# Patient Record
Sex: Male | Born: 1950
Health system: Southern US, Community
[De-identification: ages and names within clinical notes are randomized; demographics above are authoritative.]

## PROBLEM LIST (undated history)

## (undated) DIAGNOSIS — S88919A Complete traumatic amputation of unspecified lower leg, level unspecified, initial encounter: Secondary | ICD-10-CM

## (undated) DIAGNOSIS — E119 Type 2 diabetes mellitus without complications: Secondary | ICD-10-CM

## (undated) DIAGNOSIS — I629 Nontraumatic intracranial hemorrhage, unspecified: Secondary | ICD-10-CM

## (undated) DIAGNOSIS — Z89512 Acquired absence of left leg below knee: Secondary | ICD-10-CM

## (undated) DIAGNOSIS — I255 Ischemic cardiomyopathy: Secondary | ICD-10-CM

## (undated) DIAGNOSIS — N529 Male erectile dysfunction, unspecified: Secondary | ICD-10-CM

## (undated) DIAGNOSIS — I739 Peripheral vascular disease, unspecified: Secondary | ICD-10-CM

## (undated) DIAGNOSIS — I70211 Atherosclerosis of native arteries of extremities with intermittent claudication, right leg: Secondary | ICD-10-CM

## (undated) DIAGNOSIS — G546 Phantom limb syndrome with pain: Secondary | ICD-10-CM

## (undated) DIAGNOSIS — N183 Chronic kidney disease, stage 3 unspecified: Secondary | ICD-10-CM

## (undated) DIAGNOSIS — E785 Hyperlipidemia, unspecified: Secondary | ICD-10-CM

## (undated) DIAGNOSIS — K219 Gastro-esophageal reflux disease without esophagitis: Secondary | ICD-10-CM

## (undated) DIAGNOSIS — I10 Essential (primary) hypertension: Secondary | ICD-10-CM

## (undated) DIAGNOSIS — J449 Chronic obstructive pulmonary disease, unspecified: Secondary | ICD-10-CM

## (undated) DIAGNOSIS — I5042 Chronic combined systolic (congestive) and diastolic (congestive) heart failure: Secondary | ICD-10-CM

## (undated) DIAGNOSIS — E1122 Type 2 diabetes mellitus with diabetic chronic kidney disease: Secondary | ICD-10-CM

## (undated) DIAGNOSIS — F32A Depression, unspecified: Secondary | ICD-10-CM

## (undated) DIAGNOSIS — G709 Myoneural disorder, unspecified: Secondary | ICD-10-CM

## (undated) DIAGNOSIS — E1142 Type 2 diabetes mellitus with diabetic polyneuropathy: Secondary | ICD-10-CM

## (undated) DIAGNOSIS — I2510 Atherosclerotic heart disease of native coronary artery without angina pectoris: Secondary | ICD-10-CM

## (undated) HISTORY — DX: Chronic combined systolic (congestive) and diastolic (congestive) heart failure: I50.42

## (undated) HISTORY — DX: Ischemic cardiomyopathy: I25.5

## (undated) HISTORY — DX: Male erectile dysfunction, unspecified: N52.9

## (undated) HISTORY — DX: Essential (primary) hypertension: I10

## (undated) HISTORY — DX: Type 2 diabetes mellitus without complications: E11.9

## (undated) HISTORY — DX: Hyperlipidemia, unspecified: E78.5

## (undated) HISTORY — PX: BELOW KNEE LEG AMPUTATION: SUR23

## (undated) HISTORY — DX: Chronic kidney disease, stage 3 unspecified: N18.30

## (undated) HISTORY — DX: Chronic kidney disease, stage 3 (moderate): N18.3

## (undated) HISTORY — DX: Atherosclerotic heart disease of native coronary artery without angina pectoris: I25.10

## (undated) HISTORY — PX: OTHER SURGICAL HISTORY: SHX169

---
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
## 2003-12-21 ENCOUNTER — Other Ambulatory Visit: Payer: Self-pay

## 2007-09-29 ENCOUNTER — Emergency Department: Payer: Self-pay | Admitting: Emergency Medicine

## 2007-10-11 ENCOUNTER — Emergency Department: Payer: Self-pay | Admitting: Emergency Medicine

## 2009-12-23 ENCOUNTER — Emergency Department: Payer: Self-pay

## 2009-12-31 ENCOUNTER — Emergency Department: Payer: Self-pay | Admitting: Emergency Medicine

## 2010-01-11 ENCOUNTER — Ambulatory Visit: Payer: Self-pay | Admitting: Cardiovascular Disease

## 2010-01-11 ENCOUNTER — Inpatient Hospital Stay: Payer: Self-pay | Admitting: Student

## 2010-01-29 ENCOUNTER — Ambulatory Visit: Payer: Self-pay | Admitting: Cardiovascular Disease

## 2010-01-29 ENCOUNTER — Observation Stay: Payer: Self-pay | Admitting: Internal Medicine

## 2010-01-29 LAB — CONVERTED CEMR LAB
ALT: 21 units/L
AST: 11 units/L
Albumin: 3.8 g/dL
Alkaline Phosphatase: 77 units/L
BUN: 19 mg/dL
CO2: 26 meq/L
Calcium: 9 mg/dL
Chloride: 103 meq/L
Creatinine, Ser: 0.88 mg/dL
Glucose, Bld: 152 mg/dL
HCT: 45 %
Hemoglobin: 15 g/dL
MCV: 94 fL
Platelets: 264 10*3/uL
Potassium: 4.2 meq/L
RBC: 4.78 M/uL
RDW: 12.9 %
Sodium: 139 meq/L
Total Bilirubin: 0.3 mg/dL
Total Protein: 7.5 g/dL
WBC: 10.4 10*3/uL

## 2010-01-30 ENCOUNTER — Encounter: Payer: Self-pay | Admitting: Cardiovascular Disease

## 2010-02-08 ENCOUNTER — Encounter: Payer: Self-pay | Admitting: Cardiovascular Disease

## 2010-02-10 ENCOUNTER — Ambulatory Visit: Payer: Self-pay | Admitting: Cardiovascular Disease

## 2010-02-10 DIAGNOSIS — E785 Hyperlipidemia, unspecified: Secondary | ICD-10-CM | POA: Insufficient documentation

## 2010-02-10 DIAGNOSIS — I10 Essential (primary) hypertension: Secondary | ICD-10-CM | POA: Insufficient documentation

## 2010-02-10 DIAGNOSIS — I2510 Atherosclerotic heart disease of native coronary artery without angina pectoris: Secondary | ICD-10-CM | POA: Insufficient documentation

## 2010-02-15 ENCOUNTER — Telehealth: Payer: Self-pay | Admitting: Cardiovascular Disease

## 2010-02-27 ENCOUNTER — Encounter: Payer: Self-pay | Admitting: Cardiovascular Disease

## 2010-03-23 ENCOUNTER — Telehealth: Payer: Self-pay | Admitting: Cardiovascular Disease

## 2010-04-14 ENCOUNTER — Telehealth: Payer: Self-pay | Admitting: Cardiovascular Disease

## 2010-04-21 ENCOUNTER — Telehealth: Payer: Self-pay | Admitting: Cardiovascular Disease

## 2010-04-24 ENCOUNTER — Encounter: Payer: Self-pay | Admitting: Cardiovascular Disease

## 2010-06-05 ENCOUNTER — Ambulatory Visit: Payer: Self-pay | Admitting: Cardiovascular Disease

## 2010-06-05 DIAGNOSIS — E1165 Type 2 diabetes mellitus with hyperglycemia: Secondary | ICD-10-CM | POA: Insufficient documentation

## 2010-06-05 DIAGNOSIS — E1169 Type 2 diabetes mellitus with other specified complication: Secondary | ICD-10-CM | POA: Insufficient documentation

## 2010-06-05 DIAGNOSIS — E118 Type 2 diabetes mellitus with unspecified complications: Secondary | ICD-10-CM

## 2010-06-07 ENCOUNTER — Telehealth (INDEPENDENT_AMBULATORY_CARE_PROVIDER_SITE_OTHER): Payer: Self-pay | Admitting: *Deleted

## 2010-08-09 ENCOUNTER — Telehealth: Payer: Self-pay | Admitting: Cardiovascular Disease

## 2010-09-05 NOTE — Medication Information (Signed)
Summary: Tax adviser   Imported By: West Carbo 02/27/2010 09:30:59  _____________________________________________________________________  External Attachment:    Type:   Image     Comment:   External Document

## 2010-09-05 NOTE — Progress Notes (Signed)
Summary: EFFIENT  Phone Note Other Incoming Call back at [phone]   Caller: ISAAC [facility] Summary of Call: REF # [id number] IN REFERENCE TO THE PT REQUESTING EFFIENT Initial call taken by: Harlon Flor,  March 23, 2010 1:57 PM  Follow-up for Phone Call        [facility] needs proof of income and #of people in house hold to finish paperwork. Follow-up by: Benedict Needy, RN,  March 24, 2010 11:26 AM     Appended Document: EFFIENT spoke to rebecca pt's daughter she will fax the rest of the form. will also leave sample of effient at desk for pt to pick up   Clinical Lists Changes  Medications: Changed medication from PLAVIX 75 MG TABS (CLOPIDOGREL BISULFATE) 1 tab once daily to EFFIENT 10 MG TABS (PRASUGREL HCL) Take one tablet by mouth daily - Signed Rx of EFFIENT 10 MG TABS (PRASUGREL HCL) Take one tablet by mouth daily;  #7 x 0;  Signed;  Entered by: Benedict Needy, RN;  Authorized by: Dossie Arbour MD;  Method used: Samples Given    Prescriptions: EFFIENT 10 MG TABS (PRASUGREL HCL) Take one tablet by mouth daily  #7 x 0   Entered by:   Benedict Needy, RN   Authorized by:   Dossie Arbour MD   Signed by:   Benedict Needy, RN on 03/24/2010   Method used:   Samples Given   RxID:   [id number]

## 2010-09-05 NOTE — Progress Notes (Signed)
  Phone Note Other Incoming   Request: Send information Summary of Call: Request for records received from [facility]. Request forwarded to [facility].     

## 2010-09-05 NOTE — Assessment & Plan Note (Signed)
Summary: ROV/AMD   Visit Type:  Follow-up Primary Provider:  Dr. Lacie Scotts  CC:  c/o shortness of breath..  History of Present Illness: 60 year old gentleman with a history of coronary artery disease, cardiac catheterization by myself showing severe two-vessel disease, severe stenosis of the proximal to mid LAD, severe ostial O1 and O2 disease, moderate to severe mid left circumflex disease that appeared aneurysmal, moderate OM 2 disease ejection fraction 40% who had a PCI of the LAD on January 11, 2010, who used to smoke 4 packs a day who stopped earlier in 2011, history of diabetes, hyperlipidemia who presents for routine followup.  Overall he reports that he is doing well. He denies any chest pain. He has quit smoking earlier in the year and although it has been difficult, he has abstained. He does have some shortness of breath and Dr. Lacie Scotts has started him on inhalers which he believes has helped. He is now taking a cholesterol pill or a beta blocker though he is uncertain why these are not on his list.  he does report that after stopping smoking, his weight has increased 30 pounds.  EKG shows normal sinus rhythm with ST  and T wave abnormalities noted in V4 to V6, and inferior leads  Current Medications (verified): 1)  Lisinopril-Hydrochlorothiazide 20-25 Mg Tabs (Lisinopril-Hydrochlorothiazide) .[name].. 1 Tab Once Daily 2)  Imdur 30 Mg Xr24h-Tab (Isosorbide Mononitrate) .[name].. 1 Tab Once Daily 3)  Glipizide 5 Mg Xr24h-Tab (Glipizide) .[name].. 1 Tab Two Times A Day 4)  Effient 10 Mg Tabs (Prasugrel Hcl) .... Take One Tablet By Mouth Daily 5)  Metformin Hcl 1000 Mg Tabs (Metformin Hcl) .[name].. 1 Tab Two Times A Day 6)  Nitrostat 0.4 Mg Subl (Nitroglycerin) .[name].. 1 Tablet Under Tongue At Onset of Chest Pain; You May Repeat Every 5 Minutes For Up To 3 Doses. 7)  Spiriva Handihaler 18 Mcg Caps (Tiotropium Bromide Monohydrate) .... Inhale One Dose Everyday 8)  Ventolin Hfa 108 (90 Base) Mcg/act Aers  (Albuterol Sulfate) .... Inhale Two Puffs Every 4 To 6 Hours As Needed 9)  Qvar 80 Mcg/act Aers (Beclomethasone Dipropionate) .... Inhale Two Puffs Two Times A Day  Allergies (verified): No Known Drug Allergies  Review of Systems       The patient complains of dyspnea on exertion.  The patient denies fever, weight loss, weight gain, vision loss, decreased hearing, hoarseness, chest pain, syncope, peripheral edema, prolonged cough, abdominal pain, incontinence, muscle weakness, depression, and enlarged lymph nodes.    Vital Signs:  Patient profile:   60 year old male Height:      72 inches Weight:      234 pounds BMI:     31.85 Pulse rate:   88 / minute BP sitting:   168 / 91  (left arm) Cuff size:   regular  Vitals Entered By: Bishop Dublin, CMA (June 05, 2010 11:12 AM)  Physical Exam  General:  Well developed, well nourished, in no acute distress. Head:  normocephalic and atraumatic Neck:  Neck supple, no JVD. No masses, thyromegaly or abnormal cervical nodes. Lungs:  Clear bilaterally to auscultation and percussion. Heart:  Non-displaced PMI, chest non-tender; regular rate and rhythm, S1, S2 without murmurs, rubs or gallops. Carotid upstroke normal, no bruit Pedals pulses diminished. No edema, no varicosities. Abdomen:  Bowel sounds positive; abdomen soft and non-tender without masses. Obese. Msk:  Back normal, normal gait. Muscle strength and tone normal. Pulses:  decreased lower extremity pulses Extremities:  No clubbing or cyanosis. Neurologic:  Alert and oriented x 3. Skin:  Intact without lesions or rashes. Psych:  Normal affect.   Impression & Recommendations:  Problem # 1:  CAD (ICD-414.00) history of severe CAD with stent placement in his LAD, currently with no anginal symptoms We'll continue aggressive lipid management. We'll restart his simvastatin 40 mg daily. Also given him a prescription for Coreg 12.5 mg b.i.d.  The following medications were removed  from the medication list:    Coreg 25 Mg Tabs (Carvedilol) .[name]... 1 two times a day    Aspirin 325 Mg Tabs (Aspirin) .[name]... 1 tab once daily His updated medication list for this problem includes:    Lisinopril-hydrochlorothiazide 20-25 Mg Tabs (Lisinopril-hydrochlorothiazide) .[name]... 1 tab once daily    Imdur 30 Mg Xr24h-tab (Isosorbide mononitrate) .[name]... 1 tab once daily    Effient 10 Mg Tabs (Prasugrel hcl) .[name]... Take one tablet by mouth daily    Nitrostat 0.4 Mg Subl (Nitroglycerin) .[name]... 1 tablet under tongue at onset of chest pain; you may repeat every 5 minutes for up to 3 doses.    Carvedilol 12.5 Mg Tabs (Carvedilol) .[name]... Take one tablet by mouth twice a day  Problem # 2:  HYPERTENSION, BENIGN (ICD-401.1) Blood pressure on recheck continues to be elevated with systolics in the 150s. We will readd Coreg two times a day.  The following medications were removed from the medication list:    Coreg 25 Mg Tabs (Carvedilol) .[name]... 1 two times a day    Aspirin 325 Mg Tabs (Aspirin) .[name]... 1 tab once daily His updated medication list for this problem includes:    Lisinopril-hydrochlorothiazide 20-25 Mg Tabs (Lisinopril-hydrochlorothiazide) .[name]... 1 tab once daily    Carvedilol 12.5 Mg Tabs (Carvedilol) .[name]... Take one tablet by mouth twice a day  Problem # 3:  HYPERLIPIDEMIA-MIXED (ICD-272.4) restart simvastatin 40 mg daily. If his cholesterol in several months time is not at goal less than 70, we could change him to Lipitor when it goes generic.  The following medications were removed from the medication list:    Simvastatin 40 Mg Tabs (Simvastatin) .[name]... 1 tab once daily His updated medication list for this problem includes:    Simvastatin 40 Mg Tabs (Simvastatin) .[name]... Take one tablet by mouth daily at bedtime  Problem # 4:  DIAB W/O COMP TYPE II/UNS NOT STATED UNCNTRL (ICD-250.00) His sugars are poorly controlled which is dietary non-discretion. We have encouraged him to monitor his diet  more closely and avoid high carbohydrate foods.  The following medications were removed from the medication list:    Aspirin 325 Mg Tabs (Aspirin) .[name]... 1 tab once daily His updated medication list for this problem includes:    Lisinopril-hydrochlorothiazide 20-25 Mg Tabs (Lisinopril-hydrochlorothiazide) .[name]... 1 tab once daily    Glipizide 5 Mg Xr24h-tab (Glipizide) .[name]... 1 tab two times a day    Metformin Hcl 1000 Mg Tabs (Metformin hcl) .[name]... 1 tab two times a day  Patient Instructions: 1)  Your physician has recommended you make the following change in your medication: START coreg two times a day and simvastatin daily 2)  Your physician wants you to follow-up in:   6 months You will receive a reminder letter in the mail two months in advance. If you don't receive a letter, please call our office to schedule the follow-up appointment. Prescriptions: CARVEDILOL 12.5 MG TABS (CARVEDILOL) Take one tablet by mouth twice a day  #60 x 6   Entered by:   Benedict Needy, RN   Authorized  by:   Dossie Arbour MD   Signed by:   Benedict Needy, RN on 06/05/2010   Method used:   Print then Give to Patient   RxID:   [id number] SIMVASTATIN 40 MG TABS (SIMVASTATIN) Take one tablet by mouth daily at bedtime  #30 x 6   Entered by:   Benedict Needy, RN   Authorized by:   Dossie Arbour MD   Signed by:   Benedict Needy, RN on 06/05/2010   Method used:   Print then Give to Patient   RxID:   [id number]

## 2010-09-05 NOTE — Letter (Signed)
Summary: [facility] - No-Show Fax  [facility] - No-Show Fax   Imported By: Marylou Mccoy 05/10/2010 11:29:44  _____________________________________________________________________  External Attachment:    Type:   Image     Comment:   External Document

## 2010-09-05 NOTE — Progress Notes (Signed)
Summary: FYI  Phone Note Call from Patient Call back at [facility] Phone [phone]   Caller: Daughter Call For: [facility] Summary of Call: PT ASSISTANCE IS GOING TO SHIP A 4 MONTH SUPPLY OF EFFIENT TO OUR OFFICE FOR THE PT Initial call taken by: Harlon Flor,  April 14, 2010 8:52 AM

## 2010-09-05 NOTE — Letter (Signed)
Summary: Discharge Summary  Discharge Summary   Imported By: Park Breed 02/01/2010 08:57:01  _____________________________________________________________________  External Attachment:    Type:   Image     Comment:   External Document

## 2010-09-05 NOTE — Progress Notes (Signed)
Summary: Effient  Phone Note Outgoing Call   Call placed by: Benedict Needy, RN,  April 21, 2010 12:51 PM Call placed to: Patient Summary of Call: Called spoke to pt's daughter patient assistance effient arrived Initial call taken by: Benedict Needy, RN,  April 21, 2010 12:52 PM    Prescriptions: EFFIENT 10 MG TABS (PRASUGREL HCL) Take one tablet by mouth daily  #120 x 0   Entered and Authorized by:   Benedict Needy, RN   Signed by:   Benedict Needy, RN on 04/21/2010   Method used:   Historical   RxID:   [id number]

## 2010-09-05 NOTE — Assessment & Plan Note (Signed)
Summary: F/U [facility]   Visit Type:  Initial Consult Primary Provider:  [facility]  CC:  "Doing well"..  History of Present Illness: 60 year old gentleman with a history of coronary artery disease, cardiac catheterization by myself sewing severe two-vessel disease, severe stenosis of the proximal to mid LAD, severe ostial V1 and V2 disease, moderate to severe mid left circumflex disease that appeared aneurysmal, moderate OM 2 disease ejection fraction 40% who had a PCI of the LAD on January 11, 2010, who used to smoke 4 packs a day who stopped one month ago, history of diabetes, hyperlipidemia who presents for routine followup.  He reports no episodes of chest pain. He has not had to take any nitroglycerin. He is an active. He's been eating more to make up for his smoking cessation. He does not even carry his nitroglycerin with him as he has not had any chest discomfort. He has not been checking his sugars as he does not have a monitor with a right test strips. He denies any shortness of breath, lower extremity edema, lightheadedness or dizziness.  EKG shows normal sinus rhythm with ST abnormalities noted in V5 and V6, T wave abnormality in the anterolateral leads.  Current Medications (verified): 1)  Coreg 25 Mg Tabs (Carvedilol) .[name].. 1 Two Times A Day 2)  Lisinopril-Hydrochlorothiazide 20-25 Mg Tabs (Lisinopril-Hydrochlorothiazide) .[name].. 1 Tab Once Daily 3)  Imdur 30 Mg Xr24h-Tab (Isosorbide Mononitrate) .[name].. 1 Tab Once Daily 4)  Glipizide 5 Mg Xr24h-Tab (Glipizide) .[name].. 1 Tab Two Times A Day 5)  Aspirin 325 Mg Tabs (Aspirin) .[name].. 1 Tab Once Daily 6)  Plavix 75 Mg Tabs (Clopidogrel Bisulfate) .[name].. 1 Tab Once Daily 7)  Metformin Hcl 1000 Mg Tabs (Metformin Hcl) .[name].. 1 Tab Two Times A Day 8)  Simvastatin 40 Mg Tabs (Simvastatin) .[name].. 1 Tab Once Daily  Allergies (verified): No Known Drug Allergies  Past History:  Past Medical History: Diabetes Type 2 Hyperlipidemia Hypertension CAD mild non-Q-wave  MI  Past Surgical History: CAD: Stent to the LAD  Social History: Tobacco Use - Former. quit in June 2011 Alcohol Use - no Drug Use - no Single   Review of Systems  The patient denies fatigue, malaise, fever, weight gain/loss, vision loss, decreased hearing, hoarseness, chest pain, palpitations, shortness of breath, prolonged cough, wheezing, sleep apnea, coughing up blood, abdominal pain, blood in stool, nausea, vomiting, diarrhea, heartburn, incontinence, blood in urine, muscle weakness, joint pain, leg swelling, rash, skin lesions, headache, fainting, dizziness, depression, anxiety, enlarged lymph nodes, easy bruising or bleeding, environmental allergies, weight loss, weight gain, syncope, and dyspnea on exertion.    Vital Signs:  Patient profile:   60 year old male Height:      72 inches Weight:      211 pounds BMI:     28.72 Pulse rate:   93 / minute BP sitting:   151 / 96  (left arm) Cuff size:   regular  Vitals Entered By: Bishop Dublin, CMA (February 10, 2010 11:33 AM)  Physical Exam  General:  Well developed, well nourished, in no acute distress. Head:  normocephalic and atraumatic Neck:  Neck supple, no JVD. No masses, thyromegaly or abnormal cervical nodes. Lungs:  Clear bilaterally to auscultation and percussion. Heart:  Non-displaced PMI, chest non-tender; regular rate and rhythm, S1, S2 without murmurs, rubs or gallops. Carotid upstroke normal, no bruit Pedals pulses diminished. No edema, no varicosities. Abdomen:  Bowel sounds positive; abdomen soft and non-tender without masses Msk:  Back normal,  normal gait. Muscle strength and tone normal. Pulses:  decreased lower extremity pulses Extremities:  No clubbing or cyanosis. Neurologic:  Alert and oriented x 3. Skin:  Intact without lesions or rashes. Psych:  Normal affect.   Impression & Recommendations:  Problem # 1:  CAD (ICD-414.00) severe coronary artery disease of his LAD, left circumflex and diagonal as  well as marginal branches. Stent placed to his LAD one month ago. We have given him some samples of Plavix as he was due to run out. We are very happy that he has been able to stop smoking and have encouraged him to refrain from restarting. We have also encouraged him to take his nitroglycerin with him. If he has any additional episodes of chest pain requiring nitroglycerin, I have suggested that he call me.  His updated medication list for this problem includes:    Coreg 25 Mg Tabs (Carvedilol) .[name]... 1 two times a day    Lisinopril-hydrochlorothiazide 20-25 Mg Tabs (Lisinopril-hydrochlorothiazide) .[name]... 1 tab once daily    Imdur 30 Mg Xr24h-tab (Isosorbide mononitrate) .[name]... 1 tab once daily    Aspirin 325 Mg Tabs (Aspirin) .[name]... 1 tab once daily    Plavix 75 Mg Tabs (Clopidogrel bisulfate) .[name]... 1 tab once daily  Orders: EKG w/ Interpretation (93000)  Problem # 2:  HYPERLIPIDEMIA-MIXED (ICD-272.4) He is due to follow up at [facility] primary care. We'll try to obtain a lipid panel from their clinic when it is Checked.  His updated medication list for this problem includes:    Simvastatin 40 Mg Tabs (Simvastatin) .[name]... 1 tab once daily  Problem # 3:  HYPERTENSION, BENIGN (ICD-401.1) Blood pressure on recheck was 140 systolic over 75. We have not made any medication changes at this time.  His updated medication list for this problem includes:    Coreg 25 Mg Tabs (Carvedilol) .[name]... 1 two times a day    Lisinopril-hydrochlorothiazide 20-25 Mg Tabs (Lisinopril-hydrochlorothiazide) .[name]... 1 tab once daily    Aspirin 325 Mg Tabs (Aspirin) .[name]... 1 tab once daily

## 2010-09-05 NOTE — Progress Notes (Signed)
Summary: RX faxed  Phone Note Call from Patient   Caller: Daughter Reason for Call: Refill Medication Summary of Call: PT NEEDS PRES FOR NITRO FAXED TO [facility] [address]  HER PHONE NUMBER [phone] Initial call taken by: Park Breed,  February 15, 2010 11:08 AM  Follow-up for Phone Call        The pt's daughter is aware the RX has been called in. Follow-up by: Sherri Rad, RN, BSN,  February 15, 2010 5:56 PM    New/Updated Medications: NITROSTAT 0.4 MG SUBL (NITROGLYCERIN) 1 tablet under tongue at onset of chest pain; you may repeat every 5 minutes for up to 3 doses. Prescriptions: NITROSTAT 0.4 MG SUBL (NITROGLYCERIN) 1 tablet under tongue at onset of chest pain; you may repeat every 5 minutes for up to 3 doses.  #25 x 3   Entered by:   Sherri Rad, RN, BSN   Authorized by:   Dossie Arbour MD   Signed by:   Sherri Rad, RN, BSN on 02/15/2010   Method used:   Electronically to        [facility] [address] (retail)       [address], [address]       [place], [place]  [address]       Ph: [phone]       Fax: [phone]   RxID:   [id number]

## 2010-09-07 NOTE — Progress Notes (Signed)
  Phone Note Call from Patient   Caller: Daughter Details for Reason: Refills Summary of Call: Need a refill called in for lisinopril and effient to [facility] [address]. Initial call taken by: Bishop Dublin, CMA,  August 09, 2010 3:50 PM    Prescriptions: EFFIENT 10 MG TABS (PRASUGREL HCL) Take one tablet by mouth daily  #30 x 4   Entered by:   Bishop Dublin, CMA   Authorized by:   Dossie Arbour MD   Signed by:   Bishop Dublin, CMA on 08/09/2010   Method used:   Electronically to        [facility] [address] (retail)       [address], [address]       [place], [place]  [address]       Ph: [phone]       Fax: [phone]   RxID:   [id number] LISINOPRIL-HYDROCHLOROTHIAZIDE 20-25 MG TABS (LISINOPRIL-HYDROCHLOROTHIAZIDE) 1 tab once daily  #30 x 4   Entered by:   Bishop Dublin, CMA   Authorized by:   Dossie Arbour MD   Signed by:   Bishop Dublin, CMA on 08/09/2010   Method used:   Electronically to        [facility] [address] (retail)       [address], [address]       [place], [place]  [address]       Ph: [phone]       Fax: [phone]   RxID:   [id number]

## 2011-01-04 ENCOUNTER — Other Ambulatory Visit: Payer: Self-pay | Admitting: Cardiovascular Disease

## 2011-02-20 ENCOUNTER — Encounter: Payer: Self-pay | Admitting: Cardiovascular Disease

## 2011-03-19 ENCOUNTER — Encounter: Payer: Self-pay | Admitting: Cardiovascular Disease

## 2011-03-19 ENCOUNTER — Ambulatory Visit (INDEPENDENT_AMBULATORY_CARE_PROVIDER_SITE_OTHER): Payer: Medicaid Other | Admitting: Cardiovascular Disease

## 2011-03-19 VITALS — BP 153/96 | HR 86 | Ht 72.0 in | Wt 242.0 lb

## 2011-03-19 DIAGNOSIS — E119 Type 2 diabetes mellitus without complications: Secondary | ICD-10-CM

## 2011-03-19 DIAGNOSIS — E785 Hyperlipidemia, unspecified: Secondary | ICD-10-CM

## 2011-03-19 DIAGNOSIS — I10 Essential (primary) hypertension: Secondary | ICD-10-CM

## 2011-03-19 DIAGNOSIS — I2510 Atherosclerotic heart disease of native coronary artery without angina pectoris: Secondary | ICD-10-CM

## 2011-03-19 NOTE — Assessment & Plan Note (Signed)
Cholesterol is at goal on the current lipid regimen. No changes to the medications were made.  

## 2011-03-19 NOTE — Assessment & Plan Note (Signed)
We have encouraged continued exercise, careful diet management in an effort to lose weight. 

## 2011-03-19 NOTE — Assessment & Plan Note (Signed)
Repeat blood pressure was improved. We have asked him to monitor his blood pressure at home. He admits to high salt intake recently.

## 2011-03-19 NOTE — Progress Notes (Signed)
Patient ID: Danny Scrape Sr., male    DOB: [birth year], 60 y.o.   MRN: [id number]  HPI Comments: 60 year old gentleman with a history of coronary artery disease, cardiac catheterization showing severe two-vessel disease, severe stenosis of the proximal to mid LAD, severe ostial O1 and O2 disease, moderate to severe mid left circumflex disease that appeared aneurysmal, moderate OM 2 disease ejection fraction 40% who had a PCI of the LAD on January 11, 2010, who used to smoke 4 packs a day who stopped earlier in 2011, history of diabetes, hyperlipidemia who presents for routine followup.   Overall he reports that he is doing well. He denies any chest pain. He has quit smoking earlier in the year. Shortness of breath is better on inhaler.   He reports having an echocardiogram done with Dr. Carron Brazen office. Results are not available to [place].   EKG shows normal sinus rhythm with Rate of 91 beats per minute, consider old interseptal infarct, T-wave abnormality in lead I and aVL    Outpatient Encounter Prescriptions as of 03/19/2011  Medication Sig Dispense Refill  . albuterol (VENTOLIN HFA) 108 (90 BASE) MCG/ACT inhaler Inhale 2 puffs into the lungs. Every 4 to 6 hours as needed       . aspirin 81 MG tablet Take 81 mg by mouth daily.        . beclomethasone (QVAR) 80 MCG/ACT inhaler Inhale 2 puffs into the lungs 2 (two) times daily.        . carvedilol (COREG) 25 MG tablet Take 25 mg by mouth 2 (two) times daily with a meal.        . clonazePAM (KLONOPIN) 0.5 MG tablet Take 0.5 mg by mouth 2 (two) times daily as needed.        . colesevelam (WELCHOL) 625 MG tablet Take 1,875 mg by mouth 2 (two) times daily with a meal.        . glyBURIDE-metformin (GLUCOVANCE) 2.5-500 MG per tablet Take 1 tablet by mouth 2 (two) times daily with a meal.        . isosorbide mononitrate (IMDUR) 30 MG 24 hr tablet Take 30 mg by mouth daily.        [name] lisinopril-hydrochlorothiazide (PRINZIDE,ZESTORETIC) 20-25 MG per tablet  Take 1 tablet by mouth daily.        . nitroGLYCERIN (NITROSTAT) 0.4 MG SL tablet Place 0.4 mg under the tongue every 5 (five) minutes as needed.        . prasugrel (EFFIENT) 10 MG TABS Take by mouth daily.        . ranitidine (ZANTAC) 150 MG tablet Take 150 mg by mouth 2 (two) times daily.        . simvastatin (ZOCOR) 40 MG tablet Take 40 mg by mouth at bedtime.        [name] tiotropium (SPIRIVA HANDIHALER) 18 MCG inhalation capsule Place 18 mcg into inhaler and inhale daily.        . traMADol-acetaminophen (ULTRACET) 37.5-325 MG per tablet Take 1 tablet by mouth every 4 (four) hours as needed.           Review of Systems  Constitutional: Negative.   HENT: Negative.   Eyes: Negative.   Respiratory: Negative.   Cardiovascular: Negative.   Gastrointestinal: Negative.   Musculoskeletal: Negative.   Skin: Negative.   Neurological: Negative.   Hematological: Negative.   Psychiatric/Behavioral: Negative.   All other systems reviewed and are negative.    BP 153/96  Pulse 86  Ht 6' (1.829 m)  Wt 242 lb (109.77 kg)  BMI 32.82 kg/m2 Repeat blood pressure 140/85  Physical Exam  Nursing note and vitals reviewed. Constitutional: He is oriented to person, place, and time. He appears well-developed and well-nourished.       obese  HENT:  Head: Normocephalic.  Nose: Nose normal.  Mouth/Throat: Oropharynx is clear and moist.  Eyes: Conjunctivae are normal. Pupils are equal, round, and reactive to light.  Neck: Normal range of motion. Neck supple. No JVD present.  Cardiovascular: Normal rate, regular rhythm, S1 normal, S2 normal, normal heart sounds and intact distal pulses.  Exam reveals no gallop and no friction rub.   No murmur heard. Pulmonary/Chest: Effort normal and breath sounds normal. No respiratory distress. He has no wheezes. He has no rales. He exhibits no tenderness.  Abdominal: Soft. Bowel sounds are normal. He exhibits no distension. There is no tenderness.  Musculoskeletal:  Normal range of motion. He exhibits no edema and no tenderness.  Lymphadenopathy:    He has no cervical adenopathy.  Neurological: He is alert and oriented to person, place, and time. Coordination normal.  Skin: Skin is warm and dry. No rash noted. No erythema.  Psychiatric: He has a normal mood and affect. His behavior is normal. Judgment and thought content normal.           Assessment and Plan

## 2011-03-19 NOTE — Assessment & Plan Note (Signed)
Currently with no symptoms of angina. No further workup at this time. Continue current medication regimen. 

## 2011-03-19 NOTE — Patient Instructions (Signed)
You are doing well. No medication changes were made. Watch out!   Not too much bacon or salt! Please call us if you have new issues that need to be addressed before your next appt.  We will call you for a follow up Appt. In 6 months

## 2011-04-07 ENCOUNTER — Other Ambulatory Visit: Payer: Self-pay | Admitting: Cardiovascular Disease

## 2011-04-11 NOTE — Telephone Encounter (Signed)
Patient needs a cholesterol check ASAP!

## 2011-05-31 ENCOUNTER — Emergency Department: Payer: Self-pay | Admitting: Emergency Medicine

## 2011-06-07 ENCOUNTER — Other Ambulatory Visit: Payer: Self-pay | Admitting: Cardiovascular Disease

## 2011-06-08 ENCOUNTER — Telehealth: Payer: Self-pay

## 2011-06-08 MED ORDER — SIMVASTATIN 40 MG PO TABS: 40.0000 mg | ORAL_TABLET | Freq: Every day | ORAL | Status: DC

## 2011-06-08 NOTE — Telephone Encounter (Signed)
Refill sent for simvastatin 40 mg take one tablet daily at bedtime.

## 2011-10-17 ENCOUNTER — Ambulatory Visit (INDEPENDENT_AMBULATORY_CARE_PROVIDER_SITE_OTHER): Payer: Medicaid Other | Admitting: Cardiovascular Disease

## 2011-10-17 ENCOUNTER — Encounter: Payer: Self-pay | Admitting: Cardiovascular Disease

## 2011-10-17 VITALS — BP 152/87 | HR 91 | Ht 72.0 in | Wt 250.0 lb

## 2011-10-17 DIAGNOSIS — I2510 Atherosclerotic heart disease of native coronary artery without angina pectoris: Secondary | ICD-10-CM

## 2011-10-17 DIAGNOSIS — E785 Hyperlipidemia, unspecified: Secondary | ICD-10-CM

## 2011-10-17 DIAGNOSIS — R079 Chest pain, unspecified: Secondary | ICD-10-CM

## 2011-10-17 DIAGNOSIS — I10 Essential (primary) hypertension: Secondary | ICD-10-CM

## 2011-10-17 MED ORDER — CLOPIDOGREL BISULFATE 75 MG PO TABS: 75.0000 mg | ORAL_TABLET | Freq: Every day | ORAL | Status: AC

## 2011-10-17 MED ORDER — ISOSORBIDE MONONITRATE ER 30 MG PO TB24: 30.0000 mg | ORAL_TABLET | Freq: Two times a day (BID) | ORAL | Status: DC

## 2011-10-17 MED ORDER — FUROSEMIDE 20 MG PO TABS: ORAL_TABLET | ORAL | Status: DC

## 2011-10-17 NOTE — Assessment & Plan Note (Signed)
We will change him from effient to Plavix given the higher bleeding risk on the former. Continue aggressive cholesterol management. No further testing.

## 2011-10-17 NOTE — Assessment & Plan Note (Signed)
Continue aggressive cholesterol management. Goal LDL less than 70 

## 2011-10-17 NOTE — Assessment & Plan Note (Addendum)
Blood pressure is elevated today. He does report good blood pressure at home. We have asked him to monitor this for [place].

## 2011-10-17 NOTE — Assessment & Plan Note (Signed)
Recent episode of chest pain requiring nitroglycerin. Etiology is uncertain. He does have significant stress and he believes symptoms started after a fight with his nephew. We have asked him to call our office if he has additional episodes of chest pain. We would order a Myoview.

## 2011-10-17 NOTE — Progress Notes (Signed)
Patient ID: Danny Scrape Sr., male    DOB: 02-Jun-1951, 61 y.o.   MRN: [id number]  HPI Comments: 61 year old gentleman with a history of coronary artery disease, cardiac catheterization showing severe two-vessel disease, severe stenosis of the proximal to mid LAD, severe ostial O1 and O2 disease, moderate to severe mid left circumflex disease that appeared aneurysmal, moderate OM 2 disease ejection fraction 40% who had a PCI of the LAD on January 11, 2010, who used to smoke 4 packs a day who stopped earlier in 2011, history of diabetes, hyperlipidemia who presents for routine followup.  He reports having chest pain this past weekend. He was arguing with his nephew and got "heated ". He took nitroglycerin and his symptoms seem to go away. Prior to that, he has not required nitroglycerin for symptoms. He has felt well Monday through Wednesday with no recurrent chest pain symptoms. He has had significant stress, reports that his mother has had recent thyroid surgery. He is otherwise active. He has quit smoking earlier in the year. Shortness of breath is better on inhaler.   He reports having an echocardiogram done with Dr. Carron Brazen office in the past. Results are not available to [place].   EKG shows normal sinus rhythm with Rate of 93 beats per minute,  T-wave abnormality in lead I and aVL as well as leads V5, V6 EKG done by EMTs that came to the patient's house last weekend showed no significant change   Outpatient Encounter Prescriptions as of 10/17/2011  Medication Sig Dispense Refill  . nitroGLYCERIN (NITROSTAT) 0.4 MG SL tablet Place 0.4 mg under the tongue every 5 (five) minutes as needed.        [name] albuterol (VENTOLIN HFA) 108 (90 BASE) MCG/ACT inhaler Inhale 2 puffs into the lungs. Every 4 to 6 hours as needed       . aspirin 81 MG tablet Take 81 mg by mouth daily.        . beclomethasone (QVAR) 80 MCG/ACT inhaler Inhale 2 puffs into the lungs 2 (two) times daily.        . carvedilol (COREG) 25 MG  tablet Take 25 mg by mouth 2 (two) times daily with a meal.        . clonazePAM (KLONOPIN) 0.5 MG tablet Take 0.5 mg by mouth 2 (two) times daily as needed.        . colesevelam (WELCHOL) 625 MG tablet Take 1,875 mg by mouth 2 (two) times daily with a meal.        . furosemide (LASIX) 20 MG tablet Take one tablet daily as needed for shortness of breath possibly on Monday, Wednesday and Friday.  30 tablet  1  . glyBURIDE-metformin (GLUCOVANCE) 2.5-500 MG per tablet Take 1 tablet by mouth 2 (two) times daily with a meal.        . lisinopril-hydrochlorothiazide (PRINZIDE,ZESTORETIC) 20-25 MG per tablet Take 1 tablet by mouth daily.        . ranitidine (ZANTAC) 150 MG tablet Take 150 mg by mouth 2 (two) times daily.        . simvastatin (ZOCOR) 40 MG tablet Take 1 tablet (40 mg total) by mouth at bedtime.  30 tablet  6  . tiotropium (SPIRIVA HANDIHALER) 18 MCG inhalation capsule Place 18 mcg into inhaler and inhale daily.        . traMADol-acetaminophen (ULTRACET) 37.5-325 MG per tablet Take 1 tablet by mouth every 4 (four) hours as needed.        [name]  isosorbide mononitrate (IMDUR) 30 MG 24 hr tablet Take 30 mg by mouth daily.        .  prasugrel (EFFIENT) 10 MG TABS Take by mouth daily.           Review of Systems  Constitutional: Negative.   HENT: Negative.   Eyes: Negative.   Respiratory: Negative.   Cardiovascular: Positive for chest pain.  Gastrointestinal: Negative.   Musculoskeletal: Negative.   Skin: Negative.   Neurological: Negative.   Hematological: Negative.   Psychiatric/Behavioral: Negative.   All other systems reviewed and are negative.    BP 152/87  Pulse 91  Ht 6' (1.829 m)  Wt 250 lb (113.399 kg)  BMI 33.91 kg/m2  Physical Exam  Nursing note and vitals reviewed. Constitutional: He is oriented to person, place, and time. He appears well-developed and well-nourished.       obese  HENT:  Head: Normocephalic.  Nose: Nose normal.  Mouth/Throat: Oropharynx is clear  and moist.  Eyes: Conjunctivae are normal. Pupils are equal, round, and reactive to light.  Neck: Normal range of motion. Neck supple. No JVD present.  Cardiovascular: Normal rate, regular rhythm, S1 normal, S2 normal, normal heart sounds and intact distal pulses.  Exam reveals no gallop and no friction rub.   No murmur heard. Pulmonary/Chest: Effort normal and breath sounds normal. No respiratory distress. He has no wheezes. He has no rales. He exhibits no tenderness.  Abdominal: Soft. Bowel sounds are normal. He exhibits no distension. There is no tenderness.  Musculoskeletal: Normal range of motion. He exhibits no edema and no tenderness.  Lymphadenopathy:    He has no cervical adenopathy.  Neurological: He is alert and oriented to person, place, and time. Coordination normal.  Skin: Skin is warm and dry. No rash noted. No erythema.  Psychiatric: He has a normal mood and affect. His behavior is normal. Judgment and thought content normal.           Assessment and Plan

## 2011-10-17 NOTE — Patient Instructions (Addendum)
You are doing well. Please stop effient Start plavix one a day Increase the isosorbide to 30 mg twice a day Take lasix 20 mg one tablet take as needed for shortness of breath (possibly on mon/wed/friday)  Please call us if you have new issues that need to be addressed before your next appt.  Your physician wants you to follow-up in: 3 months.  You will receive a reminder letter in the mail two months in advance. If you don't receive a letter, please call our office to schedule the follow-up appointment.

## 2012-01-13 ENCOUNTER — Other Ambulatory Visit: Payer: Self-pay | Admitting: Cardiovascular Disease

## 2012-01-17 ENCOUNTER — Ambulatory Visit (INDEPENDENT_AMBULATORY_CARE_PROVIDER_SITE_OTHER): Payer: Medicaid Other | Admitting: Cardiovascular Disease

## 2012-01-17 ENCOUNTER — Encounter: Payer: Self-pay | Admitting: Cardiovascular Disease

## 2012-01-17 VITALS — BP 130/80 | HR 102 | Ht 72.0 in | Wt 243.5 lb

## 2012-01-17 DIAGNOSIS — I10 Essential (primary) hypertension: Secondary | ICD-10-CM

## 2012-01-17 DIAGNOSIS — E785 Hyperlipidemia, unspecified: Secondary | ICD-10-CM

## 2012-01-17 DIAGNOSIS — R0602 Shortness of breath: Secondary | ICD-10-CM

## 2012-01-17 DIAGNOSIS — E119 Type 2 diabetes mellitus without complications: Secondary | ICD-10-CM

## 2012-01-17 DIAGNOSIS — I2510 Atherosclerotic heart disease of native coronary artery without angina pectoris: Secondary | ICD-10-CM

## 2012-01-17 NOTE — Progress Notes (Signed)
Patient ID: Danny Scrape Sr., male    DOB: October 15, 1950, 61 y.o.   MRN: [id number]  HPI Comments: 61 year old gentleman with a history of coronary artery disease, cardiac catheterization showing severe two-vessel disease, severe stenosis of the proximal to mid LAD, severe ostial O1 and O2 disease, moderate to severe mid left circumflex disease that appeared aneurysmal, moderate OM 2 disease ejection fraction 40% who had a PCI of the LAD on January 11, 2010, who used to smoke 4 packs a day who stopped earlier in 2011, history of diabetes, hyperlipidemia who presents for routine followup.  No significant chest pain since his last clinic visit. He reports that previous medication changes we made made him feel better. He believes his weight has been slowly improving. He continues to be what he wants. He has been tolerating his medications without any problems.  He reports having an echocardiogram done with Dr. Carron Brazen office in the past.  Recent lab work shows total cholesterol 111, LDL 37, HDL 53   EKG shows sinus tachycardia with rate 100 beats per minute with rare APCs, nonspecific T wave abnormality   Outpatient Encounter Prescriptions as of 01/17/2012  Medication Sig Dispense Refill  . aspirin 81 MG tablet Take 81 mg by mouth daily.        . carvedilol (COREG) 25 MG tablet Take 25 mg by mouth 2 (two) times daily with a meal.        . clonazePAM (KLONOPIN) 0.5 MG tablet Take 0.5 mg by mouth 2 (two) times daily as needed.        . clopidogrel (PLAVIX) 75 MG tablet Take 1 tablet (75 mg total) by mouth daily.  30 tablet  6  . colesevelam (WELCHOL) 625 MG tablet Take 1,875 mg by mouth 2 (two) times daily with a meal.       . glyBURIDE-metformin (GLUCOVANCE) 2.5-500 MG per tablet Take 1 tablet by mouth 2 (two) times daily with a meal.        . isosorbide mononitrate (IMDUR) 30 MG 24 hr tablet Take 1 tablet (30 mg total) by mouth 2 (two) times daily.  60 tablet  6  . lisinopril-hydrochlorothiazide  (PRINZIDE,ZESTORETIC) 20-25 MG per tablet Take 1 tablet by mouth daily.        . nitroGLYCERIN (NITROSTAT) 0.4 MG SL tablet Place 0.4 mg under the tongue every 5 (five) minutes as needed.        . ranitidine (ZANTAC) 150 MG tablet Take 150 mg by mouth daily.       . simvastatin (ZOCOR) 40 MG tablet TAKE ONE TABLET BY MOUTH AT BEDTIME  90 tablet  3   Review of Systems  Constitutional: Negative.   HENT: Negative.   Eyes: Negative.   Respiratory: Negative.   Gastrointestinal: Negative.   Musculoskeletal: Positive for back pain.  Skin: Negative.   Neurological: Negative.   Hematological: Negative.   Psychiatric/Behavioral: Negative.   All other systems reviewed and are negative.    BP 130/80  Pulse 102  Ht 6' (1.829 m)  Wt 243 lb 8 oz (110.451 kg)  BMI 33.02 kg/m2  Physical Exam  Nursing note and vitals reviewed. Constitutional: He is oriented to person, place, and time. He appears well-developed and well-nourished.       obese  HENT:  Head: Normocephalic.  Nose: Nose normal.  Mouth/Throat: Oropharynx is clear and moist.  Eyes: Conjunctivae are normal. Pupils are equal, round, and reactive to light.  Neck: Normal range of motion. Neck  supple. No JVD present.  Cardiovascular: Normal rate, regular rhythm, S1 normal, S2 normal, normal heart sounds and intact distal pulses.  Exam reveals no gallop and no friction rub.   No murmur heard. Pulmonary/Chest: Effort normal and breath sounds normal. No respiratory distress. He has no wheezes. He has no rales. He exhibits no tenderness.  Abdominal: Soft. Bowel sounds are normal. He exhibits no distension. There is no tenderness.  Musculoskeletal: Normal range of motion. He exhibits no edema and no tenderness.  Lymphadenopathy:    He has no cervical adenopathy.  Neurological: He is alert and oriented to person, place, and time. Coordination normal.  Skin: Skin is warm and dry. No rash noted. No erythema.  Psychiatric: He has a normal mood  and affect. His behavior is normal. Judgment and thought content normal.           Assessment and Plan

## 2012-01-17 NOTE — Assessment & Plan Note (Signed)
Blood pressure is well controlled on today's visit. No changes made to the medications. 

## 2012-01-17 NOTE — Patient Instructions (Addendum)
You are doing well. No medication changes were made.  Please call us if you have new issues that need to be addressed before your next appt.  Your physician wants you to follow-up in: 6 months.  You will receive a reminder letter in the mail two months in advance. If you don't receive a letter, please call our office to schedule the follow-up appointment.   

## 2012-01-17 NOTE — Assessment & Plan Note (Signed)
Currently with no symptoms of angina. No further workup at this time. Continue current medication regimen. 

## 2012-01-17 NOTE — Assessment & Plan Note (Signed)
Cholesterol is at goal on the current lipid regimen. No changes to the medications were made.  

## 2012-01-17 NOTE — Assessment & Plan Note (Signed)
We have encouraged continued exercise, careful diet management in an effort to lose weight. 

## 2012-03-02 ENCOUNTER — Emergency Department: Payer: Self-pay | Admitting: *Deleted

## 2012-05-03 ENCOUNTER — Emergency Department: Payer: Self-pay | Admitting: *Deleted

## 2012-07-17 ENCOUNTER — Emergency Department: Payer: Self-pay | Admitting: Internal Medicine

## 2013-02-09 ENCOUNTER — Other Ambulatory Visit: Payer: Self-pay

## 2013-02-11 ENCOUNTER — Other Ambulatory Visit: Payer: Self-pay

## 2013-02-11 MED ORDER — SIMVASTATIN 40 MG PO TABS: ORAL_TABLET | ORAL | Status: DC

## 2013-02-11 NOTE — Telephone Encounter (Signed)
Refill sent for simvastatin.  

## 2013-02-23 ENCOUNTER — Ambulatory Visit (INDEPENDENT_AMBULATORY_CARE_PROVIDER_SITE_OTHER): Payer: Medicaid Other | Admitting: Cardiovascular Disease

## 2013-02-23 ENCOUNTER — Encounter: Payer: Self-pay | Admitting: Cardiovascular Disease

## 2013-02-23 VITALS — BP 126/82 | HR 85 | Ht 72.0 in | Wt 244.6 lb

## 2013-02-23 DIAGNOSIS — I2510 Atherosclerotic heart disease of native coronary artery without angina pectoris: Secondary | ICD-10-CM

## 2013-02-23 DIAGNOSIS — E119 Type 2 diabetes mellitus without complications: Secondary | ICD-10-CM

## 2013-02-23 DIAGNOSIS — E785 Hyperlipidemia, unspecified: Secondary | ICD-10-CM

## 2013-02-23 DIAGNOSIS — R0602 Shortness of breath: Secondary | ICD-10-CM

## 2013-02-23 DIAGNOSIS — I10 Essential (primary) hypertension: Secondary | ICD-10-CM

## 2013-02-23 DIAGNOSIS — N289 Disorder of kidney and ureter, unspecified: Secondary | ICD-10-CM

## 2013-02-23 NOTE — Assessment & Plan Note (Signed)
Likely has diabetic nephropathy. Stressed to him the importance of good diabetes control.

## 2013-02-23 NOTE — Progress Notes (Signed)
Patient ID: Danny Scrape Sr., male    DOB: 1951-04-24, 62 y.o.   MRN: [id number]  HPI Comments: 62 year old gentleman with a long smoking history , history of coronary artery disease, cardiac catheterization showing severe two-vessel disease, severe stenosis of the proximal to mid LAD, severe ostial O1 and O2 disease, moderate to severe mid left circumflex disease that appeared aneurysmal, moderate OM 2 disease ejection fraction 40% who had a PCI of the LAD on January 11, 2010, who used to smoke 4 packs a day who stopped earlier in 2011, history of diabetes, hyperlipidemia who presents for routine followup.  No significant chest pain since his last clinic visit.  He believes his weight has been slowly improving. Diabetes numbers are still poor control, hemoglobin A1c of 8.0 in June 2014 He was started on onglyza and he reports that this gave him vision side effects, nightmares. He has been off this for the past several days and symptoms have started to resolve.   He continues to be what he wants. He has been tolerating his medications without any problems.  He reports having an echocardiogram done with Dr. Carron Brazen office in the past.  Lab work from June 2014 shows LDL 48, total cholesterol 086, creatinine 1.86, hemoglobin A1c 8.0, normal LFTs   EKG shows sinus tachycardia with rate 85 beats per minute with rare APCs, nonspecific T wave abnormality   Outpatient Encounter Prescriptions as of 02/23/2013  Medication Sig Dispense Refill  . aspirin 81 MG tablet Take 81 mg by mouth daily.        . carvedilol (COREG) 25 MG tablet Take 25 mg by mouth 2 (two) times daily with a meal.        . clopidogrel (PLAVIX) 75 MG tablet Take 75 mg by mouth daily.      [name] glimepiride (AMARYL) 4 MG tablet Take 4 mg by mouth daily before breakfast.      . glyBURIDE-metformin (GLUCOVANCE) 2.5-500 MG per tablet Take 2 tablets by mouth 2 (two) times daily with a meal.       . isosorbide mononitrate (IMDUR) 30 MG 24 hr  tablet Take 1 tablet (30 mg total) by mouth 2 (two) times daily.  60 tablet  6  . lisinopril-hydrochlorothiazide (PRINZIDE,ZESTORETIC) 20-25 MG per tablet Take 1 tablet by mouth daily.        . nitroGLYCERIN (NITROSTAT) 0.4 MG SL tablet Place 0.4 mg under the tongue every 5 (five) minutes as needed.        . pantoprazole (PROTONIX) 40 MG tablet Take 40 mg by mouth daily.      . sertraline (ZOLOFT) 50 MG tablet Take 50 mg by mouth daily.      . simvastatin (ZOCOR) 40 MG tablet TAKE ONE TABLET BY MOUTH AT BEDTIME  90 tablet  0  . [DISCONTINUED] clonazePAM (KLONOPIN) 0.5 MG tablet Take 0.5 mg by mouth 2 (two) times daily as needed.        . [DISCONTINUED] colesevelam (WELCHOL) 625 MG tablet Take 1,875 mg by mouth 2 (two) times daily with a meal.       . [DISCONTINUED] ranitidine (ZANTAC) 150 MG tablet Take 150 mg by mouth daily.        No facility-administered encounter medications on file as of 02/23/2013.   A Review of Systems  Constitutional: Negative.   HENT: Negative.   Eyes: Negative.   Respiratory: Negative.   Gastrointestinal: Negative.   Musculoskeletal: Positive for back pain.  Skin: Negative.  Neurological: Negative.   Psychiatric/Behavioral: Negative.   All other systems reviewed and are negative.    BP 126/82  Pulse 85  Ht 6' (1.829 m)  Wt 244 lb 9.6 oz (110.95 kg)  BMI 33.17 kg/m2  Physical Exam  Nursing note and vitals reviewed. Constitutional: He is oriented to person, place, and time. He appears well-developed and well-nourished.  obese  HENT:  Head: Normocephalic.  Nose: Nose normal.  Mouth/Throat: Oropharynx is clear and moist.  Eyes: Conjunctivae are normal. Pupils are equal, round, and reactive to light.  Neck: Normal range of motion. Neck supple. No JVD present.  Cardiovascular: Normal rate, regular rhythm, S1 normal, S2 normal, normal heart sounds and intact distal pulses.  Exam reveals no gallop and no friction rub.   No murmur heard. Pulmonary/Chest:  Effort normal and breath sounds normal. No respiratory distress. He has no wheezes. He has no rales. He exhibits no tenderness.  Abdominal: Soft. Bowel sounds are normal. He exhibits no distension. There is no tenderness.  Musculoskeletal: Normal range of motion. He exhibits no edema and no tenderness.  Lymphadenopathy:    He has no cervical adenopathy.  Neurological: He is alert and oriented to person, place, and time. Coordination normal.  Skin: Skin is warm and dry. No rash noted. No erythema.  Psychiatric: He has a normal mood and affect. His behavior is normal. Judgment and thought content normal.      Assessment and Plan

## 2013-02-23 NOTE — Assessment & Plan Note (Signed)
Currently with no symptoms of angina. No further workup at this time. Continue current medication regimen. 

## 2013-02-23 NOTE — Assessment & Plan Note (Signed)
Cholesterol is at goal on the current lipid regimen. No changes to the medications were made.  

## 2013-02-23 NOTE — Assessment & Plan Note (Signed)
Blood pressure is well controlled on today's visit. No changes made to the medications. 

## 2013-02-23 NOTE — Assessment & Plan Note (Signed)
We have encouraged continued exercise, careful diet management in an effort to lose weight. Onglyza possibly causing vision symptoms. He will discuss with primary care.

## 2013-02-23 NOTE — Patient Instructions (Addendum)
You are doing well. No medication changes were made.  Please call us if you have new issues that need to be addressed before your next appt.  Your physician wants you to follow-up in: 6 months.  You will receive a reminder letter in the mail two months in advance. If you don't receive a letter, please call our office to schedule the follow-up appointment.   

## 2013-02-24 ENCOUNTER — Encounter: Payer: Self-pay | Admitting: *Deleted

## 2014-02-08 ENCOUNTER — Ambulatory Visit (INDEPENDENT_AMBULATORY_CARE_PROVIDER_SITE_OTHER): Payer: Medicaid Other | Admitting: Cardiovascular Disease

## 2014-02-08 ENCOUNTER — Encounter: Payer: Self-pay | Admitting: Cardiovascular Disease

## 2014-02-08 VITALS — BP 138/90 | HR 85 | Ht 72.0 in | Wt 260.0 lb

## 2014-02-08 DIAGNOSIS — E119 Type 2 diabetes mellitus without complications: Secondary | ICD-10-CM

## 2014-02-08 DIAGNOSIS — R0602 Shortness of breath: Secondary | ICD-10-CM

## 2014-02-08 DIAGNOSIS — E785 Hyperlipidemia, unspecified: Secondary | ICD-10-CM

## 2014-02-08 DIAGNOSIS — I2510 Atherosclerotic heart disease of native coronary artery without angina pectoris: Secondary | ICD-10-CM

## 2014-02-08 DIAGNOSIS — I209 Angina pectoris, unspecified: Secondary | ICD-10-CM

## 2014-02-08 DIAGNOSIS — I10 Essential (primary) hypertension: Secondary | ICD-10-CM

## 2014-02-08 NOTE — Assessment & Plan Note (Signed)
Blood pressure is well controlled on today's visit. No changes made to the medications. 

## 2014-02-08 NOTE — Assessment & Plan Note (Signed)
Currently with no symptoms of angina. No further workup at this time. Continue current medication regimen. 

## 2014-02-08 NOTE — Progress Notes (Signed)
Patient ID: Danny Scrapeobert M Chancy Sr., Danny [name]    DOB: 1951/03/22, 63 y.o.   MRN: [id number]  HPI Comments: 63 year old gentleman with a long smoking history , history of coronary artery disease, cardiac catheterization showing severe two-vessel disease, severe stenosis of the proximal to mid LAD, severe ostial O1 and O2 disease, moderate to severe mid left circumflex disease that appeared aneurysmal, moderate OM 2 disease ejection fraction 40% who had a PCI of the LAD on January 11, 2010, who used to smoke 4 packs a day who stopped earlier in 2011, history of diabetes, hyperlipidemia who presents for routine followup.  No significant chest pain since his last clinic visit.  He took nitroglycerin x1 one month ago. Symptoms presented after eating Lab work early in 2015 showing hemoglobin A1c 12.5. Reports he was started on insulin now taking 50 units twice a day. No followup hemoglobin A1c available Most recent cholesterol in the 130 range She denies having any sleep apnea issues. Weight is up from 244 pounds up to 260 pounds in the past year. He eats what he wants, lots of bread and biscuits. He has been tolerating his medications without any problems.  He reports having an echocardiogram done with Dr. Carron BrazenNiemeyer's office in the past.    EKG shows normal sinus rhythm with rate 85 beats per minute beats per minute, nonspecific T wave abnormality   Outpatient Encounter Prescriptions as of 02/08/2014  Medication Sig  . aspirin 81 MG tablet Take 81 mg by mouth daily.    [name]. buPROPion (WELLBUTRIN XL) 150 MG 24 hr tablet Take 150 mg by mouth daily.  . carvedilol (COREG) 25 MG tablet Take 25 mg by mouth 2 (two) times daily with a meal.    . cetirizine (ZYRTEC) 10 MG tablet Take 10 mg by mouth daily.  . isosorbide mononitrate (IMDUR) 30 MG 24 hr tablet Take 1 tablet (30 mg total) by mouth 2 (two) times daily.  [name]. linagliptin (TRADJENTA) 5 MG TABS tablet Take 5 mg by mouth daily.  [name]. lisinopril-hydrochlorothiazide  (PRINZIDE,ZESTORETIC) 20-25 MG per tablet Take 1 tablet by mouth daily.    . nitroGLYCERIN (NITROSTAT) 0.4 MG SL tablet Place 0.4 mg under the tongue every 5 (five) minutes as needed.    . pantoprazole (PROTONIX) 40 MG tablet Take 40 mg by mouth daily.  . sertraline (ZOLOFT) 50 MG tablet Take 50 mg by mouth daily.  . simvastatin (ZOCOR) 40 MG tablet TAKE ONE TABLET BY MOUTH AT BEDTIME    Review of Systems  Constitutional: Negative.   HENT: Negative.   Eyes: Negative.   Respiratory: Negative.   Cardiovascular: Negative.   Gastrointestinal: Negative.   Endocrine: Negative.   Musculoskeletal: Positive for back pain.  Skin: Negative.   Allergic/Immunologic: Negative.   Neurological: Negative.   Hematological: Negative.   Psychiatric/Behavioral: Negative.   All other systems reviewed and are negative.   BP 138/90  Pulse 85  Ht 6' (1.829 m)  Wt 260 lb (117.935 kg)  BMI 35.25 kg/m2  Physical Exam  Nursing note and vitals reviewed. Constitutional: He is oriented to person, place, and time. He appears well-developed and well-nourished.  obese  HENT:  Head: Normocephalic.  Nose: Nose normal.  Mouth/Throat: Oropharynx is clear and moist.  Eyes: Conjunctivae are normal. Pupils are equal, round, and reactive to light.  Neck: Normal range of motion. Neck supple. No JVD present.  Cardiovascular: Normal rate, regular rhythm, S1 normal, S2 normal, normal heart sounds and intact distal pulses.  Exam  reveals no gallop and no friction rub.   No murmur heard. Pulmonary/Chest: Effort normal and breath sounds normal. No respiratory distress. He has no wheezes. He has no rales. He exhibits no tenderness.  Abdominal: Soft. Bowel sounds are normal. He exhibits no distension. There is no tenderness.  Musculoskeletal: Normal range of motion. He exhibits no edema and no tenderness.  Lymphadenopathy:    He has no cervical adenopathy.  Neurological: He is alert and oriented to person, place, and  time. Coordination normal.  Skin: Skin is warm and dry. No rash noted. No erythema.  Psychiatric: He has a normal mood and affect. His behavior is normal. Judgment and thought content normal.      Assessment and Plan

## 2014-02-08 NOTE — Assessment & Plan Note (Signed)
Cholesterol is at goal on the current lipid regimen. No changes to the medications were made.  

## 2014-02-08 NOTE — Patient Instructions (Signed)
You are doing well. No medication changes were made.  Please call us if you have new issues that need to be addressed before your next appt.  Your physician wants you to follow-up in: 6 months.  You will receive a reminder letter in the mail two months in advance. If you don't receive a letter, please call our office to schedule the follow-up appointment.   

## 2014-02-08 NOTE — Assessment & Plan Note (Signed)
We have encouraged continued exercise, careful diet management in an effort to lose weight. Now on insulin

## 2014-02-08 NOTE — Assessment & Plan Note (Signed)
Rare use of nitroglycerin. Chronic shortness of breath likely secondary to deconditioning and obesity. Suggested he call he office for worsening chest pain

## 2014-02-15 ENCOUNTER — Encounter: Payer: Self-pay | Admitting: Cardiovascular Disease

## 2014-07-30 ENCOUNTER — Emergency Department: Payer: Self-pay | Admitting: Emergency Medicine

## 2014-09-08 ENCOUNTER — Telehealth: Payer: Self-pay | Admitting: Cardiovascular Disease

## 2014-09-08 NOTE — Telephone Encounter (Signed)
Spoke w/ pt at front desk.   He reports burning sensation in his chest after drinking cold water last night and coffee this am that radiates to his left arm. Reports pain woke him from sleep last night, sx resolved after 2 nitro. Reports that he occasionally takes 1 nitro and sx will resolve.  Denies n/v, SOB or sweating. Advised pt to call 911 if sx become emergent.  Pt at front desk to sched f/u w/ Dr. Mariah MillingGollan.

## 2014-09-08 NOTE — Telephone Encounter (Signed)
Pt c/o of Chest Pain: STAT if CP now or developed within 24 hours  1. Are you having CP right now? Yes burning now   2. Are you experiencing any other symptoms (ex. SOB, nausea, vomiting, sweating)? Sob   3. How long have you been experiencing CP? Since last week   4. Is your CP continuous or coming and going? Continuous until nitro taken   5. Have you taken Nitroglycerin? Yes  ? Angelica ChessmanMandy spoke with patient directly

## 2014-09-22 ENCOUNTER — Encounter: Payer: Self-pay | Admitting: Cardiovascular Disease

## 2014-09-22 ENCOUNTER — Ambulatory Visit (INDEPENDENT_AMBULATORY_CARE_PROVIDER_SITE_OTHER): Payer: Medicaid Other | Admitting: Cardiovascular Disease

## 2014-09-22 VITALS — BP 120/82 | HR 94 | Ht 72.0 in | Wt 256.0 lb

## 2014-09-22 DIAGNOSIS — R0602 Shortness of breath: Secondary | ICD-10-CM

## 2014-09-22 DIAGNOSIS — E785 Hyperlipidemia, unspecified: Secondary | ICD-10-CM

## 2014-09-22 DIAGNOSIS — N289 Disorder of kidney and ureter, unspecified: Secondary | ICD-10-CM

## 2014-09-22 DIAGNOSIS — E1165 Type 2 diabetes mellitus with hyperglycemia: Secondary | ICD-10-CM

## 2014-09-22 DIAGNOSIS — IMO0002 Reserved for concepts with insufficient information to code with codable children: Secondary | ICD-10-CM

## 2014-09-22 DIAGNOSIS — I200 Unstable angina: Secondary | ICD-10-CM

## 2014-09-22 DIAGNOSIS — R079 Chest pain, unspecified: Secondary | ICD-10-CM

## 2014-09-22 DIAGNOSIS — I10 Essential (primary) hypertension: Secondary | ICD-10-CM

## 2014-09-22 DIAGNOSIS — E118 Type 2 diabetes mellitus with unspecified complications: Secondary | ICD-10-CM

## 2014-09-22 MED ORDER — CLOPIDOGREL BISULFATE 75 MG PO TABS: 75.0000 mg | ORAL_TABLET | Freq: Every day | ORAL | Status: DC

## 2014-09-22 NOTE — Assessment & Plan Note (Signed)
Repeat lab work next week without Lasix for 4 days

## 2014-09-22 NOTE — Assessment & Plan Note (Signed)
Cholesterol is at goal on the current lipid regimen. No changes to the medications were made.  

## 2014-09-22 NOTE — Progress Notes (Signed)
Patient ID: Danny Scrape Sr., male    DOB: [birth year], 64 y.o.   MRN: [id number]  HPI Comments: 64 year old gentleman with a long smoking history , history of coronary artery disease, cardiac catheterization showing severe two-vessel disease, severe stenosis of the proximal to mid LAD, severe ostial O1 and O2 disease, moderate to severe mid left circumflex disease that appeared aneurysmal, moderate OM 2 disease ejection fraction 40% who had a PCI of the LAD on January 11, 2010, who used to smoke 4 packs a day who stopped earlier in 2011, history of diabetes, hyperlipidemia who presents for routine followup of his coronary artery disease.  In follow-up today, he's been taking more nitroglycerin for chest pain He took nitroglycerin 3 days ago with slow improvement of his chest pain symptoms. Last week took nitroglycerin for chest pain that woke him up at 2 in the morning. Had to repeat nitroglycerin 30 minutes later. Reports he has taken at least for nitroglycerin in the past several weeks. Prior to that did not need nitroglycerin at all for any chest pain symptoms. Also reports having some reproducible chest pain with exertion, sometimes requiring nitroglycerin. Typically he stops and pain will eventually resolve He reports symptoms are similar to his previous anginal symptoms prior to stent placement  He reports his diet is poor, eats what he wants  EKG on today's visit shows normal sinus rhythm with rate 96 bpm, nonspecific ST abnormality  Recent lab work from January 2016 shows creatinine 1.67, BUN 33, glucose 520, hemoglobin A1c 14, total cholesterol 168, LDL 71   No Known Allergies  Outpatient Encounter Prescriptions as of 09/22/2014  Medication Sig  . aspirin 81 MG tablet Take 81 mg by mouth daily.    [name] buPROPion (WELLBUTRIN XL) 150 MG 24 hr tablet Take 150 mg by mouth daily.  . carvedilol (COREG) 25 MG tablet Take 25 mg by mouth 2 (two) times daily with a meal.    . cetirizine (ZYRTEC)  10 MG tablet Take 10 mg by mouth daily.  . furosemide (LASIX) 20 MG tablet Take 20 mg by mouth daily.  . insulin glargine (LANTUS) 100 UNIT/ML injection Inject 50 Units into the skin 2 (two) times daily.  . isosorbide mononitrate (IMDUR) 30 MG 24 hr tablet Take 1 tablet (30 mg total) by mouth 2 (two) times daily.  [name] linagliptin (TRADJENTA) 5 MG TABS tablet Take 5 mg by mouth daily.  [name] lisinopril-hydrochlorothiazide (PRINZIDE,ZESTORETIC) 20-25 MG per tablet Take 1 tablet by mouth daily.    . nitroGLYCERIN (NITROSTAT) 0.4 MG SL tablet Place 0.4 mg under the tongue every 5 (five) minutes as needed.    . pantoprazole (PROTONIX) 40 MG tablet Take 40 mg by mouth daily.  . sertraline (ZOLOFT) 50 MG tablet Take 50 mg by mouth daily.  . simvastatin (ZOCOR) 40 MG tablet TAKE ONE TABLET BY MOUTH AT BEDTIME  . clopidogrel (PLAVIX) 75 MG tablet Take 1 tablet (75 mg total) by mouth daily.    Past Medical History  Diagnosis Date  . DM2 (diabetes mellitus, type 2)   . HLD (hyperlipidemia)   . HTN (hypertension)   . Non-Q wave myocardial infarction     mild  . CAD (coronary artery disease)     Past Surgical History  Procedure Laterality Date  . Cad: stent to the lad    . Cardiac catheterization    . Coronary angioplasty      Social History  reports that he quit smoking about 5 years  ago. His smoking use included Cigarettes. He smoked 3.00 packs per day for 0 years. He does not have any smokeless tobacco history on file. He reports that he drinks alcohol. He reports that he does not use illicit drugs.  Family History family history includes Heart attack in his father.  Review of Systems  Constitutional: Negative.   Respiratory: Negative.   Cardiovascular: Negative.   Gastrointestinal: Negative.   Musculoskeletal: Positive for back pain.  Skin: Negative.   Neurological: Negative.   Hematological: Negative.   Psychiatric/Behavioral: Negative.   All other systems reviewed and are  negative.   BP 120/82 mmHg  Pulse 94  Ht 6' (1.829 m)  Wt 256 lb (116.121 kg)  BMI 34.71 kg/m2  Physical Exam  Constitutional: He is oriented to person, place, and time. He appears well-developed and well-nourished.  obese  HENT:  Head: Normocephalic.  Nose: Nose normal.  Mouth/Throat: Oropharynx is clear and moist.  Eyes: Conjunctivae are normal. Pupils are equal, round, and reactive to light.  Neck: Normal range of motion. Neck supple. No JVD present.  Cardiovascular: Normal rate, regular rhythm, S1 normal, S2 normal, normal heart sounds and intact distal pulses.  Exam reveals no gallop and no friction rub.   No murmur heard. Pulmonary/Chest: Effort normal and breath sounds normal. No respiratory distress. He has no wheezes. He has no rales. He exhibits no tenderness.  Abdominal: Soft. Bowel sounds are normal. He exhibits no distension. There is no tenderness.  Musculoskeletal: Normal range of motion. He exhibits no edema or tenderness.  Lymphadenopathy:    He has no cervical adenopathy.  Neurological: He is alert and oriented to person, place, and time. Coordination normal.  Skin: Skin is warm and dry. No rash noted. No erythema.  Psychiatric: He has a normal mood and affect. His behavior is normal. Judgment and thought content normal.      Assessment and Plan   Nursing note and vitals reviewed.

## 2014-09-22 NOTE — Patient Instructions (Addendum)
You are doing well.  We will schedule a cardiac cath for unstable angina We will check blood work  You are dehydrated DO NOT TAKE LASIX FOR 4 DAYS DRINK PLENTY OF FLUIDS HAVE YOUR LABS DRAWN ON MON OR TUES & RESUME LASIX AFTER LABS  Please start plavix with aspirin one a day  Please call us if you have new issues that need to be addressed before your next appt.  Your physician wants you to follow-up in: 1 month.   [facility] Cardiac Cath Instructions   You are scheduled for a Cardiac Cath on:__Friday, Feb 26__________  Please arrive at __8:30 am__ on the day of your procedure  You will need to pre-register prior to the day of your procedure.  [facility].  Registration is the first desk on your right.  Please take the procedure order we have given you in order to be registered appropriately  Do not eat/drink anything after midnight  Someone will need to drive you home  It is recommended someone be with you for the first 24 hours after your procedure  Wear clothes that are easy to get on/off and wear slip on shoes if possible   Medications bring a current list of all medications with you   _x_ Do not take these medications before your procedure:_______LISINOPRIL-HCTZ & LASIX, TAKE 1/2 YOUR USUAL INSULIN DOSE___________________   Day of your procedure: Arrive at the Medical Mall entrance.  Free valet service is available.  After entering the Medical Mall please check-in at the registration desk (1st desk on your right) to receive your armband. After receiving your armband someone will escort you to the cardiac cath/special procedures waiting area.  The usual length of stay after your procedure is about 2 to 3 hours.  This can vary.  If you have any questions, please call our office at [phone], or you may call the cardiac cath lab at [facility] directly at [phone]

## 2014-09-22 NOTE — Assessment & Plan Note (Signed)
Blood pressure is well controlled on today's visit. No changes made to the medications. 

## 2014-09-22 NOTE — Assessment & Plan Note (Addendum)
Recent symptoms of unstable angina, requiring more nitroglycerin. Will  Schedule for cardiac catheterization given his unstable symptoms. We will start Plavix in addition to his aspirin. Suggested he hold his diuretic for several days with repeat lab work. Prior lab work in general 2016 appears to be mild dehydration

## 2014-09-22 NOTE — Assessment & Plan Note (Signed)
Stressed and the importance of better diabetes control. Needs dietary education

## 2014-09-28 ENCOUNTER — Other Ambulatory Visit: Payer: Self-pay | Admitting: Cardiovascular Disease

## 2014-09-28 ENCOUNTER — Other Ambulatory Visit: Payer: Self-pay

## 2014-09-28 DIAGNOSIS — R079 Chest pain, unspecified: Secondary | ICD-10-CM

## 2014-09-28 DIAGNOSIS — I200 Unstable angina: Secondary | ICD-10-CM

## 2014-09-28 DIAGNOSIS — R0602 Shortness of breath: Secondary | ICD-10-CM

## 2014-10-01 ENCOUNTER — Ambulatory Visit: Payer: Self-pay | Admitting: Cardiovascular Disease

## 2014-10-01 ENCOUNTER — Encounter: Payer: Self-pay | Admitting: Cardiovascular Disease

## 2014-10-01 DIAGNOSIS — I25118 Atherosclerotic heart disease of native coronary artery with other forms of angina pectoris: Secondary | ICD-10-CM

## 2014-10-01 HISTORY — PX: CORONARY ANGIOPLASTY WITH STENT PLACEMENT: SHX49

## 2014-10-01 HISTORY — PX: CARDIAC CATHETERIZATION: SHX172

## 2014-10-04 ENCOUNTER — Other Ambulatory Visit: Payer: Self-pay

## 2014-10-04 ENCOUNTER — Telehealth: Payer: Self-pay

## 2014-10-04 DIAGNOSIS — R079 Chest pain, unspecified: Secondary | ICD-10-CM

## 2014-10-04 DIAGNOSIS — R0602 Shortness of breath: Secondary | ICD-10-CM

## 2014-10-04 DIAGNOSIS — I200 Unstable angina: Secondary | ICD-10-CM

## 2014-10-04 MED ORDER — CANAGLIFLOZIN 100 MG PO TABS: 100.0000 mg | ORAL_TABLET | Freq: Every day | ORAL | Status: DC

## 2014-10-04 MED ORDER — POTASSIUM CHLORIDE ER 10 MEQ PO TBCR: 10.0000 meq | EXTENDED_RELEASE_TABLET | Freq: Every day | ORAL | Status: DC

## 2014-10-04 NOTE — Telephone Encounter (Signed)
Daughter in law has some questions regarding meds that were given to pt at discharge/ please call.

## 2014-10-04 NOTE — Telephone Encounter (Signed)
Spoke w/ Lurena Joinerebecca.   Updated pt's med record to reflect KCL 10 meq QD and Invokana 100 mg QD.  Discussed pt's meds w/ her. She is appreciative and will call back w/ any questions or concerns.

## 2014-10-13 ENCOUNTER — Ambulatory Visit (INDEPENDENT_AMBULATORY_CARE_PROVIDER_SITE_OTHER): Payer: Medicaid Other | Admitting: Cardiovascular Disease

## 2014-10-13 ENCOUNTER — Encounter: Payer: Self-pay | Admitting: Cardiovascular Disease

## 2014-10-13 VITALS — BP 91/64 | HR 94 | Ht 72.0 in | Wt 251.0 lb

## 2014-10-13 DIAGNOSIS — E1165 Type 2 diabetes mellitus with hyperglycemia: Secondary | ICD-10-CM

## 2014-10-13 DIAGNOSIS — R0602 Shortness of breath: Secondary | ICD-10-CM

## 2014-10-13 DIAGNOSIS — N289 Disorder of kidney and ureter, unspecified: Secondary | ICD-10-CM

## 2014-10-13 DIAGNOSIS — I2510 Atherosclerotic heart disease of native coronary artery without angina pectoris: Secondary | ICD-10-CM

## 2014-10-13 DIAGNOSIS — I10 Essential (primary) hypertension: Secondary | ICD-10-CM

## 2014-10-13 DIAGNOSIS — E118 Type 2 diabetes mellitus with unspecified complications: Secondary | ICD-10-CM

## 2014-10-13 DIAGNOSIS — E785 Hyperlipidemia, unspecified: Secondary | ICD-10-CM

## 2014-10-13 DIAGNOSIS — IMO0002 Reserved for concepts with insufficient information to code with codable children: Secondary | ICD-10-CM

## 2014-10-13 DIAGNOSIS — R5383 Other fatigue: Secondary | ICD-10-CM

## 2014-10-13 NOTE — Assessment & Plan Note (Signed)
Hemoglobin A1c very elevated. Basically he eats what he wants. May benefit from diabetes education, endocrinology

## 2014-10-13 NOTE — Progress Notes (Signed)
Patient ID: Danny Scrapeobert M Koenig Sr., male    DOB: 1951/02/25, 64 y.o.   MRN: [id number]  HPI Comments: 64 year old gentleman with a long smoking history , poorly controlled diabetes, history of coronary artery disease, cardiac catheterization showing severe two-vessel disease, severe stenosis of the proximal to mid LAD, severe ostial O1 and O2 disease, moderate to severe mid left circumflex disease that appeared aneurysmal, moderate OM 2 disease ejection fraction 40% who had a PCI of the LAD on January 11, 2010, who used to smoke 4 packs a day who stopped earlier in 2011, history of diabetes, hyperlipidemia who presents for routine followup after recent cardiac catheterization  He was scheduled for cardiac catheterization 10/01/2014. This showed diffuse 90% stenosis at the mid LAD prior to the previous stent before the second diagonal, some in-stent restenosis of a previously placed vision stent no . Also with moderate mid left circumflex disease He had stent placed to the mid LAD, ejection fraction greater than 55%  In follow-up today he reports that he feels tired. He does not have a blood pressure cuff at home Previously had poor diet, severely elevated hemoglobin A1c Reports he is trying the better now Denies any further chest pain or shortness of breath with exertion  EKG on today's visit shows normal sinus rhythm with rate 94 bpm, no significant ST or T-wave changes   lab work from January 2016 shows creatinine 1.67, BUN 33, glucose 520, hemoglobin A1c 14, total cholesterol 168, LDL 71   No Known Allergies  Outpatient Encounter Prescriptions as of 10/13/2014  Medication Sig  . aspirin 81 MG tablet Take 81 mg by mouth daily.    [name]. buPROPion (WELLBUTRIN XL) 150 MG 24 hr tablet Take 150 mg by mouth daily.  . canagliflozin (INVOKANA) 100 MG TABS tablet Take 1 tablet (100 mg total) by mouth daily.  . carvedilol (COREG) 25 MG tablet Take 25 mg by mouth 2 (two) times daily with a meal.    . cetirizine  (ZYRTEC) 10 MG tablet Take 10 mg by mouth daily.  . clopidogrel (PLAVIX) 75 MG tablet Take 1 tablet (75 mg total) by mouth daily.  . insulin glargine (LANTUS) 100 UNIT/ML injection Inject 50 Units into the skin 2 (two) times daily.  . isosorbide mononitrate (IMDUR) 30 MG 24 hr tablet Take 1 tablet (30 mg total) by mouth 2 (two) times daily.  [name]. linagliptin (TRADJENTA) 5 MG TABS tablet Take 5 mg by mouth daily.  [name]. lisinopril-hydrochlorothiazide (PRINZIDE,ZESTORETIC) 20-25 MG per tablet Take 1 tablet by mouth daily.    . nitroGLYCERIN (NITROSTAT) 0.4 MG SL tablet Place 0.4 mg under the tongue every 5 (five) minutes as needed.    . pantoprazole (PROTONIX) 40 MG tablet Take 40 mg by mouth daily.  . potassium chloride (K-DUR) 10 MEQ tablet Take 1 tablet (10 mEq total) by mouth daily.  . sertraline (ZOLOFT) 50 MG tablet Take 50 mg by mouth daily.  . simvastatin (ZOCOR) 40 MG tablet TAKE ONE TABLET BY MOUTH AT BEDTIME  . furosemide (LASIX) 20 MG tablet Take 20 mg by mouth daily.    Past Medical History  Diagnosis Date  . DM2 (diabetes mellitus, type 2)   . HLD (hyperlipidemia)   . HTN (hypertension)   . Non-Q wave myocardial infarction     mild  . CAD (coronary artery disease)     Past Surgical History  Procedure Laterality Date  . Cad: stent to the lad    . Cardiac catheterization  10/01/2014  . Coronary angioplasty with stent placement  10/01/2014    Social History  reports that he quit smoking about 5 years ago. His smoking use included Cigarettes. He smoked 3.00 packs per day for 0 years. He does not have any smokeless tobacco history on file. He reports that he drinks alcohol. He reports that he does not use illicit drugs.  Family History family history includes Heart attack in his father.  Review of Systems  Constitutional: Negative.   Respiratory: Negative.   Cardiovascular: Negative.   Gastrointestinal: Negative.   Musculoskeletal: Positive for back pain.  Skin: Negative.    Neurological: Negative.   Hematological: Negative.   Psychiatric/Behavioral: Negative.   All other systems reviewed and are negative.   BP 91/64 mmHg  Pulse 94  Ht 6' (1.829 m)  Wt 251 lb (113.853 kg)  BMI 34.03 kg/m2  Physical Exam  Constitutional: He is oriented to person, place, and time. He appears well-developed and well-nourished.  obese  HENT:  Head: Normocephalic.  Nose: Nose normal.  Mouth/Throat: Oropharynx is clear and moist.  Eyes: Conjunctivae are normal. Pupils are equal, round, and reactive to light.  Neck: Normal range of motion. Neck supple. No JVD present.  Cardiovascular: Normal rate, regular rhythm, S1 normal, S2 normal, normal heart sounds and intact distal pulses.  Exam reveals no gallop and no friction rub.   No murmur heard. Pulmonary/Chest: Effort normal and breath sounds normal. No respiratory distress. He has no wheezes. He has no rales. He exhibits no tenderness.  Abdominal: Soft. Bowel sounds are normal. He exhibits no distension. There is no tenderness.  Musculoskeletal: Normal range of motion. He exhibits no edema or tenderness.  Lymphadenopathy:    He has no cervical adenopathy.  Neurological: He is alert and oriented to person, place, and time. Coordination normal.  Skin: Skin is warm and dry. No rash noted. No erythema.  Psychiatric: He has a normal mood and affect. His behavior is normal. Judgment and thought content normal.      Assessment and Plan   Nursing note and vitals reviewed.

## 2014-10-13 NOTE — Assessment & Plan Note (Signed)
Most recent lipid panel not available. Encouraged him to stay on his simvastatin. Goal LDL less than 70

## 2014-10-13 NOTE — Assessment & Plan Note (Signed)
Low blood pressure on today's visit. We have recommended he decrease Lasix down to every other day. Currently with no signs of heart failure.  We'll also decrease lisinopril HCTZ down to one half pill per day

## 2014-10-13 NOTE — Patient Instructions (Signed)
You are doing well.  You have low blood pressure Please cut the lisinopril/HCTZ in 1/2 daily Take lasix every other day  Please call us if you have new issues that need to be addressed before your next appt.  Your physician wants you to follow-up in: 2 months.

## 2014-10-13 NOTE — Assessment & Plan Note (Signed)
Recent creatinine in the hospital 1.4 Stable

## 2014-10-13 NOTE — Assessment & Plan Note (Signed)
Recent drug-eluting stent to his mid LAD at site of prior stent. Currently with no symptoms of angina. Continue aspirin and Plavix

## 2014-10-21 ENCOUNTER — Ambulatory Visit: Payer: Medicaid Other | Admitting: Cardiovascular Disease

## 2014-11-23 ENCOUNTER — Emergency Department
Admit: 2014-11-23 | Disposition: A | Discharge status: Home / Self Care | Payer: Self-pay | Admitting: Emergency Medicine

## 2014-11-23 LAB — COMPREHENSIVE METABOLIC PANEL
Albumin: 4 g/dL
Alkaline Phosphatase: 96 U/L
Anion Gap: 11 (ref 7–16)
BUN: 40 mg/dL — ABNORMAL HIGH
Bilirubin,Total: 0.6 mg/dL
Calcium, Total: 8.6 mg/dL — ABNORMAL LOW
Chloride: 100 mmol/L — ABNORMAL LOW
Co2: 24 mmol/L
Creatinine: 2.16 mg/dL — ABNORMAL HIGH
EGFR (African American): 36 — ABNORMAL LOW
EGFR (Non-African Amer.): 31 — ABNORMAL LOW
Glucose: 370 mg/dL — ABNORMAL HIGH
Potassium: 4 mmol/L
SGOT(AST): 18 U/L
SGPT (ALT): 14 U/L — ABNORMAL LOW
Sodium: 135 mmol/L
Total Protein: 7.3 g/dL

## 2014-11-23 LAB — URINALYSIS, COMPLETE
Bacteria: NONE SEEN
Bilirubin,UR: NEGATIVE
Blood: NEGATIVE
Glucose,UR: >=500 mg/dL (ref 0–75)
Ketone: NEGATIVE
Leukocyte Esterase: NEGATIVE
Nitrite: NEGATIVE
Ph: 5 (ref 4.5–8.0)
Protein: NEGATIVE
Specific Gravity: 1.025 (ref 1.003–1.030)
Squamous Epithelial: NONE SEEN

## 2014-11-23 LAB — CBC
HCT: 46.1 % (ref 40.0–52.0)
HGB: 15.1 g/dL (ref 13.0–18.0)
MCH: 30.3 pg (ref 26.0–34.0)
MCHC: 32.8 g/dL (ref 32.0–36.0)
MCV: 93 fL (ref 80–100)
Platelet: 229 10*3/uL (ref 150–440)
RBC: 4.98 10*6/uL (ref 4.40–5.90)
RDW: 12.7 % (ref 11.5–14.5)
WBC: 9.4 10*3/uL (ref 3.8–10.6)

## 2014-11-23 LAB — TROPONIN I: Troponin-I: <0.03 ng/mL

## 2014-12-13 ENCOUNTER — Ambulatory Visit (INDEPENDENT_AMBULATORY_CARE_PROVIDER_SITE_OTHER): Payer: Medicaid Other | Admitting: Cardiovascular Disease

## 2014-12-13 ENCOUNTER — Encounter: Payer: Self-pay | Admitting: Cardiovascular Disease

## 2014-12-13 VITALS — BP 118/68 | HR 99 | Ht 72.0 in | Wt 259.0 lb

## 2014-12-13 DIAGNOSIS — E785 Hyperlipidemia, unspecified: Secondary | ICD-10-CM

## 2014-12-13 DIAGNOSIS — R55 Syncope and collapse: Secondary | ICD-10-CM

## 2014-12-13 DIAGNOSIS — R079 Chest pain, unspecified: Secondary | ICD-10-CM | POA: Diagnosis not present

## 2014-12-13 DIAGNOSIS — I2510 Atherosclerotic heart disease of native coronary artery without angina pectoris: Secondary | ICD-10-CM | POA: Diagnosis not present

## 2014-12-13 DIAGNOSIS — E118 Type 2 diabetes mellitus with unspecified complications: Secondary | ICD-10-CM | POA: Diagnosis not present

## 2014-12-13 DIAGNOSIS — IMO0002 Reserved for concepts with insufficient information to code with codable children: Secondary | ICD-10-CM

## 2014-12-13 DIAGNOSIS — E1165 Type 2 diabetes mellitus with hyperglycemia: Secondary | ICD-10-CM

## 2014-12-13 DIAGNOSIS — I10 Essential (primary) hypertension: Secondary | ICD-10-CM | POA: Diagnosis not present

## 2014-12-13 DIAGNOSIS — I209 Angina pectoris, unspecified: Secondary | ICD-10-CM

## 2014-12-13 DIAGNOSIS — N289 Disorder of kidney and ureter, unspecified: Secondary | ICD-10-CM

## 2014-12-13 NOTE — Assessment & Plan Note (Signed)
He is taking nitroglycerin sparingly. Recent stent placement. Suspect small vessel disease

## 2014-12-13 NOTE — Progress Notes (Signed)
Patient ID: Danny Scrapeobert M Bruinsma Sr., male    DOB: [birth year], 64 y.o.   MRN: [id number]  HPI Comments: 64 year old gentleman with a long smoking history , history of coronary artery disease, cardiac catheterization showing severe two-vessel disease, severe stenosis of the proximal to mid LAD, severe ostial O1 and O2 disease, moderate to severe mid left circumflex disease that appeared aneurysmal, moderate OM 2 disease ejection fraction 40% who had a PCI of the LAD on January 11, 2010, who used to smoke 4 packs a day who stopped earlier in 2011, history of diabetes, hyperlipidemia who presents for routine followup of his coronary artery disease.  In follow-up today, he reports that he is having difficulty managing his diabetes. Seen by Dr. Lacie ScottsNiemeyer and provided would lots of samples of Invokana. He has not started these samples yet. He is afraid of starting a new medication On his last clinic visit, we requested that he decrease his Lasix down to every other day. He does have some medication confusion. He was seen in the hospital 11/23/2014 with elevated glucose of more than 300, also with creatinine of 2, BUN 40. He was given saline IV and discharged home.  Also reports he is taking Levaquin provided by primary care. This caused severe leg cramping. Presumably this was for bronchitis. He is taken several of the pills but has stopped and did not finish the course. Cough is slowly getting better. He reports having recent episode of syncope several days ago. Was sitting at the time, friend witnessed this. Woke up and felt back to his baseline relatively quickly. Sometimes has chest pain, takes nitroglycerin He reports his diet is poor, eats what he wants  EKG on today's visit shows normal sinus rhythm with rate 99 bpm, nonspecific ST abnormality, PVCs in a bigeminal pattern  lab work from January 2016 shows creatinine 1.67, BUN 33, glucose 520, hemoglobin A1c 14, total cholesterol 168, LDL 71   No Known  Allergies  Outpatient Encounter Prescriptions as of 12/13/2014  Medication Sig  . albuterol (PROVENTIL) (2.5 MG/3ML) 0.083% nebulizer solution Take 2.5 mg by nebulization every 6 (six) hours as needed for wheezing or shortness of breath.  [name]. aspirin 81 MG tablet Take 81 mg by mouth daily.    . carvedilol (COREG) 25 MG tablet Take 25 mg by mouth 2 (two) times daily with a meal.    . cetirizine (ZYRTEC) 10 MG tablet Take 10 mg by mouth daily.  . clopidogrel (PLAVIX) 75 MG tablet Take 1 tablet (75 mg total) by mouth daily.  . empagliflozin (JARDIANCE) 25 MG TABS tablet Take 25 mg by mouth daily.  . furosemide (LASIX) 20 MG tablet Take 20 mg by mouth daily.  . insulin glargine (LANTUS) 100 UNIT/ML injection Inject 80 Units into the skin 2 (two) times daily.   . isosorbide mononitrate (IMDUR) 30 MG 24 hr tablet Take 1 tablet (30 mg total) by mouth 2 (two) times daily.  [name]. linagliptin (TRADJENTA) 5 MG TABS tablet Take 5 mg by mouth daily.  . nitroGLYCERIN (NITROSTAT) 0.4 MG SL tablet Place 0.4 mg under the tongue every 5 (five) minutes as needed.    . pantoprazole (PROTONIX) 40 MG tablet Take 40 mg by mouth daily.  . simvastatin (ZOCOR) 40 MG tablet TAKE ONE TABLET BY MOUTH AT BEDTIME  . [DISCONTINUED] buPROPion (WELLBUTRIN XL) 150 MG 24 hr tablet Take 150 mg by mouth daily.  . [DISCONTINUED] canagliflozin (INVOKANA) 100 MG TABS tablet Take 1 tablet (100 mg total)  by mouth daily. (Patient not taking: Reported on 12/13/2014)  . [DISCONTINUED] lisinopril-hydrochlorothiazide (PRINZIDE,ZESTORETIC) 20-25 MG per tablet Take 1 tablet by mouth daily.    . [DISCONTINUED] potassium chloride (K-DUR) 10 MEQ tablet Take 1 tablet (10 mEq total) by mouth daily. (Patient not taking: Reported on 12/13/2014)  . [DISCONTINUED] sertraline (ZOLOFT) 50 MG tablet Take 50 mg by mouth daily.   No facility-administered encounter medications on file as of 12/13/2014.    Past Medical History  Diagnosis Date  . DM2 (diabetes  mellitus, type 2)   . HLD (hyperlipidemia)   . HTN (hypertension)   . Non-Q wave myocardial infarction     mild  . CAD (coronary artery disease)     Past Surgical History  Procedure Laterality Date  . Cad: stent to the lad    . Cardiac catheterization  10/01/2014  . Coronary angioplasty with stent placement  10/01/2014    Social History  reports that he quit smoking about 5 years ago. His smoking use included Cigarettes. He smoked 3.00 packs per day for 0 years. He does not have any smokeless tobacco history on file. He reports that he drinks alcohol. He reports that he does not use illicit drugs.  Family History family history includes Heart attack in his father.  Review of Systems  Constitutional: Negative.   Respiratory: Negative.   Cardiovascular: Negative.   Gastrointestinal: Negative.   Musculoskeletal: Positive for back pain.  Skin: Negative.   Neurological: Negative.   Hematological: Negative.   Psychiatric/Behavioral: Negative.   All other systems reviewed and are negative.   BP 118/68 mmHg  Pulse 99  Ht 6' (1.829 m)  Wt 259 lb (117.482 kg)  BMI 35.12 kg/m2  Physical Exam  Constitutional: He is oriented to person, place, and time. He appears well-developed and well-nourished.  obese  HENT:  Head: Normocephalic.  Nose: Nose normal.  Mouth/Throat: Oropharynx is clear and moist.  Eyes: Conjunctivae are normal. Pupils are equal, round, and reactive to light.  Neck: Normal range of motion. Neck supple. No JVD present.  Cardiovascular: Normal rate, regular rhythm, S1 normal, S2 normal, normal heart sounds and intact distal pulses.  Exam reveals no gallop and no friction rub.   No murmur heard. Pulmonary/Chest: Effort normal and breath sounds normal. No respiratory distress. He has no wheezes. He has no rales. He exhibits no tenderness.  Abdominal: Soft. Bowel sounds are normal. He exhibits no distension. There is no tenderness.  Musculoskeletal: Normal range of  motion. He exhibits no edema or tenderness.  Lymphadenopathy:    He has no cervical adenopathy.  Neurological: He is alert and oriented to person, place, and time. Coordination normal.  Skin: Skin is warm and dry. No rash noted. No erythema.  Psychiatric: He has a normal mood and affect. His behavior is normal. Judgment and thought content normal.      Assessment and Plan   Nursing note and vitals reviewed.

## 2014-12-13 NOTE — Assessment & Plan Note (Signed)
Recent drug-eluting stent to his mid LAD at site of prior stent. Continue aspirin and Plavix

## 2014-12-13 NOTE — Assessment & Plan Note (Signed)
Etiology of his syncope is unclear. Blood pressure stable. Unable to exclude arrhythmia. Suggested if he has additional episodes of syncope that a call our office. 30 day monitor will be ordered

## 2014-12-13 NOTE — Assessment & Plan Note (Signed)
He was provided with samples of Invokana by primary care. He has not started these yet. Recent evaluation in the emergency room several weeks ago for elevated glucose level, greater than 300. Suspect dietary noncompliance

## 2014-12-13 NOTE — Assessment & Plan Note (Signed)
Blood pressure is well controlled on today's visit. No changes made to the medications. 

## 2014-12-13 NOTE — Patient Instructions (Signed)
You are doing well. No medication changes were made.  Only take lasix when you have shortness of breath, leg swelling, ABD bloating  Please call if you have more pass out spells  Please call us if you have new issues that need to be addressed before your next appt.  Your physician wants you to follow-up in: 3 months.  You will receive a reminder letter in the mail two months in advance. If you don't receive a letter, please call our office to schedule the follow-up appointment.

## 2014-12-13 NOTE — Assessment & Plan Note (Signed)
Encouraged him to stay on his simvastatin 40 mg daily. Goal LDL less than 70

## 2014-12-13 NOTE — Assessment & Plan Note (Signed)
Prior lab work 3 weeks ago suggesting dehydration. Recommended he take Lasix only as needed for ankle swelling, shortness of breath, abdominal bloating

## 2014-12-20 ENCOUNTER — Encounter: Payer: Self-pay | Admitting: Cardiovascular Disease

## 2014-12-20 ENCOUNTER — Ambulatory Visit (INDEPENDENT_AMBULATORY_CARE_PROVIDER_SITE_OTHER): Payer: Medicaid Other | Admitting: Cardiovascular Disease

## 2014-12-20 VITALS — BP 142/90 | HR 73 | Ht 72.0 in | Wt 252.5 lb

## 2014-12-20 DIAGNOSIS — R079 Chest pain, unspecified: Secondary | ICD-10-CM | POA: Diagnosis not present

## 2014-12-20 DIAGNOSIS — E1165 Type 2 diabetes mellitus with hyperglycemia: Secondary | ICD-10-CM

## 2014-12-20 DIAGNOSIS — I200 Unstable angina: Secondary | ICD-10-CM

## 2014-12-20 DIAGNOSIS — I10 Essential (primary) hypertension: Secondary | ICD-10-CM | POA: Diagnosis not present

## 2014-12-20 DIAGNOSIS — I2510 Atherosclerotic heart disease of native coronary artery without angina pectoris: Secondary | ICD-10-CM

## 2014-12-20 DIAGNOSIS — K219 Gastro-esophageal reflux disease without esophagitis: Secondary | ICD-10-CM | POA: Diagnosis not present

## 2014-12-20 DIAGNOSIS — IMO0002 Reserved for concepts with insufficient information to code with codable children: Secondary | ICD-10-CM

## 2014-12-20 DIAGNOSIS — E118 Type 2 diabetes mellitus with unspecified complications: Secondary | ICD-10-CM

## 2014-12-20 DIAGNOSIS — E785 Hyperlipidemia, unspecified: Secondary | ICD-10-CM

## 2014-12-20 MED ORDER — PANTOPRAZOLE SODIUM 40 MG PO TBEC: 40.0000 mg | DELAYED_RELEASE_TABLET | Freq: Every day | ORAL | Status: DC

## 2014-12-20 NOTE — Assessment & Plan Note (Signed)
Blood pressure borderline elevated but he has not taken his morning blood pressure medication

## 2014-12-20 NOTE — Patient Instructions (Signed)
For stomach and chest pain Please restart the pantoprazole one a day  If pain comes back, OK to take with famotidine (take 2 ) and TUMS as needed Try soda  Ok to take Nitro. Call if pain comes back  Please call us if you have new issues that need to be addressed before your next appt.  Your physician wants you to follow-up in: 6 months.  You will receive a reminder letter in the mail two months in advance. If you don't receive a letter, please call our office to schedule the follow-up appointment.

## 2014-12-20 NOTE — Assessment & Plan Note (Signed)
Encouraged him to stay on his simvastatin. 

## 2014-12-20 NOTE — Assessment & Plan Note (Signed)
Currently with no symptoms of angina. No further workup at this time. Continue current medication regimen. 

## 2014-12-20 NOTE — Assessment & Plan Note (Signed)
We have encouraged continued exercise, careful diet management in an effort to lose weight. 

## 2014-12-20 NOTE — Progress Notes (Signed)
Patient ID: Danny Scrapeobert M Raisanen Sr., male    DOB: [birth year], 64 y.o.   MRN: [id number]  HPI Comments: 64 year old gentleman with a long smoking history , history of coronary artery disease, cardiac catheterization showing severe two-vessel disease, severe stenosis of the proximal to mid LAD, severe ostial O1 and O2 disease, moderate to severe mid left circumflex disease that appeared aneurysmal, moderate OM 2 disease ejection fraction 40% who had a PCI of the LAD on January 11, 2010, who used to smoke 4 packs a day who stopped earlier in 2011, history of diabetes, hyperlipidemia who presents for routine followup of his coronary artery disease.   He presents today for walk-in add-on visit for recent chest pain over the weekend. He reports that on Friday night he had 2 bags of potato chips. He woke up later overnight with significant chest discomfort/upper epigastric pain. He took nitroglycerin which helped his symptoms for 30 minutes but pain returned. He took several nitroglycerin through the night. Symptoms better the next day, Sunday and feels well today. He has had GI issues before with symptoms of GERD. He reports that PPI was stopped by primary care some time ago. He does not know why.  EKG on today's visit shows normal sinus rhythm with rate 73 bpm, no significant ST or T-wave changes  Other past medical history He was seen in the hospital 11/23/2014 with elevated glucose of more than 300, also with creatinine of 2, BUN 40. He was given saline IV and discharged home.  Also reports he is taking Levaquin provided by primary care. This caused severe leg cramping. Presumably this was for bronchitis. He is taken several of the pills but has stopped and did not finish the course. Cough is slowly getting better. He reports having recent episode of syncope several days ago. Was sitting at the time, friend witnessed this. Woke up and felt back to his baseline relatively quickly. Sometimes has chest pain, takes  nitroglycerin He reports his diet is poor, eats what he wants  EKG on today's visit shows normal sinus rhythm with rate 99 bpm, nonspecific ST abnormality, PVCs in a bigeminal pattern  lab work from January 2016 shows creatinine 1.67, BUN 33, glucose 520, hemoglobin A1c 14, total cholesterol 168, LDL 71   No Known Allergies  Outpatient Encounter Prescriptions as of 12/20/2014  Medication Sig  . albuterol (PROVENTIL) (2.5 MG/3ML) 0.083% nebulizer solution Take 2.5 mg by nebulization every 6 (six) hours as needed for wheezing or shortness of breath.  [name]. aspirin 81 MG tablet Take 81 mg by mouth daily.    . carvedilol (COREG) 25 MG tablet Take 25 mg by mouth 2 (two) times daily with a meal.    . cetirizine (ZYRTEC) 10 MG tablet Take 10 mg by mouth daily.  . clopidogrel (PLAVIX) 75 MG tablet Take 1 tablet (75 mg total) by mouth daily.  . empagliflozin (JARDIANCE) 25 MG TABS tablet Take 25 mg by mouth daily.  . furosemide (LASIX) 20 MG tablet Take 20 mg by mouth daily.  . insulin glargine (LANTUS) 100 UNIT/ML injection Inject 80 Units into the skin 2 (two) times daily.   . isosorbide mononitrate (IMDUR) 30 MG 24 hr tablet Take 1 tablet (30 mg total) by mouth 2 (two) times daily.  [name]. linagliptin (TRADJENTA) 5 MG TABS tablet Take 5 mg by mouth daily.  . nitroGLYCERIN (NITROSTAT) 0.4 MG SL tablet Place 0.4 mg under the tongue every 5 (five) minutes as needed.    .[name]  pantoprazole (PROTONIX) 40 MG tablet Take 1 tablet (40 mg total) by mouth daily.  . simvastatin (ZOCOR) 40 MG tablet TAKE ONE TABLET BY MOUTH AT BEDTIME  . [DISCONTINUED] pantoprazole (PROTONIX) 40 MG tablet Take 40 mg by mouth daily.   No facility-administered encounter medications on file as of 12/20/2014.    Past Medical History  Diagnosis Date  . DM2 (diabetes mellitus, type 2)   . HLD (hyperlipidemia)   . HTN (hypertension)   . Non-Q wave myocardial infarction     mild  . CAD (coronary artery disease)     Past Surgical  History  Procedure Laterality Date  . Cad: stent to the lad    . Cardiac catheterization  10/01/2014  . Coronary angioplasty with stent placement  10/01/2014    Social History  reports that he quit smoking about 5 years ago. His smoking use included Cigarettes. He smoked 3.00 packs per day for 0 years. He does not have any smokeless tobacco history on file. He reports that he drinks alcohol. He reports that he does not use illicit drugs.  Family History family history includes Heart attack in his father.  Review of Systems  Constitutional: Negative.   Respiratory: Negative.   Cardiovascular: Positive for chest pain.  Gastrointestinal: Negative.   Musculoskeletal: Negative.   Neurological: Negative.   Hematological: Negative.   Psychiatric/Behavioral: Negative.   All other systems reviewed and are negative.   BP 142/90 mmHg  Pulse 73  Ht 6' (1.829 m)  Wt 252 lb 8 oz (114.533 kg)  BMI 34.24 kg/m2  Physical Exam  Constitutional: He is oriented to person, place, and time. He appears well-developed and well-nourished.  obese  HENT:  Head: Normocephalic.  Nose: Nose normal.  Mouth/Throat: Oropharynx is clear and moist.  Eyes: Conjunctivae are normal. Pupils are equal, round, and reactive to light.  Neck: Normal range of motion. Neck supple. No JVD present.  Cardiovascular: Normal rate, regular rhythm, S1 normal, S2 normal, normal heart sounds and intact distal pulses.  Exam reveals no gallop and no friction rub.   No murmur heard. Pulmonary/Chest: Effort normal and breath sounds normal. No respiratory distress. He has no wheezes. He has no rales. He exhibits no tenderness.  Abdominal: Soft. Bowel sounds are normal. He exhibits no distension. There is no tenderness.  Musculoskeletal: Normal range of motion. He exhibits no edema or tenderness.  Lymphadenopathy:    He has no cervical adenopathy.  Neurological: He is alert and oriented to person, place, and time. Coordination  normal.  Skin: Skin is warm and dry. No rash noted. No erythema.  Psychiatric: He has a normal mood and affect. His behavior is normal. Judgment and thought content normal.      Assessment and Plan   Nursing note and vitals reviewed.

## 2014-12-20 NOTE — Assessment & Plan Note (Signed)
We have suggested he restart his pantoprazole Also take Pepcid or Zantac as needed with Tums for breakthrough symptoms Recommended he watch his diet

## 2014-12-20 NOTE — Assessment & Plan Note (Signed)
Most recent symptoms concerning for GERD. Unable to exclude angina. We have recommended if symptoms recur that he call our office

## 2015-03-15 ENCOUNTER — Ambulatory Visit: Payer: Medicaid Other | Admitting: Cardiovascular Disease

## 2015-05-02 ENCOUNTER — Other Ambulatory Visit: Payer: Self-pay | Admitting: Cardiovascular Disease

## 2015-05-15 ENCOUNTER — Encounter: Payer: Self-pay | Admitting: Emergency Medicine

## 2015-05-15 ENCOUNTER — Emergency Department: Payer: Medicaid Other

## 2015-05-15 ENCOUNTER — Emergency Department
Admission: EM | Admit: 2015-05-15 | Discharge: 2015-05-15 | Disposition: A | Discharge status: Home / Self Care | Payer: Medicaid Other | Attending: Emergency Medicine | Admitting: Emergency Medicine

## 2015-05-15 DIAGNOSIS — J441 Chronic obstructive pulmonary disease with (acute) exacerbation: Secondary | ICD-10-CM | POA: Insufficient documentation

## 2015-05-15 DIAGNOSIS — I10 Essential (primary) hypertension: Secondary | ICD-10-CM | POA: Diagnosis not present

## 2015-05-15 DIAGNOSIS — Z7982 Long term (current) use of aspirin: Secondary | ICD-10-CM | POA: Insufficient documentation

## 2015-05-15 DIAGNOSIS — Z87891 Personal history of nicotine dependence: Secondary | ICD-10-CM | POA: Diagnosis not present

## 2015-05-15 DIAGNOSIS — Z79899 Other long term (current) drug therapy: Secondary | ICD-10-CM | POA: Insufficient documentation

## 2015-05-15 DIAGNOSIS — Z794 Long term (current) use of insulin: Secondary | ICD-10-CM | POA: Insufficient documentation

## 2015-05-15 DIAGNOSIS — E118 Type 2 diabetes mellitus with unspecified complications: Secondary | ICD-10-CM | POA: Insufficient documentation

## 2015-05-15 DIAGNOSIS — M546 Pain in thoracic spine: Secondary | ICD-10-CM

## 2015-05-15 DIAGNOSIS — R0602 Shortness of breath: Secondary | ICD-10-CM | POA: Diagnosis present

## 2015-05-15 MED ORDER — OXYCODONE HCL 5 MG PO TABS: 5.0000 mg | ORAL_TABLET | Freq: Three times a day (TID) | ORAL | Status: DC | PRN

## 2015-05-15 MED ORDER — PREDNISONE 20 MG PO TABS: 40.0000 mg | ORAL_TABLET | Freq: Every day | ORAL | Status: DC

## 2015-05-15 NOTE — ED Provider Notes (Signed)
[facility] Emergency Department Provider Note  Time seen: 4:58 PM  I have reviewed the triage vital signs and the nursing notes.   HISTORY  Chief Complaint Shortness of Breath    HPI Danny KUMPF Sr. is a 64 y.o. male with a past medical history of diabetes, hyperlipidemia, hypertension, myocardial infarction, COPD on 2 L 24/7, presents the emergency department with right back pain. According to the patient he has a very long history (many years per patient) of right posterior chest pain. He believes it is due to his COPD, and coughing. He normally takes Ultram for this pain prescribed by his primary care provider, however he states over the past 2 weeks it has been much more severe and over the past several days the Ultram is not working. Patient does state some mild shortness of breath, but he states no worse than typical. Does state occasional cough, and the pain in the back is worse when he coughs. Denies any sputum production. Denies any fever. Denies any worsening of the pain with exertion. Denies any leg pain.Describes the pain as mild constant dull pain with occasional severe sharp pain when he coughs or moves a certain way.    Past Medical History  Diagnosis Date  . DM2 (diabetes mellitus, type 2) (HCC)   . HLD (hyperlipidemia)   . HTN (hypertension)   . Non-Q wave myocardial infarction (HCC)     mild  . CAD (coronary artery disease)     Patient Active Problem List   Diagnosis Date Noted  . GERD (gastroesophageal reflux disease) 12/20/2014  . Syncope 12/13/2014  . Unstable angina (HCC) 09/22/2014  . Renal insufficiency 02/23/2013  . Angina pectoris (HCC) 10/17/2011  . Diabetes mellitus type 2, uncontrolled, with complications (HCC) 06/05/2010  . Hyperlipidemia 02/10/2010  . HYPERTENSION, BENIGN 02/10/2010  . CAD (coronary artery disease) 02/10/2010    Past Surgical History  Procedure Laterality Date  . Cad: stent to the lad    . Cardiac  catheterization  10/01/2014  . Coronary angioplasty with stent placement  10/01/2014    Current Outpatient Rx  Name  Route  Sig  Dispense  Refill  . albuterol (PROVENTIL) (2.5 MG/3ML) 0.083% nebulizer solution   Nebulization   Take 2.5 mg by nebulization every 6 (six) hours as needed for wheezing or shortness of breath.         [name] aspirin 81 MG tablet   Oral   Take 81 mg by mouth daily.           . carvedilol (COREG) 25 MG tablet   Oral   Take 25 mg by mouth 2 (two) times daily with a meal.           . cetirizine (ZYRTEC) 10 MG tablet   Oral   Take 10 mg by mouth daily.         . clopidogrel (PLAVIX) 75 MG tablet      TAKE ONE TABLET BY MOUTH EVERY DAY   30 tablet   3   . empagliflozin (JARDIANCE) 25 MG TABS tablet   Oral   Take 25 mg by mouth daily.         . furosemide (LASIX) 20 MG tablet   Oral   Take 20 mg by mouth daily.         . insulin glargine (LANTUS) 100 UNIT/ML injection   Subcutaneous   Inject 80 Units into the skin 2 (two) times daily.          [name]  isosorbide mononitrate (IMDUR) 30 MG 24 hr tablet   Oral   Take 1 tablet (30 mg total) by mouth 2 (two) times daily.   60 tablet   6   . linagliptin (TRADJENTA) 5 MG TABS tablet   Oral   Take 5 mg by mouth daily.         . nitroGLYCERIN (NITROSTAT) 0.4 MG SL tablet   Sublingual   Place 0.4 mg under the tongue every 5 (five) minutes as needed.           . pantoprazole (PROTONIX) 40 MG tablet   Oral   Take 1 tablet (40 mg total) by mouth daily.   90 tablet   3   . simvastatin (ZOCOR) 40 MG tablet      TAKE ONE TABLET BY MOUTH AT BEDTIME   90 tablet   0     Allergies Review of patient's allergies indicates no known allergies.  Family History  Problem Relation Age of Onset  . Heart attack Father     complications    Social History Social History  Substance Use Topics  . Smoking status: Former Smoker -- 3.00 packs/day for 0 years    Types: Cigarettes    Quit date:  09/07/2009  . Smokeless tobacco: None     Comment: quit june 2011  . Alcohol Use: Yes    Review of Systems Constitutional: Negative for fever. Cardiovascular: Positive right posterior chest pain. Respiratory: Mild shortness breath, which she states is baseline. Gastrointestinal: Negative for abdominal pain Musculoskeletal: Positive right back pain. 10-point ROS otherwise negative.  ____________________________________________   PHYSICAL EXAM:  VITAL SIGNS: ED Triage Vitals  Enc Vitals Group     BP 05/15/15 1613 170/113 mmHg     Pulse Rate 05/15/15 1613 79     Resp 05/15/15 1613 25     Temp 05/15/15 1613 97.8 F (36.6 C)     Temp Source 05/15/15 1613 Oral     SpO2 05/15/15 1613 93 %     Weight 05/15/15 1613 252 lb (114.306 kg)     Height 05/15/15 1613 6' (1.829 m)     Head Cir --      Peak Flow --      Pain Score 05/15/15 1614 10     Pain Loc --      Pain Edu? --      Excl. in GC? --     Constitutional: Alert and oriented. Well appearing and in no distress. Wearing nasal cannula oxygen. Eyes: Normal exam ENT   Head: Normocephalic and atraumatic.   Mouth/Throat: Mucous membranes are moist. Cardiovascular: Normal rate, regular rhythm.  Respiratory: Normal respiratory effort without tachypnea nor retractions. Diminished breath sounds bilaterally, no wheeze, rales or rhonchi currently. Gastrointestinal: Soft, nontender to palpation, no distention. Musculoskeletal: Moderate lower thoracic right paraspinal tenderness to palpation. No midline tenderness. Pain is reproducible. Neurologic:  Normal speech and language. No gross focal neurologic deficits  Skin:  Skin is warm, dry. No rash noted. Psychiatric: Mood and affect are normal. Speech and behavior are normal.  ____________________________________________    EKG  EKG reviewed and interpreted by myself shows sinus rhythm at 80 bpm, occasional PAC, narrow QRS, normal axis, largely normal intervals with  nonspecific ST changes present. No ST elevations noted.  ____________________________________________    RADIOLOGY  Chest x-ray no acute change  ____________________________________________    INITIAL IMPRESSION / ASSESSMENT AND PLAN / ED COURSE  Pertinent labs & imaging results that were available during  my care of the patient were reviewed by me and considered in my medical decision making (see chart for details).  Patient presents for right-sided back pain which she states is fairly chronic. He states the severity comes and goes. The Ultram he was prescribed by his primary care provider is not working per patient. His x-ray does not show any abnormalities. His exam shows reproducible pain. Patient is refusing blood work at this time, he states this is something he has been dealing with for a long time and he would rather not do blood work. He states he has a follow-up appointment scheduled with his primary care physician already as well as with his cardiologist. I discussed with the patient that we cannot rule out cardiac causes of his pain, patient understands but does not wish for blood workup at this time. EKG does not show any acute/concerning changes. We will discharge the patient home on a short course of pain medication as well as a short burst course of steroids. Patient agreeable to plan and will follow up with his primary care physician's coming week. Discussed strict return precautions including any chest pain, trouble breathing, or worsening discomfort. Patient agreeable.  ____________________________________________   FINAL CLINICAL IMPRESSION(S) / ED DIAGNOSES  Right posterior chest pain   [name], MD 05/15/15 1706

## 2015-05-15 NOTE — Discharge Instructions (Signed)
As we discussed please return to the emergency department immediately. Any worsening pain, chest pain, trouble breathing, nausea or become sweaty. Otherwise please follow up with her primary care physician this week. Please take your pain medication as needed, as prescribed, please also taken her steroids as written.   Back Pain, Adult Back pain is very common. The pain often gets better over time. The cause of back pain is usually not dangerous. Most people can learn to manage their back pain on their own.  HOME CARE  Watch your back pain for any changes. The following actions may help to lessen any pain you are feeling:  Stay active. Start with short walks on flat ground if you can. Try to walk farther each day.  Exercise regularly as told by your doctor. Exercise helps your back heal faster. It also helps avoid future injury by keeping your muscles strong and flexible.  Do not sit, drive, or stand in one place for more than 30 minutes.  Do not stay in bed. Resting more than 1-2 days can slow down your recovery.  Be careful when you bend or lift an object. Use good form when lifting:  Bend at your knees.  Keep the object close to your body.  Do not twist.  Sleep on a firm mattress. Lie on your side, and bend your knees. If you lie on your back, put a pillow under your knees.  Take medicines only as told by your doctor.  Put ice on the injured area.  Put ice in a plastic bag.  Place a towel between your skin and the bag.  Leave the ice on for 20 minutes, 2-3 times a day for the first 2-3 days. After that, you can switch between ice and heat packs.  Avoid feeling anxious or stressed. Find good ways to deal with stress, such as exercise.  Maintain a healthy weight. Extra weight puts stress on your back. GET HELP IF:   You have pain that does not go away with rest or medicine.  You have worsening pain that goes down into your legs or buttocks.  You have pain that does not  get better in one week.  You have pain at night.  You lose weight.  You have a fever or chills. GET HELP RIGHT AWAY IF:   You cannot control when you poop (bowel movement) or pee (urinate).  Your arms or legs feel weak.  Your arms or legs lose feeling (numbness).  You feel sick to your stomach (nauseous) or throw up (vomit).  You have belly (abdominal) pain.  You feel like you may pass out (faint).   This information is not intended to replace advice given to you by your health care provider. Make sure you discuss any questions you have with your health care provider.   Document Released: 01/09/2008 Document Revised: 08/13/2014 Document Reviewed: 11/24/2013 Elsevier Interactive Patient Education [employer]

## 2015-05-15 NOTE — ED Notes (Signed)
Pt reports shortness of breath "for a while", reports lower back pain. Has been taking pain medication. Pt with distended, hard abdomen.

## 2015-06-20 ENCOUNTER — Encounter: Payer: Self-pay | Admitting: Cardiovascular Disease

## 2015-06-20 ENCOUNTER — Ambulatory Visit (INDEPENDENT_AMBULATORY_CARE_PROVIDER_SITE_OTHER): Payer: Medicaid Other | Admitting: Cardiovascular Disease

## 2015-06-20 VITALS — BP 160/100 | HR 84 | Ht 72.0 in | Wt 259.0 lb

## 2015-06-20 DIAGNOSIS — E785 Hyperlipidemia, unspecified: Secondary | ICD-10-CM

## 2015-06-20 DIAGNOSIS — R0602 Shortness of breath: Secondary | ICD-10-CM | POA: Diagnosis not present

## 2015-06-20 DIAGNOSIS — I10 Essential (primary) hypertension: Secondary | ICD-10-CM

## 2015-06-20 DIAGNOSIS — E118 Type 2 diabetes mellitus with unspecified complications: Secondary | ICD-10-CM

## 2015-06-20 DIAGNOSIS — I2510 Atherosclerotic heart disease of native coronary artery without angina pectoris: Secondary | ICD-10-CM

## 2015-06-20 DIAGNOSIS — J432 Centrilobular emphysema: Secondary | ICD-10-CM

## 2015-06-20 DIAGNOSIS — Z794 Long term (current) use of insulin: Secondary | ICD-10-CM

## 2015-06-20 DIAGNOSIS — E1165 Type 2 diabetes mellitus with hyperglycemia: Secondary | ICD-10-CM

## 2015-06-20 DIAGNOSIS — J449 Chronic obstructive pulmonary disease, unspecified: Secondary | ICD-10-CM | POA: Insufficient documentation

## 2015-06-20 DIAGNOSIS — J4489 Other specified chronic obstructive pulmonary disease: Secondary | ICD-10-CM | POA: Insufficient documentation

## 2015-06-20 DIAGNOSIS — N289 Disorder of kidney and ureter, unspecified: Secondary | ICD-10-CM

## 2015-06-20 DIAGNOSIS — I200 Unstable angina: Secondary | ICD-10-CM

## 2015-06-20 DIAGNOSIS — IMO0002 Reserved for concepts with insufficient information to code with codable children: Secondary | ICD-10-CM

## 2015-06-20 DIAGNOSIS — R55 Syncope and collapse: Secondary | ICD-10-CM

## 2015-06-20 MED ORDER — SIMVASTATIN 40 MG PO TABS: ORAL_TABLET | ORAL | Status: DC

## 2015-06-20 MED ORDER — CLOPIDOGREL BISULFATE 75 MG PO TABS: 75.0000 mg | ORAL_TABLET | Freq: Every day | ORAL | Status: DC

## 2015-06-20 MED ORDER — ISOSORBIDE MONONITRATE ER 30 MG PO TB24: 30.0000 mg | ORAL_TABLET | Freq: Two times a day (BID) | ORAL | Status: DC

## 2015-06-20 NOTE — Assessment & Plan Note (Signed)
High risk of progressive coronary disease. Currently with no symptoms of angina Shortness of breath and Wheezing secondary to bronchospasm and COPD

## 2015-06-20 NOTE — Assessment & Plan Note (Signed)
Suspect medication noncompliance Recommended he stay on his simvastatin daily

## 2015-06-20 NOTE — Assessment & Plan Note (Signed)
History of renal failure, likely secondary to long-standing poorly controlled diabetes We have requested lab work from primary care

## 2015-06-20 NOTE — Assessment & Plan Note (Signed)
Currently with no symptoms concerning for angina Medication noncompliant, blood pressure elevated, poorly controlled diabetes. He has stopped several of his medications We have recommended he restart his isosorbide twice a day, statin, make sure he is taking his Plavix and aspirin We did not push restarting beta blocker given his wheezing and mild bronchospasm. Recommended he restart this once his breathing has improved

## 2015-06-20 NOTE — Assessment & Plan Note (Signed)
Poor diet, medication noncompliance Needs referral to endocrine. Diabetes managed by Dr. Lacie ScottsNiemeyer

## 2015-06-20 NOTE — Assessment & Plan Note (Signed)
Recommended that he restart his isosorbide mononitrate 30 mg twice a day We'll hold off on beta blocker until after bronchospasm has improved

## 2015-06-20 NOTE — Patient Instructions (Addendum)
You are doing well.  Use your nebulizer machine as needed for shortness of breath  Please restart isosorbide twice a day for blood pressure Restart simvastatin for cholesterol  Please call us if you have new issues that need to be addressed before your next appt.  Your physician wants you to follow-up in: 6 months.  You will receive a reminder letter in the mail two months in advance. If you don't receive a letter, please call our office to schedule the follow-up appointment.

## 2015-06-20 NOTE — Progress Notes (Signed)
Patient ID: Danny Scrapeobert M Creamer Sr., male    DOB: 1951-07-19, 64 y.o.   MRN: [id number]  HPI Comments: 64 year old gentleman with a long smoking history , history of coronary artery disease, cardiac catheterization showing severe two-vessel disease, severe stenosis of the proximal to mid LAD, severe ostial O1 and O2 disease, moderate to severe mid left circumflex disease that appeared aneurysmal, moderate OM 2 disease ejection fraction 40% who had a PCI of the LAD on January 11, 2010, who used to smoke 4 packs a day who stopped earlier in 2011, history of diabetes, hyperlipidemia who presents for routine followup of his coronary artery disease. Chronic renal insufficiency, poorly controlled diabetes on insulin, most recent lab work not available  In follow-up today, he denies any chest pain He stopped several of his medications on his own though he is unsure which ones Suspect he is not taking carvedilol, isosorbide. Unclear if he is Taking simvastatin Weight is up, eating poorly, no exercise, poor diet in general. Too much biscuits with gravy Increasing shortness of breath over the past month He is not using his nebulizer Uses inhalers  EKG on today's visit shows normal sinus rhythm with rate 86 bpm, nonspecific ST and T wave abnormality anterolateral leads  Other past medical history Prior to his last clinic visit, he had 2 bags of potato chips. He woke up later overnight with significant chest discomfort/upper epigastric pain. He took nitroglycerin which helped his symptoms for 30 minutes but pain returned. He took several nitroglycerin through the night. Symptoms better the next day, Sunday and feels well today. He has had GI issues before with symptoms of GERD. He reports that PPI was stopped by primary care some time ago. Prior episodes of syncope, was sitting at the time, witnessed by a friend. Resolved quickly with no postictal changes. No symptoms since that time several years ago  He was seen  in the hospital 11/23/2014 with elevated glucose of more than 300, also with creatinine of 2, BUN 40. He was given saline IV and discharged home.  lab work from January 2016 shows creatinine 1.67, BUN 33, glucose 520, hemoglobin A1c 14, total cholesterol 168, LDL 71   No Known Allergies  Outpatient Encounter Prescriptions as of 06/20/2015  Medication Sig  . albuterol (PROVENTIL) (2.5 MG/3ML) 0.083% nebulizer solution Take 2.5 mg by nebulization every 6 (six) hours as needed for wheezing or shortness of breath.  [name]. aspirin 81 MG tablet Take 81 mg by mouth daily.    . carvedilol (COREG) 25 MG tablet Take 25 mg by mouth 2 (two) times daily with a meal.    . cetirizine (ZYRTEC) 10 MG tablet Take 10 mg by mouth daily.  . clopidogrel (PLAVIX) 75 MG tablet Take 1 tablet (75 mg total) by mouth daily.  . insulin glargine (LANTUS) 100 UNIT/ML injection Inject 80 Units into the skin 2 (two) times daily.   . isosorbide mononitrate (IMDUR) 30 MG 24 hr tablet Take 1 tablet (30 mg total) by mouth 2 (two) times daily.  [name]. linagliptin (TRADJENTA) 5 MG TABS tablet Take 5 mg by mouth daily.  . nitroGLYCERIN (NITROSTAT) 0.4 MG SL tablet Place 0.4 mg under the tongue every 5 (five) minutes as needed.    [name]. oxyCODONE (ROXICODONE) 5 MG immediate release tablet Take 1 tablet (5 mg total) by mouth every 8 (eight) hours as needed.  . pantoprazole (PROTONIX) 40 MG tablet Take 1 tablet (40 mg total) by mouth daily.  . simvastatin (ZOCOR) 40  MG tablet TAKE ONE TABLET BY MOUTH AT BEDTIME  . [DISCONTINUED] clopidogrel (PLAVIX) 75 MG tablet TAKE ONE TABLET BY MOUTH EVERY DAY  . [DISCONTINUED] isosorbide mononitrate (IMDUR) 30 MG 24 hr tablet Take 1 tablet (30 mg total) by mouth 2 (two) times daily.  . [DISCONTINUED] predniSONE (DELTASONE) 20 MG tablet Take 2 tablets (40 mg total) by mouth daily.  . [DISCONTINUED] simvastatin (ZOCOR) 40 MG tablet TAKE ONE TABLET BY MOUTH AT BEDTIME  . empagliflozin (JARDIANCE) 25 MG TABS  tablet Take 25 mg by mouth daily.  . [DISCONTINUED] furosemide (LASIX) 20 MG tablet Take 20 mg by mouth daily.   No facility-administered encounter medications on file as of 06/20/2015.    Past Medical History  Diagnosis Date  . DM2 (diabetes mellitus, type 2) (HCC)   . HLD (hyperlipidemia)   . HTN (hypertension)   . Non-Q wave myocardial infarction (HCC)     mild  . CAD (coronary artery disease)     Past Surgical History  Procedure Laterality Date  . Cad: stent to the lad    . Cardiac catheterization  10/01/2014  . Coronary angioplasty with stent placement  10/01/2014    Social History  reports that he quit smoking about 5 years ago. His smoking use included Cigarettes. He smoked 3.00 packs per day for 0 years. He does not have any smokeless tobacco history on file. He reports that he drinks alcohol. He reports that he does not use illicit drugs.  Family History family history includes Heart attack in his father.  Review of Systems  Constitutional: Positive for unexpected weight change.  Respiratory: Positive for shortness of breath and wheezing.   Cardiovascular: Negative.   Gastrointestinal: Negative.   Musculoskeletal: Negative.   Neurological: Negative.   Hematological: Negative.   Psychiatric/Behavioral: Negative.   All other systems reviewed and are negative.   BP 160/100 mmHg  Pulse 84  Ht 6' (1.829 m)  Wt 259 lb (117.482 kg)  BMI 35.12 kg/m2  SpO2 96%  Physical Exam  Constitutional: He is oriented to person, place, and time. He appears well-developed and well-nourished.  obese  HENT:  Head: Normocephalic.  Nose: Nose normal.  Mouth/Throat: Oropharynx is clear and moist.  Eyes: Conjunctivae are normal. Pupils are equal, round, and reactive to light.  Neck: Normal range of motion. Neck supple. No JVD present.  Cardiovascular: Normal rate, regular rhythm, S1 normal, S2 normal, normal heart sounds and intact distal pulses.  Exam reveals no gallop and no  friction rub.   No murmur heard. Pulmonary/Chest: Effort normal and breath sounds normal. No respiratory distress. He has no wheezes. He has no rales. He exhibits no tenderness.  Abdominal: Soft. Bowel sounds are normal. He exhibits no distension. There is no tenderness.  Musculoskeletal: Normal range of motion. He exhibits no edema or tenderness.  Lymphadenopathy:    He has no cervical adenopathy.  Neurological: He is alert and oriented to person, place, and time. Coordination normal.  Skin: Skin is warm and dry. No rash noted. No erythema.  Psychiatric: He has a normal mood and affect. His behavior is normal. Judgment and thought content normal.      Assessment and Plan   Nursing note and vitals reviewed.

## 2015-06-20 NOTE — Assessment & Plan Note (Signed)
No recent episodes of near syncope or syncope. Etiology of prior events unclear, not clearly cardiac given the numerous medical issues that he has

## 2015-06-20 NOTE — Assessment & Plan Note (Signed)
Suspect he has mild bronchospasm. Wheezing on exam Recommended he use his nebulizer machine Possibly exacerbated by allergies

## 2015-09-27 ENCOUNTER — Encounter: Payer: Self-pay | Admitting: Emergency Medicine

## 2015-09-27 ENCOUNTER — Emergency Department
Admission: EM | Admit: 2015-09-27 | Discharge: 2015-09-27 | Disposition: A | Discharge status: Home / Self Care | Payer: Medicaid Other | Attending: Student | Admitting: Student

## 2015-09-27 DIAGNOSIS — M545 Low back pain, unspecified: Secondary | ICD-10-CM

## 2015-09-27 DIAGNOSIS — Z794 Long term (current) use of insulin: Secondary | ICD-10-CM | POA: Diagnosis not present

## 2015-09-27 DIAGNOSIS — Z87891 Personal history of nicotine dependence: Secondary | ICD-10-CM | POA: Diagnosis not present

## 2015-09-27 DIAGNOSIS — G8929 Other chronic pain: Secondary | ICD-10-CM | POA: Insufficient documentation

## 2015-09-27 DIAGNOSIS — E119 Type 2 diabetes mellitus without complications: Secondary | ICD-10-CM | POA: Diagnosis not present

## 2015-09-27 DIAGNOSIS — Z79899 Other long term (current) drug therapy: Secondary | ICD-10-CM | POA: Insufficient documentation

## 2015-09-27 DIAGNOSIS — I10 Essential (primary) hypertension: Secondary | ICD-10-CM | POA: Insufficient documentation

## 2015-09-27 DIAGNOSIS — Z7982 Long term (current) use of aspirin: Secondary | ICD-10-CM | POA: Insufficient documentation

## 2015-09-27 DIAGNOSIS — M546 Pain in thoracic spine: Secondary | ICD-10-CM | POA: Insufficient documentation

## 2015-09-27 DIAGNOSIS — Z7902 Long term (current) use of antithrombotics/antiplatelets: Secondary | ICD-10-CM | POA: Insufficient documentation

## 2015-09-27 MED ORDER — TRAMADOL HCL 50 MG PO TABS: 50.0000 mg | ORAL_TABLET | Freq: Two times a day (BID) | ORAL | Status: DC

## 2015-09-27 NOTE — ED Notes (Signed)
States he developed some mid back pain about 2 weeks ago  States pain radiates across back   Was given muscle relaxers by his PMD  But states pain is worse   Denies any injury  No fever  /trauma or cough

## 2015-09-27 NOTE — ED Provider Notes (Signed)
[facility] Emergency Department Provider Note ____________________________________________  Time seen: 1520  I have reviewed the triage vital signs and the nursing notes.  HISTORY  Chief Complaint  Back Pain  HPI Danny PETRIK Sr. is a 65 y.o. male presents to the ED with complaints of continued midback pain following treatment by his PCP.  He reports bilateral upper back pain without injury, trauma, or accident. He describes duration of several years, with recent presentation with his PCP about 2 weeks prior for treatment. He was given muscle relaxants, but denies significant improvement. He denies chest pain, cough, wheeze, or SOB. He reports his pain at 8/10 in triage.   Past Medical History  Diagnosis Date  . DM2 (diabetes mellitus, type 2) (HCC)   . HLD (hyperlipidemia)   . HTN (hypertension)   . Non-Q wave myocardial infarction (HCC)     mild  . CAD (coronary artery disease)     Patient Active Problem List   Diagnosis Date Noted  . COPD (chronic obstructive pulmonary disease) (HCC) 06/20/2015  . GERD (gastroesophageal reflux disease) 12/20/2014  . Syncope 12/13/2014  . Unstable angina (HCC) 09/22/2014  . Renal insufficiency 02/23/2013  . Angina pectoris (HCC) 10/17/2011  . Diabetes mellitus type 2, uncontrolled, with complications (HCC) 06/05/2010  . Hyperlipidemia 02/10/2010  . HYPERTENSION, BENIGN 02/10/2010  . CAD (coronary artery disease) 02/10/2010    Past Surgical History  Procedure Laterality Date  . Cad: stent to the lad    . Cardiac catheterization  10/01/2014  . Coronary angioplasty with stent placement  10/01/2014    Current Outpatient Rx  Name  Route  Sig  Dispense  Refill  . albuterol (PROVENTIL) (2.5 MG/3ML) 0.083% nebulizer solution   Nebulization   Take 2.5 mg by nebulization every 6 (six) hours as needed for wheezing or shortness of breath.         [name] aspirin 81 MG tablet   Oral   Take 81 mg by mouth daily.            . carvedilol (COREG) 25 MG tablet   Oral   Take 25 mg by mouth 2 (two) times daily with a meal.           . cetirizine (ZYRTEC) 10 MG tablet   Oral   Take 10 mg by mouth daily.         . clopidogrel (PLAVIX) 75 MG tablet   Oral   Take 1 tablet (75 mg total) by mouth daily.   90 tablet   4   . empagliflozin (JARDIANCE) 25 MG TABS tablet   Oral   Take 25 mg by mouth daily.         . insulin glargine (LANTUS) 100 UNIT/ML injection   Subcutaneous   Inject 80 Units into the skin 2 (two) times daily.          . isosorbide mononitrate (IMDUR) 30 MG 24 hr tablet   Oral   Take 1 tablet (30 mg total) by mouth 2 (two) times daily.   60 tablet   6   . linagliptin (TRADJENTA) 5 MG TABS tablet   Oral   Take 5 mg by mouth daily.         . nitroGLYCERIN (NITROSTAT) 0.4 MG SL tablet   Sublingual   Place 0.4 mg under the tongue every 5 (five) minutes as needed.           [name] oxyCODONE (ROXICODONE) 5 MG immediate release tablet  Oral   Take 1 tablet (5 mg total) by mouth every 8 (eight) hours as needed.   20 tablet   0   . pantoprazole (PROTONIX) 40 MG tablet   Oral   Take 1 tablet (40 mg total) by mouth daily.   90 tablet   3   . simvastatin (ZOCOR) 40 MG tablet      TAKE ONE TABLET BY MOUTH AT BEDTIME   90 tablet   3   . traMADol (ULTRAM) 50 MG tablet   Oral   Take 1 tablet (50 mg total) by mouth 2 (two) times daily.   10 tablet   0     Allergies Review of patient's allergies indicates no known allergies.  Family History  Problem Relation Age of Onset  . Heart attack Father     complications    Social History Social History  Substance Use Topics  . Smoking status: Former Smoker -- 3.00 packs/day for 0 years    Types: Cigarettes    Quit date: 09/07/2009  . Smokeless tobacco: None     Comment: quit june 2011  . Alcohol Use: Yes    Review of Systems  Constitutional: Negative for fever. Eyes: Negative for visual changes. ENT: Negative  for sore throat. Cardiovascular: Negative for chest pain. Respiratory: Negative for shortness of breath. Gastrointestinal: Negative for abdominal pain, vomiting and diarrhea. Genitourinary: Negative for dysuria. Musculoskeletal: Positive for back pain. Skin: Negative for rash. Neurological: Negative for headaches, focal weakness or numbness. ____________________________________________  PHYSICAL EXAM:  VITAL SIGNS: ED Triage Vitals  Enc Vitals Group     BP 09/27/15 1424 155/103 mmHg     Pulse Rate 09/27/15 1424 77     Resp 09/27/15 1424 20     Temp 09/27/15 1424 97.4 F (36.3 C)     Temp Source 09/27/15 1424 Oral     SpO2 09/27/15 1424 96 %     Weight 09/27/15 1424 252 lb (114.306 kg)     Height 09/27/15 1424 6' (1.829 m)     Head Cir --      Peak Flow --      Pain Score 09/27/15 1428 8     Pain Loc --      Pain Edu? --      Excl. in GC? --    Constitutional: Alert and oriented. Well appearing and in no distress. Head: Normocephalic and atraumatic. Eyes: Conjunctivae are normal. PERRL. Normal extraocular movements Hematological/Lymphatic/Immunological: No cervical lymphadenopathy. Cardiovascular: Normal rate, regular rhythm.  Respiratory: Normal respiratory effort. No wheezes/rales/rhonchi. Gastrointestinal: Soft and nontender. No distention. No CVA tenderness Musculoskeletal: Patient with tenderness to palp across the thoracolumbar region at the inferior/posterior rib border. Normal rotator cuff testing. Nontender with normal range of motion in all extremities.  Neurologic:  Normal gait without ataxia. Normal speech and language. No gross focal neurologic deficits are appreciated. Skin:  Skin is warm, dry and intact. No rash noted. Psychiatric: Mood and affect are normal. Patient exhibits appropriate insight and judgment. ____________________________________________  INITIAL IMPRESSION / ASSESSMENT AND PLAN / ED COURSE  Patient with generalized thoracolumbar pain,  described as his baseline, chronic pain. He is otherwise found to have a normal exam. He is discharged with a prescription for ultram #10 to dose as directed. He is advised to follow-up with /Dr. Lacie Scotts for ongoing symptom management. Continue home meds as previously directed.  ____________________________________________  FINAL CLINICAL IMPRESSION(S) / ED DIAGNOSES  Final diagnoses:  Thoracolumbar back pain  Chronic lower back  pain     Lissa Hoard, PA-C 09/27/15 1539  Gayla Doss, MD 09/28/15 [phone]

## 2015-09-27 NOTE — ED Notes (Addendum)
Pt to ed with c/o upper back pain x several years, denies recent known injury.  Pt denies difficulty with urination.  States he was seen by pmd for same last week and given muscle relaxers.  Pt states "I need something for the pain, I don't need my muscles relaxed."  Pt reports pain worse with movement and laying flat.

## 2015-09-27 NOTE — Discharge Instructions (Signed)
Chronic Back Pain  When back pain lasts longer than 3 months, it is called chronic back pain.People with chronic back pain often go through certain periods that are more intense (flare-ups).  CAUSES Chronic back pain can be caused by wear and tear (degeneration) on different structures in your back. These structures include:  The bones of your spine (vertebrae) and the joints surrounding your spinal cord and nerve roots (facets).  The strong, fibrous tissues that connect your vertebrae (ligaments). Degeneration of these structures may result in pressure on your nerves. This can lead to constant pain. HOME CARE INSTRUCTIONS  Avoid bending, heavy lifting, prolonged sitting, and activities which make the problem worse.  Take brief periods of rest throughout the day to reduce your pain. Lying down or standing usually is better than sitting while you are resting.  Take over-the-counter or prescription medicines only as directed by your caregiver. SEEK IMMEDIATE MEDICAL CARE IF:   You have weakness or numbness in one of your legs or feet.  You have trouble controlling your bladder or bowels.  You have nausea, vomiting, abdominal pain, shortness of breath, or fainting.   This information is not intended to replace advice given to you by your health care provider. Make sure you discuss any questions you have with your health care provider.   Document Released: 08/30/2004 Document Revised: 10/15/2011 Document Reviewed: 01/10/2015 Elsevier Interactive Patient Education 2016 [employer]  Back Pain, Adult Back pain is very common. The pain often gets better over time. The cause of back pain is usually not dangerous. Most people can learn to manage their back pain on their own.  HOME CARE  Watch your back pain for any changes. The following actions may help to lessen any pain you are feeling:  Stay active. Start with short walks on flat ground if you can. Try to walk farther each  day.  Exercise regularly as told by your doctor. Exercise helps your back heal faster. It also helps avoid future injury by keeping your muscles strong and flexible.  Do not sit, drive, or stand in one place for more than 30 minutes.  Do not stay in bed. Resting more than 1-2 days can slow down your recovery.  Be careful when you bend or lift an object. Use good form when lifting:  Bend at your knees.  Keep the object close to your body.  Do not twist.  Sleep on a firm mattress. Lie on your side, and bend your knees. If you lie on your back, put a pillow under your knees.  Take medicines only as told by your doctor.  Put ice on the injured area.  Put ice in a plastic bag.  Place a towel between your skin and the bag.  Leave the ice on for 20 minutes, 2-3 times a day for the first 2-3 days. After that, you can switch between ice and heat packs.  Avoid feeling anxious or stressed. Find good ways to deal with stress, such as exercise.  Maintain a healthy weight. Extra weight puts stress on your back. GET HELP IF:   You have pain that does not go away with rest or medicine.  You have worsening pain that goes down into your legs or buttocks.  You have pain that does not get better in one week.  You have pain at night.  You lose weight.  You have a fever or chills. GET HELP RIGHT AWAY IF:   You cannot control when you poop (bowel  movement) or pee (urinate).  Your arms or legs feel weak.  Your arms or legs lose feeling (numbness).  You feel sick to your stomach (nauseous) or throw up (vomit).  You have belly (abdominal) pain.  You feel like you may pass out (faint).   This information is not intended to replace advice given to you by your health care provider. Make sure you discuss any questions you have with your health care provider.   Document Released: 01/09/2008 Document Revised: 08/13/2014 Document Reviewed: 11/24/2013 Elsevier Interactive Patient Education  2016 [employer].   Take the prescription pain medicine as needed. Follow-up with Dr. Lacie Scotts as planned.

## 2015-10-06 ENCOUNTER — Emergency Department (HOSPITAL_COMMUNITY)
Admission: EM | Admit: 2015-10-06 | Discharge: 2015-10-06 | Disposition: A | Discharge status: Home / Self Care | Payer: Medicaid Other | Attending: Emergency Medicine | Admitting: Emergency Medicine

## 2015-10-06 DIAGNOSIS — E119 Type 2 diabetes mellitus without complications: Secondary | ICD-10-CM | POA: Insufficient documentation

## 2015-10-06 DIAGNOSIS — Z87891 Personal history of nicotine dependence: Secondary | ICD-10-CM | POA: Diagnosis not present

## 2015-10-06 DIAGNOSIS — E785 Hyperlipidemia, unspecified: Secondary | ICD-10-CM | POA: Diagnosis not present

## 2015-10-06 DIAGNOSIS — Z794 Long term (current) use of insulin: Secondary | ICD-10-CM | POA: Insufficient documentation

## 2015-10-06 DIAGNOSIS — Z9889 Other specified postprocedural states: Secondary | ICD-10-CM | POA: Diagnosis not present

## 2015-10-06 DIAGNOSIS — Z7902 Long term (current) use of antithrombotics/antiplatelets: Secondary | ICD-10-CM | POA: Diagnosis not present

## 2015-10-06 DIAGNOSIS — Z7982 Long term (current) use of aspirin: Secondary | ICD-10-CM | POA: Diagnosis not present

## 2015-10-06 DIAGNOSIS — I252 Old myocardial infarction: Secondary | ICD-10-CM | POA: Diagnosis not present

## 2015-10-06 DIAGNOSIS — Z79899 Other long term (current) drug therapy: Secondary | ICD-10-CM | POA: Insufficient documentation

## 2015-10-06 DIAGNOSIS — M546 Pain in thoracic spine: Secondary | ICD-10-CM | POA: Insufficient documentation

## 2015-10-06 DIAGNOSIS — Z9861 Coronary angioplasty status: Secondary | ICD-10-CM | POA: Diagnosis not present

## 2015-10-06 DIAGNOSIS — I2510 Atherosclerotic heart disease of native coronary artery without angina pectoris: Secondary | ICD-10-CM | POA: Insufficient documentation

## 2015-10-06 DIAGNOSIS — G8929 Other chronic pain: Secondary | ICD-10-CM | POA: Insufficient documentation

## 2015-10-06 DIAGNOSIS — I10 Essential (primary) hypertension: Secondary | ICD-10-CM | POA: Insufficient documentation

## 2015-10-06 DIAGNOSIS — Z7984 Long term (current) use of oral hypoglycemic drugs: Secondary | ICD-10-CM | POA: Diagnosis not present

## 2015-10-06 DIAGNOSIS — M549 Dorsalgia, unspecified: Secondary | ICD-10-CM

## 2015-10-06 MED ORDER — NAPROXEN 500 MG PO TABS
500.0000 mg | ORAL_TABLET | Freq: Two times a day (BID) | ORAL | Status: DC
Start: 2015-10-06 — End: 2016-07-21

## 2015-10-06 MED ORDER — METHOCARBAMOL 500 MG PO TABS: 500.0000 mg | ORAL_TABLET | Freq: Two times a day (BID) | ORAL | Status: DC

## 2015-10-06 NOTE — ED Provider Notes (Signed)
CSN: [id number]     Arrival date & time 10/06/15  1128 History  By signing my name below, I, Danny [name], attest that this documentation has been prepared under the direction and in the presence of Danny Sites, PA-C. Electronically Signed: Phillis [name], ED Scribe. 10/06/2015. 1:51 PM.  Chief Complaint  Patient presents with  . Back Pain   The history is provided by the patient. No language interpreter was used.  HPI Comments: Danny HARIS Sr. is a 65 y.o. Male with a hx of Type II DM, HTN, MI, and CAD who presents to the Emergency Department complaining of worsening of his chronic bilateral middle and upper back pain onset one week ago. He reports that this is a flare up of back pain caused by an injury 15+ years ago. He reports associated pain that radiates to his shoulders, more so on the right.  Patient is right hand dominant. Pt was seen by his PCP who gave him 15 vicodin to mild relief. He states that he also took one of his son's percocet pills which provided the most relief. Pt states that he has since run out and the pain has returned. He reports worsening pain with movement and deep breathing. He denies heavy lifting, injury to the area, chest pain, or SOB.  No numbness or weakness of his extremities.  No bowel or bladder incontinence.  No difficulty walking.  PCP: Danny Croon, MD   Past Medical History  Diagnosis Date  . DM2 (diabetes mellitus, type 2) (HCC)   . HLD (hyperlipidemia)   . HTN (hypertension)   . Non-Q wave myocardial infarction (HCC)     mild  . CAD (coronary artery disease)    Past Surgical History  Procedure Laterality Date  . Cad: stent to the lad    . Cardiac catheterization  10/01/2014  . Coronary angioplasty with stent placement  10/01/2014   Family History  Problem Relation Age of Onset  . Heart attack Father     complications   Social History  Substance Use Topics  . Smoking status: Former Smoker -- 3.00 packs/day for 0 years    Types:  Cigarettes    Quit date: 09/07/2009  . Smokeless tobacco: Not on file     Comment: quit june 2011  . Alcohol Use: Yes    Review of Systems  Respiratory: Negative for shortness of breath.   Cardiovascular: Negative for chest pain.  Musculoskeletal: Positive for back pain and arthralgias.  All other systems reviewed and are negative.  Allergies  Review of patient's allergies indicates no known allergies.  Home Medications   Prior to Admission medications   Medication Sig Start Date End Date Taking? Authorizing Provider  albuterol (PROVENTIL) (2.5 MG/3ML) 0.083% nebulizer solution Take 2.5 mg by nebulization every 6 (six) hours as needed for wheezing or shortness of breath.    Historical Provider, MD  aspirin 81 MG tablet Take 81 mg by mouth daily.      Historical Provider, MD  carvedilol (COREG) 25 MG tablet Take 25 mg by mouth 2 (two) times daily with a meal.      Historical Provider, MD  cetirizine (ZYRTEC) 10 MG tablet Take 10 mg by mouth daily.    Historical Provider, MD  clopidogrel (PLAVIX) 75 MG tablet Take 1 tablet (75 mg total) by mouth daily. 06/20/15   Danny Iba, MD  empagliflozin (JARDIANCE) 25 MG TABS tablet Take 25 mg by mouth daily.    Historical Provider, MD  insulin glargine (LANTUS) 100 UNIT/ML injection Inject 80 Units into the skin 2 (two) times daily.     Historical Provider, MD  isosorbide mononitrate (IMDUR) 30 MG 24 hr tablet Take 1 tablet (30 mg total) by mouth 2 (two) times daily. 06/20/15   Danny Iba, MD  linagliptin (TRADJENTA) 5 MG TABS tablet Take 5 mg by mouth daily.    Historical Provider, MD  nitroGLYCERIN (NITROSTAT) 0.4 MG SL tablet Place 0.4 mg under the tongue every 5 (five) minutes as needed.      Historical Provider, MD  oxyCODONE (ROXICODONE) 5 MG immediate release tablet Take 1 tablet (5 mg total) by mouth every 8 (eight) hours as needed. 05/15/15 05/14/16  Danny Antis, MD  pantoprazole (PROTONIX) 40 MG tablet Take 1 tablet (40  mg total) by mouth daily. 12/20/14   Danny Iba, MD  simvastatin (ZOCOR) 40 MG tablet TAKE ONE TABLET BY MOUTH AT BEDTIME 06/20/15   Danny Iba, MD  traMADol (ULTRAM) 50 MG tablet Take 1 tablet (50 mg total) by mouth 2 (two) times daily. 09/27/15   Danny V Bacon Menshew, PA-C   BP 176/99 mmHg  Pulse 87  Temp(Src) 97.9 F (36.6 C) (Oral)  Resp 18  Ht 6' (1.829 m)  Wt 252 lb (114.306 kg)  BMI 34.17 kg/m2  SpO2 93%   Physical Exam  Constitutional: He is oriented to person, place, and time. He appears well-developed and well-nourished. No distress.  HENT:  Head: Normocephalic and atraumatic.  Mouth/Throat: Oropharynx is clear and moist.  Eyes: Conjunctivae and EOM are normal. Pupils are equal, round, and reactive to light.  Neck: Normal range of motion. Neck supple.  Cardiovascular: Normal rate, regular rhythm and normal heart sounds.   Pulmonary/Chest: Effort normal and breath sounds normal. No respiratory distress. He has no wheezes.  Musculoskeletal: Normal range of motion.       Arms: TTP of thoracic paraspinal regions bilaterally; no mid-line tenderness or deformities; full ROM maintained; normal strength and sensation of BUE; normal ROM of both shoulders; normal grip strength; normal gait  Neurological: He is alert and oriented to person, place, and time.  Skin: Skin is warm and dry. He is not diaphoretic.  Psychiatric: He has a normal mood and affect.  Nursing note and vitals reviewed.   ED Course  Procedures (including critical care time) DIAGNOSTIC STUDIES: Oxygen Saturation is 93% on RA, low by my interpretation.    COORDINATION OF CARE: 1:49 PM-Discussed treatment plan which includes follow up with PCP. with pt at bedside and pt agreed to plan.    Labs Review Labs Reviewed - No data to display  Imaging Review No results found. I have personally reviewed and evaluated these images and lab results as part of my medical decision-making.   EKG  Interpretation None      MDM   Final diagnoses:  Chronic back pain   65 year old male here with acute on chronic back pain. He has had this pain for the last 15 years. He denies any recent injury, trauma, falls, or heavy lifting. He has been seen 3 times in the past 2 weeks for similar symptoms. He reports only from the pain got better was when he took his son's Percocet. Patient is afebrile, nontoxic. He has tenderness of his thoracic paraspinal regions bilaterally but there is no midline tenderness or deformity. He maintains full range of motion of his back as well as bilateral shoulders. His extremities are neurovascularly intact with normal gait.  No deficits noted to suggest central cord syndrome, cauda equina, SCI, epidural abscess, or other emergent pathology.  No chest pain or SOB.  At this time given chronic nature of his symptoms, do not feel he needs emergent imaging.  Patient will be discharged home with supportive care including Naprosyn and Robaxin. He was encouraged to follow-up with his primary care physician.  Discussed plan with patient, he/she acknowledged understanding and agreed with plan of care.  Return precautions given for new or worsening symptoms.  I personally performed the services described in this documentation, which was scribed in my presence. The recorded information has been reviewed and is accurate.  Garlon Hatchet, PA-C 10/06/15 1442  Garlon Hatchet, PA-C 10/06/15 1445  Azalia Bilis, MD 10/06/15 [phone]

## 2015-10-06 NOTE — ED Notes (Addendum)
He c/o 1 week history of constant mid/upper back pain. His PCP gave him vicodin with some relief of pain but he ran otu and now the pain is back. Pain increased with movement. He doesn't remember heavy lifting or exertion prior to onset of pain. He denies any CP or sob. Ambulatory, mae

## 2015-10-06 NOTE — Discharge Instructions (Signed)
Take the prescribed medication as directed. °Follow-up with your primary care doctor. °Return to the ED for new or worsening symptoms. °

## 2015-10-24 ENCOUNTER — Other Ambulatory Visit: Payer: Self-pay | Admitting: Anesthesiology

## 2015-10-24 DIAGNOSIS — M545 Low back pain: Secondary | ICD-10-CM

## 2015-10-24 DIAGNOSIS — M5415 Radiculopathy, thoracolumbar region: Secondary | ICD-10-CM

## 2015-11-15 ENCOUNTER — Ambulatory Visit: Payer: Medicaid Other

## 2015-12-21 ENCOUNTER — Encounter: Payer: Self-pay | Admitting: Cardiovascular Disease

## 2015-12-21 ENCOUNTER — Ambulatory Visit (INDEPENDENT_AMBULATORY_CARE_PROVIDER_SITE_OTHER): Payer: Medicaid Other | Admitting: Cardiovascular Disease

## 2015-12-21 VITALS — BP 132/92 | HR 86 | Ht 72.0 in | Wt 249.8 lb

## 2015-12-21 DIAGNOSIS — I10 Essential (primary) hypertension: Secondary | ICD-10-CM | POA: Diagnosis not present

## 2015-12-21 DIAGNOSIS — Z794 Long term (current) use of insulin: Secondary | ICD-10-CM

## 2015-12-21 DIAGNOSIS — I200 Unstable angina: Secondary | ICD-10-CM

## 2015-12-21 DIAGNOSIS — E118 Type 2 diabetes mellitus with unspecified complications: Secondary | ICD-10-CM

## 2015-12-21 DIAGNOSIS — E1165 Type 2 diabetes mellitus with hyperglycemia: Secondary | ICD-10-CM

## 2015-12-21 DIAGNOSIS — J432 Centrilobular emphysema: Secondary | ICD-10-CM

## 2015-12-21 DIAGNOSIS — E785 Hyperlipidemia, unspecified: Secondary | ICD-10-CM

## 2015-12-21 DIAGNOSIS — IMO0002 Reserved for concepts with insufficient information to code with codable children: Secondary | ICD-10-CM

## 2015-12-21 NOTE — Assessment & Plan Note (Signed)
He denies any recent chest pain symptoms No further workup at this time Stressed importance of aggressive diabetes, cholesterol control Currently a nonsmoker

## 2015-12-21 NOTE — Patient Instructions (Signed)
You are doing well. No medication changes were made.  Please call us if you have new issues that need to be addressed before your next appt.  Your physician wants you to follow-up in: 6 months.  You will receive a reminder letter in the mail two months in advance. If you don't receive a letter, please call our office to schedule the follow-up appointment.   

## 2015-12-21 NOTE — Assessment & Plan Note (Signed)
We have stressed the importance of aggressive cholesterol control, no lab work available This is done through primary care Goal LDL less than 70

## 2015-12-21 NOTE — Assessment & Plan Note (Signed)
We have encouraged continued exercise, careful diet management in an effort to lose weight. 

## 2015-12-21 NOTE — Assessment & Plan Note (Signed)
Blood pressure mildly elevated today but he reports he did not take his medications, ate a large biscuit this morning

## 2015-12-21 NOTE — Progress Notes (Signed)
Patient ID: Danny Scrape Sr., male    DOB: September 08, 1950, 65 y.o.   MRN: [id number]  HPI Comments: 65 year old gentleman with a long smoking history, history of coronary artery disease, cardiac catheterization showing severe two-vessel disease, severe stenosis of the proximal to mid LAD, severe ostial O1 and O2 disease, moderate to severe mid left circumflex disease that appeared aneurysmal, moderate OM 2 disease ejection fraction 40% who had a PCI of the LAD on January 11, 2010, who used to smoke 4 packs a day who stopped earlier in 2011, history of diabetes, hyperlipidemia who presents for routine followup of his coronary artery disease. Chronic renal insufficiency, poorly controlled diabetes on insulin, most recent lab work not available  In follow-up today, he reports that he feels well with no complaints Chronic mild shortness of breath which is stable, denies any chest pain He sees primary care, Dr. Lacie Scotts every 3 months, lab work done at that time He was told by primary care that lab work looked good, details not available Reports that he ate a large biscuit this morning, did not take his medications despite morning Denies any significant lower extremity edema, no PND or orthopnea Somewhat unsure about his medications, thinks he is taking them all Weight is up, eating poorly, no exercise, poor diet in general.   EKG on today's visit shows normal sinus rhythm with rate 86 bpm, nonspecific ST and T wave abnormality anterolateral leads, APCs noted  Other past medical history Prior to his last clinic visit, he had 2 bags of potato chips. He woke up later overnight with significant chest discomfort/upper epigastric pain. He took nitroglycerin which helped his symptoms for 30 minutes but pain returned. He took several nitroglycerin through the night. Symptoms better the next day, Sunday and feels well today. He has had GI issues before with symptoms of GERD. He reports that PPI was stopped by  primary care some time ago. Prior episodes of syncope, was sitting at the time, witnessed by a friend. Resolved quickly with no postictal changes. No symptoms since that time several years ago  He was seen in the hospital 11/23/2014 with elevated glucose of more than 300, also with creatinine of 2, BUN 40. He was given saline IV and discharged home.  lab work from January 2016 shows creatinine 1.67, BUN 33, glucose 520, hemoglobin A1c 14, total cholesterol 168, LDL 71   No Known Allergies  Outpatient Encounter Prescriptions as of 12/21/2015  Medication Sig  . albuterol (PROVENTIL) (2.5 MG/3ML) 0.083% nebulizer solution Take 2.5 mg by nebulization every 6 (six) hours as needed for wheezing or shortness of breath.  [name] aspirin 81 MG tablet Take 81 mg by mouth daily.    . carvedilol (COREG) 25 MG tablet Take 25 mg by mouth 2 (two) times daily with a meal.    . cetirizine (ZYRTEC) 10 MG tablet Take 10 mg by mouth daily.  . clopidogrel (PLAVIX) 75 MG tablet Take 1 tablet (75 mg total) by mouth daily.  . empagliflozin (JARDIANCE) 25 MG TABS tablet Take 25 mg by mouth daily.  . insulin glargine (LANTUS) 100 UNIT/ML injection Inject 80 Units into the skin 2 (two) times daily.   . isosorbide mononitrate (IMDUR) 30 MG 24 hr tablet Take 1 tablet (30 mg total) by mouth 2 (two) times daily.  [name] linagliptin (TRADJENTA) 5 MG TABS tablet Take 5 mg by mouth daily.  . methocarbamol (ROBAXIN) 500 MG tablet Take 1 tablet (500 mg total) by mouth 2 (two)  times daily.  . naproxen (NAPROSYN) 500 MG tablet Take 1 tablet (500 mg total) by mouth 2 (two) times daily with a meal.  . nitroGLYCERIN (NITROSTAT) 0.4 MG SL tablet Place 0.4 mg under the tongue every 5 (five) minutes as needed.    . pantoprazole (PROTONIX) 40 MG tablet Take 1 tablet (40 mg total) by mouth daily.  . simvastatin (ZOCOR) 40 MG tablet TAKE ONE TABLET BY MOUTH AT BEDTIME  . traMADol (ULTRAM) 50 MG tablet Take 1 tablet (50 mg total) by mouth 2 (two)  times daily.  . [DISCONTINUED] oxyCODONE (ROXICODONE) 5 MG immediate release tablet Take 1 tablet (5 mg total) by mouth every 8 (eight) hours as needed.   No facility-administered encounter medications on file as of 12/21/2015.    Past Medical History  Diagnosis Date  . DM2 (diabetes mellitus, type 2) (HCC)   . HLD (hyperlipidemia)   . HTN (hypertension)   . Non-Q wave myocardial infarction (HCC)     mild  . CAD (coronary artery disease)     Past Surgical History  Procedure Laterality Date  . Cad: stent to the lad    . Cardiac catheterization  10/01/2014  . Coronary angioplasty with stent placement  10/01/2014    Social History  reports that he quit smoking about 6 years ago. His smoking use included Cigarettes. He smoked 3.00 packs per day for 0 years. He does not have any smokeless tobacco history on file. He reports that he drinks alcohol. He reports that he does not use illicit drugs.  Family History family history includes Heart attack in his father.  Review of Systems  Constitutional: Negative.   Respiratory: Positive for shortness of breath.   Cardiovascular: Negative.   Gastrointestinal: Negative.   Musculoskeletal: Negative.   Neurological: Negative.   Hematological: Negative.   Psychiatric/Behavioral: Negative.   All other systems reviewed and are negative.   BP 132/92 mmHg  Pulse 86  Ht 6' (1.829 m)  Wt 249 lb 12 oz (113.286 kg)  BMI 33.86 kg/m2  Physical Exam  Constitutional: He is oriented to person, place, and time. He appears well-developed and well-nourished.  obese  HENT:  Head: Normocephalic.  Nose: Nose normal.  Mouth/Throat: Oropharynx is clear and moist.  Eyes: Conjunctivae are normal. Pupils are equal, round, and reactive to light.  Neck: Normal range of motion. Neck supple. No JVD present.  Cardiovascular: Normal rate, regular rhythm, S1 normal, S2 normal, normal heart sounds and intact distal pulses.  Exam reveals no gallop and no friction  rub.   No murmur heard. Pulmonary/Chest: Effort normal. No respiratory distress. He has decreased breath sounds. He has no wheezes. He has no rales. He exhibits no tenderness.  Abdominal: Soft. Bowel sounds are normal. He exhibits no distension. There is no tenderness.  Musculoskeletal: Normal range of motion. He exhibits no edema or tenderness.  Lymphadenopathy:    He has no cervical adenopathy.  Neurological: He is alert and oriented to person, place, and time. Coordination normal.  Skin: Skin is warm and dry. No rash noted. No erythema.  Psychiatric: He has a normal mood and affect. His behavior is normal. Judgment and thought content normal.      Assessment and Plan   Nursing note and vitals reviewed.

## 2015-12-21 NOTE — Assessment & Plan Note (Signed)
Currently not on inhalers, chronic mild shortness of breath which she reports is stable Nonsmoker

## 2016-03-27 ENCOUNTER — Encounter: Payer: Self-pay | Admitting: Medical Oncology

## 2016-03-27 ENCOUNTER — Emergency Department
Admission: EM | Admit: 2016-03-27 | Discharge: 2016-03-27 | Disposition: A | Discharge status: Home / Self Care | Payer: Medicare Other | Attending: Student | Admitting: Student

## 2016-03-27 DIAGNOSIS — Z794 Long term (current) use of insulin: Secondary | ICD-10-CM | POA: Insufficient documentation

## 2016-03-27 DIAGNOSIS — J449 Chronic obstructive pulmonary disease, unspecified: Secondary | ICD-10-CM | POA: Insufficient documentation

## 2016-03-27 DIAGNOSIS — Z87891 Personal history of nicotine dependence: Secondary | ICD-10-CM | POA: Diagnosis not present

## 2016-03-27 DIAGNOSIS — Z7982 Long term (current) use of aspirin: Secondary | ICD-10-CM | POA: Insufficient documentation

## 2016-03-27 DIAGNOSIS — I10 Essential (primary) hypertension: Secondary | ICD-10-CM | POA: Insufficient documentation

## 2016-03-27 DIAGNOSIS — G8929 Other chronic pain: Secondary | ICD-10-CM | POA: Insufficient documentation

## 2016-03-27 DIAGNOSIS — M62830 Muscle spasm of back: Secondary | ICD-10-CM | POA: Diagnosis not present

## 2016-03-27 DIAGNOSIS — M545 Low back pain: Secondary | ICD-10-CM | POA: Diagnosis present

## 2016-03-27 DIAGNOSIS — I2510 Atherosclerotic heart disease of native coronary artery without angina pectoris: Secondary | ICD-10-CM | POA: Insufficient documentation

## 2016-03-27 DIAGNOSIS — Z7984 Long term (current) use of oral hypoglycemic drugs: Secondary | ICD-10-CM | POA: Insufficient documentation

## 2016-03-27 DIAGNOSIS — E119 Type 2 diabetes mellitus without complications: Secondary | ICD-10-CM | POA: Diagnosis not present

## 2016-03-27 DIAGNOSIS — M549 Dorsalgia, unspecified: Secondary | ICD-10-CM

## 2016-03-27 LAB — URINALYSIS COMPLETE WITH MICROSCOPIC (ARMC ONLY)
Bacteria, UA: NONE SEEN
Bilirubin Urine: NEGATIVE
Glucose, UA: >500 mg/dL — AB
Hgb urine dipstick: NEGATIVE
Ketones, ur: NEGATIVE mg/dL
Leukocytes, UA: NEGATIVE
Nitrite: NEGATIVE
Protein, ur: NEGATIVE mg/dL
RBC / HPF: NONE SEEN RBC/hpf (ref 0–5)
Specific Gravity, Urine: 1.023 (ref 1.005–1.030)
pH: 5 (ref 5.0–8.0)

## 2016-03-27 MED ORDER — CYCLOBENZAPRINE HCL 5 MG PO TABS: 5.0000 mg | ORAL_TABLET | Freq: Three times a day (TID) | ORAL | 0 refills | Status: DC | PRN

## 2016-03-27 MED ORDER — TRAMADOL HCL 50 MG PO TABS: 50.0000 mg | ORAL_TABLET | Freq: Four times a day (QID) | ORAL | 0 refills | Status: DC | PRN

## 2016-03-27 NOTE — ED Triage Notes (Signed)
Pt reports hx of lower back pain off and on for 10 years, pain worsened this am. Pt ambulatory.

## 2016-03-27 NOTE — ED Notes (Signed)
Discharge instructions reviewed with patient. Patient verbalized understanding. Patient ambulated to lobby without difficulty.   

## 2016-03-27 NOTE — Discharge Instructions (Signed)
Take the prescription meds as directed. Follow-up with your provider for continued symptoms and management of your chronic pain.

## 2016-03-27 NOTE — ED Provider Notes (Signed)
[facility] Emergency Department Provider Note ____________________________________________  Time seen: [id number]  I have reviewed the triage vital signs and the nursing notes.  HISTORY  Chief Complaint  Back Pain  HPI Danny ScrapeRobert M Schoonmaker Sr. is a 65 y.o. male who presents to the ED, dropped off by his adult child for evaluation management of flare of his chronic back pain. Patient gives a history of a 10 year or better course of intermittent, chronic back pain. He is currently being managed and followed byhis primary care provider. He describes pain to the right side of lower back without recent injury, accident, or,. He describes onset occurred while sitting at the pool yesterday. He denies any hematuria, dysuria, or distal paresthesias.  Past Medical History:  Diagnosis Date  . CAD (coronary artery disease)   . DM2 (diabetes mellitus, type 2) (HCC)   . HLD (hyperlipidemia)   . HTN (hypertension)   . Non-Q wave myocardial infarction [name]HCC)    mild    Patient Active Problem List   Diagnosis Date Noted  . COPD (chronic obstructive pulmonary disease) (HCC) 06/20/2015  . GERD (gastroesophageal reflux disease) 12/20/2014  . Syncope 12/13/2014  . Unstable angina (HCC) 09/22/2014  . Renal insufficiency 02/23/2013  . Angina pectoris (HCC) 10/17/2011  . Diabetes mellitus type 2, uncontrolled, with complications (HCC) 06/05/2010  . Hyperlipidemia 02/10/2010  . HYPERTENSION, BENIGN 02/10/2010  . CAD (coronary artery disease) 02/10/2010    Past Surgical History:  Procedure Laterality Date  . CAD: stent to the LAD    . CARDIAC CATHETERIZATION  10/01/2014  . CORONARY ANGIOPLASTY WITH STENT PLACEMENT  10/01/2014    Prior to Admission medications   Medication Sig Start Date End Date Taking? Authorizing Provider  albuterol (PROVENTIL) (2.5 MG/3ML) 0.083% nebulizer solution Take 2.5 mg by nebulization every 6 (six) hours as needed for wheezing or shortness of breath.     Historical Provider, MD  aspirin 81 MG tablet Take 81 mg by mouth daily.      Historical Provider, MD  carvedilol (COREG) 25 MG tablet Take 25 mg by mouth 2 (two) times daily with a meal.      Historical Provider, MD  cetirizine (ZYRTEC) 10 MG tablet Take 10 mg by mouth daily.    Historical Provider, MD  clopidogrel (PLAVIX) 75 MG tablet Take 1 tablet (75 mg total) by mouth daily. 06/20/15   Antonieta Ibaimothy J Gollan, MD  cyclobenzaprine (FLEXERIL) 5 MG tablet Take 1 tablet (5 mg total) by mouth 3 (three) times daily as needed for muscle spasms. 03/27/16   Luann Aspinwall V Bacon Rami Budhu, PA-C  empagliflozin (JARDIANCE) 25 MG TABS tablet Take 25 mg by mouth daily.    Historical Provider, MD  insulin glargine (LANTUS) 100 UNIT/ML injection Inject 80 Units into the skin 2 (two) times daily.     Historical Provider, MD  isosorbide mononitrate (IMDUR) 30 MG 24 hr tablet Take 1 tablet (30 mg total) by mouth 2 (two) times daily. 06/20/15   Antonieta Ibaimothy J Gollan, MD  linagliptin (TRADJENTA) 5 MG TABS tablet Take 5 mg by mouth daily.    Historical Provider, MD  methocarbamol (ROBAXIN) 500 MG tablet Take 1 tablet (500 mg total) by mouth 2 (two) times daily. 10/06/15   Garlon HatchetLisa M Sanders, PA-C  naproxen (NAPROSYN) 500 MG tablet Take 1 tablet (500 mg total) by mouth 2 (two) times daily with a meal. 10/06/15   Garlon HatchetLisa M Sanders, PA-C  nitroGLYCERIN (NITROSTAT) 0.4 MG SL tablet Place 0.4 mg under the  tongue every 5 (five) minutes as needed.      Historical Provider, MD  pantoprazole (PROTONIX) 40 MG tablet Take 1 tablet (40 mg total) by mouth daily. 12/20/14   Antonieta Ibaimothy J Gollan, MD  simvastatin (ZOCOR) 40 MG tablet TAKE ONE TABLET BY MOUTH AT BEDTIME 06/20/15   Antonieta Ibaimothy J Gollan, MD  traMADol (ULTRAM) 50 MG tablet Take 1 tablet (50 mg total) by mouth every 6 (six) hours as needed. 03/27/16   Jleigh Striplin V Bacon Aluel Schwarz, PA-C   Allergies Review of patient's allergies indicates no known allergies.  Family History  Problem Relation Age of Onset  . Heart  attack Father     complications    Social History Social History  Substance Use Topics  . Smoking status: Former Smoker    Packs/day: 3.00    Years: 0.00    Types: Cigarettes    Quit date: 09/07/2009  . Smokeless tobacco: Not on file     Comment: quit june 2011  . Alcohol use Yes   Review of Systems  Constitutional: Negative for fever. Cardiovascular: Negative for chest pain. Respiratory: Negative for shortness of breath. Gastrointestinal: Negative for abdominal pain, vomiting and diarrhea. Genitourinary: Negative for dysuria, hematuria Musculoskeletal: Positive for back pain. Neurological: Negative for headaches, focal weakness or numbness. ____________________________________________  PHYSICAL EXAM:  VITAL SIGNS: ED Triage Vitals  Enc Vitals Group     BP 03/27/16 1848 (!) 160/92     Pulse Rate 03/27/16 1848 85     Resp 03/27/16 1848 18     Temp 03/27/16 1848 97.9 F (36.6 C)     Temp Source 03/27/16 1848 Oral     SpO2 03/27/16 1848 96 %     Weight 03/27/16 1849 243 lb (110.2 kg)     Height 03/27/16 1849 6' (1.829 m)     Head Circumference --      Peak Flow --      Pain Score 03/27/16 1849 10     Pain Loc --      Pain Edu? --      Excl. in GC? --    Constitutional: Alert and oriented. Well appearing and in no distress. Head: Normocephalic and atraumatic. Cardiovascular: Normal rate, regular rhythm.  Respiratory: Normal respiratory effort. No wheezes/rales/rhonchi. Gastrointestinal: Soft and nontender. No distention. Musculoskeletal: Normal spinal alignment without midline tenderness, spasm, deformity, or step-off. Negative seated history leg raise bilaterally. Minimally tender to palpation over sided lumbar sacral musculature. Normal transition from sit to stand without difficulty. Nontender with normal range of motion in all extremities.  Neurologic:  Normal gait without ataxia. Normal speech and language. No gross focal neurologic deficits are  appreciated. Skin:  Skin is warm, dry and intact. No rash noted. Psychiatric: Mood and affect are normal. Patient exhibits appropriate insight and judgment. ____________________________________________   LABS (pertinent positives/negatives) Labs Reviewed  URINALYSIS COMPLETEWITH MICROSCOPIC ([facility] ONLY) - Abnormal; Notable for the following:       Result Value   Color, Urine YELLOW (*)    APPearance CLEAR (*)    Glucose, UA >500 (*)    Squamous Epithelial / LPF 0-5 (*)    All other components within normal limits  ____________________________________________   RADIOLOGY  Not indicated ____________________________________________  PROCEDURES  Tramadol 50 mg PO Flexeril 5 mg PO ____________________________________________  INITIAL IMPRESSION / ASSESSMENT AND PLAN / ED COURSE  Patient with acute flare of his chronic back pain and lumbar sacral muscle strain. He will be discharged with a prescription for cyclobenzaprine  and tramadol doses directed. He will follow up with his primary care provider for ongoing symptom management.  Clinical Course   ____________________________________________  FINAL CLINICAL IMPRESSION(S) / ED DIAGNOSES  Final diagnoses:  Chronic back pain  Muscle spasm of back     Lissa Hoard, PA-C 03/31/16 0830    Gayla Doss, MD 04/02/16 [phone]

## 2016-05-01 ENCOUNTER — Emergency Department
Admission: EM | Admit: 2016-05-01 | Discharge: 2016-05-01 | Disposition: A | Discharge status: Home / Self Care | Payer: Medicare Other | Attending: Emergency Medicine | Admitting: Emergency Medicine

## 2016-05-01 DIAGNOSIS — R358 Other polyuria: Secondary | ICD-10-CM | POA: Diagnosis not present

## 2016-05-01 DIAGNOSIS — Z87891 Personal history of nicotine dependence: Secondary | ICD-10-CM | POA: Insufficient documentation

## 2016-05-01 DIAGNOSIS — Z7982 Long term (current) use of aspirin: Secondary | ICD-10-CM | POA: Insufficient documentation

## 2016-05-01 DIAGNOSIS — E119 Type 2 diabetes mellitus without complications: Secondary | ICD-10-CM | POA: Diagnosis not present

## 2016-05-01 DIAGNOSIS — R0789 Other chest pain: Secondary | ICD-10-CM | POA: Insufficient documentation

## 2016-05-01 DIAGNOSIS — Z9861 Coronary angioplasty status: Secondary | ICD-10-CM | POA: Diagnosis not present

## 2016-05-01 DIAGNOSIS — J449 Chronic obstructive pulmonary disease, unspecified: Secondary | ICD-10-CM | POA: Insufficient documentation

## 2016-05-01 DIAGNOSIS — Z794 Long term (current) use of insulin: Secondary | ICD-10-CM | POA: Diagnosis not present

## 2016-05-01 DIAGNOSIS — I2510 Atherosclerotic heart disease of native coronary artery without angina pectoris: Secondary | ICD-10-CM | POA: Insufficient documentation

## 2016-05-01 DIAGNOSIS — Z79899 Other long term (current) drug therapy: Secondary | ICD-10-CM | POA: Insufficient documentation

## 2016-05-01 DIAGNOSIS — Z7984 Long term (current) use of oral hypoglycemic drugs: Secondary | ICD-10-CM | POA: Diagnosis not present

## 2016-05-01 DIAGNOSIS — Z791 Long term (current) use of non-steroidal anti-inflammatories (NSAID): Secondary | ICD-10-CM | POA: Insufficient documentation

## 2016-05-01 DIAGNOSIS — I10 Essential (primary) hypertension: Secondary | ICD-10-CM | POA: Diagnosis not present

## 2016-05-01 HISTORY — DX: Chronic obstructive pulmonary disease, unspecified: J44.9

## 2016-05-01 LAB — COMPREHENSIVE METABOLIC PANEL
ALT: 26 U/L (ref 17–63)
AST: 21 U/L (ref 15–41)
Albumin: 4 g/dL (ref 3.5–5.0)
Alkaline Phosphatase: 117 U/L (ref 38–126)
Anion gap: 8 (ref 5–15)
BUN: 35 mg/dL — ABNORMAL HIGH (ref 6–20)
CO2: 23 mmol/L (ref 22–32)
Calcium: 9.1 mg/dL (ref 8.9–10.3)
Chloride: 102 mmol/L (ref 101–111)
Creatinine, Ser: 1.48 mg/dL — ABNORMAL HIGH (ref 0.61–1.24)
GFR calc Af Amer: 56 mL/min — ABNORMAL LOW (ref >60)
GFR calc non Af Amer: 48 mL/min — ABNORMAL LOW (ref >60)
Glucose, Bld: 443 mg/dL — ABNORMAL HIGH (ref 65–99)
Potassium: 4.4 mmol/L (ref 3.5–5.1)
Sodium: 133 mmol/L — ABNORMAL LOW (ref 135–145)
Total Bilirubin: 0.6 mg/dL (ref 0.3–1.2)
Total Protein: 7.2 g/dL (ref 6.5–8.1)

## 2016-05-01 LAB — TROPONIN I
Troponin I: <0.03 ng/mL (ref <0.03)
Troponin I: <0.03 ng/mL (ref <0.03)

## 2016-05-01 MED ORDER — SODIUM CHLORIDE 0.9 % IV BOLUS (SEPSIS)
1000.0000 mL | Freq: Once | INTRAVENOUS | Status: AC
  Administered 2016-05-01: 1000 mL via INTRAVENOUS
  Filled 2016-05-01: qty 1000

## 2016-05-01 MED ORDER — GI COCKTAIL ~~LOC~~
30.0000 mL | ORAL | Status: AC
  Administered 2016-05-01: 30 mL via ORAL
  Filled 2016-05-01: qty 30

## 2016-05-01 NOTE — ED Provider Notes (Signed)
This patient was signed out to me by Dr. Alfonse FlavorsPhilip Stafford. This is a 65 year old woman who came in with atypical chest pain. He has had a repeat troponin which is negative and is a symptomatically at this time. I didn't see the patient, and he reports that he has not taken any of his medications this morning. I see that he is on carvedilol, and have encouraged him to take this as soon as he gets home to improve his mild hypertension and heart rate between 101 and 103 that is in sinus on the monitor. In addition, the patient is "supposed to" where oxygen at home but does not have any. Here, without oxygen, his oxygen saturations remained above 92%. I've encouraged him to reestablish home oxygen use through his primary care physician in the next 1-2 days. There is no evidence of an acute emergency requiring admission to the hospital at this time, and the patient is stable for discharge.   Rockne MenghiniAnne-Caroline Kierra Jezewski, MD 05/01/16 1656

## 2016-05-01 NOTE — ED Provider Notes (Signed)
[facility] Emergency Department Provider Note  ____________________________________________  Time seen: Approximately 2:21 PM  I have reviewed the triage vital signs and the nursing notes.   HISTORY  Chief Complaint Chest Pain    HPI Danny WILLETTE Sr. is a 65 y.o. male with diabetes was not taken his insulin or oral diabetes medications today who was sitting outside on a bench drinking a Coke when he had an onset of mid chest pain. It's described as soreness, no aggravating or alleviating factors. Not exertional, not pleuritic. Nonradiating. He took nitroglycerin 2 as well as aspirin 324 given by EMS, and pain is still present, but it has diminished. It was moderate intensity when it started at 12:53 PM, and is now about 2 out of 10.     Past Medical History:  Diagnosis Date  . CAD (coronary artery disease)   . COPD (chronic obstructive pulmonary disease) (HCC)   . DM2 (diabetes mellitus, type 2) (HCC)   . HLD (hyperlipidemia)   . HTN (hypertension)   . Non-Q wave myocardial infarction [facility])    mild     Patient Active Problem List   Diagnosis Date Noted  . COPD (chronic obstructive pulmonary disease) (HCC) 06/20/2015  . GERD (gastroesophageal reflux disease) 12/20/2014  . Syncope 12/13/2014  . Unstable angina (HCC) 09/22/2014  . Renal insufficiency 02/23/2013  . Angina pectoris (HCC) 10/17/2011  . Diabetes mellitus type 2, uncontrolled, with complications (HCC) 06/05/2010  . Hyperlipidemia 02/10/2010  . HYPERTENSION, BENIGN 02/10/2010  . CAD (coronary artery disease) 02/10/2010     Past Surgical History:  Procedure Laterality Date  . CAD: stent to the LAD    . CARDIAC CATHETERIZATION  10/01/2014  . CORONARY ANGIOPLASTY WITH STENT PLACEMENT  10/01/2014     Prior to Admission medications   Medication Sig Start Date End Date Taking? Authorizing Provider  albuterol (PROVENTIL) (2.5 MG/3ML) 0.083% nebulizer solution Take 2.5 mg by  nebulization every 6 (six) hours as needed for wheezing or shortness of breath.    Historical Provider, Danny [name]  aspirin 81 MG tablet Take 81 mg by mouth daily.      Historical Provider, Danny [name]  carvedilol (COREG) 25 MG tablet Take 25 mg by mouth 2 (two) times daily with a meal.      Historical Provider, Danny [name]  cetirizine (ZYRTEC) 10 MG tablet Take 10 mg by mouth daily.    Historical Provider, Danny [name]  clopidogrel (PLAVIX) 75 MG tablet Take 1 tablet (75 mg total) by mouth daily. 06/20/15   [name], Danny [name]  cyclobenzaprine (FLEXERIL) 5 MG tablet Take 1 tablet (5 mg total) by mouth 3 (three) times daily as needed for muscle spasms. 03/27/16   [name], PA-C  empagliflozin (JARDIANCE) 25 MG TABS tablet Take 25 mg by mouth daily.    Historical Provider, Danny [name]  insulin glargine (LANTUS) 100 UNIT/ML injection Inject 80 Units into the skin 2 (two) times daily.     Historical Provider, Danny [name]  isosorbide mononitrate (IMDUR) 30 MG 24 hr tablet Take 1 tablet (30 mg total) by mouth 2 (two) times daily. 06/20/15   [name], Danny [name]  linagliptin (TRADJENTA) 5 MG TABS tablet Take 5 mg by mouth daily.    Historical Provider, Danny [name]  methocarbamol (ROBAXIN) 500 MG tablet Take 1 tablet (500 mg total) by mouth 2 (two) times daily. 10/06/15   Garlon Hatchet, PA-C  naproxen (NAPROSYN) 500 MG tablet Take 1 tablet (500 mg total) by mouth 2 (two) times  daily with a meal. 10/06/15   Garlon Hatchet, PA-C  nitroGLYCERIN (NITROSTAT) 0.4 MG SL tablet Place 0.4 mg under the tongue every 5 (five) minutes as needed.      Historical Provider, Danny [name]  pantoprazole (PROTONIX) 40 MG tablet Take 1 tablet (40 mg total) by mouth daily. 12/20/14   [name], Danny [name]  simvastatin (ZOCOR) 40 MG tablet TAKE ONE TABLET BY MOUTH AT BEDTIME 06/20/15   [name], Danny [name]  traMADol (ULTRAM) 50 MG tablet Take 1 tablet (50 mg total) by mouth every 6 (six) hours as needed. 03/27/16   [name], PA-C     Allergies Review of patient's allergies  indicates no known allergies.   Family History  Problem Relation Age of Onset  . Heart attack Father     complications    Social History Social History  Substance Use Topics  . Smoking status: Former Smoker    Packs/day: 3.00    Years: 0.00    Types: Cigarettes    Quit date: 09/07/2009  . Smokeless tobacco: Never Used     Comment: quit june 2011  . Alcohol use Yes    Review of Systems  Constitutional:   No fever or chills.  ENT:   No sore throat. No rhinorrhea. Cardiovascular:   Positive as above chest pain. Respiratory:   No dyspnea or cough. Gastrointestinal:   Negative for abdominal pain, vomiting and diarrhea.  Genitourinary:   Positive polyuria 10-point ROS otherwise negative.  ____________________________________________   PHYSICAL EXAM:  VITAL SIGNS: ED Triage Vitals  Enc Vitals Group     BP 05/01/16 1341 (!) 137/96     Pulse Rate 05/01/16 1341 (!) 120     Resp 05/01/16 1341 (!) 27     Temp --      Temp src --      SpO2 05/01/16 1338 95 %     Weight 05/01/16 1342 257 lb (116.6 kg)     Height 05/01/16 1342 6' (1.829 m)     Head Circumference --      Peak Flow --      Pain Score 05/01/16 1342 2     Pain Loc --      Pain Edu? --      Excl. in GC? --     Vital signs reviewed, nursing assessments reviewed.   Constitutional:   Alert and oriented. Well appearing and in no distress. Eyes:   No scleral icterus. No conjunctival pallor. PERRL. EOMI.  No nystagmus. ENT   Head:   Normocephalic and atraumatic.   Nose:   No congestion/rhinnorhea. No septal hematoma   Mouth/Throat:   MMM, no pharyngeal erythema. No peritonsillar mass.    Neck:   No stridor. No SubQ emphysema. No meningismus. Hematological/Lymphatic/Immunilogical:   No cervical lymphadenopathy. Cardiovascular:   RRR. Symmetric bilateral radial and DP pulses.  No murmurs.  Respiratory:   Normal respiratory effort without tachypnea nor retractions. Breath sounds are clear and equal  bilaterally. No wheezes/rales/rhonchi.Chest wall tender left of the sternum, reproduces the soreness. Gastrointestinal:   Soft and nontender. Non distended. There is no CVA tenderness.  No rebound, rigidity, or guarding. Genitourinary:   deferred Musculoskeletal:   Nontender with normal range of motion in all extremities. No joint effusions.  No lower extremity tenderness.  No edema. Neurologic:   Normal speech and language.  CN 2-10 normal. Motor grossly intact. No gross focal neurologic deficits are appreciated.  Skin:    Skin is  warm, dry and intact. No rash noted.  No petechiae, purpura, or bullae.  ____________________________________________    LABS (pertinent positives/negatives) (all labs ordered are listed, but only abnormal results are displayed) Labs Reviewed  COMPREHENSIVE METABOLIC PANEL - Abnormal; Notable for the following:       Result Value   Sodium 133 (*)    Glucose, Bld 443 (*)    BUN 35 (*)    Creatinine, Ser 1.48 (*)    GFR calc non Af Amer 48 (*)    GFR calc Af Amer 56 (*)    All other components within normal limits  TROPONIN I  TROPONIN I  TROPONIN I  URINALYSIS COMPLETEWITH MICROSCOPIC ([facility] ONLY)   ____________________________________________   EKG  Interpreted by me Sinus tachycardia rate 119, normal axis intervals QRS ST segments and T waves.  ____________________________________________    RADIOLOGY    ____________________________________________   PROCEDURES Procedures  ____________________________________________   INITIAL IMPRESSION / ASSESSMENT AND PLAN / ED COURSE  Pertinent labs & imaging results that were available during my care of the patient were reviewed by me and considered in my medical decision making (see chart for details).  Failure well-appearing no acute distress. Presents with atypical central chest pain and tachycardia. We'll give GI cocktail, check troponin 2, IV fluids. Continue to monitor and  reassess.    ----------------------------------------- 3:22 PM on 05/01/2016 -----------------------------------------  Chest pain resolved about an hour ago and patient has remained symptom-free. Troponin negative. Waiting for repeat troponin to be drawn at 4 PM. Case of be signed out to Dr. Sharma CovertNorman to follow-up. Patient is suitable to follow-up with his cardiologist Dr. Mariah MillingGollan if repeat labs are unremarkable.   Clinical Course   ____________________________________________   FINAL CLINICAL IMPRESSION(S) / ED DIAGNOSES  Final diagnoses:  Atypical chest pain       Portions of this note were generated with dragon dictation software. Dictation errors may occur despite best attempts at proofreading.    Danny CheekPhillip Sharica Roedel, Danny [name] 05/01/16 [phone]

## 2016-05-01 NOTE — Discharge Instructions (Signed)
Please make a follow-up appointment with your heart doctor. Please return to the emergency department if you develop chest pain, shortness of breath, lightheadedness or fainting, or any other symptoms concerning to you.

## 2016-05-01 NOTE — ED Triage Notes (Signed)
Pt [place] ED [facility]. Pt was sitting on a bench drinking a soda when CP began. States it feels like burning. Hx of MI and diabetes. CBG 422. Pt is noncompliant with checking CBG, states he takes insulin and pills but has not taken them today. Pt gave himself 2 nitro with relief, EMS gave 324 Asa. Pt HR with EMS 120's and 95% on RA.

## 2016-06-18 ENCOUNTER — Encounter: Payer: Self-pay | Admitting: Cardiovascular Disease

## 2016-06-18 ENCOUNTER — Ambulatory Visit (INDEPENDENT_AMBULATORY_CARE_PROVIDER_SITE_OTHER): Payer: Medicare Other | Admitting: Cardiovascular Disease

## 2016-06-18 VITALS — BP 180/116 | HR 90 | Ht 72.0 in | Wt 259.5 lb

## 2016-06-18 DIAGNOSIS — I10 Essential (primary) hypertension: Secondary | ICD-10-CM

## 2016-06-18 DIAGNOSIS — E66811 Obesity, class 1: Secondary | ICD-10-CM | POA: Insufficient documentation

## 2016-06-18 DIAGNOSIS — Z9114 Patient's other noncompliance with medication regimen: Secondary | ICD-10-CM

## 2016-06-18 DIAGNOSIS — Z794 Long term (current) use of insulin: Secondary | ICD-10-CM

## 2016-06-18 DIAGNOSIS — I2510 Atherosclerotic heart disease of native coronary artery without angina pectoris: Secondary | ICD-10-CM | POA: Diagnosis not present

## 2016-06-18 DIAGNOSIS — E118 Type 2 diabetes mellitus with unspecified complications: Secondary | ICD-10-CM | POA: Diagnosis not present

## 2016-06-18 DIAGNOSIS — E7800 Pure hypercholesterolemia, unspecified: Secondary | ICD-10-CM | POA: Diagnosis not present

## 2016-06-18 DIAGNOSIS — I200 Unstable angina: Secondary | ICD-10-CM

## 2016-06-18 DIAGNOSIS — J432 Centrilobular emphysema: Secondary | ICD-10-CM

## 2016-06-18 DIAGNOSIS — IMO0002 Reserved for concepts with insufficient information to code with codable children: Secondary | ICD-10-CM

## 2016-06-18 DIAGNOSIS — E1165 Type 2 diabetes mellitus with hyperglycemia: Secondary | ICD-10-CM

## 2016-06-18 MED ORDER — CARVEDILOL 25 MG PO TABS: 25.0000 mg | ORAL_TABLET | Freq: Two times a day (BID) | ORAL | 11 refills | Status: DC

## 2016-06-18 MED ORDER — SIMVASTATIN 40 MG PO TABS: ORAL_TABLET | ORAL | 11 refills | Status: DC

## 2016-06-18 MED ORDER — CLOPIDOGREL BISULFATE 75 MG PO TABS: 75.0000 mg | ORAL_TABLET | Freq: Every day | ORAL | 11 refills | Status: DC

## 2016-06-18 MED ORDER — ISOSORBIDE MONONITRATE ER 30 MG PO TB24: 30.0000 mg | ORAL_TABLET | Freq: Two times a day (BID) | ORAL | 11 refills | Status: DC

## 2016-06-18 NOTE — Patient Instructions (Signed)
Medication Instructions:   For blood pressure Take coreg/carvedilol twice a day Take isosorbide twice a day  Take simvastatin once a day for cholesterol  Labwork:  No new labs needed  Testing/Procedures:  No further testing at this time   I recommend watching educational videos on topics of interest to you at:       www.goemmi.com  Enter code: HEARTCARE    Follow-Up: It was a pleasure seeing you in the office today. Please call us if you have new issues that need to be addressed before your next appt.  [phone]  Your physician wants you to follow-up in: 6 months.  You will receive a reminder letter in the mail two months in advance. If you don't receive a letter, please call our office to schedule the follow-up appointment.  If you need a refill on your cardiac medications before your next appointment, please call your pharmacy.

## 2016-06-18 NOTE — Progress Notes (Signed)
Cardiology Office Note  Date:  06/18/2016   ID:  Danny Friendsobert M Castrejon Sr., DOB 08/25/1950, MRN [id number]  PCP:  Evelene CroonNIEMEYER, MEINDERT, MD   Chief Complaint  Patient presents with  . other    6 mo f/u. SOBOE.      HPI:  65 year old gentleman with a long smoking history, history of coronary artery disease, cardiac catheterization showing severe two-vessel disease, severe stenosis of the proximal to mid LAD, severe ostial O1 and O2 disease, moderate to severe mid left circumflex disease that appeared aneurysmal, moderate OM 2 disease ejection fraction 40% who had a PCI of the LAD on January 11, 2010, who used to smoke 4 packs a day who stopped earlier in 2011, history of diabetes, hyperlipidemia who presents for routine followup of his coronary artery disease. Chronic renal insufficiency, poorly controlled diabetes on insulin, most recent lab work not available  In follow-up today he was seen in the emergency room 05/01/2016 for chest pain. Developed after he was eating a biscuit Hospital records reviewed He was not taking his insulin, drinking Coca-Cola when he had symptoms Took several nitroglycerin, aspirin by EMS. Still had chest discomfort in the ER Cardiac enzymes negative. Symptoms possibly GI related Reports he was not wearing oxygen  He did not take pills today, BP elevated, Reports he took his blood pressure pills yesterday Only taking coreg once a day and isosorbide once a day Had a buscuit this morning "supposed to wear oxygen" Reports having some back discomfort, seems to come and go  Chronic mild shortness of breath which is stable, denies  chest pain Denies any significant lower extremity edema, no PND or orthopnea He sees Dr. Carron BrazenNiemeyer's office every 3 months  Somewhat unsure about his medications, thinks he is taking them all Weight is up, eating poorly, no exercise, poor diet in general.   EKG on today's visit shows normal sinus rhythm with rate 90 bpm, nonspecific ST and T  wave abnormality anterolateral leads, APCs noted  Other past medical history Prior to his last clinic visit, he had 2 bags of potato chips. He woke up later overnight with significant chest discomfort/upper epigastric pain. He took nitroglycerin which helped his symptoms for 30 minutes but pain returned. He took several nitroglycerin through the night. Symptoms better the next day, Sunday and feels well today. He has had GI issues before with symptoms of GERD. He reports that PPI was stopped by primary care some time ago. Prior episodes of syncope, was sitting at the time, witnessed by a friend. Resolved quickly with no postictal changes. No symptoms since that time several years ago  He was seen in the hospital 11/23/2014 with elevated glucose of more than 300, also with creatinine of 2, BUN 40. He was given saline IV and discharged home.  lab work from January 2016 shows creatinine 1.67, BUN 33, glucose 520, hemoglobin A1c 14, total cholesterol 168, LDL 71  PMH:   has a past medical history of CAD (coronary artery disease); COPD (chronic obstructive pulmonary disease) (HCC); DM2 (diabetes mellitus, type 2) (HCC); HLD (hyperlipidemia); HTN (hypertension); and Non-Q wave myocardial infarction (HCC).  PSH:    Past Surgical History:  Procedure Laterality Date  . CAD: stent to the LAD    . CARDIAC CATHETERIZATION  10/01/2014  . CORONARY ANGIOPLASTY WITH STENT PLACEMENT  10/01/2014    Current Outpatient Prescriptions  Medication Sig Dispense Refill  . albuterol (PROVENTIL) (2.5 MG/3ML) 0.083% nebulizer solution Take 2.5 mg by nebulization every 6 (six) hours  as needed for wheezing or shortness of breath.    [name] aspirin 81 MG tablet Take 81 mg by mouth daily.      . carvedilol (COREG) 25 MG tablet Take 1 tablet (25 mg total) by mouth 2 (two) times daily with a meal. 60 tablet 11  . cetirizine (ZYRTEC) 10 MG tablet Take 10 mg by mouth daily.    . clopidogrel (PLAVIX) 75 MG tablet Take 1 tablet  (75 mg total) by mouth daily. 30 tablet 11  . cyclobenzaprine (FLEXERIL) 5 MG tablet Take 1 tablet (5 mg total) by mouth 3 (three) times daily as needed for muscle spasms. 15 tablet 0  . empagliflozin (JARDIANCE) 25 MG TABS tablet Take 25 mg by mouth daily.    . insulin glargine (LANTUS) 100 UNIT/ML injection Inject 80 Units into the skin 2 (two) times daily.     . isosorbide mononitrate (IMDUR) 30 MG 24 hr tablet Take 1 tablet (30 mg total) by mouth 2 (two) times daily. 60 tablet 11  . linagliptin (TRADJENTA) 5 MG TABS tablet Take 5 mg by mouth daily.    . methocarbamol (ROBAXIN) 500 MG tablet Take 1 tablet (500 mg total) by mouth 2 (two) times daily. 20 tablet 0  . naproxen (NAPROSYN) 500 MG tablet Take 1 tablet (500 mg total) by mouth 2 (two) times daily with a meal. 30 tablet 0  . nitroGLYCERIN (NITROSTAT) 0.4 MG SL tablet Place 0.4 mg under the tongue every 5 (five) minutes as needed.      . pantoprazole (PROTONIX) 40 MG tablet Take 1 tablet (40 mg total) by mouth daily. 90 tablet 3  . simvastatin (ZOCOR) 40 MG tablet TAKE ONE TABLET BY MOUTH AT BEDTIME 30 tablet 11  . traMADol (ULTRAM) 50 MG tablet Take 1 tablet (50 mg total) by mouth every 6 (six) hours as needed. 20 tablet 0   No current facility-administered medications for this visit.      Allergies:   Patient has no known allergies.   Social History:  The patient  reports that he quit smoking about 6 years ago. His smoking use included Cigarettes. He smoked 3.00 packs per day for 0.00 years. He has never used smokeless tobacco. He reports that he drinks alcohol. He reports that he does not use drugs.   Family History:   family history includes Heart attack in his father.    Review of Systems: Review of Systems  Constitutional: Negative.   Respiratory: Negative.   Cardiovascular: Negative.   Gastrointestinal: Negative.   Musculoskeletal: Negative.   Neurological: Negative.   Psychiatric/Behavioral: Negative.   All other  systems reviewed and are negative.    PHYSICAL EXAM: VS:  BP (!) 180/116 (BP Location: Left Arm, Patient Position: Sitting, Cuff Size: Normal)   Pulse 90   Ht 6' (1.829 m)   Wt 259 lb 8 oz (117.7 kg)   BMI 35.19 kg/m  , BMI Body mass index is 35.19 kg/m. GEN: Well nourished, well developed, in no acute distress , obese HEENT: normal  Neck: no JVD, carotid bruits, or masses Cardiac: RRR; no murmurs, rubs, or gallops,no edema  Respiratory:  clear to auscultation bilaterally, normal work of breathing GI: soft, nontender, nondistended, + BS MS: no deformity or atrophy  Skin: warm and dry, no rash Neuro:  Strength and sensation are intact Psych: euthymic mood, full affect    Recent Labs: 05/01/2016: ALT 26; BUN 35; Creatinine, Ser 1.48; Potassium 4.4; Sodium 133    Lipid Panel  No results found for: CHOL, HDL, LDLCALC, TRIG    Wt Readings from Last 3 Encounters:  06/18/16 259 lb 8 oz (117.7 kg)  05/01/16 257 lb (116.6 kg)  03/27/16 243 lb (110.2 kg)       ASSESSMENT AND PLAN:  Coronary artery disease involving native coronary artery of native heart without angina pectoris Currently with no symptoms of angina. No further workup at this time. Continue current medication regimen. Stressed the importance of medication compliance  HYPERTENSION, BENIGN Markedly elevated blood pressure Did not take medications today, Has only been taking one carvedilol, 1 isosorbide. His post to take these twice a day. Recommended he take these twice a day and take them immediately when he gets home today Also recommended he try to find a blood pressure cuff for close monitoring Suggested he call our office with blood pressure numbers  Pure hypercholesterolemia Stressed importance of staying on his simvastatin. Goal LDL less than 70 Managed by primary care  Uncontrolled type 2 diabetes mellitus with complication, with long-term current use of insulin (HCC) Point controlled diabetes by  history. Managed by primary care Noncompliant with his diet and medications/insulin Sugar today checked, was 300  Centrilobular emphysema (HCC) Stop smoking 2011 Reported that he tried one cigarette last month Was told that he needed oxygen but does not have this yet Chronic mild shortness of breath, stable  Morbid obesity (HCC) We have encouraged continued exercise, careful diet management in an effort to lose weight. Poor diet  Medication noncompliance Stressed importance of taking his medications He was only taking 1 carvedilol, 1 isosorbide daily, not taking his insulin We spent a long time with him today discussing his medication regiment and the need for compliance  Disposition:   F/U  6 months   Total encounter time more than 25 minutes  Greater than 50% was spent in counseling and coordination of care with the patient     Signed, Dossie Arbourim Shelene Krage, M.D., Ph.D. 06/18/2016  [facility], [place] [phone]

## 2016-06-26 NOTE — Addendum Note (Signed)
Addended by: LOPEZ, MARINA C on: 06/26/2016 07:37 AM   Modules accepted: Orders  

## 2016-07-20 ENCOUNTER — Inpatient Hospital Stay
Admission: EM | Admit: 2016-07-20 | Discharge: 2016-07-23 | DRG: 281 | Disposition: A | Discharge status: Home / Self Care | Payer: Medicare Other | Attending: Internal Medicine | Admitting: Internal Medicine

## 2016-07-20 ENCOUNTER — Emergency Department: Payer: Medicare Other

## 2016-07-20 DIAGNOSIS — I129 Hypertensive chronic kidney disease with stage 1 through stage 4 chronic kidney disease, or unspecified chronic kidney disease: Secondary | ICD-10-CM | POA: Diagnosis present

## 2016-07-20 DIAGNOSIS — N183 Chronic kidney disease, stage 3 (moderate): Secondary | ICD-10-CM | POA: Diagnosis present

## 2016-07-20 DIAGNOSIS — J449 Chronic obstructive pulmonary disease, unspecified: Secondary | ICD-10-CM | POA: Diagnosis present

## 2016-07-20 DIAGNOSIS — Z794 Long term (current) use of insulin: Secondary | ICD-10-CM

## 2016-07-20 DIAGNOSIS — R079 Chest pain, unspecified: Secondary | ICD-10-CM

## 2016-07-20 DIAGNOSIS — E785 Hyperlipidemia, unspecified: Secondary | ICD-10-CM | POA: Diagnosis present

## 2016-07-20 DIAGNOSIS — I2510 Atherosclerotic heart disease of native coronary artery without angina pectoris: Secondary | ICD-10-CM | POA: Diagnosis present

## 2016-07-20 DIAGNOSIS — R739 Hyperglycemia, unspecified: Secondary | ICD-10-CM

## 2016-07-20 DIAGNOSIS — I214 Non-ST elevation (NSTEMI) myocardial infarction: Secondary | ICD-10-CM | POA: Diagnosis not present

## 2016-07-20 DIAGNOSIS — E1122 Type 2 diabetes mellitus with diabetic chronic kidney disease: Secondary | ICD-10-CM | POA: Diagnosis present

## 2016-07-20 DIAGNOSIS — J4489 Other specified chronic obstructive pulmonary disease: Secondary | ICD-10-CM | POA: Diagnosis present

## 2016-07-20 DIAGNOSIS — I10 Essential (primary) hypertension: Secondary | ICD-10-CM | POA: Diagnosis present

## 2016-07-20 DIAGNOSIS — Z8249 Family history of ischemic heart disease and other diseases of the circulatory system: Secondary | ICD-10-CM

## 2016-07-20 DIAGNOSIS — Z955 Presence of coronary angioplasty implant and graft: Secondary | ICD-10-CM

## 2016-07-20 DIAGNOSIS — I255 Ischemic cardiomyopathy: Secondary | ICD-10-CM | POA: Diagnosis present

## 2016-07-20 DIAGNOSIS — Z87891 Personal history of nicotine dependence: Secondary | ICD-10-CM

## 2016-07-20 DIAGNOSIS — E1165 Type 2 diabetes mellitus with hyperglycemia: Secondary | ICD-10-CM | POA: Diagnosis present

## 2016-07-20 DIAGNOSIS — N179 Acute kidney failure, unspecified: Secondary | ICD-10-CM | POA: Diagnosis present

## 2016-07-20 DIAGNOSIS — G4700 Insomnia, unspecified: Secondary | ICD-10-CM | POA: Diagnosis present

## 2016-07-20 DIAGNOSIS — E1169 Type 2 diabetes mellitus with other specified complication: Secondary | ICD-10-CM | POA: Diagnosis present

## 2016-07-20 DIAGNOSIS — IMO0002 Reserved for concepts with insufficient information to code with codable children: Secondary | ICD-10-CM

## 2016-07-20 DIAGNOSIS — K219 Gastro-esophageal reflux disease without esophagitis: Secondary | ICD-10-CM | POA: Diagnosis present

## 2016-07-20 DIAGNOSIS — E118 Type 2 diabetes mellitus with unspecified complications: Secondary | ICD-10-CM

## 2016-07-20 DIAGNOSIS — Z79899 Other long term (current) drug therapy: Secondary | ICD-10-CM

## 2016-07-20 DIAGNOSIS — Z7984 Long term (current) use of oral hypoglycemic drugs: Secondary | ICD-10-CM

## 2016-07-20 DIAGNOSIS — I252 Old myocardial infarction: Secondary | ICD-10-CM

## 2016-07-20 DIAGNOSIS — Z7902 Long term (current) use of antithrombotics/antiplatelets: Secondary | ICD-10-CM

## 2016-07-20 DIAGNOSIS — Z7982 Long term (current) use of aspirin: Secondary | ICD-10-CM

## 2016-07-20 DIAGNOSIS — I25110 Atherosclerotic heart disease of native coronary artery with unstable angina pectoris: Secondary | ICD-10-CM | POA: Diagnosis present

## 2016-07-20 LAB — CBC
HCT: 45.9 % (ref 40.0–52.0)
Hemoglobin: 15.1 g/dL (ref 13.0–18.0)
MCH: 30.4 pg (ref 26.0–34.0)
MCHC: 33 g/dL (ref 32.0–36.0)
MCV: 92.3 fL (ref 80.0–100.0)
Platelets: 230 10*3/uL (ref 150–440)
RBC: 4.98 MIL/uL (ref 4.40–5.90)
RDW: 13.1 % (ref 11.5–14.5)
WBC: 6.2 10*3/uL (ref 3.8–10.6)

## 2016-07-20 LAB — COMPREHENSIVE METABOLIC PANEL
ALT: 21 U/L (ref 17–63)
AST: 23 U/L (ref 15–41)
Albumin: 3.9 g/dL (ref 3.5–5.0)
Alkaline Phosphatase: 95 U/L (ref 38–126)
Anion gap: 8 (ref 5–15)
BUN: 40 mg/dL — ABNORMAL HIGH (ref 6–20)
CO2: 22 mmol/L (ref 22–32)
Calcium: 8.9 mg/dL (ref 8.9–10.3)
Chloride: 103 mmol/L (ref 101–111)
Creatinine, Ser: 1.54 mg/dL — ABNORMAL HIGH (ref 0.61–1.24)
GFR calc Af Amer: 53 mL/min — ABNORMAL LOW (ref >60)
GFR calc non Af Amer: 46 mL/min — ABNORMAL LOW (ref >60)
Glucose, Bld: 595 mg/dL (ref 65–99)
Potassium: 5.1 mmol/L (ref 3.5–5.1)
Sodium: 133 mmol/L — ABNORMAL LOW (ref 135–145)
Total Bilirubin: 0.4 mg/dL (ref 0.3–1.2)
Total Protein: 7 g/dL (ref 6.5–8.1)

## 2016-07-20 LAB — TROPONIN I: Troponin I: 0.03 ng/mL (ref <0.03)

## 2016-07-20 LAB — LIPASE, BLOOD: Lipase: 52 U/L — ABNORMAL HIGH (ref 11–51)

## 2016-07-20 MED ORDER — KETOROLAC TROMETHAMINE 30 MG/ML IJ SOLN
30.0000 mg | Freq: Once | INTRAMUSCULAR | Status: AC
  Administered 2016-07-20: 30 mg via INTRAVENOUS
  Filled 2016-07-20: qty 1

## 2016-07-20 MED ORDER — IPRATROPIUM-ALBUTEROL 0.5-2.5 (3) MG/3ML IN SOLN
RESPIRATORY_TRACT | Status: AC
  Administered 2016-07-20: 3 mL via RESPIRATORY_TRACT
  Filled 2016-07-20: qty 3

## 2016-07-20 MED ORDER — IPRATROPIUM-ALBUTEROL 0.5-2.5 (3) MG/3ML IN SOLN
3.0000 mL | Freq: Once | RESPIRATORY_TRACT | Status: AC
  Administered 2016-07-20: 3 mL via RESPIRATORY_TRACT

## 2016-07-20 MED ORDER — SODIUM CHLORIDE 0.9 % IV BOLUS (SEPSIS)
1000.0000 mL | Freq: Once | INTRAVENOUS | Status: AC
  Administered 2016-07-20: 1000 mL via INTRAVENOUS

## 2016-07-20 MED ORDER — SODIUM CHLORIDE 0.9 % IV BOLUS (SEPSIS)
1000.0000 mL | Freq: Once | INTRAVENOUS | Status: AC
Start: 2016-07-20 — End: 2016-07-20
  Administered 2016-07-20: 1000 mL via INTRAVENOUS

## 2016-07-20 NOTE — ED Notes (Signed)
Patient transported to X-ray 

## 2016-07-20 NOTE — ED Triage Notes (Signed)
Pt to triage via WC, reports CP x 2 hours, radiating down left arm, took 4 nitro with limited relief, report hx MI x 2 in past, states feels similar.

## 2016-07-20 NOTE — ED Provider Notes (Signed)
[facility] Emergency Department Provider Note   First MD Initiated Contact with Patient 07/20/16 2307     (approximate)  I have reviewed the triage vital signs and the nursing notes.   HISTORY  Chief Complaint Chest Pain    HPI Danny ScrapeRobert M Lorenzo Sr. is a 65 y.o. male with bolus of chronic medical conditions including myocardial infarctions 27 years ago. On my arrival to the room the patient states "can I  get a pain pill because him passing a kidney stone". Patient states however that he presents to the emergency department secondary to central chest pain that lasted approximately 2 hours which is completely resolved at this time. Patient states that he took an total 4 sublingual nitroglycerin at home. Patient admits to dyspnea however no lower extremity pain or swelling. Patient denies any cough no fever. Patient denies any nausea or vomiting. She denies any palpitations   Past Medical History:  Diagnosis Date  . CAD (coronary artery disease)   . COPD (chronic obstructive pulmonary disease) (HCC)   . DM2 (diabetes mellitus, type 2) (HCC)   . HLD (hyperlipidemia)   . HTN (hypertension)   . Non-Q wave myocardial infarction [facility](HCC)    mild    Patient Active Problem List   Diagnosis Date Noted  . Morbid obesity (HCC) 06/18/2016  . H/O medication noncompliance 06/18/2016  . COPD (chronic obstructive pulmonary disease) (HCC) 06/20/2015  . GERD (gastroesophageal reflux disease) 12/20/2014  . Syncope 12/13/2014  . Unstable angina (HCC) 09/22/2014  . Renal insufficiency 02/23/2013  . Angina pectoris (HCC) 10/17/2011  . Diabetes mellitus type 2, uncontrolled, with complications (HCC) 06/05/2010  . Hyperlipidemia 02/10/2010  . HYPERTENSION, BENIGN 02/10/2010  . CAD (coronary artery disease) 02/10/2010    Past Surgical History:  Procedure Laterality Date  . CAD: stent to the LAD    . CARDIAC CATHETERIZATION  10/01/2014  . CORONARY ANGIOPLASTY WITH STENT  PLACEMENT  10/01/2014    Prior to Admission medications   Medication Sig Start Date End Date Taking? Authorizing Provider  albuterol (PROVENTIL) (2.5 MG/3ML) 0.083% nebulizer solution Take 2.5 mg by nebulization every 6 (six) hours as needed for wheezing or shortness of breath.    Historical Provider, MD  aspirin 81 MG tablet Take 81 mg by mouth daily.      Historical Provider, MD  carvedilol (COREG) 25 MG tablet Take 1 tablet (25 mg total) by mouth 2 (two) times daily with a meal. 06/18/16   Antonieta Ibaimothy J Gollan, MD  cetirizine (ZYRTEC) 10 MG tablet Take 10 mg by mouth daily.    Historical Provider, MD  clopidogrel (PLAVIX) 75 MG tablet Take 1 tablet (75 mg total) by mouth daily. 06/18/16   Antonieta Ibaimothy J Gollan, MD  cyclobenzaprine (FLEXERIL) 5 MG tablet Take 1 tablet (5 mg total) by mouth 3 (three) times daily as needed for muscle spasms. 03/27/16   [name], PA-C  empagliflozin (JARDIANCE) 25 MG TABS tablet Take 25 mg by mouth daily.    Historical Provider, MD  insulin glargine (LANTUS) 100 UNIT/ML injection Inject 80 Units into the skin 2 (two) times daily.     Historical Provider, MD  isosorbide mononitrate (IMDUR) 30 MG 24 hr tablet Take 1 tablet (30 mg total) by mouth 2 (two) times daily. 06/18/16   Antonieta Ibaimothy J Gollan, MD  linagliptin (TRADJENTA) 5 MG TABS tablet Take 5 mg by mouth daily.    Historical Provider, MD  methocarbamol (ROBAXIN) 500 MG tablet Take 1 tablet (  500 mg total) by mouth 2 (two) times daily. 10/06/15   Garlon Hatchet, PA-C  naproxen (NAPROSYN) 500 MG tablet Take 1 tablet (500 mg total) by mouth 2 (two) times daily with a meal. 10/06/15   Garlon Hatchet, PA-C  nitroGLYCERIN (NITROSTAT) 0.4 MG SL tablet Place 0.4 mg under the tongue every 5 (five) minutes as needed.      Historical Provider, MD  pantoprazole (PROTONIX) 40 MG tablet Take 1 tablet (40 mg total) by mouth daily. 12/20/14   [name], MD  simvastatin (ZOCOR) 40 MG tablet TAKE ONE TABLET BY MOUTH AT BEDTIME  06/18/16   [name], MD  traMADol (ULTRAM) 50 MG tablet Take 1 tablet (50 mg total) by mouth every 6 (six) hours as needed. 03/27/16   Charlesetta Ivory Menshew, PA-C    Allergies Patient has no known allergies.  Family History  Problem Relation Age of Onset  . Heart attack Father     complications    Social History Social History  Substance Use Topics  . Smoking status: Former Smoker    Packs/day: 3.00    Years: 0.00    Types: Cigarettes    Quit date: 09/07/2009  . Smokeless tobacco: Never Used     Comment: quit june 2011  . Alcohol use Yes    Review of Systems Constitutional: No fever/chills Eyes: No visual changes. ENT: No sore throat. Cardiovascular: Positive for chest pain Respiratory: Denies shortness of breath. Gastrointestinal: No abdominal pain.  No nausea, no vomiting.  No diarrhea.  No constipation. Genitourinary: Negative for dysuria. Musculoskeletal: Negative for back pain. Skin: Negative for rash. Neurological: Negative for headaches, focal weakness or numbness.  10-point ROS otherwise negative.  ____________________________________________   PHYSICAL EXAM:  VITAL SIGNS: ED Triage Vitals  Enc Vitals Group     BP 07/20/16 2215 (!) 173/104     Pulse Rate 07/20/16 2215 95     Resp 07/20/16 2215 (!) 22     Temp 07/20/16 2215 97.8 F (36.6 C)     Temp src --      SpO2 07/20/16 2215 95 %     Weight --      Height --      Head Circumference --      Peak Flow --      Pain Score 07/20/16 2211 8     Pain Loc --      Pain Edu? --      Excl. in GC? --     Constitutional: Alert and oriented. Well appearing and in no acute distress. Eyes: Conjunctivae are normal. PERRL. EOMI. Head: Atraumatic. Ears:  Healthy appearing ear canals and TMs bilaterally Nose: No congestion/rhinnorhea. Mouth/Throat: Mucous membranes are moist.  Oropharynx non-erythematous. Neck: No stridor.  No meningeal signs. No cervical spine tenderness to  palpation. Cardiovascular: Normal rate, regular rhythm. Good peripheral circulation. Grossly normal heart sounds. Respiratory: Normal respiratory effort.  No retractions. Lungs CTAB. Gastrointestinal: Soft and nontender. No distention.  Musculoskeletal: No lower extremity tenderness nor edema. No gross deformities of extremities. Neurologic:  Normal speech and language. No gross focal neurologic deficits are appreciated.  Skin:  Skin is warm, dry and intact. No rash noted. Psychiatric: Mood and affect are normal. Speech and behavior are normal.  ____________________________________________   LABS (all labs ordered are listed, but only abnormal results are displayed)  Labs Reviewed  CBC  COMPREHENSIVE METABOLIC PANEL  LIPASE, BLOOD  TROPONIN I   ____________________________________________  EKG  ED  ECG REPORT I, [place] N BROWN, the attending physician, personally viewed and interpreted this ECG.   Date: 07/20/2016  EKG Time: 10:14 PM  Rate: 95  Rhythm: Normal sinus rhythm  Axis: Normal  Intervals: Normal  ST&T Change: None  ____________________________________________  RADIOLOGY I, [place] N BROWN, personally viewed and evaluated these images (plain radiographs) as part of my medical decision making, as well as reviewing the written report by the radiologist.  Dg Chest 2 View  Result Date: 07/20/2016 CLINICAL DATA:  Initial evaluation for acute chest pain. EXAM: CHEST  2 VIEW COMPARISON:  Prior radiograph from 05/15/2015. FINDINGS: Cardiac and mediastinal silhouettes are stable in size and contour, and remain within normal limits. Lungs are mildly hyperinflated with changes compatible with emphysema. Right perihilar scarring. No focal infiltrates. No pulmonary edema or pleural effusion. No pneumothorax. No acute osseous abnormality. IMPRESSION: 1. No active cardiopulmonary disease. 2. Emphysema with right perihilar scarring, similar to previous. Electronically Signed    By: Rise MuBenjamin  McClintock M.D.   On: 07/20/2016 23:01      Procedures   INITIAL IMPRESSION / ASSESSMENT AND PLAN / ED COURSE  Pertinent labs & imaging results that were available during my care of the patient were reviewed by me and considered in my medical decision making (see chart for details).  Given history physical exam concern for possible cardiac etiology for the patient's chest pain initial troponin 0.03. In addition patient noted to be markedly hyperglycemic as such 2 L IV normal saline given Patient discussed with Dr. Anne HahnWillis for hospital admission for further evaluation and management.   Clinical Course     ____________________________________________  FINAL CLINICAL IMPRESSION(S) / ED DIAGNOSES  Final diagnoses:  Hyperglycemia  Chest pain, unspecified type     MEDICATIONS GIVEN DURING THIS VISIT:  Medications - No data to display   NEW OUTPATIENT MEDICATIONS STARTED DURING THIS VISIT:  New Prescriptions   No medications on file    Modified Medications   No medications on file    Discontinued Medications   No medications on file     Note:  This document was prepared using Dragon voice recognition software and may include unintentional dictation errors.    Darci Currentandolph N Brown, MD 07/21/16 [phone]

## 2016-07-21 ENCOUNTER — Observation Stay (HOSPITAL_COMMUNITY)
Admit: 2016-07-21 | Discharge: 2016-07-21 | Disposition: A | Discharge status: Home / Self Care | Payer: Medicare Other | Attending: Internal Medicine | Admitting: Internal Medicine

## 2016-07-21 DIAGNOSIS — N289 Disorder of kidney and ureter, unspecified: Secondary | ICD-10-CM

## 2016-07-21 DIAGNOSIS — I252 Old myocardial infarction: Secondary | ICD-10-CM | POA: Diagnosis not present

## 2016-07-21 DIAGNOSIS — Z91148 Patient's other noncompliance with medication regimen for other reason: Secondary | ICD-10-CM

## 2016-07-21 DIAGNOSIS — I10 Essential (primary) hypertension: Secondary | ICD-10-CM

## 2016-07-21 DIAGNOSIS — Z7984 Long term (current) use of oral hypoglycemic drugs: Secondary | ICD-10-CM | POA: Diagnosis not present

## 2016-07-21 DIAGNOSIS — Z8249 Family history of ischemic heart disease and other diseases of the circulatory system: Secondary | ICD-10-CM | POA: Diagnosis not present

## 2016-07-21 DIAGNOSIS — E1122 Type 2 diabetes mellitus with diabetic chronic kidney disease: Secondary | ICD-10-CM | POA: Diagnosis present

## 2016-07-21 DIAGNOSIS — Z9114 Patient's other noncompliance with medication regimen: Secondary | ICD-10-CM

## 2016-07-21 DIAGNOSIS — Z7902 Long term (current) use of antithrombotics/antiplatelets: Secondary | ICD-10-CM | POA: Diagnosis not present

## 2016-07-21 DIAGNOSIS — G4700 Insomnia, unspecified: Secondary | ICD-10-CM | POA: Diagnosis present

## 2016-07-21 DIAGNOSIS — I255 Ischemic cardiomyopathy: Secondary | ICD-10-CM | POA: Diagnosis present

## 2016-07-21 DIAGNOSIS — I200 Unstable angina: Secondary | ICD-10-CM

## 2016-07-21 DIAGNOSIS — Z794 Long term (current) use of insulin: Secondary | ICD-10-CM | POA: Diagnosis not present

## 2016-07-21 DIAGNOSIS — R079 Chest pain, unspecified: Secondary | ICD-10-CM | POA: Diagnosis not present

## 2016-07-21 DIAGNOSIS — J449 Chronic obstructive pulmonary disease, unspecified: Secondary | ICD-10-CM | POA: Diagnosis present

## 2016-07-21 DIAGNOSIS — J432 Centrilobular emphysema: Secondary | ICD-10-CM

## 2016-07-21 DIAGNOSIS — I129 Hypertensive chronic kidney disease with stage 1 through stage 4 chronic kidney disease, or unspecified chronic kidney disease: Secondary | ICD-10-CM | POA: Diagnosis present

## 2016-07-21 DIAGNOSIS — N179 Acute kidney failure, unspecified: Secondary | ICD-10-CM

## 2016-07-21 DIAGNOSIS — E1165 Type 2 diabetes mellitus with hyperglycemia: Secondary | ICD-10-CM | POA: Diagnosis present

## 2016-07-21 DIAGNOSIS — E785 Hyperlipidemia, unspecified: Secondary | ICD-10-CM | POA: Diagnosis present

## 2016-07-21 DIAGNOSIS — K219 Gastro-esophageal reflux disease without esophagitis: Secondary | ICD-10-CM

## 2016-07-21 DIAGNOSIS — I25110 Atherosclerotic heart disease of native coronary artery with unstable angina pectoris: Secondary | ICD-10-CM | POA: Diagnosis present

## 2016-07-21 DIAGNOSIS — Z7982 Long term (current) use of aspirin: Secondary | ICD-10-CM | POA: Diagnosis not present

## 2016-07-21 DIAGNOSIS — Z87891 Personal history of nicotine dependence: Secondary | ICD-10-CM | POA: Diagnosis not present

## 2016-07-21 DIAGNOSIS — I209 Angina pectoris, unspecified: Secondary | ICD-10-CM

## 2016-07-21 DIAGNOSIS — N183 Chronic kidney disease, stage 3 (moderate): Secondary | ICD-10-CM | POA: Diagnosis present

## 2016-07-21 DIAGNOSIS — Z955 Presence of coronary angioplasty implant and graft: Secondary | ICD-10-CM | POA: Diagnosis not present

## 2016-07-21 DIAGNOSIS — I214 Non-ST elevation (NSTEMI) myocardial infarction: Secondary | ICD-10-CM | POA: Diagnosis present

## 2016-07-21 DIAGNOSIS — R55 Syncope and collapse: Secondary | ICD-10-CM

## 2016-07-21 DIAGNOSIS — I2510 Atherosclerotic heart disease of native coronary artery without angina pectoris: Secondary | ICD-10-CM

## 2016-07-21 DIAGNOSIS — E7800 Pure hypercholesterolemia, unspecified: Secondary | ICD-10-CM

## 2016-07-21 DIAGNOSIS — Z79899 Other long term (current) drug therapy: Secondary | ICD-10-CM | POA: Diagnosis not present

## 2016-07-21 LAB — BASIC METABOLIC PANEL
Anion gap: 3 — ABNORMAL LOW (ref 5–15)
BUN: 34 mg/dL — ABNORMAL HIGH (ref 6–20)
CO2: 25 mmol/L (ref 22–32)
Calcium: 8.3 mg/dL — ABNORMAL LOW (ref 8.9–10.3)
Chloride: 111 mmol/L (ref 101–111)
Creatinine, Ser: 1.28 mg/dL — ABNORMAL HIGH (ref 0.61–1.24)
GFR calc Af Amer: >60 mL/min (ref >60)
GFR calc non Af Amer: 57 mL/min — ABNORMAL LOW (ref >60)
Glucose, Bld: 122 mg/dL — ABNORMAL HIGH (ref 65–99)
Potassium: 4.5 mmol/L (ref 3.5–5.1)
Sodium: 139 mmol/L (ref 135–145)

## 2016-07-21 LAB — GLUCOSE, CAPILLARY
Glucose-Capillary: 287 mg/dL — ABNORMAL HIGH (ref 65–99)
Glucose-Capillary: 182 mg/dL — ABNORMAL HIGH (ref 65–99)
Glucose-Capillary: 80 mg/dL (ref 65–99)
Glucose-Capillary: 102 mg/dL — ABNORMAL HIGH (ref 65–99)

## 2016-07-21 LAB — APTT: aPTT: 133 seconds — ABNORMAL HIGH (ref 24–36)

## 2016-07-21 LAB — PROTIME-INR
INR: 1.14
Prothrombin Time: 14.7 seconds (ref 11.4–15.2)

## 2016-07-21 LAB — CBC
HCT: 41.5 % (ref 40.0–52.0)
Hemoglobin: 13.8 g/dL (ref 13.0–18.0)
MCH: 30.7 pg (ref 26.0–34.0)
MCHC: 33.1 g/dL (ref 32.0–36.0)
MCV: 92.7 fL (ref 80.0–100.0)
Platelets: 220 10*3/uL (ref 150–440)
RBC: 4.48 MIL/uL (ref 4.40–5.90)
RDW: 13.1 % (ref 11.5–14.5)
WBC: 7 10*3/uL (ref 3.8–10.6)

## 2016-07-21 LAB — TROPONIN I
Troponin I: 0.07 ng/mL (ref <0.03)
Troponin I: 0.12 ng/mL (ref <0.03)
Troponin I: 0.12 ng/mL (ref <0.03)
Troponin I: 0.09 ng/mL (ref <0.03)

## 2016-07-21 LAB — HEPARIN LEVEL (UNFRACTIONATED): Heparin Unfractionated: 0.27 IU/mL — ABNORMAL LOW (ref 0.30–0.70)

## 2016-07-21 MED ORDER — ISOSORBIDE MONONITRATE ER 30 MG PO TB24
30.0000 mg | ORAL_TABLET | Freq: Two times a day (BID) | ORAL | Status: DC
  Administered 2016-07-21 – 2016-07-23 (×6): 30 mg via ORAL
  Filled 2016-07-21 (×7): qty 1

## 2016-07-21 MED ORDER — TRAMADOL HCL 50 MG PO TABS
50.0000 mg | ORAL_TABLET | Freq: Four times a day (QID) | ORAL | Status: DC | PRN
  Administered 2016-07-21: 50 mg via ORAL
  Filled 2016-07-21: qty 1

## 2016-07-21 MED ORDER — ASPIRIN EC 81 MG PO TBEC
81.0000 mg | DELAYED_RELEASE_TABLET | Freq: Every day | ORAL | Status: DC
  Administered 2016-07-21 – 2016-07-22 (×2): 81 mg via ORAL
  Filled 2016-07-21 (×3): qty 1

## 2016-07-21 MED ORDER — CARVEDILOL 25 MG PO TABS
25.0000 mg | ORAL_TABLET | Freq: Two times a day (BID) | ORAL | Status: DC
  Administered 2016-07-21 – 2016-07-23 (×6): 25 mg via ORAL
  Filled 2016-07-21 (×6): qty 1

## 2016-07-21 MED ORDER — INSULIN ASPART 100 UNIT/ML ~~LOC~~ SOLN
0.0000 [IU] | Freq: Four times a day (QID) | SUBCUTANEOUS | Status: DC
  Administered 2016-07-21: 2 [IU] via SUBCUTANEOUS
  Administered 2016-07-21: 1 [IU] via SUBCUTANEOUS
  Administered 2016-07-22 (×2): 5 [IU] via SUBCUTANEOUS
  Administered 2016-07-22: 3 [IU] via SUBCUTANEOUS
  Filled 2016-07-21 (×2): qty 5
  Filled 2016-07-21: qty 3
  Filled 2016-07-21: qty 1
  Filled 2016-07-21: qty 2

## 2016-07-21 MED ORDER — CLOPIDOGREL BISULFATE 75 MG PO TABS
75.0000 mg | ORAL_TABLET | Freq: Every day | ORAL | Status: DC
  Administered 2016-07-21 – 2016-07-23 (×3): 75 mg via ORAL
  Filled 2016-07-21 (×3): qty 1

## 2016-07-21 MED ORDER — OXYCODONE HCL 5 MG PO TABS
5.0000 mg | ORAL_TABLET | ORAL | Status: DC | PRN
  Administered 2016-07-21 – 2016-07-23 (×2): 5 mg via ORAL
  Filled 2016-07-21 (×2): qty 1

## 2016-07-21 MED ORDER — CYCLOBENZAPRINE HCL 10 MG PO TABS
5.0000 mg | ORAL_TABLET | Freq: Three times a day (TID) | ORAL | Status: DC | PRN
  Administered 2016-07-22: 5 mg via ORAL
  Filled 2016-07-21: qty 1

## 2016-07-21 MED ORDER — PERFLUTREN LIPID MICROSPHERE
1.0000 mL | INTRAVENOUS | Status: AC | PRN
  Administered 2016-07-21: 3 mL via INTRAVENOUS
  Filled 2016-07-21: qty 10

## 2016-07-21 MED ORDER — ALUM & MAG HYDROXIDE-SIMETH 200-200-20 MG/5ML PO SUSP
30.0000 mL | Freq: Four times a day (QID) | ORAL | Status: DC | PRN
  Administered 2016-07-21: 30 mL via ORAL
  Filled 2016-07-21: qty 30

## 2016-07-21 MED ORDER — ACETAMINOPHEN 325 MG PO TABS
650.0000 mg | ORAL_TABLET | Freq: Four times a day (QID) | ORAL | Status: DC | PRN
  Administered 2016-07-22 (×3): 650 mg via ORAL
  Filled 2016-07-21 (×3): qty 2

## 2016-07-21 MED ORDER — HEPARIN BOLUS VIA INFUSION
4000.0000 [IU] | Freq: Once | INTRAVENOUS | Status: AC
  Administered 2016-07-21: 4000 [IU] via INTRAVENOUS
  Filled 2016-07-21: qty 4000

## 2016-07-21 MED ORDER — PANTOPRAZOLE SODIUM 40 MG PO TBEC
40.0000 mg | DELAYED_RELEASE_TABLET | Freq: Every day | ORAL | Status: DC
  Administered 2016-07-21 – 2016-07-23 (×3): 40 mg via ORAL
  Filled 2016-07-21 (×3): qty 1

## 2016-07-21 MED ORDER — NITROGLYCERIN 0.4 MG SL SUBL: 0.4000 mg | SUBLINGUAL_TABLET | SUBLINGUAL | Status: DC | PRN

## 2016-07-21 MED ORDER — ENOXAPARIN SODIUM 40 MG/0.4ML ~~LOC~~ SOLN: 40.0000 mg | SUBCUTANEOUS | Status: DC

## 2016-07-21 MED ORDER — ONDANSETRON HCL 4 MG PO TABS: 4.0000 mg | ORAL_TABLET | Freq: Four times a day (QID) | ORAL | Status: DC | PRN

## 2016-07-21 MED ORDER — SODIUM CHLORIDE 0.9 % IV SOLN
INTRAVENOUS | Status: AC
  Administered 2016-07-21: 03:00:00 via INTRAVENOUS

## 2016-07-21 MED ORDER — ACETAMINOPHEN 650 MG RE SUPP: 650.0000 mg | Freq: Four times a day (QID) | RECTAL | Status: DC | PRN

## 2016-07-21 MED ORDER — HEPARIN (PORCINE) IN NACL 100-0.45 UNIT/ML-% IJ SOLN
1700.0000 [IU]/h | INTRAMUSCULAR | Status: DC
  Administered 2016-07-21: 1300 [IU]/h via INTRAVENOUS
  Administered 2016-07-22 (×2): 1500 [IU]/h via INTRAVENOUS
  Filled 2016-07-21 (×3): qty 250

## 2016-07-21 MED ORDER — SIMVASTATIN 40 MG PO TABS
40.0000 mg | ORAL_TABLET | Freq: Every day | ORAL | Status: DC
  Administered 2016-07-21 – 2016-07-22 (×2): 40 mg via ORAL
  Filled 2016-07-21 (×2): qty 1

## 2016-07-21 MED ORDER — ALBUTEROL SULFATE (2.5 MG/3ML) 0.083% IN NEBU: 2.5000 mg | INHALATION_SOLUTION | Freq: Four times a day (QID) | RESPIRATORY_TRACT | Status: DC | PRN

## 2016-07-21 MED ORDER — HEPARIN BOLUS VIA INFUSION
1500.0000 [IU] | Freq: Once | INTRAVENOUS | Status: AC
  Administered 2016-07-21: 1500 [IU] via INTRAVENOUS
  Filled 2016-07-21: qty 1500

## 2016-07-21 MED ORDER — ONDANSETRON HCL 4 MG/2ML IJ SOLN: 4.0000 mg | Freq: Four times a day (QID) | INTRAMUSCULAR | Status: DC | PRN

## 2016-07-21 NOTE — Progress Notes (Signed)
CBG checked 287

## 2016-07-21 NOTE — Progress Notes (Signed)
Patient ID: Danny Scrapeobert M Pfister Sr., male   DOB: 04-11-1951, 65 y.o.   MRN: [id number]    CARDIOLOGY CONSULT NOTE  Patient ID: Danny Friendsobert M Malcomb Sr. MRN: [id number] DOB/AGE: 04-11-1951 65 y.o.  Admit date: 07/20/2016 Primary Cardiologist: Mariah MillingGollan Reason for Consultation: NSTEMI  HPI: 65 yo with history of CAD, COPD, CKD, and diabetes presented with chest pain.  Patient had PCI to LAD in 6/11.  Apparently had EF down to 40% at one point but no prior echo in system.  Has had chest pain on and off for a number of months.  No definite trigger.  Says that NTG usually makes it resolve quickly.  However, last night, he had central chest tightness at rest.  It got a little better with NTG then worsened again.  He took NTG x 4 with some but not complete improvement but then worsening each time. Given persistence of pain for around an hour, he came to ER.  In ER, CP resolved without intervention and he has been CP-free since that time.  Troponin noted to be elevated to 0.12.  ECG showed NSR with PACs and nonspecific lateral T wave changes.  SBP in 130s.  Initial creatinine 1.54, improved today to 1.28.  No complaints currently.   Review of systems complete and found to be negative unless listed above in HPI  Past Medical History: 1. CAD: s/p PCI to LAD in 6/11.  2. Ischemic cardiomyopathy: EF 40% in past per report, do not see prior echo in system.  3. COPD: Prior smoker.  4. Type II diabetes 5. CKD: Diabetic nephropathy.    Family History  Problem Relation Age of Onset  . Heart attack Father     complications    Social History   Social History  . Marital status: Single    Spouse name: N/A  . Number of children: N/A  . Years of education: N/A   Occupational History  . Not on file.   Social History Main Topics  . Smoking status: Former Smoker    Packs/day: 3.00    Years: 0.00    Types: Cigarettes    Quit date: 09/07/2009  . Smokeless tobacco: Never Used     Comment: quit june 2011  . Alcohol  use Yes  . Drug use: No  . Sexual activity: Not on file   Other Topics Concern  . Not on file   Social History Narrative  . No narrative on file     Prescriptions Prior to Admission  Medication Sig Dispense Refill Last Dose  . albuterol (PROVENTIL) (2.5 MG/3ML) 0.083% nebulizer solution Take 2.5 mg by nebulization every 6 (six) hours as needed for wheezing or shortness of breath.   prn at prn  . aspirin 81 MG tablet Take 81 mg by mouth daily.     07/20/2016 at Unknown time  . carvedilol (COREG) 25 MG tablet Take 1 tablet (25 mg total) by mouth 2 (two) times daily with a meal. 60 tablet 11 07/20/2016 at Unknown time  . clopidogrel (PLAVIX) 75 MG tablet Take 1 tablet (75 mg total) by mouth daily. 30 tablet 11 07/20/2016 at Unknown time  . empagliflozin (JARDIANCE) 25 MG TABS tablet Take 25 mg by mouth daily.   07/20/2016 at Unknown time  . insulin glargine (LANTUS) 100 UNIT/ML injection Inject 120 Units into the skin 2 (two) times daily.    07/20/2016 at Unknown time  . isosorbide mononitrate (IMDUR) 30 MG 24 hr tablet Take 1 tablet (30 mg  total) by mouth 2 (two) times daily. 60 tablet 11 07/20/2016 at Unknown time  . linagliptin (TRADJENTA) 5 MG TABS tablet Take 5 mg by mouth daily.   07/20/2016 at Unknown time  . nitroGLYCERIN (NITROSTAT) 0.4 MG SL tablet Place 0.4 mg under the tongue every 5 (five) minutes as needed for chest pain.    prn at prn  . simvastatin (ZOCOR) 40 MG tablet TAKE ONE TABLET BY MOUTH AT BEDTIME 30 tablet 11 Past Week at Unknown time    Physical exam Blood pressure 131/90, pulse 78, temperature 97.6 F (36.4 C), temperature source Oral, resp. rate 20, height 6' (1.829 m), weight 258 lb 6.4 oz (117.2 kg), SpO2 99 %. General: NAD Neck: Thick, JVP 8 cm, no thyromegaly or thyroid nodule.  Lungs: Distant BS, occasional rhonchi.  CV: Nondisplaced PMI.  Heart regular S1/S2, no S3/S4, no murmur.  1+ ankle edema.  No carotid bruit.  Normal pedal pulses.  Abdomen: Soft,  nontender, no hepatosplenomegaly, no distention.  Skin: Intact without lesions or rashes.  Neurologic: Alert and oriented x 3.  Psych: Normal affect. Extremities: No clubbing or cyanosis.  HEENT: Normal.   Labs:   Lab Results  Component Value Date   WBC 7.0 07/21/2016   HGB 13.8 07/21/2016   HCT 41.5 07/21/2016   MCV 92.7 07/21/2016   PLT 220 07/21/2016    Recent Labs Lab 07/20/16 2234 07/21/16 0730  NA 133* 139  K 5.1 4.5  CL 103 111  CO2 22 25  BUN 40* 34*  CREATININE 1.54* 1.28*  CALCIUM 8.9 8.3*  PROT 7.0  --   BILITOT 0.4  --   ALKPHOS 95  --   ALT 21  --   AST 23  --   GLUCOSE 595* 122*   Lab Results  Component Value Date   TROPONINI 0.12 (HH) 07/21/2016   No results found for: CHOL No results found for: HDL No results found for: LDLCALC No results found for: TRIG No results found for: CHOLHDL No results found for: LDLDIRECT    Radiology: - CXR: emphysema  EKG: NSR, PACs, lateral nonspecific T wave changes  ASSESSMENT AND PLAN: 65 yo with history of CAD, COPD, CKD, and diabetes presented with chest pain. 1. CAD: NSTEMI with troponin to 0.12 and nonspecific T wave changes.  He has history of CAD with prior LAD intervention in 6/11.  Currently chest pain-free.  - Would start heparin gtt with elevated troponin.  - Continue ASA 81, Plavix.  - Continue Coreg and Imdur at current doses.   - Check lipids, will likely need to intensify statin.  - Would plan for cardiac cath on Monday morning.  Risks/benefits of procedure discussed with patient and he agrees to procedure.  2. CKD: Stage III, suspect diabetic nephropathy.  Creatinine down with hydration overnight.  Would be careful with hydration given probably mild volume overload.  Would hold further IV fluid for now, give more Monday am prior to cath.  3. Ischemic cardiomyopathy: Possible mild volume overload.  No recent echo, but EF 40% reported from some time in the past.   - Stop IV fluid until just  pre-cath.  - Echocardiogram.  - Continue Coreg.  - Hold off on ACEI for now with CKD and need for cath.  4. COPD: No longer smoking.   Marca AnconaDalton Morganna Styles 07/21/2016 12:50 PM    Signed: @ME1 @ 07/21/2016, 12:38 PM Co-Sign MD

## 2016-07-21 NOTE — ED Notes (Signed)
Transporting patient to room 255-2A 

## 2016-07-21 NOTE — Progress Notes (Signed)
ANTICOAGULATION CONSULT NOTE - Initial Consult  Pharmacy Consult for heparin bolus and drip Indication: chest pain/ACS  No Known Allergies  Patient Measurements: Height: 6' (182.9 cm) Weight: 258 lb 6.4 oz (117.2 kg) IBW/kg (Calculated) : 77.6 Heparin Dosing Weight: 103 kg  Vital Signs: Temp: 97.6 F (36.4 C) (12/16 1147) Temp Source: Oral (12/16 1147) BP: 131/90 (12/16 1147) Pulse Rate: 78 (12/16 1147)  Labs:  Recent Labs  07/20/16 2234 07/21/16 0134 07/21/16 0730  HGB 15.1  --  13.8  HCT 45.9  --  41.5  PLT 230  --  220  CREATININE 1.54*  --  1.28*  TROPONINI 0.03* 0.07* 0.12*    Estimated Creatinine Clearance: 76 mL/min (by C-G formula based on SCr of 1.28 mg/dL (H)).   Medical History: Past Medical History:  Diagnosis Date  . CAD (coronary artery disease)   . COPD (chronic obstructive pulmonary disease) (HCC)   . DM2 (diabetes mellitus, type 2) (HCC)   . HLD (hyperlipidemia)   . HTN (hypertension)   . Non-Q wave myocardial infarction (HCC)    mild    Medications:  Scheduled:  . aspirin EC  81 mg Oral Daily  . carvedilol  25 mg Oral BID WC  . clopidogrel  75 mg Oral Daily  . insulin aspart  0-9 Units Subcutaneous Q6H  . isosorbide mononitrate  30 mg Oral BID  . pantoprazole  40 mg Oral Daily  . simvastatin  40 mg Oral q1800   Infusions:  . heparin 1,300 Units/hr (07/21/16 1306)    Assessment: Pharmacy has been consulted to dose heparin bolus and drip in this 65 yoM with increasing troponin. Per PTA medication list and Dr. Amado CoeGouru, patient has not been taking oral anticoagulant. Baseline labs have been ordered.  Goal of Therapy:  Heparin level 0.3-0.7 units/ml Monitor platelets by anticoagulation protocol: Yes   Plan:  Give 4000 units bolus x 1 Start heparin infusion at 1300 units/hr Check anti-Xa level in 6 hours and daily while on heparin Continue to monitor H&H and platelets  Horris LatinoHolly Robertta Halfhill, PharmD Pharmacy Resident 07/21/2016 1:37 PM

## 2016-07-21 NOTE — Care Management Obs Status (Signed)
MEDICARE OBSERVATION STATUS NOTIFICATION   Patient Details  Name: Bennett ScrapeRobert M Wronski Sr. MRN: [id number] Date of Birth: Jan 13, 1951   Medicare Observation Status Notification Given:  No (discharge order in less than 24 hours)    Caren MacadamMichelle Lashannon Bresnan, RN 07/21/2016, 11:22 AM

## 2016-07-21 NOTE — H&P (Signed)
[facility] Physicians - [place] at [facility]   PATIENT NAME: Danny [name]    MR#:  [id number]  DATE OF BIRTH:  1950/12/18  DATE OF ADMISSION:  07/20/2016  PRIMARY CARE PHYSICIAN: [name], MD   REQUESTING/REFERRING PHYSICIAN: Manson Passey, MD  CHIEF COMPLAINT:   Chief Complaint  Patient presents with  . Chest Pain    HISTORY OF PRESENT ILLNESS:  Danny [name]  is a 65 y.o. male who presents with Central chest pain. Patient states he was at home today when he began to have substernal chest pressure which radiated to his left arm. He took nitroglycerin with minimal relief, but then his pain recurred again within about 10 minutes. This cycle repeated about 4 times until he decided to come to the ED for evaluation. He does have a history significant for CAD with prior stent placement. Here in the ED his chest pain has currently resolved. Initial troponin was barely positive at 0.03. Hospitals were called for admission and further evaluation. Of note, his blood glucose was also noted to be almost 600, with normal anion gap.  PAST MEDICAL HISTORY:   Past Medical History:  Diagnosis Date  . CAD (coronary artery disease)   . COPD (chronic obstructive pulmonary disease) (HCC)   . DM2 (diabetes mellitus, type 2) (HCC)   . HLD (hyperlipidemia)   . HTN (hypertension)   . Non-Q wave myocardial infarction (HCC)    mild    PAST SURGICAL HISTORY:   Past Surgical History:  Procedure Laterality Date  . CAD: stent to the LAD    . CARDIAC CATHETERIZATION  10/01/2014  . CORONARY ANGIOPLASTY WITH STENT PLACEMENT  10/01/2014    SOCIAL HISTORY:   Social History  Substance Use Topics  . Smoking status: Former Smoker    Packs/day: 3.00    Years: 0.00    Types: Cigarettes    Quit date: 09/07/2009  . Smokeless tobacco: Never Used     Comment: quit june 2011  . Alcohol use Yes    FAMILY HISTORY:   Family History  Problem Relation Age of Onset  . Heart attack Father      complications    DRUG ALLERGIES:  No Known Allergies  MEDICATIONS AT HOME:   Prior to Admission medications   Medication Sig Start Date End Date Taking? Authorizing Provider  albuterol (PROVENTIL) (2.5 MG/3ML) 0.083% nebulizer solution Take 2.5 mg by nebulization every 6 (six) hours as needed for wheezing or shortness of breath.    Historical Provider, MD  aspirin 81 MG tablet Take 81 mg by mouth daily.      Historical Provider, MD  carvedilol (COREG) 25 MG tablet Take 1 tablet (25 mg total) by mouth 2 (two) times daily with a meal. 06/18/16   [name], MD  cetirizine (ZYRTEC) 10 MG tablet Take 10 mg by mouth daily.    Historical Provider, MD  clopidogrel (PLAVIX) 75 MG tablet Take 1 tablet (75 mg total) by mouth daily. 06/18/16   [name], MD  cyclobenzaprine (FLEXERIL) 5 MG tablet Take 1 tablet (5 mg total) by mouth 3 (three) times daily as needed for muscle spasms. 03/27/16   [name], PA-C  empagliflozin (JARDIANCE) 25 MG TABS tablet Take 25 mg by mouth daily.    Historical Provider, MD  insulin glargine (LANTUS) 100 UNIT/ML injection Inject 80 Units into the skin 2 (two) times daily.     Historical Provider, MD  isosorbide mononitrate (IMDUR) 30 MG 24  hr tablet Take 1 tablet (30 mg total) by mouth 2 (two) times daily. 06/18/16   Antonieta Ibaimothy J Gollan, MD  linagliptin (TRADJENTA) 5 MG TABS tablet Take 5 mg by mouth daily.    Historical Provider, MD  methocarbamol (ROBAXIN) 500 MG tablet Take 1 tablet (500 mg total) by mouth 2 (two) times daily. 10/06/15   Garlon HatchetLisa M Sanders, PA-C  naproxen (NAPROSYN) 500 MG tablet Take 1 tablet (500 mg total) by mouth 2 (two) times daily with a meal. 10/06/15   Garlon HatchetLisa M Sanders, PA-C  nitroGLYCERIN (NITROSTAT) 0.4 MG SL tablet Place 0.4 mg under the tongue every 5 (five) minutes as needed.      Historical Provider, MD  pantoprazole (PROTONIX) 40 MG tablet Take 1 tablet (40 mg total) by mouth daily. 12/20/14   Antonieta Ibaimothy J Gollan, MD   simvastatin (ZOCOR) 40 MG tablet TAKE ONE TABLET BY MOUTH AT BEDTIME 06/18/16   Antonieta Ibaimothy J Gollan, MD  traMADol (ULTRAM) 50 MG tablet Take 1 tablet (50 mg total) by mouth every 6 (six) hours as needed. 03/27/16   Charlesetta IvoryJenise V Bacon Menshew, PA-C    REVIEW OF SYSTEMS:  Review of Systems  Constitutional: Negative for chills, fever, malaise/fatigue and weight loss.  HENT: Negative for ear pain, hearing loss and tinnitus.   Eyes: Negative for blurred vision, double vision, pain and redness.  Respiratory: Positive for shortness of breath. Negative for cough and hemoptysis.   Cardiovascular: Positive for chest pain. Negative for palpitations, orthopnea and leg swelling.  Gastrointestinal: Negative for abdominal pain, constipation, diarrhea, nausea and vomiting.  Genitourinary: Negative for dysuria, frequency and hematuria.  Musculoskeletal: Negative for back pain, joint pain and neck pain.  Skin:       No acne, rash, or lesions  Neurological: Negative for dizziness, tremors, focal weakness and weakness.  Endo/Heme/Allergies: Negative for polydipsia. Does not bruise/bleed easily.  Psychiatric/Behavioral: Negative for depression. The patient is not nervous/anxious and does not have insomnia.      VITAL SIGNS:   Vitals:   07/20/16 2215 07/20/16 2230 07/20/16 2300 07/20/16 2330  BP: (!) 173/104 (!) 163/86 (!) 143/102 (!) 144/95  Pulse: 95 95 94 91  Resp: (!) 22 (!) 22 (!) 21 19  Temp: 97.8 F (36.6 C)     SpO2: 95% 96% 92% 95%   Wt Readings from Last 3 Encounters:  06/18/16 117.7 kg (259 lb 8 oz)  05/01/16 116.6 kg (257 lb)  03/27/16 110.2 kg (243 lb)    PHYSICAL EXAMINATION:  Physical Exam  Vitals reviewed. Constitutional: He is oriented to person, place, and time. He appears well-developed and well-nourished. No distress.  HENT:  Head: Normocephalic and atraumatic.  Mouth/Throat: Oropharynx is clear and moist.  Eyes: Conjunctivae and EOM are normal. Pupils are equal, round, and  reactive to light. No scleral icterus.  Neck: Normal range of motion. Neck supple. No JVD present. No thyromegaly present.  Cardiovascular: Normal rate, regular rhythm and intact distal pulses.  Exam reveals no gallop and no friction rub.   No murmur heard. Respiratory: Effort normal and breath sounds normal. No respiratory distress. He has no wheezes. He has no rales.  GI: Soft. Bowel sounds are normal. He exhibits no distension. There is no tenderness.  Musculoskeletal: Normal range of motion. He exhibits no edema.  No arthritis, no gout  Lymphadenopathy:    He has no cervical adenopathy.  Neurological: He is alert and oriented to person, place, and time. No cranial nerve deficit.  No dysarthria, no  aphasia  Skin: Skin is warm and dry. No rash noted. No erythema.  Psychiatric: He has a normal mood and affect. His behavior is normal. Judgment and thought content normal.    LABORATORY PANEL:   CBC  Recent Labs Lab 07/20/16 2234  WBC 6.2  HGB 15.1  HCT 45.9  PLT 230   ------------------------------------------------------------------------------------------------------------------  Chemistries   Recent Labs Lab 07/20/16 2234  NA 133*  K 5.1  CL 103  CO2 22  GLUCOSE 595*  BUN 40*  CREATININE 1.54*  CALCIUM 8.9  AST 23  ALT 21  ALKPHOS 95  BILITOT 0.4   ------------------------------------------------------------------------------------------------------------------  Cardiac Enzymes  Recent Labs Lab 07/20/16 2234  TROPONINI 0.03*   ------------------------------------------------------------------------------------------------------------------  RADIOLOGY:  Dg Chest 2 View  Result Date: 07/20/2016 CLINICAL DATA:  Initial evaluation for acute chest pain. EXAM: CHEST  2 VIEW COMPARISON:  Prior radiograph from 05/15/2015. FINDINGS: Cardiac and mediastinal silhouettes are stable in size and contour, and remain within normal limits. Lungs are mildly  hyperinflated with changes compatible with emphysema. Right perihilar scarring. No focal infiltrates. No pulmonary edema or pleural effusion. No pneumothorax. No acute osseous abnormality. IMPRESSION: 1. No active cardiopulmonary disease. 2. Emphysema with right perihilar scarring, similar to previous. Electronically Signed   By: Rise MuBenjamin  McClintock M.D.   On: 07/20/2016 23:01    EKG:   Orders placed or performed during the hospital encounter of 07/20/16  . ED EKG within 10 minutes  . ED EKG within 10 minutes  . EKG 12-Lead  . EKG 12-Lead    IMPRESSION AND PLAN:  Principal Problem:   Unstable angina (HCC) - patient has known CAD with prior stent placement and presents tonight with an episode of prolonged central chest pressure and mildly positive troponin. Chest pain currently resolved. We will trend his cardiac enzymes tonight, and get an echocardiogram and cardiology consult in the morning. Active Problems:   Diabetes mellitus type 2, uncontrolled, with complications (HCC) - glucose significantly elevated, patient will get IV fluids and then insulin to bring his glucose down to a more acceptable level.   HYPERTENSION, BENIGN - continue home meds   CAD (coronary artery disease) - workup as above, continue home meds   COPD (chronic obstructive pulmonary disease) (HCC) - continue home inhalers   Hyperlipidemia - home dose statin   GERD (gastroesophageal reflux disease) - home dose PPI  All the records are reviewed and case discussed with ED provider. Management plans discussed with the patient and/or family.  DVT PROPHYLAXIS: SubQ lovenox  GI PROPHYLAXIS: PPI  ADMISSION STATUS: Observation  CODE STATUS: Full Code Status History    This patient does not have a recorded code status. Please follow your organizational policy for patients in this situation.      TOTAL TIME TAKING CARE OF THIS PATIENT: 40 minutes.    Lenisha Lacap FIELDING 07/21/2016, 12:59 AM  [facility]  Office  [phone]  CC: Primary care physician; Evelene CroonNIEMEYER, MEINDERT, MD

## 2016-07-21 NOTE — Progress Notes (Signed)
ANTICOAGULATION CONSULT NOTE - Initial Consult  Pharmacy Consult for heparin bolus and drip Indication: chest pain/ACS  No Known Allergies  Patient Measurements: Height: 6' (182.9 cm) Weight: 258 lb 6.4 oz (117.2 kg) IBW/kg (Calculated) : 77.6 Heparin Dosing Weight: 103 kg  Vital Signs: Temp: 98 F (36.7 C) (12/16 1917) Temp Source: Oral (12/16 1917) BP: 154/105 (12/16 1917) Pulse Rate: 90 (12/16 1917)  Labs:  Recent Labs  07/20/16 2234  07/21/16 0730 07/21/16 1341 07/21/16 1923  HGB 15.1  --  13.8  --   --   HCT 45.9  --  41.5  --   --   PLT 230  --  220  --   --   APTT  --   --   --  133*  --   LABPROT  --   --   --  14.7  --   INR  --   --   --  1.14  --   HEPARINUNFRC  --   --   --   --  0.27*  CREATININE 1.54*  --  1.28*  --   --   TROPONINI 0.03*  < > 0.12* 0.12* 0.09*  < > = values in this interval not displayed.  Estimated Creatinine Clearance: 76 mL/min (by C-G formula based on SCr of 1.28 mg/dL (H)).   Medical History: Past Medical History:  Diagnosis Date  . CAD (coronary artery disease)   . COPD (chronic obstructive pulmonary disease) (HCC)   . DM2 (diabetes mellitus, type 2) (HCC)   . HLD (hyperlipidemia)   . HTN (hypertension)   . Non-Q wave myocardial infarction (HCC)    mild    Medications:  Scheduled:  . aspirin EC  81 mg Oral Daily  . carvedilol  25 mg Oral BID WC  . clopidogrel  75 mg Oral Daily  . insulin aspart  0-9 Units Subcutaneous Q6H  . isosorbide mononitrate  30 mg Oral BID  . pantoprazole  40 mg Oral Daily  . simvastatin  40 mg Oral q1800   Infusions:  . heparin 1,300 Units/hr (07/21/16 1306)    Assessment: Pharmacy has been consulted to dose heparin bolus and drip in this 65 yoM with increasing troponin. Per PTA medication list and Dr. Amado CoeGouru, patient has not been taking oral anticoagulant. Baseline labs have been ordered.  Goal of Therapy:  Heparin level 0.3-0.7 units/ml Monitor platelets by anticoagulation  protocol: Yes   Plan:  12/16 1930: HL: 0.27   Heparin level is subtherapeutic. Will order heparin 1500 unit bolus and increase infusion to Heparin 1500units/hr. Will recheck heparin level in 6 hours.   Gardner CandleSheema M Everlena Mackley, PharmD, BCPS Clinical Pharmacist 07/21/2016 8:30 PM

## 2016-07-21 NOTE — Progress Notes (Addendum)
[facility] -  at [facility]   PATIENT NAME: Danny EdinRobert [name]    MR#:  [id number]  DATE OF BIRTH:  [birth year]  SUBJECTIVE:  CHIEF COMPLAINT:  Patient is chest pain-free during my examination. Denies any chest tightness or shortness of breath either. Denies any dizziness  REVIEW OF SYSTEMS:  CONSTITUTIONAL: No fever, fatigue or weakness.  EYES: No blurred or double vision.  EARS, NOSE, AND THROAT: No tinnitus or ear pain.  RESPIRATORY: No cough, shortness of breath, wheezing or hemoptysis.  CARDIOVASCULAR: No chest pain, orthopnea, edema.  GASTROINTESTINAL: No nausea, vomiting, diarrhea or abdominal pain.  GENITOURINARY: No dysuria, hematuria.  ENDOCRINE: No polyuria, nocturia,  HEMATOLOGY: No anemia, easy bruising or bleeding SKIN: No rash or lesion. MUSCULOSKELETAL: No joint pain or arthritis.   NEUROLOGIC: No tingling, numbness, weakness.  PSYCHIATRY: No anxiety or depression.   DRUG ALLERGIES:  No Known Allergies  VITALS:  Blood pressure 131/90, pulse 78, temperature 97.6 F (36.4 C), temperature source Oral, resp. rate 20, height 6' (1.829 m), weight 117.2 kg (258 lb 6.4 oz), SpO2 99 %.  PHYSICAL EXAMINATION:  GENERAL:  65 y.o.-year-old patient lying in the bed with no acute distress.  EYES: Pupils equal, round, reactive to light and accommodation. No scleral icterus. Extraocular muscles intact.  HEENT: Head atraumatic, normocephalic. Oropharynx and nasopharynx clear.  NECK:  Supple, no jugular venous distention. No thyroid enlargement, no tenderness.  LUNGS: Normal breath sounds bilaterally, no wheezing, rales,rhonchi or crepitation. No use of accessory muscles of respiration.  CARDIOVASCULAR: S1, S2 normal. No murmurs, rubs, or gallops.  ABDOMEN: Soft, nontender, nondistended. Bowel sounds present. No organomegaly or mass.  EXTREMITIES: No pedal edema, cyanosis, or clubbing.  NEUROLOGIC: Cranial nerves II through XII are intact. Muscle  strength 5/5 in all extremities. Sensation intact. Gait not checked.  PSYCHIATRIC: The patient is alert and oriented x 3.  SKIN: No obvious rash, lesion, or ulcer.    LABORATORY PANEL:   CBC  Recent Labs Lab 07/21/16 0730  WBC 7.0  HGB 13.8  HCT 41.5  PLT 220   ------------------------------------------------------------------------------------------------------------------  Chemistries   Recent Labs Lab 07/20/16 2234 07/21/16 0730  NA 133* 139  K 5.1 4.5  CL 103 111  CO2 22 25  GLUCOSE 595* 122*  BUN 40* 34*  CREATININE 1.54* 1.28*  CALCIUM 8.9 8.3*  AST 23  --   ALT 21  --   ALKPHOS 95  --   BILITOT 0.4  --    ------------------------------------------------------------------------------------------------------------------  Cardiac Enzymes  Recent Labs Lab 07/21/16 0730  TROPONINI 0.12*   ------------------------------------------------------------------------------------------------------------------  RADIOLOGY:  Dg Chest 2 View  Result Date: 07/20/2016 CLINICAL DATA:  Initial evaluation for acute chest pain. EXAM: CHEST  2 VIEW COMPARISON:  Prior radiograph from 05/15/2015. FINDINGS: Cardiac and mediastinal silhouettes are stable in size and contour, and remain within normal limits. Lungs are mildly hyperinflated with changes compatible with emphysema. Right perihilar scarring. No focal infiltrates. No pulmonary edema or pleural effusion. No pneumothorax. No acute osseous abnormality. IMPRESSION: 1. No active cardiopulmonary disease. 2. Emphysema with right perihilar scarring, similar to previous. Electronically Signed   By: Rise MuBenjamin  McClintock M.D.   On: 07/20/2016 23:01    EKG:   Orders placed or performed during the hospital encounter of 07/20/16  . EKG 12-Lead  . EKG 12-Lead    ASSESSMENT AND PLAN:   #Unstable angina (HCC) - troponins 0.03-0.07-0.12 patient has known CAD with prior stent placement and presents  with an episode  of prolonged  central chest pressure and mildly positive troponin.  Chest pain currently resolved.  Echocardiogram is pending Started patient on heparin drip Discussed with cardiology considering cardiac catheterization on Monday Continue aspirin 81 mg, Plavix, Coreg and statin #.  #  Diabetes mellitus type 2, uncontrolled, with complications (HCC) - glucose significantly elevated, patient will get IV fluids and then insulin to bring his glucose down to a more acceptable level.   # HYPERTENSION, essential- continue Coreg   # CAD (coronary artery disease) -  continue aspirin, Plavix, Coreg and statin     #COPD (chronic obstructive pulmonary disease) (HCC) - continue home inhalers    # Hyperlipidemia - home dose statin     #GERD (gastroesophageal reflux disease) -  PPI     All the records are reviewed and case discussed with Care Management/Social Workerr. Management plans discussed with the patient, family and they are in agreement.  CODE STATUS: Full code  TOTAL TIME TAKING CARE OF THIS PATIENT: 36 minutes.   POSSIBLE D/C IN 2 DAYS, DEPENDING ON CLINICAL CONDITION.  Note: This dictation was prepared with Dragon dictation along with smaller phrase technology. Any transcriptional errors that result from this process are unintentional.   Ramonita LabGouru, [place] M.D on 07/21/2016 at 12:31 PM  Between 7am to 6pm - Pager - [phone] After 6pm go to [email or web address] - password [email or web address] [facility]  [facility]  Office  [phone]  CC: Primary care physician; Evelene CroonNIEMEYER, MEINDERT, MD

## 2016-07-21 NOTE — Progress Notes (Signed)
*  PRELIMINARY RESULTS* Echocardiogram 2D Echocardiogram has been performed. Echo completed with [employer] health practitioner(Image Enhancing Agent).  Danny [name] 07/21/2016, 8:38 AM

## 2016-07-22 LAB — BASIC METABOLIC PANEL
Anion gap: 5 (ref 5–15)
Anion gap: 5 (ref 5–15)
BUN: 23 mg/dL — ABNORMAL HIGH (ref 6–20)
BUN: 22 mg/dL — ABNORMAL HIGH (ref 6–20)
CO2: 24 mmol/L (ref 22–32)
CO2: 24 mmol/L (ref 22–32)
Calcium: 8.7 mg/dL — ABNORMAL LOW (ref 8.9–10.3)
Calcium: 8.4 mg/dL — ABNORMAL LOW (ref 8.9–10.3)
Chloride: 110 mmol/L (ref 101–111)
Chloride: 106 mmol/L (ref 101–111)
Creatinine, Ser: 1.21 mg/dL (ref 0.61–1.24)
Creatinine, Ser: 1.27 mg/dL — ABNORMAL HIGH (ref 0.61–1.24)
GFR calc Af Amer: >60 mL/min (ref >60)
GFR calc Af Amer: >60 mL/min (ref >60)
GFR calc non Af Amer: >60 mL/min (ref >60)
GFR calc non Af Amer: 58 mL/min — ABNORMAL LOW (ref >60)
Glucose, Bld: 87 mg/dL (ref 65–99)
Glucose, Bld: 234 mg/dL — ABNORMAL HIGH (ref 65–99)
Potassium: 4.2 mmol/L (ref 3.5–5.1)
Potassium: 4.4 mmol/L (ref 3.5–5.1)
Sodium: 139 mmol/L (ref 135–145)
Sodium: 135 mmol/L (ref 135–145)

## 2016-07-22 LAB — LIPID PANEL
Cholesterol: 109 mg/dL (ref 0–200)
HDL: 42 mg/dL (ref >40)
LDL Cholesterol: 44 mg/dL (ref 0–99)
Total CHOL/HDL Ratio: 2.6 RATIO
Triglycerides: 116 mg/dL (ref <150)
VLDL: 23 mg/dL (ref 0–40)

## 2016-07-22 LAB — GLUCOSE, CAPILLARY
Glucose-Capillary: 148 mg/dL — ABNORMAL HIGH (ref 65–99)
Glucose-Capillary: 67 mg/dL (ref 65–99)
Glucose-Capillary: 78 mg/dL (ref 65–99)
Glucose-Capillary: 120 mg/dL — ABNORMAL HIGH (ref 65–99)
Glucose-Capillary: 211 mg/dL — ABNORMAL HIGH (ref 65–99)
Glucose-Capillary: 253 mg/dL — ABNORMAL HIGH (ref 65–99)
Glucose-Capillary: 256 mg/dL — ABNORMAL HIGH (ref 65–99)

## 2016-07-22 LAB — CBC
HCT: 41.3 % (ref 40.0–52.0)
Hemoglobin: 14 g/dL (ref 13.0–18.0)
MCH: 30.8 pg (ref 26.0–34.0)
MCHC: 33.9 g/dL (ref 32.0–36.0)
MCV: 91 fL (ref 80.0–100.0)
Platelets: 232 10*3/uL (ref 150–440)
RBC: 4.54 MIL/uL (ref 4.40–5.90)
RDW: 13 % (ref 11.5–14.5)
WBC: 8.8 10*3/uL (ref 3.8–10.6)

## 2016-07-22 LAB — ECHOCARDIOGRAM COMPLETE
Height: 72 in
Weight: 4134.4 oz

## 2016-07-22 LAB — HEPARIN LEVEL (UNFRACTIONATED)
Heparin Unfractionated: 0.38 IU/mL (ref 0.30–0.70)
Heparin Unfractionated: 0.37 IU/mL (ref 0.30–0.70)

## 2016-07-22 MED ORDER — ZOLPIDEM TARTRATE 5 MG PO TABS
5.0000 mg | ORAL_TABLET | Freq: Every evening | ORAL | Status: DC | PRN
  Administered 2016-07-22: 5 mg via ORAL
  Filled 2016-07-22: qty 1

## 2016-07-22 MED ORDER — LISINOPRIL 5 MG PO TABS
5.0000 mg | ORAL_TABLET | Freq: Every day | ORAL | Status: DC
  Administered 2016-07-22 – 2016-07-23 (×2): 5 mg via ORAL
  Filled 2016-07-22 (×2): qty 1

## 2016-07-22 NOTE — Progress Notes (Signed)
Patient ID: Danny M Obara Sr., male   DOB: 01/14/1951, 65 y.o.   MRN: [id number]   SUBJECTIVE: No further chest pain.  No complaints except for insomnia. BP now running high.   Scheduled Meds: . aspirin EC  81 mg Oral Daily  . carvedilol  25 mg Oral BID WC  . clopidogrel  75 mg Oral Daily  . insulin aspart  0-9 Units Subcutaneous Q6H  . isosorbide mononitrate  30 mg Oral BID  . lisinopril  5 mg Oral Daily  . pantoprazole  40 mg Oral Daily  . simvastatin  40 mg Oral q1800   Continuous Infusions: . heparin 1,500 Units/hr (07/22/16 0354)   PRN Meds:.acetaminophen **OR** acetaminophen, albuterol, alum & mag hydroxide-simeth, cyclobenzaprine, nitroGLYCERIN, ondansetron **OR** ondansetron (ZOFRAN) IV, oxyCODONE, traMADol     Vitals:   07/21/16 1917 07/21/16 2150 07/22/16 0347 07/22/16 0833  BP: (!) 154/105 135/68 (!) 154/95 (!) 175/102  Pulse: 90 84 90 87  Resp: 18  18 18  Temp: 98 F (36.7 C)  98.2 F (36.8 C) 97.7 F (36.5 C)  TempSrc: Oral  Oral Oral  SpO2: 98%  93% 93%  Weight:      Height:        Intake/Output Summary (Last 24 hours) at 07/22/16 1141 Last data filed at 07/22/16 0947  Gross per 24 hour  Intake              872 ml  Output              350 ml  Net              522 ml    LABS: Basic Metabolic Panel:  Recent Labs  07/21/16 0730 07/22/16 0418  NA 139 139  K 4.5 4.2  CL 111 110  CO2 25 24  GLUCOSE 122* 87  BUN 34* 23*  CREATININE 1.28* 1.21  CALCIUM 8.3* 8.7*   Liver Function Tests:  Recent Labs  07/20/16 2234  AST 23  ALT 21  ALKPHOS 95  BILITOT 0.4  PROT 7.0  ALBUMIN 3.9    Recent Labs  07/20/16 2234  LIPASE 52*   CBC:  Recent Labs  07/21/16 0730 07/22/16 0418  WBC 7.0 8.8  HGB 13.8 14.0  HCT 41.5 41.3  MCV 92.7 91.0  PLT 220 232   Cardiac Enzymes:  Recent Labs  07/21/16 0730 07/21/16 1341 07/21/16 1923  TROPONINI 0.12* 0.12* 0.09*   BNP: Invalid input(s): POCBNP D-Dimer: No results for input(s): DDIMER  in the last 72 hours. Hemoglobin A1C: No results for input(s): HGBA1C in the last 72 hours. Fasting Lipid Panel:  Recent Labs  07/22/16 0418  CHOL 109  HDL 42  LDLCALC 44  TRIG 116  CHOLHDL 2.6   Thyroid Function Tests: No results for input(s): TSH, T4TOTAL, T3FREE, THYROIDAB in the last 72 hours.  Invalid input(s): FREET3 Anemia Panel: No results for input(s): VITAMINB12, FOLATE, FERRITIN, TIBC, IRON, RETICCTPCT in the last 72 hours.  RADIOLOGY: Dg Chest 2 View  Result Date: 07/20/2016 CLINICAL DATA:  Initial evaluation for acute chest pain. EXAM: CHEST  2 VIEW COMPARISON:  Prior radiograph from 05/15/2015. FINDINGS: Cardiac and mediastinal silhouettes are stable in size and contour, and remain within normal limits. Lungs are mildly hyperinflated with changes compatible with emphysema. Right perihilar scarring. No focal infiltrates. No pulmonary edema or pleural effusion. No pneumothorax. No acute osseous abnormality. IMPRESSION: 1. No active cardiopulmonary disease. 2. Emphysema with right perihilar scarring, similar to   previous. Electronically Signed   By: Rise MuBenjamin  McClintock M.D.   On: 07/20/2016 23:01    PHYSICAL EXAM General: NAD Neck: No JVD, no thyromegaly or thyroid nodule.  Lungs: Clear to auscultation bilaterally with normal respiratory effort. CV: Nondisplaced PMI.  Heart regular S1/S2, no S3/S4, no murmur.  Trac ankle edema.  No carotid bruit.  Normal pedal pulses.  Abdomen: Soft, nontender, no hepatosplenomegaly, no distention.  Neurologic: Alert and oriented x 3.  Psych: Normal affect. Extremities: No clubbing or cyanosis.   TELEMETRY: Reviewed telemetry pt in NSR  ASSESSMENT AND PLAN: 65 yo with history of CAD, COPD, CKD, and diabetes presented with chest pain. 1. CAD: NSTEMI with troponin peak 0.12 and nonspecific T wave changes.  He has history of CAD with prior LAD intervention in 6/11.  Currently chest pain-free.  - Continue heparin gtt - Continue ASA  81, Plavix.  - Continue Coreg and Imdur at current doses.   - Good LDL on current statin.   - Plan for cath tomorrow, orders put in and procedure discussed with patient.  He understands risks/benefits and agrees to proceed. 2. CKD: Stage III, suspect diabetic nephropathy. Creatinine improved. Will get pre-cath hydration.  3. Ischemic cardiomyopathy: No recent echo, but EF 40% reported from some time in the past.   - Echocardiogram.  - Continue Coreg.  - With high BP and better creatinine, will add lisinopril 5 mg daily.  4. COPD: No longer smoking.   Danny AnconaDalton Baltasar [name] 07/22/2016 11:44 AM

## 2016-07-22 NOTE — Progress Notes (Signed)
[facility] [place] [facility]   PATIENT NAME: Danny EdinRobert [name]    MR#:  [id number]  DATE OF BIRTH:  [birth year]  SUBJECTIVE:  CHIEF COMPLAINT:  Patient is chest pain-free during my examination. Denies any chest tightness or shortness of breath either. Denies any dizziness  REVIEW OF SYSTEMS:  CONSTITUTIONAL: No fever, fatigue or weakness.  EYES: No blurred or double vision.  EARS, NOSE, AND THROAT: No tinnitus or ear pain.  RESPIRATORY: No cough, shortness of breath, wheezing or hemoptysis.  CARDIOVASCULAR: No chest pain, orthopnea, edema.  GASTROINTESTINAL: No nausea, vomiting, diarrhea or abdominal pain.  GENITOURINARY: No dysuria, hematuria.  ENDOCRINE: No polyuria, nocturia,  HEMATOLOGY: No anemia, easy bruising or bleeding SKIN: No rash or lesion. MUSCULOSKELETAL: No joint pain or arthritis.   NEUROLOGIC: No tingling, numbness, weakness.  PSYCHIATRY: No anxiety or depression.   DRUG ALLERGIES:  No Known Allergies  VITALS:  Blood pressure (!) 166/106, pulse 89, temperature 97.7 F (36.5 C), temperature source Oral, resp. rate 18, height 6' (1.829 m), weight 117.2 kg (258 lb 6.4 oz), SpO2 96 %.  PHYSICAL EXAMINATION:  GENERAL:  65 y.o.-year-old patient lying in the bed with no acute distress.  EYES: Pupils equal, round, reactive to light and accommodation. No scleral icterus. Extraocular muscles intact.  HEENT: Head atraumatic, normocephalic. Oropharynx and nasopharynx clear.  NECK:  Supple, no jugular venous distention. No thyroid enlargement, no tenderness.  LUNGS: Normal breath sounds bilaterally, no wheezing, rales,rhonchi or crepitation. No use of accessory muscles of respiration.  CARDIOVASCULAR: S1, S2 normal. No murmurs, rubs, or gallops.  ABDOMEN: Soft, nontender, nondistended. Bowel sounds present. No organomegaly or mass.  EXTREMITIES: No pedal edema, cyanosis, or clubbing.  NEUROLOGIC: Cranial nerves II through XII are intact. Muscle  strength 5/5 in all extremities. Sensation intact. Gait not checked.  PSYCHIATRIC: The patient is alert and oriented x 3.  SKIN: No obvious rash, lesion, or ulcer.    LABORATORY PANEL:   CBC  Recent Labs Lab 07/22/16 0418  WBC 8.8  HGB 14.0  HCT 41.3  PLT 232   ------------------------------------------------------------------------------------------------------------------  Chemistries   Recent Labs Lab 07/20/16 2234  07/22/16 1235  NA 133*  < > 135  K 5.1  < > 4.4  CL 103  < > 106  CO2 22  < > 24  GLUCOSE 595*  < > 234*  BUN 40*  < > 22*  CREATININE 1.54*  < > 1.27*  CALCIUM 8.9  < > 8.4*  AST 23  --   --   ALT 21  --   --   ALKPHOS 95  --   --   BILITOT 0.4  --   --   < > = values in this interval not displayed. ------------------------------------------------------------------------------------------------------------------  Cardiac Enzymes  Recent Labs Lab 07/21/16 1923  TROPONINI 0.09*   ------------------------------------------------------------------------------------------------------------------  RADIOLOGY:  Dg Chest 2 View  Result Date: 07/20/2016 CLINICAL DATA:  Initial evaluation for acute chest pain. EXAM: CHEST  2 VIEW COMPARISON:  Prior radiograph from 05/15/2015. FINDINGS: Cardiac and mediastinal silhouettes are stable in size and contour, and remain within normal limits. Lungs are mildly hyperinflated with changes compatible with emphysema. Right perihilar scarring. No focal infiltrates. No pulmonary edema or pleural effusion. No pneumothorax. No acute osseous abnormality. IMPRESSION: 1. No active cardiopulmonary disease. 2. Emphysema with right perihilar scarring, similar to previous. Electronically Signed   By: Rise MuBenjamin  McClintock M.D.   On: 07/20/2016 23:01    EKG:  Orders placed or performed during the hospital encounter of 07/20/16  . EKG 12-Lead  . EKG 12-Lead    ASSESSMENT AND PLAN:   #Unstable angina (HCC) - troponins  0.03-0.07-0.12 patient has known CAD with prior stent placement and presents  with an episode of prolonged central chest pressure and mildly positive troponin.  Chest pain currently resolved.  Echocardiogram is pending on heparin drip Discussed with cardiology- for  cardiac catheterization on Monday Continue aspirin 81 mg, Plavix, Coreg and statin   #  Diabetes mellitus type 2, uncontrolled, with complications (HCC) - glucose significantly elevated, patient will get IV fluids and then insulin to bring his glucose down to a more acceptable level.   # HYPERTENSION, essential- continue Coreg   # CAD (coronary artery disease) -  continue aspirin, Plavix, Coreg and statin     #COPD (chronic obstructive pulmonary disease) (HCC) - continue home inhalers    # Hyperlipidemia - home dose statin     #GERD (gastroesophageal reflux disease) -  PPI     All the records are reviewed and case discussed with Care Management/Social Workerr. Management plans discussed with the patient, family and they are in agreement.  CODE STATUS: Full code  TOTAL TIME TAKING CARE OF THIS PATIENT: 33 minutes.   POSSIBLE D/C IN 2 DAYS, DEPENDING ON CLINICAL CONDITION.  Note: This dictation was prepared with Dragon dictation along with smaller phrase technology. Any transcriptional errors that result from this process are unintentional.   Ramonita LabGouru, Malyssa Maris M.D on 07/22/2016 at 3:34 PM  Between 7am to 6pm - Pager - [phone] After 6pm go to [email or web address] - password [email or web address] [facility]  Office  [phone]  CC: Primary care physician; Evelene CroonNIEMEYER, MEINDERT, MD

## 2016-07-22 NOTE — Progress Notes (Signed)
ANTICOAGULATION CONSULT NOTE - Initial Consult  Pharmacy Consult for heparin bolus and drip Indication: chest pain/ACS  No Known Allergies  Patient Measurements: Height: 6' (182.9 cm) Weight: 258 lb 6.4 oz (117.2 kg) IBW/kg (Calculated) : 77.6 Heparin Dosing Weight: 103 kg  Vital Signs: Temp: 97.7 F (36.5 C) (12/17 0833) Temp Source: Oral (12/17 0833) BP: 166/106 (12/17 1205) Pulse Rate: 89 (12/17 1205)  Labs:  Recent Labs  07/20/16 2234  07/21/16 0730 07/21/16 1341 07/21/16 1923 07/22/16 0418 07/22/16 1008  HGB 15.1  --  13.8  --   --  14.0  --   HCT 45.9  --  41.5  --   --  41.3  --   PLT 230  --  220  --   --  232  --   APTT  --   --   --  133*  --   --   --   LABPROT  --   --   --  14.7  --   --   --   INR  --   --   --  1.14  --   --   --   HEPARINUNFRC  --   --   --   --  0.27* 0.38 0.37  CREATININE 1.54*  --  1.28*  --   --  1.21  --   TROPONINI 0.03*  < > 0.12* 0.12* 0.09*  --   --   < > = values in this interval not displayed.  Estimated Creatinine Clearance: 80.4 mL/min (by C-G formula based on SCr of 1.21 mg/dL).   Medical History: Past Medical History:  Diagnosis Date  . CAD (coronary artery disease)   . COPD (chronic obstructive pulmonary disease) (HCC)   . DM2 (diabetes mellitus, type 2) (HCC)   . HLD (hyperlipidemia)   . HTN (hypertension)   . Non-Q wave myocardial infarction (HCC)    mild    Medications:  Scheduled:  . aspirin EC  81 mg Oral Daily  . carvedilol  25 mg Oral BID WC  . clopidogrel  75 mg Oral Daily  . insulin aspart  0-9 Units Subcutaneous Q6H  . isosorbide mononitrate  30 mg Oral BID  . lisinopril  5 mg Oral Daily  . pantoprazole  40 mg Oral Daily  . simvastatin  40 mg Oral q1800   Infusions:  . heparin 1,500 Units/hr (07/22/16 0354)    Assessment: Pharmacy has been consulted to dose heparin bolus and drip in this 65 yoM with increasing troponin. Per PTA medication list and Dr. Amado CoeGouru, patient has not been taking  oral anticoagulant. Baseline labs have been ordered.  Goal of Therapy:  Heparin level 0.3-0.7 units/ml Monitor platelets by anticoagulation protocol: Yes   Plan:  12/16 1930: HL: 0.27   Heparin level is subtherapeutic. Will order heparin 1500 unit bolus and increase infusion to Heparin 1500units/hr. Will recheck heparin level in 6 hours.   12/17: HL @ 0400 = 0.38 Will continue this pt on current rate of 1500 units/hr and recheck HL in 6 hrs on 12/17 @ 10:00.   12/17 Confirmation heparin level resulted @ 0.37. Will recheck heparin level and CBC in am.    Demetrius CharityJames,Rick Warnick D, PharmD Clinical Pharmacist 07/22/2016 12:09 PM

## 2016-07-22 NOTE — Progress Notes (Signed)
Pt's, CBG is 67, pt. Is asymptomatic, A&Ox4, given orange juice, graham cracker and peanut butter. Hypoglycemic protocol started.

## 2016-07-22 NOTE — Progress Notes (Signed)
CBG rechecked came up to 78, pt. Continues to be asystematic. Will continue to monitor pt.

## 2016-07-22 NOTE — Progress Notes (Signed)
Pt. Walked around nurses station multiple times throughout the night. Heparin gtt. Continues. C/o pain x1, medication given, with effective. Pt. Catnapped throughout the night, stated this is the way he sleeps at home.

## 2016-07-22 NOTE — Progress Notes (Signed)
ANTICOAGULATION CONSULT NOTE - Initial Consult  Pharmacy Consult for heparin bolus and drip Indication: chest pain/ACS  No Known Allergies  Patient Measurements: Height: 6' (182.9 cm) Weight: 258 lb 6.4 oz (117.2 kg) IBW/kg (Calculated) : 77.6 Heparin Dosing Weight: 103 kg  Vital Signs: Temp: 98.2 F (36.8 C) (12/17 0347) Temp Source: Oral (12/17 0347) BP: 154/95 (12/17 0347) Pulse Rate: 90 (12/17 0347)  Labs:  Recent Labs  07/20/16 2234  07/21/16 0730 07/21/16 1341 07/21/16 1923 07/22/16 0418  HGB 15.1  --  13.8  --   --  14.0  HCT 45.9  --  41.5  --   --  41.3  PLT 230  --  220  --   --  232  APTT  --   --   --  133*  --   --   LABPROT  --   --   --  14.7  --   --   INR  --   --   --  1.14  --   --   HEPARINUNFRC  --   --   --   --  0.27* 0.38  CREATININE 1.54*  --  1.28*  --   --   --   TROPONINI 0.03*  < > 0.12* 0.12* 0.09*  --   < > = values in this interval not displayed.  Estimated Creatinine Clearance: 76 mL/min (by C-G formula based on SCr of 1.28 mg/dL (H)).   Medical History: Past Medical History:  Diagnosis Date  . CAD (coronary artery disease)   . COPD (chronic obstructive pulmonary disease) (HCC)   . DM2 (diabetes mellitus, type 2) (HCC)   . HLD (hyperlipidemia)   . HTN (hypertension)   . Non-Q wave myocardial infarction (HCC)    mild    Medications:  Scheduled:  . aspirin EC  81 mg Oral Daily  . carvedilol  25 mg Oral BID WC  . clopidogrel  75 mg Oral Daily  . insulin aspart  0-9 Units Subcutaneous Q6H  . isosorbide mononitrate  30 mg Oral BID  . pantoprazole  40 mg Oral Daily  . simvastatin  40 mg Oral q1800   Infusions:  . heparin 1,500 Units/hr (07/22/16 0354)    Assessment: Pharmacy has been consulted to dose heparin bolus and drip in this 65 yoM with increasing troponin. Per PTA medication list and Dr. Amado CoeGouru, patient has not been taking oral anticoagulant. Baseline labs have been ordered.  Goal of Therapy:  Heparin level  0.3-0.7 units/ml Monitor platelets by anticoagulation protocol: Yes   Plan:  12/16 1930: HL: 0.27   Heparin level is subtherapeutic. Will order heparin 1500 unit bolus and increase infusion to Heparin 1500units/hr. Will recheck heparin level in 6 hours.   12/17: HL @ 0400 = 0.38 Will continue this pt on current rate of 1500 units/hr and recheck HL in 6 hrs on 12/17 @ 10:00.   Scherrie Gerlachobbins,Darrian Grzelak D, PharmD Clinical Pharmacist 07/22/2016 5:29 AM

## 2016-07-22 NOTE — Progress Notes (Signed)
Pt. Continues to refuse bed alarm. 

## 2016-07-23 ENCOUNTER — Encounter: Payer: Self-pay | Admitting: Cardiovascular Disease

## 2016-07-23 ENCOUNTER — Encounter
Admission: EM | Disposition: A | Discharge status: Home / Self Care | Payer: Self-pay | Source: Home / Self Care | Attending: Internal Medicine

## 2016-07-23 DIAGNOSIS — I25110 Atherosclerotic heart disease of native coronary artery with unstable angina pectoris: Secondary | ICD-10-CM

## 2016-07-23 HISTORY — PX: CARDIAC CATHETERIZATION: SHX172

## 2016-07-23 LAB — CBC
HCT: 41.5 % (ref 40.0–52.0)
HCT: 45 % (ref 40.0–52.0)
Hemoglobin: 13.8 g/dL (ref 13.0–18.0)
Hemoglobin: 15.2 g/dL (ref 13.0–18.0)
MCH: 30.2 pg (ref 26.0–34.0)
MCH: 30.8 pg (ref 26.0–34.0)
MCHC: 33.2 g/dL (ref 32.0–36.0)
MCHC: 33.7 g/dL (ref 32.0–36.0)
MCV: 90.8 fL (ref 80.0–100.0)
MCV: 91.4 fL (ref 80.0–100.0)
Platelets: 223 10*3/uL (ref 150–440)
Platelets: 242 10*3/uL (ref 150–440)
RBC: 4.57 MIL/uL (ref 4.40–5.90)
RBC: 4.92 MIL/uL (ref 4.40–5.90)
RDW: 12.6 % (ref 11.5–14.5)
RDW: 13.1 % (ref 11.5–14.5)
WBC: 6.5 10*3/uL (ref 3.8–10.6)
WBC: 7.7 10*3/uL (ref 3.8–10.6)

## 2016-07-23 LAB — GLUCOSE, CAPILLARY
Glucose-Capillary: 138 mg/dL — ABNORMAL HIGH (ref 65–99)
Glucose-Capillary: 123 mg/dL — ABNORMAL HIGH (ref 65–99)
Glucose-Capillary: 136 mg/dL — ABNORMAL HIGH (ref 65–99)
Glucose-Capillary: 140 mg/dL — ABNORMAL HIGH (ref 65–99)

## 2016-07-23 LAB — BASIC METABOLIC PANEL
Anion gap: 4 — ABNORMAL LOW (ref 5–15)
BUN: 19 mg/dL (ref 6–20)
CO2: 24 mmol/L (ref 22–32)
Calcium: 8.6 mg/dL — ABNORMAL LOW (ref 8.9–10.3)
Chloride: 110 mmol/L (ref 101–111)
Creatinine, Ser: 1.19 mg/dL (ref 0.61–1.24)
GFR calc Af Amer: >60 mL/min (ref >60)
GFR calc non Af Amer: >60 mL/min (ref >60)
Glucose, Bld: 148 mg/dL — ABNORMAL HIGH (ref 65–99)
Potassium: 4.1 mmol/L (ref 3.5–5.1)
Sodium: 138 mmol/L (ref 135–145)

## 2016-07-23 LAB — HEMOGLOBIN A1C
Hgb A1c MFr Bld: 12.5 % — ABNORMAL HIGH (ref 4.8–5.6)
Mean Plasma Glucose: 312 mg/dL

## 2016-07-23 LAB — CREATININE, SERUM
Creatinine, Ser: 1.27 mg/dL — ABNORMAL HIGH (ref 0.61–1.24)
GFR calc Af Amer: >60 mL/min (ref >60)
GFR calc non Af Amer: 58 mL/min — ABNORMAL LOW (ref >60)

## 2016-07-23 LAB — HEPARIN LEVEL (UNFRACTIONATED): Heparin Unfractionated: 0.26 IU/mL — ABNORMAL LOW (ref 0.30–0.70)

## 2016-07-23 SURGERY — LEFT HEART CATH AND CORONARY ANGIOGRAPHY
Anesthesia: Moderate Sedation

## 2016-07-23 MED ORDER — ASPIRIN 81 MG PO CHEW
CHEWABLE_TABLET | ORAL | Status: AC
  Filled 2016-07-23: qty 1

## 2016-07-23 MED ORDER — SODIUM CHLORIDE 0.9% FLUSH: 3.0000 mL | INTRAVENOUS | Status: DC | PRN

## 2016-07-23 MED ORDER — SODIUM CHLORIDE 0.9 % WEIGHT BASED INFUSION
3.0000 mL/kg/h | INTRAVENOUS | Status: DC
  Administered 2016-07-23: 3 mL/kg/h via INTRAVENOUS

## 2016-07-23 MED ORDER — SODIUM CHLORIDE 0.9% FLUSH
3.0000 mL | Freq: Two times a day (BID) | INTRAVENOUS | Status: DC
  Administered 2016-07-23 (×2): 3 mL via INTRAVENOUS

## 2016-07-23 MED ORDER — ACETAMINOPHEN 325 MG PO TABS: 650.0000 mg | ORAL_TABLET | Freq: Four times a day (QID) | ORAL | Status: DC | PRN

## 2016-07-23 MED ORDER — ASPIRIN 81 MG PO CHEW
81.0000 mg | CHEWABLE_TABLET | ORAL | Status: AC
  Administered 2016-07-23: 81 mg via ORAL

## 2016-07-23 MED ORDER — FUROSEMIDE 40 MG PO TABS: 40.0000 mg | ORAL_TABLET | Freq: Every day | ORAL | 0 refills | Status: DC

## 2016-07-23 MED ORDER — MIDAZOLAM HCL 2 MG/2ML IJ SOLN
INTRAMUSCULAR | Status: AC
  Filled 2016-07-23: qty 2

## 2016-07-23 MED ORDER — SODIUM CHLORIDE 0.9 % WEIGHT BASED INFUSION
1.0000 mL/kg/h | INTRAVENOUS | Status: DC
  Administered 2016-07-23: 1 mL/kg/h via INTRAVENOUS

## 2016-07-23 MED ORDER — SODIUM CHLORIDE 0.9% FLUSH: 3.0000 mL | Freq: Two times a day (BID) | INTRAVENOUS | Status: DC

## 2016-07-23 MED ORDER — FUROSEMIDE 40 MG PO TABS: 40.0000 mg | ORAL_TABLET | Freq: Two times a day (BID) | ORAL | Status: DC

## 2016-07-23 MED ORDER — MIDAZOLAM HCL 2 MG/2ML IJ SOLN
INTRAMUSCULAR | Status: DC | PRN
  Administered 2016-07-23: 1 mg via INTRAVENOUS

## 2016-07-23 MED ORDER — LISINOPRIL 10 MG PO TABS: 10.0000 mg | ORAL_TABLET | Freq: Every day | ORAL | Status: DC

## 2016-07-23 MED ORDER — SODIUM CHLORIDE 0.9 % IV SOLN: 250.0000 mL | INTRAVENOUS | Status: DC | PRN

## 2016-07-23 MED ORDER — VERAPAMIL HCL 2.5 MG/ML IV SOLN
INTRAVENOUS | Status: AC
  Filled 2016-07-23: qty 2

## 2016-07-23 MED ORDER — HEPARIN (PORCINE) IN NACL 2-0.9 UNIT/ML-% IJ SOLN
INTRAMUSCULAR | Status: AC
  Filled 2016-07-23: qty 500

## 2016-07-23 MED ORDER — HEPARIN BOLUS VIA INFUSION
1500.0000 [IU] | Freq: Once | INTRAVENOUS | Status: AC
  Administered 2016-07-23: 1500 [IU] via INTRAVENOUS
  Filled 2016-07-23: qty 1500

## 2016-07-23 MED ORDER — ASPIRIN 81 MG PO CHEW: 81.0000 mg | CHEWABLE_TABLET | Freq: Once | ORAL | Status: DC

## 2016-07-23 MED ORDER — FENTANYL CITRATE (PF) 100 MCG/2ML IJ SOLN
INTRAMUSCULAR | Status: AC
  Filled 2016-07-23: qty 2

## 2016-07-23 MED ORDER — LISINOPRIL 10 MG PO TABS: 10.0000 mg | ORAL_TABLET | Freq: Every day | ORAL | 0 refills | Status: DC

## 2016-07-23 MED ORDER — HEPARIN SODIUM (PORCINE) 1000 UNIT/ML IJ SOLN
INTRAMUSCULAR | Status: AC
  Filled 2016-07-23: qty 1

## 2016-07-23 MED ORDER — INSULIN GLARGINE 100 UNIT/ML ~~LOC~~ SOLN: 50.0000 [IU] | Freq: Two times a day (BID) | SUBCUTANEOUS | 11 refills | Status: DC

## 2016-07-23 MED ORDER — METOPROLOL TARTRATE 5 MG/5ML IV SOLN
5.0000 mg | Freq: Four times a day (QID) | INTRAVENOUS | Status: DC | PRN
  Administered 2016-07-23: 5 mg via INTRAVENOUS
  Filled 2016-07-23: qty 5

## 2016-07-23 MED ORDER — FENTANYL CITRATE (PF) 100 MCG/2ML IJ SOLN
INTRAMUSCULAR | Status: DC | PRN
  Administered 2016-07-23: 50 ug via INTRAVENOUS

## 2016-07-23 MED ORDER — IOPAMIDOL (ISOVUE-300) INJECTION 61%
INTRAVENOUS | Status: DC | PRN
  Administered 2016-07-23: 100 mL via INTRA_ARTERIAL

## 2016-07-23 MED ORDER — ENOXAPARIN SODIUM 40 MG/0.4ML ~~LOC~~ SOLN: 40.0000 mg | SUBCUTANEOUS | Status: DC

## 2016-07-23 SURGICAL SUPPLY — 11 items
CATH 5FR PIGTAIL DIAGNOSTIC (CATHETERS) ×2 IMPLANT
CATH INFINITI 5FR JL4 (CATHETERS) ×2 IMPLANT
CATH INFINITI JR4 5F (CATHETERS) ×2 IMPLANT
DEVICE CLOSURE MYNXGRIP 5F (Vascular Products) ×2 IMPLANT
GLIDESHEATH SLEND SS 6F .021 (SHEATH) IMPLANT
KIT MANI 3VAL PERCEP (MISCELLANEOUS) ×2 IMPLANT
NEEDLE PERC 18GX7CM (NEEDLE) ×2 IMPLANT
PACK CARDIAC CATH (CUSTOM PROCEDURE TRAY) ×2 IMPLANT
SHEATH AVANTI 5FR X 11CM (SHEATH) ×2 IMPLANT
WIRE EMERALD 3MM-J .035X150CM (WIRE) ×2 IMPLANT
WIRE ROSEN-J .035X260CM (WIRE) IMPLANT

## 2016-07-23 NOTE — Interval H&P Note (Signed)
History and Physical Interval Note:  07/23/2016 Cath Lab Visit (complete for each Cath Lab visit)  Clinical Evaluation Leading to the Procedure:   ACS: Yes.    Non-ACS:    Anginal Classification: CCS IV  Anti-ischemic medical therapy: Minimal Therapy (1 class of medications)  Non-Invasive Test Results: No non-invasive testing performed  Prior CABG: No previous CABG       10:31 AM  Danny Friendsobert M Manfredo Sr.  has presented today for surgery, with the diagnosis of NSTEMI  The various methods of treatment have been discussed with the patient and family. After consideration of risks, benefits and other options for treatment, the patient has consented to  Procedure(s): Left Heart Cath and Coronary Angiography (N/A) as a surgical intervention .  The patient's history has been reviewed, patient examined, no change in status, stable for surgery.  I have reviewed the patient's chart and labs.  Questions were answered to the patient's satisfaction.     Lorine BearsMuhammad Arida

## 2016-07-23 NOTE — Progress Notes (Signed)
Report to abigail telemetry.  Check right groin for bleeding or hematoma.  Patient will be on bedrest for 1 hours post sheath pull---out of bed at 12:00.  Bilateral pulses are doppler  PT's..[name]

## 2016-07-23 NOTE — Progress Notes (Signed)
Patient is discharge home in a stable condition , cath site good , no hematoma  Noted , summary and f/y care given , verbalized understanding , left with son

## 2016-07-23 NOTE — Discharge Instructions (Signed)
Follow-up with Dr. Kirke CorinArida in 1-2 weeks Outpatient follow-up with [email or web address]-dr.[email or web address] - sleep study Follow-up with primary care physician in a week

## 2016-07-23 NOTE — Progress Notes (Signed)
ANTICOAGULATION CONSULT NOTE - Initial Consult  Pharmacy Consult for heparin bolus and drip Indication: chest pain/ACS  No Known Allergies  Patient Measurements: Height: 6' (182.9 cm) Weight: 253 lb 11.2 oz (115.1 kg) IBW/kg (Calculated) : 77.6 Heparin Dosing Weight: 103 kg  Vital Signs: Temp: 98.2 F (36.8 C) (12/18 0337) Temp Source: Oral (12/18 0337) BP: 139/74 (12/18 0337) Pulse Rate: 84 (12/18 0337)  Labs:  Recent Labs  07/21/16 0730 07/21/16 1341  07/21/16 1923 07/22/16 0418 07/22/16 1008 07/22/16 1235 07/23/16 0455  HGB 13.8  --   --   --  14.0  --   --  13.8  HCT 41.5  --   --   --  41.3  --   --  41.5  PLT 220  --   --   --  232  --   --  223  APTT  --  133*  --   --   --   --   --   --   LABPROT  --  14.7  --   --   --   --   --   --   INR  --  1.14  --   --   --   --   --   --   HEPARINUNFRC  --   --   < > 0.27* 0.38 0.37  --  0.26*  CREATININE 1.28*  --   --   --  1.21  --  1.27* 1.19  TROPONINI 0.12* 0.12*  --  0.09*  --   --   --   --   < > = values in this interval not displayed.  Estimated Creatinine Clearance: 81.1 mL/min (by C-G formula based on SCr of 1.19 mg/dL).   Medical History: Past Medical History:  Diagnosis Date  . CAD (coronary artery disease)   . COPD (chronic obstructive pulmonary disease) (HCC)   . DM2 (diabetes mellitus, type 2) (HCC)   . HLD (hyperlipidemia)   . HTN (hypertension)   . Non-Q wave myocardial infarction (HCC)    mild    Medications:  Scheduled:  . aspirin      . aspirin EC  81 mg Oral Daily  . carvedilol  25 mg Oral BID WC  . clopidogrel  75 mg Oral Daily  . heparin  1,500 Units Intravenous Once  . insulin aspart  0-9 Units Subcutaneous Q6H  . isosorbide mononitrate  30 mg Oral BID  . lisinopril  5 mg Oral Daily  . pantoprazole  40 mg Oral Daily  . simvastatin  40 mg Oral q1800  . sodium chloride flush  3 mL Intravenous Q12H   Infusions:  . [START ON 07/24/2016] sodium chloride 3 mL/kg/hr (07/23/16  0538)   Followed by  . [START ON 07/24/2016] sodium chloride    . heparin 1,500 Units/hr (07/22/16 2139)    Assessment: Pharmacy has been consulted to dose heparin bolus and drip in this 65 yoM with increasing troponin. Per PTA medication list and Dr. Amado CoeGouru, patient has not been taking oral anticoagulant. Baseline labs have been ordered.  Goal of Therapy:  Heparin level 0.3-0.7 units/ml Monitor platelets by anticoagulation protocol: Yes   Plan:  12/16 1930: HL: 0.27   Heparin level is subtherapeutic. Will order heparin 1500 unit bolus and increase infusion to Heparin 1500units/hr. Will recheck heparin level in 6 hours.   12/17: HL @ 0400 = 0.38 Will continue this pt on current rate of 1500 units/hr and recheck  HL in 6 hrs on 12/17 @ 10:00.   12/17 Confirmation heparin level resulted @ 0.37. Will recheck heparin level and CBC in am.   12/18:  HL @ 0500 = 0.26. Will order Heparin 1500 units IV X 1 bolus and increase drip rate to 1700 units/hr.  Will recheck HL 6 hrs after rate change.    Scherrie Gerlachobbins,Liliya Fullenwider D, PharmD Clinical Pharmacist 07/23/2016 6:06 AM

## 2016-07-23 NOTE — Progress Notes (Signed)
Dr Amado CoeGouru was informed  About pt high bp , no new order at this time , will continue to monitor

## 2016-07-23 NOTE — H&P (View-Only) (Signed)
Patient ID: Danny Scrapeobert M Ezzell Sr., male   DOB: Sep 28, 1950, 65 y.o.   MRN: [id number]   SUBJECTIVE: No further chest pain.  No complaints except for insomnia. BP now running high.   Scheduled Meds: . aspirin EC  81 mg Oral Daily  . carvedilol  25 mg Oral BID WC  . clopidogrel  75 mg Oral Daily  . insulin aspart  0-9 Units Subcutaneous Q6H  . isosorbide mononitrate  30 mg Oral BID  . lisinopril  5 mg Oral Daily  . pantoprazole  40 mg Oral Daily  . simvastatin  40 mg Oral q1800   Continuous Infusions: . heparin 1,500 Units/hr (07/22/16 0354)   PRN Meds:.acetaminophen **OR** acetaminophen, albuterol, alum & mag hydroxide-simeth, cyclobenzaprine, nitroGLYCERIN, ondansetron **OR** ondansetron (ZOFRAN) IV, oxyCODONE, traMADol     Vitals:   07/21/16 1917 07/21/16 2150 07/22/16 0347 07/22/16 0833  BP: (!) 154/105 135/68 (!) 154/95 (!) 175/102  Pulse: 90 84 90 87  Resp: 18  18 18   Temp: 98 F (36.7 C)  98.2 F (36.8 C) 97.7 F (36.5 C)  TempSrc: Oral  Oral Oral  SpO2: 98%  93% 93%  Weight:      Height:        Intake/Output Summary (Last 24 hours) at 07/22/16 1141 Last data filed at 07/22/16 0947  Gross per 24 hour  Intake              872 ml  Output              350 ml  Net              522 ml    LABS: Basic Metabolic Panel:  Recent Labs  78/29/5612/16/17 0730 07/22/16 0418  NA 139 139  K 4.5 4.2  CL 111 110  CO2 25 24  GLUCOSE 122* 87  BUN 34* 23*  CREATININE 1.28* 1.21  CALCIUM 8.3* 8.7*   Liver Function Tests:  Recent Labs  07/20/16 2234  AST 23  ALT 21  ALKPHOS 95  BILITOT 0.4  PROT 7.0  ALBUMIN 3.9    Recent Labs  07/20/16 2234  LIPASE 52*   CBC:  Recent Labs  07/21/16 0730 07/22/16 0418  WBC 7.0 8.8  HGB 13.8 14.0  HCT 41.5 41.3  MCV 92.7 91.0  PLT 220 232   Cardiac Enzymes:  Recent Labs  07/21/16 0730 07/21/16 1341 07/21/16 1923  TROPONINI 0.12* 0.12* 0.09*   BNP: Invalid input(s): POCBNP D-Dimer: No results for input(s): DDIMER  in the last 72 hours. Hemoglobin A1C: No results for input(s): HGBA1C in the last 72 hours. Fasting Lipid Panel:  Recent Labs  07/22/16 0418  CHOL 109  HDL 42  LDLCALC 44  TRIG 116  CHOLHDL 2.6   Thyroid Function Tests: No results for input(s): TSH, T4TOTAL, T3FREE, THYROIDAB in the last 72 hours.  Invalid input(s): FREET3 Anemia Panel: No results for input(s): VITAMINB12, FOLATE, FERRITIN, TIBC, IRON, RETICCTPCT in the last 72 hours.  RADIOLOGY: Dg Chest 2 View  Result Date: 07/20/2016 CLINICAL DATA:  Initial evaluation for acute chest pain. EXAM: CHEST  2 VIEW COMPARISON:  Prior radiograph from 05/15/2015. FINDINGS: Cardiac and mediastinal silhouettes are stable in size and contour, and remain within normal limits. Lungs are mildly hyperinflated with changes compatible with emphysema. Right perihilar scarring. No focal infiltrates. No pulmonary edema or pleural effusion. No pneumothorax. No acute osseous abnormality. IMPRESSION: 1. No active cardiopulmonary disease. 2. Emphysema with right perihilar scarring, similar to  previous. Electronically Signed   By: Rise MuBenjamin  McClintock M.D.   On: 07/20/2016 23:01    PHYSICAL EXAM General: NAD Neck: No JVD, no thyromegaly or thyroid nodule.  Lungs: Clear to auscultation bilaterally with normal respiratory effort. CV: Nondisplaced PMI.  Heart regular S1/S2, no S3/S4, no murmur.  Trac ankle edema.  No carotid bruit.  Normal pedal pulses.  Abdomen: Soft, nontender, no hepatosplenomegaly, no distention.  Neurologic: Alert and oriented x 3.  Psych: Normal affect. Extremities: No clubbing or cyanosis.   TELEMETRY: Reviewed telemetry pt in NSR  ASSESSMENT AND PLAN: 65 yo with history of CAD, COPD, CKD, and diabetes presented with chest pain. 1. CAD: NSTEMI with troponin peak 0.12 and nonspecific T wave changes.  He has history of CAD with prior LAD intervention in 6/11.  Currently chest pain-free.  - Continue heparin gtt - Continue ASA  81, Plavix.  - Continue Coreg and Imdur at current doses.   - Good LDL on current statin.   - Plan for cath tomorrow, orders put in and procedure discussed with patient.  He understands risks/benefits and agrees to proceed. 2. CKD: Stage III, suspect diabetic nephropathy. Creatinine improved. Will get pre-cath hydration.  3. Ischemic cardiomyopathy: No recent echo, but EF 40% reported from some time in the past.   - Echocardiogram.  - Continue Coreg.  - With high BP and better creatinine, will add lisinopril 5 mg daily.  4. COPD: No longer smoking.   Marca AnconaDalton Pankaj Haack 07/22/2016 11:44 AM

## 2016-07-23 NOTE — Progress Notes (Addendum)
Inpatient Diabetes Program Recommendations  AACE/ADA: New Consensus Statement on Inpatient Glycemic Control (2015)  Target Ranges:  Prepandial:   less than 140 mg/dL      Peak postprandial:   less than 180 mg/dL (1-2 hours)      Critically ill patients:  140 - 180 mg/dL   Lab Results  Component Value Date   GLUCAP 140 (H) 07/23/2016   HGBA1C 12.5 (H) 07/21/2016    Review of Glycemic Control:  Results for Danny [name], Danny FriendsROBERT M [name]. (MRN [id number]) as of 07/23/2016 14:24  Ref. Range 07/22/2016 21:46 07/23/2016 05:55 07/23/2016 07:36 07/23/2016 10:20 07/23/2016 12:02  Glucose-Capillary Latest Ref Range: 65 - 99 mg/dL 409256 (H) 811138 (H) 914123 (H) 136 (H) 140 (H)   Diabetes history: Type 2 diabetes Outpatient Diabetes medications: Lantus 120 units bid, Tradjenta 5 mg daily, Jardiance 25 mg daily Current orders for Inpatient glycemic control:  Novolog sensitive q 6 hours  Inpatient Diabetes Program Recommendations:    Spoke with patient regarding home basal insulin dose.  He states that he gives himself Lantus 60 units x 2- twice daily which totals 240 units daily. Patient states that he doesn't think he needs that much insulin?  He is interested in seeing a diabetes specialist.  Will place order per protocol for outpatient diabetes education as he says he is interested in learning more and doing better with his lifestyle.  Blood sugars not bad with no basal ordered in the hospital.  Consider reducing Lantus at d/c to 50 units bid at discharge and have patient follow-up with PCP.   Explained to patient that A1C indicates poor control of diabetes prior to admit.   Thanks, Beryl MeagerJenny Edan Juday, RN, BC-ADM Inpatient Diabetes Coordinator Pager [phone]

## 2016-07-23 NOTE — Progress Notes (Signed)
Pt. Continues to refuse bed alarm. 

## 2016-07-23 NOTE — Discharge Summary (Signed)
[facility] at [facility]   PATIENT NAME: Danny [name]    MR#:  [id number]  DATE OF BIRTH:  October 17, 1950  DATE OF ADMISSION:  07/20/2016 ADMITTING PHYSICIAN: Oralia Manisavid Willis, MD  DATE OF DISCHARGE: 07/23/16  PRIMARY CARE PHYSICIAN: Evelene CroonNIEMEYER, MEINDERT, MD    ADMISSION DIAGNOSIS:  Hyperglycemia [R73.9] Chest pain, unspecified type [R07.9]  DISCHARGE DIAGNOSIS:  Principal Problem:   Unstable angina (HCC) Active Problems:   Diabetes mellitus type 2, uncontrolled, with complications (HCC)   Hyperlipidemia   HYPERTENSION, BENIGN   CAD (coronary artery disease)   GERD (gastroesophageal reflux disease)   COPD (chronic obstructive pulmonary disease) (HCC)   NSTEMI (non-ST elevated myocardial infarction) (HCC)   AKI (acute kidney injury) (HCC)   SECONDARY DIAGNOSIS:   Past Medical History:  Diagnosis Date  . CAD (coronary artery disease)   . COPD (chronic obstructive pulmonary disease) (HCC)   . DM2 (diabetes mellitus, type 2) (HCC)   . HLD (hyperlipidemia)   . HTN (hypertension)   . Non-Q wave myocardial infarction [facility](HCC)    mild    HOSPITAL COURSE:   Danny [name]  is a 65 y.o. male who presents with Central chest pain. Patient states he was at home today when he began to have substernal chest pressure which radiated to his left arm. He took nitroglycerin with minimal relief, but then his pain recurred again within about 10 minutes. This cycle repeated about 4 times until he decided to come to the ED for evaluation. He does have a history significant for CAD with prior stent placement. Here in the ED his chest pain has currently resolved. Initial troponin was barely positive at 0.03. Hospitals were called for admission and further evaluation. Of note, his blood glucose was also noted to be almost 600, with normal anion gap.Please review H&P for details  Hospital course  #Unstable angina [facility](HCC) - troponins 0.03-0.07-0.12 patient has known CAD with  prior stent placement and presents  with an episode of prolonged central chest pressure and mildly positive troponin.  Chest pain currently resolved.  Echocardiogram 30% ejection fraction on heparin drip during the hospital course Discussed with cardiology-  cardiac catheterization done today which has revealed ejection fraction 25-30% Continue aspirin 81 mg, Plavix, Coreg and statin  Patient is started on Lasix 40 mg by mouth once daily and lisinopril dose increased to 10 mg by mouth once daily Outpatient sleep study is recommended Outpatient follow-up with the cardiology Dr. Kirke CorinArida in 1-2 weeks  #Diabetes mellitus type 2, uncontrolled, with complications (HCC) - glucose significantly elevated, patient givrn  IV fluids and  Insulin Diabetic coordinator has recommended to discharge patient with Lantus 50 units twice a day and at the time of discharge and outpatient follow-up with primary care physician  #HYPERTENSION, essential- continue Coreg and lisinopril  #CAD (coronary artery disease) -  continue aspirin, Plavix, Coreg and statin    #COPD (chronic obstructive pulmonary disease) (HCC) - continue home inhalers Outpatient sleep study follow-up with pulmonology   #Hyperlipidemia - home dose statin   #GERD (gastroesophageal reflux disease) -  PPI  DISCHARGE CONDITIONS:   fair  CONSULTS OBTAINED:  Treatment Team:  Laurey Moralealton S McLean, MD   PROCEDURES cardiac cath 07/23/16  DRUG ALLERGIES:  No Known Allergies  DISCHARGE MEDICATIONS:   Current Discharge Medication List    START taking these medications   Details  acetaminophen (TYLENOL) 325 MG tablet Take 2 tablets (650 mg total) by mouth every 6 (six) hours as needed  for mild pain (or Fever >/= 101).    furosemide (LASIX) 40 MG tablet Take 1 tablet (40 mg total) by mouth daily. Qty: 30 tablet, Refills: 0    lisinopril (PRINIVIL,ZESTRIL) 10 MG tablet Take 1 tablet (10 mg total) by mouth daily. Qty: 30 tablet,  Refills: 0      CONTINUE these medications which have CHANGED   Details  insulin glargine (LANTUS) 100 UNIT/ML injection Inject 0.5 mLs (50 Units total) into the skin 2 (two) times daily. Qty: 10 mL, Refills: 11      CONTINUE these medications which have NOT CHANGED   Details  albuterol (PROVENTIL) (2.5 MG/3ML) 0.083% nebulizer solution Take 2.5 mg by nebulization every 6 (six) hours as needed for wheezing or shortness of breath.    aspirin 81 MG tablet Take 81 mg by mouth daily.      carvedilol (COREG) 25 MG tablet Take 1 tablet (25 mg total) by mouth 2 (two) times daily with a meal. Qty: 60 tablet, Refills: 11    clopidogrel (PLAVIX) 75 MG tablet Take 1 tablet (75 mg total) by mouth daily. Qty: 30 tablet, Refills: 11    empagliflozin (JARDIANCE) 25 MG TABS tablet Take 25 mg by mouth daily.    isosorbide mononitrate (IMDUR) 30 MG 24 hr tablet Take 1 tablet (30 mg total) by mouth 2 (two) times daily. Qty: 60 tablet, Refills: 11    linagliptin (TRADJENTA) 5 MG TABS tablet Take 5 mg by mouth daily.    nitroGLYCERIN (NITROSTAT) 0.4 MG SL tablet Place 0.4 mg under the tongue every 5 (five) minutes as needed for chest pain.     simvastatin (ZOCOR) 40 MG tablet TAKE ONE TABLET BY MOUTH AT BEDTIME Qty: 30 tablet, Refills: 11      STOP taking these medications     cyclobenzaprine (FLEXERIL) 5 MG tablet      pantoprazole (PROTONIX) 40 MG tablet      traMADol (ULTRAM) 50 MG tablet          DISCHARGE INSTRUCTIONS:   Follow-up with Dr. Kirke Corin in 1-2 weeks Outpatient follow-up with pulmonology-dr.[email or web address] - sleep study Follow-up with primary care physician in a week   DIET:  Cardiac diet,Diabetic  DISCHARGE CONDITION:  Fair  ACTIVITY:  Activity as tolerated  OXYGEN:  Home Oxygen: No.   Oxygen Delivery: room air  DISCHARGE LOCATION:  home   If you experience worsening of your admission symptoms, develop shortness of breath, life threatening emergency, suicidal or  homicidal thoughts you must seek medical attention immediately by calling 911 or calling your MD immediately  if symptoms less severe.  You Must read complete instructions/literature along with all the possible adverse reactions/side effects for all the Medicines you take and that have been prescribed to you. Take any new Medicines after you have completely understood and accpet all the possible adverse reactions/side effects.   Please note  You were cared for by a hospitalist during your hospital stay. If you have any questions about your discharge medications or the care you received while you were in the hospital after you are discharged, you can call the unit and asked to speak with the hospitalist on call if the hospitalist that took care of you is not available. Once you are discharged, your primary care physician will handle any further medical issues. Please note that NO REFILLS for any discharge medications will be authorized once you are discharged, as it is imperative that you return to your primary care physician (or establish  a relationship with a primary care physician if you do not have one) for your aftercare needs so that they can reassess your need for medications and monitor your lab values.     Today  Chief Complaint  Patient presents with  . Chest Pain   Patient had cardiac catheterization today. Ejection fraction was noticed to be at 25-30%. Cardiology Dr. Kirke CorinArida has started him on Lasix 40 mg once daily and his lisinopril dose increased to 10 mg once daily  ROS:  CONSTITUTIONAL: Denies fevers, chills. Denies any fatigue, weakness.  EYES: Denies blurry vision, double vision, eye pain. EARS, NOSE, THROAT: Denies tinnitus, ear pain, hearing loss. RESPIRATORY: Denies cough, wheeze, shortness of breath.  CARDIOVASCULAR: Denies chest pain, palpitations, edema.  GASTROINTESTINAL: Denies nausea, vomiting, diarrhea, abdominal pain. Denies bright red blood per  rectum. GENITOURINARY: Denies dysuria, hematuria. ENDOCRINE: Denies nocturia or thyroid problems. HEMATOLOGIC AND LYMPHATIC: Denies easy bruising or bleeding. SKIN: Denies rash or lesion.  groin area with the cardiac catheter site-clear dressing MUSCULOSKELETAL: Denies pain in neck, back, shoulder, knees, hips or arthritic symptoms.  NEUROLOGIC: Denies paralysis, paresthesias.  PSYCHIATRIC: Denies anxiety or depressive symptoms.   VITAL SIGNS:  Blood pressure 123/68, pulse 85, temperature 97.7 F (36.5 C), temperature source Oral, resp. rate 16, height 6' (1.829 m), weight 115.1 kg (253 lb 11.2 oz), SpO2 96 %.  I/O:    Intake/Output Summary (Last 24 hours) at 07/23/16 1558 Last data filed at 07/23/16 1223  Gross per 24 hour  Intake          3531.26 ml  Output             2075 ml  Net          1456.26 ml    PHYSICAL EXAMINATION:  GENERAL:  65 y.o.-year-old patient lying in the bed with no acute distress.  EYES: Pupils equal, round, reactive to light and accommodation. No scleral icterus. Extraocular muscles intact.  HEENT: Head atraumatic, normocephalic. Oropharynx and nasopharynx clear.  NECK:  Supple, no jugular venous distention. No thyroid enlargement, no tenderness.  LUNGS: Normal breath sounds bilaterally, no wheezing, rales,rhonchi or crepitation. No use of accessory muscles of respiration.  CARDIOVASCULAR: S1, S2 normal. No murmurs, rubs, or gallops.  ABDOMEN: Soft, non-tender, non-distended. Bowel sounds present. No organomegaly or mass.  EXTREMITIES: No pedal edema, cyanosis, or clubbing.  NEUROLOGIC: Cranial nerves II through XII are intact. Muscle strength 5/5 in all extremities. Sensation intact. Gait not checked.  PSYCHIATRIC: The patient is alert and oriented x 3.  SKIN: No obvious rash, lesion, or ulcer.   DATA REVIEW:   CBC  Recent Labs Lab 07/23/16 1236  WBC 7.7  HGB 15.2  HCT 45.0  PLT 242    Chemistries   Recent Labs Lab 07/20/16 2234   07/23/16 0455 07/23/16 1236  NA 133*  < > 138  --   K 5.1  < > 4.1  --   CL 103  < > 110  --   CO2 22  < > 24  --   GLUCOSE 595*  < > 148*  --   BUN 40*  < > 19  --   CREATININE 1.54*  < > 1.19 1.27*  CALCIUM 8.9  < > 8.6*  --   AST 23  --   --   --   ALT 21  --   --   --   ALKPHOS 95  --   --   --   BILITOT  0.4  --   --   --   < > = values in this interval not displayed.  Cardiac Enzymes  Recent Labs Lab 07/21/16 1923  TROPONINI 0.09*    Microbiology Results  No results found for this or any previous visit.  RADIOLOGY:  Dg Chest 2 View  Result Date: 07/20/2016 CLINICAL DATA:  Initial evaluation for acute chest pain. EXAM: CHEST  2 VIEW COMPARISON:  Prior radiograph from 05/15/2015. FINDINGS: Cardiac and mediastinal silhouettes are stable in size and contour, and remain within normal limits. Lungs are mildly hyperinflated with changes compatible with emphysema. Right perihilar scarring. No focal infiltrates. No pulmonary edema or pleural effusion. No pneumothorax. No acute osseous abnormality. IMPRESSION: 1. No active cardiopulmonary disease. 2. Emphysema with right perihilar scarring, similar to previous. Electronically Signed   By: Rise Mu M.D.   On: 07/20/2016 23:01    EKG:   Orders placed or performed during the hospital encounter of 07/20/16  . EKG 12-Lead  . EKG 12-Lead      Management plans discussed with the patient, family and they are in agreement.  CODE STATUS:     Code Status Orders        Start     Ordered   07/21/16 0234  Full code  Continuous     07/21/16 0233    Code Status History    Date Active Date Inactive Code Status Order ID Comments User Context   This patient has a current code status but no historical code status.      TOTAL TIME TAKING CARE OF THIS PATIENT:  45  minutes.   Note: This dictation was prepared with Dragon dictation along with smaller phrase technology. Any transcriptional errors that result from this  process are unintentional.   @MEC @  on 07/23/2016 at 3:58 PM  Between 7am to 6pm - Pager - [phone]  After 6pm go to [email or web address] - password [email or web address] [facility]  [facility]  Office  [phone]  CC: Primary care physician; [name], MD

## 2016-08-08 DIAGNOSIS — E119 Type 2 diabetes mellitus without complications: Secondary | ICD-10-CM | POA: Diagnosis not present

## 2016-08-08 DIAGNOSIS — I10 Essential (primary) hypertension: Secondary | ICD-10-CM | POA: Diagnosis not present

## 2016-08-15 ENCOUNTER — Encounter: Payer: Self-pay | Admitting: Physician Assistant

## 2016-08-16 ENCOUNTER — Encounter: Payer: Self-pay | Admitting: Physician Assistant

## 2016-08-16 ENCOUNTER — Ambulatory Visit (INDEPENDENT_AMBULATORY_CARE_PROVIDER_SITE_OTHER): Payer: Medicare Other | Admitting: Physician Assistant

## 2016-08-16 VITALS — BP 126/88 | HR 95 | Ht 72.0 in | Wt 256.5 lb

## 2016-08-16 DIAGNOSIS — N183 Chronic kidney disease, stage 3 unspecified: Secondary | ICD-10-CM

## 2016-08-16 DIAGNOSIS — I255 Ischemic cardiomyopathy: Secondary | ICD-10-CM

## 2016-08-16 DIAGNOSIS — I10 Essential (primary) hypertension: Secondary | ICD-10-CM

## 2016-08-16 DIAGNOSIS — I5022 Chronic systolic (congestive) heart failure: Secondary | ICD-10-CM | POA: Diagnosis not present

## 2016-08-16 DIAGNOSIS — I214 Non-ST elevation (NSTEMI) myocardial infarction: Secondary | ICD-10-CM | POA: Diagnosis not present

## 2016-08-16 DIAGNOSIS — I2510 Atherosclerotic heart disease of native coronary artery without angina pectoris: Secondary | ICD-10-CM

## 2016-08-16 MED ORDER — ISOSORBIDE MONONITRATE ER 30 MG PO TB24: 30.0000 mg | ORAL_TABLET | Freq: Three times a day (TID) | ORAL | 3 refills | Status: DC

## 2016-08-16 MED ORDER — RANOLAZINE ER 500 MG PO TB12: 500.0000 mg | ORAL_TABLET | Freq: Two times a day (BID) | ORAL | 3 refills | Status: DC

## 2016-08-16 NOTE — Patient Instructions (Addendum)
Medication Instructions:  Your physician has recommended you make the following change in your medication:  1- INCREASE Imdur to 30 mg by mouth three times a day. 2- START Ranexa 500 mg (1 tablet) by mouth twice a day.   Labwork: none  Testing/Procedures: none  Follow-Up: Your physician recommends that you schedule a follow-up appointment in: 1 MONTH WITH DR Mariah MillingGOLLAN OR RYAN DUNN,PA.  If you need a refill on your cardiac medications before your next appointment, please call your pharmacy.  Samples of Ranexa were given to the patient, quantity 4 packets of 14 pills (48 tablets total) , Lot Number [id number], exp 04/2019.

## 2016-08-16 NOTE — Progress Notes (Signed)
Cardiology Office Note Date:  08/16/2016  Patient ID:  Danny ScrapeRobert M Morrisette Sr., DOB 20-Jul-1951, MRN [id number] PCP:  Evelene CroonNIEMEYER, MEINDERT, MD  Cardiologist:  Dr. Mariah MillingGollan, MD    Chief Complaint: Hospital follow up  History of Present Illness: Danny ScrapeRobert M Delmar Sr. is a 66 y.o. male with history of CAD s/p PCI to the LAD in 01/2010, ICM/chronic systolic CHF with patient reported prior EF of 40%, COPD 2/2 prior tobacco abuse, CKD stage III secondary to diabetic nephropathy, and DM who presents for hospital follow up after [facility] 12/18 for NSTEMI.   Prior to the above admission the patient had most recently undergone diagnostic LHC on 10/01/2014 for unstable angina that showed diffuse 90% ISR of the mid LAD stent. He underwent successful PCI/DES inside the previously placed stent leading to a jailed D2 by the stent. D2 was of medium size at 2 mm with significant ostial stenosis but normal flow. There was also tubular 80% stenosis of D1 and 80% stenosis of D2. Mid LCx with discrete 50% stenosis. Recent ED visit for chest pain in September 2017. Ruled out and was advised outpatient follow up.   He [facility] on 07/20/16 with reports of chest pain on and off for the prior several months that worsened the night prior to admission and was occuring at rest. There was no relief with SL NTG. Troponin peaked at 0.12. EKG showed NSR with PACs and nonspecific lateral T waves changes. LDL 44. A1C 12.5%. SCr 1.54 upon admission-->1.27 at discharge. Echo on 07/22/16 showed EF of 30% with a technically difficult study 2/2 poor acoustic windows and poor sound wave transmission. Given this Definity was administered, though still with poor images. There was septal and anterior severe hypokinesis. However, overall poor visualization of the myocardium even with Definity. GR1DD. Aortic valve was poorly visualized with incomplete interrogation. Mitral valve was poorly visualized. RV was poorly  visualized with normal systolic function. Unable to estimate PASP. He underwent LHC on 07/23/16 that showed no evidence of obstructive CAD affecting the main arteries. Patent mid LAD stent with mild ISR and moderate distal LAD disease. There was diffuse small vessel branch disease. Cardiac cath details: mid LAD 20%, distal LAD 40%, ostial D1 90%, ostial D2 90%, D1 2nd lesion 80%, OM1 90%, RPLB 90%. Moderately to severely reduced LV systolic function with an EF of 25-30% with global hypokinesis. Moderately elevated filling pressures. Compared to his most recent cardiac cath in 2016, the EF had decreased significantly. He was felt to be mixed ICM/NICM. Continued medical therapy was advised along with an outpatient sleep study.    He is doing well today. States he sometimes needs to take SL NTG 2-3 times weekly 2/2 chest pain associated with exertion. Usually 1 SL NTG will resolve his chest pain and then he is able to return to whatever he was doing when he developed the chest pain. He last took SL NTG the night before. Currently without chest pain. SOB stable. Weight stable. BP stable. Does not want to take Lasix any longer 2/2 frequent voiding. Wants Viagra.    Past Medical History:  Diagnosis Date  . CAD (coronary artery disease)   . COPD (chronic obstructive pulmonary disease) (HCC)   . DM2 (diabetes mellitus, type 2) (HCC)   . HLD (hyperlipidemia)   . HTN (hypertension)   . Ischemic cardiomyopathy   . NICM (nonischemic cardiomyopathy) [facility](HCC)     Past Surgical History:  Procedure Laterality Date  .  CAD: stent to the LAD    . CARDIAC CATHETERIZATION  10/01/2014  . CARDIAC CATHETERIZATION N/A 07/23/2016   Procedure: Left Heart Cath and Coronary Angiography;  Surgeon: Iran Ouch, MD;  Location: [facility] INVASIVE CV LAB;  Service: Cardiovascular;  Laterality: N/A;  . CORONARY ANGIOPLASTY WITH STENT PLACEMENT  10/01/2014    Current Outpatient Prescriptions  Medication Sig Dispense Refill  .  acetaminophen (TYLENOL) 325 MG tablet Take 2 tablets (650 mg total) by mouth every 6 (six) hours as needed for mild pain (or Fever >/= 101).    [name] albuterol (PROVENTIL) (2.5 MG/3ML) 0.083% nebulizer solution Take 2.5 mg by nebulization every 6 (six) hours as needed for wheezing or shortness of breath.    [name] aspirin 81 MG tablet Take 81 mg by mouth daily.      . carvedilol (COREG) 25 MG tablet Take 1 tablet (25 mg total) by mouth 2 (two) times daily with a meal. 60 tablet 11  . clopidogrel (PLAVIX) 75 MG tablet Take 1 tablet (75 mg total) by mouth daily. 30 tablet 11  . empagliflozin (JARDIANCE) 25 MG TABS tablet Take 25 mg by mouth daily.    . furosemide (LASIX) 40 MG tablet Take 1 tablet (40 mg total) by mouth daily. 30 tablet 0  . insulin glargine (LANTUS) 100 UNIT/ML injection Inject 0.5 mLs (50 Units total) into the skin 2 (two) times daily. 10 mL 11  . isosorbide mononitrate (IMDUR) 30 MG 24 hr tablet Take 1 tablet (30 mg total) by mouth 2 (two) times daily. 60 tablet 11  . linagliptin (TRADJENTA) 5 MG TABS tablet Take 5 mg by mouth daily.    [name] lisinopril (PRINIVIL,ZESTRIL) 10 MG tablet Take 1 tablet (10 mg total) by mouth daily. 30 tablet 0  . nitroGLYCERIN (NITROSTAT) 0.4 MG SL tablet Place 0.4 mg under the tongue every 5 (five) minutes as needed for chest pain.     . simvastatin (ZOCOR) 40 MG tablet TAKE ONE TABLET BY MOUTH AT BEDTIME 30 tablet 11   No current facility-administered medications for this visit.     Allergies:   Patient has no known allergies.   Social History:  The patient  reports that he quit smoking about 6 years ago. His smoking use included Cigarettes. He smoked 3.00 packs per day for 0.00 years. He has never used smokeless tobacco. He reports that he drinks alcohol. He reports that he does not use drugs.   Family History:  The patient's family history includes Heart attack in his father.  ROS:   Review of Systems  Constitutional: Positive for malaise/fatigue.  Negative for chills, diaphoresis, fever and weight loss.  HENT: Negative for congestion.   Eyes: Negative for discharge and redness.  Respiratory: Positive for shortness of breath. Negative for cough, hemoptysis, sputum production and wheezing.   Cardiovascular: Positive for chest pain. Negative for palpitations, orthopnea, claudication, leg swelling and PND.  Gastrointestinal: Negative for abdominal pain, blood in stool, heartburn, melena, nausea and vomiting.  Genitourinary: Negative for hematuria.  Musculoskeletal: Negative for falls and myalgias.  Skin: Negative for rash.  Neurological: Negative for dizziness, tingling, tremors, sensory change, speech change, focal weakness, loss of consciousness and weakness.  Endo/Heme/Allergies: Does not bruise/bleed easily.  Psychiatric/Behavioral: Negative for substance abuse. The patient is not nervous/anxious.   All other systems reviewed and are negative.    PHYSICAL EXAM:  VS:  BP 126/88 (BP Location: Left Arm, Patient Position: Sitting, Cuff Size: Normal)   Pulse 95  Ht 6' (1.829 m)   Wt 256 lb 8 oz (116.3 kg)   BMI 34.79 kg/m  BMI: Body mass index is 34.79 kg/m.  Physical Exam  Constitutional: He is oriented to person, place, and time. He appears well-developed and well-nourished.  HENT:  Head: Normocephalic and atraumatic.  Eyes: Right eye exhibits no discharge. Left eye exhibits no discharge.  Neck: Normal range of motion. No JVD present.  Cardiovascular: Normal rate, regular rhythm, S1 normal, S2 normal and normal heart sounds.  Exam reveals no distant heart sounds, no friction rub, no midsystolic click and no opening snap.   No murmur heard. Pulmonary/Chest: Effort normal and breath sounds normal. No respiratory distress. He has no decreased breath sounds. He has no wheezes. He has no rales. He exhibits no tenderness.  Abdominal: Soft. He exhibits no distension. There is no tenderness.  Obese.  Musculoskeletal: He exhibits no  edema.  Neurological: He is alert and oriented to person, place, and time.  Skin: Skin is warm and dry. No cyanosis. Nails show no clubbing.  Psychiatric: He has a normal mood and affect. His speech is normal and behavior is normal. Judgment and thought content normal.     EKG:  Was ordered and interpreted by me today. Shows NSR with PACs, 94 bpm, TWI leads I, aVL, V5, V6 (old)  Recent Labs: 07/20/2016: ALT 21 07/23/2016: BUN 19; Creatinine, Ser 1.27; Hemoglobin 15.2; Platelets 242; Potassium 4.1; Sodium 138  07/22/2016: Cholesterol 109; HDL 42; LDL Cholesterol 44; Total CHOL/HDL Ratio 2.6; Triglycerides 116; VLDL 23   CrCl cannot be calculated (Patient's most recent lab result is older than the maximum 21 days allowed.).   Wt Readings from Last 3 Encounters:  08/16/16 256 lb 8 oz (116.3 kg)  07/23/16 253 lb 11.2 oz (115.1 kg)  06/18/16 259 lb 8 oz (117.7 kg)     Other studies reviewed: Additional studies/records reviewed today include: summarized above  ASSESSMENT AND PLAN:  1. CAD as above: Still with intermittent chest pain that resolves with SL NTG x 1. Cardiac cath 07/2016 as above without large vessel disease, though with small vessel disease. Increase Imdur to 90 mg daily. Add Ranexa 500 mg bid and titrate as needed. Continue Coreg and simvastatin (LDL at goal).   2. ICM/chronic systolic CHF: He does not appear volume overloaded at this time. He reports not wanting to take Lasix any longer 2/2 voiding (last took this in the AM of 08/16/16). I advised him given his cardiomyopathy he should in fact continue this medication, he now agrees to do so. Continue Coreg and lisinopril. Consider adding spironolactone, though must be cautious given his renal disease. Will try to optimize medications prior to repeating echocardiogram to reassess EF to evaluate if patient will need an ICD in the future given his cardiomyopathy.   3. CKD stage III: Stable. Followed by PCP.  4. COPD: Per PCP.     Disposition: F/u with myself or Dr. Mariah Milling in 1 month.  Current medicines are reviewed at length with the patient today.  The patient did not have any concerns regarding medicines.  Elinor Dodge PA-C 08/16/2016 2:38 PM     [facility] [place] [address] [place], [place] [address] [phone]

## 2016-08-17 ENCOUNTER — Emergency Department
Admission: EM | Admit: 2016-08-17 | Discharge: 2016-08-17 | Disposition: A | Discharge status: Home / Self Care | Payer: Medicare Other | Attending: Emergency Medicine | Admitting: Emergency Medicine

## 2016-08-17 ENCOUNTER — Emergency Department: Payer: Medicare Other

## 2016-08-17 DIAGNOSIS — Z955 Presence of coronary angioplasty implant and graft: Secondary | ICD-10-CM | POA: Insufficient documentation

## 2016-08-17 DIAGNOSIS — R109 Unspecified abdominal pain: Secondary | ICD-10-CM | POA: Diagnosis not present

## 2016-08-17 DIAGNOSIS — M7918 Myalgia, other site: Secondary | ICD-10-CM

## 2016-08-17 DIAGNOSIS — Z79899 Other long term (current) drug therapy: Secondary | ICD-10-CM | POA: Diagnosis not present

## 2016-08-17 DIAGNOSIS — I2510 Atherosclerotic heart disease of native coronary artery without angina pectoris: Secondary | ICD-10-CM | POA: Diagnosis not present

## 2016-08-17 DIAGNOSIS — M791 Myalgia: Secondary | ICD-10-CM | POA: Insufficient documentation

## 2016-08-17 DIAGNOSIS — M546 Pain in thoracic spine: Secondary | ICD-10-CM | POA: Diagnosis not present

## 2016-08-17 DIAGNOSIS — E860 Dehydration: Secondary | ICD-10-CM | POA: Diagnosis not present

## 2016-08-17 DIAGNOSIS — M25511 Pain in right shoulder: Secondary | ICD-10-CM | POA: Diagnosis not present

## 2016-08-17 DIAGNOSIS — E1165 Type 2 diabetes mellitus with hyperglycemia: Secondary | ICD-10-CM | POA: Diagnosis not present

## 2016-08-17 DIAGNOSIS — J449 Chronic obstructive pulmonary disease, unspecified: Secondary | ICD-10-CM | POA: Insufficient documentation

## 2016-08-17 DIAGNOSIS — Z87891 Personal history of nicotine dependence: Secondary | ICD-10-CM | POA: Insufficient documentation

## 2016-08-17 DIAGNOSIS — I10 Essential (primary) hypertension: Secondary | ICD-10-CM | POA: Diagnosis not present

## 2016-08-17 DIAGNOSIS — Z7982 Long term (current) use of aspirin: Secondary | ICD-10-CM | POA: Insufficient documentation

## 2016-08-17 LAB — URINALYSIS, COMPLETE (UACMP) WITH MICROSCOPIC
Bacteria, UA: NONE SEEN
Bilirubin Urine: NEGATIVE
Glucose, UA: >=500 mg/dL — AB
Hgb urine dipstick: NEGATIVE
Ketones, ur: NEGATIVE mg/dL
Leukocytes, UA: NEGATIVE
Nitrite: NEGATIVE
Protein, ur: NEGATIVE mg/dL
RBC / HPF: NONE SEEN RBC/hpf (ref 0–5)
Specific Gravity, Urine: 1.025 (ref 1.005–1.030)
Squamous Epithelial / LPF: NONE SEEN
pH: 5 (ref 5.0–8.0)

## 2016-08-17 LAB — CBC
HCT: 44.8 % (ref 40.0–52.0)
Hemoglobin: 15.1 g/dL (ref 13.0–18.0)
MCH: 30.7 pg (ref 26.0–34.0)
MCHC: 33.7 g/dL (ref 32.0–36.0)
MCV: 91.1 fL (ref 80.0–100.0)
Platelets: 231 10*3/uL (ref 150–440)
RBC: 4.92 MIL/uL (ref 4.40–5.90)
RDW: 13.1 % (ref 11.5–14.5)
WBC: 8.4 10*3/uL (ref 3.8–10.6)

## 2016-08-17 LAB — COMPREHENSIVE METABOLIC PANEL
ALT: 21 U/L (ref 17–63)
AST: 17 U/L (ref 15–41)
Albumin: 4.1 g/dL (ref 3.5–5.0)
Alkaline Phosphatase: 86 U/L (ref 38–126)
Anion gap: 9 (ref 5–15)
BUN: 40 mg/dL — ABNORMAL HIGH (ref 6–20)
CO2: 26 mmol/L (ref 22–32)
Calcium: 9.7 mg/dL (ref 8.9–10.3)
Chloride: 102 mmol/L (ref 101–111)
Creatinine, Ser: 1.57 mg/dL — ABNORMAL HIGH (ref 0.61–1.24)
GFR calc Af Amer: 52 mL/min — ABNORMAL LOW (ref >60)
GFR calc non Af Amer: 45 mL/min — ABNORMAL LOW (ref >60)
Glucose, Bld: 412 mg/dL — ABNORMAL HIGH (ref 65–99)
Potassium: 4.3 mmol/L (ref 3.5–5.1)
Sodium: 137 mmol/L (ref 135–145)
Total Bilirubin: 0.5 mg/dL (ref 0.3–1.2)
Total Protein: 7.4 g/dL (ref 6.5–8.1)

## 2016-08-17 LAB — GLUCOSE, CAPILLARY: Glucose-Capillary: 300 mg/dL — ABNORMAL HIGH (ref 65–99)

## 2016-08-17 LAB — LIPASE, BLOOD: Lipase: 38 U/L (ref 11–51)

## 2016-08-17 MED ORDER — KETOROLAC TROMETHAMINE 30 MG/ML IJ SOLN
INTRAMUSCULAR | Status: AC
  Filled 2016-08-17: qty 1

## 2016-08-17 MED ORDER — KETOROLAC TROMETHAMINE 30 MG/ML IJ SOLN
30.0000 mg | Freq: Once | INTRAMUSCULAR | Status: AC
  Administered 2016-08-17: 30 mg via INTRAVENOUS

## 2016-08-17 MED ORDER — SODIUM CHLORIDE 0.9 % IV SOLN
1000.0000 mL | Freq: Once | INTRAVENOUS | Status: AC
  Administered 2016-08-17: 1000 mL via INTRAVENOUS

## 2016-08-17 MED ORDER — NAPROXEN 500 MG PO TABS: 500.0000 mg | ORAL_TABLET | Freq: Two times a day (BID) | ORAL | 2 refills | Status: DC

## 2016-08-17 NOTE — ED Notes (Signed)
Blood glucose POCT = 300.

## 2016-08-17 NOTE — ED Provider Notes (Signed)
[facility] Emergency Department Provider Note   ____________________________________________    I have reviewed the triage vital signs and the nursing notes.   HISTORY  Chief Complaint Back pain    HPI Danny ScrapeRobert M Clemons Sr. is a 66 y.o. male who presents with back pain. Patient reports right thoracic paraspinal back pain for several weeks. He reports it seems to become more painful recently, it is worse with flexion or twisting of the torso, it is worse with palpation. No fevers or chills. No dysuria. No hematuria. Distant history of kidney stone but denies flank pain. No abdominal pain. No chest pain. No neuro deficits   Past Medical History:  Diagnosis Date  . CAD (coronary artery disease)   . COPD (chronic obstructive pulmonary disease) (HCC)   . DM2 (diabetes mellitus, type 2) (HCC)   . HLD (hyperlipidemia)   . HTN (hypertension)   . Ischemic cardiomyopathy   . NICM (nonischemic cardiomyopathy) [facility](HCC)     Patient Active Problem List   Diagnosis Date Noted  . NSTEMI (non-ST elevated myocardial infarction) (HCC)   . AKI (acute kidney injury) (HCC)   . Morbid obesity (HCC) 06/18/2016  . H/O medication noncompliance 06/18/2016  . COPD (chronic obstructive pulmonary disease) (HCC) 06/20/2015  . GERD (gastroesophageal reflux disease) 12/20/2014  . Syncope 12/13/2014  . Unstable angina (HCC) 09/22/2014  . Renal insufficiency 02/23/2013  . Angina pectoris (HCC) 10/17/2011  . Diabetes mellitus type 2, uncontrolled, with complications (HCC) 06/05/2010  . Hyperlipidemia 02/10/2010  . HYPERTENSION, BENIGN 02/10/2010  . CAD (coronary artery disease) 02/10/2010    Past Surgical History:  Procedure Laterality Date  . CAD: stent to the LAD    . CARDIAC CATHETERIZATION  10/01/2014  . CARDIAC CATHETERIZATION N/A 07/23/2016   Procedure: Left Heart Cath and Coronary Angiography;  Surgeon: Iran OuchMuhammad A Arida, MD;  Location: [facility] INVASIVE CV LAB;  Service:  Cardiovascular;  Laterality: N/A;  . CORONARY ANGIOPLASTY WITH STENT PLACEMENT  10/01/2014    Prior to Admission medications   Medication Sig Start Date End Date Taking? Authorizing Provider  acetaminophen (TYLENOL) 325 MG tablet Take 2 tablets (650 mg total) by mouth every 6 (six) hours as needed for mild pain (or Fever >/= 101). 07/23/16   Ramonita LabAruna Gouru, MD  albuterol (PROVENTIL) (2.5 MG/3ML) 0.083% nebulizer solution Take 2.5 mg by nebulization every 6 (six) hours as needed for wheezing or shortness of breath.    Historical Provider, MD  aspirin 81 MG tablet Take 81 mg by mouth daily.      Historical Provider, MD  carvedilol (COREG) 25 MG tablet Take 1 tablet (25 mg total) by mouth 2 (two) times daily with a meal. 06/18/16   Antonieta Ibaimothy J Gollan, MD  clopidogrel (PLAVIX) 75 MG tablet Take 1 tablet (75 mg total) by mouth daily. 06/18/16   Antonieta Ibaimothy J Gollan, MD  empagliflozin (JARDIANCE) 25 MG TABS tablet Take 25 mg by mouth daily.    Historical Provider, MD  furosemide (LASIX) 40 MG tablet Take 1 tablet (40 mg total) by mouth daily. 07/23/16   Ramonita LabAruna Gouru, MD  insulin glargine (LANTUS) 100 UNIT/ML injection Inject 0.5 mLs (50 Units total) into the skin 2 (two) times daily. 07/23/16   Ramonita LabAruna Gouru, MD  isosorbide mononitrate (IMDUR) 30 MG 24 hr tablet Take 1 tablet (30 mg total) by mouth 3 (three) times daily. 08/16/16 11/14/16  Ryan M Dunn, PA-C  linagliptin (TRADJENTA) 5 MG TABS tablet Take 5 mg by mouth daily.  Historical Provider, MD  lisinopril (PRINIVIL,ZESTRIL) 10 MG tablet Take 1 tablet (10 mg total) by mouth daily. 07/24/16   Ramonita Lab, MD  naproxen (NAPROSYN) 500 MG tablet Take 1 tablet (500 mg total) by mouth 2 (two) times daily with a meal. 08/17/16   Jene Every, MD  nitroGLYCERIN (NITROSTAT) 0.4 MG SL tablet Place 0.4 mg under the tongue every 5 (five) minutes as needed for chest pain.     Historical Provider, MD  ranolazine (RANEXA) 500 MG 12 hr tablet Take 1 tablet (500 mg total) by  mouth 2 (two) times daily. 08/16/16   Sondra Barges, PA-C  simvastatin (ZOCOR) 40 MG tablet TAKE ONE TABLET BY MOUTH AT BEDTIME 06/18/16   [name], MD     Allergies Patient has no known allergies.  Family History  Problem Relation Age of Onset  . Heart attack Father     complications    Social History Social History  Substance Use Topics  . Smoking status: Former Smoker    Packs/day: 3.00    Years: 0.00    Types: Cigarettes    Quit date: 09/07/2009  . Smokeless tobacco: Never Used     Comment: quit june 2011  . Alcohol use Yes    Review of Systems  Constitutional: No fever/chills   Cardiovascular: Denies chest pain. Respiratory: Denies shortness of breath. Gastrointestinal: No abdominal pain.  No nausea, no vomiting.   Genitourinary: Negative for dysuria. No hematuria Musculoskeletal as above Skin: Negative for rash. Neurological: Negative for weakness  10-point ROS otherwise negative.  ____________________________________________   PHYSICAL EXAM:  VITAL SIGNS: ED Triage Vitals [08/17/16 1259]  Enc Vitals Group     BP 115/87     Pulse Rate 92     Resp 16     Temp 97.9 F (36.6 C)     Temp Source Oral     SpO2 94 %     Weight 256 lb (116.1 kg)     Height 6' (1.829 m)     Head Circumference      Peak Flow      Pain Score      Pain Loc      Pain Edu?      Excl. in GC?     Constitutional: Alert and oriented. No acute distress. Pleasant and interactive Eyes: Conjunctivae are normal.   Nose: No congestion/rhinnorhea. Mouth/Throat: Mucous membranes are moist.    Cardiovascular: Normal rate, regular rhythm. Grossly normal heart sounds.  Good peripheral circulation. Respiratory: Normal respiratory effort.  No retractions. Lungs CTAB. Gastrointestinal: Soft and nontender. No distention.  No CVA tenderness. Genitourinary: deferred Musculoskeletal: Back: Tenderness to palpation just inferior to the right scapula. Worse with flexion, patient is able  to ambulate but it is painful. No lower extremity tenderness nor edema.  Warm and well perfused Neurologic:  Normal speech and language. No gross focal neurologic deficits are appreciated.  Skin:  Skin is warm, dry and intact. No rash noted. Psychiatric: Mood and affect are normal. Speech and behavior are normal.  ____________________________________________   LABS (all labs ordered are listed, but only abnormal results are displayed)  Labs Reviewed  COMPREHENSIVE METABOLIC PANEL - Abnormal; Notable for the following:       Result Value   Glucose, Bld 412 (*)    BUN 40 (*)    Creatinine, Ser 1.57 (*)    GFR calc non Af Amer 45 (*)    GFR calc Af Amer 52 (*)  All other components within normal limits  URINALYSIS, COMPLETE (UACMP) WITH MICROSCOPIC - Abnormal; Notable for the following:    Color, Urine YELLOW (*)    APPearance CLEAR (*)    Glucose, UA >=500 (*)    All other components within normal limits  GLUCOSE, CAPILLARY - Abnormal; Notable for the following:    Glucose-Capillary 300 (*)    All other components within normal limits  LIPASE, BLOOD  CBC  CBG MONITORING, ED   ____________________________________________  EKG  ED ECG REPORT I, Jene Every, the attending physician, personally viewed and interpreted this ECG.  Date: 08/17/2016  Rhythm: normal sinus rhythm QRS Axis: normal Intervals: normal ST/T Wave abnormalities: Nonspecific Conduction Disturbances: none Narrative Interpretation: unremarkable  ____________________________________________  RADIOLOGY  CT scan unremarkable ____________________________________________   PROCEDURES  Procedure(s) performed: No    Critical Care performed:No ____________________________________________   INITIAL IMPRESSION / ASSESSMENT AND PLAN / ED COURSE  Pertinent labs & imaging results that were available during my care of the patient were reviewed by me and considered in my medical decision making (see  chart for details).  Patient presents with what appears to be right-sided muscular skeletal back pain. He is also hyperglycemic and mildly dehydrated, we will treat with IV fluids and pain medication and obtain imaging and reevaluate.  Clinical Course   Patient's pain improved greatly with Toradol. CT scan is reassuring. Exam consistent with muscular skeletal injury. Recommend supportive care and outpatient follow-up with PCP. Return precautions discussed ____________________________________________   FINAL CLINICAL IMPRESSION(S) / ED DIAGNOSES  Final diagnoses:  Right flank pain  Musculoskeletal pain      NEW MEDICATIONS STARTED DURING THIS VISIT:  Discharge Medication List as of 08/17/2016  6:20 PM    START taking these medications   Details  naproxen (NAPROSYN) 500 MG tablet Take 1 tablet (500 mg total) by mouth 2 (two) times daily with a meal., Starting Fri 08/17/2016, Print         Note:  This document was prepared using Dragon voice recognition software and may include unintentional dictation errors.    Jene Every, MD 08/17/16 1925

## 2016-08-17 NOTE — ED Triage Notes (Signed)
Pt reports right sided flank pain going on for over 1 week. Reports history of kidney stone. Denies urinary symptoms. VS stable.

## 2016-08-21 DIAGNOSIS — F17200 Nicotine dependence, unspecified, uncomplicated: Secondary | ICD-10-CM | POA: Diagnosis not present

## 2016-08-21 DIAGNOSIS — F321 Major depressive disorder, single episode, moderate: Secondary | ICD-10-CM | POA: Diagnosis not present

## 2016-08-21 DIAGNOSIS — I2510 Atherosclerotic heart disease of native coronary artery without angina pectoris: Secondary | ICD-10-CM | POA: Diagnosis not present

## 2016-08-21 DIAGNOSIS — I110 Hypertensive heart disease with heart failure: Secondary | ICD-10-CM | POA: Diagnosis not present

## 2016-08-21 DIAGNOSIS — Z7951 Long term (current) use of inhaled steroids: Secondary | ICD-10-CM | POA: Diagnosis not present

## 2016-08-21 DIAGNOSIS — I509 Heart failure, unspecified: Secondary | ICD-10-CM | POA: Diagnosis not present

## 2016-08-21 DIAGNOSIS — E1165 Type 2 diabetes mellitus with hyperglycemia: Secondary | ICD-10-CM | POA: Diagnosis not present

## 2016-08-21 DIAGNOSIS — E785 Hyperlipidemia, unspecified: Secondary | ICD-10-CM | POA: Diagnosis not present

## 2016-08-21 DIAGNOSIS — E669 Obesity, unspecified: Secondary | ICD-10-CM | POA: Diagnosis not present

## 2016-08-21 DIAGNOSIS — M129 Arthropathy, unspecified: Secondary | ICD-10-CM | POA: Diagnosis not present

## 2016-08-21 DIAGNOSIS — E1140 Type 2 diabetes mellitus with diabetic neuropathy, unspecified: Secondary | ICD-10-CM | POA: Diagnosis not present

## 2016-08-21 DIAGNOSIS — J449 Chronic obstructive pulmonary disease, unspecified: Secondary | ICD-10-CM | POA: Diagnosis not present

## 2016-08-21 DIAGNOSIS — M810 Age-related osteoporosis without current pathological fracture: Secondary | ICD-10-CM | POA: Diagnosis not present

## 2016-08-21 DIAGNOSIS — Z794 Long term (current) use of insulin: Secondary | ICD-10-CM | POA: Diagnosis not present

## 2016-08-21 DIAGNOSIS — F419 Anxiety disorder, unspecified: Secondary | ICD-10-CM | POA: Diagnosis not present

## 2016-08-23 DIAGNOSIS — I2510 Atherosclerotic heart disease of native coronary artery without angina pectoris: Secondary | ICD-10-CM | POA: Diagnosis not present

## 2016-08-23 DIAGNOSIS — E1165 Type 2 diabetes mellitus with hyperglycemia: Secondary | ICD-10-CM | POA: Diagnosis not present

## 2016-08-23 DIAGNOSIS — I509 Heart failure, unspecified: Secondary | ICD-10-CM | POA: Diagnosis not present

## 2016-08-23 DIAGNOSIS — E1140 Type 2 diabetes mellitus with diabetic neuropathy, unspecified: Secondary | ICD-10-CM | POA: Diagnosis not present

## 2016-08-23 DIAGNOSIS — I110 Hypertensive heart disease with heart failure: Secondary | ICD-10-CM | POA: Diagnosis not present

## 2016-08-23 DIAGNOSIS — J449 Chronic obstructive pulmonary disease, unspecified: Secondary | ICD-10-CM | POA: Diagnosis not present

## 2016-08-24 DIAGNOSIS — I252 Old myocardial infarction: Secondary | ICD-10-CM | POA: Diagnosis not present

## 2016-08-24 DIAGNOSIS — M545 Low back pain: Secondary | ICD-10-CM | POA: Diagnosis not present

## 2016-08-24 DIAGNOSIS — E119 Type 2 diabetes mellitus without complications: Secondary | ICD-10-CM | POA: Diagnosis not present

## 2016-08-24 DIAGNOSIS — I10 Essential (primary) hypertension: Secondary | ICD-10-CM | POA: Diagnosis not present

## 2016-08-24 DIAGNOSIS — J449 Chronic obstructive pulmonary disease, unspecified: Secondary | ICD-10-CM | POA: Diagnosis not present

## 2016-08-27 ENCOUNTER — Telehealth: Payer: Self-pay | Admitting: Cardiovascular Disease

## 2016-08-27 MED ORDER — RANOLAZINE ER 1000 MG PO TB12: 1000.0000 mg | ORAL_TABLET | Freq: Two times a day (BID) | ORAL | 3 refills | Status: DC

## 2016-08-27 MED ORDER — ISOSORBIDE MONONITRATE ER 30 MG PO TB24: 30.0000 mg | ORAL_TABLET | Freq: Four times a day (QID) | ORAL | 3 refills | Status: DC

## 2016-08-27 NOTE — Telephone Encounter (Signed)
Spoke with home health nurse who reports that patients PCP reported some compliance issues and they started home health to assist and evaluate patient. Patient is illiterate and has issues with comprehension. She also reports that patient has very uncontrolled diabetes with blood sugars running from 250-550 range. She states that he has blurred vision and just overall is unable to see well. She reports that they have occupational therapy and social work involved to assist with his care. She reports that he complains of chronic chest pain daily. She reports that he takes nitroglycerin and she wanted to confirm his daily medications. Reviewed all medications and confirmed upcoming appointment with Dr. Mariah MillingGollan. Patient has had recent catheterization and started on new medications for his chest pain. She verbalized understanding of our conversation and had no further questions at this time. She wanted us to just be aware of his problems so that when he comes in we will know about these issues. Instructed her to call back if symptoms worsen.

## 2016-08-27 NOTE — Telephone Encounter (Signed)
Nurse with [facility] needs to verify cardiac medications.States pt is having daily CP, and taking Nitroglycerin daily.  Please call.

## 2016-08-27 NOTE — Telephone Encounter (Signed)
Can increase Ranexa to 1000 mg bid. BP looks like there will be enough room to increase Imdur to 120 mg daily. Already on Coreg 25 mg bid.

## 2016-08-27 NOTE — Telephone Encounter (Signed)
Reviewed recommendations with Carollee HerterShannon RN home health nurse and sent in new prescriptions with updated instructions. She verbalized understanding with no further questions at this time.

## 2016-08-27 NOTE — Telephone Encounter (Signed)
Left voicemail message to call back to review recommendations and medication changes.

## 2016-08-28 DIAGNOSIS — I110 Hypertensive heart disease with heart failure: Secondary | ICD-10-CM | POA: Diagnosis not present

## 2016-08-28 DIAGNOSIS — I2510 Atherosclerotic heart disease of native coronary artery without angina pectoris: Secondary | ICD-10-CM | POA: Diagnosis not present

## 2016-08-28 DIAGNOSIS — E1165 Type 2 diabetes mellitus with hyperglycemia: Secondary | ICD-10-CM | POA: Diagnosis not present

## 2016-08-28 DIAGNOSIS — J449 Chronic obstructive pulmonary disease, unspecified: Secondary | ICD-10-CM | POA: Diagnosis not present

## 2016-08-28 DIAGNOSIS — E1140 Type 2 diabetes mellitus with diabetic neuropathy, unspecified: Secondary | ICD-10-CM | POA: Diagnosis not present

## 2016-08-28 DIAGNOSIS — I509 Heart failure, unspecified: Secondary | ICD-10-CM | POA: Diagnosis not present

## 2016-08-31 ENCOUNTER — Emergency Department
Admission: EM | Admit: 2016-08-31 | Discharge: 2016-08-31 | Disposition: A | Discharge status: Home / Self Care | Payer: Medicare Other | Attending: Emergency Medicine | Admitting: Emergency Medicine

## 2016-08-31 ENCOUNTER — Emergency Department: Payer: Medicare Other

## 2016-08-31 ENCOUNTER — Encounter: Payer: Self-pay | Admitting: Pulmonary Disease

## 2016-08-31 ENCOUNTER — Encounter: Payer: Self-pay | Admitting: Intensive Care

## 2016-08-31 DIAGNOSIS — I509 Heart failure, unspecified: Secondary | ICD-10-CM | POA: Insufficient documentation

## 2016-08-31 DIAGNOSIS — I110 Hypertensive heart disease with heart failure: Secondary | ICD-10-CM | POA: Diagnosis not present

## 2016-08-31 DIAGNOSIS — E119 Type 2 diabetes mellitus without complications: Secondary | ICD-10-CM | POA: Insufficient documentation

## 2016-08-31 DIAGNOSIS — J449 Chronic obstructive pulmonary disease, unspecified: Secondary | ICD-10-CM | POA: Diagnosis not present

## 2016-08-31 DIAGNOSIS — T7840XA Allergy, unspecified, initial encounter: Secondary | ICD-10-CM | POA: Diagnosis not present

## 2016-08-31 DIAGNOSIS — T426X5A Adverse effect of other antiepileptic and sedative-hypnotic drugs, initial encounter: Secondary | ICD-10-CM | POA: Diagnosis not present

## 2016-08-31 DIAGNOSIS — Z87891 Personal history of nicotine dependence: Secondary | ICD-10-CM | POA: Diagnosis not present

## 2016-08-31 DIAGNOSIS — Y829 Unspecified medical devices associated with adverse incidents: Secondary | ICD-10-CM | POA: Diagnosis not present

## 2016-08-31 DIAGNOSIS — R42 Dizziness and giddiness: Secondary | ICD-10-CM | POA: Diagnosis not present

## 2016-08-31 DIAGNOSIS — Z794 Long term (current) use of insulin: Secondary | ICD-10-CM | POA: Insufficient documentation

## 2016-08-31 DIAGNOSIS — Z7982 Long term (current) use of aspirin: Secondary | ICD-10-CM | POA: Diagnosis not present

## 2016-08-31 DIAGNOSIS — T887XXA Unspecified adverse effect of drug or medicament, initial encounter: Secondary | ICD-10-CM | POA: Diagnosis not present

## 2016-08-31 DIAGNOSIS — E1140 Type 2 diabetes mellitus with diabetic neuropathy, unspecified: Secondary | ICD-10-CM | POA: Diagnosis not present

## 2016-08-31 DIAGNOSIS — I2510 Atherosclerotic heart disease of native coronary artery without angina pectoris: Secondary | ICD-10-CM | POA: Insufficient documentation

## 2016-08-31 DIAGNOSIS — T438X5A Adverse effect of other psychotropic drugs, initial encounter: Secondary | ICD-10-CM | POA: Insufficient documentation

## 2016-08-31 DIAGNOSIS — R51 Headache: Secondary | ICD-10-CM | POA: Diagnosis not present

## 2016-08-31 DIAGNOSIS — E1165 Type 2 diabetes mellitus with hyperglycemia: Secondary | ICD-10-CM | POA: Diagnosis not present

## 2016-08-31 DIAGNOSIS — T50905A Adverse effect of unspecified drugs, medicaments and biological substances, initial encounter: Secondary | ICD-10-CM

## 2016-08-31 LAB — CBC
HCT: 47.2 % (ref 40.0–52.0)
Hemoglobin: 15.8 g/dL (ref 13.0–18.0)
MCH: 30.6 pg (ref 26.0–34.0)
MCHC: 33.4 g/dL (ref 32.0–36.0)
MCV: 91.6 fL (ref 80.0–100.0)
Platelets: 230 10*3/uL (ref 150–440)
RBC: 5.15 MIL/uL (ref 4.40–5.90)
RDW: 13.3 % (ref 11.5–14.5)
WBC: 10.5 10*3/uL (ref 3.8–10.6)

## 2016-08-31 LAB — COMPREHENSIVE METABOLIC PANEL
ALT: 18 U/L (ref 17–63)
AST: 20 U/L (ref 15–41)
Albumin: 4.1 g/dL (ref 3.5–5.0)
Alkaline Phosphatase: 72 U/L (ref 38–126)
Anion gap: 8 (ref 5–15)
BUN: 38 mg/dL — ABNORMAL HIGH (ref 6–20)
CO2: 26 mmol/L (ref 22–32)
Calcium: 9.1 mg/dL (ref 8.9–10.3)
Chloride: 103 mmol/L (ref 101–111)
Creatinine, Ser: 1.79 mg/dL — ABNORMAL HIGH (ref 0.61–1.24)
GFR calc Af Amer: 44 mL/min — ABNORMAL LOW (ref >60)
GFR calc non Af Amer: 38 mL/min — ABNORMAL LOW (ref >60)
Glucose, Bld: 303 mg/dL — ABNORMAL HIGH (ref 65–99)
Potassium: 4.8 mmol/L (ref 3.5–5.1)
Sodium: 137 mmol/L (ref 135–145)
Total Bilirubin: 0.4 mg/dL (ref 0.3–1.2)
Total Protein: 7.3 g/dL (ref 6.5–8.1)

## 2016-08-31 LAB — URINALYSIS, COMPLETE (UACMP) WITH MICROSCOPIC
Bacteria, UA: NONE SEEN
Bilirubin Urine: NEGATIVE
Glucose, UA: >=500 mg/dL — AB
Hgb urine dipstick: NEGATIVE
Ketones, ur: NEGATIVE mg/dL
Leukocytes, UA: NEGATIVE
Nitrite: NEGATIVE
Protein, ur: NEGATIVE mg/dL
RBC / HPF: NONE SEEN RBC/hpf (ref 0–5)
Specific Gravity, Urine: 1.021 (ref 1.005–1.030)
pH: 5 (ref 5.0–8.0)

## 2016-08-31 LAB — TROPONIN I
Troponin I: 0.03 ng/mL (ref <0.03)
Troponin I: <0.03 ng/mL (ref <0.03)

## 2016-08-31 MED ORDER — SODIUM CHLORIDE 0.9 % IV BOLUS (SEPSIS)
250.0000 mL | Freq: Once | INTRAVENOUS | Status: AC
  Administered 2016-08-31: 250 mL via INTRAVENOUS

## 2016-08-31 MED ORDER — GABAPENTIN 100 MG PO CAPS: 100.0000 mg | ORAL_CAPSULE | Freq: Three times a day (TID) | ORAL | 0 refills | Status: DC

## 2016-08-31 NOTE — ED Triage Notes (Signed)
Pt arrived by EMS from residence. Patient reports starting on Gabapentin last Tuesday and yesterday started experiencing blurry vision and jitters around 12 after taking medicine. Denies chest pain. HX CHF, MI, diabetes. EMS v/s WNL and blood sugar 324 with EMS. Pt ambulatory in room with NAD noted. A&O x4

## 2016-08-31 NOTE — ED Provider Notes (Signed)
[facility] Emergency Department Provider Note  ____________________________________________  Time seen: Approximately 11:38 AM  I have reviewed the triage vital signs and the nursing notes.   HISTORY  Chief Complaint Allergic Reaction   HPI Danny SEIDER Sr. is a 66 y.o. male history of CAD, CHF, COPD, diabetes, hypertension, hyperlipidemia who presents for evaluation of an allergic reaction. Patient was started on gabapentin 3 days ago for neuropathy. He was given 400 mg 3 times a day. Patient reports that every time he takes the medication 30-40 minutes later he becomes swimmy headed, has blurry vision, feels like he is drunk, and dizzy. He had it on Tuesday. On Wednesday when he woke up he felt better and started having the symptoms after the second dose on Wednesday and the same yesterday. Today patient took his first dose and started feeling the same way. He then lay down for a little bit but it wasn't getting better so he called the ambulance to be evaluated. Patient denies tongue swelling, stridor, difficulty swallowing or breathing, hives, vomiting or diarrhea, CP, SOB. He has never been on gabapentin before. He does not take any other opiates. He also has had a headache since this morning that he describes as frontal, sharp, 5 out of 10.  Past Medical History:  Diagnosis Date  . CAD (coronary artery disease)   . CHF (congestive heart failure) (HCC)   . COPD (chronic obstructive pulmonary disease) (HCC)   . DM2 (diabetes mellitus, type 2) (HCC)   . HLD (hyperlipidemia)   . HTN (hypertension)   . Ischemic cardiomyopathy   . NICM (nonischemic cardiomyopathy) [facility])     Patient Active Problem List   Diagnosis Date Noted  . NSTEMI (non-ST elevated myocardial infarction) (HCC)   . AKI (acute kidney injury) (HCC)   . Morbid obesity (HCC) 06/18/2016  . H/O medication noncompliance 06/18/2016  . COPD (chronic obstructive pulmonary disease) (HCC)  06/20/2015  . GERD (gastroesophageal reflux disease) 12/20/2014  . Syncope 12/13/2014  . Unstable angina (HCC) 09/22/2014  . Renal insufficiency 02/23/2013  . Angina pectoris (HCC) 10/17/2011  . Diabetes mellitus type 2, uncontrolled, with complications (HCC) 06/05/2010  . Hyperlipidemia 02/10/2010  . HYPERTENSION, BENIGN 02/10/2010  . CAD (coronary artery disease) 02/10/2010    Past Surgical History:  Procedure Laterality Date  . CAD: stent to the LAD    . CARDIAC CATHETERIZATION  10/01/2014  . CARDIAC CATHETERIZATION N/A 07/23/2016   Procedure: Left Heart Cath and Coronary Angiography;  Surgeon: Iran Ouch, MD;  Location: [facility] INVASIVE CV LAB;  Service: Cardiovascular;  Laterality: N/A;  . CORONARY ANGIOPLASTY WITH STENT PLACEMENT  10/01/2014    Prior to Admission medications   Medication Sig Start Date End Date Taking? Authorizing Provider  acetaminophen (TYLENOL) 325 MG tablet Take 2 tablets (650 mg total) by mouth every 6 (six) hours as needed for mild pain (or Fever >/= 101). 07/23/16  Yes Ramonita Lab, MD  albuterol (PROVENTIL) (2.5 MG/3ML) 0.083% nebulizer solution Take 2.5 mg by nebulization every 6 (six) hours as needed for wheezing or shortness of breath.   Yes Historical Provider, MD  aspirin 81 MG tablet Take 81 mg by mouth daily.     Yes Historical Provider, MD  carvedilol (COREG) 25 MG tablet Take 1 tablet (25 mg total) by mouth 2 (two) times daily with a meal. 06/18/16  Yes [name], MD  clopidogrel (PLAVIX) 75 MG tablet Take 1 tablet (75 mg total) by mouth daily. 06/18/16  Yes Antonieta Ibaimothy J Gollan, MD  empagliflozin (JARDIANCE) 25 MG TABS tablet Take 25 mg by mouth daily.   Yes Historical Provider, MD  furosemide (LASIX) 40 MG tablet Take 1 tablet (40 mg total) by mouth daily. 07/23/16  Yes Ramonita LabAruna Gouru, MD  glipiZIDE (GLUCOTROL) 10 MG tablet Take 10 mg by mouth 2 (two) times daily before a meal.   Yes Historical Provider, MD  insulin glargine (LANTUS) 100  UNIT/ML injection Inject 0.5 mLs (50 Units total) into the skin 2 (two) times daily. Patient taking differently: Inject 20 Units into the skin daily.  07/23/16  Yes Ramonita LabAruna Gouru, MD  isosorbide mononitrate (IMDUR) 30 MG 24 hr tablet Take 1 tablet (30 mg total) by mouth 4 (four) times daily. 08/27/16 11/25/16 Yes Ryan M Dunn, PA-C  lisinopril (PRINIVIL,ZESTRIL) 10 MG tablet Take 1 tablet (10 mg total) by mouth daily. 07/24/16  Yes Ramonita LabAruna Gouru, MD  nitroGLYCERIN (NITROSTAT) 0.4 MG SL tablet Place 0.4 mg under the tongue every 5 (five) minutes as needed for chest pain.    Yes Historical Provider, MD  pantoprazole (PROTONIX) 40 MG tablet Take 40 mg by mouth daily.   Yes Historical Provider, MD  potassium chloride (K-DUR) 10 MEQ tablet Take 10 mEq by mouth daily.   Yes Historical Provider, MD  ranolazine (RANEXA) 1000 MG SR tablet Take 1 tablet (1,000 mg total) by mouth 2 (two) times daily. 08/27/16  Yes Ryan M Dunn, PA-C  simvastatin (ZOCOR) 40 MG tablet TAKE ONE TABLET BY MOUTH AT BEDTIME 06/18/16  Yes Antonieta Ibaimothy J Gollan, MD  tamsulosin (FLOMAX) 0.4 MG CAPS capsule Take 0.4 mg by mouth daily.   Yes Historical Provider, MD  traZODone (DESYREL) 150 MG tablet Take by mouth at bedtime.   Yes Historical Provider, MD  gabapentin (NEURONTIN) 100 MG capsule Take 1 capsule (100 mg total) by mouth 3 (three) times daily. 08/31/16   Nita Sicklearolina Kimmora Risenhoover, MD  linagliptin (TRADJENTA) 5 MG TABS tablet Take 5 mg by mouth daily.    Historical Provider, MD  naproxen (NAPROSYN) 500 MG tablet Take 1 tablet (500 mg total) by mouth 2 (two) times daily with a meal. 08/17/16   Jene Everyobert Kinner, MD    Allergies Patient has no known allergies.  Family History  Problem Relation Age of Onset  . Heart attack Father     complications    Social History Social History  Substance Use Topics  . Smoking status: Former Smoker    Packs/day: 3.00    Years: 0.00    Types: Cigarettes    Quit date: 09/07/2009  . Smokeless tobacco: Never Used       Comment: quit june 2011  . Alcohol use Yes     Comment: every other weekend    Review of Systems  Constitutional: Negative for fever. + dizziness Eyes: + blurry vision ENT: Negative for sore throat. Neck: No neck pain  Cardiovascular: Negative for chest pain. Respiratory: Negative for shortness of breath. Gastrointestinal: Negative for abdominal pain, vomiting or diarrhea. Genitourinary: Negative for dysuria. Musculoskeletal: Negative for back pain. Skin: Negative for rash. Neurological: Negative for headaches, weakness or numbness. Psych: No SI or HI  ____________________________________________   PHYSICAL EXAM:  VITAL SIGNS: ED Triage Vitals  Enc Vitals Group     BP 08/31/16 1055 (!) 120/91     Pulse Rate 08/31/16 1055 96     Resp 08/31/16 1055 18     Temp 08/31/16 1055 97.7 F (36.5 C)     Temp Source 08/31/16  1055 Oral     SpO2 08/31/16 1055 96 %     Weight 08/31/16 1057 256 lb (116.1 kg)     Height 08/31/16 1057 6' (1.829 m)     Head Circumference --      Peak Flow --      Pain Score 08/31/16 1057 3     Pain Loc --      Pain Edu? --      Excl. in GC? --     Constitutional: Alert and oriented. Well appearing and in no apparent distress. HEENT:      Head: Normocephalic and atraumatic.         Eyes: Conjunctivae are normal. Sclera is non-icteric. EOMI. PERRL      Mouth/Throat: Mucous membranes are moist.       Neck: Supple with no signs of meningismus. Cardiovascular: Regular rate and rhythm. No murmurs, gallops, or rubs. 2+ symmetrical distal pulses are present in all extremities. No JVD. Respiratory: Normal respiratory effort. Lungs are clear to auscultation bilaterally. No wheezes, crackles, or rhonchi.  Gastrointestinal: Soft, non tender, and non distended with positive bowel sounds. No rebound or guarding. Genitourinary: No CVA tenderness. Musculoskeletal: Nontender with normal range of motion in all extremities. No edema, cyanosis, or erythema of  extremities. Neurologic: Normal speech and language. A & O x3, PERRL, no nystagmus, CN II-XII intact, motor testing reveals good tone and bulk throughout. There is no evidence of pronator drift or dysmetria. Muscle strength is 5/5 throughout. Deep tendon reflexes are 2+ throughout with downgoing toes. Sensory examination is intact. Gait deferred Skin: Skin is warm, dry and intact. No rash noted. Psychiatric: Mood and affect are normal. Speech and behavior are normal.  ____________________________________________   LABS (all labs ordered are listed, but only abnormal results are displayed)  Labs Reviewed  COMPREHENSIVE METABOLIC PANEL - Abnormal; Notable for the following:       Result Value   Glucose, Bld 303 (*)    BUN 38 (*)    Creatinine, Ser 1.79 (*)    GFR calc non Af Amer 38 (*)    GFR calc Af Amer 44 (*)    All other components within normal limits  TROPONIN I - Abnormal; Notable for the following:    Troponin I 0.03 (*)    All other components within normal limits  URINALYSIS, COMPLETE (UACMP) WITH MICROSCOPIC - Abnormal; Notable for the following:    Color, Urine YELLOW (*)    APPearance CLEAR (*)    Glucose, UA >=500 (*)    Squamous Epithelial / LPF 0-5 (*)    All other components within normal limits  CBC  TROPONIN I   ____________________________________________  EKG  ED ECG REPORT I, Nita Sickle, the attending physician, personally viewed and interpreted this ECG.  Normal sinus rhythm, rate of 98, normal intervals, normal axis, no ST elevations or depressions, nonspecific T-wave abnormalities in inferior lateral leads which are unchanged from prior.  ____________________________________________  RADIOLOGY  Head CT: Mild cerebellar atrophy. No acute intracranial abnormality seen. ____________________________________________   PROCEDURES  Procedure(s) performed: None Procedures Critical Care performed:   None ____________________________________________   INITIAL IMPRESSION / ASSESSMENT AND PLAN / ED COURSE   66 y.o. male history of CAD, CHF, COPD, diabetes, hypertension, hyperlipidemia who presents for evaluation of dizziness, blurry vision, and feeling drunk after being started on high doses of gabapentin. Patient has never been on this medicine before. The symptoms started 40 minutes after taking the medication. Patient is neurologically  intact, hemodynamically stable, physical exam with no acute findings. Since patient is on Plavix will get a head CT to make sure patient doesn't have a head bleed. We'll check basic blood work for a KI or electrolyte abnormalities. We'll give gentle hydration.  Clinical Course as of Aug 31 1516  Fri Aug 31, 2016  1512 Patient feels improved, ambulating without difficulty, blood work with no acute findings, remains neurologically intact. Patient will be discharged home, recommended that he discontinued his gabapentin and restart taking this medication with a much lower dose of 100 mg 3 times a day once he feels back to his normal self. Recommend that he returns to the emergency room if new symptoms develop such as strokelike symptoms or chest pain. Recommend he follow up with his primary care doctor on Monday for reevaluation.  [CV]    Clinical Course User Index [CV] Nita Sickle, MD    Pertinent labs & imaging results that were available during my care of the patient were reviewed by me and considered in my medical decision making (see chart for details).    ____________________________________________   FINAL CLINICAL IMPRESSION(S) / ED DIAGNOSES  Final diagnoses:  Dizziness  Adverse effect of drug, initial encounter      NEW MEDICATIONS STARTED DURING THIS VISIT:  New Prescriptions   GABAPENTIN (NEURONTIN) 100 MG CAPSULE    Take 1 capsule (100 mg total) by mouth 3 (three) times daily.     Note:  This document was prepared using Dragon  voice recognition software and may include unintentional dictation errors.    Nita Sickle, MD 08/31/16 [phone]

## 2016-08-31 NOTE — Discharge Instructions (Signed)
Stop taking gabapentin. Drink plenty of fluids to help clear this medicine off of your system. Once you feel back to normal restart on the new dose of 100 mg 3 times a day. Follow up with your primary care doctor Monday for further evaluation. Return to the emergency room if you have chest pain, facial droop, she elected to pass out, weakness or numbness of the extremities, or any new symptoms that were not present today.

## 2016-09-01 DIAGNOSIS — E1165 Type 2 diabetes mellitus with hyperglycemia: Secondary | ICD-10-CM | POA: Diagnosis not present

## 2016-09-01 DIAGNOSIS — I110 Hypertensive heart disease with heart failure: Secondary | ICD-10-CM | POA: Diagnosis not present

## 2016-09-01 DIAGNOSIS — E1140 Type 2 diabetes mellitus with diabetic neuropathy, unspecified: Secondary | ICD-10-CM | POA: Diagnosis not present

## 2016-09-01 DIAGNOSIS — I2510 Atherosclerotic heart disease of native coronary artery without angina pectoris: Secondary | ICD-10-CM | POA: Diagnosis not present

## 2016-09-01 DIAGNOSIS — I509 Heart failure, unspecified: Secondary | ICD-10-CM | POA: Diagnosis not present

## 2016-09-01 DIAGNOSIS — J449 Chronic obstructive pulmonary disease, unspecified: Secondary | ICD-10-CM | POA: Diagnosis not present

## 2016-09-04 DIAGNOSIS — I509 Heart failure, unspecified: Secondary | ICD-10-CM | POA: Diagnosis not present

## 2016-09-04 DIAGNOSIS — E1140 Type 2 diabetes mellitus with diabetic neuropathy, unspecified: Secondary | ICD-10-CM | POA: Diagnosis not present

## 2016-09-04 DIAGNOSIS — E1165 Type 2 diabetes mellitus with hyperglycemia: Secondary | ICD-10-CM | POA: Diagnosis not present

## 2016-09-04 DIAGNOSIS — I2510 Atherosclerotic heart disease of native coronary artery without angina pectoris: Secondary | ICD-10-CM | POA: Diagnosis not present

## 2016-09-04 DIAGNOSIS — J449 Chronic obstructive pulmonary disease, unspecified: Secondary | ICD-10-CM | POA: Diagnosis not present

## 2016-09-04 DIAGNOSIS — I110 Hypertensive heart disease with heart failure: Secondary | ICD-10-CM | POA: Diagnosis not present

## 2016-09-08 DIAGNOSIS — I2510 Atherosclerotic heart disease of native coronary artery without angina pectoris: Secondary | ICD-10-CM | POA: Diagnosis not present

## 2016-09-08 DIAGNOSIS — E1165 Type 2 diabetes mellitus with hyperglycemia: Secondary | ICD-10-CM | POA: Diagnosis not present

## 2016-09-08 DIAGNOSIS — E1140 Type 2 diabetes mellitus with diabetic neuropathy, unspecified: Secondary | ICD-10-CM | POA: Diagnosis not present

## 2016-09-08 DIAGNOSIS — I110 Hypertensive heart disease with heart failure: Secondary | ICD-10-CM | POA: Diagnosis not present

## 2016-09-08 DIAGNOSIS — I509 Heart failure, unspecified: Secondary | ICD-10-CM | POA: Diagnosis not present

## 2016-09-08 DIAGNOSIS — J449 Chronic obstructive pulmonary disease, unspecified: Secondary | ICD-10-CM | POA: Diagnosis not present

## 2016-09-10 DIAGNOSIS — E119 Type 2 diabetes mellitus without complications: Secondary | ICD-10-CM | POA: Diagnosis not present

## 2016-09-11 DIAGNOSIS — I110 Hypertensive heart disease with heart failure: Secondary | ICD-10-CM | POA: Diagnosis not present

## 2016-09-11 DIAGNOSIS — E1165 Type 2 diabetes mellitus with hyperglycemia: Secondary | ICD-10-CM | POA: Diagnosis not present

## 2016-09-11 DIAGNOSIS — E1140 Type 2 diabetes mellitus with diabetic neuropathy, unspecified: Secondary | ICD-10-CM | POA: Diagnosis not present

## 2016-09-11 DIAGNOSIS — I509 Heart failure, unspecified: Secondary | ICD-10-CM | POA: Diagnosis not present

## 2016-09-11 DIAGNOSIS — I2510 Atherosclerotic heart disease of native coronary artery without angina pectoris: Secondary | ICD-10-CM | POA: Diagnosis not present

## 2016-09-11 DIAGNOSIS — J449 Chronic obstructive pulmonary disease, unspecified: Secondary | ICD-10-CM | POA: Diagnosis not present

## 2016-09-13 DIAGNOSIS — I2510 Atherosclerotic heart disease of native coronary artery without angina pectoris: Secondary | ICD-10-CM | POA: Diagnosis not present

## 2016-09-13 DIAGNOSIS — J449 Chronic obstructive pulmonary disease, unspecified: Secondary | ICD-10-CM | POA: Diagnosis not present

## 2016-09-13 DIAGNOSIS — I110 Hypertensive heart disease with heart failure: Secondary | ICD-10-CM | POA: Diagnosis not present

## 2016-09-13 DIAGNOSIS — E1140 Type 2 diabetes mellitus with diabetic neuropathy, unspecified: Secondary | ICD-10-CM | POA: Diagnosis not present

## 2016-09-13 DIAGNOSIS — E1165 Type 2 diabetes mellitus with hyperglycemia: Secondary | ICD-10-CM | POA: Diagnosis not present

## 2016-09-13 DIAGNOSIS — I509 Heart failure, unspecified: Secondary | ICD-10-CM | POA: Diagnosis not present

## 2016-09-14 ENCOUNTER — Encounter: Payer: Self-pay | Admitting: *Deleted

## 2016-09-14 ENCOUNTER — Ambulatory Visit: Payer: Medicare Other | Admitting: Cardiovascular Disease

## 2016-09-17 ENCOUNTER — Institutional Professional Consult (permissible substitution): Payer: Medicare Other | Admitting: Pulmonary Disease

## 2016-09-18 DIAGNOSIS — E1140 Type 2 diabetes mellitus with diabetic neuropathy, unspecified: Secondary | ICD-10-CM | POA: Diagnosis not present

## 2016-09-18 DIAGNOSIS — I110 Hypertensive heart disease with heart failure: Secondary | ICD-10-CM | POA: Diagnosis not present

## 2016-09-18 DIAGNOSIS — I2510 Atherosclerotic heart disease of native coronary artery without angina pectoris: Secondary | ICD-10-CM | POA: Diagnosis not present

## 2016-09-18 DIAGNOSIS — I509 Heart failure, unspecified: Secondary | ICD-10-CM | POA: Diagnosis not present

## 2016-09-18 DIAGNOSIS — J449 Chronic obstructive pulmonary disease, unspecified: Secondary | ICD-10-CM | POA: Diagnosis not present

## 2016-09-18 DIAGNOSIS — E1165 Type 2 diabetes mellitus with hyperglycemia: Secondary | ICD-10-CM | POA: Diagnosis not present

## 2016-09-19 DIAGNOSIS — I509 Heart failure, unspecified: Secondary | ICD-10-CM | POA: Diagnosis not present

## 2016-09-19 DIAGNOSIS — I2510 Atherosclerotic heart disease of native coronary artery without angina pectoris: Secondary | ICD-10-CM | POA: Diagnosis not present

## 2016-09-19 DIAGNOSIS — I110 Hypertensive heart disease with heart failure: Secondary | ICD-10-CM | POA: Diagnosis not present

## 2016-09-19 DIAGNOSIS — E1140 Type 2 diabetes mellitus with diabetic neuropathy, unspecified: Secondary | ICD-10-CM | POA: Diagnosis not present

## 2016-09-19 DIAGNOSIS — J449 Chronic obstructive pulmonary disease, unspecified: Secondary | ICD-10-CM | POA: Diagnosis not present

## 2016-09-19 DIAGNOSIS — E1165 Type 2 diabetes mellitus with hyperglycemia: Secondary | ICD-10-CM | POA: Diagnosis not present

## 2016-09-20 DIAGNOSIS — I509 Heart failure, unspecified: Secondary | ICD-10-CM | POA: Diagnosis not present

## 2016-09-20 DIAGNOSIS — I2510 Atherosclerotic heart disease of native coronary artery without angina pectoris: Secondary | ICD-10-CM | POA: Diagnosis not present

## 2016-09-20 DIAGNOSIS — E1165 Type 2 diabetes mellitus with hyperglycemia: Secondary | ICD-10-CM | POA: Diagnosis not present

## 2016-09-20 DIAGNOSIS — I110 Hypertensive heart disease with heart failure: Secondary | ICD-10-CM | POA: Diagnosis not present

## 2016-09-20 DIAGNOSIS — J449 Chronic obstructive pulmonary disease, unspecified: Secondary | ICD-10-CM | POA: Diagnosis not present

## 2016-09-20 DIAGNOSIS — E1140 Type 2 diabetes mellitus with diabetic neuropathy, unspecified: Secondary | ICD-10-CM | POA: Diagnosis not present

## 2016-09-25 DIAGNOSIS — E1140 Type 2 diabetes mellitus with diabetic neuropathy, unspecified: Secondary | ICD-10-CM | POA: Diagnosis not present

## 2016-09-25 DIAGNOSIS — J449 Chronic obstructive pulmonary disease, unspecified: Secondary | ICD-10-CM | POA: Diagnosis not present

## 2016-09-25 DIAGNOSIS — I110 Hypertensive heart disease with heart failure: Secondary | ICD-10-CM | POA: Diagnosis not present

## 2016-09-25 DIAGNOSIS — I2510 Atherosclerotic heart disease of native coronary artery without angina pectoris: Secondary | ICD-10-CM | POA: Diagnosis not present

## 2016-09-25 DIAGNOSIS — E1165 Type 2 diabetes mellitus with hyperglycemia: Secondary | ICD-10-CM | POA: Diagnosis not present

## 2016-09-25 DIAGNOSIS — I509 Heart failure, unspecified: Secondary | ICD-10-CM | POA: Diagnosis not present

## 2016-09-26 DIAGNOSIS — I2510 Atherosclerotic heart disease of native coronary artery without angina pectoris: Secondary | ICD-10-CM | POA: Diagnosis not present

## 2016-09-26 DIAGNOSIS — E1165 Type 2 diabetes mellitus with hyperglycemia: Secondary | ICD-10-CM | POA: Diagnosis not present

## 2016-09-26 DIAGNOSIS — J449 Chronic obstructive pulmonary disease, unspecified: Secondary | ICD-10-CM | POA: Diagnosis not present

## 2016-09-26 DIAGNOSIS — E1140 Type 2 diabetes mellitus with diabetic neuropathy, unspecified: Secondary | ICD-10-CM | POA: Diagnosis not present

## 2016-09-26 DIAGNOSIS — I509 Heart failure, unspecified: Secondary | ICD-10-CM | POA: Diagnosis not present

## 2016-09-26 DIAGNOSIS — I110 Hypertensive heart disease with heart failure: Secondary | ICD-10-CM | POA: Diagnosis not present

## 2016-10-31 DIAGNOSIS — J449 Chronic obstructive pulmonary disease, unspecified: Secondary | ICD-10-CM | POA: Diagnosis not present

## 2016-10-31 DIAGNOSIS — E7800 Pure hypercholesterolemia, unspecified: Secondary | ICD-10-CM | POA: Diagnosis not present

## 2016-10-31 DIAGNOSIS — E559 Vitamin D deficiency, unspecified: Secondary | ICD-10-CM | POA: Diagnosis not present

## 2016-10-31 DIAGNOSIS — E784 Other hyperlipidemia: Secondary | ICD-10-CM | POA: Diagnosis not present

## 2016-10-31 DIAGNOSIS — E1143 Type 2 diabetes mellitus with diabetic autonomic (poly)neuropathy: Secondary | ICD-10-CM | POA: Diagnosis not present

## 2016-10-31 DIAGNOSIS — R5381 Other malaise: Secondary | ICD-10-CM | POA: Diagnosis not present

## 2016-10-31 DIAGNOSIS — Z79899 Other long term (current) drug therapy: Secondary | ICD-10-CM | POA: Diagnosis not present

## 2016-10-31 DIAGNOSIS — E119 Type 2 diabetes mellitus without complications: Secondary | ICD-10-CM | POA: Diagnosis not present

## 2016-10-31 DIAGNOSIS — I10 Essential (primary) hypertension: Secondary | ICD-10-CM | POA: Diagnosis not present

## 2016-11-17 ENCOUNTER — Emergency Department
Admission: EM | Admit: 2016-11-17 | Discharge: 2016-11-17 | Disposition: A | Discharge status: Home / Self Care | Payer: Medicare Other | Attending: Emergency Medicine | Admitting: Emergency Medicine

## 2016-11-17 DIAGNOSIS — Y929 Unspecified place or not applicable: Secondary | ICD-10-CM | POA: Diagnosis not present

## 2016-11-17 DIAGNOSIS — Z5321 Procedure and treatment not carried out due to patient leaving prior to being seen by health care provider: Secondary | ICD-10-CM | POA: Diagnosis not present

## 2016-11-17 DIAGNOSIS — Y999 Unspecified external cause status: Secondary | ICD-10-CM | POA: Diagnosis not present

## 2016-11-17 DIAGNOSIS — Z794 Long term (current) use of insulin: Secondary | ICD-10-CM | POA: Diagnosis not present

## 2016-11-17 DIAGNOSIS — E119 Type 2 diabetes mellitus without complications: Secondary | ICD-10-CM | POA: Insufficient documentation

## 2016-11-17 DIAGNOSIS — X58XXXA Exposure to other specified factors, initial encounter: Secondary | ICD-10-CM | POA: Diagnosis not present

## 2016-11-17 DIAGNOSIS — Z87891 Personal history of nicotine dependence: Secondary | ICD-10-CM | POA: Diagnosis not present

## 2016-11-17 DIAGNOSIS — I110 Hypertensive heart disease with heart failure: Secondary | ICD-10-CM | POA: Diagnosis not present

## 2016-11-17 DIAGNOSIS — I509 Heart failure, unspecified: Secondary | ICD-10-CM | POA: Insufficient documentation

## 2016-11-17 DIAGNOSIS — J449 Chronic obstructive pulmonary disease, unspecified: Secondary | ICD-10-CM | POA: Diagnosis not present

## 2016-11-17 DIAGNOSIS — Y939 Activity, unspecified: Secondary | ICD-10-CM | POA: Insufficient documentation

## 2016-11-17 DIAGNOSIS — Z7982 Long term (current) use of aspirin: Secondary | ICD-10-CM | POA: Diagnosis not present

## 2016-11-17 DIAGNOSIS — S0991XA Unspecified injury of ear, initial encounter: Secondary | ICD-10-CM | POA: Insufficient documentation

## 2016-11-17 DIAGNOSIS — I2510 Atherosclerotic heart disease of native coronary artery without angina pectoris: Secondary | ICD-10-CM | POA: Insufficient documentation

## 2016-11-17 DIAGNOSIS — Z79899 Other long term (current) drug therapy: Secondary | ICD-10-CM | POA: Diagnosis not present

## 2016-11-17 NOTE — ED Triage Notes (Addendum)
Reports grandson accidentally scratched his left ear and he hasn't been able to get it to stop.  Reports that he takes blood thinner (Plavix).

## 2016-12-05 DIAGNOSIS — Z79899 Other long term (current) drug therapy: Secondary | ICD-10-CM | POA: Diagnosis not present

## 2016-12-05 DIAGNOSIS — I10 Essential (primary) hypertension: Secondary | ICD-10-CM | POA: Diagnosis not present

## 2016-12-05 DIAGNOSIS — M129 Arthropathy, unspecified: Secondary | ICD-10-CM | POA: Diagnosis not present

## 2016-12-05 DIAGNOSIS — Z79891 Long term (current) use of opiate analgesic: Secondary | ICD-10-CM | POA: Diagnosis not present

## 2016-12-05 DIAGNOSIS — E1143 Type 2 diabetes mellitus with diabetic autonomic (poly)neuropathy: Secondary | ICD-10-CM | POA: Diagnosis not present

## 2016-12-05 DIAGNOSIS — M1990 Unspecified osteoarthritis, unspecified site: Secondary | ICD-10-CM | POA: Diagnosis not present

## 2016-12-05 DIAGNOSIS — E119 Type 2 diabetes mellitus without complications: Secondary | ICD-10-CM | POA: Diagnosis not present

## 2016-12-05 DIAGNOSIS — J449 Chronic obstructive pulmonary disease, unspecified: Secondary | ICD-10-CM | POA: Diagnosis not present

## 2016-12-24 DIAGNOSIS — Z87891 Personal history of nicotine dependence: Secondary | ICD-10-CM | POA: Diagnosis not present

## 2016-12-24 DIAGNOSIS — M129 Arthropathy, unspecified: Secondary | ICD-10-CM | POA: Diagnosis not present

## 2016-12-24 DIAGNOSIS — I110 Hypertensive heart disease with heart failure: Secondary | ICD-10-CM | POA: Diagnosis not present

## 2016-12-24 DIAGNOSIS — I2510 Atherosclerotic heart disease of native coronary artery without angina pectoris: Secondary | ICD-10-CM | POA: Diagnosis not present

## 2016-12-24 DIAGNOSIS — E1143 Type 2 diabetes mellitus with diabetic autonomic (poly)neuropathy: Secondary | ICD-10-CM | POA: Diagnosis not present

## 2016-12-24 DIAGNOSIS — E049 Nontoxic goiter, unspecified: Secondary | ICD-10-CM | POA: Diagnosis not present

## 2016-12-24 DIAGNOSIS — F329 Major depressive disorder, single episode, unspecified: Secondary | ICD-10-CM | POA: Diagnosis not present

## 2016-12-24 DIAGNOSIS — F419 Anxiety disorder, unspecified: Secondary | ICD-10-CM | POA: Diagnosis not present

## 2016-12-24 DIAGNOSIS — E784 Other hyperlipidemia: Secondary | ICD-10-CM | POA: Diagnosis not present

## 2016-12-24 DIAGNOSIS — M810 Age-related osteoporosis without current pathological fracture: Secondary | ICD-10-CM | POA: Diagnosis not present

## 2016-12-24 DIAGNOSIS — I509 Heart failure, unspecified: Secondary | ICD-10-CM | POA: Diagnosis not present

## 2016-12-24 DIAGNOSIS — E669 Obesity, unspecified: Secondary | ICD-10-CM | POA: Diagnosis not present

## 2016-12-24 DIAGNOSIS — Z794 Long term (current) use of insulin: Secondary | ICD-10-CM | POA: Diagnosis not present

## 2016-12-24 DIAGNOSIS — K219 Gastro-esophageal reflux disease without esophagitis: Secondary | ICD-10-CM | POA: Diagnosis not present

## 2016-12-24 DIAGNOSIS — J449 Chronic obstructive pulmonary disease, unspecified: Secondary | ICD-10-CM | POA: Diagnosis not present

## 2017-01-02 DIAGNOSIS — J449 Chronic obstructive pulmonary disease, unspecified: Secondary | ICD-10-CM | POA: Diagnosis not present

## 2017-01-02 DIAGNOSIS — I110 Hypertensive heart disease with heart failure: Secondary | ICD-10-CM | POA: Diagnosis not present

## 2017-01-02 DIAGNOSIS — M129 Arthropathy, unspecified: Secondary | ICD-10-CM | POA: Diagnosis not present

## 2017-01-02 DIAGNOSIS — E1143 Type 2 diabetes mellitus with diabetic autonomic (poly)neuropathy: Secondary | ICD-10-CM | POA: Diagnosis not present

## 2017-01-02 DIAGNOSIS — I509 Heart failure, unspecified: Secondary | ICD-10-CM | POA: Diagnosis not present

## 2017-01-02 DIAGNOSIS — I2510 Atherosclerotic heart disease of native coronary artery without angina pectoris: Secondary | ICD-10-CM | POA: Diagnosis not present

## 2017-01-03 DIAGNOSIS — J449 Chronic obstructive pulmonary disease, unspecified: Secondary | ICD-10-CM | POA: Diagnosis not present

## 2017-01-03 DIAGNOSIS — I509 Heart failure, unspecified: Secondary | ICD-10-CM | POA: Diagnosis not present

## 2017-01-03 DIAGNOSIS — I2510 Atherosclerotic heart disease of native coronary artery without angina pectoris: Secondary | ICD-10-CM | POA: Diagnosis not present

## 2017-01-03 DIAGNOSIS — I110 Hypertensive heart disease with heart failure: Secondary | ICD-10-CM | POA: Diagnosis not present

## 2017-01-03 DIAGNOSIS — E1143 Type 2 diabetes mellitus with diabetic autonomic (poly)neuropathy: Secondary | ICD-10-CM | POA: Diagnosis not present

## 2017-01-03 DIAGNOSIS — M129 Arthropathy, unspecified: Secondary | ICD-10-CM | POA: Diagnosis not present

## 2017-01-08 DIAGNOSIS — E1143 Type 2 diabetes mellitus with diabetic autonomic (poly)neuropathy: Secondary | ICD-10-CM | POA: Diagnosis not present

## 2017-01-08 DIAGNOSIS — I2510 Atherosclerotic heart disease of native coronary artery without angina pectoris: Secondary | ICD-10-CM | POA: Diagnosis not present

## 2017-01-08 DIAGNOSIS — I110 Hypertensive heart disease with heart failure: Secondary | ICD-10-CM | POA: Diagnosis not present

## 2017-01-08 DIAGNOSIS — M129 Arthropathy, unspecified: Secondary | ICD-10-CM | POA: Diagnosis not present

## 2017-01-08 DIAGNOSIS — I509 Heart failure, unspecified: Secondary | ICD-10-CM | POA: Diagnosis not present

## 2017-01-08 DIAGNOSIS — J449 Chronic obstructive pulmonary disease, unspecified: Secondary | ICD-10-CM | POA: Diagnosis not present

## 2017-01-09 DIAGNOSIS — E7800 Pure hypercholesterolemia, unspecified: Secondary | ICD-10-CM | POA: Diagnosis not present

## 2017-01-09 DIAGNOSIS — I10 Essential (primary) hypertension: Secondary | ICD-10-CM | POA: Diagnosis not present

## 2017-01-09 DIAGNOSIS — E1143 Type 2 diabetes mellitus with diabetic autonomic (poly)neuropathy: Secondary | ICD-10-CM | POA: Diagnosis not present

## 2017-01-09 DIAGNOSIS — E119 Type 2 diabetes mellitus without complications: Secondary | ICD-10-CM | POA: Diagnosis not present

## 2017-01-10 DIAGNOSIS — I509 Heart failure, unspecified: Secondary | ICD-10-CM | POA: Diagnosis not present

## 2017-01-10 DIAGNOSIS — E1143 Type 2 diabetes mellitus with diabetic autonomic (poly)neuropathy: Secondary | ICD-10-CM | POA: Diagnosis not present

## 2017-01-10 DIAGNOSIS — J449 Chronic obstructive pulmonary disease, unspecified: Secondary | ICD-10-CM | POA: Diagnosis not present

## 2017-01-10 DIAGNOSIS — M129 Arthropathy, unspecified: Secondary | ICD-10-CM | POA: Diagnosis not present

## 2017-01-10 DIAGNOSIS — I110 Hypertensive heart disease with heart failure: Secondary | ICD-10-CM | POA: Diagnosis not present

## 2017-01-10 DIAGNOSIS — I2510 Atherosclerotic heart disease of native coronary artery without angina pectoris: Secondary | ICD-10-CM | POA: Diagnosis not present

## 2017-01-16 DIAGNOSIS — I110 Hypertensive heart disease with heart failure: Secondary | ICD-10-CM | POA: Diagnosis not present

## 2017-01-16 DIAGNOSIS — I509 Heart failure, unspecified: Secondary | ICD-10-CM | POA: Diagnosis not present

## 2017-01-16 DIAGNOSIS — I2510 Atherosclerotic heart disease of native coronary artery without angina pectoris: Secondary | ICD-10-CM | POA: Diagnosis not present

## 2017-01-16 DIAGNOSIS — E1143 Type 2 diabetes mellitus with diabetic autonomic (poly)neuropathy: Secondary | ICD-10-CM | POA: Diagnosis not present

## 2017-01-16 DIAGNOSIS — M129 Arthropathy, unspecified: Secondary | ICD-10-CM | POA: Diagnosis not present

## 2017-01-16 DIAGNOSIS — J449 Chronic obstructive pulmonary disease, unspecified: Secondary | ICD-10-CM | POA: Diagnosis not present

## 2017-01-18 DIAGNOSIS — E1143 Type 2 diabetes mellitus with diabetic autonomic (poly)neuropathy: Secondary | ICD-10-CM | POA: Diagnosis not present

## 2017-01-18 DIAGNOSIS — M129 Arthropathy, unspecified: Secondary | ICD-10-CM | POA: Diagnosis not present

## 2017-01-18 DIAGNOSIS — J449 Chronic obstructive pulmonary disease, unspecified: Secondary | ICD-10-CM | POA: Diagnosis not present

## 2017-01-18 DIAGNOSIS — I509 Heart failure, unspecified: Secondary | ICD-10-CM | POA: Diagnosis not present

## 2017-01-18 DIAGNOSIS — I110 Hypertensive heart disease with heart failure: Secondary | ICD-10-CM | POA: Diagnosis not present

## 2017-01-18 DIAGNOSIS — I2510 Atherosclerotic heart disease of native coronary artery without angina pectoris: Secondary | ICD-10-CM | POA: Diagnosis not present

## 2017-01-23 DIAGNOSIS — I110 Hypertensive heart disease with heart failure: Secondary | ICD-10-CM | POA: Diagnosis not present

## 2017-01-23 DIAGNOSIS — E1143 Type 2 diabetes mellitus with diabetic autonomic (poly)neuropathy: Secondary | ICD-10-CM | POA: Diagnosis not present

## 2017-01-23 DIAGNOSIS — J449 Chronic obstructive pulmonary disease, unspecified: Secondary | ICD-10-CM | POA: Diagnosis not present

## 2017-01-23 DIAGNOSIS — I2510 Atherosclerotic heart disease of native coronary artery without angina pectoris: Secondary | ICD-10-CM | POA: Diagnosis not present

## 2017-01-23 DIAGNOSIS — I509 Heart failure, unspecified: Secondary | ICD-10-CM | POA: Diagnosis not present

## 2017-01-23 DIAGNOSIS — M129 Arthropathy, unspecified: Secondary | ICD-10-CM | POA: Diagnosis not present

## 2017-01-28 DIAGNOSIS — E1151 Type 2 diabetes mellitus with diabetic peripheral angiopathy without gangrene: Secondary | ICD-10-CM | POA: Diagnosis not present

## 2017-01-28 DIAGNOSIS — Z794 Long term (current) use of insulin: Secondary | ICD-10-CM | POA: Diagnosis not present

## 2017-01-28 DIAGNOSIS — Z8719 Personal history of other diseases of the digestive system: Secondary | ICD-10-CM | POA: Diagnosis not present

## 2017-01-28 DIAGNOSIS — G8929 Other chronic pain: Secondary | ICD-10-CM | POA: Diagnosis not present

## 2017-01-28 DIAGNOSIS — M545 Low back pain: Secondary | ICD-10-CM | POA: Diagnosis not present

## 2017-01-28 DIAGNOSIS — K219 Gastro-esophageal reflux disease without esophagitis: Secondary | ICD-10-CM | POA: Diagnosis not present

## 2017-01-28 DIAGNOSIS — E119 Type 2 diabetes mellitus without complications: Secondary | ICD-10-CM | POA: Diagnosis not present

## 2017-01-28 DIAGNOSIS — I213 ST elevation (STEMI) myocardial infarction of unspecified site: Secondary | ICD-10-CM | POA: Diagnosis not present

## 2017-01-28 DIAGNOSIS — N184 Chronic kidney disease, stage 4 (severe): Secondary | ICD-10-CM | POA: Diagnosis not present

## 2017-01-30 DIAGNOSIS — J449 Chronic obstructive pulmonary disease, unspecified: Secondary | ICD-10-CM | POA: Diagnosis not present

## 2017-01-30 DIAGNOSIS — E1143 Type 2 diabetes mellitus with diabetic autonomic (poly)neuropathy: Secondary | ICD-10-CM | POA: Diagnosis not present

## 2017-01-30 DIAGNOSIS — I2510 Atherosclerotic heart disease of native coronary artery without angina pectoris: Secondary | ICD-10-CM | POA: Diagnosis not present

## 2017-01-30 DIAGNOSIS — I110 Hypertensive heart disease with heart failure: Secondary | ICD-10-CM | POA: Diagnosis not present

## 2017-01-30 DIAGNOSIS — M129 Arthropathy, unspecified: Secondary | ICD-10-CM | POA: Diagnosis not present

## 2017-01-30 DIAGNOSIS — I509 Heart failure, unspecified: Secondary | ICD-10-CM | POA: Diagnosis not present

## 2017-02-04 ENCOUNTER — Telehealth: Payer: Self-pay | Admitting: Cardiovascular Disease

## 2017-02-05 DIAGNOSIS — M129 Arthropathy, unspecified: Secondary | ICD-10-CM | POA: Diagnosis not present

## 2017-02-05 DIAGNOSIS — I2510 Atherosclerotic heart disease of native coronary artery without angina pectoris: Secondary | ICD-10-CM | POA: Diagnosis not present

## 2017-02-05 DIAGNOSIS — E1143 Type 2 diabetes mellitus with diabetic autonomic (poly)neuropathy: Secondary | ICD-10-CM | POA: Diagnosis not present

## 2017-02-05 DIAGNOSIS — J449 Chronic obstructive pulmonary disease, unspecified: Secondary | ICD-10-CM | POA: Diagnosis not present

## 2017-02-05 DIAGNOSIS — I509 Heart failure, unspecified: Secondary | ICD-10-CM | POA: Diagnosis not present

## 2017-02-05 DIAGNOSIS — I110 Hypertensive heart disease with heart failure: Secondary | ICD-10-CM | POA: Diagnosis not present

## 2017-02-05 NOTE — Telephone Encounter (Signed)
Patient requested we refer him to primary care office as he does not like the current np he is seeing.  Called [facility] and scheduled appt with office. Patient appreciative.

## 2017-02-13 DIAGNOSIS — I509 Heart failure, unspecified: Secondary | ICD-10-CM | POA: Diagnosis not present

## 2017-02-13 DIAGNOSIS — M129 Arthropathy, unspecified: Secondary | ICD-10-CM | POA: Diagnosis not present

## 2017-02-13 DIAGNOSIS — I110 Hypertensive heart disease with heart failure: Secondary | ICD-10-CM | POA: Diagnosis not present

## 2017-02-13 DIAGNOSIS — E1143 Type 2 diabetes mellitus with diabetic autonomic (poly)neuropathy: Secondary | ICD-10-CM | POA: Diagnosis not present

## 2017-02-13 DIAGNOSIS — I2510 Atherosclerotic heart disease of native coronary artery without angina pectoris: Secondary | ICD-10-CM | POA: Diagnosis not present

## 2017-02-13 DIAGNOSIS — J449 Chronic obstructive pulmonary disease, unspecified: Secondary | ICD-10-CM | POA: Diagnosis not present

## 2017-02-19 DIAGNOSIS — I110 Hypertensive heart disease with heart failure: Secondary | ICD-10-CM | POA: Diagnosis not present

## 2017-02-19 DIAGNOSIS — M129 Arthropathy, unspecified: Secondary | ICD-10-CM | POA: Diagnosis not present

## 2017-02-19 DIAGNOSIS — J449 Chronic obstructive pulmonary disease, unspecified: Secondary | ICD-10-CM | POA: Diagnosis not present

## 2017-02-19 DIAGNOSIS — I2510 Atherosclerotic heart disease of native coronary artery without angina pectoris: Secondary | ICD-10-CM | POA: Diagnosis not present

## 2017-02-19 DIAGNOSIS — E1143 Type 2 diabetes mellitus with diabetic autonomic (poly)neuropathy: Secondary | ICD-10-CM | POA: Diagnosis not present

## 2017-02-19 DIAGNOSIS — I509 Heart failure, unspecified: Secondary | ICD-10-CM | POA: Diagnosis not present

## 2017-03-05 ENCOUNTER — Encounter: Payer: Self-pay | Admitting: Family Medicine

## 2017-03-05 ENCOUNTER — Ambulatory Visit (INDEPENDENT_AMBULATORY_CARE_PROVIDER_SITE_OTHER): Payer: Medicare Other | Admitting: Family Medicine

## 2017-03-05 ENCOUNTER — Other Ambulatory Visit: Payer: Self-pay

## 2017-03-05 VITALS — BP 118/84 | HR 69 | Temp 98.5°F | Ht 71.0 in | Wt 244.0 lb

## 2017-03-05 DIAGNOSIS — I2510 Atherosclerotic heart disease of native coronary artery without angina pectoris: Secondary | ICD-10-CM | POA: Diagnosis not present

## 2017-03-05 DIAGNOSIS — E1142 Type 2 diabetes mellitus with diabetic polyneuropathy: Secondary | ICD-10-CM

## 2017-03-05 DIAGNOSIS — Z794 Long term (current) use of insulin: Secondary | ICD-10-CM | POA: Diagnosis not present

## 2017-03-05 DIAGNOSIS — I10 Essential (primary) hypertension: Secondary | ICD-10-CM | POA: Diagnosis not present

## 2017-03-05 DIAGNOSIS — E1165 Type 2 diabetes mellitus with hyperglycemia: Secondary | ICD-10-CM

## 2017-03-05 DIAGNOSIS — N184 Chronic kidney disease, stage 4 (severe): Secondary | ICD-10-CM | POA: Insufficient documentation

## 2017-03-05 DIAGNOSIS — N183 Chronic kidney disease, stage 3 unspecified: Secondary | ICD-10-CM

## 2017-03-05 DIAGNOSIS — I255 Ischemic cardiomyopathy: Secondary | ICD-10-CM | POA: Diagnosis not present

## 2017-03-05 DIAGNOSIS — IMO0002 Reserved for concepts with insufficient information to code with codable children: Secondary | ICD-10-CM

## 2017-03-05 DIAGNOSIS — I5042 Chronic combined systolic (congestive) and diastolic (congestive) heart failure: Secondary | ICD-10-CM

## 2017-03-05 DIAGNOSIS — E118 Type 2 diabetes mellitus with unspecified complications: Secondary | ICD-10-CM | POA: Diagnosis not present

## 2017-03-05 DIAGNOSIS — I5032 Chronic diastolic (congestive) heart failure: Secondary | ICD-10-CM | POA: Insufficient documentation

## 2017-03-05 DIAGNOSIS — E7800 Pure hypercholesterolemia, unspecified: Secondary | ICD-10-CM | POA: Diagnosis not present

## 2017-03-05 DIAGNOSIS — I509 Heart failure, unspecified: Secondary | ICD-10-CM | POA: Insufficient documentation

## 2017-03-05 LAB — COMPREHENSIVE METABOLIC PANEL
ALT: 33 U/L (ref 0–53)
AST: 22 U/L (ref 0–37)
Albumin: 4.1 g/dL (ref 3.5–5.2)
Alkaline Phosphatase: 89 U/L (ref 39–117)
BUN: 32 mg/dL — ABNORMAL HIGH (ref 6–23)
CO2: 26 mEq/L (ref 19–32)
Calcium: 9.2 mg/dL (ref 8.4–10.5)
Chloride: 107 mEq/L (ref 96–112)
Creatinine, Ser: 1.44 mg/dL (ref 0.40–1.50)
GFR: 52.15 mL/min — ABNORMAL LOW (ref >60.00)
Glucose, Bld: 81 mg/dL (ref 70–99)
Potassium: 4.1 mEq/L (ref 3.5–5.1)
Sodium: 140 mEq/L (ref 135–145)
Total Bilirubin: 0.4 mg/dL (ref 0.2–1.2)
Total Protein: 7 g/dL (ref 6.0–8.3)

## 2017-03-05 LAB — CBC
HCT: 47.6 % (ref 39.0–52.0)
Hemoglobin: 15.2 g/dL (ref 13.0–17.0)
MCHC: 32 g/dL (ref 30.0–36.0)
MCV: 96.9 fl (ref 78.0–100.0)
Platelets: 258 10*3/uL (ref 150.0–400.0)
RBC: 4.91 Mil/uL (ref 4.22–5.81)
RDW: 13.6 % (ref 11.5–15.5)
WBC: 7.8 10*3/uL (ref 4.0–10.5)

## 2017-03-05 LAB — LIPID PANEL
Cholesterol: 92 mg/dL (ref 0–200)
HDL: 42.1 mg/dL (ref >39.00)
LDL Cholesterol: 35 mg/dL (ref 0–99)
NonHDL: 50.11
Total CHOL/HDL Ratio: 2
Triglycerides: 78 mg/dL (ref 0.0–149.0)
VLDL: 15.6 mg/dL (ref 0.0–40.0)

## 2017-03-05 LAB — HEMOGLOBIN A1C: Hgb A1c MFr Bld: 9.6 % — ABNORMAL HIGH (ref 4.6–6.5)

## 2017-03-05 MED ORDER — ATORVASTATIN CALCIUM 80 MG PO TABS: 80.0000 mg | ORAL_TABLET | Freq: Every day | ORAL | 1 refills | Status: DC

## 2017-03-05 MED ORDER — CLOPIDOGREL BISULFATE 75 MG PO TABS: 75.0000 mg | ORAL_TABLET | Freq: Every day | ORAL | 1 refills | Status: DC

## 2017-03-05 MED ORDER — LISINOPRIL 10 MG PO TABS: 10.0000 mg | ORAL_TABLET | Freq: Every day | ORAL | 1 refills | Status: DC

## 2017-03-05 MED ORDER — CARVEDILOL 25 MG PO TABS: 25.0000 mg | ORAL_TABLET | Freq: Two times a day (BID) | ORAL | 11 refills | Status: DC

## 2017-03-05 MED ORDER — GABAPENTIN 300 MG PO CAPS: 300.0000 mg | ORAL_CAPSULE | Freq: Every day | ORAL | 1 refills | Status: DC

## 2017-03-05 MED ORDER — FUROSEMIDE 40 MG PO TABS: 40.0000 mg | ORAL_TABLET | Freq: Every day | ORAL | 1 refills | Status: DC

## 2017-03-05 MED ORDER — CLOPIDOGREL BISULFATE 75 MG PO TABS
75.0000 mg | ORAL_TABLET | Freq: Every day | ORAL | 1 refills | Status: DC
Start: 2017-03-05 — End: 2017-03-05

## 2017-03-05 NOTE — Telephone Encounter (Signed)
Patient calls scripts sent to wrong pharmacy. Will re-send

## 2017-03-05 NOTE — Patient Instructions (Signed)
Continue the meds in your box.  I have added Gabapentin.  Follow up in 1 month.  Take care  Dr. Adriana Simasook

## 2017-03-06 ENCOUNTER — Encounter: Payer: Self-pay | Admitting: Family Medicine

## 2017-03-06 DIAGNOSIS — E1140 Type 2 diabetes mellitus with diabetic neuropathy, unspecified: Secondary | ICD-10-CM | POA: Insufficient documentation

## 2017-03-06 DIAGNOSIS — E1122 Type 2 diabetes mellitus with diabetic chronic kidney disease: Secondary | ICD-10-CM | POA: Insufficient documentation

## 2017-03-06 DIAGNOSIS — N183 Chronic kidney disease, stage 3 unspecified: Secondary | ICD-10-CM | POA: Insufficient documentation

## 2017-03-06 NOTE — Assessment & Plan Note (Signed)
LDL at goal.  Continue Lipitor. 

## 2017-03-06 NOTE — Assessment & Plan Note (Signed)
New problem. This is a source of the patient's numbness and tingling. Treating with gabapentin.

## 2017-03-06 NOTE — Assessment & Plan Note (Signed)
Stable currently. Needs to follow up closely with cardiology. Continue Aspirin, Plavix, Coreg, Lasix, Lisinopril, Imdur.

## 2017-03-06 NOTE — Progress Notes (Signed)
Subjective:  Patient ID: Danny Scrapeobert M Mccrackin Sr., male    DOB: 1951/05/03  Age: 66 y.o. MRN: [id number]  CC: Establish care, Leg pain  HPI Danny FriendsRobert M Krzywicki Sr. is a 66 y.o. male with an extensive PMH (see below) presents to establish care. Issues/concerns are below.  CAD and CHF  Followed by cardiology.  Most recent catheterization was at the end of 2017. There is no obstructive disease of the main arteries at that time. EF 25-35%.  He is currently on aspirin, Plavix, Coreg, Lasix, Lisinopril, Imdur.  Has baseline shortness of breath.  No chest pain.  DM 2  Uncontrolled.  He is currently on Tresiba 40 units, Jardiance, Glipizide.  Fastings uncontrolled.  Hypertension  Stable on Carvedilol, Lasix, lisinopril.  Hyperlipidemia  Needs lipid panel.  Is currently on Lipitor 80 mg daily.  Leg numbness and tingling  Chronic.  Patient states that this is severe.  Keeps him up at night.  He states that he was on narcotics previously.  He is requesting something for this today.  PMH, Surgical Hx, Family Hx, Social History reviewed and updated as below.  Past Medical History:  Diagnosis Date  . CAD (coronary artery disease)   . CHF (congestive heart failure) (HCC)   . COPD (chronic obstructive pulmonary disease) (HCC)   . DM2 (diabetes mellitus, type 2) (HCC)   . HLD (hyperlipidemia)   . HTN (hypertension)   . Ischemic cardiomyopathy    Past Surgical History:  Procedure Laterality Date  . CAD: stent to the LAD    . CARDIAC CATHETERIZATION  10/01/2014  . CARDIAC CATHETERIZATION N/A 07/23/2016   Procedure: Left Heart Cath and Coronary Angiography;  Surgeon: Iran OuchMuhammad A Arida, MD;  Location: [facility] INVASIVE CV LAB;  Service: Cardiovascular;  Laterality: N/A;  . CORONARY ANGIOPLASTY WITH STENT PLACEMENT  10/01/2014   Family History  Problem Relation Age of Onset  . Heart attack Father        complications  . Cancer Brother    Social History  Substance Use Topics  .  Smoking status: Former Smoker    Packs/day: 3.00    Years: 0.00    Types: Cigarettes    Quit date: 09/07/2009  . Smokeless tobacco: Never Used     Comment: quit june 2011  . Alcohol use Yes     Comment: every other weekend   Review of Systems  Respiratory: Positive for cough.   Cardiovascular: Negative for chest pain.  Neurological:       Leg numbness/tingling.  All other systems reviewed and are negative.  Objective:   Today's Vitals: BP 118/84 (BP Location: Left Arm, Patient Position: Sitting, Cuff Size: Normal)   Pulse 69   Temp 98.5 F (36.9 C) (Oral)   Ht 5\' 11"  (1.803 m)   Wt 244 lb (110.7 kg)   BMI 34.03 kg/m   Physical Exam  Constitutional: He is oriented to person, place, and time. He appears well-developed. No distress.  HENT:  Head: Normocephalic and atraumatic.  Mouth/Throat: Oropharynx is clear and moist.  Edentulous.  Eyes: Conjunctivae are normal. No scleral icterus.  Neck: Neck supple.  Cardiovascular: Normal rate and regular rhythm.   No murmur heard. Pulmonary/Chest: Effort normal. He has no wheezes. He has no rales.  Abdominal: Soft. He exhibits no distension. There is no tenderness.  Umbilical hernia noted.  Lymphadenopathy:    He has no cervical adenopathy.  Neurological: He is alert and oriented to person, place, and time.  Skin: Skin  is warm. No rash noted.  Psychiatric: He has a normal mood and affect.  Vitals reviewed.  Assessment & Plan:   Problem List Items Addressed This Visit      Cardiovascular and Mediastinum   CAD (coronary artery disease) - Primary    Stable currently. Needs to follow up closely with cardiology. Continue Aspirin, Plavix, Coreg, Lasix, Lisinopril, Imdur.      Relevant Medications   isosorbide mononitrate (IMDUR) 30 MG 24 hr tablet   Chronic combined systolic and diastolic CHF (congestive heart failure) (HCC)    Stable. Euvolemic.       Relevant Medications   isosorbide mononitrate (IMDUR) 30 MG 24 hr  tablet   Essential hypertension    At goal on Coreg, Lasix, Lisinopril.       Relevant Medications   isosorbide mononitrate (IMDUR) 30 MG 24 hr tablet     Endocrine   Diabetes mellitus type 2, uncontrolled, with complications (HCC)    Uncontrolled. A1C returned at 9.6.  Stopped glipizide. Continue Jardiance and Guinea-Bissauresiba. Needs to see my pharmacist. Will likely add GLP1 and increase insulin.      Relevant Medications   TRESIBA FLEXTOUCH 200 UNIT/ML SOPN   Other Relevant Orders   Hemoglobin A1c (Completed)   Diabetic neuropathy (HCC)    New problem. This is a source of the patient's numbness and tingling. Treating with gabapentin.      Relevant Medications   TRESIBA FLEXTOUCH 200 UNIT/ML SOPN     Genitourinary   CKD (chronic kidney disease) stage 3, GFR 30-59 ml/min   Relevant Orders   CBC (Completed)   Comprehensive metabolic panel (Completed)     Other   Hyperlipidemia    LDL at goal. Continue Lipitor.       Relevant Medications   isosorbide mononitrate (IMDUR) 30 MG 24 hr tablet   Other Relevant Orders   Lipid panel (Completed)      Meds ordered this encounter  Medications  . TRESIBA FLEXTOUCH 200 UNIT/ML SOPN  . isosorbide mononitrate (IMDUR) 30 MG 24 hr tablet  . DISCONTD: atorvastatin (LIPITOR) 80 MG tablet  . DISCONTD: clopidogrel (PLAVIX) 75 MG tablet    Sig: Take 1 tablet (75 mg total) by mouth daily.    Dispense:  90 tablet    Refill:  1  . DISCONTD: furosemide (LASIX) 40 MG tablet    Sig: Take 1 tablet (40 mg total) by mouth daily.    Dispense:  90 tablet    Refill:  1  . DISCONTD: lisinopril (PRINIVIL,ZESTRIL) 10 MG tablet    Sig: Take 1 tablet (10 mg total) by mouth daily.    Dispense:  90 tablet    Refill:  1  . DISCONTD: atorvastatin (LIPITOR) 80 MG tablet    Sig: Take 1 tablet (80 mg total) by mouth daily.    Dispense:  90 tablet    Refill:  1  . DISCONTD: gabapentin (NEURONTIN) 300 MG capsule    Sig: Take 1 capsule (300 mg total) by  mouth at bedtime.    Dispense:  90 capsule    Refill:  1  . DISCONTD: furosemide (LASIX) 40 MG tablet    Sig: Take 1 tablet (40 mg total) by mouth daily.    Dispense:  90 tablet    Refill:  1  . DISCONTD: lisinopril (PRINIVIL,ZESTRIL) 10 MG tablet    Sig: Take 1 tablet (10 mg total) by mouth daily.    Dispense:  90 tablet    Refill:  1   Follow-up: Return in about 1 month (around 04/05/2017).  Everlene Other DO [facility]

## 2017-03-06 NOTE — Assessment & Plan Note (Signed)
At goal on Coreg, Lasix, Lisinopril.

## 2017-03-06 NOTE — Assessment & Plan Note (Signed)
Uncontrolled. A1C returned at 9.6.  Stopped glipizide. Continue Jardiance and Guinea-Bissauresiba. Needs to see my pharmacist. Will likely add GLP1 and increase insulin.

## 2017-03-06 NOTE — Assessment & Plan Note (Signed)
Stable. Euvolemic. ?

## 2017-03-18 ENCOUNTER — Ambulatory Visit (INDEPENDENT_AMBULATORY_CARE_PROVIDER_SITE_OTHER): Payer: Medicare Other | Admitting: Pharmacist

## 2017-03-18 ENCOUNTER — Encounter: Payer: Self-pay | Admitting: Pharmacist

## 2017-03-18 VITALS — BP 119/77 | HR 76 | Wt 243.2 lb

## 2017-03-18 DIAGNOSIS — Z794 Long term (current) use of insulin: Secondary | ICD-10-CM

## 2017-03-18 DIAGNOSIS — I255 Ischemic cardiomyopathy: Secondary | ICD-10-CM | POA: Diagnosis not present

## 2017-03-18 DIAGNOSIS — IMO0002 Reserved for concepts with insufficient information to code with codable children: Secondary | ICD-10-CM

## 2017-03-18 DIAGNOSIS — E118 Type 2 diabetes mellitus with unspecified complications: Secondary | ICD-10-CM

## 2017-03-18 DIAGNOSIS — E1165 Type 2 diabetes mellitus with hyperglycemia: Secondary | ICD-10-CM | POA: Diagnosis not present

## 2017-03-18 MED ORDER — SEMAGLUTIDE(0.25 OR 0.5MG/DOS) 2 MG/1.5ML ~~LOC~~ SOPN: 0.2500 mg | PEN_INJECTOR | SUBCUTANEOUS | 0 refills | Status: DC

## 2017-03-18 MED ORDER — DULAGLUTIDE 0.75 MG/0.5ML ~~LOC~~ SOAJ: SUBCUTANEOUS | 11 refills | Status: DC

## 2017-03-18 NOTE — Assessment & Plan Note (Signed)
Diabetes longstanding currently uncontrolled with last A1C 9.6% (03/05/2017). Patient denies hypoglycemic events and is able to verbalize appropriate hypoglycemia management plan. Patient reports adherence with medication. Denies checking his BG at home. Control is suboptimal due to dietary indiscretion, sedentary lifestyle, and insulin resistance.  Following discussion and approval by Dr. Adriana Simasook, the following medication changes were made:  -Continue Jardiance and Tresiba at current doses; counseled on sick day rules with Jardiance.  -Provided with sample for Ozempic 0.25mg  weekly for 4 weeks, then 0.5mg  weekly until he runs out -Sent prescription for Trulicity 0.75mg  weekly to pharmacy with instructions to start Trulicity when he runs out of Ozempic sample -Counseled to check FBG 2-3x a week, and bring log to next visit -Patient would be an excellent candidate for CGM, however does not meet criteria for Medicare to cover at present. Consider Freestyle Libre Professional at future visits if he is unwilling to test FBG.  -Next A1C anticipated October 2018.

## 2017-03-18 NOTE — Progress Notes (Signed)
S:     Chief Complaint  Patient presents with  . Medication Management    diabetes   Patient arrives in good spirits and ambulating without assistance  Presents for diabetes evaluation, education, and management at the request of Dr. Adriana Simas. Patient was referred on 03/06/2017.  Patient was last seen by Primary Care Provider on 03/06/2017.  Patient reports he has been doing well but his legs are hurting him. Taking gabapentin 300 mg QHS without relief. Cannot feel feet, wearing shoes in house. States that he does not test his CBGs because seeing controlled or lower numbers "makes me go crazy" as he will eat more sweets in reaction.   Reports that he has had trouble sleeping x 2 nights, consuming 3 caffeinated drinks daily.   Has medicare A/B and medicaid. Copays are $0 at present.  Patient reports adherence with medications.  Current diabetes medications include: jardiance 25 mg daily, tresiba 40 units daily  Current hypertension medications include: lisinopril 10 mg daily, carvedilol 25 mg BID, furosemide 40 mg daily, imdur 30 mg TID Last took Nitrostat 4 months ago.  Patient denies hypoglycemic events, though reports dry mouth when hypoglycemic.  Patient reported dietary habits: Eats 1-2 meals/day.  Breakfast: 2 eggs Lunch: doesn't eat Dinner: chicken and rice, watermelon Snacks: cheese crackers, canteloupe Drinks: diet Coke ~3x a day  Patient reported exercise habits: limited by leg pain   Patient reports nocturia. 4-5x a night Patient denies pain/burning on urination Patient reports neuropathy. Feet are numb Patient denies visual changes. Patient denies self foot exams.   O:  Physical Exam  Constitutional: He appears well-developed and well-nourished.   Review of Systems  All other systems reviewed and are negative.  Lab Results  Component Value Date   HGBA1C 9.6 (H) 03/05/2017   Vitals:   03/18/17 1008  BP: 119/77  Pulse: 76   Home fasting CBG: Does not check  BG at home, though reports lowest as 120 mg/dL.  10 year ASCVD risk: 21.3% (assuming TC=130)  A/P: Diabetes longstanding currently uncontrolled with last A1C 9.6% (03/05/2017). Patient denies hypoglycemic events and is able to verbalize appropriate hypoglycemia management plan. Patient reports adherence with medication. Denies checking his BG at home. Control is suboptimal due to dietary indiscretion, sedentary lifestyle, and insulin resistance.  Following discussion and approval by Dr. Adriana Simas, the following medication changes were made:  -Continue Jardiance and Tresiba at current doses; counseled on sick day rules with Jardiance.  -Provided with sample for Ozempic 0.25mg  weekly for 4 weeks, then 0.5mg  weekly until he runs out -Sent prescription for Trulicity 0.75mg  weekly to pharmacy with instructions to start Trulicity when he runs out of Ozempic sample -Counseled to check FBG 2-3x a week, and bring log to next visit -Patient would be an excellent candidate for CGM, however does not meet criteria for Medicare to cover at present. Consider Freestyle Libre Professional at future visits if he is unwilling to test FBG.  -Next A1C anticipated October 2018.   ASCVD risk greater than 7.5%. Continued Aspirin 81 mg and Continued atorvastatin 80 mg.   Hypertension longstanding currently controlled with BP 119/77 today.  Patient reports adherence with medication.  -Continue with all HTN medications  Patient was seen with Dr. Adriana Simas today in clinic and medication changes were discussed and approved prior to initiation  Written patient instructions provided.  Total time in face to face counseling 45 minutes.   Follow up in Pharmacist Clinic Visit 4 weeks.   Patient seen  with Katherine MantleHeysel Lam, PharmD Candidate  Danny [name], Pharm.D. PGY2 Ambulatory Care Pharmacy Resident Phone: [phone]

## 2017-03-18 NOTE — Assessment & Plan Note (Signed)
ASCVD risk greater than 7.5%. Continued Aspirin 81 mg and Continued atorvastatin 80 mg.   Hypertension longstanding currently controlled with BP 119/77 today.  Patient reports adherence with medication.  -Continue with all HTN medications

## 2017-03-18 NOTE — Progress Notes (Signed)
Care was provided under my supervision. I agree with the management as indicated in the note.  Puanani Gene DO  

## 2017-03-18 NOTE — Patient Instructions (Signed)
Thank you for coming to see us today.   We NEED you to check your blood sugar in the mornings before you eat anything 2-3 times a week  START ozempic 0.25 mg once a week for the next 4 weeks, then increase to 0.5 mg once a week.   CONTINUE Tresiba 40 units once a day and jardiance 25 mg once a day  We will check on the continuous blood sugar monitor options for you.   Come back to see us in 1 month. Bring your  Blood sugar log with you.   If you have any trouble, please call me at [phone]

## 2017-03-27 ENCOUNTER — Ambulatory Visit (INDEPENDENT_AMBULATORY_CARE_PROVIDER_SITE_OTHER): Payer: Medicare Other | Admitting: Nurse Practitioner

## 2017-03-27 ENCOUNTER — Ambulatory Visit: Payer: Medicare Other | Admitting: Nurse Practitioner

## 2017-03-27 ENCOUNTER — Encounter: Payer: Self-pay | Admitting: Nurse Practitioner

## 2017-03-27 VITALS — BP 118/68 | HR 97 | Ht 71.0 in | Wt 238.2 lb

## 2017-03-27 DIAGNOSIS — N529 Male erectile dysfunction, unspecified: Secondary | ICD-10-CM

## 2017-03-27 DIAGNOSIS — N183 Chronic kidney disease, stage 3 unspecified: Secondary | ICD-10-CM

## 2017-03-27 DIAGNOSIS — I5042 Chronic combined systolic (congestive) and diastolic (congestive) heart failure: Secondary | ICD-10-CM | POA: Diagnosis not present

## 2017-03-27 DIAGNOSIS — I255 Ischemic cardiomyopathy: Secondary | ICD-10-CM | POA: Diagnosis not present

## 2017-03-27 DIAGNOSIS — I2510 Atherosclerotic heart disease of native coronary artery without angina pectoris: Secondary | ICD-10-CM | POA: Diagnosis not present

## 2017-03-27 DIAGNOSIS — E785 Hyperlipidemia, unspecified: Secondary | ICD-10-CM

## 2017-03-27 DIAGNOSIS — I10 Essential (primary) hypertension: Secondary | ICD-10-CM | POA: Diagnosis not present

## 2017-03-27 NOTE — Progress Notes (Signed)
Office Visit    Patient Name: Danny CORNETTE Sr. Date of Encounter: 03/27/2017  Primary Care Provider:  Tommie Sams, DO Primary Cardiologist:  Concha Se, MD   Chief Complaint    66 year old male with prior history of CAD status post LAD stenting, ischemic cardiomyopathy, chronic combined CHF, stage III chronic kidney disease, COPD and remote tobacco abuse, diabetes, hypertension, hyperlipidemia, erectile dysfunction, and obesity, who presents for follow-up.  Past Medical History    Past Medical History:  Diagnosis Date  . CAD (coronary artery disease)    a. 01/2010 PCI of LAD; b. 09/2014 PCI/DES of mLAD due to ISR. D1 80, D2 80(jailed), LCX 46m; c. 07/2016 NSTEMI/Cath: LM nl, LAD 36m ISR, 40d, D1 90ost, 80p, D2 90ost, RI min irregs, LCX min irregs, OM1 90 small, OM2/3 min irregs, RCA min irregs, RPLB1 90, EF 25-35%.  . Chronic combined systolic and diastolic CHF (congestive heart failure) (HCC)    a. 07/2016 Echo: EF 30%, severe septal/anterior HK, Gr1 DD.  [name] CKD (chronic kidney disease), stage III   . COPD (chronic obstructive pulmonary disease) (HCC)   . DM2 (diabetes mellitus, type 2) (HCC)   . Erectile dysfunction   . HLD (hyperlipidemia)   . HTN (hypertension)   . Ischemic cardiomyopathy    a. 07/2016 Echo: EF 30% w/ sev septal/ant HK. Gr1 DD.   Past Surgical History:  Procedure Laterality Date  . CAD: stent to the LAD    . CARDIAC CATHETERIZATION  10/01/2014  . CARDIAC CATHETERIZATION N/A 07/23/2016   Procedure: Left Heart Cath and Coronary Angiography;  Surgeon: Iran Ouch, MD;  Location: [facility] INVASIVE CV LAB;  Service: Cardiovascular;  Laterality: N/A;  . CORONARY ANGIOPLASTY WITH STENT PLACEMENT  10/01/2014    Allergies  No Known Allergies  History of Present Illness    66 year old male with the above complex past medical history including CAD status post LAD stenting in 2011 with subsequent repeat stenting in February 2016 in the setting of in-stent  restenosis. He suffered a non-ST segment elevation myocardial infarction in December 2017 with catheterization revealing moderate to severe small vessel disease with patent LAD stent. EF was found to be newly depressed at 25-35% by ventriculography in 30% by echo. He was medically managed. Other history includes hypertension, hyperlipidemia, ischemic cardiomyopathy, stage III chronic kidney disease, diabetes, and COPD. He was last seen in clinic in January and at that time was placed on Ranexa. This was subsequently titrated via a phone call but he has since come off of it. He is not sure when it was discontinued. He thinks maybe his primary care doctor stopped it. Regardless, he has been doing well without chest pain. He has chronic dyspnea on exertion which is unchanged over several years. He denies PND, orthopnea, dizziness, syncope, edema, or early satiety. He has been expressing erectile dysfunction and has asked if he could have Viagra but understands that because he is on long-acting nitrate therapy, he may not.  Home Medications    Prior to Admission medications   Medication Sig Start Date End Date Taking? Authorizing Provider  aspirin 81 MG tablet Take 81 mg by mouth daily.     Yes [provider]  atorvastatin (LIPITOR) 80 MG tablet Take 1 tablet (80 mg total) by mouth daily. 03/05/17  Yes Cook, Jayce G, DO  carvedilol (COREG) 25 MG tablet Take 1 tablet (25 mg total) by mouth 2 (two) times daily with a meal. 03/05/17  Yes Adriana Simas,  Jayce G, DO  clopidogrel (PLAVIX) 75 MG tablet Take 1 tablet (75 mg total) by mouth daily. 03/05/17  Yes Cook, Jayce G, DO  Dulaglutide (TRULICITY) 0.75 MG/0.5ML SOPN Inject 0.75 mg into the belly once weekly. START AFTER YOU FINISH SAMPLE OF OZEMPIC. 03/18/17  Yes Cook, Jayce G, DO  empagliflozin (JARDIANCE) 25 MG TABS tablet Take 25 mg by mouth daily.   Yes [provider]  furosemide (LASIX) 40 MG tablet Take 1 tablet (40 mg total) by mouth daily.  03/05/17  Yes Cook, Jayce G, DO  gabapentin (NEURONTIN) 300 MG capsule Take 1 capsule (300 mg total) by mouth at bedtime. 03/05/17  Yes Cook, Jayce G, DO  isosorbide mononitrate (IMDUR) 30 MG 24 hr tablet Take 30 mg by mouth 3 (three) times daily.  12/05/16  Yes [provider]  lisinopril (PRINIVIL,ZESTRIL) 10 MG tablet Take 1 tablet (10 mg total) by mouth daily. 03/05/17  Yes Cook, Jayce G, DO  nitroGLYCERIN (NITROSTAT) 0.4 MG SL tablet Place 0.4 mg under the tongue every 5 (five) minutes as needed for chest pain.    Yes [provider]  potassium chloride (MICRO-K) 10 MEQ CR capsule Take 10 mEq by mouth daily.   Yes [provider]  Semaglutide (OZEMPIC) 0.25 or 0.5 MG/DOSE SOPN Inject 0.25 mg into the skin once a week. 03/18/17  Yes Cook, Jayce G, DO  tamsulosin (FLOMAX) 0.4 MG CAPS capsule Take 0.4 mg by mouth daily.   Yes [provider]  TRESIBA FLEXTOUCH 200 UNIT/ML SOPN Inject 40 Units into the skin daily.  02/27/17  Yes [provider]    Review of Systems    He has chronic, stable dyspnea on exertion. He denies chest pain, palpitations, PND, orthopnea, dizziness, syncope, edema, or early satiety.  All other systems reviewed and are otherwise negative except as noted above.  Physical Exam    VS:  BP 118/68 (BP Location: Left Arm, Patient Position: Sitting, Cuff Size: Normal)   Pulse 97   Ht 5\' 11"  (1.803 m)   Wt 238 lb 4 oz (108.1 kg)   BMI 33.23 kg/m  , BMI Body mass index is 33.23 kg/m. GEN: Obese, in no acute distress.  HEENT: normal.  Neck: Supple, obese, difficult to gauge JVP, no carotid bruits, or masses. Cardiac: RRR, distant, no murmurs, rubs, or gallops. No clubbing, cyanosis, edema.  Radials/DP/PT 1+ and equal bilaterally.  Respiratory:  Respirations regular and unlabored, diminished breath sounds bilaterally GI: Soft, nontender, nondistended, BS + x 4. MS: no deformity or atrophy. Skin: warm and dry, no rash. Neuro:  Strength  and sensation are intact. Psych: Normal affect.  Accessory Clinical Findings    ECG - Regular sinus rhythm, 95, nonspecific T changes no acute changes.  Lab Results  Component Value Date   CREATININE 1.44 03/05/2017   BUN 32 (H) 03/05/2017   NA 140 03/05/2017   K 4.1 03/05/2017   CL 107 03/05/2017   CO2 26 03/05/2017    Lab Results  Component Value Date   CHOL 92 03/05/2017   HDL 42.10 03/05/2017   LDLCALC 35 03/05/2017   TRIG 78.0 03/05/2017   CHOLHDL 2 03/05/2017    Lab Results  Component Value Date   HGBA1C 9.6 (H) 03/05/2017   Assessment & Plan    1.  Coronary artery disease: Status post prior LAD stenting with non-STEMI in December 2017. Catheterization at that time showed moderate to severe diffuse small vessel and branch disease with patent LAD  stent. He has been medically managed since. Initially, he was expressing chest pain and he was placed on Ranexa in January but this has since fallen off of his medication list. He has been doing well without chest pain and remains on aspirin, statin, beta blocker, Plavix, ACE inhibitor, and long-acting nitrate therapy.  2. Ischemic cardiomyopathy/chronic combined systolic and diastolic congestive heart failure: He has chronic dyspnea on exertion which is likely multifactorial in the setting of LV dysfunction, ischemic heart disease, and COPD. Dyspnea is unchanged. Since his last visit, his weight has come down from 256-238. He has been weighing himself daily and is careful with salt. He is euvolemic on exam. He remains on beta blocker, ACE inhibitor. He was supposed to have a follow-up echo in 3 months after non-STEMI this was never performed. I will arrange for follow-up echo to reevaluate LV function and determine candidacy for ICD. If LV function remains depressed, I would like to add spironolactone therapy and if pressure were stable, also look to transition to [facility].  3. Essential hypertension: Stable.  4. Hyperlipidemia: He  remains on statin therapy. LDL was 35 in July.  5. Stage III chronic kidney disease: Creatinine was stable at 1.44 in July.  6. Type 2 diabetes mellitus: Patient is followed closely by primary care. His hemoglobin A1c was 9.6 in July. He admits to eating simple carbs and starches on a regular basis. He often will eat a honey bun. We discussed improved dietary compliance and he will continue to follow-up with primary care.  7. Morbid obesity: Patient is sedentary. He doesn't do any exercise at all. We discussed the importance of increasing activity and limiting calories.  8. Erectile dysfunction: Patient complained of erectile dysfunction and requested a prescription for Viagra. Patient is currently taking isosorbide mononitrate and does not wish to come off of it. As a result, I have not provided him with a prescription for an erectile aid.  9. Disposition: Follow-up echo.. Follow-up in 3 months or sooner if necessary.  Nicolasa Ducking, NP 03/27/2017, 8:58 AM

## 2017-03-27 NOTE — Patient Instructions (Addendum)
Testing/Procedures: Your physician has requested that you have an echocardiogram. Echocardiography is a painless test that uses sound waves to create images of your heart. It provides your doctor with information about the size and shape of your heart and how well your heart's chambers and valves are working. This procedure takes approximately one hour. There are no restrictions for this procedure.    Follow-Up: Your physician recommends that you schedule a follow-up appointment in: 3 months with Dr. Mariah Milling.  It was a pleasure seeing you today here in the office. Please do not hesitate to give [place] a call back if you have any further questions. [phone]  Blue Diamond Cellar RN, BSN    Echocardiogram An echocardiogram, or echocardiography, uses sound waves (ultrasound) to produce an image of your heart. The echocardiogram is simple, painless, obtained within a short period of time, and offers valuable information to your health care provider. The images from an echocardiogram can provide information such as:  Evidence of coronary artery disease (CAD).  Heart size.  Heart muscle function.  Heart valve function.  Aneurysm detection.  Evidence of a past heart attack.  Fluid buildup around the heart.  Heart muscle thickening.  Assess heart valve function.  Tell a health care provider about:  Any allergies you have.  All medicines you are taking, including vitamins, herbs, eye drops, creams, and over-the-counter medicines.  Any problems you or family members have had with anesthetic medicines.  Any blood disorders you have.  Any surgeries you have had.  Any medical conditions you have.  Whether you are pregnant or may be pregnant. What happens before the procedure? No special preparation is needed. Eat and drink normally. What happens during the procedure?  In order to produce an image of your heart, gel will be applied to your chest and a wand-like tool (transducer) will be  moved over your chest. The gel will help transmit the sound waves from the transducer. The sound waves will harmlessly bounce off your heart to allow the heart images to be captured in real-time motion. These images will then be recorded.  You may need an IV to receive a medicine that improves the quality of the pictures. What happens after the procedure? You may return to your normal schedule including diet, activities, and medicines, unless your health care provider tells you otherwise. This information is not intended to replace advice given to you by your health care provider. Make sure you discuss any questions you have with your health care provider. Document Released: 07/20/2000 Document Revised: 03/10/2016 Document Reviewed: 03/30/2013 Elsevier Interactive Patient Education  2017 [employer].

## 2017-03-30 ENCOUNTER — Other Ambulatory Visit: Payer: Self-pay | Admitting: Family Medicine

## 2017-04-05 ENCOUNTER — Other Ambulatory Visit: Payer: Self-pay

## 2017-04-05 ENCOUNTER — Ambulatory Visit (INDEPENDENT_AMBULATORY_CARE_PROVIDER_SITE_OTHER): Payer: Medicare Other

## 2017-04-05 ENCOUNTER — Telehealth: Payer: Self-pay

## 2017-04-05 DIAGNOSIS — I5042 Chronic combined systolic (congestive) and diastolic (congestive) heart failure: Secondary | ICD-10-CM | POA: Diagnosis not present

## 2017-04-05 DIAGNOSIS — R0781 Pleurodynia: Secondary | ICD-10-CM | POA: Diagnosis not present

## 2017-04-05 NOTE — Telephone Encounter (Signed)
Patient came in this morning with left side pain. Patient was at heart care this morning and they told him to come to his PCP. He says this pain started yesterday when he reached through the window of his truck to get something he felt a pinch and has pain since then.   BP- 134/72 T- 98.3 P- 101 R- 18 O2- 91%

## 2017-04-09 ENCOUNTER — Telehealth: Payer: Self-pay | Admitting: Family Medicine

## 2017-04-09 ENCOUNTER — Ambulatory Visit: Payer: Medicare Other | Admitting: Family Medicine

## 2017-04-09 NOTE — Telephone Encounter (Signed)
FYI - Pt called and cancelled appt. Pt had a death in the family.

## 2017-04-10 ENCOUNTER — Encounter: Payer: Self-pay | Admitting: *Deleted

## 2017-04-10 ENCOUNTER — Telehealth: Payer: Self-pay | Admitting: *Deleted

## 2017-04-10 NOTE — Telephone Encounter (Signed)
Letter placed in outgoing mail for patient to call back with updated telephone information and to review echocardiogram results.

## 2017-04-10 NOTE — Telephone Encounter (Signed)
-----   Message from Ok Anishristopher R Berge, NP sent at 04/07/2017  8:03 AM EDT ----- Heart pumping function has normalized.  Great news.  Cont current medications.

## 2017-04-12 ENCOUNTER — Other Ambulatory Visit: Payer: Self-pay | Admitting: Family Medicine

## 2017-04-15 NOTE — Telephone Encounter (Signed)
Pt received letter regarding echo results, and was returning the call.

## 2017-04-15 NOTE — Telephone Encounter (Signed)
Results of echo called to pt. Pt verbalized understanding.

## 2017-04-22 ENCOUNTER — Encounter: Payer: Self-pay | Admitting: Pharmacist

## 2017-04-22 ENCOUNTER — Ambulatory Visit (INDEPENDENT_AMBULATORY_CARE_PROVIDER_SITE_OTHER): Payer: Medicare Other | Admitting: Pharmacist

## 2017-04-22 DIAGNOSIS — E1165 Type 2 diabetes mellitus with hyperglycemia: Secondary | ICD-10-CM

## 2017-04-22 DIAGNOSIS — I255 Ischemic cardiomyopathy: Secondary | ICD-10-CM

## 2017-04-22 DIAGNOSIS — I10 Essential (primary) hypertension: Secondary | ICD-10-CM | POA: Diagnosis not present

## 2017-04-22 DIAGNOSIS — E118 Type 2 diabetes mellitus with unspecified complications: Secondary | ICD-10-CM | POA: Diagnosis not present

## 2017-04-22 DIAGNOSIS — IMO0002 Reserved for concepts with insufficient information to code with codable children: Secondary | ICD-10-CM

## 2017-04-22 DIAGNOSIS — Z794 Long term (current) use of insulin: Secondary | ICD-10-CM

## 2017-04-22 MED ORDER — EXENATIDE ER 2 MG ~~LOC~~ PEN: 2.0000 mg | PEN_INJECTOR | SUBCUTANEOUS | 3 refills | Status: DC

## 2017-04-22 NOTE — Patient Instructions (Addendum)
Thanks for coming to see [place] today! We cannot make any changes without knowing how your sugars are doing.   1. Check your blood sugar once a day before breakfast  2. Continue taking all of your medications like you have been.   Come back to see me in 2-3 weeks. Bring your blood sugar log with you.

## 2017-04-22 NOTE — Assessment & Plan Note (Addendum)
Diabetes longstanding currently uncontrolled per last A1C and sx report. Patient denies hypoglycemic events and is able to verbalize appropriate hypoglycemia management plan. Patient reports adherence with medication. Control is suboptimal due to sedentary lifestyle, dietary indiscretion, non adherence to CBG checks. Following discussion and approval by Dr. Adriana Simas, the following medication changes were made:  -Had long, detailed discussion about micro- and macrovascular complications of uncontrolled DM.  -Patient is to check CBGs once daily before breakfast. Patient agreeable.  -Continued current meds, unable to make changes given lack of CBG data. Will change Trulicity to bydureon as patient has Medicaid and bydureon is preferred. Will call patient to inform him of change.  -Counseled on sick day rules for Jardiance. Patient verbalized understanding.   -Next A1C anticipated October 2018.    ASCVD risk greater than 7.5%. Continued Aspirin 81 mg and Continued atorvastatin 80 mg.

## 2017-04-22 NOTE — Progress Notes (Signed)
Care was provided under my supervision. I agree with the management as indicated in the note.  Yaslin Kirtley DO  

## 2017-04-22 NOTE — Progress Notes (Signed)
S:     Chief Complaint  Patient presents with  . Medication Management    Diabetes     Patient arrives in fair spirits, ambulating without assistance.  Presents for diabetes evaluation, education, and management at the request of Dr. Adriana Simas. Patient was referred on 03/06/2017.  Patient was last seen by Primary Care Provider on 03/06/2017. Last pharmacy clinic visit was on 03/18/2017 where ozempic was started.   Since last visit, patient reports that he has "not been doing anything". States that he has not slept or taken his meds yet as he was "out partying". States that he has had increased stressed 2/2 death in the family. Has not been sleeping well. Complains of numbness in feet and pain in ribs. Endorses medication adherence however states he just found his meter last week. Has not been checking CBGs. Checked several weeks ago and it was in the 500s.   Patient has medicare and medicaid, meds are affordable at this time. States that the Evaristo Bury was not sent to his pharmacy.   Family/Social History: recent death in family, lives with mother, daughter set up meter with new needle for him  Patient reports adherence with medications.  Current diabetes medications include: Tresiba 40 units, ozempic 0.25 weekly (to change to [place] once he runs out of samples), glipizide 10 mg BID, Jardiance 25 mg daily Current hypertension medications include:    Patient denies hypoglycemic events.  Patient reported dietary habits: Eats 1-2 meals/day Breakfast: sausage and eggs Lunch: doesn't eat Dinner: macaroni, steak, potatoes Snacks: nabs Drinks: coffee, water, coke zero  Patient reported exercise habits: none at present, limited by pain in feet   Patient reports nocturia. Varied - anywhere from 2-5 times Patient reports neuropathy. Patient denies visual changes. Patient denies self foot exams.   .[email or web address]   O:  Physical Exam  Constitutional: He appears well-developed and well-nourished.    Vitals reviewed.    Review of Systems  All other systems reviewed and are negative.   Lab Results  Component Value Date   HGBA1C 9.6 (H) 03/05/2017   Vitals:   04/22/17 0826 04/22/17 0835  BP: (!) 164/101 (!) 153/114  Pulse: 96     Home fasting CBG: Not checking 2 hour post-prandial/random CBG: not checking  A/P: Diabetes longstanding currently uncontrolled per last A1C and sx report. Patient denies hypoglycemic events and is able to verbalize appropriate hypoglycemia management plan. Patient reports adherence with medication. Control is suboptimal due to sedentary lifestyle, dietary indiscretion, non adherence to CBG checks. Following discussion and approval by Dr. Adriana Simas, the following medication changes were made:  -Had long, detailed discussion about micro- and macrovascular complications of uncontrolled DM.  -Patient is to check CBGs once daily before breakfast. Patient agreeable.  -Continued current meds, unable to make changes given lack of CBG data. Will change Trulicity to bydureon as patient has Medicaid and bydureon is preferred. Will call patient to inform him of change.  -Counseled on sick day rules for Jardiance. Patient verbalized understanding.   -Next A1C anticipated October 2018.    ASCVD risk greater than 7.5%. Continued Aspirin 81 mg and Continued atorvastatin 80 mg.   Hypertension longstanding currently uncontrolled however h/o controlled CBGs.  Patient reports adherence with medication. Control is suboptimal due to not taking meds yet today, dietary indiscretion, recent EtOH use. -Continue current meds -Reassess at next visit  Patient was seen with Dr. Adriana Simas today in clinic and medication changes were discussed and approved prior to initiation  Written patient instructions provided.  Total time in face to face counseling 30 minutes.   Follow up in Pharmacist Clinic Visit 2-3 weeks.   Patient seen with Dr. Tessie Eke, Pharm.D. PGY2  [facility] Resident Phone: [phone]

## 2017-04-22 NOTE — Assessment & Plan Note (Signed)
Hypertension longstanding currently uncontrolled however h/o controlled CBGs.  Patient reports adherence with medication. Control is suboptimal due to not taking meds yet today, dietary indiscretion, recent EtOH use. -Continue current meds -Reassess at next visit

## 2017-04-29 ENCOUNTER — Telehealth: Payer: Self-pay | Admitting: *Deleted

## 2017-04-29 NOTE — Telephone Encounter (Signed)
Spoke with [facility] and they will counsel patient on how to use.

## 2017-04-29 NOTE — Telephone Encounter (Signed)
Pt stated that the pharmacy did not receive the script for [facility]

## 2017-04-30 ENCOUNTER — Emergency Department
Admission: EM | Admit: 2017-04-30 | Discharge: 2017-04-30 | Disposition: A | Discharge status: Home / Self Care | Payer: Medicare Other | Attending: Emergency Medicine | Admitting: Emergency Medicine

## 2017-04-30 ENCOUNTER — Emergency Department: Payer: Medicare Other

## 2017-04-30 DIAGNOSIS — I2510 Atherosclerotic heart disease of native coronary artery without angina pectoris: Secondary | ICD-10-CM | POA: Diagnosis not present

## 2017-04-30 DIAGNOSIS — I130 Hypertensive heart and chronic kidney disease with heart failure and stage 1 through stage 4 chronic kidney disease, or unspecified chronic kidney disease: Secondary | ICD-10-CM | POA: Diagnosis not present

## 2017-04-30 DIAGNOSIS — Z7902 Long term (current) use of antithrombotics/antiplatelets: Secondary | ICD-10-CM | POA: Diagnosis not present

## 2017-04-30 DIAGNOSIS — J449 Chronic obstructive pulmonary disease, unspecified: Secondary | ICD-10-CM | POA: Insufficient documentation

## 2017-04-30 DIAGNOSIS — N183 Chronic kidney disease, stage 3 (moderate): Secondary | ICD-10-CM | POA: Diagnosis not present

## 2017-04-30 DIAGNOSIS — I5042 Chronic combined systolic (congestive) and diastolic (congestive) heart failure: Secondary | ICD-10-CM | POA: Insufficient documentation

## 2017-04-30 DIAGNOSIS — E1122 Type 2 diabetes mellitus with diabetic chronic kidney disease: Secondary | ICD-10-CM | POA: Diagnosis not present

## 2017-04-30 DIAGNOSIS — Z7982 Long term (current) use of aspirin: Secondary | ICD-10-CM | POA: Insufficient documentation

## 2017-04-30 DIAGNOSIS — Z794 Long term (current) use of insulin: Secondary | ICD-10-CM | POA: Insufficient documentation

## 2017-04-30 DIAGNOSIS — E860 Dehydration: Secondary | ICD-10-CM | POA: Insufficient documentation

## 2017-04-30 DIAGNOSIS — Z87891 Personal history of nicotine dependence: Secondary | ICD-10-CM | POA: Diagnosis not present

## 2017-04-30 DIAGNOSIS — Z79899 Other long term (current) drug therapy: Secondary | ICD-10-CM | POA: Diagnosis not present

## 2017-04-30 DIAGNOSIS — R55 Syncope and collapse: Secondary | ICD-10-CM

## 2017-04-30 DIAGNOSIS — S299XXA Unspecified injury of thorax, initial encounter: Secondary | ICD-10-CM | POA: Diagnosis not present

## 2017-04-30 LAB — CBC WITH DIFFERENTIAL/PLATELET
Basophils Absolute: 0.1 10*3/uL (ref 0–0.1)
Basophils Relative: 1 %
Eosinophils Absolute: 0.3 10*3/uL (ref 0–0.7)
Eosinophils Relative: 3 %
HCT: 48.7 % (ref 40.0–52.0)
Hemoglobin: 16.2 g/dL (ref 13.0–18.0)
Lymphocytes Relative: 24 %
Lymphs Abs: 2.5 10*3/uL (ref 1.0–3.6)
MCH: 30.9 pg (ref 26.0–34.0)
MCHC: 33.3 g/dL (ref 32.0–36.0)
MCV: 92.6 fL (ref 80.0–100.0)
Monocytes Absolute: 1.2 10*3/uL — ABNORMAL HIGH (ref 0.2–1.0)
Monocytes Relative: 11 %
Neutro Abs: 6.5 10*3/uL (ref 1.4–6.5)
Neutrophils Relative %: 61 %
Platelets: 250 10*3/uL (ref 150–440)
RBC: 5.26 MIL/uL (ref 4.40–5.90)
RDW: 13.7 % (ref 11.5–14.5)
WBC: 10.5 10*3/uL (ref 3.8–10.6)

## 2017-04-30 LAB — COMPREHENSIVE METABOLIC PANEL
ALT: 12 U/L — ABNORMAL LOW (ref 17–63)
AST: 15 U/L (ref 15–41)
Albumin: 3.2 g/dL — ABNORMAL LOW (ref 3.5–5.0)
Alkaline Phosphatase: 85 U/L (ref 38–126)
Anion gap: 8 (ref 5–15)
BUN: 49 mg/dL — ABNORMAL HIGH (ref 6–20)
CO2: 19 mmol/L — ABNORMAL LOW (ref 22–32)
Calcium: 7.5 mg/dL — ABNORMAL LOW (ref 8.9–10.3)
Chloride: 109 mmol/L (ref 101–111)
Creatinine, Ser: 1.6 mg/dL — ABNORMAL HIGH (ref 0.61–1.24)
GFR calc Af Amer: 50 mL/min — ABNORMAL LOW (ref >60)
GFR calc non Af Amer: 43 mL/min — ABNORMAL LOW (ref >60)
Glucose, Bld: 181 mg/dL — ABNORMAL HIGH (ref 65–99)
Potassium: 4 mmol/L (ref 3.5–5.1)
Sodium: 136 mmol/L (ref 135–145)
Total Bilirubin: 1.3 mg/dL — ABNORMAL HIGH (ref 0.3–1.2)
Total Protein: 6 g/dL — ABNORMAL LOW (ref 6.5–8.1)

## 2017-04-30 LAB — URINALYSIS, COMPLETE (UACMP) WITH MICROSCOPIC
Bacteria, UA: NONE SEEN
Bilirubin Urine: NEGATIVE
Glucose, UA: >=500 mg/dL — AB
Hgb urine dipstick: NEGATIVE
Ketones, ur: NEGATIVE mg/dL
Leukocytes, UA: NEGATIVE
Nitrite: NEGATIVE
Protein, ur: NEGATIVE mg/dL
Specific Gravity, Urine: 1.018 (ref 1.005–1.030)
pH: 5 (ref 5.0–8.0)

## 2017-04-30 LAB — LACTIC ACID, PLASMA: Lactic Acid, Venous: 1 mmol/L (ref 0.5–1.9)

## 2017-04-30 LAB — TROPONIN I: Troponin I: <0.03 ng/mL (ref <0.03)

## 2017-04-30 MED ORDER — SODIUM CHLORIDE 0.9 % IV BOLUS (SEPSIS)
1000.0000 mL | Freq: Once | INTRAVENOUS | Status: AC
  Administered 2017-04-30: 1000 mL via INTRAVENOUS

## 2017-04-30 MED ORDER — SODIUM CHLORIDE 0.9 % IV BOLUS (SEPSIS): 1000.0000 mL | Freq: Once | INTRAVENOUS | Status: DC

## 2017-04-30 NOTE — Discharge Instructions (Signed)
Return to the ER immediately for new or worsening lightheadedness, weakness, difficulty breathing, chest pain, fever or chills, urinary symptoms, or any other new or worsening symptoms that concern you. Make sure to drink plenty of water, eat regularly, and take your regular medications as prescribed. Follow up with your regular doctor on Monday as planned.

## 2017-04-30 NOTE — ED Provider Notes (Signed)
[facility] Emergency Department Provider Note ____________________________________________   First MD Initiated Contact with Patient 04/30/17 1954     (approximate)  I have reviewed the triage vital signs and the nursing notes.   HISTORY  Chief Complaint Dizziness and Fall    HPI Danny [name] Sr. is a 66 y.o. male with a history of CAD, CK-MB, COPD, diabetes, and hypertension, who presents with dizziness acute onset since this morning, described as lightheadedness, associated with an episode of syncope today. Patient states that he fell and thinks he hit the door on the way down and ended up on the ground. Patient states that he was feeling at his baseline until this morning. He states that he was started on a new type of insulin (does not know the name) in the last few days and took yesterday as prescribed. Patient denies any fever or chills, cough or shortness of breath, urinary symptoms, vomiting or diarrhea. Denies headache or neck pain.    Past Medical History:  Diagnosis Date  . CAD (coronary artery disease)    a. 01/2010 PCI of LAD; b. 09/2014 PCI/DES of mLAD due to ISR. D1 80, D2 80(jailed), LCX 41m; c. 07/2016 NSTEMI/Cath: LM nl, LAD 89m ISR, 40d, D1 90ost, 80p, D2 90ost, RI min irregs, LCX min irregs, OM1 90 small, OM2/3 min irregs, RCA min irregs, RPLB1 90, EF 25-35%.  . Chronic combined systolic and diastolic CHF (congestive heart failure) (HCC)    a. 07/2016 Echo: EF 30%, severe septal/anterior HK, Gr1 DD.  [name] CKD (chronic kidney disease), stage III   . COPD (chronic obstructive pulmonary disease) (HCC)   . DM2 (diabetes mellitus, type 2) (HCC)   . Erectile dysfunction   . HLD (hyperlipidemia)   . HTN (hypertension)   . Ischemic cardiomyopathy    a. 07/2016 Echo: EF 30% w/ sev septal/ant HK. Gr1 DD.    Patient Active Problem List   Diagnosis Date Noted  . CKD (chronic kidney disease) stage 3, GFR 30-59 ml/min 03/06/2017  . Diabetic  neuropathy (HCC) 03/06/2017  . Chronic combined systolic and diastolic CHF (congestive heart failure) (HCC) 03/05/2017  . Morbid obesity (HCC) 06/18/2016  . H/O medication noncompliance 06/18/2016  . COPD (chronic obstructive pulmonary disease) (HCC) 06/20/2015  . GERD (gastroesophageal reflux disease) 12/20/2014  . Diabetes mellitus type 2, uncontrolled, with complications (HCC) 06/05/2010  . Hyperlipidemia 02/10/2010  . Essential hypertension 02/10/2010  . CAD (coronary artery disease) 02/10/2010    Past Surgical History:  Procedure Laterality Date  . CAD: stent to the LAD    . CARDIAC CATHETERIZATION  10/01/2014  . CARDIAC CATHETERIZATION N/A 07/23/2016   Procedure: Left Heart Cath and Coronary Angiography;  Surgeon: Iran Ouch, MD;  Location: [facility] INVASIVE CV LAB;  Service: Cardiovascular;  Laterality: N/A;  . CORONARY ANGIOPLASTY WITH STENT PLACEMENT  10/01/2014    Prior to Admission medications   Medication Sig Start Date End Date Taking? Authorizing Provider  aspirin 81 MG tablet Take 81 mg by mouth daily.      [provider]  atorvastatin (LIPITOR) 80 MG tablet Take 1 tablet (80 mg total) by mouth daily. 03/05/17   Tommie Sams, DO  carvedilol (COREG) 25 MG tablet Take 1 tablet (25 mg total) by mouth 2 (two) times daily with a meal. 03/05/17   Tommie Sams, DO  clopidogrel (PLAVIX) 75 MG tablet Take 1 tablet (75 mg total) by mouth daily. 03/05/17   Tommie Sams, DO  empagliflozin (JARDIANCE) 25 MG TABS tablet Take 25 mg by mouth daily.    [provider]  Exenatide ER (BYDUREON) 2 MG PEN Inject 2 mg into the skin once a week. 04/22/17   Tommie Sams, DO  furosemide (LASIX) 40 MG tablet Take 1 tablet (40 mg total) by mouth daily. 03/05/17   Tommie Sams, DO  gabapentin (NEURONTIN) 300 MG capsule Take 1 capsule (300 mg total) by mouth at bedtime. 03/05/17   Tommie Sams, DO  glipiZIDE (GLUCOTROL) 10 MG tablet TAKE ONE TABLET BY MOUTH TWICE DAILY 04/01/17    Everlene Other G, DO  isosorbide mononitrate (IMDUR) 30 MG 24 hr tablet Take 30 mg by mouth 3 (three) times daily.  12/05/16   [provider]  lisinopril (PRINIVIL,ZESTRIL) 10 MG tablet Take 1 tablet (10 mg total) by mouth daily. 03/05/17   Tommie Sams, DO  nitroGLYCERIN (NITROSTAT) 0.4 MG SL tablet Place 0.4 mg under the tongue every 5 (five) minutes as needed for chest pain.     [provider]  potassium chloride (MICRO-K) 10 MEQ CR capsule Take 10 mEq by mouth daily.    [provider]  Semaglutide (OZEMPIC) 0.25 or 0.5 MG/DOSE SOPN Inject 0.25 mg into the skin once a week. 03/18/17   Tommie Sams, DO  tamsulosin (FLOMAX) 0.4 MG CAPS capsule TAKE ONE CAPSULE BY MOUTH EVERY DAY 04/12/17   Cook, Jayce G, DO  TRESIBA FLEXTOUCH 200 UNIT/ML SOPN Inject 40 Units into the skin daily.  02/27/17   [provider]    Allergies Patient has no known allergies.  Family History  Problem Relation Age of Onset  . Heart attack Father        complications  . Cancer Brother     Social History Social History  Substance Use Topics  . Smoking status: Former Smoker    Packs/day: 3.00    Years: 0.00    Types: Cigarettes    Quit date: 09/07/2009  . Smokeless tobacco: Never Used     Comment: quit june 2011  . Alcohol use Yes     Comment: every other weekend    Review of Systems  Constitutional: No fever. Eyes: No visual changes. ENT: No sore throat. Cardiovascular: Denies chest pain. Respiratory: Denies shortness of breath. Gastrointestinal: No nausea, no vomiting.   Genitourinary: Negative for dysuria.  Musculoskeletal: Negative for back pain. Skin: Negative for rash. Neurological: Negative for headaches.   ____________________________________________   PHYSICAL EXAM:  VITAL SIGNS: ED Triage Vitals  Enc Vitals Group     BP 04/30/17 1946 (!) 93/59     Pulse Rate 04/30/17 1942 (!) 108     Resp 04/30/17 1942 20     Temp 04/30/17 1942 97.9 F (36.6 C)      Temp Source 04/30/17 1942 Oral     SpO2 04/30/17 1942 94 %     Weight 04/30/17 1946 241 lb (109.3 kg)     Height 04/30/17 1946  (1.803 m)     Head Circumference --      Peak Flow --      Pain Score 04/30/17 1945 5     Pain Loc --      Pain Edu? --      Excl. in GC? --     Constitutional: Alert and oriented. Relatively well appearing and in no acute distress. Eyes: Conjunctivae are normal.  EOMI.  PERRLA.  Head: Atraumatic. Nose: No congestion/rhinnorhea. Mouth/Throat: Mucous membranes are  slightly dry.   Neck: Normal range of motion.  No midline cspine tenderness.  Cardiovascular: Normal rate, regular rhythm. Grossly normal heart sounds.  Good peripheral circulation. Respiratory: Normal respiratory effort.  No retractions. Lungs CTAB. Gastrointestinal: Soft and nontender. No distention.  Genitourinary: No CVA tenderness. Musculoskeletal: No lower extremity edema.  Extremities warm and well perfused. No midline spinal tenderness.  Neurologic:  Normal speech and language. No gross focal neurologic deficits are appreciated.  Skin:  Skin is warm and dry. No rash noted. Psychiatric: Mood and affect are normal. Speech and behavior are normal.  ____________________________________________   LABS (all labs ordered are listed, but only abnormal results are displayed)  Labs Reviewed  COMPREHENSIVE METABOLIC PANEL - Abnormal; Notable for the following:       Result Value   CO2 19 (*)    Glucose, Bld 181 (*)    BUN 49 (*)    Creatinine, Ser 1.60 (*)    Calcium 7.5 (*)    Total Protein 6.0 (*)    Albumin 3.2 (*)    ALT 12 (*)    Total Bilirubin 1.3 (*)    GFR calc non Af Amer 43 (*)    GFR calc Af Amer 50 (*)    All other components within normal limits  CBC WITH DIFFERENTIAL/PLATELET - Abnormal; Notable for the following:    Monocytes Absolute 1.2 (*)    All other components within normal limits  URINALYSIS, COMPLETE (UACMP) WITH MICROSCOPIC - Abnormal; Notable for the  following:    Color, Urine YELLOW (*)    APPearance CLEAR (*)    Glucose, UA >=500 (*)    Squamous Epithelial / LPF 0-5 (*)    All other components within normal limits  LACTIC ACID, PLASMA  TROPONIN I  LACTIC ACID, PLASMA   ____________________________________________  EKG  ED ECG REPORT I, Dionne Bucy, the attending physician, personally viewed and interpreted this ECG.  Date: 04/30/2017 EKG Time: 1943 Rate: 110 Rhythm: sinus tachycardia QRS Axis: normal Intervals: normal ST/T Wave abnormalities: T-wave inversions V5 and V6 Narrative Interpretation: no evidence of acute ischemia; no significant change when compared to EKG of 03/27/2017  ____________________________________________  RADIOLOGY  Chest x-ray shows no infiltrate or other active cardiopulmonary disease.  ____________________________________________   PROCEDURES  Procedure(s) performed: No    Critical Care performed: No ____________________________________________   INITIAL IMPRESSION / ASSESSMENT AND PLAN / ED COURSE  Pertinent labs & imaging results that were available during my care of the patient were reviewed by me and considered in my medical decision making (see chart for details).  66 year old male with past medical history as noted presents with 1 day of lightheadedness had an episode of syncope earlier today. In the ED initially patient was hypotensive and slightly tachycardic, with other vital signs normal. Exam as described with no significant focal findings. No evidence of significant head trauma or other injury.  differential includes infection/sepsis, dehydration or other metabolic cause, less likely hypoglycemia or medication related given the abnormal vital signs, less likely cardiac.  Plan for basic and cardiac labs, infection workup, fluids, and reassess.   Clinical Course as of May 01 2151  Tue Apr 30, 2017  2145 Cr and BUN with no significant worsening from pt's baseline.    [SS]    Clinical Course User Index [SS] Dionne Bucy, MD   ----------------------------------------- 9:50 PM on 04/30/2017 -----------------------------------------  Patient's vital signs have normalized. Currently he is sitting up at the edge of the bed, states he  feels completely back to normal, and states that he wants to go home as soon as possible.  Patient's infection workup is negative; labs are unremarkable except for some abnormalities on the CMP however there is no significant change from his baseline in these values.  At this time patient would strongly like to go home, and there is no indication for prolonged ED obs or admission.  No evidence of sepsis or significant complication from his medications.  Susp most likely pt may have been somewhat dehydrated; reports drinking less water than usual over the last day.    I gave patient extensive return precautions, and he has appt scheduled with his PMD early next week.    ____________________________________________   FINAL CLINICAL IMPRESSION(S) / ED DIAGNOSES  Final diagnoses:  Syncope, unspecified syncope type  Dehydration      NEW MEDICATIONS STARTED DURING THIS VISIT:  New Prescriptions   No medications on file     Note:  This document was prepared using Dragon voice recognition software and may include unintentional dictation errors.    Dionne Bucy, MD 04/30/17 2152

## 2017-04-30 NOTE — ED Triage Notes (Signed)
Patient presents for a fall after becoming dizzy. States he has right shoulder pain and his head feels numb. No slurred speech noted. No facial asymmetry. Patient is able to answer questions appropriately. States he checked his sugar and it was 287 this morning.

## 2017-04-30 NOTE — ED Notes (Signed)
Helped pt to bathroom. 

## 2017-04-30 NOTE — ED Notes (Signed)
Pt states he got up out of bed around 4:30ish and fell. He fell on his right side and complains of pain behind neck and shoulders on right side. Says is feels "numb".

## 2017-05-06 ENCOUNTER — Ambulatory Visit (INDEPENDENT_AMBULATORY_CARE_PROVIDER_SITE_OTHER): Payer: Medicare Other | Admitting: Pharmacist

## 2017-05-06 ENCOUNTER — Encounter: Payer: Self-pay | Admitting: Pharmacist

## 2017-05-06 VITALS — BP 125/86 | HR 93 | Wt 233.0 lb

## 2017-05-06 DIAGNOSIS — E1165 Type 2 diabetes mellitus with hyperglycemia: Secondary | ICD-10-CM

## 2017-05-06 DIAGNOSIS — E118 Type 2 diabetes mellitus with unspecified complications: Secondary | ICD-10-CM

## 2017-05-06 DIAGNOSIS — I255 Ischemic cardiomyopathy: Secondary | ICD-10-CM

## 2017-05-06 DIAGNOSIS — IMO0002 Reserved for concepts with insufficient information to code with codable children: Secondary | ICD-10-CM

## 2017-05-06 DIAGNOSIS — I10 Essential (primary) hypertension: Secondary | ICD-10-CM | POA: Diagnosis not present

## 2017-05-06 LAB — COMPREHENSIVE METABOLIC PANEL
ALT: 19 U/L (ref 0–53)
AST: 15 U/L (ref 0–37)
Albumin: 4.4 g/dL (ref 3.5–5.2)
Alkaline Phosphatase: 105 U/L (ref 39–117)
BUN: 29 mg/dL — ABNORMAL HIGH (ref 6–23)
CO2: 22 mEq/L (ref 19–32)
Calcium: 10 mg/dL (ref 8.4–10.5)
Chloride: 106 mEq/L (ref 96–112)
Creatinine, Ser: 1.45 mg/dL (ref 0.40–1.50)
GFR: 51.71 mL/min — ABNORMAL LOW (ref >60.00)
Glucose, Bld: 207 mg/dL — ABNORMAL HIGH (ref 70–99)
Potassium: 4.2 mEq/L (ref 3.5–5.1)
Sodium: 139 mEq/L (ref 135–145)
Total Bilirubin: 0.5 mg/dL (ref 0.2–1.2)
Total Protein: 7.5 g/dL (ref 6.0–8.3)

## 2017-05-06 MED ORDER — INSULIN DEGLUDEC 100 UNIT/ML ~~LOC~~ SOPN: 45.0000 [IU] | PEN_INJECTOR | Freq: Every day | SUBCUTANEOUS | 3 refills | Status: DC

## 2017-05-06 MED ORDER — SEMAGLUTIDE(0.25 OR 0.5MG/DOS) 2 MG/1.5ML ~~LOC~~ SOPN: 0.2500 mg | PEN_INJECTOR | SUBCUTANEOUS | 2 refills | Status: DC

## 2017-05-06 MED ORDER — LISINOPRIL 5 MG PO TABS: 5.0000 mg | ORAL_TABLET | Freq: Every day | ORAL | 3 refills | Status: DC

## 2017-05-06 NOTE — Assessment & Plan Note (Signed)
Diabetes longstanding currently uncontrolled but improved behaviors given that he is now checking CBGs and taking medications. Patient denies hypoglycemic events and is able to verbalize appropriate hypoglycemia management plan. Patient reports adherence with medication. Control is suboptimal due to dietary indiscretion, suboptimal insulin doses, sedentary lifestyle. Following discussion and approval by Dr. [name], the following medication changes were made:  -Increase Tresiba to 45 units daily -STOP bydureon 2/2 adverse reaction of syncope and continued dizziness -Restart ozempic 0.25 mg once weekly as patient tolerated this medication when samples were provided. Patient educated on purpose, proper use and potential adverse effects of ozempic.  Following instruction patient verbalized understanding of treatment plan.  -Counseled on sick day rules for Jardiance. Patient verbalized understanding.  -Next A1C anticipated October 2018.    ASCVD risk greater than 7.5%. Continued Aspirin 81 mg and Continued atorvastatin 80 mg.

## 2017-05-06 NOTE — Assessment & Plan Note (Signed)
Hypertension longstanding currently uncontrolled however patient did not take meds this AM. Patient reports adherence with medication. Control is suboptimal due to not taking meds yet today, dietary indiscretion. -Decreased lisinopril to 5 mg daily with recent syncope and hypotension leading to ED visit -Encouraged increased fluids -Atypical dosing of long acting nitrate, will send inbasket message to cardiologist asking for clarification. -Reassess at next visit

## 2017-05-06 NOTE — Progress Notes (Signed)
S:     Chief Complaint  Patient presents with  . Medication Management    Diabetes    Patient arrives in fair spirits, ambulating without assistance.  Presents for diabetes evaluation, education, and management at the request of Dr. Adriana Simas. Patient was referred on 03/06/2017.  Patient was last seen by Primary Care Provider on 03/06/2017. Last pharmacy clinic visit was on 04/22/2017 where ozempic was switched to bydureon (formulary switch). Since last visit, pt was seen in ED for syncope. Patient attributes this to bydureon and decreased PO intake.  Today, patient continues to endorse some dizziness. Has not taken dose of Bydureon. Did not take medications this AM. Has been checking sugars every morning, lowest = 157. Otherwise, states that he has been doing better since the last time I saw him. Has not been drinking EtOH.   Patient has medicare and medicaid, meds are affordable at this time.   Family/Social History: recent death in family, lives with mother, daughter set up meter with new needle for him  Patient reports adherence with medications.  Current diabetes medications include: Tresiba 40 units, bydureon  weekly glipizide 10 mg BID, Jardiance 25 mg daily Current hypertension medications include: carvedilol 25 mg BID, furosemide 40 mg daily, isosorbide MONONITRATE 30 mg TID, lisinopril 10 mg daily, tamsulosin 0.4 mg daily, jardiance 25 mg daily.  Patient denies hypoglycemic events.  Patient reported dietary habits: Eats 1-2 meals/day Breakfast:Bacon and biscuit Lunch: cheese crackers, diet coke Dinner: cheese crackers, diet coke  Snacks: none Drinks: diet coke  Patient reported exercise habits: none at present   Patient reports nocturia. Varied - anywhere from 2-5 times Patient denies pain/burning upon urination. Patient reports neuropathy. Patient denies visual changes. Patient denies self foot exams.   O:  Physical Exam  Constitutional: He appears well-developed  and well-nourished.  Vitals reviewed.    Review of Systems  All other systems reviewed and are negative.    Lab Results  Component Value Date   HGBA1C 9.6 (H) 03/05/2017   Vitals:   05/06/17 0810 05/06/17 0825  BP: (!) 156/99 125/86  Pulse:  93    Home fasting CBG: 250s-330s, excursion to 157, 188   A/P: Diabetes longstanding currently uncontrolled but improved behaviors given that he is now checking CBGs and taking medications. Patient denies hypoglycemic events and is able to verbalize appropriate hypoglycemia management plan. Patient reports adherence with medication. Control is suboptimal due to dietary indiscretion, suboptimal insulin doses, sedentary lifestyle. Following discussion and approval by Dr. [name], the following medication changes were made:  -Increase Tresiba to 45 units daily -STOP bydureon 2/2 adverse reaction of syncope and continued dizziness -Restart ozempic 0.25 mg once weekly as patient tolerated this medication when samples were provided. Patient educated on purpose, proper use and potential adverse effects of ozempic.  Following instruction patient verbalized understanding of treatment plan.  -Counseled on sick day rules for Jardiance. Patient verbalized understanding.  -Next A1C anticipated October 2018.    ASCVD risk greater than 7.5%. Continued Aspirin 81 mg and Continued atorvastatin 80 mg.   Hypertension longstanding currently uncontrolled however patient did not take meds this AM. Patient reports adherence with medication. Control is suboptimal due to not taking meds yet today, dietary indiscretion. -Decreased lisinopril to 5 mg daily with recent syncope and hypotension leading to ED visit -Encouraged increased fluids -Atypical dosing of long acting nitrate, will send inbasket message to cardiologist asking for clarification. -Reassess at next visit  Written patient instructions provided.  Total  time in face to face counseling 45 minutes.    Follow up in Pharmacist Clinic Visit 4 weeks.  Patient was seen with Dr. [name] today in clinic and medication changes were discussed and approved prior to initiation  Allena Katz, Pharm.D. PGY2 Ambulatory Care Pharmacy Resident Phone: [phone]

## 2017-05-06 NOTE — Patient Instructions (Addendum)
Thank you for coming to see [place] today!   1. INCREASE your Evaristo Bury to 45 units in the morning  2. STOP Bydrueon, start ozempic 0.25 mg once weekly for the next 4 weeks.   3. DECREASE your lisinopril to 5 mg once a day. You can cut the 10 mg tablets in 1/2 until those are gone.   4. Drink plenty of water!!  5. Labs today. We will call you with results  6. Please schedule with a primary care doctor as soon as able so we can establish care.   7. Keep checking your sugars once a day and blood pressure once a day at different times. Bring those readings back into the office  Come back to see me in ~ 1 month.

## 2017-05-07 ENCOUNTER — Ambulatory Visit (INDEPENDENT_AMBULATORY_CARE_PROVIDER_SITE_OTHER): Payer: Medicare Other | Admitting: Family Medicine

## 2017-05-07 ENCOUNTER — Encounter: Payer: Self-pay | Admitting: Family Medicine

## 2017-05-07 VITALS — BP 108/70 | HR 89 | Temp 97.8°F | Wt 236.2 lb

## 2017-05-07 DIAGNOSIS — F32A Depression, unspecified: Secondary | ICD-10-CM | POA: Insufficient documentation

## 2017-05-07 DIAGNOSIS — F329 Major depressive disorder, single episode, unspecified: Secondary | ICD-10-CM

## 2017-05-07 DIAGNOSIS — I255 Ischemic cardiomyopathy: Secondary | ICD-10-CM

## 2017-05-07 DIAGNOSIS — I499 Cardiac arrhythmia, unspecified: Secondary | ICD-10-CM

## 2017-05-07 DIAGNOSIS — F419 Anxiety disorder, unspecified: Secondary | ICD-10-CM | POA: Insufficient documentation

## 2017-05-07 DIAGNOSIS — I951 Orthostatic hypotension: Secondary | ICD-10-CM | POA: Insufficient documentation

## 2017-05-07 NOTE — Assessment & Plan Note (Addendum)
Patient notes some anxiety and depression. Difficulty sleeping with this. Some SI though no intent or plan to harm himself. He is hesitant to see a therapist or psychiatrist. Patient's EKG with QTC that is borderline. This may limit what antidepressants he can use. I have sent a message to our clinical pharmacist to get her input. Potentially consider Lexapro or Zoloft or Remeron though they do have the potential to cause QT prolongation patient is given return precautions.

## 2017-05-07 NOTE — Assessment & Plan Note (Signed)
Suspect prior syncopal episode related orthostasis given orthostatic vital signs in the office yesterday. He was also found to be slightly dehydrated. Lab work checked and normalized. We'll have him do the lower dose of lisinopril. Consider decreasing medications further if not continuing to improve.

## 2017-05-07 NOTE — Patient Instructions (Signed)
Nice to see you. Please continue to monitor lightheadedness. We will get you follow up with your cardiologist following your syncopal episode. We are going to start you on a medication for your depression and anxiety though I want to check with our pharmacist to see what the best one for you would be giving her cardiac history. If you do not hear about that next 1-2 days from [place] please let us know. If you develop thoughts of harming yourself or others or youdevelop palpitations, chest pain, or shortness of breath please seek medical attention in the ED.

## 2017-05-07 NOTE — Progress Notes (Signed)
  Marikay Alar, MD Phone: [phone]  Danny Friends Feliz Sr. is a 66 y.o. male who presents today for follow-up.  Patient presents to establish care with me today. I did see him in the office yesterday with the pharmacist. He notes he has not gotten his diabetic medication yet though plans to get them from the pharmacy.  He was seen in the emergency room for syncope previously. Felt to possibly be related to dehydration. Otherwise had a fairly unremarkable workup. He was found to be orthostatic yesterday in the office. His lisinopril was decreased in dose and he did not take this yesterday. His blood pressure was 107/77 earlier today. He notes no chest pain or shortness of breath. Lightheadedness continues with standing up. No palpitations.  Patient does report some depression and anxiety. Notes some difficulty sleeping at times because he has things on his mind. He's been depressed for a while. He notes he worries about his grandchildren and his mother. He also had a death in the family. He does note some suicidal ideation though no plan or intent to kill himself. No HI. He has not been on medication for this in some time.  PMH: Former smoker   ROS see history of present illness  Objective  Physical Exam Vitals:   05/07/17 1430  BP: 108/70  Pulse: 89  Temp: 97.8 F (36.6 C)  SpO2: 95%    BP Readings from Last 3 Encounters:  05/07/17 108/70  05/06/17 125/86  04/30/17 123/72   Wt Readings from Last 3 Encounters:  05/07/17 236 lb 3.2 oz (107.1 kg)  05/06/17 233 lb (105.7 kg)  04/30/17 241 lb (109.3 kg)    Physical Exam  Constitutional: No distress.  Cardiovascular:  Occasional extra heartbeat noted, otherwise sounds regular, normal heart sounds  Pulmonary/Chest: Effort normal and breath sounds normal.  Musculoskeletal: He exhibits no edema.  Neurological: He is alert. Gait normal.  Skin: Skin is warm and dry. He is not diaphoretic.   EKG: Normal sinus rhythm,  nonspecific T-wave abnormality unchanged from prior, rate 88, QTc 424  Assessment/Plan: Please see individual problem list.  Orthostasis Suspect prior syncopal episode related orthostasis given orthostatic vital signs in the office yesterday. He was also found to be slightly dehydrated. Lab work checked and normalized. We'll have him do the lower dose of lisinopril. Consider decreasing medications further if not continuing to improve.  Anxiety and depression Patient notes some anxiety and depression. Difficulty sleeping with this. Some SI though no intent or plan to harm himself. He is hesitant to see a therapist or psychiatrist. We will start on patient for this.  Possible irregular heartbeat noted on exam. EKG with no evidence of arrhythmia or extra heart beats. Possibly PACs or PVCs. We'll have him follow with cardiology.  Orders Placed This Encounter  Procedures  . EKG 12-Lead   Marikay Alar, MD [facility]

## 2017-05-08 ENCOUNTER — Telehealth: Payer: Self-pay | Admitting: *Deleted

## 2017-05-08 MED ORDER — ISOSORBIDE MONONITRATE ER 30 MG PO TB24: 90.0000 mg | ORAL_TABLET | Freq: Every day | ORAL | 3 refills | Status: DC

## 2017-05-08 NOTE — Telephone Encounter (Signed)
-----   Message from Ok Anis, NP sent at 05/07/2017  6:21 PM EDT ----- Regarding: FW: Isosorbide dosing Aram Beecham,  This is a patient of Tim's.  Somehow he got on imdur 30 mg TID.  Imdur is long acting and so it should be 90 daily instead of 30 TID.  Can you please contact the patient to let him know that we are going to simplify his regimen.  As he already has imdur 30 @ home, he may take 3 tabs daily for now.  If he was already doing that, then there isn't a reason to change anything other than the sig on the Rx.    Thanks,  Thayer Ohm ----- Message ----- From: Allena Katz, [facility] Sent: 05/07/2017   5:35 PM To: Ok Anis, NP, [name], MD Subject: RE: Isosorbide dosing                          Would you mind having someone from your office reach out? That way I won't have to co-sign things to you or Dr. [name]? That would be much appreciated! I am happy to assist in any ways needed!   Rayfield Citizen ----- Message ----- From: Ok Anis, NP Sent: 05/07/2017   8:23 AM To: Allena Katz, RPH Subject: RE: Isosorbide dosing                          Hi Caroline,  Thanks for picking up on that.  Though I wasn't the original prescriber, I have a hard time believing anyone meant to use imdur TID.  Dr. Mariah Milling sometimes does interesting things with meds, but even that one is out of the norm for him. I agree that we should try and simplify this for him and switch it to 90 daily.  Would you like me to have our office reach out to him or were you planning to contact him?  Thanks for the heads up on his lisinopril.  It's tough with these folks who are semi-compliant and have meds titrated under the guise of compliance, only then to bottom out when they actually start taking their meds.  Thanks again,  Thayer Ohm ----- Message ----- From: Allena Katz, [facility] Sent: 05/06/2017   5:26 PM To: Ok Anis, NP Subject: Isosorbide dosing                               Hi,   I saw Mr. Pilar in pharmacy clinic at his PCP's office for management of DM and HTN. I noticed that his isosorbide mononitrate is dosed TID. Understand that it is under the max daily dose, just wanted to make sure this was the intended formulation to be prescribed. Adherence is a bit of an issue with Mr. Every unfortunately, though he is doing much better!   Also, please note that he was in the ED for syncope and hypotension. BP uncontrolled in office but he had not taken meds yet today before visit. Lisinopril was decreased to 5 mg daily and bydureon was d/c'd.   Thanks! Monia Sabal, Pharm.D. PGY2 Ambulatory Care Pharmacy Resident Phone: [phone]

## 2017-05-08 NOTE — Telephone Encounter (Signed)
Spoke with patient regarding medication changes and per orders instructed him that he could just take 3 pills in the morning verses three times a day. He read back information which is 3 pills once daily 90 mg total. Corrected prescription sent in to his pharmacy and he had no further questions or concerns at this time.

## 2017-05-11 ENCOUNTER — Telehealth: Payer: Self-pay | Admitting: Family Medicine

## 2017-05-11 NOTE — Telephone Encounter (Signed)
Please let the patient know that I heard back from the pharmacist and they suggested starting out with a low dose of Zoloft. I can send this to the pharmacy if the patient is willing. Thanks.

## 2017-05-11 NOTE — Telephone Encounter (Signed)
-----   Message from Allena Katz, [facility] sent at 05/08/2017  8:38 AM EDT ----- Elvina Sidle,  From what I remember and the Heart Failure pharmacist remembers, sertraline is our most studied SSRI in cardiac patients. I'm not really worried about that QTc and Erika (the HF pharmacist agrees!). You can always start low and "go slow". If you want me to look further into primary literature, I'd be more than happy to!   [place] Citizen ----- Message ----- From: [name], MD Sent: 05/07/2017   3:06 PM To: Allena Katz, RPH  Tamera Stands,   I saw Mr Rabenold today for follow-up. I guess it was already scheduled even though we saw him yesterday. He notes feeling depressed and I want to start him on an SSRI for this, though there is some vague warning about cardiac issues with zoloft and lexapro particularly surrounding risk of QT prolongation. His QTc is close to borderline at 424. I wanted to see what the best option would be given his cardiac history and recent QTc. Thanks for your help.   Minerva Areola

## 2017-05-13 NOTE — Telephone Encounter (Signed)
Tried calling, no vm 

## 2017-05-13 NOTE — Progress Notes (Signed)
I have reviewed the above note and agree. I saw this patient in clinic with the pharmacist.  Marikay Alar, M.D.

## 2017-05-14 MED ORDER — SERTRALINE HCL 50 MG PO TABS: 25.0000 mg | ORAL_TABLET | Freq: Every day | ORAL | 3 refills | Status: DC

## 2017-05-14 NOTE — Telephone Encounter (Signed)
Pt stated that pharmacy has not received this medication

## 2017-05-14 NOTE — Telephone Encounter (Signed)
Pt called back returning your call. Please advise, thank you!  Call pt @ [phone]

## 2017-05-14 NOTE — Addendum Note (Signed)
Addended by: [name] on: 05/14/2017 09:18 PM   Modules accepted: Orders

## 2017-05-14 NOTE — Telephone Encounter (Signed)
Sent to pharmacy. Needs follow-up in about one month for recheck.

## 2017-05-14 NOTE — Telephone Encounter (Signed)
Please advise 

## 2017-05-14 NOTE — Telephone Encounter (Signed)
Patient notified and states he would like rx to be sent to [facility]

## 2017-05-15 NOTE — Telephone Encounter (Signed)
Patient scheduled.

## 2017-05-20 ENCOUNTER — Other Ambulatory Visit: Payer: Self-pay | Admitting: Family Medicine

## 2017-06-11 DIAGNOSIS — Z23 Encounter for immunization: Secondary | ICD-10-CM | POA: Diagnosis not present

## 2017-06-17 ENCOUNTER — Encounter: Payer: Self-pay | Admitting: Pharmacist

## 2017-06-17 ENCOUNTER — Ambulatory Visit (INDEPENDENT_AMBULATORY_CARE_PROVIDER_SITE_OTHER): Payer: Medicare Other | Admitting: Pharmacist

## 2017-06-17 ENCOUNTER — Other Ambulatory Visit: Payer: Self-pay

## 2017-06-17 DIAGNOSIS — I130 Hypertensive heart and chronic kidney disease with heart failure and stage 1 through stage 4 chronic kidney disease, or unspecified chronic kidney disease: Secondary | ICD-10-CM | POA: Insufficient documentation

## 2017-06-17 DIAGNOSIS — I2510 Atherosclerotic heart disease of native coronary artery without angina pectoris: Secondary | ICD-10-CM | POA: Diagnosis not present

## 2017-06-17 DIAGNOSIS — Z79899 Other long term (current) drug therapy: Secondary | ICD-10-CM | POA: Diagnosis not present

## 2017-06-17 DIAGNOSIS — F329 Major depressive disorder, single episode, unspecified: Secondary | ICD-10-CM

## 2017-06-17 DIAGNOSIS — I10 Essential (primary) hypertension: Secondary | ICD-10-CM | POA: Diagnosis not present

## 2017-06-17 DIAGNOSIS — Z794 Long term (current) use of insulin: Secondary | ICD-10-CM | POA: Insufficient documentation

## 2017-06-17 DIAGNOSIS — Z7982 Long term (current) use of aspirin: Secondary | ICD-10-CM | POA: Insufficient documentation

## 2017-06-17 DIAGNOSIS — Z7902 Long term (current) use of antithrombotics/antiplatelets: Secondary | ICD-10-CM | POA: Diagnosis not present

## 2017-06-17 DIAGNOSIS — E118 Type 2 diabetes mellitus with unspecified complications: Secondary | ICD-10-CM

## 2017-06-17 DIAGNOSIS — E1165 Type 2 diabetes mellitus with hyperglycemia: Secondary | ICD-10-CM | POA: Insufficient documentation

## 2017-06-17 DIAGNOSIS — Z87891 Personal history of nicotine dependence: Secondary | ICD-10-CM | POA: Diagnosis not present

## 2017-06-17 DIAGNOSIS — F419 Anxiety disorder, unspecified: Secondary | ICD-10-CM | POA: Diagnosis not present

## 2017-06-17 DIAGNOSIS — I255 Ischemic cardiomyopathy: Secondary | ICD-10-CM

## 2017-06-17 DIAGNOSIS — J449 Chronic obstructive pulmonary disease, unspecified: Secondary | ICD-10-CM | POA: Insufficient documentation

## 2017-06-17 DIAGNOSIS — I5042 Chronic combined systolic (congestive) and diastolic (congestive) heart failure: Secondary | ICD-10-CM | POA: Diagnosis not present

## 2017-06-17 DIAGNOSIS — IMO0002 Reserved for concepts with insufficient information to code with codable children: Secondary | ICD-10-CM

## 2017-06-17 DIAGNOSIS — N183 Chronic kidney disease, stage 3 (moderate): Secondary | ICD-10-CM | POA: Insufficient documentation

## 2017-06-17 LAB — BASIC METABOLIC PANEL
Anion gap: 10 (ref 5–15)
BUN: 50 mg/dL — ABNORMAL HIGH (ref 6–20)
CO2: 23 mmol/L (ref 22–32)
Calcium: 9 mg/dL (ref 8.9–10.3)
Chloride: 103 mmol/L (ref 101–111)
Creatinine, Ser: 1.57 mg/dL — ABNORMAL HIGH (ref 0.61–1.24)
GFR calc Af Amer: 51 mL/min — ABNORMAL LOW (ref >60)
GFR calc non Af Amer: 44 mL/min — ABNORMAL LOW (ref >60)
Glucose, Bld: 309 mg/dL — ABNORMAL HIGH (ref 65–99)
Potassium: 4.8 mmol/L (ref 3.5–5.1)
Sodium: 136 mmol/L (ref 135–145)

## 2017-06-17 LAB — CBC
HCT: 50 % (ref 40.0–52.0)
Hemoglobin: 16 g/dL (ref 13.0–18.0)
MCH: 30.2 pg (ref 26.0–34.0)
MCHC: 32.1 g/dL (ref 32.0–36.0)
MCV: 94.1 fL (ref 80.0–100.0)
Platelets: 223 10*3/uL (ref 150–440)
RBC: 5.31 MIL/uL (ref 4.40–5.90)
RDW: 13 % (ref 11.5–14.5)
WBC: 8.8 10*3/uL (ref 3.8–10.6)

## 2017-06-17 LAB — POCT GLYCOSYLATED HEMOGLOBIN (HGB A1C): Hemoglobin A1C: 10.4

## 2017-06-17 LAB — GLUCOSE, CAPILLARY: Glucose-Capillary: 290 mg/dL — ABNORMAL HIGH (ref 65–99)

## 2017-06-17 MED ORDER — SEMAGLUTIDE(0.25 OR 0.5MG/DOS) 2 MG/1.5ML ~~LOC~~ SOPN: 0.2500 mg | PEN_INJECTOR | SUBCUTANEOUS | 2 refills | Status: DC

## 2017-06-17 MED ORDER — INSULIN DEGLUDEC 100 UNIT/ML ~~LOC~~ SOPN: 50.0000 [IU] | PEN_INJECTOR | Freq: Every day | SUBCUTANEOUS | 3 refills | Status: DC

## 2017-06-17 MED ORDER — CARVEDILOL 12.5 MG PO TABS: 12.5000 mg | ORAL_TABLET | Freq: Two times a day (BID) | ORAL | 3 refills | Status: DC

## 2017-06-17 MED ORDER — LISINOPRIL 2.5 MG PO TABS: 2.5000 mg | ORAL_TABLET | Freq: Every day | ORAL | 2 refills | Status: DC

## 2017-06-17 NOTE — ED Triage Notes (Signed)
Patient reports that he is dizzy.  Reports he has recent change in diabetic medicine because of elevate sugars.  States at home his cbg was 404.

## 2017-06-17 NOTE — Assessment & Plan Note (Signed)
#  Depression/Anxiety, newly started on sertraline however self d/c'd 2/2 GI upset. Pt denies SI/HI and states that mood is about the same.  -Removed med from drug list. Discussed with pt that Gi upset and HA usually subsides in 1-2 weeks of use, improvement in mood can be seen in 4-6 weeks of use.  -Dr. Birdie SonsSonnenberg to assess at next visit. Can consider a different SSRI in this patient. Paroxetine, citalopram and escitalopram have lower reported incidence of diarrhea compared to sertraline and fluoxetine.

## 2017-06-17 NOTE — Assessment & Plan Note (Signed)
#  Diabetes longstanding currently worsening and uncontrolled as evidenced by A1C. Patient denies hypoglycemic events and is able to verbalize appropriate hypoglycemia management plan. Patient denies adherence with medication. Control is suboptimal due to misunderstanding in switch of medications, dietary indiscretion, sedentary lifestyle. Pt with very poor understanding of medications, CHO influence on CBGs and importance of exercise. Following discussion and approval by Dr. Darrick Huntsmanullo, the following medication changes were made:  - Increase Tresiba to 50 units daily, discussed adding prandial insulin with largest meal - may need to happen at next visit. Patient agreeable.  - D/c bydureon and restart ozempic at 0.25 mg weekly. Sample pen provided to patient at last visit is still good (OK at room temp for 56 days until 11/25). - Counseled on sick day rules for Jardiance - A1C today - Called [facility] to reiterate all of the changes made today and ask that old Rx's be dc'd given potential for nonadherence.

## 2017-06-17 NOTE — Assessment & Plan Note (Signed)
#  ASCVD with dx of CAD. ContinuedAspirin 81mg  and Continuedatorvastatin 80mg .   #Hypertension longstanding currently controlled BEFORE any medications today. Patient reportsadherence with medication. Pt complains of frequent dizziness. -Decreased lisinopril to 2.5 mg daily - Removed from pill box -Decreased carvedilol to 12.5 mg BID - Removed from pill box -Encouraged increased fluids -Consider BMET at next visit as pt is also on KCl (with lasix) -Reassess at next visit

## 2017-06-17 NOTE — Progress Notes (Addendum)
S:     No chief complaint on file.   Patient arrives in fair spirits, ambulating without assistance.  Presents for diabetes evaluation, education, and management at the request of Dr Cook/Sonnenberg. Patient was referred on 03/06/2017.  Patient was last seen by Primary Care Provider on 05/07/2017. At that time, pt complained of depressive sx and sertraline was added.  Last Rx Clinic visit on 05/06/2017 - at that time bydureon was d/c'd and ozempic was started, [place] was increased, and lisinopril was decreased.  Additionally, cardiology changed isosorbide mononitrate to once daily dosing.   Patient reports that he self dc'd the sertraline because of intolerable diarrhea. Mood is about the same, denies SI/HI. He complains of continued dizziness and persistent nausea, no vomiting or falls. He also states that he did not know to stop the bydureon and restart ozempic so he has continued bydureon once weekly. He admits that he is illiterate.  Had NOT taken meds this AM until he got to the clinic visit because he was running late. Denies EtOH use, has been trying to do better with eating and has been testing his CBGs. Reports fastings are in the 200s, as low as 178. Not checking post prandials often but saw 398 once.   Family/Social History: Children have been fighting which causes significant stress  Insurance coverage/medication affordability: meds are free with medicare+medicaid  Patient reports adherence with medications.  Current diabetes medications include: Tresiba 45 units daily, Bydureon 2 mg weekly (misunderstood switch to ozempic), Jardiance 25 mg daily Current hypertension medications include: carvedilol 25 mg BID, furosemide 40 mg daily, isosorbide mononitrate 90 mg daily, lisinopril 5 mg daily, tamsulosin 0.4 mg daily, jardiance 25 mg daily.  Patient denies hypoglycemic events.  Patient reported dietary habits: Eats 2-3 meals/day Breakfast: sausage and two eggs, piece of bread Lunch:  none Dinner: cubed steak, macaroni  Snacks: Cheese and crackers Drinks: coke zero and water  Patient reported exercise habits: none at present   Patient reports nocturia on nights when he doesn't take a sleeping pill.  Patient denies pain/burning on urination.  Patient reports neuropathy. Patient reports visual changes. Patient reports self foot exams.   O:  Physical Exam  Constitutional: He appears well-developed and well-nourished.  Vitals reviewed.  Review of Systems  All other systems reviewed and are negative.   Lab Results  Component Value Date   HGBA1C 9.6 (H) 03/05/2017   Vitals:   06/17/17 0836  BP: 116/77  Pulse: 80     Home fasting CBG: 200s 2 hour post-prandial/random CBG: doesn't usually check, checked once = 398 after supper  A/P: #Diabetes longstanding currently worsening and uncontrolled as evidenced by A1C. Patient denies hypoglycemic events and is able to verbalize appropriate hypoglycemia management plan. Patient denies adherence with medication. Control is suboptimal due to misunderstanding in switch of medications, dietary indiscretion, sedentary lifestyle. Pt with very poor understanding of medications, CHO influence on CBGs and importance of exercise. Following discussion and approval by Dr. Darrick Huntsmanullo, the following medication changes were made:  - Increase Tresiba to 50 units daily, discussed adding prandial insulin with largest meal - may need to happen at next visit. Patient agreeable.  - D/c bydureon and restart ozempic at 0.25 mg weekly. Sample pen provided to patient at last visit is still good (OK at room temp for 56 days until 11/25). - Counseled on sick day rules for Jardiance - A1C today - Called [facility] to reiterate all of the changes made today and  ask that old Rx's be dc'd given potential for nonadherence.  #ASCVD with dx of CAD. ContinuedAspirin 81mg  and Continuedatorvastatin 80mg .   #Hypertension longstanding currently  controlled BEFORE any medications today. Patient reportsadherence with medication. Pt complains of frequent dizziness. -Decreased lisinopril to 2.5 mg daily - Removed from pill box -Decreased carvedilol to 12.5 mg BID - Removed from pill box -Encouraged increased fluids -Consider BMET at next visit as pt is also on KCl (with lasix) -Reassess at next visit  #Depression/Anxiety, newly started on sertraline however self d/c'd 2/2 GI upset. Pt denies SI/HI and states that mood is about the same.  -Removed med from drug list. Discussed with pt that Gi upset and HA usually subsides in 1-2 weeks of use, improvement in mood can be seen in 4-6 weeks of use.  -Dr. Birdie SonsSonnenberg to assess at next visit. Can consider a different SSRI in this patient. Paroxetine, citalopram and escitalopram have lower reported incidence of diarrhea compared to sertraline and fluoxetine.   Patient was seen with Dr. Darrick Huntsmanullo today in clinic and medication changes were discussed and approved prior to initiation.Written patient instructions provided.  Total time in face to face counseling 60 minutes.    Follow up in Pharmacist Clinic Visit 4 weeks, follow up with Dr. Birdie SonsSonnenberg as scheduled in 2 weeks.   Allena Katzaroline E Samule Life, Pharm.D. PGY2 Ambulatory Care Pharmacy Resident Phone: [phone]   I have reviewed the above information and agree with above.   Duncan Dulleresa Tullo, MD

## 2017-06-17 NOTE — Patient Instructions (Addendum)
Increase your Evaristo Buryresiba (Insulin) to 50 units once a day  STOP bydureon (the green and yellow pen) START ozempic (blue pen) at 0.25 mg once a week  Decrease lisinopril to 2.5 mg daily Decrease your carvedilol to 12.5 mg twice a day    Come back and see me in the afternoon in about 4 weeks. You have an appointment with Dr. Birdie SonsSonnenberg in 2 weeks.

## 2017-06-18 ENCOUNTER — Emergency Department
Admission: EM | Admit: 2017-06-18 | Discharge: 2017-06-18 | Disposition: A | Discharge status: Home / Self Care | Payer: Medicare Other | Attending: Emergency Medicine | Admitting: Emergency Medicine

## 2017-06-18 ENCOUNTER — Telehealth: Payer: Self-pay | Admitting: Family Medicine

## 2017-06-18 DIAGNOSIS — E1165 Type 2 diabetes mellitus with hyperglycemia: Secondary | ICD-10-CM | POA: Diagnosis not present

## 2017-06-18 DIAGNOSIS — R739 Hyperglycemia, unspecified: Secondary | ICD-10-CM

## 2017-06-18 LAB — GLUCOSE, CAPILLARY
Glucose-Capillary: 272 mg/dL — ABNORMAL HIGH (ref 65–99)
Glucose-Capillary: 244 mg/dL — ABNORMAL HIGH (ref 65–99)

## 2017-06-18 NOTE — ED Provider Notes (Signed)
[facility] Emergency Department Provider Note   ____________________________________________   First MD Initiated Contact with Patient 06/18/17 [phone]     (approximate)  I have reviewed the triage vital signs and the nursing notes.   HISTORY  Chief Complaint Hyperglycemia    HPI Bennett ScrapeRobert M Swatzell Sr. is a 66 y.o. male who comes into the hospital today with some high blood sugar.  He states that he checked his blood sugar and it was 444.  He states that he was unsure what was going on so he got into his car and decided to come here.  Approximately 30 minutes later his blood sugars were down to 292.  The patient had seen his doctor today and he was given prescriptions for new diabetes medications.  He reports that he has not yet picked them up so he did not take any prescriptions tonight.  He reports that he did have oyster soup which had milk in it.  He is unsure if that may have been causing his elevated blood sugars.  He reports that he feels fine but was concerned about his sugars.  The patient denies any chest pain, shortness of breath dizziness or lightheadedness.  The patient is here for evaluation.  Past Medical History:  Diagnosis Date  . CAD (coronary artery disease)    a. 01/2010 PCI of LAD; b. 09/2014 PCI/DES of mLAD due to ISR. D1 80, D2 80(jailed), LCX 391m; c. 07/2016 NSTEMI/Cath: LM nl, LAD 2533m ISR, 40d, D1 90ost, 80p, D2 90ost, RI min irregs, LCX min irregs, OM1 90 small, OM2/3 min irregs, RCA min irregs, RPLB1 90, EF 25-35%.  . Chronic combined systolic and diastolic CHF (congestive heart failure) (HCC)    a. 07/2016 Echo: EF 30%, severe septal/anterior HK, Gr1 DD.  [name]. CKD (chronic kidney disease), stage III (HCC)   . COPD (chronic obstructive pulmonary disease) (HCC)   . DM2 (diabetes mellitus, type 2) (HCC)   . Erectile dysfunction   . HLD (hyperlipidemia)   . HTN (hypertension)   . Ischemic cardiomyopathy    a. 07/2016 Echo: EF 30% w/ sev  septal/ant HK. Gr1 DD.    Patient Active Problem List   Diagnosis Date Noted  . Orthostasis 05/07/2017  . Anxiety and depression 05/07/2017  . CKD (chronic kidney disease) stage 3, GFR 30-59 ml/min (HCC) 03/06/2017  . Diabetic neuropathy (HCC) 03/06/2017  . Chronic combined systolic and diastolic CHF (congestive heart failure) (HCC) 03/05/2017  . Morbid obesity (HCC) 06/18/2016  . H/O medication noncompliance 06/18/2016  . COPD (chronic obstructive pulmonary disease) (HCC) 06/20/2015  . GERD (gastroesophageal reflux disease) 12/20/2014  . Diabetes mellitus type 2, uncontrolled, with complications (HCC) 06/05/2010  . Hyperlipidemia 02/10/2010  . Essential hypertension 02/10/2010  . CAD (coronary artery disease) 02/10/2010    Past Surgical History:  Procedure Laterality Date  . CAD: stent to the LAD    . CARDIAC CATHETERIZATION  10/01/2014  . CORONARY ANGIOPLASTY WITH STENT PLACEMENT  10/01/2014    Prior to Admission medications   Medication Sig Start Date End Date Taking? Authorizing Provider  aspirin 81 MG tablet Take 81 mg by mouth daily.      [provider]  atorvastatin (LIPITOR) 80 MG tablet Take 1 tablet (80 mg total) by mouth daily. 03/05/17   Tommie Samsook, Jayce G, DO  carvedilol (COREG) 12.5 MG tablet Take 1 tablet (12.5 mg total) 2 (two) times daily with a meal by mouth. 06/17/17   Sherlene Shamsullo, Teresa L, MD  clopidogrel (PLAVIX) 75 MG tablet Take 1 tablet (75 mg total) by mouth daily. 03/05/17   Tommie Sams, DO  furosemide (LASIX) 40 MG tablet Take 1 tablet (40 mg total) by mouth daily. 03/05/17   Tommie Sams, DO  gabapentin (NEURONTIN) 300 MG capsule Take 1 capsule (300 mg total) by mouth at bedtime. 03/05/17   Tommie Sams, DO  insulin degludec (TRESIBA FLEXTOUCH) 100 UNIT/ML SOPN FlexTouch Pen Inject 0.5 mLs (50 Units total) daily at 10 pm into the skin. 06/17/17   Sherlene Shams, MD  isosorbide mononitrate (IMDUR) 30 MG 24 hr tablet Take 3 tablets (90 mg total) by mouth  daily. 05/08/17 08/06/17  Ok Anis, NP  JARDIANCE 25 MG TABS tablet TAKE ONE TABLET BY MOUTH EVERY DAY 05/20/17   [name], MD  lisinopril (PRINIVIL,ZESTRIL) 2.5 MG tablet Take 1 tablet (2.5 mg total) daily by mouth. 06/17/17   Sherlene Shams, MD  nitroGLYCERIN (NITROSTAT) 0.4 MG SL tablet Place 0.4 mg under the tongue every 5 (five) minutes as needed for chest pain.     [provider]  potassium chloride (MICRO-K) 10 MEQ CR capsule Take 10 mEq by mouth daily.    [provider]  Semaglutide (OZEMPIC) 0.25 or 0.5 MG/DOSE SOPN Inject 0.25 mg once a week into the skin. Patient not taking: Reported on 06/17/2017 06/17/17   Sherlene Shams, MD  tamsulosin (FLOMAX) 0.4 MG CAPS capsule TAKE ONE CAPSULE BY MOUTH EVERY DAY 04/12/17   Tommie Sams, DO    Allergies Patient has no known allergies.  Family History  Problem Relation Age of Onset  . Heart attack Father        complications  . Cancer Brother     Social History Social History   Tobacco Use  . Smoking status: Former Smoker    Packs/day: 3.00    Years: 0.00    Pack years: 0.00    Types: Cigarettes    Last attempt to quit: 09/07/2009    Years since quitting: 7.7  . Smokeless tobacco: Never Used  . Tobacco comment: quit june 2011  Substance Use Topics  . Alcohol use: Yes    Comment: every other weekend  . Drug use: No    Review of Systems  Constitutional: high blood sugar Eyes: No visual changes. ENT: No sore throat. Cardiovascular: Denies chest pain. Respiratory: Denies shortness of breath. Gastrointestinal: No abdominal pain.  No nausea, no vomiting.  No diarrhea.  No constipation. Genitourinary: Negative for dysuria. Musculoskeletal: Negative for back pain. Skin: Negative for rash. Neurological: Negative for headaches, focal weakness or numbness.   ____________________________________________   PHYSICAL EXAM:  VITAL SIGNS: ED Triage Vitals [06/17/17 2337]  Enc Vitals  Group     BP 135/88     Pulse Rate 97     Resp 20     Temp 98.2 F (36.8 C)     Temp Source Oral     SpO2 94 %     Weight      Height      Head Circumference      Peak Flow      Pain Score      Pain Loc      Pain Edu?      Excl. in GC?     Constitutional: Alert and oriented. Well appearing and in mild distress. Eyes: Conjunctivae are normal. PERRL. EOMI. Head: Atraumatic. Nose: No congestion/rhinnorhea. Mouth/Throat: Mucous membranes are moist.  Oropharynx non-erythematous.  Cardiovascular: Normal rate, regular rhythm. Grossly normal heart sounds.  Good peripheral circulation. Respiratory: Normal respiratory effort.  No retractions. Lungs CTAB. Gastrointestinal: Soft and nontender. No distention.positive bowel sounds Musculoskeletal: No lower extremity tenderness nor edema.  Neurologic:  Normal speech and language.  Skin:  Skin is warm, dry and intact.  Psychiatric: Mood and affect are normal.   ____________________________________________   LABS (all labs ordered are listed, but only abnormal results are displayed)  Labs Reviewed  BASIC METABOLIC PANEL - Abnormal; Notable for the following components:      Result Value   Glucose, Bld 309 (*)    BUN 50 (*)    Creatinine, Ser 1.57 (*)    GFR calc non Af Amer 44 (*)    GFR calc Af Amer 51 (*)    All other components within normal limits  GLUCOSE, CAPILLARY - Abnormal; Notable for the following components:   Glucose-Capillary 290 (*)    All other components within normal limits  CBC  URINALYSIS, COMPLETE (UACMP) WITH MICROSCOPIC  CBG MONITORING, ED  CBG MONITORING, ED   ____________________________________________  EKG  none ____________________________________________  RADIOLOGY  No results found.  ____________________________________________   PROCEDURES  Procedure(s) performed: None  Procedures  Critical Care performed: No  ____________________________________________   INITIAL IMPRESSION /  ASSESSMENT AND PLAN / ED COURSE  As part of my medical decision making, I reviewed the following data within the electronic MEDICAL RECORD NUMBER Notes from prior ED visits and [place] Controlled Substance Database   This is a 66 year old male who comes into the hospital today with high blood sugar.  We did check some blood work and his glucose was 309.  On fingerstick it was 290.  The patient states that he has not been taking his medication and he did have some oysters soup which likely had some milk in it.  By the time the patient arrived to the room we rechecked his sugar and it was 244.  The patient states that that is around his normal blood sugar is.  He states that he has no other complaints and he just wanted to get it checked.  The patient states though that he will be picking up his medications in the morning to resume taking them.  As the patient has no other complaints and he has not been taking medications I feel it is appropriate to discharge the patient to home.  He has no white blood cell count and his remaining blood work is unremarkable.  He will be discharged to follow-up.      ____________________________________________   FINAL CLINICAL IMPRESSION(S) / ED DIAGNOSES  Final diagnoses:  Hyperglycemia     ED Discharge Orders    None       Note:  This document was prepared using Dragon voice recognition software and may include unintentional dictation errors.    Rebecka ApleyWebster, Brayam Boeke P, MD 06/18/17 [phone]

## 2017-06-18 NOTE — Telephone Encounter (Signed)
Please advise 

## 2017-06-18 NOTE — Telephone Encounter (Signed)
I was not aware that he a Medicare Part D plan - looks like [employer] is his plan, they prefer Trulicity. Patient has f/u appnt with Dr. Birdie SonsSonnenberg on 11/27 and he has enough Ozempic sample to last until then.  Recommend that Dr. Birdie SonsSonnenberg d/c Ozempic and initiate Trulicity 0.75 mg once weekly at that time. I can f/u tolerance at our next visit.  Thanks!  Rayfield Citizenaroline

## 2017-06-18 NOTE — Telephone Encounter (Signed)
Copied from CRM [phone]. Topic: General - Other >> Jun 18, 2017 10:42 AM Gerrianne ScalePayne, Chelesea Weiand L wrote: Reason for CRM: Kenard GowerDrew from Cover My Meds [phone] states that he was just speaking with Baxter HireKristen for a prior authorization

## 2017-06-18 NOTE — Discharge Instructions (Signed)
Please resume taking your medications once you have picked up your prescriptions. Please monitor your carb intake and your sugar intake.

## 2017-06-18 NOTE — Telephone Encounter (Signed)
Prior authorization for Ozempic denied on 05/16/17 and re-done today and response unfavorable today.   Please advise.

## 2017-06-25 NOTE — Progress Notes (Signed)
Cardiology Office Note  Date:  06/26/2017   ID:  Danny Scrapeobert M Guillette Sr., DOB [birth year], MRN [id number]  PCP:  Glori LuisSonnenberg, Eric G, MD   Chief Complaint  Patient presents with  . other    Follow [facility] ER; syncope. Meds reviewed by the pt's bottles. Pt. c/o dizziness.     HPI:  66 year old gentleman with a  long smoking history,  coronary artery disease,  cardiac catheterization showing severe two-vessel disease,  severe stenosis of the proximal to mid LAD, severe ostial O1 and O2 disease, moderate to severe mid left circumflex disease that appeared aneurysmal, moderate OM 2 disease ejection fraction 40%  PCI of the LAD on January 11, 2010, w smoke 4 packs a day who stopped earlier in 2011,  Chronic renal insufficiency,  poorly controlled diabetes on insulin,  hyperlipidemia who presents for routine followup of his coronary artery disease.  In follow-up he reports that his sugars have been running high, polyuria Poor diet 3 weeks ago with near syncope (syncope?) when standing up Got dizzy when he stood up  He is having Neuropathy legs in the day, on neurontin at nighttime  He is taking isosorbide 90 mg in the morning, carvedilol twice a day 12.5 mg, his Flomax in the morning, lisinopril 2.5 in the morning  Orthostatics in the office today show  blood pressure 112/77 supine heart rate 102 Blood pressure 92/60 sitting, dizzy Blood pressure 90/60 standing with no change after 3 minutes  EKG personally reviewed by myself on today's visit shows normal sinus rhythm with rate 97 bpm, nonspecific ST and T wave abnormality anterolateral leads, APCs noted  Other past medical history reviewed emergency room 05/01/2016 for chest pain. Developed after he was eating a biscuit Hospital records reviewed He was not taking his insulin, drinking Coca-Cola when he had symptoms Took several nitroglycerin, aspirin by EMS. Still had chest discomfort in the ER Cardiac enzymes negative. Symptoms  possibly GI related Reports he was not wearing oxygen  Chronic mild shortness of breath which is stable, denies  chest pain Denies any significant lower extremity edema, no PND or orthopnea He sees Dr. Carron BrazenNiemeyer's office every 3 months  Somewhat unsure about his medications, thinks he is taking them all Weight is up, eating poorly, no exercise, poor diet in general.  Prior to his last clinic visit, he had 2 bags of potato chips. He woke up later overnight with significant chest discomfort/upper epigastric pain. He took nitroglycerin which helped his symptoms for 30 minutes but pain returned. He took several nitroglycerin through the night. Symptoms better the next day, Sunday and feels well today. He has had GI issues before with symptoms of GERD. He reports that PPI was stopped by primary care some time ago. Prior episodes of syncope, was sitting at the time, witnessed by a friend. Resolved quickly with no postictal changes. No symptoms since that time several years ago  He was seen in the hospital 11/23/2014 with elevated glucose of more than 300, also with creatinine of 2, BUN 40. He was given saline IV and discharged home.  lab work from January 2016 shows creatinine 1.67, BUN 33, glucose 520, hemoglobin A1c 14, total cholesterol 168, LDL 71  PMH:   has a past medical history of CAD (coronary artery disease), Chronic combined systolic and diastolic CHF (congestive heart failure) (HCC), CKD (chronic kidney disease), stage III (HCC), COPD (chronic obstructive pulmonary disease) (HCC), DM2 (diabetes mellitus, type 2) (HCC), Erectile dysfunction, HLD (hyperlipidemia), HTN (hypertension),  and Ischemic cardiomyopathy.  PSH:    Past Surgical History:  Procedure Laterality Date  . CAD: stent to the LAD    . CARDIAC CATHETERIZATION  10/01/2014  . CARDIAC CATHETERIZATION N/A 07/23/2016   Procedure: Left Heart Cath and Coronary Angiography;  Surgeon: Iran Ouch, MD;  Location: [facility] INVASIVE  CV LAB;  Service: Cardiovascular;  Laterality: N/A;  . CORONARY ANGIOPLASTY WITH STENT PLACEMENT  10/01/2014    Current Outpatient Medications  Medication Sig Dispense Refill  . aspirin 81 MG tablet Take 81 mg by mouth daily.      [name] atorvastatin (LIPITOR) 80 MG tablet Take 1 tablet (80 mg total) by mouth daily. 90 tablet 1  . carvedilol (COREG) 12.5 MG tablet Take 1 tablet (12.5 mg total) 2 (two) times daily with a meal by mouth. 60 tablet 3  . clopidogrel (PLAVIX) 75 MG tablet Take 1 tablet (75 mg total) by mouth daily. 90 tablet 1  . furosemide (LASIX) 40 MG tablet Take 1 tablet (40 mg total) by mouth daily. 90 tablet 1  . gabapentin (NEURONTIN) 300 MG capsule Take 1 capsule (300 mg total) by mouth at bedtime. 90 capsule 1  . insulin degludec (TRESIBA FLEXTOUCH) 100 UNIT/ML SOPN FlexTouch Pen Inject 0.5 mLs (50 Units total) daily at 10 pm into the skin. 5 pen 3  . isosorbide mononitrate (IMDUR) 30 MG 24 hr tablet Take 3 tablets (90 mg total) by mouth daily. 270 tablet 3  . JARDIANCE 25 MG TABS tablet TAKE ONE TABLET BY MOUTH EVERY DAY 30 tablet 1  . lisinopril (PRINIVIL,ZESTRIL) 2.5 MG tablet Take 1 tablet (2.5 mg total) daily by mouth. 30 tablet 2  . nitroGLYCERIN (NITROSTAT) 0.4 MG SL tablet Place 0.4 mg under the tongue every 5 (five) minutes as needed for chest pain.     . potassium chloride (MICRO-K) 10 MEQ CR capsule Take 10 mEq by mouth daily.    . Semaglutide (OZEMPIC) 0.25 or 0.5 MG/DOSE SOPN Inject 0.25 mg once a week into the skin. (Patient not taking: Reported on 06/17/2017) 1 pen 2  . tamsulosin (FLOMAX) 0.4 MG CAPS capsule TAKE ONE CAPSULE BY MOUTH EVERY DAY 30 capsule 3   No current facility-administered medications for this visit.      Allergies:   Patient has no known allergies.   Social History:  The patient  reports that he quit smoking about 7 years ago. His smoking use included cigarettes. He smoked 3.00 packs per day for 0.00 years. he has never used smokeless  tobacco. He reports that he drinks alcohol. He reports that he does not use drugs.   Family History:   family history includes Cancer in his brother; Heart attack in his father.    Review of Systems: Review of Systems  Constitutional:       Polyuria  Respiratory: Negative.   Cardiovascular: Negative.   Gastrointestinal: Negative.   Musculoskeletal: Negative.   Neurological: Positive for dizziness.       Near syncope  Psychiatric/Behavioral: Negative.   All other systems reviewed and are negative.    PHYSICAL EXAM: VS:  BP 104/70 (BP Location: Left Arm, Patient Position: Sitting, Cuff Size: Normal)   Pulse 97   Ht 5\' 11"  (1.803 m)   Wt 234 lb (106.1 kg)   BMI 32.64 kg/m  , BMI Body mass index is 32.64 kg/m. GEN: Well nourished, well developed, in no acute distress , obese HEENT: normal  Neck: no JVD, carotid bruits, or masses Cardiac: RRR;  no murmurs, rubs, or gallops,no edema  Respiratory: Moderately decreased breath sounds throughout, but normal work of breathing GI: soft, nontender, nondistended, + BS MS: no deformity or atrophy  Skin: warm and dry, no rash Neuro:  Strength and sensation are intact Psych: euthymic mood, full affect    Recent Labs: 05/06/2017: ALT 19 06/17/2017: BUN 50; Creatinine, Ser 1.57; Hemoglobin 16.0; Platelets 223; Potassium 4.8; Sodium 136    Lipid Panel Lab Results  Component Value Date   CHOL 92 03/05/2017   HDL 42.10 03/05/2017   LDLCALC 35 03/05/2017   TRIG 78.0 03/05/2017      Wt Readings from Last 3 Encounters:  06/26/17 234 lb (106.1 kg)  06/17/17 232 lb (105.2 kg)  05/07/17 236 lb 3.2 oz (107.1 kg)       ASSESSMENT AND PLAN:  Coronary artery disease involving native coronary artery of native heart without angina pectoris Currently with no symptoms of angina. No further workup at this time. Continue current medication regimen. Stressed the importance of medication compliance  HYPERTENSION, BENIGN On prior office  visit was hypertensive, now blood pressure running low and having near syncope episodes Likely secondary to high glucose and polyuria Recommended he hold his diuretic to the weekend We will also decrease isosorbide from 90 mg down to 30 mg in the morning  Pure hypercholesterolemia Stressed importance of staying on his simvastatin. Goal LDL less than 70 Poor diet and diabetes  Uncontrolled type 2 diabetes mellitus with complication, with long-term current use of insulin (HCC) Managed by primary care Noncompliant with his diet and medications/insulin Polyuria, hypertension on today's visit  Centrilobular emphysema (HCC) Stop smoking 2011 Chronic mild shortness of breath, stable  Morbid obesity (HCC) Stressed importance of strict diet given his poorly controlled diabetes  Disposition:   F/U  3 months   Total encounter time more than 45 minutes  Greater than 50% was spent in counseling and coordination of care with the patient     Signed, Dossie Arbourim Gollan, M.D., Ph.D. 06/26/2017  [facility] [place] [phone]

## 2017-06-26 ENCOUNTER — Ambulatory Visit (INDEPENDENT_AMBULATORY_CARE_PROVIDER_SITE_OTHER): Payer: Medicare Other | Admitting: Cardiovascular Disease

## 2017-06-26 ENCOUNTER — Encounter: Payer: Self-pay | Admitting: Cardiovascular Disease

## 2017-06-26 VITALS — BP 104/70 | HR 97 | Ht 71.0 in | Wt 234.0 lb

## 2017-06-26 DIAGNOSIS — N183 Chronic kidney disease, stage 3 unspecified: Secondary | ICD-10-CM

## 2017-06-26 DIAGNOSIS — E1165 Type 2 diabetes mellitus with hyperglycemia: Secondary | ICD-10-CM | POA: Diagnosis not present

## 2017-06-26 DIAGNOSIS — J432 Centrilobular emphysema: Secondary | ICD-10-CM

## 2017-06-26 DIAGNOSIS — I5042 Chronic combined systolic (congestive) and diastolic (congestive) heart failure: Secondary | ICD-10-CM

## 2017-06-26 DIAGNOSIS — I25118 Atherosclerotic heart disease of native coronary artery with other forms of angina pectoris: Secondary | ICD-10-CM

## 2017-06-26 DIAGNOSIS — I209 Angina pectoris, unspecified: Secondary | ICD-10-CM

## 2017-06-26 DIAGNOSIS — I10 Essential (primary) hypertension: Secondary | ICD-10-CM | POA: Diagnosis not present

## 2017-06-26 DIAGNOSIS — IMO0002 Reserved for concepts with insufficient information to code with codable children: Secondary | ICD-10-CM

## 2017-06-26 DIAGNOSIS — E7800 Pure hypercholesterolemia, unspecified: Secondary | ICD-10-CM

## 2017-06-26 DIAGNOSIS — I255 Ischemic cardiomyopathy: Secondary | ICD-10-CM

## 2017-06-26 DIAGNOSIS — E118 Type 2 diabetes mellitus with unspecified complications: Secondary | ICD-10-CM

## 2017-06-26 DIAGNOSIS — Z9114 Patient's other noncompliance with medication regimen: Secondary | ICD-10-CM

## 2017-06-26 MED ORDER — ISOSORBIDE MONONITRATE ER 30 MG PO TB24: 30.0000 mg | ORAL_TABLET | Freq: Every day | ORAL | 3 refills | Status: DC

## 2017-06-26 NOTE — Patient Instructions (Addendum)
Medication Instructions:   Please hold the furosemide and potassium for 2 days  Decrease the isosorbide down from 3 in the Am , Down to one in the Am   Labwork:  No new labs needed  Testing/Procedures:  No further testing at this time   Follow-Up: It was a pleasure seeing you in the office today. Please call us if you have new issues that need to be addressed before your next appt.  [phone]  Your physician wants you to follow-up in: 3 months.  You will receive a reminder letter in the mail two months in advance. If you don't receive a letter, please call our office to schedule the follow-up appointment.  If you need a refill on your cardiac medications before your next appointment, please call your pharmacy.

## 2017-07-02 ENCOUNTER — Ambulatory Visit (INDEPENDENT_AMBULATORY_CARE_PROVIDER_SITE_OTHER): Payer: Medicare Other | Admitting: Family Medicine

## 2017-07-02 ENCOUNTER — Encounter: Payer: Self-pay | Admitting: Family Medicine

## 2017-07-02 ENCOUNTER — Other Ambulatory Visit: Payer: Self-pay

## 2017-07-02 VITALS — BP 124/70 | HR 85 | Temp 98.5°F | Wt 235.4 lb

## 2017-07-02 DIAGNOSIS — F419 Anxiety disorder, unspecified: Secondary | ICD-10-CM

## 2017-07-02 DIAGNOSIS — I951 Orthostatic hypotension: Secondary | ICD-10-CM | POA: Diagnosis not present

## 2017-07-02 DIAGNOSIS — E1165 Type 2 diabetes mellitus with hyperglycemia: Secondary | ICD-10-CM | POA: Diagnosis not present

## 2017-07-02 DIAGNOSIS — R0989 Other specified symptoms and signs involving the circulatory and respiratory systems: Secondary | ICD-10-CM | POA: Insufficient documentation

## 2017-07-02 DIAGNOSIS — E1142 Type 2 diabetes mellitus with diabetic polyneuropathy: Secondary | ICD-10-CM | POA: Diagnosis not present

## 2017-07-02 DIAGNOSIS — F32A Depression, unspecified: Secondary | ICD-10-CM

## 2017-07-02 DIAGNOSIS — I255 Ischemic cardiomyopathy: Secondary | ICD-10-CM

## 2017-07-02 DIAGNOSIS — E118 Type 2 diabetes mellitus with unspecified complications: Secondary | ICD-10-CM

## 2017-07-02 DIAGNOSIS — IMO0002 Reserved for concepts with insufficient information to code with codable children: Secondary | ICD-10-CM

## 2017-07-02 DIAGNOSIS — F329 Major depressive disorder, single episode, unspecified: Secondary | ICD-10-CM

## 2017-07-02 MED ORDER — ESCITALOPRAM OXALATE 5 MG PO TABS: 5.0000 mg | ORAL_TABLET | Freq: Every day | ORAL | 3 refills | Status: DC

## 2017-07-02 MED ORDER — GABAPENTIN 300 MG PO CAPS: 300.0000 mg | ORAL_CAPSULE | Freq: Every day | ORAL | 1 refills | Status: DC

## 2017-07-02 MED ORDER — CLONAZEPAM 0.5 MG PO TABS: 0.2500 mg | ORAL_TABLET | Freq: Two times a day (BID) | ORAL | 0 refills | Status: DC | PRN

## 2017-07-02 NOTE — Telephone Encounter (Signed)
I did not see this until after the patients office visit. Please call the patient and make sure he is ok starting trulicity in place of the ozempic. I'm sure he will be given that he has not been taking the ozempic, though I want to make sure.

## 2017-07-02 NOTE — Assessment & Plan Note (Signed)
Has improved to some degree with holding fluid pill and decreasing dose of Imdur.  He will continue to monitor at this time.

## 2017-07-02 NOTE — Assessment & Plan Note (Signed)
Uncontrolled.  Will continue his current regimen and we will check with our clinical pharmacist regarding his ozempic.

## 2017-07-02 NOTE — Assessment & Plan Note (Signed)
Continues to have issues with this.  We will have him take 1 tablet of gabapentin in the morning and 1 at night.

## 2017-07-02 NOTE — Patient Instructions (Signed)
Nice to see you. Lets try you on Lexapro.  This will help with anxiety and depression.  We will provide you with a very small dose of Klonopin to take when you feel anxious.  This may make you drowsy so please be careful. We will increase your gabapentin to 300 mg twice daily. Please be careful as this may make you drowsy. Please start checking your blood sugars daily. I will check with our pharmacist regarding medication assistance. If you develop a plan or intent to harm yourself please go to the emergency room immediately.

## 2017-07-02 NOTE — Assessment & Plan Note (Signed)
Patient was not able to tolerate Zoloft due to diarrhea.  He notes continued symptoms.  Does note that the Xanax did help significantly with his anxiety.  He does note intermittent passive SI though no active SI or plan or intent to harm himself.  We will start him on Lexapro.  Will provide a very small amount of Klonopin to provide some coverage for his anxiety until the Lexapro begins to work.  Advised if he developed a plan or intent to harm himself he needs to go to the emergency room.  Follow-up in 8 weeks.

## 2017-07-02 NOTE — Assessment & Plan Note (Signed)
ABIs ordered. 

## 2017-07-02 NOTE — Progress Notes (Signed)
Danny AlarEric Angalina Ante, MD Phone: [phone]  Danny Friendsobert M Engelhard Sr. is a 66 y.o. male who presents today for follow-up.  Depression/anxiety: Patient notes this is unchanged.  He tried the Zoloft though had significant diarrhea with this.  He had a friend who gave him 2 tablets of Xanax 2 nights ago.  Notes this did help with his anxiety.  He took 1 of them.  Notes it is hard to go to sleep.  Notes quite a bit of anxiety.  Does note some suicidal ideation that is passive and attributes this to his significant anxiety.  Last occurring a couple nights ago.  No suicidal ideation since then.  No intent or plan to kill himself.  Diabetes: Seen in the emergency department for hyperglycemia.  He has been taking tresiba 50 units.  He has not been taking the ozempic.  He has been taking Jardiance.  Has not been checking his CBGs as he forgets to check.  Does note some neuropathy with burning and tingling in his bilateral feet with numbness in this area.  He has been seen by cardiology for his lightheadedness.  They took him off his fluid pill and notes lightheadedness has improved quite a bit though is still intermittently present.  Nodes no lower extremity swelling since coming off the fluid pill.  Patient does note he had some itching in his right anterior upper shin which he scratched.  He wants me to look at the area.  Social History   Tobacco Use  Smoking Status Former Smoker  . Packs/day: 3.00  . Years: 0.00  . Pack years: 0.00  . Types: Cigarettes  . Last attempt to quit: 09/07/2009  . Years since quitting: 7.8  Smokeless Tobacco Never Used  Tobacco Comment   quit june 2011     ROS see history of present illness  Objective  Physical Exam Vitals:   07/02/17 1126  BP: 124/70  Pulse: 85  Temp: 98.5 F (36.9 C)  SpO2: 96%    BP Readings from Last 3 Encounters:  07/02/17 124/70  06/26/17 104/70  06/18/17 (!) 142/103   Wt Readings from Last 3 Encounters:  07/02/17 235 lb 6.4 oz (106.8  kg)  06/26/17 234 lb (106.1 kg)  06/17/17 232 lb (105.2 kg)    Physical Exam  Constitutional: No distress.  Cardiovascular: Normal rate, regular rhythm and normal heart sounds.  Pulmonary/Chest: Effort normal and breath sounds normal. No respiratory distress. He has no wheezes. He has no rales.  Musculoskeletal: He exhibits no edema.  Neurological: He is alert. Gait normal.  Skin: Skin is warm and dry. He is not diaphoretic.  Well-healing scratch linearly down right anterior upper shin with no signs of infection   Diabetic Foot Exam - Simple   Simple Foot Form Diabetic Foot exam was performed with the following findings:  Yes 07/02/2017 12:00 PM  Visual Inspection No deformities, no ulcerations, no other skin breakdown bilaterally:  Yes Sensation Testing See comments:  Yes Pulse Check See comments:  Yes Comments Decreased to monofilament and light touch, Decreased PT and DP pulses bilaterally      Assessment/Plan: Please see individual problem list.  Orthostasis Has improved to some degree with holding fluid pill and decreasing dose of Imdur.  He will continue to monitor at this time.  Diabetes mellitus type 2, uncontrolled, with complications (HCC) Uncontrolled.  Will continue his current regimen and we will check with our clinical pharmacist regarding his ozempic.  Diabetic neuropathy (HCC) Continues to have issues  with this.  We will have him take 1 tablet of gabapentin in the morning and 1 at night.  Anxiety and depression Patient was not able to tolerate Zoloft due to diarrhea.  He notes continued symptoms.  Does note that the Xanax did help significantly with his anxiety.  He does note intermittent passive SI though no active SI or plan or intent to harm himself.  We will start him on Lexapro.  Will provide a very small amount of Klonopin to provide some coverage for his anxiety until the Lexapro begins to work.  Advised if he developed a plan or intent to harm himself  he needs to go to the emergency room.  Follow-up in 8 weeks.  Decreased pedal pulses ABIs ordered.   Danny [name] was seen today for follow-up.  Diagnoses and all orders for this visit:  Decreased pedal pulses -     VAS US ABI WITH/WO TBI; Future  Orthostasis  Diabetes mellitus type 2, uncontrolled, with complications (HCC)  Diabetic polyneuropathy associated with type 2 diabetes mellitus (HCC)  Anxiety and depression  Other orders -     gabapentin (NEURONTIN) 300 MG capsule; Take 1 capsule (300 mg total) by mouth at bedtime. -     escitalopram (LEXAPRO) 5 MG tablet; Take 1 tablet (5 mg total) by mouth daily. -     clonazePAM (KLONOPIN) 0.5 MG tablet; Take 0.5 tablets (0.25 mg total) by mouth 2 (two) times daily as needed for anxiety.    No orders of the defined types were placed in this encounter.   Meds ordered this encounter  Medications  . gabapentin (NEURONTIN) 300 MG capsule    Sig: Take 1 capsule (300 mg total) by mouth at bedtime.    Dispense:  90 capsule    Refill:  1  . escitalopram (LEXAPRO) 5 MG tablet    Sig: Take 1 tablet (5 mg total) by mouth daily.    Dispense:  30 tablet    Refill:  3  . clonazePAM (KLONOPIN) 0.5 MG tablet    Sig: Take 0.5 tablets (0.25 mg total) by mouth 2 (two) times daily as needed for anxiety.    Dispense:  5 tablet    Refill:  0     Danny AlarEric Aahil Fredin, MD [facility]

## 2017-07-03 NOTE — Telephone Encounter (Signed)
Patient states he is ok with switching to trulicity

## 2017-07-04 MED ORDER — DULAGLUTIDE 0.75 MG/0.5ML ~~LOC~~ SOAJ: 0.7500 mg | SUBCUTANEOUS | 1 refills | Status: DC

## 2017-07-04 NOTE — Telephone Encounter (Signed)
Sent to pharmacy.  Please confirm with the patient that he has no history of thyroid cancer.  Please confirm that there is no family history of thyroid cancer, adrenal cancer, or parathyroid cancer. Thanks.

## 2017-07-04 NOTE — Addendum Note (Signed)
Addended by: Glori LuisSONNENBERG, Bretta Fees G on: 07/04/2017 03:47 PM   Modules accepted: Orders

## 2017-07-05 NOTE — Telephone Encounter (Signed)
Noted. He should start on the trulicity. Thanks.

## 2017-07-05 NOTE — Telephone Encounter (Signed)
notified

## 2017-07-05 NOTE — Telephone Encounter (Signed)
Patient states his brother had lung cancer. He states he does not have a history of thyroid cancer and no family hisotry of the above mentioned cancers

## 2017-07-11 ENCOUNTER — Telehealth: Payer: Self-pay | Admitting: Family Medicine

## 2017-07-11 NOTE — Telephone Encounter (Signed)
No longer on ozempic has switched to trulicity

## 2017-07-11 NOTE — Telephone Encounter (Signed)
Copied from CRM [id number]. Topic: General - Other >> Jul 11, 2017  2:07 PM Cecelia ByarsGreen, Temeka L, RMA wrote: Reason for CRM: Barb from Cover my meds is calling to check status of prior auth for Ozempic, please call Barb at [phone] Ref [id number]

## 2017-07-11 NOTE — Telephone Encounter (Signed)
Please advise 

## 2017-07-12 ENCOUNTER — Telehealth: Payer: Self-pay | Admitting: Family Medicine

## 2017-07-12 NOTE — Telephone Encounter (Signed)
Please advise 

## 2017-07-12 NOTE — Telephone Encounter (Signed)
Can you please find out what code I need to do to order this?  Thanks.

## 2017-07-12 NOTE — Telephone Encounter (Signed)
Need new order to read ABI W/WO TBI. Please and thank you!

## 2017-07-15 ENCOUNTER — Ambulatory Visit: Payer: Medicare Other | Admitting: Pharmacist

## 2017-07-17 NOTE — Telephone Encounter (Signed)
Thank you for taking care of this.

## 2017-07-17 NOTE — Telephone Encounter (Signed)
It needs to be directed to [facility]. No change required. I will send it to them. Melissa

## 2017-07-22 ENCOUNTER — Encounter: Payer: Self-pay | Admitting: Pharmacist

## 2017-07-22 ENCOUNTER — Ambulatory Visit (INDEPENDENT_AMBULATORY_CARE_PROVIDER_SITE_OTHER): Payer: Medicare Other | Admitting: Pharmacist

## 2017-07-22 VITALS — BP 129/89 | HR 83 | Ht 71.0 in | Wt 239.0 lb

## 2017-07-22 DIAGNOSIS — IMO0002 Reserved for concepts with insufficient information to code with codable children: Secondary | ICD-10-CM

## 2017-07-22 DIAGNOSIS — E1165 Type 2 diabetes mellitus with hyperglycemia: Secondary | ICD-10-CM

## 2017-07-22 DIAGNOSIS — E118 Type 2 diabetes mellitus with unspecified complications: Secondary | ICD-10-CM | POA: Diagnosis not present

## 2017-07-22 DIAGNOSIS — I255 Ischemic cardiomyopathy: Secondary | ICD-10-CM

## 2017-07-22 DIAGNOSIS — I10 Essential (primary) hypertension: Secondary | ICD-10-CM | POA: Diagnosis not present

## 2017-07-22 MED ORDER — INSULIN LISPRO 100 UNIT/ML (KWIKPEN): PEN_INJECTOR | SUBCUTANEOUS | 3 refills | Status: DC

## 2017-07-22 MED ORDER — PEN NEEDLES 32G X 6 MM MISC: 1.0000 | Freq: Two times a day (BID) | 11 refills | Status: DC

## 2017-07-22 NOTE — Patient Instructions (Addendum)
Thanks for coming to see me!   1. START humalog 5 units once a day with you biggest meal   2. Keep taking Tresiba 50 units once a day in the morning   3. Continue all your other medicines  4. Check your blood sugar twice a day, once in the morning before breakfast and 2 hours after your biggest meal of the day.   Come back to see me in ~ 1 month.

## 2017-07-22 NOTE — Assessment & Plan Note (Signed)
#  ASCVD risk - pt with clinical ASCVD with h/o CAD - Continued Aspirin 81 mg  - Continued atorvastatin 80 mg.   #Hypertension longstanding, currently controlled on current medications.  Patient reports adherence with medication.  - Continue current meds

## 2017-07-22 NOTE — Progress Notes (Signed)
    S:     Chief Complaint  Patient presents with  . Medication Management    Diabetes   Patient arrives in good spirits, ambulating without assistance.  Presents for diabetes evaluation, education, and management at the request of Dr. Birdie SonsSonnenberg, last seen by him 07/02/2017 . Patient was referred on 03/06/17.  At last Rx visit on 11/12 patient's Evaristo Buryresiba was increased, ozempic was restarted, and HTN meds were reduced. At last f/u with Dr. Birdie SonsSonnenberg, patient was found to NOT be taking ozempic and was switched to Trulicity.   Today, patient reports that he has been doing well but has been "eating like a pig". Has not been checking CBGs because he is afraid that if he sees a good value he will eat more. He has checked once since last visit and it was 171 which is the lowest he has seen in a while. Denies dizziness, falls, CP since changing meds.  Family/Social History: Children have been fighting which causes significant stress  Insurance coverage/medication affordability: meds are free with medicare+medicaid  He brings all pills and trulicity with him. Confirmed that his medications were correct with recent dose changes. He reports he never forgets [place] 50 units daily. Is willing to start extra daily shot of insulin. Wants to "do better, get it together" in the new year.  Patient reports adherence with medications.  Current diabetes medications include: Tresiba 50 units once daily, Trulicity 0.75 mg weekly, Jardiance 25 mg daily Current hypertension medications include: lisinopril 2.5 mg daily, carvedilol 12.5 mg BID, isosorbide mononitrate 90 mg daily, tamsulosin 0.4 mg daily, jardiance 25 mg daily  Patient denies hypoglycemic events.  Patient reported dietary habits: Eats 2-3 meals/day Breakfast: eggs and toast today, loves pinto beans, pizza for supper last night. Patient reported exercise habits: none  O:  Physical Exam  Constitutional: He appears well-developed and  well-nourished.  Vitals reviewed.    Review of Systems  All other systems reviewed and are negative.    Lab Results  Component Value Date   HGBA1C 10.4 06/17/2017   Vitals:   07/22/17 1324  BP: 129/89  Pulse: 83   In-office CBG 252  A/P: #Diabetes longstanding currently uncontrolled. Patient denies hypoglycemic events and is able to verbalize appropriate hypoglycemia management plan. Patient reports adherence with medication. Control is suboptimal due to nonadherence to CBG checks, sedentary lifestyle, dietary variability/indiscretion. Patient is on approximately 0.5u/kg of basal insulin. - Start humalog 5 mg once daily with largest meal. Patient educated on purpose, proper use and potential adverse effects of humalog.  Following instruction patient verbalized understanding of treatment plan.  - Continue Trulicity 0.75 mg weekly - consider increasing at next visit if stable x4 weeks. - Continue Tresiba 50 units daily for now  - Counseled on sick day rules for Jardiance and continued 25 mg daily - Next A1C anticipated 07/17/18 or later  #ASCVD risk - pt with clinical ASCVD with h/o CAD - Continued Aspirin 81 mg  - Continued atorvastatin 80 mg.   #Hypertension longstanding, currently controlled on current medications.  Patient reports adherence with medication.  - Continue current meds  Written patient instructions provided.  Total time in face to face counseling 25 minutes.    Follow up in Pharmacist Clinic Visit 1 month. Will call patient in 1-2 weeks to check on BG control with new prandial insulin  Allena Katzaroline E Deshan Hemmelgarn, Pharm.D., BCPS, CPP PGY2 Ambulatory Care Pharmacy Resident Phone: [phone]

## 2017-07-22 NOTE — Assessment & Plan Note (Signed)
#  Diabetes longstanding currently uncontrolled. Patient denies hypoglycemic events and is able to verbalize appropriate hypoglycemia management plan. Patient reports adherence with medication. Control is suboptimal due to nonadherence to CBG checks, sedentary lifestyle, dietary variability/indiscretion. Patient is on approximately 0.5u/kg of basal insulin. - Start humalog 5 mg once daily with largest meal. Patient educated on purpose, proper use and potential adverse effects of humalog.  Following instruction patient verbalized understanding of treatment plan.  - Continue Trulicity 0.75 mg weekly - consider increasing at next visit if stable x4 weeks. - Continue Tresiba 50 units daily for now  - Counseled on sick day rules for Jardiance and continued 25 mg daily - Next A1C anticipated 07/17/18 or later

## 2017-07-23 NOTE — Progress Notes (Signed)
I have reviewed the above note and agree. I was available to the pharmacist for consultation.  Demontae Antunes, MD  

## 2017-07-31 ENCOUNTER — Telehealth: Payer: Self-pay | Admitting: Pharmacist

## 2017-07-31 NOTE — Telephone Encounter (Signed)
Called patient to assess CBG control since start of prandial insulin. No answer at patient's listed phone number, unable to leave voicemail. Has f/u with Dr. Birdie SonsSonnenberg 1/16.   Allena Katzaroline E Welles, Pharm.D., BCPS PGY2 Ambulatory Care Pharmacy Resident Phone: [phone]

## 2017-08-19 ENCOUNTER — Other Ambulatory Visit: Payer: Self-pay | Admitting: Family Medicine

## 2017-08-19 NOTE — Telephone Encounter (Signed)
Last OV 07/02/17 last filled by Dr Adriana Simasook Furosemide 03/05/17 90 1rf Tamsulosin  04/12/17 30 3rf

## 2017-08-19 NOTE — Telephone Encounter (Signed)
He is check with the patient to see what he is taking the Flomax for.  Please also see if he has been taking the Lasix daily.  Previously this was held due to lightheadedness.

## 2017-08-21 ENCOUNTER — Ambulatory Visit (INDEPENDENT_AMBULATORY_CARE_PROVIDER_SITE_OTHER): Payer: Medicare Other | Admitting: Family Medicine

## 2017-08-21 ENCOUNTER — Encounter: Payer: Self-pay | Admitting: Family Medicine

## 2017-08-21 VITALS — BP 140/80 | HR 92 | Temp 97.7°F | Wt 239.6 lb

## 2017-08-21 DIAGNOSIS — J449 Chronic obstructive pulmonary disease, unspecified: Secondary | ICD-10-CM | POA: Diagnosis not present

## 2017-08-21 DIAGNOSIS — F419 Anxiety disorder, unspecified: Secondary | ICD-10-CM

## 2017-08-21 DIAGNOSIS — F329 Major depressive disorder, single episode, unspecified: Secondary | ICD-10-CM

## 2017-08-21 DIAGNOSIS — E118 Type 2 diabetes mellitus with unspecified complications: Secondary | ICD-10-CM

## 2017-08-21 DIAGNOSIS — IMO0002 Reserved for concepts with insufficient information to code with codable children: Secondary | ICD-10-CM

## 2017-08-21 DIAGNOSIS — E1165 Type 2 diabetes mellitus with hyperglycemia: Secondary | ICD-10-CM

## 2017-08-21 DIAGNOSIS — I5042 Chronic combined systolic (congestive) and diastolic (congestive) heart failure: Secondary | ICD-10-CM | POA: Diagnosis not present

## 2017-08-21 DIAGNOSIS — F32A Depression, unspecified: Secondary | ICD-10-CM

## 2017-08-21 DIAGNOSIS — R0989 Other specified symptoms and signs involving the circulatory and respiratory systems: Secondary | ICD-10-CM | POA: Diagnosis not present

## 2017-08-21 MED ORDER — ALBUTEROL SULFATE HFA 108 (90 BASE) MCG/ACT IN AERS: 2.0000 | INHALATION_SPRAY | Freq: Four times a day (QID) | RESPIRATORY_TRACT | 0 refills | Status: DC | PRN

## 2017-08-21 MED ORDER — POTASSIUM CHLORIDE ER 10 MEQ PO CPCR: 10.0000 meq | ORAL_CAPSULE | Freq: Every day | ORAL | 1 refills | Status: DC

## 2017-08-21 NOTE — Progress Notes (Signed)
Marikay AlarEric Marah Park, MD Phone: [phone]  Danny Friendsobert M Shappell Sr. is a 67 y.o. male who presents today for f/u.  Diabetes: Notes it was 96 fasting yesterday.  It has been less than 140 consistently over the last several weeks.  He is taking Trulicity 0.75 mg weekly, tresiba 50 units daily, lispro 5 units daily with his largest meal, and Jardiance.  Does note some polyuria.  No polydipsia.  No hypoglycemia.  He has cut junk food out completely.  He is eating much healthier.  CHF: Chronically on Lasix.  He takes his carvedilol and Imdur.  He notes no orthopnea or PND or leg swelling.  Patient reports a history of COPD.  He notes chronic stable shortness of breath with some coughing with exertion chronically.  Occasionally wheezes though I question whether or not this is upper airway wheezing based on exam.  He smoked 4 packs/day for 44 years.  Depression: He was started on Lexapro though he stopped it as it made him eat too much.  He notes he is doing quite a bit better.  No SI.  He never had his ABIs done.  Social History   Tobacco Use  Smoking Status Former Smoker  . Packs/day: 3.00  . Years: 0.00  . Pack years: 0.00  . Types: Cigarettes  . Last attempt to quit: 09/07/2009  . Years since quitting: 7.9  Smokeless Tobacco Never Used  Tobacco Comment   quit june 2011     ROS see history of present illness  Objective  Physical Exam Vitals:   08/21/17 0819 08/21/17 0847  BP: (!) 152/88 140/80  Pulse: 92   Temp: 97.7 F (36.5 C)   SpO2: 97%     BP Readings from Last 3 Encounters:  08/21/17 140/80  07/22/17 129/89  07/02/17 124/70   Wt Readings from Last 3 Encounters:  08/21/17 239 lb 9.6 oz (108.7 kg)  07/22/17 239 lb (108.4 kg)  07/02/17 235 lb 6.4 oz (106.8 kg)    Physical Exam  Constitutional: No distress.  Cardiovascular: Normal rate, regular rhythm and normal heart sounds.  Pulmonary/Chest: Effort normal and breath sounds normal.  Musculoskeletal: He exhibits no  edema.  Neurological: He is alert. Gait normal.  Skin: Skin is warm and dry. He is not diaphoretic.     Assessment/Plan: Please see individual problem list.  Chronic combined systolic and diastolic CHF (congestive heart failure) (HCC) Weight is stable.  No JVD on exam.  No edema.  No signs of volume overload.  He will continue his current regimen.  Potassium refill given to take with Lasix.  COPD (chronic obstructive pulmonary disease) (HCC) Question of COPD given smoking history with intermittent cough and wheezing as well as stable chronic shortness of breath.  It has been sometime since he has had PFTs.  We will order these.  Albuterol inhaler was prescribed use as needed.  Diabetes mellitus type 2, uncontrolled, with complications (HCC) Seems to be much improved based on CBGs.  He will continue his current regimen and return in 1 month for an A1c.  Decreased pedal pulses No prior he did not get the ABIs previously.  We will make sure he gets those.  Anxiety and depression Currently off medicine.  Symptoms have improved.  He will monitor.   Orders Placed This Encounter  Procedures  . POCT HgB A1C    Standing Status:   Future    Standing Expiration Date:   11/19/2017  . Pulmonary function test    Standing  Status:   Future    Standing Expiration Date:   08/21/2018    Order Specific Question:   Where should this test be performed?    Answer:   [facility]    Order Specific Question:   Full PFT: includes the following: basic spirometry, spirometry pre & post bronchodilator, diffusion capacity (DLCO), lung volumes    Answer:   Full PFT    Order Specific Question:   Methacholine challenge    Answer:   Yes    Meds ordered this encounter  Medications  . potassium chloride (MICRO-K) 10 MEQ CR capsule    Sig: Take 1 capsule (10 mEq total) by mouth daily. Take with lasix    Dispense:  90 capsule    Refill:  1  . albuterol (PROVENTIL HFA;VENTOLIN HFA) 108 (90 Base) MCG/ACT  inhaler    Sig: Inhale 2 puffs into the lungs every 6 (six) hours as needed for wheezing or shortness of breath.    Dispense:  1 Inhaler    Refill:  0     Marikay Alar, MD [facility]

## 2017-08-21 NOTE — Assessment & Plan Note (Signed)
Question of COPD given smoking history with intermittent cough and wheezing as well as stable chronic shortness of breath.  It has been sometime since he has had PFTs.  We will order these.  Albuterol inhaler was prescribed use as needed.

## 2017-08-21 NOTE — Telephone Encounter (Signed)
Please find out what he takes Flomax for.  Refill sent to pharmacy.  Thanks.

## 2017-08-21 NOTE — Patient Instructions (Addendum)
Nice to see you.   Great job on getting your blood sugars down.  Please continue your current regimen.  We will plan on having you come back in 1 month for an A1c.   Please try the albuterol inhaler for your cough and wheezing.  We will get you set up for lung function testing. Please monitor your fluid status.  If you gain weight or have trouble breathing or swelling please let us know. We will get you set up for ABIs.

## 2017-08-21 NOTE — Assessment & Plan Note (Signed)
No prior he did not get the ABIs previously.  We will make sure he gets those.

## 2017-08-21 NOTE — Assessment & Plan Note (Signed)
Currently off medicine.  Symptoms have improved.  He will monitor.

## 2017-08-21 NOTE — Assessment & Plan Note (Signed)
Seems to be much improved based on CBGs.  He will continue his current regimen and return in 1 month for an A1c.

## 2017-08-21 NOTE — Telephone Encounter (Signed)
Patient states he does not know why he is taking the tamsulosin and he does not believe he is taking the lasix

## 2017-08-21 NOTE — Assessment & Plan Note (Signed)
Weight is stable.  No JVD on exam.  No edema.  No signs of volume overload.  He will continue his current regimen.  Potassium refill given to take with Lasix.

## 2017-08-26 ENCOUNTER — Ambulatory Visit: Payer: Medicare Other | Admitting: Family Medicine

## 2017-08-29 ENCOUNTER — Other Ambulatory Visit: Payer: Self-pay | Admitting: Family Medicine

## 2017-08-29 DIAGNOSIS — J449 Chronic obstructive pulmonary disease, unspecified: Secondary | ICD-10-CM

## 2017-08-30 NOTE — Telephone Encounter (Signed)
Please ask patient at visit on monday

## 2017-09-02 ENCOUNTER — Ambulatory Visit (INDEPENDENT_AMBULATORY_CARE_PROVIDER_SITE_OTHER): Payer: Medicare Other | Admitting: Pharmacist

## 2017-09-02 ENCOUNTER — Encounter: Payer: Self-pay | Admitting: Pharmacist

## 2017-09-02 VITALS — BP 152/116 | HR 106 | Ht 71.0 in | Wt 241.0 lb

## 2017-09-02 DIAGNOSIS — E118 Type 2 diabetes mellitus with unspecified complications: Secondary | ICD-10-CM | POA: Diagnosis not present

## 2017-09-02 DIAGNOSIS — Z794 Long term (current) use of insulin: Secondary | ICD-10-CM

## 2017-09-02 DIAGNOSIS — E1165 Type 2 diabetes mellitus with hyperglycemia: Secondary | ICD-10-CM | POA: Diagnosis not present

## 2017-09-02 DIAGNOSIS — IMO0002 Reserved for concepts with insufficient information to code with codable children: Secondary | ICD-10-CM

## 2017-09-02 NOTE — Progress Notes (Signed)
I have reviewed the above note and agree. I was available to the pharmacist for consultation.  Jaxiel Kines, MD  

## 2017-09-02 NOTE — Progress Notes (Signed)
S:     Chief Complaint  Patient presents with  . Medication Management    Diabetes  . Medication Adherence    pill box    Patient arrives in good spirits, presenting with his box of medications.  Presents for diabetes evaluation, education, and management at the request of Dr. Birdie SonsSonnenberg (referred on 03/06/17).  Last Rx Clinic visit on 07/22/2017 - at that time humalog was started with biggest meal, Evaristo Buryresiba was continued at 50 units daily and Trulicity + Jardiance were continued. Last seen by primary care provider on 08/21/2016 - at that time CBGs were found to be improved per patient report and no medication changes were made for diabetes.  Patient reports he has been doing much better with his diabetes and reports he rarely sees readings in the 200s or lower than 100s.  Denies objective hypoglycemia but reports he "felt funny" one day at the store. Asks for assistance with medication pill boxes. Reports his daughter in law usually fills them but is "mad at him" right now. Identifies Evaristo Buryresiba as "green insulin" and humalog as "blue insulin". Denies falls/HA/CP/syncope, vomiting or nausea. He inquires about referral to "foot doctor and breathing test".  Insurance coverage/medication affordability: affordable at this time  Patient reports adherence with medications. Discovered via inspection of pill boxes that he is taking isosorbide mononitrate 60 mg BID Current diabetes medications include: Trulicity 0.75 mg weekly, Tresiba 50 units daily, humalog 5 units daily Current hypertension medications include: carvedilol 12.5 mg BID, lasix 40 mg daily, isosorbide mononitrate 30 mg tabs - 3 tabs daily (taking 2 tabs BID), lisinopril 2.5 mg daily  Patient denies hypoglycemic events.  Patient reported dietary habits: cut out sweets and regular sodas  Patient reported exercise habits: none   Patient denies nocturia.  Patient denies pain/burning on urination.  O:  Physical Exam  Constitutional:  He appears well-developed and well-nourished.     Review of Systems  Cardiovascular: Negative for leg swelling.     Lab Results  Component Value Date   HGBA1C 10.4 06/17/2017   Vitals:   09/02/17 1528  BP: (!) 152/116  Pulse: (!) 106    Lipid Panel     Component Value Date/Time   CHOL 92 03/05/2017 1520   TRIG 78.0 03/05/2017 1520   HDL 42.10 03/05/2017 1520   CHOLHDL 2 03/05/2017 1520   VLDL 15.6 03/05/2017 1520   LDLCALC 35 03/05/2017 1520   POCT CBG in clinic = 129 today Per patient memory: Home fasting CBG: 120s, lowest = 96; highest = 123 2 hour post-prandial/random CBG: 130s, highest = 215  A/P: #Diabetes longstanding currently with greatly improved control 2/2 to improved dietary compliance. Patient denies hypoglycemic events and is able to verbalize appropriate hypoglycemia management plan. Patient reports adherence with medication.  - Continue current meds, can consider d/c of humalog and increase in Trulicity in future if patient continues to require such low doses of prandial insulin. Trulicity was not increased at last visit as he had been on it for <4 weeks. - Counseled on sick day rules for Jardiance - Next A1C anticipated 09/17/2017, labs scheduled for 2/18  #ASCVD risk - secondary prevention as pt dx with CAD - Continued Aspirin 81 mg.  - Continued atorvastatin 80 mg.  - Patient has been referred to ABIs and PFTs per Dr. Birdie SonsSonnenberg, followed up and patient is due to be scheduled in the next week per front office staff.   #Hypertension longstanding currently uncontrolled on current meds,  found to be taking isosorbide incorrectly and lack of a nitrate-free interval may be decreasing anti-hypertensive effects.  Patient denies adherence with medication. Patient with history of hypotension on more medications and at higher doses - Educated patient on isosorbide directions - Correctly filled pill box  Written patient instructions provided.  Total time in face  to face counseling 45 minutes.    Follow up phone call after labs 09/23/2017.  Follow up in Rx clinic as needed afterward.    Allena Katz, Pharm.D., BCPS, CPP PGY2 Ambulatory Care Pharmacy Resident Phone: [phone]

## 2017-09-02 NOTE — Assessment & Plan Note (Signed)
 #  Hypertension longstanding currently uncontrolled on current meds, found to be taking isosorbide incorrectly and lack of a nitrate-free interval may be decreasing anti-hypertensive effects.  Patient denies adherence with medication. Patient with history of hypotension on more medications and at higher doses - Educated patient on isosorbide directions - Correctly filled pill box

## 2017-09-02 NOTE — Patient Instructions (Addendum)
Good job with your diabetes!!!   Continue taking your medicines like you've been.   Check your blood sugar 2 hours after you have your supper and you take the blue insulin.   Be very careful with your pills - read those instructions carefully.   You have an appointment to get your blood drawn on 2/18. I will check on you after that.

## 2017-09-02 NOTE — Assessment & Plan Note (Signed)
#  Diabetes longstanding currently with greatly improved control 2/2 to improved dietary compliance. Patient denies hypoglycemic events and is able to verbalize appropriate hypoglycemia management plan. Patient reports adherence with medication.  - Continue current meds, can consider d/c of humalog and increase in Trulicity in future if patient continues to require such low doses of prandial insulin. Trulicity was not increased at last visit as he had been on it for <4 weeks. - Counseled on sick day rules for Jardiance - Next A1C anticipated 09/17/2017, labs scheduled for 2/18  #ASCVD risk - secondary prevention as pt dx with CAD - Continued Aspirin 81 mg.  - Continued atorvastatin 80 mg.

## 2017-09-03 ENCOUNTER — Other Ambulatory Visit: Payer: Self-pay | Admitting: Internal Medicine

## 2017-09-16 ENCOUNTER — Telehealth: Payer: Self-pay

## 2017-09-16 ENCOUNTER — Other Ambulatory Visit: Payer: Self-pay | Admitting: Internal Medicine

## 2017-09-16 DIAGNOSIS — J449 Chronic obstructive pulmonary disease, unspecified: Secondary | ICD-10-CM

## 2017-09-16 NOTE — Addendum Note (Signed)
Addended by: Glori LuisSONNENBERG, ERIC G on: 09/16/2017 04:07 PM   Modules accepted: Orders

## 2017-09-16 NOTE — Telephone Encounter (Signed)
Order changed.

## 2017-09-16 NOTE — Telephone Encounter (Signed)
Left message to return call, ok for pec to speak with Britta MccreedyBarbara and get more details

## 2017-09-16 NOTE — Telephone Encounter (Signed)
Danny MccreedyBarbara states she needs a new order that says pulmonary function test [facility] only and click ancillary.

## 2017-09-16 NOTE — Telephone Encounter (Signed)
Copied from CRM [phone]. Topic: Inquiry >> Sep 16, 2017  1:55 PM Landry MellowFoltz, Melissa J wrote: Reason for BJY:[id number]:[facility] for pt.  She has questions about the order - [phone]. She can not schedule the pt until she gets more details

## 2017-09-19 ENCOUNTER — Other Ambulatory Visit: Payer: Self-pay | Admitting: Family Medicine

## 2017-09-19 ENCOUNTER — Other Ambulatory Visit: Payer: Self-pay | Admitting: Internal Medicine

## 2017-09-19 DIAGNOSIS — E1165 Type 2 diabetes mellitus with hyperglycemia: Secondary | ICD-10-CM

## 2017-09-19 DIAGNOSIS — IMO0002 Reserved for concepts with insufficient information to code with codable children: Secondary | ICD-10-CM

## 2017-09-19 DIAGNOSIS — E118 Type 2 diabetes mellitus with unspecified complications: Principal | ICD-10-CM

## 2017-09-23 ENCOUNTER — Other Ambulatory Visit (INDEPENDENT_AMBULATORY_CARE_PROVIDER_SITE_OTHER): Payer: Medicare Other

## 2017-09-23 DIAGNOSIS — E118 Type 2 diabetes mellitus with unspecified complications: Secondary | ICD-10-CM | POA: Diagnosis not present

## 2017-09-23 DIAGNOSIS — IMO0002 Reserved for concepts with insufficient information to code with codable children: Secondary | ICD-10-CM

## 2017-09-23 DIAGNOSIS — E1165 Type 2 diabetes mellitus with hyperglycemia: Secondary | ICD-10-CM | POA: Diagnosis not present

## 2017-09-23 LAB — POCT GLYCOSYLATED HEMOGLOBIN (HGB A1C): Hemoglobin A1C: 9.4

## 2017-09-24 ENCOUNTER — Other Ambulatory Visit: Payer: Self-pay | Admitting: Family Medicine

## 2017-09-24 ENCOUNTER — Ambulatory Visit: Payer: Medicare Other | Attending: Family Medicine

## 2017-09-24 NOTE — Progress Notes (Signed)
Cardiology Office Note  Date:  09/26/2017   ID:  Danny Scrape Sr., DOB 12/21/1950, MRN [id number]  PCP:  [name], MD   Chief Complaint  Patient presents with  . OTHER    3 month f/u no complaints today. Meds reviewed verbally with pt.    HPI:  67 year old gentleman with a  long smoking history,  coronary artery disease,  cardiac catheterization showing severe two-vessel disease,  severe stenosis of the proximal to mid LAD, severe ostial O1 and O2 disease, moderate to severe mid left circumflex disease that appeared aneurysmal, moderate OM 2 disease ejection fraction 40%  PCI of the LAD on January 11, 2010,  smoke 4 packs a day who stopped earlier in 2011,  Chronic renal insufficiency,  poorly controlled diabetes on insulin,  hyperlipidemia who presents for routine followup of his coronary artery disease.  In follow-up today reports that sugars have been running better Reports that he is worked on his diet Denies having orthostasis symptoms  Previous episodes of near syncope, orthostasis On his last clinic visit blood pressure was running low We decreased his isosorbide down to 30 mg daily We called the pharmacy still has isosorbide 30 mg 3 times a day Typically takes this 2 sometimes 3 times a day  Neuropathy legs in the day, on neurontin at nighttime  Previous orthostatics on last clinic visit blood pressure 112/77 supine heart rate 102 Blood pressure 92/60 sitting, dizzy Blood pressure 90/60 standing with no change after 3 minutes  EKG personally reviewed by myself on today's visit shows normal sinus rhythm with rate 99 bpm, nonspecific ST and T wave abnormality anterolateral leads, APCs noted  Other past medical history reviewed emergency room 05/01/2016 for chest pain. Developed after he was eating a biscuit Hospital records reviewed He was not taking his insulin, drinking Coca-Cola when he had symptoms Took several nitroglycerin, aspirin by EMS. Still had  chest discomfort in the ER Cardiac enzymes negative. Symptoms possibly GI related Reports he was not wearing oxygen  Chronic mild shortness of breath which is stable, denies  chest pain Denies any significant lower extremity edema, no PND or orthopnea He sees Dr. Carron Brazen office every 3 months  Somewhat unsure about his medications, thinks he is taking them all Weight is up, eating poorly, no exercise, poor diet in general.  Prior to his last clinic visit, he had 2 bags of potato chips. He woke up later overnight with significant chest discomfort/upper epigastric pain. He took nitroglycerin which helped his symptoms for 30 minutes but pain returned. He took several nitroglycerin through the night. Symptoms better the next day, Sunday and feels well today. He has had GI issues before with symptoms of GERD. He reports that PPI was stopped by primary care some time ago. Prior episodes of syncope, was sitting at the time, witnessed by a friend. Resolved quickly with no postictal changes. No symptoms since that time several years ago  He was seen in the hospital 11/23/2014 with elevated glucose of more than 300, also with creatinine of 2, BUN 40. He was given saline IV and discharged home.  lab work from January 2016 shows creatinine 1.67, BUN 33, glucose 520, hemoglobin A1c 14, total cholesterol 168, LDL 71  PMH:   has a past medical history of CAD (coronary artery disease), Chronic combined systolic and diastolic CHF (congestive heart failure) (HCC), CKD (chronic kidney disease), stage III (HCC), COPD (chronic obstructive pulmonary disease) (HCC), DM2 (diabetes mellitus, type 2) (HCC), Erectile  dysfunction, HLD (hyperlipidemia), HTN (hypertension), and Ischemic cardiomyopathy.  PSH:    Past Surgical History:  Procedure Laterality Date  . CAD: stent to the LAD    . CARDIAC CATHETERIZATION  10/01/2014  . CARDIAC CATHETERIZATION N/A 07/23/2016   Procedure: Left Heart Cath and Coronary  Angiography;  Surgeon: Iran Ouch, MD;  Location: [facility] INVASIVE CV LAB;  Service: Cardiovascular;  Laterality: N/A;  . CORONARY ANGIOPLASTY WITH STENT PLACEMENT  10/01/2014    Current Outpatient Medications  Medication Sig Dispense Refill  . albuterol (PROVENTIL HFA;VENTOLIN HFA) 108 (90 Base) MCG/ACT inhaler Inhale 2 puffs into the lungs every 6 (six) hours as needed for wheezing or shortness of breath. 1 Inhaler 0  . aspirin 81 MG tablet Take 81 mg by mouth daily.      [name] atorvastatin (LIPITOR) 80 MG tablet Take 1 tablet (80 mg total) by mouth daily. 90 tablet 1  . carvedilol (COREG) 12.5 MG tablet Take 1 tablet (12.5 mg total) 2 (two) times daily with a meal by mouth. 60 tablet 3  . clonazePAM (KLONOPIN) 0.5 MG tablet Take 0.5 tablets (0.25 mg total) by mouth 2 (two) times daily as needed for anxiety. 5 tablet 0  . clopidogrel (PLAVIX) 75 MG tablet TAKE ONE TABLET BY MOUTH EVERY DAY 90 tablet 1  . Dulaglutide (TRULICITY) 0.75 MG/0.5ML SOPN Inject 0.75 mg into the skin once a week. 12 pen 1  . furosemide (LASIX) 40 MG tablet TAKE ONE TABLET BY MOUTH EVERY DAY 90 tablet 1  . insulin degludec (TRESIBA FLEXTOUCH) 100 UNIT/ML SOPN FlexTouch Pen Inject 0.5 mLs (50 Units total) daily at 10 pm into the skin. 5 pen 3  . insulin lispro (HUMALOG KWIKPEN) 100 UNIT/ML KiwkPen Inject 5 units under the skin with your biggest meal once a day. 3 mL 3  . Insulin Pen Needle (PEN NEEDLES) 32G X 6 MM MISC 1 each by Does not apply route 2 (two) times daily. 100 each 11  . isosorbide mononitrate (IMDUR) 30 MG 24 hr tablet Take 1 tablet (30 mg total) by mouth daily. (Patient taking differently: Take 60 mg by mouth 2 (two) times daily. ) 90 tablet 3  . JARDIANCE 25 MG TABS tablet TAKE ONE TABLET BY MOUTH EVERY DAY 30 tablet 3  . lisinopril (PRINIVIL,ZESTRIL) 2.5 MG tablet TAKE ONE TABLET BY MOUTH EVERY DAY 90 tablet 1  . nitroGLYCERIN (NITROSTAT) 0.4 MG SL tablet Place 0.4 mg under the tongue every 5 (five)  minutes as needed for chest pain.     . potassium chloride (MICRO-K) 10 MEQ CR capsule Take 1 capsule (10 mEq total) by mouth daily. Take with lasix 90 capsule 1  . tamsulosin (FLOMAX) 0.4 MG CAPS capsule TAKE ONE CAPSULE BY MOUTH EVERY DAY 30 capsule 3   No current facility-administered medications for this visit.      Allergies:   Patient has no known allergies.   Social History:  The patient  reports that he quit smoking about 8 years ago. His smoking use included cigarettes. He smoked 3.00 packs per day for 0.00 years. he has never used smokeless tobacco. He reports that he drinks alcohol. He reports that he does not use drugs.   Family History:   family history includes Cancer in his brother; Heart attack in his father.    Review of Systems: Review of Systems  Constitutional:       Polyuria  Respiratory: Negative.   Cardiovascular: Negative.   Gastrointestinal: Negative.   Musculoskeletal: Negative.  Neurological: Negative.   Psychiatric/Behavioral: Negative.   All other systems reviewed and are negative.    PHYSICAL EXAM: VS:  BP 100/64 (BP Location: Left Arm, Patient Position: Sitting, Cuff Size: Normal)   Pulse 99   Ht 5\' 11"  (1.803 m)   Wt 238 lb 4 oz (108.1 kg)   BMI 33.23 kg/m  , BMI Body mass index is 33.23 kg/m. Constitutional:  oriented to person, place, and time. No distress.  HENT:  Head: Normocephalic and atraumatic.  Eyes:  no discharge. No scleral icterus.  Neck: Normal range of motion. Neck supple. No JVD present.  Cardiovascular: Normal rate, regular rhythm, normal heart sounds and intact distal pulses. Exam reveals no gallop and no friction rub. No edema No murmur heard. Pulmonary/Chest: Effort normal and breath sounds normal. No stridor. No respiratory distress.  no wheezes.  no rales.  no tenderness.  Abdominal: Soft.  no distension.  no tenderness.  Musculoskeletal: Normal range of motion.  no  tenderness or deformity.  Neurological:  normal  muscle tone. Coordination normal. No atrophy Skin: Skin is warm and dry. No rash noted. not diaphoretic.  Psychiatric:  normal mood and affect. behavior is normal. Thought content normal.   Recent Labs: 05/06/2017: ALT 19 06/17/2017: BUN 50; Creatinine, Ser 1.57; Hemoglobin 16.0; Platelets 223; Potassium 4.8; Sodium 136    Lipid Panel Lab Results  Component Value Date   CHOL 92 03/05/2017   HDL 42.10 03/05/2017   LDLCALC 35 03/05/2017   TRIG 78.0 03/05/2017      Wt Readings from Last 3 Encounters:  09/26/17 238 lb 4 oz (108.1 kg)  09/02/17 241 lb (109.3 kg)  08/21/17 239 lb 9.6 oz (108.7 kg)       ASSESSMENT AND PLAN:  Coronary artery disease involving native coronary artery of native heart without angina pectoris Currently with no symptoms of angina. No further workup at this time. Continue current medication regimen.  Stable   HYPERTENSION, BENIGN Decreased pulsations radial and brachial bilaterally Uncertain if this is secondary to hypotension We have also ordered carotid ultrasound with focus on subclavian arteries and flow down to radial arteries Still taking isosorbide 30 mg 2 or 3 times per day Recommend he decrease the dose down to 30 mg daily  Pure hypercholesterolemia Cholesterol is at goal on the current lipid regimen. No changes to the medications were made.  Uncontrolled type 2 diabetes mellitus with complication, with long-term current use of insulin (HCC) Reports that his numbers have improved, now watching his diet  Centrilobular emphysema (HCC) Stop smoking 2011 Chronic mild shortness of breath, stable Stable  Morbid obesity (HCC) Stressed importance of strict diet given his poorly controlled diabetes Weight has been dropping with better diet  Disposition:   F/U  6 months   Total encounter time more than 25 minutes  Greater than 50% was spent in counseling and coordination of care with the patient     Signed, Dossie Arbourim Gollan, M.D.,  Ph.D. 09/26/2017  [facility], [place] [phone]

## 2017-09-25 NOTE — Telephone Encounter (Signed)
Last OV 08/21/17 last filled by Dr.Cook 03/05/17 90 1rf

## 2017-09-26 ENCOUNTER — Ambulatory Visit (INDEPENDENT_AMBULATORY_CARE_PROVIDER_SITE_OTHER): Payer: Medicare Other | Admitting: Cardiovascular Disease

## 2017-09-26 ENCOUNTER — Encounter: Payer: Self-pay | Admitting: Cardiovascular Disease

## 2017-09-26 VITALS — BP 100/64 | HR 99 | Ht 71.0 in | Wt 238.2 lb

## 2017-09-26 DIAGNOSIS — E7800 Pure hypercholesterolemia, unspecified: Secondary | ICD-10-CM | POA: Diagnosis not present

## 2017-09-26 DIAGNOSIS — J432 Centrilobular emphysema: Secondary | ICD-10-CM

## 2017-09-26 DIAGNOSIS — R0989 Other specified symptoms and signs involving the circulatory and respiratory systems: Secondary | ICD-10-CM

## 2017-09-26 DIAGNOSIS — I5042 Chronic combined systolic (congestive) and diastolic (congestive) heart failure: Secondary | ICD-10-CM | POA: Diagnosis not present

## 2017-09-26 DIAGNOSIS — E1165 Type 2 diabetes mellitus with hyperglycemia: Secondary | ICD-10-CM | POA: Diagnosis not present

## 2017-09-26 DIAGNOSIS — I25118 Atherosclerotic heart disease of native coronary artery with other forms of angina pectoris: Secondary | ICD-10-CM

## 2017-09-26 DIAGNOSIS — I255 Ischemic cardiomyopathy: Secondary | ICD-10-CM

## 2017-09-26 DIAGNOSIS — N183 Chronic kidney disease, stage 3 unspecified: Secondary | ICD-10-CM

## 2017-09-26 DIAGNOSIS — E118 Type 2 diabetes mellitus with unspecified complications: Secondary | ICD-10-CM

## 2017-09-26 DIAGNOSIS — I10 Essential (primary) hypertension: Secondary | ICD-10-CM

## 2017-09-26 DIAGNOSIS — IMO0002 Reserved for concepts with insufficient information to code with codable children: Secondary | ICD-10-CM

## 2017-09-26 MED ORDER — ISOSORBIDE MONONITRATE ER 30 MG PO TB24: 30.0000 mg | ORAL_TABLET | Freq: Every day | ORAL | 3 refills | Status: DC

## 2017-09-26 NOTE — Progress Notes (Signed)
Spoke with pharmacy and confirmed dosage of isosorbide mononitrate. Advised that we updated prescription and he should be taking Isosorbide mononitrate 30 mg once daily and they read back and discontinued all previous orders.

## 2017-09-26 NOTE — Patient Instructions (Addendum)
Medication Instructions:   Please decrease the isosorbide down to 30 mg daily  Labwork:  No new labs needed  Testing/Procedures:  We will order a carotid u/s, b/l subclavian artery flow Decrease pulses in arms   Follow-Up: It was a pleasure seeing you in the office today. Please call us if you have new issues that need to be addressed before your next appt.  [phone]  Your physician wants you to follow-up in: 12 months.  You will receive a reminder letter in the mail two months in advance. If you don't receive a letter, please call our office to schedule the follow-up appointment.  If you need a refill on your cardiac medications before your next appointment, please call your pharmacy.  For educational health videos Log in to : [email or web address] Or : [email or web address], password : [email or web address]

## 2017-09-27 ENCOUNTER — Other Ambulatory Visit: Payer: Self-pay | Admitting: Cardiovascular Disease

## 2017-09-27 DIAGNOSIS — R55 Syncope and collapse: Secondary | ICD-10-CM

## 2017-09-27 DIAGNOSIS — R0989 Other specified symptoms and signs involving the circulatory and respiratory systems: Secondary | ICD-10-CM

## 2017-10-02 ENCOUNTER — Other Ambulatory Visit: Payer: Self-pay | Admitting: Cardiovascular Disease

## 2017-10-02 ENCOUNTER — Ambulatory Visit (INDEPENDENT_AMBULATORY_CARE_PROVIDER_SITE_OTHER): Payer: Medicare Other

## 2017-10-02 DIAGNOSIS — R55 Syncope and collapse: Secondary | ICD-10-CM

## 2017-10-07 ENCOUNTER — Other Ambulatory Visit: Payer: Self-pay | Admitting: Family Medicine

## 2017-10-07 NOTE — Telephone Encounter (Signed)
Last OV 08/21/17 I do not see this in his chart

## 2017-10-08 NOTE — Telephone Encounter (Signed)
I do not believe he was on this previously.  Please see if he has been using this.  He was supposed to have PFTs completed and they were ordered in January.  Can you follow-up with Rasheedah try to get these scheduled?  Thanks.

## 2017-10-09 NOTE — Telephone Encounter (Signed)
Refill given.  Please check with Rasheedah to get this scheduled.

## 2017-10-09 NOTE — Telephone Encounter (Signed)
Patient states he has been on this. Patient states he was scheduled but he forgot about the appointment. He states he went to his cardiologist appointment and was concerned about his heart sp he forgot the PFT.He would like this to be rescheduled for him.

## 2017-10-10 NOTE — Telephone Encounter (Signed)
Can we reschedule his PFT?

## 2017-10-14 ENCOUNTER — Encounter: Payer: Self-pay | Admitting: Pharmacist

## 2017-10-14 ENCOUNTER — Ambulatory Visit (INDEPENDENT_AMBULATORY_CARE_PROVIDER_SITE_OTHER): Payer: Medicare HMO | Admitting: Pharmacist

## 2017-10-14 VITALS — BP 121/79 | HR 98 | Ht 71.0 in | Wt 237.0 lb

## 2017-10-14 DIAGNOSIS — I255 Ischemic cardiomyopathy: Secondary | ICD-10-CM | POA: Diagnosis not present

## 2017-10-14 DIAGNOSIS — E118 Type 2 diabetes mellitus with unspecified complications: Secondary | ICD-10-CM

## 2017-10-14 DIAGNOSIS — Z794 Long term (current) use of insulin: Secondary | ICD-10-CM | POA: Diagnosis not present

## 2017-10-14 DIAGNOSIS — E1165 Type 2 diabetes mellitus with hyperglycemia: Secondary | ICD-10-CM

## 2017-10-14 DIAGNOSIS — IMO0002 Reserved for concepts with insufficient information to code with codable children: Secondary | ICD-10-CM

## 2017-10-14 MED ORDER — DULAGLUTIDE 1.5 MG/0.5ML ~~LOC~~ SOAJ: 1.5000 mg | SUBCUTANEOUS | 3 refills | Status: DC

## 2017-10-14 MED ORDER — INSULIN LISPRO 100 UNIT/ML (KWIKPEN): PEN_INJECTOR | SUBCUTANEOUS | 3 refills | Status: DC

## 2017-10-14 NOTE — Patient Instructions (Signed)
Thanks for coming in! Good to see you. Your A1C is better but still too high.   1. Give yourself another 0.75 mg of Trulicity today. Go pick up the higher dose of Trulicity (triangle base) at 1.5 mg once a week. Start this next week.   2. Increase your supper insulin (humalog) to 10 units with supper.   Come back to see me in 1 month.

## 2017-10-14 NOTE — Progress Notes (Signed)
S:     Chief Complaint  Patient presents with  . Medication Management    diabetes    Patient arrives nearly 1 hour late after confusion of about appt time due to daylight savings.  Presents for diabetes evaluation, education, and management at the request of Dr. Birdie SonsSonnenberg (referred on 03/06/2018). Last seen by primary care provider on 08/21/2017. Last Rx Clinic visit on 09/02/2017 - at that time no changes were made to medications. On 09/23/2017, A1C was found to be improved but still uncontrolled.   Today, patient reports he is "doing ok", daughter in law is filling pill box for him again. Verbalizes understanding of medication changes made by his cardiologist at last visit on 09/26/17. States that his CBGs are in the 300s in the morning on occasion, more often 200s, does not bring his meter in for review. States he gave Trulicity 0.75 mg today. Reports he is receptive to increasing this dose. Requests 90 day supplies of medications.  Insurance coverage/medication affordability: affordable at this time  Patient reports adherence with medications.  Current diabetes medications include: Trulicity 0.75 mg weekly, Tresiba 50 units daily, humalog 5 units daily Current hypertension medications include: carvedilol 12.5 mg BID, lasix 40 mg daily, isosorbide mononitrate 30 mg tabs - 1 tabs daily, lisinopril 2.5 mg daily  Patient denies hypoglycemic events.  Patient reported dietary habits: Eats 1-2 large meals/day  Patient reported exercise habits: none at present, limited by neuropathy in legs   Patient denies nocturia.  Patient denies pain/burning on urination.  Patient reports neuropathy. Patient denies visual changes. Patient reports self foot exams.    O:  Physical Exam  Constitutional: He appears well-developed and well-nourished.   Review of Systems  All other systems reviewed and are negative.    Lab Results  Component Value Date   HGBA1C 9.4 09/23/2017   Vitals:   10/14/17 1513  BP: 121/79  Pulse: 98    Lipid Panel     Component Value Date/Time   CHOL 92 03/05/2017 1520   TRIG 78.0 03/05/2017 1520   HDL 42.10 03/05/2017 1520   CHOLHDL 2 03/05/2017 1520   VLDL 15.6 03/05/2017 1520   LDLCALC 35 03/05/2017 1520    A/P: #Diabetes longstanding currently with improved control however still uncontrolled as evidenced by A1C and reported CBGs. Patient denies hypoglycemic events and is able to verbalize appropriate hypoglycemia management plan. Patient reports adherence with medication.  - Increase Trulicity to 1.5 mg weekly.  - Increase humalog to 10 units with largest meal of the day - Continue Tresiba 50 units daily.  - Continue Jardiance 25 mg daily. Counseled on sick day rules for Jardiance - Next A1C anticipated Dec 21 2017 or later   #ASCVD risk - secondary prevention as pt dx with CAD - Continued Aspirin 81 mg.  - Continued atorvastatin 80 mg.   #Hypertension longstanding currently controlled on current meds.  Patient reports adherence with medication. Patient with history of hypotension on more medications and at higher doses.  -Continues on current meds, under management of cardiology in primary care provider   Written patient instructions provided.  Total time in face to face counseling 25 minutes.    Follow up in Pharmacist Clinic Visit 1 month  Allena Katzaroline E Welles, Vermont[place].D., BCPS, CPP PGY2 Ambulatory Care Pharmacy Resident Phone: [phone]   -----------------------------------------------------------------------------------------------------------  Called patient to follow up on CBGs since increase in humalog. No answer at number [phone] and unable to leave message.   Paris Lorearoline E  Mariam Dollar, Pharm.D., BCPS PGY2 Ambulatory Care Pharmacy Resident Phone: [phone]

## 2017-10-15 NOTE — Progress Notes (Signed)
I have reviewed the above note and agree. I was available to the pharmacist for consultation.  Layne Dilauro, MD  

## 2017-10-15 NOTE — Assessment & Plan Note (Signed)
#  Diabetes longstanding currently with improved control however still uncontrolled as evidenced by A1C and reported CBGs. Patient denies hypoglycemic events and is able to verbalize appropriate hypoglycemia management plan. Patient reports adherence with medication.  - Increase Trulicity to 1.5 mg weekly.  - Increase humalog to 10 units with largest meal of the day - Continue Tresiba 50 units daily.  - Continue Jardiance 25 mg daily. Counseled on sick day rules for Jardiance - Next A1C anticipated Dec 21 2017 or later

## 2017-10-15 NOTE — Assessment & Plan Note (Signed)
 #  ASCVD risk - secondary prevention as pt dx with CAD - Continued Aspirin 81 mg.  - Continued atorvastatin 80 mg.   #Hypertension longstanding currently controlled on current meds.  Patient reports adherence with medication. Patient with history of hypotension on more medications and at higher doses.  -Continues on current meds, under management of cardiology in primary care provider

## 2017-10-17 ENCOUNTER — Ambulatory Visit: Payer: Medicare HMO | Attending: Family Medicine

## 2017-10-17 DIAGNOSIS — J449 Chronic obstructive pulmonary disease, unspecified: Secondary | ICD-10-CM | POA: Diagnosis not present

## 2017-10-17 MED ORDER — ALBUTEROL SULFATE (2.5 MG/3ML) 0.083% IN NEBU
2.5000 mg | INHALATION_SOLUTION | Freq: Once | RESPIRATORY_TRACT | Status: AC
  Administered 2017-10-17: 2.5 mg via RESPIRATORY_TRACT
  Filled 2017-10-17: qty 3

## 2017-11-02 ENCOUNTER — Other Ambulatory Visit: Payer: Self-pay

## 2017-11-02 ENCOUNTER — Emergency Department
Admission: EM | Admit: 2017-11-02 | Discharge: 2017-11-02 | Disposition: A | Discharge status: Home / Self Care | Payer: Medicare HMO | Attending: Emergency Medicine | Admitting: Emergency Medicine

## 2017-11-02 ENCOUNTER — Encounter: Payer: Self-pay | Admitting: Emergency Medicine

## 2017-11-02 DIAGNOSIS — R42 Dizziness and giddiness: Secondary | ICD-10-CM | POA: Diagnosis not present

## 2017-11-02 DIAGNOSIS — Z5321 Procedure and treatment not carried out due to patient leaving prior to being seen by health care provider: Secondary | ICD-10-CM | POA: Insufficient documentation

## 2017-11-02 LAB — BASIC METABOLIC PANEL
Anion gap: 13 (ref 5–15)
BUN: 33 mg/dL — ABNORMAL HIGH (ref 6–20)
CO2: 22 mmol/L (ref 22–32)
Calcium: 9 mg/dL (ref 8.9–10.3)
Chloride: 106 mmol/L (ref 101–111)
Creatinine, Ser: 1.49 mg/dL — ABNORMAL HIGH (ref 0.61–1.24)
GFR calc Af Amer: 55 mL/min — ABNORMAL LOW (ref >60)
GFR calc non Af Amer: 47 mL/min — ABNORMAL LOW (ref >60)
Glucose, Bld: 243 mg/dL — ABNORMAL HIGH (ref 65–99)
Potassium: 3.5 mmol/L (ref 3.5–5.1)
Sodium: 141 mmol/L (ref 135–145)

## 2017-11-02 LAB — CBC
HCT: 51.8 % (ref 40.0–52.0)
Hemoglobin: 16.5 g/dL (ref 13.0–18.0)
MCH: 30.2 pg (ref 26.0–34.0)
MCHC: 31.9 g/dL — ABNORMAL LOW (ref 32.0–36.0)
MCV: 94.7 fL (ref 80.0–100.0)
Platelets: 276 10*3/uL (ref 150–440)
RBC: 5.47 MIL/uL (ref 4.40–5.90)
RDW: 13.1 % (ref 11.5–14.5)
WBC: 9.7 10*3/uL (ref 3.8–10.6)

## 2017-11-02 LAB — GLUCOSE, CAPILLARY: Glucose-Capillary: 222 mg/dL — ABNORMAL HIGH (ref 65–99)

## 2017-11-02 NOTE — ED Triage Notes (Signed)
Pt arrived via POV dropped off by son.  Pt c/o dizziness x 3 days worse when moving around.  Pt denies any pain.  Pt c/o dizziness at times when sitting down as well.   Pt states he feels nauseated and denies vomiting. Pt also states he has had trouble with his oxygen.  Pt states he has COPD.

## 2017-11-04 ENCOUNTER — Telehealth: Payer: Self-pay | Admitting: Emergency Medicine

## 2017-11-04 NOTE — Telephone Encounter (Signed)
Called patient due to lwot to inquire about condition and follow up plans. Unable to reach. Not taking calls.

## 2017-11-06 ENCOUNTER — Other Ambulatory Visit: Payer: Self-pay | Admitting: Internal Medicine

## 2017-11-06 DIAGNOSIS — I10 Essential (primary) hypertension: Secondary | ICD-10-CM

## 2017-11-18 ENCOUNTER — Ambulatory Visit: Payer: Medicare Other | Admitting: Pharmacist

## 2017-11-22 ENCOUNTER — Other Ambulatory Visit: Payer: Self-pay | Admitting: Family Medicine

## 2017-11-22 DIAGNOSIS — E1165 Type 2 diabetes mellitus with hyperglycemia: Secondary | ICD-10-CM

## 2017-11-22 DIAGNOSIS — E118 Type 2 diabetes mellitus with unspecified complications: Principal | ICD-10-CM

## 2017-11-22 DIAGNOSIS — IMO0002 Reserved for concepts with insufficient information to code with codable children: Secondary | ICD-10-CM

## 2017-11-25 ENCOUNTER — Ambulatory Visit (INDEPENDENT_AMBULATORY_CARE_PROVIDER_SITE_OTHER): Payer: Medicare HMO | Admitting: Pharmacist

## 2017-11-25 ENCOUNTER — Ambulatory Visit (INDEPENDENT_AMBULATORY_CARE_PROVIDER_SITE_OTHER): Payer: Medicare HMO | Admitting: Family Medicine

## 2017-11-25 ENCOUNTER — Ambulatory Visit (INDEPENDENT_AMBULATORY_CARE_PROVIDER_SITE_OTHER): Payer: Medicare HMO

## 2017-11-25 VITALS — BP 147/92 | HR 99 | Temp 97.5°F

## 2017-11-25 DIAGNOSIS — E118 Type 2 diabetes mellitus with unspecified complications: Secondary | ICD-10-CM | POA: Diagnosis not present

## 2017-11-25 DIAGNOSIS — J441 Chronic obstructive pulmonary disease with (acute) exacerbation: Secondary | ICD-10-CM

## 2017-11-25 DIAGNOSIS — IMO0002 Reserved for concepts with insufficient information to code with codable children: Secondary | ICD-10-CM

## 2017-11-25 DIAGNOSIS — J449 Chronic obstructive pulmonary disease, unspecified: Secondary | ICD-10-CM

## 2017-11-25 DIAGNOSIS — E1165 Type 2 diabetes mellitus with hyperglycemia: Secondary | ICD-10-CM

## 2017-11-25 DIAGNOSIS — Z794 Long term (current) use of insulin: Secondary | ICD-10-CM

## 2017-11-25 DIAGNOSIS — J984 Other disorders of lung: Secondary | ICD-10-CM | POA: Diagnosis not present

## 2017-11-25 MED ORDER — PREDNISONE 20 MG PO TABS: 40.0000 mg | ORAL_TABLET | Freq: Every day | ORAL | 0 refills | Status: DC

## 2017-11-25 MED ORDER — DOXYCYCLINE HYCLATE 100 MG PO TABS: 100.0000 mg | ORAL_TABLET | Freq: Two times a day (BID) | ORAL | 0 refills | Status: DC

## 2017-11-25 MED ORDER — ALBUTEROL SULFATE HFA 108 (90 BASE) MCG/ACT IN AERS: 2.0000 | INHALATION_SPRAY | Freq: Four times a day (QID) | RESPIRATORY_TRACT | 0 refills | Status: DC | PRN

## 2017-11-25 MED ORDER — ALBUTEROL SULFATE (2.5 MG/3ML) 0.083% IN NEBU
2.5000 mg | INHALATION_SOLUTION | Freq: Once | RESPIRATORY_TRACT | Status: AC
  Administered 2017-11-25: 2.5 mg via RESPIRATORY_TRACT

## 2017-11-25 NOTE — Telephone Encounter (Signed)
Pharmacy calling to check status on the refill of insulin degludec (TRESIBA FLEXTOUCH) 100 UNIT/ML SOPN FlexTouch Pen

## 2017-11-25 NOTE — Patient Instructions (Signed)
Nice to see you. You likely have a COPD exacerbation.  We will get a chest x-ray today. We will treat you with prednisone and doxycycline.  He is monitor your blood sugar closely while on the prednisone and if it increases above 300 please contact us. If you develop worsening shortness of breath or you develop chest pain or cough productive of blood or fevers please go to the emergency room.

## 2017-11-25 NOTE — Progress Notes (Signed)
    S:     Chief Complaint  Patient presents with  . Medication Management    Diabetes   Patient arrives in fair spirits, stating "I'm sick, I need to see the doctor".  Presents for diabetes evaluation, education, and management at the request of Dr. Birdie SonsSonnenberg (referred on 03/06/17). Last seen by primary care provider on 08/21/16. Last Rx Clinic visit on 10/14/17 - at that time Trulicity increased and humalog increased.   Patient reports he has had a productive cough and SOB x 1 month. Reports he was seen in the ED for this x1 but no prescriptions were given. Reports he "hasn't bothered" with CBG checks but endorses 100% adherence to medications. Denies s/sx of hypoglycemia. Reports he is eating 2 meals/day - breakfast is typically bacon and eggs. Checked CBG in clinic today ~6 hours after last meal = 198 mg/dL.   Patient's cough and SOB was assessed by Dr. Birdie SonsSonnenberg. Please see accompanying note.   O:   Review of Systems  Constitutional: Negative for chills.  Respiratory: Positive for cough, sputum production ("yellow and white"), shortness of breath and wheezing.   Cardiovascular: Negative for chest pain and leg swelling.     Lab Results  Component Value Date   HGBA1C 9.4 09/23/2017   Vitals:   11/25/17 1312  BP: (!) 147/92  Pulse: 99  Temp: (!) 97.5 F (36.4 C)  SpO2: 96%    Lipid Panel     Component Value Date/Time   CHOL 92 03/05/2017 1520   TRIG 78.0 03/05/2017 1520   HDL 42.10 03/05/2017 1520   CHOLHDL 2 03/05/2017 1520   VLDL 15.6 03/05/2017 1520   LDLCALC 35 03/05/2017 1520   A/P: #Diabetes longstanding currently uncontrolled in setting of illness. Patient denies hypoglycemic events and is able to verbalize appropriate hypoglycemia management plan. Patient reports adherence with medication. Control is suboptimal due to illness, dietary indiscretion. - Start humalog 5 units with breakfast, continue 10 units with supper - Continue Tresiba 50 units daily -  Continue Jardiance 25 mg daily, Trulicity 1.5 mg weekly  Written patient instructions provided.  Total time in face to face counseling 10 minutes.    Follow up in Pharmacist Clinic Visit in 2 weeks.  Allena Katzaroline E Faolan [place], Pharm.D., BCPS, CPP PGY2 Ambulatory Care Pharmacy Resident Phone: [phone]

## 2017-11-25 NOTE — Progress Notes (Signed)
Marikay Alar, MD Phone: [phone]  Danny Friends Gilani Sr. is a 67 y.o. male who presents today for same day visit.   Patient notes starting a month ago he has had increased chest congestion and cough.  He has been short of breath over that time as well.  He went to the emergency department though did not stay for evaluation.  He notes shortness of breath did worsen over the weekend where he felt like he was gagging though that has improved.  Notes his dyspnea is only on exertion.  He notes slight chest discomfort only when he coughs though otherwise no chest pain.  No leg swelling.  No orthopnea.  His albuterol inhaler is beneficial.  He has been using that twice a day.  Social History   Tobacco Use  Smoking Status Former Smoker  . Packs/day: 3.00  . Years: 0.00  . Pack years: 0.00  . Types: Cigarettes  . Last attempt to quit: 09/07/2009  . Years since quitting: 8.2  Smokeless Tobacco Never Used  Tobacco Comment   quit june 2011     ROS see history of present illness  Objective  Physical Exam Vitals:   11/26/17 1913  BP: (!) 147/92  Pulse: 99  Temp: (!) 97.5 F (36.4 C)  SpO2: 96%    BP Readings from Last 3 Encounters:  11/26/17 (!) 147/92  11/25/17 (!) 147/92  11/02/17 121/76   Wt Readings from Last 3 Encounters:  11/02/17 234 lb (106.1 kg)  10/14/17 237 lb (107.5 kg)  09/26/17 238 lb 4 oz (108.1 kg)   Ambulatory O2 sat 89%, ambulatory O2 sat after nebulizer treatment 90%  Physical Exam  Constitutional: No distress.  Cardiovascular: Normal rate, regular rhythm and normal heart sounds.  Pulmonary/Chest: Effort normal. No respiratory distress. He has no wheezes.  Coarse breath sounds throughout  Musculoskeletal: He exhibits no edema.  Neurological: He is alert.  Skin: Skin is warm and dry. He is not diaphoretic.   Patient was given an albuterol nebulizer treatment.  His breath sounds became completely normal after this.  His breathing improved and he did not  have dyspnea on exertion.  Assessment/Plan: Please see individual problem list.  COPD exacerbation (HCC) Patient with COPD exacerbation.  He responded to the albuterol nebulizer treatment.  We will obtain a chest x-ray.  We will treat with doxycycline and prednisone.  If not improving he will follow-up.  He is given return precautions.   Orders Placed This Encounter  Procedures  . DG Chest 2 View    Standing Status:   Future    Number of Occurrences:   1    Standing Expiration Date:   01/26/2019    Order Specific Question:   Reason for Exam (SYMPTOM  OR DIAGNOSIS REQUIRED)    Answer:   cough, chest congestion, dyspnea, COPD exacerbation    Order Specific Question:   Preferred imaging location?    Answer:   [facility] Specific Question:   Radiology Contrast Protocol - do NOT remove file path    Answer:   [email or web address]  . POCT Glucose (CBG)    Meds ordered this encounter  Medications  . albuterol (PROVENTIL HFA;VENTOLIN HFA) 108 (90 Base) MCG/ACT inhaler    Sig: Inhale 2 puffs into the lungs every 6 (six) hours as needed for wheezing or shortness of breath.    Dispense:  1 Inhaler    Refill:  0  . predniSONE (DELTASONE) 20 MG tablet  Sig: Take 2 tablets (40 mg total) by mouth daily with breakfast.    Dispense:  10 tablet    Refill:  0  . doxycycline (VIBRA-TABS) 100 MG tablet    Sig: Take 1 tablet (100 mg total) by mouth 2 (two) times daily.    Dispense:  14 tablet    Refill:  0  . albuterol (PROVENTIL) (2.5 MG/3ML) 0.083% nebulizer solution 2.5 mg     Marikay AlarEric Ahmeer Tuman, MD [facility] [place]

## 2017-11-26 ENCOUNTER — Other Ambulatory Visit: Payer: Self-pay | Admitting: Family Medicine

## 2017-11-26 ENCOUNTER — Encounter: Payer: Self-pay | Admitting: Family Medicine

## 2017-11-26 DIAGNOSIS — J441 Chronic obstructive pulmonary disease with (acute) exacerbation: Secondary | ICD-10-CM | POA: Insufficient documentation

## 2017-11-26 DIAGNOSIS — J189 Pneumonia, unspecified organism: Secondary | ICD-10-CM

## 2017-11-26 MED ORDER — INSULIN LISPRO 100 UNIT/ML (KWIKPEN): PEN_INJECTOR | SUBCUTANEOUS | 3 refills | Status: DC

## 2017-11-26 NOTE — Addendum Note (Signed)
Addended by: Devota PaceWELLES, CAROLINE E on: 11/26/2017 10:35 AM   Modules accepted: Orders

## 2017-11-26 NOTE — Assessment & Plan Note (Signed)
Patient with COPD exacerbation.  He responded to the albuterol nebulizer treatment.  We will obtain a chest x-ray.  We will treat with doxycycline and prednisone.  If not improving he will follow-up.  He is given return precautions.

## 2017-11-26 NOTE — Assessment & Plan Note (Signed)
#  Diabetes longstanding currently uncontrolled in setting of illness. Patient denies hypoglycemic events and is able to verbalize appropriate hypoglycemia management plan. Patient reports adherence with medication. Control is suboptimal due to illness, dietary indiscretion. - Start humalog 5 units with breakfast, continue 10 units with supper - Continue Tresiba 50 units daily - Continue Jardiance 25 mg daily, Trulicity 1.5 mg weekly

## 2017-11-28 NOTE — Progress Notes (Signed)
I have reviewed the above note and agree. I was available to the pharmacist for consultation.  Eric Sonnenberg, MD  

## 2017-12-09 ENCOUNTER — Other Ambulatory Visit: Payer: Self-pay | Admitting: Family Medicine

## 2017-12-09 MED ORDER — VENTOLIN HFA 108 (90 BASE) MCG/ACT IN AERS: 2.0000 | INHALATION_SPRAY | Freq: Four times a day (QID) | RESPIRATORY_TRACT | 0 refills | Status: DC | PRN

## 2017-12-19 ENCOUNTER — Other Ambulatory Visit: Payer: Self-pay

## 2017-12-19 ENCOUNTER — Ambulatory Visit (INDEPENDENT_AMBULATORY_CARE_PROVIDER_SITE_OTHER): Payer: Medicare HMO | Admitting: Family Medicine

## 2017-12-19 ENCOUNTER — Encounter: Payer: Self-pay | Admitting: Family Medicine

## 2017-12-19 VITALS — BP 118/88 | HR 90 | Temp 97.4°F | Ht 71.0 in | Wt 235.2 lb

## 2017-12-19 DIAGNOSIS — E118 Type 2 diabetes mellitus with unspecified complications: Secondary | ICD-10-CM

## 2017-12-19 DIAGNOSIS — R0989 Other specified symptoms and signs involving the circulatory and respiratory systems: Secondary | ICD-10-CM | POA: Diagnosis not present

## 2017-12-19 DIAGNOSIS — IMO0002 Reserved for concepts with insufficient information to code with codable children: Secondary | ICD-10-CM

## 2017-12-19 DIAGNOSIS — I10 Essential (primary) hypertension: Secondary | ICD-10-CM | POA: Diagnosis not present

## 2017-12-19 DIAGNOSIS — E1165 Type 2 diabetes mellitus with hyperglycemia: Secondary | ICD-10-CM

## 2017-12-19 DIAGNOSIS — E785 Hyperlipidemia, unspecified: Secondary | ICD-10-CM | POA: Diagnosis not present

## 2017-12-19 DIAGNOSIS — J432 Centrilobular emphysema: Secondary | ICD-10-CM

## 2017-12-19 DIAGNOSIS — E1142 Type 2 diabetes mellitus with diabetic polyneuropathy: Secondary | ICD-10-CM

## 2017-12-19 MED ORDER — DULAGLUTIDE 1.5 MG/0.5ML ~~LOC~~ SOAJ: 1.5000 mg | SUBCUTANEOUS | 3 refills | Status: DC

## 2017-12-19 MED ORDER — BLOOD GLUCOSE MONITOR KIT: PACK | 0 refills | Status: DC

## 2017-12-19 MED ORDER — LISINOPRIL 2.5 MG PO TABS: 2.5000 mg | ORAL_TABLET | Freq: Every day | ORAL | 1 refills | Status: DC

## 2017-12-19 MED ORDER — CLOPIDOGREL BISULFATE 75 MG PO TABS: 75.0000 mg | ORAL_TABLET | Freq: Every day | ORAL | 1 refills | Status: DC

## 2017-12-19 MED ORDER — TAMSULOSIN HCL 0.4 MG PO CAPS: 0.4000 mg | ORAL_CAPSULE | Freq: Every day | ORAL | 1 refills | Status: DC

## 2017-12-19 MED ORDER — INSULIN DEGLUDEC 100 UNIT/ML ~~LOC~~ SOPN: PEN_INJECTOR | SUBCUTANEOUS | 1 refills | Status: DC

## 2017-12-19 MED ORDER — POTASSIUM CHLORIDE ER 10 MEQ PO CPCR: 10.0000 meq | ORAL_CAPSULE | Freq: Every day | ORAL | 1 refills | Status: DC

## 2017-12-19 MED ORDER — PREGABALIN 25 MG PO CAPS: 25.0000 mg | ORAL_CAPSULE | Freq: Two times a day (BID) | ORAL | 2 refills | Status: DC

## 2017-12-19 MED ORDER — ATORVASTATIN CALCIUM 80 MG PO TABS: 80.0000 mg | ORAL_TABLET | Freq: Every day | ORAL | 1 refills | Status: DC

## 2017-12-19 MED ORDER — INSULIN LISPRO 100 UNIT/ML (KWIKPEN): PEN_INJECTOR | SUBCUTANEOUS | 3 refills | Status: DC

## 2017-12-19 MED ORDER — CARVEDILOL 12.5 MG PO TABS: 12.5000 mg | ORAL_TABLET | Freq: Two times a day (BID) | ORAL | 1 refills | Status: DC

## 2017-12-19 MED ORDER — EMPAGLIFLOZIN 25 MG PO TABS: 25.0000 mg | ORAL_TABLET | Freq: Every day | ORAL | 3 refills | Status: DC

## 2017-12-19 MED ORDER — FUROSEMIDE 40 MG PO TABS: 40.0000 mg | ORAL_TABLET | Freq: Every day | ORAL | 1 refills | Status: DC

## 2017-12-19 NOTE — Assessment & Plan Note (Signed)
Has decreased pedal pulses on exam.  He potentially could have some claudication symptoms though his symptoms may just be related to his neuropathy.  We will get ABIs completed.

## 2017-12-19 NOTE — Assessment & Plan Note (Signed)
He continues to have issues with this.  He was nonresponsive to gabapentin previously.  We will trial Lyrica.

## 2017-12-19 NOTE — Assessment & Plan Note (Signed)
Controlled.  Continue Symbicort.

## 2017-12-19 NOTE — Assessment & Plan Note (Signed)
Check LDL. 

## 2017-12-19 NOTE — Progress Notes (Signed)
Danny Rumps, MD Phone: [phone]  Danny Estimable Smith Sr. is a 67 y.o. male who presents today for f/u.  Diabetic neuropathy: Patient with leg pain from knees down.  Notes it is tingling and numb.  Some sharp pains at times from his knees down.  Hurts more when he walks.  He uses when he sits down.  Improves with laying under the covers in bed.  Has no night time pain or rest pain.  Was previously on gabapentin up to 500 mg 3 times daily with no benefit.  He did not have ABIs done previously.  COPD: Notes he is a lot better on the Symbicort.  He has recovered from his prior exacerbation.  No cough, wheezing, or shortness of breath.  DIABETES Disease Monitoring: Blood Sugar ranges-lost his meter Polyuria/phagia/dipsia- no      Optho- due Medications: Compliance- taking tresiba 50 u, humalog 5 u in am and 10 u pm, trulicity, jardiance Hypoglycemic symptoms- no   Social History   Tobacco Use  Smoking Status Former Smoker  . Packs/day: 3.00  . Years: 0.00  . Pack years: 0.00  . Types: Cigarettes  . Last attempt to quit: 09/07/2009  . Years since quitting: 8.2  Smokeless Tobacco Never Used  Tobacco Comment   quit june 2011     ROS see history of present illness  Objective  Physical Exam Vitals:   12/19/17 0802 12/19/17 0825  BP: 140/90 118/88  Pulse: 90   Temp: (!) 97.4 F (36.3 C)   SpO2: 93%     BP Readings from Last 3 Encounters:  12/19/17 118/88  11/26/17 (!) 147/92  11/25/17 (!) 147/92   Wt Readings from Last 3 Encounters:  12/19/17 235 lb 3.2 oz (106.7 kg)  11/02/17 234 lb (106.1 kg)  10/14/17 237 lb (107.5 kg)    Physical Exam  Constitutional: No distress.  Cardiovascular: Normal rate, regular rhythm and normal heart sounds.  Pulmonary/Chest: Effort normal and breath sounds normal.  Musculoskeletal: He exhibits no edema.  Neurological: He is alert.  Skin: Skin is warm and dry. He is not diaphoretic.   Diabetic Foot Exam - Simple   Simple Foot  Form Diabetic Foot exam was performed with the following findings:  Yes 12/19/2017  8:31 AM  Visual Inspection See comments:  Yes Sensation Testing See comments:  Yes Pulse Check See comments:  Yes Comments Thickened yellow toenails bilaterally, no other deformities, ulcerations, or skin breakdown, decreased monofilament and light touch sensation bilateral feet, decreased PT and DP pulses bilaterally   Decrease sensation from knees down on light touch   Assessment/Plan: Please see individual problem list.  COPD (chronic obstructive pulmonary disease) ([place]) Controlled.  Continue Symbicort.  Diabetes mellitus type 2, uncontrolled, with complications (HCC) Continue current regimen.  He will return for lab work next week.  We will send in a new meter for him.  Diabetic neuropathy ([place]) He continues to have issues with this.  He was nonresponsive to gabapentin previously.  We will trial Lyrica.  Decreased pedal pulses Has decreased pedal pulses on exam.  He potentially could have some claudication symptoms though his symptoms may just be related to his neuropathy.  We will get ABIs completed.  Hyperlipidemia Check LDL.   Health Maintenance: Discussed referral for colonoscopy though he deferred at this time.  Refer to ophthalmology for yearly eye exam.  Orders Placed This Encounter  Procedures  . Comp Met (CMET)    Standing Status:   Future  Standing Expiration Date:   12/20/2018  . LDL cholesterol, direct    Standing Status:   Future    Standing Expiration Date:   12/20/2018  . Hemoglobin A1c    Standing Status:   Future    Standing Expiration Date:   12/20/2018  . Ambulatory referral to Ophthalmology    Referral Priority:   Routine    Referral Type:   Consultation    Referral Reason:   Specialty Services Required    Requested Specialty:   Ophthalmology    Number of Visits Requested:   1    Meds ordered this encounter  Medications  . pregabalin (LYRICA) 25 MG capsule     Sig: Take 1 capsule (25 mg total) by mouth 2 (two) times daily.    Dispense:  60 capsule    Refill:  2  . atorvastatin (LIPITOR) 80 MG tablet    Sig: Take 1 tablet (80 mg total) by mouth daily.    Dispense:  90 tablet    Refill:  1  . carvedilol (COREG) 12.5 MG tablet    Sig: Take 1 tablet (12.5 mg total) by mouth 2 (two) times daily with a meal.    Dispense:  180 tablet    Refill:  1  . clopidogrel (PLAVIX) 75 MG tablet    Sig: Take 1 tablet (75 mg total) by mouth daily.    Dispense:  90 tablet    Refill:  1  . Dulaglutide (TRULICITY) 1.5 MV/7.8IO SOPN    Sig: Inject 1.5 mg into the skin once a week.    Dispense:  12 pen    Refill:  3    Discontinue Trulicity 9.62 mg weekly. May fill either 30 or 90 day supply of Trulicity 1.5 mg weekly according to pt preference.  . furosemide (LASIX) 40 MG tablet    Sig: Take 1 tablet (40 mg total) by mouth daily.    Dispense:  90 tablet    Refill:  1  . insulin lispro (HUMALOG KWIKPEN) 100 UNIT/ML KiwkPen    Sig: Inject 5 units under the skin with breakfast and 10 units with supper    Dispense:  3 mL    Refill:  3  . lisinopril (PRINIVIL,ZESTRIL) 2.5 MG tablet    Sig: Take 1 tablet (2.5 mg total) by mouth daily.    Dispense:  90 tablet    Refill:  1  . empagliflozin (JARDIANCE) 25 MG TABS tablet    Sig: Take 25 mg by mouth daily.    Dispense:  90 tablet    Refill:  3  . potassium chloride (MICRO-K) 10 MEQ CR capsule    Sig: Take 1 capsule (10 mEq total) by mouth daily. Take with lasix    Dispense:  90 capsule    Refill:  1  . tamsulosin (FLOMAX) 0.4 MG CAPS capsule    Sig: Take 1 capsule (0.4 mg total) by mouth daily.    Dispense:  90 capsule    Refill:  1  . insulin degludec (TRESIBA FLEXTOUCH) 100 UNIT/ML SOPN FlexTouch Pen    Sig: INJECT 50 UNITS DAILY AT 10 PM INTO THE SKIN    Dispense:  15 mL    Refill:  1  . blood glucose meter kit and supplies KIT    Sig: Dispense based on patient and insurance preference. Check CBGs 4  times daily. Dx code E11.9.    Dispense:  1 each    Refill:  0    Order  Specific Question:   Number of strips    Answer:   300    Order Specific Question:   Number of lancets    Answer:   [place], MD [place]

## 2017-12-19 NOTE — Patient Instructions (Addendum)
Nice to see you. We will try you on Lyrica for your leg pain.  We also have your blood flow checked in your legs. We will have you return next week for your A1c. You need to see an eye doctor for your yearly eye exam.

## 2017-12-19 NOTE — Assessment & Plan Note (Signed)
Continue current regimen.  He will return for lab work next week.  We will send in a new meter for him.

## 2017-12-26 ENCOUNTER — Telehealth: Payer: Self-pay | Admitting: Family Medicine

## 2017-12-26 MED ORDER — INSULIN ASPART 100 UNIT/ML FLEXPEN: PEN_INJECTOR | SUBCUTANEOUS | 2 refills | Status: DC

## 2017-12-26 NOTE — Addendum Note (Signed)
Addended by: [name] on: 12/26/2017 02:13 PM   Modules accepted: Orders

## 2017-12-26 NOTE — Telephone Encounter (Signed)
Sent to pharmacy 

## 2017-12-26 NOTE — Telephone Encounter (Signed)
Patient notified, please send in new rx

## 2017-12-26 NOTE — Telephone Encounter (Signed)
Please let the patient know that his insurance company will not be covering Humalog anymore.  Their preferred medication is NovoLog which works the same way as Humalog.  Once you speak with him I can send in NovoLog for him to start on when he runs out of Humalog.  The instructions will not change with this change in medication.

## 2018-01-01 ENCOUNTER — Ambulatory Visit (INDEPENDENT_AMBULATORY_CARE_PROVIDER_SITE_OTHER): Payer: Medicare HMO

## 2018-01-01 DIAGNOSIS — R0989 Other specified symptoms and signs involving the circulatory and respiratory systems: Secondary | ICD-10-CM | POA: Diagnosis not present

## 2018-01-06 ENCOUNTER — Encounter: Payer: Self-pay | Admitting: Pharmacist

## 2018-01-06 ENCOUNTER — Ambulatory Visit (INDEPENDENT_AMBULATORY_CARE_PROVIDER_SITE_OTHER): Payer: Medicare HMO | Admitting: Pharmacist

## 2018-01-06 VITALS — BP 153/97 | HR 84 | Ht 71.0 in | Wt 238.0 lb

## 2018-01-06 DIAGNOSIS — IMO0002 Reserved for concepts with insufficient information to code with codable children: Secondary | ICD-10-CM

## 2018-01-06 DIAGNOSIS — E1165 Type 2 diabetes mellitus with hyperglycemia: Secondary | ICD-10-CM | POA: Diagnosis not present

## 2018-01-06 DIAGNOSIS — E118 Type 2 diabetes mellitus with unspecified complications: Secondary | ICD-10-CM

## 2018-01-06 LAB — POCT GLYCOSYLATED HEMOGLOBIN (HGB A1C): Hemoglobin A1C: 9.5 % — AB (ref 4.0–5.6)

## 2018-01-06 MED ORDER — ACCU-CHEK SOFTCLIX LANCET DEV KIT
PACK | 0 refills | Status: DC
Start: 2018-01-06 — End: 2019-11-05

## 2018-01-06 MED ORDER — PREGABALIN 50 MG PO CAPS: 50.0000 mg | ORAL_CAPSULE | Freq: Two times a day (BID) | ORAL | 2 refills | Status: DC

## 2018-01-06 NOTE — Patient Instructions (Addendum)
Good to see you!   I  sent prescription for lancing device and increased your lyrica to 50 mg by mouth twice a day after talking with Dr. Birdie SonsSonnenberg.  I need you to test your blood sugars four times a day and bring me those numbers. Call me if you are unable to pick up your insulin or get your testing supplies.    Come back to see me in 1-2 weeks.

## 2018-01-06 NOTE — Progress Notes (Signed)
S:     Chief Complaint  Patient presents with  . Medication Management    Diabetes    Patient arrives in good spirits, ambulating without assistance.  Presents for diabetes evaluation, education, and management at the request of Dr. [name] (referred on 03/06/17). Last seen by primary care provider on 12/19/17.   Patient reports he has been doing "OK" however he lost his lancing device and has been unable to check CBGs. Additionally, reports he has been out of humalog x1 week and did not know there was a prescription for novolog waiting for him at the pharmacy. Patient c/o persistent back and leg pain minimally helped by lyrica 25 mg BID prescribed at last visit with Dr. [name], described as numbness, tingling in legs and feet. Denies any dizziness, fatigue. Patient expresses interest in Freestyle Libre CGM. Reports he has been managing his pills. Taking symbicort and albuterol PRN.  Has not taken any medication today.   Insurance coverage/medication affordability: [employer] [place] plan  Patient denies adherence with medications 2/2 running out of humalog Current diabetes medications include: Tresiba 50 untis daily, humalog 5 units with breakfast, 10 units with supper, Jardiance 25 mg daily, Trulicity 1.5 mg weekly.  Current hypertension medications include: furosemide 40 mg daily, isosorbide mononitrate 3 '0mg'$  daily, lisinopril 2.5 mg daily, carvedilol 12.5 mg BID  Patient denies hypoglycemic events.  Patient reported dietary habits: Eats 2 meals/day. Reports dietary excursion with ice cream cake last night Patient reported exercise habits: none at present, limited by neuropathic pain.    Patient reports nocturia. "A bunch but less, ~3 times a night" Patient denies pain/burning on urination.  Patient reports neuropathy. Patient denies visual changes. States vision is "so-so". Patient reports self foot exams. Denies issues.   O:  Physical Exam  Constitutional: He appears  well-developed and well-nourished.     Review of Systems  Eyes: Negative for blurred vision.  Respiratory: Negative for cough.   Cardiovascular: Negative for chest pain.  Gastrointestinal: Negative for nausea and vomiting.  Genitourinary: Positive for urgency (when he takes furosemide). Negative for dysuria and frequency.  Neurological: Negative for dizziness.     Lab Results  Component Value Date   HGBA1C 9.5 (A) 01/06/2018   Vitals:   01/06/18 0807 01/06/18 0835  BP: (!) 164/110 (!) 153/97  Pulse: 87 84    Lipid Panel     Component Value Date/Time   CHOL 92 03/05/2017 1520   TRIG 78.0 03/05/2017 1520   HDL 42.10 03/05/2017 1520   CHOLHDL 2 03/05/2017 1520   VLDL 15.6 03/05/2017 1520   LDLCALC 35 03/05/2017 1520    CrCl = 61 using adjusted body weight EGFR, non AA - 47 as of 11/02/17  A/P: #Diabetes longstanding currently uncontrolled as evidenced by A1C. Patient denies hypoglycemic events and is able to verbalize appropriate hypoglycemia management plan. Patient denies adherence with medication. Control is suboptimal due to medication nonadherence, dietary indiscretion, pain. - A1C today reveals slightly worsened to 9.5% - Unable to back changes to insulin regimen or prescribe CGM without objective CBG data - Counseled on sick day rules for Jardiance - Next A1C anticipated 3 months - Sent Rx for lancing device and explained novolog Rx is at his pharmacy, use at 1:1 dosing.  - Asked patient to test CBGs 4 times daily.   #ASCVD risk - secondary prevention as pt dx with CAD - Continued Aspirin 81 mg.  - Continued atorvastatin 80 mg.   #Hypertension longstanding currently uncontrolled without  any medication in his system. Patient with history of hypotension on more medications and at higher doses. - Continued current meds - Asked patient to take meds before each visit - Will follow up at next visit (1-2 weeks)  #Diabetic neuropathy - uncontrolled per patient  report of lyrica 25 mg BID. Discussed with Dr. [name].  - Increase lyrica to 50 mg BID. Hard copy Rx provided to patient.   Written patient instructions provided.  Total time in face to face counseling 30 minutes.    Follow up in Pharmacist Clinic Visit 1-2 weeks.   Carlean Jews, Pharm.D., BCPS, CPP PGY2 Ambulatory Care Pharmacy Resident Phone: [phone]

## 2018-01-06 NOTE — Assessment & Plan Note (Signed)
#  Diabetes longstanding currently uncontrolled as evidenced by A1C. Patient denies hypoglycemic events and is able to verbalize appropriate hypoglycemia management plan. Patient denies adherence with medication. Control is suboptimal due to medication nonadherence, dietary indiscretion, pain. - A1C today reveals slightly worsened to 9.5% - Unable to back changes to insulin regimen or prescribe CGM without objective CBG data - Counseled on sick day rules for Jardiance - Next A1C anticipated 3 months - Sent Rx for lancing device and explained novolog Rx is at his pharmacy, use at 1:1 dosing.  - Asked patient to test CBGs 4 times daily.   #Diabetic neuropathy - uncontrolled per patient report of lyrica 25 mg BID. Discussed with Dr. Birdie SonsSonnenberg.  - Increase lyrica to 50 mg BID. Hard copy Rx provided to patient.

## 2018-01-06 NOTE — Assessment & Plan Note (Signed)
#  ASCVD risk - secondary prevention as pt dx with CAD - Continued Aspirin 81 mg.  - Continued atorvastatin 80 mg.   #Hypertension longstanding currently uncontrolled without any medication in his system. Patient with history of hypotension on more medications and at higher doses. - Continued current meds - Asked patient to take meds before each visit - Will follow up at next visit (1-2 weeks)

## 2018-01-10 NOTE — Progress Notes (Signed)
I have reviewed the above note and agree. I was available to the pharmacist for consultation.  Micharl Helmes, MD  

## 2018-01-12 ENCOUNTER — Emergency Department
Admission: EM | Admit: 2018-01-12 | Discharge: 2018-01-12 | Disposition: A | Discharge status: Home / Self Care | Payer: Medicare HMO | Attending: Emergency Medicine | Admitting: Emergency Medicine

## 2018-01-12 ENCOUNTER — Encounter: Payer: Self-pay | Admitting: Emergency Medicine

## 2018-01-12 DIAGNOSIS — Z7982 Long term (current) use of aspirin: Secondary | ICD-10-CM | POA: Insufficient documentation

## 2018-01-12 DIAGNOSIS — Z79899 Other long term (current) drug therapy: Secondary | ICD-10-CM | POA: Insufficient documentation

## 2018-01-12 DIAGNOSIS — Z955 Presence of coronary angioplasty implant and graft: Secondary | ICD-10-CM | POA: Diagnosis not present

## 2018-01-12 DIAGNOSIS — M545 Low back pain, unspecified: Secondary | ICD-10-CM

## 2018-01-12 DIAGNOSIS — I5042 Chronic combined systolic (congestive) and diastolic (congestive) heart failure: Secondary | ICD-10-CM | POA: Insufficient documentation

## 2018-01-12 DIAGNOSIS — Z794 Long term (current) use of insulin: Secondary | ICD-10-CM | POA: Diagnosis not present

## 2018-01-12 DIAGNOSIS — E1122 Type 2 diabetes mellitus with diabetic chronic kidney disease: Secondary | ICD-10-CM | POA: Insufficient documentation

## 2018-01-12 DIAGNOSIS — J449 Chronic obstructive pulmonary disease, unspecified: Secondary | ICD-10-CM | POA: Insufficient documentation

## 2018-01-12 DIAGNOSIS — M62830 Muscle spasm of back: Secondary | ICD-10-CM | POA: Diagnosis not present

## 2018-01-12 DIAGNOSIS — I130 Hypertensive heart and chronic kidney disease with heart failure and stage 1 through stage 4 chronic kidney disease, or unspecified chronic kidney disease: Secondary | ICD-10-CM | POA: Diagnosis not present

## 2018-01-12 DIAGNOSIS — Z87891 Personal history of nicotine dependence: Secondary | ICD-10-CM | POA: Diagnosis not present

## 2018-01-12 DIAGNOSIS — Z7902 Long term (current) use of antithrombotics/antiplatelets: Secondary | ICD-10-CM | POA: Insufficient documentation

## 2018-01-12 DIAGNOSIS — I2510 Atherosclerotic heart disease of native coronary artery without angina pectoris: Secondary | ICD-10-CM | POA: Diagnosis not present

## 2018-01-12 DIAGNOSIS — N183 Chronic kidney disease, stage 3 (moderate): Secondary | ICD-10-CM | POA: Diagnosis not present

## 2018-01-12 MED ORDER — DEXAMETHASONE SODIUM PHOSPHATE 10 MG/ML IJ SOLN
10.0000 mg | Freq: Once | INTRAMUSCULAR | Status: AC
  Administered 2018-01-12: 10 mg via INTRAMUSCULAR
  Filled 2018-01-12: qty 1

## 2018-01-12 MED ORDER — PREDNISONE 50 MG PO TABS: 50.0000 mg | ORAL_TABLET | Freq: Every day | ORAL | 0 refills | Status: DC

## 2018-01-12 MED ORDER — ORPHENADRINE CITRATE 30 MG/ML IJ SOLN
60.0000 mg | Freq: Once | INTRAMUSCULAR | Status: AC
  Administered 2018-01-12: 60 mg via INTRAMUSCULAR
  Filled 2018-01-12: qty 2

## 2018-01-12 MED ORDER — HYDROCODONE-ACETAMINOPHEN 5-325 MG PO TABS: 1.0000 | ORAL_TABLET | ORAL | 0 refills | Status: DC | PRN

## 2018-01-12 MED ORDER — METHOCARBAMOL 500 MG PO TABS: 500.0000 mg | ORAL_TABLET | Freq: Four times a day (QID) | ORAL | 0 refills | Status: DC

## 2018-01-12 NOTE — ED Triage Notes (Signed)
Patient presents to the ED with chronic back pain exacerbation.  Patient denies any recent trauma.  Patient states, "It hurts like this every once in a while."  Patient reports feet are numb at baseline due to diabetes.  Patient ambulatory with some difficulty.

## 2018-01-12 NOTE — ED Notes (Signed)
Lower back pain, intermittent for last couple years. 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 +ambulatory

## 2018-01-12 NOTE — ED Provider Notes (Signed)
[facility] Emergency Department Provider Note  ____________________________________________  Time seen: Approximately 3:03 PM  I have reviewed the triage vital signs and the nursing notes.   HISTORY  Chief Complaint Back Pain    HPI Danny SERANO Sr. is a 67 y.o. male who presents the emergency department complaining of right lower back pain.  Patient reports that he has intermittent lower back pain that "flares up."  Patient denies any recent injury or trauma to his lower back.  Patient does have chronic neuropathy from diabetes but states that there is no changes in sensation in his lower extremity.  Patient denies any dysuria, polyuria, hematuria.  He denies any bowel bladder dysfunction, saddle anesthesia or paresthesias.  Patient reports that he has a tight/cramping/pulling sensation in his right lower back.  Patient reports that he can palpate his lower back and feel a "knot."  No other complaints.  Patient has not tried any medications at home for this complaint prior to arrival.    Past Medical History:  Diagnosis Date  . CAD (coronary artery disease)    a. 01/2010 PCI of LAD; b. 09/2014 PCI/DES of mLAD due to ISR. D1 80, D2 80(jailed), LCX 16m c. 07/2016 NSTEMI/Cath: LM nl, LAD 273mSR, 40d, D1 90ost, 80p, D2 90ost, RI min irregs, LCX min irregs, OM1 90 small, OM2/3 min irregs, RCA min irregs, RPLB1 90, EF 25-35%.  . Chronic combined systolic and diastolic CHF (congestive heart failure) (HCAlbert   a. 07/2016 Echo: EF 30%, severe septal/anterior HK, Gr1 DD.  . Marland KitchenKD (chronic kidney disease), stage III (HCCherry  . COPD (chronic obstructive pulmonary disease) (HCWall Lane  . DM2 (diabetes mellitus, type 2) (HCColoma  . Erectile dysfunction   . HLD (hyperlipidemia)   . HTN (hypertension)   . Ischemic cardiomyopathy    a. 07/2016 Echo: EF 30% w/ sev septal/ant HK. Gr1 DD.    Patient Active Problem List   Diagnosis Date Noted  . Decreased pedal pulses 07/02/2017  .  Orthostasis 05/07/2017  . Anxiety and depression 05/07/2017  . CKD (chronic kidney disease) stage 3, GFR 30-59 ml/min (HCC) 03/06/2017  . Diabetic neuropathy (HCTennyson08/08/2016  . Chronic combined systolic and diastolic CHF (congestive heart failure) (HCAnacortes07/31/2018  . Morbid obesity (HCHinton11/13/2017  . H/O medication noncompliance 06/18/2016  . COPD (chronic obstructive pulmonary disease) (HCPlains11/14/2016  . GERD (gastroesophageal reflux disease) 12/20/2014  . Diabetes mellitus type 2, uncontrolled, with complications (HCPukalani1077/[id number]. Hyperlipidemia 02/10/2010  . Essential hypertension 02/10/2010  . CAD (coronary artery disease) 02/10/2010    Past Surgical History:  Procedure Laterality Date  . CAD: stent to the LAD    . CARDIAC CATHETERIZATION  10/01/2014  . CARDIAC CATHETERIZATION N/A 07/23/2016   Procedure: Left Heart Cath and Coronary Angiography;  Surgeon: MuWellington HampshireMD;  [facility];  Service: Cardiovascular;  Laterality: N/A;  . CORONARY ANGIOPLASTY WITH STENT PLACEMENT  10/01/2014    Prior to Admission medications   Medication Sig Start Date End Date Taking? Authorizing Provider  aspirin 81 MG tablet Take 81 mg by mouth daily.      [provider]  atorvastatin (LIPITOR) 80 MG tablet Take 1 tablet (80 mg total) by mouth daily. 12/19/17   SoLeone HavenMD  blood glucose meter kit and supplies KIT Dispense based on patient and insurance preference. Check CBGs 4 times daily. Dx code E11.9. 12/19/17   SoLeone HavenMD  carvedilol (COREG) 12.5 MG tablet Take 1 tablet (12.5 mg total) by mouth 2 (two) times daily with a meal. 12/19/17   [name], MD  clonazePAM (KLONOPIN) 0.5 MG tablet Take 0.5 tablets (0.25 mg total) by mouth 2 (two) times daily as needed for anxiety. 07/02/17   [name], MD  clopidogrel (PLAVIX) 75 MG tablet Take 1 tablet (75 mg total) by mouth daily. 12/19/17   [name], MD  Dulaglutide  (TRULICITY) 1.5 XF/0.7KU SOPN Inject 1.5 mg into the skin once a week. 12/19/17   [name], MD  empagliflozin (JARDIANCE) 25 MG TABS tablet Take 25 mg by mouth daily. 12/19/17   [name], MD  furosemide (LASIX) 40 MG tablet Take 1 tablet (40 mg total) by mouth daily. 12/19/17   [name], MD  HYDROcodone-acetaminophen (NORCO/VICODIN) 5-325 MG tablet Take 1 tablet by mouth every 4 (four) hours as needed for moderate pain. 01/12/18   Cuthriell, Charline Bills, PA-C  insulin aspart (NOVOLOG FLEXPEN) 100 UNIT/ML FlexPen Inject 5 units under the skin with breakfast and 10 units with supper - do not start until you finish your supply of humalog 12/26/17   [name], MD  insulin degludec (TRESIBA FLEXTOUCH) 100 UNIT/ML SOPN FlexTouch Pen INJECT 50 UNITS DAILY AT 10 PM INTO THE SKIN 12/19/17   [name], MD  Insulin Pen Needle (PEN NEEDLES) 32G X 6 MM MISC 1 each by Does not apply route 2 (two) times daily. 07/22/17   [name], MD  isosorbide mononitrate (IMDUR) 30 MG 24 hr tablet Take 1 tablet (30 mg total) by mouth daily. 09/26/17   [name], MD  Lancets Misc. (ACCU-CHEK SOFTCLIX LANCET DEV) KIT Dispense one lancing device to test blood glucose four times daily. Dx code E11.9 01/06/18   [name], MD  lisinopril (PRINIVIL,ZESTRIL) 2.5 MG tablet Take 1 tablet (2.5 mg total) by mouth daily. 12/19/17   [name], MD  methocarbamol (ROBAXIN) 500 MG tablet Take 1 tablet (500 mg total) by mouth 4 (four) times daily. 01/12/18   Cuthriell, Charline Bills, PA-C  nitroGLYCERIN (NITROSTAT) 0.4 MG SL tablet Place 0.4 mg under the tongue every 5 (five) minutes as needed for chest pain.     [provider]  potassium chloride (MICRO-K) 10 MEQ CR capsule Take 1 capsule (10 mEq total) by mouth daily. Take with lasix 12/19/17   [name], MD  predniSONE (DELTASONE) 50 MG tablet Take 1 tablet (50 mg total) by mouth daily with breakfast. 01/12/18    Cuthriell, Charline Bills, PA-C  pregabalin (LYRICA) 50 MG capsule Take 1 capsule (50 mg total) by mouth 2 (two) times daily. 01/06/18   [name], MD  SYMBICORT 160-4.5 MCG/ACT inhaler INHALE 2 PUFFS BY MOUTH TWICE A DAY 10/09/17   [name], MD  tamsulosin (FLOMAX) 0.4 MG CAPS capsule Take 1 capsule (0.4 mg total) by mouth daily. 12/19/17   [name], MD  VENTOLIN HFA 108 (90 Base) MCG/ACT inhaler Inhale 2 puffs into the lungs every 6 (six) hours as needed for wheezing or shortness of breath. 12/09/17   [name], MD    Allergies Patient has no known allergies.  Family History  Problem Relation Age of Onset  . Heart attack Father        complications  . Cancer Brother     Social History Social History   Tobacco Use  . Smoking status: Former  Smoker    Packs/day: 3.00    Years: 0.00    Pack years: 0.00    Types: Cigarettes    Last attempt to quit: 09/07/2009    Years since quitting: 8.3  . Smokeless tobacco: Never Used  . Tobacco comment: quit june 2011  Substance Use Topics  . Alcohol use: Yes    Comment: every other weekend  . Drug use: No     Review of Systems  Constitutional: No fever/chills Eyes: No visual changes. No discharge ENT: No upper respiratory complaints. Cardiovascular: no chest pain. Respiratory: no cough. No SOB. Gastrointestinal: No abdominal pain.  No nausea, no vomiting.  No diarrhea.  No constipation. Genitourinary: Negative for dysuria. No hematuria Musculoskeletal: Positive for right lower back pain Skin: Negative for rash, abrasions, lacerations, ecchymosis. Neurological: Negative for headaches, focal weakness or numbness. 10-point ROS otherwise negative.  ____________________________________________   PHYSICAL EXAM:  VITAL SIGNS: ED Triage Vitals  Enc Vitals Group     BP 01/12/18 1436 113/77     Pulse Rate 01/12/18 1436 98     Resp 01/12/18 1436 18     Temp 01/12/18 1436 98.5 F (36.9 C)     Temp Source  01/12/18 1436 Oral     SpO2 01/12/18 1436 94 %     Weight 01/12/18 1438 238 lb (108 kg)     Height 01/12/18 1438 _0  (1.803 m)     Head Circumference --      Peak Flow --      Pain Score 01/12/18 1438 10     Pain Loc --      Pain Edu? --      Excl. in [place]? --      Constitutional: Alert and oriented. Well appearing and in no acute distress. Eyes: Conjunctivae are normal. PERRL. EOMI. Head: Atraumatic. Neck: No stridor.    Cardiovascular: Normal rate, regular rhythm. Normal S1 and S2.  Good peripheral circulation. Respiratory: Normal respiratory effort without tachypnea or retractions. Lungs CTAB. Good air entry to the bases with no decreased or absent breath sounds. Musculoskeletal: Full range of motion to all extremities. No gross deformities appreciated.  Visualization of the lumbar spine reveals no deformity.  Patient is nontender to palpation midline.  No palpable abnormality or step-off.  Patient with significant muscle spasm appreciated in the right lower lumbar paraspinal muscle region.  This is very tender to palpation.  No tenderness to palpation of the paraspinal muscle groups.  No tenderness to palpation over bilateral sciatic notches.  Negative straight leg raise bilaterally.  Dorsalis pedis pulse intact, slightly diminished bilaterally.(This is baseline for the patient, he sees vascular surgery for same.)  Sensation intact and equal bilateral lower extremities. Neurologic:  Normal speech and language. No gross focal neurologic deficits are appreciated.  Skin:  Skin is warm, dry and intact. No rash noted. Psychiatric: Mood and affect are normal. Speech and behavior are normal. Patient exhibits appropriate insight and judgement.   ____________________________________________   LABS (all labs ordered are listed, but only abnormal results are displayed)  Labs Reviewed - No data to  display ____________________________________________  EKG   ____________________________________________  RADIOLOGY   No results found.  ____________________________________________    PROCEDURES  Procedure(s) performed:    Procedures    Medications  dexamethasone (DECADRON) injection 10 mg (has no administration in time range)  orphenadrine (NORFLEX) injection 60 mg (has no administration in time range)     ____________________________________________   INITIAL IMPRESSION / ASSESSMENT AND PLAN /  ED COURSE  Pertinent labs & imaging results that were available during my care of the patient were reviewed by me and considered in my medical decision making (see chart for details).  Review of the [place] CSRS was performed in accordance of the [place] prior to dispensing any controlled drugs.      Patient's diagnosis is consistent with right-sided paraspinal muscle spasm.  Patient presents to the emergency department with increasing lower back pain.  No concerning symptoms warranting imaging at this time.  Differential included UTI, chronic lumbago, muscle spasm, neuropathy, sciatica.  Exam is consistent with muscle spasms to the right paraspinal muscle region.  She is given injection of steroid and muscle relaxer here in the emergency department.  Patient will be discharged home with prescriptions for her course of steroid, Robaxin, #10 of Norco.  Patient is cautioned on using both muscle relaxer and Norco at the same time as it has active effects of drowsiness, sluggishness, lightheadedness.  Patient was queried in the [place] controlled substance database with out a significant controlled substance history.. Patient is to follow up with primary care as needed or otherwise directed. Patient is given ED precautions to return to the ED for any worsening or new symptoms.     ____________________________________________  FINAL CLINICAL IMPRESSION(S) / ED DIAGNOSES  Final  diagnoses:  Acute right-sided low back pain without sciatica  Lumbar paraspinal muscle spasm      NEW MEDICATIONS STARTED DURING THIS VISIT:  ED Discharge Orders        Ordered    predniSONE (DELTASONE) 50 MG tablet  Daily with breakfast     01/12/18 1522    methocarbamol (ROBAXIN) 500 MG tablet  4 times daily     01/12/18 1522    HYDROcodone-acetaminophen (NORCO/VICODIN) 5-325 MG tablet  Every 4 hours PRN     01/12/18 1522          This chart was dictated using voice recognition software/Dragon. Despite best efforts to proofread, errors can occur which can change the meaning. Any change was purely unintentional.    Darletta Moll, PA-C 01/12/18 1523    Carrie Mew, MD 01/12/18 [phone]

## 2018-01-13 ENCOUNTER — Ambulatory Visit: Payer: Self-pay

## 2018-01-13 ENCOUNTER — Other Ambulatory Visit: Payer: Self-pay | Admitting: Family Medicine

## 2018-01-13 DIAGNOSIS — I739 Peripheral vascular disease, unspecified: Secondary | ICD-10-CM

## 2018-01-13 NOTE — Telephone Encounter (Signed)
fyi

## 2018-01-13 NOTE — Telephone Encounter (Signed)
The steroids could run his blood sugar up. If he sugar is going that high he should not continue the prednisone.  Please call him back this afternoon and see what his sugar is at this time. Please see if he has any nausea, vomiting, diarrhea, confusion, or other symptoms. Please also see if he lost all of the trulicity. He should have had a 3 month supply. Thanks.

## 2018-01-13 NOTE — Telephone Encounter (Signed)
Pt. Reports he went to the ED yesterday for back pain. "They gave me a steroid shot and told me it woulD run my sugar up.After breakfast this morning it was 480." States he "feels a little shaky." No other symptoms. States he has lost his Trulicity, so he has not had that today. Did take his  [place]. Has not taken Novolog - he has to pick it up at the drug store.States he does not want to take the Prednisone pills "if it's going to run my sugar up." FC will talk to provider.  Answer Assessment - Initial Assessment Questions 1. BLOOD GLUCOSE: "What is your blood glucose level?"      480  After breakfast 2. ONSET: "When did you check the blood glucose?"     After breakfast 3. USUAL RANGE: "What is your glucose level usually?" (e.g., usual fasting morning value, usual evening value)     200 4. KETONES: "Do you check for ketones (urine or blood test strips)?" If yes, ask: "What does the test show now?"      No 5. TYPE 1 or 2:  "Do you know what type of diabetes you have?"  (e.g., Type 1, Type 2, Gestational; doesn't know)      Unsure 6. INSULIN: "Do you take insulin?" If yes, ask: "Have you missed any shots recently?"     Yes - lost his Trulicity 7. DIABETES PILLS: "Do you take any pills for your diabetes?" If yes, ask: "Have you missed taking any pills recently?"     No 8. OTHER SYMPTOMS: "Do you have any symptoms?" (e.g., fever, frequent urination, difficulty breathing, dizziness, weakness, vomiting)     Feels a little shaky 9. PREGNANCY: "Is there any chance you are pregnant?" "When was your last menstrual period?"     N/A  Protocols used: DIABETES - HIGH BLOOD SUGAR-A-AH

## 2018-01-13 NOTE — Progress Notes (Signed)
v

## 2018-01-13 NOTE — Telephone Encounter (Signed)
Patient notified and denies any symptoms he states his blood sugar is 185 and will monitor. He states he just picked up the trulicity

## 2018-01-20 ENCOUNTER — Encounter: Payer: Self-pay | Admitting: Family Medicine

## 2018-01-20 ENCOUNTER — Ambulatory Visit (INDEPENDENT_AMBULATORY_CARE_PROVIDER_SITE_OTHER): Payer: Medicare HMO | Admitting: Family Medicine

## 2018-01-20 ENCOUNTER — Ambulatory Visit (INDEPENDENT_AMBULATORY_CARE_PROVIDER_SITE_OTHER): Payer: Medicare HMO | Admitting: Pharmacist

## 2018-01-20 VITALS — BP 123/79 | HR 101 | Temp 97.7°F | Wt 239.0 lb

## 2018-01-20 DIAGNOSIS — IMO0002 Reserved for concepts with insufficient information to code with codable children: Secondary | ICD-10-CM

## 2018-01-20 DIAGNOSIS — J432 Centrilobular emphysema: Secondary | ICD-10-CM

## 2018-01-20 DIAGNOSIS — I25118 Atherosclerotic heart disease of native coronary artery with other forms of angina pectoris: Secondary | ICD-10-CM

## 2018-01-20 DIAGNOSIS — R079 Chest pain, unspecified: Secondary | ICD-10-CM

## 2018-01-20 DIAGNOSIS — E118 Type 2 diabetes mellitus with unspecified complications: Secondary | ICD-10-CM

## 2018-01-20 DIAGNOSIS — E1165 Type 2 diabetes mellitus with hyperglycemia: Secondary | ICD-10-CM

## 2018-01-20 LAB — COMPREHENSIVE METABOLIC PANEL
ALT: 26 U/L (ref 0–53)
AST: 16 U/L (ref 0–37)
Albumin: 3.9 g/dL (ref 3.5–5.2)
Alkaline Phosphatase: 84 U/L (ref 39–117)
BUN: 48 mg/dL — ABNORMAL HIGH (ref 6–23)
CO2: 25 mEq/L (ref 19–32)
Calcium: 9.1 mg/dL (ref 8.4–10.5)
Chloride: 102 mEq/L (ref 96–112)
Creatinine, Ser: 1.51 mg/dL — ABNORMAL HIGH (ref 0.40–1.50)
GFR: 49.24 mL/min — ABNORMAL LOW (ref >60.00)
Glucose, Bld: 496 mg/dL — ABNORMAL HIGH (ref 70–99)
Potassium: 4.4 mEq/L (ref 3.5–5.1)
Sodium: 135 mEq/L (ref 135–145)
Total Bilirubin: 0.4 mg/dL (ref 0.2–1.2)
Total Protein: 6.5 g/dL (ref 6.0–8.3)

## 2018-01-20 LAB — CBC
HCT: 49.1 % (ref 39.0–52.0)
Hemoglobin: 16.2 g/dL (ref 13.0–17.0)
MCHC: 33.1 g/dL (ref 30.0–36.0)
MCV: 94.5 fl (ref 78.0–100.0)
Platelets: 201 10*3/uL (ref 150.0–400.0)
RBC: 5.2 Mil/uL (ref 4.22–5.81)
RDW: 14.1 % (ref 11.5–15.5)
WBC: 8.9 10*3/uL (ref 4.0–10.5)

## 2018-01-20 LAB — GLUCOSE, POCT (MANUAL RESULT ENTRY)
POC Glucose: 491 mg/dl — AB (ref 70–99)
POC Glucose: 534 mg/dl — AB (ref 70–99)

## 2018-01-20 MED ORDER — VENTOLIN HFA 108 (90 BASE) MCG/ACT IN AERS
2.0000 | INHALATION_SPRAY | Freq: Four times a day (QID) | RESPIRATORY_TRACT | 0 refills | Status: DC | PRN
Start: 2018-01-20 — End: 2018-11-21

## 2018-01-20 MED ORDER — BUDESONIDE-FORMOTEROL FUMARATE 160-4.5 MCG/ACT IN AERO: 2.0000 | INHALATION_SPRAY | Freq: Two times a day (BID) | RESPIRATORY_TRACT | 4 refills | Status: DC

## 2018-01-20 MED ORDER — INSULIN ASPART 100 UNIT/ML FLEXPEN: PEN_INJECTOR | SUBCUTANEOUS | 2 refills | Status: DC

## 2018-01-20 NOTE — Patient Instructions (Addendum)
Increase your novolog to 15 units up to three times daily with meals.  Continue Tresiba 50 units daily. Continue Trulicity 1.5 mg weekly.

## 2018-01-20 NOTE — Assessment & Plan Note (Signed)
Glucose elevated significantly today in setting of recent steroid injection and likely carbohydrate intake. His glucose trended down during the visit and he felt back to his baseline with water intake. I did discuss evaluation in the ED with the patient though he declined. Insulin regimen per pharmacists note. We will check a CMET to ensure the patient is not acidotic and check a CBC to ensure he has no signs of infection. I advised that if he were to start to feel poorly or developed worsening symptoms he would need to go to the ED for evaluation.

## 2018-01-20 NOTE — Progress Notes (Signed)
Tommi Rumps, MD Phone: [phone]  Danny Estimable Costanzo Sr. is a 67 y.o. male who presents today for same day visit.   CC: elevated glucose  Patient seen today at the request of clinical pharmacist who is seeing the patient for Diabetes management. She noted the patients glucose was in the 500s during the visit and that he had increased polyuria and polydipsia and that he did not feel well. The patient endorses these symptoms as well. He notes he received a steroid injection last week in the ED for low back pain and that he ate grits this morning. He denies fevers and chills. He notes no recent infections. He did take his morning insulin this morning.   The patient notes his breathing has been a little worse recently with the heat. He has not been using his inhalers. He notes he wheezes some. He notes no cough.  He notes one episode of central chest pain that occurred 2 days ago and lasted for 30 minutes. This occurred while he was laying down in bed and in the setting of him not taking his medications. He noted no dyspnea, diaphoresis, or radiation of the pain. It resolved within 30 minutes of taking his medication. He notes this has occurred in the past when he does not take his medication.   Social History   Tobacco Use  Smoking Status Former Smoker  . Packs/day: 3.00  . Years: 0.00  . Pack years: 0.00  . Types: Cigarettes  . Last attempt to quit: 09/07/2009  . Years since quitting: 8.3  Smokeless Tobacco Never Used  Tobacco Comment   quit june 2011     ROS see history of present illness  Objective  Physical Exam Vitals:   01/20/18 1147  BP: 123/79  Pulse: (!) 101  Temp: 97.7 F (36.5 C)  SpO2: 95%    BP Readings from Last 3 Encounters:  01/20/18 123/79  01/12/18 (!) 125/94  01/06/18 (!) 153/97   Wt Readings from Last 3 Encounters:  01/20/18 239 lb (108.4 kg)  01/12/18 238 lb (108 kg)  01/06/18 238 lb (108 kg)    Physical Exam  Constitutional: He appears  well-developed. No distress.  HENT:  Mildly dry oropharynx  Cardiovascular: Normal rate, regular rhythm and normal heart sounds.  Pulmonary/Chest: Effort normal and breath sounds normal.  Musculoskeletal: He exhibits no edema.  Skin: He is not diaphoretic.   EKG: NSR, one PAC noted, rate 99, nonspecific t wave abnormality unchanged from prior  Assessment/Plan: Please see individual problem list.  Diabetes mellitus type 2, uncontrolled, with complications (HCC) Glucose elevated significantly today in setting of recent steroid injection and likely carbohydrate intake. His glucose trended down during the visit and he felt back to his baseline with water intake. I did discuss evaluation in the ED with the patient though he declined. Insulin regimen per pharmacists note. We will check a CMET to ensure the patient is not acidotic and check a CBC to ensure he has no signs of infection. I advised that if he were to start to feel poorly or developed worsening symptoms he would need to go to the ED for evaluation.  CAD (coronary artery disease) Patient with episode of chest pain in setting of missing his medications. I suspect this is due to the missed imdur dose. His EKG is reassuring. We will get him set up with cardiology for follow-up. He is encouraged not to miss doses of his medication. He is given return precautions.   COPD (  chronic obstructive pulmonary disease) (HCC) Intermittent issues with dyspnea on exertion. Suspect this is related to his COPD. We will refill his inhalers. We will have him follow-up with cardiology to determine if there is a cardiac component.    Orders Placed This Encounter  Procedures  . CBC  . Comp Met (CMET)  . EKG 12-Lead    Meds ordered this encounter  Medications  . VENTOLIN HFA 108 (90 Base) MCG/ACT inhaler    Sig: Inhale 2 puffs into the lungs every 6 (six) hours as needed for wheezing or shortness of breath.    Dispense:  1 Inhaler    Refill:  0  .  budesonide-formoterol (SYMBICORT) 160-4.5 MCG/ACT inhaler    Sig: Inhale 2 puffs into the lungs 2 (two) times daily.    Dispense:  10.2 g    Refill:  [place], MD [place]

## 2018-01-20 NOTE — Patient Instructions (Signed)
Nice to see you. We will check some lab work today and contact you with the results. If your sugar does not come down please go to the emergency room.  If you develop infusion, drowsiness, or any new or changing symptoms please be evaluated immediately in the emergency room. If you develop chest pain, shortness of breath, or any new or changing symptoms please seek medical attention immediately.

## 2018-01-20 NOTE — Assessment & Plan Note (Signed)
Patient with episode of chest pain in setting of missing his medications. I suspect this is due to the missed imdur dose. His EKG is reassuring. We will get him set up with cardiology for follow-up. He is encouraged not to miss doses of his medication. He is given return precautions.

## 2018-01-20 NOTE — Progress Notes (Signed)
    S:     Chief Complaint  Patient presents with  . Medication Management    diabetes   Patient arrives in fair spirits, ambulating without assistance, stating "I don't feel good".  Presents for diabetes evaluation, education, and management at the request of Dr. Birdie SonsSonnenberg (referred on 03/06/17). Last seen by primary care provider on 12/19/17. Of note, patient went to ED on 6/9 for back pain and was given a decadron injection, PO prednisone, an orphenadrine injection, PO robaxin and hydrocodone-APAP. Patient reports CBGs have been elevated since this visit. He reports he stopped steroids after 2-3 days of taking them. Took CBGs x2 days and then stopped because "I couldn't do nothing". Reports compliance with insulins and Trulicity however knows his CBGs are out of control. Endorses sx of polydipsia and polyuria, fatigue, CP x1 episode, and SOB (states he needs refill on inhalers). Checked CBG in clinic today = 534, checked ~30 mins later and had come down to 491. Patient reports he had breakfast this morning (eggsx2 and grits) and took novolog 5 units daily.   O:   Lab Results  Component Value Date   HGBA1C 9.5 (A) 01/06/2018    Lipid Panel     Component Value Date/Time   CHOL 92 03/05/2017 1520   TRIG 78.0 03/05/2017 1520   HDL 42.10 03/05/2017 1520   CHOLHDL 2 03/05/2017 1520   VLDL 15.6 03/05/2017 1520   LDLCALC 35 03/05/2017 1520    Home fasting CBG: 300s-400s 2 hour post-prandial/random CBG: 185   A/P: #Diabetes longstanding, currently uncontrolled in setting of pain and recent steroid use. Concern for DKA with sx and greatly elevated CBGs. Patient refuses to go to ED. Please see Dr. Purvis SheffieldSonnenberg's note for full assessment and plan.  - Increase Novolog to 15 units 2-3 times daily with meals - Continue Tresiba 50 units daily - CBGs today x2, BMET order placed by Dr. Birdie SonsSonnenberg  #Chest pain - Pt evaluated by Dr. Birdie SonsSonnenberg, please see accompanying note. EKG done today.    Written patient instructions provided.  Total time in face to face counseling 30 minutes.    Follow up in Primary Care clinic as directed, f/u with pharmacy clinic in 1-2 weeks.  Allena Katzaroline E Welles, Pharm.D., BCPS, CPP PGY2 Ambulatory Care Pharmacy Resident Phone: [phone]

## 2018-01-20 NOTE — Assessment & Plan Note (Signed)
Intermittent issues with dyspnea on exertion. Suspect this is related to his COPD. We will refill his inhalers. We will have him follow-up with cardiology to determine if there is a cardiac component.

## 2018-01-20 NOTE — Assessment & Plan Note (Signed)
#  Diabetes longstanding, currently uncontrolled in setting of pain and recent steroid use. Concern for DKA with sx and greatly elevated CBGs. Patient refuses to go to ED. Please see Dr. Purvis SheffieldSonnenberg's note for full assessment and plan.  - Increase Novolog to 15 units 2-3 times daily with meals - Continue Tresiba 50 units daily - CBGs today x2, BMET

## 2018-01-21 ENCOUNTER — Telehealth: Payer: Self-pay | Admitting: Family Medicine

## 2018-01-21 NOTE — Telephone Encounter (Signed)
See result note.  

## 2018-01-21 NOTE — Telephone Encounter (Signed)
Please let the patient know that his glucose was similar to in the office at 496. His kidney function is stable, though he may be dehydrated. Please see what kind of blood glucose levels he has gotten at home since leaving the office. Please also see how he is feeling. Thanks.

## 2018-01-22 ENCOUNTER — Telehealth: Payer: Self-pay | Admitting: Pharmacist

## 2018-01-22 NOTE — Progress Notes (Signed)
I have reviewed the above note and agree. Please see my note for additional documentation.  Marikay AlarEric Lebron Nauert, MD

## 2018-01-22 NOTE — Telephone Encounter (Signed)
Noted. Glad to hear his CBGs are better with his altered regimen. Thank you for letting me know.

## 2018-01-22 NOTE — Telephone Encounter (Signed)
Patient calls back and reports he is doing much better today. States yesterday his CBG remained high at 307 fasting however this morning is is down to 191 fasting. Reports he is taking Tresiba 50 units daily and novolog 5 units with breakfast and 15 units with supper.  Advised patient to continue this. Will route to Dr. Birdie SonsSonnenberg as Lorain ChildesFYI.   Allena Katzaroline E Avree Szczygiel, Pharm.D., BCPS PGY2 Ambulatory Care Pharmacy Resident Phone: [phone]

## 2018-01-24 ENCOUNTER — Other Ambulatory Visit: Payer: Self-pay | Admitting: Family Medicine

## 2018-02-04 ENCOUNTER — Telehealth: Payer: Self-pay | Admitting: Pharmacist

## 2018-02-04 NOTE — Telephone Encounter (Signed)
Patient calls to request help refilling medications. Reports he threw away "the green insulin" accidentally and was unable to effectively order a refill at the pharmacy because he didn't know the name of the insulin.  Reports CBGs are down to 138-158 and he is feeling better.   [facility]ed [facility] patient [facility]([place] [facility] closed [facility] [place]) and placed refill request. Order went through insurance without issue.   Allena Katzaroline E Shaleigh Laubscher, Pharm.D., BCPS PGY2 Ambulatory Care Pharmacy Resident Phone: [phone]

## 2018-02-11 ENCOUNTER — Encounter (INDEPENDENT_AMBULATORY_CARE_PROVIDER_SITE_OTHER): Payer: Self-pay

## 2018-02-11 ENCOUNTER — Ambulatory Visit (INDEPENDENT_AMBULATORY_CARE_PROVIDER_SITE_OTHER): Payer: Medicare HMO | Admitting: Vascular Surgery

## 2018-02-11 ENCOUNTER — Encounter (INDEPENDENT_AMBULATORY_CARE_PROVIDER_SITE_OTHER): Payer: Self-pay | Admitting: Vascular Surgery

## 2018-02-11 ENCOUNTER — Other Ambulatory Visit (INDEPENDENT_AMBULATORY_CARE_PROVIDER_SITE_OTHER): Payer: Self-pay | Admitting: Vascular Surgery

## 2018-02-11 VITALS — BP 173/97 | HR 82 | Resp 17 | Ht 71.0 in | Wt 241.0 lb

## 2018-02-11 DIAGNOSIS — E1165 Type 2 diabetes mellitus with hyperglycemia: Secondary | ICD-10-CM

## 2018-02-11 DIAGNOSIS — IMO0002 Reserved for concepts with insufficient information to code with codable children: Secondary | ICD-10-CM

## 2018-02-11 DIAGNOSIS — I70213 Atherosclerosis of native arteries of extremities with intermittent claudication, bilateral legs: Secondary | ICD-10-CM | POA: Diagnosis not present

## 2018-02-11 DIAGNOSIS — I25118 Atherosclerotic heart disease of native coronary artery with other forms of angina pectoris: Secondary | ICD-10-CM

## 2018-02-11 DIAGNOSIS — I10 Essential (primary) hypertension: Secondary | ICD-10-CM | POA: Diagnosis not present

## 2018-02-11 DIAGNOSIS — E118 Type 2 diabetes mellitus with unspecified complications: Secondary | ICD-10-CM | POA: Diagnosis not present

## 2018-02-11 DIAGNOSIS — I70219 Atherosclerosis of native arteries of extremities with intermittent claudication, unspecified extremity: Secondary | ICD-10-CM | POA: Insufficient documentation

## 2018-02-11 DIAGNOSIS — E7800 Pure hypercholesterolemia, unspecified: Secondary | ICD-10-CM

## 2018-02-11 NOTE — Assessment & Plan Note (Signed)
Previous coronary interventions

## 2018-02-11 NOTE — Assessment & Plan Note (Signed)
The patient has previously had ABIs that showed pressures in the mildly reduced range but there was significant dampening of the tibial and digital waveforms.  He likely has significant tibial disease from his diabetes.  He is highly symptomatic.  I have discussed that he does not have a limb threatening situation.  I have discussed that a significant portion of his symptoms may be related to neuropathy which may or may not improve with revascularization.  The patient voices his understanding but says he is miserable and desires to have anything he can performed to try to improve his legs.  I have offered him angiograms of both lower extremities for further evaluation and potential treatment.  He is already on maximal medical therapy with aspirin, Plavix, and a statin agent and a high dose.  He would like to proceed with angiography and this will be scheduled at his convenience in the near future.  He understands that this will require 2 separate procedures, one for each leg.

## 2018-02-11 NOTE — Assessment & Plan Note (Signed)
lipid control important in reducing the progression of atherosclerotic disease. Continue statin therapy  

## 2018-02-11 NOTE — Patient Instructions (Signed)

## 2018-02-11 NOTE — Progress Notes (Signed)
Patient ID: Danny Organ Sr., male   DOB: 12-13-1950, 67 y.o.   MRN: [id number]  Chief Complaint  Patient presents with  . New Patient (Initial Visit)    Peripheral Artery Disease    HPI Danny TRAYNHAM Sr. is a 67 y.o. male.  I am asked to see the patient by Dr. [name] for evaluation of PAD.  The patient reports severe lower extremity pain.  This happens both at rest and with activity.  He says he wants to walk but he cannot walk secondary to pain in his calves and lower legs.  He does not have ulceration or infection.  He has been treated with neuropathy medications without improvement.  Both lower extremities are affected about the same.  There has been no clear inciting event or causative factor that started the symptoms.  This started several months ago and has been gradually worsening. The patient has previously had ABIs that showed pressures in the mildly reduced range but there was significant dampening of the tibial and digital waveforms.  He likely has significant tibial disease from his diabetes.   Past Medical History:  Diagnosis Date  . CAD (coronary artery disease)    a. 01/2010 PCI of LAD; b. 09/2014 PCI/DES of mLAD due to ISR. D1 80, D2 80(jailed), LCX 63m c. 07/2016 NSTEMI/Cath: LM nl, LAD 28mSR, 40d, D1 90ost, 80p, D2 90ost, RI min irregs, LCX min irregs, OM1 90 small, OM2/3 min irregs, RCA min irregs, RPLB1 90, EF 25-35%.  . Chronic combined systolic and diastolic CHF (congestive heart failure) ([facility]   a. 07/2016 Echo: EF 30%, severe septal/anterior HK, Gr1 DD.  . Marland KitchenKD (chronic kidney disease), stage III ([facility]  . COPD (chronic obstructive pulmonary disease) ([facility]  . DM2 (diabetes mellitus, type 2) ([facility]  . Erectile dysfunction   . HLD (hyperlipidemia)   . HTN (hypertension)   . Ischemic cardiomyopathy    a. 07/2016 Echo: EF 30% w/ sev septal/ant HK. Gr1 DD.    Past Surgical History:  Procedure Laterality Date  . CAD: stent to the LAD    . CARDIAC  CATHETERIZATION  10/01/2014  . CARDIAC CATHETERIZATION N/A 07/23/2016   Procedure: Left Heart Cath and Coronary Angiography;  Surgeon: MuWellington HampshireMD;  [facility];  Service: Cardiovascular;  Laterality: N/A;  . CORONARY ANGIOPLASTY WITH STENT PLACEMENT  10/01/2014    Family History  Problem Relation Age of Onset  . Heart attack Father        complications  . Cancer Brother   No bleeding or clotting disorders. No aneurysms  Social History Social History   Tobacco Use  . Smoking status: Former Smoker    Packs/day: 3.00    Years: 0.00    Pack years: 0.00    Types: Cigarettes    Last attempt to quit: 09/07/2009    Years since quitting: 8.4  . Smokeless tobacco: Never Used  . Tobacco comment: quit june 2011  Substance Use Topics  . Alcohol use: Yes    Comment: every other weekend  . Drug use: No     No Known Allergies  Current Outpatient Medications  Medication Sig Dispense Refill  . aspirin 81 MG tablet Take 81 mg by mouth daily.      . Marland Kitchentorvastatin (LIPITOR) 80 MG tablet Take 1 tablet (80 mg total) by mouth daily. 90 tablet 1  . blood glucose meter kit and supplies KIT Dispense based on patient  and insurance preference. Check CBGs 4 times daily. Dx code E11.9. 1 each 0  . budesonide-formoterol (SYMBICORT) 160-4.5 MCG/ACT inhaler Inhale 2 puffs into the lungs 2 (two) times daily. 10.2 g 4  . carvedilol (COREG) 12.5 MG tablet Take 1 tablet (12.5 mg total) by mouth 2 (two) times daily with a meal. 180 tablet 1  . clonazePAM (KLONOPIN) 0.5 MG tablet Take 0.5 tablets (0.25 mg total) by mouth 2 (two) times daily as needed for anxiety. 5 tablet 0  . clopidogrel (PLAVIX) 75 MG tablet Take 1 tablet (75 mg total) by mouth daily. 90 tablet 1  . CONTOUR NEXT TEST test strip TEST TWICE DAILY 50 each 2  . Dulaglutide (TRULICITY) 1.5 SA/6.3KZ SOPN Inject 1.5 mg into the skin once a week. 12 pen 3  . empagliflozin (JARDIANCE) 25 MG TABS tablet Take 25 mg by mouth  daily. 90 tablet 3  . furosemide (LASIX) 40 MG tablet Take 1 tablet (40 mg total) by mouth daily. 90 tablet 1  . HYDROcodone-acetaminophen (NORCO/VICODIN) 5-325 MG tablet Take 1 tablet by mouth every 4 (four) hours as needed for moderate pain. 10 tablet 0  . insulin aspart (NOVOLOG FLEXPEN) 100 UNIT/ML FlexPen Inject 15 units under the skin with meals. 15 mL 2  . insulin degludec (TRESIBA FLEXTOUCH) 100 UNIT/ML SOPN FlexTouch Pen INJECT 50 UNITS DAILY AT 10 PM INTO THE SKIN 15 mL 1  . Insulin Pen Needle (PEN NEEDLES) 32G X 6 MM MISC 1 each by Does not apply route 2 (two) times daily. 100 each 11  . isosorbide mononitrate (IMDUR) 30 MG 24 hr tablet Take 1 tablet (30 mg total) by mouth daily. 90 tablet 3  . Lancets Misc. (ACCU-CHEK SOFTCLIX LANCET DEV) KIT Dispense one lancing device to test blood glucose four times daily. Dx code E11.9 1 kit 0  . lisinopril (PRINIVIL,ZESTRIL) 2.5 MG tablet Take 1 tablet (2.5 mg total) by mouth daily. 90 tablet 1  . methocarbamol (ROBAXIN) 500 MG tablet Take 1 tablet (500 mg total) by mouth 4 (four) times daily. 16 tablet 0  . nitroGLYCERIN (NITROSTAT) 0.4 MG SL tablet Place 0.4 mg under the tongue every 5 (five) minutes as needed for chest pain.     . potassium chloride (MICRO-K) 10 MEQ CR capsule Take 1 capsule (10 mEq total) by mouth daily. Take with lasix 90 capsule 1  . pregabalin (LYRICA) 50 MG capsule Take 1 capsule (50 mg total) by mouth 2 (two) times daily. 60 capsule 2  . ranitidine (ZANTAC) 150 MG capsule Take by mouth.    . tamsulosin (FLOMAX) 0.4 MG CAPS capsule Take 1 capsule (0.4 mg total) by mouth daily. 90 capsule 1  . VENTOLIN HFA 108 (90 Base) MCG/ACT inhaler Inhale 2 puffs into the lungs every 6 (six) hours as needed for wheezing or shortness of breath. 1 Inhaler 0   No current facility-administered medications for this visit.       REVIEW OF SYSTEMS (Negative unless checked)  Constitutional: _0 Weight loss  _1 Fever  _2 Chills Cardiac:  _3 Chest pain   _4 Chest pressure   _5 Palpitations   _6 Shortness of breath when laying flat   _7 Shortness of breath at rest   _8 Shortness of breath with exertion. Vascular:  _9 Pain in legs with walking   _10 Pain in legs at rest   _11 Pain in legs when laying flat   _12 Claudication   _13 Pain in feet when walking  _14 Pain in feet at rest  _15 Pain in feet when laying flat   _16 History  of DVT   _0 Phlebitis   _1 Swelling in legs   _2 Varicose veins   _3 Non-healing ulcers Pulmonary:   _4 Uses home oxygen   _5 Productive cough   _6 Hemoptysis   _7 Wheeze  _8 COPD   _9 Asthma Neurologic:  _10 Dizziness  _11 Blackouts   _12 Seizures   _13 History of stroke   _14 History of TIA  _15 Aphasia   _16 Temporary blindness   _17 Dysphagia   _18 Weakness or numbness in arms   _19 Weakness or numbness in legs Musculoskeletal:  _20 Arthritis   _21 Joint swelling   _22 Joint pain   _23 Low back pain Hematologic:  _24 Easy bruising  _25 Easy bleeding   _26 Hypercoagulable state   _27 Anemic  _28 Hepatitis Gastrointestinal:  _29 Blood in stool   _30 Vomiting blood  _31 Gastroesophageal reflux/heartburn   _32 Abdominal pain Genitourinary:  _33 Chronic kidney disease   _34 Difficult urination  _35 Frequent urination  _36 Burning with urination   _37 Hematuria Skin:  _38 Rashes   _39 Ulcers   _40 Wounds Psychological:  _41 History of anxiety   _42  History of major depression.    Physical Exam BP (!) 173/97 (BP Location: Right Arm, Patient Position: Sitting)   Pulse 82   Resp 17   Ht _43  (1.803 m)   Wt 241 lb (109.3 kg)   BMI 33.61 kg/m  Gen:  WD/WN, NAD Head: /AT, No temporalis wasting. Ear/Nose/Throat: Hearing grossly intact, nares w/o erythema or drainage, oropharynx w/o Erythema/Exudate Eyes: Conjunctiva clear, sclera non-icteric  Neck: trachea midline.  No JVD.  Pulmonary:  Good air movement, respirations not labored, no use of accessory muscles Cardiac: RRR, no JVD Vascular:  Vessel Right Left  Radial Palpable Palpable                          PT 1+ Palpable 1+ Palpable    DP 1+ Palpable 1+ Palpable    Musculoskeletal: M/S 5/5 throughout. Numbness of feet. Thick, dark toenails. No deformity or atrophy. Mild LE edema. Neurologic: Sensation in feet and lower legs reduced.  Symmetrical.  Speech is fluent. Motor exam as listed above. Psychiatric: Judgment intact, Mood & affect appropriate for pt's clinical situation. Dermatologic: No rashes or ulcers noted.  No cellulitis or open wounds.  Radiology No results found.  Labs Recent Results (from the past 2160 hour(s))  POCT HgB A1C     Status: Abnormal   Collection Time: 01/06/18  8:35 AM  Result Value Ref Range   Hemoglobin A1C 9.5 (A) 4.0 - 5.6 %   HbA1c, POC (prediabetic range)  5.7 - 6.4 %   HbA1c, POC (controlled diabetic range)  0.0 - 7.0 %  POCT Glucose (CBG)     Status: Abnormal   Collection Time: 01/20/18 11:44 AM  Result Value Ref Range   POC Glucose 534 (A) 70 - 99 mg/dl  POCT Glucose (CBG)     Status: Abnormal   Collection Time: 01/20/18 11:56 AM  Result Value Ref Range   POC Glucose 491 (A) 70 - 99 mg/dl  CBC     Status: None   Collection Time: 01/20/18 12:18 PM  Result Value Ref Range   WBC 8.9 4.0 - 10.5 K/uL   RBC 5.20 4.22 - 5.81 Mil/uL   Platelets 201.0 150.0 - 400.0 K/uL   Hemoglobin 16.2 13.0 - 17.0 g/dL   HCT 49.1 39.0 - 52.0 %   MCV 94.5 78.0 - 100.0 fl   MCHC 33.1 30.0 - 36.0 g/dL   RDW 14.1 11.5 - 15.5 %  Comp Met (CMET)     Status: Abnormal  Collection Time: 01/20/18 12:18 PM  Result Value Ref Range   Sodium 135 135 - 145 mEq/L   Potassium 4.4 3.5 - 5.1 mEq/L   Chloride 102 96 - 112 mEq/L   CO2 25 19 - 32 mEq/L   Glucose, Bld 496 (H) 70 - 99 mg/dL   BUN 48 (H) 6 - 23 mg/dL   Creatinine, Ser 1.51 (H) 0.40 - 1.50 mg/dL   Total Bilirubin 0.4 0.2 - 1.2 mg/dL   Alkaline Phosphatase 84 39 - 117 U/L   AST 16 0 - 37 U/L   ALT 26 0 - 53 U/L   Total Protein 6.5 6.0 - 8.3 g/dL   Albumin 3.9 3.5 - 5.2 g/dL   Calcium 9.1 8.4 - 10.5 mg/dL   GFR 49.24 (L) >60.00 mL/min     Assessment/Plan:  Essential hypertension blood pressure control important in reducing the progression of atherosclerotic disease. On appropriate oral medications.   CAD (coronary artery disease) Previous coronary interventions  Diabetes mellitus type 2, uncontrolled, with complications (HCC) blood glucose control important in reducing the progression of atherosclerotic disease. Also, involved in wound healing. On appropriate medications.   Hyperlipidemia lipid control important in reducing the progression of atherosclerotic disease. Continue statin therapy   Atherosclerosis of native arteries of extremity with intermittent claudication ([place]) The patient has previously had ABIs that showed pressures in the mildly reduced range but there was significant dampening of the tibial and digital waveforms.  He likely has significant tibial disease from his diabetes.  He is highly symptomatic.  I have discussed that he does not have a limb threatening situation.  I have discussed that a significant portion of his symptoms may be related to neuropathy which may or may not improve with revascularization.  The patient voices his understanding but says he is miserable and desires to have anything he can performed to try to improve his legs.  I have offered him angiograms of both lower extremities for further evaluation and potential treatment.  He is already on maximal medical therapy with aspirin, Plavix, and a statin agent and a high dose.  He would like to proceed with angiography and this will be scheduled at his convenience in the near future.  He understands that this will require 2 separate procedures, one for each leg.      Leotis Pain 02/11/2018, 9:44 AM   This note was created with Dragon medical transcription system.  Any errors from dictation are unintentional.

## 2018-02-11 NOTE — Assessment & Plan Note (Signed)
blood pressure control important in reducing the progression of atherosclerotic disease. On appropriate oral medications.  

## 2018-02-11 NOTE — Assessment & Plan Note (Signed)
blood glucose control important in reducing the progression of atherosclerotic disease. Also, involved in wound healing. On appropriate medications.  

## 2018-02-14 ENCOUNTER — Inpatient Hospital Stay: Admission: RE | Admit: 2018-02-14 | Payer: Medicare HMO | Source: Ambulatory Visit

## 2018-02-17 ENCOUNTER — Ambulatory Visit: Admission: RE | Admit: 2018-02-17 | Payer: Medicare HMO | Source: Ambulatory Visit | Admitting: Vascular Surgery

## 2018-02-17 ENCOUNTER — Encounter: Admission: RE | Payer: Self-pay | Source: Ambulatory Visit

## 2018-02-17 ENCOUNTER — Other Ambulatory Visit (INDEPENDENT_AMBULATORY_CARE_PROVIDER_SITE_OTHER): Payer: Self-pay | Admitting: Vascular Surgery

## 2018-02-17 SURGERY — LOWER EXTREMITY ANGIOGRAPHY
Anesthesia: Moderate Sedation | Laterality: Right

## 2018-02-17 MED ORDER — FAMOTIDINE 10 MG PO TABS: 40.0000 mg | ORAL_TABLET | ORAL | Status: DC | PRN

## 2018-02-17 MED ORDER — SODIUM CHLORIDE 0.9 % IV SOLN: 125.0000 mg | INTRAVENOUS | Status: DC | PRN

## 2018-02-18 NOTE — Progress Notes (Signed)
Cardiology Office Note  Date:  02/19/2018   ID:  Danny Organ Sr., DOB 15-Mar-1951, MRN [id number]  PCP:  [name], MD   Chief Complaint  Patient presents with  . other    Pt. was to have a lower extremity angiography with Dr. Lucky Cowboy but cancelled due to being nervous. Meds reviewed by the pt. verbally. Pt. c/o bilateral leg pain and is having shortness of breath.     HPI:  67 year old gentleman with a  long smoking history,  coronary artery disease,  cardiac catheterization showing severe two-vessel disease,  severe stenosis of the proximal to mid LAD, severe ostial O1 and O2 disease, moderate to severe mid left circumflex disease that appeared aneurysmal, moderate OM 2 disease ejection fraction 40%  PCI of the LAD on January 11, 2010,  smoke 4 packs a day who stopped earlier in 2011,  Chronic renal insufficiency,  poorly controlled diabetes on insulin,  hyperlipidemia who presents for routine followup of his coronary artery disease.  n follow-up today he reports recent evaluation by Dr. Lucky Cowboy  Scheduled for lower extremity angiography on Monday next week Very nervous, does not know if he will cancel Reports having some pain in his feet Walks with a cane ABIs reviewed with him which were abnormal  Denies any significant chest pain Last several days felt weak Blood pressure running low today, has not been drinking much fluids Systolic pressure in the 23F heart rate 101 Takes Lasix 40 daily Prior history of orthostasis, isosorbide previously reduced  Neuropathy in his legs, taste Neurontin  EKG personally reviewed by myself on todays visit  showsSinus tachycardia rate 101 bpm no significant ST or T-wave changes  Other past medical history reviewed emergency room 05/01/2016 for chest pain. Developed after he was eating a biscuit Hospital records reviewed He was not taking his insulin, drinking Coca-Cola when he had symptoms Took several nitroglycerin, aspirin by EMS.  Still had chest discomfort in the ER Cardiac enzymes negative. Symptoms possibly GI related Reports he was not wearing oxygen  Chronic mild shortness of breath which is stable, denies  chest pain Denies any significant lower extremity edema, no PND or orthopnea He sees Dr. Gala Murdoch office every 3 months  Somewhat unsure about his medications, thinks he is taking them all Weight is up, eating poorly, no exercise, poor diet in general.  Prior to his last clinic visit, he had 2 bags of potato chips. He woke up later overnight with significant chest discomfort/upper epigastric pain. He took nitroglycerin which helped his symptoms for 30 minutes but pain returned. He took several nitroglycerin through the night. Symptoms better the next day, Sunday and feels well today. He has had GI issues before with symptoms of GERD. He reports that PPI was stopped by primary care some time ago. Prior episodes of syncope, was sitting at the time, witnessed by a friend. Resolved quickly with no postictal changes. No symptoms since that time several years ago  He was seen in the hospital 11/23/2014 with elevated glucose of more than 300, also with creatinine of 2, BUN 40. He was given saline IV and discharged home.  lab work from January 2016 shows creatinine 1.67, BUN 33, glucose 520, hemoglobin A1c 14, total cholesterol 168, LDL 71  PMH:   has a past medical history of CAD (coronary artery disease), Chronic combined systolic and diastolic CHF (congestive heart failure) ([place]), CKD (chronic kidney disease), stage III ([place]), COPD (chronic obstructive pulmonary disease) ([place]), DM2 (diabetes mellitus,  type 2) ([place]), Erectile dysfunction, HLD (hyperlipidemia), HTN (hypertension), and Ischemic cardiomyopathy.  PSH:    Past Surgical History:  Procedure Laterality Date  . CAD: stent to the LAD    . CARDIAC CATHETERIZATION  10/01/2014  . CARDIAC CATHETERIZATION N/A 07/23/2016   Procedure: Left Heart Cath and  Coronary Angiography;  Surgeon: [name], MD;  Location: [facility];  Service: Cardiovascular;  Laterality: N/A;  . CORONARY ANGIOPLASTY WITH STENT PLACEMENT  10/01/2014    Current Outpatient Medications  Medication Sig Dispense Refill  . aspirin 81 MG tablet Take 81 mg by mouth daily.      [name] atorvastatin (LIPITOR) 80 MG tablet Take 1 tablet (80 mg total) by mouth daily. 90 tablet 1  . blood glucose meter kit and supplies KIT Dispense based on patient and insurance preference. Check CBGs 4 times daily. Dx code E11.9. 1 each 0  . budesonide-formoterol (SYMBICORT) 160-4.5 MCG/ACT inhaler Inhale 2 puffs into the lungs 2 (two) times daily. 10.2 g 4  . carvedilol (COREG) 12.5 MG tablet Take 1 tablet (12.5 mg total) by mouth 2 (two) times daily with a meal. 180 tablet 1  . clonazePAM (KLONOPIN) 0.5 MG tablet Take 0.5 tablets (0.25 mg total) by mouth 2 (two) times daily as needed for anxiety. 5 tablet 0  . clopidogrel (PLAVIX) 75 MG tablet Take 1 tablet (75 mg total) by mouth daily. 90 tablet 1  . CONTOUR NEXT TEST test strip TEST TWICE DAILY 50 each 2  . Dulaglutide (TRULICITY) 1.5 ID/7.8EU SOPN Inject 1.5 mg into the skin once a week. 12 pen 3  . empagliflozin (JARDIANCE) 25 MG TABS tablet Take 25 mg by mouth daily. 90 tablet 3  . furosemide (LASIX) 40 MG tablet Take 1 tablet (40 mg total) by mouth daily. 90 tablet 1  . HYDROcodone-acetaminophen (NORCO/VICODIN) 5-325 MG tablet Take 1 tablet by mouth every 4 (four) hours as needed for moderate pain. 10 tablet 0  . insulin aspart (NOVOLOG FLEXPEN) 100 UNIT/ML FlexPen Inject 15 units under the skin with meals. 15 mL 2  . insulin degludec (TRESIBA FLEXTOUCH) 100 UNIT/ML SOPN FlexTouch Pen INJECT 50 UNITS DAILY AT 10 PM INTO THE SKIN 15 mL 1  . Insulin Pen Needle (PEN NEEDLES) 32G X 6 MM MISC 1 each by Does not apply route 2 (two) times daily. 100 each 11  . isosorbide mononitrate (IMDUR) 30 MG 24 hr tablet Take 1 tablet (30 mg total) by  mouth daily. 90 tablet 3  . Lancets Misc. (ACCU-CHEK SOFTCLIX LANCET DEV) KIT Dispense one lancing device to test blood glucose four times daily. Dx code E11.9 1 kit 0  . lisinopril (PRINIVIL,ZESTRIL) 2.5 MG tablet Take 1 tablet (2.5 mg total) by mouth daily. 90 tablet 1  . methocarbamol (ROBAXIN) 500 MG tablet Take 1 tablet (500 mg total) by mouth 4 (four) times daily. 16 tablet 0  . nitroGLYCERIN (NITROSTAT) 0.4 MG SL tablet Place 0.4 mg under the tongue every 5 (five) minutes as needed for chest pain.     . potassium chloride (MICRO-K) 10 MEQ CR capsule Take 1 capsule (10 mEq total) by mouth daily. Take with lasix 90 capsule 1  . VENTOLIN HFA 108 (90 Base) MCG/ACT inhaler Inhale 2 puffs into the lungs every 6 (six) hours as needed for wheezing or shortness of breath. 1 Inhaler 0   Current Facility-Administered Medications  Medication Dose Route Frequency Provider Last Rate Last Dose  . famotidine (PEPCID) tablet 40 mg  40 mg Oral PRN Stegmayer, Kimberly A, PA-C      . methylPREDNISolone sodium succinate (SOLU-MEDROL) 130 mg in sodium chloride 0.9 % 50 mL IVPB  130 mg Intravenous PRN Stegmayer, Janalyn Harder, PA-C         Allergies:   Patient has no known allergies.   Social History:  The patient  reports that he quit smoking about 8 years ago. His smoking use included cigarettes. He smoked 3.00 packs per day for 0.00 years. He has never used smokeless tobacco. He reports that he drinks alcohol. He reports that he does not use drugs.   Family History:   family history includes Cancer in his brother; Heart attack in his father.    Review of Systems: Review of Systems  Constitutional:       Polyuria  Respiratory: Negative.   Cardiovascular: Negative.   Gastrointestinal: Negative.   Musculoskeletal: Negative.        Pain in his legs  Neurological: Negative.   Psychiatric/Behavioral: Negative.   All other systems reviewed and are negative.    PHYSICAL EXAM: VS:  BP (!) 83/61 (BP  Location: Left Arm, Patient Position: Sitting, Cuff Size: Normal)   Pulse (!) 101   Ht '5\' 11"'$  (1.803 m)   Wt 240 lb 12 oz (109.2 kg)   BMI 33.58 kg/m  , BMI Body mass index is 33.58 kg/m. Constitutional:  oriented to person, place, and time. No distress. obese HENT:  Head: Normocephalic and atraumatic.  Eyes:  no discharge. No scleral icterus.  Neck: Normal range of motion. Neck supple. No JVD present.  Cardiovascular: Normal rate, regular rhythm, normal heart sounds and intact distal pulses. Exam reveals no gallop and no friction rub. No edema No murmur heard. Pulmonary/Chest: Effort normal and breath sounds normal. No stridor. No respiratory distress.  no wheezes.  no rales.  no tenderness.  Abdominal: Soft.  no distension.  no tenderness.  Musculoskeletal: Normal range of motion.  no  tenderness or deformity.  Neurological:  normal muscle tone. Coordination normal. No atrophy Skin: Skin is warm and dry. No rash noted. not diaphoretic.  Psychiatric:  normal mood and affect. behavior is normal. Thought content normal.   Recent Labs: 01/20/2018: ALT 26; BUN 48; Creatinine, Ser 1.51; Hemoglobin 16.2; Platelets 201.0; Potassium 4.4; Sodium 135    Lipid Panel Lab Results  Component Value Date   CHOL 92 03/05/2017   HDL 42.10 03/05/2017   LDLCALC 35 03/05/2017   TRIG 78.0 03/05/2017      Wt Readings from Last 3 Encounters:  02/19/18 240 lb 12 oz (109.2 kg)  02/11/18 241 lb (109.3 kg)  01/20/18 239 lb (108.4 kg)       ASSESSMENT AND PLAN:  Coronary artery disease involving native coronary artery of native heart without angina pectoris Currently with no symptoms of angina. No further workup at this time. Continue current medication regimen.  Stable   HYPERTENSION, BENIGN Hypertensive today Recommended he hold Lasix for 5 days After that would restart Lasix only 20 mg every other day Suggested he monitor blood pressure and call us with numbers Suspect prerenal state, this  is confirmed by recent lab work creatinine 1.5  Pure hypercholesterolemia Cholesterol is at goal on the current lipid regimen. No changes to the medications were made. Stable  Uncontrolled type 2 diabetes mellitus with complication, with long-term current use of insulin (HCC) Morbid obese Poor diet, unable to exercise Hemoglobin A1c 9.5  Centrilobular emphysema (HCC) Stop smoking 2011 Chronic shortness  of breath, stable No further workup at this time  PAD Long discussion with him today,  Will hold Lasix in preparation for his angiogram next week Very anxious, will likely require benzodiazepines once he arrives at the hospital for anxiety Recommended he hydrate in preparation for the Procedure  Morbid obesity ([place]) Stressed importance of strict diet given his poorly controlled diabetes Her weight continues to run high  Disposition:   F/U  6 months  I have called vein endovascular concerning his procedure next week and unfortunately unable to get through to a nurse  Total encounter time more than 45 minutes  Greater than 50% was spent in counseling and coordination of care with the patient     Signed, Esmond Plants, M.D., Ph.D. 02/19/2018  [place], [place]

## 2018-02-19 ENCOUNTER — Ambulatory Visit (INDEPENDENT_AMBULATORY_CARE_PROVIDER_SITE_OTHER): Payer: Medicare HMO | Admitting: Cardiovascular Disease

## 2018-02-19 ENCOUNTER — Encounter: Payer: Self-pay | Admitting: Cardiovascular Disease

## 2018-02-19 VITALS — BP 83/61 | HR 101 | Ht 71.0 in | Wt 240.8 lb

## 2018-02-19 DIAGNOSIS — J432 Centrilobular emphysema: Secondary | ICD-10-CM | POA: Diagnosis not present

## 2018-02-19 DIAGNOSIS — N183 Chronic kidney disease, stage 3 unspecified: Secondary | ICD-10-CM

## 2018-02-19 DIAGNOSIS — I25118 Atherosclerotic heart disease of native coronary artery with other forms of angina pectoris: Secondary | ICD-10-CM | POA: Diagnosis not present

## 2018-02-19 DIAGNOSIS — IMO0002 Reserved for concepts with insufficient information to code with codable children: Secondary | ICD-10-CM

## 2018-02-19 DIAGNOSIS — E118 Type 2 diabetes mellitus with unspecified complications: Secondary | ICD-10-CM | POA: Diagnosis not present

## 2018-02-19 DIAGNOSIS — I70213 Atherosclerosis of native arteries of extremities with intermittent claudication, bilateral legs: Secondary | ICD-10-CM | POA: Diagnosis not present

## 2018-02-19 DIAGNOSIS — I10 Essential (primary) hypertension: Secondary | ICD-10-CM

## 2018-02-19 DIAGNOSIS — I5042 Chronic combined systolic (congestive) and diastolic (congestive) heart failure: Secondary | ICD-10-CM

## 2018-02-19 DIAGNOSIS — E1165 Type 2 diabetes mellitus with hyperglycemia: Secondary | ICD-10-CM

## 2018-02-19 MED ORDER — FUROSEMIDE 20 MG PO TABS: 20.0000 mg | ORAL_TABLET | ORAL | 3 refills | Status: DC

## 2018-02-19 NOTE — Patient Instructions (Addendum)
Medication Instructions:   Please hold the furosemide /lasix for a few days, till Tuesday Then restart 1/2 pill 20 mg every other day  Labwork:  No new labs needed  Testing/Procedures:  No further testing at this time   Follow-Up: It was a pleasure seeing you in the office today. Please call us if you have new issues that need to be addressed before your next appt.  [phone]  Your physician wants you to follow-up in: 6 months.  You will receive a reminder letter in the mail two months in advance. If you don't receive a letter, please call our office to schedule the follow-up appointment.  If you need a refill on your cardiac medications before your next appointment, please call your pharmacy.  For educational health videos Log in to : [email or web address] Or : [email or web address], password : [email or web address]

## 2018-02-24 ENCOUNTER — Encounter: Admission: RE | Payer: Self-pay | Source: Ambulatory Visit

## 2018-02-24 ENCOUNTER — Ambulatory Visit: Admission: RE | Admit: 2018-02-24 | Payer: Medicare HMO | Source: Ambulatory Visit | Admitting: Vascular Surgery

## 2018-02-24 SURGERY — LOWER EXTREMITY ANGIOGRAPHY
Anesthesia: Moderate Sedation | Laterality: Left

## 2018-03-03 ENCOUNTER — Other Ambulatory Visit: Payer: Self-pay | Admitting: Family Medicine

## 2018-03-03 DIAGNOSIS — E118 Type 2 diabetes mellitus with unspecified complications: Principal | ICD-10-CM

## 2018-03-03 DIAGNOSIS — E1165 Type 2 diabetes mellitus with hyperglycemia: Secondary | ICD-10-CM

## 2018-03-03 DIAGNOSIS — IMO0002 Reserved for concepts with insufficient information to code with codable children: Secondary | ICD-10-CM

## 2018-03-06 ENCOUNTER — Other Ambulatory Visit: Payer: Self-pay | Admitting: Family Medicine

## 2018-03-20 ENCOUNTER — Ambulatory Visit (INDEPENDENT_AMBULATORY_CARE_PROVIDER_SITE_OTHER): Payer: Medicare Other

## 2018-03-20 VITALS — BP 138/78 | HR 89 | Temp 97.7°F | Resp 17 | Ht 71.0 in | Wt 242.0 lb

## 2018-03-20 DIAGNOSIS — IMO0002 Reserved for concepts with insufficient information to code with codable children: Secondary | ICD-10-CM

## 2018-03-20 DIAGNOSIS — E1165 Type 2 diabetes mellitus with hyperglycemia: Secondary | ICD-10-CM | POA: Diagnosis not present

## 2018-03-20 DIAGNOSIS — E785 Hyperlipidemia, unspecified: Secondary | ICD-10-CM

## 2018-03-20 DIAGNOSIS — Z0000 Encounter for general adult medical examination without abnormal findings: Secondary | ICD-10-CM | POA: Diagnosis not present

## 2018-03-20 DIAGNOSIS — Z1159 Encounter for screening for other viral diseases: Secondary | ICD-10-CM | POA: Diagnosis not present

## 2018-03-20 DIAGNOSIS — E118 Type 2 diabetes mellitus with unspecified complications: Secondary | ICD-10-CM

## 2018-03-20 LAB — LDL CHOLESTEROL, DIRECT: Direct LDL: 64 mg/dL

## 2018-03-20 LAB — HEMOGLOBIN A1C: Hgb A1c MFr Bld: 9 % — ABNORMAL HIGH (ref 4.6–6.5)

## 2018-03-20 NOTE — Progress Notes (Signed)
Subjective:   Cristopher Estimable Macchi Sr. is a 67 y.o. male who presents for Medicare Annual/Subsequent preventive examination.  Review of Systems:  No ROS.  Medicare Wellness Visit. Additional risk factors are reflected in the social history.  Cardiac Risk Factors include: advanced age (>59mn, >>36women);hypertension;male gender;diabetes mellitus;obesity (BMI >30kg/m2)     Objective:    Vitals: BP 138/78 (BP Location: Left Arm, Patient Position: Sitting, Cuff Size: Normal)   Pulse 89   Temp 97.7 F (36.5 C) (Oral)   Resp 17   Ht '5\' 11"'$  (1.803 m)   Wt 242 lb (109.8 kg)   SpO2 96%   BMI 33.75 kg/m   Body mass index is 33.75 kg/m.  Advanced Directives 03/20/2018 06/17/2017 04/30/2017 08/31/2016 08/17/2016 07/23/2016 07/21/2016  Does Patient Have a Medical Advance Directive? No No No No No No No  Would patient like information on creating a medical advance directive? No - Patient declined - No - Patient declined No - Patient declined - No - Patient declined No - Patient declined    Tobacco Social History   Tobacco Use  Smoking Status Former Smoker  . Packs/day: 3.00  . Years: 0.00  . Pack years: 0.00  . Types: Cigarettes  . Last attempt to quit: 09/07/2009  . Years since quitting: 8.5  Smokeless Tobacco Never Used  Tobacco Comment   quit june 2011     Counseling given: Not Answered Comment: quit june 2011   Clinical Intake:  Pre-visit preparation completed: Yes  Pain : No/denies pain     Nutritional Status: BMI > 30  Obese Diabetes: Yes(Followed by pcp)  How often do you need to have someone help you when you read instructions, pamphlets, or other written materials from your doctor or pharmacy?: 1 - Never  Interpreter Needed?: No     Past Medical History:  Diagnosis Date  . CAD (coronary artery disease)    a. 01/2010 PCI of LAD; b. 09/2014 PCI/DES of mLAD due to ISR. D1 80, D2 80(jailed), LCX 566mc. 07/2016 NSTEMI/Cath: LM nl, LAD 2090mR, 40d, D1 90ost, 80p, D2  90ost, RI min irregs, LCX min irregs, OM1 90 small, OM2/3 min irregs, RCA min irregs, RPLB1 90, EF 25-35%.  . Chronic combined systolic and diastolic CHF (congestive heart failure) ([facility]  a. 07/2016 Echo: EF 30%, severe septal/anterior HK, Gr1 DD.  . CMarland KitchenD (chronic kidney disease), stage III ([facility] . COPD ([place] disease[place] . DM2 (diabetes mellitus, type 2) ([facility] . Erectile dysfunction   . HLD (hyperlipidemia)   . HTN (hypertension)   . Ischemic cardiomyopathy    a. 07/2016 Echo: EF 30% w/ sev septal/ant HK. Gr1 DD.   Past Surgical History:  Procedure Laterality Date  . CAD: stent to the LAD    . CARDIAC CATHETERIZATION  10/01/2014  . CARDIAC CATHETERIZATION N/A 07/23/2016   Procedure: Left Heart Cath and Coronary Angiography;  Surgeon: MuhWellington HampshireD;  [facility];  Service: Cardiovascular;  Laterality: N/A;  . CORONARY ANGIOPLASTY WITH STENT PLACEMENT  10/01/2014   Family History  Problem Relation Age of Onset  . Heart attack Father        complications  . Cancer Brother    Social History   Socioeconomic History  . Marital status: Single    Spouse name: Not on file  . Number of children: Not on file  . Years of education: Not on file  .  Highest education level: Not on file  Occupational History  . Not on file  Social Needs  . Financial resource strain: Not hard at all  . Food insecurity:    Worry: Never true    Inability: Never true  . Transportation needs:    Medical: No    Non-medical: No  Tobacco Use  . Smoking status: Former Smoker    Packs/day: 3.00    Years: 0.00    Pack years: 0.00    Types: Cigarettes    Last attempt to quit: 09/07/2009    Years since quitting: 8.5  . Smokeless tobacco: Never Used  . Tobacco comment: quit june 2011  Substance and Sexual Activity  . Alcohol use: Yes    Comment: every other weekend  . Drug use: No  . Sexual activity: Not on file  Lifestyle  . Physical activity:     Days per week: Not on file    Minutes per session: Not on file  . Stress: Not on file  Relationships  . Social connections:    Talks on phone: Not on file    Gets together: Not on file    Attends religious service: Not on file    Active member of club or organization: Not on file    Attends meetings of clubs or organizations: Not on file    Relationship status: Not on file  Other Topics Concern  . Not on file  Social History Narrative  . Not on file    Outpatient Encounter Medications as of 03/20/2018  Medication Sig  . aspirin 81 MG tablet Take 81 mg by mouth daily.    [name] atorvastatin (LIPITOR) 80 MG tablet Take 1 tablet (80 mg total) by mouth daily.  . blood glucose meter kit and supplies KIT Dispense based on patient and insurance preference. Check CBGs 4 times daily. Dx code E11.9.  . budesonide-formoterol (SYMBICORT) 160-4.5 MCG/ACT inhaler Inhale 2 puffs into the lungs 2 (two) times daily.  . carvedilol (COREG) 12.5 MG tablet Take 1 tablet (12.5 mg total) by mouth 2 (two) times daily with a meal.  . clonazePAM (KLONOPIN) 0.5 MG tablet Take 0.5 tablets (0.25 mg total) by mouth 2 (two) times daily as needed for anxiety.  . clopidogrel (PLAVIX) 75 MG tablet Take 1 tablet (75 mg total) by mouth daily.  . CONTOUR NEXT TEST test strip TEST TWICE DAILY  . Dulaglutide (TRULICITY) 1.5 VO/5.3GU SOPN Inject 1.5 mg into the skin once a week.  . empagliflozin (JARDIANCE) 25 MG TABS tablet Take 25 mg by mouth daily.  . furosemide (LASIX) 20 MG tablet Take 1 tablet (20 mg total) by mouth every other day.  [name] HYDROcodone-acetaminophen (NORCO/VICODIN) 5-325 MG tablet Take 1 tablet by mouth every 4 (four) hours as needed for moderate pain.  [name] insulin aspart (NOVOLOG FLEXPEN) 100 UNIT/ML FlexPen Inject 15 units under the skin with meals.  . Insulin Pen Needle (PEN NEEDLES) 32G X 6 MM MISC 1 each by Does not apply route 2 (two) times daily.  . isosorbide mononitrate (IMDUR) 30 MG 24 hr tablet Take 1  tablet (30 mg total) by mouth daily.  . Lancets Misc. (ACCU-CHEK SOFTCLIX LANCET DEV) KIT Dispense one lancing device to test blood glucose four times daily. Dx code E11.9  . lisinopril (PRINIVIL,ZESTRIL) 2.5 MG tablet Take 1 tablet (2.5 mg total) by mouth daily.  . methocarbamol (ROBAXIN) 500 MG tablet Take 1 tablet (500 mg total) by mouth 4 (four) times daily.  . nitroGLYCERIN (  NITROSTAT) 0.4 MG SL tablet Place 0.4 mg under the tongue every 5 (five) minutes as needed for chest pain.   . potassium chloride (MICRO-K) 10 MEQ CR capsule Take 1 capsule (10 mEq total) by mouth daily. Take with lasix  . TRESIBA FLEXTOUCH 100 UNIT/ML SOPN FlexTouch Pen INJECT 50 UNITS DAILY AT 10PM INTO THE SKIN  . VENTOLIN HFA 108 (90 Base) MCG/ACT inhaler Inhale 2 puffs into the lungs every 6 (six) hours as needed for wheezing or shortness of breath.  . furosemide (LASIX) 40 MG tablet TAKE ONE TABLET BY MOUTH EVERY DAY (Patient not taking: Reported on 03/20/2018)   Facility-Administered Encounter Medications as of 03/20/2018  Medication  . famotidine (PEPCID) tablet 40 mg  . methylPREDNISolone sodium succinate (SOLU-MEDROL) 130 mg in sodium chloride 0.9 % 50 mL IVPB    Activities of Daily Living In your present state of health, do you have any difficulty performing the following activities: 03/20/2018  Hearing? N  Vision? N  Difficulty concentrating or making decisions? N  Walking or climbing stairs? Y  Dressing or bathing? N  Doing errands, shopping? N  Preparing Food and eating ? N  Using the Toilet? N  In the past six months, have you accidently leaked urine? N  Do you have problems with loss of bowel control? N  Managing your Medications? N  Managing your Finances? Y  Comment Mom helps  Housekeeping or managing your Housekeeping? Y  Comment Mom helps  Some recent data might be hidden    Patient Care Team: [name], MD as PCP - General (Family Medicine) [name], MD as Consulting  Physician (Cardiology)   Assessment:   This is a routine wellness examination for Daquavion.The goal of the wellness visit is to assist the patient how to close the gaps in care and create a preventative care plan for the patient.   The roster of all physicians providing medical care to patient is listed in the Snapshot section of the chart.  His mother stays with him and they help one another with home management, although he is the primary care taker.States he is doing relatively well over all.  No acute issue today. Not taking Lasix '40mg'$ . Taking lasix '20mg'$  every day, although written as every other day.  Encouraged to take as directed.  Keep routine scheduled appointment with pcp tomorrow.   Osteoporosis risk reviewed.    Safety issues reviewed; Smoke and carbon monoxide detectors in the home. No firearms in the home. Wears seatbelts when driving or riding with others. No violence in the home.  They do not have excessive sun exposure.  Discussed the need for sun protection: hats, long sleeves and the use of sunscreen if there is significant sun exposure.  Patient is alert, normal appearance, oriented to person/place/and time.  Correctly identified the president of the [place] and recalls of 2/3 words. Performs simple calculations and can read correct time from watch face.  Displays appropriate judgement.  No new identified risk were noted.  No failures at ADL's or IADL's.  Ambulates with cane as needed.   BMI- discussed the importance of a healthy diet, water intake and the benefits of aerobic exercise. Educational material provided.   24 hour diet recall: Regular diet.  Sleep patterns- Sleeps without issues.   Prevnar 13 and TDAP declined.   Hep C screening consent given.   Health maintenance gaps- closed.  Exercise Activities and Dietary recommendations Current Exercise Habits: The patient does not participate in  regular exercise at present  Goals    . Lose weight        Fall Risk Fall Risk  03/20/2018 12/19/2017 08/21/2017 07/02/2017 05/07/2017  Falls in the past year? No No No No Yes  Number falls in past yr: - - - - 1  Injury with Fall? - - - - No   Depression Screen PHQ 2/9 Scores 03/20/2018  PHQ - 2 Score 0    Cognitive Function     6CIT Screen 03/20/2018  What Year? 0 points  What month? 0 points  What time? 0 points  Count back from 20 0 points  Months in reverse 4 points     There is no immunization history on file for this patient.  Screening Tests Health Maintenance  Topic Date Due  . OPHTHALMOLOGY EXAM  01/22/1961  . INFLUENZA VACCINE  03/06/2018  . COLONOSCOPY  01/21/2019 (Originally 01/22/2001)  . TETANUS/TDAP  03/21/2019 (Originally 01/22/1970)  . PNA vac Low Risk Adult (1 of 2 - PCV13) 03/21/2019 (Originally 01/23/2016)  . HEMOGLOBIN A1C  07/08/2018  . FOOT EXAM  12/20/2018  . Hepatitis C Screening  Completed       Plan:   End of life planning; Advanced aging; Advanced directives discussed.  No HCPOA/Living Will.  Additional information declined at this time.  I have personally reviewed and noted the following in the patient's chart:   . Medical and social history . Use of alcohol, tobacco or illicit drugs  . Current medications and supplements . Functional ability and status . Nutritional status . Physical activity . Advanced directives . List of other physicians . Hospitalizations, surgeries, and ER visits in previous 12 months . Vitals . Screenings to include cognitive, depression, and falls . Referrals and appointments  In addition, I have reviewed and discussed with patient certain preventive protocols, quality metrics, and best practice recommendations. A written personalized care plan for preventive services as well as general preventive health recommendations were provided to patient.     Varney Biles, LPN  11/12/8117   Reviewed above information.  Agree with assessment and plan.   Dr  Nicki Reaper

## 2018-03-20 NOTE — Patient Instructions (Addendum)
  [name] , Thank you for taking time to come for your Medicare Wellness Visit. I appreciate your ongoing commitment to your health goals. Please review the following plan we discussed and let me know if I can assist you in the future.   These are the goals we discussed: Goals    . Lose weight       This is a list of the screening recommended for you and due dates:  Health Maintenance  Topic Date Due  . Eye exam for diabetics  01/22/1961  . Flu Shot  03/06/2018  . Colon Cancer Screening  01/21/2019*  . Tetanus Vaccine  03/21/2019*  . Pneumonia vaccines (1 of 2 - PCV13) 03/21/2019*  . Hemoglobin A1C  07/08/2018  . Complete foot exam   12/20/2018  .  Hepatitis C: One time screening is recommended by Center for Disease Control  (CDC) for  adults born from [birth year] through 1965.   Completed  *Topic was postponed. The date shown is not the original due date.

## 2018-03-21 ENCOUNTER — Encounter: Payer: Self-pay | Admitting: Family Medicine

## 2018-03-21 ENCOUNTER — Ambulatory Visit: Payer: Medicare HMO

## 2018-03-21 ENCOUNTER — Ambulatory Visit (INDEPENDENT_AMBULATORY_CARE_PROVIDER_SITE_OTHER): Payer: Medicare Other | Admitting: Family Medicine

## 2018-03-21 VITALS — BP 152/90 | HR 94 | Temp 97.9°F | Wt 242.2 lb

## 2018-03-21 DIAGNOSIS — I10 Essential (primary) hypertension: Secondary | ICD-10-CM

## 2018-03-21 DIAGNOSIS — E1165 Type 2 diabetes mellitus with hyperglycemia: Secondary | ICD-10-CM | POA: Diagnosis not present

## 2018-03-21 DIAGNOSIS — E118 Type 2 diabetes mellitus with unspecified complications: Secondary | ICD-10-CM | POA: Diagnosis not present

## 2018-03-21 DIAGNOSIS — I70213 Atherosclerosis of native arteries of extremities with intermittent claudication, bilateral legs: Secondary | ICD-10-CM

## 2018-03-21 DIAGNOSIS — K219 Gastro-esophageal reflux disease without esophagitis: Secondary | ICD-10-CM | POA: Diagnosis not present

## 2018-03-21 DIAGNOSIS — IMO0002 Reserved for concepts with insufficient information to code with codable children: Secondary | ICD-10-CM

## 2018-03-21 LAB — BASIC METABOLIC PANEL
BUN: 30 mg/dL — ABNORMAL HIGH (ref 6–23)
CO2: 26 mEq/L (ref 19–32)
Calcium: 9.5 mg/dL (ref 8.4–10.5)
Chloride: 102 mEq/L (ref 96–112)
Creatinine, Ser: 1.41 mg/dL (ref 0.40–1.50)
GFR: 53.26 mL/min — ABNORMAL LOW (ref >60.00)
Glucose, Bld: 308 mg/dL — ABNORMAL HIGH (ref 70–99)
Potassium: 4.9 mEq/L (ref 3.5–5.1)
Sodium: 136 mEq/L (ref 135–145)

## 2018-03-21 LAB — HEPATITIS C ANTIBODY
Hepatitis C Ab: NONREACTIVE
SIGNAL TO CUT-OFF: 0.09 (ref <1.00)

## 2018-03-21 NOTE — Assessment & Plan Note (Signed)
Not controlled.  It appears he did not increase his NovoLog as previously advised by our clinical pharmacist.  We will have him increase to 10 units in the morning and he will continue 10 units of NovoLog at night.  He will continue his other regimen.  Could consider increasing further once he sees clinical pharmacy on Monday.

## 2018-03-21 NOTE — Assessment & Plan Note (Signed)
Patient notes no claudication symptoms at this time.  I reinforced that he should see vascular surgery to have angiography as his vascular disease potentially could progress at some point leading to further issues with his lower extremities.  He noted he would do this when he is able.

## 2018-03-21 NOTE — Assessment & Plan Note (Signed)
Blood pressure up today.  We will have him check it at home over the weekend and then he is following up with our clinical pharmacist on Monday.  We will see what it is then prior to making any changes.

## 2018-03-21 NOTE — Patient Instructions (Signed)
Nice to see you. Please increase your NovoLog to 10 units in the morning and 10 units at night.  Please continue your Tresiba 50 units daily.  We will have you see the pharmacist on Monday. Please check your blood pressure daily over the weekend and then we will see what it is when you see the pharmacist on Monday. We will let you know what dose of Zantac you could take once your kidney function returns. Please consider having the procedure done for your legs with vascular surgery.

## 2018-03-21 NOTE — Assessment & Plan Note (Signed)
Symptoms seem consistent with reflux.  We will check renal function and then determine what dose of Zantac to start him on.

## 2018-03-21 NOTE — Progress Notes (Signed)
Danny AlarEric Hilda Wexler, MD Phone: [phone]  Danny Friendsobert M Zerkle Sr. is a 67 y.o. male who presents today for f/u.  CC: Diabetes, hypertension, reflux, PAD  DIABETES Disease Monitoring: Blood Sugar ranges-95-190 Polyuria/phagia/dipsia- no      Optho- patient to call to schedule Medications: Compliance- taking trulicity, jardiance, novolog 5 u am and 10 pm with meals, tresiba 50 u daily Hypoglycemic symptoms- no  HYPERTENSION  Disease Monitoring  Home BP Monitoring not checking consistently Chest pain- no    Dyspnea- no Medications  Compliance-  Taking lasix, lisinopril, imdur.  Edema- minimal since coming down on lasix dose. Patient saw his cardiologist and his blood pressure was low.  They had him hold his Lasix as it looked like he was prerenal on lab work and then resume it at a lower dose.  He is currently taking 20 mg daily.  He is trying to stay hydrated.  GERD: Patient notes 2-3 times recently he has woken up in the middle the night with a burning sensation in his lower chest.  He has had some regurgitation and sour taste with this.  No chest pressure.  No shortness of breath.  No blood in his stool.  No abdominal pain.  He does not take any medication for this.  Notes previously he was taking Zantac.  PAD: He did not follow through with the procedure for this.  He notes no claudication.  He is unsure when he is going to have this done as he has to take care of his mother.     Social History   Tobacco Use  Smoking Status Former Smoker  . Packs/day: 3.00  . Years: 0.00  . Pack years: 0.00  . Types: Cigarettes  . Last attempt to quit: 09/07/2009  . Years since quitting: 8.5  Smokeless Tobacco Never Used  Tobacco Comment   quit june 2011     ROS see history of present illness  Objective  Physical Exam Vitals:   03/21/18 0855  BP: (!) 152/90  Pulse: 94  Temp: 97.9 F (36.6 C)  SpO2: 94%    BP Readings from Last 3 Encounters:  03/21/18 (!) 152/90  03/20/18 138/78    02/19/18 (!) 83/61   Wt Readings from Last 3 Encounters:  03/21/18 242 lb 3.2 oz (109.9 kg)  03/20/18 242 lb (109.8 kg)  02/19/18 240 lb 12 oz (109.2 kg)    Physical Exam  Constitutional: No distress.  Cardiovascular: Normal rate, regular rhythm and normal heart sounds.  Pulmonary/Chest: Effort normal and breath sounds normal.  Abdominal: Soft. Bowel sounds are normal. He exhibits no distension. There is no tenderness.  Musculoskeletal: He exhibits no edema.  Bilateral feet with no ulcerations or calluses  Neurological: He is alert.  Skin: Skin is warm and dry. He is not diaphoretic.     Assessment/Plan: Please see individual problem list.  Essential hypertension Blood pressure up today.  We will have him check it at home over the weekend and then he is following up with our clinical pharmacist on Monday.  We will see what it is then prior to making any changes.  GERD (gastroesophageal reflux disease) Symptoms seem consistent with reflux.  We will check renal function and then determine what dose of Zantac to start him on.  Diabetes mellitus type 2, uncontrolled, with complications (HCC) Not controlled.  It appears he did not increase his NovoLog as previously advised by our clinical pharmacist.  We will have him increase to 10 units in the morning  and he will continue 10 units of NovoLog at night.  He will continue his other regimen.  Could consider increasing further once he sees clinical pharmacy on Monday.  Atherosclerosis of native arteries of extremity with intermittent claudication [facility](HCC) Patient notes no claudication symptoms at this time.  I reinforced that he should see vascular surgery to have angiography as his vascular disease potentially could progress at some point leading to further issues with his lower extremities.  He noted he would do this when he is able.    Orders Placed This Encounter  Procedures  . Basic Metabolic Panel (BMET)    No orders of the  defined types were placed in this encounter.    Danny AlarEric Raphael Fitzpatrick, MD [facility]

## 2018-03-24 ENCOUNTER — Encounter: Payer: Self-pay | Admitting: Pharmacist

## 2018-03-24 ENCOUNTER — Ambulatory Visit (INDEPENDENT_AMBULATORY_CARE_PROVIDER_SITE_OTHER): Payer: Medicare Other | Admitting: Pharmacist

## 2018-03-24 DIAGNOSIS — I10 Essential (primary) hypertension: Secondary | ICD-10-CM

## 2018-03-24 DIAGNOSIS — E118 Type 2 diabetes mellitus with unspecified complications: Secondary | ICD-10-CM

## 2018-03-24 DIAGNOSIS — IMO0002 Reserved for concepts with insufficient information to code with codable children: Secondary | ICD-10-CM

## 2018-03-24 DIAGNOSIS — E1165 Type 2 diabetes mellitus with hyperglycemia: Secondary | ICD-10-CM | POA: Diagnosis not present

## 2018-03-24 MED ORDER — METFORMIN HCL ER 500 MG PO TB24: 500.0000 mg | ORAL_TABLET | Freq: Two times a day (BID) | ORAL | 3 refills | Status: DC

## 2018-03-24 NOTE — Progress Notes (Signed)
S:     Chief Complaint  Patient presents with  . Medication Management    Diabetes and Hypertension    Patient arrives in good spirits, ambulating without assistance.  Presents for diabetes evaluation, education, and management at the request of Dr. Birdie SonsSonnenberg (referred on 03/21/2018) at last appointment. At that time, Dr. Birdie SonsSonnenberg encouraged adherence to Novolog 10 units BID. BP was elevated at that appointment; Dr. Birdie SonsSonnenberg recommended that patient check BP at home over the weekend. Today, he states that his home BP meter is broken so he did not check. He notes that he had not taken his antihypertensives prior to that appointment.  Patient is somewhat able to verbalize his medication regimen, and states that he does use a weekly pill box to help organize his medications. He states that his sugars have shown great improvement in the past few weeks, and is pleased with his progress. Upon medication review, he notes that he remembers being on metformin in the past, but cannot remember any specific reasons it was discontinued (GI upset/intolerance, etc).  He does note some diarrhea this morning.   Insurance coverage/medication affordability: UHC Medicare and Medicaid   Patient reports adherence with medications, though does not initially recognize many medication names. Current diabetes medications include: Tresiba 50 units daily, Novolog 10 units BID, Trulicity 1.5 mg daily, and Jardiance 25 mg daily. Current hypertension medications include: carvedilol 12.5 mg BID, lisinopril 2.5 mg daily  Patient denies hypoglycemic events.  Patient reported dietary habits: Eats 2 meals/day  O:  Physical Exam  Constitutional: He appears well-developed and well-nourished.  Vitals reviewed.  Review of Systems  All other systems reviewed and are negative.  Lab Results  Component Value Date   HGBA1C 9.0 (H) 03/20/2018   Vitals:   03/24/18 1119  BP: 109/77  Pulse: 95  SpO2: 95%     Lipid Panel     Component Value Date/Time   CHOL 92 03/05/2017 1520   TRIG 78.0 03/05/2017 1520   HDL 42.10 03/05/2017 1520   CHOLHDL 2 03/05/2017 1520   VLDL 15.6 03/05/2017 1520   LDLCALC 35 03/05/2017 1520   LDLDIRECT 64.0 03/20/2018 0950    Home fasting CBG: 150-170s, highest 225 (notes a larger supper the evening before)  Clinical ASCVD: Yes  ASCVD risk factors : age 67-75  A/P:  Following discussion and approval by Dr. Birdie SonsSonnenberg, the following medication changes were made:   Diabetes longstanding currently uncontrolled. Patient is able to verbalize appropriate hypoglycemia management plan. Patient appears to be  adherent with medication. Control is suboptimal due to diet, obesity. Renal function is currently appropriate for metformin initiation.  -Start metformin XR 500 mg BID with plans to titrate as tolerated. Counseled on side effects. Recommended he wait until current diarrhea has resolved prior to starting metformin, and to start with one 500 mg tablet after supper x1 week, then increase to BID. -Extensively discussed pathophysiology of DM, recommended lifestyle interventions, dietary effects on glycemic control -Continue Tresiba 50 units, Novolog 10 units BID, Trulicity 1.5 mg weekly, Jardiance 25 mg daily -Counseled on s/sx of and management of hypoglycemia -Next A1C anticipated 06/2018  ASCVD risk - secondary prevention in patient with DM. Last LDL is controlled. ASCVD risk score is >20%  - high intensity statin indicated. Aspirin is indicated.  -Continued aspirin 81 mg  -Continued atorvastatin 80 mg.   Hypertension longstanding currently controlled.  BP goal = 130/80 mmHg. Patient is adherent with medication. Previous BP elevation likely related to  not having taken his medications prior to PCP appointment. -Continue lisinopril 2.5 mg daily and carvedilol 12.5 mg BID  Written patient instructions provided.  Total time in face to face counseling 30 minutes.    Follow up Pharmacist Clinic Visit in 4 weeks.   Catie Feliz Beamravis, PharmD PGY2 Ambulatory Care Pharmacy Resident Phone: [phone]

## 2018-03-24 NOTE — Patient Instructions (Signed)
It was great to meet you today!   Today, we are going to restart metformin for diabetes. Start with 500 mg once daily with a meal. After 1 week, increase to 500 mg twice daily. Make sure you take this with food on your stomach.   Continue Tresiba 50 units daily, Novolog 10 units twice daily, Trulicity 1.5 mg weekly, and Jardiance 25 mg daily.    Next time you come see me, please bring all of your medication bottles and your weekly pill box.   Schedule follow up with me in 4 weeks.

## 2018-03-24 NOTE — Assessment & Plan Note (Signed)
Diabetes longstanding currently uncontrolled. Patient is able to verbalize appropriate hypoglycemia management plan. Patient appears to be  adherent with medication. Control is suboptimal due to diet, obesity. Renal function is currently appropriate for metformin initiation.  -Start metformin XR 500 mg BID with plans to titrate as tolerated. Counseled on side effects. Recommended he wait until current diarrhea has resolved prior to starting metformin, and to start with one 500 mg tablet after supper x1 week, then increase to BID. -Extensively discussed pathophysiology of DM, recommended lifestyle interventions, dietary effects on glycemic control -Continue Tresiba 50 units, Novolog 10 units BID, Trulicity 1.5 mg weekly, Jardiance 25 mg daily -Counseled on s/sx of and management of hypoglycemia -Next A1C anticipated 06/2018

## 2018-03-24 NOTE — Assessment & Plan Note (Signed)
Hypertension longstanding currently controlled.  BP goal = 130/80 mmHg. Patient is adherent with medication. Previous BP elevation likely related to not having taken his medications prior to PCP appointment. -Continue lisinopril 2.5 mg daily and carvedilol 12.5 mg BID

## 2018-03-25 NOTE — Progress Notes (Signed)
I have reviewed the above note and agree. I saw the patient with clinical pharmacist.  He is noted diarrhea just the day of the visit.  He notes no abdominal pain or blood in his stool.  No nausea or vomiting.  He had no tenderness or distention on exam.  Normal bowel sounds.  He will monitor his diarrhea and if not improving let us know.  Danny AlarEric Evoleht Hovatter, MD

## 2018-03-28 ENCOUNTER — Other Ambulatory Visit: Payer: Self-pay | Admitting: Family Medicine

## 2018-03-28 DIAGNOSIS — N183 Chronic kidney disease, stage 3 unspecified: Secondary | ICD-10-CM

## 2018-04-09 ENCOUNTER — Other Ambulatory Visit: Payer: Self-pay | Admitting: *Deleted

## 2018-04-09 NOTE — Telephone Encounter (Signed)
Okay to refill Lexapro? It was removed from med list in January.

## 2018-04-10 NOTE — Telephone Encounter (Signed)
Please contact the patient and see why this was requested.  He was off medication in January when I saw him and was doing well.  Please see if he has been taking this medication.  If he has had recurrence of symptoms he should be evaluated.  Thanks.

## 2018-04-21 ENCOUNTER — Ambulatory Visit (INDEPENDENT_AMBULATORY_CARE_PROVIDER_SITE_OTHER): Payer: Medicare Other | Admitting: Pharmacist

## 2018-04-21 ENCOUNTER — Encounter: Payer: Self-pay | Admitting: Pharmacist

## 2018-04-21 DIAGNOSIS — E1165 Type 2 diabetes mellitus with hyperglycemia: Secondary | ICD-10-CM | POA: Diagnosis not present

## 2018-04-21 DIAGNOSIS — I10 Essential (primary) hypertension: Secondary | ICD-10-CM

## 2018-04-21 DIAGNOSIS — E118 Type 2 diabetes mellitus with unspecified complications: Secondary | ICD-10-CM | POA: Diagnosis not present

## 2018-04-21 DIAGNOSIS — IMO0002 Reserved for concepts with insufficient information to code with codable children: Secondary | ICD-10-CM

## 2018-04-21 MED ORDER — METFORMIN HCL ER 500 MG PO TB24: 1000.0000 mg | ORAL_TABLET | Freq: Two times a day (BID) | ORAL | 3 refills | Status: DC

## 2018-04-21 MED ORDER — INSULIN DEGLUDEC 100 UNIT/ML ~~LOC~~ SOPN: PEN_INJECTOR | SUBCUTANEOUS | 1 refills | Status: DC

## 2018-04-21 MED ORDER — INSULIN ASPART 100 UNIT/ML FLEXPEN: PEN_INJECTOR | SUBCUTANEOUS | 2 refills | Status: DC

## 2018-04-21 NOTE — Assessment & Plan Note (Signed)
-  Currently uncontrolled, most recent A1c 9.0% on 03/20/2018, improved from 9.5% on 01/06/2018; Goal A1c <7%; Patient reports symptomatic hypoglycemia at SMBG <90, though this is likely due to being accustomed to higher blood sugars. Patient reports adherence with medication. - To maximize metformin benefit, increasing to maximal dose of 2000 mg daily. Renal function sufficient (eGFR >45 mL/min/1.94m) - Decrease Tresiba from 50 to 45 units daily.  - Continue Novolog 10 units twice daily. Encouraged patient to check a few 2 hour post prandial SMBG results to evaluate effect of Novolog on mealtime SMBG  - Continue Jardiance 25 mg daily and Trulicity 1.5 mg weekly - Counseled on treatment of symptomatic hypoglycemia vs hypoglycemia with SMBG <70  - Next A1C anticipated 06/2018

## 2018-04-21 NOTE — Assessment & Plan Note (Signed)
Currently uncontrolled, though fluctuates between controlled and uncontrolled at different office visits. Goal BP <130/80. Patient reports adherence with medication. Could be related to fluid status, as patient is taking furosemide every other day (eg fluid/weight and BP up the days after a furosemide off day) - Continue to monitor BP at this time. Continue carvedilol 12.5 mg BID, lisinopril 2.5 mg daily, furosemide 20 mg QOD.  - Encouraged patient to check SMBG at home

## 2018-04-21 NOTE — Progress Notes (Addendum)
  S:     Chief Complaint  Patient presents with  . Medication Management    Diabetes    Patient arrives in good spirits, ambulating without assistance.  Presents for diabetes evaluation, education, and management at the request of Dr. Sonnenberg (referred on 03/21/2018 at last appointment). Last Rx Clinic visit on 03/24/2018 - at that time, metformin XR was initiated. He had some concerns of diarrhea at that appointment, but today he notes that he realized the GI upset was due to a certain sausage brand he ate. Since then, he has had no problems with GI upset, and is now taking metformin XR 500 mg BID. He notes improvement in SMBG results, even seeing a reading of 86. He did experience s/sx hypoglycemia at that time, and ate a piece of candy. He also notes one reading one morning of 486, but is unsure if he washed his hands well before checking that day.   Today, he brings all of his medications for review. His box does not have the atorvastatin bottle in it, but he notes that he did leave an empty bottle at home that needed to be refilled. He believes that was the atorvastatin.  Insurance coverage/medication affordability: UHC Medicare and Medicaid  Patient reports adherence with medications.  Current diabetes medications include: metformin XR 500 mg BID, Trulicity 1.5 mg weekly, Jardiance 25 mg daily Current hypertension medications include: carvedilol 12.5 mg BID, lisinopril 2.5 mg daily  Patient reports hypoglycemic s/sx including dizziness, shakiness, sweating (SMBG 86). Patient denies hyperglycemic symptoms including polyuria, polydipsia, polyphagia, nocturia, neuropathy, blurred vision. In fact, notes improvement in polyuria. Patient denies self foot exams.   O:  Physical Exam  Constitutional: He appears well-developed and well-nourished.  Vitals reviewed.  Review of Systems  All other systems reviewed and are negative.   Lab Results  Component Value Date   HGBA1C 9.0 (H)  03/20/2018   Vitals:   04/21/18 1114  BP: (!) 146/90  Pulse: 99    Basic Metabolic Panel BMP Latest Ref Rng & Units 03/21/2018 01/20/2018 11/02/2017  Glucose 70 - 99 mg/dL 308(H) 496(H) 243(H)  BUN 6 - 23 mg/dL 30(H) 48(H) 33(H)  Creatinine 0.40 - 1.50 mg/dL 1.41 1.51(H) 1.49(H)  Sodium 135 - 145 mEq/L 136 135 141  Potassium 3.5 - 5.1 mEq/L 4.9 4.4 3.5  Chloride 96 - 112 mEq/L 102 102 106  CO2 19 - 32 mEq/L 26 25 22  Calcium 8.4 - 10.5 mg/dL 9.5 9.1 9.0   eGFR 53.26 mL/min/1.73m2  Lipid Panel     Component Value Date/Time   CHOL 92 03/05/2017 1520   TRIG 78.0 03/05/2017 1520   HDL 42.10 03/05/2017 1520   CHOLHDL 2 03/05/2017 1520   VLDL 15.6 03/05/2017 1520   LDLCALC 35 03/05/2017 1520   LDLDIRECT 64.0 03/20/2018 0950    Fasting SMBG: 148 this AM; 86- 178, but more readings <150 than >150 per patient report  Clinical ASCVD: Yes ;  High-risk conditions: Age 65 or older  A/P: Following discussion and approval by Dr. Tullo, the following medication changes were made:   #Diabetes - Currently uncontrolled, most recent A1c 9.0% on 03/20/2018, improved from 9.5% on 01/06/2018; Goal A1c <7%; Patient reports symptomatic hypoglycemia at SMBG <90, though this is likely due to being accustomed to higher blood sugars. Patient reports adherence with medication. - To maximize metformin benefit, increasing to maximal dose of 2000 mg daily. Renal function sufficient (eGFR >45 mL/min/1.73m2) - Decrease Tresiba from 50 to 45   units daily.  - Continue Novolog 10 units twice daily. Encouraged patient to check a few 2 hour post prandial SMBG results to evaluate effect of Novolog on mealtime SMBG  - Continue Jardiance 25 mg daily and Trulicity 1.5 mg weekly - Counseled on treatment of symptomatic hypoglycemia vs hypoglycemia with SMBG <70  - Next A1C anticipated 06/2018  #Hypertension currently uncontrolled, though fluctuates between controlled and uncontrolled at different office visits.Goal BP  <130/80. Patient reports adherence with medication. Could be related to fluid status, as patient is taking furosemide every other day (eg fluid/weight and BP up the days after a furosemide off day) - Continue to monitor BP at this time. Continue carvedilol 12.5 mg BID, lisinopril 2.5 mg daily, furosemide 20 mg QOD.  - Encouraged patient to check SMBG at home  #ASCVD risk - secondary prevention in patient with DM. Last LDL is controlled. ASCVD risk score is >20%  - High intensity statin indicated. Aspirin is indicated.  -Continued aspirin 81 mg  -Continued atorvastatin 80 mg.    Written patient instructions provided.  Total time in face to face counseling 30 minutes.    Follow up in Pharmacist Clinic Visit 4 weeks.   Catherine E Travis, PharmD PGY2 Ambulatory Care Pharmacy Resident Phone: [phone]   I have reviewed the above information and agree with above.   Teresa Tullo, MD  

## 2018-04-21 NOTE — Patient Instructions (Addendum)
It was great to see you today! Great work on the diabetes control!  We are going to make some changes:   1) Increase metformin XR to 2000 mg daily (4 pills in total). You can start by increasing to 1 pill with breakfast, 2 pills with supper for about a week, then increase to 2 pills twice daily. Once we are sure you tolerate this, we can change to the 1000 mg tablets.   2) Decrease Tresiba from 50 to 45 UNITS DAILY.   Keep checking your blood sugars every morning, before you eat breakfast. Please also add a check 2 hours after the largest meal of your day.   Please bring your blood sugar meter to our next appointment.      Schedule follow up with me in about 4 weeks.

## 2018-04-24 NOTE — Telephone Encounter (Signed)
Please see my previous message and contact the patient.  Thanks.

## 2018-04-25 ENCOUNTER — Encounter: Payer: Self-pay | Admitting: Family Medicine

## 2018-04-25 DIAGNOSIS — E119 Type 2 diabetes mellitus without complications: Secondary | ICD-10-CM | POA: Diagnosis not present

## 2018-04-25 LAB — HM DIABETES EYE EXAM

## 2018-05-01 ENCOUNTER — Other Ambulatory Visit: Payer: Self-pay | Admitting: Family Medicine

## 2018-05-01 NOTE — Telephone Encounter (Signed)
Last OV 03/21/2018   Last refilled 03/17/2018   Sent to PCP to advise

## 2018-05-05 ENCOUNTER — Other Ambulatory Visit: Payer: Self-pay | Admitting: Family Medicine

## 2018-05-05 DIAGNOSIS — IMO0002 Reserved for concepts with insufficient information to code with codable children: Secondary | ICD-10-CM

## 2018-05-05 DIAGNOSIS — E118 Type 2 diabetes mellitus with unspecified complications: Principal | ICD-10-CM

## 2018-05-05 DIAGNOSIS — E1165 Type 2 diabetes mellitus with hyperglycemia: Secondary | ICD-10-CM

## 2018-05-08 NOTE — Telephone Encounter (Signed)
Last OV 03/21/2018   Last refilled

## 2018-05-13 ENCOUNTER — Other Ambulatory Visit: Payer: Self-pay | Admitting: Cardiovascular Disease

## 2018-05-13 NOTE — Telephone Encounter (Signed)
°*  STAT* If patient is at the pharmacy, call can be transferred to refill team.   1. Which medications need to be refilled? (please list name of each medication and dose if known) nitrostat  1.4 mg sl q 5 min prn   2. Which pharmacy/location (including street and city if local pharmacy) is medication to be sent to? [facility]   3. Do they need a 30 day or 90 day supply? 90

## 2018-05-20 ENCOUNTER — Other Ambulatory Visit: Payer: Self-pay | Admitting: Family Medicine

## 2018-05-20 DIAGNOSIS — E118 Type 2 diabetes mellitus with unspecified complications: Principal | ICD-10-CM

## 2018-05-20 DIAGNOSIS — E1165 Type 2 diabetes mellitus with hyperglycemia: Secondary | ICD-10-CM

## 2018-05-20 DIAGNOSIS — IMO0002 Reserved for concepts with insufficient information to code with codable children: Secondary | ICD-10-CM

## 2018-05-21 NOTE — Telephone Encounter (Signed)
Sent to PCP for approval   Last OV 03/24/2018  Last refilled 04/21/2018 disp 15 ml with 1 refill

## 2018-05-22 ENCOUNTER — Emergency Department
Admission: EM | Admit: 2018-05-22 | Discharge: 2018-05-22 | Disposition: A | Discharge status: Home / Self Care | Payer: Medicare Other | Attending: Emergency Medicine | Admitting: Emergency Medicine

## 2018-05-22 ENCOUNTER — Encounter: Payer: Self-pay | Admitting: Emergency Medicine

## 2018-05-22 DIAGNOSIS — I2510 Atherosclerotic heart disease of native coronary artery without angina pectoris: Secondary | ICD-10-CM | POA: Diagnosis not present

## 2018-05-22 DIAGNOSIS — G8929 Other chronic pain: Secondary | ICD-10-CM | POA: Diagnosis not present

## 2018-05-22 DIAGNOSIS — M545 Low back pain, unspecified: Secondary | ICD-10-CM

## 2018-05-22 DIAGNOSIS — Z794 Long term (current) use of insulin: Secondary | ICD-10-CM | POA: Insufficient documentation

## 2018-05-22 DIAGNOSIS — Z955 Presence of coronary angioplasty implant and graft: Secondary | ICD-10-CM | POA: Insufficient documentation

## 2018-05-22 DIAGNOSIS — J449 Chronic obstructive pulmonary disease, unspecified: Secondary | ICD-10-CM | POA: Insufficient documentation

## 2018-05-22 DIAGNOSIS — I5042 Chronic combined systolic (congestive) and diastolic (congestive) heart failure: Secondary | ICD-10-CM | POA: Insufficient documentation

## 2018-05-22 DIAGNOSIS — Z7902 Long term (current) use of antithrombotics/antiplatelets: Secondary | ICD-10-CM | POA: Diagnosis not present

## 2018-05-22 DIAGNOSIS — Z7982 Long term (current) use of aspirin: Secondary | ICD-10-CM | POA: Insufficient documentation

## 2018-05-22 DIAGNOSIS — I130 Hypertensive heart and chronic kidney disease with heart failure and stage 1 through stage 4 chronic kidney disease, or unspecified chronic kidney disease: Secondary | ICD-10-CM | POA: Diagnosis not present

## 2018-05-22 DIAGNOSIS — N183 Chronic kidney disease, stage 3 (moderate): Secondary | ICD-10-CM | POA: Insufficient documentation

## 2018-05-22 DIAGNOSIS — M5136 Other intervertebral disc degeneration, lumbar region: Secondary | ICD-10-CM | POA: Diagnosis not present

## 2018-05-22 DIAGNOSIS — Z87891 Personal history of nicotine dependence: Secondary | ICD-10-CM | POA: Diagnosis not present

## 2018-05-22 MED ORDER — TRAMADOL HCL 50 MG PO TABS
50.0000 mg | ORAL_TABLET | Freq: Once | ORAL | Status: AC
  Administered 2018-05-22: 50 mg via ORAL
  Filled 2018-05-22: qty 1

## 2018-05-22 MED ORDER — TRAMADOL HCL 50 MG PO TABS: 50.0000 mg | ORAL_TABLET | Freq: Two times a day (BID) | ORAL | 0 refills | Status: AC

## 2018-05-22 MED ORDER — CYCLOBENZAPRINE HCL 5 MG PO TABS: 5.0000 mg | ORAL_TABLET | Freq: Three times a day (TID) | ORAL | 0 refills | Status: DC | PRN

## 2018-05-22 NOTE — ED Triage Notes (Signed)
Pt arrived with complaints of left and right lower back pain. Pt states he has chronic lower back pain and denies any new injury. Pt states the pain is just worse today.

## 2018-05-22 NOTE — ED Notes (Signed)
Pt to waiting room to await ride - signature pad not crossing over

## 2018-05-22 NOTE — Discharge Instructions (Signed)
Your exam is consistent with a flare of your chronic low back pain. Take the prescription meds as needed for pain and spasms. Follow-up with Dr. De Nurse for ongoing symptoms.

## 2018-05-22 NOTE — ED Provider Notes (Signed)
[facility] Emergency Department Provider Note ____________________________________________  Time seen: 1610  I have reviewed the triage vital signs and the nursing notes.  HISTORY  Chief Complaint  Back Pain  HPI Danny KERCE Sr. is a 67 y.o. male who presents to the ED via EMS from home, for evaluation of bilateral low back pain.  Patient gives a history of chronic low back pain denies any current intervention or particular medications.  He denies any recent injury, accident, trauma, or fall.  He describes pain is worsened today with an unknown etiology.  He denies any distal paresthesias, saddle anesthesia, footdrop, dysuria, or bladder or bowel incontinence.  He does give a history of lower extremity diabetic neuropathy and some peripheral vascular disease.  He describes this as a flare of his typical back pain, he is requesting a muscle relaxant and pain relief at this time.  Past Medical History:  Diagnosis Date  . CAD (coronary artery disease)    a. 01/2010 PCI of LAD; b. 09/2014 PCI/DES of mLAD due to ISR. D1 80, D2 80(jailed), LCX 2m c. 07/2016 NSTEMI/Cath: LM nl, LAD 24mSR, 40d, D1 90ost, 80p, D2 90ost, RI min irregs, LCX min irregs, OM1 90 small, OM2/3 min irregs, RCA min irregs, RPLB1 90, EF 25-35%.  . Chronic combined systolic and diastolic CHF (congestive heart failure) (HCStorla   a. 07/2016 Echo: EF 30%, severe septal/anterior HK, Gr1 DD.  . Marland KitchenKD (chronic kidney disease), stage III (HCClallam  . COPD (chronic obstructive pulmonary disease) (HCOak [place]  . DM2 (diabetes mellitus, type 2) (HCEast [place]  . Erectile dysfunction   . HLD (hyperlipidemia)   . HTN (hypertension)   . Ischemic cardiomyopathy    a. 07/2016 Echo: EF 30% w/ sev septal/ant HK. Gr1 DD.    Patient Active Problem List   Diagnosis Date Noted  . Atherosclerosis of native arteries of extremity with intermittent claudication (HCFountain07/04/2018  . Decreased pedal pulses 07/02/2017  . Orthostasis  05/07/2017  . Anxiety and depression 05/07/2017  . CKD (chronic kidney disease) stage 3, GFR 30-59 ml/min (HCC) 03/06/2017  . Diabetic neuropathy (HCSanta Rosa Valley08/08/2016  . Chronic combined systolic and diastolic CHF (congestive heart failure) (HCTrooper07/31/2018  . Morbid obesity (HCGettysburg11/13/2017  . H/O medication noncompliance 06/18/2016  . COPD (chronic obstructive pulmonary disease) (HCPella11/14/2016  . GERD (gastroesophageal reflux disease) 12/20/2014  . Diabetes mellitus type 2, uncontrolled, with complications (HCFairhope1017/00/1749. Hyperlipidemia 02/10/2010  . Essential hypertension 02/10/2010  . CAD (coronary artery disease) 02/10/2010    Past Surgical History:  Procedure Laterality Date  . CAD: stent to the LAD    . CARDIAC CATHETERIZATION  10/01/2014  . CARDIAC CATHETERIZATION N/A 07/23/2016   Procedure: Left Heart Cath and Coronary Angiography;  Surgeon: MuWellington HampshireMD;  [facility];  Service: Cardiovascular;  Laterality: N/A;  . CORONARY ANGIOPLASTY WITH STENT PLACEMENT  10/01/2014    Prior to Admission medications   Medication Sig Start Date End Date Taking? Authorizing Provider  acetaminophen (TYLENOL) 325 MG tablet Take 650 mg by mouth every 6 (six) hours as needed.    [provider]  aspirin 81 MG tablet Take 81 mg by mouth daily.      [provider]  atorvastatin (LIPITOR) 80 MG tablet Take 1 tablet (80 mg total) by mouth daily. 12/19/17   SoLeone HavenMD  blood glucose meter kit and supplies KIT Dispense based on patient and insurance preference.  Check CBGs 4 times daily. Dx code E11.9. 12/19/17   [name], MD  budesonide-formoterol [employer]) 160-4.5 MCG/ACT inhaler Inhale 2 puffs into the lungs 2 (two) times daily. 01/20/18   [name], MD  carvedilol (COREG) 12.5 MG tablet Take 1 tablet (12.5 mg total) by mouth 2 (two) times daily with a meal. 12/19/17   [name], MD  clonazePAM (KLONOPIN) 0.5 MG  tablet Take 0.5 tablets (0.25 mg total) by mouth 2 (two) times daily as needed for anxiety. Patient not taking: Reported on 03/24/2018 07/02/17   [name], MD  clopidogrel (PLAVIX) 75 MG tablet Take 1 tablet (75 mg total) by mouth daily. 12/19/17   [name], MD  CONTOUR NEXT TEST test strip TEST TWICE DAILY 01/24/18   [name], MD  cyclobenzaprine (FLEXERIL) 5 MG tablet Take 1 tablet (5 mg total) by mouth 3 (three) times daily as needed for muscle spasms. 05/22/18   Carl Bleecker, Dannielle Karvonen, PA-C  Dulaglutide (TRULICITY) 1.5 JQ/7.3AL SOPN Inject 1.5 mg into the skin once a week. 12/19/17   [name], MD  empagliflozin (JARDIANCE) 25 MG TABS tablet Take 25 mg by mouth daily. 12/19/17   [name], MD  escitalopram (LEXAPRO) 5 MG tablet Take 5 mg by mouth daily. 04/14/18   [provider]  furosemide (LASIX) 20 MG tablet Take 1 tablet (20 mg total) by mouth every other day. 02/19/18   [name], MD  HYDROcodone-acetaminophen (NORCO/VICODIN) 5-325 MG tablet Take 1 tablet by mouth every 4 (four) hours as needed for moderate pain. 01/12/18   Cuthriell, Charline Bills, PA-C  insulin aspart (NOVOLOG FLEXPEN) 100 UNIT/ML FlexPen Inject 8 units under the skin twice daily with meals 04/21/18   Crecencio Mc, MD  insulin degludec (TRESIBA FLEXTOUCH) 100 UNIT/ML SOPN FlexTouch Pen Inject 0.45 mLs (45 Units total) into the skin daily. 05/21/18   [name], MD  Insulin Pen Needle (PEN NEEDLES) 32G X 6 MM MISC 1 each by Does not apply route 2 (two) times daily. 07/22/17   [name], MD  isosorbide mononitrate (IMDUR) 30 MG 24 hr tablet Take 1 tablet (30 mg total) by mouth daily. 09/26/17   [name], MD  Lancets Misc. (ACCU-CHEK SOFTCLIX LANCET DEV) KIT Dispense one lancing device to test blood glucose four times daily. Dx code E11.9 01/06/18   [name], MD  lisinopril (PRINIVIL,ZESTRIL) 2.5 MG tablet Take 1 tablet (2.5 mg total) by  mouth daily. 12/19/17   [name], MD  metFORMIN (GLUCOPHAGE XR) 500 MG 24 hr tablet Take 2 tablets (1,000 mg total) by mouth 2 (two) times daily. 04/21/18   Crecencio Mc, MD  nitroGLYCERIN (NITROSTAT) 0.4 MG SL tablet Place 0.4 mg under the tongue every 5 (five) minutes as needed for chest pain.     [provider]  potassium chloride (MICRO-K) 10 MEQ CR capsule Take 1 capsule (10 mEq total) by mouth daily. Take with lasix 12/19/17   [name], MD  pregabalin (LYRICA) 50 MG capsule TAKE ONE CAPSULE BY MOUTH TWICE A DAY 05/01/18   [name], MD  tamsulosin (FLOMAX) 0.4 MG CAPS capsule Take 0.4 mg by mouth daily.    [provider]  traMADol (ULTRAM) 50 MG tablet Take 1 tablet (50 mg total) by mouth 2 (two) times daily for 5 days. 05/22/18 05/27/18  Keylee Shrestha, Dannielle Karvonen, PA-C  VENTOLIN HFA 108 (90 Base) MCG/ACT  inhaler Inhale 2 puffs into the lungs every 6 (six) hours as needed for wheezing or shortness of breath. 01/20/18   [name], MD    Allergies Patient has no known allergies.  Family History  Problem Relation Age of Onset  . Heart attack Father        complications  . Cancer Brother     Social History Social History   Tobacco Use  . Smoking status: Former Smoker    Packs/day: 3.00    Years: 0.00    Pack years: 0.00    Types: Cigarettes    Last attempt to quit: 09/07/2009    Years since quitting: 8.7  . Smokeless tobacco: Never Used  . Tobacco comment: quit june 2011  Substance Use Topics  . Alcohol use: Yes    Comment: every other weekend  . Drug use: No    Review of Systems  Constitutional: Negative for fever. Cardiovascular: Negative for chest pain. Respiratory: Negative for shortness of breath. Gastrointestinal: Negative for abdominal pain, vomiting and diarrhea. Genitourinary: Negative for dysuria. Musculoskeletal: Positive for back pain. Skin: Negative for rash. Neurological: Negative for headaches, focal  weakness or numbness. ____________________________________________  PHYSICAL EXAM:  VITAL SIGNS: ED Triage Vitals  Enc Vitals Group     BP 05/22/18 1540 (!) 159/94     Pulse Rate 05/22/18 1539 99     Resp 05/22/18 1539 18     Temp 05/22/18 1539 98 F (36.7 C)     Temp Source 05/22/18 1539 Oral     SpO2 05/22/18 1539 94 %     Weight 05/22/18 1540 234 lb (106.1 kg)     Height 05/22/18 1540 _0  (1.803 m)     Head Circumference --      Peak Flow --      Pain Score 05/22/18 1539 10     Pain Loc --      Pain Edu? --      Excl. in [place]? --     Constitutional: Alert and oriented. Well appearing and in no distress. Head: Normocephalic and atraumatic. Eyes: Conjunctivae are normal. Normal extraocular movements Neck: Supple. Normal ROM Cardiovascular: Normal rate, regular rhythm. Normal distal pulses. Respiratory: Normal respiratory effort. No wheezes/rales/rhonchi. Gastrointestinal: Soft and nontender. No distention. Normal bowel sounds Musculoskeletal: Nontender with normal range of motion in all extremities.  Neurologic:  CN II-XII grossly intact. Normal speech and language. No gross focal neurologic deficits are appreciated. Skin:  Skin is warm, dry and intact. No rash noted. ____________________________________________  PROCEDURES  Procedures Ultram 50 mg PO ____________________________________________  INITIAL IMPRESSION / ASSESSMENT AND PLAN / ED COURSE  Patient with ED evaluation of acute on chronic flare of his low back pain.  Patient exam is overall benign at this time.  No preceding injury or accident has caused this recent flare.  He describes pain is not relieved by his typical Tylenol dosage.  He is presenting for pain management.  Patient's exam is reassuring as it shows no acute neuromuscular deficit.  Will be discharged with a prescription for Ultram and Flexeril dose as needed he should follow-up with his primary provider for ongoing symptom management.  I  reviewed the patient's prescription history over the last 12 months in the multi-state controlled substances database(s) that includes [place], [place], [place], [place], [place], [place], [place], [place], [place], [place], [place], [place], [place], and [place].  Results were notable for no current prescriptions.  ____________________________________________  FINAL CLINICAL IMPRESSION(S) / ED  DIAGNOSES  Final diagnoses:  Chronic right-sided low back pain without sciatica  DDD (degenerative disc disease), lumbar      Kaily Wragg, Dannielle Karvonen, PA-C 05/22/18 1708    Orbie Pyo, MD 05/22/18 2012

## 2018-05-23 MED ORDER — NITROGLYCERIN 0.4 MG SL SUBL: 0.4000 mg | SUBLINGUAL_TABLET | SUBLINGUAL | 1 refills | Status: DC | PRN

## 2018-05-26 ENCOUNTER — Ambulatory Visit (INDEPENDENT_AMBULATORY_CARE_PROVIDER_SITE_OTHER): Payer: Medicare Other | Admitting: Pharmacist

## 2018-05-26 ENCOUNTER — Encounter: Payer: Self-pay | Admitting: Pharmacist

## 2018-05-26 DIAGNOSIS — I10 Essential (primary) hypertension: Secondary | ICD-10-CM

## 2018-05-26 DIAGNOSIS — E1165 Type 2 diabetes mellitus with hyperglycemia: Secondary | ICD-10-CM

## 2018-05-26 DIAGNOSIS — IMO0002 Reserved for concepts with insufficient information to code with codable children: Secondary | ICD-10-CM

## 2018-05-26 DIAGNOSIS — E118 Type 2 diabetes mellitus with unspecified complications: Secondary | ICD-10-CM

## 2018-05-26 NOTE — Assessment & Plan Note (Signed)
#  Diabetes - Currently uncontrolled, most recent A1c 9.0% on 03/20/2018, improved from 9.5% on 01/06/2018; Goal A1c <7%; Patient believes that fastings <150 were at goal, and did not increase metformin.  - To maximize metformin benefit, increasing to maximal dose of 2000 mg daily. Renal function sufficient (eGFR >45 mL/min/1.78m) - Continue Tresiba 45 units daily, Novolog 8 units with meals, Jardiance 25 mg daily and Trulicity 1.5 mg daily - Encouraged patient to continue to check fastings daily and 2 hour post prandials a few days a week - Next A1C anticipated 06/2018

## 2018-05-26 NOTE — Patient Instructions (Signed)
It was great to see you today!   Increase metformin to 1 tablet with breakfast, 2 tablets with supper for a week. Then, increase to 2 tablets with breakfast and 2 tablets with supper.   Keep checking your blood sugars:  1) Fasting every morning 2) 2 hours after supper a few nights a week.    Continue Tresiba 45 units daily, Novolog 8 units with meals, Trulicity 1.5 mg weekly, and Jardiance 25 mg daily.    Like Dr. [name] said, stay well hydrated and rest. Take your blood pressure medicines! Please take them before you come to the doctor next time.    Schedule follow up with me in ~4 weeks.

## 2018-05-26 NOTE — Progress Notes (Addendum)
S:     Chief Complaint  Patient presents with  . Medication Management    Diabetes    Patient arrives in moderate spirits, ambulating without assistance, indicating that he feels poorly. Presents for diabetes evaluation, education, and management at the request of Dr. [name] (referred on 03/21/2018 at last appointment). Last Rx Clinic visit on 04/21/2018 - at that time, we instructed him to increase metformin to 2000 mg daily. Today, he notes that he did not increase the metformin because he was pleased with his blood sugar results.  Today, he indicates that he has a sore throat and feels warm. Denies nasal or postnasal drainage or coughing. Asks for a flu shot. He also notes significant stress right now with his mother's declining health and progression towards end stage renal disease. Of note, he presented to the ED on 05/22/2018  D/t back pain. He was given cyclobenzaprine and tramadol.   Insurance coverage/medication affordability: [employer] Medicare and Medicaid  Patient reports adherence with medications.  Current diabetes medications include: metformin XR 818 mg BID, Trulicity 1.5 mg weekly, Jardiance 25 mg daily, Tresiba 45 units, Novolog 8 units with meals - occasionally skips supper dose if he isn't eating a lot Current hypertension medications include: carvedilol 12.5 mg BID, lisinopril 2.5 mg daily  Patient denies hypoglycemic s/sx including dizziness, shakiness, sweating. Patient denies hyperglycemic symptoms including polyuria, polydipsia, polyphagia, nocturia, neuropathy, blurred vision.  O:  Physical Exam  Constitutional: He appears well-developed and well-nourished.  Vitals reviewed.  Review of Systems  All other systems reviewed and are negative.   Lab Results  Component Value Date   HGBA1C 9.0 (H) 03/20/2018   Vitals:   05/26/18 0937  BP: (!) 162/115  Pulse: 97    Basic Metabolic Panel BMP Latest Ref Rng & Units 03/21/2018 01/20/2018 11/02/2017  Glucose 70 -  99 mg/dL 308(H) 496(H) 243(H)  BUN 6 - 23 mg/dL 30(H) 48(H) 33(H)  Creatinine 0.40 - 1.50 mg/dL 1.41 1.51(H) 1.49(H)  Sodium 135 - 145 mEq/L 136 135 141  Potassium 3.5 - 5.1 mEq/L 4.9 4.4 3.5  Chloride 96 - 112 mEq/L 102 102 106  CO2 19 - 32 mEq/L _0 Calcium 8.4 - 10.5 mg/dL 9.5 9.1 9.0   eGFR 53.26 mL/min/1.11m  Lipid Panel     Component Value Date/Time   CHOL 92 03/05/2017 1520   TRIG 78.0 03/05/2017 1520   HDL 42.10 03/05/2017 1520   CHOLHDL 2 03/05/2017 1520   VLDL 15.6 03/05/2017 1520   LDLCALC 35 03/05/2017 1520   LDLDIRECT 64.0 03/20/2018 0950    SMBG: 7 day average 148 - Range 110-190 fasting, though only has ~8 checks in the past 14 days  Clinical ASCVD: Yes ;  High-risk conditions: Age 8891or older  A/P: Following discussion and approval by Dr. SCaryl Bis the following medication changes were made:   #Diabetes - Currently uncontrolled, most recent A1c 9.0% on 03/20/2018, improved from 9.5% on 01/06/2018; Goal A1c <7%; Patient believes that fastings <150 were at goal, and did not increase metformin.  - To maximize metformin benefit, increasing to maximal dose of 2000 mg daily. Renal function sufficient (eGFR >45 mL/min/1.769m - Continue Tresiba 45 units daily, Novolog 8 units with meals, Jardiance 25 mg daily and Trulicity 1.5 mg daily - Encouraged patient to continue to check fastings daily and 2 hour post prandials a few days a week - Next A1C anticipated 06/2018   #Hypertension currently uncontrolled, elevated today d/t not taking BP  medications before the visit. Goal BP <130/80.  - Continue to monitor BP at this time. Continue carvedilol 12.5 mg BID, lisinopril 2.5 mg daily, furosemide 20 mg QOD.  - Reiterated to patient that longstanding hypertension is likely to damage his kidneys, contribute to renal failure  #ASCVD risk - secondary prevention in patient with DM. Last LDL is controlled. ASCVD risk score is >20%  - High intensity statin indicated.  Aspirin is indicated.  -Continued aspirin 81 mg  -Continued atorvastatin 80 mg.   #Malaise - Physical exam and evaluation completed by Dr. [name]. Encouraged rest and adequate hydration.   Written patient instructions provided.  Total time in face to face counseling 30 minutes.    Follow up in Pharmacist Clinic Visit 4 weeks.   De Hollingshead, PharmD PGY2 Ambulatory Care Pharmacy Resident Phone: [phone]

## 2018-05-26 NOTE — Assessment & Plan Note (Signed)
#  Hypertension currently uncontrolled, elevated today d/t not taking BP medications before the visit. Goal BP <130/80.  - Continue to monitor BP at this time. Continue carvedilol 12.5 mg BID, lisinopril 2.5 mg daily, furosemide 20 mg QOD.  - Reiterated to patient that longstanding hypertension is likely to damage his kidneys, contribute to renal failure

## 2018-05-28 NOTE — Progress Notes (Signed)
I saw the patient with the clinical pharmacist.  I agree with her documentation.  The patient noted mild sore throat and postnasal drip.  No significant congestion.  No shortness of breath.  No fever.  Discussed monitoring with rest and adequate hydration.  If worsens he will be reevaluated.  Mild oropharyngeal erythema, no exudate, lungs are clear to auscultation bilaterally, heart is regular rate and rhythm with no abnormal sounds, TMs are normal bilaterally, no cervical adenopathy  Marikay Alar, MD

## 2018-06-23 ENCOUNTER — Ambulatory Visit: Payer: Medicare Other | Admitting: Pharmacist

## 2018-06-24 ENCOUNTER — Other Ambulatory Visit: Payer: Self-pay | Admitting: Family Medicine

## 2018-06-30 ENCOUNTER — Other Ambulatory Visit: Payer: Self-pay | Admitting: Family Medicine

## 2018-06-30 DIAGNOSIS — E1165 Type 2 diabetes mellitus with hyperglycemia: Secondary | ICD-10-CM

## 2018-06-30 DIAGNOSIS — E118 Type 2 diabetes mellitus with unspecified complications: Principal | ICD-10-CM

## 2018-06-30 DIAGNOSIS — IMO0002 Reserved for concepts with insufficient information to code with codable children: Secondary | ICD-10-CM

## 2018-07-01 ENCOUNTER — Telehealth: Payer: Self-pay | Admitting: Family Medicine

## 2018-07-01 MED ORDER — GLUCOSE BLOOD VI STRP: ORAL_STRIP | 2 refills | Status: DC

## 2018-07-01 NOTE — Telephone Encounter (Signed)
Rx has been refilled for patient.

## 2018-07-01 NOTE — Telephone Encounter (Signed)
Pt needs a refill on his CONTOUR NEXT TEST test strip.

## 2018-07-02 ENCOUNTER — Other Ambulatory Visit: Payer: Self-pay | Admitting: Family Medicine

## 2018-07-18 ENCOUNTER — Ambulatory Visit: Payer: Medicare Other | Admitting: Family Medicine

## 2018-08-25 ENCOUNTER — Other Ambulatory Visit: Payer: Self-pay | Admitting: Cardiovascular Disease

## 2018-09-01 ENCOUNTER — Encounter: Payer: Self-pay | Admitting: Family Medicine

## 2018-09-01 ENCOUNTER — Ambulatory Visit (INDEPENDENT_AMBULATORY_CARE_PROVIDER_SITE_OTHER): Payer: Medicare Other | Admitting: Family Medicine

## 2018-09-01 VITALS — BP 120/76 | HR 88 | Temp 97.7°F | Ht 71.0 in | Wt 237.2 lb

## 2018-09-01 DIAGNOSIS — IMO0002 Reserved for concepts with insufficient information to code with codable children: Secondary | ICD-10-CM

## 2018-09-01 DIAGNOSIS — E7800 Pure hypercholesterolemia, unspecified: Secondary | ICD-10-CM

## 2018-09-01 DIAGNOSIS — F32A Depression, unspecified: Secondary | ICD-10-CM

## 2018-09-01 DIAGNOSIS — E1165 Type 2 diabetes mellitus with hyperglycemia: Secondary | ICD-10-CM | POA: Diagnosis not present

## 2018-09-01 DIAGNOSIS — J432 Centrilobular emphysema: Secondary | ICD-10-CM

## 2018-09-01 DIAGNOSIS — F419 Anxiety disorder, unspecified: Secondary | ICD-10-CM

## 2018-09-01 DIAGNOSIS — Z23 Encounter for immunization: Secondary | ICD-10-CM

## 2018-09-01 DIAGNOSIS — E118 Type 2 diabetes mellitus with unspecified complications: Secondary | ICD-10-CM | POA: Diagnosis not present

## 2018-09-01 DIAGNOSIS — F329 Major depressive disorder, single episode, unspecified: Secondary | ICD-10-CM

## 2018-09-01 DIAGNOSIS — I10 Essential (primary) hypertension: Secondary | ICD-10-CM | POA: Diagnosis not present

## 2018-09-01 LAB — COMPREHENSIVE METABOLIC PANEL
ALT: 10 U/L (ref 0–53)
AST: 11 U/L (ref 0–37)
Albumin: 4.1 g/dL (ref 3.5–5.2)
Alkaline Phosphatase: 97 U/L (ref 39–117)
BUN: 25 mg/dL — ABNORMAL HIGH (ref 6–23)
CO2: 25 mEq/L (ref 19–32)
Calcium: 9.3 mg/dL (ref 8.4–10.5)
Chloride: 100 mEq/L (ref 96–112)
Creatinine, Ser: 1.34 mg/dL (ref 0.40–1.50)
GFR: 53.07 mL/min — ABNORMAL LOW (ref >60.00)
Glucose, Bld: 151 mg/dL — ABNORMAL HIGH (ref 70–99)
Potassium: 5 mEq/L (ref 3.5–5.1)
Sodium: 136 mEq/L (ref 135–145)
Total Bilirubin: 0.4 mg/dL (ref 0.2–1.2)
Total Protein: 6.5 g/dL (ref 6.0–8.3)

## 2018-09-01 LAB — LIPID PANEL
Cholesterol: 164 mg/dL (ref 0–200)
HDL: 44.4 mg/dL (ref >39.00)
LDL Cholesterol: 91 mg/dL (ref 0–99)
NonHDL: 119.67
Total CHOL/HDL Ratio: 4
Triglycerides: 143 mg/dL (ref 0.0–149.0)
VLDL: 28.6 mg/dL (ref 0.0–40.0)

## 2018-09-01 LAB — HEMOGLOBIN A1C: Hgb A1c MFr Bld: 6.9 % — ABNORMAL HIGH (ref 4.6–6.5)

## 2018-09-01 MED ORDER — SERTRALINE HCL 50 MG PO TABS: 50.0000 mg | ORAL_TABLET | Freq: Every day | ORAL | 3 refills | Status: DC

## 2018-09-01 MED ORDER — ESCITALOPRAM OXALATE 10 MG PO TABS: 10.0000 mg | ORAL_TABLET | Freq: Every day | ORAL | 2 refills | Status: DC

## 2018-09-01 NOTE — Assessment & Plan Note (Signed)
Stable with rare use of his inhalers.  He will monitor and continue his current regimen.

## 2018-09-01 NOTE — Assessment & Plan Note (Signed)
Check lipid panel  

## 2018-09-01 NOTE — Patient Instructions (Addendum)
Nice to see you. We will check lab work today and contact you with results. Please start on the Lexapro at 10 mg daily.  If you are taking the 5 mg tablets of Lexapro at home please discontinue this and start on the higher dose.

## 2018-09-01 NOTE — Assessment & Plan Note (Signed)
Well-controlled.  Continue current regimen. 

## 2018-09-01 NOTE — Progress Notes (Signed)
Danny Rumps, MD Phone: [phone]  Cristopher Estimable Elbaum Sr. is a 68 y.o. male who presents today for follow-up.  CC: Diabetes, hypertension, COPD, depression/anxiety  Diabetes: Typically 107-150 at home.  Taking Vania Rea, Trulicity, Tresiba 45 units daily, and metformin.  No polyuria or polydipsia.  No hypoglycemia.  Hypertension/COPD: Not checking at home.  He is taking carvedilol, Lasix, and lisinopril.  He notes no chest pain or edema.  Very rare dyspnea that responds to his inhaler.  No coughing or wheezing.  Depression/anxiety: Notes his mom is in the process of passing away.  He is not been sleeping well.  He does note some depression.  Some anxiety.  He is unsure if he is taking the Lexapro.  He notes no SI.  Social History   Tobacco Use  Smoking Status Former Smoker  . Packs/day: 3.00  . Years: 0.00  . Pack years: 0.00  . Types: Cigarettes  . Last attempt to quit: 09/07/2009  . Years since quitting: 8.9  Smokeless Tobacco Never Used  Tobacco Comment   quit june 2011     ROS see history of present illness  Objective  Physical Exam Vitals:   09/01/18 1353  BP: 120/76  Pulse: 88  Temp: 97.7 F (36.5 C)  SpO2: 97%    BP Readings from Last 3 Encounters:  09/01/18 120/76  05/26/18 (!) 162/115  05/22/18 (!) 159/94   Wt Readings from Last 3 Encounters:  09/01/18 237 lb 3.2 oz (107.6 kg)  05/26/18 243 lb 3.2 oz (110.3 kg)  05/22/18 234 lb (106.1 kg)    Physical Exam Constitutional:      General: He is not in acute distress.    Appearance: He is not diaphoretic.  Cardiovascular:     Rate and Rhythm: Normal rate and regular rhythm.     Heart sounds: Normal heart sounds.  Pulmonary:     Effort: Pulmonary effort is normal.     Breath sounds: Normal breath sounds.  Musculoskeletal:     Right lower leg: No edema.     Left lower leg: No edema.  Skin:    General: Skin is warm and dry.  Neurological:     Mental Status: He is alert.       Assessment/Plan: Please see individual problem list.  Essential hypertension Well-controlled.  Continue current regimen.  COPD (chronic obstructive pulmonary disease) (HCC) Stable with rare use of his inhalers.  He will monitor and continue his current regimen.  Diabetes mellitus type 2, uncontrolled, with complications (HCC) Check A1c.  Continue current regimen.  Hyperlipidemia Check lipid panel.  Anxiety and depression We will have the patient go on Lexapro 10 mg daily.  Initially Zoloft was sent to his pharmacy though he had an this previously and the [facility] contacted the pharmacy to cancel this prescription.  Follow-up in 2 months.  Given return precautions.    Orders Placed This Encounter  Procedures  . Flu vaccine HIGH DOSE PF (Fluzone High dose)  . HgB A1c  . Comp Met (CMET)  . Lipid panel    Meds ordered this encounter  Medications  . DISCONTD: sertraline (ZOLOFT) 50 MG tablet    Sig: Take 1 tablet (50 mg total) by mouth daily.    Dispense:  30 tablet    Refill:  3  . escitalopram (LEXAPRO) 10 MG tablet    Sig: Take 1 tablet (10 mg total) by mouth daily.    Dispense:  30 tablet    Refill:  2  Danny Rumps, MD [place]

## 2018-09-01 NOTE — Assessment & Plan Note (Signed)
Check A1c.  Continue current regimen. 

## 2018-09-01 NOTE — Assessment & Plan Note (Signed)
We will have the patient go on Lexapro 10 mg daily.  Initially Zoloft was sent to his pharmacy though he had an this previously and the CMA contacted the pharmacy to cancel this prescription.  Follow-up in 2 months.  Given return precautions.

## 2018-09-04 ENCOUNTER — Other Ambulatory Visit: Payer: Self-pay | Admitting: Family Medicine

## 2018-09-04 DIAGNOSIS — E785 Hyperlipidemia, unspecified: Secondary | ICD-10-CM

## 2018-09-04 MED ORDER — EZETIMIBE 10 MG PO TABS: 10.0000 mg | ORAL_TABLET | Freq: Every day | ORAL | 3 refills | Status: DC

## 2018-09-16 ENCOUNTER — Other Ambulatory Visit: Payer: Self-pay | Admitting: *Deleted

## 2018-09-16 MED ORDER — POTASSIUM CHLORIDE ER 10 MEQ PO CPCR: 10.0000 meq | ORAL_CAPSULE | Freq: Every day | ORAL | 1 refills | Status: DC

## 2018-09-17 ENCOUNTER — Other Ambulatory Visit: Payer: Self-pay | Admitting: Cardiovascular Disease

## 2018-09-18 ENCOUNTER — Other Ambulatory Visit: Payer: Medicare Other

## 2018-09-18 ENCOUNTER — Other Ambulatory Visit: Payer: Self-pay | Admitting: Family Medicine

## 2018-09-23 ENCOUNTER — Other Ambulatory Visit: Payer: Self-pay | Admitting: Family Medicine

## 2018-10-31 ENCOUNTER — Encounter: Payer: Self-pay | Admitting: Family Medicine

## 2018-10-31 ENCOUNTER — Ambulatory Visit (INDEPENDENT_AMBULATORY_CARE_PROVIDER_SITE_OTHER): Payer: Medicare Other | Admitting: Family Medicine

## 2018-10-31 DIAGNOSIS — F419 Anxiety disorder, unspecified: Secondary | ICD-10-CM | POA: Diagnosis not present

## 2018-10-31 DIAGNOSIS — F329 Major depressive disorder, single episode, unspecified: Secondary | ICD-10-CM | POA: Diagnosis not present

## 2018-10-31 DIAGNOSIS — J989 Respiratory disorder, unspecified: Secondary | ICD-10-CM | POA: Insufficient documentation

## 2018-10-31 DIAGNOSIS — R079 Chest pain, unspecified: Secondary | ICD-10-CM | POA: Insufficient documentation

## 2018-10-31 DIAGNOSIS — F32A Depression, unspecified: Secondary | ICD-10-CM

## 2018-10-31 NOTE — Assessment & Plan Note (Addendum)
Discussed with the patient that his symptoms may be related to a viral upper respiratory illness though could also represent coronavirus.  I discussed having him evaluated at the emergency department given his dyspnea though he declined this.  I advised that if he has worsening symptoms or persistent symptoms of dyspnea he needs to go to the emergency room by EMS and advise EMS when he calls them what symptoms he is having and that he has been advised to remain under home isolation for 14 days.  I discussed home isolation for 14 days given his symptoms.  If worsening he will seek medical attention in the ED.  He verbalized understanding.

## 2018-10-31 NOTE — Progress Notes (Addendum)
Virtual Visit via Telephone Note  I connected with Danny GASKA Sr. on 10/31/18 at  8:00 AM EDT by telephone and verified that I am speaking with the correct person using two identifiers.   I discussed the limitations, risks, security and privacy concerns of performing an evaluation and management service by telephone and the availability of in person appointments. I also discussed with the patient that there may be a patient responsible charge related to this service. The patient expressed understanding and agreed to proceed.  The patient Danny [name]) is at home and I Marikay Alar) was in the office. We participated in this visit.   Visit is for follow-up on Anxiety/Depression.  Patient also notes complaints of a cold and chest pain.  History of Present Illness: Anxiety/depression: Patient notes no issues with this.  He has been on Lexapro and is doing well.  No SI.  Cold: Patient notes for the last several days he has had a mild cough with mild rhinorrhea and postnasal drip.  He notes no significant sinus or chest congestion.  He thinks it may be allergies.  He has felt intermittently dyspneic over the last 2 to 3 days when he exerts himself.  No dyspnea at rest.  He has used his inhaler with some benefit.  He notes no wheezing.  No fevers.  He is using Symbicort.  Chest pain: Patient notes on 10/30/2018 at 1:30 in the morning he woke up with centralized chest discomfort.  He notes it felt like he was having a heart attack.  He took a nitroglycerin and it went away.  He notes it has not recurred since then.  He did note his arm on the left felt a little numb with this.  This all resolved with nitroglycerin.  He noted no diaphoresis.  No reflux.   Observations/Objective: This was a telehealth visit and no physical exam was performed.  The patient did not seem dyspneic on conversation.  Assessment and Plan: Anxiety and depression This is improved.  He will continue  Lexapro.  Respiratory illness Discussed with the patient that his symptoms may be related to a viral upper respiratory illness though could also represent coronavirus.  I discussed having him evaluated at the emergency department given his dyspnea though he declined this.  I advised that if he has worsening symptoms or persistent symptoms of dyspnea he needs to go to the emergency room by EMS and advise EMS when he calls them what symptoms he is having and that he has been advised to remain under home isolation for 14 days.  I discussed home isolation for 14 days given his symptoms.  If worsening he will seek medical attention in the ED.  He verbalized understanding.  Chest pain Patient's chest pain has typical and atypical features.  Typical in location with symptoms in 1 of his arms and resolution with nitroglycerin.  Atypical in that it occurred while he was asleep and laying down.  Given his history and other symptoms with his respiratory complaints of dyspnea on exertion I advised ED evaluation.  The patient declined this and advised me that he would seek medical attention by calling EMS if he had recurrent chest pain.  I advised that we would refer him back to his cardiologist.  He was given return precautions.    Follow Up Instructions: Patient will follow-up in the office as scheduled in August.  Advised him to contact us if he had any further questions or if he developed worsening  symptoms.  I discussed the assessment and treatment plan with the patient. The patient was provided an opportunity to ask questions and all were answered. The patient agreed with the plan and demonstrated an understanding of the instructions.   The patient was advised to call back or seek an in-person evaluation if the symptoms worsen or if the condition fails to improve as anticipated.  I provided 15 minutes of non-face-to-face time during this encounter.   Marikay Alar, MD

## 2018-10-31 NOTE — Assessment & Plan Note (Signed)
This is improved.  He will continue Lexapro.

## 2018-10-31 NOTE — Assessment & Plan Note (Addendum)
Patient's chest pain has typical and atypical features.  Typical in location with symptoms in 1 of his arms and resolution with nitroglycerin.  Atypical in that it occurred while he was asleep and laying down.  Given his history and other symptoms with his respiratory complaints of dyspnea on exertion I advised ED evaluation.  The patient declined this and advised me that he would seek medical attention by calling EMS if he had recurrent chest pain.  I advised that we would refer him back to his cardiologist.  He was given return precautions.

## 2018-11-21 ENCOUNTER — Other Ambulatory Visit: Payer: Self-pay

## 2018-11-21 MED ORDER — VENTOLIN HFA 108 (90 BASE) MCG/ACT IN AERS: 2.0000 | INHALATION_SPRAY | Freq: Four times a day (QID) | RESPIRATORY_TRACT | 0 refills | Status: DC | PRN

## 2018-11-21 MED ORDER — PEN NEEDLES 32G X 6 MM MISC: 1.0000 | Freq: Two times a day (BID) | 11 refills | Status: DC

## 2018-11-21 MED ORDER — BUDESONIDE-FORMOTEROL FUMARATE 160-4.5 MCG/ACT IN AERO: 2.0000 | INHALATION_SPRAY | Freq: Two times a day (BID) | RESPIRATORY_TRACT | 4 refills | Status: DC

## 2018-12-10 ENCOUNTER — Telehealth: Payer: Self-pay | Admitting: Cardiovascular Disease

## 2018-12-10 NOTE — Telephone Encounter (Signed)
Virtual Visit Pre-Appointment Phone Call  "(Name), I am calling you today to discuss your upcoming appointment. We are currently trying to limit exposure to the virus that causes COVID-19 by seeing patients at home rather than in the office."  1. "What is the BEST phone number to call the day of the visit?" - include this in appointment notes  2. Do you have or have access to (through a family member/friend) a smartphone with video capability that we can use for your visit?" a. If yes - list this number in appt notes as cell (if different from BEST phone #) and list the appointment type as a VIDEO visit in appointment notes b. If no - list the appointment type as a PHONE visit in appointment notes  3. Confirm consent - "In the setting of the current Covid19 crisis, you are scheduled for a (phone or video) visit with your provider on (date) at (time).  Just as we do with many in-office visits, in order for you to participate in this visit, we must obtain consent.  If you'd like, I can send this to your mychart (if signed up) or email for you to review.  Otherwise, I can obtain your verbal consent now.  All virtual visits are billed to your insurance company just like a normal visit would be.  By agreeing to a virtual visit, we'd like you to understand that the technology does not allow for your provider to perform an examination, and thus may limit your provider's ability to fully assess your condition. If your provider identifies any concerns that need to be evaluated in person, we will make arrangements to do so.  Finally, though the technology is pretty good, we cannot assure that it will always work on either your or our end, and in the setting of a video visit, we may have to convert it to a phone-only visit.  In either situation, we cannot ensure that we have a secure connection.  Are you willing to proceed?" STAFF: Did the patient verbally acknowledge consent to telehealth visit? Document  YES/NO here: YES  4. Advise patient to be prepared - "Two hours prior to your appointment, go ahead and check your blood pressure, pulse, oxygen saturation, and your weight (if you have the equipment to check those) and write them all down. When your visit starts, your provider will ask you for this information. If you have an Apple Watch or Kardia device, please plan to have heart rate information ready on the day of your appointment. Please have a pen and paper handy nearby the day of the visit as well."  5. Give patient instructions for MyChart download to smartphone OR Doximity/Doxy.me as below if video visit (depending on what platform provider is using)  6. Inform patient they will receive a phone call 15 minutes prior to their appointment time (may be from unknown caller ID) so they should be prepared to answer    TELEPHONE CALL NOTE  Gerrit Friends Segler Sr. has been deemed a candidate for a follow-up tele-health visit to limit community exposure during the Covid-19 pandemic. I spoke with the patient via phone to ensure availability of phone/video source, confirm preferred email & phone number, and discuss instructions and expectations.  I reminded Danny Hoggarth Tetzloff Sr. to be prepared with any vital sign and/or heart rhythm information that could potentially be obtained via home monitoring, at the time of his visit. I reminded Danny Dort Yokum Sr. to expect a phone  call prior to his visit.  Morrie Sheldonshley Gerringer 12/10/2018 3:22 PM   INSTRUCTIONS FOR DOWNLOADING THE MYCHART APP TO SMARTPHONE  - The patient must first make sure to have activated MyChart and know their login information - If Apple, go to [employer] Store and type in MyChart in the search bar and download the app. If Android, ask patient to go to [employer] Play Store and type in [facility]Chart in the search bar and download the app. The app is free but as with any other app downloads, their phone may require them to verify saved payment information or  Apple/Android password.  - The patient will need to then log into the app with their MyChart username and password, and select [facility] as their healthcare provider to link the account. When it is time for your visit, go to the MyChart app, find appointments, and click Begin Video Visit. Be sure to Select Allow for your device to access the Microphone and Camera for your visit. You will then be connected, and your provider will be with you shortly.  **If they have any issues connecting, or need assistance please contact MyChart service desk [phone])**  **If using a computer, in order to ensure the best quality for their visit they will need to use either of the following Internet Browsers: [employer] Edge, or Google Chrome**  IF USING DOXIMITY or DOXY.ME - The patient will receive a link just prior to their visit by text.     FULL LENGTH CONSENT FOR TELE-HEALTH VISIT   I hereby voluntarily request, consent and authorize [facility] and its employed or contracted physicians, physician assistants, nurse practitioners or other licensed health care professionals (the Practitioner), to provide me with telemedicine health care services (the Services") as deemed necessary by the treating Practitioner. I acknowledge and consent to receive the Services by the Practitioner via telemedicine. I understand that the telemedicine visit will involve communicating with the Practitioner through live audiovisual communication technology and the disclosure of certain medical information by electronic transmission. I acknowledge that I have been given the opportunity to request an in-person assessment or other available alternative prior to the telemedicine visit and am voluntarily participating in the telemedicine visit.  I understand that I have the right to withhold or withdraw my consent to the use of telemedicine in the course of my care at any time, without affecting my right to future care  or treatment, and that the Practitioner or I may terminate the telemedicine visit at any time. I understand that I have the right to inspect all information obtained and/or recorded in the course of the telemedicine visit and may receive copies of available information for a reasonable fee.  I understand that some of the potential risks of receiving the Services via telemedicine include:   Delay or interruption in medical evaluation due to technological equipment failure or disruption;  Information transmitted may not be sufficient (e.g. poor resolution of images) to allow for appropriate medical decision making by the Practitioner; and/or   In rare instances, security protocols could fail, causing a breach of personal health information.  Furthermore, I acknowledge that it is my responsibility to provide information about my medical history, conditions and care that is complete and accurate to the best of my ability. I acknowledge that Practitioner's advice, recommendations, and/or decision may be based on factors not within their control, such as incomplete or inaccurate data provided by me or distortions of diagnostic images or specimens that may result from electronic  transmissions. I understand that the practice of medicine is not an exact science and that Practitioner makes no warranties or guarantees regarding treatment outcomes. I acknowledge that I will receive a copy of this consent concurrently upon execution via email to the email address I last provided but may also request a printed copy by calling the office of [facility].    I understand that my insurance will be billed for this visit.   I have read or had this consent read to me.  I understand the contents of this consent, which adequately explains the benefits and risks of the Services being provided via telemedicine.   I have been provided ample opportunity to ask questions regarding this consent and the Services and have had  my questions answered to my satisfaction.  I give my informed consent for the services to be provided through the use of telemedicine in my medical care  By participating in this telemedicine visit I agree to the above.

## 2018-12-12 ENCOUNTER — Other Ambulatory Visit: Payer: Self-pay

## 2018-12-12 ENCOUNTER — Telehealth (INDEPENDENT_AMBULATORY_CARE_PROVIDER_SITE_OTHER): Payer: Medicare Other | Admitting: Cardiovascular Disease

## 2018-12-12 DIAGNOSIS — IMO0002 Reserved for concepts with insufficient information to code with codable children: Secondary | ICD-10-CM

## 2018-12-12 DIAGNOSIS — I25118 Atherosclerotic heart disease of native coronary artery with other forms of angina pectoris: Secondary | ICD-10-CM | POA: Diagnosis not present

## 2018-12-12 DIAGNOSIS — J439 Emphysema, unspecified: Secondary | ICD-10-CM | POA: Insufficient documentation

## 2018-12-12 DIAGNOSIS — J432 Centrilobular emphysema: Secondary | ICD-10-CM

## 2018-12-12 DIAGNOSIS — N183 Chronic kidney disease, stage 3 unspecified: Secondary | ICD-10-CM

## 2018-12-12 DIAGNOSIS — I10 Essential (primary) hypertension: Secondary | ICD-10-CM

## 2018-12-12 DIAGNOSIS — E785 Hyperlipidemia, unspecified: Secondary | ICD-10-CM

## 2018-12-12 DIAGNOSIS — I5042 Chronic combined systolic (congestive) and diastolic (congestive) heart failure: Secondary | ICD-10-CM

## 2018-12-12 DIAGNOSIS — E1165 Type 2 diabetes mellitus with hyperglycemia: Secondary | ICD-10-CM

## 2018-12-12 DIAGNOSIS — E118 Type 2 diabetes mellitus with unspecified complications: Secondary | ICD-10-CM | POA: Diagnosis not present

## 2018-12-12 DIAGNOSIS — E7800 Pure hypercholesterolemia, unspecified: Secondary | ICD-10-CM

## 2018-12-12 MED ORDER — LISINOPRIL 2.5 MG PO TABS: 2.5000 mg | ORAL_TABLET | Freq: Every day | ORAL | 3 refills | Status: DC

## 2018-12-12 MED ORDER — POTASSIUM CHLORIDE ER 10 MEQ PO CPCR: ORAL_CAPSULE | ORAL | 3 refills | Status: DC

## 2018-12-12 MED ORDER — CLOPIDOGREL BISULFATE 75 MG PO TABS: 75.0000 mg | ORAL_TABLET | Freq: Every day | ORAL | 3 refills | Status: DC

## 2018-12-12 MED ORDER — FUROSEMIDE 20 MG PO TABS: ORAL_TABLET | ORAL | 3 refills | Status: DC

## 2018-12-12 MED ORDER — CARVEDILOL 12.5 MG PO TABS: 12.5000 mg | ORAL_TABLET | Freq: Two times a day (BID) | ORAL | 3 refills | Status: DC

## 2018-12-12 MED ORDER — ATORVASTATIN CALCIUM 80 MG PO TABS: 80.0000 mg | ORAL_TABLET | Freq: Every day | ORAL | 3 refills | Status: DC

## 2018-12-12 MED ORDER — ISOSORBIDE MONONITRATE ER 30 MG PO TB24: 30.0000 mg | ORAL_TABLET | Freq: Every day | ORAL | 3 refills | Status: DC

## 2018-12-12 MED ORDER — EZETIMIBE 10 MG PO TABS: 10.0000 mg | ORAL_TABLET | Freq: Every day | ORAL | 3 refills | Status: DC

## 2018-12-12 MED ORDER — NITROGLYCERIN 0.4 MG SL SUBL: 0.4000 mg | SUBLINGUAL_TABLET | SUBLINGUAL | 3 refills | Status: DC | PRN

## 2018-12-12 NOTE — Patient Instructions (Addendum)
Medication Instructions:  Your physician has recommended you make the following change in your medication:   1) Please take potassium 10 meq - 1 tablet by mouth EVERY OTHER DAY, not every day  - all of your cardiac medications have been refilled to [facility] on [address]  If you need a refill on your cardiac medications before your next appointment, please call your pharmacy.    Lab work: No new labs needed   If you have labs (blood work) drawn today and your tests are completely normal, you will receive your results only by: [name] MyChart Message (if you have MyChart) OR . A paper copy in the mail If you have any lab test that is abnormal or we need to change your treatment, we will call you to review the results.   Testing/Procedures: No new testing needed   Follow-Up: At [facility], you and your health needs are our priority.  As part of our continuing mission to provide you with exceptional heart care, we have created designated Provider Care Teams.  These Care Teams include your primary Cardiologist (physician) and Advanced Practice Providers (APPs -  Physician Assistants and Nurse Practitioners) who all work together to provide you with the care you need, when you need it.  . You will need a follow up appointment in 6 months (November)  .   Please call our office 2 months in advance to schedule this appointment.  (call the office in early September to schedule).   . Providers on your designated Care Team:   . Nicolasa Ducking, NP . Eula Listen, PA-C . Marisue Ivan, PA-C  Any Other Special Instructions Will Be Listed Below (If Applicable).  For educational health videos Log in to : [email or web address] Or : [email or web address], password : [email or web address]

## 2018-12-12 NOTE — Progress Notes (Signed)
Virtual Visit via Telephone Note   This visit type was conducted due to national recommendations for restrictions regarding the COVID-19 Pandemic (e.g. social distancing) in an effort to limit this patient's exposure and mitigate transmission in our community.  Due to his co-morbid illnesses, this patient is at least at moderate risk for complications without adequate follow up.  This format is felt to be most appropriate for this patient at this time.  The patient did not have access to video technology/had technical difficulties with video requiring transitioning to audio format only (telephone).  All issues noted in this document were discussed and addressed.  No physical exam could be performed with this format.  Please refer to the patient's chart for his  consent to telehealth for [facility]   I connected with  Danny Bail Connors Sr. on 12/12/18 by a video enabled telemedicine application and verified that I am speaking with the correct person using two identifiers. I discussed the limitations of evaluation and management by telemedicine. The patient expressed understanding and agreed to proceed.   Evaluation Performed:  Follow-up visit  Date:  12/12/2018   ID:  Danny Organ Sr., DOB 06-03-1951, MRN [id number]  Patient Location:  [address] [place] [address]   Provider location:   [facility], [place] office  PCP:  [name], MD  Cardiologist:  [name]   Chief Complaint:  Feet tingling    History of Present Illness:    Danny SEPTER Sr. is a 68 y.o. male who presents via audio/video conferencing for a telehealth visit today.   The patient does not symptoms concerning for COVID-19 infection (fever, chills, cough, or new SHORTNESS OF BREATH).   Patient has a past medical history of long smoking history,  coronary artery disease,  cardiac catheterization showing severe two-vessel disease,  severe stenosis of the proximal to mid  LAD, severe ostial O1 and O2 disease, moderate to severe mid left circumflex disease that appeared aneurysmal, moderate OM 2 disease ejection fraction 40%  PCI of the LAD on January 11, 2010,  smoke 4 packs a day who stopped earlier in 2011,  Chronic renal insufficiency,  poorly controlled diabetes on insulin,  hyperlipidemia who presents for routine followup of his coronary artery disease.  Dr. Lucky Cowboy  lower extremity angiography  Declined  pain in his feet Walks with a cane No sores Has a tingle  Bp stable No orthostasis Little dizzy  Mother is very sick  HBA1C 10.4 to 6.9 in Jan 2020 Total chol 164, LDL 91  Neuropathy in his legs, taste Neurontin  Other past medical history reviewed emergency room 05/01/2016 for chest pain. Developed after he was eating a biscuit Hospital records reviewed He was not taking his insulin, drinking Coca-Cola when he had symptoms Took several nitroglycerin, aspirin by EMS. Still had chest discomfort in the ER Cardiac enzymes negative. Symptoms possibly GI related Reports he was not wearing oxygen  Chronic mild shortness of breath which is stable, denies  chest pain Denies any significant lower extremity edema, no PND or orthopnea He sees Dr. Gala Murdoch office every 3 months  Prior to his last clinic visit, he had 2 bags of potato chips. He woke up later overnight with significant chest discomfort/upper epigastric pain. He took nitroglycerin which helped his symptoms for 30 minutes but pain returned. He took several nitroglycerin through the night. Symptoms better the next day, Sunday and feels well today. He has had GI  issues before with symptoms of GERD. He reports that PPI was stopped by primary care some time ago. Prior episodes of syncope, was sitting at the time, witnessed by a friend. Resolved quickly with no postictal changes. No symptoms since that time several years ago    Prior CV studies:   The following studies were reviewed today:     Past Medical History:  Diagnosis Date  . CAD (coronary artery disease)    a. 01/2010 PCI of LAD; b. 09/2014 PCI/DES of mLAD due to ISR. D1 80, D2 80(jailed), LCX 50m c. 07/2016 NSTEMI/Cath: LM nl, LAD 268mSR, 40d, D1 90ost, 80p, D2 90ost, RI min irregs, LCX min irregs, OM1 90 small, OM2/3 min irregs, RCA min irregs, RPLB1 90, EF 25-35%.  . Chronic combined systolic and diastolic CHF (congestive heart failure) ([facility]   a. 07/2016 Echo: EF 30%, severe septal/anterior HK, Gr1 DD.  . Marland KitchenKD (chronic kidney disease), stage III ([facility]  . COPD (chronic obstructive pulmonary disease) ([facility]  . DM2 (diabetes mellitus, type 2) ([facility]  . Erectile dysfunction   . HLD (hyperlipidemia)   . HTN (hypertension)   . Ischemic cardiomyopathy    a. 07/2016 Echo: EF 30% w/ sev septal/ant HK. Gr1 DD.   Past Surgical History:  Procedure Laterality Date  . CAD: stent to the LAD    . CARDIAC CATHETERIZATION  10/01/2014  . CARDIAC CATHETERIZATION N/A 07/23/2016   Procedure: Left Heart Cath and Coronary Angiography;  Surgeon: MuWellington HampshireMD;  [facility];  Service: Cardiovascular;  Laterality: N/A;  . CORONARY ANGIOPLASTY WITH STENT PLACEMENT  10/01/2014     Current Meds  Medication Sig  . acetaminophen (TYLENOL) 325 MG tablet Take 650 mg by mouth every 6 (six) hours as needed.  . Marland Kitchenspirin 81 MG tablet Take 81 mg by mouth daily.    . Marland Kitchentorvastatin (LIPITOR) 80 MG tablet Take 1 tablet (80 mg total) by mouth daily.  . blood glucose meter kit and supplies KIT Dispense based on patient and insurance preference. Check CBGs 4 times daily. Dx code E11.9.  . budesonide-formoterol (SYMBICORT) 160-4.5 MCG/ACT inhaler Inhale 2 puffs into the lungs 2 (two) times daily.  . carvedilol (COREG) 12.5 MG tablet Take 1 tablet (12.5 mg total) by mouth 2 (two) times daily with a meal.  . clonazePAM (KLONOPIN) 0.5 MG tablet Take 0.5 tablets (0.25 mg total) by mouth 2 (two) times daily as needed for anxiety.   . clopidogrel (PLAVIX) 75 MG tablet TAKE ONE TABLET BY MOUTH EVERY DAY  . CONTOUR NEXT TEST test strip TEST TWICE A DAY  . cyclobenzaprine (FLEXERIL) 5 MG tablet Take 1 tablet (5 mg total) by mouth 3 (three) times daily as needed for muscle spasms.  . Dulaglutide 1.5 MG/0.5ML SOPN INJECT 1.5 MG INTO THE SKIN ONCE A WEEK  . empagliflozin (JARDIANCE) 25 MG TABS tablet Take 25 mg by mouth daily.  . Marland Kitchenscitalopram (LEXAPRO) 10 MG tablet TAKE 1 TABLET BY MOUTH EVERY DAY  . ezetimibe (ZETIA) 10 MG tablet Take 1 tablet (10 mg total) by mouth daily.  . furosemide (LASIX) 20 MG tablet TAKE 1 TABLET (20 MG TOTAL) BY MOUTH EVERY OTHER DAY.  . Marland KitchenYDROcodone-acetaminophen (NORCO/VICODIN) 5-325 MG tablet Take 1 tablet by mouth every 4 (four) hours as needed for moderate pain.  . Marland Kitchennsulin aspart (NOVOLOG FLEXPEN) 100 UNIT/ML FlexPen Inject 8 units under the skin twice daily with meals  . insulin degludec (TRESIBA FLEXTOUCH) 100 UNIT/ML  SOPN FlexTouch Pen Inject 0.45 mLs (45 Units total) into the skin daily.  . Insulin Pen Needle (PEN NEEDLES) 32G X 6 MM MISC 1 each by Does not apply route 2 (two) times daily.  . isosorbide mononitrate (IMDUR) 30 MG 24 hr tablet TAKE ONE TABLET BY MOUTH EVERY DAY  . Lancets Misc. (ACCU-CHEK SOFTCLIX LANCET DEV) KIT Dispense one lancing device to test blood glucose four times daily. Dx code E11.9  . lisinopril (PRINIVIL,ZESTRIL) 2.5 MG tablet TAKE ONE TABLET BY MOUTH EVERY DAY  . metFORMIN (GLUCOPHAGE XR) 500 MG 24 hr tablet Take 2 tablets (1,000 mg total) by mouth 2 (two) times daily. (Patient taking differently: Take 1,000 mg by mouth 2 (two) times daily. Pt stated that he is taking 1 tablet BID can't take 2 tablets BID due to upset stomach.)  . nitroGLYCERIN (NITROSTAT) 0.4 MG SL tablet Place 1 tablet (0.4 mg total) under the tongue every 5 (five) minutes as needed for chest pain.  . potassium chloride (MICRO-K) 10 MEQ CR capsule Take 1 capsule (10 mEq total) by mouth daily. Take  with lasix  . pregabalin (LYRICA) 50 MG capsule TAKE ONE CAPSULE BY MOUTH TWICE A DAY  . tamsulosin (FLOMAX) 0.4 MG CAPS capsule TAKE ONE CAPSULE BY MOUTH EVERY DAY  . VENTOLIN HFA 108 (90 Base) MCG/ACT inhaler Inhale 2 puffs into the lungs every 6 (six) hours as needed for wheezing or shortness of breath.   Current Facility-Administered Medications for the 12/12/18 encounter (Telemedicine) with [name], MD  Medication  . famotidine (PEPCID) tablet 40 mg  . methylPREDNISolone sodium succinate (SOLU-MEDROL) 130 mg in sodium chloride 0.9 % 50 mL IVPB     Allergies:   Patient has no known allergies.   Social History   Tobacco Use  . Smoking status: Former Smoker    Packs/day: 3.00    Years: 0.00    Pack years: 0.00    Types: Cigarettes    Last attempt to quit: 09/07/2009    Years since quitting: 9.2  . Smokeless tobacco: Never Used  . Tobacco comment: quit june 2011  Substance Use Topics  . Alcohol use: Yes    Comment: every other weekend  . Drug use: No     Current Outpatient Medications on File Prior to Visit  Medication Sig Dispense Refill  . acetaminophen (TYLENOL) 325 MG tablet Take 650 mg by mouth every 6 (six) hours as needed.    [name] aspirin 81 MG tablet Take 81 mg by mouth daily.      [name] atorvastatin (LIPITOR) 80 MG tablet Take 1 tablet (80 mg total) by mouth daily. 90 tablet 1  . blood glucose meter kit and supplies KIT Dispense based on patient and insurance preference. Check CBGs 4 times daily. Dx code E11.9. 1 each 0  . budesonide-formoterol (SYMBICORT) 160-4.5 MCG/ACT inhaler Inhale 2 puffs into the lungs 2 (two) times daily. 10.2 g 4  . carvedilol (COREG) 12.5 MG tablet Take 1 tablet (12.5 mg total) by mouth 2 (two) times daily with a meal. 180 tablet 1  . clonazePAM (KLONOPIN) 0.5 MG tablet Take 0.5 tablets (0.25 mg total) by mouth 2 (two) times daily as needed for anxiety. 5 tablet 0  . clopidogrel (PLAVIX) 75 MG tablet TAKE ONE TABLET BY MOUTH EVERY DAY 90  tablet 1  . CONTOUR NEXT TEST test strip TEST TWICE A DAY 100 each 2  . cyclobenzaprine (FLEXERIL) 5 MG tablet Take 1 tablet (5 mg total) by mouth  3 (three) times daily as needed for muscle spasms. 15 tablet 0  . Dulaglutide 1.5 MG/0.5ML SOPN INJECT 1.5 MG INTO THE SKIN ONCE A WEEK 2 pen 11  . empagliflozin (JARDIANCE) 25 MG TABS tablet Take 25 mg by mouth daily. 90 tablet 3  . escitalopram (LEXAPRO) 10 MG tablet TAKE 1 TABLET BY MOUTH EVERY DAY 90 tablet 1  . ezetimibe (ZETIA) 10 MG tablet Take 1 tablet (10 mg total) by mouth daily. 90 tablet 3  . furosemide (LASIX) 20 MG tablet TAKE 1 TABLET (20 MG TOTAL) BY MOUTH EVERY OTHER DAY. 30 tablet 5  . HYDROcodone-acetaminophen (NORCO/VICODIN) 5-325 MG tablet Take 1 tablet by mouth every 4 (four) hours as needed for moderate pain. 10 tablet 0  . insulin aspart (NOVOLOG FLEXPEN) 100 UNIT/ML FlexPen Inject 8 units under the skin twice daily with meals 15 mL 2  . insulin degludec (TRESIBA FLEXTOUCH) 100 UNIT/ML SOPN FlexTouch Pen Inject 0.45 mLs (45 Units total) into the skin daily. 15 pen 1  . Insulin Pen Needle (PEN NEEDLES) 32G X 6 MM MISC 1 each by Does not apply route 2 (two) times daily. 100 each 11  . isosorbide mononitrate (IMDUR) 30 MG 24 hr tablet TAKE ONE TABLET BY MOUTH EVERY DAY 90 tablet 0  . Lancets Misc. (ACCU-CHEK SOFTCLIX LANCET DEV) KIT Dispense one lancing device to test blood glucose four times daily. Dx code E11.9 1 kit 0  . lisinopril (PRINIVIL,ZESTRIL) 2.5 MG tablet TAKE ONE TABLET BY MOUTH EVERY DAY 90 tablet 1  . metFORMIN (GLUCOPHAGE XR) 500 MG 24 hr tablet Take 2 tablets (1,000 mg total) by mouth 2 (two) times daily. (Patient taking differently: Take 1,000 mg by mouth 2 (two) times daily. Pt stated that he is taking 1 tablet BID can't take 2 tablets BID due to upset stomach.) 180 tablet 3  . nitroGLYCERIN (NITROSTAT) 0.4 MG SL tablet Place 1 tablet (0.4 mg total) under the tongue every 5 (five) minutes as needed for chest pain. 25  tablet 1  . potassium chloride (MICRO-K) 10 MEQ CR capsule Take 1 capsule (10 mEq total) by mouth daily. Take with lasix 90 capsule 1  . pregabalin (LYRICA) 50 MG capsule TAKE ONE CAPSULE BY MOUTH TWICE A DAY 60 capsule 1  . tamsulosin (FLOMAX) 0.4 MG CAPS capsule TAKE ONE CAPSULE BY MOUTH EVERY DAY 90 capsule 1  . VENTOLIN HFA 108 (90 Base) MCG/ACT inhaler Inhale 2 puffs into the lungs every 6 (six) hours as needed for wheezing or shortness of breath. 1 Inhaler 0   Current Facility-Administered Medications on File Prior to Visit  Medication Dose Route Frequency Provider Last Rate Last Dose  . famotidine (PEPCID) tablet 40 mg  40 mg Oral PRN Stegmayer, Kimberly A, PA-C      . methylPREDNISolone sodium succinate (SOLU-MEDROL) 130 mg in sodium chloride 0.9 % 50 mL IVPB  130 mg Intravenous PRN Stegmayer, Janalyn Harder, PA-C         Family Hx: The patient's family history includes Cancer in his brother; Heart attack in his father.  ROS:   Please see the history of present illness.    Review of Systems  Constitutional: Negative.   Respiratory: Negative.   Cardiovascular: Negative.   Gastrointestinal: Negative.   Musculoskeletal: Positive for back pain and joint pain.  Neurological: Negative.   Psychiatric/Behavioral: Negative.   All other systems reviewed and are negative.     Labs/Other Tests and Data Reviewed:    Recent Labs: 01/20/2018:  Hemoglobin 16.2; Platelets 201.0 09/01/2018: ALT 10; BUN 25; Creatinine, Ser 1.34; Potassium 5.0; Sodium 136   Recent Lipid Panel Lab Results  Component Value Date/Time   CHOL 164 09/01/2018 02:19 PM   TRIG 143.0 09/01/2018 02:19 PM   HDL 44.40 09/01/2018 02:19 PM   CHOLHDL 4 09/01/2018 02:19 PM   LDLCALC 91 09/01/2018 02:19 PM   LDLDIRECT 64.0 03/20/2018 09:50 AM    Wt Readings from Last 3 Encounters:  09/01/18 237 lb 3.2 oz (107.6 kg)  05/26/18 243 lb 3.2 oz (110.3 kg)  05/22/18 234 lb (106.1 kg)     Exam:    Vital Signs: Vital signs  may also be detailed in the HPI There were no vitals taken for this visit.  Wt Readings from Last 3 Encounters:  09/01/18 237 lb 3.2 oz (107.6 kg)  05/26/18 243 lb 3.2 oz (110.3 kg)  05/22/18 234 lb (106.1 kg)   Temp Readings from Last 3 Encounters:  09/01/18 97.7 F (36.5 C) (Oral)  05/22/18 98 F (36.7 C) (Oral)  03/21/18 97.9 F (36.6 C) (Oral)   BP Readings from Last 3 Encounters:  09/01/18 120/76  05/26/18 (!) 162/115  05/22/18 (!) 159/94   Pulse Readings from Last 3 Encounters:  09/01/18 88  05/26/18 97  05/22/18 99     120/70, pulse 80 resp 16  Well nourished, well developed male in no acute distress. Constitutional:  oriented to person, place, and time. No distress.    ASSESSMENT & PLAN:    Chronic combined systolic and diastolic CHF (congestive heart failure) (HCC) By his account is relatively euvolemic We will continue current medications, weight stable, no edema, no PND orthopnea  Coronary artery disease of native artery of native heart with stable angina pectoris (HCC) Currently with no symptoms of angina. No further workup at this time. Continue current medication regimen. Continue statin , Zetia, beta-blocker, long-acting nitrate  Centrilobular emphysema (HCC) Breathing stable, no regular exercise program No longer smoking  Essential hypertension Prior history of orthostasis He does have occasional dizziness, no blood pressure measurements available Recommend he stay hydrated, call our office if he has worsening orthostasis symptoms  Diabetes mellitus type 2, uncontrolled, with complications ([place]) Dramatic improvement in his A1c down into the 6 range Stopped drinking soda is now diet soda  CKD (chronic kidney disease) stage 3, GFR 30-59 ml/min (HCC) Stable renal function  Pure hypercholesterolemia Recent lipid panel slightly above goal, recommended compliance with his medications   COVID-19 Education: The signs and symptoms of COVID-19  were discussed with the patient and how to seek care for testing (follow up with PCP or arrange E-visit).  The importance of social distancing was discussed today.  Patient Risk:   After full review of this patients clinical status, I feel that they are at least moderate risk at this time.  Time:   Today, I have spent 25 minutes with the patient with telehealth technology discussing the cardiac and medical problems/diagnoses detailed above   10 min spent reviewing the chart prior to patient visit today   Medication Adjustments/Labs and Tests Ordered: Current medicines are reviewed at length with the patient today.  Concerns regarding medicines are outlined above.   Tests Ordered: No tests ordered   Medication Changes: Changes as above   Disposition: Follow-up in 6 months   Signed, Ida Rogue, MD  12/12/2018 2:52 PM    [facility] [address] [place] [id number], [place], [place] [address]

## 2018-12-24 ENCOUNTER — Other Ambulatory Visit: Payer: Self-pay | Admitting: Family Medicine

## 2019-01-14 ENCOUNTER — Other Ambulatory Visit: Payer: Self-pay | Admitting: Family Medicine

## 2019-02-01 ENCOUNTER — Telehealth: Payer: Self-pay | Admitting: Family Medicine

## 2019-02-01 DIAGNOSIS — J989 Respiratory disorder, unspecified: Secondary | ICD-10-CM

## 2019-02-01 NOTE — Telephone Encounter (Signed)
Please let the patient know that I received a reminder that he had not completed a follow-up chest xray that he was supposed to have last year after being treated for a respiratory infection. Please see if he is willing to come in to have this completed. If he is please screen him with the covid symptom questions and then I can place an order for the CXR. Thanks.

## 2019-02-02 ENCOUNTER — Other Ambulatory Visit: Payer: Self-pay

## 2019-02-02 NOTE — Addendum Note (Signed)
Addended by: Fulton Mole D on: 02/02/2019 11:10 AM   Modules accepted: Orders

## 2019-02-02 NOTE — Telephone Encounter (Signed)
Chest xray ordered and patient notified and scheduled for xray this Thursday.  Danashia Landers,cma

## 2019-02-05 ENCOUNTER — Ambulatory Visit (INDEPENDENT_AMBULATORY_CARE_PROVIDER_SITE_OTHER): Payer: Medicare Other

## 2019-02-05 ENCOUNTER — Other Ambulatory Visit: Payer: Self-pay

## 2019-02-05 DIAGNOSIS — J989 Respiratory disorder, unspecified: Secondary | ICD-10-CM

## 2019-02-05 DIAGNOSIS — J439 Emphysema, unspecified: Secondary | ICD-10-CM | POA: Diagnosis not present

## 2019-02-11 ENCOUNTER — Telehealth: Payer: Self-pay | Admitting: *Deleted

## 2019-02-11 NOTE — Telephone Encounter (Signed)
Received referral for low dose lung cancer screening CT scan. Message left at phone number listed in EMR for patient to call me back to facilitate scheduling scan.  

## 2019-02-12 ENCOUNTER — Telehealth: Payer: Self-pay | Admitting: *Deleted

## 2019-02-12 NOTE — Telephone Encounter (Signed)
Received referral for low dose lung cancer screening CT scan. Message left at phone number listed in EMR for patient to call me back to facilitate scheduling scan.  

## 2019-02-13 ENCOUNTER — Telehealth: Payer: Self-pay | Admitting: *Deleted

## 2019-02-13 NOTE — Telephone Encounter (Signed)
Received referral for low dose lung cancer screening CT scan. Message left at phone number listed in EMR for patient to call me back to facilitate scheduling scan.  

## 2019-02-18 ENCOUNTER — Other Ambulatory Visit: Payer: Self-pay

## 2019-02-18 NOTE — Patient Outreach (Signed)
[facility]) Care Management  02/18/2019  Danny Gartrell Maraj Sr. Sep 09, 1950 [id number]   Medication Adherence call to Danny [name] [facility] Identifiers Verify spoke with patient he is past due on Lisinopril 2.5 mg patient explain he is taking 1 tablet daily and has medication at this time patient will order when due. Danny [name] is showing past due under [name].  [facility] Direct Dial [phone]  Fax [phone] [name]

## 2019-02-23 ENCOUNTER — Encounter: Payer: Self-pay | Admitting: *Deleted

## 2019-02-24 ENCOUNTER — Other Ambulatory Visit: Payer: Self-pay | Admitting: Family Medicine

## 2019-02-24 DIAGNOSIS — E1165 Type 2 diabetes mellitus with hyperglycemia: Secondary | ICD-10-CM

## 2019-02-24 DIAGNOSIS — IMO0002 Reserved for concepts with insufficient information to code with codable children: Secondary | ICD-10-CM

## 2019-02-27 ENCOUNTER — Other Ambulatory Visit: Payer: Self-pay | Admitting: Family Medicine

## 2019-03-25 ENCOUNTER — Ambulatory Visit: Payer: Medicare Other

## 2019-03-25 ENCOUNTER — Ambulatory Visit: Payer: Medicare Other | Admitting: Family Medicine

## 2019-04-23 ENCOUNTER — Other Ambulatory Visit: Payer: Self-pay | Admitting: Family Medicine

## 2019-04-28 ENCOUNTER — Other Ambulatory Visit: Payer: Self-pay | Admitting: Family Medicine

## 2019-04-28 MED ORDER — ESCITALOPRAM OXALATE 10 MG PO TABS: 10.0000 mg | ORAL_TABLET | Freq: Every day | ORAL | 1 refills | Status: DC

## 2019-04-28 MED ORDER — METFORMIN HCL ER 500 MG PO TB24: 500.0000 mg | ORAL_TABLET | Freq: Two times a day (BID) | ORAL | 3 refills | Status: DC

## 2019-07-23 ENCOUNTER — Other Ambulatory Visit: Payer: Self-pay | Admitting: Family Medicine

## 2019-07-29 ENCOUNTER — Other Ambulatory Visit: Payer: Self-pay

## 2019-07-29 ENCOUNTER — Encounter: Payer: Self-pay | Admitting: Emergency Medicine

## 2019-07-29 ENCOUNTER — Emergency Department: Payer: Medicare Other

## 2019-07-29 ENCOUNTER — Inpatient Hospital Stay
Admission: EM | Admit: 2019-07-29 | Discharge: 2019-08-02 | DRG: 177 | Disposition: A | Discharge status: Home / Self Care | Payer: Medicare Other | Attending: Hospitalist | Admitting: Hospitalist

## 2019-07-29 DIAGNOSIS — Z8249 Family history of ischemic heart disease and other diseases of the circulatory system: Secondary | ICD-10-CM | POA: Diagnosis not present

## 2019-07-29 DIAGNOSIS — Z955 Presence of coronary angioplasty implant and graft: Secondary | ICD-10-CM | POA: Diagnosis not present

## 2019-07-29 DIAGNOSIS — N183 Chronic kidney disease, stage 3 unspecified: Secondary | ICD-10-CM | POA: Diagnosis present

## 2019-07-29 DIAGNOSIS — I5042 Chronic combined systolic (congestive) and diastolic (congestive) heart failure: Secondary | ICD-10-CM | POA: Diagnosis not present

## 2019-07-29 DIAGNOSIS — Z7982 Long term (current) use of aspirin: Secondary | ICD-10-CM

## 2019-07-29 DIAGNOSIS — I130 Hypertensive heart and chronic kidney disease with heart failure and stage 1 through stage 4 chronic kidney disease, or unspecified chronic kidney disease: Secondary | ICD-10-CM | POA: Diagnosis present

## 2019-07-29 DIAGNOSIS — J9601 Acute respiratory failure with hypoxia: Secondary | ICD-10-CM | POA: Diagnosis not present

## 2019-07-29 DIAGNOSIS — I255 Ischemic cardiomyopathy: Secondary | ICD-10-CM | POA: Diagnosis present

## 2019-07-29 DIAGNOSIS — J1289 Other viral pneumonia: Secondary | ICD-10-CM | POA: Diagnosis present

## 2019-07-29 DIAGNOSIS — Z7951 Long term (current) use of inhaled steroids: Secondary | ICD-10-CM

## 2019-07-29 DIAGNOSIS — K219 Gastro-esophageal reflux disease without esophagitis: Secondary | ICD-10-CM | POA: Diagnosis present

## 2019-07-29 DIAGNOSIS — E1165 Type 2 diabetes mellitus with hyperglycemia: Secondary | ICD-10-CM | POA: Diagnosis not present

## 2019-07-29 DIAGNOSIS — E1122 Type 2 diabetes mellitus with diabetic chronic kidney disease: Secondary | ICD-10-CM | POA: Diagnosis present

## 2019-07-29 DIAGNOSIS — E7800 Pure hypercholesterolemia, unspecified: Secondary | ICD-10-CM | POA: Diagnosis present

## 2019-07-29 DIAGNOSIS — T380X5A Adverse effect of glucocorticoids and synthetic analogues, initial encounter: Secondary | ICD-10-CM | POA: Diagnosis not present

## 2019-07-29 DIAGNOSIS — Z23 Encounter for immunization: Secondary | ICD-10-CM | POA: Diagnosis present

## 2019-07-29 DIAGNOSIS — U071 COVID-19: Secondary | ICD-10-CM | POA: Diagnosis not present

## 2019-07-29 DIAGNOSIS — Z794 Long term (current) use of insulin: Secondary | ICD-10-CM | POA: Diagnosis not present

## 2019-07-29 DIAGNOSIS — E785 Hyperlipidemia, unspecified: Secondary | ICD-10-CM | POA: Diagnosis present

## 2019-07-29 DIAGNOSIS — Z87891 Personal history of nicotine dependence: Secondary | ICD-10-CM | POA: Diagnosis not present

## 2019-07-29 DIAGNOSIS — I2510 Atherosclerotic heart disease of native coronary artery without angina pectoris: Secondary | ICD-10-CM | POA: Diagnosis present

## 2019-07-29 DIAGNOSIS — Z7902 Long term (current) use of antithrombotics/antiplatelets: Secondary | ICD-10-CM

## 2019-07-29 DIAGNOSIS — F39 Unspecified mood [affective] disorder: Secondary | ICD-10-CM | POA: Diagnosis present

## 2019-07-29 DIAGNOSIS — Z6831 Body mass index (BMI) 31.0-31.9, adult: Secondary | ICD-10-CM

## 2019-07-29 DIAGNOSIS — E1140 Type 2 diabetes mellitus with diabetic neuropathy, unspecified: Secondary | ICD-10-CM | POA: Diagnosis not present

## 2019-07-29 DIAGNOSIS — Z79899 Other long term (current) drug therapy: Secondary | ICD-10-CM | POA: Diagnosis not present

## 2019-07-29 DIAGNOSIS — R000 Tachycardia, unspecified: Secondary | ICD-10-CM | POA: Diagnosis not present

## 2019-07-29 DIAGNOSIS — Y92239 Unspecified place in hospital as the place of occurrence of the external cause: Secondary | ICD-10-CM | POA: Diagnosis not present

## 2019-07-29 DIAGNOSIS — I252 Old myocardial infarction: Secondary | ICD-10-CM

## 2019-07-29 DIAGNOSIS — R0602 Shortness of breath: Secondary | ICD-10-CM | POA: Diagnosis not present

## 2019-07-29 DIAGNOSIS — J432 Centrilobular emphysema: Secondary | ICD-10-CM | POA: Diagnosis present

## 2019-07-29 DIAGNOSIS — Z79891 Long term (current) use of opiate analgesic: Secondary | ICD-10-CM | POA: Diagnosis not present

## 2019-07-29 LAB — GLUCOSE, CAPILLARY: Glucose-Capillary: 290 mg/dL — ABNORMAL HIGH (ref 70–99)

## 2019-07-29 LAB — CBC
HCT: 50.8 % (ref 39.0–52.0)
Hemoglobin: 16.9 g/dL (ref 13.0–17.0)
MCH: 30.8 pg (ref 26.0–34.0)
MCHC: 33.3 g/dL (ref 30.0–36.0)
MCV: 92.5 fL (ref 80.0–100.0)
Platelets: 192 10*3/uL (ref 150–400)
RBC: 5.49 MIL/uL (ref 4.22–5.81)
RDW: 12.2 % (ref 11.5–15.5)
WBC: 5.5 10*3/uL (ref 4.0–10.5)
nRBC: 0 % (ref 0.0–0.2)

## 2019-07-29 LAB — TROPONIN I (HIGH SENSITIVITY)
Troponin I (High Sensitivity): 77 ng/L — ABNORMAL HIGH (ref <18)
Troponin I (High Sensitivity): 76 ng/L — ABNORMAL HIGH (ref <18)

## 2019-07-29 LAB — LACTATE DEHYDROGENASE: LDH: 181 U/L (ref 98–192)

## 2019-07-29 LAB — LACTIC ACID, PLASMA
Lactic Acid, Venous: 1.7 mmol/L (ref 0.5–1.9)
Lactic Acid, Venous: 2.4 mmol/L (ref 0.5–1.9)

## 2019-07-29 LAB — BASIC METABOLIC PANEL
Anion gap: 12 (ref 5–15)
BUN: 25 mg/dL — ABNORMAL HIGH (ref 8–23)
CO2: 23 mmol/L (ref 22–32)
Calcium: 8.8 mg/dL — ABNORMAL LOW (ref 8.9–10.3)
Chloride: 101 mmol/L (ref 98–111)
Creatinine, Ser: 1.35 mg/dL — ABNORMAL HIGH (ref 0.61–1.24)
GFR calc Af Amer: >60 mL/min (ref >60)
GFR calc non Af Amer: 54 mL/min — ABNORMAL LOW (ref >60)
Glucose, Bld: 193 mg/dL — ABNORMAL HIGH (ref 70–99)
Potassium: 3.5 mmol/L (ref 3.5–5.1)
Sodium: 136 mmol/L (ref 135–145)

## 2019-07-29 LAB — FIBRIN DERIVATIVES D-DIMER (ARMC ONLY): Fibrin derivatives D-dimer (ARMC): 555.22 ng/mL (FEU) — ABNORMAL HIGH (ref 0.00–499.00)

## 2019-07-29 LAB — C-REACTIVE PROTEIN: CRP: 1 mg/dL — ABNORMAL HIGH (ref <1.0)

## 2019-07-29 LAB — TRIGLYCERIDES: Triglycerides: 98 mg/dL (ref <150)

## 2019-07-29 LAB — POC SARS CORONAVIRUS 2 AG: SARS Coronavirus 2 Ag: POSITIVE — AB

## 2019-07-29 LAB — PROCALCITONIN: Procalcitonin: <0.1 ng/mL

## 2019-07-29 LAB — FIBRINOGEN: Fibrinogen: 523 mg/dL — ABNORMAL HIGH (ref 210–475)

## 2019-07-29 LAB — BRAIN NATRIURETIC PEPTIDE: B Natriuretic Peptide: 104 pg/mL — ABNORMAL HIGH (ref 0.0–100.0)

## 2019-07-29 LAB — FERRITIN: Ferritin: 80 ng/mL (ref 24–336)

## 2019-07-29 MED ORDER — ACETAMINOPHEN 325 MG PO TABS
650.0000 mg | ORAL_TABLET | Freq: Four times a day (QID) | ORAL | Status: DC | PRN
  Administered 2019-07-30: 650 mg via ORAL
  Filled 2019-07-29: qty 2

## 2019-07-29 MED ORDER — CLOPIDOGREL BISULFATE 75 MG PO TABS
75.0000 mg | ORAL_TABLET | Freq: Every day | ORAL | Status: DC
  Administered 2019-07-30 – 2019-08-02 (×4): 75 mg via ORAL
  Filled 2019-07-29 (×4): qty 1

## 2019-07-29 MED ORDER — ESCITALOPRAM OXALATE 10 MG PO TABS: 10.0000 mg | ORAL_TABLET | Freq: Every day | ORAL | Status: DC

## 2019-07-29 MED ORDER — GUAIFENESIN-DM 100-10 MG/5ML PO SYRP
10.0000 mL | ORAL_SOLUTION | ORAL | Status: DC | PRN
  Filled 2019-07-29: qty 10

## 2019-07-29 MED ORDER — ZINC SULFATE 220 (50 ZN) MG PO CAPS
220.0000 mg | ORAL_CAPSULE | Freq: Every day | ORAL | Status: DC
  Administered 2019-07-30 – 2019-08-02 (×4): 220 mg via ORAL
  Filled 2019-07-29 (×4): qty 1

## 2019-07-29 MED ORDER — INSULIN ASPART 100 UNIT/ML ~~LOC~~ SOLN
0.0000 [IU] | Freq: Every day | SUBCUTANEOUS | Status: DC
  Administered 2019-07-29 – 2019-08-01 (×3): 3 [IU] via SUBCUTANEOUS
  Filled 2019-07-29 (×2): qty 1

## 2019-07-29 MED ORDER — SODIUM CHLORIDE 0.9 % IV SOLN
100.0000 mg | Freq: Every day | INTRAVENOUS | Status: AC
  Administered 2019-07-30 – 2019-08-02 (×4): 100 mg via INTRAVENOUS
  Filled 2019-07-29: qty 100
  Filled 2019-07-29: qty 20
  Filled 2019-07-29: qty 100
  Filled 2019-07-29: qty 20
  Filled 2019-07-29: qty 100

## 2019-07-29 MED ORDER — DEXAMETHASONE SODIUM PHOSPHATE 10 MG/ML IJ SOLN
6.0000 mg | Freq: Once | INTRAMUSCULAR | Status: AC
  Administered 2019-07-29: 6 mg via INTRAVENOUS
  Filled 2019-07-29: qty 1

## 2019-07-29 MED ORDER — ATORVASTATIN CALCIUM 80 MG PO TABS
80.0000 mg | ORAL_TABLET | Freq: Every day | ORAL | Status: DC
  Administered 2019-07-30 – 2019-08-02 (×4): 80 mg via ORAL
  Filled 2019-07-29 (×4): qty 1

## 2019-07-29 MED ORDER — LISINOPRIL 5 MG PO TABS
2.5000 mg | ORAL_TABLET | Freq: Every day | ORAL | Status: DC
  Administered 2019-07-30: 2.5 mg via ORAL
  Filled 2019-07-29: qty 1

## 2019-07-29 MED ORDER — DEXAMETHASONE SODIUM PHOSPHATE 10 MG/ML IJ SOLN
6.0000 mg | INTRAMUSCULAR | Status: DC
  Filled 2019-07-29: qty 0.6

## 2019-07-29 MED ORDER — CLONAZEPAM 0.5 MG PO TABS: 0.2500 mg | ORAL_TABLET | Freq: Two times a day (BID) | ORAL | Status: DC | PRN

## 2019-07-29 MED ORDER — ISOSORBIDE MONONITRATE ER 30 MG PO TB24
30.0000 mg | ORAL_TABLET | Freq: Every day | ORAL | Status: DC
  Administered 2019-07-30 – 2019-08-02 (×4): 30 mg via ORAL
  Filled 2019-07-29 (×4): qty 1

## 2019-07-29 MED ORDER — SODIUM CHLORIDE 0.9 % IV SOLN
200.0000 mg | Freq: Once | INTRAVENOUS | Status: AC
  Administered 2019-07-30: 200 mg via INTRAVENOUS
  Filled 2019-07-29 (×2): qty 40

## 2019-07-29 MED ORDER — ONDANSETRON HCL 4 MG PO TABS: 4.0000 mg | ORAL_TABLET | Freq: Four times a day (QID) | ORAL | Status: DC | PRN

## 2019-07-29 MED ORDER — BISACODYL 10 MG RE SUPP
10.0000 mg | Freq: Every day | RECTAL | Status: DC | PRN
  Filled 2019-07-29: qty 1

## 2019-07-29 MED ORDER — SODIUM CHLORIDE 0.9% FLUSH: 3.0000 mL | Freq: Once | INTRAVENOUS | Status: DC

## 2019-07-29 MED ORDER — CYCLOBENZAPRINE HCL 10 MG PO TABS: 5.0000 mg | ORAL_TABLET | Freq: Three times a day (TID) | ORAL | Status: DC | PRN

## 2019-07-29 MED ORDER — ASPIRIN EC 81 MG PO TBEC
81.0000 mg | DELAYED_RELEASE_TABLET | Freq: Every day | ORAL | Status: DC
  Administered 2019-07-30 – 2019-08-02 (×4): 81 mg via ORAL
  Filled 2019-07-29 (×4): qty 1

## 2019-07-29 MED ORDER — ASCORBIC ACID 500 MG PO TABS
500.0000 mg | ORAL_TABLET | Freq: Every day | ORAL | Status: DC
  Administered 2019-07-30 – 2019-08-02 (×4): 500 mg via ORAL
  Filled 2019-07-29 (×4): qty 1

## 2019-07-29 MED ORDER — SODIUM CHLORIDE 0.9 % IV BOLUS
500.0000 mL | Freq: Once | INTRAVENOUS | Status: AC
  Administered 2019-07-29: 500 mL via INTRAVENOUS

## 2019-07-29 MED ORDER — ONDANSETRON HCL 4 MG/2ML IJ SOLN: 4.0000 mg | Freq: Four times a day (QID) | INTRAMUSCULAR | Status: DC | PRN

## 2019-07-29 MED ORDER — MOMETASONE FURO-FORMOTEROL FUM 200-5 MCG/ACT IN AERO
2.0000 | INHALATION_SPRAY | Freq: Two times a day (BID) | RESPIRATORY_TRACT | Status: DC
  Administered 2019-07-30 – 2019-08-02 (×8): 2 via RESPIRATORY_TRACT
  Filled 2019-07-29: qty 8.8

## 2019-07-29 MED ORDER — TAMSULOSIN HCL 0.4 MG PO CAPS
0.4000 mg | ORAL_CAPSULE | Freq: Every day | ORAL | Status: DC
  Administered 2019-07-30 – 2019-08-02 (×4): 0.4 mg via ORAL
  Filled 2019-07-29 (×4): qty 1

## 2019-07-29 MED ORDER — SENNOSIDES-DOCUSATE SODIUM 8.6-50 MG PO TABS: 1.0000 | ORAL_TABLET | Freq: Every evening | ORAL | Status: DC | PRN

## 2019-07-29 MED ORDER — INSULIN ASPART 100 UNIT/ML ~~LOC~~ SOLN
0.0000 [IU] | Freq: Three times a day (TID) | SUBCUTANEOUS | Status: DC
  Administered 2019-07-30: 3 [IU] via SUBCUTANEOUS
  Administered 2019-07-31: 11 [IU] via SUBCUTANEOUS
  Administered 2019-07-31: 3 [IU] via SUBCUTANEOUS
  Administered 2019-07-31: 11 [IU] via SUBCUTANEOUS
  Administered 2019-08-01: 20 [IU] via SUBCUTANEOUS
  Administered 2019-08-01: 4 [IU] via SUBCUTANEOUS
  Administered 2019-08-01: 13:00:00 15 [IU] via SUBCUTANEOUS
  Administered 2019-08-02: 7 [IU] via SUBCUTANEOUS
  Administered 2019-08-02: 15 [IU] via SUBCUTANEOUS
  Filled 2019-07-29 (×9): qty 1

## 2019-07-29 MED ORDER — CARVEDILOL 12.5 MG PO TABS
12.5000 mg | ORAL_TABLET | Freq: Two times a day (BID) | ORAL | Status: DC
  Administered 2019-07-30 (×2): 12.5 mg via ORAL
  Filled 2019-07-29 (×2): qty 1

## 2019-07-29 MED ORDER — HEPARIN SODIUM (PORCINE) 5000 UNIT/ML IJ SOLN
5000.0000 [IU] | Freq: Three times a day (TID) | INTRAMUSCULAR | Status: DC
  Administered 2019-07-29 – 2019-07-30 (×3): 5000 [IU] via SUBCUTANEOUS
  Filled 2019-07-29 (×3): qty 1

## 2019-07-29 MED ORDER — IPRATROPIUM-ALBUTEROL 20-100 MCG/ACT IN AERS
1.0000 | INHALATION_SPRAY | Freq: Four times a day (QID) | RESPIRATORY_TRACT | Status: DC
  Administered 2019-07-30 – 2019-08-02 (×13): 1 via RESPIRATORY_TRACT
  Filled 2019-07-29 (×2): qty 4

## 2019-07-29 MED ORDER — PREGABALIN 50 MG PO CAPS
50.0000 mg | ORAL_CAPSULE | Freq: Two times a day (BID) | ORAL | Status: DC
  Administered 2019-07-29: 50 mg via ORAL
  Filled 2019-07-29: qty 1

## 2019-07-29 MED ORDER — ACETAMINOPHEN 500 MG PO TABS
1000.0000 mg | ORAL_TABLET | Freq: Once | ORAL | Status: AC
  Administered 2019-07-29: 1000 mg via ORAL
  Filled 2019-07-29: qty 2

## 2019-07-29 NOTE — ED Provider Notes (Signed)
[facility] Emergency Department Provider Note  ____________________________________________   First MD Initiated Contact with Patient 07/29/19 1510     (approximate)  I have reviewed the triage vital signs and the nursing notes.   HISTORY  Chief Complaint Shortness of Breath and Chills    HPI Danny COLARUSSO Sr. is a 68 y.o. male with coronary disease, CHF, COPD, CKD, hypertension, hyperlipidemia, diabetes who comes in with shortness of breath.  Patient has had shortness of breath for 1 to 2 weeks.  The shortness of breath is worth worse with exertion.  The shortness of breath has been worse over the past few days, better at rest, worse with exertion.  He denies a history of blood clots.  Has been taking his Lasix.  Denies any known fevers.  His mother is positive for coronavirus and he reports that she is likely going to pass away from it.          Past Medical History:  Diagnosis Date  . CAD (coronary artery disease)    a. 01/2010 PCI of LAD; b. 09/2014 PCI/DES of mLAD due to ISR. D1 80, D2 80(jailed), LCX 20m c. 07/2016 NSTEMI/Cath: LM nl, LAD 273mSR, 40d, D1 90ost, 80p, D2 90ost, RI min irregs, LCX min irregs, OM1 90 small, OM2/3 min irregs, RCA min irregs, RPLB1 90, EF 25-35%.  . Chronic combined systolic and diastolic CHF (congestive heart failure) (HCEustace   a. 07/2016 Echo: EF 30%, severe septal/anterior HK, Gr1 DD.  . Marland KitchenKD (chronic kidney disease), stage III   . COPD (chronic obstructive pulmonary disease) (HCDoolittle  . DM2 (diabetes mellitus, type 2) (HCCharlotte  . Erectile dysfunction   . HLD (hyperlipidemia)   . HTN (hypertension)   . Ischemic cardiomyopathy    a. 07/2016 Echo: EF 30% w/ sev septal/ant HK. Gr1 DD.    Patient Active Problem List   Diagnosis Date Noted  . Coronary artery disease of native artery of native heart with stable angina pectoris (HCCrook05/03/2019  . Centrilobular emphysema (HCDetmold05/03/2019  . Pure hypercholesterolemia  12/12/2018  . Respiratory illness 10/31/2018  . Chest pain 10/31/2018  . Atherosclerosis of native arteries of extremity with intermittent claudication (HCRio07/04/2018  . Decreased pedal pulses 07/02/2017  . Orthostasis 05/07/2017  . Anxiety and depression 05/07/2017  . CKD (chronic kidney disease) stage 3, GFR 30-59 ml/min 03/06/2017  . Diabetic neuropathy (HCBel Air North08/08/2016  . Chronic combined systolic and diastolic CHF (congestive heart failure) (HCParadise07/31/2018  . Morbid obesity (HCGranite Falls11/13/2017  . H/O medication noncompliance 06/18/2016  . COPD (chronic obstructive pulmonary disease) (HCBrowns Valley11/14/2016  . GERD (gastroesophageal reflux disease) 12/20/2014  . Diabetes mellitus type 2, uncontrolled, with complications ([id number]. Hyperlipidemia 02/10/2010  . Essential hypertension 02/10/2010  . CAD (coronary artery disease) 02/10/2010    Past Surgical History:  Procedure Laterality Date  . CAD: stent to the LAD    . CARDIAC CATHETERIZATION  10/01/2014  . CARDIAC CATHETERIZATION N/A 07/23/2016   Procedure: Left Heart Cath and Coronary Angiography;  Surgeon: MuWellington HampshireMD;  [facility];  Service: Cardiovascular;  Laterality: N/A;  . CORONARY ANGIOPLASTY WITH STENT PLACEMENT  10/01/2014    Prior to Admission medications   Medication Sig Start Date End Date Taking? Authorizing Provider  ACCU-CHEK GUIDE test strip USE TO TEST SUGAR TWICE A DAY 03/02/19   SoLeone HavenMD  acetaminophen (TYLENOL) 325 MG tablet Take 650 mg by mouth  every 6 (six) hours as needed.    [provider]  aspirin 81 MG tablet Take 81 mg by mouth daily.      [provider]  atorvastatin (LIPITOR) 80 MG tablet Take 1 tablet (80 mg total) by mouth daily. 12/12/18   [name], MD  blood glucose meter kit and supplies KIT Dispense based on patient and insurance preference. Check CBGs 4 times daily. Dx code E11.9. 12/19/17   [name], MD   carvedilol (COREG) 12.5 MG tablet Take 1 tablet (12.5 mg total) by mouth 2 (two) times daily with a meal. 12/12/18   Gollan, Kathlene November, MD  clonazePAM (KLONOPIN) 0.5 MG tablet Take 0.5 tablets (0.25 mg total) by mouth 2 (two) times daily as needed for anxiety. 07/02/17   [name], MD  clopidogrel (PLAVIX) 75 MG tablet Take 1 tablet (75 mg total) by mouth daily. 12/12/18   [name], MD  cyclobenzaprine (FLEXERIL) 5 MG tablet Take 1 tablet (5 mg total) by mouth 3 (three) times daily as needed for muscle spasms. 05/22/18   Menshew, Dannielle Karvonen, PA-C  Dulaglutide 1.5 MG/0.5ML SOPN INJECT 1.5 MG INTO THE SKIN ONCE A WEEK 09/18/18   [name], MD  escitalopram (LEXAPRO) 10 MG tablet Take 1 tablet (10 mg total) by mouth daily. 04/28/19   [name], MD  ezetimibe (ZETIA) 10 MG tablet Take 1 tablet (10 mg total) by mouth daily. 12/12/18   [name], MD  furosemide (LASIX) 20 MG tablet Take 1 tablet (20 mg) by mouth once EVERY OTHER DAY 12/12/18   [name], MD  HYDROcodone-acetaminophen (NORCO/VICODIN) 5-325 MG tablet Take 1 tablet by mouth every 4 (four) hours as needed for moderate pain. 01/12/18   Cuthriell, Charline Bills, PA-C  insulin aspart (NOVOLOG FLEXPEN) 100 UNIT/ML FlexPen Inject 8 units under the skin twice daily with meals 04/21/18   Crecencio Mc, MD  Insulin Pen Needle (PEN NEEDLES) 32G X 6 MM MISC 1 each by Does not apply route 2 (two) times daily. 11/21/18   [name], MD  isosorbide mononitrate (IMDUR) 30 MG 24 hr tablet Take 1 tablet (30 mg total) by mouth daily. 12/12/18   [name], MD  JARDIANCE 25 MG TABS tablet TAKE ONE TABLET BY MOUTH EVERY DAY 12/24/18   [name], MD  Lancets Misc. (ACCU-CHEK SOFTCLIX LANCET DEV) KIT Dispense one lancing device to test blood glucose four times daily. Dx code E11.9 01/06/18   [name], MD  lisinopril (ZESTRIL) 2.5 MG tablet Take 1 tablet (2.5 mg total) by mouth daily. 12/12/18    [name], MD  metFORMIN (GLUCOPHAGE XR) 500 MG 24 hr tablet Take 1 tablet (500 mg total) by mouth 2 (two) times daily. 04/28/19   [name], MD  nitroGLYCERIN (NITROSTAT) 0.4 MG SL tablet Place 1 tablet (0.4 mg total) under the tongue every 5 (five) minutes as needed for chest pain. 12/12/18   [name], MD  potassium chloride (MICRO-K) 10 MEQ CR capsule Take 1 tablet (10 meq) by mouth once EVERY OTHER day with lasix (furosemide) 12/12/18   Gollan, Kathlene November, MD  pregabalin (LYRICA) 50 MG capsule TAKE ONE CAPSULE BY MOUTH TWICE A DAY 05/01/18   [name], MD  SYMBICORT 160-4.5 MCG/ACT inhaler TAKE 2 PUFFS BY MOUTH TWICE A DAY 02/24/19   [name], MD  tamsulosin (FLOMAX) 0.4 MG CAPS capsule TAKE 1  CAPSULE BY MOUTH EVERY DAY 07/24/19   [name], MD  TRESIBA FLEXTOUCH 100 UNIT/ML SOPN FlexTouch Pen INJECT 0.45 MLS (45 UNITS TOTAL) INTO THE SKIN DAILY. 02/24/19   [name], MD  VENTOLIN HFA 108 (90 Base) MCG/ACT inhaler Inhale 2 puffs into the lungs every 6 (six) hours as needed for wheezing or shortness of breath. 11/21/18   [name], MD    Allergies Patient has no known allergies.  Family History  Problem Relation Age of Onset  . Heart attack Father        complications  . Cancer Brother     Social History Social History   Tobacco Use  . Smoking status: Former Smoker    Packs/day: 3.00    Years: 0.00    Pack years: 0.00    Types: Cigarettes    Quit date: 09/07/2009    Years since quitting: 9.8  . Smokeless tobacco: Never Used  . Tobacco comment: quit june 2011  Substance Use Topics  . Alcohol use: Yes    Comment: every other weekend  . Drug use: No      Review of Systems Constitutional: No fever/chills Eyes: No visual changes. ENT: No sore throat. Cardiovascular: No chest pain Respiratory: Positive for SOB Gastrointestinal: No abdominal pain.  No nausea, no vomiting.  No diarrhea.  No  constipation. Genitourinary: Negative for dysuria. Musculoskeletal: Negative for back pain. Skin: Negative for rash. Neurological: Negative for headaches, focal weakness or numbness. All other ROS negative ____________________________________________   PHYSICAL EXAM:  VITAL SIGNS: ED Triage Vitals  Enc Vitals Group     BP 07/29/19 1305 (!) 171/117     Pulse Rate 07/29/19 1305 (!) 128     Resp 07/29/19 1305 20     Temp 07/29/19 1305 100 F (37.8 C)     Temp Source 07/29/19 1305 Oral     SpO2 07/29/19 1305 93 %     Weight 07/29/19 1306 250 lb (113.4 kg)     Height 07/29/19 1306 6' (1.829 m)     Head Circumference --      Peak Flow --      Pain Score 07/29/19 1306 0     Pain Loc --      Pain Edu? --      Excl. in [place]? --     Constitutional: Alert and oriented. Well appearing and in no acute distress. Eyes: Conjunctivae are normal. EOMI. Head: Atraumatic. Nose: No congestion/rhinnorhea. Mouth/Throat: Mucous membranes are moist.   Neck: No stridor. Trachea Midline. FROM Cardiovascular: Normal rate, regular rhythm. Grossly normal heart sounds.  Good peripheral circulation. Respiratory: Increased work of breathing especially with ambulation.  Clear lungs Gastrointestinal: Soft and nontender. No distention. No abdominal bruits.  Musculoskeletal: No lower extremity tenderness nor edema.  No joint effusions. Neurologic:  Normal speech and language. No gross focal neurologic deficits are appreciated.  Skin:  Skin is warm, dry and intact. No rash noted. Psychiatric: Mood and affect are normal. Speech and behavior are normal. GU: Deferred   ____________________________________________   LABS (all labs ordered are listed, but only abnormal results are displayed)  Labs Reviewed  BASIC METABOLIC PANEL - Abnormal; Notable for the following components:      Result Value   Glucose, Bld 193 (*)    BUN 25 (*)    Creatinine, Ser 1.35 (*)    Calcium 8.8 (*)    GFR calc non Af Amer  54 (*)    All other  components within normal limits  POC SARS CORONAVIRUS 2 AG - Abnormal; Notable for the following components:   SARS Coronavirus 2 Ag POSITIVE (*)    All other components within normal limits  TROPONIN I (HIGH SENSITIVITY) - Abnormal; Notable for the following components:   Troponin I (High Sensitivity) 77 (*)    All other components within normal limits  CBC  LACTIC ACID, PLASMA  LACTIC ACID, PLASMA  FIBRIN DERIVATIVES D-DIMER ([facility] ONLY)  PROCALCITONIN  LACTATE DEHYDROGENASE  FERRITIN  TRIGLYCERIDES  FIBRINOGEN  C-REACTIVE PROTEIN  TROPONIN I (HIGH SENSITIVITY)   ____________________________________________   ED ECG REPORT I, Vanessa [place], the attending physician, personally viewed and interpreted this ECG.  EKG is sinus tachycardia rate of 131, no ST elevations, T wave inversions in lead V5 and V6 which do look new from prior.  Normal intervals otherwise ____________________________________________  RADIOLOGY Masahiro Bellow, personally viewed and evaluated these images (plain radiographs) as part of my medical decision making, as well as reviewing the written report by the radiologist.  ED MD interpretation: Patient without any focal consolidation.  Official radiology report(s): DG Chest 2 View  Result Date: 07/29/2019 CLINICAL DATA:  Shortness of breath. EXAM: CHEST - 2 VIEW COMPARISON:  02/05/2019 FINDINGS: The heart size and pulmonary vascularity are normal. The patient has severe emphysematous changes bilaterally which are stable. No acute infiltrates or effusions. No bone abnormality. IMPRESSION: No acute abnormalities. Severe emphysema. Emphysema (ICD10-J43.9). Electronically Signed   By: Lorriane Shire M.D.   On: 07/29/2019 13:58    ____________________________________________   PROCEDURES  Procedure(s) performed (including Critical Care):  Procedures   ____________________________________________   INITIAL IMPRESSION / ASSESSMENT  AND PLAN / ED COURSE   Danny Estimable Milazzo Sr. was evaluated in Emergency Department on 07/29/2019 for the symptoms described in the history of present illness. He was evaluated in the context of the global COVID-19 pandemic, which necessitated consideration that the patient might be at risk for infection with the SARS-CoV-2 virus that causes COVID-19. Institutional protocols and algorithms that pertain to the evaluation of patients at risk for COVID-19 are in a state of rapid change based on information released by regulatory bodies including the CDC and federal and state organizations. These policies and algorithms were followed during the patient's care in the ED.     Pt presents with SOB.  Most likely secondary to coronavirus.  Differential includes: PNA-will get xray to evaluation CHF-less likely given no edema in his legs but will get BNP COPD-no wheezing on examination Anemia-CBC to evaluate ACS- will get trops Arrhythmia-Will get EKG and keep on monitor.  COVID- will get testing per algorithm. PE-lower suspicion given no risk factors and other cause more likely  Patient was coronavirus positive.  Kidney function is around baseline at 1.35.  Glucose slightly elevated but no evidence of DKA.  White count is normal.  Troponin slightly elevated at 77 possibly demand secondary to the tachycardia.  Lactate was normal.  Attempted to ambulate patient and sats went down to 90 to 92% and patient had increased work of breathing with respiratory rates in the 40s.  Given this we will discuss to the hospital team for admission give a dose of Decadron.  Patient placed on 2 L of nasal cannula to help with work of breathing   ____________________________________________   FINAL CLINICAL IMPRESSION(S) / ED DIAGNOSES   Final diagnoses:  [id number] virus detected     MEDICATIONS GIVEN DURING THIS VISIT:  Medications  sodium  chloride flush (NS) 0.9 % injection 3 mL (has no administration in time  range)  dexamethasone (DECADRON) injection 6 mg (has no administration in time range)  sodium chloride 0.9 % bolus 500 mL (500 mLs Intravenous New Bag/Given 07/29/19 1540)  acetaminophen (TYLENOL) tablet 1,000 mg (1,000 mg Oral Given 07/29/19 1543)     ED Discharge Orders    None       Note:  This document was prepared using Dragon voice recognition software and may include unintentional dictation errors.   Vanessa Sterling, MD 07/29/19 [phone]

## 2019-07-29 NOTE — ED Notes (Signed)
Report called to tasha rn floor nurse 

## 2019-07-29 NOTE — ED Triage Notes (Signed)
Says he gets short of breath for a week or two.  Says he has had shaking, but denies fever.  Says itis worse with exertion.

## 2019-07-29 NOTE — H&P (Signed)
[facility] History and Physical   Patient: Danny [name] POE:[id number]   PCP: [name], MD DOB: Aug 04, 1951   DOA: 07/29/2019   DOS: 07/29/2019   DOS: the patient was seen and examined on 07/29/2019  Patient coming from: The patient is coming from Home  Chief Complaint: Shortness of breath  HPI: Danny MCGRADY Sr. is a 68 y.o. male with Past medical history of CAD, chronic combined systolic and diastolic CHF, chronic kidney disease stage III, COPD, type II DM, ED, HLD, HTN, morbid obesity. Patient presented with complaints of cough and shortness of breath. Patient has chronic COPD as well as CHF and has chronic shortness of breath ongoing for last 3 to 4 months progressively worsening. Patient also reports orthopnea. No chest pain or chest heaviness. No fever no chills. Has some mild cough without any expectoration. Patient's mother recently diagnosed with Covid and actually has just passed away today. Denies any dizziness or lightheadedness. Patient has not smoked cigarettes since last 8 years but used to smoke in the past. No diarrhea no constipation reported by the patient.  ED Course: Comes with shortness of breath.  At rest tachycardic and tachypneic.  Oxygenation dropped significantly when attempted to ambulate the patient.  Patient was referred for admission for Covid positive test.  At his baseline ambulates without assistance independent for most of his ADL;  manages his medication on his own.  Review of Systems: as mentioned in the history of present illness.  All other systems reviewed and are negative.  Past Medical History:  Diagnosis Date  . CAD (coronary artery disease)    a. 01/2010 PCI of LAD; b. 09/2014 PCI/DES of mLAD due to ISR. D1 80, D2 80(jailed), LCX 52m c. 07/2016 NSTEMI/Cath: LM nl, LAD 247mSR, 40d, D1 90ost, 80p, D2 90ost, RI min irregs, LCX min irregs, OM1 90 small, OM2/3 min irregs, RCA min irregs, RPLB1 90, EF 25-35%.  . Chronic  combined systolic and diastolic CHF (congestive heart failure) ([place]   a. 07/2016 Echo: EF 30%, severe septal/anterior HK, Gr1 DD.  . Marland KitchenKD (chronic kidney disease), stage III   . COPD (chronic obstructive pulmonary disease) ([facility]  . DM2 (diabetes mellitus, type 2) ([facility]  . Erectile dysfunction   . HLD (hyperlipidemia)   . HTN (hypertension)   . Ischemic cardiomyopathy    a. 07/2016 Echo: EF 30% w/ sev septal/ant HK. Gr1 DD.   Past Surgical History:  Procedure Laterality Date  . CAD: stent to the LAD    . CARDIAC CATHETERIZATION  10/01/2014  . CARDIAC CATHETERIZATION N/A 07/23/2016   Procedure: Left Heart Cath and Coronary Angiography;  Surgeon: MuWellington HampshireMD;  [facility];  Service: Cardiovascular;  Laterality: N/A;  . CORONARY ANGIOPLASTY WITH STENT PLACEMENT  10/01/2014   Social History:  reports that he quit smoking about 9 years ago. His smoking use included cigarettes. He smoked 3.00 packs per day for 0.00 years. He has never used smokeless tobacco. He reports current alcohol use. He reports that he does not use drugs.  No Known Allergies  Family history reviewed and not pertinent Family History  Problem Relation Age of Onset  . Heart attack Father        complications  . Cancer Brother      Prior to Admission medications   Medication Sig Start Date End Date Taking? Authorizing Provider  aspirin 81 MG tablet Take 81 mg by mouth daily.  Yes [provider]  atorvastatin (LIPITOR) 80 MG tablet Take 1 tablet (80 mg total) by mouth daily. 12/12/18  Yes [name], MD  carvedilol (COREG) 12.5 MG tablet Take 1 tablet (12.5 mg total) by mouth 2 (two) times daily with a meal. 12/12/18  Yes Gollan, Kathlene November, MD  clopidogrel (PLAVIX) 75 MG tablet Take 1 tablet (75 mg total) by mouth daily. 12/12/18  Yes [name], MD  Dulaglutide 1.5 MG/0.5ML SOPN INJECT 1.5 MG INTO THE SKIN ONCE A WEEK 09/18/18  Yes [name], MD  ezetimibe  (ZETIA) 10 MG tablet Take 1 tablet (10 mg total) by mouth daily. 12/12/18  Yes Gollan, Kathlene November, MD  furosemide (LASIX) 20 MG tablet Take 1 tablet (20 mg) by mouth once EVERY OTHER DAY 12/12/18  Yes Gollan, Kathlene November, MD  isosorbide mononitrate (IMDUR) 30 MG 24 hr tablet Take 1 tablet (30 mg total) by mouth daily. 12/12/18  Yes [name], MD  lisinopril (ZESTRIL) 2.5 MG tablet Take 1 tablet (2.5 mg total) by mouth daily. 12/12/18  Yes [name], MD  metFORMIN (GLUCOPHAGE XR) 500 MG 24 hr tablet Take 1 tablet (500 mg total) by mouth 2 (two) times daily. 04/28/19  Yes [name], MD  potassium chloride (MICRO-K) 10 MEQ CR capsule Take 1 tablet (10 meq) by mouth once EVERY OTHER day with lasix (furosemide) 12/12/18  Yes Gollan, Kathlene November, MD  SYMBICORT 160-4.5 MCG/ACT inhaler TAKE 2 PUFFS BY MOUTH TWICE A DAY 02/24/19  Yes [name], MD  tamsulosin (FLOMAX) 0.4 MG CAPS capsule TAKE 1 CAPSULE BY MOUTH EVERY DAY Patient taking differently: Take 0.4 mg by mouth daily.  07/24/19  Yes [name], MD  TRESIBA FLEXTOUCH 100 UNIT/ML SOPN FlexTouch Pen INJECT 0.45 MLS (45 UNITS TOTAL) INTO THE SKIN DAILY. 02/24/19  Yes [name], MD  ACCU-CHEK GUIDE test strip USE TO TEST SUGAR TWICE A DAY 03/02/19   [name], MD  acetaminophen (TYLENOL) 325 MG tablet Take 650 mg by mouth every 6 (six) hours as needed.    [provider]  blood glucose meter kit and supplies KIT Dispense based on patient and insurance preference. Check CBGs 4 times daily. Dx code E11.9. 12/19/17   [name], MD  clonazePAM (KLONOPIN) 0.5 MG tablet Take 0.5 tablets (0.25 mg total) by mouth 2 (two) times daily as needed for anxiety. Patient not taking: Reported on 07/29/2019 07/02/17   [name], MD  cyclobenzaprine (FLEXERIL) 5 MG tablet Take 1 tablet (5 mg total) by mouth 3 (three) times daily as needed for muscle spasms. Patient not taking: Reported on 07/29/2019 05/22/18    Menshew, Dannielle Karvonen, PA-C  escitalopram (LEXAPRO) 10 MG tablet Take 1 tablet (10 mg total) by mouth daily. Patient not taking: Reported on 07/29/2019 04/28/19   [name], MD  HYDROcodone-acetaminophen (NORCO/VICODIN) 5-325 MG tablet Take 1 tablet by mouth every 4 (four) hours as needed for moderate pain. Patient not taking: Reported on 07/29/2019 01/12/18   Cuthriell, Charline Bills, PA-C  insulin aspart (NOVOLOG FLEXPEN) 100 UNIT/ML FlexPen Inject 8 units under the skin twice daily with meals Patient not taking: Reported on 07/29/2019 04/21/18   Crecencio Mc, MD  Insulin Pen Needle (PEN NEEDLES) 32G X 6 MM MISC 1 each by Does not apply route 2 (two) times daily. 11/21/18   [name], MD  JARDIANCE 25 MG TABS tablet TAKE ONE TABLET BY  MOUTH EVERY DAY Patient not taking: No sig reported 12/24/18   [name], MD  Lancets Misc. (ACCU-CHEK SOFTCLIX LANCET DEV) KIT Dispense one lancing device to test blood glucose four times daily. Dx code E11.9 01/06/18   [name], MD  nitroGLYCERIN (NITROSTAT) 0.4 MG SL tablet Place 1 tablet (0.4 mg total) under the tongue every 5 (five) minutes as needed for chest pain. 12/12/18   [name], MD  pregabalin (LYRICA) 50 MG capsule TAKE ONE CAPSULE BY MOUTH TWICE A DAY Patient not taking: No sig reported 05/01/18   [name], MD  VENTOLIN HFA 108 (90 Base) MCG/ACT inhaler Inhale 2 puffs into the lungs every 6 (six) hours as needed for wheezing or shortness of breath. 11/21/18   [name], MD    Physical Exam: Vitals:   07/29/19 1630 07/29/19 1730 07/29/19 1800 07/29/19 1812  BP: 140/83  (!) 151/96   Pulse: (!) 106 (!) 121 (!) 112 (!) 109  Resp: (!) 29 (!) 22 17 (!) 22  Temp:      TempSrc:      SpO2: 98% 97% 97% 98%  Weight:      Height:        General: alert and oriented to time, place, and person. Appear in moderate distress, affect appropriate Eyes: PERRL, Conjunctiva normal ENT: Oral Mucosa Clear,  moist  Neck: no JVD, no Abnormal Mass Or lumps Cardiovascular: S1 and S2 Present, no Murmur, peripheral pulses symmetrical Respiratory: good respiratory effort, Bilateral Air entry equal and Decreased, no signs of accessory muscle use, bilateral faint crackles, Occasional  wheezes Abdomen: Bowel Sound present, Soft and no tenderness, no hernia Skin: no rashes  Extremities: no Pedal edema, no calf tenderness Neurologic: without any new focal findings Gait not checked due to patient safety concerns  Data Reviewed: I have personally reviewed and interpreted labs, imaging as discussed below.  CBC: Recent Labs  Lab 07/29/19 1326  WBC 5.5  HGB 16.9  HCT 50.8  MCV 92.5  PLT 324   Basic Metabolic Panel: Recent Labs  Lab 07/29/19 1326  NA 136  K 3.5  CL 101  CO2 23  GLUCOSE 193*  BUN 25*  CREATININE 1.35*  CALCIUM 8.8*   GFR: Estimated Creatinine Clearance: 68.1 mL/min (A) (by C-G formula based on SCr of 1.35 mg/dL (H)). Liver Function Tests: No results for input(s): AST, ALT, ALKPHOS, BILITOT, PROT, ALBUMIN in the last 168 hours. No results for input(s): LIPASE, AMYLASE in the last 168 hours. No results for input(s): AMMONIA in the last 168 hours. Coagulation Profile: No results for input(s): INR, PROTIME in the last 168 hours. Cardiac Enzymes: No results for input(s): CKTOTAL, CKMB, CKMBINDEX, TROPONINI in the last 168 hours. BNP (last 3 results) No results for input(s): PROBNP in the last 8760 hours. HbA1C: No results for input(s): HGBA1C in the last 72 hours. CBG: No results for input(s): GLUCAP in the last 168 hours. Lipid Profile: Recent Labs    07/29/19 1530  TRIG 98   Thyroid Function Tests: No results for input(s): TSH, T4TOTAL, FREET4, T3FREE, THYROIDAB in the last 72 hours. Anemia Panel: Recent Labs    07/29/19 1532  FERRITIN 80   Urine analysis:    Component Value Date/Time   COLORURINE YELLOW (A) 04/30/2017 2025   APPEARANCEUR CLEAR (A)  04/30/2017 2025   APPEARANCEUR Clear 11/23/2014 2124   LABSPEC 1.018 04/30/2017 2025   LABSPEC 1.025 11/23/2014 2124   PHURINE 5.0 04/30/2017 2025  GLUCOSEU >=500 (A) 04/30/2017 2025   GLUCOSEU >=500 11/23/2014 2124   HGBUR NEGATIVE 04/30/2017 2025   BILIRUBINUR NEGATIVE 04/30/2017 2025   BILIRUBINUR Negative 11/23/2014 2124   KETONESUR NEGATIVE 04/30/2017 2025   PROTEINUR NEGATIVE 04/30/2017 2025   NITRITE NEGATIVE 04/30/2017 2025   LEUKOCYTESUR NEGATIVE 04/30/2017 2025   LEUKOCYTESUR Negative 11/23/2014 2124    Radiological Exams on Admission: DG Chest 2 View  Result Date: 07/29/2019 CLINICAL DATA:  Shortness of breath. EXAM: CHEST - 2 VIEW COMPARISON:  02/05/2019 FINDINGS: The heart size and pulmonary vascularity are normal. The patient has severe emphysematous changes bilaterally which are stable. No acute infiltrates or effusions. No bone abnormality. IMPRESSION: No acute abnormalities. Severe emphysema. Emphysema (ICD10-J43.9). Electronically Signed   By: Lorriane Shire M.D.   On: 07/29/2019 13:58   EKG: Independently reviewed. nonspecific ST and T waves changes, sinus tachycardia. Echocardiogram: August 2018 EF 55 to 60%  I reviewed all nursing notes, pharmacy notes, vitals, pertinent old records.  Assessment/Plan 1.  Acute COVID-19 Viral illness No results found for: SARSCOV2NAA CXR: hazy bilateral peripheral opacities CT chest: GGO, consolidation Recent Labs    07/29/19 1532  FERRITIN 80  LDH 181    Tmax last 24 hours:  Temp (24hrs), Avg:100 F (37.8 C), Min:100 F (37.8 C), Max:100 F (37.8 C)   Oxygen requirements: On room air at rest, on exertion requires oxygen  Antibiotics: None Diuretics: None, resume Lasix tomorrow Vitamin C and Zinc: Started on 07/29/2019. DVT Prophylaxis: Subcutaneous Heparin  weight based. Remdesivir: Started on 07/29/2019 Steroids: Started on 07/29/2019, Decadron Actemra: CRP currently pending.  We will hold off on off-label  use of Actemra  Prone positioning: Patient encouraged to stay in prone position as much as possible.  PPE During this encounter: Patient Isolation: Airborne + Droplet + Contact HCP PPE: CAPR, gown. gloves Patient PPE: None  The treatment plan and use of medications and known side effects were discussed with patient/family. It was clearly explained that there is no proven definitive treatment for COVID-19 infection yet. Any medications used here are based on case reports/anecdotal data which are not peer-reviewed and has not been studied using randomized control trials.  Complete risks and long-term side effects are unknown, however in the best clinical judgment they seem to be of some clinical benefit rather than medical risks.  Patient/family agree with the treatment plan and want to receive these treatments as indicated.   2.  History of COPD On steroids. Continue Combivent. Monitor for now. Procalcitonin level no antibiotics.  3.  Chronic combined CHF. Morbid obesity. Essential hypertension. CAD. HTN. Continuing aspirin, Lipitor, carvedilol, Plavix, Imdur, lisinopril Holding Lasix for now.  4.  Type 2 diabetes mellitus.  Uncontrolled with neuropathy complication Check hemoglobin A1c. Holding Metformin, Jardiance, Tresiba, dulaglutide. Placing on resistant sliding scale insulin.  5.  Diabetic neuropathy. Continuing Lyrica.  6.  Mood disorder. Continue home regimen.   Nutrition: Cardiac diet and Carb modified diet DVT Prophylaxis: Subcutaneous Lovenox  Advance goals of care discussion: Full code   Consults: none   Family Communication: no family was present at bedside, at the time of interview.  Disposition: Admitted as inpatient, telemetry unit. Likely to be discharged ome, in 3 days.  I have discussed plan of care as described above with RN and patient/family.   Author: Berle Mull, MD [facility] 07/29/2019 7:48 PM   To reach On-call, see care teams  to locate the attending and reach out to them via [email or web address]. If 7PM-7AM, please  contact night-coverage If you still have difficulty reaching the attending provider, please page the [facility] (Director on Call) for [facility] on amion for assistance.

## 2019-07-29 NOTE — Consult Note (Signed)
Remdesivir - Pharmacy Brief Note   O:  ALT: N/A CXR: no acute abnormalities SpO2: 80% on room air on arrival   A/P:  Spoke to hospitalist regarding initiation of therapy as CXR was clear and patient's shortness of breath has improved, but tested positive on the Ag test for COVID. Would like to continue therapy and if patient continues to improve, will possibly discharge patient prior to completion of 5 days of therapy.  Remdesivir 200 mg IVPB once followed by 100 mg IVPB daily x 4 days.   Pearla Dubonnet, PharmD Clinical Pharmacist 07/29/2019 6:34 PM

## 2019-07-29 NOTE — ED Notes (Signed)
Nurse unable to take report, states will call me back.

## 2019-07-29 NOTE — ED Notes (Signed)
Pt reports sob for 1 month.  States his mother has covid and is dying at [facility].  Denies chest pain. Sinus tach on monitor.  Hx cig smoking.  Pt alert  Speech clear.   md in with pt.

## 2019-07-30 ENCOUNTER — Encounter: Payer: Self-pay | Admitting: Internal Medicine

## 2019-07-30 LAB — MAGNESIUM: Magnesium: 1.9 mg/dL (ref 1.7–2.4)

## 2019-07-30 LAB — COMPREHENSIVE METABOLIC PANEL
ALT: 21 U/L (ref 0–44)
AST: 31 U/L (ref 15–41)
Albumin: 3.7 g/dL (ref 3.5–5.0)
Alkaline Phosphatase: 86 U/L (ref 38–126)
Anion gap: 12 (ref 5–15)
BUN: 23 mg/dL (ref 8–23)
CO2: 24 mmol/L (ref 22–32)
Calcium: 8.4 mg/dL — ABNORMAL LOW (ref 8.9–10.3)
Chloride: 105 mmol/L (ref 98–111)
Creatinine, Ser: 1.21 mg/dL (ref 0.61–1.24)
GFR calc Af Amer: >60 mL/min (ref >60)
GFR calc non Af Amer: >60 mL/min (ref >60)
Glucose, Bld: 136 mg/dL — ABNORMAL HIGH (ref 70–99)
Potassium: 3.5 mmol/L (ref 3.5–5.1)
Sodium: 141 mmol/L (ref 135–145)
Total Bilirubin: 0.5 mg/dL (ref 0.3–1.2)
Total Protein: 6.9 g/dL (ref 6.5–8.1)

## 2019-07-30 LAB — CBC WITH DIFFERENTIAL/PLATELET
Abs Immature Granulocytes: 0.01 10*3/uL (ref 0.00–0.07)
Basophils Absolute: 0 10*3/uL (ref 0.0–0.1)
Basophils Relative: 0 %
Eosinophils Absolute: 0 10*3/uL (ref 0.0–0.5)
Eosinophils Relative: 0 %
HCT: 51.5 % (ref 39.0–52.0)
Hemoglobin: 16.6 g/dL (ref 13.0–17.0)
Immature Granulocytes: 0 %
Lymphocytes Relative: 30 %
Lymphs Abs: 1 10*3/uL (ref 0.7–4.0)
MCH: 30.3 pg (ref 26.0–34.0)
MCHC: 32.2 g/dL (ref 30.0–36.0)
MCV: 94.1 fL (ref 80.0–100.0)
Monocytes Absolute: 0.5 10*3/uL (ref 0.1–1.0)
Monocytes Relative: 17 %
Neutro Abs: 1.7 10*3/uL (ref 1.7–7.7)
Neutrophils Relative %: 53 %
Platelets: 176 10*3/uL (ref 150–400)
RBC: 5.47 MIL/uL (ref 4.22–5.81)
RDW: 12 % (ref 11.5–15.5)
WBC: 3.2 10*3/uL — ABNORMAL LOW (ref 4.0–10.5)
nRBC: 0 % (ref 0.0–0.2)

## 2019-07-30 LAB — HEMOGLOBIN A1C
Hgb A1c MFr Bld: 11.2 % — ABNORMAL HIGH (ref 4.8–5.6)
Mean Plasma Glucose: 274.74 mg/dL

## 2019-07-30 LAB — HIV ANTIBODY (ROUTINE TESTING W REFLEX): HIV Screen 4th Generation wRfx: NONREACTIVE

## 2019-07-30 LAB — FIBRIN DERIVATIVES D-DIMER (ARMC ONLY): Fibrin derivatives D-dimer (ARMC): 506.21 ng/mL (FEU) — ABNORMAL HIGH (ref 0.00–499.00)

## 2019-07-30 LAB — GLUCOSE, CAPILLARY
Glucose-Capillary: 116 mg/dL — ABNORMAL HIGH (ref 70–99)
Glucose-Capillary: 129 mg/dL — ABNORMAL HIGH (ref 70–99)
Glucose-Capillary: 108 mg/dL — ABNORMAL HIGH (ref 70–99)
Glucose-Capillary: 172 mg/dL — ABNORMAL HIGH (ref 70–99)

## 2019-07-30 LAB — LACTIC ACID, PLASMA
Lactic Acid, Venous: 1.3 mmol/L (ref 0.5–1.9)
Lactic Acid, Venous: 1.2 mmol/L (ref 0.5–1.9)

## 2019-07-30 LAB — FERRITIN: Ferritin: 100 ng/mL (ref 24–336)

## 2019-07-30 LAB — C-REACTIVE PROTEIN: CRP: 1.5 mg/dL — ABNORMAL HIGH (ref <1.0)

## 2019-07-30 MED ORDER — ENOXAPARIN SODIUM 40 MG/0.4ML ~~LOC~~ SOLN
40.0000 mg | SUBCUTANEOUS | Status: DC
  Administered 2019-07-30 – 2019-08-01 (×3): 40 mg via SUBCUTANEOUS
  Filled 2019-07-30 (×3): qty 0.4

## 2019-07-30 MED ORDER — TRAZODONE HCL 50 MG PO TABS
50.0000 mg | ORAL_TABLET | Freq: Every evening | ORAL | Status: DC | PRN
  Administered 2019-07-30 – 2019-07-31 (×2): 50 mg via ORAL
  Filled 2019-07-30 (×2): qty 1

## 2019-07-30 MED ORDER — DEXAMETHASONE 4 MG PO TABS
6.0000 mg | ORAL_TABLET | Freq: Every day | ORAL | Status: DC
  Administered 2019-07-30 – 2019-08-02 (×4): 6 mg via ORAL
  Filled 2019-07-30 (×4): qty 1.5

## 2019-07-30 MED ORDER — PNEUMOCOCCAL VAC POLYVALENT 25 MCG/0.5ML IJ INJ
0.5000 mL | INJECTION | INTRAMUSCULAR | Status: AC
  Administered 2019-08-02: 0.5 mL via INTRAMUSCULAR
  Filled 2019-07-30: qty 0.5

## 2019-07-30 MED ORDER — INFLUENZA VAC A&B SA ADJ QUAD 0.5 ML IM PRSY
0.5000 mL | PREFILLED_SYRINGE | INTRAMUSCULAR | Status: AC
  Administered 2019-08-02: 0.5 mL via INTRAMUSCULAR
  Filled 2019-07-30: qty 0.5

## 2019-07-30 MED ORDER — LABETALOL HCL 5 MG/ML IV SOLN
10.0000 mg | INTRAVENOUS | Status: DC | PRN
  Administered 2019-07-30: 10 mg via INTRAVENOUS
  Filled 2019-07-30 (×2): qty 4

## 2019-07-30 NOTE — Progress Notes (Signed)
PROGRESS NOTE    Danny MCPARTLAND Sr.  BMW:[id number] DOB: Jan 21, 1951 DOA: 07/29/2019 PCP: [name], MD    Assessment & Plan:   Active Problems:   Acute hypoxemic respiratory failure due to COVID-19 [facility])    Danny Estimable Guiney Sr. is a 68 y.o. Caucasian male with Past medical history of CAD, chronic combined systolic and diastolic CHF, chronic kidney disease stage III, COPD, type II DM, ED, HLD, HTN, morbid obesity who presented with complaints of cough and shortness of breath.   # Acute hypoxic respiratory failure 2/2 COVID-19 viral pneumonia  --Desat with ambulation, needed 2L O2 with exertion.  CXR: hazy bilateral peripheral opacities.  CT chest: GGO, consolidation.  Procal neg. PLAN: --continue Remdesivir Day 2 --switch to PO decadron 6 mg daily --vit C and Zinc --continue Combivent and Dulera  History of COPD, not in exacerbation No wheezing or cough with increased sputum production. Continue Combivent.  Hx of Chronic combined CHF. Essential hypertension. CAD. HTN. Continuing aspirin, Lipitor, carvedilol, Plavix, Imdur, lisinopril Holding Lasix for now.  Morbid obesity.   Type 2 diabetes mellitus.  Poorly controlled with neuropathy complication hemoglobin U7O 11.2 Holding Metformin, Jardiance, Tresiba, dulaglutide. Placing on resistant sliding scale insulin.  Diabetic neuropathy. Continuing Lyrica.  Mood disorder. Continue home regimen.   DVT prophylaxis: Lovenox SQ Code Status: Full code  Family Communication: Not today Disposition Plan: Home   Subjective and Interval History:  Pt reported feeling better.  Had DOE and some diarrhea.  No fever,chest pain, abdominal pain, N/V, dysuria, increased swelling.  Pt told me his mother passed away yesterday in the same hospital with [place].   Objective: Vitals:   07/29/19 2109 07/30/19 0351 07/30/19 0829 07/30/19 1618  BP: (!) 166/96 (!) 161/92 (!) 183/112 (!) 160/105  Pulse: 94 86 88 (!) 108  Resp:  18 20 18 20   Temp: 98.3 F (36.8 C) 98.3 F (36.8 C) 98.5 F (36.9 C) 98.9 F (37.2 C)  TempSrc: Oral Oral Oral Oral  SpO2: 97% 95% 96% 97%  Weight:      Height:        Intake/Output Summary (Last 24 hours) at 07/30/2019 1701 Last data filed at 07/30/2019 1619 Gross per 24 hour  Intake --  Output 525 ml  Net -525 ml   Filed Weights   07/29/19 1306  Weight: 113.4 kg    Examination:   Constitutional: NAD, AAOx3 HEENT: conjunctivae and lids normal, EOMI CV: RRR no M,R,G. Distal pulses +2.  No cyanosis.  RESP: CTA B/L, normal respiratory effort, on 2L GI: +BS, NTND Extremities: No effusions, edema, or tenderness in BLE SKIN: warm, dry and intact Neuro: II - XII grossly intact.  Sensation intact Psych: Normal mood and affect.  Appropriate judgement and reason   Data Reviewed: I have personally reviewed following labs and imaging studies  CBC: Recent Labs  Lab 07/29/19 1326 07/30/19 0551  WBC 5.5 3.2*  NEUTROABS  --  1.7  HGB 16.9 16.6  HCT 50.8 51.5  MCV 92.5 94.1  PLT 192 536   Basic Metabolic Panel: Recent Labs  Lab 07/29/19 1326 07/30/19 0551  NA 136 141  K 3.5 3.5  CL 101 105  CO2 23 24  GLUCOSE 193* 136*  BUN 25* 23  CREATININE 1.35* 1.21  CALCIUM 8.8* 8.4*  MG  --  1.9   GFR: Estimated Creatinine Clearance: 76 mL/min (by C-G formula based on SCr of 1.21 mg/dL). Liver Function Tests: Recent Labs  Lab 07/30/19 [phone]  AST 31  ALT 21  ALKPHOS 86  BILITOT 0.5  PROT 6.9  ALBUMIN 3.7   No results for input(s): LIPASE, AMYLASE in the last 168 hours. No results for input(s): AMMONIA in the last 168 hours. Coagulation Profile: No results for input(s): INR, PROTIME in the last 168 hours. Cardiac Enzymes: No results for input(s): CKTOTAL, CKMB, CKMBINDEX, TROPONINI in the last 168 hours. BNP (last 3 results) No results for input(s): PROBNP in the last 8760 hours. HbA1C: Recent Labs    07/30/19 0551  HGBA1C 11.2*   CBG: Recent Labs   Lab 07/29/19 2127 07/30/19 0829 07/30/19 1148 07/30/19 1617  GLUCAP 290* 116* 129* 108*   Lipid Profile: Recent Labs    07/29/19 1530  TRIG 98   Thyroid Function Tests: No results for input(s): TSH, T4TOTAL, FREET4, T3FREE, THYROIDAB in the last 72 hours. Anemia Panel: Recent Labs    07/29/19 1532 07/30/19 0551  FERRITIN 80 100   Sepsis Labs: Recent Labs  Lab 07/29/19 1326 07/29/19 1532 07/30/19 0702 07/30/19 1003  PROCALCITON  --  <0.10  --   --   LATICACIDVEN 1.7 2.4* 1.3 1.2    No results found for this or any previous visit (from the past 240 hour(s)).    Radiology Studies: DG Chest 2 View  Result Date: 07/29/2019 CLINICAL DATA:  Shortness of breath. EXAM: CHEST - 2 VIEW COMPARISON:  02/05/2019 FINDINGS: The heart size and pulmonary vascularity are normal. The patient has severe emphysematous changes bilaterally which are stable. No acute infiltrates or effusions. No bone abnormality. IMPRESSION: No acute abnormalities. Severe emphysema. Emphysema (ICD10-J43.9). Electronically Signed   By: Francene Boyers M.D.   On: 07/29/2019 13:58     Scheduled Meds: . vitamin C  500 mg Oral Daily  . aspirin EC  81 mg Oral Daily  . atorvastatin  80 mg Oral Daily  . carvedilol  12.5 mg Oral BID WC  . clopidogrel  75 mg Oral Daily  . dexamethasone (DECADRON) injection  6 mg Intravenous Q24H  . heparin  5,000 Units Subcutaneous Q8H  . [START ON 07/31/2019] influenza vaccine adjuvanted  0.5 mL Intramuscular Tomorrow-1000  . insulin aspart  0-20 Units Subcutaneous TID WC  . insulin aspart  0-5 Units Subcutaneous QHS  . Ipratropium-Albuterol  1 puff Inhalation Q6H  . isosorbide mononitrate  30 mg Oral Daily  . lisinopril  2.5 mg Oral Daily  . mometasone-formoterol  2 puff Inhalation BID  . [START ON 07/31/2019] pneumococcal 23 valent vaccine  0.5 mL Intramuscular Tomorrow-1000  . tamsulosin  0.4 mg Oral Daily  . zinc sulfate  220 mg Oral Daily   Continuous Infusions: .  remdesivir 100 mg in NS 100 mL 100 mg (07/30/19 1057)     LOS: 1 day     Darlin Priestly, MD [facility] If 7PM-7AM, please contact night-coverage 07/30/2019, 5:01 PM

## 2019-07-30 NOTE — Plan of Care (Signed)
  Problem: Education: Goal: Knowledge of General Education information will improve Description: Including pain rating scale, medication(s)/side effects and non-pharmacologic comfort measures Outcome: Progressing   Problem: Health Behavior/Discharge Planning: Goal: Ability to manage health-related needs will improve Outcome: Not Progressing Note: Mother passed away just yesterday. Obviously depressed / saddened today. Will continue to monitor emotional status for the remainder of the shift. Wenda Low [facility]

## 2019-07-30 NOTE — TOC Initial Note (Signed)
Transition of Care (TOC) - Initial/Assessment Note    Patient Details  Name: Danny [name]. MRN: [id number] Date of Birth: [birth year]  Transition of Care [facility]) CM/SW Contact:    Trenton Founds, RN Phone Number: 07/30/2019, 11:44 AM  Clinical Narrative:                RNCM completed high risk assessment. Patient assessed via telephone due to +Covid diagnosis. Patient reports that he typically drives but his son can take him back and forth anywhere he needs to go. Patient is agreeable to setting up appointment with PCP after discharge. Patient reports at this time he feels like he will be able to go back to his home independently. Patient reports that he lived with his mother who very recently passed away while he has been hospitalized. Patient reports he has plenty of resources in the home and is currently unaware of any needs.  Will continue to follow for any further needs.      Expected Discharge Plan: Home/Self Care Barriers to Discharge: Continued Medical Work up   Patient Goals and CMS Choice Patient states their goals for this hospitalization and ongoing recovery are:: to get back home so I can help make my mothers arrangements      Expected Discharge Plan and Services Expected Discharge Plan: Home/Self Care   Discharge Planning Services: CM Consult   Living arrangements for the past 2 months: Single Family Home                                      Prior Living Arrangements/Services Living arrangements for the past 2 months: Single Family Home Lives with:: Parents(mother deceased during hospitalization) Patient language and need for interpreter reviewed:: Yes Do you feel safe going back to the place where you live?: Yes      Need for Family Participation in Patient Care: Yes (Comment) Care giver support system in place?: Yes (comment)   Criminal Activity/Legal Involvement Pertinent to Current Situation/Hospitalization: No - Comment as needed  Activities  of Daily Living Home Assistive Devices/Equipment: None ADL Screening (condition at time of admission) Patient's cognitive ability adequate to safely complete daily activities?: Yes Is the patient deaf or have difficulty hearing?: No Does the patient have difficulty seeing, even when wearing glasses/contacts?: Yes Does the patient have difficulty concentrating, remembering, or making decisions?: No Patient able to express need for assistance with ADLs?: Yes Does the patient have difficulty dressing or bathing?: No Independently performs ADLs?: Yes (appropriate for developmental age) Does the patient have difficulty walking or climbing stairs?: Yes Weakness of Legs: Both Weakness of Arms/Hands: None  Permission Sought/Granted                  Emotional Assessment Appearance:: (Assessed via telephone due to +Covid diagnosis) Attitude/Demeanor/Rapport: Engaged   Orientation: : Oriented to Self, Oriented to Place, Oriented to  Time, Oriented to Situation   Psych Involvement: No (comment)  Admission diagnosis:  Acute hypoxemic respiratory failure due to COVID-19 (HCC) [U07.1, J96.01] COVID-19 virus detected [U07.1] Patient Active Problem List   Diagnosis Date Noted  . Acute hypoxemic respiratory failure due to COVID-19 (HCC) 07/29/2019  . Coronary artery disease of native artery of native heart with stable angina pectoris (HCC) 12/12/2018  . Centrilobular emphysema (HCC) 12/12/2018  . Pure hypercholesterolemia 12/12/2018  . Respiratory illness 10/31/2018  . Chest pain 10/31/2018  . Atherosclerosis of native  arteries of extremity with intermittent claudication ([place]) 02/11/2018  . Decreased pedal pulses 07/02/2017  . Orthostasis 05/07/2017  . Anxiety and depression 05/07/2017  . CKD (chronic kidney disease) stage 3, GFR 30-59 ml/min 03/06/2017  . Diabetic neuropathy ([place]) 03/06/2017  . Chronic combined systolic and diastolic CHF (congestive heart failure) ([place]) 03/05/2017  .  Morbid obesity ([place]) 06/18/2016  . H/O medication noncompliance 06/18/2016  . COPD (chronic obstructive pulmonary disease) ([place]) 06/20/2015  . GERD (gastroesophageal reflux disease) 12/20/2014  . Diabetes mellitus type 2, uncontrolled, with complications ([place]) 58/04/9832  . Hyperlipidemia 02/10/2010  . Essential hypertension 02/10/2010  . CAD (coronary artery disease) 02/10/2010   PCP:  [name], MD Pharmacy:   [facility], [place] [address] Phone: [phone] Fax: [phone]  [id number] - [place], [place] - [address] [place] [address] Phone: [phone] Fax: [phone]     Social Determinants of Health (SDOH) Interventions    Readmission Risk Interventions Readmission Risk Prevention Plan 07/30/2019  Transportation Screening Complete  PCP or Specialist Appt within 5-7 Days Complete  Medication Review (RN CM) Complete  Some recent data might be hidden

## 2019-07-30 NOTE — Progress Notes (Addendum)
Pt BP was at 165/100 but no PRN order for BP control. Notify prime. Will continue to monitor.  Update 2142: Ouma ordered Labetalol IV 10 mg Prn every 4 hours for BP >165. Pt requested something for sleep. Notify prime. Will continue to monitor.  Update 2233: Oum aNP ordered trazodone 50 mg for sleep. Will continue to monitor.

## 2019-07-31 LAB — CBC WITH DIFFERENTIAL/PLATELET
Abs Immature Granulocytes: 0.01 10*3/uL (ref 0.00–0.07)
Basophils Absolute: 0 10*3/uL (ref 0.0–0.1)
Basophils Relative: 0 %
Eosinophils Absolute: 0 10*3/uL (ref 0.0–0.5)
Eosinophils Relative: 0 %
HCT: 49.6 % (ref 39.0–52.0)
Hemoglobin: 16 g/dL (ref 13.0–17.0)
Immature Granulocytes: 0 %
Lymphocytes Relative: 23 %
Lymphs Abs: 0.7 10*3/uL (ref 0.7–4.0)
MCH: 30.4 pg (ref 26.0–34.0)
MCHC: 32.3 g/dL (ref 30.0–36.0)
MCV: 94.3 fL (ref 80.0–100.0)
Monocytes Absolute: 0.2 10*3/uL (ref 0.1–1.0)
Monocytes Relative: 8 %
Neutro Abs: 2 10*3/uL (ref 1.7–7.7)
Neutrophils Relative %: 69 %
Platelets: 141 10*3/uL — ABNORMAL LOW (ref 150–400)
RBC: 5.26 MIL/uL (ref 4.22–5.81)
RDW: 12.2 % (ref 11.5–15.5)
WBC: 2.9 10*3/uL — ABNORMAL LOW (ref 4.0–10.5)
nRBC: 0 % (ref 0.0–0.2)

## 2019-07-31 LAB — COMPREHENSIVE METABOLIC PANEL
ALT: 22 U/L (ref 0–44)
AST: 34 U/L (ref 15–41)
Albumin: 3.2 g/dL — ABNORMAL LOW (ref 3.5–5.0)
Alkaline Phosphatase: 70 U/L (ref 38–126)
Anion gap: 11 (ref 5–15)
BUN: 28 mg/dL — ABNORMAL HIGH (ref 8–23)
CO2: 21 mmol/L — ABNORMAL LOW (ref 22–32)
Calcium: 8.2 mg/dL — ABNORMAL LOW (ref 8.9–10.3)
Chloride: 106 mmol/L (ref 98–111)
Creatinine, Ser: 1.13 mg/dL (ref 0.61–1.24)
GFR calc Af Amer: >60 mL/min (ref >60)
GFR calc non Af Amer: >60 mL/min (ref >60)
Glucose, Bld: 171 mg/dL — ABNORMAL HIGH (ref 70–99)
Potassium: 3.5 mmol/L (ref 3.5–5.1)
Sodium: 138 mmol/L (ref 135–145)
Total Bilirubin: 0.6 mg/dL (ref 0.3–1.2)
Total Protein: 6 g/dL — ABNORMAL LOW (ref 6.5–8.1)

## 2019-07-31 LAB — MAGNESIUM: Magnesium: 1.9 mg/dL (ref 1.7–2.4)

## 2019-07-31 LAB — GLUCOSE, CAPILLARY
Glucose-Capillary: 147 mg/dL — ABNORMAL HIGH (ref 70–99)
Glucose-Capillary: 255 mg/dL — ABNORMAL HIGH (ref 70–99)
Glucose-Capillary: 291 mg/dL — ABNORMAL HIGH (ref 70–99)
Glucose-Capillary: 282 mg/dL — ABNORMAL HIGH (ref 70–99)

## 2019-07-31 LAB — FIBRIN DERIVATIVES D-DIMER (ARMC ONLY): Fibrin derivatives D-dimer (ARMC): 695.78 ng/mL (FEU) — ABNORMAL HIGH (ref 0.00–499.00)

## 2019-07-31 LAB — C-REACTIVE PROTEIN: CRP: 3 mg/dL — ABNORMAL HIGH (ref <1.0)

## 2019-07-31 MED ORDER — CARVEDILOL 25 MG PO TABS
25.0000 mg | ORAL_TABLET | Freq: Two times a day (BID) | ORAL | Status: DC
  Administered 2019-07-31 – 2019-08-02 (×5): 25 mg via ORAL
  Filled 2019-07-31 (×5): qty 1

## 2019-07-31 MED ORDER — LISINOPRIL 10 MG PO TABS
10.0000 mg | ORAL_TABLET | Freq: Every day | ORAL | Status: DC
  Administered 2019-07-31 – 2019-08-02 (×3): 10 mg via ORAL
  Filled 2019-07-31 (×3): qty 1

## 2019-07-31 NOTE — Progress Notes (Signed)
Pt refused bed alarm but was educated about safety.. Will continue to monitor. 

## 2019-07-31 NOTE — Progress Notes (Addendum)
PROGRESS NOTE    Danny [name].  AJO:[id number] DOB: 1951/07/28 DOA: 07/29/2019 PCP: [name], MD    Assessment & Plan:   Active Problems:   Acute hypoxemic respiratory failure due to COVID-19 [facility])    Danny Danny Amaker [name]. is a 68 y.o. Caucasian male with Past medical history of CAD, chronic combined systolic and diastolic CHF, chronic kidney disease stage III, COPD, type II DM, ED, HLD, HTN, morbid obesity who presented with complaints of cough and shortness of breath.   # Acute hypoxic respiratory failure 2/2 COVID-19 viral pneumonia  --Desat with ambulation, needed 2L O2 with exertion.  CXR: hazy bilateral peripheral opacities.  CT chest: GGO, consolidation.  Procal neg. PLAN: --continue Remdesivir Day 3 --continue PO decadron 6 mg daily --vit C and Zinc --continue Combivent and Dulera  History of COPD, not in exacerbation No wheezing or cough with increased sputum production. Continue Combivent.  HTN --BP had been elevated up to 180's, likely due to steroids. --increase home Lisinopril and coreg --continue home Imdur  Hx of Chronic combined CHF. Essential hypertension. CAD. Continuing aspirin, Lipitor, carvedilol, Plavix, Imdur, lisinopril  Morbid obesity.   Type 2 diabetes mellitus.  Poorly controlled with neuropathy complication hemoglobin A1c 11.2 Holding Metformin, Jardiance, Tresiba, dulaglutide. Placing on resistant sliding scale insulin.  Diabetic neuropathy. Continuing Lyrica.  Mood disorder. Continue home regimen.   DVT prophylaxis: Lovenox SQ Code Status: Full code  Family Communication: none today Disposition Plan: Home   Subjective and Interval History:  Pt reported having diarrhea this morning, but otherwise feeling better.  Was able to walk to the bathroom without getting too short of breath.  No chest pain, abdominal pain, N/V, dysuria.     Objective: Vitals:   07/31/19 0317 07/31/19 0345 07/31/19 0504 07/31/19 0822   BP: (!) 143/79   (!) 175/99  Pulse: 80  80 88  Resp: 19   18  Temp: 98.6 F (37 C)   97.8 F (36.6 C)  TempSrc:    Oral  SpO2: 91% 93% 95% 95%  Weight: 110.4 kg     Height:        Intake/Output Summary (Last 24 hours) at 07/31/2019 1436 Last data filed at 07/31/2019 0300 Gross per 24 hour  Intake --  Output 325 ml  Net -325 ml   Filed Weights   07/29/19 1306 07/31/19 0317  Weight: 113.4 kg 110.4 kg    Examination:   Constitutional: NAD, AAOx3 HEENT: conjunctivae and lids normal, EOMI CV: RRR no M,R,G. Distal pulses +2.  No cyanosis.  RESP: CTA B/L, normal respiratory effort, on 2L GI: +BS, NTND, large abdomen Extremities: No effusions, edema, or tenderness in BLE SKIN: warm, dry and intact Neuro: II - XII grossly intact.  Sensation intact Psych: Normal mood and affect.  Appropriate judgement and reason   Data Reviewed: I have personally reviewed following labs and imaging studies  CBC: Recent Labs  Lab 07/29/19 1326 07/30/19 0551 07/31/19 0657  WBC 5.5 3.2* 2.9*  NEUTROABS  --  1.7 2.0  HGB 16.9 16.6 16.0  HCT 50.8 51.5 49.6  MCV 92.5 94.1 94.3  PLT 192 176 141*   Basic Metabolic Panel: Recent Labs  Lab 07/29/19 1326 07/30/19 0551 07/31/19 0657  NA 136 141 138  K 3.5 3.5 3.5  CL 101 105 106  CO2 23 24 21*  GLUCOSE 193* 136* 171*  BUN 25* 23 28*  CREATININE 1.35* 1.21 1.13  CALCIUM 8.8* 8.4* 8.2*  MG  --  1.9 1.9   GFR: Estimated Creatinine Clearance: 80.3 mL/min (by C-G formula based on SCr of 1.13 mg/dL). Liver Function Tests: Recent Labs  Lab 07/30/19 0551 07/31/19 0657  AST 31 34  ALT 21 22  ALKPHOS 86 70  BILITOT 0.5 0.6  PROT 6.9 6.0*  ALBUMIN 3.7 3.2*   No results for input(s): LIPASE, AMYLASE in the last 168 hours. No results for input(s): AMMONIA in the last 168 hours. Coagulation Profile: No results for input(s): INR, PROTIME in the last 168 hours. Cardiac Enzymes: No results for input(s): CKTOTAL, CKMB, CKMBINDEX,  TROPONINI in the last 168 hours. BNP (last 3 results) No results for input(s): PROBNP in the last 8760 hours. HbA1C: Recent Labs    07/30/19 0551  HGBA1C 11.2*   CBG: Recent Labs  Lab 07/30/19 1148 07/30/19 1617 07/30/19 2210 07/31/19 0820 07/31/19 1127  GLUCAP 129* 108* 172* 147* 255*   Lipid Profile: Recent Labs    07/29/19 1530  TRIG 98   Thyroid Function Tests: No results for input(s): TSH, T4TOTAL, FREET4, T3FREE, THYROIDAB in the last 72 hours. Anemia Panel: Recent Labs    07/29/19 1532 07/30/19 0551  FERRITIN 80 100   Sepsis Labs: Recent Labs  Lab 07/29/19 1326 07/29/19 1532 07/30/19 0702 07/30/19 1003  PROCALCITON  --  <0.10  --   --   LATICACIDVEN 1.7 2.4* 1.3 1.2    No results found for this or any previous visit (from the past 240 hour(s)).    Radiology Studies: No results found.   Scheduled Meds: . vitamin C  500 mg Oral Daily  . aspirin EC  81 mg Oral Daily  . atorvastatin  80 mg Oral Daily  . carvedilol  25 mg Oral BID WC  . clopidogrel  75 mg Oral Daily  . dexamethasone  6 mg Oral Daily  . enoxaparin (LOVENOX) injection  40 mg Subcutaneous Q24H  . influenza vaccine adjuvanted  0.5 mL Intramuscular Tomorrow-1000  . insulin aspart  0-20 Units Subcutaneous TID WC  . insulin aspart  0-5 Units Subcutaneous QHS  . Ipratropium-Albuterol  1 puff Inhalation Q6H  . isosorbide mononitrate  30 mg Oral Daily  . lisinopril  10 mg Oral Daily  . mometasone-formoterol  2 puff Inhalation BID  . pneumococcal 23 valent vaccine  0.5 mL Intramuscular Tomorrow-1000  . tamsulosin  0.4 mg Oral Daily  . zinc sulfate  220 mg Oral Daily   Continuous Infusions: . remdesivir 100 mg in NS 100 mL 100 mg (07/31/19 0948)     LOS: 2 days     Enzo Bi, MD [facility] If 7PM-7AM, please contact night-coverage 07/31/2019, 2:36 PM

## 2019-07-31 NOTE — Plan of Care (Signed)
  Problem: Respiratory: Goal: Will maintain a patent airway Outcome: Progressing   Problem: Education: Goal: Knowledge of General Education information will improve Description: Including pain rating scale, medication(s)/side effects and non-pharmacologic comfort measures Outcome: Progressing   

## 2019-07-31 NOTE — Plan of Care (Signed)
  Problem: Education: Goal: Knowledge of General Education information will improve Description: Including pain rating scale, medication(s)/side effects and non-pharmacologic comfort measures Outcome: Progressing   Problem: Health Behavior/Discharge Planning: Goal: Ability to manage health-related needs will improve Outcome: Progressing   Problem: Clinical Measurements: Goal: Ability to maintain clinical measurements within normal limits will improve Outcome: Not Progressing Note: Patient's BUN and WBC's worsening. BUN now 28 and WBC's < 3. Will continue to monitor lab values for the remainder of the shift. Wenda Low [facility]

## 2019-07-31 NOTE — Plan of Care (Signed)
  Problem: Coping: Goal: Psychosocial and spiritual needs will be supported Outcome: Progressing   Problem: Education: Goal: Knowledge of risk factors and measures for prevention of condition will improve Outcome: Progressing   Problem: Respiratory: Goal: Will maintain a patent airway Outcome: Progressing

## 2019-08-01 LAB — GLUCOSE, CAPILLARY
Glucose-Capillary: 184 mg/dL — ABNORMAL HIGH (ref 70–99)
Glucose-Capillary: 305 mg/dL — ABNORMAL HIGH (ref 70–99)
Glucose-Capillary: 379 mg/dL — ABNORMAL HIGH (ref 70–99)
Glucose-Capillary: 266 mg/dL — ABNORMAL HIGH (ref 70–99)

## 2019-08-01 LAB — COMPREHENSIVE METABOLIC PANEL
ALT: 26 U/L (ref 0–44)
AST: 33 U/L (ref 15–41)
Albumin: 3.2 g/dL — ABNORMAL LOW (ref 3.5–5.0)
Alkaline Phosphatase: 68 U/L (ref 38–126)
Anion gap: 9 (ref 5–15)
BUN: 44 mg/dL — ABNORMAL HIGH (ref 8–23)
CO2: 26 mmol/L (ref 22–32)
Calcium: 8.6 mg/dL — ABNORMAL LOW (ref 8.9–10.3)
Chloride: 105 mmol/L (ref 98–111)
Creatinine, Ser: 1.34 mg/dL — ABNORMAL HIGH (ref 0.61–1.24)
GFR calc Af Amer: >60 mL/min (ref >60)
GFR calc non Af Amer: 54 mL/min — ABNORMAL LOW (ref >60)
Glucose, Bld: 206 mg/dL — ABNORMAL HIGH (ref 70–99)
Potassium: 4 mmol/L (ref 3.5–5.1)
Sodium: 140 mmol/L (ref 135–145)
Total Bilirubin: 0.6 mg/dL (ref 0.3–1.2)
Total Protein: 6.2 g/dL — ABNORMAL LOW (ref 6.5–8.1)

## 2019-08-01 LAB — CBC WITH DIFFERENTIAL/PLATELET
Abs Immature Granulocytes: 0.02 10*3/uL (ref 0.00–0.07)
Basophils Absolute: 0 10*3/uL (ref 0.0–0.1)
Basophils Relative: 0 %
Eosinophils Absolute: 0 10*3/uL (ref 0.0–0.5)
Eosinophils Relative: 0 %
HCT: 48.6 % (ref 39.0–52.0)
Hemoglobin: 16.3 g/dL (ref 13.0–17.0)
Immature Granulocytes: 0 %
Lymphocytes Relative: 19 %
Lymphs Abs: 1 10*3/uL (ref 0.7–4.0)
MCH: 30.2 pg (ref 26.0–34.0)
MCHC: 33.5 g/dL (ref 30.0–36.0)
MCV: 90.2 fL (ref 80.0–100.0)
Monocytes Absolute: 0.6 10*3/uL (ref 0.1–1.0)
Monocytes Relative: 10 %
Neutro Abs: 3.7 10*3/uL (ref 1.7–7.7)
Neutrophils Relative %: 71 %
Platelets: 167 10*3/uL (ref 150–400)
RBC: 5.39 MIL/uL (ref 4.22–5.81)
RDW: 12.4 % (ref 11.5–15.5)
WBC: 5.3 10*3/uL (ref 4.0–10.5)
nRBC: 0 % (ref 0.0–0.2)

## 2019-08-01 LAB — FIBRIN DERIVATIVES D-DIMER (ARMC ONLY): Fibrin derivatives D-dimer (ARMC): 460.99 ng/mL (FEU) (ref 0.00–499.00)

## 2019-08-01 LAB — C-REACTIVE PROTEIN: CRP: 2 mg/dL — ABNORMAL HIGH (ref <1.0)

## 2019-08-01 LAB — MAGNESIUM: Magnesium: 2.2 mg/dL (ref 1.7–2.4)

## 2019-08-01 MED ORDER — SODIUM CHLORIDE 0.9 % IV SOLN
INTRAVENOUS | Status: DC | PRN
  Administered 2019-08-01: 250 mL via INTRAVENOUS

## 2019-08-01 NOTE — Progress Notes (Addendum)
This RN received report this morning about patient refusing bed alarm. Patient states he has fallen in the last 6 months when asked that question. Educated patient on safety and the reason for turning the bed alarm on. Patient states he prefer to have it off because he moves around a lot in the bed but will call out when he needs assistance once he gets up. Will continue to monitor.   Ambulated with patient in the room a couple of times on room air, oxygen saturation went down to 88%, upon sitting back down it gradually went up to 92%. Mild shortness of breath. Back up to 94% on room air.

## 2019-08-01 NOTE — Progress Notes (Addendum)
PROGRESS NOTE    Danny DEWOODY Sr.  ZOX:[id number] DOB: 04/09/1951 DOA: 07/29/2019 PCP: [name], MD    Assessment & Plan:   Active Problems:   Acute hypoxemic respiratory failure due to COVID-19 [facility])    Danny Friends Michie Sr. is a 68 y.o. Caucasian male with Past medical history of CAD, chronic combined systolic and diastolic CHF, chronic kidney disease stage III, COPD, type II DM, ED, HLD, HTN, morbid obesity who presented with complaints of cough and shortness of breath.   # Acute hypoxic respiratory failure 2/2 COVID-19 viral pneumonia  --Desat with ambulation, needed 2L O2 with exertion.  CXR: hazy bilateral peripheral opacities.  CT chest: GGO, consolidation.  Procal neg. PLAN: --continue Remdesivir Day 4 --continue PO decadron 6 mg daily --vit C and Zinc --continue Combivent and Dulera  History of COPD, not in exacerbation No wheezing or cough with increased sputum production. Continue Combivent.  HTN --BP had been elevated up to 180's, likely due to steroids. --continue increased Lisinopril and coreg --continue home Imdur  Hx of Chronic combined CHF. Essential hypertension. CAD. Continuing aspirin, Lipitor, carvedilol, Plavix, Imdur, lisinopril  Morbid obesity.   Type 2 diabetes mellitus.  Poorly controlled with neuropathy complication hemoglobin A1c 11.2 Holding Metformin, Jardiance, Tresiba, dulaglutide. Placing on resistant sliding scale insulin.  Diabetic neuropathy. Continuing Lyrica.  Mood disorder. Continue home regimen.   DVT prophylaxis: Lovenox SQ Code Status: Full code  Family Communication: none today Disposition Plan: Home tomorrow   Subjective and Interval History:  Pt reported feeling much better today, said he did well walking a couple of laps in his room, and was only a little short of breath.  Nursing reported desat down to 88% on room air, but went back up to 92% after sitting back down.  No fever, chest pain,  abdominal pain, N/V/D, dysuria.  Eating well, having BM's.   Objective: Vitals:   08/01/19 0423 08/01/19 0442 08/01/19 0818 08/01/19 1024  BP: 110/81 (!) 130/95 (!) 133/93   Pulse: 75  78   Resp: 20  18   Temp: 97.8 F (36.6 C)  97.6 F (36.4 C)   TempSrc: Oral  Oral   SpO2: 98%  97% 96%  Weight: 106.1 kg     Height:        Intake/Output Summary (Last 24 hours) at 08/01/2019 1342 Last data filed at 08/01/2019 0427 Gross per 24 hour  Intake --  Output 600 ml  Net -600 ml   Filed Weights   07/29/19 1306 07/31/19 0317 08/01/19 0423  Weight: 113.4 kg 110.4 kg 106.1 kg    Examination:   Constitutional: NAD, AAOx3 HEENT: conjunctivae and lids normal, EOMI CV: RRR no M,R,G. Distal pulses +2.  No cyanosis.  RESP: CTA B/L, normal respiratory effort, on RA GI: +BS, NTND, large abdomen Extremities: No effusions, edema, or tenderness in BLE SKIN: warm, dry and intact Neuro: II - XII grossly intact.  Sensation intact Psych: Normal mood and affect.  Appropriate judgement and reason   Data Reviewed: I have personally reviewed following labs and imaging studies  CBC: Recent Labs  Lab 07/29/19 1326 07/30/19 0551 07/31/19 0657 08/01/19 0447  WBC 5.5 3.2* 2.9* 5.3  NEUTROABS  --  1.7 2.0 3.7  HGB 16.9 16.6 16.0 16.3  HCT 50.8 51.5 49.6 48.6  MCV 92.5 94.1 94.3 90.2  PLT 192 176 141* 167   Basic Metabolic Panel: Recent Labs  Lab 07/29/19 1326 07/30/19 0551 07/31/19 0657 08/01/19 0447  NA 136 141 138 140  K 3.5 3.5 3.5 4.0  CL 101 105 106 105  CO2 23 24 21* 26  GLUCOSE 193* 136* 171* 206*  BUN 25* 23 28* 44*  CREATININE 1.35* 1.21 1.13 1.34*  CALCIUM 8.8* 8.4* 8.2* 8.6*  MG  --  1.9 1.9 2.2   GFR: Estimated Creatinine Clearance: 66.4 mL/min (A) (by C-G formula based on SCr of 1.34 mg/dL (H)). Liver Function Tests: Recent Labs  Lab 07/30/19 0551 07/31/19 0657 08/01/19 0447  AST 31 34 33  ALT 21 22 26   ALKPHOS 86 70 68  BILITOT 0.5 0.6 0.6  PROT 6.9  6.0* 6.2*  ALBUMIN 3.7 3.2* 3.2*   No results for input(s): LIPASE, AMYLASE in the last 168 hours. No results for input(s): AMMONIA in the last 168 hours. Coagulation Profile: No results for input(s): INR, PROTIME in the last 168 hours. Cardiac Enzymes: No results for input(s): CKTOTAL, CKMB, CKMBINDEX, TROPONINI in the last 168 hours. BNP (last 3 results) No results for input(s): PROBNP in the last 8760 hours. HbA1C: Recent Labs    07/30/19 0551  HGBA1C 11.2*   CBG: Recent Labs  Lab 07/31/19 1127 07/31/19 1712 07/31/19 2105 08/01/19 0817 08/01/19 1204  GLUCAP 255* 291* 282* 184* 305*   Lipid Profile: Recent Labs    07/29/19 1530  TRIG 98   Thyroid Function Tests: No results for input(s): TSH, T4TOTAL, FREET4, T3FREE, THYROIDAB in the last 72 hours. Anemia Panel: Recent Labs    07/29/19 1532 07/30/19 0551  FERRITIN 80 100   Sepsis Labs: Recent Labs  Lab 07/29/19 1326 07/29/19 1532 07/30/19 0702 07/30/19 1003  PROCALCITON  --  <0.10  --   --   LATICACIDVEN 1.7 2.4* 1.3 1.2    No results found for this or any previous visit (from the past 240 hour(s)).    Radiology Studies: No results found.   Scheduled Meds: . vitamin C  500 mg Oral Daily  . aspirin EC  81 mg Oral Daily  . atorvastatin  80 mg Oral Daily  . carvedilol  25 mg Oral BID WC  . clopidogrel  75 mg Oral Daily  . dexamethasone  6 mg Oral Daily  . enoxaparin (LOVENOX) injection  40 mg Subcutaneous Q24H  . influenza vaccine adjuvanted  0.5 mL Intramuscular Tomorrow-1000  . insulin aspart  0-20 Units Subcutaneous TID WC  . insulin aspart  0-5 Units Subcutaneous QHS  . Ipratropium-Albuterol  1 puff Inhalation Q6H  . isosorbide mononitrate  30 mg Oral Daily  . lisinopril  10 mg Oral Daily  . mometasone-formoterol  2 puff Inhalation BID  . pneumococcal 23 valent vaccine  0.5 mL Intramuscular Tomorrow-1000  . tamsulosin  0.4 mg Oral Daily  . zinc sulfate  220 mg Oral Daily   Continuous  Infusions: . sodium chloride 250 mL (08/01/19 1012)  . remdesivir 100 mg in NS 100 mL 100 mg (08/01/19 1014)     LOS: 3 days     Enzo Bi, MD [facility] If 7PM-7AM, please contact night-coverage 08/01/2019, 1:42 PM

## 2019-08-02 DIAGNOSIS — E1165 Type 2 diabetes mellitus with hyperglycemia: Secondary | ICD-10-CM

## 2019-08-02 DIAGNOSIS — Z23 Encounter for immunization: Secondary | ICD-10-CM | POA: Diagnosis not present

## 2019-08-02 LAB — CBC WITH DIFFERENTIAL/PLATELET
Abs Immature Granulocytes: 0.05 10*3/uL (ref 0.00–0.07)
Basophils Absolute: 0 10*3/uL (ref 0.0–0.1)
Basophils Relative: 0 %
Eosinophils Absolute: 0 10*3/uL (ref 0.0–0.5)
Eosinophils Relative: 0 %
HCT: 49 % (ref 39.0–52.0)
Hemoglobin: 16.2 g/dL (ref 13.0–17.0)
Immature Granulocytes: 1 %
Lymphocytes Relative: 14 %
Lymphs Abs: 1.1 10*3/uL (ref 0.7–4.0)
MCH: 30.9 pg (ref 26.0–34.0)
MCHC: 33.1 g/dL (ref 30.0–36.0)
MCV: 93.3 fL (ref 80.0–100.0)
Monocytes Absolute: 0.7 10*3/uL (ref 0.1–1.0)
Monocytes Relative: 8 %
Neutro Abs: 6.4 10*3/uL (ref 1.7–7.7)
Neutrophils Relative %: 77 %
Platelets: 165 10*3/uL (ref 150–400)
RBC: 5.25 MIL/uL (ref 4.22–5.81)
RDW: 12.1 % (ref 11.5–15.5)
WBC: 8.2 10*3/uL (ref 4.0–10.5)
nRBC: 0 % (ref 0.0–0.2)

## 2019-08-02 LAB — COMPREHENSIVE METABOLIC PANEL
ALT: 30 U/L (ref 0–44)
AST: 34 U/L (ref 15–41)
Albumin: 3.4 g/dL — ABNORMAL LOW (ref 3.5–5.0)
Alkaline Phosphatase: 64 U/L (ref 38–126)
Anion gap: 11 (ref 5–15)
BUN: 48 mg/dL — ABNORMAL HIGH (ref 8–23)
CO2: 26 mmol/L (ref 22–32)
Calcium: 8.7 mg/dL — ABNORMAL LOW (ref 8.9–10.3)
Chloride: 103 mmol/L (ref 98–111)
Creatinine, Ser: 1.41 mg/dL — ABNORMAL HIGH (ref 0.61–1.24)
GFR calc Af Amer: 59 mL/min — ABNORMAL LOW (ref >60)
GFR calc non Af Amer: 51 mL/min — ABNORMAL LOW (ref >60)
Glucose, Bld: 184 mg/dL — ABNORMAL HIGH (ref 70–99)
Potassium: 4.3 mmol/L (ref 3.5–5.1)
Sodium: 140 mmol/L (ref 135–145)
Total Bilirubin: 0.5 mg/dL (ref 0.3–1.2)
Total Protein: 6.2 g/dL — ABNORMAL LOW (ref 6.5–8.1)

## 2019-08-02 LAB — GLUCOSE, CAPILLARY
Glucose-Capillary: 211 mg/dL — ABNORMAL HIGH (ref 70–99)
Glucose-Capillary: 310 mg/dL — ABNORMAL HIGH (ref 70–99)

## 2019-08-02 LAB — C-REACTIVE PROTEIN: CRP: 0.9 mg/dL (ref <1.0)

## 2019-08-02 LAB — FIBRIN DERIVATIVES D-DIMER (ARMC ONLY): Fibrin derivatives D-dimer (ARMC): 404.18 ng/mL (FEU) (ref 0.00–499.00)

## 2019-08-02 LAB — MAGNESIUM: Magnesium: 2.2 mg/dL (ref 1.7–2.4)

## 2019-08-02 MED ORDER — INFLUENZA VAC A&B SA ADJ QUAD 0.5 ML IM PRSY: 0.5000 mL | PREFILLED_SYRINGE | INTRAMUSCULAR | 0 refills | Status: AC

## 2019-08-02 MED ORDER — PNEUMOCOCCAL VAC POLYVALENT 25 MCG/0.5ML IJ INJ: 0.5000 mL | INJECTION | INTRAMUSCULAR | 0 refills | Status: AC

## 2019-08-02 NOTE — Progress Notes (Addendum)
PT had 9 beats of Vtach which was reported to [place], NP. No pain or discomfort/distress noted.No new orders at this time.

## 2019-08-02 NOTE — Progress Notes (Signed)
Reviewed discharge instructions with patient and patient verbalized understanding. Patient is being driven home by his son.

## 2019-08-02 NOTE — Discharge Summary (Signed)
Physician Discharge Summary   Danny [name].  male DOB: 02/01/1951  PVV:[id number]  PCP: [name], MD  Admit date: 07/29/2019 Discharge date: 08/02/2019  Admitted From: home Disposition:  Home.  Sister updated prior to discharge, per pt's request.    CODE STATUS: Full code   Discharge Instructions    Diet - low sodium heart healthy   Complete by: As directed    Discharge instructions   Complete by: As directed    You have completed your 5 days of treatment with COVID, and now you don't need supplemental oxygen.  You may still get short of breath while moving about, but it should get better as you recover.  Your diabetes is not under control.  Please take your diabetic regimen as prescribed by your outpatient doctor and follow up.  Dr. Enzo Bi   Increase activity slowly   Complete by: As directed        Hospital Course:  For full details, please see H&P, progress notes, consult notes and ancillary notes.  Briefly,  Danny [name].is a 68 y.o.Caucasian malewith Past medical history ofCAD, chronic combined systolic and diastolic CHF, chronic kidney disease stage III, COPD, type II DM, ED, HLD, HTN, morbid obesity who presented with complaints of cough and shortness of breath.   # Acute hypoxic respiratory failure 2/2 COVID-19 viral pneumonia  On presentation, pt desat with ambulation, needed 2L O2 with exertion.  CXR: hazy bilateral peripheral opacities.  CT chest: GGO, consolidation.  Procal neg.  Pt received 5 days of Remdesivir and steroids.  On the day of discharge, pt was sating well on room air.  With walking, pt oxygen saturation went down to 88%, however, with rest, it recoverd to 92%.  Pt was aware when his breath got short, and could compensate, so pt was safe to discharge home to recover.  History of COPD, not in exacerbation No wheezing, no cough, no increased sputum production.  Continued home Symbicort as Combivent while  inpatient.  HTN BP had been elevated up to 180's, likely due to steroids.  Continued home Imdur.  Pt's Lisinopril and coreg were increased during hospitalization, however, after discharge, pt will go back to his home BP regimen.    Hx of chronic combined CHF Hx of CAD Continued aspirin, Lipitor, carvedilol, Plavix, Imdur, lisinopril.  Morbid obesity  Type 2 diabetes mellitus. Poorly controlled with neuropathy complication hemoglobin L5Q 11.2.  Pt admitted to not taking his diabetic regimen as prescribed, but said he would be able to get his A1c under control if he put his mind to it.  No change in home diabetic regimen.  Continued home Lyrica.  BHP Continued home Flomax.   Discharge Diagnoses:  Active Problems:   Acute hypoxemic respiratory failure due to COVID-19 [facility])    Discharge Instructions:  Allergies as of 08/02/2019   No Known Allergies     Medication List    STOP taking these medications   clonazePAM 0.5 MG tablet Commonly known as: KLONOPIN   cyclobenzaprine 5 MG tablet Commonly known as: FLEXERIL   escitalopram 10 MG tablet Commonly known as: LEXAPRO   HYDROcodone-acetaminophen 5-325 MG tablet Commonly known as: NORCO/VICODIN   pregabalin 50 MG capsule Commonly known as: LYRICA     TAKE these medications   Accu-Chek Guide test strip Generic drug: glucose blood USE TO TEST SUGAR TWICE A DAY   Accu-Chek Softclix Lancet Dev Kit Dispense one lancing device to test blood glucose four  times daily. Dx code E11.9   acetaminophen 325 MG tablet Commonly known as: TYLENOL Take 650 mg by mouth every 6 (six) hours as needed.   aspirin 81 MG tablet Take 81 mg by mouth daily.   atorvastatin 80 MG tablet Commonly known as: LIPITOR Take 1 tablet (80 mg total) by mouth daily.   blood glucose meter kit and supplies Kit Dispense based on patient and insurance preference. Check CBGs 4 times daily. Dx code E11.9.   carvedilol 12.5 MG tablet Commonly  known as: COREG Take 1 tablet (12.5 mg total) by mouth 2 (two) times daily with a meal.   clopidogrel 75 MG tablet Commonly known as: PLAVIX Take 1 tablet (75 mg total) by mouth daily.   Dulaglutide 1.5 MG/0.5ML Sopn INJECT 1.5 MG INTO THE SKIN ONCE A WEEK   ezetimibe 10 MG tablet Commonly known as: Zetia Take 1 tablet (10 mg total) by mouth daily.   furosemide 20 MG tablet Commonly known as: LASIX Take 1 tablet (20 mg) by mouth once EVERY OTHER DAY   insulin aspart 100 UNIT/ML FlexPen Commonly known as: NovoLOG FlexPen Inject 8 units under the skin twice daily with meals   isosorbide mononitrate 30 MG 24 hr tablet Commonly known as: IMDUR Take 1 tablet (30 mg total) by mouth daily.   Jardiance 25 MG Tabs tablet Generic drug: empagliflozin TAKE ONE TABLET BY MOUTH EVERY DAY   lisinopril 2.5 MG tablet Commonly known as: ZESTRIL Take 1 tablet (2.5 mg total) by mouth daily.   metFORMIN 500 MG 24 hr tablet Commonly known as: Glucophage XR Take 1 tablet (500 mg total) by mouth 2 (two) times daily.   nitroGLYCERIN 0.4 MG SL tablet Commonly known as: Nitrostat Place 1 tablet (0.4 mg total) under the tongue every 5 (five) minutes as needed for chest pain.   Pen Needles 32G X 6 MM Misc 1 each by Does not apply route 2 (two) times daily.   potassium chloride 10 MEQ CR capsule Commonly known as: MICRO-K Take 1 tablet (10 meq) by mouth once EVERY OTHER day with lasix (furosemide)   Symbicort 160-4.5 MCG/ACT inhaler Generic drug: budesonide-formoterol TAKE 2 PUFFS BY MOUTH TWICE A DAY   tamsulosin 0.4 MG Caps capsule Commonly known as: FLOMAX TAKE 1 CAPSULE BY MOUTH EVERY DAY   Tresiba FlexTouch 100 UNIT/ML Sopn FlexTouch Pen Generic drug: insulin degludec INJECT 0.45 MLS (45 UNITS TOTAL) INTO THE SKIN DAILY.   Ventolin HFA 108 (90 Base) MCG/ACT inhaler Generic drug: albuterol Inhale 2 puffs into the lungs every 6 (six) hours as needed for wheezing or shortness of  breath.     ASK your doctor about these medications   influenza vaccine adjuvanted 0.5 ML injection Commonly known as: FLUAD Inject 0.5 mLs into the muscle tomorrow at 10 am for 1 dose. Ask about: Should I take this medication?   pneumococcal 23 valent vaccine 25 MCG/0.5ML injection Commonly known as: PNEUMOVAX-23 Inject 0.5 mLs into the muscle tomorrow at 10 am for 1 dose. Ask about: Should I take this medication?       Follow-up Information    [name], MD. Schedule an appointment as soon as possible for a visit in 7 day(s).   Specialty: Family Medicine Why: Please make appointment in 5-7 days.  Contact information: [address] [place] [address] [phone]           No Known Allergies   The results of significant diagnostics from this hospitalization (  including imaging, microbiology, ancillary and laboratory) are listed below for reference.   Consultations:   Procedures/Studies: DG Chest 2 View  Result Date: 07/29/2019 CLINICAL DATA:  Shortness of breath. EXAM: CHEST - 2 VIEW COMPARISON:  02/05/2019 FINDINGS: The heart size and pulmonary vascularity are normal. The patient has severe emphysematous changes bilaterally which are stable. No acute infiltrates or effusions. No bone abnormality. IMPRESSION: No acute abnormalities. Severe emphysema. Emphysema (ICD10-J43.9). Electronically Signed   By: Lorriane Shire M.D.   On: 07/29/2019 13:58      Labs: BNP (last 3 results) Recent Labs    07/29/19 1326  BNP 387.5*   Basic Metabolic Panel: Recent Labs  Lab 07/29/19 1326 07/30/19 0551 07/31/19 0657 08/01/19 0447 08/02/19 0500  NA 136 141 138 140 140  K 3.5 3.5 3.5 4.0 4.3  CL 101 105 106 105 103  CO2 23 24 21* 26 26  GLUCOSE 193* 136* 171* 206* 184*  BUN 25* 23 28* 44* 48*  CREATININE 1.35* 1.21 1.13 1.34* 1.41*  CALCIUM 8.8* 8.4* 8.2* 8.6* 8.7*  MG  --  1.9 1.9 2.2 2.2   Liver Function Tests: Recent Labs  Lab  07/30/19 0551 07/31/19 0657 08/01/19 0447 08/02/19 0500  AST 31 34 33 34  ALT _0 ALKPHOS 86 70 68 64  BILITOT 0.5 0.6 0.6 0.5  PROT 6.9 6.0* 6.2* 6.2*  ALBUMIN 3.7 3.2* 3.2* 3.4*   No results for input(s): LIPASE, AMYLASE in the last 168 hours. No results for input(s): AMMONIA in the last 168 hours. CBC: Recent Labs  Lab 07/29/19 1326 07/30/19 0551 07/31/19 0657 08/01/19 0447 08/02/19 0500  WBC 5.5 3.2* 2.9* 5.3 8.2  NEUTROABS  --  1.7 2.0 3.7 6.4  HGB 16.9 16.6 16.0 16.3 16.2  HCT 50.8 51.5 49.6 48.6 49.0  MCV 92.5 94.1 94.3 90.2 93.3  PLT 192 176 141* 167 165   Cardiac Enzymes: No results for input(s): CKTOTAL, CKMB, CKMBINDEX, TROPONINI in the last 168 hours. BNP: Invalid input(s): POCBNP CBG: Recent Labs  Lab 08/01/19 1204 08/01/19 1637 08/01/19 2103 08/02/19 0745 08/02/19 1148  GLUCAP 305* 379* 266* 211* 310*   D-Dimer No results for input(s): DDIMER in the last 72 hours. Hgb A1c No results for input(s): HGBA1C in the last 72 hours. Lipid Profile No results for input(s): CHOL, HDL, LDLCALC, TRIG, CHOLHDL, LDLDIRECT in the last 72 hours. Thyroid function studies No results for input(s): TSH, T4TOTAL, T3FREE, THYROIDAB in the last 72 hours.  Invalid input(s): FREET3 Anemia work up No results for input(s): VITAMINB12, FOLATE, FERRITIN, TIBC, IRON, RETICCTPCT in the last 72 hours. Urinalysis    Component Value Date/Time   COLORURINE YELLOW (A) 04/30/2017 2025   APPEARANCEUR CLEAR (A) 04/30/2017 2025   APPEARANCEUR Clear 11/23/2014 2124   LABSPEC 1.018 04/30/2017 2025   LABSPEC 1.025 11/23/2014 2124   PHURINE 5.0 04/30/2017 2025   GLUCOSEU >=500 (A) 04/30/2017 2025   GLUCOSEU >=500 11/23/2014 2124   HGBUR NEGATIVE 04/30/2017 2025   BILIRUBINUR NEGATIVE 04/30/2017 2025   BILIRUBINUR Negative 11/23/2014 2124   KETONESUR NEGATIVE 04/30/2017 2025   PROTEINUR NEGATIVE 04/30/2017 2025   NITRITE NEGATIVE 04/30/2017 2025   LEUKOCYTESUR  NEGATIVE 04/30/2017 2025   LEUKOCYTESUR Negative 11/23/2014 2124   Sepsis Labs Invalid input(s): PROCALCITONIN,  WBC,  LACTICIDVEN Microbiology No results found for this or any previous visit (from the past 240 hour(s)).   Total time spend on discharging this patient, including the last patient exam, discussing the  hospital stay, instructions for ongoing care as it relates to all pertinent caregivers, as well as preparing the medical discharge records, prescriptions, and/or referrals as applicable, is 45 minutes.    Enzo Bi, MD  [facility] 08/04/2019, 3:25 PM  If 7PM-7AM, please contact night-coverage

## 2019-08-03 ENCOUNTER — Telehealth: Payer: Self-pay

## 2019-08-03 NOTE — Telephone Encounter (Signed)
Unable to reach patient for transitional care management. No answer, no voicemail. Will follow as appropriate.

## 2019-08-04 NOTE — Telephone Encounter (Signed)
The 11:30 appointment on that day has been taken already. He could be scheduled on 08/11/18 at 3:15 for hospital follow-up.

## 2019-08-04 NOTE — Telephone Encounter (Signed)
Transition Care Management Follow-up Telephone Call   Date discharged? 08/02/19   How have you been since you were released from the hospital? Patient states, I have shortness of breath when I move around too much. I cleaned up the house when I first came home and over did it. I am now resting in bed with head propped up and understand I cannot move around like I used to-just yet. I am taking it really easy, very little to no cough without any expectoration, no fever." Appetite good. BM/voiding without issues. Denies chest pain/heaviness, headache, dizziness, nausea, vomiting, diarrhea.    Do you understand why you were in the hospital? Yes.    Do you understand the discharge instructions? Yes, do not exert any energy. Rest, hydrate follow up with pcp.    Where were you discharged to? Home.    Items Reviewed:  Medications reviewed: Yes, taking all medications as scheduled.   Allergies reviewed: Yes, none new.   Dietary changes reviewed: Yes, low sodium/heart healthy.  Referrals reviewed: Yes.   Functional Questionnaire:   Activities of Daily Living (ADLs):   He states they are independent in the following: Independent in ADLs.  States they require assistance with the following: He does not require assistance with ADLs. Lives with son.    Any transportation issues/concerns?: None at this time.   Any patient concerns? Yes, he would like to consider home oxygen so he can be more active at home.    Confirmed importance and date/time of follow-up visits scheduled Yes, tentatively scheduled 08/06/19 @ 1130 if scheduling allows. Will follow for confirmation.   Provider Appointment booked with Dr. [name], pcp.   Confirmed with patient if condition begins to worsen call PCP or go to the ER.  Patient was given the office number and encouraged to call back with question or concerns.  : Yes.

## 2019-08-05 NOTE — Telephone Encounter (Signed)
Patient made aware and has been scheduled 08/06/19 at 12:00, per previous note.

## 2019-08-06 ENCOUNTER — Other Ambulatory Visit: Payer: Self-pay

## 2019-08-06 ENCOUNTER — Encounter: Payer: Self-pay | Admitting: Family Medicine

## 2019-08-06 ENCOUNTER — Ambulatory Visit (INDEPENDENT_AMBULATORY_CARE_PROVIDER_SITE_OTHER): Payer: Medicare Other | Admitting: Family Medicine

## 2019-08-06 DIAGNOSIS — J9601 Acute respiratory failure with hypoxia: Secondary | ICD-10-CM

## 2019-08-06 DIAGNOSIS — E118 Type 2 diabetes mellitus with unspecified complications: Secondary | ICD-10-CM

## 2019-08-06 DIAGNOSIS — E1165 Type 2 diabetes mellitus with hyperglycemia: Secondary | ICD-10-CM | POA: Diagnosis not present

## 2019-08-06 DIAGNOSIS — U071 COVID-19: Secondary | ICD-10-CM

## 2019-08-06 DIAGNOSIS — IMO0002 Reserved for concepts with insufficient information to code with codable children: Secondary | ICD-10-CM

## 2019-08-06 NOTE — Assessment & Plan Note (Signed)
Very uncontrolled.  I have concerns that he may not be appropriately taking his medications given how poorly controlled his diabetes is.  We will have him meet virtually with our clinical pharmacist to help in management of his diabetes.

## 2019-08-06 NOTE — Assessment & Plan Note (Signed)
The patient has slowly been improving since discharge.  Cough and shortness of breath have been improving though has had recurrence of diarrhea.  Discussed diarrhea could be related to the COVID-19 or him restarting his Metformin.  He will decrease the Metformin to 1 tablet daily to see if that will be beneficial.  Discussed adequate hydration.  Advised that if his lightheadedness worsens or he feels as though he is going to pass out or if he becomes more dehydrated he needs to go to the emergency room for evaluation of fluids.  Discussed if he develops worsening shortness of breath that needs to be evaluated in the ED as well.  Discussed it could take up to a month for him to start to really feel better.  He has been in contact with the health department and they have advised him of his quarantine duration.  He will stick to their advice.

## 2019-08-06 NOTE — Progress Notes (Signed)
Virtual Visit via telephone Note  This visit type was conducted due to national recommendations for restrictions regarding the COVID-19 pandemic (e.g. social distancing).  This format is felt to be most appropriate for this patient at this time.  All issues noted in this document were discussed and addressed.  No physical exam was performed (except for noted visual exam findings with Video Visits).   I connected with Danny [name] today at 12:00 PM EST by a video enabled telemedicine application or telephone and verified that I am speaking with the correct person using two identifiers. Location patient: home Location provider: work  Persons participating in the virtual visit: patient, provider, Jayshawn Colston (son)  I discussed the limitations, risks, security and privacy concerns of performing an evaluation and management service by telephone and the availability of in person appointments. I also discussed with the patient that there may be a patient responsible charge related to this service. The patient expressed understanding and agreed to proceed.  Interactive audio and video telecommunications were attempted between this provider and patient, however failed, due to patient having technical difficulties OR patient did not have access to video capability.  We continued and completed visit with audio only.   Reason for visit: follow-up  HPI: COVID19: Patient was diagnosed with COVID-19.  He had developed cough and shortness of breath and had oxygen desaturation.  He required oxygen by nasal cannula.  Chest x-ray in the hospital revealed bilateral peripheral opacities.  He received remdesivir and steroids for 5 days.  On day of discharge he had adequate oxygen saturation on room air on ambulation.  He notes since coming home his cough has progressively improved.  He has mild shortness of breath that is not worsening.  He notes he is doing pretty good other than having some diarrhea which started  back yesterday.  He notes he had this in the hospital as well.  No blood in his stool.  No abdominal pain.  No vomiting.  No nausea.  He wonders if it is related to his Metformin as he did restart that recently.  He does note some mild lightheadedness though no syncope.  Notes that was occurring in the hospital and improved though restarted today.  He notes he is not drinking very many fluids and his urine is yellow.  Diabetes: Patient is not checking his sugars as he does not know where his meter is.  He is going to look for that today.  He reports he is taking Trulicity, [place] 50 units daily, NovoLog with meals though he cannot give me the units he takes.,  Jardiance, and Metformin.  A1c was very uncontrolled in the hospital.   ROS: See pertinent positives and negatives per HPI.  Past Medical History:  Diagnosis Date  . CAD (coronary artery disease)    a. 01/2010 PCI of LAD; b. 09/2014 PCI/DES of mLAD due to ISR. D1 80, D2 80(jailed), LCX 66m c. 07/2016 NSTEMI/Cath: LM nl, LAD 24mSR, 40d, D1 90ost, 80p, D2 90ost, RI min irregs, LCX min irregs, OM1 90 small, OM2/3 min irregs, RCA min irregs, RPLB1 90, EF 25-35%.  . Chronic combined systolic and diastolic CHF (congestive heart failure) ([facility]   a. 07/2016 Echo: EF 30%, severe septal/anterior HK, Gr1 DD.  . Marland KitchenKD (chronic kidney disease), stage III   . COPD (chronic obstructive pulmonary disease) ([facility]  . DM2 (diabetes mellitus, type 2) ([facility]  . Erectile dysfunction   . HLD (hyperlipidemia)   .  HTN (hypertension)   . Ischemic cardiomyopathy    a. 07/2016 Echo: EF 30% w/ sev septal/ant HK. Gr1 DD.    Past Surgical History:  Procedure Laterality Date  . CAD: stent to the LAD    . CARDIAC CATHETERIZATION  10/01/2014  . CARDIAC CATHETERIZATION N/A 07/23/2016   Procedure: Left Heart Cath and Coronary Angiography;  Surgeon: [name], MD;  Location: [facility];  Service: Cardiovascular;  Laterality: N/A;  . CORONARY  ANGIOPLASTY WITH STENT PLACEMENT  10/01/2014    Family History  Problem Relation Age of Onset  . Heart attack Father        complications  . Cancer Brother     SOCIAL HX: Former smoker   Current Outpatient Medications:  .  ACCU-CHEK GUIDE test strip, USE TO TEST SUGAR TWICE A DAY, Disp: 100 strip, Rfl: 2 .  acetaminophen (TYLENOL) 325 MG tablet, Take 650 mg by mouth every 6 (six) hours as needed., Disp: , Rfl:  .  aspirin 81 MG tablet, Take 81 mg by mouth daily.  , Disp: , Rfl:  .  atorvastatin (LIPITOR) 80 MG tablet, Take 1 tablet (80 mg total) by mouth daily., Disp: 90 tablet, Rfl: 3 .  blood glucose meter kit and supplies KIT, Dispense based on patient and insurance preference. Check CBGs 4 times daily. Dx code E11.9., Disp: 1 each, Rfl: 0 .  carvedilol (COREG) 12.5 MG tablet, Take 1 tablet (12.5 mg total) by mouth 2 (two) times daily with a meal., Disp: 180 tablet, Rfl: 3 .  clopidogrel (PLAVIX) 75 MG tablet, Take 1 tablet (75 mg total) by mouth daily., Disp: 90 tablet, Rfl: 3 .  Dulaglutide 1.5 MG/0.5ML SOPN, INJECT 1.5 MG INTO THE SKIN ONCE A WEEK, Disp: 2 pen, Rfl: 11 .  ezetimibe (ZETIA) 10 MG tablet, Take 1 tablet (10 mg total) by mouth daily., Disp: 90 tablet, Rfl: 3 .  furosemide (LASIX) 20 MG tablet, Take 1 tablet (20 mg) by mouth once EVERY OTHER DAY, Disp: 45 tablet, Rfl: 3 .  insulin aspart (NOVOLOG FLEXPEN) 100 UNIT/ML FlexPen, Inject 8 units under the skin twice daily with meals, Disp: 15 mL, Rfl: 2 .  Insulin Pen Needle (PEN NEEDLES) 32G X 6 MM MISC, 1 each by Does not apply route 2 (two) times daily., Disp: 100 each, Rfl: 11 .  isosorbide mononitrate (IMDUR) 30 MG 24 hr tablet, Take 1 tablet (30 mg total) by mouth daily., Disp: 90 tablet, Rfl: 3 .  JARDIANCE 25 MG TABS tablet, TAKE ONE TABLET BY MOUTH EVERY DAY, Disp: 30 tablet, Rfl: 11 .  Lancets Misc. (ACCU-CHEK SOFTCLIX LANCET DEV) KIT, Dispense one lancing device to test blood glucose four times daily. Dx code E11.9,  Disp: 1 kit, Rfl: 0 .  lisinopril (ZESTRIL) 2.5 MG tablet, Take 1 tablet (2.5 mg total) by mouth daily., Disp: 90 tablet, Rfl: 3 .  metFORMIN (GLUCOPHAGE XR) 500 MG 24 hr tablet, Take 1 tablet (500 mg total) by mouth 2 (two) times daily., Disp: 180 tablet, Rfl: 3 .  nitroGLYCERIN (NITROSTAT) 0.4 MG SL tablet, Place 1 tablet (0.4 mg total) under the tongue every 5 (five) minutes as needed for chest pain., Disp: 25 tablet, Rfl: 3 .  potassium chloride (MICRO-K) 10 MEQ CR capsule, Take 1 tablet (10 meq) by mouth once EVERY OTHER day with lasix (furosemide), Disp: 45 capsule, Rfl: 3 .  SYMBICORT 160-4.5 MCG/ACT inhaler, TAKE 2 PUFFS BY MOUTH TWICE A DAY, Disp: 30.6 Inhaler, Rfl:  1 .  tamsulosin (FLOMAX) 0.4 MG CAPS capsule, TAKE 1 CAPSULE BY MOUTH EVERY DAY (Patient taking differently: Take 0.4 mg by mouth daily. ), Disp: 90 capsule, Rfl: 0 .  TRESIBA FLEXTOUCH 100 UNIT/ML SOPN FlexTouch Pen, INJECT 0.45 MLS (45 UNITS TOTAL) INTO THE SKIN DAILY., Disp: 45 pen, Rfl: 1 .  VENTOLIN HFA 108 (90 Base) MCG/ACT inhaler, Inhale 2 puffs into the lungs every 6 (six) hours as needed for wheezing or shortness of breath., Disp: 1 Inhaler, Rfl: 0  Current Facility-Administered Medications:  .  famotidine (PEPCID) tablet 40 mg, 40 mg, Oral, PRN, Stegmayer, Kimberly A, PA-C .  methylPREDNISolone sodium succinate (SOLU-MEDROL) 130 mg in sodium chloride 0.9 % 50 mL IVPB, 130 mg, Intravenous, PRN, Stegmayer, Janalyn Harder, PA-C  EXAM: This was a telehealth telephone visit and thus no physical exam was completed.  ASSESSMENT AND PLAN:  Discussed the following assessment and plan:  Acute hypoxemic respiratory failure due to COVID-19 [facility]) The patient has slowly been improving since discharge.  Cough and shortness of breath have been improving though has had recurrence of diarrhea.  Discussed diarrhea could be related to the COVID-19 or him restarting his Metformin.  He will decrease the Metformin to 1 tablet daily to see  if that will be beneficial.  Discussed adequate hydration.  Advised that if his lightheadedness worsens or he feels as though he is going to pass out or if he becomes more dehydrated he needs to go to the emergency room for evaluation of fluids.  Discussed if he develops worsening shortness of breath that needs to be evaluated in the ED as well.  Discussed it could take up to a month for him to start to really feel better.  He has been in contact with the health department and they have advised him of his quarantine duration.  He will stick to their advice.  Diabetes mellitus type 2, uncontrolled, with complications ([place]) Very uncontrolled.  I have concerns that he may not be appropriately taking his medications given how poorly controlled his diabetes is.  We will have him meet virtually with our clinical pharmacist to help in management of his diabetes.   Orders Placed This Encounter  Procedures  . Ambulatory referral to Chronic Care Management Services    Referral Priority:   Routine    Referral Type:   Consultation    Referral Reason:   Care Coordination    Number of Visits Requested:   1    No orders of the defined types were placed in this encounter.    I discussed the assessment and treatment plan with the patient. The patient was provided an opportunity to ask questions and all were answered. The patient agreed with the plan and demonstrated an understanding of the instructions.   The patient was advised to call back or seek an in-person evaluation if the symptoms worsen or if the condition fails to improve as anticipated.  I provided 16 minutes of non-face-to-face time during this encounter.   Tommi Rumps, MD

## 2019-08-10 ENCOUNTER — Telehealth: Payer: Self-pay | Admitting: Family Medicine

## 2019-08-10 NOTE — Chronic Care Management (AMB) (Signed)
  Chronic Care Management   Outreach Note  08/10/2019 Name: Danny Kertz [place] Sr. MRN: [id number] DOB: 08/24/1950  Danny Friends Casale Sr. is a 69 y.o. year old male who is a primary care patient of [name], Yehuda Mao, MD. I reached out to [employer] Sr[employer] by phone today in response to a referral sent by Mr. Danny Friends [place] Sr.'s PCP, Dr. Marikay Alar     An unsuccessful telephone outreach was attempted today. The patient was referred to the case management team by for assistance with care management and care coordination.   Follow Up Plan: The care management team will reach out to the patient again over the next 7 days.   Elisha Ponder, LPN Health Advisor, Embedded Care Coordination [facility] ??[email or web address] ??[phone]

## 2019-08-17 NOTE — Chronic Care Management (AMB) (Signed)
  Chronic Care Management   Note  08/17/2019 Name: Kesley Gaffey Krausz Sr. MRN: [id number] DOB: [birth year]  Cristopher Estimable Collyer Sr. is a 69 y.o. year old male who is a primary care patient of [name], [name], MD. I reached out to [employer] Sr[employer] by phone today in response to a referral sent by Mr. Cristopher Estimable Whetstine Sr.'s PCP, Dr. Tommi Rumps     Mr. Cantero was given information about Chronic Care Management services today including:  1. CCM service includes personalized support from designated clinical staff supervised by his physician, including individualized plan of care and coordination with other care providers 2. 24/7 contact phone numbers for assistance for urgent and routine care needs. 3. Service will only be billed when office clinical staff spend 20 minutes or more in a month to coordinate care. 4. Only one practitioner may furnish and bill the service in a calendar month. 5. The patient may stop CCM services at any time (effective at the end of the month) by phone call to the office staff. 6. The patient will be responsible for cost sharing (co-pay) of up to 20% of the service fee (after annual deductible is met).  Patient agreed to services and verbal consent obtained.   Follow up plan: Telephone appointment with care management team member scheduled for:09/17/2019  Glenna Durand, LPN Health Advisor, [place] [facility] ??[email or web address] ??[phone]

## 2019-08-26 ENCOUNTER — Other Ambulatory Visit: Payer: Self-pay | Admitting: Family Medicine

## 2019-08-26 ENCOUNTER — Other Ambulatory Visit: Payer: Self-pay | Admitting: Cardiovascular Disease

## 2019-08-31 ENCOUNTER — Other Ambulatory Visit: Payer: Self-pay | Admitting: Family Medicine

## 2019-09-17 ENCOUNTER — Ambulatory Visit (INDEPENDENT_AMBULATORY_CARE_PROVIDER_SITE_OTHER): Payer: Medicare Other | Admitting: Pharmacist

## 2019-09-17 ENCOUNTER — Other Ambulatory Visit: Payer: Self-pay | Admitting: Pharmacist

## 2019-09-17 DIAGNOSIS — I25118 Atherosclerotic heart disease of native coronary artery with other forms of angina pectoris: Secondary | ICD-10-CM

## 2019-09-17 DIAGNOSIS — E785 Hyperlipidemia, unspecified: Secondary | ICD-10-CM | POA: Diagnosis not present

## 2019-09-17 DIAGNOSIS — E118 Type 2 diabetes mellitus with unspecified complications: Secondary | ICD-10-CM | POA: Diagnosis not present

## 2019-09-17 DIAGNOSIS — E1165 Type 2 diabetes mellitus with hyperglycemia: Secondary | ICD-10-CM

## 2019-09-17 DIAGNOSIS — J432 Centrilobular emphysema: Secondary | ICD-10-CM

## 2019-09-17 DIAGNOSIS — IMO0002 Reserved for concepts with insufficient information to code with codable children: Secondary | ICD-10-CM

## 2019-09-17 MED ORDER — BLOOD GLUCOSE MONITOR KIT: PACK | 0 refills | Status: DC

## 2019-09-17 MED ORDER — LISINOPRIL 2.5 MG PO TABS: 2.5000 mg | ORAL_TABLET | Freq: Every day | ORAL | 3 refills | Status: DC

## 2019-09-17 MED ORDER — ATORVASTATIN CALCIUM 80 MG PO TABS: 80.0000 mg | ORAL_TABLET | Freq: Every day | ORAL | 3 refills | Status: DC

## 2019-09-17 MED ORDER — JARDIANCE 25 MG PO TABS: 25.0000 mg | ORAL_TABLET | Freq: Every day | ORAL | 3 refills | Status: DC

## 2019-09-17 MED ORDER — VENTOLIN HFA 108 (90 BASE) MCG/ACT IN AERS: 2.0000 | INHALATION_SPRAY | Freq: Four times a day (QID) | RESPIRATORY_TRACT | 3 refills | Status: DC | PRN

## 2019-09-17 NOTE — Chronic Care Management (AMB) (Signed)
  Chronic Care Management   Note  09/17/2019 Name: Danny Andreoni Luevano Sr. MRN: [id number] DOB: May 16, 1951  [name] is a 69 y.o. year old male who is a primary care patient of [name], Yehuda Mao, MD. The CCM team was consulted for assistance with chronic disease management and care coordination needs.    Error - typed clopidogrel BID. Meant carvedilol BID. Patient pill list is correct.   Catie Feliz Beam, PharmD, [place], CPP Clinical Pharmacist [facility] [place] [employer] [phone]

## 2019-09-17 NOTE — Chronic Care Management (AMB) (Signed)
Chronic Care Management   Note  09/17/2019 Name: Danny Samford Corprew Sr. MRN: [id number] DOB: 12-15-1950   Subjective:  Danny Estimable Oats Sr. is a 69 y.o. year old male who is a primary care patient of [name], [name], MD. The CCM team was consulted for assistance with chronic disease management and care coordination needs.    Contacted patient for medication management review.   Review of patient status, including review of consultants reports, laboratory and other test data, was performed as part of comprehensive evaluation and provision of chronic care management services.   Objective:  Lab Results  Component Value Date   CREATININE 1.41 (H) 08/02/2019   CREATININE 1.34 (H) 08/01/2019   CREATININE 1.13 07/31/2019    Lab Results  Component Value Date   HGBA1C 11.2 (H) 07/30/2019       Component Value Date/Time   CHOL 164 09/01/2018 1419   TRIG 98 07/29/2019 1530   HDL 44.40 09/01/2018 1419   CHOLHDL 4 09/01/2018 1419   VLDL 28.6 09/01/2018 1419   LDLCALC 91 09/01/2018 1419   LDLDIRECT 64.0 03/20/2018 0950    Clinical ASCVD: Yes  The 10-year ASCVD risk score Danny Bussing DC Jr., et al., 2013) is: 36.8%   Values used to calculate the score:     Age: 89 years     Sex: Male     Is Non-Hispanic African American: No     Diabetic: Yes     Tobacco smoker: No     Systolic Blood Pressure: 010 mmHg     Is BP treated: Yes     HDL Cholesterol: 44.4 mg/dL     Total Cholesterol: 164 mg/dL    BP Readings from Last 3 Encounters:  08/02/19 (!) 145/101  09/01/18 120/76  05/26/18 (!) 162/115    No Known Allergies  Medications Reviewed Today    Reviewed by De Hollingshead, [facility] (Pharmacist) on 09/17/19 at 0954  Med List Status: <None>  Medication Order Taking? Sig Documenting Provider Last Dose Status Informant  ACCU-CHEK GUIDE test strip [id number] Yes USE TO TEST SUGAR TWICE A DAY [name], MD Taking Active Self  acetaminophen (TYLENOL) 325 MG tablet [id number] No Take  650 mg by mouth every 6 (six) hours as needed. [provider] Not Taking Active Self  aspirin 81 MG tablet [id number] Yes Take 81 mg by mouth daily.   [provider] Taking Active Self  atorvastatin (LIPITOR) 80 MG tablet [id number] No Take 1 tablet (80 mg total) by mouth daily.  Patient not taking: Reported on 09/17/2019   [name], MD Not Taking Active Self  blood glucose meter kit and supplies KIT [id number] Yes Dispense based on patient and insurance preference. Check CBGs 4 times daily. Dx code E11.9. [name], MD Taking Active Self  carvedilol (COREG) 12.5 MG tablet [id number] Yes Take 1 tablet (12.5 mg total) by mouth 2 (two) times daily with a meal. Gollan, Kathlene November, MD Taking Active Self  clopidogrel (PLAVIX) 75 MG tablet [id number] Yes Take 1 tablet (75 mg total) by mouth daily. [name], MD Taking Active Self  escitalopram (LEXAPRO) 10 MG tablet [id number] No Take 10 mg by mouth daily. [provider] Not Taking Active   ezetimibe (ZETIA) 10 MG tablet [id number] Yes Take 1 tablet (10 mg total) by mouth daily. [name], MD Taking Active Self  furosemide (LASIX) 20 MG tablet [id number] Yes TAKE 1 TABLET BY MOUTH EVERY OTHER DAY Please schedule office visit  for further refills. Thank you! [name], MD Taking Active   insulin lispro (HUMALOG) 100 UNIT/ML cartridge [id number] Yes Inject 8 Units into the skin 3 (three) times daily with meals. [provider] Taking Active            Med Note Nat Christen Sep 17, 2019  9:51 AM) Using 8 units QPM  Insulin Pen Needle (PEN NEEDLES) 32G X 6 MM MISC [id number] Yes 1 each by Does not apply route 2 (two) times daily. [name], MD Taking Active Self  isosorbide mononitrate (IMDUR) 30 MG 24 hr tablet [id number] Yes Take 1 tablet (30 mg total) by mouth daily. [name], MD Taking Active Self  JARDIANCE 25 MG TABS tablet [id number] No TAKE ONE TABLET BY  MOUTH EVERY DAY  Patient not taking: Reported on 09/17/2019   [name], MD Not Taking Active Self  Lancets Misc. (ACCU-CHEK SOFTCLIX LANCET DEV) KIT [id number] Yes Dispense one lancing device to test blood glucose four times daily. Dx code E11.9 [name], MD Taking Active Self  lisinopril (ZESTRIL) 2.5 MG tablet [id number] Yes Take 1 tablet (2.5 mg total) by mouth daily. [name], MD Taking Active Self  metFORMIN (GLUCOPHAGE XR) 500 MG 24 hr tablet [id number] Yes Take 1 tablet (500 mg total) by mouth 2 (two) times daily. [name], MD Taking Active Self           Med Note Nat Christen Sep 17, 2019  9:45 AM) Taking 1 QAM  nitroGLYCERIN (NITROSTAT) 0.4 MG SL tablet [id number] No Place 1 tablet (0.4 mg total) under the tongue every 5 (five) minutes as needed for chest pain.  Patient not taking: Reported on 09/17/2019   [name], MD Not Taking Active Self  potassium chloride (MICRO-K) 10 MEQ CR capsule [id number] Yes Take 1 tablet (10 meq) by mouth once EVERY OTHER day with lasix (furosemide) [name], MD Taking Active Self           Med Note Nat Christen Sep 17, 2019  9:47 AM)    Danny [name] 160-4.5 MCG/ACT inhaler [id number] Yes TAKE 2 PUFFS BY MOUTH TWICE A DAY [name], MD Taking Active   tamsulosin (FLOMAX) 0.4 MG CAPS capsule [id number] Yes TAKE 1 CAPSULE BY MOUTH EVERY DAY [name], MD Taking Active Self  TRESIBA FLEXTOUCH 100 UNIT/ML SOPN FlexTouch Pen [id number] Yes INJECT 0.45 MLS (45 UNITS TOTAL) INTO THE SKIN DAILY. [name], MD Taking Active Self           Med Note Nat Christen Sep 17, 2019  9:49 AM) 50 units  TRULICITY 1.5 NT/6.1WE SOPN [id number] Yes INJECT 1.5 MG INTO THE SKIN ONCE A WEEK [name], MD Taking Active   VENTOLIN HFA 108 630 401 4954 Base) MCG/ACT inhaler [id number] No Inhale 2 puffs into the lungs every 6 (six) hours as needed for wheezing or shortness of  breath.  Patient not taking: Reported on 09/17/2019   [name], MD Not Taking Active Self           Assessment:   Goals Addressed            This Visit's Progress     Patient Stated   . PharmD "I don't know what I'm doing with my medications" (pt-stated)       Current Barriers:  . Diabetes: uncontrolled; complicated  by chronic medical conditions including CHF, CAD, CKD, most recent A1c 11.2% . Most recent eGFR: 51 mL/min . Current antihyperglycemic regimen: metformin XR 500 mg daily (recently decreased from daily d/t diarrhea), Jardiance 25 mg - not taking; Trulicity 1.5 mg weekly, Tresiba 50 units QAM, Humalog 8 units at bedtime - not with supper; noted that he has an expired box . Reports episodes of dry mouth, but he is unsure if these are hyper or hypoglycemic episodes as he does not have a meter.  . Current meal patterns o Breakfast: 2 eggs and sausage; 1-2 piece; water;  o Lunch: Sandwich, sometimes leftovers; diet coca cola and a pack of nabs o Supper: vegetable soup w/ crackers o Snacks: Banana, sometimes grapes  o Drinks: water or diet coca cola . Current blood glucose readings: NONE, has lost his meter, has not checked in a while  . Cardiovascular risk reduction: o Current hypertensive regimen: Carvedilol 12.5 mg BID- is taking QAM, not taking evening dose; lisinopril 2.5 mg daily - has NOT been taking; furosemide 20 mg QOD + potassium 10 mEq daily (did not realize it was supposed to be every other day) o Current hyperlipidemia regimen: atorvastatin 80 mg daily - has NOT been taking; ezetimibe 10 mg daily  o Current antiplatelet regimen: ASA 81 mg daily, clopidogrel 75 mg daily  . COPD: taking Symbicort 160/4.5 mcg PRN, no albuterol at home.   Pharmacist Clinical Goal(s):  [name] Over the next 90 days, patient will work with PharmD and primary care provider to address optimized medication management  Interventions: . Comprehensive medication review performed,  medication list updated in electronic medical record . Reviewed medication refill needs. Sent refill for atorvastatin, Jardiance, and albuterol rescue inhaler. Sent lisinopril, but then [facility] and [facility]. Want to avoiding renal insult of restarting ACEi + SGLT2 at the same time. Patient has f/u with cardiology in ~1 month, will plan to have him discuss at that time. For now, HOLD on restarting ACE. [name] As I cannot remotely send generic glucometer prescription, will collaborate w/ PCP and office staff to send this. Recommended patient check glucose at least  BID - fasting and 2 hours post supper.  . For diabetes, continue metformin XR 500 mg QAM, Tresiba 50 units daily, Trulicity 1.5 mg weekly, restart Jardiance 25 mg daily. Stop expired Humalog, especially as patient was taking inappropriately at bedtime. Once blood sugars have been evaluated, will discuss increasing GLP1 vs restarting bolus insulin.  . Educated to take clopidogrel BID. Mailing patient a list of medications and dosing to help fill pill box.  . Educated to take Symbicort scheduled BID. Educated on use of rescue inhaler. Sent refill today.   Patient Self Care Activities:  . Patient will check blood glucose BID, document, and provide at future appointments . Patient will focus on medication adherence by utilizing his BID pill box . Patient will take medications as prescribed  Initial goal documentation        Plan: - Scheduled f/u call 10/05/19  Catie Darnelle Maffucci, PharmD, BCACP, CPP Clinical Pharmacist [place] [phone]

## 2019-09-17 NOTE — Patient Instructions (Addendum)
Danny [name],   It was great talking to you today!   For diabetes, please take: metformin XR 500 mg every morning, Jardiance 25 mg every morning, Tresiba 50 units every morning, and Trulicity 1.5 mg once weekly. Please check blood sugars 1) fasting, first thing in the morning and 2) 2 hours after supper. Please write these readings down so that we can review at our next call!  Fill your pill box like this:   Morning: - Metformin XR 500 mg - Jardiance 25 mg - Carvedilol 12.5 mg  - Furosemide 20 mg EVERY OTHER DAY with potassium 10 mEq - Aspirin 81 mg - Clopidogrel 75 mg - Isosorbide 30 mg  Evening: - Carvedilol 12.5 mg - Atorvastatin 80 mg  - Ezetimibe 10 mg  - Tamsulosin 0.4 mg   For now, HOLD on lisinopril until you see Dr. Rockey Situ in March  Please call with any questions!  Catie Darnelle Maffucci, PharmD, [place]  Visit Information  Goals Addressed            This Visit's Progress     Patient Stated   . PharmD "I don't know what I'm doing with my medications" (pt-stated)       Current Barriers:  . Diabetes: uncontrolled; complicated by chronic medical conditions including CHF, CAD, CKD, most recent A1c 11.2% . Most recent eGFR: 51 mL/min . Current antihyperglycemic regimen: metformin XR 500 mg daily (recently decreased from daily d/t diarrhea), Jardiance 25 mg - not taking; Trulicity 1.5 mg weekly, Tresiba 50 units QAM, Humalog 8 units at bedtime - not with supper; noted that he has an expired box . Reports episodes of dry mouth, but he is unsure if these are hyper or hypoglycemic episodes as he does not have a meter.  . Current meal patterns o Breakfast: 2 eggs and sausage; 1-2 piece; water;  o Lunch: Sandwich, sometimes leftovers; diet coca cola and a pack of nabs o Supper: vegetable soup w/ crackers o Snacks: Banana, sometimes grapes  o Drinks: water or diet coca cola . Current blood glucose readings: NONE, has lost his meter, has not checked in a while   . Cardiovascular risk reduction: o Current hypertensive regimen: Carvedilol 12.5 mg BID- is taking QAM, not taking evening dose; lisinopril 2.5 mg daily - has NOT been taking; furosemide 20 mg QOD + potassium 10 mEq daily (did not realize it was supposed to be every other day) o Current hyperlipidemia regimen: atorvastatin 80 mg daily - has NOT been taking; ezetimibe 10 mg daily  o Current antiplatelet regimen: ASA 81 mg daily, clopidogrel 75 mg daily  . COPD: taking Symbicort 160/4.5 mcg PRN, no albuterol at home.   Pharmacist Clinical Goal(s):  [name] Over the next 90 days, patient will work with PharmD and primary care provider to address optimized medication management  Interventions: . Comprehensive medication review performed, medication list updated in electronic medical record . Reviewed medication refill needs. Sent refill for atorvastatin, Jardiance, and albuterol rescue inhaler. Sent lisinopril, but [facility]. Want to avoiding renal insult of restarting ACEi + SGLT2 at the same time. Patient has f/u with cardiology in ~1 month, will plan to have him discuss at that time. For now, HOLD on restarting ACE. [name] As I cannot remotely send generic glucometer prescription, will collaborate w/ PCP and office staff to send this. Recommended patient check glucose at least  BID - fasting and 2 hours post supper.  . For diabetes, continue metformin XR 500  mg QAM, Tresiba 50 units daily, Trulicity 1.5 mg weekly, restart Jardiance 25 mg daily. Stop expired Humalog, especially as patient was taking inappropriately at bedtime. Once blood sugars have been evaluated, will discuss increasing GLP1 vs restarting bolus insulin.  . Educated to take clopidogrel BID. Mailing patient a list of medications and dosing to help fill pill box.  . Educated to take Symbicort scheduled BID. Educated on use of rescue inhaler. Sent refill today.   Patient Self Care Activities:  . Patient will check  blood glucose BID, document, and provide at future appointments . Patient will focus on medication adherence by utilizing his BID pill box . Patient will take medications as prescribed  Initial goal documentation        Print copy of patient instructions provided.   Plan: - Scheduled f/u call 10/05/19  Catie Darnelle Maffucci, PharmD, BCACP, CPP Clinical Pharmacist [place] [phone]

## 2019-09-17 NOTE — Telephone Encounter (Signed)
[place],   Could you please print this glucometer script, have Dr. [name] sign, and fax to [facility]? Thanks!

## 2019-09-17 NOTE — Addendum Note (Signed)
Addended by: [name], Hudsen Fei G on: 09/17/2019 11:16 AM   Modules accepted: Orders

## 2019-09-24 NOTE — Telephone Encounter (Signed)
This was faxed to pharmacy.  Nainika Newlun,cma

## 2019-10-05 ENCOUNTER — Ambulatory Visit (INDEPENDENT_AMBULATORY_CARE_PROVIDER_SITE_OTHER): Payer: Medicare Other | Admitting: Pharmacist

## 2019-10-05 DIAGNOSIS — E118 Type 2 diabetes mellitus with unspecified complications: Secondary | ICD-10-CM

## 2019-10-05 DIAGNOSIS — I10 Essential (primary) hypertension: Secondary | ICD-10-CM | POA: Diagnosis not present

## 2019-10-05 DIAGNOSIS — E1165 Type 2 diabetes mellitus with hyperglycemia: Secondary | ICD-10-CM | POA: Diagnosis not present

## 2019-10-05 DIAGNOSIS — IMO0002 Reserved for concepts with insufficient information to code with codable children: Secondary | ICD-10-CM

## 2019-10-05 NOTE — Patient Instructions (Addendum)
Danny [name],   It was great talking to you today!   For diabetes, please take: metformin XR 500 mg every morning, Jardiance 25 mg every morning, Tresiba 50 units every morning, and Trulicity 1.5 mg once weekly. Please check blood sugars 1) fasting, first thing in the morning and 2) 2 hours after supper. Please write these readings down so that we can review at our next call!  Fill your pill box like this:   Morning: - Metformin XR 500 mg - Jardiance 25 mg - Carvedilol 12.5 mg  - Furosemide 20 mg EVERY OTHER DAY with potassium 10 mEq - Aspirin 81 mg - Clopidogrel 75 mg - Isosorbide 30 mg  Evening: - Carvedilol 12.5 mg - Atorvastatin 80 mg  - Ezetimibe 10 mg  - Tamsulosin 0.4 mg   For now, HOLD on lisinopril until you see Dr. Rockey Situ in March  Please call with any questions!  Catie Darnelle Maffucci, PharmD, [place]  Goals Addressed            This Visit's Progress     Patient Stated   . PharmD "I don't know what I'm doing with my medications" (pt-stated)       Current Barriers:  . Diabetes: uncontrolled; complicated by chronic medical conditions including CHF, CAD, CKD, most recent A1c 11.2% o Since last visit, has been back on Jardiance. Has not picked up a glucometer yet to check blood sugars.  . Most recent eGFR: 51 mL/min . Current antihyperglycemic regimen: metformin XR 500 mg daily (max tolerated d/t diarrhea, Jardiance 25 mg - restarted w/ our last appointment, Trulicity 1.5 mg weekly, Tresiba 50 units QAM . Does note some improvement in polyuria over the past few weeks . Current blood glucose readings: NONE, has not picked up glucometer prescription yet . Cardiovascular risk reduction: o Current hypertensive regimen: Carvedilol 12.5 mg BID; lisinopril 2.5 mg daily - held when restarting Jardiance; cardiology f/u next weekfurosemide 20 mg QOD + potassium 10 mEq daily (did not realize it was supposed to be every other day) o Current hyperlipidemia regimen:  atorvastatin 80 mg daily - restarted 09/17/19; ezetimibe 10 mg daily  o Current antiplatelet regimen: ASA 81 mg daily, clopidogrel 75 mg daily  . COPD: taking Symbicort 160/4.5 mcg PRN, albuterol PRN  Pharmacist Clinical Goal(s):  [name] Over the next 90 days, patient will work with PharmD and primary care provider to address optimized medication management  Interventions: . Patient was not at home (he was driving in [place]) and was unable to fully review medications, but we did verbally those that he could remember, supplemented by outside fill history.  . Patient was concerned that he didn't have Novolog. Reiterated that we want to see blood sugar readings to determine optimal Novolog dosing, prior to refilling. Continue metformin XR 500 mg daily, Jardiance 25 mg daily, Tresiba 50 units daily, Trulicity 1.5 mg weekly for now. [facility]; they note they received the glucometer prescription, but it had not been picked up in time so was placed back on hold. Asked that they fill.  . Called patient, let him know it would be filled today and for him to pick up. Reiterated to check fasting and 2 hour post prandial after largest meal of his day.  . Will message Dr. Rockey Situ to updated that he is NOT taking lisinopril right now and just restarted atorvastatin.  . Patient notes he did not receive the medication list that was mailed after last appointment. Will collaborate w/ office staff to  remail  Patient Self Care Activities:  . Patient will check blood glucose BID, document, and provide at future appointments . Patient will focus on medication adherence by utilizing his BID pill box . Patient will take medications as prescribed  Please see past updates related to this goal by clicking on the "Past Updates" button in the selected goal         Patient verbalizes understanding of instructions provided today.   Plan:  - Scheduled f/u call 11/05/19  Catie Darnelle Maffucci, PharmD, BCACP, CPP Clinical  Pharmacist [place] [phone]

## 2019-10-05 NOTE — Chronic Care Management (AMB) (Signed)
Chronic Care Management   Follow Up Note   10/05/2019 Name: Danny Teo Caskey Sr. MRN: [id number] DOB: 09/07/1950  Referred by: [name], MD Reason for referral : Chronic Care Management (Medication Management)   Danny Estimable Heft Sr. is a 69 y.o. year old male who is a primary care patient of [name], [name], MD. The CCM team was consulted for assistance with chronic disease management and care coordination needs.    Contacted patient for medication management review.  Review of patient status, including review of consultants reports, relevant laboratory and other test results, and collaboration with appropriate care team members and the patient's provider was performed as part of comprehensive patient evaluation and provision of chronic care management services.    SDOH (Social Determinants of Health) assessments performed: No See Care Plan activities for detailed interventions related to [facility])     Outpatient Encounter Medications as of 10/05/2019  Medication Sig Note  . atorvastatin (LIPITOR) 80 MG tablet Take 1 tablet (80 mg total) by mouth daily.   . metFORMIN (GLUCOPHAGE XR) 500 MG 24 hr tablet Take 1 tablet (500 mg total) by mouth 2 (two) times daily. 09/17/2019: Taking 1 QAM  . TRESIBA FLEXTOUCH 100 UNIT/ML SOPN FlexTouch Pen INJECT 0.45 MLS (45 UNITS TOTAL) INTO THE SKIN DAILY. 09/17/2019: 50 units  . TRULICITY 1.5 IA/1.6PV SOPN INJECT 1.5 MG INTO THE SKIN ONCE A WEEK   . ACCU-CHEK GUIDE test strip USE TO TEST SUGAR TWICE A DAY   . acetaminophen (TYLENOL) 325 MG tablet Take 650 mg by mouth every 6 (six) hours as needed.   [name] aspirin 81 MG tablet Take 81 mg by mouth daily.     . blood glucose meter kit and supplies KIT Dispense based on patient and insurance preference. Check CBGs 4 times daily. Dx code E11.9.   [name] carvedilol (COREG) 12.5 MG tablet Take 1 tablet (12.5 mg total) by mouth 2 (two) times daily with a meal.   . clopidogrel (PLAVIX) 75 MG tablet Take 1 tablet (75 mg  total) by mouth daily.   . empagliflozin (JARDIANCE) 25 MG TABS tablet Take 25 mg by mouth daily.   [name] escitalopram (LEXAPRO) 10 MG tablet Take 10 mg by mouth daily.   [name] ezetimibe (ZETIA) 10 MG tablet Take 1 tablet (10 mg total) by mouth daily.   . furosemide (LASIX) 20 MG tablet TAKE 1 TABLET BY MOUTH EVERY OTHER DAY Please schedule office visit for further refills. Thank you!   [name] insulin lispro (HUMALOG) 100 UNIT/ML cartridge Inject 8 Units into the skin 3 (three) times daily with meals. 09/17/2019: Using 8 units QPM  . Insulin Pen Needle (PEN NEEDLES) 32G X 6 MM MISC 1 each by Does not apply route 2 (two) times daily.   . isosorbide mononitrate (IMDUR) 30 MG 24 hr tablet Take 1 tablet (30 mg total) by mouth daily.   . Lancets Misc. (ACCU-CHEK SOFTCLIX LANCET DEV) KIT Dispense one lancing device to test blood glucose four times daily. Dx code E11.9   . lisinopril (ZESTRIL) 2.5 MG tablet Take 1 tablet (2.5 mg total) by mouth daily.   . nitroGLYCERIN (NITROSTAT) 0.4 MG SL tablet Place 1 tablet (0.4 mg total) under the tongue every 5 (five) minutes as needed for chest pain. (Patient not taking: Reported on 09/17/2019)   . potassium chloride (MICRO-K) 10 MEQ CR capsule Take 1 tablet (10 meq) by mouth once EVERY OTHER day with lasix (furosemide)   . SYMBICORT 160-4.5 MCG/ACT inhaler  TAKE 2 PUFFS BY MOUTH TWICE A DAY   . tamsulosin (FLOMAX) 0.4 MG CAPS capsule TAKE 1 CAPSULE BY MOUTH EVERY DAY   . VENTOLIN HFA 108 (90 Base) MCG/ACT inhaler Inhale 2 puffs into the lungs every 6 (six) hours as needed for wheezing or shortness of breath.    No facility-administered encounter medications on file as of 10/05/2019.     Objective:   Goals Addressed            This Visit's Progress     Patient Stated   . PharmD "I don't know what I'm doing with my medications" (pt-stated)       Current Barriers:  . Diabetes: uncontrolled; complicated by chronic medical conditions including CHF, CAD, CKD, most recent  A1c 11.2% o Since last visit, has been back on Jardiance. Has not picked up a glucometer yet to check blood sugars.  . Most recent eGFR: 51 mL/min . Current antihyperglycemic regimen: metformin XR 500 mg daily (max tolerated d/t diarrhea, Jardiance 25 mg - restarted w/ our last appointment, Trulicity 1.5 mg weekly, Tresiba 50 units QAM . Does note some improvement in polyuria over the past few weeks . Current blood glucose readings: NONE, has not picked up glucometer prescription yet . Cardiovascular risk reduction: o Current hypertensive regimen: Carvedilol 12.5 mg BID; lisinopril 2.5 mg daily - held when restarting Jardiance; cardiology f/u next weekfurosemide 20 mg QOD + potassium 10 mEq daily (did not realize it was supposed to be every other day) o Current hyperlipidemia regimen: atorvastatin 80 mg daily - restarted 09/17/19; ezetimibe 10 mg daily  o Current antiplatelet regimen: ASA 81 mg daily, clopidogrel 75 mg daily  . COPD: taking Symbicort 160/4.5 mcg PRN, albuterol PRN  Pharmacist Clinical Goal(s):  [name] Over the next 90 days, patient will work with PharmD and primary care provider to address optimized medication management  Interventions: . Patient was not at home (he was driving in [place]) and was unable to fully review medications, but we did verbally those that he could remember, supplemented by outside fill history.  . Patient was concerned that he didn't have Novolog. Reiterated that we want to see blood sugar readings to determine optimal Novolog dosing, prior to refilling. Continue metformin XR 500 mg daily, Jardiance 25 mg daily, Tresiba 50 units daily, Trulicity 1.5 mg weekly for now. Contacted [facility]; they note they received the glucometer prescription, but it had not been picked up in time so was placed back on hold. Asked that they fill.  . Called patient, let him know it would be filled today and for him to pick up. Reiterated to check fasting and 2 hour post prandial after  largest meal of his day.  . Will message Dr. Rockey Situ to updated that he is NOT taking lisinopril right now and just restarted atorvastatin.  . Patient notes he did not receive the medication list that was mailed after last appointment. Will collaborate w/ office staff to remail  Patient Self Care Activities:  . Patient will check blood glucose BID, document, and provide at future appointments . Patient will focus on medication adherence by utilizing his BID pill box . Patient will take medications as prescribed  Please see past updates related to this goal by clicking on the "Past Updates" button in the selected goal          Plan:  - Scheduled f/u call 11/05/19  Catie Darnelle Maffucci, PharmD, BCACP, [place] Pharmacist [place] [phone]

## 2019-10-12 NOTE — Progress Notes (Signed)
Date:  10/12/2019   ID:  Danny Organ Sr., DOB [birth year], MRN [id number]  Patient Location:  [address] [place] [address]   Provider location:   [facility] [place] [place] office  PCP:  [name], MD  Cardiologist:  Arvid Right [facility]   Chief Complaint  Patient presents with  . office visit    Pt has no complaints. Meds verbally reviewed w/ pt.    History of Present Illness:    Danny TADESSE Sr. is a 69 y.o. male  past medical history of smoke 4 packs a day who stopped earlier in 2011,  coronary artery disease,  cardiac catheterization showing severe two-vessel disease,  severe stenosis of the proximal to mid LAD, severe ostial O1 and O2 disease, moderate to severe mid left circumflex disease that appeared aneurysmal, moderate OM 2 disease ejection fraction 40%  PCI of the LAD on January 11, 2010,  Chronic renal insufficiency,  poorly controlled diabetes on insulin,  hyperlipidemia who presents for routine followup of his coronary artery disease.  covid positive 07/2019, COVID-19 viral pneumonia  hemoglobin A1c 11.2.   pt oxygen saturation went down to 88%, CXR: hazy bilateral peripheral opacities Remdesivir and steroids. Records reviewed  Labs reviewed CR 1.4  Mother with covid, died  Total chol 164, LDL 46 last year 08/2018  Previously seen by Dr. Lucky Cowboy  lower extremity angiography Has not followed up   EKG personally reviewed by myself on todays visit NSR rate 95 bpm, T wave ABn anterolateral leads, I and AVL Same as 10/2017   Prior CV studies:   The following studies were reviewed today:    Past Medical History:  Diagnosis Date  . CAD (coronary artery disease)    a. 01/2010 PCI of LAD; b. 09/2014 PCI/DES of mLAD due to ISR. D1 80, D2 80(jailed), LCX 42m c. 07/2016 NSTEMI/Cath: LM nl, LAD 291mSR, 40d, D1 90ost, 80p, D2 90ost, RI min irregs, LCX min irregs, OM1 90 small, OM2/3 min irregs, RCA min irregs, RPLB1 90, EF 25-35%.    . Chronic combined systolic and diastolic CHF (congestive heart failure) ([place]   a. 07/2016 Echo: EF 30%, severe septal/anterior HK, Gr1 DD.  . Marland KitchenKD (chronic kidney disease), stage III   . COPD (chronic obstructive pulmonary disease) ([place]  . DM2 (diabetes mellitus, type 2) ([place]  . Erectile dysfunction   . HLD (hyperlipidemia)   . HTN (hypertension)   . Ischemic cardiomyopathy    a. 07/2016 Echo: EF 30% w/ sev septal/ant HK. Gr1 DD.   Past Surgical History:  Procedure Laterality Date  . CAD: stent to the LAD    . CARDIAC CATHETERIZATION  10/01/2014  . CARDIAC CATHETERIZATION N/A 07/23/2016   Procedure: Left Heart Cath and Coronary Angiography;  Surgeon: MuWellington HampshireMD;  [facility];  Service: Cardiovascular;  Laterality: N/A;  . CORONARY ANGIOPLASTY WITH STENT PLACEMENT  10/01/2014     No outpatient medications have been marked as taking for the 10/13/19 encounter (Appointment) with GoMinna MerrittsMD.     Allergies:   Patient has no known allergies.   Social History   Tobacco Use  . Smoking status: Former Smoker    Packs/day: 3.00    Years: 0.00    Pack years: 0.00    Types: Cigarettes    Quit date: 09/07/2009    Years since quitting: 10.1  . Smokeless tobacco: Never Used  .  Tobacco comment: quit june 2011  Substance Use Topics  . Alcohol use: Yes    Comment: every other weekend  . Drug use: No     Current Outpatient Medications on File Prior to Visit  Medication Sig Dispense Refill  . ACCU-CHEK GUIDE test strip USE TO TEST SUGAR TWICE A DAY 100 strip 2  . acetaminophen (TYLENOL) 325 MG tablet Take 650 mg by mouth every 6 (six) hours as needed.    [name] aspirin 81 MG tablet Take 81 mg by mouth daily.      [name] atorvastatin (LIPITOR) 80 MG tablet Take 1 tablet (80 mg total) by mouth daily. 90 tablet 3  . blood glucose meter kit and supplies KIT Dispense based on patient and insurance preference. Check CBGs 4 times daily. Dx code E11.9. 1 each 0  .  carvedilol (COREG) 12.5 MG tablet Take 1 tablet (12.5 mg total) by mouth 2 (two) times daily with a meal. 180 tablet 3  . clopidogrel (PLAVIX) 75 MG tablet Take 1 tablet (75 mg total) by mouth daily. 90 tablet 3  . empagliflozin (JARDIANCE) 25 MG TABS tablet Take 25 mg by mouth daily. 90 tablet 3  . escitalopram (LEXAPRO) 10 MG tablet Take 10 mg by mouth daily.    [name] ezetimibe (ZETIA) 10 MG tablet Take 1 tablet (10 mg total) by mouth daily. 90 tablet 3  . furosemide (LASIX) 20 MG tablet TAKE 1 TABLET BY MOUTH EVERY OTHER DAY Please schedule office visit for further refills. Thank you! 15 tablet 0  . insulin lispro (HUMALOG) 100 UNIT/ML cartridge Inject 8 Units into the skin 3 (three) times daily with meals.    . Insulin Pen Needle (PEN NEEDLES) 32G X 6 MM MISC 1 each by Does not apply route 2 (two) times daily. 100 each 11  . isosorbide mononitrate (IMDUR) 30 MG 24 hr tablet Take 1 tablet (30 mg total) by mouth daily. 90 tablet 3  . Lancets Misc. (ACCU-CHEK SOFTCLIX LANCET DEV) KIT Dispense one lancing device to test blood glucose four times daily. Dx code E11.9 1 kit 0  . lisinopril (ZESTRIL) 2.5 MG tablet Take 1 tablet (2.5 mg total) by mouth daily. 90 tablet 3  . metFORMIN (GLUCOPHAGE XR) 500 MG 24 hr tablet Take 1 tablet (500 mg total) by mouth 2 (two) times daily. 180 tablet 3  . nitroGLYCERIN (NITROSTAT) 0.4 MG SL tablet Place 1 tablet (0.4 mg total) under the tongue every 5 (five) minutes as needed for chest pain. (Patient not taking: Reported on 09/17/2019) 25 tablet 3  . potassium chloride (MICRO-K) 10 MEQ CR capsule Take 1 tablet (10 meq) by mouth once EVERY OTHER day with lasix (furosemide) 45 capsule 3  . SYMBICORT 160-4.5 MCG/ACT inhaler TAKE 2 PUFFS BY MOUTH TWICE A DAY 30.6 Inhaler 1  . tamsulosin (FLOMAX) 0.4 MG CAPS capsule TAKE 1 CAPSULE BY MOUTH EVERY DAY 90 capsule 0  . TRESIBA FLEXTOUCH 100 UNIT/ML SOPN FlexTouch Pen INJECT 0.45 MLS (45 UNITS TOTAL) INTO THE SKIN DAILY. 45 pen 1    . TRULICITY 1.5 PJ/0.9TO SOPN INJECT 1.5 MG INTO THE SKIN ONCE A WEEK 6 pen 3  . VENTOLIN HFA 108 (90 Base) MCG/ACT inhaler Inhale 2 puffs into the lungs every 6 (six) hours as needed for wheezing or shortness of breath. 18 g 3   No current facility-administered medications on file prior to visit.     Family Hx: The patient's family history includes Cancer in his brother; Heart attack  in his father.  ROS:   Please see the history of present illness.    Review of Systems  Constitutional: Negative.   HENT: Negative.   Respiratory: Positive for shortness of breath.   Cardiovascular: Negative.   Gastrointestinal: Negative.   Musculoskeletal: Positive for back pain and joint pain.  Neurological: Negative.   Psychiatric/Behavioral: Negative.   All other systems reviewed and are negative.     Labs/Other Tests and Data Reviewed:    Recent Labs: 07/29/2019: B Natriuretic Peptide 104.0 08/02/2019: ALT 30; BUN 48; Creatinine, Ser 1.41; Hemoglobin 16.2; Magnesium 2.2; Platelets 165; Potassium 4.3; Sodium 140   Recent Lipid Panel Lab Results  Component Value Date/Time   CHOL 164 09/01/2018 02:19 PM   TRIG 98 07/29/2019 03:30 PM   HDL 44.40 09/01/2018 02:19 PM   CHOLHDL 4 09/01/2018 02:19 PM   LDLCALC 91 09/01/2018 02:19 PM   LDLDIRECT 64.0 03/20/2018 09:50 AM    Wt Readings from Last 3 Encounters:  08/06/19 237 lb (107.5 kg)  08/02/19 234 lb (106.1 kg)  09/01/18 237 lb 3.2 oz (107.6 kg)     Exam:    Vital Signs: Vital signs may also be detailed in the HPI BP (!) 150/100 (BP Location: Left Arm, Patient Position: Sitting, Cuff Size: Normal)   Pulse 95   Ht 6' (1.829 m)   Wt 240 lb 8 oz (109.1 kg)   SpO2 97%   BMI 32.62 kg/m  Constitutional:  oriented to person, place, and time. No distress.  HENT:  Head: Grossly normal Eyes:  no discharge. No scleral icterus.  Neck: No JVD, no carotid bruits  Cardiovascular: Regular rate and rhythm, no murmurs  appreciated Pulmonary/Chest: Clear to auscultation bilaterally, no wheezes or rails Abdominal: Soft.  no distension.  no tenderness.  Musculoskeletal: Normal range of motion Neurological:  normal muscle tone. Coordination normal. No atrophy Skin: Skin warm and dry Psychiatric: normal affect, pleasant   ASSESSMENT & PLAN:    Chronic combined systolic and diastolic CHF (congestive heart failure) (HCC) Appears relatively euvolemic on today's visit Shortness of breath likely secondary to recovery from Covid and emphysema He is on inhalers No changes to his medications  Coronary artery disease of native artery of native heart with stable angina pectoris (HCC) Stressed importance of diabetes control Poor diet, worsening numbers recently in the setting of Covid and received steroids Continue statin , Zetia, beta-blocker, long-acting nitrate  Centrilobular emphysema ([place]) Stop smoking many years ago, now on inhalers post Covid  Essential hypertension Did not take his medications this morning, long walk stressful getting into the office  Recommend he closely monitor blood pressure at home  Diabetes mellitus type 2, uncontrolled, with complications ([place]) Recommend he go back to his strict diet  CKD (chronic kidney disease) stage 3, GFR 30-59 ml/min (HCC) Stable renal function  Pure hypercholesterolemia Stressed importance of aggressive diabetes control   Total encounter time more than 25 minutes  Greater than 50% was spent in counseling and coordination of care with the patient   Signed, Ida Rogue, MD  10/12/2019 3:40 PM    [facility] [facility] [address], [place], [place] [address]

## 2019-10-13 ENCOUNTER — Ambulatory Visit (INDEPENDENT_AMBULATORY_CARE_PROVIDER_SITE_OTHER): Payer: Medicare Other | Admitting: Cardiovascular Disease

## 2019-10-13 ENCOUNTER — Encounter: Payer: Self-pay | Admitting: Cardiovascular Disease

## 2019-10-13 ENCOUNTER — Other Ambulatory Visit: Payer: Self-pay

## 2019-10-13 VITALS — BP 150/100 | HR 95 | Ht 72.0 in | Wt 240.5 lb

## 2019-10-13 DIAGNOSIS — I25118 Atherosclerotic heart disease of native coronary artery with other forms of angina pectoris: Secondary | ICD-10-CM | POA: Diagnosis not present

## 2019-10-13 DIAGNOSIS — I5042 Chronic combined systolic (congestive) and diastolic (congestive) heart failure: Secondary | ICD-10-CM | POA: Diagnosis not present

## 2019-10-13 DIAGNOSIS — E118 Type 2 diabetes mellitus with unspecified complications: Secondary | ICD-10-CM | POA: Diagnosis not present

## 2019-10-13 DIAGNOSIS — E1165 Type 2 diabetes mellitus with hyperglycemia: Secondary | ICD-10-CM

## 2019-10-13 DIAGNOSIS — E785 Hyperlipidemia, unspecified: Secondary | ICD-10-CM

## 2019-10-13 DIAGNOSIS — N183 Chronic kidney disease, stage 3 unspecified: Secondary | ICD-10-CM

## 2019-10-13 DIAGNOSIS — I10 Essential (primary) hypertension: Secondary | ICD-10-CM

## 2019-10-13 DIAGNOSIS — J432 Centrilobular emphysema: Secondary | ICD-10-CM | POA: Diagnosis not present

## 2019-10-13 DIAGNOSIS — IMO0002 Reserved for concepts with insufficient information to code with codable children: Secondary | ICD-10-CM

## 2019-10-13 NOTE — Patient Instructions (Addendum)
Medication Instructions:  No changes  If you need a refill on your cardiac medications before your next appointment, please call your pharmacy.    Lab work: No new labs needed   If you have labs (blood work) drawn today and your tests are completely normal, you will receive your results only by: . MyChart Message (if you have MyChart) OR . A paper copy in the mail If you have any lab test that is abnormal or we need to change your treatment, we will call you to review the results.   Testing/Procedures: No new testing needed   Follow-Up: At [facility], you and your health needs are our priority.  As part of our continuing mission to provide you with exceptional heart care, we have created designated Provider Care Teams.  These Care Teams include your primary Cardiologist (physician) and Advanced Practice Providers (APPs -  Physician Assistants and Nurse Practitioners) who all work together to provide you with the care you need, when you need it.  . You will need a follow up appointment in 12 months  . Providers on your designated Care Team:   . Christopher Berge, NP . Ryan Dunn, PA-C . Jacquelyn Visser, PA-C  Any Other Special Instructions Will Be Listed Below (If Applicable).  COVID-19 Vaccine Information can be found at: [email or web address] For questions related to vaccine distribution or appointments, please email [email or web address] or call [phone].     

## 2019-11-05 ENCOUNTER — Other Ambulatory Visit: Payer: Self-pay | Admitting: Family Medicine

## 2019-11-05 ENCOUNTER — Emergency Department
Admission: EM | Admit: 2019-11-05 | Discharge: 2019-11-05 | Disposition: A | Discharge status: Home / Self Care | Payer: Medicare Other | Attending: Emergency Medicine | Admitting: Emergency Medicine

## 2019-11-05 ENCOUNTER — Other Ambulatory Visit: Payer: Self-pay

## 2019-11-05 ENCOUNTER — Ambulatory Visit (INDEPENDENT_AMBULATORY_CARE_PROVIDER_SITE_OTHER): Payer: Medicare Other | Admitting: Pharmacist

## 2019-11-05 DIAGNOSIS — IMO0002 Reserved for concepts with insufficient information to code with codable children: Secondary | ICD-10-CM

## 2019-11-05 DIAGNOSIS — Z5321 Procedure and treatment not carried out due to patient leaving prior to being seen by health care provider: Secondary | ICD-10-CM | POA: Diagnosis not present

## 2019-11-05 DIAGNOSIS — E1165 Type 2 diabetes mellitus with hyperglycemia: Secondary | ICD-10-CM | POA: Insufficient documentation

## 2019-11-05 LAB — COMPREHENSIVE METABOLIC PANEL
ALT: 41 U/L (ref 0–44)
AST: 29 U/L (ref 15–41)
Albumin: 4.1 g/dL (ref 3.5–5.0)
Alkaline Phosphatase: 113 U/L (ref 38–126)
Anion gap: 10 (ref 5–15)
BUN: 27 mg/dL — ABNORMAL HIGH (ref 8–23)
CO2: 29 mmol/L (ref 22–32)
Calcium: 9.7 mg/dL (ref 8.9–10.3)
Chloride: 99 mmol/L (ref 98–111)
Creatinine, Ser: 1.33 mg/dL — ABNORMAL HIGH (ref 0.61–1.24)
GFR calc Af Amer: >60 mL/min (ref >60)
GFR calc non Af Amer: 55 mL/min — ABNORMAL LOW (ref >60)
Glucose, Bld: 266 mg/dL — ABNORMAL HIGH (ref 70–99)
Potassium: 4.5 mmol/L (ref 3.5–5.1)
Sodium: 138 mmol/L (ref 135–145)
Total Bilirubin: 0.7 mg/dL (ref 0.3–1.2)
Total Protein: 7.7 g/dL (ref 6.5–8.1)

## 2019-11-05 LAB — CBC
HCT: 53.1 % — ABNORMAL HIGH (ref 39.0–52.0)
Hemoglobin: 17.6 g/dL — ABNORMAL HIGH (ref 13.0–17.0)
MCH: 31.4 pg (ref 26.0–34.0)
MCHC: 33.1 g/dL (ref 30.0–36.0)
MCV: 94.8 fL (ref 80.0–100.0)
Platelets: 280 10*3/uL (ref 150–400)
RBC: 5.6 MIL/uL (ref 4.22–5.81)
RDW: 12.8 % (ref 11.5–15.5)
WBC: 8.5 10*3/uL (ref 4.0–10.5)
nRBC: 0 % (ref 0.0–0.2)

## 2019-11-05 LAB — GLUCOSE, CAPILLARY: Glucose-Capillary: 273 mg/dL — ABNORMAL HIGH (ref 70–99)

## 2019-11-05 NOTE — Chronic Care Management (AMB) (Signed)
Chronic Care Management   Follow Up Note   11/05/2019 Name: Danny Cartlidge Gershman Sr. MRN: [id number] DOB: 1950/12/29  Referred by: [name], MD Reason for referral : Chronic Care Management (Medication Management )   Danny Estimable Depierro Sr. is a 69 y.o. year old male who is a primary care patient of [name], [name], MD. The CCM team was consulted for assistance with chronic disease management and care coordination needs.    Contacted patient for medication management review.   Review of patient status, including review of consultants reports, relevant laboratory and other test results, and collaboration with appropriate care team members and the patient's provider was performed as part of comprehensive patient evaluation and provision of chronic care management services.    SDOH (Social Determinants of Health) assessments performed: Yes See Care Plan activities for detailed interventions related to [facility])     Outpatient Encounter Medications as of 11/05/2019  Medication Sig Note  . ACCU-CHEK GUIDE test strip USE TO TEST SUGAR TWICE A DAY   . acetaminophen (TYLENOL) 325 MG tablet Take 650 mg by mouth every 6 (six) hours as needed. 11/05/2019: Taking 1-2 tablets every 4 hours  . aspirin 81 MG tablet Take 81 mg by mouth daily.     [name] atorvastatin (LIPITOR) 80 MG tablet Take 1 tablet (80 mg total) by mouth daily.   . blood glucose meter kit and supplies KIT Dispense based on patient and insurance preference. Check CBGs 4 times daily. Dx code E11.9.   [name] carvedilol (COREG) 12.5 MG tablet Take 1 tablet (12.5 mg total) by mouth 2 (two) times daily with a meal.   . clopidogrel (PLAVIX) 75 MG tablet Take 1 tablet (75 mg total) by mouth daily.   . empagliflozin (JARDIANCE) 25 MG TABS tablet Take 25 mg by mouth daily.   [name] escitalopram (LEXAPRO) 10 MG tablet TAKE 1 TABLET BY MOUTH EVERY DAY   . ezetimibe (ZETIA) 10 MG tablet Take 1 tablet (10 mg total) by mouth daily.   . furosemide (LASIX) 20 MG  tablet TAKE 1 TABLET BY MOUTH EVERY OTHER DAY Please schedule office visit for further refills. Thank you!   . Insulin Pen Needle (PEN NEEDLES) 32G X 6 MM MISC 1 each by Does not apply route 2 (two) times daily.   . isosorbide mononitrate (IMDUR) 30 MG 24 hr tablet Take 1 tablet (30 mg total) by mouth daily.   . metFORMIN (GLUCOPHAGE XR) 500 MG 24 hr tablet Take 1 tablet (500 mg total) by mouth 2 (two) times daily. 09/17/2019: Taking 1 QAM  . potassium chloride (MICRO-K) 10 MEQ CR capsule Take 1 tablet (10 meq) by mouth once EVERY OTHER day with lasix (furosemide)   . SYMBICORT 160-4.5 MCG/ACT inhaler TAKE 2 PUFFS BY MOUTH TWICE A DAY   . tamsulosin (FLOMAX) 0.4 MG CAPS capsule TAKE 1 CAPSULE BY MOUTH EVERY DAY   . TRESIBA FLEXTOUCH 100 UNIT/ML SOPN FlexTouch Pen INJECT 0.45 MLS (45 UNITS TOTAL) INTO THE SKIN DAILY. 09/17/2019: 50 units  . TRULICITY 1.5 WU/9.8JX SOPN INJECT 1.5 MG INTO THE SKIN ONCE A WEEK   . [DISCONTINUED] Lancets Misc. (ACCU-CHEK SOFTCLIX LANCET DEV) KIT Dispense one lancing device to test blood glucose four times daily. Dx code E11.9   . lisinopril (ZESTRIL) 2.5 MG tablet Take 1 tablet (2.5 mg total) by mouth daily. (Patient not taking: Reported on 11/05/2019) 11/05/2019: Was holding until cards appt, but decision to restart does not appear to have been addressed  .  nitroGLYCERIN (NITROSTAT) 0.4 MG SL tablet Place 1 tablet (0.4 mg total) under the tongue every 5 (five) minutes as needed for chest pain.   . VENTOLIN HFA 108 (90 Base) MCG/ACT inhaler Inhale 2 puffs into the lungs every 6 (six) hours as needed for wheezing or shortness of breath.   . [DISCONTINUED] insulin lispro (HUMALOG) 100 UNIT/ML cartridge Inject 8 Units into the skin 3 (three) times daily with meals. 09/17/2019: Using 8 units QPM   No facility-administered encounter medications on file as of 11/05/2019.     Objective:   Goals Addressed            This Visit's Progress     Patient Stated   . PharmD "I don't  know what I'm doing with my medications" (pt-stated)       CARE PLAN ENTRY (see longtitudinal plan of care for additional care plan information)  Current Barriers:  . Diabetes: uncontrolled; complicated by chronic medical conditions including CHF, CAD, CKD, most recent A1c 11.2%; s/p COVID. Reports that his SOB has continued since COVID diagnosis o Showed up to clinic for our phone visit this morning accidentally, but was back home when I called him. He noted that he was "feeling terribly", reporting blurred vision (though also has lost his glasses), fatigue, and dry mouth. Denies nausea and vomiting. In the rush to get to clinic today, he forgot to take his medications. I asked him to check his sugar while on the phone with me and it was 543 o Also reports back pain in his "lower back that goes down my leg" . Most recent eGFR: 51 mL/min . Current antihyperglycemic regimen: metformin XR 500 mg daily; Jardiance 25 mg, Trulicity 1.5 mg weekly, Tresiba 50 units QAM . Current blood glucose readings:  . Cardiovascular risk reduction: o Current hypertensive regimen: Carvedilol 12.5 mg BID; furosemide 20 mg QOD + potassium 10 mEq daily (did not realize it was supposed to be every other day); HOLDING lisinopril 2.5 mg o Current hyperlipidemia regimen: atorvastatin 80 mg daily; ezetimibe 10 mg daily  o Current antiplatelet regimen: ASA 81 mg daily, clopidogrel 75 mg daily  . COPD: taking Symbicort 160/4.5 mcg PRN, albuterol PRN  Pharmacist Clinical Goal(s):  [name] Over the next 90 days, patient will work with PharmD and primary care provider to address optimized medication management  Interventions: . Reviewed symptoms w/ Dr. Nicki Reaper, as PCP is off today. Recommended patient go to the ED for evaluation. Patient is in agreement, and will ask someone to drive him to the ED today.  . D/t low health literacy and confusion about self-management of chronic disease states, have placed referral for [facility] RN CM support.    Patient Self Care Activities:  . Patient will check blood glucose BID, document, and provide at future appointments . Patient will focus on medication adherence by utilizing his BID pill box . Patient will take medications as prescribed  Please see past updates related to this goal by clicking on the "Past Updates" button in the selected goal          Plan:  - Will follow for treatment plan  Catie Darnelle Maffucci, PharmD, [place], [place] Pharmacist [place] [phone]

## 2019-11-05 NOTE — ED Triage Notes (Signed)
Pt sent by PCP for eval - CBG 542 - pt reports taking his medication/insulin as prescribed - pt reports that he does not check his CBG normally even prior to giving himself insulin -Reports feeling "fuzzy headed" today - Denies dizziness, headache, abd pain

## 2019-11-05 NOTE — Patient Instructions (Signed)
Visit Information  Goals Addressed            This Visit's Progress     Patient Stated   . PharmD "I don't know what I'm doing with my medications" (pt-stated)       CARE PLAN ENTRY (see longtitudinal plan of care for additional care plan information)  Current Barriers:  . Diabetes: uncontrolled; complicated by chronic medical conditions including CHF, CAD, CKD, most recent A1c 11.2%; s/p COVID. Reports that his SOB has continued since COVID diagnosis o Showed up to clinic for our phone visit this morning accidentally, but was back home when I called him. He noted that he was "feeling terribly", reporting blurred vision (though also has lost his glasses), fatigue, and dry mouth. Denies nausea and vomiting. In the rush to get to clinic today, he forgot to take his medications. I asked him to check his sugar while on the phone with me and it was 543 o Also reports back pain in his "lower back that goes down my leg" . Most recent eGFR: 51 mL/min . Current antihyperglycemic regimen: metformin XR 500 mg daily; Jardiance 25 mg, Trulicity 1.5 mg weekly, Tresiba 50 units QAM . Current blood glucose readings:  . Cardiovascular risk reduction: o Current hypertensive regimen: Carvedilol 12.5 mg BID; furosemide 20 mg QOD + potassium 10 mEq daily (did not realize it was supposed to be every other day); HOLDING lisinopril 2.5 mg o Current hyperlipidemia regimen: atorvastatin 80 mg daily; ezetimibe 10 mg daily  o Current antiplatelet regimen: ASA 81 mg daily, clopidogrel 75 mg daily  . COPD: taking Symbicort 160/4.5 mcg PRN, albuterol PRN  Pharmacist Clinical Goal(s):  [name] Over the next 90 days, patient will work with PharmD and primary care provider to address optimized medication management  Interventions: . Reviewed symptoms w/ Dr. Nicki Reaper, as PCP is off today. Recommended patient go to the ED for evaluation. Patient is in agreement, and will ask someone to drive him to the ED today.  . D/t low  health literacy and confusion about self-management of chronic disease states, have placed referral for [facility] RN CM support.   Patient Self Care Activities:  . Patient will check blood glucose BID, document, and provide at future appointments . Patient will focus on medication adherence by utilizing his BID pill box . Patient will take medications as prescribed  Please see past updates related to this goal by clicking on the "Past Updates" button in the selected goal         Patient verbalizes understanding of instructions provided today.   Plan:  - Will follow for treatment plan  Catie Darnelle Maffucci, PharmD, [place], CPP Clinical Pharmacist [place] [phone]

## 2019-11-06 ENCOUNTER — Telehealth: Payer: Self-pay | Admitting: Emergency Medicine

## 2019-11-06 NOTE — Telephone Encounter (Signed)
Called patient due to lwot to inquire about condition and follow up plans. Says he will tell his doctor about his symptoms.

## 2019-11-09 ENCOUNTER — Ambulatory Visit: Payer: Self-pay | Admitting: Pharmacist

## 2019-11-09 DIAGNOSIS — E118 Type 2 diabetes mellitus with unspecified complications: Secondary | ICD-10-CM

## 2019-11-09 DIAGNOSIS — E1165 Type 2 diabetes mellitus with hyperglycemia: Secondary | ICD-10-CM

## 2019-11-09 DIAGNOSIS — IMO0002 Reserved for concepts with insufficient information to code with codable children: Secondary | ICD-10-CM

## 2019-11-09 NOTE — Chronic Care Management (AMB) (Signed)
Chronic Care Management   Follow Up Note   11/09/2019 Name: Jlen Wintle Pendergraft Sr. MRN: [id number] DOB: Sep 04, 1950  Referred by: [name], MD Reason for referral : Chronic Care Management (Medication Management)   Cristopher Estimable Delcarlo Sr. is a 69 y.o. year old male who is a primary care patient of [name], [name], MD. The CCM team was consulted for assistance with chronic disease management and care coordination needs.    Contacted patient to follow up on ED visit last week for hyperglycemia.  Review of patient status, including review of consultants reports, relevant laboratory and other test results, and collaboration with appropriate care team members and the patient's provider was performed as part of comprehensive patient evaluation and provision of chronic care management services.    SDOH (Social Determinants of Health) assessments performed: No See Care Plan activities for detailed interventions related to [facility])     Outpatient Encounter Medications as of 11/09/2019  Medication Sig Note  . ACCU-CHEK GUIDE test strip USE TO TEST SUGAR TWICE A DAY   . acetaminophen (TYLENOL) 325 MG tablet Take 650 mg by mouth every 6 (six) hours as needed. 11/05/2019: Taking 1-2 tablets every 4 hours  . aspirin 81 MG tablet Take 81 mg by mouth daily.     [name] atorvastatin (LIPITOR) 80 MG tablet Take 1 tablet (80 mg total) by mouth daily.   . blood glucose meter kit and supplies KIT Dispense based on patient and insurance preference. Check CBGs 4 times daily. Dx code E11.9.   [name] carvedilol (COREG) 12.5 MG tablet Take 1 tablet (12.5 mg total) by mouth 2 (two) times daily with a meal.   . clopidogrel (PLAVIX) 75 MG tablet Take 1 tablet (75 mg total) by mouth daily.   . empagliflozin (JARDIANCE) 25 MG TABS tablet Take 25 mg by mouth daily.   [name] escitalopram (LEXAPRO) 10 MG tablet TAKE 1 TABLET BY MOUTH EVERY DAY   . ezetimibe (ZETIA) 10 MG tablet Take 1 tablet (10 mg total) by mouth daily.   . furosemide  (LASIX) 20 MG tablet TAKE 1 TABLET BY MOUTH EVERY OTHER DAY Please schedule office visit for further refills. Thank you!   . Insulin Pen Needle (PEN NEEDLES) 32G X 6 MM MISC 1 each by Does not apply route 2 (two) times daily.   . isosorbide mononitrate (IMDUR) 30 MG 24 hr tablet Take 1 tablet (30 mg total) by mouth daily.   [name] lisinopril (ZESTRIL) 2.5 MG tablet Take 1 tablet (2.5 mg total) by mouth daily. (Patient not taking: Reported on 11/05/2019) 11/05/2019: Was holding until cards appt, but decision to restart does not appear to have been addressed  . metFORMIN (GLUCOPHAGE XR) 500 MG 24 hr tablet Take 1 tablet (500 mg total) by mouth 2 (two) times daily. 09/17/2019: Taking 1 QAM  . nitroGLYCERIN (NITROSTAT) 0.4 MG SL tablet Place 1 tablet (0.4 mg total) under the tongue every 5 (five) minutes as needed for chest pain.   . potassium chloride (MICRO-K) 10 MEQ CR capsule Take 1 tablet (10 meq) by mouth once EVERY OTHER day with lasix (furosemide)   . SYMBICORT 160-4.5 MCG/ACT inhaler TAKE 2 PUFFS BY MOUTH TWICE A DAY   . tamsulosin (FLOMAX) 0.4 MG CAPS capsule TAKE 1 CAPSULE BY MOUTH EVERY DAY   . TRESIBA FLEXTOUCH 100 UNIT/ML SOPN FlexTouch Pen INJECT 0.45 MLS (45 UNITS TOTAL) INTO THE SKIN DAILY. 09/17/2019: 50 units  . TRULICITY 1.5 KF/8.4CR SOPN INJECT 1.5 MG INTO THE  SKIN ONCE A WEEK   . VENTOLIN HFA 108 (90 Base) MCG/ACT inhaler Inhale 2 puffs into the lungs every 6 (six) hours as needed for wheezing or shortness of breath.    No facility-administered encounter medications on file as of 11/09/2019.     Objective:   Goals Addressed            This Visit's Progress     Patient Stated   . PharmD "I don't know what I'm doing with my medications" (pt-stated)       CARE PLAN ENTRY (see longtitudinal plan of care for additional care plan information)  Current Barriers:  . Diabetes: uncontrolled; complicated by chronic medical conditions including CHF, CAD, CKD, most recent A1c 11.2%; s/p COVID.    o Appt last week; patient reported BG was >500, recommended ED evaluation. Patient went to ED, BG was 270. Patient left AMA.  o Today, reports he is feeling better. Is at the grocery store running errands during my call, so unable to check BG . Most recent eGFR: 51 mL/min . Current antihyperglycemic regimen: metformin XR 500 mg daily; Jardiance 25 mg, Trulicity 1.5 mg weekly, Tresiba 50 units QAM . Current meal patterns:  o Breakfast: 2 eggs, grits, sometimes water, sometimes milk o Lunch:  . Current blood glucose readings:  o Did not check sugar this morning. Has not checked any sugars since ED visit last week.  . Cardiovascular risk reduction: o Current hypertensive regimen: Carvedilol 12.5 mg BID; furosemide 20 mg QOD + potassium 10 mEq daily (did not realize it was supposed to be every other day); HOLDING lisinopril 2.5 mg o Current hyperlipidemia regimen: atorvastatin 80 mg daily; ezetimibe 10 mg daily  o Current antiplatelet regimen: ASA 81 mg daily, clopidogrel 75 mg daily  . COPD: taking Symbicort 160/4.5 mcg PRN, albuterol PRN  Pharmacist Clinical Goal(s):  [name] Over the next 90 days, patient will work with PharmD and primary care provider to address optimized medication management  Interventions: . Strongly reiterated importance of blood glucose review and documentation to help guide medication adjustment.  . Mailing patient BG log. Encouraged to check fasting and at least one 2 hour post prandial reading daily, and bring to review.  . Patient last had f/u with PCP >3 months ago. Will collaborate w/ office staff to outreach patient to schedule an appt w/ PCP for chronic disease state follow up.   Patient Self Care Activities:  . Patient will check blood glucose BID, document, and provide at future appointments . Patient will focus on medication adherence by utilizing his BID pill box . Patient will take medications as prescribed  Please see past updates related to this goal by  clicking on the "Past Updates" button in the selected goal          Plan:  - Scheduled f/u call 12/21/19  Catie Darnelle Maffucci, PharmD, BCACP, [place] Pharmacist [place] [phone]

## 2019-11-09 NOTE — Patient Instructions (Addendum)
[name], Danny [name] are copies of blood sugar logs. Please check your sugar AT LEAST twice daily 1) fasting, before eating breakfast and 2) about 2 hours after a meal, and write these down to bring to review at your next appointment. We can't help adjust your medications if we do not know how your blood sugars are running.    Here are the medications I currently have listed for you. Please review your bottles and call me if there are any differences or if you have any questions.   Morning: - Aspirin 81 mg - Carvedilol 12.5 mg - Clopidogrel 75 mg  - Jardiance 25 mg  - Escitalopram 10 mg - Furosemide 20 mg EVERY OTHER MORNING - Potassium 10 mEq EVERY OTHER MORNING (with furosemide) - Isosorbide 30 mg  - Lisinopril 2.5 mg  - Metformin XR 500 mg   Tresiba (insulin) 50 units daily   Evening: - Carvedilol 12.5 mg  - Atorvastatin 80 mg  - Ezetimibe 10 mg  - Tamsulosin 0.4 mg   Trulicity 1.5 mg weekly   Visit Information  Goals Addressed            This Visit's Progress     Patient Stated   . PharmD "I don't know what I'm doing with my medications" (pt-stated)       CARE PLAN ENTRY (see longtitudinal plan of care for additional care plan information)  Current Barriers:  . Diabetes: uncontrolled; complicated by chronic medical conditions including CHF, CAD, CKD, most recent A1c 11.2%; s/p COVID.   o Appt last week; patient reported BG was >500, recommended ED evaluation. Patient went to ED, BG was 270. Patient left AMA.  o Today, reports he is feeling better. Is at the grocery store running errands during my call, so unable to check BG . Most recent eGFR: 51 mL/min . Current antihyperglycemic regimen: metformin XR 500 mg daily; Jardiance 25 mg, Trulicity 1.5 mg weekly, Tresiba 50 units QAM . Current meal patterns:  o Breakfast: 2 eggs, grits, sometimes water, sometimes milk o Lunch:  . Current blood glucose readings:  o Did not check sugar this morning. Has not checked  any sugars since ED visit last week.  . Cardiovascular risk reduction: o Current hypertensive regimen: Carvedilol 12.5 mg BID; furosemide 20 mg QOD + potassium 10 mEq daily (did not realize it was supposed to be every other day); HOLDING lisinopril 2.5 mg o Current hyperlipidemia regimen: atorvastatin 80 mg daily; ezetimibe 10 mg daily  o Current antiplatelet regimen: ASA 81 mg daily, clopidogrel 75 mg daily  . COPD: taking Symbicort 160/4.5 mcg PRN, albuterol PRN  Pharmacist Clinical Goal(s):  [name] Over the next 90 days, patient will work with PharmD and primary care provider to address optimized medication management  Interventions: . Strongly reiterated importance of blood glucose review and documentation to help guide medication adjustment.  . Mailing patient BG log. Encouraged to check fasting and at least one 2 hour post prandial reading daily, and bring to review.  . Patient last had f/u with PCP >3 months ago. Will collaborate w/ office staff to outreach patient to schedule an appt w/ PCP for chronic disease state follow up.   Patient Self Care Activities:  . Patient will check blood glucose BID, document, and provide at future appointments . Patient will focus on medication adherence by utilizing his BID pill box . Patient will take medications as prescribed  Please see past updates related to this goal by clicking on  the "Past Updates" button in the selected goal         Patient verbalizes understanding of instructions provided today.    Plan:  - Scheduled f/u call 12/21/19  Catie Darnelle Maffucci, PharmD, BCACP, CPP Clinical Pharmacist [place] [phone]

## 2019-11-13 ENCOUNTER — Other Ambulatory Visit: Payer: Self-pay | Admitting: *Deleted

## 2019-11-13 ENCOUNTER — Encounter: Payer: Self-pay | Admitting: *Deleted

## 2019-11-13 NOTE — Patient Outreach (Signed)
[facility] Customer service manager [facility]) Care Management [facility] CM Telephone Outreach, new referral  11/13/2019  LECIL TAPP Sr. 30-Sep-1950 [id number]  Successful telephone outreach to [name], 69 y/o male referred to [facility] RN CM by PCP office for routine outreach for support/ education around self-health disease management of chronic disease state of DM; patient has had no recent inpatient hospitalizations.  Patient has history including, but not limited to, DM with neuropathy; combines CHF; COPD; HTN/ HLD; CAD; GERD; obesity; anxiety/ depression; and recent COVID-19 infection.  HIPAA/ identity verified and [facility] CM services discussed briefly with patient, who states that he is currently driving and "surrounded by cars all around me;" he declines talking to me today and requests call back next week "any time;" acknowledges that [facility] CM Pharmacist Catie Feliz Beam is currently involved in his care; states he is "not sure" how I might help him.  Encouraged patient to listen out for my call next week so I can initiate screening/ assessment; he is agreeable to this.  Patient declines taking my phone number today and again just asks again that I re-attempt call to him next week.  Plan:  Will re-attempt outreach to patient within 4 business days, as he has requested  Caryl Pina, RN, BSN, CCRN [employer]umnus [facility] Coordinator [facility] Care Management  [phone]

## 2019-11-15 ENCOUNTER — Other Ambulatory Visit: Payer: Self-pay | Admitting: Family Medicine

## 2019-11-15 DIAGNOSIS — IMO0002 Reserved for concepts with insufficient information to code with codable children: Secondary | ICD-10-CM

## 2019-11-15 DIAGNOSIS — E1165 Type 2 diabetes mellitus with hyperglycemia: Secondary | ICD-10-CM

## 2019-11-17 ENCOUNTER — Telehealth: Payer: Self-pay | Admitting: Family Medicine

## 2019-11-17 MED ORDER — PEN NEEDLES 32G X 6 MM MISC: 1.0000 | Freq: Two times a day (BID) | 11 refills | Status: DC

## 2019-11-17 NOTE — Telephone Encounter (Signed)
Patient came into office requesting refill on Tresiba  And Trulicity called pharmacy the Danny [name] was out of stock when patient went to pick up and will be in this afternoon after 2 PM and patient Trulicity not due to be filled until 11/22/19 Patient has refills on both medications patient is aware.

## 2019-11-17 NOTE — Telephone Encounter (Signed)
Patient is out of his

## 2019-11-17 NOTE — Telephone Encounter (Signed)
Patient is out of his Insulin Pen Needle (PEN NEEDLES) 32G X 6 MM MISC for a week.

## 2019-11-18 ENCOUNTER — Other Ambulatory Visit: Payer: Self-pay | Admitting: *Deleted

## 2019-11-18 ENCOUNTER — Encounter: Payer: Self-pay | Admitting: *Deleted

## 2019-11-18 NOTE — Patient Outreach (Signed)
[facility] Customer service manager [facility]) Care Management [facility] Community CM Telephone [place], Danny [name] PCP referral/ routine  11/18/2019  Danny Haring Gryder Sr. [birth year] [id number]  Successful telephone outreach to Danny [name], 69 y/o male referred to [facility] RN CM by PCP office for routine outreach for support/ education around self-health disease management of chronic disease state of DM; patient has had no recent inpatient hospitalizations.  Patient has history including, but not limited to, DM with neuropathy; combines CHF; COPD; HTN/ HLD; CAD; GERD; obesity; anxiety/ depression; and recent COVID-19 infection.  HIPAA/ identity verified and patient was reminded of [facility] CM services from our chat last week.  Patient immediately states today, and throughout our conversation repeats over and over, "I'm just in another world;" patient will not elaborate and does not seem to understand my questions asking about specific symptoms- eventually he tells me that he drove to the store and started feeling bad; states he is now waiting for his family member to pick him up, because he does not think he should be driving.  Patient tells me that he "finally got and started his taking" his insulin after recently being without it for " along time;" and states that he took the insulin this morning and subsequently "felt like I was out of the world."  Patient denies need for me to contact EMS for him; states, "I don't ever want to go back to the hospital" after his previous hospital visit a few months ago.  Patient talks in circles throughout our conversation and he does not seem to be able to focus.  He denies pain and he sounds to be in no distress throughout our conversation today.   Patient further reports:  -- has medications and takes as prescribed; as noted above, reports he "just" re-started taking insulin; he cannot remember the name of his insulin, and eventually, with coaching, confirms that he is now taking [place]; states he is  taking 50 Units; discussed with patient that the instructions I am seeing indicate that he should take 45 units; he tells me that he is supposed to be taking "50 units;" reports that he has all other medications, and takes as prescribed; initially, he tells me that he does not have his prescribed [place], but then later he tells me he does.  Patient seems confused in general around his medications; confirms that he is working with [facility] Chronic CM Pharmacy team embedded in PCP office, and I encouraged him to continue actively engaging with pharmacist  -- has scheduled provider appointment with PCP December 01, 2019; plans to attend; states he will drive self  -- denies community resource needs today; states independent in ADL/ iADl's; lives with his adult son; still drives; SDOH completed for depression/ transportation/ food insecurity: no concerns identified  -- no recent falls; does not use assistive devices; reports remote fall without injury "about a year ago"  --reports does not currently have exisisting documents for Advanced Directives/ Living Will/ HCPOA in place; denies desire for information on creating same; endorses self as "full code"  Self-health management of chronic disease state of DM: -- has not monitored blood sugar, "in a long time;" reports has glucometer but "forgot" to take blood sugar despite having remembered to take his insulin: discussed importance of monitoring blood sugar with use of insulin and he admits that he has been told to monitor his blood sugar four times per day: I encouraged patient to promptly start this practice and to record on paper; he states he will  do -- understand that his blood sugar/ A1-C have been "very high;" he blames having the corona virus several months ago for increasing his blood sugars -- does not follow any specific diet; "tries to eat healthy;" encouraged patient to write down the foods he is eating on paper to take to upcoming PCP appointment so  possible areas of needed changes to diet may be identified; patient states he will do  Patient denies further issues, concerns, or problems today.  I confirmed that patient has my direct phone number, the main [facility] CM office phone number, and the [facility] CM 24-hour nurse advice phone number should issues arise prior to next scheduled [facility] Community CM outreach.  Encouraged patient to contact me directly if needs, questions, issues, or concerns arise prior to next scheduled outreach post-upcoming scheduled PCP office visit; patient agreed to do so.  Plan:  Patient will take medications as prescribed and will attend all scheduled provider appointments  Patient will attend office visit with PCP and will take his recorded blood sugars and diet intake logs; will take his medications to office visit   Patient will promptly notify care providers for any new concerns/ issues/ problems that arise  Patient will begin monitoring/ recording blood sugars three - four times daily and will keep food food journal; patient will take these to upcoming scheduled PCP provider appointment  I will make patient's PCP aware of [facility] Community RN CM involvement in patient's care-- will send barriers letter  [facility] Community CM outreach to continue with scheduled phone call post- upcoming PCP office visit  [facility] CM Care Plan Problem One     Most Recent Value  Care Plan Problem One  Self-health management of chronic disease state of DM, as evidenced by patient reporting and most recent A1-C of 11.2  Role Documenting the Problem One  Care Management Coordinator  Care Plan for Problem One  Active  [facility] Long Term Goal   Over the next 60 days, patient will monitor and record blood sugars at least 3 times per day as evidenced by review of same with patient during [facility] RN CM outreach  [facility] Long Term Goal Start Date  11/18/19  Interventions for Problem One Long Term Goal  Discussed with patient his current clinical condition,  discussed  his understanding of chronic disease state of DM,  confirmed that patient has re-initiated insulin therapy, but has not resumed monitoring/ recording of blood sugars at home,  encouraged patient to promptly initiated monitoring/ recording of blood sugars at home,  discussed with patient his most recent A1-C level,  discussed importance of monitoring blood sugars at home when on insulin,  placed printed educational material in mail to patient and enocuraged patient to promptly review  [facility] CM Short Term Goal #1   Over the next 30 days, patient will attend scheduled PCP office visit and will verbalize new A1-C result, as evidenced by patient reporting and review of same with patient during [facility] RN CM outreach  [facility] CM Short Term Goal #1 Start Date  11/18/19  Interventions for Short Term Goal #1  Discussed patient's current understanding of chronic disease state of DM,  discussed his last A1-C and need for new A1-C level,  confirmed that patient has scheduled upcoming PCP office visit, along with plans to attend as scheduled- encouraged patient to take his monitored/ recorded blood sugars, dietary record, and all medications with him to upcoming scheduled provider appointment  [facility] CM Short Term Goal #2   Over  the next 30 days, patient will record dietary intake for solid and liquid foods, as evidenced by patient reporting and review of same during [facility] RN CM outreach  [facility] CM Short Term Goal #2 Start Date  11/18/19  Interventions for Short Term Goal #2  Discussed need for patient to monitor his solid and liquid dietary intake to be able to identify areas he can work to improve,  discussed self-empowerment through knowledge with patient and encouarged him to begin this practice     I appreciate the opportunity to participate in Mr. Wenger's care,  Oneta Rack, RN, BSN, [place] Coordinator [facility] Care Management  [phone]

## 2019-11-26 ENCOUNTER — Encounter: Payer: Self-pay | Admitting: *Deleted

## 2019-12-01 ENCOUNTER — Ambulatory Visit: Payer: Medicare Other | Admitting: Family Medicine

## 2019-12-02 ENCOUNTER — Encounter: Payer: Self-pay | Admitting: *Deleted

## 2019-12-02 ENCOUNTER — Other Ambulatory Visit: Payer: Self-pay | Admitting: *Deleted

## 2019-12-02 NOTE — Patient Outreach (Signed)
[facility] ([facility]) Care Management [facility] Community CM Telephone Outreach, Case Closure  12/02/2019  Danny M Baldini Sr. 09/28/1950 [id number]  Successful telephone outreach to Danny [name], 69 y/o male referred to [facility] RN CM by PCP office for routine outreach for support/ education around self-health disease management of chronic disease state of DM; patient has had no recent inpatient hospitalizations.Patient has history including, but not limited to, DM with neuropathy; combines CHF; COPD; HTN/ HLD; CAD; GERD; obesity; anxiety/ depression; and recent COVID-19 infection.  HIPAA/ identity verified; today patient again reports "not doing good at all with my diabetes," and states he "is just going backwards."  Patient reports that he did not attend yesterday's scheduled PCP office visit, as he thought "it was tomorrow;" inquired whether patient had started recording his blood sugars at home/ food journaling, as recommended at time of last [facility] RN CM outreach-- patient states, "I can't write all of this stuff down," and confirms that he "has not written any" blood sugars or food intake.  He reports that "it is too much," and is not willing today to begin monitoring/ recording food/ dietary intake OR his blood sugars at home.  He tells me "all of my sugars are too high," but declines review of same, again stating he has not written anything down for review.  Explained to patient that for [facility] CM services to be of help/ value to him, he must complete the agreed upon plan of care, and he reports "just can't do it" today.  We discussed case closure, as he is clearly not willing to begin following established plan of care, and he is agreeable to this; I encouraged patient to re-contact me in the future should he change his mind and wish to begin working with [facility] CM team, but explained that this would require him to follow established plan of care to monitor/ record blood sugars, take his medications,  attend provider office visits, and work on dietary strategies for DM.  He states he will re-contact me if he changes his mind.   I encouraged patient to promptly re-schedule yesterday's missed (no show) PCP office visit.  Patient denies further issues, concerns, or problems today.  I confirmed that patient has my direct phone number, the main [facility] CM office phone number, and the [facility] CM 24-hour nurse advice phone number should he change his mind and wish to re-engage with [facility] RN CM.  Plan:  I will make patient inactive with [facility] RN CM and close [facility] RN CM program, as he admits he is unable/ unwilling to follow established plan of care and will make patient's PCP aware of same.  [facility] CM Care Plan Problem One     Most Recent Value  Care Plan Problem One  Self-health management of chronic disease state of DM, as evidenced by patient reporting and most recent A1-C of 11.2  Role Documenting the Problem One  Care Management Coordinator  Care Plan for Problem One  Not Active  [facility] Long Term Goal   Over the next 60 days, patient will monitor and record blood sugars at least 3 times per day as evidenced by review of same with patient during [facility] RN CM outreach  [facility] Long Term Goal Start Date  11/18/19  [facility] Long Term Goal Met Date  12/02/19 [Goal not met- see narrative]  Interventions for Problem One Long Term Goal  Confirmed with patient that he has not recorded any blood sugars since last outreach,  confirmed that patient   no longer wishes to participate in [facility] CM program,  encouraged patient to re-contact me in the future should he change his mind   [facility] CM Short Term Goal #1   Over the next 30 days, patient will attend scheduled PCP office visit and will verbalize new A1-C result, as evidenced by patient reporting and review of same with patient during [facility] RN CM outreach  [facility] CM Short Term Goal #1 Start Date  11/18/19  [facility] CM Short Term Goal #1 Met Date  12/02/19 [Goal not met]  Interventions for Short Term  Goal #1  Confirmed that patient did not attend yesterday's scheduled office visit with PCP,  encouraged patient to promptly re-schedule office visit  [facility] CM Short Term Goal #2   Over the next 30 days, patient will record dietary intake for solid and liquid foods, as evidenced by patient reporting and review of same during [facility] RN CM outreach  [facility] CM Short Term Goal #2 Start Date  11/18/19  [facility] CM Short Term Goal #2 Met Date  12/02/19 [Goal not met]  Interventions for Short Term Goal #2  Confirmed that patient has not initiated food journaling and that he is no longer willing to consider "writing anything down",  see narrative      [place] , RN, BSN, CCRN [employer] Community Care Coordinator [facility] Care Management  [phone]    

## 2019-12-03 ENCOUNTER — Telehealth: Payer: Self-pay | Admitting: Family Medicine

## 2019-12-03 NOTE — Telephone Encounter (Signed)
Spoke to pt and scheduled his awv. He missed his 12/01/19 b/c he thought it was on the 29th. Pt would like call back to reschedule.  Please advise

## 2019-12-08 ENCOUNTER — Encounter (INDEPENDENT_AMBULATORY_CARE_PROVIDER_SITE_OTHER): Payer: Self-pay

## 2019-12-08 ENCOUNTER — Ambulatory Visit (INDEPENDENT_AMBULATORY_CARE_PROVIDER_SITE_OTHER): Payer: Medicare Other

## 2019-12-08 VITALS — Ht 72.0 in | Wt 240.0 lb

## 2019-12-08 DIAGNOSIS — Z0000 Encounter for general adult medical examination without abnormal findings: Secondary | ICD-10-CM | POA: Diagnosis not present

## 2019-12-08 NOTE — Patient Instructions (Addendum)
Danny [name] , Thank you for taking time to come for your Medicare Wellness Visit. I appreciate your ongoing commitment to your health goals. Please review the following plan we discussed and let me know if I can assist you in the future.   These are the goals we discussed: Goals      Patient Stated   . I don't always remember to check my blood sugar (pt-stated)     Keep glucometer with pill box in plain view. Check blood sugar when taking medication.     [name] PharmD "I don't know what I'm doing with my medications" (pt-stated)     CARE PLAN ENTRY (see longtitudinal plan of care for additional care plan information)  Current Barriers:  . Diabetes: uncontrolled; complicated by chronic medical conditions including CHF, CAD, CKD, most recent A1c 11.2%; s/p COVID.   o Appt last week; patient reported BG was >500, recommended ED evaluation. Patient went to ED, BG was 270. Patient left AMA.  o Today, reports he is feeling better. Is at the grocery store running errands during my call, so unable to check BG . Most recent eGFR: 51 mL/min . Current antihyperglycemic regimen: metformin XR 500 mg daily; Jardiance 25 mg, Trulicity 1.5 mg weekly, Tresiba 50 units QAM . Current meal patterns:  o Breakfast: 2 eggs, grits, sometimes water, sometimes milk o Lunch:  . Current blood glucose readings:  o Did not check sugar this morning. Has not checked any sugars since ED visit last week.  . Cardiovascular risk reduction: o Current hypertensive regimen: Carvedilol 12.5 mg BID; furosemide 20 mg QOD + potassium 10 mEq daily (did not realize it was supposed to be every other day); HOLDING lisinopril 2.5 mg o Current hyperlipidemia regimen: atorvastatin 80 mg daily; ezetimibe 10 mg daily  o Current antiplatelet regimen: ASA 81 mg daily, clopidogrel 75 mg daily  . COPD: taking Symbicort 160/4.5 mcg PRN, albuterol PRN  Pharmacist Clinical Goal(s):  [name] Over the next 90 days, patient will work with PharmD and  primary care provider to address optimized medication management  Interventions: . Strongly reiterated importance of blood glucose review and documentation to help guide medication adjustment.  . Mailing patient BG log. Encouraged to check fasting and at least one 2 hour post prandial reading daily, and bring to review.  . Patient last had f/u with PCP >3 months ago. Will collaborate w/ office staff to outreach patient to schedule an appt w/ PCP for chronic disease state follow up.   Patient Self Care Activities:  . Patient will check blood glucose BID, document, and provide at future appointments . Patient will focus on medication adherence by utilizing his BID pill box . Patient will take medications as prescribed  Please see past updates related to this goal by clicking on the "Past Updates" button in the selected goal         This is a list of the screening recommended for you and due dates:  Health Maintenance  Topic Date Due  . COVID-19 Vaccine (1) Never done  . Tetanus Vaccine  Never done  . Colon Cancer Screening  Never done  . Complete foot exam   12/20/2018  . Eye exam for diabetics  04/26/2019  . Hemoglobin A1C  01/28/2020  . Flu Shot  03/06/2020  . Pneumonia vaccines (2 of 2 - PCV13) 08/01/2020  .  Hepatitis C: One time screening is recommended by Center for Disease Control  (CDC) for  adults born from [birth year] through  1965.   Completed

## 2019-12-08 NOTE — Progress Notes (Signed)
Subjective:   Danny Estimable Holloman Sr. is a 69 y.o. male who presents for Medicare Annual/Subsequent preventive examination.  Review of Systems:  No ROS.  Medicare Wellness Virtual Visit.  Visual/audio telehealth visit, UTA vital signs.   Ht/Wt provided. See social history for additional risk factors.   Cardiac Risk Factors include: advanced age (>90mn, >>39women);diabetes mellitus;hypertension;male gender     Objective:    Vitals: Ht 6' (1.829 m)   Wt 240 lb (108.9 kg)   BMI 32.55 kg/m   Body mass index is 32.55 kg/m.  Advanced Directives 12/08/2019 11/18/2019 11/05/2019 07/30/2019 07/29/2019 05/22/2018 03/20/2018  Does Patient Have a Medical Advance Directive? No No No - No No No  Would patient like information on creating a medical advance directive? Yes (MAU/Ambulatory/Procedural Areas - Information given) No - Patient declined - No - Patient declined - No - Patient declined No - Patient declined    Tobacco Social History   Tobacco Use  Smoking Status Former Smoker  . Packs/day: 3.00  . Years: 0.00  . Pack years: 0.00  . Types: Cigarettes  . Quit date: 09/07/2009  . Years since quitting: 10.2  Smokeless Tobacco Never Used  Tobacco Comment   quit june 2011     Counseling given: Not Answered Comment: quit june 2011   Clinical Intake:  Pre-visit preparation completed: Yes        Diabetes: Yes(Followed by pcp)  How often do you need to have someone help you when you read instructions, pamphlets, or other written materials from your doctor or pharmacy?: 3 - Sometimes  Interpreter Needed?: No     Past Medical History:  Diagnosis Date  . CAD (coronary artery disease)    a. 01/2010 PCI of LAD; b. 09/2014 PCI/DES of mLAD due to ISR. D1 80, D2 80(jailed), LCX 552mc. 07/2016 NSTEMI/Cath: LM nl, LAD 2050mR, 40d, D1 90ost, 80p, D2 90ost, RI min irregs, LCX min irregs, OM1 90 small, OM2/3 min irregs, RCA min irregs, RPLB1 90, EF 25-35%.  . Chronic combined systolic and  diastolic CHF (congestive heart failure) ([facility]  a. 07/2016 Echo: EF 30%, severe septal/anterior HK, Gr1 DD.  . CMarland KitchenD (chronic kidney disease), stage III   . COPD (chronic obstructive pulmonary disease) ([facility] . DM2 (diabetes mellitus, type 2) ([facility] . Erectile dysfunction   . HLD (hyperlipidemia)   . HTN (hypertension)   . Ischemic cardiomyopathy    a. 07/2016 Echo: EF 30% w/ sev septal/ant HK. Gr1 DD.   Past Surgical History:  Procedure Laterality Date  . CAD: stent to the LAD    . CARDIAC CATHETERIZATION  10/01/2014  . CARDIAC CATHETERIZATION N/A 07/23/2016   Procedure: Left Heart Cath and Coronary Angiography;  Surgeon: MuhWellington HampshireD;  [facility];  Service: Cardiovascular;  Laterality: N/A;  . CORONARY ANGIOPLASTY WITH STENT PLACEMENT  10/01/2014   Family History  Problem Relation Age of Onset  . Heart attack Father        complications  . Cancer Brother    Social History   Socioeconomic History  . Marital status: Single    Spouse name: Not on file  . Number of children: Not on file  . Years of education: Not on file  . Highest education level: Not on file  Occupational History  . Not on file  Tobacco Use  . Smoking status: Former Smoker    Packs/day: 3.00    Years: 0.00  Pack years: 0.00    Types: Cigarettes    Quit date: 09/07/2009    Years since quitting: 10.2  . Smokeless tobacco: Never Used  . Tobacco comment: quit june 2011  Substance and Sexual Activity  . Alcohol use: Yes    Comment: every other weekend  . Drug use: No  . Sexual activity: Not on file  Other Topics Concern  . Not on file  Social History Narrative  . Not on file   Social Determinants of Health   Financial Resource Strain:   . Difficulty of Paying Living Expenses:   Food Insecurity: No Food Insecurity  . Worried About Charity fundraiser in the Last Year: Never true  . Ran Out of Food in the Last Year: Never true  Transportation Needs: No Transportation  Needs  . Lack of Transportation (Medical): No  . Lack of Transportation (Non-Medical): No  Physical Activity:   . Days of Exercise per Week:   . Minutes of Exercise per Session:   Stress:   . Feeling of Stress :   Social Connections:   . Frequency of Communication with Friends and Family:   . Frequency of Social Gatherings with Friends and Family:   . Attends Religious Services:   . Active Member of Clubs or Organizations:   . Attends Archivist Meetings:   [name] Marital Status:     Outpatient Encounter Medications as of 12/08/2019  Medication Sig  . ACCU-CHEK GUIDE test strip CHECK CBGS 4 TIMES A DAY  . acetaminophen (TYLENOL) 325 MG tablet Take 650 mg by mouth every 6 (six) hours as needed.  [name] aspirin 81 MG tablet Take 81 mg by mouth daily.    [name] atorvastatin (LIPITOR) 80 MG tablet Take 1 tablet (80 mg total) by mouth daily.  . blood glucose meter kit and supplies KIT Dispense based on patient and insurance preference. Check CBGs 4 times daily. Dx code E11.9.  [name] carvedilol (COREG) 12.5 MG tablet Take 1 tablet (12.5 mg total) by mouth 2 (two) times daily with a meal.  . clopidogrel (PLAVIX) 75 MG tablet Take 1 tablet (75 mg total) by mouth daily.  . empagliflozin (JARDIANCE) 25 MG TABS tablet Take 25 mg by mouth daily.  [name] escitalopram (LEXAPRO) 10 MG tablet TAKE 1 TABLET BY MOUTH EVERY DAY  . ezetimibe (ZETIA) 10 MG tablet Take 1 tablet (10 mg total) by mouth daily.  . furosemide (LASIX) 20 MG tablet TAKE 1 TABLET BY MOUTH EVERY OTHER DAY Please schedule office visit for further refills. Thank you!  . Insulin Pen Needle (PEN NEEDLES) 32G X 6 MM MISC 1 each by Does not apply route 2 (two) times daily.  . isosorbide mononitrate (IMDUR) 30 MG 24 hr tablet Take 1 tablet (30 mg total) by mouth daily.  [name] lisinopril (ZESTRIL) 2.5 MG tablet Take 1 tablet (2.5 mg total) by mouth daily. (Patient not taking: Reported on 11/05/2019)  . metFORMIN (GLUCOPHAGE XR) 500 MG 24 hr tablet Take 1  tablet (500 mg total) by mouth 2 (two) times daily.  . nitroGLYCERIN (NITROSTAT) 0.4 MG SL tablet Place 1 tablet (0.4 mg total) under the tongue every 5 (five) minutes as needed for chest pain.  . potassium chloride (MICRO-K) 10 MEQ CR capsule Take 1 tablet (10 meq) by mouth once EVERY OTHER day with lasix (furosemide)  . SYMBICORT 160-4.5 MCG/ACT inhaler TAKE 2 PUFFS BY MOUTH TWICE A DAY  . tamsulosin (FLOMAX) 0.4 MG CAPS capsule TAKE 1 CAPSULE  BY MOUTH EVERY DAY  . TRESIBA FLEXTOUCH 100 UNIT/ML FlexTouch Pen INJECT (45 UNITS TOTAL) INTO THE SKIN DAILY.  . TRULICITY 1.5 PY/0.9XI SOPN INJECT 1.5 MG INTO THE SKIN ONCE A WEEK  . VENTOLIN HFA 108 (90 Base) MCG/ACT inhaler Inhale 2 puffs into the lungs every 6 (six) hours as needed for wheezing or shortness of breath.   No facility-administered encounter medications on file as of 12/08/2019.    Activities of Daily Living In your present state of health, do you have any difficulty performing the following activities: 12/08/2019 11/18/2019  Hearing? N -  Vision? N -  Difficulty concentrating or making decisions? Y -  Walking or climbing stairs? Ullin himself with activity. -  Dressing or bathing? N N  Doing errands, shopping? 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 N N  Using the Toilet? N -  In the past six months, have you accidently leaked urine? N -  Do you have problems with loss of bowel control? N -  Managing your Medications? Y Y  Comment Pharm D assist [facility] CM Chronic CM Pharmacist embedded in patient's PCP office currently involved in patient's care  Managing your Finances? N N  Housekeeping or managing your Housekeeping? N -  Some recent data might be hidden    Patient Care Team: [name], MD as PCP - General (Family Medicine) Rockey Situ, Kathlene November, MD as Consulting Physician (Cardiology) De Hollingshead, [facility] (Pharmacist)   Assessment:   This is a routine wellness examination for  Dymond.  Nurse connected with patient 12/08/19 at 11:30 AM EDT by a telephone enabled telemedicine application and verified that I am speaking with the correct person using two identifiers. Patient stated full name and DOB. Patient gave permission to continue with virtual visit. Patient's location was at home and Nurse's location was at [place] office.   Patient is alert and oriented x3. Patient notes some difficulty with memory and concentrating.  Health Maintenance Due: -Tdap vaccine-  Deferred per patient preference.  - Covid vaccine- declined   -Eye Exam- plans to schedule -Foot Exam- followed by pcp.  See completed HM at the end of note.   Eye: Visual acuity not assessed. Virtual visit. Glasses needed when reading.   Dental: Dentures- yes  Hearing: Demonstrates normal hearing during visit.  Safety:  Patient feels safe at home- yes Patient does have smoke detectors at home- yes Patient does wear sunscreen or protective clothing when in direct sunlight - yes Patient does wear seat belt when in a moving vehicle - yes Patient drives- yes Adequate lighting in walkways free from debris- yes Grab bars and handrails used as appropriate- yes Ambulates with an assistive device- no Cell phone on person when ambulating outside of the home- yes  Social: Alcohol intake - yes; notes he has not had a drink in 2-3 months   Smoking history- former  Smokers in home? none Illicit drug use? none  Medication: Taking as directed and without issues.  Pill box in use -yes  Self managed - yes   Covid-19: Precautions and sickness symptoms discussed. Wears mask, social distancing, hand hygiene as appropriate.   Activities of Daily Living Patient denies needing assistance with: household chores, feeding themselves, getting from bed to chair, getting to the toilet, bathing/showering, dressing, managing money, or preparing meals.   Discussed the importance of a healthy diet, water intake and  the benefits of aerobic exercise.  Physical activity- no routine. Encouraged to stay active.   Diet:  Regular; encouraged low carb Water: good intake   Other Providers Patient Care Team: [name], MD as PCP - General (Family Medicine) Rockey Situ Kathlene November, MD as Consulting Physician (Cardiology) De Hollingshead, [facility] (Pharmacist)  Exercise Activities and Dietary recommendations Current Exercise Habits: The patient does not participate in regular exercise at present  Goals      Patient Stated   . I don't always remember to check my blood sugar (pt-stated)     Keep glucometer with pill box in plain view. Check blood sugar when taking medication.     [name] PharmD "I don't know what I'm doing with my medications" (pt-stated)     CARE PLAN ENTRY (see longtitudinal plan of care for additional care plan information)  Current Barriers:  . Diabetes: uncontrolled; complicated by chronic medical conditions including CHF, CAD, CKD, most recent A1c 11.2%; s/p COVID.   o Appt last week; patient reported BG was >500, recommended ED evaluation. Patient went to ED, BG was 270. Patient left AMA.  o Today, reports he is feeling better. Is at the grocery store running errands during my call, so unable to check BG . Most recent eGFR: 51 mL/min . Current antihyperglycemic regimen: metformin XR 500 mg daily; Jardiance 25 mg, Trulicity 1.5 mg weekly, Tresiba 50 units QAM . Current meal patterns:  o Breakfast: 2 eggs, grits, sometimes water, sometimes milk o Lunch:  . Current blood glucose readings:  o Did not check sugar this morning. Has not checked any sugars since ED visit last week.  . Cardiovascular risk reduction: o Current hypertensive regimen: Carvedilol 12.5 mg BID; furosemide 20 mg QOD + potassium 10 mEq daily (did not realize it was supposed to be every other day); HOLDING lisinopril 2.5 mg o Current hyperlipidemia regimen: atorvastatin 80 mg daily; ezetimibe 10 mg daily  o Current  antiplatelet regimen: ASA 81 mg daily, clopidogrel 75 mg daily  . COPD: taking Symbicort 160/4.5 mcg PRN, albuterol PRN  Pharmacist Clinical Goal(s):  [name] Over the next 90 days, patient will work with PharmD and primary care provider to address optimized medication management  Interventions: . Strongly reiterated importance of blood glucose review and documentation to help guide medication adjustment.  . Mailing patient BG log. Encouraged to check fasting and at least one 2 hour post prandial reading daily, and bring to review.  . Patient last had f/u with PCP >3 months ago. Will collaborate w/ office staff to outreach patient to schedule an appt w/ PCP for chronic disease state follow up.   Patient Self Care Activities:  . Patient will check blood glucose BID, document, and provide at future appointments . Patient will focus on medication adherence by utilizing his BID pill box . Patient will take medications as prescribed  Please see past updates related to this goal by clicking on the "Past Updates" button in the selected goal         Fall Risk Fall Risk  12/08/2019 11/18/2019 08/06/2019 09/01/2018 03/20/2018  Falls in the past year? (No Data) 1 0 1 No  Comment None since reported 2 weeks ago - - - -  Number falls in past yr: - 0 0 0 -  Injury with Fall? - 0 - 0 -  Risk for fall due to : History of fall(s) History of fall(s);Medication side effect - - -  Follow up Falls evaluation completed Falls prevention discussed - - -  Timed Get Up and Go Performed: no, virtual visit  Depression Screen PHQ 2/9 Scores 12/08/2019 11/18/2019 08/06/2019 09/01/2018  PHQ - 2 Score 1 1 0 1    Cognitive Function     6CIT Screen 12/08/2019 03/20/2018  What Year? 0 points 0 points  What month? 0 points 0 points  What time? 0 points 0 points  Count back from 20 0 points 0 points  Months in reverse 4 points 4 points  Repeat phrase 4 points -  Total Score 8 -    Immunization History  Administered  Date(s) Administered  . Fluad Quad(high Dose 65+) 08/02/2019  . Influenza, High Dose Seasonal PF 09/01/2018  . Pneumococcal Polysaccharide-23 08/02/2019   Screening Tests Health Maintenance  Topic Date Due  . COVID-19 Vaccine (1) Never done  . TETANUS/TDAP  Never done  . COLONOSCOPY  Never done  . FOOT EXAM  12/20/2018  . OPHTHALMOLOGY EXAM  04/26/2019  . HEMOGLOBIN A1C  01/28/2020  . INFLUENZA VACCINE  03/06/2020  . PNA vac Low Risk Adult (2 of 2 - PCV13) 08/01/2020  . Hepatitis C Screening  Completed       Plan:   Keep all routine maintenance appointments.   Next scheduled CCM telephone visit- 12/21/19 @ 10:45.   PCP follow up scheduled 12/28/19 @ 10:30  Medicare Attestation I have personally reviewed: The patient's medical and social history Their use of alcohol, tobacco or illicit drugs Their current medications and supplements The patient's functional ability including ADLs,fall risks, home safety risks, cognitive, and hearing and visual impairment Diet and physical activities Evidence for depression   I have reviewed and discussed with patient certain preventive protocols, quality metrics, and best practice recommendations.     Varney Biles, LPN  02/08/8831

## 2019-12-08 NOTE — Progress Notes (Signed)
I have reviewed the above note and agree.  Tinleigh Whitmire, M.D.  

## 2019-12-21 ENCOUNTER — Telehealth: Payer: Medicare Other

## 2019-12-21 ENCOUNTER — Ambulatory Visit: Payer: Self-pay | Admitting: Pharmacist

## 2019-12-21 NOTE — Chronic Care Management (AMB) (Signed)
  Chronic Care Management   Note  12/21/2019 Name: Danny Casebeer Shrout Sr. MRN: [id number] DOB: February 28, 1951  Danny Friends Oleksy Sr. is a 69 y.o. year old male who is a primary care patient of [name], Yehuda Mao, MD. The CCM team was consulted for assistance with chronic disease management and care coordination needs.    Attempted to contact patient for medication management review. Left HIPAA compliant message for patient to return my call at their convenience.   Plan: - Will collaborate with Care Guide to outreach to schedule follow up with me   Catie Feliz Beam, PharmD, [place], CPP Clinical Pharmacist [facility] [employer] [phone]

## 2019-12-22 ENCOUNTER — Emergency Department
Admission: EM | Admit: 2019-12-22 | Discharge: 2019-12-22 | Disposition: A | Discharge status: Home / Self Care | Payer: Medicare Other | Attending: Emergency Medicine | Admitting: Emergency Medicine

## 2019-12-22 ENCOUNTER — Encounter: Payer: Self-pay | Admitting: Emergency Medicine

## 2019-12-22 ENCOUNTER — Telehealth: Payer: Self-pay | Admitting: Family Medicine

## 2019-12-22 ENCOUNTER — Other Ambulatory Visit: Payer: Self-pay

## 2019-12-22 DIAGNOSIS — R531 Weakness: Secondary | ICD-10-CM | POA: Diagnosis not present

## 2019-12-22 DIAGNOSIS — Z743 Need for continuous supervision: Secondary | ICD-10-CM | POA: Diagnosis not present

## 2019-12-22 DIAGNOSIS — Z7982 Long term (current) use of aspirin: Secondary | ICD-10-CM | POA: Insufficient documentation

## 2019-12-22 DIAGNOSIS — E860 Dehydration: Secondary | ICD-10-CM | POA: Insufficient documentation

## 2019-12-22 DIAGNOSIS — E1165 Type 2 diabetes mellitus with hyperglycemia: Secondary | ICD-10-CM | POA: Diagnosis not present

## 2019-12-22 DIAGNOSIS — R7989 Other specified abnormal findings of blood chemistry: Secondary | ICD-10-CM | POA: Diagnosis not present

## 2019-12-22 DIAGNOSIS — I130 Hypertensive heart and chronic kidney disease with heart failure and stage 1 through stage 4 chronic kidney disease, or unspecified chronic kidney disease: Secondary | ICD-10-CM | POA: Insufficient documentation

## 2019-12-22 DIAGNOSIS — R42 Dizziness and giddiness: Secondary | ICD-10-CM | POA: Diagnosis not present

## 2019-12-22 DIAGNOSIS — R739 Hyperglycemia, unspecified: Secondary | ICD-10-CM

## 2019-12-22 DIAGNOSIS — Z794 Long term (current) use of insulin: Secondary | ICD-10-CM | POA: Insufficient documentation

## 2019-12-22 DIAGNOSIS — Z87891 Personal history of nicotine dependence: Secondary | ICD-10-CM | POA: Diagnosis not present

## 2019-12-22 DIAGNOSIS — I2510 Atherosclerotic heart disease of native coronary artery without angina pectoris: Secondary | ICD-10-CM | POA: Insufficient documentation

## 2019-12-22 DIAGNOSIS — I5042 Chronic combined systolic (congestive) and diastolic (congestive) heart failure: Secondary | ICD-10-CM | POA: Diagnosis not present

## 2019-12-22 DIAGNOSIS — Z79899 Other long term (current) drug therapy: Secondary | ICD-10-CM | POA: Insufficient documentation

## 2019-12-22 DIAGNOSIS — N183 Chronic kidney disease, stage 3 unspecified: Secondary | ICD-10-CM | POA: Diagnosis not present

## 2019-12-22 DIAGNOSIS — R0902 Hypoxemia: Secondary | ICD-10-CM | POA: Diagnosis not present

## 2019-12-22 DIAGNOSIS — I959 Hypotension, unspecified: Secondary | ICD-10-CM | POA: Diagnosis not present

## 2019-12-22 LAB — BASIC METABOLIC PANEL
Anion gap: 11 (ref 5–15)
BUN: 37 mg/dL — ABNORMAL HIGH (ref 8–23)
CO2: 24 mmol/L (ref 22–32)
Calcium: 8.9 mg/dL (ref 8.9–10.3)
Chloride: 101 mmol/L (ref 98–111)
Creatinine, Ser: 1.83 mg/dL — ABNORMAL HIGH (ref 0.61–1.24)
GFR calc Af Amer: 43 mL/min — ABNORMAL LOW (ref >60)
GFR calc non Af Amer: 37 mL/min — ABNORMAL LOW (ref >60)
Glucose, Bld: 325 mg/dL — ABNORMAL HIGH (ref 70–99)
Potassium: 4 mmol/L (ref 3.5–5.1)
Sodium: 136 mmol/L (ref 135–145)

## 2019-12-22 LAB — CBC
HCT: 51.4 % (ref 39.0–52.0)
Hemoglobin: 17.3 g/dL — ABNORMAL HIGH (ref 13.0–17.0)
MCH: 31.3 pg (ref 26.0–34.0)
MCHC: 33.7 g/dL (ref 30.0–36.0)
MCV: 93.1 fL (ref 80.0–100.0)
Platelets: 260 10*3/uL (ref 150–400)
RBC: 5.52 MIL/uL (ref 4.22–5.81)
RDW: 11.9 % (ref 11.5–15.5)
WBC: 10.3 10*3/uL (ref 4.0–10.5)
nRBC: 0 % (ref 0.0–0.2)

## 2019-12-22 LAB — GLUCOSE, CAPILLARY: Glucose-Capillary: 307 mg/dL — ABNORMAL HIGH (ref 70–99)

## 2019-12-22 LAB — TROPONIN I (HIGH SENSITIVITY): Troponin I (High Sensitivity): 31 ng/L — ABNORMAL HIGH (ref <18)

## 2019-12-22 MED ORDER — SODIUM CHLORIDE 0.9% FLUSH: 3.0000 mL | Freq: Once | INTRAVENOUS | Status: DC

## 2019-12-22 NOTE — ED Provider Notes (Signed)
[facility] Emergency Department Provider Note  ____________________________________________   I have reviewed the triage vital signs and the nursing notes.   HISTORY  Chief Complaint Weakness and Hypotension   History limited by: Not Limited   HPI Danny KIRCHGESSNER Sr. is a 69 y.o. male who presents to the emergency department today because of concerns for weakness and low blood pressure.  The patient states that he has felt bad for the past few days.  He has noticed that he has felt weak.  He also states he has had a hard time sleeping.  Patient thought that it could be because his sugar levels were low however when he checked them his sugars were high.  He then found that his blood pressure was low.  Patient denies any fevers, chest pain or shortness of breath.  Records reviewed. Per medical record review patient has a history of CAD, CKD, COPD.   Past Medical History:  Diagnosis Date  . CAD (coronary artery disease)    a. 01/2010 PCI of LAD; b. 09/2014 PCI/DES of mLAD due to ISR. D1 80, D2 80(jailed), LCX 83m c. 07/2016 NSTEMI/Cath: LM nl, LAD 263mSR, 40d, D1 90ost, 80p, D2 90ost, RI min irregs, LCX min irregs, OM1 90 small, OM2/3 min irregs, RCA min irregs, RPLB1 90, EF 25-35%.  . Chronic combined systolic and diastolic CHF (congestive heart failure) (HCNew Concord   a. 07/2016 Echo: EF 30%, severe septal/anterior HK, Gr1 DD.  . Marland KitchenKD (chronic kidney disease), stage III   . COPD (chronic obstructive pulmonary disease) (HCMecca  . DM2 (diabetes mellitus, type 2) (HCLowell  . Erectile dysfunction   . HLD (hyperlipidemia)   . HTN (hypertension)   . Ischemic cardiomyopathy    a. 07/2016 Echo: EF 30% w/ sev septal/ant HK. Gr1 DD.    Patient Active Problem List   Diagnosis Date Noted  . Acute hypoxemic respiratory failure due to COVID-19 (HCWhite Mesa12/23/2020  . Coronary artery disease of native artery of native heart with stable angina pectoris (HCShoshoni05/03/2019  .  Centrilobular emphysema (HCHaigler Creek05/03/2019  . Pure hypercholesterolemia 12/12/2018  . Respiratory illness 10/31/2018  . Chest pain 10/31/2018  . Atherosclerosis of native arteries of extremity with intermittent claudication (HCMerritt Park07/04/2018  . Decreased pedal pulses 07/02/2017  . Orthostasis 05/07/2017  . Anxiety and depression 05/07/2017  . CKD (chronic kidney disease) stage 3, GFR 30-59 ml/min 03/06/2017  . Diabetic neuropathy (HCNormangee08/08/2016  . Chronic combined systolic and diastolic CHF (congestive heart failure) (HCMiddlebourne07/31/2018  . Morbid obesity (HCThomson11/13/2017  . H/O medication noncompliance 06/18/2016  . COPD (chronic obstructive pulmonary disease) (HCBunker Hill Village11/14/2016  . GERD (gastroesophageal reflux disease) 12/20/2014  . Diabetes mellitus type 2, uncontrolled, with complications ([id number]. Hyperlipidemia 02/10/2010  . Essential hypertension 02/10/2010  . CAD (coronary artery disease) 02/10/2010    Past Surgical History:  Procedure Laterality Date  . CAD: stent to the LAD    . CARDIAC CATHETERIZATION  10/01/2014  . CARDIAC CATHETERIZATION N/A 07/23/2016   Procedure: Left Heart Cath and Coronary Angiography;  Surgeon: MuWellington HampshireMD;  [facility];  Service: Cardiovascular;  Laterality: N/A;  . CORONARY ANGIOPLASTY WITH STENT PLACEMENT  10/01/2014    Prior to Admission medications   Medication Sig Start Date End Date Taking? Authorizing Provider  ACCU-CHEK GUIDE test strip CHECK CBGS 4 TIMES A DAY 11/16/19   SoLeone HavenMD  acetaminophen (TYLENOL) 325 MG tablet Take  650 mg by mouth every 6 (six) hours as needed.    [provider]  aspirin 81 MG tablet Take 81 mg by mouth daily.      [provider]  atorvastatin (LIPITOR) 80 MG tablet Take 1 tablet (80 mg total) by mouth daily. 09/17/19   [name], MD  blood glucose meter kit and supplies KIT Dispense based on patient and insurance preference. Check CBGs 4  times daily. Dx code E11.9. 09/17/19   [name], MD  carvedilol (COREG) 12.5 MG tablet Take 1 tablet (12.5 mg total) by mouth 2 (two) times daily with a meal. 12/12/18   Gollan, Kathlene November, MD  clopidogrel (PLAVIX) 75 MG tablet Take 1 tablet (75 mg total) by mouth daily. 12/12/18   [name], MD  empagliflozin (JARDIANCE) 25 MG TABS tablet Take 25 mg by mouth daily. 09/17/19   [name], MD  escitalopram (LEXAPRO) 10 MG tablet TAKE 1 TABLET BY MOUTH EVERY DAY 11/05/19   Einar Pheasant, MD  ezetimibe (ZETIA) 10 MG tablet Take 1 tablet (10 mg total) by mouth daily. 12/12/18   [name], MD  furosemide (LASIX) 20 MG tablet TAKE 1 TABLET BY MOUTH EVERY OTHER DAY Please schedule office visit for further refills. Thank you! 08/26/19   [name], MD  Insulin Pen Needle (PEN NEEDLES) 32G X 6 MM MISC 1 each by Does not apply route 2 (two) times daily. 11/17/19   [name], MD  isosorbide mononitrate (IMDUR) 30 MG 24 hr tablet Take 1 tablet (30 mg total) by mouth daily. 12/12/18   [name], MD  lisinopril (ZESTRIL) 2.5 MG tablet Take 1 tablet (2.5 mg total) by mouth daily. Patient not taking: Reported on 11/05/2019 09/17/19   [name], MD  metFORMIN (GLUCOPHAGE XR) 500 MG 24 hr tablet Take 1 tablet (500 mg total) by mouth 2 (two) times daily. 04/28/19   [name], MD  nitroGLYCERIN (NITROSTAT) 0.4 MG SL tablet Place 1 tablet (0.4 mg total) under the tongue every 5 (five) minutes as needed for chest pain. 12/12/18   [name], MD  potassium chloride (MICRO-K) 10 MEQ CR capsule Take 1 tablet (10 meq) by mouth once EVERY OTHER day with lasix (furosemide) 12/12/18   Gollan, Kathlene November, MD  SYMBICORT 160-4.5 MCG/ACT inhaler TAKE 2 PUFFS BY MOUTH TWICE A DAY 08/26/19   [name], MD  tamsulosin (FLOMAX) 0.4 MG CAPS capsule TAKE 1 CAPSULE BY MOUTH EVERY DAY 11/05/19   Einar Pheasant, MD  TRESIBA FLEXTOUCH 100 UNIT/ML FlexTouch Pen INJECT (45  UNITS TOTAL) INTO THE SKIN DAILY. 11/16/19   [name], MD  TRULICITY 1.5 FB/5.1WC SOPN INJECT 1.5 MG INTO THE SKIN ONCE A WEEK 08/31/19   [name], MD  VENTOLIN HFA 108 [phone] Base) MCG/ACT inhaler Inhale 2 puffs into the lungs every 6 (six) hours as needed for wheezing or shortness of breath. 09/17/19   [name], MD    Allergies Patient has no known allergies.  Family History  Problem Relation Age of Onset  . Heart attack Father        complications  . Cancer Brother     Social History Social History   Tobacco Use  . Smoking status: Former Smoker    Packs/day: 3.00    Years: 0.00    Pack years: 0.00    Types: Cigarettes    Quit date: 09/07/2009  Years since quitting: 10.2  . Smokeless tobacco: Never Used  . Tobacco comment: quit june 2011  Substance Use Topics  . Alcohol use: Yes    Comment: every other weekend  . Drug use: No    Review of Systems Constitutional: No fever/chills. Positive for weakness.  Eyes: No visual changes. ENT: No sore throat. Cardiovascular: Denies chest pain. Respiratory: Denies shortness of breath. Gastrointestinal: No abdominal pain.  No nausea, no vomiting.  No diarrhea.   Genitourinary: Negative for dysuria. Musculoskeletal: Negative for back pain. Skin: Negative for rash. Neurological: Negative for headaches, focal weakness or numbness.  ____________________________________________   PHYSICAL EXAM:  VITAL SIGNS: ED Triage Vitals [12/22/19 1735]  Enc Vitals Group     BP 99/63     Pulse Rate (!) 115     Resp (!) 22     Temp 98 F (36.7 C)     Temp Source Oral     SpO2 96 %     Weight 240 lb (108.9 kg)     Height 6' (1.829 m)     Head Circumference      Peak Flow      Pain Score 0   Constitutional: Alert and oriented.  Eyes: Conjunctivae are normal.  ENT      Head: Normocephalic and atraumatic.      Nose: No congestion/rhinnorhea.      Mouth/Throat: Mucous membranes are moist.      Neck: No  stridor. Hematological/Lymphatic/Immunilogical: No cervical lymphadenopathy. Cardiovascular: Normal rate, regular rhythm.  No murmurs, rubs, or gallops.  Respiratory: Normal respiratory effort without tachypnea nor retractions. Breath sounds are clear and equal bilaterally. No wheezes/rales/rhonchi. Gastrointestinal: Soft and non tender. No rebound. No guarding.  Genitourinary: Deferred Musculoskeletal: Normal range of motion in all extremities. No lower extremity edema. Neurologic:  Normal speech and language. No gross focal neurologic deficits are appreciated.  Skin:  Skin is warm, dry and intact. No rash noted. Psychiatric: Mood and affect are normal. Speech and behavior are normal. Patient exhibits appropriate insight and judgment.  ____________________________________________    LABS (pertinent positives/negatives)  CBC wbc 10.3, hgb 17.3, plt 260 BMP na 136, k 4.0, glu 325, cr 1.83  ____________________________________________   EKG  I, Nance Pear, attending physician, personally viewed and interpreted this EKG  EKG Time: 1740 Rate: 116 Rhythm: sinus tachycardia Axis: normal Intervals: qtc 442 QRS: narrow, q waves v1 ST changes: no st elevation Impression: abnormal ekg   ____________________________________________    RADIOLOGY  None   ____________________________________________   PROCEDURES  Procedures  ____________________________________________   INITIAL IMPRESSION / ASSESSMENT AND PLAN / ED COURSE  Pertinent labs & imaging results that were available during my care of the patient were reviewed by me and considered in my medical decision making (see chart for details).   Patient presented to the emergency department today with complaints for weakness and feeling bad for the past few days.  Patient denies any chest pain or fevers.  Blood work was notable for elevated sugar and slight elevation of the creatinine.  I discussed this with the  patient.  I do have concerns that the patient has dehydrated at this point.  It could be related to the hyperglycemia.  I discussed this with the patient and suggested IV fluids.  I did discuss my concern for kidney damage.  Patient however stated that he would like to be discharged home.  At this point patient has capacity and certainly can make that decision.  Again I  reiterated the concern for kidney injury.  I discussed with patient importance of close follow-up with primary care doctor for repeat blood work.  Patient states he can contact them tomorrow.  ___________________________________________   FINAL CLINICAL IMPRESSION(S) / ED DIAGNOSES  Final diagnoses:  Dehydration  Weakness  Elevated serum creatinine  Hyperglycemia     Note: This dictation was prepared with Dragon dictation. Any transcriptional errors that result from this process are unintentional     Nance Pear, MD 12/22/19 2219

## 2019-12-22 NOTE — Discharge Instructions (Addendum)
Please seek medical attention for any high fevers, chest pain, shortness of breath, change in behavior, persistent vomiting, bloody stool or any other new or concerning symptoms.  

## 2019-12-22 NOTE — ED Triage Notes (Addendum)
Pt comes into the ED via EMS from home with c/o not feeling well for the past 3-4 days, CBG 269, 80/50b/p,. 120ST, given b/p 100/60...states he is just feeling weak and dizzy. #[id number]

## 2019-12-22 NOTE — ED Triage Notes (Signed)
See first RN Note; Pt c/o weakness x several days. Pt states, "that lady on the ambulance said my blood pressure was low but I thought it was my sugar".

## 2019-12-22 NOTE — ED Notes (Signed)
ED Provider at bedside. 

## 2019-12-22 NOTE — Chronic Care Management (AMB) (Signed)
  Care Management   Note  12/22/2019 Name: Ridhaan Dreibelbis Reznick Sr. MRN: [id number] DOB: Apr 02, 1951  Danny Friends Glasco Sr. is a 69 y.o. year old male who is a primary care patient of [name], Yehuda Mao, MD and is actively engaged with the care management team. I reached out to Bennett Scrape Sr. by phone today to assist with re-scheduling a follow up visit with the Pharmacist  Follow up plan: Telephone appointment with care management team member scheduled for:01/08/2020  Penne Lash, RMA Care Guide, Embedded Care Coordination [facility]  [place], [place] [address] Direct Dial: [phone] [email or web address]

## 2019-12-24 ENCOUNTER — Other Ambulatory Visit: Payer: Self-pay

## 2019-12-24 ENCOUNTER — Emergency Department
Admission: EM | Admit: 2019-12-24 | Discharge: 2019-12-24 | Disposition: A | Discharge status: Home / Self Care | Payer: Medicare Other | Attending: Emergency Medicine | Admitting: Emergency Medicine

## 2019-12-24 ENCOUNTER — Telehealth: Payer: Self-pay | Admitting: Family Medicine

## 2019-12-24 DIAGNOSIS — E1165 Type 2 diabetes mellitus with hyperglycemia: Secondary | ICD-10-CM | POA: Diagnosis not present

## 2019-12-24 DIAGNOSIS — R42 Dizziness and giddiness: Secondary | ICD-10-CM | POA: Insufficient documentation

## 2019-12-24 DIAGNOSIS — I959 Hypotension, unspecified: Secondary | ICD-10-CM | POA: Insufficient documentation

## 2019-12-24 DIAGNOSIS — R531 Weakness: Secondary | ICD-10-CM | POA: Diagnosis not present

## 2019-12-24 DIAGNOSIS — Z743 Need for continuous supervision: Secondary | ICD-10-CM | POA: Diagnosis not present

## 2019-12-24 DIAGNOSIS — Z5321 Procedure and treatment not carried out due to patient leaving prior to being seen by health care provider: Secondary | ICD-10-CM | POA: Insufficient documentation

## 2019-12-24 LAB — CBC
HCT: 48.8 % (ref 39.0–52.0)
Hemoglobin: 15.9 g/dL (ref 13.0–17.0)
MCH: 30.8 pg (ref 26.0–34.0)
MCHC: 32.6 g/dL (ref 30.0–36.0)
MCV: 94.6 fL (ref 80.0–100.0)
Platelets: 249 10*3/uL (ref 150–400)
RBC: 5.16 MIL/uL (ref 4.22–5.81)
RDW: 12 % (ref 11.5–15.5)
WBC: 8 10*3/uL (ref 4.0–10.5)
nRBC: 0 % (ref 0.0–0.2)

## 2019-12-24 LAB — BASIC METABOLIC PANEL
Anion gap: 13 (ref 5–15)
BUN: 45 mg/dL — ABNORMAL HIGH (ref 8–23)
CO2: 23 mmol/L (ref 22–32)
Calcium: 8.7 mg/dL — ABNORMAL LOW (ref 8.9–10.3)
Chloride: 102 mmol/L (ref 98–111)
Creatinine, Ser: 2.09 mg/dL — ABNORMAL HIGH (ref 0.61–1.24)
GFR calc Af Amer: 37 mL/min — ABNORMAL LOW (ref >60)
GFR calc non Af Amer: 32 mL/min — ABNORMAL LOW (ref >60)
Glucose, Bld: 407 mg/dL — ABNORMAL HIGH (ref 70–99)
Potassium: 4.3 mmol/L (ref 3.5–5.1)
Sodium: 138 mmol/L (ref 135–145)

## 2019-12-24 LAB — URINALYSIS, COMPLETE (UACMP) WITH MICROSCOPIC
Bacteria, UA: NONE SEEN
Bilirubin Urine: NEGATIVE
Glucose, UA: >=500 mg/dL — AB
Hgb urine dipstick: NEGATIVE
Ketones, ur: NEGATIVE mg/dL
Leukocytes,Ua: NEGATIVE
Nitrite: NEGATIVE
Protein, ur: 30 mg/dL — AB
Specific Gravity, Urine: 1.023 (ref 1.005–1.030)
Squamous Epithelial / HPF: NONE SEEN (ref 0–5)
pH: 5 (ref 5.0–8.0)

## 2019-12-24 LAB — TROPONIN I (HIGH SENSITIVITY): Troponin I (High Sensitivity): 25 ng/L — ABNORMAL HIGH (ref <18)

## 2019-12-24 NOTE — Telephone Encounter (Signed)
Patient says he is dizzy and feels like he is not in this world, and feels short of breath says he feels really tired and can't hardly go..  Did not take his Trulicity today or he is not sure if he took his [place] today he feels one of his medications is causing this. Says feels like he is going in and out we have 911 in route. Verified EMTS on site.

## 2019-12-24 NOTE — ED Notes (Signed)
First nurse note: Pt to the er via EMS for generalized weakness since this morning, seen here Monday for same, dx hypotension, having same symptoms, dizziness when standing, bp 158/86, heart rate 110, BG 398. 12 lead sinus rhythm.

## 2019-12-24 NOTE — ED Triage Notes (Signed)
Pt arrived by ems for feeling "dizzy" since this am.  No focal weakness with this, no facial droop.

## 2019-12-24 NOTE — ED Notes (Signed)
Pt denies any visual changes with this other than visual changes related to his cataracts.  Pt states that he is not taking his prescription meds as prescribed.  No focal weakness or neuro deficits.  Pt reports that the dizziness is with movement of head and movement in position.  No dizziness when he is still

## 2019-12-25 ENCOUNTER — Telehealth: Payer: Self-pay | Admitting: Emergency Medicine

## 2019-12-25 NOTE — Telephone Encounter (Signed)
Noted  

## 2019-12-25 NOTE — Telephone Encounter (Signed)
Called patient due to lwot to inquire about condition and follow up plans.  No answer and no voicemail  

## 2019-12-28 ENCOUNTER — Other Ambulatory Visit: Payer: Self-pay

## 2019-12-28 ENCOUNTER — Encounter: Payer: Self-pay | Admitting: Family Medicine

## 2019-12-28 ENCOUNTER — Ambulatory Visit (INDEPENDENT_AMBULATORY_CARE_PROVIDER_SITE_OTHER): Payer: Medicare Other | Admitting: Pharmacist

## 2019-12-28 ENCOUNTER — Ambulatory Visit (INDEPENDENT_AMBULATORY_CARE_PROVIDER_SITE_OTHER): Payer: Medicare Other | Admitting: Family Medicine

## 2019-12-28 VITALS — BP 130/80 | HR 91 | Ht 72.0 in | Wt 238.0 lb

## 2019-12-28 DIAGNOSIS — E1165 Type 2 diabetes mellitus with hyperglycemia: Secondary | ICD-10-CM

## 2019-12-28 DIAGNOSIS — E118 Type 2 diabetes mellitus with unspecified complications: Secondary | ICD-10-CM

## 2019-12-28 DIAGNOSIS — J432 Centrilobular emphysema: Secondary | ICD-10-CM | POA: Diagnosis not present

## 2019-12-28 DIAGNOSIS — I10 Essential (primary) hypertension: Secondary | ICD-10-CM | POA: Diagnosis not present

## 2019-12-28 DIAGNOSIS — N179 Acute kidney failure, unspecified: Secondary | ICD-10-CM | POA: Diagnosis not present

## 2019-12-28 DIAGNOSIS — IMO0002 Reserved for concepts with insufficient information to code with codable children: Secondary | ICD-10-CM

## 2019-12-28 LAB — BASIC METABOLIC PANEL
BUN: 39 mg/dL — ABNORMAL HIGH (ref 6–23)
CO2: 27 mEq/L (ref 19–32)
Calcium: 9.2 mg/dL (ref 8.4–10.5)
Chloride: 103 mEq/L (ref 96–112)
Creatinine, Ser: 1.67 mg/dL — ABNORMAL HIGH (ref 0.40–1.50)
GFR: 41 mL/min — ABNORMAL LOW (ref >60.00)
Glucose, Bld: 238 mg/dL — ABNORMAL HIGH (ref 70–99)
Potassium: 4.7 mEq/L (ref 3.5–5.1)
Sodium: 139 mEq/L (ref 135–145)

## 2019-12-28 LAB — HEMOGLOBIN A1C: Hgb A1c MFr Bld: 11.1 % — ABNORMAL HIGH (ref 4.6–6.5)

## 2019-12-28 MED ORDER — BUDESONIDE-FORMOTEROL FUMARATE 160-4.5 MCG/ACT IN AERO: INHALATION_SPRAY | RESPIRATORY_TRACT | 1 refills | Status: DC

## 2019-12-28 NOTE — Assessment & Plan Note (Signed)
Adequate control today.  Continue current regimen.

## 2019-12-28 NOTE — Progress Notes (Signed)
Marikay Alar, MD Phone: [phone]  Danny Friends Kalan Sr. is a 69 y.o. male who presents today for f/u.  Diabetes: Has not been checking his sugars often though notes they are running 285-400 when he is checking them.  Taking Jardiance, Metformin, Evaristo Bury, and Trulicity.  Notes polyuria and polydipsia.  No hypoglycemia.  He has been to the ED on several occasions when it feels like he is in another world.  Most recently they found out that he was dehydrated and gave him some fluids and wanted him to stay for more fluids though he left.  He has been getting lightheaded when he stands up quickly.  No syncope.  He has not been drinking many fluids.  Sugars were quite elevated in the ED.  Hypertension/CAD/COPD: Not checking blood pressures.  Taking Imdur, lisinopril, Coreg, and Lipitor.  No chest pain.  He has chronic stable dyspnea on exertion.  No edema.  He wonders if he needs home oxygen given his dyspnea.  The dyspnea has been stable since he had Covid late last year.  He does note he has been out of his Symbicort.  He notes that was helpful when he was using it.  Social History   Tobacco Use  Smoking Status Former Smoker  . Packs/day: 3.00  . Years: 0.00  . Pack years: 0.00  . Types: Cigarettes  . Quit date: 09/07/2009  . Years since quitting: 10.3  Smokeless Tobacco Never Used  Tobacco Comment   quit june 2011     ROS see history of present illness  Objective  Physical Exam Vitals:   12/28/19 1020  BP: 130/80  Pulse: 91  SpO2: 94%   6-minute walk test with oxygen saturation of 93% at the lowest.  BP Readings from Last 3 Encounters:  12/28/19 130/80  12/24/19 101/67  12/22/19 109/72   Wt Readings from Last 3 Encounters:  12/28/19 238 lb (108 kg)  12/24/19 240 lb (108.9 kg)  12/22/19 240 lb (108.9 kg)    Physical Exam Constitutional:      General: He is not in acute distress.    Appearance: He is not diaphoretic.  Cardiovascular:     Rate and Rhythm: Normal  rate and regular rhythm.     Heart sounds: Normal heart sounds.  Pulmonary:     Effort: Pulmonary effort is normal.     Breath sounds: Normal breath sounds.  Musculoskeletal:     Right lower leg: No edema.     Left lower leg: No edema.  Skin:    General: Skin is warm and dry.  Neurological:     Mental Status: He is alert.      Assessment/Plan: Please see individual problem list.  Essential hypertension Adequate control today.  Continue current regimen.  COPD (chronic obstructive pulmonary disease) (HCC) Restart Symbicort.  Ambulatory oxygen testing completed today and does not meet criteria for home oxygen. Refer to pulmonology given chronic DOE.  Diabetes mellitus type 2, uncontrolled, with complications (HCC) Uncontrolled.  Check A1c.  We will either adjust his Evaristo Bury or his Trulicity.  I suspect his uncontrolled sugars are contributing to his feeling poorly.  AKI (acute kidney injury) (HCC) Suspect dehydration.  Encourage fluid intake.  We will recheck his labs today.  Discussed that if his kidney function was worse he is to go to the emergency room for fluids.   Orders Placed This Encounter  Procedures  . Basic Metabolic Panel (BMET)  . HgB A1c  . Ambulatory referral to Pulmonology  Referral Priority:   Routine    Referral Type:   Consultation    Referral Reason:   Specialty Services Required    Requested Specialty:   Pulmonary Disease    Number of Visits Requested:   1    Meds ordered this encounter  Medications  . budesonide-formoterol (SYMBICORT) 160-4.5 MCG/ACT inhaler    Sig: TAKE 2 PUFFS BY MOUTH TWICE A DAY    Dispense:  30.6 Inhaler    Refill:  1    This visit occurred during the SARS-CoV-2 public health emergency.  Safety protocols were in place, including screening questions prior to the visit, additional usage of staff PPE, and extensive cleaning of exam room while observing appropriate contact time as indicated for disinfecting solutions.     Tommi Rumps, MD [place]

## 2019-12-28 NOTE — Chronic Care Management (AMB) (Signed)
Chronic Care Management   Follow Up Note   12/28/2019 Name: Danny Tapp Haley Sr. MRN: [id number] DOB: [birth year]  Referred by: [name], MD Reason for referral : Chronic Care Management (Medication Managment)   Danny Estimable Vercher Sr. is a 69 y.o. year old male who is a primary care patient of [name], [name], MD. The CCM team was consulted for assistance with chronic disease management and care coordination needs.    Met with patient and his daughter in law, Danny [name], prior to PCP appointment.   Review of patient status, including review of consultants reports, relevant laboratory and other test results, and collaboration with appropriate care team members and the patient's provider was performed as part of comprehensive patient evaluation and provision of chronic care management services.    SDOH (Social Determinants of Health) assessments performed: No See Care Plan activities for detailed interventions related to [facility])     Outpatient Encounter Medications as of 12/28/2019  Medication Sig Note  . aspirin 81 MG tablet Take 81 mg by mouth daily.     [name] atorvastatin (LIPITOR) 80 MG tablet Take 1 tablet (80 mg total) by mouth daily.   . carvedilol (COREG) 12.5 MG tablet Take 1 tablet (12.5 mg total) by mouth 2 (two) times daily with a meal.   . clopidogrel (PLAVIX) 75 MG tablet Take 1 tablet (75 mg total) by mouth daily.   . empagliflozin (JARDIANCE) 25 MG TABS tablet Take 25 mg by mouth daily.   [name] escitalopram (LEXAPRO) 10 MG tablet TAKE 1 TABLET BY MOUTH EVERY DAY   . ezetimibe (ZETIA) 10 MG tablet Take 1 tablet (10 mg total) by mouth daily.   . furosemide (LASIX) 20 MG tablet TAKE 1 TABLET BY MOUTH EVERY OTHER DAY Please schedule office visit for further refills. Thank you!   [name] isosorbide mononitrate (IMDUR) 30 MG 24 hr tablet Take 1 tablet (30 mg total) by mouth daily.   [name] lisinopril (ZESTRIL) 2.5 MG tablet Take 1 tablet (2.5 mg total) by mouth daily.   . metFORMIN  (GLUCOPHAGE XR) 500 MG 24 hr tablet Take 1 tablet (500 mg total) by mouth 2 (two) times daily.   . potassium chloride (MICRO-K) 10 MEQ CR capsule Take 1 tablet (10 meq) by mouth once EVERY OTHER day with lasix (furosemide)   . tamsulosin (FLOMAX) 0.4 MG CAPS capsule TAKE 1 CAPSULE BY MOUTH EVERY DAY   . TRESIBA FLEXTOUCH 100 UNIT/ML FlexTouch Pen INJECT (45 UNITS TOTAL) INTO THE SKIN DAILY.   [name] ACCU-CHEK GUIDE test strip CHECK CBGS 4 TIMES A DAY   . acetaminophen (TYLENOL) 325 MG tablet Take 650 mg by mouth every 6 (six) hours as needed. 11/05/2019: Taking 1-2 tablets every 4 hours  . blood glucose meter kit and supplies KIT Dispense based on patient and insurance preference. Check CBGs 4 times daily. Dx code E11.9.   . Insulin Pen Needle (PEN NEEDLES) 32G X 6 MM MISC 1 each by Does not apply route 2 (two) times daily.   . nitroGLYCERIN (NITROSTAT) 0.4 MG SL tablet Place 1 tablet (0.4 mg total) under the tongue every 5 (five) minutes as needed for chest pain.   . TRULICITY 1.5 VV/6.1YW SOPN INJECT 1.5 MG INTO THE SKIN ONCE A WEEK   . VENTOLIN HFA 108 (90 Base) MCG/ACT inhaler Inhale 2 puffs into the lungs every 6 (six) hours as needed for wheezing or shortness of breath.   . [DISCONTINUED] SYMBICORT 160-4.5 MCG/ACT inhaler TAKE 2 PUFFS  BY MOUTH TWICE A DAY    No facility-administered encounter medications on file as of 12/28/2019.     Objective:   Goals Addressed            This Visit's Progress     Patient Stated   . PharmD "I don't know what I'm doing with my medications" (pt-stated)       CARE PLAN ENTRY (see longtitudinal plan of care for additional care plan information)  Current Barriers:  . Diabetes: uncontrolled; complicated by chronic medical conditions including CHF, CAD, CKD, most recent A1c 11.2%; s/p COVID. S/p multiple ED visits for feeling "out of this world" o Daughter in Sports coach, Danny [name], comes with him today, she previously was filling a pill box for him, but he noted that  he wanted to take over doing this; since then, he has not been taking things correctly o Reports he had been taking potassium daily instead of QOD, carvedilol only QAM, and metformin only QAM, until Danny [name] took over his pill box fills over the weekend . Most recent eGFR: 51 mL/min . Current antihyperglycemic regimen: metformin XR 500 mg BID - though has only been taking daily, Jardiance 25 mg, Trulicity 1.5 mg weekly, Tresiba 45 units QAM - though notes he sometimes forgets . Current blood glucose readings:  o Reports he has stopped checking because his readings are "always high" . Cardiovascular risk reduction: o Current hypertensive regimen: Carvedilol 12.5 mg BID- though only taking QAM; furosemide 20 mg QOD + potassium 10 mEq daily (did not realize it was supposed to be every other day), lisinopril 2.5 mg daily o Current hyperlipidemia regimen: atorvastatin 80 mg daily; ezetimibe 10 mg daily  o Current antiplatelet regimen: ASA 81 mg daily, clopidogrel 75 mg daily  . COPD: taking Symbicort 160/4.5 mcg PRN, notes he needed a refill, albuterol PRN  Pharmacist Clinical Goal(s):  [name] Over the next 90 days, patient will work with PharmD and primary care provider to address optimized medication management  Interventions: . Comprehensive medication review performed, medication list updated in electronic medical record . Inter-disciplinary care team collaboration (see longitudinal plan of care) . Provided patient with BID pill box to organize medications . Provided a written list to patient and Danny [name] for how to fill the pill box BID.  [name] Educated that dehydration likely related to hyperglycemia. Pending lab work, will determine antihyperglycemic adjustments . Collaborated w/ office staff to have Danny [name] added to [facility] [phone]). Will collaborate w/ Danny [name] to ensure appropriate medication management . Discussed strategies to remember medications, like setting alarms and keeping a check off sheet  on the calendar to remember if he has taken his injectables or not.   Patient Self Care Activities:  . Patient will check blood glucose BID, document, and provide at future appointments . Patient will focus on medication adherence by utilizing his BID pill box . Patient will take medications as prescribed  Please see past updates related to this goal by clicking on the "Past Updates" button in the selected goal          Plan:  - Follow up call in ~ 2 weeks  Catie Darnelle Maffucci, PharmD, [place], [place] Pharmacist [place] [phone]

## 2019-12-28 NOTE — Patient Instructions (Addendum)
Mr. Afzal,   It was great to see you both today!  Our biggest goal right now is making sure you take your medications as prescribed.   Morning: - Atorvastatin 80 mg (cholesterol) - Ezetimibe 10 mg (cholesterol) - Carvedilol 12.5 mg (blood pressure/heart) - Jardiance 25 mg (diabetes) - Furosemide 20 mg EVERY OTHER DAY (fluid) - Potassium 10 mEq EVERY OTHER DAY (with furosemide) - Metformin XR 500 mg (diabetes) - Isosorbide 30 mg (heart protection)  Evening: - Carvedilol 12.5 mg (blood pressure/heart) - Escitalopram 10 mg (mood) - Metformin XR 500 mg (diabetes) - Aspirin 81 mg (stroke/heart attack prevention) - Clopidogrel 75 mg (stroke/heart attack prevention) - Lisinopril 2.5 mg (kidney protection) - Tamsulosin 0.4 mg (prostate)  After we see your hemoglobin A1c, we'll figure out how to adjust your diabetes medications.   I've scheduled a phone call with you next Friday, June 4 at 2 pm. We will talk about how your blood sugars have been running.   Please call me with any medication questions.   Catie Darnelle Maffucci, PharmD [phone]  Visit Information  Goals Addressed            This Visit's Progress     Patient Stated   . PharmD "I don't know what I'm doing with my medications" (pt-stated)       CARE PLAN ENTRY (see longtitudinal plan of care for additional care plan information)  Current Barriers:  . Diabetes: uncontrolled; complicated by chronic medical conditions including CHF, CAD, CKD, most recent A1c 11.2%; s/p COVID. S/p multiple ED visits for feeling "out of this world" o Daughter in Sports coach, Wells Guiles, comes with him today, she previously was filling a pill box for him, but he noted that he wanted to take over doing this; since then, he has not been taking things correctly o Reports he had been taking potassium daily instead of QOD, carvedilol only QAM, and metformin only QAM, until Wells Guiles took over his pill box fills over the weekend . Most recent eGFR: 51  mL/min . Current antihyperglycemic regimen: metformin XR 500 mg BID - though has only been taking daily, Jardiance 25 mg, Trulicity 1.5 mg weekly, Tresiba 45 units QAM - though notes he sometimes forgets . Current blood glucose readings:  o Reports he has stopped checking because his readings are "always high" . Cardiovascular risk reduction: o Current hypertensive regimen: Carvedilol 12.5 mg BID- though only taking QAM; furosemide 20 mg QOD + potassium 10 mEq daily (did not realize it was supposed to be every other day), lisinopril 2.5 mg daily o Current hyperlipidemia regimen: atorvastatin 80 mg daily; ezetimibe 10 mg daily  o Current antiplatelet regimen: ASA 81 mg daily, clopidogrel 75 mg daily  . COPD: taking Symbicort 160/4.5 mcg PRN, notes he needed a refill, albuterol PRN  Pharmacist Clinical Goal(s):  [name] Over the next 90 days, patient will work with PharmD and primary care provider to address optimized medication management  Interventions: . Comprehensive medication review performed, medication list updated in electronic medical record . Inter-disciplinary care team collaboration (see longitudinal plan of care) . Provided patient with BID pill box to organize medications . Provided a written list to patient and Wells Guiles for how to fill the pill box BID.  [name] Educated that dehydration likely related to hyperglycemia. Pending lab work, will determine antihyperglycemic adjustments . Collaborated w/ office staff to have Wells Guiles added to [place] [phone]). Will collaborate w/ Wells Guiles to ensure appropriate medication management . Discussed strategies to remember medications, like setting alarms  and keeping a check off sheet on the calendar to remember if he has taken his injectables or not.   Patient Self Care Activities:  . Patient will check blood glucose BID, document, and provide at future appointments . Patient will focus on medication adherence by utilizing his BID pill box . Patient  will take medications as prescribed  Please see past updates related to this goal by clicking on the "Past Updates" button in the selected goal         The patient verbalized understanding of instructions provided today and agreed to receive a mailed copy of patient instruction and/or educational materials.  Plan:  - Follow up call in ~ 2 weeks  Catie Darnelle Maffucci, PharmD, [place], [place] Pharmacist [place] [phone]

## 2019-12-28 NOTE — Assessment & Plan Note (Signed)
Suspect dehydration.  Encourage fluid intake.  We will recheck his labs today.  Discussed that if his kidney function was worse he is to go to the emergency room for fluids.

## 2019-12-28 NOTE — Patient Instructions (Signed)
Nice to see you. Please restart your Symbicort.  We will get you to see a pulmonologist. We will call you with your lab results.

## 2019-12-28 NOTE — Assessment & Plan Note (Addendum)
Restart Symbicort.  Ambulatory oxygen testing completed today and does not meet criteria for home oxygen. Refer to pulmonology given chronic DOE.

## 2019-12-28 NOTE — Assessment & Plan Note (Signed)
Uncontrolled.  Check A1c.  We will either adjust his Evaristo Bury or his Trulicity.  I suspect his uncontrolled sugars are contributing to his feeling poorly.

## 2019-12-29 ENCOUNTER — Telehealth: Payer: Self-pay | Admitting: Family Medicine

## 2019-12-29 ENCOUNTER — Telehealth: Payer: Self-pay

## 2019-12-29 DIAGNOSIS — IMO0002 Reserved for concepts with insufficient information to code with codable children: Secondary | ICD-10-CM

## 2019-12-29 DIAGNOSIS — E1165 Type 2 diabetes mellitus with hyperglycemia: Secondary | ICD-10-CM

## 2019-12-29 MED ORDER — TRULICITY 3 MG/0.5ML ~~LOC~~ SOAJ
3.0000 mg | SUBCUTANEOUS | 1 refills | Status: DC
Start: 2019-12-29 — End: 2020-07-11

## 2019-12-29 MED ORDER — TRESIBA FLEXTOUCH 100 UNIT/ML ~~LOC~~ SOPN: 50.0000 [IU] | PEN_INJECTOR | Freq: Every day | SUBCUTANEOUS | 1 refills | Status: DC

## 2019-12-29 NOTE — Telephone Encounter (Signed)
I called the patient and he has a VM that has not been setup.  Griffyn Kucinski,cma

## 2019-12-29 NOTE — Telephone Encounter (Signed)
Please let the patient know that we are going to increase his Trulicity to 3 mg once weekly.  I sent this to his pharmacy.  I would also like for him to increase his [place] to 50 units once daily.  He needs to monitor his blood glucose very closely and if he starts to develop low blood sugars with this he needs to contact us.

## 2019-12-29 NOTE — Telephone Encounter (Signed)
I called and spoke with the patient and informed him that the provider is increasing his trulicity to 3 mg once weekly and the tresiba to 50 mg once daily an patient understood.  Vielka Klinedinst,cma

## 2019-12-29 NOTE — Telephone Encounter (Signed)
-----   Message from Lourena Simmonds, [facility] sent at 12/29/2019  8:45 AM EDT ----- That's exactly what I was thinking - Trulicity 3 mg weekly and Tresiba 50 units daily.  I have a call with him in ~2 weeks, I'll follow up with him and then Lurena Joiner about how it's going. Doubt there will be lows, but I will check. Catie ----- Message ----- From: [name], MD Sent: 12/29/2019   8:35 AM EDT To: Lourena Simmonds, RPH  A1c is still elevated. What do you think about increasing his trulicity to 3 mg weekly and the increasing his tresiba? Would 50 units be a good dose increase for the tresiba or slightly less as we are increasing the trulicity? Thanks.

## 2019-12-30 ENCOUNTER — Telehealth: Payer: Self-pay

## 2019-12-30 DIAGNOSIS — N179 Acute kidney failure, unspecified: Secondary | ICD-10-CM

## 2020-01-05 ENCOUNTER — Other Ambulatory Visit: Payer: Self-pay

## 2020-01-05 ENCOUNTER — Other Ambulatory Visit (INDEPENDENT_AMBULATORY_CARE_PROVIDER_SITE_OTHER): Payer: Medicare Other

## 2020-01-05 DIAGNOSIS — N179 Acute kidney failure, unspecified: Secondary | ICD-10-CM

## 2020-01-05 LAB — BASIC METABOLIC PANEL
BUN: 33 mg/dL — ABNORMAL HIGH (ref 6–23)
CO2: 28 mEq/L (ref 19–32)
Calcium: 9.1 mg/dL (ref 8.4–10.5)
Chloride: 105 mEq/L (ref 96–112)
Creatinine, Ser: 1.3 mg/dL (ref 0.40–1.50)
GFR: 54.74 mL/min — ABNORMAL LOW (ref >60.00)
Glucose, Bld: 126 mg/dL — ABNORMAL HIGH (ref 70–99)
Potassium: 3.8 mEq/L (ref 3.5–5.1)
Sodium: 138 mEq/L (ref 135–145)

## 2020-01-08 ENCOUNTER — Ambulatory Visit (INDEPENDENT_AMBULATORY_CARE_PROVIDER_SITE_OTHER): Payer: Medicare Other | Admitting: Pharmacist

## 2020-01-08 DIAGNOSIS — IMO0002 Reserved for concepts with insufficient information to code with codable children: Secondary | ICD-10-CM

## 2020-01-08 DIAGNOSIS — E1165 Type 2 diabetes mellitus with hyperglycemia: Secondary | ICD-10-CM

## 2020-01-08 DIAGNOSIS — E118 Type 2 diabetes mellitus with unspecified complications: Secondary | ICD-10-CM | POA: Diagnosis not present

## 2020-01-08 MED ORDER — TRESIBA FLEXTOUCH 100 UNIT/ML ~~LOC~~ SOPN: 40.0000 [IU] | PEN_INJECTOR | Freq: Every day | SUBCUTANEOUS | 1 refills | Status: DC

## 2020-01-08 NOTE — Chronic Care Management (AMB) (Signed)
Chronic Care Management   Follow Up Note   01/08/2020 Name: Danny Heeter Schlossberg Sr. MRN: [id number] DOB: September 08, 1950  Referred by: [name], MD Reason for referral : Chronic Care Management (Medication Management)   Danny Estimable Berndt Sr. is a 69 y.o. year old male who is a primary care patient of [name], [name], MD. The CCM team was consulted for assistance with chronic disease management and care coordination needs.    Contacted patient for medication management review.   Review of patient status, including review of consultants reports, relevant laboratory and other test results, and collaboration with appropriate care team members and the patient's provider was performed as part of comprehensive patient evaluation and provision of chronic care management services.    SDOH (Social Determinants of Health) assessments performed: No See Care Plan activities for detailed interventions related to [facility])     Outpatient Encounter Medications as of 01/08/2020  Medication Sig Note  . Dulaglutide (TRULICITY) 3 JT/7.0VX SOPN Inject 3 mg as directed once a week. 01/08/2020: Still using 1.5 mg  . insulin degludec (TRESIBA FLEXTOUCH) 100 UNIT/ML FlexTouch Pen Inject 0.5 mLs (50 Units total) into the skin daily.   . metFORMIN (GLUCOPHAGE XR) 500 MG 24 hr tablet Take 1 tablet (500 mg total) by mouth 2 (two) times daily.   [name] ACCU-CHEK GUIDE test strip CHECK CBGS 4 TIMES A DAY   . acetaminophen (TYLENOL) 325 MG tablet Take 650 mg by mouth every 6 (six) hours as needed. 11/05/2019: Taking 1-2 tablets every 4 hours  . aspirin 81 MG tablet Take 81 mg by mouth daily.     [name] atorvastatin (LIPITOR) 80 MG tablet Take 1 tablet (80 mg total) by mouth daily.   . blood glucose meter kit and supplies KIT Dispense based on patient and insurance preference. Check CBGs 4 times daily. Dx code E11.9.   . budesonide-formoterol (SYMBICORT) 160-4.5 MCG/ACT inhaler TAKE 2 PUFFS BY MOUTH TWICE A DAY   . carvedilol (COREG)  12.5 MG tablet Take 1 tablet (12.5 mg total) by mouth 2 (two) times daily with a meal.   . clopidogrel (PLAVIX) 75 MG tablet Take 1 tablet (75 mg total) by mouth daily.   . empagliflozin (JARDIANCE) 25 MG TABS tablet Take 25 mg by mouth daily.   [name] escitalopram (LEXAPRO) 10 MG tablet TAKE 1 TABLET BY MOUTH EVERY DAY   . ezetimibe (ZETIA) 10 MG tablet Take 1 tablet (10 mg total) by mouth daily.   . furosemide (LASIX) 20 MG tablet TAKE 1 TABLET BY MOUTH EVERY OTHER DAY Please schedule office visit for further refills. Thank you!   . Insulin Pen Needle (PEN NEEDLES) 32G X 6 MM MISC 1 each by Does not apply route 2 (two) times daily.   . isosorbide mononitrate (IMDUR) 30 MG 24 hr tablet Take 1 tablet (30 mg total) by mouth daily.   [name] lisinopril (ZESTRIL) 2.5 MG tablet Take 1 tablet (2.5 mg total) by mouth daily.   . nitroGLYCERIN (NITROSTAT) 0.4 MG SL tablet Place 1 tablet (0.4 mg total) under the tongue every 5 (five) minutes as needed for chest pain.   . potassium chloride (MICRO-K) 10 MEQ CR capsule Take 1 tablet (10 meq) by mouth once EVERY OTHER day with lasix (furosemide)   . tamsulosin (FLOMAX) 0.4 MG CAPS capsule TAKE 1 CAPSULE BY MOUTH EVERY DAY   . VENTOLIN HFA 108 (90 Base) MCG/ACT inhaler Inhale 2 puffs into the lungs every 6 (six) hours as needed for  wheezing or shortness of breath.    No facility-administered encounter medications on file as of 01/08/2020.     Objective:   Goals Addressed            This Visit's Progress     Patient Stated   . PharmD "I don't know what I'm doing with my medications" (pt-stated)       CARE PLAN ENTRY (see longtitudinal plan of care for additional care plan information)  Current Barriers:  . Diabetes: uncontrolled; complicated by chronic medical conditions including CHF, CAD, CKD, most recent A1c 11.1%; s/p COVID. S/p multiple ED visits for feeling "out of this world" o Confirms that since our last appointment, Wells Guiles has been filling a BID  pill box for him. o Confirms that he feels "much more normal" in the past two weeks, now that he is taking medications as prescribed. . Most recent eGFR: 54 mL/min . Current antihyperglycemic regimen: metformin XR 500 mg BID, Jardiance 25 mg, Trulicity 1.5 mg weekly- was instructed to increase to 3 mg, but patient had a supply of 1.5 mg at home so did not pick up increased dose; Tresiba 50 units . Reports symptoms of hypoglycemia when BG <100 . Current blood glucose readings:  o Fasting: 90-120s; one reading in the 400s, but he had eaten donuts o Reports that 2-3 hour post breakfast readings are similar . Cardiovascular risk reduction: o Current hypertensive regimen: Carvedilol 12.5 mg BID; furosemide 20 mg QOD + potassium 10 mEq QOD, lisinopril 2.5 mg daily o Current hyperlipidemia regimen: atorvastatin 80 mg daily; ezetimibe 10 mg daily  o Current antiplatelet regimen: ASA 81 mg daily, clopidogrel 75 mg daily  . COPD: taking Symbicort 160/4.5 mcg PRN, albuterol PRN  Pharmacist Clinical Goal(s):  [name] Over the next 90 days, patient will work with PharmD and primary care provider to address optimized medication management  Interventions: . Comprehensive medication review performed, medication list updated in electronic medical record . Inter-disciplinary care team collaboration (see longitudinal plan of care) . Reviewed glucose readings and symptomatic hypoglycemia d/t recently having been very hyperglycemic. Patient verbalizes understanding . Patient picked up 84 day supply of Trulicity 1.5 mg syringes in April. Instructed that to complete this supply, he can inject 2 syringes weekly to equal 3 mg weekly. Given low fastings, will proactively decrease Tresiba to 40 units daily to reduce risk of hypoglycemia when he increases Trulicity next week.  . Also called daughter in law, Wells Guiles. Left message noting that I would be mailing instructions to him, but that she can call me back to review as well,  if she would like.  Patient Self Care Activities:  . Patient will check blood glucose BID, document, and provide at future appointments . Patient will focus on medication adherence by utilizing his BID pill box . Patient will take medications as prescribed  Please see past updates related to this goal by clicking on the "Past Updates" button in the selected goal          Plan:  - Scheduled f/u call in ~ 6 weeks  Catie Darnelle Maffucci, PharmD, [place], [place] Pharmacist [place] [phone]

## 2020-01-08 NOTE — Patient Instructions (Addendum)
Danny [name],   It was great talking to you!  We want you to increase Trulicity to 3 mg weekly. Since you have so many 1.5 mg syringes at home, you can inject 2 of these per week to use up this supply. When you finish these, fill the prescription for the Trulicity 3 mg syringe that Dr. [name] sent to the pharmacy last month.   When you increase the Trulicity, DECREASE your Tresiba to 40 units daily. I want to keep your fasting sugars generally between 100-130s. We are not worried as long as sugars are >70, but I don't want to drop you too quickly, since your body isn't used to being this low.   Call me with any questions! I'll call you in late July to touch base.   Keep up the great work!  Danny [name], PharmD [phone]  Visit Information  Goals Addressed            This Visit's Progress     Patient Stated   . PharmD "I don't know what I'm doing with my medications" (pt-stated)       CARE PLAN ENTRY (see longtitudinal plan of care for additional care plan information)  Current Barriers:  . Diabetes: uncontrolled; complicated by chronic medical conditions including CHF, CAD, CKD, most recent A1c 11.1%; s/p COVID. S/p multiple ED visits for feeling "out of this world" o Confirms that since our last appointment, Danny [name] has been filling a BID pill box for him. o Confirms that he feels "much more normal" in the past two weeks, now that he is taking medications as prescribed. . Most recent eGFR: 54 mL/min . Current antihyperglycemic regimen: metformin XR 500 mg BID, Jardiance 25 mg, Trulicity 1.5 mg weekly- was instructed to increase to 3 mg, but patient had a supply of 1.5 mg at home so did not pick up increased dose; Tresiba 50 units . Reports symptoms of hypoglycemia when BG <100 . Current blood glucose readings:  o Fasting: 90-120s; one reading in the 400s, but he had eaten donuts o Reports that 2-3 hour post breakfast readings are similar . Cardiovascular risk  reduction: o Current hypertensive regimen: Carvedilol 12.5 mg BID; furosemide 20 mg QOD + potassium 10 mEq QOD, lisinopril 2.5 mg daily o Current hyperlipidemia regimen: atorvastatin 80 mg daily; ezetimibe 10 mg daily  o Current antiplatelet regimen: ASA 81 mg daily, clopidogrel 75 mg daily  . COPD: taking Symbicort 160/4.5 mcg PRN, albuterol PRN  Pharmacist Clinical Goal(s):  [name] Over the next 90 days, patient will work with PharmD and primary care provider to address optimized medication management  Interventions: . Comprehensive medication review performed, medication list updated in electronic medical record . Inter-disciplinary care team collaboration (see longitudinal plan of care) . Reviewed glucose readings and symptomatic hypoglycemia d/t recently having been very hyperglycemic. Patient verbalizes understanding . Patient picked up 84 day supply of Trulicity 1.5 mg syringes in April. Instructed that to complete this supply, he can inject 2 syringes weekly to equal 3 mg weekly. Given low fastings, will proactively decrease Tresiba to 40 units daily to reduce risk of hypoglycemia when he increases Trulicity next week.  . Also called daughter in law, Danny [name]. Left message noting that I would be mailing instructions to him, but that she can call me back to review as well, if she would like.  Patient Self Care Activities:  . Patient will check blood glucose BID, document, and provide at future appointments . Patient will focus on medication adherence  by utilizing his BID pill box . Patient will take medications as prescribed  Please see past updates related to this goal by clicking on the "Past Updates" button in the selected goal         The patient verbalized understanding of instructions provided today and agreed to receive a mailed copy of patient instruction and/or educational materials.  Plan:  - Scheduled f/u call in ~ 6 weeks  Danny [name], PharmD, Danny [name], Danny [name]  Pharmacist Danny [name] [phone]

## 2020-01-19 ENCOUNTER — Institutional Professional Consult (permissible substitution): Payer: Medicare Other | Admitting: Pulmonary Disease

## 2020-01-20 ENCOUNTER — Telehealth: Payer: Self-pay | Admitting: Family Medicine

## 2020-01-20 NOTE — Telephone Encounter (Signed)
Please call the patient and see if he is willing to see pulmonology for his COPD.  He missed an appointment with them recently.

## 2020-01-20 NOTE — Telephone Encounter (Signed)
I called the patient but he has a VM that has not been setup.  Raney Antwine,cma

## 2020-01-20 NOTE — Telephone Encounter (Signed)
Rejection Reason - Patient was No Show" [facility] said 1 day ago  "LVM to schedule consult BL" [facility] said 21 days ago

## 2020-01-22 NOTE — Telephone Encounter (Signed)
I called the patient and he has a VM that has not been setup.  Marylouise Mallet,cma  

## 2020-01-25 NOTE — Telephone Encounter (Signed)
I called and spoke with the patient and he stated it was his fault, he does want to see them and he has a letter they sent and he will call and schedule to see them, he stated he forgot about the appointment and missed it.  Mekisha Bittel,cma

## 2020-02-09 ENCOUNTER — Other Ambulatory Visit: Payer: Self-pay | Admitting: Cardiovascular Disease

## 2020-02-10 ENCOUNTER — Other Ambulatory Visit: Payer: Self-pay | Admitting: Cardiovascular Disease

## 2020-02-10 DIAGNOSIS — IMO0002 Reserved for concepts with insufficient information to code with codable children: Secondary | ICD-10-CM

## 2020-02-10 DIAGNOSIS — E1165 Type 2 diabetes mellitus with hyperglycemia: Secondary | ICD-10-CM

## 2020-02-26 ENCOUNTER — Other Ambulatory Visit: Payer: Self-pay | Admitting: Cardiovascular Disease

## 2020-02-29 ENCOUNTER — Other Ambulatory Visit: Payer: Self-pay | Admitting: Family Medicine

## 2020-02-29 ENCOUNTER — Ambulatory Visit (INDEPENDENT_AMBULATORY_CARE_PROVIDER_SITE_OTHER): Payer: Medicare Other | Admitting: Pharmacist

## 2020-02-29 DIAGNOSIS — E785 Hyperlipidemia, unspecified: Secondary | ICD-10-CM | POA: Diagnosis not present

## 2020-02-29 DIAGNOSIS — I10 Essential (primary) hypertension: Secondary | ICD-10-CM | POA: Diagnosis not present

## 2020-02-29 MED ORDER — CARVEDILOL 12.5 MG PO TABS: 12.5000 mg | ORAL_TABLET | Freq: Two times a day (BID) | ORAL | 3 refills | Status: DC

## 2020-02-29 MED ORDER — EZETIMIBE 10 MG PO TABS: 10.0000 mg | ORAL_TABLET | Freq: Every day | ORAL | 3 refills | Status: DC

## 2020-02-29 NOTE — Patient Instructions (Addendum)
Danny [name],   This is how I recommend you fill your pill box:   Morning: - Carvedilol 12.5 mg (heart rate, blood pressure) - Jardiance 25 mg (diabetes) - Escitalopram 10 mg (mood) - Aspirin 81 mg (clot prevention) - Clopidogrel 75 mg (clot prevention) - Isosorbide 30 mg (preventing chest pain) - Metformin XR 500 mg (diabetes)  Evening: - Carvedilol 12.5 mg (heart rate, blood pressure) - Atorvastatin 80 mg (cholesterol) - Ezetimibe 10 mg (cholesterol) - Lisinopril 2.5 mg (kidney protection) - Metformin XR 500 mg (diabetes)  You should take your Trulicity once weekly and Tresiba every morning.   Next phone call, I want [place] to go through each of your pill bottles to make sure you have everything.   Between now and then, please use the Log to write down your blood sugar and blood pressure readings. Then, you can more easily review them with me when we talk next  Take care,    Catie Darnelle Maffucci, PharmD, [place], [place] [phone]  Visit Information  Goals Addressed              This Visit's Progress     Patient Stated   .  PharmD "I don't know what I'm doing with my medications" (pt-stated)        CARE PLAN ENTRY (see longtitudinal plan of care for additional care plan information)  Current Barriers:  . Social, financial, community barriers o Patient reports that he is now filling his pill box instead of his daughter in Sports coach. Notes that he has gotten a new glasses prescription, so he can read his bottles and fill the pill box o Patient fell last Friday in the kitchen, notes his feet just got tangled under him. Hit his side, but denies any bruising. Is sore, so is in bed today.  o No medication cost concerns noted d/t Medicare + Medicaid . Diabetes: uncontrolled; complicated by chronic medical conditions including CHF, CAD, CKD, most recent A1c 11.1%; s/p COVID.  [name] Most recent eGFR: 54  mL/min . Current antihyperglycemic regimen: metformin XR 500 mg BID, Jardiance 25 mg, Trulicity 3 mg weekly, Tresiba 45 units o Per refill hx, overdue on refill for metformin XR 500 mg BID . Reports symptoms of hypoglycemia when BG <100 . Current blood glucose readings- per patient recall  o Fastings: 107-200s, reports often <130 o Later in the day, 170-180 . Cardiovascular risk reduction: o Current hypertensive regimen: carvedilol 12.5 mg BID; furosemide 20 mg QOD + potassium 10 mEq QOD, lisinopril 2.5 mg daily; has not checked at home recently,  - Overdue on refill for carvedilol, lisinopril o Current hyperlipidemia regimen: atorvastatin 80 mg daily; ezetimibe 10 mg daily - Overdue for refill on ezetimibe o Current antiplatelet regimen: ASA 81 mg daily, clopidogrel 75 mg daily  . COPD: taking Symbicort 160/4.5 mcg PRN, albuterol PRN - reports that he has only been using his "red and gray" inhaler, doesn't have it with him so he doesn't know the name. Notes he is using PRN. Per refill hx, Symbicort was filled 5/24 for 90 day supply. Patient has not needed to fill albuterol HFA since Symbicort started  Pharmacist Clinical Goal(s):  [name] Over the next 90 days, patient will work with PharmD and primary care provider to address optimized medication management  Interventions: . Comprehensive medication review performed, medication list updated in electronic medical record . Inter-disciplinary care team collaboration (see longitudinal plan of care) . Reviewed per inhaler colors  that he is taking Symbicort PRN. Reviewed to take scheduled BID until f/u with Dr. Patsey Berthold, given his etiology being COPD/s/p COVID 19, rather than intermittent asthma where PRN ICS/LABA use is appropriate.  Nash Dimmer w/ [facility] to refill metformin, carvedilol, ezetimibe, lisinopril. Refills sent where needed. Providing patient with an updated medication list to fill his pill box. Discussed possibility of moving to a  pharmacy that offers adherence packing. He said he is fine right now, but will consider moving forward.  . Discussed importance of checking glucose to evaluate therapy. Requested that he check glucose fasting and 2 hour post prandial, and document. Mailing BG/BP log, asked that patient write down his readings to allow for improved recall at future phone calls with me. He verbalized understanding.   Patient Self Care Activities:  . Patient will check blood glucose BID, document, and provide at future appointments . Patient will focus on medication adherence by utilizing his BID pill box . Patient will take medications as prescribed  Please see past updates related to this goal by clicking on the "Past Updates" button in the selected goal         The patient verbalized understanding of instructions provided today and agreed to receive a mailed copy of patient instruction and/or educational materials.  Plan:  - Scheduled f/u call in ~ 3 weeks  Catie Darnelle Maffucci, PharmD, [place], [place] Pharmacist [place] [phone]

## 2020-02-29 NOTE — Chronic Care Management (AMB) (Signed)
Chronic Care Management   Follow Up Note   02/29/2020 Name: Danny Monette Simerly Sr. MRN: [id number] DOB: 31-Jul-1951  Referred by: [name], MD Reason for referral : Chronic Care Management (Medication Management)   [name] is a 69 y.o. year old male who is a primary care patient of [name], [name], MD. The CCM team was consulted for assistance with chronic disease management and care coordination needs.    Contacted patient for medication management review.   Review of patient status, including review of consultants reports, relevant laboratory and other test results, and collaboration with appropriate care team members and the patient's provider was performed as part of comprehensive patient evaluation and provision of chronic care management services.    SDOH (Social Determinants of Health) assessments performed: No See Care Plan activities for detailed interventions related to [facility])     Outpatient Encounter Medications as of 02/29/2020  Medication Sig Note  . aspirin 81 MG tablet Take 81 mg by mouth daily.     [name] atorvastatin (LIPITOR) 80 MG tablet Take 1 tablet (80 mg total) by mouth daily.   . blood glucose meter kit and supplies KIT Dispense based on patient and insurance preference. Check CBGs 4 times daily. Dx code E11.9.   . budesonide-formoterol (SYMBICORT) 160-4.5 MCG/ACT inhaler TAKE 2 PUFFS BY MOUTH TWICE A DAY   . carvedilol (COREG) 12.5 MG tablet Take 1 tablet (12.5 mg total) by mouth 2 (two) times daily with a meal.   . clopidogrel (PLAVIX) 75 MG tablet TAKE 1 TABLET BY MOUTH EVERY DAY   . Dulaglutide (TRULICITY) 3 KC/1.2XN SOPN Inject 3 mg as directed once a week.   . empagliflozin (JARDIANCE) 25 MG TABS tablet Take 25 mg by mouth daily.   [name] escitalopram (LEXAPRO) 10 MG tablet TAKE 1 TABLET BY MOUTH EVERY DAY   . ezetimibe (ZETIA) 10 MG tablet Take 1 tablet (10 mg total) by mouth daily.   . furosemide (LASIX) 20 MG tablet TAKE 1 TABLET BY MOUTH EVERY  OTHER DAY Please schedule office visit for further refills. Thank you!   [name] insulin degludec (TRESIBA FLEXTOUCH) 100 UNIT/ML FlexTouch Pen Inject 0.4 mLs (40 Units total) into the skin daily.   . Insulin Pen Needle (PEN NEEDLES) 32G X 6 MM MISC 1 each by Does not apply route 2 (two) times daily.   . isosorbide mononitrate (IMDUR) 30 MG 24 hr tablet TAKE 1 TABLET BY MOUTH EVERY DAY   . lisinopril (ZESTRIL) 2.5 MG tablet Take 1 tablet (2.5 mg total) by mouth daily.   . metFORMIN (GLUCOPHAGE XR) 500 MG 24 hr tablet Take 1 tablet (500 mg total) by mouth 2 (two) times daily.   . potassium chloride (MICRO-K) 10 MEQ CR capsule TAKE 1 CAPSULE (10 MEQ) BY MOUTH ONCE EVERY OTHER DAY WITH LASIX (FUROSEMIDE)   . tamsulosin (FLOMAX) 0.4 MG CAPS capsule TAKE 1 CAPSULE BY MOUTH EVERY DAY   . [DISCONTINUED] carvedilol (COREG) 12.5 MG tablet Take 1 tablet (12.5 mg total) by mouth 2 (two) times daily with a meal.   . [DISCONTINUED] ezetimibe (ZETIA) 10 MG tablet Take 1 tablet (10 mg total) by mouth daily.   [name] ACCU-CHEK GUIDE test strip CHECK CBGS 4 TIMES A DAY   . acetaminophen (TYLENOL) 325 MG tablet Take 650 mg by mouth every 6 (six) hours as needed. 11/05/2019: Taking 1-2 tablets every 4 hours  . nitroGLYCERIN (NITROSTAT) 0.4 MG SL tablet Place 1 tablet (0.4 mg total) under  the tongue every 5 (five) minutes as needed for chest pain.   . VENTOLIN HFA 108 (90 Base) MCG/ACT inhaler Inhale 2 puffs into the lungs every 6 (six) hours as needed for wheezing or shortness of breath.    No facility-administered encounter medications on file as of 02/29/2020.     Objective:   Goals Addressed              This Visit's Progress     Patient Stated   .  PharmD "I don't know what I'm doing with my medications" (pt-stated)        CARE PLAN ENTRY (see longtitudinal plan of care for additional care plan information)  Current Barriers:  . Social, financial, community barriers o Patient reports that he is now filling his  pill box instead of his daughter in [employer]. Notes that he has gotten a new glasses prescription, so he can read his bottles and fill the pill box o Patient fell last Friday in the kitchen, notes his feet just got tangled under him. Hit his side, but denies any bruising. Is sore, so is in bed today.  o No medication cost concerns noted d/t Medicare + Medicaid . Diabetes: uncontrolled; complicated by chronic medical conditions including CHF, CAD, CKD, most recent A1c 11.1%; s/p COVID.  [name] Most recent eGFR: 54 mL/min . Current antihyperglycemic regimen: metformin XR 500 mg BID, Jardiance 25 mg, Trulicity 3 mg weekly, Tresiba 45 units o Per refill hx, overdue on refill for metformin XR 500 mg BID . Reports symptoms of hypoglycemia when BG <100 . Current blood glucose readings- per patient recall  o Fastings: 107-200s, reports often <130 o Later in the day, 170-180 . Cardiovascular risk reduction: o Current hypertensive regimen: carvedilol 12.5 mg BID; furosemide 20 mg QOD + potassium 10 mEq QOD, lisinopril 2.5 mg daily; has not checked at home recently,  - Overdue on refill for carvedilol, lisinopril o Current hyperlipidemia regimen: atorvastatin 80 mg daily; ezetimibe 10 mg daily - Overdue for refill on ezetimibe o Current antiplatelet regimen: ASA 81 mg daily, clopidogrel 75 mg daily  . COPD: taking Symbicort 160/4.5 mcg PRN, albuterol PRN - reports that he has only been using his "red and gray" inhaler, doesn't have it with him so he doesn't know the name. Notes he is using PRN. Per refill hx, Symbicort was filled 5/24 for 90 day supply. Patient has not needed to fill albuterol HFA since Symbicort started  Pharmacist Clinical Goal(s):  [name] Over the next 90 days, patient will work with PharmD and primary care provider to address optimized medication management  Interventions: . Comprehensive medication review performed, medication list updated in electronic medical record . Inter-disciplinary care  team collaboration (see longitudinal plan of care) . Reviewed per inhaler colors that he is taking Symbicort PRN. Reviewed to take scheduled BID until f/u with Dr. Patsey Berthold, given his etiology being COPD/s/p COVID 19, rather than intermittent asthma where PRN ICS/LABA use is appropriate.  Nash Dimmer w/ [facility] to refill metformin, carvedilol, ezetimibe, lisinopril. Refills sent where needed. Providing patient with an updated medication list to fill his pill box. Discussed possibility of moving to a pharmacy that offers adherence packing. He said he is fine right now, but will consider moving forward.  . Discussed importance of checking glucose to evaluate therapy. Requested that he check glucose fasting and 2 hour post prandial, and document. Mailing BG/BP log, asked that patient write down his readings to allow for improved recall at future  phone calls with me. He verbalized understanding.   Patient Self Care Activities:  . Patient will check blood glucose BID, document, and provide at future appointments . Patient will focus on medication adherence by utilizing his BID pill box . Patient will take medications as prescribed  Please see past updates related to this goal by clicking on the "Past Updates" button in the selected goal          Plan:  - Scheduled f/u call in ~ 3 weeks  Catie Darnelle Maffucci, PharmD, [place], [place] Pharmacist [place] [phone]

## 2020-03-15 ENCOUNTER — Institutional Professional Consult (permissible substitution): Payer: Medicare Other | Admitting: Pulmonary Disease

## 2020-03-20 ENCOUNTER — Other Ambulatory Visit: Payer: Self-pay | Admitting: Cardiovascular Disease

## 2020-03-23 ENCOUNTER — Ambulatory Visit: Payer: Medicare Other | Admitting: Pharmacist

## 2020-03-23 NOTE — Chronic Care Management (AMB) (Signed)
  Chronic Care Management   Note  03/23/2020 Name: Danny Gahan Spilman Sr. MRN: [id number] DOB: 05/05/1951  Danny Friends Moist Sr. is a 69 y.o. year old male who is a primary care patient of [name], Yehuda Mao, MD. The CCM team was consulted for assistance with chronic disease management and care coordination needs.    Contacted patient for scheduled appointment time. He notes that he is "sick" today. Nauseous, but is staying well hydrated. Has not been checking any blood sugars lately. Reviewed upcoming appointment w/ PCP; rescheduled my follow up as a face to face after PCP appointment next week.   Encouraged patient to go to Urgent Care for evaluation if nausea does not improve or worsens.   Catie Feliz Beam, PharmD, [place], CPP Clinical Pharmacist [facility] [place] [employer] [phone]

## 2020-03-24 ENCOUNTER — Other Ambulatory Visit: Payer: Self-pay | Admitting: Cardiovascular Disease

## 2020-03-24 DIAGNOSIS — E1165 Type 2 diabetes mellitus with hyperglycemia: Secondary | ICD-10-CM

## 2020-03-24 DIAGNOSIS — IMO0002 Reserved for concepts with insufficient information to code with codable children: Secondary | ICD-10-CM

## 2020-03-29 ENCOUNTER — Ambulatory Visit (INDEPENDENT_AMBULATORY_CARE_PROVIDER_SITE_OTHER): Payer: Medicare Other | Admitting: Pharmacist

## 2020-03-29 ENCOUNTER — Other Ambulatory Visit: Payer: Self-pay

## 2020-03-29 ENCOUNTER — Ambulatory Visit (INDEPENDENT_AMBULATORY_CARE_PROVIDER_SITE_OTHER): Payer: Medicare Other | Admitting: Family Medicine

## 2020-03-29 ENCOUNTER — Encounter: Payer: Self-pay | Admitting: Family Medicine

## 2020-03-29 DIAGNOSIS — I70213 Atherosclerosis of native arteries of extremities with intermittent claudication, bilateral legs: Secondary | ICD-10-CM

## 2020-03-29 DIAGNOSIS — J432 Centrilobular emphysema: Secondary | ICD-10-CM | POA: Diagnosis not present

## 2020-03-29 DIAGNOSIS — E1165 Type 2 diabetes mellitus with hyperglycemia: Secondary | ICD-10-CM

## 2020-03-29 DIAGNOSIS — IMO0002 Reserved for concepts with insufficient information to code with codable children: Secondary | ICD-10-CM

## 2020-03-29 DIAGNOSIS — B351 Tinea unguium: Secondary | ICD-10-CM

## 2020-03-29 DIAGNOSIS — E118 Type 2 diabetes mellitus with unspecified complications: Secondary | ICD-10-CM

## 2020-03-29 DIAGNOSIS — W19XXXA Unspecified fall, initial encounter: Secondary | ICD-10-CM

## 2020-03-29 DIAGNOSIS — E1142 Type 2 diabetes mellitus with diabetic polyneuropathy: Secondary | ICD-10-CM

## 2020-03-29 DIAGNOSIS — E7800 Pure hypercholesterolemia, unspecified: Secondary | ICD-10-CM

## 2020-03-29 LAB — BASIC METABOLIC PANEL
BUN: 23 mg/dL (ref 6–23)
CO2: 28 mEq/L (ref 19–32)
Calcium: 9.5 mg/dL (ref 8.4–10.5)
Chloride: 103 mEq/L (ref 96–112)
Creatinine, Ser: 1.34 mg/dL (ref 0.40–1.50)
GFR: 52.83 mL/min — ABNORMAL LOW (ref >60.00)
Glucose, Bld: 82 mg/dL (ref 70–99)
Potassium: 4.4 mEq/L (ref 3.5–5.1)
Sodium: 139 mEq/L (ref 135–145)

## 2020-03-29 LAB — LIPID PANEL
Cholesterol: 86 mg/dL (ref 0–200)
HDL: 46.3 mg/dL (ref >39.00)
LDL Cholesterol: 29 mg/dL (ref 0–99)
NonHDL: 39.35
Total CHOL/HDL Ratio: 2
Triglycerides: 54 mg/dL (ref 0.0–149.0)
VLDL: 10.8 mg/dL (ref 0.0–40.0)

## 2020-03-29 LAB — HEMOGLOBIN A1C: Hgb A1c MFr Bld: 9.2 % — ABNORMAL HIGH (ref 4.6–6.5)

## 2020-03-29 MED ORDER — LISINOPRIL 2.5 MG PO TABS: 2.5000 mg | ORAL_TABLET | Freq: Every day | ORAL | 3 refills | Status: DC

## 2020-03-29 NOTE — Assessment & Plan Note (Signed)
Previously uncontrolled.  Undetermined control at this time given lack of home CBG readings.  We will check an A1c.  He will continue his current regimen.  I encouraged him to see the eye doctor.

## 2020-03-29 NOTE — Patient Instructions (Signed)
Nice to see you. We will get lab work today and contact you with the results. If you change your mind regarding the colonoscopy please let us know. I would encourage you to get the COVID-19 vaccine. I referred you to a podiatrist for your feet.

## 2020-03-29 NOTE — Assessment & Plan Note (Signed)
Refer to podiatry

## 2020-03-29 NOTE — Assessment & Plan Note (Signed)
Well-controlled.  Currently asymptomatic.  We will continue Symbicort.

## 2020-03-29 NOTE — Patient Instructions (Addendum)
Danny [name],   It was great seeing you today!  INCREASE metformin. Start with 1 tablet in the morning, 2 tablets in the evening for about a week. If you don't have any stomach upset, increase to 2 tablets in the morning, 2 tablets in the evening.   CHECK YOUR BLOOD SUGARS. I need you to check 1) fasting, first thing in the morning and 2) about 2 hours after the biggest meal of your day. Write down these readings in the enclosed page.   I'll talk to you in September!  Catie Darnelle Maffucci, PharmD [phone]  Visit Information  Goals Addressed              This Visit's Progress     Patient Stated   .  PharmD "I don't know what I'm doing with my medications" (pt-stated)        CARE PLAN ENTRY (see longtitudinal plan of care for additional care plan information)  Current Barriers:  . Social, financial, community barriers o Reports being stressed recently. His daughter moved to [place] and he misses his grandchildren that he had practically raised.  o Brings pill box today for review o Reports that he feels the Trulicity 3 mg dose was not as helpful as Trulicity 1.5 mg dose. Reports sugars have been less controlled since increasing, however, he cannot verbalize how long he has been on Trulicity 3 mg.  . Diabetes: uncontrolled; complicated by chronic medical conditions including CHF, CAD, CKD, most recent A1c 9.2%; s/p COVID.  [name] Most recent eGFR: 54 mL/min . Current antihyperglycemic regimen: metformin XR 500 mg BID, Jardiance 25 mg, Trulicity 3 mg weekly, Tresiba 45 units o No hx higher doses of metformin  . Current blood glucose readings cannot remember, did not bring readings with him today. . Cardiovascular risk reduction: o Current hypertensive regimen: carvedilol 12.5 mg BID; furosemide 20 mg QOD + potassium 10 mEq QOD, lisinopril 2.5 mg daily; needs refill on lisinopril.  o Current hyperlipidemia regimen: atorvastatin 80 mg daily; ezetimibe 10 mg daily o Current antiplatelet  regimen: ASA 81 mg daily, clopidogrel 75 mg daily  . COPD: taking Symbicort 160/4.5 mcg PRN, albuterol PRN   Pharmacist Clinical Goal(s):  [name] Over the next 90 days, patient will work with PharmD and primary care provider to address optimized medication management  Interventions: . Comprehensive medication review performed, medication list updated in electronic medical record . Inter-disciplinary care team collaboration (see longitudinal plan of care) . Provided empathetic listening as patient discussed his familial concerns.  . Reviewed pill box and bottles that patient brought. Patient is filling his pill box appropriately, utilizing a prior medication list. Congratulated patient for doing this . Reviewed Trulicity injection technique. No deficits noted. Reviewed Tyler Aas injection technique. No deficits noted.  . Congratulated on improvement in A1c. Discussed increasing metformin vs increasing Trulicity vs increasing [place]. Will increase metformin at this time. Patient will start with 500 mg QAM, 1000 mg QPM x 1 week, then further increase to 1000 mg BID  Patient Self Care Activities:  . Patient will check blood glucose BID, document, and provide at future appointments . Patient will focus on medication adherence by utilizing his BID pill box . Patient will take medications as prescribed  Please see past updates related to this goal by clicking on the "Past Updates" button in the selected goal         The patient verbalized understanding of instructions provided today and agreed to receive a mailed copy of patient instruction  and/or Scientist, clinical (histocompatibility and immunogenetics).  Plan:  - Scheduled f/u call in ~ 5 weeks  Catie Darnelle Maffucci, PharmD, [place], [place] Pharmacist [place] [phone]

## 2020-03-29 NOTE — Assessment & Plan Note (Signed)
Continues to have numbness in his feet.  He additionally has onychomycosis based on exam.  We are going to refer him to podiatry for that.

## 2020-03-29 NOTE — Chronic Care Management (AMB) (Signed)
Chronic Care Management   Follow Up Note   03/29/2020 Name: Mcgregor Tinnon Lamboy Sr. MRN: [id number] DOB: 1950/08/21  Referred by: [name], MD Reason for referral : Chronic Care Management (Medication Management)   Cristopher Estimable Waldrip Sr. is a 69 y.o. year old male who is a primary care patient of [name], [name], MD. The CCM team was consulted for assistance with chronic disease management and care coordination needs.    Met with patient for med review at PCP visit.   Review of patient status, including review of consultants reports, relevant laboratory and other test results, and collaboration with appropriate care team members and the patient's provider was performed as part of comprehensive patient evaluation and provision of chronic care management services.    SDOH (Social Determinants of Health) assessments performed: Yes See Care Plan activities for detailed interventions related to SDOH)  SDOH Interventions     Most Recent Value  SDOH Interventions  Financial Strain Interventions Intervention Not Indicated       Outpatient Encounter Medications as of 03/29/2020  Medication Sig Note  . aspirin 81 MG tablet Take 81 mg by mouth daily.     [name] atorvastatin (LIPITOR) 80 MG tablet Take 1 tablet (80 mg total) by mouth daily.   . carvedilol (COREG) 12.5 MG tablet TAKE 1 TABLET (12.5 MG TOTAL) BY MOUTH 2 (TWO) TIMES DAILY WITH A MEAL.   [name] clopidogrel (PLAVIX) 75 MG tablet TAKE 1 TABLET BY MOUTH EVERY DAY   . Dulaglutide (TRULICITY) 3 PZ/0.2HE SOPN Inject 3 mg as directed once a week.   . empagliflozin (JARDIANCE) 25 MG TABS tablet Take 25 mg by mouth daily.   [name] escitalopram (LEXAPRO) 10 MG tablet TAKE 1 TABLET BY MOUTH EVERY DAY   . ezetimibe (ZETIA) 10 MG tablet TAKE 1 TABLET BY MOUTH EVERY DAY   . furosemide (LASIX) 20 MG tablet TAKE 1 TABLET (20 MG) BY MOUTH ONCE EVERY OTHER DAY   . insulin degludec (TRESIBA FLEXTOUCH) 100 UNIT/ML FlexTouch Pen Inject 0.4 mLs (40 Units total)  into the skin daily. 03/29/2020: 45 units  . Insulin Pen Needle (PEN NEEDLES) 32G X 6 MM MISC 1 each by Does not apply route 2 (two) times daily.   . isosorbide mononitrate (IMDUR) 30 MG 24 hr tablet TAKE 1 TABLET BY MOUTH EVERY DAY   . metFORMIN (GLUCOPHAGE XR) 500 MG 24 hr tablet Take 1 tablet (500 mg total) by mouth 2 (two) times daily.   . potassium chloride (MICRO-K) 10 MEQ CR capsule TAKE 1 CAPSULE (10 MEQ) BY MOUTH ONCE EVERY OTHER DAY WITH LASIX (FUROSEMIDE)   . tamsulosin (FLOMAX) 0.4 MG CAPS capsule TAKE 1 CAPSULE BY MOUTH EVERY DAY   . ACCU-CHEK GUIDE test strip CHECK CBGS 4 TIMES A DAY   . acetaminophen (TYLENOL) 325 MG tablet Take 650 mg by mouth every 6 (six) hours as needed. 11/05/2019: Taking 1-2 tablets every 4 hours  . blood glucose meter kit and supplies KIT Dispense based on patient and insurance preference. Check CBGs 4 times daily. Dx code E11.9.   . budesonide-formoterol (SYMBICORT) 160-4.5 MCG/ACT inhaler TAKE 2 PUFFS BY MOUTH TWICE A DAY   . lisinopril (ZESTRIL) 2.5 MG tablet Take 1 tablet (2.5 mg total) by mouth daily.   . nitroGLYCERIN (NITROSTAT) 0.4 MG SL tablet Place 1 tablet (0.4 mg total) under the tongue every 5 (five) minutes as needed for chest pain.   . VENTOLIN HFA 108 (90 Base) MCG/ACT inhaler Inhale 2  puffs into the lungs every 6 (six) hours as needed for wheezing or shortness of breath.   . [DISCONTINUED] lisinopril (ZESTRIL) 2.5 MG tablet Take 1 tablet (2.5 mg total) by mouth daily.    No facility-administered encounter medications on file as of 03/29/2020.     Objective:   Goals Addressed              This Visit's Progress     Patient Stated   .  PharmD "I don't know what I'm doing with my medications" (pt-stated)        CARE PLAN ENTRY (see longtitudinal plan of care for additional care plan information)  Current Barriers:  . Social, financial, community barriers o Reports being stressed recently. His daughter moved to [place] and he misses  his grandchildren that he had practically raised.  o Brings pill box today for review o Reports that he feels the Trulicity 3 mg dose was not as helpful as Trulicity 1.5 mg dose. Reports sugars have been less controlled since increasing, however, he cannot verbalize how long he has been on Trulicity 3 mg.  . Diabetes: uncontrolled; complicated by chronic medical conditions including CHF, CAD, CKD, most recent A1c 9.2%; s/p COVID.  [name] Most recent eGFR: 54 mL/min . Current antihyperglycemic regimen: metformin XR 500 mg BID, Jardiance 25 mg, Trulicity 3 mg weekly, Tresiba 45 units o No hx higher doses of metformin  . Current blood glucose readings cannot remember, did not bring readings with him today. . Cardiovascular risk reduction: o Current hypertensive regimen: carvedilol 12.5 mg BID; furosemide 20 mg QOD + potassium 10 mEq QOD, lisinopril 2.5 mg daily; needs refill on lisinopril.  o Current hyperlipidemia regimen: atorvastatin 80 mg daily; ezetimibe 10 mg daily o Current antiplatelet regimen: ASA 81 mg daily, clopidogrel 75 mg daily  . COPD: taking Symbicort 160/4.5 mcg PRN, albuterol PRN   Pharmacist Clinical Goal(s):  [name] Over the next 90 days, patient will work with PharmD and primary care provider to address optimized medication management  Interventions: . Comprehensive medication review performed, medication list updated in electronic medical record . Inter-disciplinary care team collaboration (see longitudinal plan of care) . Provided empathetic listening as patient discussed his familial concerns.  . Reviewed pill box and bottles that patient brought. Patient is filling his pill box appropriately, utilizing a prior medication list. Congratulated patient for doing this . Reviewed Trulicity injection technique. No deficits noted. Reviewed Tyler Aas injection technique. No deficits noted.  . Congratulated on improvement in A1c. Discussed increasing metformin vs increasing Trulicity vs  increasing [place]. Will increase metformin at this time. Patient will start with 500 mg QAM, 1000 mg QPM x 1 week, then further increase to 1000 mg BID  Patient Self Care Activities:  . Patient will check blood glucose BID, document, and provide at future appointments . Patient will focus on medication adherence by utilizing his BID pill box . Patient will take medications as prescribed  Please see past updates related to this goal by clicking on the "Past Updates" button in the selected goal          Plan:  - Scheduled f/u call in ~ 5 weeks  Catie Darnelle Maffucci, PharmD, [place], [place] Pharmacist [place] [phone]

## 2020-03-29 NOTE — Progress Notes (Signed)
Danny Alar, MD Phone: [phone]  Danny Friends Darley Sr. is a 69 y.o. male who presents today for f/u.  DIABETES Disease Monitoring: Blood Sugar ranges-not checking Polyuria/phagia/dipsia- no      Optho- due Medications: Compliance- taking trulicity, jardiance, tresiba, metformin Hypoglycemic symptoms- no  COPD: Medication compliance- using symbicort  Rescue inhaler use- rarely Dyspnea- no  Wheezing- no  Cough- no    Fall: Patient notes 3 days ago he was putting his shoes on and got off balance and fell into the wall with his right elbow.  He had an abrasion there.  No head injury.  No syncope.  Notes the area is improving and he is using a bandage on it.  He has washed the area with soap and water.     Social History   Tobacco Use  Smoking Status Former Smoker  . Packs/day: 3.00  . Years: 0.00  . Pack years: 0.00  . Types: Cigarettes  . Quit date: 09/07/2009  . Years since quitting: 10.5  Smokeless Tobacco Never Used  Tobacco Comment   quit june 2011     ROS see history of present illness  Objective  Physical Exam Vitals:   03/29/20 1041 03/29/20 1054  BP: 118/84 110/80  Pulse: 90   Temp: 98.7 F (37.1 C)   SpO2: 92%     BP Readings from Last 3 Encounters:  03/29/20 110/80  12/28/19 130/80  12/24/19 101/67   Wt Readings from Last 3 Encounters:  03/29/20 238 lb 3.2 oz (108 kg)  12/28/19 238 lb (108 kg)  12/24/19 240 lb (108.9 kg)    Physical Exam Constitutional:      General: He is not in acute distress.    Appearance: He is not diaphoretic.  Cardiovascular:     Rate and Rhythm: Normal rate and regular rhythm.     Heart sounds: Normal heart sounds.  Pulmonary:     Effort: Pulmonary effort is normal.     Breath sounds: Normal breath sounds.  Skin:    General: Skin is warm and dry.       Neurological:     Mental Status: He is alert.    Diabetic Foot Exam - Simple   Simple Foot Form Diabetic Foot exam was performed with the following  findings: Yes 03/29/2020 10:49 AM  Visual Inspection See comments: Yes Sensation Testing See comments: Yes Pulse Check Posterior Tibialis and Dorsalis pulse intact bilaterally: Yes Comments Onychomycosis on all his toes, no other deformities, ulcerations, or skin breakdown, light touch sensation and monofilament sensation are decreased in all areas bilaterally      Assessment/Plan: Please see individual problem list.  COPD (chronic obstructive pulmonary disease) (HCC) Well-controlled.  Currently asymptomatic.  We will continue Symbicort.  Diabetes mellitus type 2, uncontrolled, with complications (HCC) Previously uncontrolled.  Undetermined control at this time given lack of home CBG readings.  We will check an A1c.  He will continue his current regimen.  I encouraged him to see the eye doctor.  Diabetic neuropathy (HCC) Continues to have numbness in his feet.  He additionally has onychomycosis based on exam.  We are going to refer him to podiatry for that.  Onychomycosis Refer to podiatry.  Atherosclerosis of native arteries of extremity with intermittent claudication (HCC) Pedal pulses were palpable today.  Continue risk factor management.  Fall Encouraged monitoring his balance.  He will monitor the abrasion and if he develops signs of infection he will let us know.   Health Maintenance: Patient  declines colonoscopy.  He understands the risk of missing a clinically significant lesion or cancer.  Orders Placed This Encounter  Procedures  . HgB A1c  . Basic metabolic panel  . Lipid panel    No orders of the defined types were placed in this encounter.   This visit occurred during the SARS-CoV-2 public health emergency.  Safety protocols were in place, including screening questions prior to the visit, additional usage of staff PPE, and extensive cleaning of exam room while observing appropriate contact time as indicated for disinfecting solutions.    Danny Alar,  MD [facility]

## 2020-03-29 NOTE — Assessment & Plan Note (Signed)
Encouraged monitoring his balance.  He will monitor the abrasion and if he develops signs of infection he will let us know.

## 2020-03-29 NOTE — Assessment & Plan Note (Signed)
Pedal pulses were palpable today.  Continue risk factor management.

## 2020-04-11 ENCOUNTER — Other Ambulatory Visit: Payer: Self-pay | Admitting: Internal Medicine

## 2020-05-04 ENCOUNTER — Telehealth: Payer: Medicare Other

## 2020-05-04 ENCOUNTER — Ambulatory Visit (INDEPENDENT_AMBULATORY_CARE_PROVIDER_SITE_OTHER): Payer: Medicare Other | Admitting: Pharmacist

## 2020-05-04 DIAGNOSIS — IMO0002 Reserved for concepts with insufficient information to code with codable children: Secondary | ICD-10-CM

## 2020-05-04 DIAGNOSIS — E1165 Type 2 diabetes mellitus with hyperglycemia: Secondary | ICD-10-CM

## 2020-05-04 DIAGNOSIS — E118 Type 2 diabetes mellitus with unspecified complications: Secondary | ICD-10-CM | POA: Diagnosis not present

## 2020-05-04 DIAGNOSIS — N183 Chronic kidney disease, stage 3 unspecified: Secondary | ICD-10-CM

## 2020-05-04 DIAGNOSIS — I10 Essential (primary) hypertension: Secondary | ICD-10-CM

## 2020-05-04 NOTE — Patient Instructions (Signed)
Danny [name],   It was great talking to you today!  PLEASE write down your blood sugar readings when you check them. Have those ready for our phone calls and bring those to Dr. Sonnenberg's appointments.   Call the customer service number on the back of your insurance card to ask about your Over the Counter Benefits and how you can use them to get a blood pressure machine.   Call me with any questions!  Catie , PharmD [phone]  Visit Information  Goals Addressed              This Visit's Progress     Patient Stated   .  PharmD "I don't know what I'm doing with my medications" (pt-stated)        CARE PLAN ENTRY (see longtitudinal plan of care for additional care plan information)  Current Barriers:  . Social, financial, community barriers o Notes he had someone come out to his house for a eye exam this morning o Notes a fall last night, denies hitting his head, denies bruising. Notes that episodes of dizziness precede these. Has never checked BP or BG while having an episode  . Diabetes: uncontrolled; complicated by chronic medical conditions including CHF, CAD, CKD, most recent A1c 9.2%; s/p COVID.  . Most recent eGFR: 54 mL/min . Current antihyperglycemic regimen: metformin XR 500 mg BID- notes he tried increasing to 1000 mg BID, but had diarrhea, so reduced to 500 mg BID, Jardiance 25 mg, Trulicity 3 mg weekly, Tresiba 45 units o Metformin XR - higher doses cause stomach upset . Current blood glucose readings:  o Fasting: 293 - says this is higher than normal, but cannot remember any other readings.   . Cardiovascular risk reduction: o Current hypertensive regimen: carvedilol 12.5 mg BID; furosemide 20 mg QOD + potassium 10 mEq QOD, lisinopril 2.5 mg daily - reports that he thinks the potassium is causing him to fall. Though, notes he self-discontinued potassium and had a fall into the wall last night. Notes that he does NOT have a functioning home BP  machine o Current hyperlipidemia regimen: atorvastatin 80 mg daily; ezetimibe 10 mg daily o Current antiplatelet regimen: ASA 81 mg daily, clopidogrel 75 mg daily  . COPD: taking Symbicort 160/4.5 mcg PRN, albuterol PRN   Pharmacist Clinical Goal(s):  . Over the next 90 days, patient will work with PharmD and primary care provider to address optimized medication management  Interventions: . Comprehensive medication review performed, medication list updated in electronic medical record . Inter-disciplinary care team collaboration (see longitudinal plan of care) . Reviewed our goal of patient checking and documenting BG readings to allow for more effective medication review. Patient verbalized understanding . Reviewed that hypoglycemia or hypotension can result in dizziness. Encouraged to check BG and/or BP when having episodes of dizziness and record for future discussion.  . Reviewed that patient likely has OTC benefits. Encouraged to contact [employer] to discuss these and see about getting a BP cuff. He verbalizes understanding.   Patient Self Care Activities:  . Patient will check blood glucose BID, document, and provide at future appointments . Patient will focus on medication adherence by utilizing his BID pill box . Patient will take medications as prescribed  Please see past updates related to this goal by clicking on the "Past Updates" button in the selected goal         The patient verbalized understanding of instructions provided today and agreed to receive a mailed copy of patient   instruction and/or educational materials.  Plan:  - Scheduled f/u call in ~ 5 weeks  Catie , PharmD, BCACP, CPP Clinical Pharmacist [facility] [place] [facility] [phone] 

## 2020-05-04 NOTE — Chronic Care Management (AMB) (Signed)
Chronic Care Management   Follow Up Note   05/04/2020 Name: Danny [name]. MRN: [id number] DOB: 1951-06-29  Referred by: [name], MD Reason for referral : Chronic Care Management (Medication Management)   Danny [name]. is a 69 y.o. year old male who is a primary care patient of [name], [name], MD. The CCM team was consulted for assistance with chronic disease management and care coordination needs.    Contacted patient for medication management review.   Review of patient status, including review of consultants reports, relevant laboratory and other test results, and collaboration with appropriate care team members and the patient's provider was performed as part of comprehensive patient evaluation and provision of chronic care management services.    SDOH (Social Determinants of Health) assessments performed: Yes See Care Plan activities for detailed interventions related to [facility])     Outpatient Encounter Medications as of 05/04/2020  Medication Sig Note  . empagliflozin (JARDIANCE) 25 MG TABS tablet Take 25 mg by mouth daily.   . insulin degludec (TRESIBA FLEXTOUCH) 100 UNIT/ML FlexTouch Pen Inject 0.4 mLs (40 Units total) into the skin daily. 03/29/2020: 45 units  . isosorbide mononitrate (IMDUR) 30 MG 24 hr tablet TAKE 1 TABLET BY MOUTH EVERY DAY   . lisinopril (ZESTRIL) 2.5 MG tablet Take 1 tablet (2.5 mg total) by mouth daily.   . metFORMIN (GLUCOPHAGE XR) 500 MG 24 hr tablet Take 1 tablet (500 mg total) by mouth 2 (two) times daily.   [name] ACCU-CHEK GUIDE test strip CHECK CBGS 4 TIMES A DAY   . acetaminophen (TYLENOL) 325 MG tablet Take 650 mg by mouth every 6 (six) hours as needed. 11/05/2019: Taking 1-2 tablets every 4 hours  . aspirin 81 MG tablet Take 81 mg by mouth daily.     [name] atorvastatin (LIPITOR) 80 MG tablet Take 1 tablet (80 mg total) by mouth daily.   . blood glucose meter kit and supplies KIT Dispense based on patient and insurance preference.  Check CBGs 4 times daily. Dx code E11.9.   . budesonide-formoterol (SYMBICORT) 160-4.5 MCG/ACT inhaler TAKE 2 PUFFS BY MOUTH TWICE A DAY   . carvedilol (COREG) 12.5 MG tablet TAKE 1 TABLET (12.5 MG TOTAL) BY MOUTH 2 (TWO) TIMES DAILY WITH A MEAL.   [name] clopidogrel (PLAVIX) 75 MG tablet TAKE 1 TABLET BY MOUTH EVERY DAY   . Dulaglutide (TRULICITY) 3 FB/5.1WC SOPN Inject 3 mg as directed once a week.   . escitalopram (LEXAPRO) 10 MG tablet TAKE 1 TABLET BY MOUTH EVERY DAY   . ezetimibe (ZETIA) 10 MG tablet TAKE 1 TABLET BY MOUTH EVERY DAY   . furosemide (LASIX) 20 MG tablet TAKE 1 TABLET (20 MG) BY MOUTH ONCE EVERY OTHER DAY   . Insulin Pen Needle (PEN NEEDLES) 32G X 6 MM MISC 1 each by Does not apply route 2 (two) times daily.   . nitroGLYCERIN (NITROSTAT) 0.4 MG SL tablet Place 1 tablet (0.4 mg total) under the tongue every 5 (five) minutes as needed for chest pain.   . potassium chloride (MICRO-K) 10 MEQ CR capsule TAKE 1 CAPSULE (10 MEQ) BY MOUTH ONCE EVERY OTHER DAY WITH LASIX (FUROSEMIDE) (Patient not taking: Reported on 05/04/2020)   . tamsulosin (FLOMAX) 0.4 MG CAPS capsule TAKE 1 CAPSULE BY MOUTH EVERY DAY   . VENTOLIN HFA 108 (90 Base) MCG/ACT inhaler Inhale 2 puffs into the lungs every 6 (six) hours as needed for wheezing or shortness of breath.  No facility-administered encounter medications on file as of 05/04/2020.     Objective:   Goals Addressed              This Visit's Progress     Patient Stated   .  PharmD "I don't know what I'm doing with my medications" (pt-stated)        CARE PLAN ENTRY (see longtitudinal plan of care for additional care plan information)  Current Barriers:  . Social, financial, community barriers o Notes he had someone come out to his house for a eye exam this morning o Notes a fall last night, denies hitting his head, denies bruising. Notes that episodes of dizziness precede these. Has never checked BP or BG while having an episode  . Diabetes:  uncontrolled; complicated by chronic medical conditions including CHF, CAD, CKD, most recent A1c 9.2%; s/p COVID.  . Most recent eGFR: 54 mL/min . Current antihyperglycemic regimen: metformin XR 500 mg BID- notes he tried increasing to 1000 mg BID, but had diarrhea, so reduced to 500 mg BID, Jardiance 25 mg, Trulicity 3 mg weekly, Tresiba 45 units o Metformin XR - higher doses cause stomach upset . Current blood glucose readings:  o Fasting: 293 - says this is higher than normal, but cannot remember any other readings.   . Cardiovascular risk reduction: o Current hypertensive regimen: carvedilol 12.5 mg BID; furosemide 20 mg QOD + potassium 10 mEq QOD, lisinopril 2.5 mg daily - reports that he thinks the potassium is causing him to fall. Though, notes he self-discontinued potassium and had a fall into the wall last night. Notes that he does NOT have a functioning home BP machine o Current hyperlipidemia regimen: atorvastatin 80 mg daily; ezetimibe 10 mg daily o Current antiplatelet regimen: ASA 81 mg daily, clopidogrel 75 mg daily  . COPD: taking Symbicort 160/4.5 mcg PRN, albuterol PRN   Pharmacist Clinical Goal(s):  . Over the next 90 days, patient will work with PharmD and primary care provider to address optimized medication management  Interventions: . Comprehensive medication review performed, medication list updated in electronic medical record . Inter-disciplinary care team collaboration (see longitudinal plan of care) . Reviewed our goal of patient checking and documenting BG readings to allow for more effective medication review. Patient verbalized understanding . Reviewed that hypoglycemia or hypotension can result in dizziness. Encouraged to check BG and/or BP when having episodes of dizziness and record for future discussion.  . Reviewed that patient likely has OTC benefits. Encouraged to contact [employer] to discuss these and see about getting a BP cuff. He verbalizes understanding.    Patient Self Care Activities:  . Patient will check blood glucose BID, document, and provide at future appointments . Patient will focus on medication adherence by utilizing his BID pill box . Patient will take medications as prescribed  Please see past updates related to this goal by clicking on the "Past Updates" button in the selected goal          Plan:  - Scheduled f/u call in ~ 5 weeks  Catie , PharmD, BCACP, CPP Clinical Pharmacist [facility] [place] [facility] [phone]  

## 2020-05-06 NOTE — Telephone Encounter (Signed)
Patient has had lab and been seen by provider.   Derec Mozingo,cma

## 2020-05-06 NOTE — Telephone Encounter (Signed)
Patient could not be reached and has since been seen by provider.  Amayiah Gosnell,cma

## 2020-05-25 ENCOUNTER — Other Ambulatory Visit: Payer: Self-pay | Admitting: Internal Medicine

## 2020-06-06 ENCOUNTER — Ambulatory Visit: Payer: Medicare Other | Admitting: Pharmacist

## 2020-06-06 DIAGNOSIS — I10 Essential (primary) hypertension: Secondary | ICD-10-CM

## 2020-06-06 DIAGNOSIS — IMO0002 Reserved for concepts with insufficient information to code with codable children: Secondary | ICD-10-CM

## 2020-06-06 DIAGNOSIS — I70213 Atherosclerosis of native arteries of extremities with intermittent claudication, bilateral legs: Secondary | ICD-10-CM

## 2020-06-06 DIAGNOSIS — E1165 Type 2 diabetes mellitus with hyperglycemia: Secondary | ICD-10-CM

## 2020-06-06 DIAGNOSIS — N183 Chronic kidney disease, stage 3 unspecified: Secondary | ICD-10-CM

## 2020-06-06 DIAGNOSIS — E1142 Type 2 diabetes mellitus with diabetic polyneuropathy: Secondary | ICD-10-CM

## 2020-06-06 NOTE — Chronic Care Management (AMB) (Signed)
Chronic Care Management   Follow Up Note   06/06/2020 Name: Danny Corkery Hallisey Sr. MRN: [id number] DOB: 1951/06/06  Referred by: [name], MD Reason for referral : Chronic Care Management (Medication Management)   Danny Estimable Giller Sr. is a 69 y.o. year old male who is a primary care patient of [name], [name], MD. The CCM team was consulted for assistance with chronic disease management and care coordination needs.    Contacted patient for medication management follow up.  Review of patient status, including review of consultants reports, relevant laboratory and other test results, and collaboration with appropriate care team members and the patient's provider was performed as part of comprehensive patient evaluation and provision of chronic care management services.    SDOH (Social Determinants of Health) assessments performed: No See Care Plan activities for detailed interventions related to [facility])     Outpatient Encounter Medications as of 06/06/2020  Medication Sig Note  . ACCU-CHEK GUIDE test strip CHECK CBGS 4 TIMES A DAY   . acetaminophen (TYLENOL) 325 MG tablet Take 650 mg by mouth every 6 (six) hours as needed. 11/05/2019: Taking 1-2 tablets every 4 hours  . aspirin 81 MG tablet Take 81 mg by mouth daily.     [name] atorvastatin (LIPITOR) 80 MG tablet Take 1 tablet (80 mg total) by mouth daily.   . blood glucose meter kit and supplies KIT Dispense based on patient and insurance preference. Check CBGs 4 times daily. Dx code E11.9.   . budesonide-formoterol (SYMBICORT) 160-4.5 MCG/ACT inhaler TAKE 2 PUFFS BY MOUTH TWICE A DAY   . carvedilol (COREG) 12.5 MG tablet TAKE 1 TABLET (12.5 MG TOTAL) BY MOUTH 2 (TWO) TIMES DAILY WITH A MEAL.   [name] clopidogrel (PLAVIX) 75 MG tablet TAKE 1 TABLET BY MOUTH EVERY DAY   . Dulaglutide (TRULICITY) 3 HY/8.6VH SOPN Inject 3 mg as directed once a week.   . empagliflozin (JARDIANCE) 25 MG TABS tablet Take 25 mg by mouth daily.   [name] escitalopram  (LEXAPRO) 10 MG tablet TAKE 1 TABLET BY MOUTH EVERY DAY   . ezetimibe (ZETIA) 10 MG tablet TAKE 1 TABLET BY MOUTH EVERY DAY   . furosemide (LASIX) 20 MG tablet TAKE 1 TABLET (20 MG) BY MOUTH ONCE EVERY OTHER DAY   . insulin degludec (TRESIBA FLEXTOUCH) 100 UNIT/ML FlexTouch Pen Inject 0.4 mLs (40 Units total) into the skin daily. 03/29/2020: 45 units  . Insulin Pen Needle (PEN NEEDLES) 32G X 6 MM MISC 1 each by Does not apply route 2 (two) times daily.   . isosorbide mononitrate (IMDUR) 30 MG 24 hr tablet TAKE 1 TABLET BY MOUTH EVERY DAY   . lisinopril (ZESTRIL) 2.5 MG tablet Take 1 tablet (2.5 mg total) by mouth daily.   . metFORMIN (GLUCOPHAGE XR) 500 MG 24 hr tablet Take 1 tablet (500 mg total) by mouth 2 (two) times daily.   . tamsulosin (FLOMAX) 0.4 MG CAPS capsule TAKE 1 CAPSULE BY MOUTH EVERY DAY   . nitroGLYCERIN (NITROSTAT) 0.4 MG SL tablet Place 1 tablet (0.4 mg total) under the tongue every 5 (five) minutes as needed for chest pain.   . potassium chloride (MICRO-K) 10 MEQ CR capsule TAKE 1 CAPSULE (10 MEQ) BY MOUTH ONCE EVERY OTHER DAY WITH LASIX (FUROSEMIDE) (Patient not taking: Reported on 05/04/2020)   . VENTOLIN HFA 108 (90 Base) MCG/ACT inhaler Inhale 2 puffs into the lungs every 6 (six) hours as needed for wheezing or shortness of breath.  No facility-administered encounter medications on file as of 06/06/2020.     Objective:   Goals Addressed              This Visit's Progress     Patient Stated   .  PharmD "I don't know what I'm doing with my medications" (pt-stated)        CARE PLAN ENTRY (see longtitudinal plan of care for additional care plan information)  Current Barriers:  . Social, financial, community barriers o Notes he continues to have an upset stomach, associates w/ receipt of COVID vaccine . Diabetes: uncontrolled; complicated by chronic medical conditions including CHF, CAD, CKD, most recent A1c 9.2%; s/p COVID.  [name] Most recent eGFR: 54  mL/min . Current antihyperglycemic regimen: metformin XR 500 mg BID, Jardiance 25 mg, Trulicity 3 mg weekly, Tresiba 45 units o Metformin XR - higher doses cause stomach upset . Current blood glucose readings: prescribed QID testing, but notes his meter is just reading "error" right now. Wasting 3-4 strips every time he tries to test . Cardiovascular risk reduction: o Current hypertensive regimen: carvedilol 12.5 mg BID; furosemide 20 mg QOD + potassium 10 mEq QOD, lisinopril 2.5 mg daily o Current hyperlipidemia regimen: atorvastatin 80 mg daily; ezetimibe 10 mg daily o Current antiplatelet regimen: ASA 81 mg daily, clopidogrel 75 mg daily  . COPD: taking Symbicort 160/4.5 mcg PRN, albuterol PRN   Pharmacist Clinical Goal(s):  [name] Over the next 90 days, patient will work with PharmD and primary care provider to address optimized medication management  Interventions: . Comprehensive medication review performed, medication list updated in electronic medical record . Inter-disciplinary care team collaboration (see longitudinal plan of care) . Discussed importance of BG checks. Given patient frailty and difficulty manipulating traditional glucometer, along w/ history of hypoglycemia, CGM would be extremely beneficial. Will submit order to DME supplier.  [name] PCP f/u later this month.   Patient Self Care Activities:  . Patient will check blood glucose QID, document, and provide at future appointments . Patient will focus on medication adherence by utilizing his BID pill box . Patient will take medications as prescribed  Please see past updates related to this goal by clicking on the "Past Updates" button in the selected goal          Plan:  - Scheduled f/u face to face co-visit with PCP in ~ 3 weeks  Catie Darnelle Maffucci, PharmD, [place], [place] Pharmacist [place] [phone]

## 2020-06-06 NOTE — Patient Instructions (Signed)
Visit Information  Goals Addressed              This Visit's Progress     Patient Stated   .  PharmD "I don't know what I'm doing with my medications" (pt-stated)        CARE PLAN ENTRY (see longtitudinal plan of care for additional care plan information)  Current Barriers:  . Social, financial, community barriers o Notes he continues to have an upset stomach, associates w/ receipt of COVID vaccine . Diabetes: uncontrolled; complicated by chronic medical conditions including CHF, CAD, CKD, most recent A1c 9.2%; s/p COVID.  [name] Most recent eGFR: 54 mL/min . Current antihyperglycemic regimen: metformin XR 500 mg BID, Jardiance 25 mg, Trulicity 3 mg weekly, Tresiba 45 units o Metformin XR - higher doses cause stomach upset . Current blood glucose readings: prescribed QID testing, but notes his meter is just reading "error" right now. Wasting 3-4 strips every time he tries to test . Cardiovascular risk reduction: o Current hypertensive regimen: carvedilol 12.5 mg BID; furosemide 20 mg QOD + potassium 10 mEq QOD, lisinopril 2.5 mg daily o Current hyperlipidemia regimen: atorvastatin 80 mg daily; ezetimibe 10 mg daily o Current antiplatelet regimen: ASA 81 mg daily, clopidogrel 75 mg daily  . COPD: taking Symbicort 160/4.5 mcg PRN, albuterol PRN   Pharmacist Clinical Goal(s):  [name] Over the next 90 days, patient will work with PharmD and primary care provider to address optimized medication management  Interventions: . Comprehensive medication review performed, medication list updated in electronic medical record . Inter-disciplinary care team collaboration (see longitudinal plan of care) . Discussed importance of BG checks. Given patient frailty and difficulty manipulating traditional glucometer, along w/ history of hypoglycemia, CGM would be extremely beneficial. Will submit order to DME supplier.  [name] PCP f/u later this month.   Patient Self Care Activities:  . Patient will check blood  glucose QID, document, and provide at future appointments . Patient will focus on medication adherence by utilizing his BID pill box . Patient will take medications as prescribed  Please see past updates related to this goal by clicking on the "Past Updates" button in the selected goal         The patient verbalized understanding of instructions provided today and declined a print copy of patient instruction materials.    Plan:  - Scheduled f/u face to face co-visit with PCP in ~ 3 weeks  Catie Darnelle Maffucci, PharmD, [place], [place] Pharmacist [place] [phone]

## 2020-06-10 ENCOUNTER — Other Ambulatory Visit: Payer: Self-pay | Admitting: Family Medicine

## 2020-06-10 DIAGNOSIS — IMO0002 Reserved for concepts with insufficient information to code with codable children: Secondary | ICD-10-CM

## 2020-06-10 DIAGNOSIS — E1165 Type 2 diabetes mellitus with hyperglycemia: Secondary | ICD-10-CM

## 2020-06-29 ENCOUNTER — Ambulatory Visit (INDEPENDENT_AMBULATORY_CARE_PROVIDER_SITE_OTHER): Payer: Medicare Other | Admitting: Family Medicine

## 2020-06-29 ENCOUNTER — Ambulatory Visit: Payer: Medicare Other | Admitting: Pharmacist

## 2020-06-29 ENCOUNTER — Other Ambulatory Visit: Payer: Self-pay

## 2020-06-29 ENCOUNTER — Encounter: Payer: Self-pay | Admitting: Family Medicine

## 2020-06-29 DIAGNOSIS — I2510 Atherosclerotic heart disease of native coronary artery without angina pectoris: Secondary | ICD-10-CM

## 2020-06-29 DIAGNOSIS — E118 Type 2 diabetes mellitus with unspecified complications: Secondary | ICD-10-CM

## 2020-06-29 DIAGNOSIS — E1165 Type 2 diabetes mellitus with hyperglycemia: Secondary | ICD-10-CM

## 2020-06-29 DIAGNOSIS — E7800 Pure hypercholesterolemia, unspecified: Secondary | ICD-10-CM

## 2020-06-29 DIAGNOSIS — M5441 Lumbago with sciatica, right side: Secondary | ICD-10-CM

## 2020-06-29 DIAGNOSIS — I10 Essential (primary) hypertension: Secondary | ICD-10-CM

## 2020-06-29 DIAGNOSIS — K219 Gastro-esophageal reflux disease without esophagitis: Secondary | ICD-10-CM | POA: Diagnosis not present

## 2020-06-29 DIAGNOSIS — M545 Low back pain, unspecified: Secondary | ICD-10-CM | POA: Insufficient documentation

## 2020-06-29 DIAGNOSIS — IMO0002 Reserved for concepts with insufficient information to code with codable children: Secondary | ICD-10-CM

## 2020-06-29 LAB — HEMOGLOBIN A1C: Hgb A1c MFr Bld: 9.3 % — ABNORMAL HIGH (ref 4.6–6.5)

## 2020-06-29 LAB — GLUCOSE, POCT (MANUAL RESULT ENTRY): POC Glucose: 225 mg/dl — AB (ref 70–99)

## 2020-06-29 MED ORDER — BACLOFEN 10 MG PO TABS: 10.0000 mg | ORAL_TABLET | Freq: Three times a day (TID) | ORAL | 0 refills | Status: DC | PRN

## 2020-06-29 NOTE — Progress Notes (Signed)
Marikay Alar, MD Phone: [phone]  Gerrit Friends Alkins Sr. is a 69 y.o. male who presents today for f/u.  DIABETES Disease Monitoring: Blood Sugar ranges-not checking Polyuria/phagia/dipsia- no      Optho- due Medications: Compliance- taking truliticy, jardiance, metformin, tresiba Hypoglycemic symptoms- no  HYPERTENSION/CAD Disease Monitoring  Home BP Monitoring not checking Chest pain- no    Dyspnea- no Medications  Compliance-  Taking lipitor, coreg, plavix, zetia, lasix, imdur, lisinopril.  Edema- no  GERD: Patient notes this only occurs when he drinks milk.  No blood in stool, dysphagia, or abdominal pain.  Low back pain: Patient notes this has been bothering him for 3 days.  No injury.  The sharp pain in his right low back that does radiate down the back of his right thigh though does not get to his feet.  No numbness.  Feels as though the leg gives out on him at times.  He has not taken any medications for this.     Social History   Tobacco Use  Smoking Status Former Smoker  . Packs/day: 3.00  . Years: 0.00  . Pack years: 0.00  . Types: Cigarettes  . Quit date: 09/07/2009  . Years since quitting: 10.8  Smokeless Tobacco Never Used  Tobacco Comment   quit june 2011     ROS see history of present illness  Objective  Physical Exam Vitals:   06/29/20 0932 06/29/20 0954  BP: 140/80 130/80  Pulse: (!) 105   Temp: 97.6 F (36.4 C)   SpO2: 96%     BP Readings from Last 3 Encounters:  06/29/20 130/80  03/29/20 110/80  12/28/19 130/80   Wt Readings from Last 3 Encounters:  06/29/20 229 lb 6.4 oz (104.1 kg)  03/29/20 238 lb 3.2 oz (108 kg)  12/28/19 238 lb (108 kg)    Physical Exam Constitutional:      General: He is not in acute distress.    Appearance: He is not diaphoretic.  Cardiovascular:     Rate and Rhythm: Normal rate and regular rhythm.     Heart sounds: Normal heart sounds.  Pulmonary:     Effort: Pulmonary effort is normal.     Breath  sounds: Normal breath sounds.  Musculoskeletal:     Comments: No midline spine tenderness, no midline spine step-off, no muscular back tenderness  Skin:    General: Skin is warm and dry.  Neurological:     Mental Status: He is alert.     Comments: 5/5 strength bilateral quads, hamstrings, plantar flexion, and dorsiflexion, sensation light touch intact bilateral lower extremities      Assessment/Plan: Please see individual problem list.  Problem List Items Addressed This Visit    CAD (coronary artery disease)    Asymptomatic.  He will continue atorvastatin 80 mg daily, Plavix 75 mg daily, Zetia 10 mg daily, Imdur 30 mg daily, lisinopril 2.5 mg daily, and carvedilol 12.5 mg twice daily.      Diabetes mellitus type 2, uncontrolled, with complications (HCC)    Check A1c.  Check fingerstick glucose.  He will continue Trulicity 3 mg once daily, Jardiance 25 mg once daily, Metformin XR 500 mg twice daily, and Tresiba 45 units once daily.  He will see ophthalmology in the new year.      Relevant Orders   HgB A1c   POCT Glucose (CBG)   Essential hypertension    Elevated today though the patient is having back pain.  He will continue carvedilol 12.5 mg  twice daily, Lasix 20 mg every other day, Imdur 30 mg daily, and lisinopril 2.5 mg daily.      GERD (gastroesophageal reflux disease)    He will start on omeprazole 20 mg once daily for 14 days.      Low back pain    Suspect nerve impingement though could also have some muscle strain or spasm.  His glucose was elevated and his diabetes is likely uncontrolled making use of prednisone not a good idea.  We will treat with a muscle relaxer.  Discussed that this could make him drowsy.  Advised not to drive while taking this.  We will check an A1c and if it is somehow less than 8 could trial prednisone.      Relevant Medications   baclofen (LIORESAL) 10 MG tablet      This visit occurred during the SARS-CoV-2 public health emergency.  Safety  protocols were in place, including screening questions prior to the visit, additional usage of staff PPE, and extensive cleaning of exam room while observing appropriate contact time as indicated for disinfecting solutions.    Marikay Alar, MD [facility]

## 2020-06-29 NOTE — Assessment & Plan Note (Signed)
Check A1c.  Check fingerstick glucose.  He will continue Trulicity 3 mg once daily, Jardiance 25 mg once daily, Metformin XR 500 mg twice daily, and Tresiba 45 units once daily.  He will see ophthalmology in the new year.

## 2020-06-29 NOTE — Patient Instructions (Signed)
Nice to see you. We will check an A1c. We will try baclofen as a muscle relaxer for your back.  If you develop weakness, numbness, loss of bowel or bladder function, or worsening pain please be evaluated in person again. We will try you on omeprazole 20 mg once daily for your reflux.  Please avoid milk.

## 2020-06-29 NOTE — Patient Instructions (Addendum)
Call [facility] - [phone] to place the order for the FreeStyle Libre 2. You do not need your phone to use this device.   Call me when you receive this. We will schedule a face to face visit for [place] to show you how to use it.    We will be in touch about your lab work.   Catie Feliz Beam, PharmD [phone]  Visit Information  Patient Care Plan: Medication Management    Problem Identified: Diabetes, Hypertension, Depression, CHF     Long-Range Goal: Disease Progression Prevention   Note:   Current Barriers:  . Unable to independently monitor therapeutic efficacy . Unable to achieve control of diabetes  . Low level of healthcare literacy  Pharmacist Clinical Goal(s):  [name] Over the next 90 days, patient will achieve adherence to monitoring guidelines and medication adherence to achieve therapeutic efficacy. . Over the next 90 days, patient will improve control of diabetes as evidenced by A1c.   Interventions: . Inter-disciplinary care team collaboration (see longitudinal plan of care) . Comprehensive medication review performed; medication list updated in electronic medical record  Diabetes: . Uncontrolled; current treatment: metformin XR 500 mg BID, Trulicity 3 mg weekly, Jardiance 25 mg daily, Tresiba 45 units daily  . Current glucose readings: not checking. Notes that back pain is limiting his ability to do other things. Notes that [facility] called regarding CGM order and that he has a $0 copay, but he wasn't sure if he should go ahead and order, since he will be changing insurance plans in January . Fasting glucose >200 today. A1c pending . Attempted to call [facility] together while patient was in office, however, they are on [facility] Time and are not open yet. Phone number provided to patient to call this afternoon to authorize FreeStyle [place] 2 order and ship. Encouraged patient to call me when he receives so that we can schedule  face to face with me or clinic RN for CGM teaching.  . A1c pending. Patient did not bring medications with him today so we could not confirm adherence.   Hypertension, CAD, CHF . Controlled; current treatment: carvedilol 12.5 mg BID, furosemide 20 mg every other day, isosorbide 30 mg daily, lisinopril 2.5 mg daily, potassium 10 mEq every other day.   . Antiplatelet: aspirin 81 mg daily  . Recommend to continue current regimen. Will discuss adherence packaging strategies at next phone appointment to improve adherence. Patient in too much pain today to discuss.   Hyperlipidemia: . Very well controlled; current treatment: atorvastatin 80 mg daily, ezetimibe 10 mg daily; suspect non-adherence to atorvastatin 80 mg daily at the time of pre-ezetimibe lipid panel (LDL was 91) as most recent LDL is 29.  [name] Recommended to continue current regimen at this time. Consider d/c ezetimibe moving forward to reduce pill burden with lipid recheck to ensure LDL still <70.   Depression/Anxiety: . Appropriately well controlled; escitalopram 10 mg daily . Recommended to continue current regimen  BPH . Appropriately well controlled; tamsulosin 0.4 mg daily . Recommended to continue current regimen  Patient Goals/Self-Care Activities . Over the next 90 days, patient will:  - take medications as prescribed check blood glucose at least TID using CGM, document, and provide at future appointments collaborate with provider on medication access solutions  Follow Up Plan: Telephone follow up appointment with care management team member scheduled for: ~ 2 weeks if I do not hear back from patient regarding CGM arrival  Print copy of patient instructions, educational materials, and care plan provided in person.     Plan: Telephone follow up appointment with care management team member scheduled for:~ 2 weeks if I have not heard back from patient regarding CGM arrival  Catie Feliz Beam, PharmD, [place],  CPP Clinical Pharmacist [facility] [employer] [phone]

## 2020-06-29 NOTE — Chronic Care Management (AMB) (Signed)
Chronic Care Management   Pharmacy Note  06/29/2020 Name: Danny Mcgregor Plaisted Sr. MRN: [id number] DOB: [birth year]   Subjective:  Danny Estimable Fronczak Sr. is a 69 y.o. year old male who is a primary care patient of [name], [name], MD. The CCM team was consulted for assistance with chronic disease management and care coordination needs.    Engaged with patient face to face for follow up visit in response to provider referral for pharmacy case management and/or care coordination services.   Consent to Services:  Mr. Rudd was given information about Chronic Care Management services, agreed to services, and gave verbal consent prior to initiation of services on 09/17/19 Please see initial visit note for detailed documentation.   SDOH (Social Determinants of Health) assessments and interventions performed:  SDOH Interventions     Most Recent Value  SDOH Interventions  Financial Strain Interventions Intervention Not Indicated       Objective:  Lab Results  Component Value Date   CREATININE 1.34 03/29/2020   CREATININE 1.30 01/05/2020   CREATININE 1.67 (H) 12/28/2019    Lab Results  Component Value Date   HGBA1C 9.2 (H) 03/29/2020       Component Value Date/Time   CHOL 86 03/29/2020 1102   TRIG 54.0 03/29/2020 1102   HDL 46.30 03/29/2020 1102   CHOLHDL 2 03/29/2020 1102   VLDL 10.8 03/29/2020 1102   LDLCALC 29 03/29/2020 1102   LDLDIRECT 64.0 03/20/2018 0950    BP Readings from Last 3 Encounters:  06/29/20 130/80  03/29/20 110/80  12/28/19 130/80    Assessment/Interventions: Review of patient past medical history, allergies, medications, health status, including review of consultants reports, laboratory and other test data, was performed as part of comprehensive evaluation and provision of chronic care management services.   No Known Allergies  Medications Reviewed Today    Reviewed by De Hollingshead, RPH-CPP (Pharmacist) on 06/29/20 at 1042  Med List Status: <None>    Medication Order Taking? Sig Documenting Provider Last Dose Status Informant  ACCU-CHEK GUIDE test strip [id number] No CHECK CBGS 4 TIMES A DAY [name], MD Taking Active   acetaminophen (TYLENOL) 325 MG tablet [id number] No Take 650 mg by mouth every 6 (six) hours as needed. [provider] Taking Active Self           Med Note Darnelle Maffucci, Maralyn Sago Nov 05, 2019  1:23 PM) Taking 1-2 tablets every 4 hours  aspirin 81 MG tablet [id number] No Take 81 mg by mouth daily.   [provider] Taking Active Self  atorvastatin (LIPITOR) 80 MG tablet [id number] No Take 1 tablet (80 mg total) by mouth daily. [name], MD Taking Active   baclofen (LIORESAL) 10 MG tablet [id number]  Take 1 tablet (10 mg total) by mouth 3 (three) times daily as needed for muscle spasms. [name], MD  Active   blood glucose meter kit and supplies KIT [id number] No Dispense based on patient and insurance preference. Check CBGs 4 times daily. Dx code E11.9. [name], MD Taking Active   budesonide-formoterol [facility]) 160-4.5 MCG/ACT inhaler [id number] No TAKE 2 PUFFS BY MOUTH TWICE A DAY [name], MD Taking Active   carvedilol (COREG) 12.5 MG tablet [id number] No TAKE 1 TABLET (12.5 MG TOTAL) BY MOUTH 2 (TWO) TIMES DAILY WITH A MEAL. [name], MD Taking Active   clopidogrel (PLAVIX) 75 MG tablet [id number] No TAKE 1 TABLET BY MOUTH EVERY DAY Gollan,  Kathlene November, MD Taking Active   Dulaglutide (TRULICITY) 3 AG/5.3MI SOPN [id number] No Inject 3 mg as directed once a week. [name], MD Taking Active            Med Note Mayo Ao Feb 29, 2020  3:12 PM)    empagliflozin (JARDIANCE) 25 MG TABS tablet [id number] No Take 25 mg by mouth daily. [name], MD Taking Active   escitalopram (LEXAPRO) 10 MG tablet [id number] No TAKE 1 TABLET BY MOUTH EVERY DAY Einar Pheasant, MD Taking Active   ezetimibe (ZETIA) 10 MG tablet [id number] No TAKE  1 TABLET BY MOUTH EVERY DAY [name], MD Taking Active   furosemide (LASIX) 20 MG tablet [id number] No TAKE 1 TABLET (20 MG) BY MOUTH ONCE EVERY OTHER DAY Gollan, Kathlene November, MD Taking Active   Insulin Pen Needle (PEN NEEDLES) 32G X 6 MM MISC [id number] No 1 each by Does not apply route 2 (two) times daily. [name], MD Taking Active   isosorbide mononitrate (IMDUR) 30 MG 24 hr tablet [id number] No TAKE 1 TABLET BY MOUTH EVERY DAY Gollan, Kathlene November, MD Taking Active   lisinopril (ZESTRIL) 2.5 MG tablet [id number] No Take 1 tablet (2.5 mg total) by mouth daily. [name], MD Taking Active   metFORMIN (GLUCOPHAGE XR) 500 MG 24 hr tablet [id number] No Take 1 tablet (500 mg total) by mouth 2 (two) times daily. [name], MD Taking Active Self           Med Note Mayo Ao Dec 28, 2019 10:02 AM)    nitroGLYCERIN (NITROSTAT) 0.4 MG SL tablet [id number] No Place 1 tablet (0.4 mg total) under the tongue every 5 (five) minutes as needed for chest pain. [name], MD Taking Active Self  potassium chloride (MICRO-K) 10 MEQ CR capsule [id number] No TAKE 1 CAPSULE (10 MEQ) BY MOUTH ONCE EVERY OTHER DAY WITH LASIX (FUROSEMIDE) Gollan, Kathlene November, MD Taking Active   tamsulosin (FLOMAX) 0.4 MG CAPS capsule [id number] No TAKE 1 CAPSULE BY MOUTH EVERY DAY Einar Pheasant, MD Taking Active   TRESIBA FLEXTOUCH 100 UNIT/ML FlexTouch Pen [id number] No INJECT (45 UNITS TOTAL) INTO THE SKIN DAILY. [name], MD Taking Active   VENTOLIN HFA 108 [phone] Base) MCG/ACT inhaler [id number] No Inhale 2 puffs into the lungs every 6 (six) hours as needed for wheezing or shortness of breath. [name], MD Taking Active           Patient Active Problem List   Diagnosis Date Noted  . Low back pain 06/29/2020  . Onychomycosis 03/29/2020  . Fall 03/29/2020  . AKI (acute kidney injury) ([place]) 12/28/2019  . Acute hypoxemic respiratory failure due to COVID-19 ([place])  07/29/2019  . Coronary artery disease of native artery of native heart with stable angina pectoris ([place]) 12/12/2018  . Pure hypercholesterolemia 12/12/2018  . Chest pain 10/31/2018  . Atherosclerosis of native arteries of extremity with intermittent claudication ([place]) 02/11/2018  . Decreased pedal pulses 07/02/2017  . Orthostasis 05/07/2017  . Anxiety and depression 05/07/2017  . CKD (chronic kidney disease) stage 3, GFR 30-59 ml/min (HCC) 03/06/2017  . Diabetic neuropathy ([place]) 03/06/2017  . Chronic combined systolic and diastolic CHF (congestive heart failure) ([place]) 03/05/2017  . Morbid obesity ([place]) 06/18/2016  . H/O medication noncompliance 06/18/2016  . COPD (chronic obstructive pulmonary disease) ([place]) 06/20/2015  . GERD (gastroesophageal reflux disease) 12/20/2014  . Diabetes mellitus  type 2, uncontrolled, with complications ([place]) 24/26/8341  . Hyperlipidemia 02/10/2010  . Essential hypertension 02/10/2010  . CAD (coronary artery disease) 02/10/2010    Medication Assistance: None required. Patient affirms current coverage meets needs.   Patient Care Plan: Medication Management    Problem Identified: Diabetes, Hypertension, Depression, CHF     Long-Range Goal: Disease Progression Prevention   Note:   Current Barriers:  . Unable to independently monitor therapeutic efficacy . Unable to achieve control of diabetes  . Low level of healthcare literacy  Pharmacist Clinical Goal(s):  [name] Over the next 90 days, patient will achieve adherence to monitoring guidelines and medication adherence to achieve therapeutic efficacy. . Over the next 90 days, patient will improve control of diabetes as evidenced by A1c.   Interventions: . Inter-disciplinary care team collaboration (see longitudinal plan of care) . Comprehensive medication review performed; medication list updated in electronic medical record  Diabetes: . Uncontrolled; current treatment: metformin XR 962 mg BID, Trulicity  3 mg weekly, Jardiance 25 mg daily, Tresiba 45 units daily  . Current glucose readings: not checking. Notes that back pain is limiting his ability to do other things. Notes that [facility] called regarding CGM order and that he has a $0 copay, but he wasn't sure if he should go ahead and order, since he will be changing insurance plans in January . Fasting glucose >200 today. A1c pending . Attempted to call [facility] together while patient was in office, however, they are on [facility] Time and are not open yet. Phone number provided to patient to call this afternoon to authorize FreeStyle [place] 2 order and ship. Encouraged patient to call me when he receives so that we can schedule face to face with me or clinic RN for CGM teaching.  . A1c pending. Patient did not bring medications with him today so we could not confirm adherence.   Hypertension, CAD, CHF . Controlled; current treatment: carvedilol 12.5 mg BID, furosemide 20 mg every other day, isosorbide 30 mg daily, lisinopril 2.5 mg daily, potassium 10 mEq every other day.   . Antiplatelet: aspirin 81 mg daily  . Recommend to continue current regimen. Will discuss adherence packaging strategies at next phone appointment to improve adherence. Patient in too much pain today to discuss.   Hyperlipidemia: . Very well controlled; current treatment: atorvastatin 80 mg daily, ezetimibe 10 mg daily; suspect non-adherence to atorvastatin 80 mg daily at the time of pre-ezetimibe lipid panel (LDL was 91) as most recent LDL is 29.  [name] Recommended to continue current regimen at this time. Consider d/c ezetimibe moving forward to reduce pill burden with lipid recheck to ensure LDL still <70.   Depression/Anxiety: . Appropriately well controlled; escitalopram 10 mg daily . Recommended to continue current regimen  BPH . Appropriately well controlled; tamsulosin 0.4 mg daily . Recommended to continue current regimen  Patient  Goals/Self-Care Activities . Over the next 90 days, patient will:  - take medications as prescribed check blood glucose at least TID using CGM, document, and provide at future appointments collaborate with provider on medication access solutions  Follow Up Plan: Telephone follow up appointment with care management team member scheduled for: ~ 2 weeks if I do not hear back from patient regarding CGM arrival       Plan: Telephone follow up appointment with care management team member scheduled for:~ 2 weeks if I have not heard back from patient regarding CGM arrival  Catie Darnelle Maffucci, PharmD, [place], CPP  [place] [phone]

## 2020-06-29 NOTE — Assessment & Plan Note (Signed)
He will start on omeprazole 20 mg once daily for 14 days.

## 2020-06-29 NOTE — Assessment & Plan Note (Addendum)
Suspect nerve impingement though could also have some muscle strain or spasm.  His glucose was elevated and his diabetes is likely uncontrolled making use of prednisone not a good idea.  We will treat with a muscle relaxer.  Discussed that this could make him drowsy.  Advised not to drive while taking this.  We will check an A1c and if it is somehow less than 8 could trial prednisone.

## 2020-06-29 NOTE — Assessment & Plan Note (Signed)
Elevated today though the patient is having back pain.  He will continue carvedilol 12.5 mg twice daily, Lasix 20 mg every other day, Imdur 30 mg daily, and lisinopril 2.5 mg daily.

## 2020-06-29 NOTE — Assessment & Plan Note (Addendum)
Asymptomatic.  He will continue atorvastatin 80 mg daily, Plavix 75 mg daily, Zetia 10 mg daily, Imdur 30 mg daily, lisinopril 2.5 mg daily, and carvedilol 12.5 mg twice daily.

## 2020-07-04 ENCOUNTER — Other Ambulatory Visit: Payer: Self-pay | Admitting: Internal Medicine

## 2020-07-07 ENCOUNTER — Other Ambulatory Visit: Payer: Self-pay

## 2020-07-07 DIAGNOSIS — E1165 Type 2 diabetes mellitus with hyperglycemia: Secondary | ICD-10-CM | POA: Diagnosis not present

## 2020-07-11 ENCOUNTER — Ambulatory Visit: Payer: Medicare Other | Admitting: Pharmacist

## 2020-07-11 DIAGNOSIS — I2510 Atherosclerotic heart disease of native coronary artery without angina pectoris: Secondary | ICD-10-CM

## 2020-07-11 DIAGNOSIS — E7800 Pure hypercholesterolemia, unspecified: Secondary | ICD-10-CM

## 2020-07-11 DIAGNOSIS — I10 Essential (primary) hypertension: Secondary | ICD-10-CM

## 2020-07-11 DIAGNOSIS — IMO0002 Reserved for concepts with insufficient information to code with codable children: Secondary | ICD-10-CM

## 2020-07-11 DIAGNOSIS — E1165 Type 2 diabetes mellitus with hyperglycemia: Secondary | ICD-10-CM

## 2020-07-11 MED ORDER — TRULICITY 4.5 MG/0.5ML ~~LOC~~ SOAJ: 4.5000 mg | SUBCUTANEOUS | 3 refills | Status: DC

## 2020-07-11 NOTE — Chronic Care Management (AMB) (Signed)
Chronic Care Management   Pharmacy Note  07/11/2020 Name: Danny Mayhall Koroma Sr. MRN: [id number] DOB: [birth year]   Subjective:  Danny Estimable Barradas Sr. is a 69 y.o. year old male who is a primary care patient of [name], [name], MD. The CCM team was consulted for assistance with chronic disease management and care coordination needs.    Engaged with patient by telephone for follow up visit in response to provider referral for pharmacy case management and/or care coordination services. This was scheduled to be a face to face visit, but patient reports stomach upset today and not feeling like getting out of bed. Denies that this is chronic (therefore unlikely to be related to Trulicity)  Consent to Services:  Mr. Kranz was given information about Chronic Care Management services, agreed to services, and gave verbal consent prior to initiation of services on 09/17/19. Please see initial visit note for detailed documentation.   SDOH (Social Determinants of Health) assessments and interventions performed:  SDOH Interventions     Most Recent Value  SDOH Interventions  Financial Strain Interventions Intervention Not Indicated       Objective:  Lab Results  Component Value Date   CREATININE 1.34 03/29/2020   CREATININE 1.30 01/05/2020   CREATININE 1.67 (H) 12/28/2019    Lab Results  Component Value Date   HGBA1C 9.3 (H) 06/29/2020       Component Value Date/Time   CHOL 86 03/29/2020 1102   TRIG 54.0 03/29/2020 1102   HDL 46.30 03/29/2020 1102   CHOLHDL 2 03/29/2020 1102   VLDL 10.8 03/29/2020 1102   LDLCALC 29 03/29/2020 1102   LDLDIRECT 64.0 03/20/2018 0950     BP Readings from Last 3 Encounters:  06/29/20 130/80  03/29/20 110/80  12/28/19 130/80    Assessment/Interventions: Review of patient past medical history, allergies, medications, health status, including review of consultants reports, laboratory and other test data, was performed as part of comprehensive evaluation  and provision of chronic care management services.   No Known Allergies  Medications Reviewed Today    Reviewed by De Hollingshead, RPH-CPP (Pharmacist) on 06/29/20 at 1042  Med List Status: <None>  Medication Order Taking? Sig Documenting Provider Last Dose Status Informant  ACCU-CHEK GUIDE test strip [id number] No CHECK CBGS 4 TIMES A DAY [name], MD Taking Active   acetaminophen (TYLENOL) 325 MG tablet [id number] No Take 650 mg by mouth every 6 (six) hours as needed. [provider] Taking Active Self           Med Note Darnelle Maffucci, Maralyn Sago Nov 05, 2019  1:23 PM) Taking 1-2 tablets every 4 hours  aspirin 81 MG tablet [id number] No Take 81 mg by mouth daily.   [provider] Taking Active Self  atorvastatin (LIPITOR) 80 MG tablet [id number] No Take 1 tablet (80 mg total) by mouth daily. [name], MD Taking Active   baclofen (LIORESAL) 10 MG tablet [id number]  Take 1 tablet (10 mg total) by mouth 3 (three) times daily as needed for muscle spasms. [name], MD  Active   blood glucose meter kit and supplies KIT [id number] No Dispense based on patient and insurance preference. Check CBGs 4 times daily. Dx code E11.9. [name], MD Taking Active   budesonide-formoterol [facility]) 160-4.5 MCG/ACT inhaler [id number] No TAKE 2 PUFFS BY MOUTH TWICE A DAY [name], MD Taking Active   carvedilol (COREG) 12.5 MG tablet [id number] No TAKE 1 TABLET (12.5  MG TOTAL) BY MOUTH 2 (TWO) TIMES DAILY WITH A MEAL. [name], MD Taking Active   clopidogrel (PLAVIX) 75 MG tablet [id number] No TAKE 1 TABLET BY MOUTH EVERY DAY Gollan, Kathlene November, MD Taking Active   Dulaglutide (TRULICITY) 3 BH/4.1PF SOPN [id number] No Inject 3 mg as directed once a week. [name], MD Taking Active            Med Note Mayo Ao Feb 29, 2020  3:12 PM)    empagliflozin (JARDIANCE) 25 MG TABS tablet [id number] No Take 25 mg by mouth daily.  [name], MD Taking Active   escitalopram (LEXAPRO) 10 MG tablet [id number] No TAKE 1 TABLET BY MOUTH EVERY DAY Einar Pheasant, MD Taking Active   ezetimibe (ZETIA) 10 MG tablet [id number] No TAKE 1 TABLET BY MOUTH EVERY DAY [name], MD Taking Active   furosemide (LASIX) 20 MG tablet [id number] No TAKE 1 TABLET (20 MG) BY MOUTH ONCE EVERY OTHER DAY Gollan, Kathlene November, MD Taking Active   Insulin Pen Needle (PEN NEEDLES) 32G X 6 MM MISC [id number] No 1 each by Does not apply route 2 (two) times daily. [name], MD Taking Active   isosorbide mononitrate (IMDUR) 30 MG 24 hr tablet [id number] No TAKE 1 TABLET BY MOUTH EVERY DAY Gollan, Kathlene November, MD Taking Active   lisinopril (ZESTRIL) 2.5 MG tablet [id number] No Take 1 tablet (2.5 mg total) by mouth daily. [name], MD Taking Active   metFORMIN (GLUCOPHAGE XR) 500 MG 24 hr tablet [id number] No Take 1 tablet (500 mg total) by mouth 2 (two) times daily. [name], MD Taking Active Self           Med Note Mayo Ao Dec 28, 2019 10:02 AM)    nitroGLYCERIN (NITROSTAT) 0.4 MG SL tablet [id number] No Place 1 tablet (0.4 mg total) under the tongue every 5 (five) minutes as needed for chest pain. [name], MD Taking Active Self  potassium chloride (MICRO-K) 10 MEQ CR capsule [id number] No TAKE 1 CAPSULE (10 MEQ) BY MOUTH ONCE EVERY OTHER DAY WITH LASIX (FUROSEMIDE) Gollan, Kathlene November, MD Taking Active   tamsulosin (FLOMAX) 0.4 MG CAPS capsule [id number] No TAKE 1 CAPSULE BY MOUTH EVERY DAY Einar Pheasant, MD Taking Active   TRESIBA FLEXTOUCH 100 UNIT/ML FlexTouch Pen [id number] No INJECT (45 UNITS TOTAL) INTO THE SKIN DAILY. [name], MD Taking Active   VENTOLIN HFA 108 [phone] Base) MCG/ACT inhaler [id number] No Inhale 2 puffs into the lungs every 6 (six) hours as needed for wheezing or shortness of breath. [name], MD Taking Active           Patient Active Problem List    Diagnosis Date Noted  . Low back pain 06/29/2020  . Onychomycosis 03/29/2020  . Fall 03/29/2020  . AKI (acute kidney injury) ([place]) 12/28/2019  . Acute hypoxemic respiratory failure due to COVID-19 ([place]) 07/29/2019  . Coronary artery disease of native artery of native heart with stable angina pectoris ([place]) 12/12/2018  . Pure hypercholesterolemia 12/12/2018  . Chest pain 10/31/2018  . Atherosclerosis of native arteries of extremity with intermittent claudication ([place]) 02/11/2018  . Decreased pedal pulses 07/02/2017  . Orthostasis 05/07/2017  . Anxiety and depression 05/07/2017  . CKD (chronic kidney disease) stage 3, GFR 30-59 ml/min (HCC) 03/06/2017  . Diabetic neuropathy ([place]) 03/06/2017  . Chronic combined systolic and diastolic CHF (congestive heart failure) (  [place]) 03/05/2017  . Morbid obesity ([place]) 06/18/2016  . H/O medication noncompliance 06/18/2016  . COPD (chronic obstructive pulmonary disease) ([place]) 06/20/2015  . GERD (gastroesophageal reflux disease) 12/20/2014  . Diabetes mellitus type 2, uncontrolled, with complications ([place]) 85/09/7739  . Hyperlipidemia 02/10/2010  . Essential hypertension 02/10/2010  . CAD (coronary artery disease) 02/10/2010    Medication Assistance: None required. Patient affirms current coverage meets needs.   Patient Care Plan: Medication Management    Problem Identified: Diabetes, Hypertension, Depression, CHF     Long-Range Goal: Disease Progression Prevention   This Visit's Progress: On track  Priority: High  Note:   Current Barriers:  . Unable to independently monitor therapeutic efficacy . Unable to achieve control of diabetes  . Low level of healthcare literacy  Pharmacist Clinical Goal(s):  [name] Over the next 90 days, patient will achieve adherence to monitoring guidelines and medication adherence to achieve therapeutic efficacy. . Over the next 90 days, patient will improve control of diabetes as evidenced by A1c.    Interventions: . Inter-disciplinary care team collaboration (see longitudinal plan of care) . Comprehensive medication review performed; medication list updated in electronic medical record  Diabetes: . Uncontrolled; current treatment: metformin XR 287 mg BID, Trulicity 3 mg weekly, Jardiance 25 mg daily, Tresiba 45 units daily  . Current glucose readings: notes that he checked the other day and fasting was 220. Not checking regularly. Even though copay was $0, he would like to wait until January to starting using CGM therapy. . Increase Trulicity to 4.5 mg weekly. Updated script sent to [facility]. Patient notes he will pick this up tomorrow.  . Continue metformin XR 500 mg BID, Jardiance 25 mg daily, Tresiba 45 units daily. Will assist patient in calling to order CGM in January.   Hypertension, CAD, CHF . Controlled; current treatment: carvedilol 12.5 mg BID, furosemide 20 mg every other day, isosorbide 30 mg daily, lisinopril 2.5 mg daily, potassium 10 mEq every other day.   . Antiplatelet: aspirin 81 mg daily  . Recommend to continue current regimen. Will discuss adherence packaging strategies in the future, patient not feeling up to talking today.   Hyperlipidemia: . Very well controlled; current treatment: atorvastatin 80 mg daily, ezetimibe 10 mg daily; suspect non-adherence to atorvastatin 80 mg daily at the time of pre-ezetimibe lipid panel (LDL was 91) as most recent LDL is 29.  [name] Recommended to continue current regimen at this time. Consider d/c ezetimibe moving forward to reduce pill burden with lipid recheck to ensure LDL still <70.   Depression/Anxiety: . Appropriately well controlled; escitalopram 10 mg daily . Recommended to continue current regimen  BPH . Appropriately well controlled; tamsulosin 0.4 mg daily . Recommended to continue current regimen  Patient Goals/Self-Care Activities . Over the next 90 days, patient will:  - take medications as prescribed check  blood glucose at least TID using CGM, document, and provide at future appointments collaborate with provider on medication access solutions  Follow Up Plan: Telephone follow up appointment with care management team member scheduled for: ~ 4 weeks for face to face        Plan: Face to Face appointment with care management team member scheduled for:  ~ 4 weeks to assist in CGM procurement  Catie Darnelle Maffucci, PharmD, [place], [place] Pharmacist [place] [phone]

## 2020-07-11 NOTE — Patient Instructions (Signed)
Danny [name],   Increase Trulicity to 4.5 mg weekly. I sent the new prescription for this strength into the pharmacy. Continue metformin XR 500 mg twice daily, Jardiance 25 mg daily, and Tresiba insulin 45 units daily.   Please keep checking your blood sugars. Call [facility] to order the [facility] 2 meter - they told you that they copay was $0, so there is no reason you have to wait until January to order this.   Please call me with any questions or concerns.   Catie Feliz Beam, PharmD [phone]  Visit Information  Patient Care Plan: Medication Management    Problem Identified: Diabetes, Hypertension, Depression, CHF     Long-Range Goal: Disease Progression Prevention   This Visit's Progress: On track  Priority: High  Note:   Current Barriers:  . Unable to independently monitor therapeutic efficacy . Unable to achieve control of diabetes  . Low level of healthcare literacy  Pharmacist Clinical Goal(s):  [name] Over the next 90 days, patient will achieve adherence to monitoring guidelines and medication adherence to achieve therapeutic efficacy. . Over the next 90 days, patient will improve control of diabetes as evidenced by A1c.   Interventions: . Inter-disciplinary care team collaboration (see longitudinal plan of care) . Comprehensive medication review performed; medication list updated in electronic medical record  Diabetes: . Uncontrolled; current treatment: metformin XR 500 mg BID, Trulicity 3 mg weekly, Jardiance 25 mg daily, Tresiba 45 units daily  . Current glucose readings: notes that he checked the other day and fasting was 220. Not checking regularly. Even though copay was $0, he would like to wait until January to starting using CGM therapy. . Increase Trulicity to 4.5 mg weekly. [facility] script [facility]. Patient notes he will pick this up tomorrow.  . Continue metformin XR 500 mg BID, Jardiance 25 mg daily, Tresiba 45 units daily. Will assist patient in  calling to order CGM in January.   Hypertension, CAD, CHF . Controlled; current treatment: carvedilol 12.5 mg BID, furosemide 20 mg every other day, isosorbide 30 mg daily, lisinopril 2.5 mg daily, potassium 10 mEq every other day.   . Antiplatelet: aspirin 81 mg daily  . Recommend to continue current regimen. Will discuss adherence packaging strategies in the future, patient not feeling up to talking today.   Hyperlipidemia: . Very well controlled; current treatment: atorvastatin 80 mg daily, ezetimibe 10 mg daily; suspect non-adherence to atorvastatin 80 mg daily at the time of pre-ezetimibe lipid panel (LDL was 91) as most recent LDL is 29.  [name] Recommended to continue current regimen at this time. Consider d/c ezetimibe moving forward to reduce pill burden with lipid recheck to ensure LDL still <70.   Depression/Anxiety: . Appropriately well controlled; escitalopram 10 mg daily . Recommended to continue current regimen  BPH . Appropriately well controlled; tamsulosin 0.4 mg daily . Recommended to continue current regimen  Patient Goals/Self-Care Activities . Over the next 90 days, patient will:  - take medications as prescribed check blood glucose at least TID using CGM, document, and provide at future appointments collaborate with provider on medication access solutions  Follow Up Plan: Telephone follow up appointment with care management team member scheduled for: ~ 4 weeks for face to face       The patient verbalized understanding of instructions, educational materials, and care plan provided today and agreed to receive a mailed copy of patient instructions, educational materials, and care plan.    Plan: Face to Face appointment with  care management team member scheduled for:  ~ 4 weeks to assist in CGM procurement  Catie Feliz Beam, PharmD, [place], CPP Clinical Pharmacist [facility] [place] [employer] [phone]

## 2020-07-13 ENCOUNTER — Other Ambulatory Visit: Payer: Self-pay | Admitting: Family Medicine

## 2020-07-13 DIAGNOSIS — M5441 Lumbago with sciatica, right side: Secondary | ICD-10-CM

## 2020-07-14 ENCOUNTER — Other Ambulatory Visit: Payer: Self-pay | Admitting: Family Medicine

## 2020-08-04 ENCOUNTER — Other Ambulatory Visit: Payer: Self-pay

## 2020-08-05 ENCOUNTER — Other Ambulatory Visit: Payer: Self-pay | Admitting: Family Medicine

## 2020-08-09 ENCOUNTER — Ambulatory Visit: Payer: Medicaid Other | Admitting: Pharmacist

## 2020-08-09 DIAGNOSIS — E7800 Pure hypercholesterolemia, unspecified: Secondary | ICD-10-CM

## 2020-08-09 DIAGNOSIS — E1165 Type 2 diabetes mellitus with hyperglycemia: Secondary | ICD-10-CM

## 2020-08-09 DIAGNOSIS — N183 Chronic kidney disease, stage 3 unspecified: Secondary | ICD-10-CM

## 2020-08-09 DIAGNOSIS — I5042 Chronic combined systolic (congestive) and diastolic (congestive) heart failure: Secondary | ICD-10-CM

## 2020-08-09 DIAGNOSIS — IMO0002 Reserved for concepts with insufficient information to code with codable children: Secondary | ICD-10-CM

## 2020-08-09 DIAGNOSIS — I2510 Atherosclerotic heart disease of native coronary artery without angina pectoris: Secondary | ICD-10-CM

## 2020-08-09 DIAGNOSIS — I10 Essential (primary) hypertension: Secondary | ICD-10-CM

## 2020-08-09 NOTE — Patient Instructions (Signed)
Visit Information  Patient Care Plan: Medication Management    Problem Identified: Diabetes, Hypertension, Depression, CHF     Long-Range Goal: Disease Progression Prevention   This Visit's Progress: On track  Recent Progress: On track  Priority: High  Note:   Current Barriers:  . Unable to independently monitor therapeutic efficacy . Unable to achieve control of diabetes  . Low level of healthcare literacy  Pharmacist Clinical Goal(s):  [name] Over the next 90 days, patient will achieve adherence to monitoring guidelines and medication adherence to achieve therapeutic efficacy. . Over the next 90 days, patient will improve control of diabetes as evidenced by A1c.   Interventions: . 1:1 collaboration with [name], MD regarding development and update of comprehensive plan of care as evidenced by provider attestation and co-signature . Inter-disciplinary care team collaboration (see longitudinal plan of care) . Comprehensive medication review performed; medication list updated in electronic medical record  Diabetes: . Uncontrolled; current treatment: metformin XR 500 mg BID, Trulicity 4.5 mg weekly, Jardiance 25 mg daily, Tresiba 45 units daily  . Current glucose readings: has not been checking during the holidays. Ordered CGM. Patient notes a "box of yellow boxes" arrived but he is unsure what it is.  . Denies any s/sx hypoglycemia . Reviewed that this was likely his [place] 2 order. Scheduled RN visit next week for CGM teaching, patient is to bring supplies with him. F/u with me in ~ 3 weeks later face to face scheduled for download and review.  . Recommend to continue current regimen for now.  . Reviewed refill hx. Patient up to date on refills for medications.   Hypertension, CAD, CHF . Controlled; current treatment: carvedilol 12.5 mg BID (though suspect only taking once daily d/t refill history, not filled since 02/2020), furosemide 20 mg every other day, isosorbide 30 mg  daily, lisinopril 2.5 mg daily, potassium 10 mEq every other day.   . Antiplatelet: aspirin 81 mg daily  . Recommend to continue current regimen. Will discuss adherence packaging strategies at upcoming face to face appointments.   Hyperlipidemia: . Very well controlled; current treatment: atorvastatin 80 mg daily, ezetimibe 10 mg daily; suspect non-adherence to atorvastatin 80 mg daily at the time of pre-ezetimibe lipid panel (LDL was 91) as most recent LDL is 29.  [name] Recommended to continue current regimen at this time. Consider d/c ezetimibe moving forward to reduce pill burden with lipid recheck to ensure LDL still <70.   Depression/Anxiety: . Appropriately well controlled; escitalopram 10 mg daily- though suspect not taking due to refill history, not filled since 02/2020 . Recommended to continue current regimen. Will discuss further at face to face visits.   BPH . Appropriately well controlled; tamsulosin 0.4 mg daily . Recommended to continue current regimen  Patient Goals/Self-Care Activities . Over the next 90 days, patient will:  - take medications as prescribed check blood glucose at least TID using CGM, document, and provide at future appointments collaborate with provider on medication access solutions  Follow Up Plan: Face to Face appointment with care management team member scheduled for:  ~ 3 weeks       The patient verbalized understanding of instructions, educational materials, and care plan provided today and declined offer to receive copy of patient instructions, educational materials, and care plan.   Plan: Face to Face appointment with care management team member scheduled for: ~ 3 weeks  Catie Feliz Beam, PharmD, [place], CPP Clinical Pharmacist [employer] at [employer] [phone]

## 2020-08-09 NOTE — Chronic Care Management (AMB) (Signed)
Chronic Care Management   Pharmacy Note  08/09/2020 Name: Danny Lansdowne Mancusi Sr. MRN: [id number] DOB: 1951-07-22  Subjective:  Danny Estimable Rothery Sr. is a 69 y.o. year old male who is a primary care patient of [name], [name], MD. The CCM team was consulted for assistance with chronic disease management and care coordination needs.    Engaged with patient by telephone (scheduled face to face but changed to telephonic due to inclement weather) for follow up visit in response to provider referral for pharmacy case management and/or care coordination services.   Consent to Services:  Patient was given information about Chronic Care Management services, agreed to services, and gave verbal consent prior to initiation of services on 09/17/19. Please see initial visit note for detailed documentation.   Objective:  Lab Results  Component Value Date   CREATININE 1.34 03/29/2020   CREATININE 1.30 01/05/2020   CREATININE 1.67 (H) 12/28/2019    Lab Results  Component Value Date   HGBA1C 9.3 (H) 06/29/2020       Component Value Date/Time   CHOL 86 03/29/2020 1102   TRIG 54.0 03/29/2020 1102   HDL 46.30 03/29/2020 1102   CHOLHDL 2 03/29/2020 1102   VLDL 10.8 03/29/2020 1102   LDLCALC 29 03/29/2020 1102   LDLDIRECT 64.0 03/20/2018 0950     Clinical ASCVD: No  The ASCVD Risk score Mikey Bussing DC Jr., et al., 2013) failed to calculate for the following reasons:   The valid total cholesterol range is 130 to 320 mg/dL     BP Readings from Last 3 Encounters:  06/29/20 130/80  03/29/20 110/80  12/28/19 130/80    Assessment/Interventions: Review of patient past medical history, allergies, medications, health status, including review of consultants reports, laboratory and other test data, was performed as part of comprehensive evaluation and provision of chronic care management services.   SDOH (Social Determinants of Health) assessments and interventions performed:  SDOH Interventions   Flowsheet  Row Most Recent Value  SDOH Interventions   Financial Strain Interventions Intervention Not Indicated  [patient has Medicare/Medicaid DSNP plan]       CCM Care Plan  No Known Allergies  Medications Reviewed Today    Reviewed by De Hollingshead, RPH-CPP (Pharmacist) on 08/09/20 at 1516  Med List Status: <None>  Medication Order Taking? Sig Documenting Provider Last Dose Status Informant  ACCU-CHEK GUIDE test strip [id number]  CHECK CBGS 4 TIMES A DAY [name], MD  Active   acetaminophen (TYLENOL) 325 MG tablet [id number] Yes Take 650 mg by mouth every 6 (six) hours as needed. [provider] Taking Active Self           Med Note Darnelle Maffucci, Maralyn Sago Nov 05, 2019  1:23 PM) Taking 1-2 tablets every 4 hours  aspirin 81 MG tablet [id number] Yes Take 81 mg by mouth daily. [provider] Taking Active Self  atorvastatin (LIPITOR) 80 MG tablet [id number] Yes Take 1 tablet (80 mg total) by mouth daily. [name], MD Taking Active   baclofen (LIORESAL) 10 MG tablet [id number] Yes TAKE 1 TABLET BY MOUTH THREE TIMES A DAY AS NEEDED FOR MUSCLE SPASMS [name], MD Taking Active   blood glucose meter kit and supplies KIT [id number]  Dispense based on patient and insurance preference. Check CBGs 4 times daily. Dx code E11.9. [name], MD  Active   budesonide-formoterol [facility]) 160-4.5 MCG/ACT inhaler [id number] Yes TAKE 2 PUFFS BY MOUTH TWICE A DAY Sonnenberg,  [name], MD Taking Active   carvedilol (COREG) 12.5 MG tablet [id number] Yes TAKE 1 TABLET (12.5 MG TOTAL) BY MOUTH 2 (TWO) TIMES DAILY WITH A MEAL. [name], MD Taking Active   clopidogrel (PLAVIX) 75 MG tablet [id number] Yes TAKE 1 TABLET BY MOUTH EVERY DAY Gollan, Kathlene November, MD Taking Active   Dulaglutide (TRULICITY) 4.5 QI/3.4VQ SOPN [id number] Yes Inject 4.5 mg as directed once a week. [name], MD Taking Active   empagliflozin (JARDIANCE) 25 MG TABS tablet [id number]  Yes Take 25 mg by mouth daily. [name], MD Taking Active   escitalopram (LEXAPRO) 10 MG tablet [id number] No TAKE 1 TABLET BY MOUTH EVERY DAY  Patient not taking: Reported on 08/09/2020   Einar Pheasant, MD Not Taking Active   ezetimibe (ZETIA) 10 MG tablet [id number] Yes TAKE 1 TABLET BY MOUTH EVERY DAY [name], MD Taking Active   furosemide (LASIX) 20 MG tablet [id number] Yes TAKE 1 TABLET (20 MG) BY MOUTH ONCE EVERY OTHER DAY Gollan, Kathlene November, MD Taking Active   Insulin Pen Needle (PEN NEEDLES) 32G X 6 MM MISC [id number] Yes 1 each by Does not apply route 2 (two) times daily. [name], MD Taking Active   isosorbide mononitrate (IMDUR) 30 MG 24 hr tablet [id number] Yes TAKE 1 TABLET BY MOUTH EVERY DAY Gollan, Kathlene November, MD Taking Active   lisinopril (ZESTRIL) 2.5 MG tablet [id number] Yes Take 1 tablet (2.5 mg total) by mouth daily. [name], MD Taking Active   metFORMIN (GLUCOPHAGE-XR) 500 MG 24 hr tablet [id number] Yes TAKE 1 TABLET BY MOUTH TWICE A DAY [name], MD Taking Active   nitroGLYCERIN (NITROSTAT) 0.4 MG SL tablet [id number]  Place 1 tablet (0.4 mg total) under the tongue every 5 (five) minutes as needed for chest pain. [name], MD  Active Self  potassium chloride (MICRO-K) 10 MEQ CR capsule [id number] Yes TAKE 1 CAPSULE (10 MEQ) BY MOUTH ONCE EVERY OTHER DAY WITH LASIX (FUROSEMIDE) Gollan, Kathlene November, MD Taking Active   tamsulosin (FLOMAX) 0.4 MG CAPS capsule [id number] Yes TAKE 1 CAPSULE BY MOUTH EVERY DAY Einar Pheasant, MD Taking Active   TRESIBA FLEXTOUCH 100 UNIT/ML FlexTouch Pen [id number] Yes INJECT (45 UNITS TOTAL) INTO THE SKIN DAILY. [name], MD Taking Active   VENTOLIN HFA 108 [phone] Base) MCG/ACT inhaler [id number] No Inhale 2 puffs into the lungs every 6 (six) hours as needed for wheezing or shortness of breath.  Patient not taking: Reported on 08/09/2020   [name], MD Not Taking Active            Patient Active Problem List   Diagnosis Date Noted  . Low back pain 06/29/2020  . Onychomycosis 03/29/2020  . Fall 03/29/2020  . AKI (acute kidney injury) ([place]) 12/28/2019  . Acute hypoxemic respiratory failure due to COVID-19 ([place]) 07/29/2019  . Coronary artery disease of native artery of native heart with stable angina pectoris ([place]) 12/12/2018  . Pure hypercholesterolemia 12/12/2018  . Chest pain 10/31/2018  . Atherosclerosis of native arteries of extremity with intermittent claudication ([place]) 02/11/2018  . Decreased pedal pulses 07/02/2017  . Orthostasis 05/07/2017  . Anxiety and depression 05/07/2017  . CKD (chronic kidney disease) stage 3, GFR 30-59 ml/min (HCC) 03/06/2017  . Diabetic neuropathy ([place]) 03/06/2017  . Chronic combined systolic and diastolic CHF (congestive heart failure) ([place]) 03/05/2017  . Morbid obesity ([place]) 06/18/2016  . H/O medication noncompliance 06/18/2016  . COPD (chronic  obstructive pulmonary disease) ([place]) 06/20/2015  . GERD (gastroesophageal reflux disease) 12/20/2014  . Diabetes mellitus type 2, uncontrolled, with complications ([place]) 31/51/7616  . Hyperlipidemia 02/10/2010  . Essential hypertension 02/10/2010  . CAD (coronary artery disease) 02/10/2010    Conditions to be addressed/monitored: HTN, HLD and DM  Patient Care Plan: Medication Management    Problem Identified: Diabetes, Hypertension, Depression, CHF     Long-Range Goal: Disease Progression Prevention   This Visit's Progress: On track  Recent Progress: On track  Priority: High  Note:   Current Barriers:  . Unable to independently monitor therapeutic efficacy . Unable to achieve control of diabetes  . Low level of healthcare literacy  Pharmacist Clinical Goal(s):  [name] Over the next 90 days, patient will achieve adherence to monitoring guidelines and medication adherence to achieve therapeutic efficacy. . Over the next 90 days, patient will improve control of diabetes as  evidenced by A1c.   Interventions: . 1:1 collaboration with [name], MD regarding development and update of comprehensive plan of care as evidenced by provider attestation and co-signature . Inter-disciplinary care team collaboration (see longitudinal plan of care) . Comprehensive medication review performed; medication list updated in electronic medical record  Diabetes: . Uncontrolled; current treatment: metformin XR 073 mg BID, Trulicity 4.5 mg weekly, Jardiance 25 mg daily, Tresiba 45 units daily  . Current glucose readings: has not been checking during the holidays. Ordered CGM. Patient notes a "box of yellow boxes" arrived but he is unsure what it is.  . Denies any s/sx hypoglycemia . Reviewed that this was likely his [place] 2 order. Scheduled RN visit next week for CGM teaching, patient is to bring supplies with him. F/u with me in ~ 3 weeks later face to face scheduled for download and review.  . Recommend to continue current regimen for now.  . Reviewed refill hx. Patient up to date on refills for medications.   Hypertension, CAD, CHF . Controlled; current treatment: carvedilol 12.5 mg BID (though suspect only taking once daily d/t refill history, not filled since 02/2020), furosemide 20 mg every other day, isosorbide 30 mg daily, lisinopril 2.5 mg daily, potassium 10 mEq every other day.   . Antiplatelet: aspirin 81 mg daily  . Recommend to continue current regimen. Will discuss adherence packaging strategies at upcoming face to face appointments.   Hyperlipidemia: . Very well controlled; current treatment: atorvastatin 80 mg daily, ezetimibe 10 mg daily; suspect non-adherence to atorvastatin 80 mg daily at the time of pre-ezetimibe lipid panel (LDL was 91) as most recent LDL is 29.  [name] Recommended to continue current regimen at this time. Consider d/c ezetimibe moving forward to reduce pill burden with lipid recheck to ensure LDL still <70.    Depression/Anxiety: . Appropriately well controlled; escitalopram 10 mg daily- though suspect not taking due to refill history, not filled since 02/2020 . Recommended to continue current regimen. Will discuss further at face to face visits.   BPH . Appropriately well controlled; tamsulosin 0.4 mg daily . Recommended to continue current regimen  Patient Goals/Self-Care Activities . Over the next 90 days, patient will:  - take medications as prescribed check blood glucose at least TID using CGM, document, and provide at future appointments collaborate with provider on medication access solutions  Follow Up Plan: Face to Face appointment with care management team member scheduled for:  ~ 3 weeks       Medication Assistance: None required. Patient affirms current coverage meets needs.   Plan:  Face to Face appointment with care management team member scheduled for: ~ 3 weeks  Catie Darnelle Maffucci, PharmD, [place], [place] Clinical Pharmacist [employer] at [employer] [phone]

## 2020-08-16 ENCOUNTER — Ambulatory Visit: Payer: Medicaid Other

## 2020-08-16 ENCOUNTER — Other Ambulatory Visit: Payer: Self-pay | Admitting: Family Medicine

## 2020-09-01 ENCOUNTER — Telehealth: Payer: Self-pay

## 2020-09-01 NOTE — Progress Notes (Signed)
09/01/2020 Have been unable to contact patient to reschedule tomorrows appointment with Pharm D  Catie in office.  Have Called Patient several times today,No answer and  No Voicemail Also called his emergency contacts, no answer and no Voicemail.  Called number for Marcelino Duster states that it is, Lurena Joiner so I did not leave a message   Penne Lash, RMA Care Guide, Embedded Care Coordination [facility]  [place], [place] [address] Direct Dial: [phone] [email or web address] Website: [email or web address]

## 2020-09-02 ENCOUNTER — Ambulatory Visit: Payer: Medicaid Other

## 2020-09-02 NOTE — Chronic Care Management (AMB) (Signed)
  Care Management   Note  09/02/2020 Name: Danny Juday Urista Sr. MRN: [id number] DOB: 1951/06/09  Danny Friends Whitmyer Sr. is a 70 y.o. year old male who is a primary care patient of [name], MD and is actively engaged with the care management team. I reached out to [employer] Sr. by phone today to assist with re-scheduling a follow up visit with the Pharmacist  Follow up plan: Unsuccessful telephone outreach attempt made.The care management team will reach out to the patient again over the next 2 days.  If patient returns call to provider office, please advise to call Embedded Care Management Care Guide Penne Lash  at [phone]  Penne Lash, RMA Care Guide, Embedded Care Coordination [facility]  [place], [place] [address] Direct Dial: [phone] [email or web address]: [email or web address]

## 2020-09-13 ENCOUNTER — Other Ambulatory Visit: Payer: Self-pay | Admitting: Family Medicine

## 2020-09-13 DIAGNOSIS — M5441 Lumbago with sciatica, right side: Secondary | ICD-10-CM

## 2020-09-28 ENCOUNTER — Other Ambulatory Visit: Payer: Self-pay

## 2020-09-30 ENCOUNTER — Ambulatory Visit (INDEPENDENT_AMBULATORY_CARE_PROVIDER_SITE_OTHER): Payer: 59 | Admitting: Pharmacist

## 2020-09-30 ENCOUNTER — Ambulatory Visit (INDEPENDENT_AMBULATORY_CARE_PROVIDER_SITE_OTHER): Payer: 59 | Admitting: Family Medicine

## 2020-09-30 ENCOUNTER — Other Ambulatory Visit: Payer: Self-pay

## 2020-09-30 ENCOUNTER — Encounter: Payer: Self-pay | Admitting: Family Medicine

## 2020-09-30 VITALS — BP 100/60 | HR 90 | Temp 97.6°F | Ht 72.0 in | Wt 228.8 lb

## 2020-09-30 DIAGNOSIS — E118 Type 2 diabetes mellitus with unspecified complications: Secondary | ICD-10-CM | POA: Diagnosis not present

## 2020-09-30 DIAGNOSIS — F32A Depression, unspecified: Secondary | ICD-10-CM

## 2020-09-30 DIAGNOSIS — N183 Chronic kidney disease, stage 3 unspecified: Secondary | ICD-10-CM

## 2020-09-30 DIAGNOSIS — Z23 Encounter for immunization: Secondary | ICD-10-CM | POA: Diagnosis not present

## 2020-09-30 DIAGNOSIS — E1165 Type 2 diabetes mellitus with hyperglycemia: Secondary | ICD-10-CM

## 2020-09-30 DIAGNOSIS — I10 Essential (primary) hypertension: Secondary | ICD-10-CM | POA: Diagnosis not present

## 2020-09-30 DIAGNOSIS — J432 Centrilobular emphysema: Secondary | ICD-10-CM | POA: Diagnosis not present

## 2020-09-30 DIAGNOSIS — F419 Anxiety disorder, unspecified: Secondary | ICD-10-CM

## 2020-09-30 DIAGNOSIS — I5042 Chronic combined systolic (congestive) and diastolic (congestive) heart failure: Secondary | ICD-10-CM

## 2020-09-30 DIAGNOSIS — IMO0002 Reserved for concepts with insufficient information to code with codable children: Secondary | ICD-10-CM

## 2020-09-30 DIAGNOSIS — I2510 Atherosclerotic heart disease of native coronary artery without angina pectoris: Secondary | ICD-10-CM

## 2020-09-30 DIAGNOSIS — E7800 Pure hypercholesterolemia, unspecified: Secondary | ICD-10-CM | POA: Diagnosis not present

## 2020-09-30 LAB — BASIC METABOLIC PANEL
BUN: 29 mg/dL — ABNORMAL HIGH (ref 6–23)
CO2: 27 mEq/L (ref 19–32)
Calcium: 9.4 mg/dL (ref 8.4–10.5)
Chloride: 104 mEq/L (ref 96–112)
Creatinine, Ser: 1.32 mg/dL (ref 0.40–1.50)
GFR: 54.97 mL/min — ABNORMAL LOW (ref >60.00)
Glucose, Bld: 80 mg/dL (ref 70–99)
Potassium: 4.2 mEq/L (ref 3.5–5.1)
Sodium: 138 mEq/L (ref 135–145)

## 2020-09-30 LAB — HEMOGLOBIN A1C: Hgb A1c MFr Bld: 8.4 % — ABNORMAL HIGH (ref 4.6–6.5)

## 2020-09-30 MED ORDER — BUDESONIDE-FORMOTEROL FUMARATE 160-4.5 MCG/ACT IN AERO: INHALATION_SPRAY | RESPIRATORY_TRACT | 3 refills | Status: DC

## 2020-09-30 MED ORDER — ALBUTEROL SULFATE (2.5 MG/3ML) 0.083% IN NEBU: 2.5000 mg | INHALATION_SOLUTION | Freq: Four times a day (QID) | RESPIRATORY_TRACT | 1 refills | Status: AC | PRN

## 2020-09-30 NOTE — Assessment & Plan Note (Signed)
Adequate control.  He will monitor for any excessive lightheadedness and let us know if this occurs.  He will continue carvedilol 12.5 mg twice daily, Lasix 20 mg daily, Imdur 30 mg daily, and lisinopril 2.5 mg daily.  BMP today.

## 2020-09-30 NOTE — Assessment & Plan Note (Addendum)
Check A1c.  He will continue Jardiance 25 mg daily, Metformin 500 mg twice daily, Trulicity 4.5 mg weekly, and Tresiba 45 units once daily.  He was given a freestyle libre by our clinical pharmacist.  He was advised to check his blood glucose at least 4 times a day.

## 2020-09-30 NOTE — Progress Notes (Signed)
Tommi Rumps, MD Phone: [phone]  Cristopher Estimable Bench Sr. is a 70 y.o. male who presents today for f/u.  HYPERTENSION  Disease Monitoring  Home BP Monitoring not checking Chest pain- no    Dyspnea- no change to chronic dyspnea on exertion Medications  Compliance-  Taking lasix, imdur, coreg, lisinopril. Lightheadedness-only if he gets up very quickly out of bed.  He started rising slowly and does not have an issue with this now edema-no  DIABETES Disease Monitoring: Blood Sugar ranges-not checking polyuria/phagia/dipsia-no      Optho-due Medications: Compliance-taking Jardiance, Trulicity, Metformin, and Tresiba 45 units daily, he notes he was taking an extra 30 units of Tresiba in the evening on occasion hypoglycemic symptoms-rare,  Anxiety/depression: Notes mild depression off and on if he thinks about certain things.  No SI.  He notes he is still on Lexapro.  No anxiety.  COPD: Continues with Symbicort.  Notes he needs a refill of the albuterol nebulizer solution.  He notes dyspnea only if he really exerts himself.  This is stable.  No significant cough or wheezing.   Social History   Tobacco Use  Smoking Status Former Smoker  . Packs/day: 3.00  . Years: 0.00  . Pack years: 0.00  . Types: Cigarettes  . Quit date: 09/07/2009  . Years since quitting: 11.0  Smokeless Tobacco Never Used  Tobacco Comment   quit june 2011    Current Outpatient Medications on File Prior to Visit  Medication Sig Dispense Refill  . ACCU-CHEK GUIDE test strip CHECK CBGS 4 TIMES A DAY 300 strip 1  . acetaminophen (TYLENOL) 325 MG tablet Take 650 mg by mouth every 6 (six) hours as needed.    [name] aspirin 81 MG tablet Take 81 mg by mouth daily.    [name] atorvastatin (LIPITOR) 80 MG tablet Take 1 tablet (80 mg total) by mouth daily. 90 tablet 3  . baclofen (LIORESAL) 10 MG tablet TAKE 1 TABLET BY MOUTH THREE TIMES A DAY AS NEEDED FOR MUSCLE SPASMS 30 tablet 0  . blood glucose meter kit and supplies KIT  Dispense based on patient and insurance preference. Check CBGs 4 times daily. Dx code E11.9. 1 each 0  . carvedilol (COREG) 12.5 MG tablet TAKE 1 TABLET (12.5 MG TOTAL) BY MOUTH 2 (TWO) TIMES DAILY WITH A MEAL. 180 tablet 3  . clopidogrel (PLAVIX) 75 MG tablet TAKE 1 TABLET BY MOUTH EVERY DAY 90 tablet 3  . Dulaglutide (TRULICITY) 4.5 DX/8.3JA SOPN Inject 4.5 mg as directed once a week. 6 mL 3  . empagliflozin (JARDIANCE) 25 MG TABS tablet Take 25 mg by mouth daily. 90 tablet 3  . escitalopram (LEXAPRO) 10 MG tablet TAKE 1 TABLET BY MOUTH EVERY DAY 90 tablet 1  . ezetimibe (ZETIA) 10 MG tablet TAKE 1 TABLET BY MOUTH EVERY DAY 90 tablet 3  . furosemide (LASIX) 20 MG tablet TAKE 1 TABLET (20 MG) BY MOUTH ONCE EVERY OTHER DAY 45 tablet 3  . Insulin Pen Needle (PEN NEEDLES) 32G X 6 MM MISC 1 each by Does not apply route 2 (two) times daily. 100 each 11  . isosorbide mononitrate (IMDUR) 30 MG 24 hr tablet TAKE 1 TABLET BY MOUTH EVERY DAY 90 tablet 3  . lisinopril (ZESTRIL) 2.5 MG tablet Take 1 tablet (2.5 mg total) by mouth daily. 90 tablet 3  . metFORMIN (GLUCOPHAGE-XR) 500 MG 24 hr tablet TAKE 1 TABLET BY MOUTH TWICE A DAY 180 tablet 3  . nitroGLYCERIN (NITROSTAT) 0.4 MG SL  tablet Place 1 tablet (0.4 mg total) under the tongue every 5 (five) minutes as needed for chest pain. 25 tablet 3  . potassium chloride (MICRO-K) 10 MEQ CR capsule TAKE 1 CAPSULE (10 MEQ) BY MOUTH ONCE EVERY OTHER DAY WITH LASIX (FUROSEMIDE) 45 capsule 3  . tamsulosin (FLOMAX) 0.4 MG CAPS capsule TAKE 1 CAPSULE BY MOUTH EVERY DAY 90 capsule 0  . TRESIBA FLEXTOUCH 100 UNIT/ML FlexTouch Pen INJECT (45 UNITS TOTAL) INTO THE SKIN DAILY. 135 mL 1  . VENTOLIN HFA 108 (90 Base) MCG/ACT inhaler Inhale 2 puffs into the lungs every 6 (six) hours as needed for wheezing or shortness of breath. 18 g 3   No current facility-administered medications on file prior to visit.     ROS see history of present illness  Objective  Physical  Exam Vitals:   09/30/20 1107  BP: 100/60  Pulse: 90  Temp: 97.6 F (36.4 C)  SpO2: 97%    BP Readings from Last 3 Encounters:  09/30/20 100/60  06/29/20 130/80  03/29/20 110/80   Wt Readings from Last 3 Encounters:  09/30/20 228 lb 12.8 oz (103.8 kg)  06/29/20 229 lb 6.4 oz (104.1 kg)  03/29/20 238 lb 3.2 oz (108 kg)    Physical Exam Constitutional:      General: He is not in acute distress.    Appearance: He is not diaphoretic.  Cardiovascular:     Rate and Rhythm: Normal rate and regular rhythm.     Heart sounds: Normal heart sounds.  Pulmonary:     Effort: Pulmonary effort is normal.     Breath sounds: Normal breath sounds.  Musculoskeletal:        General: No edema.     Right lower leg: No edema.     Left lower leg: No edema.  Skin:    General: Skin is warm and dry.  Neurological:     Mental Status: He is alert.      Assessment/Plan: Please see individual problem list.  Problem List Items Addressed This Visit    Anxiety and depression    Stable.  He will continue Lexapro 10 mg once daily.      COPD (chronic obstructive pulmonary disease) (HCC)    Chronic.  Stable.  Refills given.  He will continue Symbicort 2 puffs twice daily and albuterol as needed.      Relevant Medications   budesonide-formoterol (SYMBICORT) 160-4.5 MCG/ACT inhaler   albuterol (PROVENTIL) (2.5 MG/3ML) 0.083% nebulizer solution   Diabetes mellitus type 2, uncontrolled, with complications (HCC)    Check A1c.  He will continue Jardiance 25 mg daily, Metformin 500 mg twice daily, Trulicity 4.5 mg weekly, and Tresiba 45 units once daily.  He was given a freestyle libre by our clinical pharmacist.  He was advised to check his blood glucose at least 4 times a day.      Essential hypertension    Adequate control.  He will monitor for any excessive lightheadedness and let us know if this occurs.  He will continue carvedilol 12.5 mg twice daily, Lasix 20 mg daily, Imdur 30 mg daily, and  lisinopril 2.5 mg daily.  BMP today.           This visit occurred during the SARS-CoV-2 public health emergency.  Safety protocols were in place, including screening questions prior to the visit, additional usage of staff PPE, and extensive cleaning of exam room while observing appropriate contact time as indicated for disinfecting solutions.    Randall Hiss  [name], MD [place]

## 2020-09-30 NOTE — Addendum Note (Signed)
Addended by: Lourena Simmonds on: 09/30/2020 12:47 PM   Modules accepted: Orders

## 2020-09-30 NOTE — Patient Instructions (Addendum)
Danny [name],  It was great to see you today!  We put on your first FreeStyle [place] 2 sensor. This lasts for 14 days. When it finishes, take it off and place a new once.   Scan the sensor at least every 8 hours. You can scan more than this if you want.   Take your medications as below:   Morning:  - metformin XR 500 mg - carvedilol 12.5 mg - clopidogrel 75 mg - Jardiance 25 mg - escitalopram 10 mg - ezetimibe 10 mg,  - isosorbide 30 mg  - furosemide 20 mg EVERY OTHER DAY - potassium 10 mEq EVERY OTHER DAY  Evening:  - metformin XR 500 mg - carvedilol 12.5 mg - lisinopril 2.5 mg - atorvastatin 80 mg - ezetimibe 10 mg - tamsulosin 0.4 mg   Keep taking Trulicity 4.5 mg weekly. Increase Tresiba to    Units daily   Call me with questions!  Catie Feliz Beam, PharmD [phone]  Visit Information  PATIENT GOALS: Goals Addressed              This Visit's Progress     Patient Stated   .  Medication Monitoring (pt-stated)        Patient Goals/Self-Care Activities . Over the next 90 days, patient will:  - take medications as prescribed check blood glucose at least TID using CGM, document, and provide at future appointments collaborate with provider on medication access solutions        Print copy of patient instructions, educational materials, and care plan provided in person.  Plan: Face to Face appointment with care management team member scheduled for: ~ 3 weeks  Catie Feliz Beam, PharmD, [place], CPP Clinical Pharmacist [employer] at [employer] [phone]

## 2020-09-30 NOTE — Assessment & Plan Note (Signed)
Stable.  He will continue Lexapro 10 mg once daily.

## 2020-09-30 NOTE — Assessment & Plan Note (Signed)
Chronic.  Stable.  Refills given.  He will continue Symbicort 2 puffs twice daily and albuterol as needed.

## 2020-09-30 NOTE — Patient Instructions (Signed)
Nice to see. We will check lab work today. I refilled your Symbicort and albuterol nebulizer solution.

## 2020-09-30 NOTE — Progress Notes (Signed)
Chronic Care Management Pharmacy Note  09/30/2020 Name:  Danny Duque Timpone Sr. MRN:  [id number] DOB:  April 21, 1951  Subjective: Danny Estimable Vanleer Sr. is an 70 y.o. year old male who is a primary patient of [name], [name], MD.  The CCM team was consulted for assistance with disease management and care coordination needs.    Engaged with patient by telephone for follow up visit in response to provider referral for pharmacy case management and/or care coordination services.   Consent to Services:  The patient was given information about Chronic Care Management services, agreed to services, and gave verbal consent prior to initiation of services.  Please see initial visit note for detailed documentation.   Patient Care Team: [name], MD as PCP - General (Family Medicine) Rockey Situ Kathlene November, MD as Consulting Physician (Cardiology) De Hollingshead, RPH-CPP as Pharmacist (Pharmacist)  Recent office visits: None since our last appointment  Recent consult visits: None since our last appointment  Hospital visits: None in previous 6 months  Objective:  Lab Results  Component Value Date   CREATININE 1.34 03/29/2020   BUN 23 03/29/2020   GFR 52.83 (L) 03/29/2020   GFRNONAA 32 (L) 12/24/2019   GFRAA 37 (L) 12/24/2019   NA 139 03/29/2020   K 4.4 03/29/2020   CALCIUM 9.5 03/29/2020   CO2 28 03/29/2020    Lab Results  Component Value Date/Time   HGBA1C 9.3 (H) 06/29/2020 09:55 AM   HGBA1C 9.2 (H) 03/29/2020 11:02 AM   GFR 52.83 (L) 03/29/2020 11:02 AM   GFR 54.74 (L) 01/05/2020 11:18 AM    Last diabetic Eye exam:  Lab Results  Component Value Date/Time   HMDIABEYEEXA No Retinopathy 04/25/2018 12:56 PM    Last diabetic Foot exam: No results found for: HMDIABFOOTEX   Lab Results  Component Value Date   CHOL 86 03/29/2020   HDL 46.30 03/29/2020   LDLCALC 29 03/29/2020   LDLDIRECT 64.0 03/20/2018   TRIG 54.0 03/29/2020   CHOLHDL 2 03/29/2020    Hepatic  Function Latest Ref Rng & Units 11/05/2019 08/02/2019 08/01/2019  Total Protein 6.5 - 8.1 g/dL 7.7 6.2(L) 6.2(L)  Albumin 3.5 - 5.0 g/dL 4.1 3.4(L) 3.2(L)  AST 15 - 41 U/L 29 34 33  ALT 0 - 44 U/L 41 30 26  Alk Phosphatase 38 - 126 U/L 113 64 68  Total Bilirubin 0.3 - 1.2 mg/dL 0.7 0.5 0.6    CBC Latest Ref Rng & Units 12/24/2019 12/22/2019 11/05/2019  WBC 4.0 - 10.5 K/uL 8.0 10.3 8.5  Hemoglobin 13.0 - 17.0 g/dL 15.9 17.3(H) 17.6(H)  Hematocrit 39.0 - 52.0 % 48.8 51.4 53.1(H)  Platelets 150 - 400 K/uL 249 260 280    Depression screen [facility] 2/9 09/30/2020 06/29/2020 03/29/2020  Decreased Interest 0 0 0  Down, Depressed, Hopeless 0 0 0  PHQ - 2 Score 0 0 0    Social History   Tobacco Use  Smoking Status Former Smoker  . Packs/day: 3.00  . Years: 0.00  . Pack years: 0.00  . Types: Cigarettes  . Quit date: 09/07/2009  . Years since quitting: 11.0  Smokeless Tobacco Never Used  Tobacco Comment   quit june 2011   BP Readings from Last 3 Encounters:  09/30/20 100/60  06/29/20 130/80  03/29/20 110/80   Pulse Readings from Last 3 Encounters:  09/30/20 90  06/29/20 (!) 105  03/29/20 90   Wt Readings from Last 3 Encounters:  09/30/20 228 lb 12.8 oz (  103.8 kg)  06/29/20 229 lb 6.4 oz (104.1 kg)  03/29/20 238 lb 3.2 oz (108 kg)    Assessment/Interventions: Review of patient past medical history, allergies, medications, health status, including review of consultants reports, laboratory and other test data, was performed as part of comprehensive evaluation and provision of chronic care management services.   SDOH:  (Social Determinants of Health) assessments and interventions performed: Yes SDOH Interventions   Flowsheet Row Most Recent Value  SDOH Interventions   Financial Strain Interventions Intervention Not Indicated      CCM Care Plan  No Known Allergies  Medications Reviewed Today    Reviewed by Gordy Councilman, CMA (Certified Medical Assistant) on 09/30/20 at 1108   Med List Status: <None>  Medication Order Taking? Sig Documenting Provider Last Dose Status Informant  ACCU-CHEK GUIDE test strip [id number] Yes CHECK CBGS 4 TIMES A DAY [name], MD Taking Active   acetaminophen (TYLENOL) 325 MG tablet [id number] Yes Take 650 mg by mouth every 6 (six) hours as needed. [provider] Taking Active            Med Note Darnelle Maffucci, Arville Lime   Fri Sep 30, 2020 10:30 AM)    aspirin 81 MG tablet [id number] Yes Take 81 mg by mouth daily. [provider] Taking Active Self  atorvastatin (LIPITOR) 80 MG tablet [id number] Yes Take 1 tablet (80 mg total) by mouth daily. [name], MD Taking Active   baclofen (LIORESAL) 10 MG tablet [id number] Yes TAKE 1 TABLET BY MOUTH THREE TIMES A DAY AS NEEDED FOR MUSCLE SPASMS [name], MD Taking Active   blood glucose meter kit and supplies KIT [id number] Yes Dispense based on patient and insurance preference. Check CBGs 4 times daily. Dx code E11.9. [name], MD Taking Active   budesonide-formoterol [facility]) 160-4.5 MCG/ACT inhaler [id number] Yes TAKE 2 PUFFS BY MOUTH TWICE A DAY [name], MD Taking Active   carvedilol (COREG) 12.5 MG tablet [id number] Yes TAKE 1 TABLET (12.5 MG TOTAL) BY MOUTH 2 (TWO) TIMES DAILY WITH A MEAL. [name], MD Taking Active   clopidogrel (PLAVIX) 75 MG tablet [id number] Yes TAKE 1 TABLET BY MOUTH EVERY DAY Gollan, Kathlene November, MD Taking Active   Dulaglutide (TRULICITY) 4.5 IW/5.8KD SOPN [id number] Yes Inject 4.5 mg as directed once a week. [name], MD Taking Active   empagliflozin (JARDIANCE) 25 MG TABS tablet [id number] Yes Take 25 mg by mouth daily. [name], MD Taking Active   escitalopram (LEXAPRO) 10 MG tablet [id number] Yes TAKE 1 TABLET BY MOUTH EVERY DAY Einar Pheasant, MD Taking Active   ezetimibe (ZETIA) 10 MG tablet [id number] Yes TAKE 1 TABLET BY MOUTH EVERY DAY [name], MD Taking Active   furosemide  (LASIX) 20 MG tablet [id number] Yes TAKE 1 TABLET (20 MG) BY MOUTH ONCE EVERY OTHER DAY Gollan, Kathlene November, MD Taking Active   Insulin Pen Needle (PEN NEEDLES) 32G X 6 MM MISC [id number] Yes 1 each by Does not apply route 2 (two) times daily. [name], MD Taking Active   isosorbide mononitrate (IMDUR) 30 MG 24 hr tablet [id number] Yes TAKE 1 TABLET BY MOUTH EVERY DAY Gollan, Kathlene November, MD Taking Active   lisinopril (ZESTRIL) 2.5 MG tablet [id number] Yes Take 1 tablet (2.5 mg total) by mouth daily. [name], MD Taking Active   metFORMIN (GLUCOPHAGE-XR) 500 MG 24 hr tablet [id number] Yes TAKE 1 TABLET BY MOUTH TWICE A DAY  [name], MD Taking Active   nitroGLYCERIN (NITROSTAT) 0.4 MG SL tablet [id number] Yes Place 1 tablet (0.4 mg total) under the tongue every 5 (five) minutes as needed for chest pain. [name], MD Taking Active Self  potassium chloride (MICRO-K) 10 MEQ CR capsule [id number] Yes TAKE 1 CAPSULE (10 MEQ) BY MOUTH ONCE EVERY OTHER DAY WITH LASIX (FUROSEMIDE) Gollan, Kathlene November, MD Taking Active   tamsulosin (FLOMAX) 0.4 MG CAPS capsule [id number] Yes TAKE 1 CAPSULE BY MOUTH EVERY DAY Einar Pheasant, MD Taking Active   TRESIBA FLEXTOUCH 100 UNIT/ML FlexTouch Pen [id number] Yes INJECT (45 UNITS TOTAL) INTO THE SKIN DAILY. [name], MD Taking Active   VENTOLIN HFA 108 [phone] Base) MCG/ACT inhaler [id number] Yes Inhale 2 puffs into the lungs every 6 (six) hours as needed for wheezing or shortness of breath. [name], MD Taking Active           Patient Active Problem List   Diagnosis Date Noted  . Low back pain 06/29/2020  . Onychomycosis 03/29/2020  . Fall 03/29/2020  . AKI (acute kidney injury) ([place]) 12/28/2019  . Coronary artery disease of native artery of native heart with stable angina pectoris ([place]) 12/12/2018  . Pure hypercholesterolemia 12/12/2018  . Chest pain 10/31/2018  . Atherosclerosis of native arteries of extremity with  intermittent claudication ([place]) 02/11/2018  . Decreased pedal pulses 07/02/2017  . Orthostasis 05/07/2017  . Anxiety and depression 05/07/2017  . CKD (chronic kidney disease) stage 3, GFR 30-59 ml/min (HCC) 03/06/2017  . Diabetic neuropathy ([place]) 03/06/2017  . Chronic combined systolic and diastolic CHF (congestive heart failure) ([place]) 03/05/2017  . Morbid obesity ([place]) 06/18/2016  . H/O medication noncompliance 06/18/2016  . COPD (chronic obstructive pulmonary disease) ([place]) 06/20/2015  . GERD (gastroesophageal reflux disease) 12/20/2014  . Diabetes mellitus type 2, uncontrolled, with complications ([place]) 24/23/5361  . Hyperlipidemia 02/10/2010  . Essential hypertension 02/10/2010  . CAD (coronary artery disease) 02/10/2010    Immunization History  Administered Date(s) Administered  . Fluad Quad(high Dose 65+) 08/02/2019, 09/30/2020  . Influenza, High Dose Seasonal PF 09/01/2018  . Moderna Sars-Covid-2 Vaccination 04/07/2020, 05/09/2020  . Pneumococcal Polysaccharide-23 08/02/2019    Conditions to be addressed/monitored:  Hypertension, Hyperlipidemia, Diabetes and Depression  Care Plan : Medication Management  Updates made by De Hollingshead, RPH-CPP since 09/30/2020 12:00 AM    Problem: Diabetes, Hypertension, Depression, CHF     Long-Range Goal: Disease Progression Prevention   Recent Progress: On track  Priority: High  Note:   Current Barriers:  . Unable to independently monitor therapeutic efficacy . Unable to achieve control of diabetes  . Low level of healthcare literacy  Pharmacist Clinical Goal(s):  [name] Over the next 90 days, patient will achieve adherence to monitoring guidelines and medication adherence to achieve therapeutic efficacy. . Over the next 90 days, patient will improve control of diabetes as evidenced by A1c.   Interventions: . 1:1 collaboration with [name], MD regarding development and update of comprehensive plan of care as evidenced  by provider attestation and co-signature . Inter-disciplinary care team collaboration (see longitudinal plan of care) . Comprehensive medication review performed; medication list updated in electronic medical record  SDOH: . Per check in staff, patient's [facility] Medicare Plan is not showing as active in the system. Patient would benefit from support in troubleshooting this. Will place Care Guide referral to see if they can help with connection with benefits.   Diabetes: . Uncontrolled; current treatment: metformin  XR 034 mg BID, Trulicity 4.5 mg weekly, Jardiance 25 mg daily, Tresiba 45 units daily  . Current glucose readings: has not been checking. Did not show for nurse visit for CGM education.  . Reports vision changes. Needs to schedule w/ ophthalmologist.  . Denies any s/sx hypoglycemia . Educated on [place] 2 CGM. Reviewed need to scan at least 3 times daily. Patient placed first sensor.  . A1c pending.  Hypertension, CAD, CHF . Controlled; current treatment: carvedilol 12.5 mg BID - reports only taking once daily, furosemide 20 mg every other day, isosorbide 30 mg daily, lisinopril 2.5 mg daily, potassium 10 mEq every other day.   . Antiplatelet: aspirin 81 mg daily  . Recommend to continue current regimen.  . Reviewed need to take carvedilol BID.  [name] Provided list of how to fill pill box. Will review adherence packaging options moving forward.   Hyperlipidemia: . Very well controlled; current treatment: atorvastatin 80 mg daily, ezetimibe 10 mg daily; suspect non-adherence to atorvastatin 80 mg daily at the time of pre-ezetimibe lipid panel (LDL was 91) as most recent LDL is 29.  [name] Recommended to continue current regimen at this time.  . Consider d/c ezetimibe moving forward to reduce pill burden with lipid recheck to ensure LDL still <70.   Depression/Anxiety: . Appropriately well controlled; escitalopram 10 mg daily (reviewed refill history, recently filled) . Denies mood concerns  today.  . Recommended to continue current regimen.  BPH . Appropriately well controlled per patient symptom report; tamsulosin 0.4 mg daily . Recommended to continue current regimen  SOB: . Uncontrolled per patient symptom report; taking Symbicort 80/4.5 mcg 2 puffs PRN up to BID.  [name] Requests script for 90 day supply. Sent to pharmacy by PCP.  [name] Continue current regimen at this time  Patient Goals/Self-Care Activities . Over the next 90 days, patient will:  - take medications as prescribed check blood glucose at least three times daily using CGM, document, and provide at future appointments collaborate with provider on medication access solutions  Follow Up Plan: Face to Face appointment with care management team member scheduled for:  ~ 3 weeks        Medication Assistance: None required.  Patient affirms current coverage meets needs.  Patient's preferred pharmacy is:  [id number] [place] [place] [id number]: [phone]: [phone] Follow Up Patient Decision:  Patient agrees to Care Plan and Follow-up.  Plan: Face to Face appointment with care management team member scheduled for: ~ 3 weeks  Catie TDarnelle Maffucci PharmD, BOgdens[place] CErieClinical Pharmacist LOccidental Petroleumat BJohnson [employer] [phone]

## 2020-10-05 ENCOUNTER — Other Ambulatory Visit: Payer: Self-pay

## 2020-10-05 ENCOUNTER — Emergency Department
Admission: EM | Admit: 2020-10-05 | Discharge: 2020-10-05 | Disposition: A | Discharge status: Home / Self Care | Payer: 59 | Attending: Emergency Medicine | Admitting: Emergency Medicine

## 2020-10-05 DIAGNOSIS — I130 Hypertensive heart and chronic kidney disease with heart failure and stage 1 through stage 4 chronic kidney disease, or unspecified chronic kidney disease: Secondary | ICD-10-CM | POA: Diagnosis not present

## 2020-10-05 DIAGNOSIS — Z87891 Personal history of nicotine dependence: Secondary | ICD-10-CM | POA: Diagnosis not present

## 2020-10-05 DIAGNOSIS — Z7951 Long term (current) use of inhaled steroids: Secondary | ICD-10-CM | POA: Diagnosis not present

## 2020-10-05 DIAGNOSIS — Z7984 Long term (current) use of oral hypoglycemic drugs: Secondary | ICD-10-CM | POA: Insufficient documentation

## 2020-10-05 DIAGNOSIS — S91113A Laceration without foreign body of unspecified great toe without damage to nail, initial encounter: Secondary | ICD-10-CM | POA: Diagnosis present

## 2020-10-05 DIAGNOSIS — Z794 Long term (current) use of insulin: Secondary | ICD-10-CM | POA: Diagnosis not present

## 2020-10-05 DIAGNOSIS — Z7982 Long term (current) use of aspirin: Secondary | ICD-10-CM | POA: Insufficient documentation

## 2020-10-05 DIAGNOSIS — I5043 Acute on chronic combined systolic (congestive) and diastolic (congestive) heart failure: Secondary | ICD-10-CM | POA: Diagnosis not present

## 2020-10-05 DIAGNOSIS — I25119 Atherosclerotic heart disease of native coronary artery with unspecified angina pectoris: Secondary | ICD-10-CM | POA: Diagnosis not present

## 2020-10-05 DIAGNOSIS — J449 Chronic obstructive pulmonary disease, unspecified: Secondary | ICD-10-CM | POA: Insufficient documentation

## 2020-10-05 DIAGNOSIS — E1122 Type 2 diabetes mellitus with diabetic chronic kidney disease: Secondary | ICD-10-CM | POA: Insufficient documentation

## 2020-10-05 DIAGNOSIS — E1140 Type 2 diabetes mellitus with diabetic neuropathy, unspecified: Secondary | ICD-10-CM | POA: Insufficient documentation

## 2020-10-05 DIAGNOSIS — S90422A Blister (nonthermal), left great toe, initial encounter: Secondary | ICD-10-CM | POA: Diagnosis not present

## 2020-10-05 DIAGNOSIS — N183 Chronic kidney disease, stage 3 unspecified: Secondary | ICD-10-CM | POA: Diagnosis not present

## 2020-10-05 DIAGNOSIS — Z7902 Long term (current) use of antithrombotics/antiplatelets: Secondary | ICD-10-CM | POA: Diagnosis not present

## 2020-10-05 DIAGNOSIS — W268XXA Contact with other sharp object(s), not elsewhere classified, initial encounter: Secondary | ICD-10-CM | POA: Insufficient documentation

## 2020-10-05 MED ORDER — CEPHALEXIN 500 MG PO CAPS
500.0000 mg | ORAL_CAPSULE | Freq: Once | ORAL | Status: AC
  Administered 2020-10-05: 500 mg via ORAL
  Filled 2020-10-05: qty 1

## 2020-10-05 MED ORDER — CEPHALEXIN 500 MG PO CAPS: 500.0000 mg | ORAL_CAPSULE | Freq: Three times a day (TID) | ORAL | 0 refills | Status: AC

## 2020-10-05 NOTE — ED Triage Notes (Signed)
Pt comes with c/o left big toe skin tear. Pt states he thought it was a piece of paper and pull it off but it was his skin.  Pt has some dried blood present. No active bleeding the patient is diabetic.

## 2020-10-05 NOTE — Discharge Instructions (Addendum)
The wound clean, dry, and covered.  Use the wound supplies given to change the dressing 1 to 2 days as needed.  Follow with Dr. [name] for ongoing symptoms peer return to the ED if necessary.

## 2020-10-05 NOTE — ED Notes (Signed)
Pt thought there was a piece of paper on his left big toe, and it was actually a piece of skin. Skin tear on the left toe.   Pt is on blood thinners. Skin tear is not actively bleeding at this time.

## 2020-10-05 NOTE — ED Provider Notes (Signed)
[facility] Emergency Department Provider Note ____________________________________________  Time seen: 61  I have reviewed the triage vital signs and the nursing notes.  HISTORY  Chief Complaint  Toe Pain  HPI Danny FELICETTI Sr. is a 69 y.o. male with the below medical history, presents himself to the ED for evaluation of a wound.  Patient reports  a skin tear to the left great toe.  He describes apparently thinking it was a piece of paper between his toes, and when he pulled on it he realized it was the skin peeling.  He presents now for evaluation of a plantar toe wound with dried blood in place.  He denies any active bleeding, purulence, or extremity pain.  Patient presents given his history of diabetes.  Past Medical History:  Diagnosis Date  . CAD (coronary artery disease)    a. 01/2010 PCI of LAD; b. 09/2014 PCI/DES of mLAD due to ISR. D1 80, D2 80(jailed), LCX 71m; c. 07/2016 NSTEMI/Cath: LM nl, LAD 54m ISR, 40d, D1 90ost, 80p, D2 90ost, RI min irregs, LCX min irregs, OM1 90 small, OM2/3 min irregs, RCA min irregs, RPLB1 90, EF 25-35%.  . Chronic combined systolic and diastolic CHF (congestive heart failure) ([place])    a. 07/2016 Echo: EF 30%, severe septal/anterior HK, Gr1 DD.  [name] CKD (chronic kidney disease), stage III ([place])   . COPD (chronic obstructive pulmonary disease) ([place])   . DM2 (diabetes mellitus, type 2) ([place])   . Erectile dysfunction   . HLD (hyperlipidemia)   . HTN (hypertension)   . Ischemic cardiomyopathy    a. 07/2016 Echo: EF 30% w/ sev septal/ant HK. Gr1 DD.    Patient Active Problem List   Diagnosis Date Noted  . Low back pain 06/29/2020  . Onychomycosis 03/29/2020  . Fall 03/29/2020  . AKI (acute kidney injury) ([place]) 12/28/2019  . Coronary artery disease of native artery of native heart with stable angina pectoris ([place]) 12/12/2018  . Pure hypercholesterolemia 12/12/2018  . Chest pain 10/31/2018  . Atherosclerosis of native  arteries of extremity with intermittent claudication ([place]) 02/11/2018  . Decreased pedal pulses 07/02/2017  . Orthostasis 05/07/2017  . Anxiety and depression 05/07/2017  . CKD (chronic kidney disease) stage 3, GFR 30-59 ml/min (HCC) 03/06/2017  . Diabetic neuropathy ([place]) 03/06/2017  . Chronic combined systolic and diastolic CHF (congestive heart failure) ([place]) 03/05/2017  . Morbid obesity ([place]) 06/18/2016  . H/O medication noncompliance 06/18/2016  . COPD (chronic obstructive pulmonary disease) ([place]) 06/20/2015  . GERD (gastroesophageal reflux disease) 12/20/2014  . Diabetes mellitus type 2, uncontrolled, with complications ([place]) 21/19/4174  . Hyperlipidemia 02/10/2010  . Essential hypertension 02/10/2010  . CAD (coronary artery disease) 02/10/2010    Past Surgical History:  Procedure Laterality Date  . CAD: stent to the LAD    . CARDIAC CATHETERIZATION  10/01/2014  . CARDIAC CATHETERIZATION N/A 07/23/2016   Procedure: Left Heart Cath and Coronary Angiography;  Surgeon: [name], MD;  Location: [place];  Service: Cardiovascular;  Laterality: N/A;  . CORONARY ANGIOPLASTY WITH STENT PLACEMENT  10/01/2014    Prior to Admission medications   Medication Sig Start Date End Date Taking? Authorizing Provider  cephALEXin (KEFLEX) 500 MG capsule Take 1 capsule (500 mg total) by mouth 3 (three) times daily for 7 days. 10/05/20 10/12/20 Yes Elvy Mclarty, Dannielle Karvonen, PA-C  ACCU-CHEK GUIDE test strip CHECK CBGS 4 TIMES A DAY 11/16/19   [name], MD  acetaminophen (TYLENOL) 325  MG tablet Take 650 mg by mouth every 6 (six) hours as needed.    [provider]  albuterol (PROVENTIL) (2.5 MG/3ML) 0.083% nebulizer solution Take 3 mLs (2.5 mg total) by nebulization every 6 (six) hours as needed for wheezing or shortness of breath. 09/30/20   [name], MD  aspirin 81 MG tablet Take 81 mg by mouth daily.    [provider]  atorvastatin (LIPITOR) 80 MG  tablet Take 1 tablet (80 mg total) by mouth daily. 09/17/19   [name], MD  baclofen (LIORESAL) 10 MG tablet TAKE 1 TABLET BY MOUTH THREE TIMES A DAY AS NEEDED FOR MUSCLE SPASMS 09/13/20   [name], MD  blood glucose meter kit and supplies KIT Dispense based on patient and insurance preference. Check CBGs 4 times daily. Dx code E11.9. 09/17/19   [name], MD  budesonide-formoterol [facility] 160-4.5 MCG/ACT inhaler TAKE 2 PUFFS BY MOUTH TWICE A DAY 09/30/20   [name], MD  carvedilol (COREG) 12.5 MG tablet TAKE 1 TABLET (12.5 MG TOTAL) BY MOUTH 2 (TWO) TIMES DAILY WITH A MEAL. 02/29/20   [name], MD  clopidogrel (PLAVIX) 75 MG tablet TAKE 1 TABLET BY MOUTH EVERY DAY 02/09/20   Gollan, Kathlene November, MD  Dulaglutide (TRULICITY) 4.5 JY/7.8GN SOPN Inject 4.5 mg as directed once a week. 07/11/20   [name], MD  empagliflozin (JARDIANCE) 25 MG TABS tablet Take 25 mg by mouth daily. 09/17/19   [name], MD  escitalopram (LEXAPRO) 10 MG tablet TAKE 1 TABLET BY MOUTH EVERY DAY 05/25/20   Einar Pheasant, MD  ezetimibe (ZETIA) 10 MG tablet TAKE 1 TABLET BY MOUTH EVERY DAY 02/29/20   [name], MD  furosemide (LASIX) 20 MG tablet TAKE 1 TABLET (20 MG) BY MOUTH ONCE EVERY OTHER DAY 03/21/20   [name], MD  Insulin Pen Needle (PEN NEEDLES) 32G X 6 MM MISC 1 each by Does not apply route 2 (two) times daily. 11/17/19   [name], MD  isosorbide mononitrate (IMDUR) 30 MG 24 hr tablet TAKE 1 TABLET BY MOUTH EVERY DAY 02/09/20   [name], MD  lisinopril (ZESTRIL) 2.5 MG tablet Take 1 tablet (2.5 mg total) by mouth daily. 03/29/20   [name], MD  metFORMIN (GLUCOPHAGE-XR) 500 MG 24 hr tablet TAKE 1 TABLET BY MOUTH TWICE A DAY 08/08/20   [name], MD  nitroGLYCERIN (NITROSTAT) 0.4 MG SL tablet Place 1 tablet (0.4 mg total) under the tongue every 5 (five) minutes as needed for chest pain. 12/12/18   [name],  MD  potassium chloride (MICRO-K) 10 MEQ CR capsule TAKE 1 CAPSULE (10 MEQ) BY MOUTH ONCE EVERY OTHER DAY WITH LASIX (FUROSEMIDE) 02/26/20   Gollan, Kathlene November, MD  tamsulosin (FLOMAX) 0.4 MG CAPS capsule TAKE 1 CAPSULE BY MOUTH EVERY DAY 07/04/20   Einar Pheasant, MD  TRESIBA FLEXTOUCH 100 UNIT/ML FlexTouch Pen INJECT (45 UNITS TOTAL) INTO THE SKIN DAILY. 06/10/20   [name], MD  VENTOLIN HFA 108 (90 Base) MCG/ACT inhaler Inhale 2 puffs into the lungs every 6 (six) hours as needed for wheezing or shortness of breath. 09/17/19   [name], MD    Allergies Patient has no known allergies.  Family History  Problem Relation Age of Onset  . Heart attack Father        complications  . Cancer Brother     Social History Social  History   Tobacco Use  . Smoking status: Former Smoker    Packs/day: 3.00    Years: 0.00    Pack years: 0.00    Types: Cigarettes    Quit date: 09/07/2009    Years since quitting: 11.0  . Smokeless tobacco: Never Used  . Tobacco comment: quit june 2011  Substance Use Topics  . Alcohol use: Yes    Comment: every other weekend  . Drug use: No    Review of Systems  Constitutional: Negative for fever. Cardiovascular: Negative for chest pain. Respiratory: Negative for shortness of breath. Gastrointestinal: Negative for abdominal pain, vomiting and diarrhea. Genitourinary: Negative for dysuria. Musculoskeletal: Negative for back pain. Skin: Negative for rash. Left Great toe wound Neurological: Negative for headaches, focal weakness or numbness. ____________________________________________  PHYSICAL EXAM:  VITAL SIGNS: ED Triage Vitals [10/05/20 1724]  Enc Vitals Group     BP (!) 143/90     Pulse Rate 95     Resp 18     Temp 98.1 F (36.7 C)     Temp src      SpO2 100 %     Weight      Height      Head Circumference      Peak Flow      Pain Score 2     Pain Loc      Pain Edu?      Excl. in [place]?     Constitutional: Alert and  oriented. Well appearing and in no distress. Head: Normocephalic and atraumatic. Eyes: Conjunctivae are normal. Normal extraocular movements Cardiovascular: Normal rate, regular rhythm. Normal distal pulses and cap refill. Respiratory: Normal respiratory effort.  Musculoskeletal: Nontender with normal range of motion in all extremities.  Neurologic:  Normal gait without ataxia. Normal speech and language. No gross focal neurologic deficits are appreciated. Skin:  Skin is warm, dry and intact. No rash noted. Left great toe with a fresh blister bed to the planter surface of the fat pad. No active bleeding or purulence. No surrounding erythema, induration, or fluctuance. Onycholysis of the great toe nail noted.  Psychiatric: Mood and affect are normal. Patient exhibits appropriate insight and judgment. ___________________________________________  PROCEDURES  Keflex 500 mg PO Wound care  Procedures ____________________________________________  INITIAL IMPRESSION / ASSESSMENT AND PLAN / ED COURSE  The wound is cleansed and dressed with Vaseline and a nonstick dressing.  Patient is discharged wound care supplies.  He will follow-up with his primary provider or local podiatrist for ongoing symptoms.  Is encouraged to keep the wound clean, dry, and covered, also rest with the foot elevated when seated.  He should avoid being barefoot in the home.  He is sent home with a prophylactic course of Keflex for his plantar surface toe blister wound.   Danny Antolin Amesquita Sr. was evaluated in Emergency Department on 10/05/2020 for the symptoms described in the history of present illness. He was evaluated in the context of the global COVID-19 pandemic, which necessitated consideration that the patient might be at risk for infection with the SARS-CoV-2 virus that causes COVID-19. Institutional protocols and algorithms that pertain to the evaluation of patients at risk for COVID-19 are in a state of rapid change based  on information released by regulatory bodies including the CDC and federal and state organizations. These policies and algorithms were followed during the patient's care in the ED. ____________________________________________  FINAL CLINICAL IMPRESSION(S) / ED DIAGNOSES  Final diagnoses:  Blister of left great toe, initial  encounter      Melvenia Needles, PA-C 10/05/20 1904    Blake Divine, MD 10/05/20 2330

## 2020-10-06 ENCOUNTER — Telehealth: Payer: Self-pay

## 2020-10-07 ENCOUNTER — Telehealth: Payer: Self-pay | Admitting: Family Medicine

## 2020-10-07 NOTE — Telephone Encounter (Signed)
   Telephone encounter was:  Unsuccessful.  10/07/2020 Name: Danny Echavarria Clinkscales Sr. MRN: [id number] DOB: [birth year]  Unsuccessful outbound call made today to assist with:  Insurance Assistance  Outreach Attempt:  1st Attempt  Could not leave a message due to voicemail set up. Care Guide will give patient a call within the week.   [facility] Care Guide, Embedded Care Coordination [facility], Care Management Phone: [phone] Email: [email or web address]

## 2020-10-07 NOTE — Telephone Encounter (Signed)
The patient received a temporary supply of this medication, I think you wrote the Rx for a 90 supply and medicare will only pay for a 30 day supply I put the letter in the lab basket for your review. Zeppelin Beckstrand,cma

## 2020-10-12 NOTE — Telephone Encounter (Signed)
Hey Catie, we got a letter noting that his insurance would not cover the Symbicort dose that he is currently on.  They cover Advair, Lavada Mesi, and air duo.  I do not see where he has been on any of these previously.  What would the equivalent dosing of Advair be given his current Symbicort dose?  Thanks.

## 2020-10-12 NOTE — Telephone Encounter (Signed)
Hey! I recommend we do 250/50 mcg BID

## 2020-10-13 ENCOUNTER — Telehealth: Payer: Self-pay | Admitting: Family Medicine

## 2020-10-13 NOTE — Telephone Encounter (Signed)
   Telephone encounter was:  Successful.  10/13/2020 Name: Jahziel Sinn Pociask Sr. MRN: [id number] DOB: 1951/02/14  Gerrit Friends Tosh Sr. is a 70 y.o. year old male who is a primary care patient of [name], Yehuda Mao, MD . The community resource team was consulted for assistance with Insurance Assistance  Care guide performed the following interventions: Discussed resources to assist with insurance assistance. Patient had questions about his [employer] Coverage. Care Guide connected patient with [facility] with 3-way call. Patient was able to speak with representative named Peyton Najjar, who explained what his insurance covers. Representative also explained to patient that he will need to make sure that he finds providers that are in network with [facility]. Mr. Blomquist recently saw a podiatrist that was out of network and had to pay a $60 copay.  Representative gave him information for a podiatrist( Dr. Gwyneth Revels, in [place], [place], [phone]) within network and advised Mr. Draughn to give them a call. Care Guide will mail patient information to have as reference. .  Follow Up Plan:  No further follow up planned at this time. The patient has been provided with needed resources.   [facility] Care Guide, Embedded Care Coordination [facility], Care Management Phone: [phone] Email: [email or web address]

## 2020-10-13 NOTE — Telephone Encounter (Signed)
   Telephone encounter was:  Successful.  10/13/2020 Name: Danny Haggart Tarnowski Sr. MRN: [id number] DOB: 1950/08/27  Danny Friends Thurmond Sr. is a 70 y.o. year old male who is a primary care patient of [name], Yehuda Mao, MD . The community resource team was consulted for assistance with Insurance Assistance  Care guide performed the following interventions: Spoke with patient regarding referral. Patient stated that he will need assistance calling a Podiatrist office to schedule an appoitment. Informed patient that Care Guide will give patient a call on 10/14/20 to call podiatrist office a call on three way to schedule an appointment. .  Follow Up Plan:  No further follow up planned at this time. The patient has been provided with needed resources.  [facility] Care Guide, Embedded Care Coordination [facility], Care Management Phone: [phone] Email: [email or web address]

## 2020-10-14 ENCOUNTER — Telehealth: Payer: Self-pay | Admitting: Family Medicine

## 2020-10-14 NOTE — Telephone Encounter (Signed)
   Telephone encounter was:  Successful.  10/14/2020 Name: Danny Friends Jungman Sr. MRN: [id number] DOB: 31-May-1951  Danny Friends Episcopo Sr. is a 70 y.o. year old male who is a primary care patient of [name], Yehuda Mao, MD . The community resource team was consulted for assistance with Insurance Assistance  Care guide performed the following interventions: Called patient today to assist with scheduling appointmeht with a podiatrist. [facility] on a three way call. Patient was able to speak with representative at podiatrist office and scheduled his appoint for Friday, October 21, 2020 at 9:45 am. Patient stated no addtional needs at this time. .  Follow Up Plan:  No further follow up planned at this time. The patient has been provided with needed resources.  [facility] Care Guide, Embedded Care Coordination [facility], Care Management Phone: [phone] Email: [email or web address]

## 2020-10-15 MED ORDER — FLUTICASONE-SALMETEROL 250-50 MCG/DOSE IN AEPB: 1.0000 | INHALATION_SPRAY | Freq: Two times a day (BID) | RESPIRATORY_TRACT | 3 refills | Status: DC

## 2020-10-15 NOTE — Telephone Encounter (Signed)
Please let the patient know that his insurance is not going to cover the Symbicort.  I sent in a new inhaler called Advair for him to start on.  He should let us know if this is excessively expensive.  Thanks.

## 2020-10-15 NOTE — Addendum Note (Signed)
Addended by: [name] on: 10/15/2020 10:31 AM   Modules accepted: Orders

## 2020-10-17 NOTE — Telephone Encounter (Signed)
I called and informed the patient that the provider sent in a new inhale, Advair for him to try instead of the symbicort because insurance would not pay for it and he understood.  Gotham Raden,cma

## 2020-10-21 ENCOUNTER — Telehealth: Payer: Medicaid Other

## 2020-10-24 ENCOUNTER — Other Ambulatory Visit: Payer: Self-pay | Admitting: Family Medicine

## 2020-10-24 DIAGNOSIS — M5441 Lumbago with sciatica, right side: Secondary | ICD-10-CM

## 2020-10-26 ENCOUNTER — Ambulatory Visit (INDEPENDENT_AMBULATORY_CARE_PROVIDER_SITE_OTHER): Payer: 59 | Admitting: Physician Assistant

## 2020-10-26 ENCOUNTER — Other Ambulatory Visit: Payer: Self-pay

## 2020-10-26 ENCOUNTER — Encounter: Payer: Self-pay | Admitting: Physician Assistant

## 2020-10-26 ENCOUNTER — Other Ambulatory Visit
Admission: RE | Admit: 2020-10-26 | Discharge: 2020-10-26 | Disposition: A | Discharge status: Home / Self Care | Payer: 59 | Source: Ambulatory Visit | Attending: Physician Assistant | Admitting: Physician Assistant

## 2020-10-26 VITALS — BP 142/78 | HR 104 | Ht 72.0 in | Wt 228.0 lb

## 2020-10-26 DIAGNOSIS — N183 Chronic kidney disease, stage 3 unspecified: Secondary | ICD-10-CM | POA: Diagnosis not present

## 2020-10-26 DIAGNOSIS — R0600 Dyspnea, unspecified: Secondary | ICD-10-CM | POA: Insufficient documentation

## 2020-10-26 DIAGNOSIS — I25118 Atherosclerotic heart disease of native coronary artery with other forms of angina pectoris: Secondary | ICD-10-CM

## 2020-10-26 DIAGNOSIS — J432 Centrilobular emphysema: Secondary | ICD-10-CM

## 2020-10-26 DIAGNOSIS — E1165 Type 2 diabetes mellitus with hyperglycemia: Secondary | ICD-10-CM

## 2020-10-26 DIAGNOSIS — E785 Hyperlipidemia, unspecified: Secondary | ICD-10-CM | POA: Diagnosis not present

## 2020-10-26 DIAGNOSIS — I5042 Chronic combined systolic (congestive) and diastolic (congestive) heart failure: Secondary | ICD-10-CM | POA: Diagnosis not present

## 2020-10-26 DIAGNOSIS — IMO0002 Reserved for concepts with insufficient information to code with codable children: Secondary | ICD-10-CM

## 2020-10-26 DIAGNOSIS — E7800 Pure hypercholesterolemia, unspecified: Secondary | ICD-10-CM | POA: Diagnosis present

## 2020-10-26 DIAGNOSIS — E118 Type 2 diabetes mellitus with unspecified complications: Secondary | ICD-10-CM

## 2020-10-26 DIAGNOSIS — I779 Disorder of arteries and arterioles, unspecified: Secondary | ICD-10-CM

## 2020-10-26 DIAGNOSIS — I255 Ischemic cardiomyopathy: Secondary | ICD-10-CM

## 2020-10-26 LAB — CBC
HCT: 48.7 % (ref 39.0–52.0)
Hemoglobin: 15.4 g/dL (ref 13.0–17.0)
MCH: 30 pg (ref 26.0–34.0)
MCHC: 31.6 g/dL (ref 30.0–36.0)
MCV: 94.9 fL (ref 80.0–100.0)
Platelets: 342 10*3/uL (ref 150–400)
RBC: 5.13 MIL/uL (ref 4.22–5.81)
RDW: 12.2 % (ref 11.5–15.5)
WBC: 10.5 10*3/uL (ref 4.0–10.5)
nRBC: 0 % (ref 0.0–0.2)

## 2020-10-26 LAB — HEPATIC FUNCTION PANEL
ALT: 24 U/L (ref 0–44)
AST: 16 U/L (ref 15–41)
Albumin: 3.8 g/dL (ref 3.5–5.0)
Alkaline Phosphatase: 94 U/L (ref 38–126)
Bilirubin, Direct: <0.1 mg/dL (ref 0.0–0.2)
Total Bilirubin: 0.6 mg/dL (ref 0.3–1.2)
Total Protein: 7.3 g/dL (ref 6.5–8.1)

## 2020-10-26 LAB — BRAIN NATRIURETIC PEPTIDE: B Natriuretic Peptide: 94 pg/mL (ref 0.0–100.0)

## 2020-10-26 LAB — LIPID PANEL
Cholesterol: 96 mg/dL (ref 0–200)
HDL: 46 mg/dL (ref >40)
LDL Cholesterol: 38 mg/dL (ref 0–99)
Total CHOL/HDL Ratio: 2.1 RATIO
Triglycerides: 60 mg/dL (ref <150)
VLDL: 12 mg/dL (ref 0–40)

## 2020-10-26 MED ORDER — NITROGLYCERIN 0.4 MG SL SUBL: 0.4000 mg | SUBLINGUAL_TABLET | SUBLINGUAL | 3 refills | Status: DC | PRN

## 2020-10-26 NOTE — Progress Notes (Addendum)
Office Visit    Patient Name: Danny STEEL Sr. Date of Encounter: 10/28/2020  PCP:  Danny Haven, MD   [place]  Cardiologist:  Danny Rogue, MD  Advanced Practice Provider:  No care team member to display Electrophysiologist:  None   Chief Complaint    Chief Complaint  Patient presents with  . Annual Exam    70 year old male with history of CAD s/p LAD stenting, ischemic cardiomyopathy, chronic combined CHF, stage III chronic kidney disease, COPD, remote tobacco use, diabetes, hypertension, hyperlipidemia, erectile dysfunction, obesity, and who presents for 1 year follow-up.  Past Medical History    Past Medical History:  Diagnosis Date  . CAD (coronary artery disease)    a. 01/2010 PCI of LAD; b. 09/2014 PCI/DES of mLAD due to ISR. D1 80, D2 80(jailed), LCX 51m; c. 07/2016 NSTEMI/Cath: LM nl, LAD 50m ISR, 40d, D1 90ost, 80p, D2 90ost, RI min irregs, LCX min irregs, OM1 90 small, OM2/3 min irregs, RCA min irregs, RPLB1 90, EF 25-35%.  . Chronic combined systolic and diastolic CHF (congestive heart failure) ([place])    a. 07/2016 Echo: EF 30%, severe septal/anterior HK, Gr1 DD.  [name] CKD (chronic kidney disease), stage III ([place])   . COPD (chronic obstructive pulmonary disease) ([place])   . DM2 (diabetes mellitus, type 2) ([place])   . Erectile dysfunction   . HLD (hyperlipidemia)   . HTN (hypertension)   . Ischemic cardiomyopathy    a. 07/2016 Echo: EF 30% w/ sev septal/ant HK. Gr1 DD.   Past Surgical History:  Procedure Laterality Date  . CAD: stent to the LAD    . CARDIAC CATHETERIZATION  10/01/2014  . CARDIAC CATHETERIZATION N/A 07/23/2016   Procedure: Left Heart Cath and Coronary Angiography;  Surgeon: Danny Hampshire, MD;  Location: [place] [facility];  Service: Cardiovascular;  Laterality: N/A;  . CORONARY ANGIOPLASTY WITH STENT PLACEMENT  10/01/2014    Allergies  No Known Allergies  History of Present Illness    Danny COLEGROVE Sr.  is a 70 y.o. male with PMH as above.  He has history of CAD, hypertension, hyperlipidemia, ischemic cardiomyopathy, CKD stage III, DM2, and COPD.  He reportedly has a prior history of smoking 4 packs/day.  He quit in 2011.  He has history of CAD s/p LAD stenting (2011) with subsequent repeat stenting 09/2014 in the setting of ISR.  NSTEMI MI 07/2016 s/p 2017 LHC with moderate to severe small vessel disease and patent stent.  Moderately elevated filling pressures were noted.  LVEF 25 to 35% and newly reduced by ventriculography, 30% by echo.  Medical management recommended.  He was subsequently placed on Ranexa that was then titrated and later discontinued.  When seen at follow-up, he was doing well.  He reported chronic DOE, stable for several years.  He understood the interactions between Viagra and nitrate therapy.  It was noted that a follow-up echo will be arranged for candidacy for ICD.  Subsequent echo showed LVEF 55 to 60%.  He tested positive for corona virus 07/2019.  His oxygen saturation reportedly went down to 88% with checks x-ray showing hazy bilateral peripheral opacities.  He was started on remdesivir and steroids.  His mother unfortunately passed away from COVID-58.  He has history of PVD/PAD and previously followed by Dr. Lucky Cowboy but lost to follow-up.   2019 carotid study showed 1 to 39% bilateral R ICA and ICA.  High resistant flow in the left vertebral  artery was noted to suggest possible obstruction.  Elevated velocities noted in the right vertebral.  PFTs performed 10/2017 showed moderate obstructive disease consistent with COPD.  2019 lower extremity arterial study showed mild RLE arterial disease with TBI abnormal.  Left toe brachial index abnormal with ankle-brachial indices normal.  It was noted that waveforms at the ankle suggested some component of arterial occlusive disease.  Recommendation per PCP was to refer to vascular surgery to discuss potential treatments.  Last seen in  the office 10/13/2019 by his primary cardiologist, Dr. Rockey [name].  At that time, he appeared euvolemic on exam.  He reported shortness of breath, suspected as 2/2 COVID-19 and emphysema.  He was on inhaler.  Diabetes control was stressed.  He continued on statin, Zetia, beta-blocker, long-acting nitrate.  It was noted he was not closely monitoring his pressures at home. Did not take his blood pressure medication today  Today, 10/26/2020, he presents to clinic and notes that he is overall doing well from a cardiac standpoint.  No chest pain. He has not needed his nitro and his current bottle is over 1 year in age. He denies any further tachypalpitations.  If walking too far, he does feel as if his breathing is strained, but this is chronic. He is uncertain if changed from previous visits.  He reports feeling short of breath when walking in from the waiting room today.  His breathing usually improves within 5 to 10 minutes. He is not short of breath when sitting still. He wonders how much of his SOB/DOE is 2/2 deconditioning, due to pain while walking / claudication sx. He is going to vascular surgery this week about his lower extremity claudication symptoms.  His legs hurt all the time - both with walking and at rest. Swelling is worse on the left. When called to his attention, he reports a blister to the left toe.  He sometimes he cannot see very well, which he attributes to uncontrolled sugars.  He reports some amaurosis fugax and dizziness with position changes. We reviewed the recommendation to make slow position changes and risk factor modification with cholesterol and glycemic control. He states his last hemoglobin A1c was improved from previous but still very uncontrolled.  Dietary and lifestyle changes were reviewed.  He is not checking his BP at home and today's BP is without taking his medications. On review of PCP BP check, it did seem well controlled at that time. He denies any s/sx of volume overload.  Reviewed proper technique for BP checks and informed him that I will contact social work in order to obtain a BP machine for him.  Salt intake and fluids reviewed with CHF education provided. We discussed also sleep apnea, considering his recommendation on his 2017 cath.   Home Medications    Current Outpatient Medications on File Prior to Visit  Medication Sig Dispense Refill  . ACCU-CHEK GUIDE test strip CHECK CBGS 4 TIMES A DAY 300 strip 1  . albuterol (PROVENTIL) (2.5 MG/3ML) 0.083% nebulizer solution Take 3 mLs (2.5 mg total) by nebulization every 6 (six) hours as needed for wheezing or shortness of breath. 150 mL 1  . amoxicillin-clavulanate (AUGMENTIN) 875-125 MG tablet Take 1 tablet by mouth 2 (two) times daily.    [name] aspirin 81 MG tablet Take 81 mg by mouth daily.    [name] atorvastatin (LIPITOR) 80 MG tablet Take 1 tablet (80 mg total) by mouth daily. 90 tablet 3  . baclofen (LIORESAL) 10 MG tablet TAKE 1  TABLET BY MOUTH THREE TIMES A DAY AS NEEDED FOR MUSCLE SPASMS 30 tablet 0  . blood glucose meter kit and supplies KIT Dispense based on patient and insurance preference. Check CBGs 4 times daily. Dx code E11.9. 1 each 0  . carvedilol (COREG) 12.5 MG tablet TAKE 1 TABLET (12.5 MG TOTAL) BY MOUTH 2 (TWO) TIMES DAILY WITH A MEAL. 180 tablet 3  . clopidogrel (PLAVIX) 75 MG tablet TAKE 1 TABLET BY MOUTH EVERY DAY 90 tablet 3  . Dulaglutide (TRULICITY) 4.5 MG/0.5ML SOPN Inject 4.5 mg as directed once a week. 6 mL 3  . empagliflozin (JARDIANCE) 25 MG TABS tablet Take 25 mg by mouth daily. 90 tablet 3  . escitalopram (LEXAPRO) 10 MG tablet TAKE 1 TABLET BY MOUTH EVERY DAY 90 tablet 1  . ezetimibe (ZETIA) 10 MG tablet TAKE 1 TABLET BY MOUTH EVERY DAY 90 tablet 3  . furosemide (LASIX) 20 MG tablet TAKE 1 TABLET (20 MG) BY MOUTH ONCE EVERY OTHER DAY 45 tablet 3  . Insulin Pen Needle (PEN NEEDLES) 32G X 6 MM MISC 1 each by Does not apply route 2 (two) times daily. 100 each 11  . isosorbide mononitrate  (IMDUR) 30 MG 24 hr tablet TAKE 1 TABLET BY MOUTH EVERY DAY 90 tablet 3  . lisinopril (ZESTRIL) 2.5 MG tablet Take 1 tablet (2.5 mg total) by mouth daily. 90 tablet 3  . metFORMIN (GLUCOPHAGE-XR) 500 MG 24 hr tablet TAKE 1 TABLET BY MOUTH TWICE A DAY 180 tablet 3  . potassium chloride (MICRO-K) 10 MEQ CR capsule TAKE 1 CAPSULE (10 MEQ) BY MOUTH ONCE EVERY OTHER DAY WITH LASIX (FUROSEMIDE) 45 capsule 3  . tamsulosin (FLOMAX) 0.4 MG CAPS capsule TAKE 1 CAPSULE BY MOUTH EVERY DAY 90 capsule 0  . VENTOLIN HFA 108 (90 Base) MCG/ACT inhaler Inhale 2 puffs into the lungs every 6 (six) hours as needed for wheezing or shortness of breath. 18 g 3   No current facility-administered medications on file prior to visit.    Review of Systems    He denies chest pain, palpitations, pnd, orthopnea, n, v, syncope, weight gain, or Danny satiety. He reports constant LE pain with exertion and rest. He has a left foot toe lesion. He notes DOE is chronic though uncertain if increased from previous visits. He has blurry vision. He notes dizziness with changes in position at times. He has edema that was noted on exam but not very concerning to him.  All other systems reviewed and are otherwise negative except as noted above.  Physical Exam    VS:  BP (!) 142/78   Pulse (!) 104   Ht 6' (1.829 m)   Wt 228 lb (103.4 kg)   BMI 30.92 kg/m  , BMI Body mass index is 30.92 kg/m. GEN: Well nourished, well developed, in no acute distress. HEENT: normal. Neck: Supple, no JVD, carotid bruits, or masses. Cardiac: RRR, no murmurs, rubs, or gallops. No clubbing, cyanosis. Bilateral moderate edema with L>R.  Radials 2+ and equal bilaterally. Trace pedal pulses bilaterally.  Respiratory:  Respirations regular and unlabored, bilateral reduced breath sounds  GI: Soft, nontender, nondistended, BS + x 4. MS: no deformity or atrophy. Skin: warm and dry, no rash.  Neuro:  Strength and sensation are intact. Psych: Normal  affect.  Accessory Clinical Findings    ECG personally reviewed by me today - ST, 104bpm, PR interval 150 ms, QRS 96 ms, QTC 439 ms, ST/T changes in inferior lateral leads seen  on prior tracings (10/13/2019)- no acute changes.  VITALS Reviewed today   Temp Readings from Last 3 Encounters:  10/05/20 98.1 F (36.7 C)  09/30/20 97.6 F (36.4 C) (Oral)  06/29/20 97.6 F (36.4 C) (Oral)   BP Readings from Last 3 Encounters:  10/28/20 (!) 166/108  10/26/20 (!) 142/78  10/05/20 (!) 143/90   Pulse Readings from Last 3 Encounters:  10/28/20 (!) 112  10/26/20 (!) 104  10/05/20 95    Wt Readings from Last 3 Encounters:  10/28/20 228 lb (103.4 kg)  10/26/20 228 lb (103.4 kg)  09/30/20 228 lb 12.8 oz (103.8 kg)     LABS  reviewed today   Lab Results  Component Value Date   WBC 10.5 10/26/2020   HGB 15.4 10/26/2020   HCT 48.7 10/26/2020   MCV 94.9 10/26/2020   PLT 342 10/26/2020   Lab Results  Component Value Date   CREATININE 1.32 09/30/2020   BUN 29 (H) 09/30/2020   NA 138 09/30/2020   K 4.2 09/30/2020   CL 104 09/30/2020   CO2 27 09/30/2020   Lab Results  Component Value Date   ALT 24 10/26/2020   AST 16 10/26/2020   ALKPHOS 94 10/26/2020   BILITOT 0.6 10/26/2020   Lab Results  Component Value Date   CHOL 96 10/26/2020   HDL 46 10/26/2020   LDLCALC 38 10/26/2020   LDLDIRECT 64.0 03/20/2018   TRIG 60 10/26/2020   CHOLHDL 2.1 10/26/2020    Lab Results  Component Value Date   HGBA1C 8.4 (H) 09/30/2020   No results found for: TSH   STUDIES/PROCEDURES reviewed today   [place] ABIs  2019 Final Interpretation:  Right: Resting right ankle-brachial index indicates mild right lower  extremity arterial disease. The right toe-brachial index is abnormal.  Left: The left toe-brachial index is abnormal.  Although ankle brachial indices are within normal limits (0.95-1.29),  arterial Doppler waveforms at the ankle suggest some component of arterial  occlusive disease.    PFTs Moderate Obstructive Airways Disease with Significant Broncho-Dilator Response Consider outpatient Pulmonary Consultation if needed Clinical Correlation Advised  Carotids 2019 Right Carotid: Velocities in the right ICA are consistent with a 1-39%  stenosis. Triphasic flow in the brachial artery.  Left Carotid: Velocities in the left ICA are consistent with a 1-39%  stenosis.   Triphasic flow in the brachial artery.  Vertebrals: High resistant flow in the left vertebral artery may suggest  distal obstruction.  Elevated velocities in the right vertebral.  Subclavians: Normal flow hemodynamics were seen in bilateral subclavian  arteries. Technically challenging study due to vessel depth and neck  girth.   Echo 03/2017 Left ventricle: The cavity size was normal. Systolic function was  normal. The estimated ejection fraction was in the range of 55%  to 60% based on apical images. The study is not technically  sufficient to allow evaluation of LV diastolic function.  - Right ventricle: Systolic function was normal.  - Pulmonary arteries: Systolic pressure could not be accurately  estimated.   LHC 07/2016  There is moderate to severe left ventricular systolic dysfunction.  The left ventricular ejection fraction is 25-35% by visual estimate.  There is trivial (1+) mitral regurgitation.  There is no aortic valve stenosis.  Mid LAD lesion, 20 %stenosed.  Ost 2nd Diag to 2nd Diag lesion, 90 %stenosed.  Dist LAD lesion, 40 %stenosed.  Ost 1st Diag lesion, 90 %stenosed.  1st Diag lesion, 80 %stenosed.  1st Mrg lesion, 90 %stenosed.  1st RPLB lesion, 90 %stenosed. 1. No evidence of obstructive disease affecting the main arteries. Patent mid LAD stent with mild in-stent restenosis and moderate LAD disease distally. The patient does have diffuse small vessel branch disease affecting 2 diagonals, OM1 and PL 1. The disease is distal. 2. Moderately to severely  reduced LV systolic function with an EF of 25-30% with global hypokinesis. 3. Moderately elevated filling pressures. Recommendations: Compared to his most recent cardiac catheterization in 2016, the ejection fraction has decreased significantly. The patient seems to have mixed ischemic/ nonischemic cardiomyopathy. Continue medical therapy for coronary artery disease. Optimize heart failure treatment. I increased the dose of lisinopril. The patient is volume overloaded and I started oral furosemide. I'm going to avoid aggressive diuresis due to contrast exposure. Consider screening for sleep apnea if that has not been done in the past.   Assessment & Plan    Coronary artery disease -Denies angina but does report dyspnea.  S/p LAD stenting with non-STEMI 07/2016.  LHC showed moderate to severe diffuse small vessel disease and branch disease disease with patent LAD stent.  He has been medically managed since that time.  Given his arterial insufficiency, he reports significant deconditioning, which could certainly be contributing to his dyspnea.  Will update an echo to reassess EF, wall motion, valvular function, and rule out any acute structural changes.  Recommend sleep study - consider watch sleep study when available. Glycemic and cholesterol control recommended. Continue ASA, Coreg, Zetia, Imdur, PRN SL nitro. Nitro refill to be provided. He is no longer on a statin so will recheck his LDL today. Reviewed heart healthy diet and exercise recommendations.   Chronic diastolic heart failure --Reports dyspnea, which is chronic but uncertain if worsened from previous visits.  DOE is likely multifactorial in the setting of mixed ischemic and nonischemic heart dz, COPD, residual COVID sx, and deconditioning. Euvolemic on exam with wt stable from other office visits recorded. He also has not yet taken today's lasix. Discussed daily wts, BP checks. Will request SW to assist with obtaining a cuff and scale if  needed. As above, will update an echo to reassess EF, wall motion, valvular function, structure.  Reviewed salt and fluid intake with pt understanding. Continue Coreg, lasix, potasium. Continue Jardiance for risk factor modification and per recent ACC guidelines.  Continue lisinopril for comorbid DM2.  COPD /previous history of smoking /history of COVID-19 --As above, he does report dyspnea.  Suspect this is multifactorial with consideration of h/o COVID-19, COPD, NICM/ICM, as well as some element of deconditioning.  Previous PFTs by PCP show moderate obstructive airway disease with significant bronchodilator response.  Euvolemic on exam today. On review of EMR, he has had some trouble obtaining his inhaler due to insurance issues.  Will defer for PCP for further follow-up.  Essential hypertension, goal BP <130/80 --BP sub-optimal at 142/78; however, he has not taken any of his medications today.  He also is not checking his blood pressure at home.  He is unable to obtain a BP cuff.  Social work contacted and will supply with BP cuff.  Reviewed technique and obtaining blood pressure today at length.  Unfortunately, given he has not taken his medications this morning, I am unable to make any changes to his current regimen today.  Continue Coreg, lisinopril, Imdur, and lasix (with potassium). Instructions provided that he should take his blood pressure medicine at least 2 hours before his next clinic visit. He has been instructed to call the office if  BP consistently over 130/80, at which time medication changes would be recommended.    DM2 --Most recent A1C 09/30/20 at 8.4. Glycemic control recommended.  Reviewed diet at length.  Recommend glycemic control from a cardiovascular standpoint.  Continue Jardiance per ACC guidelines.  Continue lisinopril given comorbid hypertension.  HLD, LDL goal less than 70 --Previous LDL 86 and not at goal of below 70. Continue Zetia.  Will recheck LDL and LFTs to ensure at  goal.  His statin has fallen off of his list.  Bilateral LE arterial dz Bilateral Carotid dz --Continue to follow with vascular surgery. Most recent LE arterial and carotid studies as above. Reduced bilateral pedal pulses on exam. Recommend LDL and glycemic control, as well as HR/BP control. Discussed increasing activity as claudication sx allow. Continue ASA, Plavix, Zetia. Will update carotid studies.  Medication changes: None. Refilled nitro. Med changes deferred, as he had not taken his BP medications yet today. Labs ordered: Lipid, liver, CBC, BNP Studies / Imaging ordered: Echo, carotids Future considerations: Take BP meds 2 hours before clinic visit. Adjust BP medications if needed at RTC. Disposition: RTC 6 months or sooner if indicated by echo or home BP checks     Arvil Chaco, PA-C

## 2020-10-26 NOTE — Patient Instructions (Signed)
Medication Instructions:  Your physician recommends that you continue on your current medications as directed. Please refer to the Current Medication list given to you today.  *If you need a refill on your cardiac medications before your next appointment, please call your pharmacy*   Lab Work:  Your physician recommends that you have lab work TODAY: Lipid / Liver panel, CBC, BNP -  Please go to the [facility]. You will check in at the front desk to the right as you walk into the atrium.   If you have labs (blood work) drawn today and your tests are completely normal, you will receive your results only by: [name] MyChart Message (if you have MyChart) OR . A paper copy in the mail If you have any lab test that is abnormal or we need to change your treatment, we will call you to review the results.   Testing/Procedures:  1) Your physician has requested that you have a carotid duplex. This test is an ultrasound of the carotid arteries in your neck. It looks at blood flow through these arteries that supply the brain with blood. Allow one hour for this exam. There are no restrictions or special instructions.  2) Your physician has requested that you have an echocardiogram. Echocardiography is a painless test that uses sound waves to create images of your heart. It provides your doctor with information about the size and shape of your heart and how well your heart's chambers and valves are working. This procedure takes approximately one hour. There are no restrictions for this procedure.   Follow-Up: At [facility], you and your health needs are our priority.  As part of our continuing mission to provide you with exceptional heart care, we have created designated Provider Care Teams.  These Care Teams include your primary Cardiologist (physician) and Advanced Practice Providers (APPs -  Physician Assistants and Nurse Practitioners) who all work together to provide you with the care you need,  when you need it.  We recommend signing up for the patient portal called "MyChart".  Sign up information is provided on this After Visit Summary.  MyChart is used to connect with patients for Virtual Visits (Telemedicine).  Patients are able to view lab/test results, encounter notes, upcoming appointments, etc.  Non-urgent messages can be sent to your provider as well.   To learn more about what you can do with MyChart, go to [email or web address].    Your next appointment:   6 month(s)  The format for your next appointment:   In Person  Provider:   You may see Julien Nordmann, MD or one of the following Advanced Practice Providers on your designated Care Team:     Marisue Ivan, [place]   Other Instructions  1) Please monitor your blood pressure occasionally at home. Call your doctor if you tend to get readings of greater than 130 on the top number or 80 on the bottom number. We are contacting someone to assist with supplying a blood pressure cuff for you.   2) Prior to next appointment, please take your blood pressure medications 2 hours prior (may want to schedule appointment in the morning if you are able).

## 2020-10-27 ENCOUNTER — Other Ambulatory Visit (INDEPENDENT_AMBULATORY_CARE_PROVIDER_SITE_OTHER): Payer: Self-pay | Admitting: Podiatry

## 2020-10-27 ENCOUNTER — Ambulatory Visit (INDEPENDENT_AMBULATORY_CARE_PROVIDER_SITE_OTHER): Payer: 59 | Admitting: Pharmacist

## 2020-10-27 ENCOUNTER — Telehealth: Payer: Self-pay | Admitting: Licensed Clinical Social Worker

## 2020-10-27 DIAGNOSIS — E1165 Type 2 diabetes mellitus with hyperglycemia: Secondary | ICD-10-CM | POA: Diagnosis not present

## 2020-10-27 DIAGNOSIS — E118 Type 2 diabetes mellitus with unspecified complications: Secondary | ICD-10-CM | POA: Diagnosis not present

## 2020-10-27 DIAGNOSIS — L97529 Non-pressure chronic ulcer of other part of left foot with unspecified severity: Secondary | ICD-10-CM

## 2020-10-27 DIAGNOSIS — IMO0002 Reserved for concepts with insufficient information to code with codable children: Secondary | ICD-10-CM

## 2020-10-27 MED ORDER — TRESIBA FLEXTOUCH 100 UNIT/ML ~~LOC~~ SOPN: 40.0000 [IU] | PEN_INJECTOR | Freq: Every day | SUBCUTANEOUS | 3 refills | Status: DC

## 2020-10-27 NOTE — Telephone Encounter (Signed)
CSW referred to assist patient with obtaining a BP cuff. CSW contacted patient to inform cuff will be delivered to home. Patient grateful for support and assistance. CSW available as needed. Jackie Brennan, LCSW, CCSW-MCS [phone]  

## 2020-10-27 NOTE — Patient Instructions (Addendum)
Scan the sensor at least every 8 hours. You can scan more than this if you want.   DECREASE TRESIBA TO 40 UNITS DAILY  Take your medications as below:  Morning:  - metformin XR 500 mg - carvedilol 12.5 mg - clopidogrel 75 mg - Jardiance 25 mg - escitalopram 10 mg - ezetimibe 10 mg,  - isosorbide 30 mg  - furosemide 20 mg EVERY OTHER DAY - potassium 10 mEq EVERY OTHER DAY  Evening:  - metformin XR 500 mg - carvedilol 12.5 mg - lisinopril 2.5 mg - atorvastatin 80 mg - ezetimibe 10 mg - tamsulosin 0.4 mg   Keep taking Trulicity 4.5 mg weekly.   Visit Information  PATIENT GOALS: Goals Addressed              This Visit's Progress     Patient Stated   .  Medication Monitoring (pt-stated)        Patient Goals/Self-Care Activities . Over the next 90 days, patient will:  - take medications as prescribed check blood glucose at least three times daily using CGM, document, and provide at future appointments collaborate with provider on medication access solutions        Print copy of patient instructions, educational materials, and care plan provided in person.   Plan: Face to Face appointment with care management team member scheduled for: ~ 4 weeks  Catie Feliz Beam, PharmD, [place], CPP Clinical Pharmacist [employer] at [employer] [phone]

## 2020-10-27 NOTE — Chronic Care Management (AMB) (Signed)
Chronic Care Management Pharmacy Note  10/27/2020 Name:  Danny Koral Mcgann Sr. MRN:  [id number] DOB:  [birth year]  Subjective: Danny Estimable Granja Sr. is an 70 y.o. year old male who is a primary patient of [name], [name], MD.  The CCM team was consulted for assistance with disease management and care coordination needs.    Engaged with patient by telephone for follow up visit in response to provider referral for pharmacy case management and/or care coordination services.   Consent to Services:  The patient was given information about Chronic Care Management services, agreed to services, and gave verbal consent prior to initiation of services.  Please see initial visit note for detailed documentation.   Patient Care Team: [name], MD as PCP - General (Family Medicine) Rockey Situ Kathlene November, MD as PCP - Cardiology (Cardiology) [name], MD as Consulting Physician (Cardiology) De Hollingshead, RPH-CPP as Pharmacist (Pharmacist)  Recent office visits:  None since our last call  Recent consult visits:  3/18 - podiatry for foot ulcer. Possibly pre-gangrenous. Urgent referral to vascular. Appointment tomorrow  3/23 - cardiology Lorenso Quarry. Working on help get home blood pressure cuff.   Hospital visits: None in previous 6 months  Objective:  Lab Results  Component Value Date   CREATININE 1.32 09/30/2020   CREATININE 1.34 03/29/2020   CREATININE 1.30 01/05/2020    Lab Results  Component Value Date   HGBA1C 8.4 (H) 09/30/2020   Last diabetic Eye exam:  Lab Results  Component Value Date/Time   HMDIABEYEEXA No Retinopathy 04/25/2018 12:56 PM    Last diabetic Foot exam: No results found for: HMDIABFOOTEX      Component Value Date/Time   CHOL 96 10/26/2020 1048   TRIG 60 10/26/2020 1048   HDL 46 10/26/2020 1048   CHOLHDL 2.1 10/26/2020 1048   VLDL 12 10/26/2020 1048   LDLCALC 38 10/26/2020 1048   LDLDIRECT 64.0 03/20/2018 0950    Hepatic  Function Latest Ref Rng & Units 10/26/2020 11/05/2019 08/02/2019  Total Protein 6.5 - 8.1 g/dL 7.3 7.7 6.2(L)  Albumin 3.5 - 5.0 g/dL 3.8 4.1 3.4(L)  AST 15 - 41 U/L 16 29 34  ALT 0 - 44 U/L 24 41 30  Alk Phosphatase 38 - 126 U/L 94 113 64  Total Bilirubin 0.3 - 1.2 mg/dL 0.6 0.7 0.5  Bilirubin, Direct 0.0 - 0.2 mg/dL <0.1 - -     CBC Latest Ref Rng & Units 10/26/2020 12/24/2019 12/22/2019  WBC 4.0 - 10.5 K/uL 10.5 8.0 10.3  Hemoglobin 13.0 - 17.0 g/dL 15.4 15.9 17.3(H)  Hematocrit 39.0 - 52.0 % 48.7 48.8 51.4  Platelets 150 - 400 K/uL 342 249 260     Clinical ASCVD: Yes - CAD   Social History   Tobacco Use  Smoking Status Former Smoker  . Packs/day: 3.00  . Years: 0.00  . Pack years: 0.00  . Types: Cigarettes  . Quit date: 09/07/2009  . Years since quitting: 11.1  Smokeless Tobacco Never Used  Tobacco Comment   quit june 2011   BP Readings from Last 3 Encounters:  10/26/20 (!) 142/78  10/05/20 (!) 143/90  09/30/20 100/60   Pulse Readings from Last 3 Encounters:  10/26/20 (!) 104  10/05/20 95  09/30/20 90   Wt Readings from Last 3 Encounters:  10/26/20 228 lb (103.4 kg)  09/30/20 228 lb 12.8 oz (103.8 kg)  06/29/20 229 lb 6.4 oz (104.1 kg)     Assessment: Review  of patient past medical history, allergies, medications, health status, including review of consultants reports, laboratory and other test data, was performed as part of comprehensive evaluation and provision of chronic care management services.   SDOH:  (Social Determinants of Health) assessments and interventions performed:  SDOH Interventions   Flowsheet Row Most Recent Value  SDOH Interventions   Financial Strain Interventions Intervention Not Indicated  Stress Interventions Other (Comment)  [provided empathetic listening]      CCM Care Plan  No Known Allergies  Medications Reviewed Today    Reviewed by Magda Bernheim (Physician Assistant Certified) on 29/47/65 at [facility]  Status: <None>  Medication Order Taking? Sig Documenting Provider Last Dose Status Informant  ACCU-CHEK GUIDE test strip [id number] Yes CHECK CBGS 4 TIMES A DAY [name], MD Taking Active   albuterol (PROVENTIL) (2.5 MG/3ML) 0.083% nebulizer solution [id number] Yes Take 3 mLs (2.5 mg total) by nebulization every 6 (six) hours as needed for wheezing or shortness of breath. [name], MD Taking Active   amoxicillin-clavulanate (AUGMENTIN) 875-125 MG tablet [id number] Yes Take 1 tablet by mouth 2 (two) times daily. [provider] Taking Active   aspirin 81 MG tablet [id number] Yes Take 81 mg by mouth daily. [provider] Taking Active Self  atorvastatin (LIPITOR) 80 MG tablet [id number] Yes Take 1 tablet (80 mg total) by mouth daily. [name], MD Taking Active   baclofen (LIORESAL) 10 MG tablet [id number] Yes TAKE 1 TABLET BY MOUTH THREE TIMES A DAY AS NEEDED FOR MUSCLE SPASMS [name], MD Taking Active   blood glucose meter kit and supplies KIT [id number] Yes Dispense based on patient and insurance preference. Check CBGs 4 times daily. Dx code E11.9. [name], MD Taking Active   carvedilol (COREG) 12.5 MG tablet [id number] Yes TAKE 1 TABLET (12.5 MG TOTAL) BY MOUTH 2 (TWO) TIMES DAILY WITH A MEAL. [name], MD Taking Active   clopidogrel (PLAVIX) 75 MG tablet [id number] Yes TAKE 1 TABLET BY MOUTH EVERY DAY Gollan, Kathlene November, MD Taking Active   Dulaglutide (TRULICITY) 4.5 DS/2.8JG SOPN [id number] Yes Inject 4.5 mg as directed once a week. [name], MD Taking Active   empagliflozin (JARDIANCE) 25 MG TABS tablet [id number] Yes Take 25 mg by mouth daily. [name], MD Taking Active   escitalopram (LEXAPRO) 10 MG tablet [id number] Yes TAKE 1 TABLET BY MOUTH EVERY DAY Einar Pheasant, MD Taking Active   ezetimibe (ZETIA) 10 MG tablet [id number] Yes TAKE 1 TABLET BY MOUTH EVERY DAY [name], MD Taking Active    furosemide (LASIX) 20 MG tablet [id number] Yes TAKE 1 TABLET (20 MG) BY MOUTH ONCE EVERY OTHER DAY Gollan, Kathlene November, MD Taking Active   Insulin Pen Needle (PEN NEEDLES) 32G X 6 MM MISC [id number] Yes 1 each by Does not apply route 2 (two) times daily. [name], MD Taking Active   isosorbide mononitrate (IMDUR) 30 MG 24 hr tablet [id number] Yes TAKE 1 TABLET BY MOUTH EVERY DAY Gollan, Kathlene November, MD Taking Active   lisinopril (ZESTRIL) 2.5 MG tablet [id number] Yes Take 1 tablet (2.5 mg total) by mouth daily. [name], MD Taking Active   metFORMIN (GLUCOPHAGE-XR) 500 MG 24 hr tablet [id number] Yes TAKE 1 TABLET BY MOUTH TWICE A DAY [name], MD Taking Active   nitroGLYCERIN (NITROSTAT) 0.4 MG SL tablet [id number]  Place 1 tablet (0.4 mg total) under the tongue every 5 (  five) minutes as needed for chest pain. Maximum of 3 doses. Marrianne Mood D, PA-C  Active   potassium chloride (MICRO-K) 10 MEQ CR capsule [id number] Yes TAKE 1 CAPSULE (10 MEQ) BY MOUTH ONCE EVERY OTHER DAY WITH LASIX (FUROSEMIDE) Gollan, Kathlene November, MD Taking Active   tamsulosin (FLOMAX) 0.4 MG CAPS capsule [id number] Yes TAKE 1 CAPSULE BY MOUTH EVERY DAY Einar Pheasant, MD Taking Active   TRESIBA FLEXTOUCH 100 UNIT/ML FlexTouch Pen [id number] Yes INJECT (45 UNITS TOTAL) INTO THE SKIN DAILY. [name], MD Taking Active   VENTOLIN HFA 108 [phone] Base) MCG/ACT inhaler [id number] Yes Inhale 2 puffs into the lungs every 6 (six) hours as needed for wheezing or shortness of breath. [name], MD Taking Active           Patient Active Problem List   Diagnosis Date Noted  . Low back pain 06/29/2020  . Onychomycosis 03/29/2020  . Fall 03/29/2020  . AKI (acute kidney injury) ([place]) 12/28/2019  . Coronary artery disease of native artery of native heart with stable angina pectoris ([place]) 12/12/2018  . Pure hypercholesterolemia 12/12/2018  . Chest pain 10/31/2018  . Atherosclerosis of native arteries  of extremity with intermittent claudication ([place]) 02/11/2018  . Decreased pedal pulses 07/02/2017  . Orthostasis 05/07/2017  . Anxiety and depression 05/07/2017  . CKD (chronic kidney disease) stage 3, GFR 30-59 ml/min (HCC) 03/06/2017  . Diabetic neuropathy ([place]) 03/06/2017  . Chronic combined systolic and diastolic CHF (congestive heart failure) ([place]) 03/05/2017  . Morbid obesity ([place]) 06/18/2016  . H/O medication noncompliance 06/18/2016  . COPD (chronic obstructive pulmonary disease) ([place]) 06/20/2015  . GERD (gastroesophageal reflux disease) 12/20/2014  . Diabetes mellitus type 2, uncontrolled, with complications ([place]) 12/87/8676  . Hyperlipidemia 02/10/2010  . Essential hypertension 02/10/2010  . CAD (coronary artery disease) 02/10/2010    Immunization History  Administered Date(s) Administered  . Fluad Quad(high Dose 65+) 08/02/2019, 09/30/2020  . Influenza, High Dose Seasonal PF 09/01/2018  . Moderna Sars-Covid-2 Vaccination 04/07/2020, 05/09/2020  . Pneumococcal Polysaccharide-23 08/02/2019    Conditions to be addressed/monitored: CAD, HTN, HLD, DMII and Depression  Care Plan : Medication Management  Updates made by De Hollingshead, RPH-CPP since 10/27/2020 12:00 AM    Problem: Diabetes, Hypertension, Depression, CHF     Long-Range Goal: Disease Progression Prevention   This Visit's Progress: On track  Recent Progress: On track  Priority: High  Note:   Current Barriers:  . Unable to independently monitor therapeutic efficacy . Unable to achieve control of diabetes  . Low level of healthcare literacy  Pharmacist Clinical Goal(s):  [name] Over the next 90 days, patient will achieve adherence to monitoring guidelines and medication adherence to achieve therapeutic efficacy. . Over the next 90 days, patient will improve control of diabetes as evidenced by A1c.   Interventions: . 1:1 collaboration with [name], MD regarding development and update of  comprehensive plan of care as evidenced by provider attestation and co-signature . Inter-disciplinary care team collaboration (see longitudinal plan of care) . Comprehensive medication review performed; medication list updated in electronic medical record  SDOH: . Received support to get a home BP cuff from cardiology yesterday . Confirms use of twice daily pill box + list of how to fill pill box that I previously provided to him . Reports significant foot pain today. Almost canceled our appointment.  Diabetes: . Uncontrolled; current treatment: metformin XR 720 mg BID, Trulicity 4.5 mg weekly, Jardiance 25 mg daily, Tresiba 45  units daily . While in clinic, patient scanned glucose and it was 78 mg/dL with an arrow pointing down at a 45 degree angle (so glucose slowly dropping). Provided with graham crackers, peanut butter, water. Waited 15 minutes. Rose to 98 with a level arrow (glucose level relatively stable). Patient did not drive himself to clinic today, had a friend bring him. Encouraged to continue to monitor glucose frequently at home.  . Current glucose readings: using Libre 2 CGM Date of Download: 3/11-3/24/22 % Time CGM is active: 32% (<70% limits interpretation) Average Glucose: 118 mg/dL Glucose Variability: 29 (goal <36%) Time in Goal:  - Time in range 70-180: 89% - Time above range: 5% - Time below range: 6% Observed patterns: fasting hypoglycemia.   . Reports vision changes. Needs to schedule w/ ophthalmologist, working through foot ulcer first . Encouraged to scan at least every 8 hours for full capture of glucose readings.  [place] Tresiba to 40 units daily due to recurrent fasting hypoglycemia. Continue metformin XR 466 mg BID, Trulicity 4.5 mg weekly, Jardiance 25 mg daily  . Discussed that he is having frequent lows, leading to overtreatment and subsequent highs.   Hypertension, CAD, CHF . Controlled; current treatment: carvedilol 12.5 mg BID, furosemide 20 mg  every other day, isosorbide 30 mg daily, lisinopril 2.5 mg daily, potassium 10 mEq every other day.   . Cardiology appointment yesterday. Note not completed yet, but patient notes that the appointment "went well". No apparent medication changes yet.  . Antiplatelet: aspirin 81 mg daily  . Recommend to continue current regimen at this time.   Hyperlipidemia: . Very well controlled; current treatment: atorvastatin 80 mg daily, ezetimibe 10 mg daily; suspect non-adherence to atorvastatin 80 mg daily at the time of pre-ezetimibe lipid panel   . Recommended to continue current regimen at this time.  . Consider d/c ezetimibe moving forward to reduce pill burden with lipid recheck to ensure LDL still <70.   Depression/Anxiety: . Appropriately well controlled; escitalopram 10 mg daily . Denies mood concerns today. Stressed about possible toe amputation tomorrow.  . Recommended to continue current regimen at this time  BPH . Appropriately well controlled per patient symptom report; tamsulosin 0.4 mg daily . Recommended to continue current regimen  SOB: . Uncontrolled per patient symptom report; insurance preferred Advair, so this was prescribed a few weeks ago . Will discuss adherence and control of symptoms at future appointments.   Patient Goals/Self-Care Activities . Over the next 90 days, patient will:  - take medications as prescribed check blood glucose at least three times daily using CGM, document, and provide at future appointments collaborate with provider on medication access solutions  Follow Up Plan: Face to Face appointment with care management team member scheduled for:  ~ 4 weeks       Medication Assistance: None required.  Patient affirms current coverage meets needs. Medicare + Medicaid  Patient's preferred pharmacy is:  [facility], [address] [place] [address] Phone: [phone] Fax: [phone]  [id number] [place] [id number]: [phone]: [phone]  Follow Up:  Patient agrees to Care Plan and Follow-up.  Plan: Face to Face appointment with care management team member scheduled for: ~ 4 weeks  Catie TDarnelle Maffucci PharmD, [place] CColumbiaClinical Pharmacist LOccidental Petroleumat BJohnson [employer] [phone]

## 2020-10-28 ENCOUNTER — Ambulatory Visit (INDEPENDENT_AMBULATORY_CARE_PROVIDER_SITE_OTHER): Payer: 59

## 2020-10-28 ENCOUNTER — Ambulatory Visit (INDEPENDENT_AMBULATORY_CARE_PROVIDER_SITE_OTHER): Payer: 59 | Admitting: Vascular Surgery

## 2020-10-28 ENCOUNTER — Other Ambulatory Visit
Admission: RE | Admit: 2020-10-28 | Discharge: 2020-10-28 | Disposition: A | Discharge status: Home / Self Care | Payer: 59 | Source: Ambulatory Visit | Attending: Vascular Surgery | Admitting: Vascular Surgery

## 2020-10-28 ENCOUNTER — Telehealth (INDEPENDENT_AMBULATORY_CARE_PROVIDER_SITE_OTHER): Payer: Self-pay

## 2020-10-28 ENCOUNTER — Other Ambulatory Visit: Payer: Self-pay

## 2020-10-28 VITALS — BP 166/108 | HR 112 | Ht 72.0 in | Wt 228.0 lb

## 2020-10-28 DIAGNOSIS — I70262 Atherosclerosis of native arteries of extremities with gangrene, left leg: Secondary | ICD-10-CM

## 2020-10-28 DIAGNOSIS — I10 Essential (primary) hypertension: Secondary | ICD-10-CM | POA: Diagnosis not present

## 2020-10-28 DIAGNOSIS — E1142 Type 2 diabetes mellitus with diabetic polyneuropathy: Secondary | ICD-10-CM

## 2020-10-28 DIAGNOSIS — I70269 Atherosclerosis of native arteries of extremities with gangrene, unspecified extremity: Secondary | ICD-10-CM | POA: Insufficient documentation

## 2020-10-28 DIAGNOSIS — Z01812 Encounter for preprocedural laboratory examination: Secondary | ICD-10-CM | POA: Insufficient documentation

## 2020-10-28 DIAGNOSIS — E118 Type 2 diabetes mellitus with unspecified complications: Secondary | ICD-10-CM

## 2020-10-28 DIAGNOSIS — E7800 Pure hypercholesterolemia, unspecified: Secondary | ICD-10-CM

## 2020-10-28 DIAGNOSIS — L97529 Non-pressure chronic ulcer of other part of left foot with unspecified severity: Secondary | ICD-10-CM

## 2020-10-28 DIAGNOSIS — Z20822 Contact with and (suspected) exposure to covid-19: Secondary | ICD-10-CM | POA: Insufficient documentation

## 2020-10-28 DIAGNOSIS — IMO0002 Reserved for concepts with insufficient information to code with codable children: Secondary | ICD-10-CM

## 2020-10-28 DIAGNOSIS — E1165 Type 2 diabetes mellitus with hyperglycemia: Secondary | ICD-10-CM

## 2020-10-28 NOTE — Progress Notes (Signed)
Patient ID: Danny Organ Sr., male   DOB: 1950/11/04, 70 y.o.   MRN: [id number]  Chief Complaint  Patient presents with  . New Patient (Initial Visit)    Danny [name]. PVD left toe gangrene Danny [name]. ABI    HPI Danny NEALY Sr. is a 70 y.o. male.  I am asked to see the patient by Dr. Cleda [name] for evaluation of a gangrenous left great toe.  He was seen almost 3 years ago and noted to have significant peripheral arterial disease at that time.  He was unable to get anything done due to caring for an ailing mother and having multiple other issues at that point.  He has a litany of medical issues as listed below and significant heart disease.  Over the past several weeks, his left great toe has turned purple and cold.  There is been no clear cause or inciting event.  Nothing is really made it better.  He has such significant neuropathy in his feet that is not that painful for him.  His ambulation is somewhat limited.  He denies any fever chills or signs of systemic infection.  To evaluate his perfusion noninvasive studies were performed today.  ABIs were falsely elevated from medial calcification but his right digital pressure was only mildly reduced but his left digital pressure was markedly reduced.  His waveforms were fairly poor worse on the left than the right.     Past Medical History:  Diagnosis Date  . CAD (coronary artery disease)    a. 01/2010 PCI of LAD; b. 09/2014 PCI/DES of mLAD due to ISR. D1 80, D2 80(jailed), LCX 49m c. 07/2016 NSTEMI/Cath: LM nl, LAD 241mSR, 40d, D1 90ost, 80p, D2 90ost, RI min irregs, LCX min irregs, OM1 90 small, OM2/3 min irregs, RCA min irregs, RPLB1 90, EF 25-35%.  . Chronic combined systolic and diastolic CHF (congestive heart failure) ([facility]   a. 07/2016 Echo: EF 30%, severe septal/anterior HK, Gr1 DD.  . Danny [name] (chronic kidney disease), stage III ([facility]  . COPD ([place] obstructive pulmonary disease) ([facility]  . DM2 (diabetes mellitus, type 2) ([facility]  . Erectile  dysfunction   . HLD (hyperlipidemia)   . HTN (hypertension)   . Ischemic cardiomyopathy    a. 07/2016 Echo: EF 30% w/ sev septal/ant HK. Gr1 DD.    Past Surgical History:  Procedure Laterality Date  . CAD: stent to the LAD    . CARDIAC CATHETERIZATION  10/01/2014  . CARDIAC CATHETERIZATION N/A 07/23/2016   Procedure: Left Heart Cath and Coronary Angiography;  Surgeon: Danny [name];  [facility];  Service: Cardiovascular;  Laterality: N/A;  . CORONARY ANGIOPLASTY WITH STENT PLACEMENT  10/01/2014     Family History  Problem Relation Age of Onset  . Heart attack Father        complications  . Cancer Brother   No bleeding disorders, clotting disorders, or aneurysms   Social History   Tobacco Use  . Smoking status: Former Smoker    Packs/day: 3.00    Years: 0.00    Pack years: 0.00    Types: Cigarettes    Quit date: 09/07/2009    Years since quitting: 11.1  . Smokeless tobacco: Never Used  . Tobacco comment: quit june 2011  Substance Use Topics  . Alcohol use: Yes    Comment: every other weekend  . Drug use: No    No Known Allergies  Current Outpatient Medications  Medication Sig Dispense Refill  . ACCU-CHEK GUIDE test strip CHECK CBGS 4 TIMES A DAY 300 strip 1  . albuterol (PROVENTIL) (2.5 MG/3ML) 0.083% nebulizer solution Take 3 mLs (2.5 mg total) by nebulization every 6 (six) hours as needed for wheezing or shortness of breath. 150 mL 1  . amoxicillin-clavulanate (AUGMENTIN) 875-125 MG tablet Take 1 tablet by mouth 2 (two) times daily.    Danny Kitchen aspirin 81 MG tablet Take 81 mg by mouth daily.    Danny Kitchen atorvastatin (LIPITOR) 80 MG tablet Take 1 tablet (80 mg total) by mouth daily. 90 tablet 3  . baclofen (LIORESAL) 10 MG tablet TAKE 1 TABLET BY MOUTH THREE TIMES A DAY AS NEEDED FOR MUSCLE SPASMS 30 tablet 0  . blood glucose meter kit and supplies KIT Dispense based on patient and insurance preference. Check CBGs 4 times daily. Dx code E11.9. 1 each 0   . carvedilol (COREG) 12.5 MG tablet TAKE 1 TABLET (12.5 MG TOTAL) BY MOUTH 2 (TWO) TIMES DAILY WITH A MEAL. 180 tablet 3  . clopidogrel (PLAVIX) 75 MG tablet TAKE 1 TABLET BY MOUTH EVERY DAY 90 tablet 3  . Dulaglutide (TRULICITY) 4.5 JJ/0.0XF SOPN Inject 4.5 mg as directed once a week. 6 mL 3  . empagliflozin (JARDIANCE) 25 MG TABS tablet Take 25 mg by mouth daily. 90 tablet 3  . escitalopram (LEXAPRO) 10 MG tablet TAKE 1 TABLET BY MOUTH EVERY DAY 90 tablet 1  . ezetimibe (ZETIA) 10 MG tablet TAKE 1 TABLET BY MOUTH EVERY DAY 90 tablet 3  . furosemide (LASIX) 20 MG tablet TAKE 1 TABLET (20 MG) BY MOUTH ONCE EVERY OTHER DAY 45 tablet 3  . insulin degludec (TRESIBA FLEXTOUCH) 100 UNIT/ML FlexTouch Pen Inject 40 Units into the skin daily. 45 mL 3  . Insulin Pen Needle (PEN NEEDLES) 32G X 6 MM MISC 1 each by Does not apply route 2 (two) times daily. 100 each 11  . isosorbide mononitrate (IMDUR) 30 MG 24 hr tablet TAKE 1 TABLET BY MOUTH EVERY DAY 90 tablet 3  . lisinopril (ZESTRIL) 2.5 MG tablet Take 1 tablet (2.5 mg total) by mouth daily. 90 tablet 3  . metFORMIN (GLUCOPHAGE-XR) 500 MG 24 hr tablet TAKE 1 TABLET BY MOUTH TWICE A DAY 180 tablet 3  . nitroGLYCERIN (NITROSTAT) 0.4 MG SL tablet Place 1 tablet (0.4 mg total) under the tongue every 5 (five) minutes as needed for chest pain. Maximum of 3 doses. 25 tablet 3  . potassium chloride (MICRO-K) 10 MEQ CR capsule TAKE 1 CAPSULE (10 MEQ) BY MOUTH ONCE EVERY OTHER DAY WITH LASIX (FUROSEMIDE) 45 capsule 3  . tamsulosin (FLOMAX) 0.4 MG CAPS capsule TAKE 1 CAPSULE BY MOUTH EVERY DAY 90 capsule 0  . VENTOLIN HFA 108 (90 Base) MCG/ACT inhaler Inhale 2 puffs into the lungs every 6 (six) hours as needed for wheezing or shortness of breath. 18 g 3   No current facility-administered medications for this visit.      REVIEW OF SYSTEMS (Negative unless checked)  Constitutional: _0 Weight loss  _1 Fever  _2 Chills Cardiac: _3 Chest pain   _4 Chest pressure    _5 Palpitations   _6 Shortness of breath when laying flat   _7 Shortness of breath at rest   _8 Shortness of breath with exertion. Vascular:  _9 Pain in legs with walking   _10 Pain in legs at rest   _11 Pain in legs when laying flat   _12 Claudication   _13 Pain in feet when walking  _14 Pain in feet at rest  _15 Pain in  feet when laying flat   _0 History of DVT   _1 Phlebitis   _2 Swelling in legs   _3 Varicose veins   _4 Non-healing ulcers Pulmonary:   _5 Uses home oxygen   _6 Productive cough   _7 Hemoptysis   _8 Wheeze  _9 COPD   _10 Asthma Neurologic:  _11 Dizziness  _12 Blackouts   _13 Seizures   _14 History of stroke   _15 History of TIA  _16 Aphasia   _17 Temporary blindness   _18 Dysphagia   _19 Weakness or numbness in arms   _20 Weakness or numbness in legs Musculoskeletal:  _21 Arthritis   _22 Joint swelling   _23 Joint pain   _24 Low back pain Hematologic:  _25 Easy bruising  _26 Easy bleeding   _27 Hypercoagulable state   _28 Anemic  _29 Hepatitis Gastrointestinal:  _30 Blood in stool   _31 Vomiting blood  _32 Gastroesophageal reflux/heartburn   _33 Abdominal pain Genitourinary:  _34 Chronic kidney disease   _35 Difficult urination  _36 Frequent urination  _37 Burning with urination   _38 Hematuria Skin:  _39 Rashes   _40 Ulcers   _41 Wounds Psychological:  _42 History of anxiety   _43  History of major depression.    Physical Exam BP (!) 166/108   Pulse (!) 112   Ht 6' (1.829 m)   Wt 228 lb (103.4 kg)   BMI 30.92 kg/m  Gen:  WD/WN, NAD.  Appears older than stated age Head: [place]/AT, No temporalis wasting.  Ear/Nose/Throat: Hearing grossly intact, nares w/o erythema or drainage, oropharynx w/o Erythema/Exudate Eyes: Conjunctiva clear, sclera non-icteric  Neck: trachea midline.  No JVD.  Pulmonary:  Good air movement, respirations not labored, no use of accessory muscles  Cardiac: Tachycardic and somewhat irregular Vascular:  Vessel Right Left  Radial Palpable Palpable                          DP  1+  trace  PT  not palpable  not palpable    Gastrointestinal:. No masses, surgical incisions, or scars. Musculoskeletal: M/S 5/5 throughout.  Left great toe is purple/blue and cold.  Clearly gangrenous.  Mild erythema in the forefoot below the toe.  1+ right lower extremity edema, 1-2+ left lower extremity Neurologic: Sensation grossly intact in extremities.  Symmetrical.  Speech is fluent. Motor exam as listed above. Psychiatric: Judgment intact, Mood & affect appropriate for pt's clinical situation. Dermatologic: Left great toe gangrene as above    Radiology No results found.  Labs Recent Results (from the past 2160 hour(s))  HgB A1c     Status: Abnormal   Collection Time: 09/30/20 11:23 AM  Result Value Ref Range   Hgb A1c MFr Bld 8.4 (H) 4.6 - 6.5 %    Comment: Glycemic Control Guidelines for People with Diabetes:Non Diabetic:  <6%Goal of Therapy: <7%Additional Action Suggested:  >2%   Basic Metabolic Panel (BMET)     Status: Abnormal   Collection Time: 09/30/20 11:23 AM  Result Value Ref Range   Sodium 138 135 - 145 mEq/L   Potassium 4.2 3.5 - 5.1 mEq/L   Chloride 104 96 - 112 mEq/L   CO2 27 19 - 32 mEq/L   Glucose, Bld 80 70 - 99 mg/dL   BUN 29 (H) 6 - 23 mg/dL   Creatinine, Ser 1.32 0.40 - 1.50 mg/dL   GFR 54.97 (L) >60.00 mL/min    Comment: Calculated using the CKD-EPI Creatinine Equation (2021)   Calcium 9.4 8.4 - 10.5 mg/dL  B Nat Peptide     Status: None   Collection Time: 10/26/20 10:48 AM  Result Value Ref Range   B Natriuretic Peptide 94.0  0.0 - 100.0 pg/mL    Comment: Performed at [facility], [address] [place], [place] [address]  CBC     Status: None   Collection Time: 10/26/20 10:48 AM  Result Value Ref Range   WBC 10.5 4.0 - 10.5 K/uL   RBC 5.13 4.22 - 5.81 MIL/uL   Hemoglobin 15.4 13.0 - 17.0 g/dL   HCT 48.7 39.0 - 52.0 %   MCV 94.9 80.0 - 100.0 fL   MCH 30.0 26.0 - 34.0 pg   MCHC 31.6 30.0 - 36.0 g/dL   RDW 12.2 11.5 - 15.5 %   Platelets 342 150 - 400 K/uL   nRBC 0.0 0.0  - 0.2 %    Comment: Performed at [facility], [address] [place], [place] [address]  Hepatic function panel     Status: None   Collection Time: 10/26/20 10:48 AM  Result Value Ref Range   Total Protein 7.3 6.5 - 8.1 g/dL   Albumin 3.8 3.5 - 5.0 g/dL   AST 16 15 - 41 U/L   ALT 24 0 - 44 U/L   Alkaline Phosphatase 94 38 - 126 U/L   Total Bilirubin 0.6 0.3 - 1.2 mg/dL   Bilirubin, Direct <0.1 0.0 - 0.2 mg/dL   Indirect Bilirubin NOT CALCULATED 0.3 - 0.9 mg/dL    Comment: Performed at [facility], [address] [place], [place] [address]  Lipid Profile     Status: None   Collection Time: 10/26/20 10:48 AM  Result Value Ref Range   Cholesterol 96 0 - 200 mg/dL   Triglycerides 60 <150 mg/dL   HDL 46 >40 mg/dL   Total CHOL/HDL Ratio 2.1 RATIO   VLDL 12 0 - 40 mg/dL   LDL Cholesterol 38 0 - 99 mg/dL    Comment:        Total Cholesterol/HDL:CHD Risk Coronary Heart Disease Risk Table                     Men   Women  1/2 Average Risk   3.4   3.3  Average Risk       5.0   4.4  2 X Average Risk   9.6   7.1  3 X Average Risk  23.4   11.0        Use the calculated Patient Ratio above and the CHD Risk Table to determine the patient's CHD Risk.        ATP III CLASSIFICATION (LDL):  <100     mg/dL   Optimal  100-129  mg/dL   Near or Above                    Optimal  130-159  mg/dL   Borderline  160-189  mg/dL   High  >190     mg/dL   Very High Performed at [facility], [address] [place], [place] [address]     Assessment/Plan:  Atherosclerosis of native arteries of the extremities with gangrene [facility]) To evaluate his perfusion noninvasive studies were performed today.  ABIs were falsely elevated from medial calcification but his right digital pressure was only mildly reduced but his left digital pressure was markedly reduced.  His waveforms were fairly poor worse on the left than the right.    Recommend:  The patient has evidence of  severe atherosclerotic changes of both lower extremities associated with ulceration and tissue loss of the foot.  This  represents a limb threatening ischemia and places the patient at the risk for limb loss.  Patient should undergo angiography of the lower extremities with the hope for intervention for limb salvage.  The risks and benefits as well as the alternative therapies was discussed in detail with the patient.  All questions were answered.  Patient agrees to proceed with angiography.  The patient will follow up with me in the office after the procedure.    Essential hypertension blood pressure control important in reducing the progression of atherosclerotic disease. On appropriate oral medications.   Diabetes mellitus type 2, uncontrolled, with complications (HCC) blood glucose control important in reducing the progression of atherosclerotic disease. Also, involved in wound healing. On appropriate medications.   Diabetic neuropathy (HCC) blood glucose control important in reducing the progression of atherosclerotic disease. Also, involved in wound healing. On appropriate medications.  His neuropathy is quite severe.   Hyperlipidemia lipid control important in reducing the progression of atherosclerotic disease. Continue statin therapy       Leotis Pain 10/28/2020, 1:33 PM   This note was created with Dragon medical transcription system.  Any errors from dictation are unintentional.

## 2020-10-28 NOTE — Patient Instructions (Signed)
Peripheral Vascular Disease  Peripheral vascular disease (PVD) is a disease of the blood vessels that carry blood from the heart to the rest of the body. PVD is also called peripheral artery disease (PAD) or poor circulation. PVD affects most of the body. But it affects the legs and feet the most. PVD can lead to acute limb ischemia. This happens when there is a sudden stop of blood flow to an arm or leg. This is a medical emergency. What are the causes? The most common cause of PVD is a buildup of a fatty substance (plaque) inside your arteries. This decreases blood flow. Plaque can break off and block blood in a smaller artery. This can lead to acute limb ischemia. Other common causes of PVD include:  Blood clots inside the blood vessels.  Injuries to blood vessels.  Irritation and swelling of blood vessels.  Sudden tightening of the blood vessel (spasms). What increases the risk?  A family history of PVD.  Medical conditions, including: ? High cholesterol. ? Diabetes. ? High blood pressure. ? Heart disease. ? Past problems with blood clots. ? Past injury, such as burns or a broken bone.  Other conditions, such as: ? Buerger's disease. This is caused by swollen or irritated blood vessels in your hands and feet. ? Arthritis. ? Birth defects that affect the arteries in your legs. ? Kidney disease.  Using tobacco or nicotine products.  Not getting enough exercise.  Being very overweight (obese).  Being 50 years old or older. What are the signs or symptoms?  Cramps in your butt, legs, and feet.  Pain and weakness in your legs when you are active that goes away when you rest.  Leg pain when at rest.  Leg numbness, tingling, or weakness.  Coldness in a leg or foot, especially when compared with the other leg or foot.  Skin or hair changes. These can include: ? Hair loss. ? Shiny skin. ? Pale or bluish skin. ? Thick toenails.  Being unable to get or keep an  erection.  Tiredness (fatigue).  Weak pulse or no pulse in the feet.  Wounds and sores on the toes, feet, or legs. These take longer to heal. How is this treated? Underlying causes are treated first. Other conditions, like diabetes, high cholesterol, and blood pressure, are also treated. Treatment may include:  Lifestyle changes, such as: ? Quitting smoking. ? Getting regular exercise. ? Having a diet low in fat and cholesterol. ? Not drinking alcohol.  Taking medicines, such as: ? Blood thinners. ? Medicines to improve blood flow. ? Medicines to improve your blood cholesterol.  Procedures to: ? Open the arteries and restore blood flow. ? Insert a small mesh tube (stent) to keep a blocked vessel open. ? Create a new path for blood to flow to the body (peripheral bypass). ? Remove dead tissue from a wound. ? Remove an affected leg or arm. Follow these instructions at home: Medicines  Take over-the-counter and prescription medicines only as told by your doctor.  If you are taking blood thinners: ? Talk with your doctor before you take any medicines that have aspirin, or NSAIDs, such as ibuprofen. ? Take medicines exactly as told. Take them at the same time each day. ? Avoid doing things that could hurt or bruise you. Take action to prevent falls. ? Wear an alert bracelet or carry a card that shows you are taking blood thinners. Lifestyle  Get regular exercise. Ask your doctor about how to stay active.    Talk with your doctor about keeping a healthy weight. If needed, ask about losing weight.  Eat a diet that is low in fat and cholesterol. If you need help, talk with your doctor.  Do not drink alcohol.  Do not smoke or use any products that contain nicotine or tobacco. If you need help quitting, ask your doctor.      General instructions  Take good care of your feet. To do this: ? Wear shoes that fit well and feel good. ? Check your feet often for any cuts or  sores.  Get a flu shot (influenza vaccine) each year.  Keep all follow-up visits. Where to find more information  Society for Vascular Surgery: vascular.org  American Heart Association: heart.org  National Heart, Lung, and Blood Institute: nhlbi.nih.gov Contact a doctor if:  You have cramps in your legs when you walk.  You have leg pain when you rest.  Your leg or foot feels cold.  Your skin changes.  You cannot get or keep an erection.  You have cuts or sores on your legs or feet that do not heal. Get help right away if:  You have sudden changes in the color and feeling of your arms or legs, such as: ? Your arm or leg turns cold, numb, and blue. ? Your arm or leg becomes red, warm, swollen, painful, or numb.  You have any signs of a stroke. "BE FAST" is an easy way to remember the main warning signs: ? B - Balance. Dizziness, sudden trouble walking, or loss of balance. ? E - Eyes. Trouble seeing or a change in how you see. ? F - Face. Sudden weakness or loss of feeling of the face. The face or eyelid may droop on one side. ? A - Arms. Weakness or loss of feeling in an arm. This happens all of a sudden and most often on one side of the body. ? S - Speech. Sudden trouble speaking, slurred speech, or trouble understanding what people say. ? T - Time. Time to call emergency services. Write down what time symptoms started.  You have other signs of a stroke, such as: ? A sudden, very bad headache with no known cause. ? Feeling like you may vomit (nausea). ? Vomiting. ? A seizure.  You have chest pain or trouble breathing. These symptoms may be an emergency. Get help right away. Call your local emergency services (911 in the [place]  Do not wait to see if the symptoms will go away.  Do not drive yourself to the hospital. Summary  Peripheral vascular disease (PVD) is a disease of the blood vessels.  PVD affects the legs and feet the most.  Symptoms may include leg  pain or leg numbness, tingling, and weakness.  Treatment may include lifestyle changes, medicines, and procedures. This information is not intended to replace advice given to you by your health care provider. Make sure you discuss any questions you have with your health care provider. Document Revised: 01/25/2020 Document Reviewed: 01/25/2020 Elsevier Patient Education  2021 [employer]vier Inc.  

## 2020-10-28 NOTE — Assessment & Plan Note (Signed)
lipid control important in reducing the progression of atherosclerotic disease. Continue statin therapy  

## 2020-10-28 NOTE — Telephone Encounter (Signed)
Patient was seen in office and scheduled for a left leg angio with Dr. Wyn Quaker on 10/31/20 with a 12:15 pm arrival to the [facility]. Covid testing is today (10/28/20) patient will go after leaving the office. Pre-procedure instructions were discussed and handed to the patient.

## 2020-10-28 NOTE — Assessment & Plan Note (Signed)
To evaluate his perfusion noninvasive studies were performed today.  ABIs were falsely elevated from medial calcification but his right digital pressure was only mildly reduced but his left digital pressure was markedly reduced.  His waveforms were fairly poor worse on the left than the right.    Recommend:  The patient has evidence of severe atherosclerotic changes of both lower extremities associated with ulceration and tissue loss of the foot.  This represents a limb threatening ischemia and places the patient at the risk for limb loss.  Patient should undergo angiography of the lower extremities with the hope for intervention for limb salvage.  The risks and benefits as well as the alternative therapies was discussed in detail with the patient.  All questions were answered.  Patient agrees to proceed with angiography.  The patient will follow up with me in the office after the procedure.

## 2020-10-28 NOTE — Assessment & Plan Note (Signed)
blood glucose control important in reducing the progression of atherosclerotic disease. Also, involved in wound healing. On appropriate medications.  His neuropathy is quite severe.

## 2020-10-28 NOTE — H&P (View-Only) (Signed)
Patient ID: Danny Organ Sr., male   DOB: January 06, 1951, 70 y.o.   MRN: [id number]  Chief Complaint  Patient presents with  . New Patient (Initial Visit)    [name]. PVD left toe gangrene Raford Pitcher. ABI    HPI Danny HARTEL Sr. is a 70 y.o. male.  I am asked to see the patient by Dr. [name] for evaluation of a gangrenous left great toe.  He was seen almost 3 years ago and noted to have significant peripheral arterial disease at that time.  He was unable to get anything done due to caring for an ailing mother and having multiple other issues at that point.  He has a litany of medical issues as listed below and significant heart disease.  Over the past several weeks, his left great toe has turned purple and cold.  There is been no clear cause or inciting event.  Nothing is really made it better.  He has such significant neuropathy in his feet that is not that painful for him.  His ambulation is somewhat limited.  He denies any fever chills or signs of systemic infection.  To evaluate his perfusion noninvasive studies were performed today.  ABIs were falsely elevated from medial calcification but his right digital pressure was only mildly reduced but his left digital pressure was markedly reduced.  His waveforms were fairly poor worse on the left than the right.     Past Medical History:  Diagnosis Date  . CAD (coronary artery disease)    a. 01/2010 PCI of LAD; b. 09/2014 PCI/DES of mLAD due to ISR. D1 80, D2 80(jailed), LCX 60m c. 07/2016 NSTEMI/Cath: LM nl, LAD 232mSR, 40d, D1 90ost, 80p, D2 90ost, RI min irregs, LCX min irregs, OM1 90 small, OM2/3 min irregs, RCA min irregs, RPLB1 90, EF 25-35%.  . Chronic combined systolic and diastolic CHF (congestive heart failure) ([facility] [place]   a. 07/2016 Echo: EF 30%, severe septal/anterior HK, Gr1 DD.  . Marland KitchenKD (chronic kidney disease), stage III ([facility]  . COPD (chronic obstructive pulmonary disease) ([facility]  . DM2 (diabetes mellitus, type 2) ([facility] [place]  . Erectile  dysfunction   . HLD (hyperlipidemia)   . HTN (hypertension)   . Ischemic cardiomyopathy    a. 07/2016 Echo: EF 30% w/ sev septal/ant HK. Gr1 DD.    Past Surgical History:  Procedure Laterality Date  . CAD: stent to the LAD    . CARDIAC CATHETERIZATION  10/01/2014  . CARDIAC CATHETERIZATION N/A 07/23/2016   Procedure: Left Heart Cath and Coronary Angiography;  Surgeon: MuWellington HampshireMD;  [facility];  Service: Cardiovascular;  Laterality: N/A;  . CORONARY ANGIOPLASTY WITH STENT PLACEMENT  10/01/2014     Family History  Problem Relation Age of Onset  . Heart attack Father        complications  . Cancer Brother   No bleeding disorders, clotting disorders, or aneurysms   Social History   Tobacco Use  . Smoking status: Former Smoker    Packs/day: 3.00    Years: 0.00    Pack years: 0.00    Types: Cigarettes    Quit date: 09/07/2009    Years since quitting: 11.1  . Smokeless tobacco: Never Used  . Tobacco comment: quit june 2011  Substance Use Topics  . Alcohol use: Yes    Comment: every other weekend  . Drug use: No    No Known Allergies  Current Outpatient Medications  Medication Sig Dispense Refill  . ACCU-CHEK GUIDE test strip CHECK CBGS 4 TIMES A DAY 300 strip 1  . albuterol (PROVENTIL) (2.5 MG/3ML) 0.083% nebulizer solution Take 3 mLs (2.5 mg total) by nebulization every 6 (six) hours as needed for wheezing or shortness of breath. 150 mL 1  . amoxicillin-clavulanate (AUGMENTIN) 875-125 MG tablet Take 1 tablet by mouth 2 (two) times daily.    [name] aspirin 81 MG tablet Take 81 mg by mouth daily.    [name] atorvastatin (LIPITOR) 80 MG tablet Take 1 tablet (80 mg total) by mouth daily. 90 tablet 3  . baclofen (LIORESAL) 10 MG tablet TAKE 1 TABLET BY MOUTH THREE TIMES A DAY AS NEEDED FOR MUSCLE SPASMS 30 tablet 0  . blood glucose meter kit and supplies KIT Dispense based on patient and insurance preference. Check CBGs 4 times daily. Dx code E11.9. 1 each 0   . carvedilol (COREG) 12.5 MG tablet TAKE 1 TABLET (12.5 MG TOTAL) BY MOUTH 2 (TWO) TIMES DAILY WITH A MEAL. 180 tablet 3  . clopidogrel (PLAVIX) 75 MG tablet TAKE 1 TABLET BY MOUTH EVERY DAY 90 tablet 3  . Dulaglutide (TRULICITY) 4.5 SA/6.3KZ SOPN Inject 4.5 mg as directed once a week. 6 mL 3  . empagliflozin (JARDIANCE) 25 MG TABS tablet Take 25 mg by mouth daily. 90 tablet 3  . escitalopram (LEXAPRO) 10 MG tablet TAKE 1 TABLET BY MOUTH EVERY DAY 90 tablet 1  . ezetimibe (ZETIA) 10 MG tablet TAKE 1 TABLET BY MOUTH EVERY DAY 90 tablet 3  . furosemide (LASIX) 20 MG tablet TAKE 1 TABLET (20 MG) BY MOUTH ONCE EVERY OTHER DAY 45 tablet 3  . insulin degludec (TRESIBA FLEXTOUCH) 100 UNIT/ML FlexTouch Pen Inject 40 Units into the skin daily. 45 mL 3  . Insulin Pen Needle (PEN NEEDLES) 32G X 6 MM MISC 1 each by Does not apply route 2 (two) times daily. 100 each 11  . isosorbide mononitrate (IMDUR) 30 MG 24 hr tablet TAKE 1 TABLET BY MOUTH EVERY DAY 90 tablet 3  . lisinopril (ZESTRIL) 2.5 MG tablet Take 1 tablet (2.5 mg total) by mouth daily. 90 tablet 3  . metFORMIN (GLUCOPHAGE-XR) 500 MG 24 hr tablet TAKE 1 TABLET BY MOUTH TWICE A DAY 180 tablet 3  . nitroGLYCERIN (NITROSTAT) 0.4 MG SL tablet Place 1 tablet (0.4 mg total) under the tongue every 5 (five) minutes as needed for chest pain. Maximum of 3 doses. 25 tablet 3  . potassium chloride (MICRO-K) 10 MEQ CR capsule TAKE 1 CAPSULE (10 MEQ) BY MOUTH ONCE EVERY OTHER DAY WITH LASIX (FUROSEMIDE) 45 capsule 3  . tamsulosin (FLOMAX) 0.4 MG CAPS capsule TAKE 1 CAPSULE BY MOUTH EVERY DAY 90 capsule 0  . VENTOLIN HFA 108 (90 Base) MCG/ACT inhaler Inhale 2 puffs into the lungs every 6 (six) hours as needed for wheezing or shortness of breath. 18 g 3   No current facility-administered medications for this visit.      REVIEW OF SYSTEMS (Negative unless checked)  Constitutional: _0 Weight loss  _1 Fever  _2 Chills Cardiac: _3 Chest pain   _4 Chest pressure    _5 Palpitations   _6 Shortness of breath when laying flat   _7 Shortness of breath at rest   _8 Shortness of breath with exertion. Vascular:  _9 Pain in legs with walking   _10 Pain in legs at rest   _11 Pain in legs when laying flat   _12 Claudication   _13 Pain in feet when walking  _14 Pain in feet at rest  _15 Pain in  feet when laying flat   _0 History of DVT   _1 Phlebitis   _2 Swelling in legs   _3 Varicose veins   _4 Non-healing ulcers Pulmonary:   _5 Uses home oxygen   _6 Productive cough   _7 Hemoptysis   _8 Wheeze  _9 COPD   _10 Asthma Neurologic:  _11 Dizziness  _12 Blackouts   _13 Seizures   _14 History of stroke   _15 History of TIA  _16 Aphasia   _17 Temporary blindness   _18 Dysphagia   _19 Weakness or numbness in arms   _20 Weakness or numbness in legs Musculoskeletal:  _21 Arthritis   _22 Joint swelling   _23 Joint pain   _24 Low back pain Hematologic:  _25 Easy bruising  _26 Easy bleeding   _27 Hypercoagulable state   _28 Anemic  _29 Hepatitis Gastrointestinal:  _30 Blood in stool   _31 Vomiting blood  _32 Gastroesophageal reflux/heartburn   _33 Abdominal pain Genitourinary:  _34 Chronic kidney disease   _35 Difficult urination  _36 Frequent urination  _37 Burning with urination   _38 Hematuria Skin:  _39 Rashes   _40 Ulcers   _41 Wounds Psychological:  _42 History of anxiety   _43  History of major depression.    Physical Exam BP (!) 166/108   Pulse (!) 112   Ht 6' (1.829 m)   Wt 228 lb (103.4 kg)   BMI 30.92 kg/m  Gen:  WD/WN, NAD.  Appears older than stated age Head: [place]/AT, No temporalis wasting.  Ear/Nose/Throat: Hearing grossly intact, nares w/o erythema or drainage, oropharynx w/o Erythema/Exudate Eyes: Conjunctiva clear, sclera non-icteric  Neck: trachea midline.  No JVD.  Pulmonary:  Good air movement, respirations not labored, no use of accessory muscles  Cardiac: Tachycardic and somewhat irregular Vascular:  Vessel Right Left  Radial Palpable Palpable                          DP  1+  trace  PT  not palpable  not palpable    Gastrointestinal:. No masses, surgical incisions, or scars. Musculoskeletal: M/S 5/5 throughout.  Left great toe is purple/blue and cold.  Clearly gangrenous.  Mild erythema in the forefoot below the toe.  1+ right lower extremity edema, 1-2+ left lower extremity Neurologic: Sensation grossly intact in extremities.  Symmetrical.  Speech is fluent. Motor exam as listed above. Psychiatric: Judgment intact, Mood & affect appropriate for pt's clinical situation. Dermatologic: Left great toe gangrene as above    Radiology No results found.  Labs Recent Results (from the past 2160 hour(s))  HgB A1c     Status: Abnormal   Collection Time: 09/30/20 11:23 AM  Result Value Ref Range   Hgb A1c MFr Bld 8.4 (H) 4.6 - 6.5 %    Comment: Glycemic Control Guidelines for People with Diabetes:Non Diabetic:  <6%Goal of Therapy: <7%Additional Action Suggested:  >5%   Basic Metabolic Panel (BMET)     Status: Abnormal   Collection Time: 09/30/20 11:23 AM  Result Value Ref Range   Sodium 138 135 - 145 mEq/L   Potassium 4.2 3.5 - 5.1 mEq/L   Chloride 104 96 - 112 mEq/L   CO2 27 19 - 32 mEq/L   Glucose, Bld 80 70 - 99 mg/dL   BUN 29 (H) 6 - 23 mg/dL   Creatinine, Ser 1.32 0.40 - 1.50 mg/dL   GFR 54.97 (L) >60.00 mL/min    Comment: Calculated using the CKD-EPI Creatinine Equation (2021)   Calcium 9.4 8.4 - 10.5 mg/dL  B Nat Peptide     Status: None   Collection Time: 10/26/20 10:48 AM  Result Value Ref Range   B Natriuretic Peptide 94.0  0.0 - 100.0 pg/mL    Comment: Performed at [facility], [address] [place], [place] [address]  CBC     Status: None   Collection Time: 10/26/20 10:48 AM  Result Value Ref Range   WBC 10.5 4.0 - 10.5 K/uL   RBC 5.13 4.22 - 5.81 MIL/uL   Hemoglobin 15.4 13.0 - 17.0 g/dL   HCT 48.7 39.0 - 52.0 %   MCV 94.9 80.0 - 100.0 fL   MCH 30.0 26.0 - 34.0 pg   MCHC 31.6 30.0 - 36.0 g/dL   RDW 12.2 11.5 - 15.5 %   Platelets 342 150 - 400 K/uL   nRBC 0.0 0.0  - 0.2 %    Comment: Performed at [facility], [address] [place], [place] [address]  Hepatic function panel     Status: None   Collection Time: 10/26/20 10:48 AM  Result Value Ref Range   Total Protein 7.3 6.5 - 8.1 g/dL   Albumin 3.8 3.5 - 5.0 g/dL   AST 16 15 - 41 U/L   ALT 24 0 - 44 U/L   Alkaline Phosphatase 94 38 - 126 U/L   Total Bilirubin 0.6 0.3 - 1.2 mg/dL   Bilirubin, Direct <0.1 0.0 - 0.2 mg/dL   Indirect Bilirubin NOT CALCULATED 0.3 - 0.9 mg/dL    Comment: Performed at [facility], [address] [place], [place] [address]  Lipid Profile     Status: None   Collection Time: 10/26/20 10:48 AM  Result Value Ref Range   Cholesterol 96 0 - 200 mg/dL   Triglycerides 60 <150 mg/dL   HDL 46 >40 mg/dL   Total CHOL/HDL Ratio 2.1 RATIO   VLDL 12 0 - 40 mg/dL   LDL Cholesterol 38 0 - 99 mg/dL    Comment:        Total Cholesterol/HDL:CHD Risk Coronary Heart Disease Risk Table                     Men   Women  1/2 Average Risk   3.4   3.3  Average Risk       5.0   4.4  2 X Average Risk   9.6   7.1  3 X Average Risk  23.4   11.0        Use the calculated Patient Ratio above and the CHD Risk Table to determine the patient's CHD Risk.        ATP III CLASSIFICATION (LDL):  <100     mg/dL   Optimal  100-129  mg/dL   Near or Above                    Optimal  130-159  mg/dL   Borderline  160-189  mg/dL   High  >190     mg/dL   Very High Performed at  Hospital Lab, [address] [place], [place] [address]     Assessment/Plan:  Atherosclerosis of native arteries of the extremities with gangrene (HCC) To evaluate his perfusion noninvasive studies were performed today.  ABIs were falsely elevated from medial calcification but his right digital pressure was only mildly reduced but his left digital pressure was markedly reduced.  His waveforms were fairly poor worse on the left than the right.    Recommend:  The patient has evidence of  severe atherosclerotic changes of both lower extremities associated with ulceration and tissue loss of the foot.  This   represents a limb threatening ischemia and places the patient at the risk for limb loss.  Patient should undergo angiography of the lower extremities with the hope for intervention for limb salvage.  The risks and benefits as well as the alternative therapies was discussed in detail with the patient.  All questions were answered.  Patient agrees to proceed with angiography.  The patient will follow up with me in the office after the procedure.    Essential hypertension blood pressure control important in reducing the progression of atherosclerotic disease. On appropriate oral medications.   Diabetes mellitus type 2, uncontrolled, with complications (HCC) blood glucose control important in reducing the progression of atherosclerotic disease. Also, involved in wound healing. On appropriate medications.   Diabetic neuropathy (HCC) blood glucose control important in reducing the progression of atherosclerotic disease. Also, involved in wound healing. On appropriate medications.  His neuropathy is quite severe.   Hyperlipidemia lipid control important in reducing the progression of atherosclerotic disease. Continue statin therapy       Leotis Pain 10/28/2020, 1:33 PM   This note was created with Dragon medical transcription system.  Any errors from dictation are unintentional.

## 2020-10-28 NOTE — Assessment & Plan Note (Signed)
blood pressure control important in reducing the progression of atherosclerotic disease. On appropriate oral medications.  

## 2020-10-28 NOTE — Assessment & Plan Note (Signed)
blood glucose control important in reducing the progression of atherosclerotic disease. Also, involved in wound healing. On appropriate medications.  

## 2020-10-29 LAB — SARS CORONAVIRUS 2 (TAT 6-24 HRS): SARS Coronavirus 2: NEGATIVE

## 2020-10-30 ENCOUNTER — Other Ambulatory Visit (INDEPENDENT_AMBULATORY_CARE_PROVIDER_SITE_OTHER): Payer: Self-pay | Admitting: Nurse Practitioner

## 2020-10-31 ENCOUNTER — Other Ambulatory Visit: Payer: Self-pay

## 2020-10-31 ENCOUNTER — Encounter: Payer: Self-pay | Admitting: Vascular Surgery

## 2020-10-31 ENCOUNTER — Ambulatory Visit
Admission: RE | Admit: 2020-10-31 | Discharge: 2020-10-31 | Disposition: A | Discharge status: Home / Self Care | Payer: 59 | Attending: Vascular Surgery | Admitting: Vascular Surgery

## 2020-10-31 ENCOUNTER — Encounter
Admission: RE | Disposition: A | Discharge status: Home / Self Care | Payer: Self-pay | Source: Home / Self Care | Attending: Vascular Surgery

## 2020-10-31 DIAGNOSIS — L97528 Non-pressure chronic ulcer of other part of left foot with other specified severity: Secondary | ICD-10-CM | POA: Insufficient documentation

## 2020-10-31 DIAGNOSIS — Z8249 Family history of ischemic heart disease and other diseases of the circulatory system: Secondary | ICD-10-CM | POA: Diagnosis not present

## 2020-10-31 DIAGNOSIS — Z7984 Long term (current) use of oral hypoglycemic drugs: Secondary | ICD-10-CM | POA: Diagnosis not present

## 2020-10-31 DIAGNOSIS — I255 Ischemic cardiomyopathy: Secondary | ICD-10-CM | POA: Diagnosis not present

## 2020-10-31 DIAGNOSIS — E785 Hyperlipidemia, unspecified: Secondary | ICD-10-CM | POA: Insufficient documentation

## 2020-10-31 DIAGNOSIS — Z794 Long term (current) use of insulin: Secondary | ICD-10-CM | POA: Diagnosis not present

## 2020-10-31 DIAGNOSIS — N183 Chronic kidney disease, stage 3 unspecified: Secondary | ICD-10-CM | POA: Insufficient documentation

## 2020-10-31 DIAGNOSIS — Z955 Presence of coronary angioplasty implant and graft: Secondary | ICD-10-CM | POA: Diagnosis not present

## 2020-10-31 DIAGNOSIS — I5042 Chronic combined systolic (congestive) and diastolic (congestive) heart failure: Secondary | ICD-10-CM | POA: Diagnosis not present

## 2020-10-31 DIAGNOSIS — E11621 Type 2 diabetes mellitus with foot ulcer: Secondary | ICD-10-CM | POA: Diagnosis not present

## 2020-10-31 DIAGNOSIS — Z79899 Other long term (current) drug therapy: Secondary | ICD-10-CM | POA: Insufficient documentation

## 2020-10-31 DIAGNOSIS — E1152 Type 2 diabetes mellitus with diabetic peripheral angiopathy with gangrene: Secondary | ICD-10-CM | POA: Diagnosis not present

## 2020-10-31 DIAGNOSIS — E1122 Type 2 diabetes mellitus with diabetic chronic kidney disease: Secondary | ICD-10-CM | POA: Diagnosis not present

## 2020-10-31 DIAGNOSIS — I2510 Atherosclerotic heart disease of native coronary artery without angina pectoris: Secondary | ICD-10-CM | POA: Diagnosis not present

## 2020-10-31 DIAGNOSIS — I130 Hypertensive heart and chronic kidney disease with heart failure and stage 1 through stage 4 chronic kidney disease, or unspecified chronic kidney disease: Secondary | ICD-10-CM | POA: Insufficient documentation

## 2020-10-31 DIAGNOSIS — Z7982 Long term (current) use of aspirin: Secondary | ICD-10-CM | POA: Diagnosis not present

## 2020-10-31 DIAGNOSIS — I70262 Atherosclerosis of native arteries of extremities with gangrene, left leg: Secondary | ICD-10-CM | POA: Diagnosis not present

## 2020-10-31 DIAGNOSIS — Z87891 Personal history of nicotine dependence: Secondary | ICD-10-CM | POA: Diagnosis not present

## 2020-10-31 DIAGNOSIS — I70219 Atherosclerosis of native arteries of extremities with intermittent claudication, unspecified extremity: Secondary | ICD-10-CM

## 2020-10-31 DIAGNOSIS — Z7902 Long term (current) use of antithrombotics/antiplatelets: Secondary | ICD-10-CM | POA: Diagnosis not present

## 2020-10-31 HISTORY — PX: LOWER EXTREMITY ANGIOGRAPHY: CATH118251

## 2020-10-31 LAB — GLUCOSE, CAPILLARY
Glucose-Capillary: 111 mg/dL — ABNORMAL HIGH (ref 70–99)
Glucose-Capillary: 111 mg/dL — ABNORMAL HIGH (ref 70–99)

## 2020-10-31 SURGERY — LOWER EXTREMITY ANGIOGRAPHY
Anesthesia: Moderate Sedation | Site: Leg Lower | Laterality: Left

## 2020-10-31 MED ORDER — HEPARIN SODIUM (PORCINE) 1000 UNIT/ML IJ SOLN
INTRAMUSCULAR | Status: AC
  Filled 2020-10-31: qty 1

## 2020-10-31 MED ORDER — CEFAZOLIN SODIUM-DEXTROSE 2-4 GM/100ML-% IV SOLN
2.0000 g | Freq: Once | INTRAVENOUS | Status: DC
  Filled 2020-10-31: qty 100

## 2020-10-31 MED ORDER — IODIXANOL 320 MG/ML IV SOLN
INTRAVENOUS | Status: DC | PRN
  Administered 2020-10-31: 70 mL via INTRA_ARTERIAL

## 2020-10-31 MED ORDER — ONDANSETRON HCL 4 MG/2ML IJ SOLN: 4.0000 mg | Freq: Four times a day (QID) | INTRAMUSCULAR | Status: DC | PRN

## 2020-10-31 MED ORDER — MIDAZOLAM HCL 2 MG/2ML IJ SOLN
INTRAMUSCULAR | Status: DC | PRN
  Administered 2020-10-31 (×3): 1 mg via INTRAVENOUS
  Administered 2020-10-31: 2 mg via INTRAVENOUS

## 2020-10-31 MED ORDER — METHYLPREDNISOLONE SODIUM SUCC 125 MG IJ SOLR: 125.0000 mg | Freq: Once | INTRAMUSCULAR | Status: DC | PRN

## 2020-10-31 MED ORDER — HYDROMORPHONE HCL 1 MG/ML IJ SOLN
INTRAMUSCULAR | Status: AC
  Filled 2020-10-31: qty 1

## 2020-10-31 MED ORDER — MIDAZOLAM HCL 5 MG/5ML IJ SOLN
INTRAMUSCULAR | Status: AC
  Filled 2020-10-31: qty 5

## 2020-10-31 MED ORDER — FENTANYL CITRATE (PF) 100 MCG/2ML IJ SOLN
INTRAMUSCULAR | Status: AC
  Filled 2020-10-31: qty 2

## 2020-10-31 MED ORDER — SODIUM CHLORIDE 0.9 % IV SOLN: INTRAVENOUS | Status: DC

## 2020-10-31 MED ORDER — HEPARIN SODIUM (PORCINE) 1000 UNIT/ML IJ SOLN
INTRAMUSCULAR | Status: DC | PRN
  Administered 2020-10-31: 5000 [IU] via INTRAVENOUS

## 2020-10-31 MED ORDER — MIDAZOLAM HCL 2 MG/ML PO SYRP: 8.0000 mg | ORAL_SOLUTION | Freq: Once | ORAL | Status: DC | PRN

## 2020-10-31 MED ORDER — FENTANYL CITRATE (PF) 100 MCG/2ML IJ SOLN
INTRAMUSCULAR | Status: DC | PRN
  Administered 2020-10-31: 50 ug via INTRAVENOUS
  Administered 2020-10-31 (×2): 25 ug via INTRAVENOUS

## 2020-10-31 MED ORDER — FAMOTIDINE 20 MG PO TABS: 40.0000 mg | ORAL_TABLET | Freq: Once | ORAL | Status: DC | PRN

## 2020-10-31 MED ORDER — HYDROMORPHONE HCL 1 MG/ML IJ SOLN
1.0000 mg | Freq: Once | INTRAMUSCULAR | Status: AC | PRN
Start: 2020-10-31 — End: 2020-10-31
  Administered 2020-10-31: 1 mg via INTRAVENOUS

## 2020-10-31 MED ORDER — DIPHENHYDRAMINE HCL 50 MG/ML IJ SOLN: 50.0000 mg | Freq: Once | INTRAMUSCULAR | Status: DC | PRN

## 2020-10-31 SURGICAL SUPPLY — 24 items
BALLN LUTONIX 018 6X100X130 (BALLOONS) ×2
BALLN LUTONIX 018 6X60X130 (BALLOONS) ×2
BALLN ULTRVRSE 2.5X220X150 (BALLOONS) ×2
BALLN ULTRVRSE 2X100X150 (BALLOONS) ×2
BALLN ULTRVRSE 3X220X150 (BALLOONS) ×2
BALLOON LUTONIX 018 6X100X130 (BALLOONS) ×1 IMPLANT
BALLOON LUTONIX 018 6X60X130 (BALLOONS) ×1 IMPLANT
BALLOON ULTRVRSE 2.5X220X150 (BALLOONS) ×1 IMPLANT
BALLOON ULTRVRSE 2X100X150 (BALLOONS) ×1 IMPLANT
BALLOON ULTRVRSE 3X220X150 (BALLOONS) ×1 IMPLANT
CATH ANGIO 5F PIGTAIL 65CM (CATHETERS) ×2 IMPLANT
CATH BEACON 5 .038 100 VERT TP (CATHETERS) ×2 IMPLANT
CATH NAVICROSS ANGLED 135CM (MICROCATHETER) ×2 IMPLANT
DEVICE STARCLOSE SE CLOSURE (Vascular Products) ×2 IMPLANT
GLIDEWIRE ADV .035X260CM (WIRE) ×2 IMPLANT
KIT ENCORE 26 ADVANTAGE (KITS) ×2 IMPLANT
PACK ANGIOGRAPHY (CUSTOM PROCEDURE TRAY) ×2 IMPLANT
SHEATH ANL2 6FRX45 HC (SHEATH) ×2 IMPLANT
SHEATH BRITE TIP 5FRX11 (SHEATH) ×2 IMPLANT
STENT LIFESTENT 5F 7X60X135 (Permanent Stent) ×2 IMPLANT
SYR MEDRAD MARK 7 150ML (SYRINGE) ×2 IMPLANT
TUBING CONTRAST HIGH PRESS 72 (TUBING) ×2 IMPLANT
WIRE G V18X300CM (WIRE) ×2 IMPLANT
WIRE GUIDERIGHT .035X150 (WIRE) ×2 IMPLANT

## 2020-10-31 NOTE — Op Note (Signed)
[facility]  Percutaneous Study/Intervention Procedural Note   Date of Surgery: 10/31/2020  Surgeon(s):Dallys Nowakowski    Assistants:none  Pre-operative Diagnosis: PAD with gangrene left foot  Post-operative diagnosis:  Same  Procedure(s) Performed:             1.  Ultrasound guidance for vascular access right femoral artery             2.  Catheter placement into left common femoral artery from right femoral approach             3.  Aortogram and selective left lower extremity angiogram             4.  Percutaneous transluminal angioplasty of left peroneal artery with 3 mm diameter angioplasty balloon             5.   Percutaneous transluminal angioplasty of left posterior tibial artery with 2.5 mm diameter angioplasty balloon  6.  Percutaneous transluminal angioplasty of the left SFA with 6 mm diameter Lutonix drug-coated angioplasty balloon  7.  Stent placement to the proximal left SFA with 7 mm diameter by 6 cm length life stent for greater than 50% residual stenosis after angioplasty             8.  StarClose closure device right femoral artery  EBL: 10 cc  Contrast: 70 cc  Fluoro Time: 8 minutes  Moderate Conscious Sedation Time: approximately 49 minutes using 5 mg of Versed and 100 mcg of Fentanyl              Indications:  Patient is a 70 y.o.male with a gangrenous left great toe and some ischemia of the forefoot.  He had known previous disease seen on noninvasive studies before but did not have revascularization at that time a couple of years ago. The patient has noninvasive study showing Doose perfusion bilaterally. The patient is brought in for angiography for further evaluation and potential treatment.  Due to the limb threatening nature of the situation, angiogram was performed for attempted limb salvage. The patient is aware that if the procedure fails, amputation would be expected.  The patient also understands that even with successful  revascularization, amputation may still be required due to the severity of the situation.  Risks and benefits are discussed and informed consent is obtained.   Procedure:  The patient was identified and appropriate procedural time out was performed.  The patient was then placed supine on the table and prepped and draped in the usual sterile fashion. Moderate conscious sedation was administered during a face to face encounter with the patient throughout the procedure with my supervision of the RN administering medicines and monitoring the patient's vital signs, pulse oximetry, telemetry and mental status throughout from the start of the procedure until the patient was taken to the recovery room. Ultrasound was used to evaluate the right common femoral artery.  It was patent .  A digital ultrasound image was acquired.  A Seldinger needle was used to access the right common femoral artery under direct ultrasound guidance and a permanent image was performed.  A 0.035 J wire was advanced without resistance and a 5Fr sheath was placed.  Pigtail catheter was placed into the aorta and an AP aortogram was performed. This demonstrated normal right renal artery and mild disease of the left renal artery and normal aorta and iliac segments without significant stenosis. I then crossed the aortic bifurcation and advanced to the left femoral head. Selective left  lower extremity angiogram was then performed. This demonstrated fairly normal common femoral artery.  The profunda femoris artery was small and provided very poor collateral blood flow.  The SFA had about a 90% stenosis about 4 to 5 cm after its origin and then normalized and had mild disease throughout the remainder of the SFA and popliteal artery there was then severe disease in the tibial vessels.  The posterior tibial artery had multiple areas of occlusion but did have flow down to the distal lower leg but no flow in the foot.  The peroneal artery had a nearly  occlusive stenosis in the proximal segment but then normalized in the proximal to mid segment and was continuous distally.  The tibioperoneal trunk had greater than 80% stenosis.  The anterior tibial artery was occluded after several centimeters and did not reconstitute distally.. It was felt that it was in the patient's best interest to proceed with intervention after these images to avoid a second procedure and a larger amount of contrast and fluoroscopy based off of the findings from the initial angiogram. The patient was systemically heparinized and a 6 [place] Ansell sheath was then placed over the [employer] wire. I then used a Kumpe catheter and the advantage wire to navigate first through the SFA stenosis and then down to the tibioperoneal trunk where I exchanged for a Nava cross catheter and a V 18 wire and cross the peroneal artery stenosis without difficulty parking the wire in the ankle.  A 3 mm diameter by 22 cm length angioplasty balloon was inflated to 12 atm for 1 minute in the proximal peroneal artery and tibioperoneal trunk with completion imaging showing less than 20% residual stenosis.  I then turned my attention to the posterior tibial artery.  Is able to get a wire down to the distal posterior tibial artery and a 2.5 mm diameter by 22 cm length angioplasty balloon was inflated from the mid to distal posterior tibial artery up to the proximal posterior tibial artery and tibioperoneal trunk which was as far as I can get the balloon to cross.  Completion imaging showed this area to have less than 30% residual stenosis, but the vessel still terminated distally.  The hope was this would improve the collaterals down into the foot by improving more proximal portion of the posterior tibial artery.  The anterior tibial artery really did not have a target for revascularization.  I then turned my attention to the SFA lesion.  A 6 mm diameter by 10 cm length Lutonix drug-coated angioplasty balloon was  inflated to 10 atm for 1 minute in the proximal left SFA.  Completion imaging showed greater than 50% residual stenosis so a 7 mm diameter by 6 cm length life stent was then deployed in the proximal left SFA and postdilated with a 6 mm diameter Lutonix drug-coated angioplasty balloon.  Completion imaging showed only about a 10% residual stenosis after stent placed. I elected to terminate the procedure. The sheath was removed and StarClose closure device was deployed in the right femoral artery with excellent hemostatic result. The patient was taken to the recovery room in stable condition having tolerated the procedure well.  Findings:               Aortogram:  Aorta and iliac arteries with moderate tortuosity but no significant stenosis.  The right renal artery is widely patent.  The left renal artery had mild disease.  Left lower Extremity:  Fairly normal common femoral artery.  The profunda femoris artery was small and provided very poor collateral blood flow.  The SFA had about a 90% stenosis about 4 to 5 cm after its origin and then normalized and had mild disease throughout the remainder of the SFA and popliteal artery there was then severe disease in the tibial vessels.  The posterior tibial artery had multiple areas of occlusion but did have flow down to the distal lower leg but no flow in the foot.  The peroneal artery had a nearly occlusive stenosis in the proximal segment but then normalized in the proximal to mid segment and was continuous distally.  The tibioperoneal trunk had greater than 80% stenosis.  The anterior tibial artery was occluded after several centimeters and did not reconstitute distally.   Disposition: Patient was taken to the recovery room in stable condition having tolerated the procedure well.  Complications: None  Leotis Pain 10/31/2020 2:31 PM   This note was created with Dragon Medical transcription system. Any errors in dictation are purely unintentional.

## 2020-10-31 NOTE — Interval H&P Note (Signed)
History and Physical Interval Note:  10/31/2020 12:21 PM  Danny Friends Merica Sr.  has presented today for surgery, with the diagnosis of LLE Angiography   ASO with claudication   BARD Rep  cc: S Willey Pt to have Covid test on 3-25.  The various methods of treatment have been discussed with the patient and family. After consideration of risks, benefits and other options for treatment, the patient has consented to  Procedure(s): LOWER EXTREMITY ANGIOGRAPHY (Left) as a surgical intervention.  The patient's history has been reviewed, patient examined, no change in status, stable for surgery.  I have reviewed the patient's chart and labs.  Questions were answered to the patient's satisfaction.     Festus Barren

## 2020-11-01 ENCOUNTER — Telehealth: Payer: Self-pay | Admitting: *Deleted

## 2020-11-01 ENCOUNTER — Encounter: Payer: Self-pay | Admitting: Vascular Surgery

## 2020-11-01 NOTE — Telephone Encounter (Signed)
-----   Message from Lennon Alstrom, PA-C sent at 10/28/2020  1:28 PM EDT ----- Labs show --Liver function stable. --LDL at goal --Blood counts stable  Very reassuring labs. No medication changes at this time.

## 2020-11-01 NOTE — Telephone Encounter (Signed)
Spoke to pt. Notified of lab results.  Pt verbalized understanding.   He asks that we reschedule his echo and carotid US coming up on 4/19 d/t upcoming vascular surgery.  Told pt I would notify scheduling and they will call him back to reschedule.  Pt has no further questions / concerns at this time.

## 2020-11-04 LAB — HM DIABETES EYE EXAM

## 2020-11-06 ENCOUNTER — Inpatient Hospital Stay
Admission: EM | Admit: 2020-11-06 | Discharge: 2020-11-16 | DRG: 854 | Disposition: A | Discharge status: Skilled Nursing Facility | Payer: 59 | Attending: Family Medicine | Admitting: Family Medicine

## 2020-11-06 ENCOUNTER — Emergency Department: Payer: 59

## 2020-11-06 DIAGNOSIS — I739 Peripheral vascular disease, unspecified: Secondary | ICD-10-CM | POA: Diagnosis not present

## 2020-11-06 DIAGNOSIS — E785 Hyperlipidemia, unspecified: Secondary | ICD-10-CM | POA: Diagnosis present

## 2020-11-06 DIAGNOSIS — D75838 Other thrombocytosis: Secondary | ICD-10-CM

## 2020-11-06 DIAGNOSIS — E7800 Pure hypercholesterolemia, unspecified: Secondary | ICD-10-CM | POA: Diagnosis present

## 2020-11-06 DIAGNOSIS — E1165 Type 2 diabetes mellitus with hyperglycemia: Secondary | ICD-10-CM | POA: Diagnosis present

## 2020-11-06 DIAGNOSIS — IMO0002 Reserved for concepts with insufficient information to code with codable children: Secondary | ICD-10-CM

## 2020-11-06 DIAGNOSIS — N183 Chronic kidney disease, stage 3 unspecified: Secondary | ICD-10-CM | POA: Diagnosis present

## 2020-11-06 DIAGNOSIS — J449 Chronic obstructive pulmonary disease, unspecified: Secondary | ICD-10-CM | POA: Diagnosis present

## 2020-11-06 DIAGNOSIS — I96 Gangrene, not elsewhere classified: Secondary | ICD-10-CM | POA: Diagnosis not present

## 2020-11-06 DIAGNOSIS — E1152 Type 2 diabetes mellitus with diabetic peripheral angiopathy with gangrene: Secondary | ICD-10-CM | POA: Diagnosis present

## 2020-11-06 DIAGNOSIS — I2510 Atherosclerotic heart disease of native coronary artery without angina pectoris: Secondary | ICD-10-CM | POA: Diagnosis present

## 2020-11-06 DIAGNOSIS — Z7902 Long term (current) use of antithrombotics/antiplatelets: Secondary | ICD-10-CM | POA: Diagnosis not present

## 2020-11-06 DIAGNOSIS — I5042 Chronic combined systolic (congestive) and diastolic (congestive) heart failure: Secondary | ICD-10-CM | POA: Diagnosis present

## 2020-11-06 DIAGNOSIS — I130 Hypertensive heart and chronic kidney disease with heart failure and stage 1 through stage 4 chronic kidney disease, or unspecified chronic kidney disease: Secondary | ICD-10-CM | POA: Diagnosis present

## 2020-11-06 DIAGNOSIS — R0602 Shortness of breath: Secondary | ICD-10-CM

## 2020-11-06 DIAGNOSIS — A419 Sepsis, unspecified organism: Secondary | ICD-10-CM | POA: Diagnosis present

## 2020-11-06 DIAGNOSIS — Z79899 Other long term (current) drug therapy: Secondary | ICD-10-CM

## 2020-11-06 DIAGNOSIS — I10 Essential (primary) hypertension: Secondary | ICD-10-CM | POA: Diagnosis present

## 2020-11-06 DIAGNOSIS — N400 Enlarged prostate without lower urinary tract symptoms: Secondary | ICD-10-CM | POA: Diagnosis present

## 2020-11-06 DIAGNOSIS — R6520 Severe sepsis without septic shock: Secondary | ICD-10-CM | POA: Diagnosis present

## 2020-11-06 DIAGNOSIS — I5032 Chronic diastolic (congestive) heart failure: Secondary | ICD-10-CM

## 2020-11-06 DIAGNOSIS — R410 Disorientation, unspecified: Secondary | ICD-10-CM | POA: Diagnosis not present

## 2020-11-06 DIAGNOSIS — Z794 Long term (current) use of insulin: Secondary | ICD-10-CM

## 2020-11-06 DIAGNOSIS — J4489 Other specified chronic obstructive pulmonary disease: Secondary | ICD-10-CM

## 2020-11-06 DIAGNOSIS — E1169 Type 2 diabetes mellitus with other specified complication: Secondary | ICD-10-CM | POA: Diagnosis present

## 2020-11-06 DIAGNOSIS — Z8616 Personal history of COVID-19: Secondary | ICD-10-CM

## 2020-11-06 DIAGNOSIS — Z87891 Personal history of nicotine dependence: Secondary | ICD-10-CM

## 2020-11-06 DIAGNOSIS — Z7984 Long term (current) use of oral hypoglycemic drugs: Secondary | ICD-10-CM | POA: Diagnosis not present

## 2020-11-06 DIAGNOSIS — L03116 Cellulitis of left lower limb: Secondary | ICD-10-CM | POA: Diagnosis present

## 2020-11-06 DIAGNOSIS — Z955 Presence of coronary angioplasty implant and graft: Secondary | ICD-10-CM

## 2020-11-06 DIAGNOSIS — N182 Chronic kidney disease, stage 2 (mild): Secondary | ICD-10-CM

## 2020-11-06 DIAGNOSIS — Z20822 Contact with and (suspected) exposure to covid-19: Secondary | ICD-10-CM | POA: Diagnosis present

## 2020-11-06 DIAGNOSIS — Z01810 Encounter for preprocedural cardiovascular examination: Secondary | ICD-10-CM | POA: Diagnosis not present

## 2020-11-06 DIAGNOSIS — B370 Candidal stomatitis: Secondary | ICD-10-CM

## 2020-11-06 DIAGNOSIS — Z8249 Family history of ischemic heart disease and other diseases of the circulatory system: Secondary | ICD-10-CM

## 2020-11-06 DIAGNOSIS — E1122 Type 2 diabetes mellitus with diabetic chronic kidney disease: Secondary | ICD-10-CM | POA: Diagnosis present

## 2020-11-06 DIAGNOSIS — I5020 Unspecified systolic (congestive) heart failure: Secondary | ICD-10-CM

## 2020-11-06 DIAGNOSIS — I255 Ischemic cardiomyopathy: Secondary | ICD-10-CM | POA: Diagnosis present

## 2020-11-06 DIAGNOSIS — Z7982 Long term (current) use of aspirin: Secondary | ICD-10-CM

## 2020-11-06 DIAGNOSIS — I5022 Chronic systolic (congestive) heart failure: Secondary | ICD-10-CM | POA: Diagnosis not present

## 2020-11-06 LAB — CBC WITH DIFFERENTIAL/PLATELET
Abs Immature Granulocytes: 0.26 10*3/uL — ABNORMAL HIGH (ref 0.00–0.07)
Basophils Absolute: 0.1 10*3/uL (ref 0.0–0.1)
Basophils Relative: 0 %
Eosinophils Absolute: 0.1 10*3/uL (ref 0.0–0.5)
Eosinophils Relative: 0 %
HCT: 42.3 % (ref 39.0–52.0)
Hemoglobin: 13.5 g/dL (ref 13.0–17.0)
Immature Granulocytes: 1 %
Lymphocytes Relative: 5 %
Lymphs Abs: 1.3 10*3/uL (ref 0.7–4.0)
MCH: 29.7 pg (ref 26.0–34.0)
MCHC: 31.9 g/dL (ref 30.0–36.0)
MCV: 93.2 fL (ref 80.0–100.0)
Monocytes Absolute: 2.1 10*3/uL — ABNORMAL HIGH (ref 0.1–1.0)
Monocytes Relative: 9 %
Neutro Abs: 20.6 10*3/uL — ABNORMAL HIGH (ref 1.7–7.7)
Neutrophils Relative %: 85 %
Platelets: 407 10*3/uL — ABNORMAL HIGH (ref 150–400)
RBC: 4.54 MIL/uL (ref 4.22–5.81)
RDW: 12.7 % (ref 11.5–15.5)
WBC: 24.4 10*3/uL — ABNORMAL HIGH (ref 4.0–10.5)
nRBC: 0 % (ref 0.0–0.2)

## 2020-11-06 LAB — COMPREHENSIVE METABOLIC PANEL
ALT: 99 U/L — ABNORMAL HIGH (ref 0–44)
AST: 74 U/L — ABNORMAL HIGH (ref 15–41)
Albumin: 3 g/dL — ABNORMAL LOW (ref 3.5–5.0)
Alkaline Phosphatase: 129 U/L — ABNORMAL HIGH (ref 38–126)
Anion gap: 12 (ref 5–15)
BUN: 23 mg/dL (ref 8–23)
CO2: 21 mmol/L — ABNORMAL LOW (ref 22–32)
Calcium: 8.6 mg/dL — ABNORMAL LOW (ref 8.9–10.3)
Chloride: 102 mmol/L (ref 98–111)
Creatinine, Ser: 1.27 mg/dL — ABNORMAL HIGH (ref 0.61–1.24)
GFR, Estimated: >60 mL/min (ref >60)
Glucose, Bld: 212 mg/dL — ABNORMAL HIGH (ref 70–99)
Potassium: 3.9 mmol/L (ref 3.5–5.1)
Sodium: 135 mmol/L (ref 135–145)
Total Bilirubin: 0.8 mg/dL (ref 0.3–1.2)
Total Protein: 7 g/dL (ref 6.5–8.1)

## 2020-11-06 LAB — LACTIC ACID, PLASMA
Lactic Acid, Venous: 1.7 mmol/L (ref 0.5–1.9)
Lactic Acid, Venous: 1.1 mmol/L (ref 0.5–1.9)

## 2020-11-06 MED ORDER — VANCOMYCIN HCL IN DEXTROSE 1-5 GM/200ML-% IV SOLN
1000.0000 mg | Freq: Once | INTRAVENOUS | Status: AC
  Administered 2020-11-06: 1000 mg via INTRAVENOUS
  Filled 2020-11-06: qty 200

## 2020-11-06 MED ORDER — INSULIN GLARGINE 100 UNIT/ML ~~LOC~~ SOLN
20.0000 [IU] | Freq: Every day | SUBCUTANEOUS | Status: DC
  Administered 2020-11-07 (×2): 20 [IU] via SUBCUTANEOUS
  Filled 2020-11-06 (×2): qty 0.2

## 2020-11-06 MED ORDER — CARVEDILOL 6.25 MG PO TABS
12.5000 mg | ORAL_TABLET | Freq: Two times a day (BID) | ORAL | Status: DC
  Administered 2020-11-07: 12.5 mg via ORAL
  Filled 2020-11-06: qty 2

## 2020-11-06 MED ORDER — LISINOPRIL 5 MG PO TABS
2.5000 mg | ORAL_TABLET | Freq: Every day | ORAL | Status: DC
  Administered 2020-11-07 – 2020-11-08 (×2): 2.5 mg via ORAL
  Filled 2020-11-06 (×2): qty 1

## 2020-11-06 MED ORDER — HYDROMORPHONE HCL 1 MG/ML IJ SOLN
1.0000 mg | INTRAMUSCULAR | Status: DC | PRN
  Administered 2020-11-06: 1 mg via INTRAVENOUS
  Filled 2020-11-06: qty 1

## 2020-11-06 MED ORDER — SODIUM CHLORIDE 0.9 % IV SOLN
1.0000 g | Freq: Once | INTRAVENOUS | Status: AC
  Administered 2020-11-06: 1 g via INTRAVENOUS
  Filled 2020-11-06: qty 1

## 2020-11-06 MED ORDER — ESCITALOPRAM OXALATE 10 MG PO TABS
10.0000 mg | ORAL_TABLET | Freq: Every day | ORAL | Status: DC
  Administered 2020-11-07 – 2020-11-16 (×9): 10 mg via ORAL
  Filled 2020-11-06 (×10): qty 1

## 2020-11-06 MED ORDER — TAMSULOSIN HCL 0.4 MG PO CAPS
0.4000 mg | ORAL_CAPSULE | Freq: Every day | ORAL | Status: DC
  Administered 2020-11-07 – 2020-11-16 (×9): 0.4 mg via ORAL
  Filled 2020-11-06 (×9): qty 1

## 2020-11-06 MED ORDER — LACTATED RINGERS IV BOLUS
250.0000 mL | Freq: Once | INTRAVENOUS | Status: AC
  Administered 2020-11-06: 250 mL via INTRAVENOUS

## 2020-11-06 MED ORDER — LACTATED RINGERS IV SOLN: INTRAVENOUS | Status: DC

## 2020-11-06 MED ORDER — ONDANSETRON HCL 4 MG/2ML IJ SOLN
4.0000 mg | Freq: Four times a day (QID) | INTRAMUSCULAR | Status: DC | PRN
  Administered 2020-11-09 – 2020-11-15 (×3): 4 mg via INTRAVENOUS
  Filled 2020-11-06 (×3): qty 2

## 2020-11-06 MED ORDER — NYSTATIN 100000 UNIT/ML MT SUSP
5.0000 mL | Freq: Four times a day (QID) | OROMUCOSAL | Status: DC
  Administered 2020-11-07 (×2): 500000 [IU] via ORAL
  Filled 2020-11-06 (×4): qty 5

## 2020-11-06 MED ORDER — METRONIDAZOLE IN NACL 5-0.79 MG/ML-% IV SOLN
500.0000 mg | Freq: Three times a day (TID) | INTRAVENOUS | Status: DC
  Filled 2020-11-06 (×2): qty 100

## 2020-11-06 MED ORDER — OXYCODONE HCL 5 MG PO TABS
5.0000 mg | ORAL_TABLET | ORAL | Status: DC | PRN
  Administered 2020-11-06 – 2020-11-09 (×10): 5 mg via ORAL
  Filled 2020-11-06 (×10): qty 1

## 2020-11-06 MED ORDER — ACETAMINOPHEN 650 MG RE SUPP: 650.0000 mg | Freq: Four times a day (QID) | RECTAL | Status: DC | PRN

## 2020-11-06 MED ORDER — MORPHINE SULFATE (PF) 2 MG/ML IV SOLN
2.0000 mg | INTRAVENOUS | Status: DC | PRN
  Administered 2020-11-06 – 2020-11-10 (×7): 2 mg via INTRAVENOUS
  Filled 2020-11-06 (×8): qty 1

## 2020-11-06 MED ORDER — VANCOMYCIN HCL 1250 MG/250ML IV SOLN
1250.0000 mg | INTRAVENOUS | Status: AC
  Administered 2020-11-07 – 2020-11-11 (×5): 1250 mg via INTRAVENOUS
  Filled 2020-11-06 (×6): qty 250

## 2020-11-06 MED ORDER — BACLOFEN 10 MG PO TABS
10.0000 mg | ORAL_TABLET | Freq: Three times a day (TID) | ORAL | Status: DC | PRN
  Administered 2020-11-11 – 2020-11-14 (×3): 10 mg via ORAL
  Filled 2020-11-06 (×6): qty 1

## 2020-11-06 MED ORDER — ISOSORBIDE MONONITRATE ER 30 MG PO TB24
30.0000 mg | ORAL_TABLET | Freq: Every day | ORAL | Status: DC
  Administered 2020-11-07 – 2020-11-16 (×10): 30 mg via ORAL
  Filled 2020-11-06 (×10): qty 1

## 2020-11-06 MED ORDER — NITROGLYCERIN 0.4 MG SL SUBL: 0.4000 mg | SUBLINGUAL_TABLET | SUBLINGUAL | Status: DC | PRN

## 2020-11-06 MED ORDER — SODIUM CHLORIDE 0.9 % IV BOLUS
1000.0000 mL | Freq: Once | INTRAVENOUS | Status: AC
  Administered 2020-11-06: 1000 mL via INTRAVENOUS

## 2020-11-06 MED ORDER — ASPIRIN EC 81 MG PO TBEC
81.0000 mg | DELAYED_RELEASE_TABLET | Freq: Every day | ORAL | Status: DC
  Administered 2020-11-07 – 2020-11-16 (×9): 81 mg via ORAL
  Filled 2020-11-06 (×9): qty 1

## 2020-11-06 MED ORDER — ATORVASTATIN CALCIUM 10 MG PO TABS
80.0000 mg | ORAL_TABLET | Freq: Every day | ORAL | Status: DC
  Administered 2020-11-06 – 2020-11-15 (×10): 80 mg via ORAL
  Filled 2020-11-06 (×10): qty 4

## 2020-11-06 MED ORDER — ALBUTEROL SULFATE (2.5 MG/3ML) 0.083% IN NEBU: 2.5000 mg | INHALATION_SOLUTION | Freq: Four times a day (QID) | RESPIRATORY_TRACT | Status: DC | PRN

## 2020-11-06 MED ORDER — ONDANSETRON HCL 4 MG/2ML IJ SOLN
4.0000 mg | Freq: Once | INTRAMUSCULAR | Status: AC
  Administered 2020-11-06: 4 mg via INTRAVENOUS
  Filled 2020-11-06: qty 2

## 2020-11-06 MED ORDER — ONDANSETRON HCL 4 MG PO TABS
4.0000 mg | ORAL_TABLET | Freq: Four times a day (QID) | ORAL | Status: DC | PRN
  Administered 2020-11-09: 21:00:00 4 mg via ORAL
  Filled 2020-11-06: qty 1

## 2020-11-06 MED ORDER — FUROSEMIDE 40 MG PO TABS
20.0000 mg | ORAL_TABLET | ORAL | Status: DC
  Administered 2020-11-07: 20 mg via ORAL
  Filled 2020-11-06: qty 1

## 2020-11-06 MED ORDER — SODIUM CHLORIDE 0.9 % IV SOLN
2.0000 g | Freq: Three times a day (TID) | INTRAVENOUS | Status: DC
  Administered 2020-11-06 – 2020-11-09 (×9): 2 g via INTRAVENOUS
  Filled 2020-11-06 (×12): qty 2

## 2020-11-06 MED ORDER — POTASSIUM CHLORIDE CRYS ER 10 MEQ PO TBCR
10.0000 meq | EXTENDED_RELEASE_TABLET | Freq: Every day | ORAL | Status: DC
  Administered 2020-11-07 – 2020-11-16 (×9): 10 meq via ORAL
  Filled 2020-11-06 (×9): qty 1

## 2020-11-06 MED ORDER — SODIUM CHLORIDE 0.9 % IV BOLUS
1000.0000 mL | Freq: Once | INTRAVENOUS | Status: AC
Start: 2020-11-06 — End: 2020-11-07
  Administered 2020-11-06: 1000 mL via INTRAVENOUS

## 2020-11-06 MED ORDER — EZETIMIBE 10 MG PO TABS
10.0000 mg | ORAL_TABLET | Freq: Every day | ORAL | Status: DC
  Administered 2020-11-07 – 2020-11-16 (×9): 10 mg via ORAL
  Filled 2020-11-06 (×10): qty 1

## 2020-11-06 MED ORDER — VANCOMYCIN HCL 1500 MG/300ML IV SOLN
1500.0000 mg | Freq: Once | INTRAVENOUS | Status: AC
  Administered 2020-11-06: 21:00:00 1500 mg via INTRAVENOUS
  Filled 2020-11-06: qty 300

## 2020-11-06 MED ORDER — ACETAMINOPHEN 325 MG PO TABS
650.0000 mg | ORAL_TABLET | Freq: Four times a day (QID) | ORAL | Status: DC | PRN
  Administered 2020-11-10 – 2020-11-12 (×2): 650 mg via ORAL
  Filled 2020-11-06 (×3): qty 2

## 2020-11-06 NOTE — ED Provider Notes (Signed)
[facility] Emergency Department Provider Note  ____________________________________________  Time seen: Approximately 3:52 PM  I have reviewed the triage vital signs and the nursing notes.   HISTORY  Chief Complaint Foot Pain (Left foot scheduled to amputate , per pt black )    HPI Danny GUARDADO Sr. is a 70 y.o. male who presents the emergency department complaining of increasing left foot pain.  Patient has been followed by vascular surgery and podiatry and is to be scheduled for outpatient amputation of his left foot.  Patient did have revascularization procedure performed last week but has significant ischemic and necrotic/gangrenous activity to the left foot.  Despite the revascularization procedure, podiatry anticipated outpatient amputation.  Given the extent however they had advised the patient that he should proceed to the emergency department for any increased pain, erythema, edema of the foot or leg.  Patient states that he is having increasing erythema to the foot, he states that the pain is radiating up his leg and he has noticed some edema of the leg itself.  Patient denies any chest pain, shortness of breath, domino pain.  No fevers or chills.  Patient states that the Percocet initially prescribed for his pain was covering his pain but now the Percocet does not appear to be effective.  Patient sees Dr. Lucky Cowboy and  Dr. [name] for this condition.        Past Medical History:  Diagnosis Date  . CAD (coronary artery disease)    a. 01/2010 PCI of LAD; b. 09/2014 PCI/DES of mLAD due to ISR. D1 80, D2 80(jailed), LCX 33m; c. 07/2016 NSTEMI/Cath: LM nl, LAD 7m ISR, 40d, D1 90ost, 80p, D2 90ost, RI min irregs, LCX min irregs, OM1 90 small, OM2/3 min irregs, RCA min irregs, RPLB1 90, EF 25-35%.  . Chronic combined systolic and diastolic CHF (congestive heart failure) ([place])    a. 07/2016 Echo: EF 30%, severe septal/anterior HK, Gr1 DD.  [name] CKD (chronic kidney  disease), stage III ([place])   . COPD (chronic obstructive pulmonary disease) ([place])   . DM2 (diabetes mellitus, type 2) ([place])   . Erectile dysfunction   . HLD (hyperlipidemia)   . HTN (hypertension)   . Ischemic cardiomyopathy    a. 07/2016 Echo: EF 30% w/ sev septal/ant HK. Gr1 DD.    Patient Active Problem List   Diagnosis Date Noted  . Sepsis ([place]) 11/06/2020  . Atherosclerosis of native arteries of the extremities with gangrene ([place]) 10/28/2020  . Low back pain 06/29/2020  . Onychomycosis 03/29/2020  . Fall 03/29/2020  . AKI (acute kidney injury) ([place]) 12/28/2019  . Coronary artery disease of native artery of native heart with stable angina pectoris ([place]) 12/12/2018  . Pure hypercholesterolemia 12/12/2018  . Chest pain 10/31/2018  . Atherosclerosis of native arteries of extremity with intermittent claudication ([place]) 02/11/2018  . Decreased pedal pulses 07/02/2017  . Orthostasis 05/07/2017  . Anxiety and depression 05/07/2017  . CKD (chronic kidney disease) stage 3, GFR 30-59 ml/min (HCC) 03/06/2017  . Diabetic neuropathy ([place]) 03/06/2017  . Chronic combined systolic and diastolic CHF (congestive heart failure) ([place]) 03/05/2017  . Morbid obesity ([place]) 06/18/2016  . H/O medication noncompliance 06/18/2016  . COPD (chronic obstructive pulmonary disease) ([place]) 06/20/2015  . GERD (gastroesophageal reflux disease) 12/20/2014  . Diabetes mellitus type 2, uncontrolled, with complications ([place]) 88/41/6606  . Hyperlipidemia 02/10/2010  . Essential hypertension 02/10/2010  . CAD (coronary artery disease) 02/10/2010    Past Surgical History:  Procedure Laterality  Date  . CAD: stent to the LAD    . CARDIAC CATHETERIZATION  10/01/2014  . CARDIAC CATHETERIZATION N/A 07/23/2016   Procedure: Left Heart Cath and Coronary Angiography;  Surgeon: [name], MD;  Location: [facility];  Service: Cardiovascular;  Laterality: N/A;  . CORONARY ANGIOPLASTY WITH STENT PLACEMENT   10/01/2014  . LOWER EXTREMITY ANGIOGRAPHY Left 10/31/2020   Procedure: LOWER EXTREMITY ANGIOGRAPHY;  Surgeon: [name], MD;  Location: [facility];  Service: Cardiovascular;  Laterality: Left;    Prior to Admission medications   Medication Sig Start Date End Date Taking? Authorizing Provider  ACCU-CHEK GUIDE test strip CHECK CBGS 4 TIMES A DAY 11/16/19   [name], MD  albuterol (PROVENTIL) (2.5 MG/3ML) 0.083% nebulizer solution Take 3 mLs (2.5 mg total) by nebulization every 6 (six) hours as needed for wheezing or shortness of breath. 09/30/20   [name], MD  amoxicillin-clavulanate (AUGMENTIN) 875-125 MG tablet Take 1 tablet by mouth 2 (two) times daily. 10/21/20   [provider]  aspirin 81 MG tablet Take 81 mg by mouth daily.    [provider]  atorvastatin (LIPITOR) 80 MG tablet Take 1 tablet (80 mg total) by mouth daily. 09/17/19   [name], MD  baclofen (LIORESAL) 10 MG tablet TAKE 1 TABLET BY MOUTH THREE TIMES A DAY AS NEEDED FOR MUSCLE SPASMS 10/24/20   [name], MD  blood glucose meter kit and supplies KIT Dispense based on patient and insurance preference. Check CBGs 4 times daily. Dx code E11.9. 09/17/19   [name], MD  carvedilol (COREG) 12.5 MG tablet TAKE 1 TABLET (12.5 MG TOTAL) BY MOUTH 2 (TWO) TIMES DAILY WITH A MEAL. 02/29/20   [name], MD  clopidogrel (PLAVIX) 75 MG tablet TAKE 1 TABLET BY MOUTH EVERY DAY 02/09/20   Gollan, Kathlene November, MD  Dulaglutide (TRULICITY) 4.5 NL/8.9QJ SOPN Inject 4.5 mg as directed once a week. 07/11/20   [name], MD  empagliflozin (JARDIANCE) 25 MG TABS tablet Take 25 mg by mouth daily. 09/17/19   [name], MD  escitalopram (LEXAPRO) 10 MG tablet TAKE 1 TABLET BY MOUTH EVERY DAY 05/25/20   Einar Pheasant, MD  ezetimibe (ZETIA) 10 MG tablet TAKE 1 TABLET BY MOUTH EVERY DAY 02/29/20   [name], MD  furosemide (LASIX) 20 MG tablet TAKE 1 TABLET  (20 MG) BY MOUTH ONCE EVERY OTHER DAY 03/21/20   [name], MD  insulin degludec (TRESIBA FLEXTOUCH) 100 UNIT/ML FlexTouch Pen Inject 40 Units into the skin daily. 10/27/20   [name], MD  Insulin Pen Needle (PEN NEEDLES) 32G X 6 MM MISC 1 each by Does not apply route 2 (two) times daily. 11/17/19   [name], MD  isosorbide mononitrate (IMDUR) 30 MG 24 hr tablet TAKE 1 TABLET BY MOUTH EVERY DAY 02/09/20   [name], MD  lisinopril (ZESTRIL) 2.5 MG tablet Take 1 tablet (2.5 mg total) by mouth daily. 03/29/20   [name], MD  metFORMIN (GLUCOPHAGE-XR) 500 MG 24 hr tablet TAKE 1 TABLET BY MOUTH TWICE A DAY 08/08/20   [name], MD  nitroGLYCERIN (NITROSTAT) 0.4 MG SL tablet Place 1 tablet (0.4 mg total) under the tongue every 5 (five) minutes as needed for chest pain. Maximum of 3 doses. 10/26/20   Marrianne Mood D, PA-C  potassium chloride (MICRO-K) 10 MEQ CR capsule TAKE 1 CAPSULE (10 MEQ)  BY MOUTH ONCE EVERY OTHER DAY WITH LASIX (FUROSEMIDE) 02/26/20   Gollan, Kathlene November, MD  tamsulosin (FLOMAX) 0.4 MG CAPS capsule TAKE 1 CAPSULE BY MOUTH EVERY DAY 07/04/20   Einar Pheasant, MD  VENTOLIN HFA 108 (90 Base) MCG/ACT inhaler Inhale 2 puffs into the lungs every 6 (six) hours as needed for wheezing or shortness of breath. 09/17/19   [name], MD    Allergies Patient has no known allergies.  Family History  Problem Relation Age of Onset  . Heart attack Father        complications  . Cancer Brother     Social History Social History   Tobacco Use  . Smoking status: Former Smoker    Packs/day: 3.00    Years: 0.00    Pack years: 0.00    Types: Cigarettes    Quit date: 09/07/2009    Years since quitting: 11.1  . Smokeless tobacco: Never Used  . Tobacco comment: quit june 2011  Vaping Use  . Vaping Use: Never used  Substance Use Topics  . Alcohol use: Yes    Comment: every other weekend  . Drug use: No     Review of Systems   Constitutional: No fever/chills Eyes: No visual changes. No discharge ENT: No upper respiratory complaints. Cardiovascular: no chest pain. Respiratory: no cough. No SOB. Gastrointestinal: No abdominal pain.  No nausea, no vomiting.  No diarrhea.  No constipation. Musculoskeletal: Worsening left foot pain, erythema and edema extending up the left leg Skin: Negative for rash, abrasions, lacerations, ecchymosis. Neurological: Negative for headaches, focal weakness or numbness.  10 System ROS otherwise negative.  ____________________________________________   PHYSICAL EXAM:  VITAL SIGNS: ED Triage Vitals [11/06/20 1536]  Enc Vitals Group     BP      Pulse Rate (!) 124     Resp 18     Temp (!) 100.7 F (38.2 C)     Temp Source Oral     SpO2 94 %     Weight 220 lb 7.4 oz (100 kg)     Height $Remov'5\' 8"'WmmsKd$  (1.727 m)     Head Circumference      Peak Flow      Pain Score 8     Pain Loc      Pain Edu?      Excl. in [place]?      Constitutional: Alert and oriented. Well appearing and in no acute distress. Eyes: Conjunctivae are normal. PERRL. EOMI. Head: Atraumatic. ENT:      Ears:       Nose: No congestion/rhinnorhea.      Mouth/Throat: Mucous membranes are moist.  Neck: No stridor.    Cardiovascular: Normal rate, regular rhythm. Normal S1 and S2.  Good peripheral circulation. Respiratory: Normal respiratory effort without tachypnea or retractions. Lungs CTAB. Good air entry to the bases with no decreased or absent breath sounds. Gastrointestinal: Bowel sounds 4 quadrants. Soft and nontender to palpation. No guarding or rigidity. No palpable masses. No distention. No CVA tenderness. Musculoskeletal: Full range of motion to all extremities. No gross deformities appreciated.  Visualization of the left foot reveals significant gangrenous changes to the first and second digits.  Patient does have ecchymotic/dusky changes to the sole of the foot as well.  This extends into the level of the  midfoot.  Patient does not have an appreciable dorsalis pedis pulse but does have a good posterior tibialis pulse.  Foot is erythematous extending into the leg.  The foot  and leg is warm/hot to the touch when compared with the right foot.  No appreciable fluctuance or induration.  See below pictures Neurologic:  Normal speech and language. No gross focal neurologic deficits are appreciated.  Skin:  Skin is warm, dry and intact. No rash noted. Psychiatric: Mood and affect are normal. Speech and behavior are normal. Patient exhibits appropriate insight and judgement.         ____________________________________________   LABS (all labs ordered are listed, but only abnormal results are displayed)  Labs Reviewed  COMPREHENSIVE METABOLIC PANEL - Abnormal; Notable for the following components:      Result Value   CO2 21 (*)    Glucose, Bld 212 (*)    Creatinine, Ser 1.27 (*)    Calcium 8.6 (*)    Albumin 3.0 (*)    AST 74 (*)    ALT 99 (*)    Alkaline Phosphatase 129 (*)    All other components within normal limits  CBC WITH DIFFERENTIAL/PLATELET - Abnormal; Notable for the following components:   WBC 24.4 (*)    Platelets 407 (*)    Neutro Abs 20.6 (*)    Monocytes Absolute 2.1 (*)    Abs Immature Granulocytes 0.26 (*)    All other components within normal limits  CULTURE, BLOOD (ROUTINE X 2)  CULTURE, BLOOD (ROUTINE X 2)  SARS CORONAVIRUS 2 (TAT 6-24 HRS)  LACTIC ACID, PLASMA  LACTIC ACID, PLASMA  HIV ANTIBODY (ROUTINE TESTING W REFLEX)   ____________________________________________  EKG   ____________________________________________  RADIOLOGY I personally viewed and evaluated these images as part of my medical decision making, as well as reviewing the written report by the radiologist.  ED Provider Interpretation: No evidence of DVT on ultrasound.  No active cardiopulmonary disease on chest x-ray.  US Venous Img Lower Unilateral Left  Result Date:  11/06/2020 CLINICAL DATA:  Necrotic gangrenous toe.  Increased pain.  Erythema. EXAM: LEFT LOWER EXTREMITY VENOUS DOPPLER ULTRASOUND TECHNIQUE: Gray-scale sonography with compression, as well as color and duplex ultrasound, were performed to evaluate the deep venous system(s) from the level of the common femoral vein through the popliteal and proximal calf veins. COMPARISON:  None. FINDINGS: VENOUS Normal compressibility of the common femoral, superficial femoral, and popliteal veins, as well as the visualized calf veins. Visualized portions of profunda femoral vein and great saphenous vein unremarkable. No filling defects to suggest DVT on grayscale or color Doppler imaging. Doppler waveforms show normal direction of venous flow, normal respiratory plasticity and response to augmentation. Limited views of the contralateral common femoral vein are unremarkable. OTHER None. Limitations: none IMPRESSION: Negative. Electronically Signed   By: Dorise Bullion III M.D   On: 11/06/2020 17:45   DG Chest Portable 1 View  Result Date: 11/06/2020 CLINICAL DATA:  Mild hypoxia.  Left foot necrosis. EXAM: PORTABLE CHEST 1 VIEW COMPARISON:  July 29, 2019 FINDINGS: Linear opacity in the right mid lung is unchanged, consistent with scarring. Minimal atelectasis in the left base. Emphysematous changes in the lungs. No nodules or masses. Cardiomediastinal silhouette is stable. No pneumothorax. IMPRESSION: No active disease. Electronically Signed   By: Dorise Bullion III M.D   On: 11/06/2020 17:44    ____________________________________________    PROCEDURES  Procedure(s) performed:    .Critical Care E&M Performed by: Darletta Moll, PA-C  Critical care provider statement:    Critical care time (minutes):  48   Critical care time was exclusive of:  Separately billable procedures and treating other patients  and teaching time   Critical care was necessary to treat or prevent imminent or life-threatening  deterioration of the following conditions:  Sepsis   Critical care was time spent personally by me on the following activities:  Obtaining history from patient or surrogate, examination of patient, evaluation of patient's response to treatment, development of treatment plan with patient or surrogate, ordering and performing treatments and interventions, ordering and review of laboratory studies, ordering and review of radiographic studies, pulse oximetry, re-evaluation of patient's condition and review of old charts   Care discussed with: admitting provider   After initial E/M assessment, critical care services were subsequently performed that were exclusive of separately billable procedures or treatment.      ED Sepsis - Repeat Assessment   Performed at:    11/25/2020 1715 hrs  Last Vitals:    Blood pressure (!) 146/84, pulse (!) 124, temperature (!) 100.7 F (38.2 C), temperature source Oral, resp. rate 18, height 5\' 8"  (1.727 m), weight 100 kg, SpO2 94 %.  Heart:       Tachycardic with a rate in the 120s  Lungs:     No adventitious lung sounds  Capillary Refill:   Less than 2 seconds  Peripheral Pulse (include location): Unable to palpate dorsalis pedis pulse in the left foot, posterior tibialis intact.  Remainder of peripheral pulses intact   Skin (include color):   Patient with necrosis, gangrenous left foot.  Remainder of extremities are reassuring.   Medications  vancomycin (VANCOCIN) IVPB 1000 mg/200 mL premix (1,000 mg Intravenous New Bag/Given 11/06/20 1727)  lactated ringers bolus 250 mL (has no administration in time range)  lactated ringers infusion (has no administration in time range)  aspirin tablet 81 mg (has no administration in time range)  atorvastatin (LIPITOR) tablet 80 mg (has no administration in time range)  carvedilol (COREG) tablet 12.5 mg (has no administration in time range)  ezetimibe (ZETIA) tablet 10 mg (has no administration in time range)  furosemide  (LASIX) tablet 20 mg (has no administration in time range)  isosorbide mononitrate (IMDUR) 24 hr tablet 30 mg (has no administration in time range)  lisinopril (ZESTRIL) tablet 2.5 mg (has no administration in time range)  nitroGLYCERIN (NITROSTAT) SL tablet 0.4 mg (has no administration in time range)  escitalopram (LEXAPRO) tablet 10 mg (has no administration in time range)  insulin degludec (TRESIBA) 100 UNIT/ML FlexTouch Pen 20 Units (has no administration in time range)  tamsulosin (FLOMAX) capsule 0.4 mg (has no administration in time range)  baclofen (LIORESAL) tablet 10 mg (has no administration in time range)  potassium chloride SA (KLOR-CON) CR tablet 10 mEq (has no administration in time range)  albuterol (PROVENTIL) (2.5 MG/3ML) 0.083% nebulizer solution 2.5 mg (has no administration in time range)  acetaminophen (TYLENOL) tablet 650 mg (has no administration in time range)    Or  acetaminophen (TYLENOL) suppository 650 mg (has no administration in time range)  oxyCODONE (Oxy IR/ROXICODONE) immediate release tablet 5 mg (has no administration in time range)  morphine 2 MG/ML injection 2 mg (has no administration in time range)  ondansetron (ZOFRAN) tablet 4 mg (has no administration in time range)    Or  ondansetron (ZOFRAN) injection 4 mg (has no administration in time range)  sodium chloride 0.9 % bolus 1,000 mL (0 mLs Intravenous Stopped 11/06/20 1659)  ondansetron (ZOFRAN) injection 4 mg (4 mg Intravenous Given 11/06/20 1613)  sodium chloride 0.9 % bolus 1,000 mL (1,000 mLs Intravenous New Bag/Given 11/06/20 1648)  ceFEPIme (MAXIPIME) 1 g in sodium chloride 0.9 % 100 mL IVPB (0 g Intravenous Stopped 11/06/20 1727)     ____________________________________________   INITIAL IMPRESSION / ASSESSMENT AND PLAN / ED COURSE  Pertinent labs & imaging results that were available during my care of the patient were reviewed by me and considered in my medical decision making (see chart for  details).  Review of the [place] CSRS was performed in accordance of the [place] prior to dispensing any controlled drugs.           Patient's diagnosis is consistent with sepsis, gangrenous foot.  Patient presented to the emergency department for increasing pain, edema and erythema of the left leg.  Patient has been followed by vascular surgery and podiatry with Dr. Lucky Cowboy and Dr. [name].  He had a revascularization procedure earlier this week but given the state of the patient's left foot they felt like this would still require amputation.  Patient been scheduled outpatient however given strict precautions to come to the emergency department for any worsening symptoms.  Patient arrived with increased erythema and edema of the foot with extension of the leg.  This was very warm to palpation and differential includes DVT versus cellulitis versus osteomyelitis.  Patient with findings of fever, tachycardia and elevation in LFTs concerning for severe sepsis.  Patient is started empirically on vancomycin and cefepime and given fluids.  At this time patient will be admitted to the hospitalist service..     ____________________________________________  FINAL CLINICAL IMPRESSION(S) / ED DIAGNOSES  Final diagnoses:  Severe sepsis ([place])  Gangrene of left foot ([place])      NEW MEDICATIONS STARTED DURING THIS VISIT:  ED Discharge Orders    None          This chart was dictated using voice recognition software/Dragon. Despite best efforts to proofread, errors can occur which can change the meaning. Any change was purely unintentional.    Darletta Moll, PA-C 11/06/20 1801    Delman Kitten, MD 11/07/20 1136

## 2020-11-06 NOTE — ED Provider Notes (Signed)
Medical screening examination/treatment/procedure(s) were conducted as a shared visit with non-physician practitioner(s) and myself.  I personally evaluated the patient during the encounter.  ----------------------------------------- 5:01 PM on 11/06/2020 -----------------------------------------  Patient has obvious evidence of sepsis tachycardia hyperthermia, and concern for possible endorgan damage already occurring with a mild oxygen requirement of 2 L, as well as transaminitis and elevated creatinine.  Code sepsis initiated.        Sharyn Creamer, MD 11/06/20 [id number]

## 2020-11-06 NOTE — H&P (Signed)
[facility] [place] NAME: Danny [name]    MR#:  [id number]  DATE OF BIRTH:  08-28-1950  DATE OF ADMISSION:  11/06/2020  PRIMARY CARE PHYSICIAN: [name], MD   REQUESTING/REFERRING PHYSICIAN: Dr. Delman Kitten  CHIEF COMPLAINT:   Chief Complaint  Patient presents with  . Foot Pain    Left foot scheduled to amputate , per pt black     HISTORY OF PRESENT ILLNESS:  Danny [name]  is a 70 y.o. male with a known history of CAD, CHF, COPD, type 2 diabetes with hyperlipidemia came to the ER with worsening pain in his foot.  He has been followed as outpatient by Dr. [name] podiatry seen first on 10/21/2020.  He had gangrene of the toe at that time.  He was referred to vascular surgery for an angiogram.  On 10/31/2000 Dr. Lucky Cowboy did an angiogram and had 3 angioplasties and 1 stent placed.  With patient having worsening foot pain he came back to the hospital.  Pain 7 out of 10 in intensity.  He has been having trouble thinking straight.  His white blood cell count was elevated and he was tachycardic and still has gangrene.  Hospitalist services were contacted for admission for sepsis.  PAST MEDICAL HISTORY:   Past Medical History:  Diagnosis Date  . CAD (coronary artery disease)    a. 01/2010 PCI of LAD; b. 09/2014 PCI/DES of mLAD due to ISR. D1 80, D2 80(jailed), LCX 22m; c. 07/2016 NSTEMI/Cath: LM nl, LAD 19m ISR, 40d, D1 90ost, 80p, D2 90ost, RI min irregs, LCX min irregs, OM1 90 small, OM2/3 min irregs, RCA min irregs, RPLB1 90, EF 25-35%.  . Chronic combined systolic and diastolic CHF (congestive heart failure) ([place])    a. 07/2016 Echo: EF 30%, severe septal/anterior HK, Gr1 DD.  [name] CKD (chronic kidney disease), stage III ([place])   . COPD (chronic obstructive pulmonary disease) ([place])   . DM2 (diabetes mellitus, type 2) ([place])   . Erectile dysfunction   . HLD (hyperlipidemia)   . HTN (hypertension)   . Ischemic cardiomyopathy    a. 07/2016 Echo: EF  30% w/ sev septal/ant HK. Gr1 DD.    PAST SURGICAL HISTORY:   Past Surgical History:  Procedure Laterality Date  . CAD: stent to the LAD    . CARDIAC CATHETERIZATION  10/01/2014  . CARDIAC CATHETERIZATION N/A 07/23/2016   Procedure: Left Heart Cath and Coronary Angiography;  Surgeon: [name], MD;  Location: [facility];  Service: Cardiovascular;  Laterality: N/A;  . CORONARY ANGIOPLASTY WITH STENT PLACEMENT  10/01/2014  . LOWER EXTREMITY ANGIOGRAPHY Left 10/31/2020   Procedure: LOWER EXTREMITY ANGIOGRAPHY;  Surgeon: [name], MD;  Location: [facility];  Service: Cardiovascular;  Laterality: Left;    SOCIAL HISTORY:   Social History   Tobacco Use  . Smoking status: Former Smoker    Packs/day: 3.00    Years: 0.00    Pack years: 0.00    Types: Cigarettes    Quit date: 09/07/2009    Years since quitting: 11.1  . Smokeless tobacco: Never Used  . Tobacco comment: quit june 2011  Substance Use Topics  . Alcohol use: Yes    Comment: every other weekend    FAMILY HISTORY:   Family History  Problem Relation Age of Onset  . Heart attack Father        complications  . Cancer Brother     DRUG  ALLERGIES:  No Known Allergies  REVIEW OF SYSTEMS:  CONSTITUTIONAL: No fever, chills or sweats.  Positive for fatigue.  EYES: Wears glasses and some difficulty with vision without his glasses. EARS, NOSE, AND THROAT: No tinnitus or ear pain. No sore throat.  Positive for dry mouth. RESPIRATORY: No cough, occasional shortness of breath, no wheezing or hemoptysis.  CARDIOVASCULAR: No chest pain.  GASTROINTESTINAL: After angiogram had nausea and vomiting.  No diarrhea or abdominal pain. No blood in bowel movements GENITOURINARY: No dysuria, hematuria.  ENDOCRINE: No polyuria, nocturia,  HEMATOLOGY: No anemia, easy bruising or bleeding SKIN: Positive for gangrene of the left first toe MUSCULOSKELETAL: Positive for left foot pain NEUROLOGIC: No tingling,  numbness, weakness.  PSYCHIATRY: History of depression.   MEDICATIONS AT HOME:   Prior to Admission medications   Medication Sig Start Date End Date Taking? Authorizing Provider  ACCU-CHEK GUIDE test strip CHECK CBGS 4 TIMES A DAY 11/16/19   [name], MD  albuterol (PROVENTIL) (2.5 MG/3ML) 0.083% nebulizer solution Take 3 mLs (2.5 mg total) by nebulization every 6 (six) hours as needed for wheezing or shortness of breath. 09/30/20   [name], MD  amoxicillin-clavulanate (AUGMENTIN) 875-125 MG tablet Take 1 tablet by mouth 2 (two) times daily. 10/21/20   [provider]  aspirin 81 MG tablet Take 81 mg by mouth daily.    [provider]  atorvastatin (LIPITOR) 80 MG tablet Take 1 tablet (80 mg total) by mouth daily. 09/17/19   [name], MD  baclofen (LIORESAL) 10 MG tablet TAKE 1 TABLET BY MOUTH THREE TIMES A DAY AS NEEDED FOR MUSCLE SPASMS 10/24/20   [name], MD  blood glucose meter kit and supplies KIT Dispense based on patient and insurance preference. Check CBGs 4 times daily. Dx code E11.9. 09/17/19   [name], MD  carvedilol (COREG) 12.5 MG tablet TAKE 1 TABLET (12.5 MG TOTAL) BY MOUTH 2 (TWO) TIMES DAILY WITH A MEAL. 02/29/20   [name], MD  clopidogrel (PLAVIX) 75 MG tablet TAKE 1 TABLET BY MOUTH EVERY DAY 02/09/20   Gollan, Kathlene November, MD  Dulaglutide (TRULICITY) 4.5 IR/6.7EL SOPN Inject 4.5 mg as directed once a week. 07/11/20   [name], MD  empagliflozin (JARDIANCE) 25 MG TABS tablet Take 25 mg by mouth daily. 09/17/19   [name], MD  escitalopram (LEXAPRO) 10 MG tablet TAKE 1 TABLET BY MOUTH EVERY DAY 05/25/20   Einar Pheasant, MD  ezetimibe (ZETIA) 10 MG tablet TAKE 1 TABLET BY MOUTH EVERY DAY 02/29/20   [name], MD  furosemide (LASIX) 20 MG tablet TAKE 1 TABLET (20 MG) BY MOUTH ONCE EVERY OTHER DAY 03/21/20   [name], MD  insulin degludec (TRESIBA FLEXTOUCH) 100 UNIT/ML  FlexTouch Pen Inject 40 Units into the skin daily. 10/27/20   [name], MD  Insulin Pen Needle (PEN NEEDLES) 32G X 6 MM MISC 1 each by Does not apply route 2 (two) times daily. 11/17/19   [name], MD  isosorbide mononitrate (IMDUR) 30 MG 24 hr tablet TAKE 1 TABLET BY MOUTH EVERY DAY 02/09/20   [name], MD  lisinopril (ZESTRIL) 2.5 MG tablet Take 1 tablet (2.5 mg total) by mouth daily. 03/29/20   [name], MD  metFORMIN (GLUCOPHAGE-XR) 500 MG 24 hr tablet TAKE 1 TABLET BY MOUTH TWICE A DAY 08/08/20   [name], MD  nitroGLYCERIN (NITROSTAT) 0.4 MG SL tablet Place  1 tablet (0.4 mg total) under the tongue every 5 (five) minutes as needed for chest pain. Maximum of 3 doses. 10/26/20   Marrianne Mood D, PA-C  potassium chloride (MICRO-K) 10 MEQ CR capsule TAKE 1 CAPSULE (10 MEQ) BY MOUTH ONCE EVERY OTHER DAY WITH LASIX (FUROSEMIDE) 02/26/20   Gollan, Kathlene November, MD  tamsulosin (FLOMAX) 0.4 MG CAPS capsule TAKE 1 CAPSULE BY MOUTH EVERY DAY 07/04/20   Einar Pheasant, MD  VENTOLIN HFA 108 (90 Base) MCG/ACT inhaler Inhale 2 puffs into the lungs every 6 (six) hours as needed for wheezing or shortness of breath. 09/17/19   [name], MD      VITAL SIGNS:  Blood pressure (!) 146/84, pulse (!) 124, temperature (!) 100.7 F (38.2 C), temperature source Oral, resp. rate 18, height $RemoveBe'5\' 8"'bXuRfQYXJ$  (1.727 m), weight 100 kg, SpO2 94 %.  PHYSICAL EXAMINATION:  GENERAL:  70 y.o.-year-old patient lying in the bed with no acute distress.  EYES: Pupils equal, round, reactive to light and accommodation. No scleral icterus. Extraocular muscles intact.  HEENT: Head atraumatic, normocephalic. Oropharynx and nasopharynx clear.  Thrush on tongue. NECK:  Supple, no jugular venous distention. No thyroid enlargement, no tenderness.  LUNGS: Normal breath sounds bilaterally, no wheezing, rales,rhonchi or crepitation. No use of accessory muscles of respiration.  CARDIOVASCULAR: S1, S2  normal. No murmurs, rubs, or gallops.  ABDOMEN: Soft, nontender, nondistended. Bowel sounds present. No organomegaly or mass.  EXTREMITIES: No pedal edema, cyanosis, or clubbing.  NEUROLOGIC: Cranial nerves II through XII are intact. Muscle strength 5/5 in all extremities. Sensation intact. Gait not checked.  PSYCHIATRIC: The patient is alert and oriented x 3.  SKIN: See picture below        LABORATORY PANEL:   CBC Recent Labs  Lab 11/06/20 1541  WBC 24.4*  HGB 13.5  HCT 42.3  PLT 407*   ------------------------------------------------------------------------------------------------------------------  Chemistries  Recent Labs  Lab 11/06/20 1541  NA 135  K 3.9  CL 102  CO2 21*  GLUCOSE 212*  BUN 23  CREATININE 1.27*  CALCIUM 8.6*  AST 74*  ALT 99*  ALKPHOS 129*  BILITOT 0.8   ------------------------------------------------------------------------------------------------------------------   RADIOLOGY:  US Venous Img Lower Unilateral Left  Result Date: 11/06/2020 CLINICAL DATA:  Necrotic gangrenous toe.  Increased pain.  Erythema. EXAM: LEFT LOWER EXTREMITY VENOUS DOPPLER ULTRASOUND TECHNIQUE: Gray-scale sonography with compression, as well as color and duplex ultrasound, were performed to evaluate the deep venous system(s) from the level of the common femoral vein through the popliteal and proximal calf veins. COMPARISON:  None. FINDINGS: VENOUS Normal compressibility of the common femoral, superficial femoral, and popliteal veins, as well as the visualized calf veins. Visualized portions of profunda femoral vein and great saphenous vein unremarkable. No filling defects to suggest DVT on grayscale or color Doppler imaging. Doppler waveforms show normal direction of venous flow, normal respiratory plasticity and response to augmentation. Limited views of the contralateral common femoral vein are unremarkable. OTHER None. Limitations: none IMPRESSION: Negative.  Electronically Signed   By: Dorise Bullion III M.D   On: 11/06/2020 17:45   DG Chest Portable 1 View  Result Date: 11/06/2020 CLINICAL DATA:  Mild hypoxia.  Left foot necrosis. EXAM: PORTABLE CHEST 1 VIEW COMPARISON:  July 29, 2019 FINDINGS: Linear opacity in the right mid lung is unchanged, consistent with scarring. Minimal atelectasis in the left base. Emphysematous changes in the lungs. No nodules or masses. Cardiomediastinal silhouette is stable. No pneumothorax. IMPRESSION: No active disease. Electronically  Signed   By: Dorise Bullion III M.D   On: 11/06/2020 17:44    EKG:   EKG interpreted by me sinus tachycardia 124 bpm flattening of T waves laterally.  IMPRESSION AND PLAN:   1.  Clinical sepsis present on admission with gangrene of the left first toe and starting of the second toe and dusky foot.  Patient has leukocytosis and tachycardia.  Peripheral vascular disease.  Obtain blood cultures.  Empiric antibiotics with vancomycin and cefepime.  Consulted podiatry Dr. Vickki Muff for likely amputation and vascular surgery Dr. Trula Slade to review recent angiogram.  Continue aspirin for now.  Hold Plavix for likely amputation. 2.  Type 2 diabetes mellitus with hyperlipidemia.  Will give half dose insulin tomorrow morning and sliding scale hold oral medications.  Continue atorvastatin. 3.  History of CAD and chronic diastolic CHF.  We will give IV fluid hydration for now with sepsis but careful with fluids.  Last echocardiogram in epic system EF 55 to 60%.  Continue cardiac meds with Coreg, lisinopril and Imdur. 4.  COPD.  Respiratory status stable.  Continue inhalers 5.  Essential hypertension continue lisinopril and Coreg and Imdur 6.  Chronic kidney disease stage II   All the records, prior records, laboratory data are reviewed and case discussed with ED provider. Management plans discussed with the patient, family and they are in agreement.  Patient meets inpatient criteria secondary to  sepsis and gangrene of the left first toe and dusky foot.   CODE STATUS: Full code  TOTAL TIME TAKING CARE OF THIS PATIENT: 55 minutes.    Loletha Grayer M.D on 11/06/2020 at 6:01 PM  Between 7am to 6pm - Pager - [phone]  After 6pm call admission pager [phone]  [facility]  CC: Primary care physician; [name], MD

## 2020-11-06 NOTE — Consult Note (Signed)
Pharmacy Antibiotic Note  Danny [name] Sr. is a 70 y.o. male admitted on 11/06/2020 with increasing left foot pain, erythema, and leg edema. Patient with PMH significant for insulin-dependent DM, PAD, and gangrenous activity to the left foot. S/p revascularization 10/31/20. Patient follows with podiatry outpatient and is to be scheduled for outpatient amputation of left foot.  Pharmacy has been consulted for vancomycin and cefepime dosing.  Plan:  Recommend addition of metronidazole 500 mg Q8H  Cefepime 2 gram Q8H  Vancomycin 1000 mg x 1 given in ED. Order additional 1500 mg for total LD of 2500 mg IV Vancomycin  Vancomycin maintenance regimen of 1250 mg Q24H. Goal AUC: 400-550  Expected AUC: 515.   SCR used: 1.27  Follow up imaging cultures/source control plans  Vancomycin levels at steady state if warranted  Monitor renal function daily   Height: 5\' 8"  (172.7 cm) Weight: 100 kg (220 lb 7.4 oz) IBW/kg (Calculated) : 68.4  Temp (24hrs), Avg:100.7 F (38.2 C), Min:100.7 F (38.2 C), Max:100.7 F (38.2 C)  Recent Labs  Lab 11/06/20 1541  WBC 24.4*  CREATININE 1.27*  LATICACIDVEN 1.7    Estimated Creatinine Clearance: 62.9 mL/min (A) (by C-G formula based on SCr of 1.27 mg/dL (H)).    No Known Allergies  Antimicrobials this admission: 4/3 cefepime >>  4/3 vancomycin >>  4/3 metronidazole >>  Dose adjustments this admission: n/a  Microbiology results: 4/3 BCx: sent   Thank you for allowing pharmacy to be a part of this patient's care.  6/3, PharmD, BCPS Clinical Pharmacist  11/06/2020 6:12 PM

## 2020-11-06 NOTE — Consult Note (Signed)
[facility] [place]  Patient name: Danny LEICHTER Sr. MRN: [id number] DOB: November 27, 1950 Sex: male   REQUESTING PROVIDER:    Dr. Leslye Peer   REASON FOR CONSULT:    Left toe gangrene  HISTORY OF PRESENT ILLNESS:   Danny PLEMONS Sr. is a 70 y.o. male, who on 11-01-2020 underwent PTA of the left PT and peroneal with SFA stenting by Dr. Lucky Cowboy.  He presented to the emergency department with worsening pain of his left foot.  The patient has a history of coronary artery disease, status post PCI.  He has chronic combined systolic and diastolic heart failure with an ejection fraction of 25 to 35%.  He has CKD 3.  He is a diabetic.  He is medically managed for hypertension.  He is on a statin for hypercholesterolemia.  He is a former smoker.  PAST MEDICAL HISTORY    Past Medical History:  Diagnosis Date  . CAD (coronary artery disease)    a. 01/2010 PCI of LAD; b. 09/2014 PCI/DES of mLAD due to ISR. D1 80, D2 80(jailed), LCX 23m; c. 07/2016 NSTEMI/Cath: LM nl, LAD 33m ISR, 40d, D1 90ost, 80p, D2 90ost, RI min irregs, LCX min irregs, OM1 90 small, OM2/3 min irregs, RCA min irregs, RPLB1 90, EF 25-35%.  . Chronic combined systolic and diastolic CHF (congestive heart failure) ([place])    a. 07/2016 Echo: EF 30%, severe septal/anterior HK, Gr1 DD.  [name] CKD (chronic kidney disease), stage III ([place])   . COPD (chronic obstructive pulmonary disease) ([place])   . DM2 (diabetes mellitus, type 2) ([place])   . Erectile dysfunction   . HLD (hyperlipidemia)   . HTN (hypertension)   . Ischemic cardiomyopathy    a. 07/2016 Echo: EF 30% w/ sev septal/ant HK. Gr1 DD.     FAMILY HISTORY   Family History  Problem Relation Age of Onset  . Heart attack Father        complications  . Cancer Brother     SOCIAL HISTORY:   Social History   Socioeconomic History  . Marital status: Single    Spouse name: Not on file  . Number of children: Not on file  . Years  of education: Not on file  . Highest education level: Not on file  Occupational History  . Not on file  Tobacco Use  . Smoking status: Former Smoker    Packs/day: 3.00    Years: 0.00    Pack years: 0.00    Types: Cigarettes    Quit date: 09/07/2009    Years since quitting: 11.1  . Smokeless tobacco: Never Used  . Tobacco comment: quit june 2011  Vaping Use  . Vaping Use: Never used  Substance and Sexual Activity  . Alcohol use: Yes    Comment: every other weekend  . Drug use: No  . Sexual activity: Not on file  Other Topics Concern  . Not on file  Social History Narrative  . Not on file   Social Determinants of Health   Financial Resource Strain: Low Risk   . Difficulty of Paying Living Expenses: Not hard at all  Food Insecurity: No Food Insecurity  . Worried About Charity fundraiser in the Last Year: Never true  . Ran Out of Food in the Last Year: Never true  Transportation Needs: No Transportation Needs  . Lack of Transportation (Medical): No  . Lack of Transportation (Non-Medical): No  Physical Activity: Not on file  Stress: Stress  Concern Present  . Feeling of Stress : Rather much  Social Connections: Not on file  Intimate Partner Violence: Not on file    ALLERGIES:    No Known Allergies  CURRENT MEDICATIONS:    Current Facility-Administered Medications  Medication Dose Route Frequency Provider Last Rate Last Admin  . acetaminophen (TYLENOL) tablet 650 mg  650 mg Oral Q6H PRN Loletha Grayer, MD       Or  . acetaminophen (TYLENOL) suppository 650 mg  650 mg Rectal Q6H PRN Wieting, Richard, MD      . albuterol (PROVENTIL) (2.5 MG/3ML) 0.083% nebulizer solution 2.5 mg  2.5 mg Nebulization Q6H PRN Loletha Grayer, MD      . Derrill Memo ON 11/07/2020] aspirin tablet 81 mg  81 mg Oral Daily Wieting, Richard, MD      . atorvastatin (LIPITOR) tablet 80 mg  80 mg Oral q1800 Wieting, Richard, MD      . baclofen (LIORESAL) tablet 10 mg  10 mg Oral TID PRN Loletha Grayer, MD      . carvedilol (COREG) tablet 12.5 mg  12.5 mg Oral BID WC Wieting, Richard, MD      . ceFEPIme (MAXIPIME) 2 g in sodium chloride 0.9 % 100 mL IVPB  2 g Intravenous Q8H Dorothe Pea, RPH      . [START ON 11/07/2020] escitalopram (LEXAPRO) tablet 10 mg  10 mg Oral Daily Wieting, Richard, MD      . ezetimibe (ZETIA) tablet 10 mg  10 mg Oral Daily Loletha Grayer, MD      . Derrill Memo ON 11/07/2020] furosemide (LASIX) tablet 20 mg  20 mg Oral Gertie Gowda, Richard, MD      . Derrill Memo ON 11/07/2020] insulin degludec (TRESIBA) 100 UNIT/ML FlexTouch Pen 20 Units  20 Units Subcutaneous Daily Loletha Grayer, MD      . Derrill Memo ON 11/07/2020] isosorbide mononitrate (IMDUR) 24 hr tablet 30 mg  30 mg Oral Daily Wieting, Richard, MD      . lactated ringers infusion   Intravenous Continuous Loletha Grayer, MD      . Derrill Memo ON 11/07/2020] lisinopril (ZESTRIL) tablet 2.5 mg  2.5 mg Oral Daily Wieting, Richard, MD      . morphine 2 MG/ML injection 2 mg  2 mg Intravenous Q3H PRN Wieting, Richard, MD      . nitroGLYCERIN (NITROSTAT) SL tablet 0.4 mg  0.4 mg Sublingual Q5 min PRN Wieting, Richard, MD      . nystatin (MYCOSTATIN) 100000 UNIT/ML suspension 500,000 Units  5 mL Oral QID Wieting, Richard, MD      . ondansetron [facility]) tablet 4 mg  4 mg Oral Q6H PRN Loletha Grayer, MD       Or  . ondansetron (ZOFRAN) injection 4 mg  4 mg Intravenous Q6H PRN Wieting, Richard, MD      . oxyCODONE (Oxy IR/ROXICODONE) immediate release tablet 5 mg  5 mg Oral Q4H PRN Loletha Grayer, MD      . Derrill Memo ON 11/07/2020] potassium chloride SA (KLOR-CON) CR tablet 10 mEq  10 mEq Oral Daily Loletha Grayer, MD      . Derrill Memo ON 11/07/2020] tamsulosin (FLOMAX) capsule 0.4 mg  0.4 mg Oral Daily Wieting, Richard, MD      . vancomycin (VANCOCIN) IVPB 1000 mg/200 mL premix  1,000 mg Intravenous Once Cuthriell, Charline Bills, PA-C 200 mL/hr at 11/06/20 1727 1,000 mg at 11/06/20 1727  . [START ON 11/07/2020] vancomycin (VANCOREADY) IVPB  1250 mg/250 mL  1,250 mg Intravenous  Q24H Dorothe Pea, RPH      . vancomycin (VANCOREADY) IVPB 1500 mg/300 mL  1,500 mg Intravenous Once Dorothe Pea, [facility]       Current Outpatient Medications  Medication Sig Dispense Refill  . ACCU-CHEK GUIDE test strip CHECK CBGS 4 TIMES A DAY 300 strip 1  . albuterol (PROVENTIL) (2.5 MG/3ML) 0.083% nebulizer solution Take 3 mLs (2.5 mg total) by nebulization every 6 (six) hours as needed for wheezing or shortness of breath. 150 mL 1  . amoxicillin-clavulanate (AUGMENTIN) 875-125 MG tablet Take 1 tablet by mouth 2 (two) times daily.    [name] aspirin 81 MG tablet Take 81 mg by mouth daily.    [name] atorvastatin (LIPITOR) 80 MG tablet Take 1 tablet (80 mg total) by mouth daily. 90 tablet 3  . baclofen (LIORESAL) 10 MG tablet TAKE 1 TABLET BY MOUTH THREE TIMES A DAY AS NEEDED FOR MUSCLE SPASMS 30 tablet 0  . blood glucose meter kit and supplies KIT Dispense based on patient and insurance preference. Check CBGs 4 times daily. Dx code E11.9. 1 each 0  . carvedilol (COREG) 12.5 MG tablet TAKE 1 TABLET (12.5 MG TOTAL) BY MOUTH 2 (TWO) TIMES DAILY WITH A MEAL. 180 tablet 3  . clopidogrel (PLAVIX) 75 MG tablet TAKE 1 TABLET BY MOUTH EVERY DAY 90 tablet 3  . Dulaglutide (TRULICITY) 4.5 GT/3.6IW SOPN Inject 4.5 mg as directed once a week. 6 mL 3  . empagliflozin (JARDIANCE) 25 MG TABS tablet Take 25 mg by mouth daily. 90 tablet 3  . escitalopram (LEXAPRO) 10 MG tablet TAKE 1 TABLET BY MOUTH EVERY DAY 90 tablet 1  . ezetimibe (ZETIA) 10 MG tablet TAKE 1 TABLET BY MOUTH EVERY DAY 90 tablet 3  . furosemide (LASIX) 20 MG tablet TAKE 1 TABLET (20 MG) BY MOUTH ONCE EVERY OTHER DAY 45 tablet 3  . insulin degludec (TRESIBA FLEXTOUCH) 100 UNIT/ML FlexTouch Pen Inject 40 Units into the skin daily. 45 mL 3  . Insulin Pen Needle (PEN NEEDLES) 32G X 6 MM MISC 1 each by Does not apply route 2 (two) times daily. 100 each 11  . isosorbide mononitrate (IMDUR) 30 MG 24 hr tablet TAKE 1  TABLET BY MOUTH EVERY DAY 90 tablet 3  . lisinopril (ZESTRIL) 2.5 MG tablet Take 1 tablet (2.5 mg total) by mouth daily. 90 tablet 3  . metFORMIN (GLUCOPHAGE-XR) 500 MG 24 hr tablet TAKE 1 TABLET BY MOUTH TWICE A DAY 180 tablet 3  . nitroGLYCERIN (NITROSTAT) 0.4 MG SL tablet Place 1 tablet (0.4 mg total) under the tongue every 5 (five) minutes as needed for chest pain. Maximum of 3 doses. 25 tablet 3  . potassium chloride (MICRO-K) 10 MEQ CR capsule TAKE 1 CAPSULE (10 MEQ) BY MOUTH ONCE EVERY OTHER DAY WITH LASIX (FUROSEMIDE) 45 capsule 3  . tamsulosin (FLOMAX) 0.4 MG CAPS capsule TAKE 1 CAPSULE BY MOUTH EVERY DAY 90 capsule 0  . VENTOLIN HFA 108 (90 Base) MCG/ACT inhaler Inhale 2 puffs into the lungs every 6 (six) hours as needed for wheezing or shortness of breath. 18 g 3    REVIEW OF SYSTEMS:   '[X]'$  denotes positive finding, $RemoveBeforeDEI'[ ]'[id number]$  denotes negative finding Cardiac  Comments:  Chest pain or chest pressure:    Shortness of breath upon exertion:    Short of breath when lying flat:    Irregular heart rhythm:        Vascular    Pain in calf, thigh, or hip  brought on by ambulation:    Pain in feet at night that wakes you up from your sleep:  x   Blood clot in your veins:    Leg swelling:         Pulmonary    Oxygen at home:    Productive cough:     Wheezing:         Neurologic    Sudden weakness in arms or legs:     Sudden numbness in arms or legs:     Sudden onset of difficulty speaking or slurred speech:    Temporary loss of vision in one eye:     Problems with dizziness:         Gastrointestinal    Blood in stool:      Vomited blood:         Genitourinary    Burning when urinating:     Blood in urine:        Psychiatric    Major depression:         Hematologic    Bleeding problems:    Problems with blood clotting too easily:        Skin    Rashes or ulcers: x       Constitutional    Fever or chills:     PHYSICAL EXAM:   Vitals:   11/06/20 1536 11/06/20 1624   BP:  (!) 146/84  Pulse: (!) 124   Resp: 18   Temp: (!) 100.7 F (38.2 C)   TempSrc: Oral   SpO2: 94%   Weight: 100 kg   Height: $Remove'5\' 8"'kRhLdNX$  (1.727 m)     GENERAL: The patient is a well-nourished male, in no acute distress. The vital signs are documented above. CARDIAC: There is a regular rate and rhythm.  VASCULAR: The left foot is warm down to the ankle.  There is significant cellulitis extending up to the ankle.  It is very tender to the touch. PULMONARY: Nonlabored respirations ABDOMEN: Soft and non-tender with normal pitched bowel sounds.  MUSCULOSKELETAL: There are no major deformities or cyanosis. NEUROLOGIC: No focal weakness or paresthesias are detected. SKIN: see photo below PSYCHIATRIC: The patient has a normal affect.      STUDIES:   I have reviewed his venous duplex which was negative for DVT  ASSESSMENT and PLAN   Gangrenous changes to the left foot with cellulitis: The patient has undergone revascularization recently.  Unfortunately he has had progression of his wound with overlying cellulitis.  He has been seen by Dr. Vickki Muff with podiatry who does not feel that the limb is salvageable.  I agree with this assessment.  I discussed with the patient that he will need a below-knee amputation.  He is currently on IV antibiotics.  I would continue this in an attempt to limit his infection so that a below-knee amputation is feasible.  I did discuss that if we could not get a below-knee amputation to heal but he would need an above-knee amputation.  We will follow up with the patient tomorrow.   Leia Alf, MD, FACS [facility] [phone] Pager [phone]

## 2020-11-06 NOTE — ED Triage Notes (Signed)
Left foot necrosis , scheduled for amputation this week

## 2020-11-06 NOTE — Consult Note (Signed)
CODE SEPSIS - PHARMACY COMMUNICATION  **Broad Spectrum Antibiotics should be administered within 1 hour of Sepsis diagnosis**  Time Code Sepsis Called/Page Received: 1658  Antibiotics Ordered:  Cefepime 1g x1 Vancomycin 1g x1  Time of 1st antibiotic administration: 1655   Raiford Noble, PharmD Pharmacy Resident  11/06/2020 5:28 PM

## 2020-11-06 NOTE — Consult Note (Signed)
ORTHOPAEDIC CONSULTATION  REQUESTING PHYSICIAN: Loletha Grayer, MD  Chief Complaint: Worsening pain left foot  HPI: Danny [name] Sr. is a 70 y.o. male who complains of  Worsening left foot pain. Pt underwent recent attempts at revascularization.  Seen in clinic 2 days ago with gangrenous great toe.  Pain medicine provided and decision to monitor for more proximal gangrene and further demarcation was made.  Pt now with worsening pain.    Past Medical History:  Diagnosis Date  . CAD (coronary artery disease)    a. 01/2010 PCI of LAD; b. 09/2014 PCI/DES of mLAD due to ISR. D1 80, D2 80(jailed), LCX 67m; c. 07/2016 NSTEMI/Cath: LM nl, LAD 64m ISR, 40d, D1 90ost, 80p, D2 90ost, RI min irregs, LCX min irregs, OM1 90 small, OM2/3 min irregs, RCA min irregs, RPLB1 90, EF 25-35%.  . Chronic combined systolic and diastolic CHF (congestive heart failure) ([place])    a. 07/2016 Echo: EF 30%, severe septal/anterior HK, Gr1 DD.  [name] CKD (chronic kidney disease), stage [name] ([place])   . COPD (chronic obstructive pulmonary disease) ([place])   . DM2 (diabetes mellitus, type 2) ([place])   . Erectile dysfunction   . HLD (hyperlipidemia)   . HTN (hypertension)   . Ischemic cardiomyopathy    a. 07/2016 Echo: EF 30% w/ sev septal/ant HK. Gr1 DD.   Past Surgical History:  Procedure Laterality Date  . CAD: stent to the LAD    . CARDIAC CATHETERIZATION  10/01/2014  . CARDIAC CATHETERIZATION N/A 07/23/2016   Procedure: Left Heart Cath and Coronary Angiography;  Surgeon: [name], MD;  Location: [place] [facility];  Service: Cardiovascular;  Laterality: N/A;  . CORONARY ANGIOPLASTY WITH STENT PLACEMENT  10/01/2014  . LOWER EXTREMITY ANGIOGRAPHY Left 10/31/2020   Procedure: LOWER EXTREMITY ANGIOGRAPHY;  Surgeon: [name], MD;  Location: [place] [facility];  Service: Cardiovascular;  Laterality: Left;   Social History   Socioeconomic History  . Marital status: Single    Spouse name: Not on file  . Number  of children: Not on file  . Years of education: Not on file  . Highest education level: Not on file  Occupational History  . Not on file  Tobacco Use  . Smoking status: Former Smoker    Packs/day: 3.00    Years: 0.00    Pack years: 0.00    Types: Cigarettes    Quit date: 09/07/2009    Years since quitting: 11.1  . Smokeless tobacco: Never Used  . Tobacco comment: quit june 2011  Vaping Use  . Vaping Use: Never used  Substance and Sexual Activity  . Alcohol use: Yes    Comment: every other weekend  . Drug use: No  . Sexual activity: Not on file  Other Topics Concern  . Not on file  Social History Narrative  . Not on file   Social Determinants of Health   Financial Resource Strain: Low Risk   . Difficulty of Paying Living Expenses: Not hard at all  Food Insecurity: No Food Insecurity  . Worried About Charity fundraiser in the Last Year: Never true  . Ran Out of Food in the Last Year: Never true  Transportation Needs: No Transportation Needs  . Lack of Transportation (Medical): No  . Lack of Transportation (Non-Medical): No  Physical Activity: Not on file  Stress: Stress Concern Present  . Feeling of Stress : Rather much  Social Connections: Not on file   Family History  Problem Relation Age of Onset  . Heart attack Father        complications  . Cancer Brother    No Known Allergies Prior to Admission medications   Medication Sig Start Date End Date Taking? Authorizing Provider  ACCU-CHEK GUIDE test strip CHECK CBGS 4 TIMES A DAY 11/16/19   [name], MD  albuterol (PROVENTIL) (2.5 MG/3ML) 0.083% nebulizer solution Take 3 mLs (2.5 mg total) by nebulization every 6 (six) hours as needed for wheezing or shortness of breath. 09/30/20   [name], MD  amoxicillin-clavulanate (AUGMENTIN) 875-125 MG tablet Take 1 tablet by mouth 2 (two) times daily. 10/21/20   [provider]  aspirin 81 MG tablet Take 81 mg by mouth daily.    [provider]  atorvastatin (LIPITOR) 80 MG tablet Take 1 tablet (80 mg total) by mouth daily. 09/17/19   [name], MD  baclofen (LIORESAL) 10 MG tablet TAKE 1 TABLET BY MOUTH THREE TIMES A DAY AS NEEDED FOR MUSCLE SPASMS 10/24/20   [name], MD  blood glucose meter kit and supplies KIT Dispense based on patient and insurance preference. Check CBGs 4 times daily. Dx code E11.9. 09/17/19   [name], MD  carvedilol (COREG) 12.5 MG tablet TAKE 1 TABLET (12.5 MG TOTAL) BY MOUTH 2 (TWO) TIMES DAILY WITH A MEAL. 02/29/20   [name], MD  clopidogrel (PLAVIX) 75 MG tablet TAKE 1 TABLET BY MOUTH EVERY DAY 02/09/20   Gollan, Kathlene November, MD  Dulaglutide (TRULICITY) 4.5 TI/4.5YK SOPN Inject 4.5 mg as directed once a week. 07/11/20   [name], MD  empagliflozin (JARDIANCE) 25 MG TABS tablet Take 25 mg by mouth daily. 09/17/19   [name], MD  escitalopram (LEXAPRO) 10 MG tablet TAKE 1 TABLET BY MOUTH EVERY DAY 05/25/20   Einar Pheasant, MD  ezetimibe (ZETIA) 10 MG tablet TAKE 1 TABLET BY MOUTH EVERY DAY 02/29/20   [name], MD  furosemide (LASIX) 20 MG tablet TAKE 1 TABLET (20 MG) BY MOUTH ONCE EVERY OTHER DAY 03/21/20   [name], MD  insulin degludec (TRESIBA FLEXTOUCH) 100 UNIT/ML FlexTouch Pen Inject 40 Units into the skin daily. 10/27/20   [name], MD  Insulin Pen Needle (PEN NEEDLES) 32G X 6 MM MISC 1 each by Does not apply route 2 (two) times daily. 11/17/19   [name], MD  isosorbide mononitrate (IMDUR) 30 MG 24 hr tablet TAKE 1 TABLET BY MOUTH EVERY DAY 02/09/20   [name], MD  lisinopril (ZESTRIL) 2.5 MG tablet Take 1 tablet (2.5 mg total) by mouth daily. 03/29/20   [name], MD  metFORMIN (GLUCOPHAGE-XR) 500 MG 24 hr tablet TAKE 1 TABLET BY MOUTH TWICE A DAY 08/08/20   [name], MD  nitroGLYCERIN (NITROSTAT) 0.4 MG SL tablet Place 1 tablet (0.4 mg total) under the tongue every 5 (five) minutes as needed  for chest pain. Maximum of 3 doses. 10/26/20   Marrianne Mood D, PA-C  potassium chloride (MICRO-K) 10 MEQ CR capsule TAKE 1 CAPSULE (10 MEQ) BY MOUTH ONCE EVERY OTHER DAY WITH LASIX (FUROSEMIDE) 02/26/20   Gollan, Kathlene November, MD  tamsulosin (FLOMAX) 0.4 MG CAPS capsule TAKE 1 CAPSULE BY MOUTH EVERY DAY 07/04/20   Einar Pheasant, MD  VENTOLIN HFA 108 (90 Base) MCG/ACT inhaler Inhale 2 puffs into the lungs every 6 (six) hours as needed for wheezing or shortness of breath. 09/17/19  [name], MD   US Venous Img Lower Unilateral Left  Result Date: 11/06/2020 CLINICAL DATA:  Necrotic gangrenous toe.  Increased pain.  Erythema. EXAM: LEFT LOWER EXTREMITY VENOUS DOPPLER ULTRASOUND TECHNIQUE: Gray-scale sonography with compression, as well as color and duplex ultrasound, were performed to evaluate the deep venous system(s) from the level of the common femoral vein through the popliteal and proximal calf veins. COMPARISON:  None. FINDINGS: VENOUS Normal compressibility of the common femoral, superficial femoral, and popliteal veins, as well as the visualized calf veins. Visualized portions of profunda femoral vein and great saphenous vein unremarkable. No filling defects to suggest DVT on grayscale or color Doppler imaging. Doppler waveforms show normal direction of venous flow, normal respiratory plasticity and response to augmentation. Limited views of the contralateral common femoral vein are unremarkable. OTHER None. Limitations: none IMPRESSION: Negative. Electronically Signed   By: Danny [name] M.D   On: 11/06/2020 17:45   DG Chest Portable 1 View  Result Date: 11/06/2020 CLINICAL DATA:  Mild hypoxia.  Left foot necrosis. EXAM: PORTABLE CHEST 1 VIEW COMPARISON:  July 29, 2019 FINDINGS: Linear opacity in the right mid lung is unchanged, consistent with scarring. Minimal atelectasis in the left base. Emphysematous changes in the lungs. No nodules or masses. Cardiomediastinal silhouette is  stable. No pneumothorax. IMPRESSION: No active disease. Electronically Signed   By: Danny [name] M.D   On: 11/06/2020 17:44    Positive ROS: All other systems have been reviewed and were otherwise negative with the exception of those mentioned in the HPI and as above.  12 point ROS was performed.  Physical Exam: General: Alert and oriented.  No apparent distress.  Vascular:  Left foot:Dorsalis Pedis:  absent Posterior Tibial:  absent  Right foot: not evaluated.  Neuro:intact  Gross sensation  Derm:Right foot without ulcer.  Left foot with gangrene of entire great toe and 1st ray.  Worsening erythema to dorsal foot with mottling of skin.  Gangrenous changes to medial arch.          Ortho/MS: Severe pain to left foot.   Assessment: Gangrene left foot. Severe PVD.  Plan: At this time the gangrene has progressed and I do not believe there is any chance for limb salvage.  Would recommend vascular evaluation for BKA/AKA amputation.  Vascular has been consulted and will sign off at this time.  Please reconsult if needed.     Elesa Hacker, DPM Cell [phone]   11/06/2020 7:02 PM

## 2020-11-07 ENCOUNTER — Other Ambulatory Visit: Payer: Self-pay

## 2020-11-07 ENCOUNTER — Inpatient Hospital Stay: Payer: 59

## 2020-11-07 DIAGNOSIS — R6520 Severe sepsis without septic shock: Secondary | ICD-10-CM

## 2020-11-07 DIAGNOSIS — I5032 Chronic diastolic (congestive) heart failure: Secondary | ICD-10-CM | POA: Diagnosis not present

## 2020-11-07 DIAGNOSIS — B370 Candidal stomatitis: Secondary | ICD-10-CM

## 2020-11-07 DIAGNOSIS — R0602 Shortness of breath: Secondary | ICD-10-CM | POA: Diagnosis not present

## 2020-11-07 DIAGNOSIS — E1169 Type 2 diabetes mellitus with other specified complication: Secondary | ICD-10-CM

## 2020-11-07 DIAGNOSIS — I96 Gangrene, not elsewhere classified: Secondary | ICD-10-CM | POA: Diagnosis not present

## 2020-11-07 DIAGNOSIS — Z01810 Encounter for preprocedural cardiovascular examination: Secondary | ICD-10-CM

## 2020-11-07 DIAGNOSIS — A419 Sepsis, unspecified organism: Principal | ICD-10-CM

## 2020-11-07 DIAGNOSIS — E785 Hyperlipidemia, unspecified: Secondary | ICD-10-CM

## 2020-11-07 LAB — GLUCOSE, CAPILLARY
Glucose-Capillary: 183 mg/dL — ABNORMAL HIGH (ref 70–99)
Glucose-Capillary: 231 mg/dL — ABNORMAL HIGH (ref 70–99)
Glucose-Capillary: 192 mg/dL — ABNORMAL HIGH (ref 70–99)
Glucose-Capillary: 139 mg/dL — ABNORMAL HIGH (ref 70–99)

## 2020-11-07 LAB — HIV ANTIBODY (ROUTINE TESTING W REFLEX): HIV Screen 4th Generation wRfx: NONREACTIVE

## 2020-11-07 LAB — CREATININE, SERUM
Creatinine, Ser: 1.13 mg/dL (ref 0.61–1.24)
GFR, Estimated: >60 mL/min (ref >60)

## 2020-11-07 LAB — SARS CORONAVIRUS 2 (TAT 6-24 HRS): SARS Coronavirus 2: NEGATIVE

## 2020-11-07 MED ORDER — IPRATROPIUM-ALBUTEROL 0.5-2.5 (3) MG/3ML IN SOLN
3.0000 mL | Freq: Four times a day (QID) | RESPIRATORY_TRACT | Status: DC
  Administered 2020-11-07 – 2020-11-08 (×4): 3 mL via RESPIRATORY_TRACT
  Filled 2020-11-07 (×4): qty 3

## 2020-11-07 MED ORDER — FUROSEMIDE 10 MG/ML IJ SOLN
40.0000 mg | Freq: Once | INTRAMUSCULAR | Status: AC
  Administered 2020-11-07: 11:00:00 40 mg via INTRAVENOUS
  Filled 2020-11-07: qty 4

## 2020-11-07 MED ORDER — CARVEDILOL 6.25 MG PO TABS
25.0000 mg | ORAL_TABLET | Freq: Two times a day (BID) | ORAL | Status: DC
  Administered 2020-11-07 – 2020-11-16 (×17): 25 mg via ORAL
  Filled 2020-11-07 (×17): qty 1

## 2020-11-07 MED ORDER — INSULIN ASPART 100 UNIT/ML ~~LOC~~ SOLN
0.0000 [IU] | Freq: Three times a day (TID) | SUBCUTANEOUS | Status: DC
  Administered 2020-11-07: 2 [IU] via SUBCUTANEOUS
  Administered 2020-11-07: 13:00:00 3 [IU] via SUBCUTANEOUS
  Filled 2020-11-07 (×2): qty 1

## 2020-11-07 MED ORDER — METRONIDAZOLE IN NACL 5-0.79 MG/ML-% IV SOLN
500.0000 mg | Freq: Three times a day (TID) | INTRAVENOUS | Status: DC
  Administered 2020-11-07 – 2020-11-09 (×9): 500 mg via INTRAVENOUS
  Filled 2020-11-07 (×11): qty 100

## 2020-11-07 MED ORDER — INSULIN ASPART 100 UNIT/ML ~~LOC~~ SOLN: 0.0000 [IU] | Freq: Every day | SUBCUTANEOUS | Status: DC

## 2020-11-07 NOTE — Progress Notes (Signed)
Inpatient Diabetes Program Recommendations  AACE/ADA: New Consensus Statement on Inpatient Glycemic Control (2015)  Target Ranges:  Prepandial:   less than 140 mg/dL      Peak postprandial:   less than 180 mg/dL (1-2 hours)      Critically ill patients:  140 - 180 mg/dL   Lab Results  Component Value Date   GLUCAP 231 (H) 11/07/2020   HGBA1C 8.4 (H) 09/30/2020    Review of Glycemic Control Results for [name], Danny RAYL SR. (MRN [id number]) as of 11/07/2020 13:57  Ref. Range 11/07/2020 08:57 11/07/2020 11:29  Glucose-Capillary Latest Ref Range: 70 - 99 mg/dL 177 (H) 939 (H)   Diabetes history: DM 2 Outpatient Diabetes medications: Trulicity 4.5 mg weekly, Jardiance 25 mg Daily, Tresiba 40 units Daily, Metformin 500 mg bid Current orders for Inpatient glycemic control:  Lantus 20 units Daily Novolog 0-9 units tid + hs  A1c 8.4% on 09/30/2020  Inpatient Diabetes Program Recommendations:    -  Increase Lantus to 28 units -  Increase Novolog correction to "moderate" 0-15 units tid + hs  Surgery scheduled for later this week. Thanks,  Christena Deem RN, MSN, BC-ADM Inpatient Diabetes Coordinator Team Pager [phone] (8a-5p)

## 2020-11-07 NOTE — TOC Initial Note (Signed)
Transition of Care (TOC) - Initial/Assessment Note    Patient Details  Name: Danny [name]. MRN: [id number] Date of Birth: 03-05-1951  Transition of Care [facility]) CM/SW Contact:    Allayne Butcher, RN Phone Number: 11/07/2020, 12:18 PM  Clinical Narrative:                 Patient admitted to the hospital with sepsis from gangrene left foot.  Patient will require a Left BKA to be done later this week.   RNCM was able to meet with patient at the bedside.  Patient lives with his son.  Patient has been independent at home requiring no assistive devices.  Once patient has the BKA he will not be able to drive but he reports his son can take him to appointments.  Patient will agree to rehab if recommended by PT after surgery.   TOC will cont to follow.    Expected Discharge Plan: Skilled Nursing Facility Barriers to Discharge: Continued Medical Work up   Patient Goals and CMS Choice Patient states their goals for this hospitalization and ongoing recovery are:: to get well      Expected Discharge Plan and Services Expected Discharge Plan: Skilled Nursing Facility   Discharge Planning Services: CM Consult   Living arrangements for the past 2 months: Single Family Home                                      Prior Living Arrangements/Services Living arrangements for the past 2 months: Single Family Home Lives with:: Adult Children Patient language and need for interpreter reviewed:: Yes Do you feel safe going back to the place where you live?: Yes      Need for Family Participation in Patient Care: Yes (Comment) (will need a BKA) Care giver support system in place?: Yes (comment) (son)   Criminal Activity/Legal Involvement Pertinent to Current Situation/Hospitalization: No - Comment as needed  Activities of Daily Living Home Assistive Devices/Equipment: None ADL Screening (condition at time of admission) Patient's cognitive ability adequate to safely complete daily  activities?: Yes Is the patient deaf or have difficulty hearing?: No Does the patient have difficulty seeing, even when wearing glasses/contacts?: Yes Does the patient have difficulty concentrating, remembering, or making decisions?: No Patient able to express need for assistance with ADLs?: Yes Does the patient have difficulty dressing or bathing?: No Independently performs ADLs?: Yes (appropriate for developmental age) Does the patient have difficulty walking or climbing stairs?: Yes Weakness of Legs: Left Weakness of Arms/Hands: None  Permission Sought/Granted Permission sought to share information with : Case Manager,Family Electrical engineer Permission granted to share information with : Yes, Verbal Permission Granted  Share Information with NAME: Dean  Permission granted to share info w AGENCY: SNF's  Permission granted to share info w Relationship: son     Emotional Assessment Appearance:: Appears stated age Attitude/Demeanor/Rapport: Engaged Affect (typically observed): Accepting Orientation: : Oriented to Self,Oriented to  Time,Oriented to Place,Oriented to Situation Alcohol / Substance Use: Not Applicable Psych Involvement: No (comment)  Admission diagnosis:  Sepsis (HCC) [A41.9] Severe sepsis (HCC) [A41.9, R65.20] Gangrene of left foot (HCC) [I96] Patient Active Problem List   Diagnosis Date Noted  . Sepsis (HCC) 11/06/2020  . Gangrene of left foot (HCC)   . PVD (peripheral vascular disease) (HCC)   . CKD (chronic kidney disease), stage II   . Atherosclerosis of native arteries of  the extremities with gangrene (HCC) 10/28/2020  . Low back pain 06/29/2020  . Onychomycosis 03/29/2020  . Fall 03/29/2020  . AKI (acute kidney injury) (HCC) 12/28/2019  . Coronary artery disease of native artery of native heart with stable angina pectoris (HCC) 12/12/2018  . Pure hypercholesterolemia 12/12/2018  . Chest pain 10/31/2018  . Atherosclerosis of  native arteries of extremity with intermittent claudication (HCC) 02/11/2018  . Decreased pedal pulses 07/02/2017  . Orthostasis 05/07/2017  . Anxiety and depression 05/07/2017  . CKD (chronic kidney disease) stage 3, GFR 30-59 ml/min (HCC) 03/06/2017  . Diabetic neuropathy (HCC) 03/06/2017  . Chronic diastolic CHF (congestive heart failure) (HCC) 03/05/2017  . Morbid obesity (HCC) 06/18/2016  . H/O medication noncompliance 06/18/2016  . COPD (chronic obstructive pulmonary disease) (HCC) 06/20/2015  . GERD (gastroesophageal reflux disease) 12/20/2014  . Diabetes mellitus type 2, uncontrolled, with complications (HCC) 06/05/2010  . Hyperlipidemia 02/10/2010  . Essential hypertension 02/10/2010  . CAD (coronary artery disease) 02/10/2010   PCP:  [name], MD Pharmacy:   [facility] - [place], [place] - [address] [place] [address] Phone: [phone] Fax: [phone]  [facility] [address] [place] - [address] [place] [address] Phone: [phone] Fax: [phone]     Social Determinants of Health (SDOH) Interventions    Readmission Risk Interventions Readmission Risk Prevention Plan 07/30/2019  Transportation Screening Complete  PCP or Specialist Appt within 5-7 Days Complete  Medication Review (RN CM) Complete  Some recent data might be hidden

## 2020-11-07 NOTE — Progress Notes (Signed)
[facility] Daily Progress Note  Subjective: Patient sleeping in bed this AM.  Easily arousable.  No acute issues overnight.  Objective: Vitals:   11/07/20 0011 11/07/20 0414 11/07/20 0803 11/07/20 1200  BP: (!) 155/89 (!) 175/93 (!) 169/88 127/80  Pulse: (!) 102 93 95 96  Resp: 18 17 18  (!) 26  Temp: 99.6 F (37.6 C) 99.5 F (37.5 C) 98 F (36.7 C) 98.8 F (37.1 C)  TempSrc: Oral Oral  Oral  SpO2: 93% 94% 96% 98%  Weight:      Height:        Intake/Output Summary (Last 24 hours) at 11/07/2020 1211 Last data filed at 11/07/2020 0900 Gross per 24 hour  Intake 1731.39 ml  Output 1300 ml  Net 431.39 ml   Physical Exam: A&Ox3, NAD CV: RRR Pulmonary: CTA Bilaterally Abdomen: Soft, Nontender, Nondistended Vascular:  Left lower extremity: Thigh soft. Calf soft. Extremity is warm.  Erythema is noted to the left foot.  Foul odor noted.  Gangrenous changes to the first and second toes. Gangrenous changes tracked back towards the heel.    Laboratory: CBC    Component Value Date/Time   WBC 24.4 (H) 11/06/2020 1541   HGB 13.5 11/06/2020 1541   HGB 15.1 11/23/2014 2006   HCT 42.3 11/06/2020 1541   HCT 46.1 11/23/2014 2006   PLT 407 (H) 11/06/2020 1541   PLT 229 11/23/2014 2006   BMET    Component Value Date/Time   NA 135 11/06/2020 1541   NA 135 11/23/2014 2006   K 3.9 11/06/2020 1541   K 4.0 11/23/2014 2006   CL 102 11/06/2020 1541   CL 100 (L) 11/23/2014 2006   CO2 21 (L) 11/06/2020 1541   CO2 24 11/23/2014 2006   GLUCOSE 212 (H) 11/06/2020 1541   GLUCOSE 370 (H) 11/23/2014 2006   BUN 23 11/06/2020 1541   BUN 40 (H) 11/23/2014 2006   CREATININE 1.13 11/07/2020 0847   CREATININE 2.16 (H) 11/23/2014 2006   CALCIUM 8.6 (L) 11/06/2020 1541   CALCIUM 8.6 (L) 11/23/2014 2006   GFRNONAA >60 11/07/2020 0847   GFRNONAA 31 (L) 11/23/2014 2006   GFRAA 37 (L) 12/24/2019 1608   GFRAA 36 (L) 11/23/2014 2006   Assessment/Planning: The patient is a  70 year old male with known peripheral artery disease status post recent endovascular intervention who presents to the hospital with progressively worsening gangrene and discomfort to the left foot  1) patient with known history of peripheral artery disease, most recent intervention on November 01, 2020 - presented to the [facility] Emergency Department with progressively worsening pain of the left foot.   2) on exam, the patient was noted to have gangrenous changes to the first and second toes with gangrene tracking back towards his heel on the underside of the foot. 3) patient was seen by podiatry who felt the left foot is no longer salvageable and vascular was consulted for possible below-the-knee versus above-the-knee amputation 4) the patient has been placed schedule Thursday afternoon. 5) will monitor the patient closely over the next few days - if the patient starts to show any signs of worsening sepsis will have to reconsider and take the patient to the OR emergently.   Discussed with Dr. Sunday Javoni Lucken PA-C 11/07/2020 12:11 PM

## 2020-11-07 NOTE — Consult Note (Signed)
Cardiology Consultation:   Patient ID: Danny Estimable Schleifer Sr. MRN: [id number]; DOB: [birth year]  Admit date: 11/06/2020 Date of Consult: 11/07/2020  PCP:  Danny Haven, MD   [place]  Cardiologist:  Ida Rogue, MD  Advanced Practice Provider:  No care team member to display Electrophysiologist:  None   :[id number]}  Patient Profile:   Danny Estimable Deihl Sr. is a 70 y.o. male with a hx of CAD s/p LAD stenting, ICM, chronic combined CHF, stage 3 CKF, COPD, remote tobacco use, DM2, HTN, HLD, ED, obesity who is being seen today for the evaluation of pre-operative cardiac evaluation at the request of Dr. Mickle Plumb.  History of Present Illness:   Danny [name] is followed by Dr. Rockey Situ for the above cardiac issues.  He has a history of CAD status post LAD stenting in 2011 with subsequent repeat stenting to 2016 in the setting of ISR.  Non-STEMI MI 07/2016 status post left heart cath with moderate to severe small vessel disease and patent stent.  Moderately elevated filling pressures were noted.  LVEF 25 to 30%.  Echo showed EF 38%.  Medical management recommended.  He was subsequently placed on Ranexa that was later titrated and then discontinued.  He has chronic stable dyspnea on exertion.  Follow-up echo showed improved EF 55 to 60%.  He has a history of PVD/PAD previously followed by Dr. Lazaro Arms but lost to follow-up.  [place] 19 carotid study showed 1 to 39% bilateral right ICA.  High resistant flow in the left vertebral artery was noted to suggest possible obstruction.  Elevated velocities noted in the right vertebral artery.  2019 lower extremity arterial studies showed mild RLE arterial disease with a TBI that was abnormal.  Left toe brachial index was abnormal.  It was noted the waveforms at the ankle suggested some component of arterial occlusive disease.  Recommended vascular surgery follow-up, but he did not see vascular at that time.  Patient was last seen 10/26/2020 in  clinic and was overall doing well from a cardiac standpoint.  Had been seeing vascular surgery for claudication symptoms.  The symptoms have been going on for about a week.  Legs were hurting at rest and with walking.  He also had some swelling, worse on the left, which was stable for him.  Also reported left toe blister. Echo was updated and [place] b/l carotids were ordered.  Patient saw podiatry 10/21/2020.  At that time he had gangrene of the toe.  He was referred to vascular for an angiogram.  Patient saw vascular on 3/28 and Dr. Lucky Cowboy did an angiogram that had 3 angioplasties and 1 stent.  The patient was discharged home but came back to the hospital 4/3 due to worsening foot pain.  Was seen by Dr. Trula Slade for left toe gangrene with overlying cellulitis.  It was felt the limb is not salvageable, and recommendations for below the knee amputation.  He is on IV antibiotics.  Cardiology was consulted for preop evaluation.   Past Medical History:  Diagnosis Date  . CAD (coronary artery disease)    a. 01/2010 PCI of LAD; b. 09/2014 PCI/DES of mLAD due to ISR. D1 80, D2 80(jailed), LCX 41m; c. 07/2016 NSTEMI/Cath: LM nl, LAD 57m ISR, 40d, D1 90ost, 80p, D2 90ost, RI min irregs, LCX min irregs, OM1 90 small, OM2/3 min irregs, RCA min irregs, RPLB1 90, EF 25-35%.  . Chronic combined systolic and diastolic CHF (congestive heart failure) ([place])    a.  07/2016 Echo: EF 30%, severe septal/anterior HK, Gr1 DD.  [name] CKD (chronic kidney disease), stage III ([place])   . COPD (chronic obstructive pulmonary disease) ([place])   . DM2 (diabetes mellitus, type 2) ([place])   . Erectile dysfunction   . HLD (hyperlipidemia)   . HTN (hypertension)   . Ischemic cardiomyopathy    a. 07/2016 Echo: EF 30% w/ sev septal/ant HK. Gr1 DD.    Past Surgical History:  Procedure Laterality Date  . CAD: stent to the LAD    . CARDIAC CATHETERIZATION  10/01/2014  . CARDIAC CATHETERIZATION N/A 07/23/2016   Procedure: Left Heart Cath and Coronary  Angiography;  Surgeon: [name], MD;  Location: [facility];  Service: Cardiovascular;  Laterality: N/A;  . CORONARY ANGIOPLASTY WITH STENT PLACEMENT  10/01/2014  . LOWER EXTREMITY ANGIOGRAPHY Left 10/31/2020   Procedure: LOWER EXTREMITY ANGIOGRAPHY;  Surgeon: [name], MD;  Location: [facility];  Service: Cardiovascular;  Laterality: Left;     Home Medications:  Prior to Admission medications   Medication Sig Start Date End Date Taking? Authorizing Provider  ACCU-CHEK GUIDE test strip CHECK CBGS 4 TIMES A DAY 11/16/19  Yes Danny Haven, MD  albuterol (PROVENTIL) (2.5 MG/3ML) 0.083% nebulizer solution Take 3 mLs (2.5 mg total) by nebulization every 6 (six) hours as needed for wheezing or shortness of breath. 09/30/20  Yes Danny Haven, MD  aspirin 81 MG tablet Take 81 mg by mouth daily.   Yes [provider]  atorvastatin (LIPITOR) 80 MG tablet Take 1 tablet (80 mg total) by mouth daily. 09/17/19  Yes Danny Haven, MD  baclofen (LIORESAL) 10 MG tablet TAKE 1 TABLET BY MOUTH THREE TIMES A DAY AS NEEDED FOR MUSCLE SPASMS 10/24/20  Yes Danny Haven, MD  blood glucose meter kit and supplies KIT Dispense based on patient and insurance preference. Check CBGs 4 times daily. Dx code E11.9. 09/17/19  Yes Danny Haven, MD  carvedilol (COREG) 12.5 MG tablet TAKE 1 TABLET (12.5 MG TOTAL) BY MOUTH 2 (TWO) TIMES DAILY WITH A MEAL. 02/29/20  Yes Danny Haven, MD  clopidogrel (PLAVIX) 75 MG tablet TAKE 1 TABLET BY MOUTH EVERY DAY 02/09/20  Yes Gollan, Kathlene November, MD  Dulaglutide (TRULICITY) 4.5 EH/6.3JS SOPN Inject 4.5 mg as directed once a week. 07/11/20  Yes Danny Haven, MD  empagliflozin (JARDIANCE) 25 MG TABS tablet Take 25 mg by mouth daily. 09/17/19  Yes Danny Haven, MD  escitalopram (LEXAPRO) 10 MG tablet TAKE 1 TABLET BY MOUTH EVERY DAY 05/25/20  Yes Einar Pheasant, MD  ezetimibe (ZETIA) 10 MG tablet TAKE 1 TABLET BY MOUTH  EVERY DAY 02/29/20  Yes Danny Haven, MD  furosemide (LASIX) 20 MG tablet TAKE 1 TABLET (20 MG) BY MOUTH ONCE EVERY OTHER DAY 03/21/20  Yes Gollan, Kathlene November, MD  insulin degludec (TRESIBA FLEXTOUCH) 100 UNIT/ML FlexTouch Pen Inject 40 Units into the skin daily. 10/27/20  Yes Danny Haven, MD  isosorbide mononitrate (IMDUR) 30 MG 24 hr tablet TAKE 1 TABLET BY MOUTH EVERY DAY 02/09/20  Yes Gollan, Kathlene November, MD  lisinopril (ZESTRIL) 2.5 MG tablet Take 1 tablet (2.5 mg total) by mouth daily. 03/29/20  Yes Danny Haven, MD  metFORMIN (GLUCOPHAGE-XR) 500 MG 24 hr tablet TAKE 1 TABLET BY MOUTH TWICE A DAY 08/08/20  Yes Danny Haven, MD  nitroGLYCERIN (NITROSTAT) 0.4 MG SL tablet Place 1 tablet (0.4 mg total) under the tongue  every 5 (five) minutes as needed for chest pain. Maximum of 3 doses. 10/26/20  Yes Visser, Jacquelyn D, PA-C  oxyCODONE-acetaminophen (PERCOCET) 7.5-325 MG tablet Take 1 tablet by mouth every 6 (six) hours as needed. 11/01/20  Yes [provider]  potassium chloride (MICRO-K) 10 MEQ CR capsule TAKE 1 CAPSULE (10 MEQ) BY MOUTH ONCE EVERY OTHER DAY WITH LASIX (FUROSEMIDE) 02/26/20  Yes Gollan, Kathlene November, MD  tamsulosin (FLOMAX) 0.4 MG CAPS capsule TAKE 1 CAPSULE BY MOUTH EVERY DAY 07/04/20  Yes Einar Pheasant, MD  VENTOLIN HFA 108 (90 Base) MCG/ACT inhaler Inhale 2 puffs into the lungs every 6 (six) hours as needed for wheezing or shortness of breath. 09/17/19  Yes Danny Haven, MD  Insulin Pen Needle (PEN NEEDLES) 32G X 6 MM MISC 1 each by Does not apply route 2 (two) times daily. 11/17/19   Danny Haven, MD    Inpatient Medications: Scheduled Meds: . aspirin EC  81 mg Oral Daily  . atorvastatin  80 mg Oral QHS  . carvedilol  12.5 mg Oral BID WC  . escitalopram  10 mg Oral Daily  . ezetimibe  10 mg Oral Daily  . insulin aspart  0-5 Units Subcutaneous QHS  . insulin aspart  0-9 Units Subcutaneous TID WC  . insulin glargine  20 Units Subcutaneous  Daily  . ipratropium-albuterol  3 mL Nebulization Q6H  . isosorbide mononitrate  30 mg Oral Daily  . lisinopril  2.5 mg Oral Daily  . nystatin  5 mL Oral QID  . potassium chloride  10 mEq Oral Daily  . tamsulosin  0.4 mg Oral Daily   Continuous Infusions: . ceFEPime (MAXIPIME) IV 2 g (11/07/20 1423)  . metronidazole 500 mg (11/07/20 0951)  . vancomycin     PRN Meds: acetaminophen **OR** acetaminophen, baclofen, morphine injection, nitroGLYCERIN, ondansetron **OR** ondansetron (ZOFRAN) IV, oxyCODONE  Allergies:   No Known Allergies  Social History:   Social History   Socioeconomic History  . Marital status: Single    Spouse name: Not on file  . Number of children: Not on file  . Years of education: Not on file  . Highest education level: Not on file  Occupational History  . Not on file  Tobacco Use  . Smoking status: Former Smoker    Packs/day: 3.00    Years: 0.00    Pack years: 0.00    Types: Cigarettes    Quit date: 09/07/2009    Years since quitting: 11.1  . Smokeless tobacco: Never Used  . Tobacco comment: quit june 2011  Vaping Use  . Vaping Use: Never used  Substance and Sexual Activity  . Alcohol use: Yes    Comment: every other weekend  . Drug use: No  . Sexual activity: Not on file  Other Topics Concern  . Not on file  Social History Narrative  . Not on file   Social Determinants of Health   Financial Resource Strain: Low Risk   . Difficulty of Paying Living Expenses: Not hard at all  Food Insecurity: No Food Insecurity  . Worried About Charity fundraiser in the Last Year: Never true  . Ran Out of Food in the Last Year: Never true  Transportation Needs: No Transportation Needs  . Lack of Transportation (Medical): No  . Lack of Transportation (Non-Medical): No  Physical Activity: Not on file  Stress: Stress Concern Present  . Feeling of Stress : Rather much  Social Connections: Not on file  Intimate Partner Violence: Not on file    Family  History:    Family History  Problem Relation Age of Onset  . Heart attack Father        complications  . Cancer Brother      ROS:  Please see the history of present illness.   All other ROS reviewed and negative.     Physical Exam/Data:   Vitals:   11/07/20 0011 11/07/20 0414 11/07/20 0803 11/07/20 1200  BP: (!) 155/89 (!) 175/93 (!) 169/88 127/80  Pulse: (!) 102 93 95 96  Resp: $Remo'18 17 18 'OzFaP$ (!) 26  Temp: 99.6 F (37.6 C) 99.5 F (37.5 C) 98 F (36.7 C) 98.8 F (37.1 C)  TempSrc: Oral Oral  Oral  SpO2: 93% 94% 96% 98%  Weight:      Height:        Intake/Output Summary (Last 24 hours) at 11/07/2020 1512 Last data filed at 11/07/2020 1500 Gross per 24 hour  Intake 1731.39 ml  Output 1950 ml  Net -218.61 ml   Last 3 Weights 11/06/2020 10/31/2020 10/28/2020  Weight (lbs) 220 lb 7.4 oz 228 lb 228 lb  Weight (kg) 100 kg 103.42 kg 103.42 kg     Body mass index is 33.52 kg/m.  General:  Well nourished, well developed, in no acute distress HEENT: normal Lymph: no adenopathy Neck: no JVD Endocrine:  No thryomegaly Vascular: No carotid bruits; FA pulses 2+ bilaterally without bruits  Cardiac:  normal S1, S2; RRR; no murmur  Lungs:  clear to auscultation bilaterally, no wheezing, rhonchi or rales  Abd: soft, nontender, no hepatomegaly  Ext: mild left lower leg edema Musculoskeletal:  Left toe gangrenous; difficult to palpate pulse Skin: warm and dry  Neuro:  CNs 2-12 intact, no focal abnormalities noted Psych:  Normal affect   EKG:  The EKG was personally reviewed and demonstrates:  ST, 124bpm, anteroseptal q waves, nonspecific T wave changes lateral leads Telemetry:  Telemetry was personally reviewed and demonstrates:  N/A  Relevant CV Studies:  Echo 2018 Study Conclusions   - Left ventricle: The cavity size was normal. Systolic function was  normal. The estimated ejection fraction was in the range of 55%  to 60% based on apical images. The study is not technically   sufficient to allow evaluation of LV diastolic function.  - Right ventricle: Systolic function was normal.  - Pulmonary arteries: Systolic pressure could not be accurately  estimated.    Cardiac cath 07/2016   There is moderate to severe left ventricular systolic dysfunction.  The left ventricular ejection fraction is 25-35% by visual estimate.  There is trivial (1+) mitral regurgitation.  There is no aortic valve stenosis.  Mid LAD lesion, 20 %stenosed.  Ost 2nd Diag to 2nd Diag lesion, 90 %stenosed.  Dist LAD lesion, 40 %stenosed.  Ost 1st Diag lesion, 90 %stenosed.  1st Diag lesion, 80 %stenosed.  1st Mrg lesion, 90 %stenosed.  1st RPLB lesion, 90 %stenosed.   1. No evidence of obstructive disease affecting the main arteries. Patent mid LAD stent with mild in-stent restenosis and moderate LAD disease distally. The patient does have diffuse small vessel branch disease affecting 2 diagonals, OM1 and PL 1. The disease is distal. 2. Moderately to severely reduced LV systolic function with an EF of 25-30% with global hypokinesis. 3. Moderately elevated filling pressures.  Recommendations: Compared to his most recent cardiac catheterization in 2016, the ejection fraction has decreased significantly. The patient seems to have mixed ischemic/ nonischemic  cardiomyopathy. Continue medical therapy for coronary artery disease. Optimize heart failure treatment. I increased the dose of lisinopril. The patient is volume overloaded and I started oral furosemide. I'm going to avoid aggressive diuresis due to contrast exposure. Consider screening for sleep apnea if that has not been done in the past.  Diagnostic Dominance: Right     Laboratory Data:  High Sensitivity Troponin:  No results for input(s): TROPONINIHS in the last 720 hours.   Chemistry Recent Labs  Lab 11/06/20 1541 11/07/20 0847  NA 135  --   K 3.9  --   CL 102  --   CO2 21*  --   GLUCOSE 212*  --    BUN 23  --   CREATININE 1.27* 1.13  CALCIUM 8.6*  --   GFRNONAA >60 >60  ANIONGAP 12  --     Recent Labs  Lab 11/06/20 1541  PROT 7.0  ALBUMIN 3.0*  AST 74*  ALT 99*  ALKPHOS 129*  BILITOT 0.8   Hematology Recent Labs  Lab 11/06/20 1541  WBC 24.4*  RBC 4.54  HGB 13.5  HCT 42.3  MCV 93.2  MCH 29.7  MCHC 31.9  RDW 12.7  PLT 407*   BNPNo results for input(s): BNP, PROBNP in the last 168 hours.  DDimer No results for input(s): DDIMER in the last 168 hours.   Radiology/Studies:  US Venous Img Lower Unilateral Left  Result Date: 11/06/2020 CLINICAL DATA:  Necrotic gangrenous toe.  Increased pain.  Erythema. EXAM: LEFT LOWER EXTREMITY VENOUS DOPPLER ULTRASOUND TECHNIQUE: Gray-scale sonography with compression, as well as color and duplex ultrasound, were performed to evaluate the deep venous system(s) from the level of the common femoral vein through the popliteal and proximal calf veins. COMPARISON:  None. FINDINGS: VENOUS Normal compressibility of the common femoral, superficial femoral, and popliteal veins, as well as the visualized calf veins. Visualized portions of profunda femoral vein and great saphenous vein unremarkable. No filling defects to suggest DVT on grayscale or color Doppler imaging. Doppler waveforms show normal direction of venous flow, normal respiratory plasticity and response to augmentation. Limited views of the contralateral common femoral vein are unremarkable. OTHER None. Limitations: none IMPRESSION: Negative. Electronically Signed   By: Dorise Bullion III M.D   On: 11/06/2020 17:45   DG Chest Portable 1 View  Result Date: 11/06/2020 CLINICAL DATA:  Mild hypoxia.  Left foot necrosis. EXAM: PORTABLE CHEST 1 VIEW COMPARISON:  July 29, 2019 FINDINGS: Linear opacity in the right mid lung is unchanged, consistent with scarring. Minimal atelectasis in the left base. Emphysematous changes in the lungs. No nodules or masses. Cardiomediastinal silhouette is  stable. No pneumothorax. IMPRESSION: No active disease. Electronically Signed   By: Dorise Bullion III M.D   On: 11/06/2020 17:44   DG Foot Complete Left  Result Date: 11/06/2020 CLINICAL DATA:  Left foot necrosis. EXAM: LEFT FOOT - COMPLETE 3+ VIEW COMPARISON:  None. FINDINGS: There is no evidence of acute fracture or dislocation. A moderate-sized plantar calcaneal spur is noted. Moderate severity diffuse soft tissue swelling is seen involving the left great toe. A moderate to marked amount of diffuse soft tissue air is also noted within this region. IMPRESSION: Diffuse soft tissue swelling and soft tissue air involving the left great toe, as described above, without evidence of acute fracture. Electronically Signed   By: Virgina Norfolk M.D.   On: 11/06/2020 17:50     Assessment and Plan:   CAD with remote stenting -Status post LAD  stenting with non-STEMI 07/2016.  Left heart cath showed moderate to severe diffuse small vessel disease and branch disease with patent LAD stent.  He was recommended for medical management. - Patient denies chest pain. He has chronic SOB that is unchanged. Patient is sedentary at baseline so difficult to determine exertional symptoms. - Continue Aspirin. Plavix held for amputation  HFpEF -Patient has history of mild left lower leg swelling, he says this is unchanged - He takes lasix 20 mg daily - Last echo from 2018 showed LVEF 60-65%. At the last visit echo ordered. Will complete while admitted.  - continue coreg, lisinopril  Hypertension - coreg 12.5mg  daily, Imdur 30mg  daily, lisinopril 2.5mg  daily - BP elevated. I will increase BB, also heart rate was fast, likely from sepsis, now improved.  Diabetes type 2 - A1C 8.4 09/2020 - per IM  Hyperlipidemia -Most recent LDL 86, goal less than 70 -Continue Zetia and atorvastatin 80 mg daily  Bilateral carotid disease - Study in 2019 showed <39% b/l carotid stenosis.  - Lateral ultrasounds ordered at the  last appointment. Nothing significant on exam -Aspirin, Plavix, Zetia  Pre-operative Cardiac eval PAD/PVD Left gangrenous Toe Sepsis - Surgery for left BTK amputation - He reported claudication symptoms for 2-3 years and has not seen vascular until recently - Patient presented with gangrenous toe with cellulitis on IV abx.  - Recommendation for left BKA amputation - From a cardiac perspective he appears to be stable. No chest pain, although is he is sedentary at baseline.  - Has chronic DOE, multifactorial given CAD, COPD, history of COVID-19 deconditioning - Last echo was in 2018 showing normal LV function. Has left sided lower leg edema reported as unchanged. Will  Update echo as above.  - METS<4. Unable to walk 1-2 blocks or perform light housework - According to cardiac risk assessment he is Class IV risk, 15% 30 day risk of death, MI, or cardiac arrest. Likely okay to proceed with surgery. MD to see.    For questions or updates, please contact [place] Please consult [email or web address] for contact info under    Signed, Homero Hyson Ninfa Meeker, PA-C  11/07/2020 3:12 PM

## 2020-11-07 NOTE — Progress Notes (Signed)
Patient ID: Danny Scrapeobert M Depaolis Sr., male   DOB: 06/11/1951, 70 y.o.   MRN: [id number] [facility] PROGRESS NOTE  Danny ScrapeRobert M Colgate Sr. EXB:[id number] DOB: 06/11/1951 DOA: 11/06/2020 PCP: Glori LuisSonnenberg, Eric G, MD  HPI/Subjective: Patient admitted yesterday with sepsis and gangrene of the first toe on the left foot and starting on the second toe and gangrene going plantar side of his foot.  Patient does have some pain in his foot.  Objective: Vitals:   11/07/20 0803 11/07/20 1200  BP: (!) 169/88 127/80  Pulse: 95 96  Resp: 18 (!) 26  Temp: 98 F (36.7 C) 98.8 F (37.1 C)  SpO2: 96% 98%    Intake/Output Summary (Last 24 hours) at 11/07/2020 1350 Last data filed at 11/07/2020 1300 Gross per 24 hour  Intake 1731.39 ml  Output 1800 ml  Net -68.61 ml   Filed Weights   11/06/20 1536  Weight: 100 kg    ROS: Review of Systems  Respiratory: Positive for shortness of breath. Negative for cough.   Cardiovascular: Negative for chest pain.  Gastrointestinal: Negative for abdominal pain, nausea and vomiting.  Musculoskeletal: Positive for joint pain.   Exam: Physical Exam HENT:     Head: Normocephalic.     Mouth/Throat:     Pharynx: No oropharyngeal exudate.  Eyes:     General: Lids are normal.     Conjunctiva/sclera: Conjunctivae normal.     Pupils: Pupils are equal, round, and reactive to light.  Cardiovascular:     Rate and Rhythm: Normal rate and regular rhythm.     Heart sounds: Normal heart sounds, S1 normal and S2 normal.  Pulmonary:     Breath sounds: Examination of the right-lower field reveals decreased breath sounds. Examination of the left-lower field reveals decreased breath sounds. Decreased breath sounds present. No wheezing, rhonchi or rales.  Abdominal:     Palpations: Abdomen is soft.     Tenderness: There is no abdominal tenderness.  Musculoskeletal:     Right ankle: No swelling.     Left ankle: No swelling.  Skin:    Comments: Gangrene left first toe and down the  plantar surface of his foot and starting of the second toe on the left foot  Neurological:     Mental Status: He is alert and oriented to person, place, and time.       Data Reviewed: Basic Metabolic Panel: Recent Labs  Lab 11/06/20 1541 11/07/20 0847  NA 135  --   K 3.9  --   CL 102  --   CO2 21*  --   GLUCOSE 212*  --   BUN 23  --   CREATININE 1.27* 1.13  CALCIUM 8.6*  --    Liver Function Tests: Recent Labs  Lab 11/06/20 1541  AST 74*  ALT 99*  ALKPHOS 129*  BILITOT 0.8  PROT 7.0  ALBUMIN 3.0*   CBC: Recent Labs  Lab 11/06/20 1541  WBC 24.4*  NEUTROABS 20.6*  HGB 13.5  HCT 42.3  MCV 93.2  PLT 407*   BNP (last 3 results) Recent Labs    10/26/20 1048  BNP 94.0    CBG: Recent Labs  Lab 10/31/20 1434 11/07/20 0857 11/07/20 1129  GLUCAP 111* 183* 231*    Recent Results (from the past 240 hour(s))  Culture, blood (routine x 2)     Status: None (Preliminary result)   Collection Time: 11/06/20  3:14 PM   Specimen: BLOOD  Result Value Ref Range Status  Specimen Description BLOOD  RT [facility]  Final   Special Requests   Final    BOTTLES DRAWN AEROBIC AND ANAEROBIC Blood Culture adequate volume   Culture   Final    NO GROWTH < 24 HOURS Performed at [facility], [address] [place], [place] [address]    Report Status PENDING  Incomplete  Culture, blood (routine x 2)     Status: None (Preliminary result)   Collection Time: 11/06/20  3:41 PM   Specimen: BLOOD  Result Value Ref Range Status   Specimen Description BLOOD  RT [facility]  Final   Special Requests   Final    BOTTLES DRAWN AEROBIC AND ANAEROBIC Blood Culture adequate volume   Culture   Final    NO GROWTH < 24 HOURS Performed at [facility], [address], [place], [place] [address]    Report Status PENDING  Incomplete  SARS CORONAVIRUS 2 (TAT 6-24 HRS) Nasopharyngeal Nasopharyngeal Swab     Status: None   Collection Time: 11/06/20 11:35 PM   Specimen:  Nasopharyngeal Swab  Result Value Ref Range Status   SARS Coronavirus 2 NEGATIVE NEGATIVE Final    Comment: (NOTE) SARS-CoV-2 target nucleic acids are NOT DETECTED.  The SARS-CoV-2 RNA is generally detectable in upper and lower respiratory specimens during the acute phase of infection. Negative results do not preclude SARS-CoV-2 infection, do not rule out co-infections with other pathogens, and should not be used as the sole basis for treatment or other patient management decisions. Negative results must be combined with clinical observations, patient history, and epidemiological information. The expected result is Negative.  Fact Sheet for Patients: HairSlick.no  Fact Sheet for Healthcare Providers: quierodirigir.com  This test is not yet approved or cleared by the [place] FDA and  has been authorized for detection and/or diagnosis of SARS-CoV-2 by FDA under an Emergency Use Authorization (EUA). This EUA will remain  in effect (meaning this test can be used) for the duration of the COVID-19 declaration under Se ction 564(b)(1) of the Act, 21 U.S.C. section 360bbb-3(b)(1), unless the authorization is terminated or revoked sooner.  Performed at [facility], [address], [place], [place] [address]      Studies: US Venous Img Lower Unilateral Left  Result Date: 11/06/2020 CLINICAL DATA:  Necrotic gangrenous toe.  Increased pain.  Erythema. EXAM: LEFT LOWER EXTREMITY VENOUS DOPPLER ULTRASOUND TECHNIQUE: Gray-scale sonography with compression, as well as color and duplex ultrasound, were performed to evaluate the deep venous system(s) from the level of the common femoral vein through the popliteal and proximal calf veins. COMPARISON:  None. FINDINGS: VENOUS Normal compressibility of the common femoral, superficial femoral, and popliteal veins, as well as the visualized calf veins. Visualized portions of profunda  femoral vein and great saphenous vein unremarkable. No filling defects to suggest DVT on grayscale or color Doppler imaging. Doppler waveforms show normal direction of venous flow, normal respiratory plasticity and response to augmentation. Limited views of the contralateral common femoral vein are unremarkable. OTHER None. Limitations: none IMPRESSION: Negative. Electronically Signed   By: Gerome Sam III M.D   On: 11/06/2020 17:45   DG Chest Portable 1 View  Result Date: 11/06/2020 CLINICAL DATA:  Mild hypoxia.  Left foot necrosis. EXAM: PORTABLE CHEST 1 VIEW COMPARISON:  July 29, 2019 FINDINGS: Linear opacity in the right mid lung is unchanged, consistent with scarring. Minimal atelectasis in the left base. Emphysematous changes in the lungs. No nodules or masses. Cardiomediastinal silhouette  is stable. No pneumothorax. IMPRESSION: No active disease. Electronically Signed   By: Gerome Sam III M.D   On: 11/06/2020 17:44   DG Foot Complete Left  Result Date: 11/06/2020 CLINICAL DATA:  Left foot necrosis. EXAM: LEFT FOOT - COMPLETE 3+ VIEW COMPARISON:  None. FINDINGS: There is no evidence of acute fracture or dislocation. A moderate-sized plantar calcaneal spur is noted. Moderate severity diffuse soft tissue swelling is seen involving the left great toe. A moderate to marked amount of diffuse soft tissue air is also noted within this region. IMPRESSION: Diffuse soft tissue swelling and soft tissue air involving the left great toe, as described above, without evidence of acute fracture. Electronically Signed   By: Aram Candela M.D.   On: 11/06/2020 17:50    Scheduled Meds: . aspirin EC  81 mg Oral Daily  . atorvastatin  80 mg Oral QHS  . carvedilol  12.5 mg Oral BID WC  . escitalopram  10 mg Oral Daily  . ezetimibe  10 mg Oral Daily  . insulin aspart  0-5 Units Subcutaneous QHS  . insulin aspart  0-9 Units Subcutaneous TID WC  . insulin glargine  20 Units Subcutaneous Daily  .  ipratropium-albuterol  3 mL Nebulization Q6H  . isosorbide mononitrate  30 mg Oral Daily  . lisinopril  2.5 mg Oral Daily  . nystatin  5 mL Oral QID  . potassium chloride  10 mEq Oral Daily  . tamsulosin  0.4 mg Oral Daily   Continuous Infusions: . ceFEPime (MAXIPIME) IV 2 g (11/07/20 0839)  . metronidazole 500 mg (11/07/20 0951)  . vancomycin      Assessment/Plan:  1. Clinical sepsis, present on admission with gangrene of the left first toe, starting of the second toe and going down the plantar surface.  Patient will be set up for a BKA on Thursday by vascular surgery.  Continue aspirin for now.  Hold Plavix for amputation. 2. Peripheral vascular disease on aspirin.  Recent angiogram with 3 angioplasties and 1 stent.  Hold Plavix for amputation. 3. Type 2 diabetes mellitus with hyperlipidemia.  Continue glargine insulin on a daily basis and sliding scale. 4. History of CAD on chronic CHF.  Discontinue IV fluids and give 1 dose of Lasix.  Still short of breath we will get a chest x-ray.  We will have his cardiology team see preoperatively. 5. Essential hypertension.  Continue lisinopril, Coreg and Imdur 6. COPD.  Will give nebulizer treatments standing dose. 7. Chronic kidney disease stage II 8. Thrush on the tongue.  Nystatin swish and swallow. 9. BPH without symptoms on Flomax 10. Elevated liver function test likely secondary to sepsis        Code Status:     Code Status Orders  (From admission, onward)         Start     Ordered   11/06/20 1759  Full code  Continuous        11/06/20 1759        Code Status History    Date Active Date Inactive Code Status Order ID Comments User Context   07/29/2019 1757 08/02/2019 1544 Full Code [id number]  Rolly Salter, MD ED   07/21/2016 0233 07/23/2016 2007 Full Code [id number]  Oralia Manis, MD Inpatient   Advance Care Planning Activity     Family Communication: The patient's daughter on the phone called when I was in the  room and I gave her an update Disposition Plan: Status is: Inpatient  Dispo: The patient is from: Home              Anticipated d/c is to: Home versus rehab depending on how he is doing post operatively              Patient currently will have amputation on Thursday, 11/10/2020   Difficult to place patient.  No.  Consultants:  Vascular surgery  Podiatry  Cardiology  Antibiotics:  Vancomycin  Cefepime  Flagyl  Time spent: 28 minutes  Shadi [employer]

## 2020-11-07 NOTE — Progress Notes (Signed)
   11/07/20 1200  Assess: MEWS Score  Temp 98.8 F (37.1 C)  BP 127/80  Pulse Rate 96  Resp (!) 26  SpO2 98 %  Assess: MEWS Score  MEWS Temp 0  MEWS Systolic 0  MEWS Pulse 0  MEWS RR 2  MEWS LOC 0  MEWS Score 2  MEWS Score Color Yellow  Take Vital Signs  Increase Vital Sign Frequency  Yellow: Q 2hr X 2 then Q 4hr X 2, if remains yellow, continue Q 4hrs  Escalate  MEWS: Escalate Yellow: discuss with charge nurse/RN and consider discussing with provider and RRT  Notify: Charge Nurse/RN  Name of Charge Nurse/RN Notified Morrie Sheldon  Date Charge Nurse/RN Notified 11/07/20  Time Charge Nurse/RN Notified 1228  Notify: Provider  Provider Name/Title Wieting  Date Provider Notified 11/07/20  Time Provider Notified 1228  Notification Type Page  Notification Reason Other (Comment) (Respiratory rate)

## 2020-11-08 ENCOUNTER — Inpatient Hospital Stay (HOSPITAL_COMMUNITY)
Admit: 2020-11-08 | Discharge: 2020-11-08 | Disposition: A | Discharge status: Home / Self Care | Payer: 59 | Attending: Medical | Admitting: Medical

## 2020-11-08 DIAGNOSIS — R6520 Severe sepsis without septic shock: Secondary | ICD-10-CM | POA: Diagnosis not present

## 2020-11-08 DIAGNOSIS — I5032 Chronic diastolic (congestive) heart failure: Secondary | ICD-10-CM

## 2020-11-08 DIAGNOSIS — R0602 Shortness of breath: Secondary | ICD-10-CM | POA: Diagnosis not present

## 2020-11-08 DIAGNOSIS — I96 Gangrene, not elsewhere classified: Secondary | ICD-10-CM | POA: Diagnosis not present

## 2020-11-08 DIAGNOSIS — E1169 Type 2 diabetes mellitus with other specified complication: Secondary | ICD-10-CM | POA: Diagnosis not present

## 2020-11-08 DIAGNOSIS — I739 Peripheral vascular disease, unspecified: Secondary | ICD-10-CM | POA: Diagnosis not present

## 2020-11-08 DIAGNOSIS — A419 Sepsis, unspecified organism: Secondary | ICD-10-CM | POA: Diagnosis not present

## 2020-11-08 LAB — BASIC METABOLIC PANEL
Anion gap: 8 (ref 5–15)
BUN: 23 mg/dL (ref 8–23)
CO2: 21 mmol/L — ABNORMAL LOW (ref 22–32)
Calcium: 8.3 mg/dL — ABNORMAL LOW (ref 8.9–10.3)
Chloride: 106 mmol/L (ref 98–111)
Creatinine, Ser: 1.11 mg/dL (ref 0.61–1.24)
GFR, Estimated: >60 mL/min (ref >60)
Glucose, Bld: 103 mg/dL — ABNORMAL HIGH (ref 70–99)
Potassium: 3.7 mmol/L (ref 3.5–5.1)
Sodium: 135 mmol/L (ref 135–145)

## 2020-11-08 LAB — CBC
HCT: 37.8 % — ABNORMAL LOW (ref 39.0–52.0)
Hemoglobin: 12.3 g/dL — ABNORMAL LOW (ref 13.0–17.0)
MCH: 30.2 pg (ref 26.0–34.0)
MCHC: 32.5 g/dL (ref 30.0–36.0)
MCV: 92.9 fL (ref 80.0–100.0)
Platelets: 378 10*3/uL (ref 150–400)
RBC: 4.07 MIL/uL — ABNORMAL LOW (ref 4.22–5.81)
RDW: 12.7 % (ref 11.5–15.5)
WBC: 21.6 10*3/uL — ABNORMAL HIGH (ref 4.0–10.5)
nRBC: 0 % (ref 0.0–0.2)

## 2020-11-08 LAB — GLUCOSE, CAPILLARY
Glucose-Capillary: 95 mg/dL (ref 70–99)
Glucose-Capillary: 133 mg/dL — ABNORMAL HIGH (ref 70–99)
Glucose-Capillary: 175 mg/dL — ABNORMAL HIGH (ref 70–99)
Glucose-Capillary: 157 mg/dL — ABNORMAL HIGH (ref 70–99)

## 2020-11-08 MED ORDER — INSULIN ASPART 100 UNIT/ML ~~LOC~~ SOLN
0.0000 [IU] | Freq: Every day | SUBCUTANEOUS | Status: DC
  Administered 2020-11-15: 21:00:00 3 [IU] via SUBCUTANEOUS
  Filled 2020-11-08: qty 1

## 2020-11-08 MED ORDER — IPRATROPIUM-ALBUTEROL 0.5-2.5 (3) MG/3ML IN SOLN
3.0000 mL | Freq: Three times a day (TID) | RESPIRATORY_TRACT | Status: DC
  Administered 2020-11-08 (×2): 3 mL via RESPIRATORY_TRACT
  Filled 2020-11-08 (×4): qty 3

## 2020-11-08 MED ORDER — INSULIN ASPART 100 UNIT/ML ~~LOC~~ SOLN
0.0000 [IU] | Freq: Three times a day (TID) | SUBCUTANEOUS | Status: DC
  Administered 2020-11-08: 12:00:00 2 [IU] via SUBCUTANEOUS
  Administered 2020-11-08: 17:00:00 3 [IU] via SUBCUTANEOUS
  Administered 2020-11-09: 2 [IU] via SUBCUTANEOUS
  Administered 2020-11-09: 12:00:00 3 [IU] via SUBCUTANEOUS
  Administered 2020-11-11: 2 [IU] via SUBCUTANEOUS
  Administered 2020-11-11 – 2020-11-12 (×3): 3 [IU] via SUBCUTANEOUS
  Administered 2020-11-12 – 2020-11-13 (×3): 5 [IU] via SUBCUTANEOUS
  Administered 2020-11-13 – 2020-11-14 (×2): 8 [IU] via SUBCUTANEOUS
  Administered 2020-11-14 – 2020-11-15 (×3): 3 [IU] via SUBCUTANEOUS
  Administered 2020-11-15: 2 [IU] via SUBCUTANEOUS
  Administered 2020-11-15 – 2020-11-16 (×2): 5 [IU] via SUBCUTANEOUS
  Filled 2020-11-08 (×19): qty 1

## 2020-11-08 MED ORDER — ENOXAPARIN SODIUM 60 MG/0.6ML ~~LOC~~ SOLN
0.5000 mg/kg | SUBCUTANEOUS | Status: DC
  Administered 2020-11-08: 50 mg via SUBCUTANEOUS
  Filled 2020-11-08: qty 0.6

## 2020-11-08 MED ORDER — INSULIN GLARGINE 100 UNIT/ML ~~LOC~~ SOLN
28.0000 [IU] | Freq: Every day | SUBCUTANEOUS | Status: DC
  Administered 2020-11-08 – 2020-11-10 (×3): 28 [IU] via SUBCUTANEOUS
  Filled 2020-11-08 (×5): qty 0.28

## 2020-11-08 MED ORDER — PERFLUTREN LIPID MICROSPHERE
1.0000 mL | INTRAVENOUS | Status: AC | PRN
  Administered 2020-11-08: 2 mL via INTRAVENOUS
  Filled 2020-11-08: qty 10

## 2020-11-08 MED ORDER — FUROSEMIDE 20 MG PO TABS: 20.0000 mg | ORAL_TABLET | ORAL | Status: DC

## 2020-11-08 NOTE — Progress Notes (Signed)
Patient ID: Danny Scrape Sr., male   DOB: 05-Apr-1951, 70 y.o.   MRN: [id number] [facility] PROGRESS NOTE  Danny [name]:[id number] DOB: [birth year] DOA: 11/06/2020 PCP: Danny Luis, MD  HPI/Subjective: Patient admitted with gangrene of his foot.  Vascular surgery scheduled for BKA on Thursday.  Patient does have some pain in his foot but better controlled with pain medication.  No shortness of breath or chest pain.  Objective: Vitals:   11/08/20 0741 11/08/20 1127  BP: (!) 159/109 (!) 160/101  Pulse: 88 86  Resp: 18 18  Temp: 98.1 F (36.7 C) 98 F (36.7 C)  SpO2: 96% 98%    Intake/Output Summary (Last 24 hours) at 11/08/2020 1512 Last data filed at 11/08/2020 1344 Gross per 24 hour  Intake 1088.65 ml  Output 450 ml  Net 638.65 ml   Filed Weights   11/06/20 1536  Weight: 100 kg    ROS: Review of Systems  Respiratory: Negative for cough and shortness of breath.   Cardiovascular: Negative for chest pain.  Gastrointestinal: Negative for abdominal pain, nausea and vomiting.  Musculoskeletal: Positive for joint pain.   Exam: Physical Exam HENT:     Head: Normocephalic.     Mouth/Throat:     Pharynx: No oropharyngeal exudate.  Eyes:     General: Lids are normal.     Conjunctiva/sclera: Conjunctivae normal.     Pupils: Pupils are equal, round, and reactive to light.  Cardiovascular:     Rate and Rhythm: Normal rate and regular rhythm.     Heart sounds: Normal heart sounds, S1 normal and S2 normal.  Pulmonary:     Breath sounds: No decreased breath sounds, wheezing, rhonchi or rales.  Abdominal:     Palpations: Abdomen is soft.     Tenderness: There is no abdominal tenderness.  Musculoskeletal:     Right ankle: No swelling.     Left ankle: No swelling.  Skin:    General: Skin is warm.     Comments: Gangrene starting to progress to the third toe on the left foot.  Gangrene on the first toe and also progressed to the second toe.  Gangrene going down  the plantar surface of his left foot  Neurological:     Mental Status: He is alert and oriented to person, place, and time.       Data Reviewed: Basic Metabolic Panel: Recent Labs  Lab 11/06/20 1541 11/07/20 0847 11/08/20 0435  NA 135  --  135  K 3.9  --  3.7  CL 102  --  106  CO2 21*  --  21*  GLUCOSE 212*  --  103*  BUN 23  --  23  CREATININE 1.27* 1.13 1.11  CALCIUM 8.6*  --  8.3*   Liver Function Tests: Recent Labs  Lab 11/06/20 1541  AST 74*  ALT 99*  ALKPHOS 129*  BILITOT 0.8  PROT 7.0  ALBUMIN 3.0*   CBC: Recent Labs  Lab 11/06/20 1541 11/08/20 0435  WBC 24.4* 21.6*  NEUTROABS 20.6*  --   HGB 13.5 12.3*  HCT 42.3 37.8*  MCV 93.2 92.9  PLT 407* 378   BNP (last 3 results) Recent Labs    10/26/20 1048  BNP 94.0    CBG: Recent Labs  Lab 11/07/20 1129 11/07/20 1555 11/07/20 2134 11/08/20 0740 11/08/20 1126  GLUCAP 231* 192* 139* 95 133*    Recent Results (from the past 240 hour(s))  Culture, blood (routine x 2)  Status: None (Preliminary result)   Collection Time: 11/06/20  3:14 PM   Specimen: BLOOD  Result Value Ref Range Status   Specimen Description BLOOD  RT [facility]  Final   Special Requests   Final    BOTTLES DRAWN AEROBIC AND ANAEROBIC Blood Culture adequate volume   Culture   Final    NO GROWTH 2 DAYS Performed at [facility], [address] [place], [place] [address]    Report Status PENDING  Incomplete  Culture, blood (routine x 2)     Status: None (Preliminary result)   Collection Time: 11/06/20  3:41 PM   Specimen: BLOOD  Result Value Ref Range Status   Specimen Description BLOOD  RT [facility]  Final   Special Requests   Final    BOTTLES DRAWN AEROBIC AND ANAEROBIC Blood Culture adequate volume   Culture   Final    NO GROWTH 2 DAYS Performed at [facility], [address], [place], [place] [address]    Report Status PENDING  Incomplete  SARS CORONAVIRUS 2 (TAT 6-24 HRS) Nasopharyngeal  Nasopharyngeal Swab     Status: None   Collection Time: 11/06/20 11:35 PM   Specimen: Nasopharyngeal Swab  Result Value Ref Range Status   SARS Coronavirus 2 NEGATIVE NEGATIVE Final    Comment: (NOTE) SARS-CoV-2 target nucleic acids are NOT DETECTED.  The SARS-CoV-2 RNA is generally detectable in upper and lower respiratory specimens during the acute phase of infection. Negative results do not preclude SARS-CoV-2 infection, do not rule out co-infections with other pathogens, and should not be used as the sole basis for treatment or other patient management decisions. Negative results must be combined with clinical observations, patient history, and epidemiological information. The expected result is Negative.  Fact Sheet for Patients: HairSlick.no  Fact Sheet for Healthcare Providers: quierodirigir.com  This test is not yet approved or cleared by the [place] FDA and  has been authorized for detection and/or diagnosis of SARS-CoV-2 by FDA under an Emergency Use Authorization (EUA). This EUA will remain  in effect (meaning this test can be used) for the duration of the COVID-19 declaration under Se ction 564(b)(1) of the Act, 21 U.S.C. section 360bbb-3(b)(1), unless the authorization is terminated or revoked sooner.  Performed at [facility], [address], [place], [place] [address]      Studies: US Venous Img Lower Unilateral Left  Result Date: 11/06/2020 CLINICAL DATA:  Necrotic gangrenous toe.  Increased pain.  Erythema. EXAM: LEFT LOWER EXTREMITY VENOUS DOPPLER ULTRASOUND TECHNIQUE: Gray-scale sonography with compression, as well as color and duplex ultrasound, were performed to evaluate the deep venous system(s) from the level of the common femoral vein through the popliteal and proximal calf veins. COMPARISON:  None. FINDINGS: VENOUS Normal compressibility of the common femoral, superficial femoral, and  popliteal veins, as well as the visualized calf veins. Visualized portions of profunda femoral vein and great saphenous vein unremarkable. No filling defects to suggest DVT on grayscale or color Doppler imaging. Doppler waveforms show normal direction of venous flow, normal respiratory plasticity and response to augmentation. Limited views of the contralateral common femoral vein are unremarkable. OTHER None. Limitations: none IMPRESSION: Negative. Electronically Signed   By: Gerome Sam III M.D   On: 11/06/2020 17:45   DG Chest Port 1 View  Result Date: 11/07/2020 CLINICAL DATA:  Short of breath EXAM: PORTABLE CHEST 1 VIEW COMPARISON:  11/06/2020 FINDINGS: Streaky densities in the right lung base compatible with scarring unchanged from  prior studies. Chronic elevation right hemidiaphragm unchanged Left lung is clear. Negative for heart failure or pneumonia. No pleural effusion. Apical emphysema. IMPRESSION: No acute abnormality. Scarring in the right lung base. Apical emphysema. Electronically Signed   By: Marlan Palauharles  Clark M.D.   On: 11/07/2020 17:01   DG Chest Portable 1 View  Result Date: 11/06/2020 CLINICAL DATA:  Mild hypoxia.  Left foot necrosis. EXAM: PORTABLE CHEST 1 VIEW COMPARISON:  July 29, 2019 FINDINGS: Linear opacity in the right mid lung is unchanged, consistent with scarring. Minimal atelectasis in the left base. Emphysematous changes in the lungs. No nodules or masses. Cardiomediastinal silhouette is stable. No pneumothorax. IMPRESSION: No active disease. Electronically Signed   By: Gerome Samavid  Williams III M.D   On: 11/06/2020 17:44   DG Foot Complete Left  Result Date: 11/06/2020 CLINICAL DATA:  Left foot necrosis. EXAM: LEFT FOOT - COMPLETE 3+ VIEW COMPARISON:  None. FINDINGS: There is no evidence of acute fracture or dislocation. A moderate-sized plantar calcaneal spur is noted. Moderate severity diffuse soft tissue swelling is seen involving the left great toe. A moderate to marked  amount of diffuse soft tissue air is also noted within this region. IMPRESSION: Diffuse soft tissue swelling and soft tissue air involving the left great toe, as described above, without evidence of acute fracture. Electronically Signed   By: Aram Candelahaddeus  Houston M.D.   On: 11/06/2020 17:50    Scheduled Meds: . aspirin EC  81 mg Oral Daily  . atorvastatin  80 mg Oral QHS  . carvedilol  25 mg Oral BID WC  . enoxaparin (LOVENOX) injection  40 mg Subcutaneous Q24H  . escitalopram  10 mg Oral Daily  . ezetimibe  10 mg Oral Daily  . insulin aspart  0-15 Units Subcutaneous TID WC  . insulin aspart  0-5 Units Subcutaneous QHS  . insulin glargine  28 Units Subcutaneous Daily  . ipratropium-albuterol  3 mL Nebulization TID  . isosorbide mononitrate  30 mg Oral Daily  . lisinopril  2.5 mg Oral Daily  . potassium chloride  10 mEq Oral Daily  . tamsulosin  0.4 mg Oral Daily   Continuous Infusions: . ceFEPime (MAXIPIME) IV 2 g (11/08/20 1457)  . metronidazole Stopped (11/08/20 1256)  . vancomycin 1,250 mg (11/07/20 1810)   Brief history.  Patient admitted 11/06/2020 with clinical sepsis and gangrene of the left first toe and starting of the second toe and also gangrene going down the plantar surface of the foot.  Patient had leukocytosis and tachycardia.  Patient has been following with podiatry as outpatient and also had a recent angiogram by vascular surgery where they did 3 angioplasties and one stent.  Patient seen in consultation by both podiatry and vascular surgery and he will be set up for a BKA on Thursday.  Past medical history type 2 diabetes mellitus, hyperlipidemia, CAD, chronic diastolic CHF, COPD, essential hypertension and chronic kidney disease stage II  Assessment/Plan:  1. Clinical sepsis, present on admission with gangrene of the left first toe starting of the second and third toe and going down the plantar surface of his foot.  Vascular surgery will do a BKA on Thursday.  Continue  aspirin for now.  Holding Plavix for amputation.  Lovenox injections for DVT prophylaxis. 2. Peripheral vascular disease on aspirin.  Recent angiogram with 3 angioplasties and one stent.  Holding Plavix for amputation. 3. Type 2 diabetes mellitus with hyperlipidemia.  On glargine insulin dose was increased to 28 units.  And short  acting insulin prior to meals. 4. Essential hypertension on lisinopril Coreg and Imdur 5. History of CAD and chronic diastolic CHF.  Signs of fluid overload at this time on Coreg, aspirin, atorvastatin and lisinopril.  Lasix every other day 6. Chronic kidney disease stage II 7. COPD on nebulizer treatments 8. Thrush on the tongue has improved with nystatin swish and swallow patient did not want to continue so this was discontinued. 9. BPH without symptoms on Flomax 10. Elevated liver function test secondary to sepsis        Code Status:     Code Status Orders  (From admission, onward)         Start     Ordered   11/06/20 1759  Full code  Continuous        11/06/20 1759        Code Status History    Date Active Date Inactive Code Status Order ID Comments User Context   07/29/2019 1757 08/02/2019 1544 Full Code [id number]  Rolly Salter, MD ED   07/21/2016 0233 07/23/2016 2007 Full Code [id number]  Oralia Manis, MD Inpatient   Advance Care Planning Activity     Family Communication: Updated son on the phone Disposition Plan: Status is: Inpatient  Dispo: The patient is from: Home              Anticipated d/c is to: Home versus rehab depending on how he is doing postoperatively              Patient currently awaiting BKA on Thursday.   Difficult to place patient.  Hopefully not.  Consultants:  Vascular surgery  Antibiotics:  Vancomycin  Cefepime  Flagyl  Time spent: 27 minutes  [employer]

## 2020-11-08 NOTE — Progress Notes (Signed)
PHARMACIST - PHYSICIAN COMMUNICATION  CONCERNING:  Enoxaparin (Lovenox) for DVT Prophylaxis    RECOMMENDATION: Patient was prescribed enoxaparin 40mg  q24 hours for VTE prophylaxis.   Filed Weights   11/06/20 1536  Weight: 100 kg (220 lb 7.4 oz)    Body mass index is 33.52 kg/m.  Estimated Creatinine Clearance: 72 mL/min (by C-G formula based on SCr of 1.11 mg/dL).   Based on [facility] policy patient is candidate for enoxaparin 0.5mg /kg TBW SQ every 24 hours based on BMI being >30.  DESCRIPTION: Pharmacy has adjusted enoxaparin dose per [facility] policy.  Patient is now receiving enoxaparin 50 mg every 24 hours    [facility] 11/08/2020 4:19 PM

## 2020-11-08 NOTE — Progress Notes (Signed)
[facility]  Daily Progress Note  Subjective: Patient with stable left lower extremity discomfort.  This is tolerable with his current pain regimen. No acute issues overnight.  Objective: Vitals:   11/08/20 0428 11/08/20 0719 11/08/20 0741 11/08/20 1127  BP: (!) 159/98  (!) 159/109 (!) 160/101  Pulse: 88  88 86  Resp: 18  18 18   Temp: 98.7 F (37.1 C)  98.1 F (36.7 C) 98 F (36.7 C)  TempSrc:      SpO2: 92% 95% 96% 98%  Weight:      Height:        Intake/Output Summary (Last 24 hours) at 11/08/2020 1229 Last data filed at 11/08/2020 1005 Gross per 24 hour  Intake 963.34 ml  Output 750 ml  Net 213.34 ml   Physical Exam: A&Ox3, NAD CV: RRR Pulmonary: CTA Bilaterally Abdomen: Soft, Nontender, Nondistended Vascular:  Left lower extremity: Thigh soft. Calf soft. Extremity is warm.  Erythema is noted to the left foot.  Foul odor noted.  Gangrenous changes to the first and second toes. Gangrenous changes tracked back towards the heel.    Laboratory: CBC    Component Value Date/Time   WBC 21.6 (H) 11/08/2020 0435   HGB 12.3 (L) 11/08/2020 0435   HGB 15.1 11/23/2014 2006   HCT 37.8 (L) 11/08/2020 0435   HCT 46.1 11/23/2014 2006   PLT 378 11/08/2020 0435   PLT 229 11/23/2014 2006   BMET    Component Value Date/Time   NA 135 11/08/2020 0435   NA 135 11/23/2014 2006   K 3.7 11/08/2020 0435   K 4.0 11/23/2014 2006   CL 106 11/08/2020 0435   CL 100 (L) 11/23/2014 2006   CO2 21 (L) 11/08/2020 0435   CO2 24 11/23/2014 2006   GLUCOSE 103 (H) 11/08/2020 0435   GLUCOSE 370 (H) 11/23/2014 2006   BUN 23 11/08/2020 0435   BUN 40 (H) 11/23/2014 2006   CREATININE 1.11 11/08/2020 0435   CREATININE 2.16 (H) 11/23/2014 2006   CALCIUM 8.3 (L) 11/08/2020 0435   CALCIUM 8.6 (L) 11/23/2014 2006   GFRNONAA >60 11/08/2020 0435   GFRNONAA 31 (L) 11/23/2014 2006   GFRAA 37 (L) 12/24/2019 1608   GFRAA 36 (L) 11/23/2014 2006   Assessment/Planning: The patient  is a 70 year old male with known peripheral artery disease status post recent endovascular intervention who presents to the hospital with progressively worsening gangrene and discomfort to the left foot  1) physical exam is stable 2) slight improvement in white blood cell count 3) afebrile with stable vital signs 4) patient is on the operating room schedule for a left below the knee amputation with Dr. 78 on Thursday.   Discussed with Dr. Monday Dirck Butch PA-C 11/08/2020 12:29 PM

## 2020-11-09 ENCOUNTER — Other Ambulatory Visit (INDEPENDENT_AMBULATORY_CARE_PROVIDER_SITE_OTHER): Payer: Self-pay | Admitting: Vascular Surgery

## 2020-11-09 DIAGNOSIS — A419 Sepsis, unspecified organism: Secondary | ICD-10-CM | POA: Diagnosis not present

## 2020-11-09 DIAGNOSIS — L03116 Cellulitis of left lower limb: Secondary | ICD-10-CM

## 2020-11-09 DIAGNOSIS — I96 Gangrene, not elsewhere classified: Secondary | ICD-10-CM | POA: Diagnosis not present

## 2020-11-09 DIAGNOSIS — I5020 Unspecified systolic (congestive) heart failure: Secondary | ICD-10-CM

## 2020-11-09 DIAGNOSIS — I2510 Atherosclerotic heart disease of native coronary artery without angina pectoris: Secondary | ICD-10-CM

## 2020-11-09 HISTORY — DX: Cellulitis of left lower limb: L03.116

## 2020-11-09 LAB — ECHOCARDIOGRAM COMPLETE
Area-P 1/2: 4.15 cm2
Calc EF: 36.6 %
Height: 68 in
S' Lateral: 4.23 cm
Single Plane A2C EF: 40.3 %
Single Plane A4C EF: 37.6 %
Weight: 3527.36 oz

## 2020-11-09 LAB — COMPREHENSIVE METABOLIC PANEL
ALT: 60 U/L — ABNORMAL HIGH (ref 0–44)
AST: 46 U/L — ABNORMAL HIGH (ref 15–41)
Albumin: 2.4 g/dL — ABNORMAL LOW (ref 3.5–5.0)
Alkaline Phosphatase: 120 U/L (ref 38–126)
Anion gap: 7 (ref 5–15)
BUN: 26 mg/dL — ABNORMAL HIGH (ref 8–23)
CO2: 22 mmol/L (ref 22–32)
Calcium: 8.5 mg/dL — ABNORMAL LOW (ref 8.9–10.3)
Chloride: 107 mmol/L (ref 98–111)
Creatinine, Ser: 1.13 mg/dL (ref 0.61–1.24)
GFR, Estimated: >60 mL/min (ref >60)
Glucose, Bld: 114 mg/dL — ABNORMAL HIGH (ref 70–99)
Potassium: 3.9 mmol/L (ref 3.5–5.1)
Sodium: 136 mmol/L (ref 135–145)
Total Bilirubin: 0.8 mg/dL (ref 0.3–1.2)
Total Protein: 6.5 g/dL (ref 6.5–8.1)

## 2020-11-09 LAB — GLUCOSE, CAPILLARY
Glucose-Capillary: 116 mg/dL — ABNORMAL HIGH (ref 70–99)
Glucose-Capillary: 196 mg/dL — ABNORMAL HIGH (ref 70–99)
Glucose-Capillary: 147 mg/dL — ABNORMAL HIGH (ref 70–99)
Glucose-Capillary: 200 mg/dL — ABNORMAL HIGH (ref 70–99)

## 2020-11-09 LAB — ABO/RH: ABO/RH(D): A POS

## 2020-11-09 MED ORDER — FUROSEMIDE 20 MG PO TABS
20.0000 mg | ORAL_TABLET | Freq: Every day | ORAL | Status: DC
  Administered 2020-11-09 – 2020-11-16 (×7): 20 mg via ORAL
  Filled 2020-11-09 (×7): qty 1

## 2020-11-09 MED ORDER — IPRATROPIUM-ALBUTEROL 0.5-2.5 (3) MG/3ML IN SOLN
3.0000 mL | Freq: Four times a day (QID) | RESPIRATORY_TRACT | Status: DC | PRN
  Administered 2020-11-09 – 2020-11-10 (×2): 3 mL via RESPIRATORY_TRACT
  Filled 2020-11-09: qty 3

## 2020-11-09 MED ORDER — SODIUM CHLORIDE 0.9 % IV SOLN
3.0000 g | Freq: Four times a day (QID) | INTRAVENOUS | Status: AC
  Administered 2020-11-09 – 2020-11-12 (×11): 3 g via INTRAVENOUS
  Filled 2020-11-09: qty 8
  Filled 2020-11-09: qty 3
  Filled 2020-11-09: qty 8
  Filled 2020-11-09: qty 3
  Filled 2020-11-09 (×4): qty 8
  Filled 2020-11-09: qty 3
  Filled 2020-11-09: qty 8
  Filled 2020-11-09: qty 3

## 2020-11-09 MED ORDER — SENNOSIDES-DOCUSATE SODIUM 8.6-50 MG PO TABS
2.0000 | ORAL_TABLET | Freq: Two times a day (BID) | ORAL | Status: DC
  Administered 2020-11-09 – 2020-11-14 (×11): 2 via ORAL
  Filled 2020-11-09 (×12): qty 2

## 2020-11-09 MED ORDER — LISINOPRIL 5 MG PO TABS
5.0000 mg | ORAL_TABLET | Freq: Every day | ORAL | Status: DC
  Administered 2020-11-09 – 2020-11-11 (×2): 5 mg via ORAL
  Filled 2020-11-09 (×2): qty 1

## 2020-11-09 MED ORDER — SODIUM CHLORIDE 0.9 % IV SOLN: INTRAVENOUS | Status: DC

## 2020-11-09 NOTE — Progress Notes (Signed)
[facility] Daily Progress Note  Subjective: Patient with stable left lower extremity discomfort.  This is tolerable with his current pain regimen. No acute issues overnight.  Objective: Vitals:   11/09/20 0554 11/09/20 0802 11/09/20 0811 11/09/20 1125  BP: (!) 166/92 (!) 178/95  109/60  Pulse: 89 88  88  Resp:  (!) 30 (!) 22 (!) 24  Temp:  98.3 F (36.8 C)  98.2 F (36.8 C)  TempSrc:  Oral  Oral  SpO2:  97%  100%  Weight:      Height:        Intake/Output Summary (Last 24 hours) at 11/09/2020 1307 Last data filed at 11/09/2020 1029 Gross per 24 hour  Intake 261.22 ml  Output 750 ml  Net -488.78 ml   Physical Exam: A&Ox3, NAD CV: RRR Pulmonary: CTA Bilaterally Abdomen: Soft, Nontender, Nondistended Vascular:             Left lower extremity: Thigh soft. Calf soft. Extremity is warm. Erythema is noted to the left foot.Foul odor noted. Gangrenous changes to the first and second toes. Gangrenous changes tracked back towards the heel.   Laboratory: CBC    Component Value Date/Time   WBC 21.6 (H) 11/08/2020 0435   HGB 12.3 (L) 11/08/2020 0435   HGB 15.1 11/23/2014 2006   HCT 37.8 (L) 11/08/2020 0435   HCT 46.1 11/23/2014 2006   PLT 378 11/08/2020 0435   PLT 229 11/23/2014 2006   BMET    Component Value Date/Time   NA 136 11/09/2020 0509   NA 135 11/23/2014 2006   K 3.9 11/09/2020 0509   K 4.0 11/23/2014 2006   CL 107 11/09/2020 0509   CL 100 (L) 11/23/2014 2006   CO2 22 11/09/2020 0509   CO2 24 11/23/2014 2006   GLUCOSE 114 (H) 11/09/2020 0509   GLUCOSE 370 (H) 11/23/2014 2006   BUN 26 (H) 11/09/2020 0509   BUN 40 (H) 11/23/2014 2006   CREATININE 1.13 11/09/2020 0509   CREATININE 2.16 (H) 11/23/2014 2006   CALCIUM 8.5 (L) 11/09/2020 0509   CALCIUM 8.6 (L) 11/23/2014 2006   GFRNONAA >60 11/09/2020 0509   GFRNONAA 31 (L) 11/23/2014 2006   GFRAA 37 (L) 12/24/2019 1608   GFRAA 36 (L) 11/23/2014 2006   Assessment/Planning: The  patient is a 70 year old male with known peripheral artery disease status post recent endovascular intervention who presents to the hospital with progressively worsening gangrene and discomfort to the left foot  1) physical exam is stable 2) afebrile with stable vital signs 3) patient is on the operating room schedule for a left below the knee amputation with Dr. Wyn Quaker on Thursday.   Discussed with Dr. Wallis Mart Jabarie Pop PA-C 11/09/2020 1:07 PM

## 2020-11-09 NOTE — Progress Notes (Signed)
Progress Note  Patient Name: Danny GOLEY Sr. Date of Encounter: 11/09/2020  [facility] Cardiologist: Julien Nordmann, MD   Subjective   Echo showed reduced EF 35-40%. Patient is scheduled for BKA tomorrow. Has lower leg edema on the left side. No chest pain or significant sob.   Inpatient Medications    Scheduled Meds: . aspirin EC  81 mg Oral Daily  . atorvastatin  80 mg Oral QHS  . carvedilol  25 mg Oral BID WC  . enoxaparin (LOVENOX) injection  0.5 mg/kg Subcutaneous Q24H  . escitalopram  10 mg Oral Daily  . ezetimibe  10 mg Oral Daily  . furosemide  20 mg Oral Daily  . insulin aspart  0-15 Units Subcutaneous TID WC  . insulin aspart  0-5 Units Subcutaneous QHS  . insulin glargine  28 Units Subcutaneous Daily  . isosorbide mononitrate  30 mg Oral Daily  . lisinopril  5 mg Oral Daily  . potassium chloride  10 mEq Oral Daily  . senna-docusate  2 tablet Oral BID  . tamsulosin  0.4 mg Oral Daily   Continuous Infusions: . ceFEPime (MAXIPIME) IV 2 g (11/09/20 0645)  . metronidazole 500 mg (11/09/20 0955)  . vancomycin 1,250 mg (11/08/20 1813)   PRN Meds: acetaminophen **OR** acetaminophen, baclofen, ipratropium-albuterol, morphine injection, nitroGLYCERIN, ondansetron **OR** ondansetron (ZOFRAN) IV, oxyCODONE   Vital Signs    Vitals:   11/09/20 0554 11/09/20 0802 11/09/20 0811 11/09/20 1125  BP: (!) 166/92 (!) 178/95  109/60  Pulse: 89 88  88  Resp:  (!) 30 (!) 22 (!) 24  Temp:  98.3 F (36.8 C)  98.2 F (36.8 C)  TempSrc:  Oral  Oral  SpO2:  97%  100%  Weight:      Height:        Intake/Output Summary (Last 24 hours) at 11/09/2020 1152 Last data filed at 11/09/2020 1029 Gross per 24 hour  Intake 261.22 ml  Output 750 ml  Net -488.78 ml   Last 3 Weights 11/06/2020 10/31/2020 10/28/2020  Weight (lbs) 220 lb 7.4 oz 228 lb 228 lb  Weight (kg) 100 kg 103.42 kg 103.42 kg      Telemetry    N/A - Personally Reviewed  ECG    No new tracing - Personally  Reviewed  Physical Exam   GEN: No acute distress.   Neck: No JVD Cardiac: RRR, no murmurs, rubs, or gallops.  Respiratory:diffusely diminished GI: Soft, nontender, non-distended  MS: left lower leg edema; left toe gangrenous Neuro:  Nonfocal  Psych: Normal affect   Labs    High Sensitivity Troponin:  No results for input(s): TROPONINIHS in the last 720 hours.    Chemistry Recent Labs  Lab 11/06/20 1541 11/07/20 0847 11/08/20 0435 11/09/20 0509  NA 135  --  135 136  K 3.9  --  3.7 3.9  CL 102  --  106 107  CO2 21*  --  21* 22  GLUCOSE 212*  --  103* 114*  BUN 23  --  23 26*  CREATININE 1.27* 1.13 1.11 1.13  CALCIUM 8.6*  --  8.3* 8.5*  PROT 7.0  --   --  6.5  ALBUMIN 3.0*  --   --  2.4*  AST 74*  --   --  46*  ALT 99*  --   --  60*  ALKPHOS 129*  --   --  120  BILITOT 0.8  --   --  0.8  GFRNONAA >  60 >60 >60 >60  ANIONGAP 12  --  8 7     Hematology Recent Labs  Lab 11/06/20 1541 11/08/20 0435  WBC 24.4* 21.6*  RBC 4.54 4.07*  HGB 13.5 12.3*  HCT 42.3 37.8*  MCV 93.2 92.9  MCH 29.7 30.2  MCHC 31.9 32.5  RDW 12.7 12.7  PLT 407* 378    BNPNo results for input(s): BNP, PROBNP in the last 168 hours.   DDimer No results for input(s): DDIMER in the last 168 hours.   Radiology    DG Chest Port 1 View  Result Date: 11/07/2020 CLINICAL DATA:  Short of breath EXAM: PORTABLE CHEST 1 VIEW COMPARISON:  11/06/2020 FINDINGS: Streaky densities in the right lung base compatible with scarring unchanged from prior studies. Chronic elevation right hemidiaphragm unchanged Left lung is clear. Negative for heart failure or pneumonia. No pleural effusion. Apical emphysema. IMPRESSION: No acute abnormality. Scarring in the right lung base. Apical emphysema. Electronically Signed   By: Marlan Palauharles  Clark M.D.   On: 11/07/2020 17:01   ECHOCARDIOGRAM COMPLETE  Result Date: 11/09/2020    ECHOCARDIOGRAM REPORT   Patient Name:   [name] Date of Exam: 11/08/2020 Medical Rec  #:  [id number]              Height:       68.0 in Accession #:    [id number]             Weight:       220.5 lb Date of Birth:  24-Jan-1951              BSA:          2.130 m Patient Age:    70 years               BP:           159/109 mmHg Patient Gender: M                      HR:           90 bpm. Exam Location:  [facility] Procedure: 2D Echo, Cardiac Doppler, Color Doppler and Intracardiac            Opacification Agent Indications:     I50.9 Congestive heart failure  History:         Patient has prior history of Echocardiogram examinations, most                  recent 04/05/2017. Risk Factors:Hypertension, Dyslipidemia and                  Diabetes. Ischemic cardiomyopathy. COPD. Chronic kidney                  disease. Coronary artery disease.  Sonographer:     Sedonia SmallNaTashia Rodgers-Jones Referring Phys:  [id number] De Jaworski H Ronnie Doo Diagnosing Phys: Yvonne Kendallhristopher End MD  Sonographer Comments: Technically difficult study due to poor echo windows. IMPRESSIONS  1. Left ventricular ejection fraction, by estimation, is 35 to 40%. The left ventricle has moderately decreased function. The left ventricle demonstrates global hypokinesis. The left ventricular internal cavity size was mildly dilated. Left ventricular diastolic parameters are consistent with Grade I diastolic dysfunction (impaired relaxation).  2. Right ventricular systolic function is normal. The right ventricular size is normal. Tricuspid regurgitation signal is inadequate for assessing PA pressure.  3. The mitral valve is grossly normal. Trivial mitral valve regurgitation. No evidence of mitral  stenosis.  4. The aortic valve was not well visualized. Aortic valve regurgitation is not visualized. No aortic stenosis is present.  5. The inferior vena cava is normal in size with <50% respiratory variability, suggesting right atrial pressure of 8 mmHg. FINDINGS  Left Ventricle: Left ventricular ejection fraction, by estimation, is 35 to 40%. The left ventricle has moderately  decreased function. The left ventricle demonstrates global hypokinesis. Definity contrast agent was given IV to delineate the left ventricular endocardial borders. The left ventricular internal cavity size was mildly dilated. There is no left ventricular hypertrophy. Left ventricular diastolic parameters are consistent with Grade I diastolic dysfunction (impaired relaxation). Right Ventricle: The right ventricular size is normal. No increase in right ventricular wall thickness. Right ventricular systolic function is normal. Tricuspid regurgitation signal is inadequate for assessing PA pressure. Left Atrium: Left atrial size was normal in size. Right Atrium: Right atrial size was normal in size. Pericardium: There is no evidence of pericardial effusion. Mitral Valve: The mitral valve is grossly normal. Trivial mitral valve regurgitation. No evidence of mitral valve stenosis. Tricuspid Valve: The tricuspid valve is not well visualized. Tricuspid valve regurgitation is trivial. Aortic Valve: The aortic valve was not well visualized. Aortic valve regurgitation is not visualized. No aortic stenosis is present. Pulmonic Valve: The pulmonic valve was not well visualized. Pulmonic valve regurgitation is not visualized. No evidence of pulmonic stenosis. Aorta: The aortic root is normal in size and structure. Pulmonary Artery: The pulmonary artery is not well seen. Venous: The inferior vena cava is normal in size with less than 50% respiratory variability, suggesting right atrial pressure of 8 mmHg. IAS/Shunts: The interatrial septum was not well visualized.  LEFT VENTRICLE PLAX 2D LVIDd:         5.75 cm      Diastology LVIDs:         4.23 cm      LV e' medial:    4.68 cm/s LV PW:         0.97 cm      LV E/e' medial:  11.3 LV IVS:        0.79 cm      LV e' lateral:   4.57 cm/s LVOT diam:     2.30 cm      LV E/e' lateral: 11.6 LV SV:         43 LV SV Index:   20 LVOT Area:     4.15 cm  LV Volumes (MOD) LV vol d, MOD A2C:  116.6 ml LV vol d, MOD A4C: 102.0 ml LV vol s, MOD A2C: 69.6 ml LV vol s, MOD A4C: 63.6 ml LV SV MOD A2C:     47.0 ml LV SV MOD A4C:     102.0 ml LV SV MOD BP:      39.9 ml RIGHT VENTRICLE             IVC RV Basal diam:  3.58 cm     IVC diam: 1.53 cm RV S prime:     14.03 cm/s TAPSE (M-mode): 2.5 cm LEFT ATRIUM             Index       RIGHT ATRIUM           Index LA diam:        4.20 cm 1.97 cm/m  RA Area:     11.30 cm LA Vol (A2C):   47.7 ml 22.39 ml/m RA Volume:   26.20 ml  12.30  ml/m LA Vol (A4C):   40.8 ml 19.15 ml/m LA Biplane Vol: 44.5 ml 20.89 ml/m  AORTIC VALVE LVOT Vmax:   69.60 cm/s LVOT Vmean:  49.200 cm/s LVOT VTI:    0.104 m  AORTA Ao Root diam: 3.60 cm MITRAL VALVE MV Area (PHT): 4.15 cm    SHUNTS MV Decel Time: 183 msec    Systemic VTI:  0.10 m MV E velocity: 53.10 cm/s  Systemic Diam: 2.30 cm MV A velocity: 77.60 cm/s MV E/A ratio:  0.68 Cristal Deer End MD Electronically signed by Yvonne Kendall MD Signature Date/Time: 11/09/2020/6:44:25 AM    Final     Cardiac Studies   Echo 2018 Study Conclusions   - Left ventricle: The cavity size was normal. Systolic function was  normal. The estimated ejection fraction was in the range of 55%  to 60% based on apical images. The study is not technically  sufficient to allow evaluation of LV diastolic function.  - Right ventricle: Systolic function was normal.  - Pulmonary arteries: Systolic pressure could not be accurately  estimated.    Cardiac cath 07/2016   There is moderate to severe left ventricular systolic dysfunction.  The left ventricular ejection fraction is 25-35% by visual estimate.  There is trivial (1+) mitral regurgitation.  There is no aortic valve stenosis.  Mid LAD lesion, 20 %stenosed.  Ost 2nd Diag to 2nd Diag lesion, 90 %stenosed.  Dist LAD lesion, 40 %stenosed.  Ost 1st Diag lesion, 90 %stenosed.  1st Diag lesion, 80 %stenosed.  1st Mrg lesion, 90 %stenosed.  1st RPLB lesion, 90  %stenosed.  1. No evidence of obstructive disease affecting the main arteries. Patent mid LAD stent with mild in-stent restenosis and moderate LAD disease distally. The patient does have diffuse small vessel branch disease affecting 2 diagonals, OM1 and PL 1. The disease is distal. 2. Moderately to severely reduced LV systolic function with an EF of 25-30% with global hypokinesis. 3. Moderately elevated filling pressures.  Recommendations: Compared to his most recent cardiac catheterization in 2016, the ejection fraction has decreased significantly. The patient seems to have mixed ischemic/ nonischemic cardiomyopathy. Continue medical therapy for coronary artery disease. Optimize heart failure treatment. I increased the dose of lisinopril. The patient is volume overloaded and I started oral furosemide. I'm going to avoid aggressive diuresis due to contrast exposure. Consider screening for sleep apnea if that has not been done in the past.  Diagnostic Dominance: Right      Patient Profile     70 y.o. male  with a hx of CAD s/p LAD stenting, ICM, chronic combined CHF, stage 3 CKF, COPD, remote tobacco use, DM2, HTN, HLD, ED, obesity who is being seen today for the evaluation of pre-operative cardiac evaluation   Assessment & Plan    CAD with remote stenting -Status post LAD stenting with non-STEMI 07/2016.  Left heart cath showed moderate to severe diffuse small vessel disease and branch disease with patent LAD stent.  He was recommended for medical management. - Patient denies chest pain. He has chronic SOB that is unchanged. Patient is sedentary at baseline so difficult to determine exertional symptoms. - Continue Aspirin, statin, zetia, BB. Plavix held for amputation - Due to low EF patient will need cardiac cath, however can wait until after BKA surgery  HFpEF -Patient has history of mild left lower leg swelling, he says this is unchanged - He takes lasix 20 mg daily - Last  echo from 2018 showed LVEF 60-65%.  -  Echo this admission showed reduced EF 35-40%, global HK, G1DD, trivial MR - Patient will need heart cath as above, however can wait until after procedure - continue coreg, lisinopril  Hypertension - coreg  BID, Imdur  daily, lisinopril  daily - Bps reasonable  Diabetes type 2 - A1C 8.4 09/2020 - per IM  Hyperlipidemia -Most recent LDL 86, goal less than 70 -Continue Zetia and atorvastatin 80 mg daily  Bilateral carotid disease - Study in 2019 showed <39% b/l carotid stenosis.  - Lateral ultrasounds ordered at the last appointment. Nothing significant on exam -Aspirin, Plavix, Zetia  Pre-operative Cardiac eval PAD/PVD Left gangrenous Toe Sepsis - cardiac eval prior to left BTK amputation. He apparently was having claudication symptoms for 2-3 years, however was not seen until recently - Patient presented with gangrenous toe with cellulitis on IV abx.  - From a cardiac perspective he appears to be stable. No chest pain, although is he is sedentary at baseline. Has chronic DOE, multifactorial given CAD, COPD, history of COVID-19 deconditioning - Echo this admission showed reduced EF. He will need cardiac cath eventually, but this can wait until after BKA surgery. Patient is at a higher risk for surgery, however given current acute issues, benefits outweigh risks - METS<4. Unable to walk 1-2 blocks or perform light housework - According to cardiac risk assessment he Class IV risk, 15% 30 day risk of death, MI, or cardiac arrest  For questions or updates, please contact [facility] Please consult [email or web address] for contact info under        Signed, Davonda Ausley David Stall, PA-C  11/09/2020, 11:52 AM

## 2020-11-09 NOTE — Progress Notes (Signed)
PROGRESS NOTE    Danny DRAGONE Sr.  GEX:[id number] DOB: 01/13/1951 DOA: 11/06/2020 PCP: [name], MD   Chief complaint.  Left foot swelling. Brief Narrative:  Patient admitted 11/06/2020 with clinical sepsis and gangrene of the left first toe and starting of the second toe and also gangrene going down the plantar surface of the foot.  Patient had leukocytosis and tachycardia.  Patient has been following with podiatry as outpatient and also had a recent angiogram by vascular surgery where they did 3 angioplasties and one stent.  Patient seen in consultation by both podiatry and vascular surgery and he will be set up for a BKA on Thursday.  Past medical history type 2 diabetes mellitus, hyperlipidemia, CAD, chronic diastolic CHF, COPD, essential hypertension and chronic kidney disease stage II   Assessment & Plan:   Active Problems:   Type 2 diabetes mellitus with hyperlipidemia (HCC)   Chronic diastolic CHF (congestive heart failure) (HCC)   Sepsis (HCC)   Stage 2 chronic kidney disease   Thrush  #1.  Sepsis. Left foot cellulitis. POA Left foot gangrene in the first, second and third toe. Peripheral vascular disease Patient still has significant redness and tender in the left foot.  Gangrene of the first 3 digits. At this point, patient is scheduled for BKA tomorrow.  I will continue current antibiotics with vancomycin and Unasyn.  #2.  Uncontrolled type 2 diabetes with hyperglycemia Continue current Lantus and sliding scale insulin.  3.  Essential hypertension. Continue home medicines per  4.  Chronic diastolic congestive heart failure. Stable, no evidence of volume overload.  5.  COPD Continue home medicines.      DVT prophylaxis: Hold for surgery Code Status: Full Family Communication:  Disposition Plan:  .   Status is: Inpatient  Remains inpatient appropriate because:Inpatient level of care appropriate due to severity of illness   Dispo: The patient is  from: Home              Anticipated d/c is to: pending              Patient currently is not medically stable to d/c.   Difficult to place patient No        I/O last 3 completed shifts: In: 1224.6 [IV Piggyback:1224.6] Out: 1050 [Urine:1050] Total I/O In: 200 [P.O.:200] Out: 150 [Urine:150]     Consultants:   Vascular  Procedures: Pending  Antimicrobials:  Unasyn and vancomycin.  Subjective: Patient still complaining left foot pain, no fever chills per No abdominal pain or nausea vomiting. No short of breath or cough No dysuria hematuria  No chest pain or palpitation.    Objective: Vitals:   11/09/20 0554 11/09/20 0802 11/09/20 0811 11/09/20 1125  BP: (!) 166/92 (!) 178/95  109/60  Pulse: 89 88  88  Resp:  (!) 30 (!) 22 (!) 24  Temp:  98.3 F (36.8 C)  98.2 F (36.8 C)  TempSrc:  Oral  Oral  SpO2:  97%  100%  Weight:      Height:        Intake/Output Summary (Last 24 hours) at 11/09/2020 1419 Last data filed at 11/09/2020 1413 Gross per 24 hour  Intake 335.91 ml  Output 750 ml  Net -414.09 ml   Filed Weights   11/06/20 1536  Weight: 100 kg    Examination:  General exam: Appears calm and comfortable  Respiratory system: Clear to auscultation. Respiratory effort normal. Cardiovascular system: S1 & S2 heard, RRR. No  JVD, murmurs, rubs, gallops or clicks.  Gastrointestinal system: Abdomen is nondistended, soft and nontender. No organomegaly or masses felt. Normal bowel sounds heard. Central nervous system: Alert and oriented. No focal neurological deficits. Extremities: Left foot red and tender.  First 3 digits gangrene Skin: No rashes, lesions or ulcers Psychiatry: Judgement and insight appear normal. Mood & affect appropriate.     Data Reviewed: I have personally reviewed following labs and imaging studies  CBC: Recent Labs  Lab 11/06/20 1541 11/08/20 0435  WBC 24.4* 21.6*  NEUTROABS 20.6*  --   HGB 13.5 12.3*  HCT 42.3 37.8*  MCV 93.2  92.9  PLT 407* 378   Basic Metabolic Panel: Recent Labs  Lab 11/06/20 1541 11/07/20 0847 11/08/20 0435 11/09/20 0509  NA 135  --  135 136  K 3.9  --  3.7 3.9  CL 102  --  106 107  CO2 21*  --  21* 22  GLUCOSE 212*  --  103* 114*  BUN 23  --  23 26*  CREATININE 1.27* 1.13 1.11 1.13  CALCIUM 8.6*  --  8.3* 8.5*   GFR: Estimated Creatinine Clearance: 70.7 mL/min (by C-G formula based on SCr of 1.13 mg/dL). Liver Function Tests: Recent Labs  Lab 11/06/20 1541 11/09/20 0509  AST 74* 46*  ALT 99* 60*  ALKPHOS 129* 120  BILITOT 0.8 0.8  PROT 7.0 6.5  ALBUMIN 3.0* 2.4*   No results for input(s): LIPASE, AMYLASE in the last 168 hours. No results for input(s): AMMONIA in the last 168 hours. Coagulation Profile: No results for input(s): INR, PROTIME in the last 168 hours. Cardiac Enzymes: No results for input(s): CKTOTAL, CKMB, CKMBINDEX, TROPONINI in the last 168 hours. BNP (last 3 results) No results for input(s): PROBNP in the last 8760 hours. HbA1C: No results for input(s): HGBA1C in the last 72 hours. CBG: Recent Labs  Lab 11/08/20 1126 11/08/20 1650 11/08/20 2135 11/09/20 0754 11/09/20 1135  GLUCAP 133* 175* 157* 116* 196*   Lipid Profile: No results for input(s): CHOL, HDL, LDLCALC, TRIG, CHOLHDL, LDLDIRECT in the last 72 hours. Thyroid Function Tests: No results for input(s): TSH, T4TOTAL, FREET4, T3FREE, THYROIDAB in the last 72 hours. Anemia Panel: No results for input(s): VITAMINB12, FOLATE, FERRITIN, TIBC, IRON, RETICCTPCT in the last 72 hours. Sepsis Labs: Recent Labs  Lab 11/06/20 1541 11/06/20 2007  LATICACIDVEN 1.7 1.1    Recent Results (from the past 240 hour(s))  Culture, blood (routine x 2)     Status: None (Preliminary result)   Collection Time: 11/06/20  3:14 PM   Specimen: BLOOD  Result Value Ref Range Status   Specimen Description BLOOD  RT [facility]  Final   Special Requests   Final    BOTTLES DRAWN AEROBIC AND ANAEROBIC Blood Culture  adequate volume   Culture   Final    NO GROWTH 3 DAYS Performed at [facility], [address], [place], [place] [address]    Report Status PENDING  Incomplete  Culture, blood (routine x 2)     Status: None (Preliminary result)   Collection Time: 11/06/20  3:41 PM   Specimen: BLOOD  Result Value Ref Range Status   Specimen Description BLOOD  RT [facility]  Final   Special Requests   Final    BOTTLES DRAWN AEROBIC AND ANAEROBIC Blood Culture adequate volume   Culture   Final    NO GROWTH 3 DAYS Performed at [facility], [address] [place], [place] [address]  Report Status PENDING  Incomplete  SARS CORONAVIRUS 2 (TAT 6-24 HRS) Nasopharyngeal Nasopharyngeal Swab     Status: None   Collection Time: 11/06/20 11:35 PM   Specimen: Nasopharyngeal Swab  Result Value Ref Range Status   SARS Coronavirus 2 NEGATIVE NEGATIVE Final    Comment: (NOTE) SARS-CoV-2 target nucleic acids are NOT DETECTED.  The SARS-CoV-2 RNA is generally detectable in upper and lower respiratory specimens during the acute phase of infection. Negative results do not preclude SARS-CoV-2 infection, do not rule out co-infections with other pathogens, and should not be used as the sole basis for treatment or other patient management decisions. Negative results must be combined with clinical observations, patient history, and epidemiological information. The expected result is Negative.  Fact Sheet for Patients: HairSlick.nohttps://www.fda.gov/media/138098/download  Fact Sheet for Healthcare Providers: quierodirigir.comhttps://www.fda.gov/media/138095/download  This test is not yet approved or cleared by the [place] FDA and  has been authorized for detection and/or diagnosis of SARS-CoV-2 by FDA under an Emergency Use Authorization (EUA). This EUA will remain  in effect (meaning this test can be used) for the duration of the COVID-19 declaration under Se ction 564(b)(1) of the Act, 21 U.S.C. section  360bbb-3(b)(1), unless the authorization is terminated or revoked sooner.  Performed at [facility], [address] [place], [place] [address]          Radiology Studies: DG Chest Port 1 View  Result Date: 11/07/2020 CLINICAL DATA:  Short of breath EXAM: PORTABLE CHEST 1 VIEW COMPARISON:  11/06/2020 FINDINGS: Streaky densities in the right lung base compatible with scarring unchanged from prior studies. Chronic elevation right hemidiaphragm unchanged Left lung is clear. Negative for heart failure or pneumonia. No pleural effusion. Apical emphysema. IMPRESSION: No acute abnormality. Scarring in the right lung base. Apical emphysema. Electronically Signed   By: Marlan Palauharles  Clark M.D.   On: 11/07/2020 17:01   ECHOCARDIOGRAM COMPLETE  Result Date: 11/09/2020    ECHOCARDIOGRAM REPORT   Patient Name:   Sr. Gerrit FriendsRobert M Cloyd Sr. Date of Exam: 11/08/2020 Medical Rec #:  [id number]              Height:       68.0 in Accession #:    [id number]             Weight:       220.5 lb Date of Birth:  13-Jul-1951              BSA:          2.130 m Patient Age:    69 years               BP:           159/109 mmHg Patient Gender: M                      HR:           90 bpm. Exam Location:  [facility] Procedure: 2D Echo, Cardiac Doppler, Color Doppler and Intracardiac            Opacification Agent Indications:     I50.9 Congestive heart failure  History:         Patient has prior history of Echocardiogram examinations, most                  recent 04/05/2017. Risk Factors:Hypertension, Dyslipidemia and                  Diabetes. Ischemic  cardiomyopathy. COPD. Chronic kidney                  disease. Coronary artery disease.  Sonographer:     Sedonia Small Rodgers-Jones Referring Phys:  [id number] CADENCE H FURTH Diagnosing Phys: Yvonne Kendall MD  Sonographer Comments: Technically difficult study due to poor echo windows. IMPRESSIONS  1. Left ventricular ejection fraction, by estimation, is 35 to 40%. The left ventricle has moderately  decreased function. The left ventricle demonstrates global hypokinesis. The left ventricular internal cavity size was mildly dilated. Left ventricular diastolic parameters are consistent with Grade I diastolic dysfunction (impaired relaxation).  2. Right ventricular systolic function is normal. The right ventricular size is normal. Tricuspid regurgitation signal is inadequate for assessing PA pressure.  3. The mitral valve is grossly normal. Trivial mitral valve regurgitation. No evidence of mitral stenosis.  4. The aortic valve was not well visualized. Aortic valve regurgitation is not visualized. No aortic stenosis is present.  5. The inferior vena cava is normal in size with <50% respiratory variability, suggesting right atrial pressure of 8 mmHg. FINDINGS  Left Ventricle: Left ventricular ejection fraction, by estimation, is 35 to 40%. The left ventricle has moderately decreased function. The left ventricle demonstrates global hypokinesis. Definity contrast agent was given IV to delineate the left ventricular endocardial borders. The left ventricular internal cavity size was mildly dilated. There is no left ventricular hypertrophy. Left ventricular diastolic parameters are consistent with Grade I diastolic dysfunction (impaired relaxation). Right Ventricle: The right ventricular size is normal. No increase in right ventricular wall thickness. Right ventricular systolic function is normal. Tricuspid regurgitation signal is inadequate for assessing PA pressure. Left Atrium: Left atrial size was normal in size. Right Atrium: Right atrial size was normal in size. Pericardium: There is no evidence of pericardial effusion. Mitral Valve: The mitral valve is grossly normal. Trivial mitral valve regurgitation. No evidence of mitral valve stenosis. Tricuspid Valve: The tricuspid valve is not well visualized. Tricuspid valve regurgitation is trivial. Aortic Valve: The aortic valve was not well visualized. Aortic valve  regurgitation is not visualized. No aortic stenosis is present. Pulmonic Valve: The pulmonic valve was not well visualized. Pulmonic valve regurgitation is not visualized. No evidence of pulmonic stenosis. Aorta: The aortic root is normal in size and structure. Pulmonary Artery: The pulmonary artery is not well seen. Venous: The inferior vena cava is normal in size with less than 50% respiratory variability, suggesting right atrial pressure of 8 mmHg. IAS/Shunts: The interatrial septum was not well visualized.  LEFT VENTRICLE PLAX 2D LVIDd:         5.75 cm      Diastology LVIDs:         4.23 cm      LV e' medial:    4.68 cm/s LV PW:         0.97 cm      LV E/e' medial:  11.3 LV IVS:        0.79 cm      LV e' lateral:   4.57 cm/s LVOT diam:     2.30 cm      LV E/e' lateral: 11.6 LV SV:         43 LV SV Index:   20 LVOT Area:     4.15 cm  LV Volumes (MOD) LV vol d, MOD A2C: 116.6 ml LV vol d, MOD A4C: 102.0 ml LV vol s, MOD A2C: 69.6 ml LV vol s, MOD A4C: 63.6 ml LV SV MOD  A2C:     47.0 ml LV SV MOD A4C:     102.0 ml LV SV MOD BP:      39.9 ml RIGHT VENTRICLE             IVC RV Basal diam:  3.58 cm     IVC diam: 1.53 cm RV S prime:     14.03 cm/s TAPSE (M-mode): 2.5 cm LEFT ATRIUM             Index       RIGHT ATRIUM           Index LA diam:        4.20 cm 1.97 cm/m  RA Area:     11.30 cm LA Vol (A2C):   47.7 ml 22.39 ml/m RA Volume:   26.20 ml  12.30 ml/m LA Vol (A4C):   40.8 ml 19.15 ml/m LA Biplane Vol: 44.5 ml 20.89 ml/m  AORTIC VALVE LVOT Vmax:   69.60 cm/s LVOT Vmean:  49.200 cm/s LVOT VTI:    0.104 m  AORTA Ao Root diam: 3.60 cm MITRAL VALVE MV Area (PHT): 4.15 cm    SHUNTS MV Decel Time: 183 msec    Systemic VTI:  0.10 m MV E velocity: 53.10 cm/s  Systemic Diam: 2.30 cm MV A velocity: 77.60 cm/s MV E/A ratio:  0.68 Cristal Deer End MD Electronically signed by Yvonne Kendall MD Signature Date/Time: 11/09/2020/6:44:25 AM    Final         Scheduled Meds: . aspirin EC  81 mg Oral Daily  .  atorvastatin  80 mg Oral QHS  . carvedilol  25 mg Oral BID WC  . escitalopram  10 mg Oral Daily  . ezetimibe  10 mg Oral Daily  . furosemide  20 mg Oral Daily  . insulin aspart  0-15 Units Subcutaneous TID WC  . insulin aspart  0-5 Units Subcutaneous QHS  . insulin glargine  28 Units Subcutaneous Daily  . isosorbide mononitrate  30 mg Oral Daily  . lisinopril  5 mg Oral Daily  . potassium chloride  10 mEq Oral Daily  . senna-docusate  2 tablet Oral BID  . tamsulosin  0.4 mg Oral Daily   Continuous Infusions: . [START ON 11/10/2020] sodium chloride    . ceFEPime (MAXIPIME) IV 2 g (11/09/20 0645)  . metronidazole 500 mg (11/09/20 0955)  . vancomycin 1,250 mg (11/08/20 1813)     LOS: 3 days    Time spent: 28 minutes    Marrion Coy, MD [facility]   To contact the attending provider between 7A-7P or the covering provider during after hours 7P-7A, please log into the web site [email or web address] and access using universal [place] password for that web site. If you do not have the password, please call the hospital operator.  11/09/2020, 2:19 PM

## 2020-11-09 NOTE — Progress Notes (Signed)
PT Cancellation Note  Patient Details Name: Danny DONATELLI Sr. MRN: [id number] DOB: 1950-10-21   Cancelled Treatment:    Reason Eval/Treat Not Completed: Other (comment). Chart reviewed. Pt scheduled for L BKA Thursday with vascular Sx. Will need new orders post op for disposition. Will cancel current orders at this time and follow after surgery.   Mandeep Ferch 11/09/2020, 8:59 AM  Elizabeth Palau, PT, DPT [phone]

## 2020-11-10 ENCOUNTER — Encounter
Admission: EM | Disposition: A | Discharge status: Skilled Nursing Facility | Payer: 59 | Source: Home / Self Care | Attending: Internal Medicine

## 2020-11-10 ENCOUNTER — Encounter: Payer: Self-pay | Admitting: Internal Medicine

## 2020-11-10 ENCOUNTER — Inpatient Hospital Stay: Payer: 59 | Admitting: Anesthesiology

## 2020-11-10 DIAGNOSIS — L03116 Cellulitis of left lower limb: Secondary | ICD-10-CM | POA: Diagnosis not present

## 2020-11-10 DIAGNOSIS — I96 Gangrene, not elsewhere classified: Secondary | ICD-10-CM

## 2020-11-10 DIAGNOSIS — I5032 Chronic diastolic (congestive) heart failure: Secondary | ICD-10-CM | POA: Diagnosis not present

## 2020-11-10 HISTORY — PX: AMPUTATION: SHX166

## 2020-11-10 LAB — GLUCOSE, CAPILLARY
Glucose-Capillary: 138 mg/dL — ABNORMAL HIGH (ref 70–99)
Glucose-Capillary: 129 mg/dL — ABNORMAL HIGH (ref 70–99)
Glucose-Capillary: 110 mg/dL — ABNORMAL HIGH (ref 70–99)
Glucose-Capillary: 109 mg/dL — ABNORMAL HIGH (ref 70–99)
Glucose-Capillary: 121 mg/dL — ABNORMAL HIGH (ref 70–99)
Glucose-Capillary: 161 mg/dL — ABNORMAL HIGH (ref 70–99)

## 2020-11-10 LAB — CBC
HCT: 40.1 % (ref 39.0–52.0)
Hemoglobin: 12.6 g/dL — ABNORMAL LOW (ref 13.0–17.0)
MCH: 29.3 pg (ref 26.0–34.0)
MCHC: 31.4 g/dL (ref 30.0–36.0)
MCV: 93.3 fL (ref 80.0–100.0)
Platelets: 479 10*3/uL — ABNORMAL HIGH (ref 150–400)
RBC: 4.3 MIL/uL (ref 4.22–5.81)
RDW: 13 % (ref 11.5–15.5)
WBC: 19.1 10*3/uL — ABNORMAL HIGH (ref 4.0–10.5)
nRBC: 0 % (ref 0.0–0.2)

## 2020-11-10 LAB — TYPE AND SCREEN
ABO/RH(D): A POS
Antibody Screen: NEGATIVE
PT AG Type: POSITIVE

## 2020-11-10 LAB — APTT: aPTT: 42 seconds — ABNORMAL HIGH (ref 24–36)

## 2020-11-10 LAB — BASIC METABOLIC PANEL
Anion gap: 7 (ref 5–15)
BUN: 29 mg/dL — ABNORMAL HIGH (ref 8–23)
CO2: 23 mmol/L (ref 22–32)
Calcium: 8.5 mg/dL — ABNORMAL LOW (ref 8.9–10.3)
Chloride: 107 mmol/L (ref 98–111)
Creatinine, Ser: 1.32 mg/dL — ABNORMAL HIGH (ref 0.61–1.24)
GFR, Estimated: 58 mL/min — ABNORMAL LOW (ref >60)
Glucose, Bld: 133 mg/dL — ABNORMAL HIGH (ref 70–99)
Potassium: 4.3 mmol/L (ref 3.5–5.1)
Sodium: 137 mmol/L (ref 135–145)

## 2020-11-10 LAB — MAGNESIUM: Magnesium: 2.1 mg/dL (ref 1.7–2.4)

## 2020-11-10 LAB — PROTIME-INR
INR: 1.3 — ABNORMAL HIGH (ref 0.8–1.2)
Prothrombin Time: 15.8 seconds — ABNORMAL HIGH (ref 11.4–15.2)

## 2020-11-10 SURGERY — AMPUTATION BELOW KNEE
Anesthesia: General | Site: Knee | Laterality: Left

## 2020-11-10 MED ORDER — PHENYLEPHRINE HCL (PRESSORS) 10 MG/ML IV SOLN
INTRAVENOUS | Status: AC
  Filled 2020-11-10: qty 1

## 2020-11-10 MED ORDER — HYDROCODONE-ACETAMINOPHEN 7.5-325 MG PO TABS: 1.0000 | ORAL_TABLET | Freq: Once | ORAL | Status: DC | PRN

## 2020-11-10 MED ORDER — EPHEDRINE SULFATE 50 MG/ML IJ SOLN
INTRAMUSCULAR | Status: DC | PRN
  Administered 2020-11-10: 5 mg via INTRAVENOUS

## 2020-11-10 MED ORDER — MIDAZOLAM HCL 2 MG/2ML IJ SOLN
INTRAMUSCULAR | Status: DC | PRN
  Administered 2020-11-10: 1 mg via INTRAVENOUS

## 2020-11-10 MED ORDER — PHENYLEPHRINE HCL (PRESSORS) 10 MG/ML IV SOLN
INTRAVENOUS | Status: DC | PRN
  Administered 2020-11-10 (×3): 100 ug via INTRAVENOUS
  Administered 2020-11-10 (×2): 200 ug via INTRAVENOUS
  Administered 2020-11-10: 100 ug via INTRAVENOUS

## 2020-11-10 MED ORDER — DEXMEDETOMIDINE HCL 200 MCG/2ML IV SOLN
INTRAVENOUS | Status: DC | PRN
Start: 2020-11-10 — End: 2020-11-10
  Administered 2020-11-10: 8 ug via INTRAVENOUS
  Administered 2020-11-10: 12 ug via INTRAVENOUS

## 2020-11-10 MED ORDER — FENTANYL CITRATE (PF) 100 MCG/2ML IJ SOLN
INTRAMUSCULAR | Status: DC | PRN
  Administered 2020-11-10: 50 ug via INTRAVENOUS
  Administered 2020-11-10: 100 ug via INTRAVENOUS
  Administered 2020-11-10: 25 ug via INTRAVENOUS

## 2020-11-10 MED ORDER — PROPOFOL 10 MG/ML IV BOLUS
INTRAVENOUS | Status: DC | PRN
  Administered 2020-11-10: 150 mg via INTRAVENOUS

## 2020-11-10 MED ORDER — MORPHINE SULFATE (PF) 2 MG/ML IV SOLN
2.0000 mg | INTRAVENOUS | Status: DC | PRN
  Administered 2020-11-10 – 2020-11-13 (×5): 2 mg via INTRAVENOUS
  Filled 2020-11-10 (×5): qty 1

## 2020-11-10 MED ORDER — FENTANYL CITRATE (PF) 250 MCG/5ML IJ SOLN
INTRAMUSCULAR | Status: AC
  Filled 2020-11-10: qty 5

## 2020-11-10 MED ORDER — FENTANYL CITRATE (PF) 100 MCG/2ML IJ SOLN: 25.0000 ug | INTRAMUSCULAR | Status: DC | PRN

## 2020-11-10 MED ORDER — ALBUTEROL SULFATE HFA 108 (90 BASE) MCG/ACT IN AERS
INHALATION_SPRAY | RESPIRATORY_TRACT | Status: AC
  Filled 2020-11-10: qty 6.7

## 2020-11-10 MED ORDER — SUCCINYLCHOLINE CHLORIDE 20 MG/ML IJ SOLN
INTRAMUSCULAR | Status: DC | PRN
  Administered 2020-11-10: 120 mg via INTRAVENOUS

## 2020-11-10 MED ORDER — FLUMAZENIL 0.5 MG/5ML IV SOLN
INTRAVENOUS | Status: AC
  Administered 2020-11-10: 0.2 mg via INTRAVENOUS
  Filled 2020-11-10: qty 5

## 2020-11-10 MED ORDER — ALBUTEROL SULFATE HFA 108 (90 BASE) MCG/ACT IN AERS
INHALATION_SPRAY | RESPIRATORY_TRACT | Status: DC | PRN
  Administered 2020-11-10 (×3): 2 via RESPIRATORY_TRACT

## 2020-11-10 MED ORDER — IPRATROPIUM-ALBUTEROL 0.5-2.5 (3) MG/3ML IN SOLN
RESPIRATORY_TRACT | Status: AC
  Administered 2020-11-10: 3 mL via RESPIRATORY_TRACT
  Filled 2020-11-10: qty 3

## 2020-11-10 MED ORDER — OXYCODONE-ACETAMINOPHEN 5-325 MG PO TABS
1.0000 | ORAL_TABLET | Freq: Four times a day (QID) | ORAL | Status: DC | PRN
  Administered 2020-11-10 – 2020-11-11 (×2): 2 via ORAL
  Administered 2020-11-11: 15:00:00 1 via ORAL
  Administered 2020-11-11: 2 via ORAL
  Administered 2020-11-12 (×2): 1 via ORAL
  Administered 2020-11-13 (×3): 2 via ORAL
  Administered 2020-11-14 (×2): 1 via ORAL
  Administered 2020-11-15 (×2): 2 via ORAL
  Filled 2020-11-10: qty 2
  Filled 2020-11-10 (×3): qty 1
  Filled 2020-11-10 (×2): qty 2
  Filled 2020-11-10: qty 1
  Filled 2020-11-10 (×4): qty 2
  Filled 2020-11-10: qty 1
  Filled 2020-11-10: qty 2

## 2020-11-10 MED ORDER — FLUMAZENIL 0.5 MG/5ML IV SOLN: 0.2000 mg | Freq: Once | INTRAVENOUS | Status: AC

## 2020-11-10 MED ORDER — LIDOCAINE HCL (CARDIAC) PF 100 MG/5ML IV SOSY
PREFILLED_SYRINGE | INTRAVENOUS | Status: DC | PRN
  Administered 2020-11-10: 100 mg via INTRAVENOUS

## 2020-11-10 MED ORDER — ONDANSETRON HCL 4 MG/2ML IJ SOLN: 4.0000 mg | Freq: Once | INTRAMUSCULAR | Status: DC | PRN

## 2020-11-10 MED ORDER — PROPOFOL 10 MG/ML IV BOLUS
INTRAVENOUS | Status: AC
  Filled 2020-11-10: qty 20

## 2020-11-10 MED ORDER — ONDANSETRON HCL 4 MG/2ML IJ SOLN
INTRAMUSCULAR | Status: DC | PRN
  Administered 2020-11-10: 4 mg via INTRAVENOUS

## 2020-11-10 MED ORDER — CHLORHEXIDINE GLUCONATE CLOTH 2 % EX PADS
6.0000 | MEDICATED_PAD | Freq: Once | CUTANEOUS | Status: AC
  Administered 2020-11-10: 07:00:00 6 via TOPICAL

## 2020-11-10 MED ORDER — CEFAZOLIN SODIUM-DEXTROSE 2-4 GM/100ML-% IV SOLN
2.0000 g | INTRAVENOUS | Status: AC
  Administered 2020-11-10: 2 g via INTRAVENOUS
  Filled 2020-11-10: qty 100

## 2020-11-10 MED ORDER — IPRATROPIUM-ALBUTEROL 0.5-2.5 (3) MG/3ML IN SOLN: 3.0000 mL | RESPIRATORY_TRACT | Status: DC

## 2020-11-10 MED ORDER — ACETAMINOPHEN 10 MG/ML IV SOLN
INTRAVENOUS | Status: DC | PRN
  Administered 2020-11-10: 1000 mg via INTRAVENOUS

## 2020-11-10 MED ORDER — FLUMAZENIL 0.5 MG/5ML IV SOLN
0.2000 mg | Freq: Once | INTRAVENOUS | Status: AC
  Administered 2020-11-10: 0.2 mg via INTRAVENOUS

## 2020-11-10 MED ORDER — MIDAZOLAM HCL 2 MG/2ML IJ SOLN
INTRAMUSCULAR | Status: AC
  Filled 2020-11-10: qty 2

## 2020-11-10 MED ORDER — ONDANSETRON HCL 4 MG/2ML IJ SOLN
INTRAMUSCULAR | Status: AC
  Filled 2020-11-10: qty 2

## 2020-11-10 MED ORDER — ACETAMINOPHEN 10 MG/ML IV SOLN
INTRAVENOUS | Status: AC
  Filled 2020-11-10: qty 100

## 2020-11-10 MED ORDER — ACETAMINOPHEN 325 MG PO TABS: 325.0000 mg | ORAL_TABLET | ORAL | Status: DC | PRN

## 2020-11-10 MED ORDER — DEXMEDETOMIDINE (PRECEDEX) IN NS 20 MCG/5ML (4 MCG/ML) IV SYRINGE
PREFILLED_SYRINGE | INTRAVENOUS | Status: AC
  Filled 2020-11-10: qty 5

## 2020-11-10 MED ORDER — ACETAMINOPHEN 160 MG/5ML PO SOLN
325.0000 mg | ORAL | Status: DC | PRN
  Filled 2020-11-10: qty 20.3

## 2020-11-10 SURGICAL SUPPLY — 43 items
BLADE SAGITTAL WIDE XTHICK NO (BLADE) IMPLANT
BLADE SAW SAG 25.4X90 (BLADE) ×4 IMPLANT
BNDG COHESIVE 4X5 TAN STRL (GAUZE/BANDAGES/DRESSINGS) ×2 IMPLANT
BNDG ELASTIC 6X5.8 VLCR NS LF (GAUZE/BANDAGES/DRESSINGS) ×2 IMPLANT
BNDG ELASTIC 6X5.8 VLCR STR LF (GAUZE/BANDAGES/DRESSINGS) ×2 IMPLANT
BNDG GAUZE 4.5X4.1 6PLY STRL (MISCELLANEOUS) ×4 IMPLANT
BNDG GAUZE ELAST 4 BULKY (GAUZE/BANDAGES/DRESSINGS) ×2 IMPLANT
BRUSH SCRUB EZ  4% CHG (MISCELLANEOUS) ×1
BRUSH SCRUB EZ 4% CHG (MISCELLANEOUS) ×1 IMPLANT
CANISTER SUCT 1200ML W/VALVE (MISCELLANEOUS) ×2 IMPLANT
CHLORAPREP W/TINT 26 (MISCELLANEOUS) ×2 IMPLANT
COVER WAND RF STERILE (DRAPES) ×2 IMPLANT
DRAIN PENROSE 1/4X12 LTX STRL (WOUND CARE) IMPLANT
DRAPE INCISE IOBAN 66X45 STRL (DRAPES) IMPLANT
ELECT CAUTERY BLADE 6.4 (BLADE) ×2 IMPLANT
ELECT REM PT RETURN 9FT ADLT (ELECTROSURGICAL) ×2
ELECTRODE REM PT RTRN 9FT ADLT (ELECTROSURGICAL) ×1 IMPLANT
GAUZE XEROFORM 1X8 LF (GAUZE/BANDAGES/DRESSINGS) ×2 IMPLANT
GLOVE SURG ENC MOIS LTX SZ7 (GLOVE) ×2 IMPLANT
GLOVE SURG SYN 7.0 (GLOVE) ×2 IMPLANT
GLOVE SURG UNDER LTX SZ7.5 (GLOVE) ×2 IMPLANT
GOWN STRL REUS W/ TWL LRG LVL3 (GOWN DISPOSABLE) ×1 IMPLANT
GOWN STRL REUS W/ TWL XL LVL3 (GOWN DISPOSABLE) ×2 IMPLANT
GOWN STRL REUS W/TWL LRG LVL3 (GOWN DISPOSABLE) ×1
GOWN STRL REUS W/TWL XL LVL3 (GOWN DISPOSABLE) ×2
HANDLE YANKAUER SUCT BULB TIP (MISCELLANEOUS) ×2 IMPLANT
KIT TURNOVER KIT A (KITS) ×2 IMPLANT
LABEL OR SOLS (LABEL) ×2 IMPLANT
MANIFOLD NEPTUNE II (INSTRUMENTS) ×2 IMPLANT
NS IRRIG 1000ML POUR BTL (IV SOLUTION) ×2 IMPLANT
PACK EXTREMITY ARMC (MISCELLANEOUS) ×2 IMPLANT
PAD ABD DERMACEA PRESS 5X9 (GAUZE/BANDAGES/DRESSINGS) ×2 IMPLANT
PAD PREP 24X41 OB/GYN DISP (PERSONAL CARE ITEMS) ×2 IMPLANT
SPONGE LAP 18X18 RF (DISPOSABLE) ×2 IMPLANT
STAPLER SKIN PROX 35W (STAPLE) ×2 IMPLANT
STOCKINETTE M/LG 89821 (MISCELLANEOUS) ×2 IMPLANT
SUT SILK 2 0 (SUTURE) ×1
SUT SILK 2 0 SH (SUTURE) ×4 IMPLANT
SUT SILK 2-0 18XBRD TIE 12 (SUTURE) ×1 IMPLANT
SUT SILK 3 0 (SUTURE) ×1
SUT SILK 3-0 18XBRD TIE 12 (SUTURE) ×1 IMPLANT
SUT VIC AB 0 CT1 36 (SUTURE) ×4 IMPLANT
SUT VIC AB 2-0 CT1 (SUTURE) ×4 IMPLANT

## 2020-11-10 NOTE — Progress Notes (Signed)
PROGRESS NOTE    Danny PIZZI Sr.  LKG:[id number] DOB: 12/22/1950 DOA: 11/06/2020 PCP: [name], MD   Complaint for left foot swelling. Brief Narrative:  Patient admitted 11/06/2020 with clinical sepsis and gangrene of the left first toe and starting of the second toe and also gangrene going down the plantar surface of the foot. Patient had leukocytosis and tachycardia. Patient has been following with podiatry as outpatient and also had a recent angiogram by vascular surgery where they did 3 angioplasties and one stent. Patient seen in consultation by both podiatry and vascular surgery and he will be set up for a BKA on Thursday. Past medical history type 2 diabetes mellitus, hyperlipidemia, CAD, chronic diastolic CHF, COPD, essential hypertension and chronic kidney disease stage II   Assessment & Plan:   Active Problems:   Type 2 diabetes mellitus with hyperlipidemia (HCC)   Chronic diastolic CHF (congestive heart failure) (HCC)   Sepsis (HCC)   Gangrene of left foot (HCC)   Stage 2 chronic kidney disease   Thrush   Cellulitis of left foot   HFrEF (heart failure with reduced ejection fraction) (HCC)  #1.  Sepsis. Left foot cellulitis. POA Left foot gangrene in the first, second and third toe. Peripheral vascular disease. Patient is currently in surgery for BKA. Continue antibiotics with Unasyn and vancomycin.  2.  Uncontrolled type 2 diabetes with hyperglycemia Continue current regimen.  3.  Chronic diastolic congestive heart failure. COPD. Stable.   DVT prophylaxis: SCDs Code Status: Full Family Communication: Daughter at bedside Disposition Plan:  .   Status is: Inpatient  Remains inpatient appropriate because:Inpatient level of care appropriate due to severity of illness   Dispo: The patient is from: Home              Anticipated d/c is to: Home              Patient currently is not medically stable to d/c.   Difficult to place patient No         I/O last 3 completed shifts: In: 400 [P.O.:400] Out: 1825 [Urine:1825] Total I/O In: -  Out: 300 [Urine:300]     Consultants:   Vascular surgery  Procedures: BKA pending  Antimicrobials:  Unasyn and vancomycin.  Subjective: Seen patient early in the morning.  Patient did well, still complaining some left leg pain.  No fever or chills  No short of breath or cough. No abdominal pain or nausea vomiting. No dysuria hematuria.  Objective: Vitals:   11/09/20 2341 11/10/20 0335 11/10/20 0834 11/10/20 1216  BP: 133/84 (!) 153/91 (!) 176/100 (!) 137/107  Pulse: 98 98 94 95  Resp: Temp: 98.7 F (37.1 C) 98.7 F (37.1 C) 98.7 F (37.1 C) (!) 97.5 F (36.4 C)  TempSrc:      SpO2: 92% 92% 92% 92%  Weight:      Height:        Intake/Output Summary (Last 24 hours) at 11/10/2020 1332 Last data filed at 11/10/2020 0830 Gross per 24 hour  Intake 400 ml  Output 1575 ml  Net -1175 ml   Filed Weights   11/06/20 1536  Weight: 100 kg    Examination:  General exam: Appears calm and comfortable  Respiratory system: Clear to auscultation. Respiratory effort normal. Cardiovascular system: S1 & S2 heard, RRR. No JVD, murmurs, rubs, gallops or clicks. No pedal edema. Gastrointestinal system: Abdomen is nondistended, soft and nontender. No organomegaly or masses felt. Normal  bowel sounds heard. Central nervous system: Alert and oriented. No focal neurological deficits. Extremities: Left foot swelling and red, toes gangrene. Skin: No rashes, lesions or ulcers Psychiatry: Judgement and insight appear normal. Mood & affect appropriate.     Data Reviewed: I have personally reviewed following labs and imaging studies  CBC: Recent Labs  Lab 11/06/20 1541 11/08/20 0435 11/10/20 0507  WBC 24.4* 21.6* 19.1*  NEUTROABS 20.6*  --   --   HGB 13.5 12.3* 12.6*  HCT 42.3 37.8* 40.1  MCV 93.2 92.9 93.3  PLT 407* 378 479*   Basic Metabolic Panel: Recent Labs  Lab  11/06/20 1541 11/07/20 0847 11/08/20 0435 11/09/20 0509 11/10/20 0507  NA 135  --  135 136 137  K 3.9  --  3.7 3.9 4.3  CL 102  --  106 107 107  CO2 21*  --  21* 22 23  GLUCOSE 212*  --  103* 114* 133*  BUN 23  --  23 26* 29*  CREATININE 1.27* 1.13 1.11 1.13 1.32*  CALCIUM 8.6*  --  8.3* 8.5* 8.5*  MG  --   --   --   --  2.1   GFR: Estimated Creatinine Clearance: 60.5 mL/min (A) (by C-G formula based on SCr of 1.32 mg/dL (H)). Liver Function Tests: Recent Labs  Lab 11/06/20 1541 11/09/20 0509  AST 74* 46*  ALT 99* 60*  ALKPHOS 129* 120  BILITOT 0.8 0.8  PROT 7.0 6.5  ALBUMIN 3.0* 2.4*   No results for input(s): LIPASE, AMYLASE in the last 168 hours. No results for input(s): AMMONIA in the last 168 hours. Coagulation Profile: Recent Labs  Lab 11/10/20 0507  INR 1.3*   Cardiac Enzymes: No results for input(s): CKTOTAL, CKMB, CKMBINDEX, TROPONINI in the last 168 hours. BNP (last 3 results) No results for input(s): PROBNP in the last 8760 hours. HbA1C: No results for input(s): HGBA1C in the last 72 hours. CBG: Recent Labs  Lab 11/09/20 1548 11/09/20 2036 11/10/20 0514 11/10/20 0833 11/10/20 1213  GLUCAP 147* 200* 138* 129* 110*   Lipid Profile: No results for input(s): CHOL, HDL, LDLCALC, TRIG, CHOLHDL, LDLDIRECT in the last 72 hours. Thyroid Function Tests: No results for input(s): TSH, T4TOTAL, FREET4, T3FREE, THYROIDAB in the last 72 hours. Anemia Panel: No results for input(s): VITAMINB12, FOLATE, FERRITIN, TIBC, IRON, RETICCTPCT in the last 72 hours. Sepsis Labs: Recent Labs  Lab 11/06/20 1541 11/06/20 2007  LATICACIDVEN 1.7 1.1    Recent Results (from the past 240 hour(s))  Culture, blood (routine x 2)     Status: None (Preliminary result)   Collection Time: 11/06/20  3:14 PM   Specimen: BLOOD  Result Value Ref Range Status   Specimen Description BLOOD  RT [facility]  Final   Special Requests   Final    BOTTLES DRAWN AEROBIC AND ANAEROBIC Blood  Culture adequate volume   Culture   Final    NO GROWTH 4 DAYS Performed at [facility], [address] [place], [place] [address]    Report Status PENDING  Incomplete  Culture, blood (routine x 2)     Status: None (Preliminary result)   Collection Time: 11/06/20  3:41 PM   Specimen: BLOOD  Result Value Ref Range Status   Specimen Description BLOOD  RT FOREARM  Final   Special Requests   Final    BOTTLES DRAWN AEROBIC AND ANAEROBIC Blood Culture adequate volume   Culture   Final    NO GROWTH 4  DAYS Performed at [facility], [address] [place], [place] [address]    Report Status PENDING  Incomplete  SARS CORONAVIRUS 2 (TAT 6-24 HRS) Nasopharyngeal Nasopharyngeal Swab     Status: None   Collection Time: 11/06/20 11:35 PM   Specimen: Nasopharyngeal Swab  Result Value Ref Range Status   SARS Coronavirus 2 NEGATIVE NEGATIVE Final    Comment: (NOTE) SARS-CoV-2 target nucleic acids are NOT DETECTED.  The SARS-CoV-2 RNA is generally detectable in upper and lower respiratory specimens during the acute phase of infection. Negative results do not preclude SARS-CoV-2 infection, do not rule out co-infections with other pathogens, and should not be used as the sole basis for treatment or other patient management decisions. Negative results must be combined with clinical observations, patient history, and epidemiological information. The expected result is Negative.  Fact Sheet for Patients: HairSlick.nohttps://www.fda.gov/media/138098/download  Fact Sheet for Healthcare Providers: quierodirigir.comhttps://www.fda.gov/media/138095/download  This test is not yet approved or cleared by the [place] FDA and  has been authorized for detection and/or diagnosis of SARS-CoV-2 by FDA under an Emergency Use Authorization (EUA). This EUA will remain  in effect (meaning this test can be used) for the duration of the COVID-19 declaration under Se ction 564(b)(1) of the Act, 21 U.S.C. section  360bbb-3(b)(1), unless the authorization is terminated or revoked sooner.  Performed at [facility], [address] [place], [place] [address]          Radiology Studies: ECHOCARDIOGRAM COMPLETE  Result Date: 11/09/2020    ECHOCARDIOGRAM REPORT   Patient Name:   Sr. Gerrit FriendsRobert M Whitt Sr. Date of Exam: 11/08/2020 Medical Rec #:  [id number]              Height:       68.0 in Accession #:    [id number]             Weight:       220.5 lb Date of Birth:  Sep 04, 1950              BSA:          2.130 m Patient Age:    69 years               BP:           159/109 mmHg Patient Gender: M                      HR:           90 bpm. Exam Location:  [facility] Procedure: 2D Echo, Cardiac Doppler, Color Doppler and Intracardiac            Opacification Agent Indications:     I50.9 Congestive heart failure  History:         Patient has prior history of Echocardiogram examinations, most                  recent 04/05/2017. Risk Factors:Hypertension, Dyslipidemia and                  Diabetes. Ischemic cardiomyopathy. COPD. Chronic kidney                  disease. Coronary artery disease.  Sonographer:     Sedonia SmallNaTashia Rodgers-Jones Referring Phys:  [id number] CADENCE H FURTH Diagnosing Phys: Yvonne Kendallhristopher End MD  Sonographer Comments: Technically difficult study due to poor echo windows. IMPRESSIONS  1. Left ventricular ejection fraction, by estimation, is 35 to 40%. The  left ventricle has moderately decreased function. The left ventricle demonstrates global hypokinesis. The left ventricular internal cavity size was mildly dilated. Left ventricular diastolic parameters are consistent with Grade I diastolic dysfunction (impaired relaxation).  2. Right ventricular systolic function is normal. The right ventricular size is normal. Tricuspid regurgitation signal is inadequate for assessing PA pressure.  3. The mitral valve is grossly normal. Trivial mitral valve regurgitation. No evidence of mitral stenosis.  4. The aortic valve was not  well visualized. Aortic valve regurgitation is not visualized. No aortic stenosis is present.  5. The inferior vena cava is normal in size with <50% respiratory variability, suggesting right atrial pressure of 8 mmHg. FINDINGS  Left Ventricle: Left ventricular ejection fraction, by estimation, is 35 to 40%. The left ventricle has moderately decreased function. The left ventricle demonstrates global hypokinesis. Definity contrast agent was given IV to delineate the left ventricular endocardial borders. The left ventricular internal cavity size was mildly dilated. There is no left ventricular hypertrophy. Left ventricular diastolic parameters are consistent with Grade I diastolic dysfunction (impaired relaxation). Right Ventricle: The right ventricular size is normal. No increase in right ventricular wall thickness. Right ventricular systolic function is normal. Tricuspid regurgitation signal is inadequate for assessing PA pressure. Left Atrium: Left atrial size was normal in size. Right Atrium: Right atrial size was normal in size. Pericardium: There is no evidence of pericardial effusion. Mitral Valve: The mitral valve is grossly normal. Trivial mitral valve regurgitation. No evidence of mitral valve stenosis. Tricuspid Valve: The tricuspid valve is not well visualized. Tricuspid valve regurgitation is trivial. Aortic Valve: The aortic valve was not well visualized. Aortic valve regurgitation is not visualized. No aortic stenosis is present. Pulmonic Valve: The pulmonic valve was not well visualized. Pulmonic valve regurgitation is not visualized. No evidence of pulmonic stenosis. Aorta: The aortic root is normal in size and structure. Pulmonary Artery: The pulmonary artery is not well seen. Venous: The inferior vena cava is normal in size with less than 50% respiratory variability, suggesting right atrial pressure of 8 mmHg. IAS/Shunts: The interatrial septum was not well visualized.  LEFT VENTRICLE PLAX 2D LVIDd:          5.75 cm      Diastology LVIDs:         4.23 cm      LV e' medial:    4.68 cm/s LV PW:         0.97 cm      LV E/e' medial:  11.3 LV IVS:        0.79 cm      LV e' lateral:   4.57 cm/s LVOT diam:     2.30 cm      LV E/e' lateral: 11.6 LV SV:         43 LV SV Index:   20 LVOT Area:     4.15 cm  LV Volumes (MOD) LV vol d, MOD A2C: 116.6 ml LV vol d, MOD A4C: 102.0 ml LV vol s, MOD A2C: 69.6 ml LV vol s, MOD A4C: 63.6 ml LV SV MOD A2C:     47.0 ml LV SV MOD A4C:     102.0 ml LV SV MOD BP:      39.9 ml RIGHT VENTRICLE             IVC RV Basal diam:  3.58 cm     IVC diam: 1.53 cm RV S prime:     14.03 cm/s TAPSE (M-mode): 2.5 cm  LEFT ATRIUM             Index       RIGHT ATRIUM           Index LA diam:        4.20 cm 1.97 cm/m  RA Area:     11.30 cm LA Vol (A2C):   47.7 ml 22.39 ml/m RA Volume:   26.20 ml  12.30 ml/m LA Vol (A4C):   40.8 ml 19.15 ml/m LA Biplane Vol: 44.5 ml 20.89 ml/m  AORTIC VALVE LVOT Vmax:   69.60 cm/s LVOT Vmean:  49.200 cm/s LVOT VTI:    0.104 m  AORTA Ao Root diam: 3.60 cm MITRAL VALVE MV Area (PHT): 4.15 cm    SHUNTS MV Decel Time: 183 msec    Systemic VTI:  0.10 m MV E velocity: 53.10 cm/s  Systemic Diam: 2.30 cm MV A velocity: 77.60 cm/s MV E/A ratio:  0.68 Cristal Deer End MD Electronically signed by Yvonne Kendall MD Signature Date/Time: 11/09/2020/6:44:25 AM    Final         Scheduled Meds: . aspirin EC  81 mg Oral Daily  . atorvastatin  80 mg Oral QHS  . carvedilol  25 mg Oral BID WC  . escitalopram  10 mg Oral Daily  . ezetimibe  10 mg Oral Daily  . furosemide  20 mg Oral Daily  . insulin aspart  0-15 Units Subcutaneous TID WC  . insulin aspart  0-5 Units Subcutaneous QHS  . insulin glargine  28 Units Subcutaneous Daily  . isosorbide mononitrate  30 mg Oral Daily  . lisinopril  5 mg Oral Daily  . potassium chloride  10 mEq Oral Daily  . senna-docusate  2 tablet Oral BID  . tamsulosin  0.4 mg Oral Daily   Continuous Infusions: . sodium chloride 75 mL/hr at  11/09/20 2353  . ampicillin-sulbactam (UNASYN) IV 3 g (11/10/20 1237)  .  ceFAZolin (ANCEF) IV    . vancomycin 1,250 mg (11/09/20 1833)     LOS: 4 days    Time spent: 27 minutes    Marrion Coy, MD [facility]   To contact the attending provider between 7A-7P or the covering provider during after hours 7P-7A, please log into the web site [email or web address] and access using universal [place] password for that web site. If you do not have the password, please call the hospital operator.  11/10/2020, 1:32 PM

## 2020-11-10 NOTE — Anesthesia Procedure Notes (Signed)
Procedure Name: Intubation Date/Time: 11/10/2020 3:09 PM Performed by: Karoline Caldwell, CRNA Pre-anesthesia Checklist: Patient identified, Patient being monitored, Timeout performed, Emergency Drugs available and Suction available Patient Re-evaluated:Patient Re-evaluated prior to induction Oxygen Delivery Method: Circle system utilized Preoxygenation: Pre-oxygenation with 100% oxygen Induction Type: IV induction Ventilation: Mask ventilation without difficulty, Oral airway inserted - appropriate to patient size and Unable to mask ventilate Laryngoscope Size: McGraph and 4 Grade View: Grade I Tube type: Oral Tube size: 7.5 mm Number of attempts: 1 Airway Equipment and Method: Stylet and Video-laryngoscopy Placement Confirmation: ETT inserted through vocal cords under direct vision,  positive ETCO2 and breath sounds checked- equal and bilateral Secured at: 22 cm Tube secured with: Tape Dental Injury: Teeth and Oropharynx as per pre-operative assessment

## 2020-11-10 NOTE — Anesthesia Preprocedure Evaluation (Addendum)
Anesthesia Evaluation  Patient identified by MRN, date of birth, ID band Patient awake    Reviewed: Allergy & Precautions, H&P , NPO status , reviewed documented beta blocker date and time   Airway Mallampati: III  TM Distance: >3 FB Neck ROM: limited  Mouth opening: Limited Mouth Opening  Dental  (+) Edentulous Upper, Edentulous Lower   Pulmonary COPD, former smoker,     + decreased breath sounds      Cardiovascular hypertension, + angina + CAD, + Peripheral Vascular Disease and +CHF  Normal cardiovascular exam  11/2020 ECHO IMPRESSIONS    1. Left ventricular ejection fraction, by estimation, is 35 to 40%. The  left ventricle has moderately decreased function. The left ventricle  demonstrates global hypokinesis. The left ventricular internal cavity size  was mildly dilated. Left ventricular  diastolic parameters are consistent with Grade I diastolic dysfunction  (impaired relaxation).  2. Right ventricular systolic function is normal. The right ventricular  size is normal. Tricuspid regurgitation signal is inadequate for assessing  PA pressure.  3. The mitral valve is grossly normal. Trivial mitral valve  regurgitation. No evidence of mitral stenosis.  4. The aortic valve was not well visualized. Aortic valve regurgitation  is not visualized. No aortic stenosis is present.  5. The inferior vena cava is normal in size with <50% respiratory  variability, suggesting right atrial pressure of 8 mmHg.   Cardiology Note: Okay to proceed with surgical procedure, ischemic work-up can be performed at a future date after surgical procedure.   Neuro/Psych PSYCHIATRIC DISORDERS Anxiety Depression    GI/Hepatic GERD  Controlled,  Endo/Other  diabetesMorbid obesity  Renal/GU Renal disease     Musculoskeletal   Abdominal   Peds  Hematology   Anesthesia Other Findings Past Medical History: No date: CAD (coronary artery  disease)     Comment:  a. 01/2010 PCI of LAD; b. 09/2014 PCI/DES of mLAD due to               ISR. D1 80, D2 80(jailed), LCX 78m; c. 07/2016               NSTEMI/Cath: LM nl, LAD 36m ISR, 40d, D1 90ost, 80p, D2               90ost, RI min irregs, LCX min irregs, OM1 90 small, OM2/3              min irregs, RCA min irregs, RPLB1 90, EF 25-35%. No date: Chronic combined systolic and diastolic CHF (congestive  heart failure) (HCC)     Comment:  a. 07/2016 Echo: EF 30%, severe septal/anterior HK, Gr1               DD. No date: CKD (chronic kidney disease), stage III (HCC) No date: COPD (chronic obstructive pulmonary disease) (HCC) No date: DM2 (diabetes mellitus, type 2) (HCC) No date: Erectile dysfunction No date: HLD (hyperlipidemia) No date: HTN (hypertension) No date: Ischemic cardiomyopathy     Comment:  a. 07/2016 Echo: EF 30% w/ sev septal/ant HK. Gr1 DD. Past Surgical History: No date: CAD: stent to the LAD 10/01/2014: CARDIAC CATHETERIZATION 07/23/2016: CARDIAC CATHETERIZATION; N/A     Comment:  Procedure: Left Heart Cath and Coronary Angiography;                Surgeon: Iran Ouch, MD;  Location: [facility] INVASIVE               CV LAB;  Service: Cardiovascular;  Laterality: N/A; 10/01/2014: CORONARY ANGIOPLASTY WITH STENT PLACEMENT 10/31/2020: LOWER EXTREMITY ANGIOGRAPHY; Left     Comment:  Procedure: LOWER EXTREMITY ANGIOGRAPHY;  Surgeon: Annice Needy, MD;  Location: [facility] INVASIVE CV LAB;  Service:               Cardiovascular;  Laterality: Left; BMI    Body Mass Index: 33.52 kg/m     Reproductive/Obstetrics                           Anesthesia Physical Anesthesia Plan  ASA: IV  Anesthesia Plan: General and General ETT   Post-op Pain Management:    Induction: Intravenous  PONV Risk Score and Plan: Ondansetron and Treatment may vary due to age or medical condition  Airway Management Planned: Oral ETT  Additional Equipment:    Intra-op Plan:   Post-operative Plan: Extubation in OR  Informed Consent: I have reviewed the patients History and Physical, chart, labs and discussed the procedure including the risks, benefits and alternatives for the proposed anesthesia with the patient or authorized representative who has indicated his/her understanding and acceptance.     Dental Advisory Given  Plan Discussed with: CRNA  Anesthesia Plan Comments:        Anesthesia Quick Evaluation

## 2020-11-10 NOTE — Plan of Care (Signed)
  Problem: Education: Goal: Knowledge of General Education information will improve Description Including pain rating scale, medication(s)/side effects and non-pharmacologic comfort measures Outcome: Progressing   

## 2020-11-10 NOTE — Consult Note (Addendum)
Pharmacy Antibiotic Note  Danny DOUCET Sr. is a 70 y.o. male admitted on 11/06/2020 with increasing left foot pain, erythema, and leg edema. Patient with PMH significant for insulin-dependent DM, PAD, and gangrenous activity to the left foot. S/p revascularization 10/31/20.   Patient with BKA today per vascular Dr Wyn Quaker    Pharmacy has been consulted for vancomycin dosing.  Plan:  Continue Unasyn 3g q6h   Continue Vancomycin maintenance regimen of 1250 mg Q24H.   Goal AUC: 400-550  Expected AUC: 534   SCR used: 1.32  Follow up imaging cultures/source control plans  4th maintenance dose today  Vancomycin levels at steady state if warranted (current plan is to discontinue Vanc 24-48 hours after today's surgery, so may not be warranted)  Monitor renal function daily   Height: 5\' 8"  (172.7 cm) Weight: 100 kg (220 lb 7.4 oz) IBW/kg (Calculated) : 68.4  Temp (24hrs), Avg:98.5 F (36.9 C), Min:98.2 F (36.8 C), Max:98.7 F (37.1 C)  Recent Labs  Lab 11/06/20 1541 11/06/20 2007 11/07/20 0847 11/08/20 0435 11/09/20 0509 11/10/20 0507  WBC 24.4*  --   --  21.6*  --  19.1*  CREATININE 1.27*  --  1.13 1.11 1.13 1.32*  LATICACIDVEN 1.7 1.1  --   --   --   --     Estimated Creatinine Clearance: 60.5 mL/min (A) (by C-G formula based on SCr of 1.32 mg/dL (H)).    No Known Allergies  Antimicrobials this admission: 4/3 cefepime >> 4/6 4/3 vancomycin >>  4/3 metronidazole >> 4/6 4/6 unasyn >>  Dose adjustments this admission: n/a  Microbiology results: 4/3 BCx: [id number]   Thank you for allowing pharmacy to be a part of this patient's care.  6/3, PharmD, BCPS Clinical Pharmacist  11/10/2020 7:21 AM

## 2020-11-10 NOTE — Transfer of Care (Signed)
Immediate Anesthesia Transfer of Care Note  Patient: Danny Norman Blackburn Sr.  Procedure(s) Performed: AMPUTATION BELOW KNEE (Left Knee)  Patient Location: PACU  Anesthesia Type:General  Level of Consciousness: sedated  Airway & Oxygen Therapy: Patient Spontanous Breathing and Patient connected to face mask oxygen  Post-op Assessment: Report given to RN and Post -op Vital signs reviewed and stable  Post vital signs: Reviewed and stable  Last Vitals:  Vitals Value Taken Time  BP 115/66 11/10/20 1622  Temp 36.6 C 11/10/20 1622  Pulse 80 11/10/20 1623  Resp 18 11/10/20 1623  SpO2 99 % 11/10/20 1623  Vitals shown include unvalidated device data.  Last Pain:  Vitals:   11/10/20 1622  TempSrc: Temporal  PainSc: 0-No pain      Patients Stated Pain Goal: 0 (11/09/20 1650)  Complications: No complications documented.

## 2020-11-10 NOTE — Progress Notes (Signed)
Patient c/o pain to surgical leg.  Per Dr. Noralyn Pick no narcotics at this time due to administration of romazicon.  Patient alert but disoriented to time and situation.  Ok for patient to go to floor per dr Noralyn Pick.

## 2020-11-10 NOTE — Op Note (Signed)
OPERATIVE NOTE   PROCEDURE: Left below-the-knee amputation  PRE-OPERATIVE DIAGNOSIS: Left foot gangrene  POST-OPERATIVE DIAGNOSIS: same as above  SURGEON: Festus Barren, MD  ASSISTANT(S): Raul Del, PA-C  ANESTHESIA: general  ESTIMATED BLOOD LOSS: 100 cc  FINDING(S): none  SPECIMEN(S):  Left below-the-knee amputation  INDICATIONS:   Danny Scrape Sr. is a 70 y.o. male who presents with left leg gangrene.  The patient is scheduled for a left below-the-knee amputation.  I discussed in depth with the patient the risks, benefits, and alternatives to this procedure.  The patient is aware that the risk of this operation included but are not limited to:  bleeding, infection, myocardial infarction, stroke, death, failure to heal amputation wound, and possible need for more proximal amputation.  The patient is aware of the risks and agrees proceed forward with the procedure. An assistant was present during the procedure to help facilitate the exposure and expedite the procedure.  DESCRIPTION:  After full informed written consent was obtained from the patient, the patient was brought back to the operating room, and placed supine upon the operating table.  Prior to induction, the patient received IV antibiotics.  The patient was then prepped and draped in the standard fashion for a below-the-knee amputation. The assistant provided retraction and mobilization to help facilitate exposure and expedite the procedure throughout the entire procedure.  This included following suture, using retractors, and optimizing lighting. After obtaining adequate anesthesia, the patient was prepped and draped in the standard fashion for a left below-the-knee amputation.  I marked out the anterior incision two finger breadths below the tibial tuberosity and then the marked out a posterior flap that was one third of the circumference of the calf in length.   I made the incisions for these flaps, and then dissected  through the subcutaneous tissue, fascia, and muscle anteriorly.  I elevated  the periosteal tissue superiorly so that the tibia was about 3-4 cm shorter than the anterior skin flap.  I then transected the tibia with a power saw and then took a wedge off the tibia anteriorly with the power saw.  Then I smoothed out the rough edges.  In a similar fashion, I cut back the fibula about two centimeters higher than the level of the tibia with a bone cutter.  I put a bone hook into the distal tibia and then used a large amputation knife to sharply develop a tissue plane through the muscle along the fibula.  In such fashion, the posterior flap was developed.  At this point, the specimen was passed off the field as the below-the-knee amputation.  At this point, I clamped all visibly bleeding arteries and veins using a combination of suture ligation with Silk suture and electrocautery.  Bleeding continued to be controlled with electrocautery and suture ligature.  The stump was washed off with sterile normal saline and no further active bleeding was noted.  I reapproximated the anterior and posterior fascia  with interrupted stitches of 0 Vicryl.  This was completed along the entire length of anterior and posterior fascia until there were no more loose space in the fascial line. I then placed a layer of 2-0 Vicryl sutures in the subcutaneous tissue. The skin was then  reapproximated with staples.  The stump was washed off and dried.  The incision was dressed with Xeroform and  then fluffs were applied.  Kerlix was wrapped around the leg and then gently an ACE wrap was applied.    COMPLICATIONS: none  CONDITION: stable   Festus Barren  11/10/2020, 4:16 PM    This note was created with Dragon Medical transcription system. Any errors in dictation are purely unintentional.

## 2020-11-10 NOTE — Anesthesia Postprocedure Evaluation (Signed)
Anesthesia Post Note  Patient: Danny Jepsen Salas Sr.  Procedure(s) Performed: AMPUTATION BELOW KNEE (Left Knee)  Patient location during evaluation: PACU Anesthesia Type: General Level of consciousness: awake and alert and oriented Pain management: pain level controlled Vital Signs Assessment: post-procedure vital signs reviewed and stable Respiratory status: spontaneous breathing Cardiovascular status: blood pressure returned to baseline Anesthetic complications: no   No complications documented.   Last Vitals:  Vitals:   11/10/20 1624 11/10/20 1630  BP:  126/80  Pulse:  81  Resp:  19  Temp: 36.6 C   SpO2:  98%    Last Pain:  Vitals:   11/10/20 1624  TempSrc:   PainSc: Asleep                 Lynita Groseclose

## 2020-11-11 ENCOUNTER — Encounter: Payer: Self-pay | Admitting: Vascular Surgery

## 2020-11-11 DIAGNOSIS — A419 Sepsis, unspecified organism: Secondary | ICD-10-CM | POA: Diagnosis not present

## 2020-11-11 DIAGNOSIS — I5022 Chronic systolic (congestive) heart failure: Secondary | ICD-10-CM

## 2020-11-11 DIAGNOSIS — L03116 Cellulitis of left lower limb: Secondary | ICD-10-CM | POA: Diagnosis not present

## 2020-11-11 DIAGNOSIS — I5032 Chronic diastolic (congestive) heart failure: Secondary | ICD-10-CM | POA: Diagnosis not present

## 2020-11-11 LAB — CULTURE, BLOOD (ROUTINE X 2)
Culture: NO GROWTH
Culture: NO GROWTH
Special Requests: ADEQUATE
Special Requests: ADEQUATE

## 2020-11-11 LAB — GLUCOSE, CAPILLARY
Glucose-Capillary: 168 mg/dL — ABNORMAL HIGH (ref 70–99)
Glucose-Capillary: 123 mg/dL — ABNORMAL HIGH (ref 70–99)
Glucose-Capillary: 160 mg/dL — ABNORMAL HIGH (ref 70–99)
Glucose-Capillary: 131 mg/dL — ABNORMAL HIGH (ref 70–99)

## 2020-11-11 LAB — BASIC METABOLIC PANEL
Anion gap: 8 (ref 5–15)
BUN: 22 mg/dL (ref 8–23)
CO2: 21 mmol/L — ABNORMAL LOW (ref 22–32)
Calcium: 8.2 mg/dL — ABNORMAL LOW (ref 8.9–10.3)
Chloride: 108 mmol/L (ref 98–111)
Creatinine, Ser: 1.14 mg/dL (ref 0.61–1.24)
GFR, Estimated: >60 mL/min (ref >60)
Glucose, Bld: 148 mg/dL — ABNORMAL HIGH (ref 70–99)
Potassium: 3.9 mmol/L (ref 3.5–5.1)
Sodium: 137 mmol/L (ref 135–145)

## 2020-11-11 LAB — CBC WITH DIFFERENTIAL/PLATELET
Abs Immature Granulocytes: 0.33 10*3/uL — ABNORMAL HIGH (ref 0.00–0.07)
Basophils Absolute: 0.1 10*3/uL (ref 0.0–0.1)
Basophils Relative: 0 %
Eosinophils Absolute: 0.5 10*3/uL (ref 0.0–0.5)
Eosinophils Relative: 3 %
HCT: 37.7 % — ABNORMAL LOW (ref 39.0–52.0)
Hemoglobin: 12.2 g/dL — ABNORMAL LOW (ref 13.0–17.0)
Immature Granulocytes: 2 %
Lymphocytes Relative: 7 %
Lymphs Abs: 1.3 10*3/uL (ref 0.7–4.0)
MCH: 30 pg (ref 26.0–34.0)
MCHC: 32.4 g/dL (ref 30.0–36.0)
MCV: 92.6 fL (ref 80.0–100.0)
Monocytes Absolute: 1.7 10*3/uL — ABNORMAL HIGH (ref 0.1–1.0)
Monocytes Relative: 9 %
Neutro Abs: 14.3 10*3/uL — ABNORMAL HIGH (ref 1.7–7.7)
Neutrophils Relative %: 79 %
Platelets: 519 10*3/uL — ABNORMAL HIGH (ref 150–400)
RBC: 4.07 MIL/uL — ABNORMAL LOW (ref 4.22–5.81)
RDW: 13.1 % (ref 11.5–15.5)
WBC: 18 10*3/uL — ABNORMAL HIGH (ref 4.0–10.5)
nRBC: 0 % (ref 0.0–0.2)

## 2020-11-11 LAB — MAGNESIUM: Magnesium: 1.9 mg/dL (ref 1.7–2.4)

## 2020-11-11 MED ORDER — ENOXAPARIN SODIUM 40 MG/0.4ML ~~LOC~~ SOLN: 40.0000 mg | SUBCUTANEOUS | Status: DC

## 2020-11-11 MED ORDER — ENOXAPARIN SODIUM 60 MG/0.6ML ~~LOC~~ SOLN
0.5000 mg/kg | SUBCUTANEOUS | Status: DC
  Administered 2020-11-11 – 2020-11-15 (×5): 50 mg via SUBCUTANEOUS
  Filled 2020-11-11 (×5): qty 0.6

## 2020-11-11 MED ORDER — LOSARTAN POTASSIUM 50 MG PO TABS
50.0000 mg | ORAL_TABLET | Freq: Every day | ORAL | Status: DC
  Administered 2020-11-12: 50 mg via ORAL
  Filled 2020-11-11: qty 1

## 2020-11-11 MED ORDER — SPIRONOLACTONE 25 MG PO TABS
12.5000 mg | ORAL_TABLET | Freq: Every day | ORAL | Status: DC
  Administered 2020-11-11 – 2020-11-12 (×2): 12.5 mg via ORAL
  Filled 2020-11-11 (×2): qty 1
  Filled 2020-11-11 (×2): qty 0.5

## 2020-11-11 MED ORDER — LABETALOL HCL 5 MG/ML IV SOLN: 10.0000 mg | Freq: Four times a day (QID) | INTRAVENOUS | Status: DC | PRN

## 2020-11-11 MED ORDER — INSULIN GLARGINE 100 UNIT/ML ~~LOC~~ SOLN
25.0000 [IU] | Freq: Every day | SUBCUTANEOUS | Status: DC
  Administered 2020-11-11 – 2020-11-15 (×5): 25 [IU] via SUBCUTANEOUS
  Filled 2020-11-11 (×6): qty 0.25

## 2020-11-11 MED ORDER — LOSARTAN POTASSIUM 50 MG PO TABS
50.0000 mg | ORAL_TABLET | Freq: Once | ORAL | Status: AC
  Administered 2020-11-11: 50 mg via ORAL
  Filled 2020-11-11: qty 1

## 2020-11-11 MED ORDER — LISINOPRIL 20 MG PO TABS: 20.0000 mg | ORAL_TABLET | Freq: Every day | ORAL | Status: DC

## 2020-11-11 NOTE — Progress Notes (Addendum)
PROGRESS NOTE    Danny SNELGROVE Sr.  HYQ:[id number] DOB: May 17, 1951 DOA: 11/06/2020 PCP: [name], MD   Follow-up on left foot gangrene. Brief Narrative:  Patient admitted 11/06/2020 with clinical sepsis and gangrene of the left first toe and starting of the second toe and also gangrene going down the plantar surface of the foot. Patient had leukocytosis and tachycardia. Patient has been following with podiatry as outpatient and also had a recent angiogram by vascular surgery where they did 3 angioplasties and one stent. Patient seen in consultation by both podiatry and vascular surgery and he will be set up for a BKA on Thursday. Past medical history type 2 diabetes mellitus, hyperlipidemia, CAD, chronic diastolic CHF, COPD, essential hypertension and chronic kidney disease stage II Left lower extremity BKA on 4/7.   Assessment & Plan:   Active Problems:   Type 2 diabetes mellitus with hyperlipidemia (HCC)   Essential hypertension   Chronic diastolic CHF (congestive heart failure) (HCC)   Sepsis (HCC)   Gangrene of left foot (HCC)   Stage 2 chronic kidney disease   Thrush   Cellulitis of left foot   HFrEF (heart failure with reduced ejection fraction) (HCC)  #1. Sepsis.  Left foot cellulitis. POA  Left foot gangrene in the first, second and third toe.  Peripheral vascular disease. Status left BKA. Patient had surgery yesterday, currently doing well. Obtain PT/OT.  Most likely will need a nursing home placement. We will continue antibiotics for 48 hours post surgery.  2.  Essential hypertension. Continue Coreg 25 mg daily, increase lisinopril to 20 mg daily.  #3.  Uncontrolled type 2 diabetes with hyperglycemia. Reduce Lantus to 25 units daily.  Continue sliding scale insulin.  #4.  Chronic systolic congestive heart failure. EF 35-40%. Stable  5.  COPD.  Stable.  #6.  Reactive thrombocytosis. Continue to follow.   DVT prophylaxis: Lovenox Code Status:  Full Family Communication:  Disposition Plan:  .   Status is: Inpatient  Remains inpatient appropriate because:Inpatient level of care appropriate due to severity of illness   Dispo: The patient is from: Home              Anticipated d/c is to: SNF              Patient currently is not medically stable to d/c.   Difficult to place patient No        I/O last 3 completed shifts: In: 992.8 [P.O.:210; I.V.:782.8] Out: 2000 [Urine:2000] Total I/O In: 240 [P.O.:240] Out: 250 [Urine:250]     Consultants:   None  Procedures: Left BKA  Antimicrobials:  Unasyn and vancomycin.  Subjective: Patient doing well today, he slept well last night.  He has mild short of breath with exertion which is baseline. Denies any abdominal pain or nausea vomiting. No fever chills No dysuria hematuria.  Objective: Vitals:   11/11/20 0452 11/11/20 0713 11/11/20 0820 11/11/20 1149  BP: (!) 196/108 (!) 192/108 (!) 161/79 (!) 146/95  Pulse: (!) 108 (!) 104 (!) 103 (!) 102  Resp: 20 20  20   Temp: 98.8 F (37.1 C) 98.8 F (37.1 C)  98.4 F (36.9 C)  TempSrc: Oral Oral  Oral  SpO2: 95% 92%  93%  Weight: 100.9 kg     Height:        Intake/Output Summary (Last 24 hours) at 11/11/2020 1312 Last data filed at 11/11/2020 1015 Gross per 24 hour  Intake 1232.8 ml  Output 1050 ml  Net  182.8 ml   Filed Weights   11/06/20 1536 11/11/20 0452  Weight: 100 kg 100.9 kg    Examination:  General exam: Appears calm and comfortable  Respiratory system: Decreased breathing sounds. Respiratory effort normal. Cardiovascular system: S1 & S2 heard, RRR. No JVD, murmurs, rubs, gallops or clicks. No pedal edema. Gastrointestinal system: Abdomen is nondistended, soft and nontender. No organomegaly or masses felt. Normal bowel sounds heard. Central nervous system: Alert and oriented. No focal neurological deficits. Extremities: Left BKA. Skin: No rashes, lesions or ulcers Psychiatry: Judgement and  insight appear normal. Mood & affect appropriate.     Data Reviewed: I have personally reviewed following labs and imaging studies  CBC: Recent Labs  Lab 11/06/20 1541 11/08/20 0435 11/10/20 0507 11/11/20 0640  WBC 24.4* 21.6* 19.1* 18.0*  NEUTROABS 20.6*  --   --  14.3*  HGB 13.5 12.3* 12.6* 12.2*  HCT 42.3 37.8* 40.1 37.7*  MCV 93.2 92.9 93.3 92.6  PLT 407* 378 479* 519*   Basic Metabolic Panel: Recent Labs  Lab 11/06/20 1541 11/07/20 0847 11/08/20 0435 11/09/20 0509 11/10/20 0507 11/11/20 0640  NA 135  --  135 136 137 137  K 3.9  --  3.7 3.9 4.3 3.9  CL 102  --  106 107 107 108  CO2 21*  --  21* 22 23 21*  GLUCOSE 212*  --  103* 114* 133* 148*  BUN 23  --  23 26* 29* 22  CREATININE 1.27* 1.13 1.11 1.13 1.32* 1.14  CALCIUM 8.6*  --  8.3* 8.5* 8.5* 8.2*  MG  --   --   --   --  2.1 1.9   GFR: Estimated Creatinine Clearance: 70.4 mL/min (by C-G formula based on SCr of 1.14 mg/dL). Liver Function Tests: Recent Labs  Lab 11/06/20 1541 11/09/20 0509  AST 74* 46*  ALT 99* 60*  ALKPHOS 129* 120  BILITOT 0.8 0.8  PROT 7.0 6.5  ALBUMIN 3.0* 2.4*   No results for input(s): LIPASE, AMYLASE in the last 168 hours. No results for input(s): AMMONIA in the last 168 hours. Coagulation Profile: Recent Labs  Lab 11/10/20 0507  INR 1.3*   Cardiac Enzymes: No results for input(s): CKTOTAL, CKMB, CKMBINDEX, TROPONINI in the last 168 hours. BNP (last 3 results) No results for input(s): PROBNP in the last 8760 hours. HbA1C: No results for input(s): HGBA1C in the last 72 hours. CBG: Recent Labs  Lab 11/10/20 1632 11/10/20 1805 11/10/20 2038 11/11/20 0831 11/11/20 1138  GLUCAP 109* 121* 161* 168* 123*   Lipid Profile: No results for input(s): CHOL, HDL, LDLCALC, TRIG, CHOLHDL, LDLDIRECT in the last 72 hours. Thyroid Function Tests: No results for input(s): TSH, T4TOTAL, FREET4, T3FREE, THYROIDAB in the last 72 hours. Anemia Panel: No results for input(s):  VITAMINB12, FOLATE, FERRITIN, TIBC, IRON, RETICCTPCT in the last 72 hours. Sepsis Labs: Recent Labs  Lab 11/06/20 1541 11/06/20 2007  LATICACIDVEN 1.7 1.1    Recent Results (from the past 240 hour(s))  Culture, blood (routine x 2)     Status: None   Collection Time: 11/06/20  3:14 PM   Specimen: BLOOD  Result Value Ref Range Status   Specimen Description BLOOD  RT [facility]  Final   Special Requests   Final    BOTTLES DRAWN AEROBIC AND ANAEROBIC Blood Culture adequate volume   Culture   Final    NO GROWTH 5 DAYS Performed at [facility], [address] [place], [place] [address]  Report Status 11/11/2020 FINAL  Final  Culture, blood (routine x 2)     Status: None   Collection Time: 11/06/20  3:41 PM   Specimen: BLOOD  Result Value Ref Range Status   Specimen Description BLOOD  RT [facility]  Final   Special Requests   Final    BOTTLES DRAWN AEROBIC AND ANAEROBIC Blood Culture adequate volume   Culture   Final    NO GROWTH 5 DAYS Performed at [facility], [address] [place], [place] [address]    Report Status 11/11/2020 FINAL  Final  SARS CORONAVIRUS 2 (TAT 6-24 HRS) Nasopharyngeal Nasopharyngeal Swab     Status: None   Collection Time: 11/06/20 11:35 PM   Specimen: Nasopharyngeal Swab  Result Value Ref Range Status   SARS Coronavirus 2 NEGATIVE NEGATIVE Final    Comment: (NOTE) SARS-CoV-2 target nucleic acids are NOT DETECTED.  The SARS-CoV-2 RNA is generally detectable in upper and lower respiratory specimens during the acute phase of infection. Negative results do not preclude SARS-CoV-2 infection, do not rule out co-infections with other pathogens, and should not be used as the sole basis for treatment or other patient management decisions. Negative results must be combined with clinical observations, patient history, and epidemiological information. The expected result is Negative.  Fact Sheet for  Patients: HairSlick.no  Fact Sheet for Healthcare Providers: quierodirigir.com  This test is not yet approved or cleared by the [place] FDA and  has been authorized for detection and/or diagnosis of SARS-CoV-2 by FDA under an Emergency Use Authorization (EUA). This EUA will remain  in effect (meaning this test can be used) for the duration of the COVID-19 declaration under Se ction 564(b)(1) of the Act, 21 U.S.C. section 360bbb-3(b)(1), unless the authorization is terminated or revoked sooner.  Performed at [facility], [address], [place], [place] [address]          Radiology Studies: No results found.      Scheduled Meds: . aspirin EC  81 mg Oral Daily  . atorvastatin  80 mg Oral QHS  . carvedilol  25 mg Oral BID WC  . escitalopram  10 mg Oral Daily  . ezetimibe  10 mg Oral Daily  . furosemide  20 mg Oral Daily  . insulin aspart  0-15 Units Subcutaneous TID WC  . insulin aspart  0-5 Units Subcutaneous QHS  . insulin glargine  28 Units Subcutaneous Daily  . isosorbide mononitrate  30 mg Oral Daily  . lisinopril  5 mg Oral Daily  . potassium chloride  10 mEq Oral Daily  . senna-docusate  2 tablet Oral BID  . tamsulosin  0.4 mg Oral Daily   Continuous Infusions: . ampicillin-sulbactam (UNASYN) IV 3 g (11/11/20 0830)  . vancomycin 1,250 mg (11/10/20 2124)     LOS: 5 days    Time spent: 28 minutes    Marrion Coy, MD [facility]   To contact the attending provider between 7A-7P or the covering provider during after hours 7P-7A, please log into the web site [email or web address] and access using universal [place] password for that web site. If you do not have the password, please call the hospital operator.  11/11/2020, 1:12 PM

## 2020-11-11 NOTE — Evaluation (Signed)
Physical Therapy Evaluation Patient Details Name: Danny HOSIE Sr. MRN: [id number] DOB: 22-Jan-1951 Today's Date: 11/11/2020   History of Present Illness  Danny [name]  is a 70 y.o. male with a known history of CAD, CHF, COPD, type 2 diabetes with hyperlipidemia came to the ER with worsening pain in his foot.  He has been followed as outpatient by Dr. Alberteen Spindle podiatry seen first on 10/21/2020.  He had gangrene of the toe at that time.  He was referred to vascular surgery for an angiogram.  On 10/31/2000 Dr. Wyn Quaker did an angiogram and had 3 angioplasties and 1 stent placed.  With patient having worsening foot pain he came back to the hospital.  Pain 7 out of 10 in intensity.  He has been having trouble thinking straight.  His white blood cell count was elevated and he was tachycardic and still has gangrene.  Hospitalist services were contacted for admission for sepsis. Pt underwent L BKA on 11/10/20. PMH: CAD, chronic combined systolic & diastolic CHF, CKD stage 3, COPD, DM2, HLD, HTN, ischemic cardiomyopathy    Clinical Impression  Pt seen for PT evaluation with pt's sister present during session. Pt is confused, only oriented to self, unable to provide accurate home set up information (sister assists), and reports hallucinations during session - nurse aware. Pt requires min assist supine>sit, CGA sit>supine with use of hospital bed features & extra time to complete movement. Pt endorses dizziness once sitting EOB, BP noted below. Encouraged pt to attempt scooting to initiate A>P transfer but after sitting EOB ~3-4 minutes pt endorses worsening dizziness so performs lateral scoots to R with mod assist & max cuing as pt internally & externally distracted with pt requiring step by step instructions for sequencing movement. PT educated pt on phantom sensation & phantom pain, and desensitization techniques. Also educated pt on importance of L knee extension, no pillows under L knee and attempted to assist pt with L  knee extension but pt unable to achieve 0 degrees. Pt left in bed with MD in room. Pt would benefit from STR upon d/c to maximize independence with functional mobility & reduce fall risk prior to return home.     Follow Up Recommendations SNF    Equipment Recommendations  None recommended by PT (TBD in next venue)    Recommendations for Other Services       Precautions / Restrictions Precautions Precautions: Fall Restrictions Weight Bearing Restrictions: Yes LLE Weight Bearing: Non weight bearing Other Position/Activity Restrictions: BKA on 11/10/20      Mobility  Bed Mobility Overal bed mobility: Needs Assistance Bed Mobility: Supine to Sit;Sit to Supine     Supine to sit: Min assist;HOB elevated Sit to supine: Min guard;HOB elevated   General bed mobility comments: use of bed rails, extra time to complete movement    Transfers                    Ambulation/Gait                Stairs            Wheelchair Mobility    Modified Rankin (Stroke Patients Only)       Balance Overall balance assessment: Needs assistance Sitting-balance support: Bilateral upper extremity supported (RLE supported) Sitting balance-[name]: Fair Sitting balance - Comments: CGA sitting EOB  Pertinent Vitals/Pain Pain Assessment: Faces Faces Pain Scale: Hurts whole lot Pain Location: L residual limb Pain Descriptors / Indicators: Discomfort;Sore Pain Intervention(s): Limited activity within patient's tolerance;Monitored during session;RN gave pain meds during session    Home Living Family/patient expects to be discharged to:: Private residence Living Arrangements: Children Available Help at Discharge: Available PRN/intermittently;Family (lives with son who works out of the home during the day) Type of Home: House Home Access: Stairs to enter Entrance Stairs-Rails: Acupuncturist of Steps:  2 Home Layout: One level Home Equipment: None      Prior Function Level of Independence: Independent         Comments: without AD     Hand Dominance        Extremity/Trunk Assessment   Upper Extremity Assessment Upper Extremity Assessment: Generalized weakness    Lower Extremity Assessment Lower Extremity Assessment: LLE deficits/detail LLE Deficits / Details: unable to fully extend L knee 2/2 pain with AROM, doesn't tolerate PROM, limited significantly by pain       Communication      Cognition Arousal/Alertness: Lethargic   Overall Cognitive Status: Impaired/Different from baseline Area of Impairment: Orientation;Memory;Attention;Following commands;Safety/judgement;Problem solving;Awareness                 Orientation Level: Disoriented to;Place;Time;Situation (reports it's March 2002)   Memory: Decreased short-term memory;Decreased recall of precautions Following Commands: Follows one step commands inconsistently;Follows one step commands with increased time Safety/Judgement: Decreased awareness of deficits;Decreased awareness of safety Awareness: Emergent;Anticipatory;Intellectual (all impaired) Problem Solving: Slow processing;Decreased initiation;Requires verbal cues;Requires tactile cues General Comments: Pt's sister reports pt has been hallucinating & pt talks of seeing someone in the glass when pointing to the wall      General Comments General comments (skin integrity, edema, etc.): Pt c/o dizziness upon sitting, BP 171/85 mmHg (MAP 119), HR 105 bpm    Exercises     Assessment/Plan    PT Assessment Patient needs continued PT services  PT Problem List Decreased strength;Decreased mobility;Decreased safety awareness;Decreased knowledge of precautions;Decreased range of motion;Decreased activity tolerance;Decreased cognition;Cardiopulmonary status limiting activity;Pain;Decreased skin integrity;Decreased balance;Decreased knowledge of use of  DME;Impaired sensation       PT Treatment Interventions DME instruction;Therapeutic activities;Cognitive remediation;Modalities;Gait training;Therapeutic exercise;Patient/family education;Stair training;Balance training;Wheelchair mobility training;Functional mobility training;Neuromuscular re-education;Manual techniques    PT Goals (Current goals can be found in the Care Plan section)  Acute Rehab PT Goals Patient Stated Goal: less pain PT Goal Formulation: With patient Time For Goal Achievement: 11/25/20 Potential to Achieve Goals: Fair    Frequency 7X/week   Barriers to discharge Decreased caregiver support;Inaccessible home environment      Co-evaluation               AM-PAC PT "6 Clicks" Mobility  Outcome Measure Help needed turning from your back to your side while in a flat bed without using bedrails?: A Little Help needed moving from lying on your back to sitting on the side of a flat bed without using bedrails?: A Lot Help needed moving to and from a bed to a chair (including a wheelchair)?: Total Help needed standing up from a chair using your arms (e.g., wheelchair or bedside chair)?: Total Help needed to walk in hospital room?: Total Help needed climbing 3-5 steps with a railing? : Total 6 Click Score: 9    End of Session   Activity Tolerance: Patient limited by pain (limited 2/2 dizziness while sitting EOB) Patient left: in bed;with call bell/phone within reach;with bed alarm set;with  family/visitor present (MD in room) Nurse Communication: Mobility status (impaired cognition) PT Visit Diagnosis: Difficulty in walking, not elsewhere classified (R26.2);Muscle weakness (generalized) (M62.81);Pain Pain - Right/Left: Left Pain - part of body: Leg    Time: 1441-1510 PT Time Calculation (min) (ACUTE ONLY): 29 min   Charges:   PT Evaluation $PT Eval Low Complexity: 1 Low PT Treatments $Therapeutic Activity: 8-22 mins        Aleda Grana, PT,  DPT 11/11/20, 3:26 PM   Sandi Mariscal 11/11/2020, 3:22 PM

## 2020-11-11 NOTE — [place] FL2 (Signed)
[place] MEDICAID FL2 LEVEL OF CARE SCREENING TOOL     IDENTIFICATION  Patient Name: Danny [name]. Birthdate: 04/18/1951 Sex: male Admission Date (Current Location): 11/06/2020  [place] and [place] Number:  Chiropodist and Address:  [facility], [address] [place], [place] [address]      Provider Number: [id number]  Attending Physician Name and Address:  Marrion Coy, MD  Relative Name and Phone Number:  Hence bell, cai (Son)   [phone] [facility])    Current Level of Care: Hospital Recommended Level of Care: Skilled Nursing Facility Prior Approval Number:    Date Approved/Denied:   PASRR Number: [id number]  Discharge Plan: SNF    Current Diagnoses: Patient Active Problem List   Diagnosis Date Noted  . Cellulitis of left foot 11/09/2020  . HFrEF (heart failure with reduced ejection fraction) (HCC)   . Thrush   . Sepsis (HCC) 11/06/2020  . Gangrene of left foot (HCC)   . PVD (peripheral vascular disease) (HCC)   . Stage 2 chronic kidney disease   . Atherosclerosis of native arteries of the extremities with gangrene (HCC) 10/28/2020  . Low back pain 06/29/2020  . Onychomycosis 03/29/2020  . Fall 03/29/2020  . AKI (acute kidney injury) (HCC) 12/28/2019  . Coronary artery disease of native artery of native heart with stable angina pectoris (HCC) 12/12/2018  . Pure hypercholesterolemia 12/12/2018  . Chest pain 10/31/2018  . Atherosclerosis of native arteries of extremity with intermittent claudication (HCC) 02/11/2018  . Decreased pedal pulses 07/02/2017  . Orthostasis 05/07/2017  . Anxiety and depression 05/07/2017  . CKD (chronic kidney disease) stage 3, GFR 30-59 ml/min (HCC) 03/06/2017  . Diabetic neuropathy (HCC) 03/06/2017  . Chronic diastolic CHF (congestive heart failure) (HCC) 03/05/2017  . Morbid obesity (HCC) 06/18/2016  . H/O medication noncompliance 06/18/2016  . COPD (chronic obstructive pulmonary  disease) (HCC) 06/20/2015  . GERD (gastroesophageal reflux disease) 12/20/2014  . Type 2 diabetes mellitus with hyperlipidemia (HCC) 06/05/2010  . Hyperlipidemia 02/10/2010  . Essential hypertension 02/10/2010  . CAD (coronary artery disease) 02/10/2010    Orientation RESPIRATION BLADDER Height & Weight     Self,Time,Situation,Place  Normal Continent Weight: 222 lb 8 oz (100.9 kg) Height:  5\' 8"  (172.7 cm)  BEHAVIORAL SYMPTOMS/MOOD NEUROLOGICAL BOWEL NUTRITION STATUS      Continent Diet (heart healthy/carb modified, thin liquids)  AMBULATORY STATUS COMMUNICATION OF NEEDS Skin   Extensive Assist Verbally Surgical wounds (L leg)                       Personal Care Assistance Level of Assistance  Bathing,Feeding,Dressing Bathing Assistance: Maximum assistance Feeding assistance: Limited assistance Dressing Assistance: Maximum assistance     Functional Limitations Info             SPECIAL CARE FACTORS FREQUENCY  PT (By licensed PT),OT (By licensed OT)     PT Frequency: 5 x/week OT Frequency: 5 x/week            Contractures      Additional Factors Info  Code Status,Allergies Code Status Info: full code Allergies Info: nka           Current Medications (11/11/2020):  This is the current hospital active medication list Current Facility-Administered Medications  Medication Dose Route Frequency Provider Last Rate Last Admin  . acetaminophen (TYLENOL) tablet 650 mg  650 mg Oral Q6H PRN 01/11/2021, MD   650 mg at 11/10/20 1950  Or  . acetaminophen (TYLENOL) suppository 650 mg  650 mg Rectal Q6H PRN Annice Needy, MD      . Ampicillin-Sulbactam (UNASYN) 3 g in sodium chloride 0.9 % 100 mL IVPB  3 g Intravenous Q6H Marrion Coy, MD 200 mL/hr at 11/11/20 1547 3 g at 11/11/20 1547  . aspirin EC tablet 81 mg  81 mg Oral Daily Annice Needy, MD   81 mg at 11/11/20 0729  . atorvastatin (LIPITOR) tablet 80 mg  80 mg Oral QHS Annice Needy, MD   80 mg at 11/10/20 2020   . baclofen (LIORESAL) tablet 10 mg  10 mg Oral TID PRN Annice Needy, MD   10 mg at 11/11/20 0235  . carvedilol (COREG) tablet 25 mg  25 mg Oral BID WC Annice Needy, MD   25 mg at 11/11/20 0729  . enoxaparin (LOVENOX) injection 50 mg  0.5 mg/kg Subcutaneous Q24H Marrion Coy, MD      . escitalopram (LEXAPRO) tablet 10 mg  10 mg Oral Daily Annice Needy, MD   10 mg at 11/11/20 0729  . ezetimibe (ZETIA) tablet 10 mg  10 mg Oral Daily Annice Needy, MD   10 mg at 11/11/20 0729  . furosemide (LASIX) tablet 20 mg  20 mg Oral Daily Annice Needy, MD   20 mg at 11/11/20 0729  . insulin aspart (novoLOG) injection 0-15 Units  0-15 Units Subcutaneous TID WC Annice Needy, MD   2 Units at 11/11/20 1148  . insulin aspart (novoLOG) injection 0-5 Units  0-5 Units Subcutaneous QHS Annice Needy, MD      . insulin glargine (LANTUS) injection 25 Units  25 Units Subcutaneous Daily Zhang, Dekui, MD      . ipratropium-albuterol (DUONEB) 0.5-2.5 (3) MG/3ML nebulizer solution 3 mL  3 mL Nebulization Q6H PRN Annice Needy, MD   3 mL at 11/10/20 0753  . isosorbide mononitrate (IMDUR) 24 hr tablet 30 mg  30 mg Oral Daily Annice Needy, MD   30 mg at 11/11/20 0729  . [START ON 11/12/2020] losartan (COZAAR) tablet 50 mg  50 mg Oral Daily Creig Hines, NP      . losartan (COZAAR) tablet 50 mg  50 mg Oral Once Marrion Coy, MD      . morphine 2 MG/ML injection 2 mg  2 mg Intravenous Q2H PRN Annice Needy, MD   2 mg at 11/11/20 0236  . nitroGLYCERIN (NITROSTAT) SL tablet 0.4 mg  0.4 mg Sublingual Q5 min PRN Annice Needy, MD      . ondansetron [facility]) tablet 4 mg  4 mg Oral Q6H PRN Annice Needy, MD   4 mg at 11/09/20 2120   Or  . ondansetron (ZOFRAN) injection 4 mg  4 mg Intravenous Q6H PRN Annice Needy, MD   4 mg at 11/09/20 1150  . oxyCODONE-acetaminophen (PERCOCET/ROXICET) 5-325 MG per tablet 1-2 tablet  1-2 tablet Oral Q6H PRN Annice Needy, MD   1 tablet at 11/11/20 1458  . potassium chloride (KLOR-CON) CR tablet 10  mEq  10 mEq Oral Daily Annice Needy, MD   10 mEq at 11/11/20 0729  . senna-docusate (Senokot-S) tablet 2 tablet  2 tablet Oral BID Annice Needy, MD   2 tablet at 11/11/20 0729  . spironolactone (ALDACTONE) tablet 12.5 mg  12.5 mg Oral Daily Creig Hines, NP   12.5 mg at 11/11/20 1543  . tamsulosin (  FLOMAX) capsule 0.4 mg  0.4 mg Oral Daily Annice Needy, MD   0.4 mg at 11/11/20 0729  . vancomycin (VANCOREADY) IVPB 1250 mg/250 mL  1,250 mg Intravenous Q24H Marrion Coy, MD 166.7 mL/hr at 11/10/20 2124 1,250 mg at 11/10/20 2124     Discharge Medications: Please see discharge summary for a list of discharge medications.  Relevant Imaging Results:  Relevant Lab Results:   Additional Information SS #: [id number]  Quinley Nesler E Haile Bosler, LCSW

## 2020-11-11 NOTE — Progress Notes (Signed)
[facility] Daily Progress Note   Subjective: Left below-the-knee amputation  Patient with some left lower extremity stump discomfort however this seemed to be controlled with his current pain regimen.  No issues overnight.  Objective: Vitals:   11/11/20 0452 11/11/20 0713 11/11/20 0820 11/11/20 1149  BP: (!) 196/108 (!) 192/108 (!) 161/79 (!) 146/95  Pulse: (!) 108 (!) 104 (!) 103 (!) 102  Resp: 20 20  20   Temp: 98.8 F (37.1 C) 98.8 F (37.1 C)  98.4 F (36.9 C)  TempSrc: Oral Oral  Oral  SpO2: 95% 92%  93%  Weight: 100.9 kg     Height:        Intake/Output Summary (Last 24 hours) at 11/11/2020 1228 Last data filed at 11/11/2020 1015 Gross per 24 hour  Intake 1232.8 ml  Output 1050 ml  Net 182.8 ml   Physical Exam: A&Ox3, NAD CV: RRR Pulmonary: CTA Bilaterally Abdomen: Soft, Nontender, Nondistended Vascular:  Left lower extremity: Thigh soft.  Knee is flexible at the joint.  OR dressing is clean dry and intact.   Laboratory: CBC    Component Value Date/Time   WBC 18.0 (H) 11/11/2020 0640   HGB 12.2 (L) 11/11/2020 0640   HGB 15.1 11/23/2014 2006   HCT 37.7 (L) 11/11/2020 0640   HCT 46.1 11/23/2014 2006   PLT 519 (H) 11/11/2020 0640   PLT 229 11/23/2014 2006   BMET    Component Value Date/Time   NA 137 11/11/2020 0640   NA 135 11/23/2014 2006   K 3.9 11/11/2020 0640   K 4.0 11/23/2014 2006   CL 108 11/11/2020 0640   CL 100 (L) 11/23/2014 2006   CO2 21 (L) 11/11/2020 0640   CO2 24 11/23/2014 2006   GLUCOSE 148 (H) 11/11/2020 0640   GLUCOSE 370 (H) 11/23/2014 2006   BUN 22 11/11/2020 0640   BUN 40 (H) 11/23/2014 2006   CREATININE 1.14 11/11/2020 0640   CREATININE 2.16 (H) 11/23/2014 2006   CALCIUM 8.2 (L) 11/11/2020 0640   CALCIUM 8.6 (L) 11/23/2014 2006   GFRNONAA >60 11/11/2020 0640   GFRNONAA 31 (L) 11/23/2014 2006   GFRAA 37 (L) 12/24/2019 1608   GFRAA 36 (L) 11/23/2014 2006   Assessment/Planning: The patient is a  70 year old male who presented with progressively worsening gangrene to the left foot.  Podiatry was consulted who felt the patient's foot was not salvageable so the patient underwent a below the knee amputation - POD#1  1) hemoglobin is stable. 2) pain regimen seems to be controlling the patient's discomfort 3) patient to start to work with OT/PT 4) we will plan on removing operating room dressing on Sunday  Discussed with Dr. Sunday Shalom Ware PA-C 11/11/2020 12:28 PM

## 2020-11-11 NOTE — Progress Notes (Signed)
Progress Note  Patient Name: Danny MEHRA Sr. Date of Encounter: 11/11/2020  Primary Cardiologist: Julien Nordmann, MD  Subjective   No chest pain or sob.  Ongoing left leg pain. Pain meds have caused some delirium, though he is cooperative.  Inpatient Medications    Scheduled Meds: . aspirin EC  81 mg Oral Daily  . atorvastatin  80 mg Oral QHS  . carvedilol  25 mg Oral BID WC  . enoxaparin (LOVENOX) injection  0.5 mg/kg Subcutaneous Q24H  . escitalopram  10 mg Oral Daily  . ezetimibe  10 mg Oral Daily  . furosemide  20 mg Oral Daily  . insulin aspart  0-15 Units Subcutaneous TID WC  . insulin aspart  0-5 Units Subcutaneous QHS  . insulin glargine  25 Units Subcutaneous Daily  . isosorbide mononitrate  30 mg Oral Daily  . [START ON 11/12/2020] lisinopril  20 mg Oral Daily  . potassium chloride  10 mEq Oral Daily  . senna-docusate  2 tablet Oral BID  . tamsulosin  0.4 mg Oral Daily   Continuous Infusions: . ampicillin-sulbactam (UNASYN) IV 3 g (11/11/20 0830)  . vancomycin 1,250 mg (11/10/20 2124)   PRN Meds: acetaminophen **OR** acetaminophen, baclofen, ipratropium-albuterol, morphine injection, nitroGLYCERIN, ondansetron **OR** ondansetron (ZOFRAN) IV, oxyCODONE-acetaminophen   Vital Signs    Vitals:   11/11/20 0452 11/11/20 0713 11/11/20 0820 11/11/20 1149  BP: (!) 196/108 (!) 192/108 (!) 161/79 (!) 146/95  Pulse: (!) 108 (!) 104 (!) 103 (!) 102  Resp: 20 20  20   Temp: 98.8 F (37.1 C) 98.8 F (37.1 C)  98.4 F (36.9 C)  TempSrc: Oral Oral  Oral  SpO2: 95% 92%  93%  Weight: 100.9 kg     Height:        Intake/Output Summary (Last 24 hours) at 11/11/2020 1427 Last data filed at 11/11/2020 1355 Gross per 24 hour  Intake 1472.8 ml  Output 1050 ml  Net 422.8 ml   Filed Weights   11/06/20 1536 11/11/20 0452  Weight: 100 kg 100.9 kg    Physical Exam   GEN: Well nourished, well developed, in no acute distress.  HEENT: Grossly normal.  Neck: Supple, no JVD,  carotid bruits, or masses. Cardiac: RRR, no murmurs, rubs, or gallops. No clubbing, cyanosis, edema.  Radials 2+, L BKA. Respiratory:  Respirations regular and unlabored, clear to auscultation bilaterally. GI: Soft, nontender, nondistended, BS + x 4. MS: no deformity or atrophy. Skin: warm and dry, no rash. Neuro:  Strength and sensation are intact. Psych: AAOx3.  Normal affect.  Labs    Chemistry Recent Labs  Lab 11/06/20 1541 11/07/20 0847 11/09/20 0509 11/10/20 0507 11/11/20 0640  NA 135   < > 136 137 137  K 3.9   < > 3.9 4.3 3.9  CL 102   < > 107 107 108  CO2 21*   < > 22 23 21*  GLUCOSE 212*   < > 114* 133* 148*  BUN 23   < > 26* 29* 22  CREATININE 1.27*   < > 1.13 1.32* 1.14  CALCIUM 8.6*   < > 8.5* 8.5* 8.2*  PROT 7.0  --  6.5  --   --   ALBUMIN 3.0*  --  2.4*  --   --   AST 74*  --  46*  --   --   ALT 99*  --  60*  --   --   ALKPHOS 129*  --  120  --   --  BILITOT 0.8  --  0.8  --   --   GFRNONAA >60   < > >60 58* >60  ANIONGAP 12   < > 7 7 8    < > = values in this interval not displayed.     Hematology Recent Labs  Lab 11/08/20 0435 11/10/20 0507 11/11/20 0640  WBC 21.6* 19.1* 18.0*  RBC 4.07* 4.30 4.07*  HGB 12.3* 12.6* 12.2*  HCT 37.8* 40.1 37.7*  MCV 92.9 93.3 92.6  MCH 30.2 29.3 30.0  MCHC 32.5 31.4 32.4  RDW 12.7 13.0 13.1  PLT 378 479* 519*    Lipids  Lab Results  Component Value Date   CHOL 96 10/26/2020   HDL 46 10/26/2020   LDLCALC 38 10/26/2020   LDLDIRECT 64.0 03/20/2018   TRIG 60 10/26/2020   CHOLHDL 2.1 10/26/2020    HbA1c  Lab Results  Component Value Date   HGBA1C 8.4 (H) 09/30/2020    Radiology    DG Chest Port 1 View  Result Date: 11/07/2020 CLINICAL DATA:  Short of breath EXAM: PORTABLE CHEST 1 VIEW COMPARISON:  11/06/2020 FINDINGS: Streaky densities in the right lung base compatible with scarring unchanged from prior studies. Chronic elevation right hemidiaphragm unchanged Left lung is clear. Negative for heart  failure or pneumonia. No pleural effusion. Apical emphysema. IMPRESSION: No acute abnormality. Scarring in the right lung base. Apical emphysema. Electronically Signed   By: 01/06/2021 M.D.   On: 11/07/2020 17:01   Telemetry    Not on tele  Cardiac Studies   2D Echocardiogram 4.5.2022  1. Left ventricular ejection fraction, by estimation, is 35 to 40%. The  left ventricle has moderately decreased function. The left ventricle  demonstrates global hypokinesis. The left ventricular internal cavity size  was mildly dilated. Left ventricular  diastolic parameters are consistent with Grade I diastolic dysfunction  (impaired relaxation).  2. Right ventricular systolic function is normal. The right ventricular  size is normal. Tricuspid regurgitation signal is inadequate for assessing  PA pressure.  3. The mitral valve is grossly normal. Trivial mitral valve  regurgitation. No evidence of mitral stenosis.  4. The aortic valve was not well visualized. Aortic valve regurgitation  is not visualized. No aortic stenosis is present.  5. The inferior vena cava is normal in size with <50% respiratory  variability, suggesting right atrial pressure of 8 mmHg.   Patient Profile     69 y.o. male with a hx of CAD s/p LAD stenting, ICM, chronic combined CHF, stage 3 CKD, PAD, COPD, remote tobacco use, DM2, HTN, HLD, ED, and obesity, who was admitted 4/3 w/ worsening L foot ischemia/gangrene, now s/p L BKA.  Assessment & Plan    1.  L LE ischemia/gangrene: s/p L BKA.  Pain mgmt per vasc surgery.  2.  Sepsis/Leukocytosis:  abx per IM.  Hemodynamically stable.  3.  HFrEF/ICM:  He has prior h/o ICM/HFrEF w/ EF previously 30% in 07/2016 followed by improvement on echo 03/2017 (55-60%).  Echo performed preoperatively this admission w/ finding of recurrent LV dysfxn and EF of 35-40%, Gr1 diast dysfxn, and global HK.  Euvolemic on exam.  No dyspnea.  BPs have been up.  I see plan to titrate lisinopril  for tomorrow.  Given LV dysfxn, I will switch to losartan w/ eye toward entresto on Sunday.  Will also add spiro 12.5 daily.  Consider SGLT2i next.  Cont  blocker, lasix.  May be able to d/c KCl after adding spiro.  4.  Essential HTN:  BPs up post-op.  As above, transitioning to losartan and titrating.  Can hopefully change to entresto on Sunday.  Adding spiro today.  Cont coreg, nitrate, lasix.  5.  CAD:  S/p prior PCI  LAD.  No chest pain or dyspnea.  With recurrent LV dysfxn, will need repeat ischemic eval.  Can be performed as outpt following recovery from #1.  Cont asa, statin,  blocker, nitrate.  6.  HL:  Cont statin rx.  LDL 38.  7.  CKD III:  Creat has been stable.  Follow.  8.  DMII:  A1c 8.4 in Feb.  Per IM.  Consider SGLT2i.  Signed, Nicolasa Ducking, NP  11/11/2020, 2:27 PM    For questions or updates, please contact   Please consult [email or web address] for contact info under Cardiology/STEMI.

## 2020-11-11 NOTE — Evaluation (Signed)
Occupational Therapy Evaluation Patient Details Name: Danny [name] Sr. MRN: [id number] DOB: Aug 19, 1950 Today's Date: 11/11/2020    History of Present Illness Danny [name]  is a 70 y.o. male with a known history of CAD, CHF, COPD, type 2 diabetes with hyperlipidemia came to the ER with worsening pain in his foot.  He has been followed as outpatient by Dr. [name] podiatry seen first on 10/21/2020.  He had gangrene of the toe at that time.  He was referred to vascular surgery for an angiogram.  On 10/31/2000 Dr. Lucky Cowboy did an angiogram and had 3 angioplasties and 1 stent placed.  With patient having worsening foot pain he came back to the hospital.  Pain 7 out of 10 in intensity.  He has been having trouble thinking straight.  His white blood cell count was elevated and he was tachycardic and still has gangrene.  Hospitalist services were contacted for admission for sepsis. Pt underwent L BKA on 11/10/20. PMH: CAD, chronic combined systolic & diastolic CHF, CKD stage 3, COPD, DM2, HLD, HTN, ischemic cardiomyopathy   Clinical Impression   Pt seen for OT evaluation this date in setting of acute hospitalization now s/p L BKA. Pt reports being INDEP at baseline including driving. This date, pt presents with significantly decreased strength and fxl activity tolerance as well as L LE pain and some impaired R LE sensation ("pins and needles") impacting his ability to safely and efficiently complete ADLs/ADL mobility. Pt currently requiring SETUP to MIN A for seated UB ADLs, MAX to TOTAL A for seated LB ADLs, unable to come to complete stand, currently requriing MOD/MAX A with scooting transfers. Pt left in chair with all needs met and in reach and RN notified of transfer status. Pt will require continued skilled OT in acute setting to improve fxl ADL performance and safety with mobility. Anticipate he will require f/u OT service at [facility] in SNF setting upon d/c from hospital.     Follow Up Recommendations  SNF     Equipment Recommendations  Other (comment) (defer)    Recommendations for Other Services       Precautions / Restrictions Precautions Precautions: Fall Restrictions Weight Bearing Restrictions: Yes LLE Weight Bearing: Non weight bearing Other Position/Activity Restrictions: BKA on 11/10/20      Mobility Bed Mobility Overal bed mobility: Needs Assistance Bed Mobility: Supine to Sit     Supine to sit: Min assist;HOB elevated Sit to supine: Min guard;HOB elevated   General bed mobility comments: increased time, cues for sequence    Transfers Overall transfer level: Needs assistance   Transfers: Lateral/Scoot Transfers          Lateral/Scoot Transfers: Mod assist;Max assist General transfer comment: from EOB To chair with drop-arm. Requires 3 scoots to get to chair, requries cues for hand placement and R foot placement to assist in transfer.    Balance Overall balance assessment: Needs assistance Sitting-balance support: Bilateral upper extremity supported Sitting balance-[name]: Fair Sitting balance - Comments: CGA sitting EOB                                   ADL either performed or assessed with clinical judgement   ADL Overall ADL's : Needs assistance/impaired  General ADL Comments: SETUP to MIN A for seated UB ADLs, MAX to TOTAL A for seated LB ADLs, unable to come to complete stand, currently requriing MOD/MAX A with scooting transfers.     Vision Patient Visual Report: No change from baseline       Perception     Praxis      Pertinent Vitals/Pain Pain Assessment: Faces Faces Pain Scale: Hurts little more Pain Location: L residual limb Pain Descriptors / Indicators: Discomfort;Sore Pain Intervention(s): Limited activity within patient's tolerance;Monitored during session;Repositioned     Hand Dominance     Extremity/Trunk Assessment Upper Extremity Assessment Upper  Extremity Assessment: Generalized weakness   Lower Extremity Assessment Lower Extremity Assessment: Generalized weakness;RLE deficits/detail;LLE deficits/detail RLE Deficits / Details: MMT for hip/knee grossly 4-/5, feel sintermittent "pins and needles" sensation to R foot LLE Deficits / Details: unable to fully extend L knee 2/2 pain with AROM. Pt is pain limited.       Communication     Cognition Arousal/Alertness: Lethargic Behavior During Therapy: WFL for tasks assessed/performed Overall Cognitive Status: Impaired/Different from baseline Area of Impairment: Orientation;Memory;Attention;Following commands;Safety/judgement;Problem solving;Awareness                 Orientation Level: Disoriented to;Time;Situation (pt states [facility], and with cues/prompts, eventially get [facility])   Memory: Decreased short-term memory;Decreased recall of precautions Following Commands: Follows one step commands with increased time Safety/Judgement: Decreased awareness of deficits;Decreased awareness of safety Awareness: Emergent Problem Solving: Slow processing;Decreased initiation;Requires verbal cues;Requires tactile cues General Comments: Pt somewhat more appropraite this session than with PT earlier today, but he is still somewhat confused. He is now oriented to place and does state correct month, but no other correct aspects of time. Follows ~80-90% of simple one step commands wtih increased time.   General Comments  Pt c/o dizziness upon sitting, BP 171/85 mmHg (MAP 119), HR 105 bpm    Exercises Other Exercises Other Exercises: OT educates pt and his sister who is present throughout re: the role of OT in acute setting, importance of OOB activity/repositioning (including attempting to extend L knee for future prosthetic fitting/use). Pt with moderate reception, but limimted carryover d/t acute confusion.   Shoulder Instructions      Home Living Family/patient  expects to be discharged to:: Private residence Living Arrangements: Children Available Help at Discharge: Available PRN/intermittently;Family (lives with son who works out of the home during the day) Type of Home: House Home Access: Stairs to enter Technical brewer of Steps: 2 Entrance Stairs-Rails: Left Home Layout: One level               Home Equipment: None          Prior Functioning/Environment Level of Independence: Independent        Comments: without AD        OT Problem List: Decreased strength;Decreased range of motion;Decreased activity tolerance;Impaired balance (sitting and/or standing);Decreased knowledge of use of DME or AE;Decreased cognition;Decreased safety awareness;Decreased knowledge of precautions;Impaired sensation;Obesity;Pain;Increased edema      OT Treatment/Interventions: Self-care/ADL training;DME and/or AE instruction;Therapeutic activities;Balance training;Therapeutic exercise;Energy conservation;Patient/family education    OT Goals(Current goals can be found in the care plan section) Acute Rehab OT Goals Patient Stated Goal: to be able to get up and down easier OT Goal Formulation: With patient Time For Goal Achievement: 11/25/20 Potential to Achieve Goals: Good ADL Goals Pt Will Perform Lower Body Dressing: with min assist;sitting/lateral leans (with AE PRN, LRAD to stand perform clothing mgt over  hips) Pt Will Transfer to Toilet: with min assist;with mod assist;stand pivot transfer;bedside commode Pt Will Perform Toileting - Clothing Manipulation and hygiene: with mod assist;sitting/lateral leans Pt/caregiver will Perform Home Exercise Program: Increased strength;Both right and left upper extremity;With Supervision  OT Frequency: Min 1X/week   Barriers to D/C: Decreased caregiver support          Co-evaluation              AM-PAC OT "6 Clicks" Daily Activity     Outcome Measure Help from another person eating meals?:  None Help from another person taking care of personal grooming?: A Little Help from another person toileting, which includes using toliet, bedpan, or urinal?: A Lot Help from another person bathing (including washing, rinsing, drying)?: A Lot Help from another person to put on and taking off regular upper body clothing?: A Little Help from another person to put on and taking off regular lower body clothing?: Total 6 Click Score: 15   End of Session Nurse Communication: Mobility status  Activity Tolerance: Patient tolerated treatment well Patient left: in chair;with call bell/phone within reach  OT Visit Diagnosis: Unsteadiness on feet (R26.81);Muscle weakness (generalized) (M62.81);Pain Pain - Right/Left: Left Pain - part of body: Knee;Leg                Time: 3235-5732 OT Time Calculation (min): 51 min Charges:  OT General Charges $OT Visit: 1 Visit OT Evaluation $OT Eval Moderate Complexity: 1 Mod OT Treatments $Self Care/Home Management : 8-22 mins $Therapeutic Activity: 23-37 mins  Gerrianne Scale, MS, OTR/L ascom [phone] 11/11/20, 6:31 PM

## 2020-11-11 NOTE — Progress Notes (Signed)
PHARMACIST - PHYSICIAN COMMUNICATION  CONCERNING:  Enoxaparin (Lovenox) for DVT Prophylaxis    RECOMMENDATION: Patient was prescribed enoxaprin 40mg  q24 hours for VTE prophylaxis.   Filed Weights   11/06/20 1536 11/11/20 0452  Weight: 100 kg (220 lb 7.4 oz) 100.9 kg (222 lb 8 oz)    Body mass index is 33.83 kg/m.  Estimated Creatinine Clearance: 70.4 mL/min (by C-G formula based on SCr of 1.14 mg/dL).   Based on [facility] policy patient is candidate for enoxaparin 0.5mg /kg TBW SQ every 24 hours based on BMI being >30.  DESCRIPTION: Pharmacy has adjusted enoxaparin dose per [facility] policy.  Patient is now receiving enoxaparin 50 mg every 24 hours    [facility], PharmD Clinical Pharmacist  11/11/2020 1:21 PM

## 2020-11-11 NOTE — TOC Progression Note (Signed)
Transition of Care (TOC) - Progression Note    Patient Details  Name: MAGIC MOHLER Sr. MRN: [id number] Date of Birth: 09/04/1950  Transition of Care [facility]) CM/SW Contact  Liliana Cline, LCSW Phone Number: 11/11/2020, 4:43 PM  Clinical Narrative:   PASRR completed. SNF work up started.    Expected Discharge Plan: Skilled Nursing Facility Barriers to Discharge: Continued Medical Work up  Expected Discharge Plan and Services Expected Discharge Plan: Skilled Nursing Facility   Discharge Planning Services: CM Consult   Living arrangements for the past 2 months: Single Family Home                                       Social Determinants of Health (SDOH) Interventions    Readmission Risk Interventions Readmission Risk Prevention Plan 07/30/2019  Transportation Screening Complete  PCP or Specialist Appt within 5-7 Days Complete  Medication Review (RN CM) Complete  Some recent data might be hidden

## 2020-11-11 NOTE — Care Management Important Message (Signed)
Important Message  Patient Details  Name: Danny WOHL Sr. MRN: [id number] Date of Birth: 11/22/1950   Medicare Important Message Given:  Yes     Johnell Comings 11/11/2020, 10:55 AM

## 2020-11-12 DIAGNOSIS — L03116 Cellulitis of left lower limb: Secondary | ICD-10-CM | POA: Diagnosis not present

## 2020-11-12 DIAGNOSIS — I5022 Chronic systolic (congestive) heart failure: Secondary | ICD-10-CM | POA: Diagnosis not present

## 2020-11-12 DIAGNOSIS — J449 Chronic obstructive pulmonary disease, unspecified: Secondary | ICD-10-CM | POA: Diagnosis not present

## 2020-11-12 LAB — GLUCOSE, CAPILLARY
Glucose-Capillary: 95 mg/dL (ref 70–99)
Glucose-Capillary: 222 mg/dL — ABNORMAL HIGH (ref 70–99)
Glucose-Capillary: 171 mg/dL — ABNORMAL HIGH (ref 70–99)
Glucose-Capillary: 151 mg/dL — ABNORMAL HIGH (ref 70–99)

## 2020-11-12 LAB — BASIC METABOLIC PANEL
Anion gap: 7 (ref 5–15)
BUN: 20 mg/dL (ref 8–23)
CO2: 23 mmol/L (ref 22–32)
Calcium: 8.1 mg/dL — ABNORMAL LOW (ref 8.9–10.3)
Chloride: 107 mmol/L (ref 98–111)
Creatinine, Ser: 0.92 mg/dL (ref 0.61–1.24)
GFR, Estimated: >60 mL/min (ref >60)
Glucose, Bld: 127 mg/dL — ABNORMAL HIGH (ref 70–99)
Potassium: 3.8 mmol/L (ref 3.5–5.1)
Sodium: 137 mmol/L (ref 135–145)

## 2020-11-12 LAB — CBC WITH DIFFERENTIAL/PLATELET
Abs Immature Granulocytes: 0.4 10*3/uL — ABNORMAL HIGH (ref 0.00–0.07)
Basophils Absolute: 0.1 10*3/uL (ref 0.0–0.1)
Basophils Relative: 0 %
Eosinophils Absolute: 0.5 10*3/uL (ref 0.0–0.5)
Eosinophils Relative: 4 %
HCT: 33.8 % — ABNORMAL LOW (ref 39.0–52.0)
Hemoglobin: 11.1 g/dL — ABNORMAL LOW (ref 13.0–17.0)
Immature Granulocytes: 3 %
Lymphocytes Relative: 10 %
Lymphs Abs: 1.4 10*3/uL (ref 0.7–4.0)
MCH: 30.7 pg (ref 26.0–34.0)
MCHC: 32.8 g/dL (ref 30.0–36.0)
MCV: 93.6 fL (ref 80.0–100.0)
Monocytes Absolute: 1.7 10*3/uL — ABNORMAL HIGH (ref 0.1–1.0)
Monocytes Relative: 12 %
Neutro Abs: 9.8 10*3/uL — ABNORMAL HIGH (ref 1.7–7.7)
Neutrophils Relative %: 71 %
Platelets: 486 10*3/uL — ABNORMAL HIGH (ref 150–400)
RBC: 3.61 MIL/uL — ABNORMAL LOW (ref 4.22–5.81)
RDW: 13.1 % (ref 11.5–15.5)
WBC: 13.8 10*3/uL — ABNORMAL HIGH (ref 4.0–10.5)
nRBC: 0 % (ref 0.0–0.2)

## 2020-11-12 MED ORDER — LACTULOSE 10 GM/15ML PO SOLN
20.0000 g | Freq: Once | ORAL | Status: AC
  Administered 2020-11-12: 20 g via ORAL
  Filled 2020-11-12: qty 30

## 2020-11-12 MED ORDER — SPIRONOLACTONE 25 MG PO TABS
25.0000 mg | ORAL_TABLET | Freq: Every day | ORAL | Status: DC
  Administered 2020-11-13 – 2020-11-16 (×4): 25 mg via ORAL
  Filled 2020-11-12 (×4): qty 1

## 2020-11-12 MED ORDER — FUROSEMIDE 10 MG/ML IJ SOLN
40.0000 mg | Freq: Once | INTRAMUSCULAR | Status: AC
  Administered 2020-11-12: 13:00:00 40 mg via INTRAVENOUS
  Filled 2020-11-12: qty 4

## 2020-11-12 MED ORDER — SPIRONOLACTONE 25 MG PO TABS
12.5000 mg | ORAL_TABLET | Freq: Once | ORAL | Status: DC
  Filled 2020-11-12: qty 0.5
  Filled 2020-11-12: qty 1

## 2020-11-12 NOTE — Progress Notes (Signed)
Physical Therapy Evaluation Patient Details Name: Danny [name] Sr. MRN: [id number] DOB: 12-23-1950 Today's Date: 11/12/2020   History of Present Illness  Jarom Govan  is a 70 y.o. male with a known history of CAD, CHF, COPD, type 2 diabetes with hyperlipidemia came to the ER with worsening pain in his foot.  He has been followed as outpatient by Dr. Alberteen Spindle podiatry seen first on 10/21/2020.  He had gangrene of the toe at that time.  He was referred to vascular surgery for an angiogram.  On 10/31/2000 Dr. Wyn Quaker did an angiogram and had 3 angioplasties and 1 stent placed.  With patient having worsening foot pain he came back to the hospital.  Pain 7 out of 10 in intensity.  He has been having trouble thinking straight.  His white blood cell count was elevated and he was tachycardic and still has gangrene.  Hospitalist services were contacted for admission for sepsis. Pt underwent L BKA on 11/10/20. PMH: CAD, chronic combined systolic & diastolic CHF, CKD stage 3, COPD, DM2, HLD, HTN, ischemic cardiomyopathy  Clinical Impression  Patient supine in bed upon PT arrival, willing to participate in PT interventions. Pt provided education on appropriate residual limb positioning to improve knee extension to provide better ROM for future prosthetic. Educated pt on de-sensitizing techniques to reduce phantom limb pain. Pt verbalized understanding of education. Completed supine<>sit transfer with moderate A from therapist, pt requiring VCs for hand placement and sequencing. Pt able to sit EOB with BIL UE and unilateral LE support. Pt reported some increased dizziness that surpassed with PLB <1 min. Attempted sit<>stand transfer using RW with max A from therapist, VCs for sequencing and hand placement. Pt unable to come to a full upright posture, but he was able to complete a partial sit<>stand transfer. Pt able to side scoot at EOB with mod A from therapist using draw sheet. Pt completed therapeutic exercises including: quad  sets with 5 second hold x5, SAQ x10, SLR x10 BIL with tactile and verbal cues provided to pt to improve quad activation. Pt reported increased fatigue following interventions today, but overall was willing and motivated to participate in physical therapy activities.     Follow Up Recommendations SNF    Equipment Recommendations  None recommended by PT    Recommendations for Other Services       Precautions / Restrictions Precautions Precautions: Fall Restrictions Weight Bearing Restrictions: Yes LLE Weight Bearing: Non weight bearing Other Position/Activity Restrictions: BKA on 11/10/20      Mobility  Bed Mobility Overal bed mobility: Needs Assistance Bed Mobility: Supine to Sit     Supine to sit: Min assist;HOB elevated Sit to supine: Min guard;HOB elevated   General bed mobility comments: increased time, cues for sequence    Transfers Overall transfer level: Needs assistance Equipment used: Standard walker Transfers: Lateral/Scoot Transfers (Attempted sit<>stand transfer, but pt was unable to come to the upright position due to weakness and fatigue)          Lateral/Scoot Transfers: Mod assist General transfer comment: Pt required 4 scoots with mod A using draw sheet, VCs for hand placement  Ambulation/Gait                Stairs            Wheelchair Mobility    Modified Rankin (Stroke Patients Only)       Balance Overall balance assessment: Needs assistance Sitting-balance support: Bilateral upper extremity supported Sitting balance-[name]: Fair Sitting balance -  Comments: SBA seated EOB Postural control: Right lateral lean                                   Pertinent Vitals/Pain Faces Pain Scale: Hurts little more Pain Location: L residual limb Pain Descriptors / Indicators: Discomfort;Sore;Aching;Shooting Pain Intervention(s): Limited activity within patient's tolerance;Repositioned    Home Living                         Prior Function                 Hand Dominance        Extremity/Trunk Assessment                Communication      Cognition Arousal/Alertness: Awake/alert Behavior During Therapy: WFL for tasks assessed/performed Overall Cognitive Status: Impaired/Different from baseline Area of Impairment: Safety/judgement                 Orientation Level: Person;Place;Situation       Safety/Judgement: Decreased awareness of safety;Decreased awareness of deficits   Problem Solving: Requires verbal cues;Requires tactile cues General Comments: Pt able to follow verbal and tactile cues given during session with increased time      General Comments General comments (skin integrity, edema, etc.): Pt with c/o dizziness that subsided with pursed lip breathing after being seated EOB for <1 min    Exercises Total Joint Exercises Quad Sets: AROM;Left;5 reps (5 second hold, verbal and tactile cues to improve quad activation) Short Arc Quad: AROM;Left;10 reps (Pt required multiple verbal and tactile cues to complete exercise with appropriate quad activation) Straight Leg Raises: AROM;Both;15 reps Other Exercises Other Exercises: PT educated pt on appropriate LE position to improve knee extension to allow for better alignment for a future prosthesis, currently pt lacks 10 degrees of extension. Educated patient on phantom limb pain and desensitizing techniques to decrease pain. Pt verbalized understanding of education. Attemepted sit<>standing transfer with pt seated EOB, but he was able to come to a complete upright position with bed elevated and max A with therapist.   Assessment/Plan    PT Assessment    PT Problem List         PT Treatment Interventions      PT Goals (Current goals can be found in the Care Plan section)  Acute Rehab PT Goals Patient Stated Goal: to be able to get up and down easier PT Goal Formulation: With patient Time For Goal  Achievement: 11/25/20 Potential to Achieve Goals: Fair    Frequency 7X/week   Barriers to discharge        Co-evaluation               AM-PAC PT "6 Clicks" Mobility  Outcome Measure Help needed turning from your back to your side while in a flat bed without using bedrails?: A Little Help needed moving from lying on your back to sitting on the side of a flat bed without using bedrails?: A Lot Help needed moving to and from a bed to a chair (including a wheelchair)?: Total Help needed standing up from a chair using your arms (e.g., wheelchair or bedside chair)?: Total Help needed to walk in hospital room?: Total Help needed climbing 3-5 steps with a railing? : Total 6 Click Score: 9    End of Session Equipment Utilized During Treatment: Gait belt Activity Tolerance: Patient limited  by pain Patient left: in bed;with call bell/phone within reach;with bed alarm set;with family/visitor present   PT Visit Diagnosis: Difficulty in walking, not elsewhere classified (R26.2);Muscle weakness (generalized) (M62.81);Pain Pain - Right/Left: Left Pain - part of body: Leg    Time: 1324-4010 PT Time Calculation (min) (ACUTE ONLY): 32 min   Charges:     PT Treatments $Therapeutic Activity: 23-37 mins        Minette Headland, PT, DPT 11/12/20, 12:37 PM   Courtenay Hirth Marcelle Overlie 11/12/2020, 12:29 PM

## 2020-11-12 NOTE — Progress Notes (Addendum)
Progress Note  Patient Name: Danny LUPERCIO Sr. Date of Encounter: 11/12/2020  Primary Cardiologist: Julien Nordmann, MD  Subjective   Denies chest pain. L leg pain improving.  A little sob when talking this AM.  Inpatient Medications    Scheduled Meds: . aspirin EC  81 mg Oral Daily  . atorvastatin  80 mg Oral QHS  . carvedilol  25 mg Oral BID WC  . enoxaparin (LOVENOX) injection  0.5 mg/kg Subcutaneous Q24H  . escitalopram  10 mg Oral Daily  . ezetimibe  10 mg Oral Daily  . furosemide  20 mg Oral Daily  . insulin aspart  0-15 Units Subcutaneous TID WC  . insulin aspart  0-5 Units Subcutaneous QHS  . insulin glargine  25 Units Subcutaneous Daily  . isosorbide mononitrate  30 mg Oral Daily  . losartan  50 mg Oral Daily  . potassium chloride  10 mEq Oral Daily  . senna-docusate  2 tablet Oral BID  . [START ON 11/13/2020] spironolactone  25 mg Oral Daily  . tamsulosin  0.4 mg Oral Daily   Continuous Infusions:  PRN Meds: acetaminophen **OR** acetaminophen, baclofen, ipratropium-albuterol, morphine injection, nitroGLYCERIN, ondansetron **OR** ondansetron (ZOFRAN) IV, oxyCODONE-acetaminophen   Vital Signs    Vitals:   11/11/20 2241 11/12/20 0504 11/12/20 0833 11/12/20 1218  BP: 140/71 (!) 161/82 (!) 173/96 98/72  Pulse: 92 82 93 95  Resp: 18 16 20 20   Temp: 98.3 F (36.8 C) 98.4 F (36.9 C) 98.7 F (37.1 C) 97.8 F (36.6 C)  TempSrc: Oral Oral  Oral  SpO2: 94% 95% 91% 95%  Weight:      Height:        Intake/Output Summary (Last 24 hours) at 11/12/2020 1412 Last data filed at 11/12/2020 1401 Gross per 24 hour  Intake 600 ml  Output 700 ml  Net -100 ml   Filed Weights   11/06/20 1536 11/11/20 0452  Weight: 100 kg 100.9 kg    Physical Exam   GEN: Well nourished, well developed.  Speech somewhat labored. HEENT: Grossly normal.  Neck: Supple, no JVD, carotid bruits, or masses. Cardiac: RRR, no murmurs, rubs, or gallops. No clubbing, cyanosis, edema.  Radials  2+, L BKA. Respiratory:  Respirations regular and unlabored, diminished @ bases bilaterally. GI: Semi-firm and protuberant, nontender, nondistended, BS + x 4. MS: no deformity or atrophy. Skin: warm and dry, no rash. Neuro:  Strength and sensation are intact. Psych: AAOx3.  Normal affect.  Labs    Chemistry Recent Labs  Lab 11/06/20 1541 11/07/20 0847 11/09/20 0509 11/10/20 0507 11/11/20 0640 11/12/20 0451  NA 135   < > 136 137 137 137  K 3.9   < > 3.9 4.3 3.9 3.8  CL 102   < > 107 107 108 107  CO2 21*   < > 22 23 21* 23  GLUCOSE 212*   < > 114* 133* 148* 127*  BUN 23   < > 26* 29* 22 20  CREATININE 1.27*   < > 1.13 1.32* 1.14 0.92  CALCIUM 8.6*   < > 8.5* 8.5* 8.2* 8.1*  PROT 7.0  --  6.5  --   --   --   ALBUMIN 3.0*  --  2.4*  --   --   --   AST 74*  --  46*  --   --   --   ALT 99*  --  60*  --   --   --  ALKPHOS 129*  --  120  --   --   --   BILITOT 0.8  --  0.8  --   --   --   GFRNONAA >60   < > >60 58* >60 >60  ANIONGAP 12   < > 7 7 8 7    < > = values in this interval not displayed.     Hematology Recent Labs  Lab 11/10/20 0507 11/11/20 0640 11/12/20 0451  WBC 19.1* 18.0* 13.8*  RBC 4.30 4.07* 3.61*  HGB 12.6* 12.2* 11.1*  HCT 40.1 37.7* 33.8*  MCV 93.3 92.6 93.6  MCH 29.3 30.0 30.7  MCHC 31.4 32.4 32.8  RDW 13.0 13.1 13.1  PLT 479* 519* 486*    Lipids  Lab Results  Component Value Date   CHOL 96 10/26/2020   HDL 46 10/26/2020   LDLCALC 38 10/26/2020   LDLDIRECT 64.0 03/20/2018   TRIG 60 10/26/2020   CHOLHDL 2.1 10/26/2020    HbA1c  Lab Results  Component Value Date   HGBA1C 8.4 (H) 09/30/2020    Radiology    ----  Telemetry     Not on tele  Cardiac Studies   2D Echocardiogram 4.5.2022  1. Left ventricular ejection fraction, by estimation, is 35 to 40%. The  left ventricle has moderately decreased function. The left ventricle  demonstrates global hypokinesis. The left ventricular internal cavity size  was mildly dilated.  Left ventricular  diastolic parameters are consistent with Grade I diastolic dysfunction  (impaired relaxation).  2. Right ventricular systolic function is normal. The right ventricular  size is normal. Tricuspid regurgitation signal is inadequate for assessing  PA pressure.  3. The mitral valve is grossly normal. Trivial mitral valve  regurgitation. No evidence of mitral stenosis.  4. The aortic valve was not well visualized. Aortic valve regurgitation  is not visualized. No aortic stenosis is present.  5. The inferior vena cava is normal in size with <50% respiratory  variability, suggesting right atrial pressure of 8 mmHg.   Patient Profile     70 y.o. male with a hx of CAD s/p LAD stenting, ICM, chronic combined CHF, stage 3 CKD, PAD, COPD, remote tobacco use, DM2, HTN, HLD, ED, and obesity, who was admitted 4/3 w/ worsening L foot ischemia/gangrene, now s/p L BKA.  Assessment & Plan    1.  L LE ischemia/gangrene:  S/p L BKA.  Pain mgmt per vasc surgery.  2.  Sepsis/Leuikocytosis:  abx per IM.  Hemodynamically stable.  3.  HFrEF/ICM:  He has prior h/o ICM/HFrEF w/ EF previously 30% in 07/2016 followed by improvement on echo 03/2017 (55-60%).  Echo performed preoperatively this admission w/ finding of recurrent LV dysfxn and EF of 35-40%, Gr1 diast dysfxn, and global HK. His speech is somewhat labored this AM.  Diminished breath sounds @ bases.  +710 I/O yesterday.  Abd is semi-firm/protuberant.  He has been receiving lasix 20 daily. Will give dose of 40 IV this AM.  Watch fluids closely.  BPs remain elevated.  Changed to moderate dose losartan for this AM and if tolerated, will plan to change to entresto tomorrow (received lisinopril 4/8 AM).  Titrate spiro to 25mg  daily.  Cont  blocker and lasix.  Will plan to add SGLT2i in near future.  4.  Essential HTN:  Still trending high.  Losartan 50 ordered for this AM in addition to spiro and carvedilol.  Will titrate spiro to 25 daily.   Consider entresto tomorrow.  5.  CAD:  S/p prior PCI  LAD.  No c/p or dyspnea.  With recurrent LV dysfxn, will need outpt ischemic eval following full recovery from BKA.  Cont asa, statin,  blocker, nitrate.  6.  HL:  Cont statin.  LDL 38.  7.  CKD III:  Creat improved this AM.  8.  DMII:  A1c 8.4.  Per IM.  Will look to add SGLT2i in near future.  Signed, Ward Givens NP  HFrEF  BP labile  ICM prior LAD stenting   BKA 2/2 PVDisease   Seen and examined.  Still SOB, but diuresis complicated by hypotension.  Will need to make sure all are measured on the same arm as he is a set up for [place] artery;  If not hypotension is concerning   Continue diuresis      Sherryl Manges, MD  11/12/2020, 2:12 PM    For questions or updates, please contact   Please consult [email or web address] for contact info under Cardiology/STEMI.

## 2020-11-12 NOTE — Progress Notes (Addendum)
2 Days Post-Op   Subjective/Chief Complaint: Patient feeling better, still has not gotten out of bed, but states pain has improved. He is stressed about his condition.  PT has not seen him yet.    Objective: Vital signs in last 24 hours: Temp:  [98.2 F (36.8 C)-99.2 F (37.3 C)] 98.7 F (37.1 C) (04/09 0833) Pulse Rate:  [82-102] 93 (04/09 0833) Resp:  [16-20] 20 (04/09 0833) BP: (140-179)/(71-96) 173/96 (04/09 0833) SpO2:  [91 %-96 %] 91 % (04/09 0833) Last BM Date: 11/09/20  Intake/Output from previous day: 04/08 0701 - 04/09 0700 In: 960 [P.O.:960] Out: 250 [Urine:250] Intake/Output this shift: Total I/O In: -  Out: 700 [Urine:700]  General appearance: alert and cooperative Head: Normocephalic, without obvious abnormality, atraumatic Neck: no adenopathy, no carotid bruit, no JVD, supple, symmetrical, trachea midline and thyroid not enlarged, symmetric, no tenderness/mass/nodules Back: symmetric, no curvature. ROM normal. No CVA tenderness. Extremities: Left stump dressing is intact.  Patient attempted flexion extentsion with minimal discomfort.  Skin: Skin color, texture, turgor normal. No rashes or lesions Incision/Wound:  Lab Results:  Recent Labs    11/11/20 0640 11/12/20 0451  WBC 18.0* 13.8*  HGB 12.2* 11.1*  HCT 37.7* 33.8*  PLT 519* 486*   BMET Recent Labs    11/11/20 0640 11/12/20 0451  NA 137 137  K 3.9 3.8  CL 108 107  CO2 21* 23  GLUCOSE 148* 127*  BUN 22 20  CREATININE 1.14 0.92  CALCIUM 8.2* 8.1*   PT/INR Recent Labs    11/10/20 0507  LABPROT 15.8*  INR 1.3*   ABG No results for input(s): PHART, HCO3 in the last 72 hours.  Invalid input(s): PCO2, PO2  Studies/Results: No results found.  Anti-infectives: Anti-infectives (From admission, onward)   Start     Dose/Rate Route Frequency Ordered Stop   11/10/20 0600  ceFAZolin (ANCEF) IVPB 2g/100 mL premix        2 g 200 mL/hr over 30 Minutes Intravenous On call to O.R. 11/10/20  0451 11/10/20 1515   11/10/20 0000  Ampicillin-Sulbactam (UNASYN) 3 g in sodium chloride 0.9 % 100 mL IVPB        3 g 200 mL/hr over 30 Minutes Intravenous Every 6 hours 11/09/20 1804 11/12/20 0951   11/07/20 1800  vancomycin (VANCOREADY) IVPB 1250 mg/250 mL        1,250 mg 166.7 mL/hr over 90 Minutes Intravenous Every 24 hours 11/06/20 1811 11/12/20 1000   11/07/20 0130  metroNIDAZOLE (FLAGYL) IVPB 500 mg  Status:  Discontinued        500 mg 100 mL/hr over 60 Minutes Intravenous Every 8 hours 11/07/20 0127 11/09/20 1804   11/06/20 2300  ceFEPIme (MAXIPIME) 2 g in sodium chloride 0.9 % 100 mL IVPB  Status:  Discontinued        2 g 200 mL/hr over 30 Minutes Intravenous Every 8 hours 11/06/20 1805 11/09/20 1804   11/06/20 2000  vancomycin (VANCOREADY) IVPB 1500 mg/300 mL        1,500 mg 150 mL/hr over 120 Minutes Intravenous  Once 11/06/20 1805 11/07/20 0950   11/06/20 1830  metroNIDAZOLE (FLAGYL) IVPB 500 mg  Status:  Discontinued        500 mg 100 mL/hr over 60 Minutes Intravenous Every 8 hours 11/06/20 1825 11/07/20 0127   11/06/20 1700  vancomycin (VANCOCIN) IVPB 1000 mg/200 mL premix        1,000 mg 200 mL/hr over 60 Minutes Intravenous  Once 11/06/20 1645  11/07/20 0700   11/06/20 1700  ceFEPIme (MAXIPIME) 1 g in sodium chloride 0.9 % 100 mL IVPB        1 g 200 mL/hr over 30 Minutes Intravenous  Once 11/06/20 1645 11/06/20 1727      Assessment/Plan: s/p Procedure(s): AMPUTATION BELOW KNEE (Left) Plan for discharge tomorrow  LOS: 6 days    Louisa Second 11/12/2020

## 2020-11-12 NOTE — Progress Notes (Signed)
PROGRESS NOTE    Danny TOMAS Sr.  GYK:[id number] DOB: [birth year] DOA: 11/06/2020 PCP: [name], MD   Follow up on LEFT FOOT GANGRENE. Brief Narrative:  Patient admitted 11/06/2020 with clinical sepsis and gangrene of the left first toe and starting of the second toe and also gangrene going down the plantar surface of the foot. Patient had leukocytosis and tachycardia. Patient has been following with podiatry as outpatient and also had a recent angiogram by vascular surgery where they did 3 angioplasties and one stent. Patient seen in consultation by both podiatry and vascular surgery and he will be set up for a BKA on Thursday. Past medical history type 2 diabetes mellitus, hyperlipidemia, CAD, chronic diastolic CHF, COPD, essential hypertension and chronic kidney disease stage II Left lower extremity BKA on 4/7.   Assessment & Plan:   Active Problems:   Type 2 diabetes mellitus with hyperlipidemia (HCC)   Essential hypertension   Chronic diastolic CHF (congestive heart failure) (HCC)   Sepsis (HCC)   Gangrene of left foot (HCC)   Stage 2 chronic kidney disease   Thrush   Cellulitis of left foot   HFrEF (heart failure with reduced ejection fraction) (HCC)   Chronic systolic heart failure (HCC)  #1. Sepsis.  Left foot cellulitis. POA  Left foot gangrene in the first, second and third toe.  Peripheral vascular disease. Status left BKA. Condition has been improving, status post left BKA.  Will complete antibiotics today. Continue PT/OT. Amputation stump has no bleeding.  2.  Essential hypertension. Continue Coreg, cardiology has changed lisinopril to losartan.  3.  Uncontrolled type 2 diabetes with hyperglycemia Continue current regimen.  4.  Chronic systolic congestive heart failure. Stable.  #5 reactive thrombocytosis. Stable.    DVT prophylaxis: Lovenox Code Status: Full Family Communication:  Disposition Plan:  .   Status is: Inpatient  Remains  inpatient appropriate because:Inpatient level of care appropriate due to severity of illness   Dispo: The patient is from: Home              Anticipated d/c is to: SNF              Patient currently is not medically stable to d/c.   Difficult to place patient No        I/O last 3 completed shifts: In: 1822.8 [P.O.:1140; I.V.:682.8] Out: 1050 [Urine:1050] No intake/output data recorded.     Consultants:   Cardiology, vascular  Procedures: BKA  Antimicrobials:None  Subjective: Patient slept well last night, still has some intermittent pain in the left leg stump. Denies any short of breath or cough No fever or chills. No dysuria or hematuria.  Objective: Vitals:   11/11/20 2108 11/11/20 2241 11/12/20 0504 11/12/20 0833  BP: (!) 177/96 140/71 (!) 161/82 (!) 173/96  Pulse: 91 92 82 93  Resp: 19 18 16 20   Temp: 98.2 F (36.8 C) 98.3 F (36.8 C) 98.4 F (36.9 C) 98.7 F (37.1 C)  TempSrc:  Oral Oral   SpO2: 96% 94% 95% 91%  Weight:      Height:        Intake/Output Summary (Last 24 hours) at 11/12/2020 0913 Last data filed at 11/11/2020 2100 Gross per 24 hour  Intake 960 ml  Output --  Net 960 ml   Filed Weights   11/06/20 1536 11/11/20 0452  Weight: 100 kg 100.9 kg    Examination:  General exam: Appears calm and comfortable  Respiratory system: Clear to auscultation. Respiratory  effort normal. Cardiovascular system: S1 & S2 heard, RRR. No JVD, murmurs, rubs, gallops or clicks. No pedal edema. Gastrointestinal system: Abdomen is nondistended, soft and nontender. No organomegaly or masses felt. Normal bowel sounds heard. Central nervous system: Alert and oriented. No focal neurological deficits. Extremities: Left BKA, stump has no bleeding Skin: No rashes, lesions or ulcers Psychiatry: . Mood & affect appropriate.     Data Reviewed: I have personally reviewed following labs and imaging studies  CBC: Recent Labs  Lab 11/06/20 1541 11/08/20 0435  11/10/20 0507 11/11/20 0640 11/12/20 0451  WBC 24.4* 21.6* 19.1* 18.0* 13.8*  NEUTROABS 20.6*  --   --  14.3* 9.8*  HGB 13.5 12.3* 12.6* 12.2* 11.1*  HCT 42.3 37.8* 40.1 37.7* 33.8*  MCV 93.2 92.9 93.3 92.6 93.6  PLT 407* 378 479* 519* 486*   Basic Metabolic Panel: Recent Labs  Lab 11/08/20 0435 11/09/20 0509 11/10/20 0507 11/11/20 0640 11/12/20 0451  NA 135 136 137 137 137  K 3.7 3.9 4.3 3.9 3.8  CL 106 107 107 108 107  CO2 21* 22 23 21* 23  GLUCOSE 103* 114* 133* 148* 127*  BUN 23 26* 29* 22 20  CREATININE 1.11 1.13 1.32* 1.14 0.92  CALCIUM 8.3* 8.5* 8.5* 8.2* 8.1*  MG  --   --  2.1 1.9  --    GFR: Estimated Creatinine Clearance: 87.2 mL/min (by C-G formula based on SCr of 0.92 mg/dL). Liver Function Tests: Recent Labs  Lab 11/06/20 1541 11/09/20 0509  AST 74* 46*  ALT 99* 60*  ALKPHOS 129* 120  BILITOT 0.8 0.8  PROT 7.0 6.5  ALBUMIN 3.0* 2.4*   No results for input(s): LIPASE, AMYLASE in the last 168 hours. No results for input(s): AMMONIA in the last 168 hours. Coagulation Profile: Recent Labs  Lab 11/10/20 0507  INR 1.3*   Cardiac Enzymes: No results for input(s): CKTOTAL, CKMB, CKMBINDEX, TROPONINI in the last 168 hours. BNP (last 3 results) No results for input(s): PROBNP in the last 8760 hours. HbA1C: No results for input(s): HGBA1C in the last 72 hours. CBG: Recent Labs  Lab 11/11/20 0831 11/11/20 1138 11/11/20 1629 11/11/20 2107 11/12/20 0835  GLUCAP 168* 123* 160* 131* 95   Lipid Profile: No results for input(s): CHOL, HDL, LDLCALC, TRIG, CHOLHDL, LDLDIRECT in the last 72 hours. Thyroid Function Tests: No results for input(s): TSH, T4TOTAL, FREET4, T3FREE, THYROIDAB in the last 72 hours. Anemia Panel: No results for input(s): VITAMINB12, FOLATE, FERRITIN, TIBC, IRON, RETICCTPCT in the last 72 hours. Sepsis Labs: Recent Labs  Lab 11/06/20 1541 11/06/20 2007  LATICACIDVEN 1.7 1.1    Recent Results (from the past 240 hour(s))   Culture, blood (routine x 2)     Status: None   Collection Time: 11/06/20  3:14 PM   Specimen: BLOOD  Result Value Ref Range Status   Specimen Description BLOOD  RT [facility]  Final   Special Requests   Final    BOTTLES DRAWN AEROBIC AND ANAEROBIC Blood Culture adequate volume   Culture   Final    NO GROWTH 5 DAYS Performed at [facility], [address] [place], [place] [address]    Report Status 11/11/2020 FINAL  Final  Culture, blood (routine x 2)     Status: None   Collection Time: 11/06/20  3:41 PM   Specimen: BLOOD  Result Value Ref Range Status   Specimen Description BLOOD  RT FOREARM  Final   Special Requests   Final  BOTTLES DRAWN AEROBIC AND ANAEROBIC Blood Culture adequate volume   Culture   Final    NO GROWTH 5 DAYS Performed at [facility], [address] [place] [place], [place] [address]    Report Status 11/11/2020 FINAL  Final  SARS CORONAVIRUS 2 (TAT 6-24 HRS) Nasopharyngeal Nasopharyngeal Swab     Status: None   Collection Time: 11/06/20 11:35 PM   Specimen: Nasopharyngeal Swab  Result Value Ref Range Status   SARS Coronavirus 2 NEGATIVE NEGATIVE Final    Comment: (NOTE) SARS-CoV-2 target nucleic acids are NOT DETECTED.  The SARS-CoV-2 RNA is generally detectable in upper and lower respiratory specimens during the acute phase of infection. Negative results do not preclude SARS-CoV-2 infection, do not rule out co-infections with other pathogens, and should not be used as the sole basis for treatment or other patient management decisions. Negative results must be combined with clinical observations, patient history, and epidemiological information. The expected result is Negative.  Fact Sheet for Patients: HairSlick.no  Fact Sheet for Healthcare Providers: quierodirigir.com  This test is not yet approved or cleared by the [place] FDA and  has been authorized for detection and/or  diagnosis of SARS-CoV-2 by FDA under an Emergency Use Authorization (EUA). This EUA will remain  in effect (meaning this test can be used) for the duration of the COVID-19 declaration under Se ction 564(b)(1) of the Act, 21 U.S.C. section 360bbb-3(b)(1), unless the authorization is terminated or revoked sooner.  Performed at [facility], [address], [place], [place] [address]          Radiology Studies: No results found.      Scheduled Meds: . aspirin EC  81 mg Oral Daily  . atorvastatin  80 mg Oral QHS  . carvedilol  25 mg Oral BID WC  . enoxaparin (LOVENOX) injection  0.5 mg/kg Subcutaneous Q24H  . escitalopram  10 mg Oral Daily  . ezetimibe  10 mg Oral Daily  . furosemide  20 mg Oral Daily  . insulin aspart  0-15 Units Subcutaneous TID WC  . insulin aspart  0-5 Units Subcutaneous QHS  . insulin glargine  25 Units Subcutaneous Daily  . isosorbide mononitrate  30 mg Oral Daily  . losartan  50 mg Oral Daily  . potassium chloride  10 mEq Oral Daily  . senna-docusate  2 tablet Oral BID  . spironolactone  12.5 mg Oral Daily  . tamsulosin  0.4 mg Oral Daily   Continuous Infusions: . ampicillin-sulbactam (UNASYN) IV 3 g (11/12/20 0400)  . vancomycin 1,250 mg (11/11/20 2138)     LOS: 6 days    Time spent: 27 minutes    Marrion Coy, MD [facility]   To contact the attending provider between 7A-7P or the covering provider during after hours 7P-7A, please log into the web site [email or web address] and access using universal [place] password for that web site. If you do not have the password, please call the hospital operator.  11/12/2020, 9:13 AM

## 2020-11-13 ENCOUNTER — Encounter: Payer: Self-pay | Admitting: Internal Medicine

## 2020-11-13 DIAGNOSIS — I5022 Chronic systolic (congestive) heart failure: Secondary | ICD-10-CM | POA: Diagnosis not present

## 2020-11-13 DIAGNOSIS — I96 Gangrene, not elsewhere classified: Secondary | ICD-10-CM | POA: Diagnosis not present

## 2020-11-13 DIAGNOSIS — L03116 Cellulitis of left lower limb: Secondary | ICD-10-CM | POA: Diagnosis not present

## 2020-11-13 LAB — GLUCOSE, CAPILLARY
Glucose-Capillary: 230 mg/dL — ABNORMAL HIGH (ref 70–99)
Glucose-Capillary: 243 mg/dL — ABNORMAL HIGH (ref 70–99)
Glucose-Capillary: 234 mg/dL — ABNORMAL HIGH (ref 70–99)
Glucose-Capillary: 299 mg/dL — ABNORMAL HIGH (ref 70–99)
Glucose-Capillary: 171 mg/dL — ABNORMAL HIGH (ref 70–99)

## 2020-11-13 LAB — BASIC METABOLIC PANEL
Anion gap: 6 (ref 5–15)
BUN: 22 mg/dL (ref 8–23)
CO2: 25 mmol/L (ref 22–32)
Calcium: 8.3 mg/dL — ABNORMAL LOW (ref 8.9–10.3)
Chloride: 109 mmol/L (ref 98–111)
Creatinine, Ser: 0.98 mg/dL (ref 0.61–1.24)
GFR, Estimated: >60 mL/min (ref >60)
Glucose, Bld: 134 mg/dL — ABNORMAL HIGH (ref 70–99)
Potassium: 3.7 mmol/L (ref 3.5–5.1)
Sodium: 140 mmol/L (ref 135–145)

## 2020-11-13 MED ORDER — SACUBITRIL-VALSARTAN 49-51 MG PO TABS
1.0000 | ORAL_TABLET | Freq: Two times a day (BID) | ORAL | Status: DC
  Administered 2020-11-13 – 2020-11-16 (×7): 1 via ORAL
  Filled 2020-11-13 (×8): qty 1

## 2020-11-13 NOTE — Progress Notes (Signed)
3 Days Post-Op   Subjective/Chief Complaint: Improved pain.  Has been doing physical therapy and exercising the left stump.   Objective: Vital signs in last 24 hours: Temp:  [97.8 F (36.6 C)-98.5 F (36.9 C)] 98.3 F (36.8 C) (04/10 1158) Pulse Rate:  [85-95] 85 (04/10 1158) Resp:  [16-21] 16 (04/10 1158) BP: (98-172)/(72-94) 133/81 (04/10 1158) SpO2:  [93 %-99 %] 99 % (04/10 1158) Last BM Date: 11/09/20  Intake/Output from previous day: 04/09 0701 - 04/10 0700 In: 360 [P.O.:360] Out: 2200 [Urine:2200] Intake/Output this shift: No intake/output data recorded.  General appearance: alert, cooperative and no distress Head: Normocephalic, without obvious abnormality, atraumatic Neck: no adenopathy, no carotid bruit, no JVD, supple, symmetrical, trachea midline and thyroid not enlarged, symmetric, no tenderness/mass/nodules Resp: clear to auscultation bilaterally Extremities: extremities normal, atraumatic, no cyanosis or edema and Left BKA dressing intact.  The dressing was removed and incision examined.  The incision is clean without erytema and dry.  Pulses: Right leg warm with weakly palpable pulses. Left BKA Skin: Skin color, texture, turgor normal. No rashes or lesions Incision/Wound: Left BKA redressed with sterile non-adhesive dressing, Kerlex and ACE wrap.   Lab Results:  Recent Labs    11/11/20 0640 11/12/20 0451  WBC 18.0* 13.8*  HGB 12.2* 11.1*  HCT 37.7* 33.8*  PLT 519* 486*   BMET Recent Labs    11/12/20 0451 11/13/20 0447  NA 137 140  K 3.8 3.7  CL 107 109  CO2 23 25  GLUCOSE 127* 134*  BUN 20 22  CREATININE 0.92 0.98  CALCIUM 8.1* 8.3*   PT/INR No results for input(s): LABPROT, INR in the last 72 hours. ABG No results for input(s): PHART, HCO3 in the last 72 hours.  Invalid input(s): PCO2, PO2  Studies/Results: No results found.  Anti-infectives: Anti-infectives (From admission, onward)   Start     Dose/Rate Route Frequency Ordered  Stop   11/10/20 0600  ceFAZolin (ANCEF) IVPB 2g/100 mL premix        2 g 200 mL/hr over 30 Minutes Intravenous On call to O.R. 11/10/20 0451 11/10/20 1515   11/10/20 0000  Ampicillin-Sulbactam (UNASYN) 3 g in sodium chloride 0.9 % 100 mL IVPB        3 g 200 mL/hr over 30 Minutes Intravenous Every 6 hours 11/09/20 1804 11/12/20 0951   11/07/20 1800  vancomycin (VANCOREADY) IVPB 1250 mg/250 mL        1,250 mg 166.7 mL/hr over 90 Minutes Intravenous Every 24 hours 11/06/20 1811 11/12/20 1000   11/07/20 0130  metroNIDAZOLE (FLAGYL) IVPB 500 mg  Status:  Discontinued        500 mg 100 mL/hr over 60 Minutes Intravenous Every 8 hours 11/07/20 0127 11/09/20 1804   11/06/20 2300  ceFEPIme (MAXIPIME) 2 g in sodium chloride 0.9 % 100 mL IVPB  Status:  Discontinued        2 g 200 mL/hr over 30 Minutes Intravenous Every 8 hours 11/06/20 1805 11/09/20 1804   11/06/20 2000  vancomycin (VANCOREADY) IVPB 1500 mg/300 mL        1,500 mg 150 mL/hr over 120 Minutes Intravenous  Once 11/06/20 1805 11/07/20 0950   11/06/20 1830  metroNIDAZOLE (FLAGYL) IVPB 500 mg  Status:  Discontinued        500 mg 100 mL/hr over 60 Minutes Intravenous Every 8 hours 11/06/20 1825 11/07/20 0127   11/06/20 1700  vancomycin (VANCOCIN) IVPB 1000 mg/200 mL premix  1,000 mg 200 mL/hr over 60 Minutes Intravenous  Once 11/06/20 1645 11/07/20 0700   11/06/20 1700  ceFEPIme (MAXIPIME) 1 g in sodium chloride 0.9 % 100 mL IVPB        1 g 200 mL/hr over 30 Minutes Intravenous  Once 11/06/20 1645 11/06/20 1727      Assessment/Plan: s/p Procedure(s): AMPUTATION BELOW KNEE (Left) Discharge When patient is comfortable to go to rehabilitation.   Return to the Vascular Clinic in 1-2 weeks to assess wound progress.   LOS: 7 days    Louisa Second 11/13/2020

## 2020-11-13 NOTE — Progress Notes (Signed)
Physical Therapy Treatment Patient Details Name: Danny SUDER Sr. MRN: [id number] DOB: March 22, 1951 Today's Date: 11/13/2020    History of Present Illness Danny [name]  is a 70 y.o. male with a known history of CAD, CHF, COPD, type 2 diabetes with hyperlipidemia came to the ER with worsening pain in his foot.  He has been followed as outpatient by Dr. Alberteen Spindle podiatry seen first on 10/21/2020.  He had gangrene of the toe at that time.  He was referred to vascular surgery for an angiogram.  On 10/31/2000 Dr. Wyn Quaker did an angiogram and had 3 angioplasties and 1 stent placed.  With patient having worsening foot pain he came back to the hospital.  Pain 7 out of 10 in intensity.  He has been having trouble thinking straight.  His white blood cell count was elevated and he was tachycardic and still has gangrene.  Hospitalist services were contacted for admission for sepsis. Pt underwent L BKA on 11/10/20. PMH: CAD, chronic combined systolic & diastolic CHF, CKD stage 3, COPD, DM2, HLD, HTN, ischemic cardiomyopathy    PT Comments    Patient agreeable to PT. Patient reports he has 0/10 pain at rest, increasing to 3/10 in residual limb with activity. Patient performed strengthening exercises with LLE in sidelying and seated position. Educated patient on importance of self stretching, positioning, and desensitization techniques for residual limb. Patient continues to require assistance for bed mobility and incremental scooting transfers, limited by fatigue with activity with Sp02 94% on room air. . Patient refused to attempt sit to stand transfers due to feeling too weak and expressed fear of falling. Recommend to continue PT to maximize independence with SNF recommended at discharge for ongoing PT efforts.    Follow Up Recommendations  SNF     Equipment Recommendations  None recommended by PT    Recommendations for Other Services       Precautions / Restrictions Precautions Precautions:  Fall Restrictions Weight Bearing Restrictions: Yes LLE Weight Bearing: Non weight bearing    Mobility  Bed Mobility Overal bed mobility: Needs Assistance Bed Mobility: Supine to Sit     Supine to sit: Min assist;HOB elevated Sit to supine: Min guard;HOB elevated   General bed mobility comments: verbal cues for technique. assistance for trunk support to sit upright.    Transfers Overall transfer level: Needs assistance   Transfers: Lateral/Scoot Transfers          Lateral/Scoot Transfers: Min assist General transfer comment: patient performed incremental lateral scooting transfer with BUE and RLE spport and cues for technique. performed x 2 bouts (5 scoots and then 3 scoots) with rest break between bouts of scooting. patient refused multiple times to attempt sit to stand transfer due to feeling "too weak" to attempt.  Ambulation/Gait                 Stairs             Wheelchair Mobility    Modified Rankin (Stroke Patients Only)       Balance Overall balance assessment: Needs assistance Sitting-balance support: Bilateral upper extremity supported Sitting balance-[name]: Good                                      Cognition Arousal/Alertness: Awake/alert Behavior During Therapy: WFL for tasks assessed/performed Overall Cognitive Status: Within Functional Limits for tasks assessed  General Comments: patient able to follow single step commands without difficulty during this session.      Exercises Amputee Exercises Quad Sets: AROM;Strengthening;Left;5 reps Hip Extension: AAROM;Strengthening;Left;10 reps;Sidelying (right sidelying position) Hip ABduction/ADduction: AAROM;Strengthening;Left;10 reps;Sidelying (right sidelying position) Knee Extension: AROM;Strengthening;Left;10 reps;Seated Other Exercises Other Exercises: verbal cues for exercise technique and ROM techniques. encouraged  left knee extension and self stretching. also educated patient on desensitization techniques for residual limb    General Comments        Pertinent Vitals/Pain Pain Assessment: 0-10 Pain Score: 3  Pain Location: L residual limb Pain Descriptors / Indicators: Discomfort Pain Intervention(s): Repositioned;Monitored during session    Home Living                      Prior Function            PT Goals (current goals can now be found in the care plan section) Acute Rehab PT Goals Patient Stated Goal: to get to rehab PT Goal Formulation: With patient Time For Goal Achievement: 11/25/20 Potential to Achieve Goals: Fair Progress towards PT goals: Progressing toward goals    Frequency    7X/week      PT Plan Current plan remains appropriate    Co-evaluation              AM-PAC PT "6 Clicks" Mobility   Outcome Measure  Help needed turning from your back to your side while in a flat bed without using bedrails?: A Little Help needed moving from lying on your back to sitting on the side of a flat bed without using bedrails?: A Lot Help needed moving to and from a bed to a chair (including a wheelchair)?: Total Help needed standing up from a chair using your arms (e.g., wheelchair or bedside chair)?: Total Help needed to walk in hospital room?: Total Help needed climbing 3-5 steps with a railing? : Total 6 Click Score: 9    End of Session   Activity Tolerance: Patient limited by fatigue Patient left: in bed;with call bell/phone within reach;with bed alarm set Nurse Communication: Mobility status PT Visit Diagnosis: Difficulty in walking, not elsewhere classified (R26.2);Muscle weakness (generalized) (M62.81);Pain Pain - Right/Left: Left Pain - part of body: Leg     Time: 0932-6712 PT Time Calculation (min) (ACUTE ONLY): 32 min  Charges:  $Therapeutic Exercise: 8-22 mins $Therapeutic Activity: 8-22 mins                     Donna Bernard, PT,  MPT   Ina Homes 11/13/2020, 1:54 PM

## 2020-11-13 NOTE — Progress Notes (Addendum)
Progress Note  Patient Name: Danny [name] Sr. Date of Encounter: 11/13/2020  Primary Cardiologist: Julien Nordmann, MD  Subjective   Leg hurts this AM.  No chest pain or sob.  Abd feels better/less bloated after bowel mvmts yesterday.  Inpatient Medications    Scheduled Meds: . aspirin EC  81 mg Oral Daily  . atorvastatin  80 mg Oral QHS  . carvedilol  25 mg Oral BID WC  . enoxaparin (LOVENOX) injection  0.5 mg/kg Subcutaneous Q24H  . escitalopram  10 mg Oral Daily  . ezetimibe  10 mg Oral Daily  . furosemide  20 mg Oral Daily  . insulin aspart  0-15 Units Subcutaneous TID WC  . insulin aspart  0-5 Units Subcutaneous QHS  . insulin glargine  25 Units Subcutaneous Daily  . isosorbide mononitrate  30 mg Oral Daily  . potassium chloride  10 mEq Oral Daily  . sacubitril-valsartan  1 tablet Oral BID  . senna-docusate  2 tablet Oral BID  . spironolactone  25 mg Oral Daily  . tamsulosin  0.4 mg Oral Daily   Continuous Infusions:  PRN Meds: acetaminophen **OR** acetaminophen, baclofen, ipratropium-albuterol, morphine injection, nitroGLYCERIN, ondansetron **OR** ondansetron (ZOFRAN) IV, oxyCODONE-acetaminophen   Vital Signs    Vitals:   11/12/20 1951 11/13/20 0016 11/13/20 0624 11/13/20 0810  BP: (!) 172/94 (!) 167/91 (!) 158/89 (!) 149/78  Pulse: 85 90 88 88  Resp: 18 18 18 20   Temp: 98.1 F (36.7 C) 97.8 F (36.6 C) 98 F (36.7 C) 98.5 F (36.9 C)  TempSrc: Oral Oral Oral Oral  SpO2: 98% 95%  93%  Weight:      Height:        Intake/Output Summary (Last 24 hours) at 11/13/2020 0934 Last data filed at 11/13/2020 01/13/2021 Gross per 24 hour  Intake 360 ml  Output 2200 ml  Net -1840 ml   Filed Weights   11/06/20 1536 11/11/20 0452  Weight: 100 kg 100.9 kg    Physical Exam   GEN: Well nourished, well developed, in no acute distress.  HEENT: Grossly normal.  Neck: Supple, no JVD, carotid bruits, or masses. Cardiac: RRR, no murmurs, rubs, or gallops. No clubbing,  cyanosis, edema.  Radials 2+, L BKA. Respiratory:  Respirations regular and unlabored, clear to auscultation bilaterally. GI: Soft, nontender, nondistended, BS + x 4. MS: no deformity or atrophy. Skin: warm and dry, no rash. Neuro:  Strength and sensation are intact. Psych: AAOx3.  Normal affect.  Labs    Chemistry Recent Labs  Lab 11/06/20 1541 11/07/20 0847 11/09/20 0509 11/10/20 0507 11/11/20 0640 11/12/20 0451 11/13/20 0447  NA 135   < > 136   < > 137 137 140  K 3.9   < > 3.9   < > 3.9 3.8 3.7  CL 102   < > 107   < > 108 107 109  CO2 21*   < > 22   < > 21* 23 25  GLUCOSE 212*   < > 114*   < > 148* 127* 134*  BUN 23   < > 26*   < > 22 20 22   CREATININE 1.27*   < > 1.13   < > 1.14 0.92 0.98  CALCIUM 8.6*   < > 8.5*   < > 8.2* 8.1* 8.3*  PROT 7.0  --  6.5  --   --   --   --   ALBUMIN 3.0*  --  2.4*  --   --   --   --  AST 74*  --  46*  --   --   --   --   ALT 99*  --  60*  --   --   --   --   ALKPHOS 129*  --  120  --   --   --   --   BILITOT 0.8  --  0.8  --   --   --   --   GFRNONAA >60   < > >60   < > >60 >60 >60  ANIONGAP 12   < > 7   < > 8 7 6    < > = values in this interval not displayed.     Hematology Recent Labs  Lab 11/10/20 0507 11/11/20 0640 11/12/20 0451  WBC 19.1* 18.0* 13.8*  RBC 4.30 4.07* 3.61*  HGB 12.6* 12.2* 11.1*  HCT 40.1 37.7* 33.8*  MCV 93.3 92.6 93.6  MCH 29.3 30.0 30.7  MCHC 31.4 32.4 32.8  RDW 13.0 13.1 13.1  PLT 479* 519* 486*   Lipids  Lab Results  Component Value Date   CHOL 96 10/26/2020   HDL 46 10/26/2020   LDLCALC 38 10/26/2020   LDLDIRECT 64.0 03/20/2018   TRIG 60 10/26/2020   CHOLHDL 2.1 10/26/2020    HbA1c  Lab Results  Component Value Date   HGBA1C 8.4 (H) 09/30/2020    Radiology    No results found.  Telemetry    Not on tele - Personally Reviewed  Cardiac Studies   2D Echocardiogram4.5.2022  1. Left ventricular ejection fraction, by estimation, is 35 to 40%. The  left ventricle has  moderately decreased function. The left ventricle  demonstrates global hypokinesis. The left ventricular internal cavity size  was mildly dilated. Left ventricular  diastolic parameters are consistent with Grade I diastolic dysfunction  (impaired relaxation).  2. Right ventricular systolic function is normal. The right ventricular  size is normal. Tricuspid regurgitation signal is inadequate for assessing  PA pressure.  3. The mitral valve is grossly normal. Trivial mitral valve  regurgitation. No evidence of mitral stenosis.  4. The aortic valve was not well visualized. Aortic valve regurgitation  is not visualized. No aortic stenosis is present.  5. The inferior vena cava is normal in size with <50% respiratory  variability, suggesting right atrial pressure of 8 mmHg.    Patient Profile     70 y.o.malewith a hx of CAD s/p LAD stenting, ICM, chronic combined CHF, stage 3 CKD,PAD,COPD, remote tobacco use, DM2, HTN, HLD, ED, andobesity, who was admitted 4/3 w/ worsening L foot ischemia/gangrene, now s/p L BKA.  Preop echo w/ recurrent LV dysfxn (EF 35-40%).  Assessment & Plan    1.  L LE ischemia/gangrene:  S/p L BKA.  Pain mgmt per vasc surgery.  Pending SNF.  2.  Sepsis/leukocytosis:  Hemodynamically stable.  WBC trending down yesterday.  Off abx.  3.  HFrEF/ICM:  He has prior h/o ICM/HFrEF w/ EF previously 30% in 07/2016 followed by improvement on echo 03/2017 (55-60%). Echo performed preoperatively this admission w/ finding of recurrent LV dysfxn and EF of 35-40%, Gr1 diast dysfxn, and global HK. His speech is somewhat labored this AM.  Noted to have inc WOB on 4/9 and was given IV lasix.  Improved this AM and minus 1.8L overnight.  Lungs clear today.  Cont PO lasix and spiro.  Cont  blocker.  Changing losartan to entresto in setting of LV dysfxn and ongoing HTN.  Will need ischemic w/u as  outpt following full recovery from BKA.  Consider SGLT2i in near future.  4.   Essential HTN:  One low BP yesterday in the 90's but has otw been trending 140s to 170s.  Cont  blocker, nitrate, spiro, lasix.  Changing losartan to entresto today.  5.  CAD:  S/p prior PCI  LAD.  No c/p. With recurrent LV dysfxn, will need outpt ischemic eval following full recovery from L BKA.  Cont asa, statin,  blocker, nitrate.  6.  HL:  LDL 38. Cont statin rx.  7.  CKD III:  Creat nl.  8.  DMII:  A1c 8.4.  Per IM.  If pressures, renal fxn stable tomorrow, consider dapagliflozin.  Signed, Nicolasa Ducking, NP  11/13/2020, 9:34 AM    For questions or updates, please contact   Please consult [email or web address] for contact info under Cardiology/STEMI.   (As above) agree with Hx and PE Lungs clear BP elevated  Continue Guideline directed medical therapy with entresto perhaps augmenting antihypertensive Rx Will need cath, continue statins and nitrates and ASA with BB

## 2020-11-13 NOTE — Progress Notes (Signed)
PROGRESS NOTE    Danny JOCSON Sr.  ZSW:[id number] DOB: Jul 26, 1951 DOA: 11/06/2020 PCP: [name], MD   Follow-up on left foot gangrene. Brief Narrative:  Patient admitted 11/06/2020 with clinical sepsis and gangrene of the left first toe and starting of the second toe and also gangrene going down the plantar surface of the foot. Patient had leukocytosis and tachycardia. Patient has been following with podiatry as outpatient and also had a recent angiogram by vascular surgery where they did 3 angioplasties and one stent. Patient seen in consultation by both podiatry and vascular surgery and he will be set up for a BKA on Thursday. Past medical history type 2 diabetes mellitus, hyperlipidemia, CAD, chronic diastolic CHF, COPD, essential hypertension and chronic kidney disease stage II Left lower extremity BKA on 4/7.  Antibiotics completed on 4/9.   Assessment & Plan:   Active Problems:   Type 2 diabetes mellitus with hyperlipidemia (HCC)   Essential hypertension   COPD with chronic bronchitis (HCC)   Sepsis (HCC)   Gangrene of left foot (HCC)   Stage 2 chronic kidney disease   Thrush   Cellulitis of left foot   HFrEF (heart failure with reduced ejection fraction) (HCC)   Chronic systolic heart failure (HCC)  #1. Sepsis.  Left foot cellulitis. POA  Left foot gangrene in the first, second and third toe. Status left BKA. Peripheral vascular disease. Patient doing well post surgery, no bleeding from stump.  Continue physical therapy/Occupational Therapy.  Pending nursing home placement.  2.  Essential hypertension. Chronic systolic congestive heart failure. Followed by cardiology, no volume overload.  3.  Uncontrolled type 2 diabetes with hyperglycemia No need to adjust insulin dose today.  4.  Reactive thrombocytosis Improving.     DVT prophylaxis: Lovenox Code Status: Full Family Communication:  Disposition Plan:  .   Status is: Inpatient  Remains  inpatient appropriate because:Inpatient level of care appropriate due to severity of illness   Dispo: The patient is from: Home              Anticipated d/c is to: SNF              Patient currently is not medically stable to d/c.   Difficult to place patient No        I/O last 3 completed shifts: In: 720 [P.O.:720] Out: 2200 [Urine:2200] No intake/output data recorded.     Consultants:   Vascular surgery and cardiology  Procedures: BKA  Antimicrobials: None  Subjective: Patient doing well today, not short of breath.  Slept well overnight. Denies any chest pain or palpitation. No fever chills. No dysuria hematuria. No abdominal pain or nausea vomiting.  Had a very large bowel movement today.  Objective: Vitals:   11/13/20 0016 11/13/20 0624 11/13/20 0810 11/13/20 1158  BP: (!) 167/91 (!) 158/89 (!) 149/78 133/81  Pulse: 90 88 88 85  Resp: 18 18 20 16   Temp: 97.8 F (36.6 C) 98 F (36.7 C) 98.5 F (36.9 C) 98.3 F (36.8 C)  TempSrc: Oral Oral Oral Oral  SpO2: 95%  93% 99%  Weight:      Height:        Intake/Output Summary (Last 24 hours) at 11/13/2020 1258 Last data filed at 11/13/2020 0620 Gross per 24 hour  Intake 360 ml  Output 1500 ml  Net -1140 ml   Filed Weights   11/06/20 1536 11/11/20 0452  Weight: 100 kg 100.9 kg    Examination:  General exam:  Appears calm and comfortable  Respiratory system: Clear to auscultation. Respiratory effort normal. Cardiovascular system: S1 & S2 heard, RRR. No JVD, murmurs, rubs, gallops or clicks. No pedal edema. Gastrointestinal system: Abdomen is nondistended, soft and nontender. No organomegaly or masses felt. Normal bowel sounds heard. Central nervous system: Alert and oriented x2. No focal neurological deficits. Extremities: Left BKA. Skin: No rashes, lesions or ulcers Psychiatry: Judgement and insight appear normal. Mood & affect appropriate.     Data Reviewed: I have personally reviewed following  labs and imaging studies  CBC: Recent Labs  Lab 11/06/20 1541 11/08/20 0435 11/10/20 0507 11/11/20 0640 11/12/20 0451  WBC 24.4* 21.6* 19.1* 18.0* 13.8*  NEUTROABS 20.6*  --   --  14.3* 9.8*  HGB 13.5 12.3* 12.6* 12.2* 11.1*  HCT 42.3 37.8* 40.1 37.7* 33.8*  MCV 93.2 92.9 93.3 92.6 93.6  PLT 407* 378 479* 519* 486*   Basic Metabolic Panel: Recent Labs  Lab 11/09/20 0509 11/10/20 0507 11/11/20 0640 11/12/20 0451 11/13/20 0447  NA 136 137 137 137 140  K 3.9 4.3 3.9 3.8 3.7  CL 107 107 108 107 109  CO2 22 23 21* 23 25  GLUCOSE 114* 133* 148* 127* 134*  BUN 26* 29* 22 20 22   CREATININE 1.13 1.32* 1.14 0.92 0.98  CALCIUM 8.5* 8.5* 8.2* 8.1* 8.3*  MG  --  2.1 1.9  --   --    GFR: Estimated Creatinine Clearance: 81.9 mL/min (by C-G formula based on SCr of 0.98 mg/dL). Liver Function Tests: Recent Labs  Lab 11/06/20 1541 11/09/20 0509  AST 74* 46*  ALT 99* 60*  ALKPHOS 129* 120  BILITOT 0.8 0.8  PROT 7.0 6.5  ALBUMIN 3.0* 2.4*   No results for input(s): LIPASE, AMYLASE in the last 168 hours. No results for input(s): AMMONIA in the last 168 hours. Coagulation Profile: Recent Labs  Lab 11/10/20 0507  INR 1.3*   Cardiac Enzymes: No results for input(s): CKTOTAL, CKMB, CKMBINDEX, TROPONINI in the last 168 hours. BNP (last 3 results) No results for input(s): PROBNP in the last 8760 hours. HbA1C: No results for input(s): HGBA1C in the last 72 hours. CBG: Recent Labs  Lab 11/12/20 1219 11/12/20 1545 11/12/20 1956 11/13/20 0810 11/13/20 1143  GLUCAP 222* 171* 151* 230* 243*   Lipid Profile: No results for input(s): CHOL, HDL, LDLCALC, TRIG, CHOLHDL, LDLDIRECT in the last 72 hours. Thyroid Function Tests: No results for input(s): TSH, T4TOTAL, FREET4, T3FREE, THYROIDAB in the last 72 hours. Anemia Panel: No results for input(s): VITAMINB12, FOLATE, FERRITIN, TIBC, IRON, RETICCTPCT in the last 72 hours. Sepsis Labs: Recent Labs  Lab 11/06/20 1541  11/06/20 2007  LATICACIDVEN 1.7 1.1    Recent Results (from the past 240 hour(s))  Culture, blood (routine x 2)     Status: None   Collection Time: 11/06/20  3:14 PM   Specimen: BLOOD  Result Value Ref Range Status   Specimen Description BLOOD  RT [facility]  Final   Special Requests   Final    BOTTLES DRAWN AEROBIC AND ANAEROBIC Blood Culture adequate volume   Culture   Final    NO GROWTH 5 DAYS Performed at [facility], [address], [place], [place]    Report Status 11/11/2020 FINAL  Final  Culture, blood (routine x 2)     Status: None   Collection Time: 11/06/20  3:41 PM   Specimen: BLOOD  Result Value Ref Range Status   Specimen Description BLOOD  RT [facility]  Final   Special Requests   Final    BOTTLES DRAWN AEROBIC AND ANAEROBIC Blood Culture adequate volume   Culture   Final    NO GROWTH 5 DAYS Performed at [facility], [address] [place], [place] [address]    Report Status 11/11/2020 FINAL  Final  SARS CORONAVIRUS 2 (TAT 6-24 HRS) Nasopharyngeal Nasopharyngeal Swab     Status: None   Collection Time: 11/06/20 11:35 PM   Specimen: Nasopharyngeal Swab  Result Value Ref Range Status   SARS Coronavirus 2 NEGATIVE NEGATIVE Final    Comment: (NOTE) SARS-CoV-2 target nucleic acids are NOT DETECTED.  The SARS-CoV-2 RNA is generally detectable in upper and lower respiratory specimens during the acute phase of infection. Negative results do not preclude SARS-CoV-2 infection, do not rule out co-infections with other pathogens, and should not be used as the sole basis for treatment or other patient management decisions. Negative results must be combined with clinical observations, patient history, and epidemiological information. The expected result is Negative.  Fact Sheet for Patients: HairSlick.no  Fact Sheet for Healthcare Providers: quierodirigir.com  This test is not yet approved  or cleared by the [place] FDA and  has been authorized for detection and/or diagnosis of SARS-CoV-2 by FDA under an Emergency Use Authorization (EUA). This EUA will remain  in effect (meaning this test can be used) for the duration of the COVID-19 declaration under Se ction 564(b)(1) of the Act, 21 U.S.C. section 360bbb-3(b)(1), unless the authorization is terminated or revoked sooner.  Performed at [facility], [address], [place], [place] [address]          Radiology Studies: No results found.      Scheduled Meds: . aspirin EC  81 mg Oral Daily  . atorvastatin  80 mg Oral QHS  . carvedilol  25 mg Oral BID WC  . enoxaparin (LOVENOX) injection  0.5 mg/kg Subcutaneous Q24H  . escitalopram  10 mg Oral Daily  . ezetimibe  10 mg Oral Daily  . furosemide  20 mg Oral Daily  . insulin aspart  0-15 Units Subcutaneous TID WC  . insulin aspart  0-5 Units Subcutaneous QHS  . insulin glargine  25 Units Subcutaneous Daily  . isosorbide mononitrate  30 mg Oral Daily  . potassium chloride  10 mEq Oral Daily  . sacubitril-valsartan  1 tablet Oral BID  . senna-docusate  2 tablet Oral BID  . spironolactone  25 mg Oral Daily  . tamsulosin  0.4 mg Oral Daily   Continuous Infusions:   LOS: 7 days    Time spent: 27 minutes    Marrion Coy, MD [facility]   To contact the attending provider between 7A-7P or the covering provider during after hours 7P-7A, please log into the web site [email or web address] and access using universal [place] password for that web site. If you do not have the password, please call the hospital operator.  11/13/2020, 12:58 PM

## 2020-11-14 DIAGNOSIS — D75838 Other thrombocytosis: Secondary | ICD-10-CM

## 2020-11-14 DIAGNOSIS — L03116 Cellulitis of left lower limb: Secondary | ICD-10-CM | POA: Diagnosis not present

## 2020-11-14 DIAGNOSIS — I5022 Chronic systolic (congestive) heart failure: Secondary | ICD-10-CM | POA: Diagnosis not present

## 2020-11-14 DIAGNOSIS — I96 Gangrene, not elsewhere classified: Secondary | ICD-10-CM | POA: Diagnosis not present

## 2020-11-14 LAB — GLUCOSE, CAPILLARY
Glucose-Capillary: 175 mg/dL — ABNORMAL HIGH (ref 70–99)
Glucose-Capillary: 253 mg/dL — ABNORMAL HIGH (ref 70–99)
Glucose-Capillary: 162 mg/dL — ABNORMAL HIGH (ref 70–99)

## 2020-11-14 LAB — SURGICAL PATHOLOGY

## 2020-11-14 MED ORDER — INSULIN ASPART 100 UNIT/ML ~~LOC~~ SOLN
4.0000 [IU] | Freq: Three times a day (TID) | SUBCUTANEOUS | Status: DC
  Administered 2020-11-14 – 2020-11-16 (×5): 4 [IU] via SUBCUTANEOUS
  Filled 2020-11-14 (×5): qty 1

## 2020-11-14 NOTE — Progress Notes (Signed)
Physical Therapy Treatment Patient Details Name: Danny BRAY Sr. MRN: [id number] DOB: 08/16/1950 Today's Date: 11/14/2020    History of Present Illness Danny [name]  is a 70 y.o. male with a known history of CAD, CHF, COPD, type 2 diabetes with hyperlipidemia came to the ER with worsening pain in his foot.  He has been followed as outpatient by Dr. Alberteen Spindle podiatry seen first on 10/21/2020.  He had gangrene of the toe at that time.  He was referred to vascular surgery for an angiogram.  On 10/31/2000 Dr. Wyn Quaker did an angiogram and had 3 angioplasties and 1 stent placed.  With patient having worsening foot pain he came back to the hospital.  Pain 7 out of 10 in intensity.  He has been having trouble thinking straight.  His white blood cell count was elevated and he was tachycardic and still has gangrene.  Hospitalist services were contacted for admission for sepsis. Pt underwent L BKA on 11/10/20. PMH: CAD, chronic combined systolic & diastolic CHF, CKD stage 3, COPD, DM2, HLD, HTN, ischemic cardiomyopathy    PT Comments    Pt is making gradual progress towards goals with ability to attempt 2 standing attempts, however unable to fully stand upright. Good endurance with there-ex and positioned L knee with pillow at end of session to promote terminal extension. Will continue to progress as able.   Follow Up Recommendations  SNF     Equipment Recommendations  None recommended by PT    Recommendations for Other Services       Precautions / Restrictions Precautions Precautions: Fall Restrictions Weight Bearing Restrictions: Yes LLE Weight Bearing: Non weight bearing Other Position/Activity Restrictions: BKA on 11/10/20    Mobility  Bed Mobility Overal bed mobility: Needs Assistance Bed Mobility: Supine to Sit     Supine to sit: Min assist;HOB elevated Sit to supine: Min guard;HOB elevated   General bed mobility comments: safe technique with upright posture noted. Slight dizziness noted  initially at EOB, however improves with time. Upon returning back supine, able to reposition self in bed.    Transfers Overall transfer level: Needs assistance Equipment used: Rolling walker (2 wheeled) Transfers: Sit to/from Stand Sit to Stand: Max assist         General transfer comment: 2 attempts for sit<>Stand. Able to slightly lift buttock off bed, however unable to attain full standing position. Required knee blocking prior to standing attempts  Ambulation/Gait             General Gait Details: unable to perform   Stairs             Wheelchair Mobility    Modified Rankin (Stroke Patients Only)       Balance Overall balance assessment: Needs assistance Sitting-balance support: Bilateral upper extremity supported Sitting balance-[name]: Good                                      Cognition Arousal/Alertness: Awake/alert Behavior During Therapy: WFL for tasks assessed/performed Overall Cognitive Status: Within Functional Limits for tasks assessed                                 General Comments: affect appropriate. A&O x 4      Exercises Other Exercises Other Exercises: education given for positioning as therapist entered with L knee in flexed position;  lacking 19 degrees from extension. Other Exercises: Supine/seated ther-ex performed on B LE including quad sets, hip abd/add, and LAQ. 12 reps. Also performed manual therapy with cross friction massage and stretching to hamstrings on L knee.    General Comments        Pertinent Vitals/Pain Pain Assessment: 0-10 Pain Score: 2  Pain Location: L residual limb Pain Descriptors / Indicators: Discomfort Pain Intervention(s): Limited activity within patient's tolerance    Home Living                      Prior Function            PT Goals (current goals can now be found in the care plan section) Acute Rehab PT Goals Patient Stated Goal: to get to  rehab PT Goal Formulation: With patient Time For Goal Achievement: 11/25/20 Potential to Achieve Goals: Fair Progress towards PT goals: Progressing toward goals    Frequency    7X/week      PT Plan Current plan remains appropriate    Co-evaluation              AM-PAC PT "6 Clicks" Mobility   Outcome Measure  Help needed turning from your back to your side while in a flat bed without using bedrails?: A Little Help needed moving from lying on your back to sitting on the side of a flat bed without using bedrails?: A Little Help needed moving to and from a bed to a chair (including a wheelchair)?: Total Help needed standing up from a chair using your arms (e.g., wheelchair or bedside chair)?: Total Help needed to walk in hospital room?: Total Help needed climbing 3-5 steps with a railing? : Total 6 Click Score: 10    End of Session Equipment Utilized During Treatment: Gait belt Activity Tolerance: Patient limited by fatigue Patient left: in bed;with bed alarm set;with family/visitor present Nurse Communication: Mobility status PT Visit Diagnosis: Difficulty in walking, not elsewhere classified (R26.2);Muscle weakness (generalized) (M62.81);Pain Pain - Right/Left: Left Pain - part of body: Leg     Time: 3016-0109 PT Time Calculation (min) (ACUTE ONLY): 28 min  Charges:  $Therapeutic Exercise: 8-22 mins $Therapeutic Activity: 8-22 mins                     Elizabeth Palau, PT, DPT [phone]    Danny [name] 11/14/2020, 1:15 PM

## 2020-11-14 NOTE — Progress Notes (Signed)
PROGRESS NOTE    Danny FOOKS Sr.  GBT:[id number] DOB: 10/27/1950 DOA: 11/06/2020 PCP: [name], MD    Brief Narrative:  Patient admitted 11/06/2020 with clinical sepsis and gangrene of the left first toe and starting of the second toe and also gangrene going down the plantar surface of the foot. Patient had leukocytosis and tachycardia. Patient has been following with podiatry as outpatient and also had a recent angiogram by vascular surgery where they did 3 angioplasties and one stent. Patient seen in consultation by both podiatry and vascular surgery and he will be set up for a BKA on Thursday. Past medical history type 2 diabetes mellitus, hyperlipidemia, CAD, chronic diastolic CHF, COPD, essential hypertension and chronic kidney disease stage II Left lower extremity BKA on 4/7.  Antibiotics completed on 4/9.   Assessment & Plan:   Active Problems:   Type 2 diabetes mellitus with hyperlipidemia (HCC)   Essential hypertension   COPD with chronic bronchitis (HCC)   Sepsis (HCC)   Gangrene of left foot (HCC)   Stage 2 chronic kidney disease   Thrush   Cellulitis of left foot   HFrEF (heart failure with reduced ejection fraction) (HCC)   Chronic systolic heart failure (HCC)   #1. Sepsis.  Left foot cellulitis. POA  Left foot gangrene in the first, second and third toe. Status left BKA. Peripheral vascular disease. Patient has completed antibiotics, status post BKA.  Currently doing well pending transfer.  2.  Essential hypertension plan Chronic systolic congestive heart failure. Cardiology has been managing blood pressure medicines.  3.  Uncontrolled type 2 diabetes with hyperglycemia Glucose running high as patient appetite improving, add scheduled NovoLog in addition to Lantus and sliding scale insulin.   DVT prophylaxis: Lovenox Code Status: Full Family Communication:  Disposition Plan:  .   Status is: Inpatient  Remains inpatient appropriate  because:Unsafe d/c plan   Dispo: The patient is from: Home              Anticipated d/c is to: SNF              Patient currently is medically stable to d/c.   Difficult to place patient No        I/O last 3 completed shifts: In: 480 [P.O.:480] Out: 3100 [Urine:3100] Total I/O In: -  Out: 650 [Urine:650]     Consultants:   Card, vascular  Procedures: BKA  Antimicrobials: None  Subjective: Patient doing well today, slept well last night.  Does not have any short of breath or cough. No abdominal pain nausea vomiting. Had a large bowel movement today. No dysuria hematuria. No fever or chills.  Objective: Vitals:   11/13/20 2042 11/14/20 0512 11/14/20 0735 11/14/20 0900  BP: (!) 157/91 (!) 171/99 (!) 186/101   Pulse: 83 83 83 96  Resp: (!) 22 19 16    Temp: 98.2 F (36.8 C) 98.1 F (36.7 C) 98.2 F (36.8 C) 98 F (36.7 C)  TempSrc: Oral Oral  Oral  SpO2: 97% 95% 96% 95%  Weight:      Height:        Intake/Output Summary (Last 24 hours) at 11/14/2020 1120 Last data filed at 11/14/2020 0900 Gross per 24 hour  Intake 480 ml  Output 2250 ml  Net -1770 ml   Filed Weights   11/06/20 1536 11/11/20 0452  Weight: 100 kg 100.9 kg    Examination:  General exam: Appears calm and comfortable  Respiratory system: Clear to auscultation. Respiratory  effort normal. Cardiovascular system: S1 & S2 heard, RRR. No JVD, murmurs, rubs, gallops or clicks. No pedal edema. Gastrointestinal system: Abdomen is nondistended, soft and nontender. No organomegaly or masses felt. Normal bowel sounds heard. Central nervous system: Alert and oriented. No focal neurological deficits. Extremities: Left BKA Skin: No rashes, lesions or ulcers Psychiatry: Judgement and insight appear normal. Mood & affect appropriate.     Data Reviewed: I have personally reviewed following labs and imaging studies  CBC: Recent Labs  Lab 11/08/20 0435 11/10/20 0507 11/11/20 0640 11/12/20 0451   WBC 21.6* 19.1* 18.0* 13.8*  NEUTROABS  --   --  14.3* 9.8*  HGB 12.3* 12.6* 12.2* 11.1*  HCT 37.8* 40.1 37.7* 33.8*  MCV 92.9 93.3 92.6 93.6  PLT 378 479* 519* 486*   Basic Metabolic Panel: Recent Labs  Lab 11/09/20 0509 11/10/20 0507 11/11/20 0640 11/12/20 0451 11/13/20 0447  NA 136 137 137 137 140  K 3.9 4.3 3.9 3.8 3.7  CL 107 107 108 107 109  CO2 22 23 21* 23 25  GLUCOSE 114* 133* 148* 127* 134*  BUN 26* 29* 22 20 22   CREATININE 1.13 1.32* 1.14 0.92 0.98  CALCIUM 8.5* 8.5* 8.2* 8.1* 8.3*  MG  --  2.1 1.9  --   --    GFR: Estimated Creatinine Clearance: 81.9 mL/min (by C-G formula based on SCr of 0.98 mg/dL). Liver Function Tests: Recent Labs  Lab 11/09/20 0509  AST 46*  ALT 60*  ALKPHOS 120  BILITOT 0.8  PROT 6.5  ALBUMIN 2.4*   No results for input(s): LIPASE, AMYLASE in the last 168 hours. No results for input(s): AMMONIA in the last 168 hours. Coagulation Profile: Recent Labs  Lab 11/10/20 0507  INR 1.3*   Cardiac Enzymes: No results for input(s): CKTOTAL, CKMB, CKMBINDEX, TROPONINI in the last 168 hours. BNP (last 3 results) No results for input(s): PROBNP in the last 8760 hours. HbA1C: No results for input(s): HGBA1C in the last 72 hours. CBG: Recent Labs  Lab 11/13/20 1143 11/13/20 1635 11/13/20 1757 11/13/20 2039 11/14/20 0734  GLUCAP 243* 234* 299* 171* 175*   Lipid Profile: No results for input(s): CHOL, HDL, LDLCALC, TRIG, CHOLHDL, LDLDIRECT in the last 72 hours. Thyroid Function Tests: No results for input(s): TSH, T4TOTAL, FREET4, T3FREE, THYROIDAB in the last 72 hours. Anemia Panel: No results for input(s): VITAMINB12, FOLATE, FERRITIN, TIBC, IRON, RETICCTPCT in the last 72 hours. Sepsis Labs: No results for input(s): PROCALCITON, LATICACIDVEN in the last 168 hours.  Recent Results (from the past 240 hour(s))  Culture, blood (routine x 2)     Status: None   Collection Time: 11/06/20  3:14 PM   Specimen: BLOOD  Result Value  Ref Range Status   Specimen Description BLOOD  RT [facility]  Final   Special Requests   Final    BOTTLES DRAWN AEROBIC AND ANAEROBIC Blood Culture adequate volume   Culture   Final    NO GROWTH 5 DAYS Performed at [facility], [address], [place], [place]    Report Status 11/11/2020 FINAL  Final  Culture, blood (routine x 2)     Status: None   Collection Time: 11/06/20  3:41 PM   Specimen: BLOOD  Result Value Ref Range Status   Specimen Description BLOOD  RT FOREARM  Final   Special Requests   Final    BOTTLES DRAWN AEROBIC AND ANAEROBIC Blood Culture adequate volume   Culture   Final  NO GROWTH 5 DAYS Performed at [facility], [address] [place] [place], [place] [address]    Report Status 11/11/2020 FINAL  Final  SARS CORONAVIRUS 2 (TAT 6-24 HRS) Nasopharyngeal Nasopharyngeal Swab     Status: None   Collection Time: 11/06/20 11:35 PM   Specimen: Nasopharyngeal Swab  Result Value Ref Range Status   SARS Coronavirus 2 NEGATIVE NEGATIVE Final    Comment: (NOTE) SARS-CoV-2 target nucleic acids are NOT DETECTED.  The SARS-CoV-2 RNA is generally detectable in upper and lower respiratory specimens during the acute phase of infection. Negative results do not preclude SARS-CoV-2 infection, do not rule out co-infections with other pathogens, and should not be used as the sole basis for treatment or other patient management decisions. Negative results must be combined with clinical observations, patient history, and epidemiological information. The expected result is Negative.  Fact Sheet for Patients: HairSlick.no  Fact Sheet for Healthcare Providers: quierodirigir.com  This test is not yet approved or cleared by the [place] FDA and  has been authorized for detection and/or diagnosis of SARS-CoV-2 by FDA under an Emergency Use Authorization (EUA). This EUA will remain  in effect (meaning this  test can be used) for the duration of the COVID-19 declaration under Se ction 564(b)(1) of the Act, 21 U.S.C. section 360bbb-3(b)(1), unless the authorization is terminated or revoked sooner.  Performed at [facility], [address], [place], [place] [address]          Radiology Studies: No results found.      Scheduled Meds: . aspirin EC  81 mg Oral Daily  . atorvastatin  80 mg Oral QHS  . carvedilol  25 mg Oral BID WC  . enoxaparin (LOVENOX) injection  0.5 mg/kg Subcutaneous Q24H  . escitalopram  10 mg Oral Daily  . ezetimibe  10 mg Oral Daily  . furosemide  20 mg Oral Daily  . insulin aspart  0-15 Units Subcutaneous TID WC  . insulin aspart  0-5 Units Subcutaneous QHS  . insulin glargine  25 Units Subcutaneous Daily  . isosorbide mononitrate  30 mg Oral Daily  . potassium chloride  10 mEq Oral Daily  . sacubitril-valsartan  1 tablet Oral BID  . senna-docusate  2 tablet Oral BID  . spironolactone  25 mg Oral Daily  . tamsulosin  0.4 mg Oral Daily   Continuous Infusions:   LOS: 8 days    Time spent: 24 minutes    Marrion Coy, MD [facility]   To contact the attending provider between 7A-7P or the covering provider during after hours 7P-7A, please log into the web site [email or web address] and access using universal [place] password for that web site. If you do not have the password, please call the hospital operator.  11/14/2020, 11:20 AM

## 2020-11-14 NOTE — Progress Notes (Signed)
Occupational Therapy Treatment Patient Details Name: Danny HOLDREN Sr. MRN: [id number] DOB: [birth year] Today's Date: 11/14/2020    History of present illness Danny [name]  is a 70 y.o. male with a known history of CAD, CHF, COPD, type 2 diabetes with hyperlipidemia came to the ER with worsening pain in his foot.  He has been followed as outpatient by Dr. Alberteen Spindle podiatry seen first on 10/21/2020.  He had gangrene of the toe at that time.  He was referred to vascular surgery for an angiogram.  On 10/31/2000 Dr. Wyn Quaker did an angiogram and had 3 angioplasties and 1 stent placed.  With patient having worsening foot pain he came back to the hospital.  Pain 7 out of 10 in intensity.  He has been having trouble thinking straight.  His white blood cell count was elevated and he was tachycardic and still has gangrene.  Hospitalist services were contacted for admission for sepsis. Pt underwent L BKA on 11/10/20. PMH: CAD, chronic combined systolic & diastolic CHF, CKD stage 3, COPD, DM2, HLD, HTN, ischemic cardiomyopathy   OT comments  Danny [name] presents today with generalized weakness and limited endurance. He endorses 3/10 pain in his L knee and residual limb, as well as slight numbness/"pins and needles" in his R LE. He requires MinA for supine<sit, CGA for sit<supine. Pt is unable to come into standing using RW and repeatedly states "I just can't do it. I'm not strong enough." He makes several comments re: a fear of falling and exhibits a reluctance to even attempt sit<stand. Pt reports he feels his speech is slightly slurred -- nurse notified. Demonstrates 5/5 bilateral UE strength, grossly symmetrical. Danny [name] will benefit from ongoing OT while hospitalized, with encouragement/support for OOB activity. SNF remains appropriate DC plan of care.     Follow Up Recommendations  SNF    Equipment Recommendations       Recommendations for Other Services      Precautions / Restrictions Precautions Precautions:  Fall Restrictions Weight Bearing Restrictions: Yes LLE Weight Bearing: Non weight bearing Other Position/Activity Restrictions: L BKA on 11/10/20       Mobility Bed Mobility Overal bed mobility: Needs Assistance Bed Mobility: Supine to Sit     Supine to sit: Min assist;HOB elevated Sit to supine: Min guard;HOB elevated   General bed mobility comments: Min A for scooting towards HOB from sitting position    Transfers Overall transfer level: Needs assistance Equipment used: Rolling walker (2 wheeled) Transfers: Sit to/from Stand Sit to Stand: Max assist        Lateral/Scoot Transfers: Min assist General transfer comment: 3 X attempt sit<>stand, pt unable to come into standing position. Exhibits considerable fear of falling.    Balance Overall balance assessment: Needs assistance Sitting-balance support: Bilateral upper extremity supported;No upper extremity supported Sitting balance-[name]: Good Sitting balance - Comments: SBA seated EOB. Slight R lateral lean, pt able to self-correct after given VCs     Standing balance-[name]: Zero Standing balance comment: pt unable to come into standing                           ADL either performed or assessed with clinical judgement   ADL Overall ADL's : Needs assistance/impaired  Vision Patient Visual Report: No change from baseline     Perception     Praxis      Cognition Arousal/Alertness: Awake/alert Behavior During Therapy: WFL for tasks assessed/performed Overall Cognitive Status: Within Functional Limits for tasks assessed                   Orientation Level: Person;Place;Situation;Time     Following Commands: Follows one step commands inconsistently       General Comments: affect appropriate. A&O x 4. Pt reported that his speech was slightly "off" -- some slurring of words, 2/2 medication?        Exercises  Exercises: Other exercises Other Exercises Other Exercises: education given for positioning as therapist entered with L knee in flexed position; lacking 19 degrees from extension. Other Exercises: Supine/seated ther-ex performed on B LE including quad sets, hip abd/add, and LAQ. 12 reps. Also performed manual therapy with cross friction massage and stretching to hamstrings on L knee. Other Exercises: Educ for pt, daughter re: role of OT, bed mobility, repositioning, importance of OOB activity, safe technique for sit<>stand   Shoulder Instructions       General Comments Pt endorses dizziness with EOB sitting    Pertinent Vitals/ Pain       Pain Assessment: 0-10 Pain Score: 3  Pain Location: L residual limb Pain Descriptors / Indicators: Aching Pain Intervention(s): Limited activity within patient's tolerance;Monitored during session;Repositioned  Home Living                                          Prior Functioning/Environment              Frequency  Min 1X/week        Progress Toward Goals  OT Goals(current goals can now be found in the care plan section)  Progress towards OT goals: Progressing toward goals  Acute Rehab OT Goals Patient Stated Goal: to get to rehab OT Goal Formulation: With patient Time For Goal Achievement: 11/25/20 Potential to Achieve Goals: Good  Plan Discharge plan remains appropriate;Frequency remains appropriate    Co-evaluation                 AM-PAC OT "6 Clicks" Daily Activity     Outcome Measure   Help from another person eating meals?: None Help from another person taking care of personal grooming?: A Little Help from another person toileting, which includes using toliet, bedpan, or urinal?: A Lot Help from another person bathing (including washing, rinsing, drying)?: A Lot Help from another person to put on and taking off regular upper body clothing?: A Little Help from another person to put on and  taking off regular lower body clothing?: A Lot 6 Click Score: 16    End of Session Equipment Utilized During Treatment: Rolling walker  OT Visit Diagnosis: Unsteadiness on feet (R26.81);Other abnormalities of gait and mobility (R26.89);Muscle weakness (generalized) (M62.81) Pain - Right/Left: Left Pain - part of body: Knee;Leg   Activity Tolerance Patient tolerated treatment well;Other (comment) (pt limited by dizziness, SOB)   Patient Left in bed;with call bell/phone within reach;with bed alarm set;with nursing/sitter in room;with family/visitor present   Nurse Communication Mobility status;Other (comment) (Pt complaint of speech being "off")        Time: 1042-1057 OT Time Calculation (min): 15 min  Charges: OT General Charges $OT Visit: 1 Visit OT Treatments $Self Care/Home  Management : 8-22 mins  Latina Craver, PhD, MS, OTR/L 11/14/20, 1:32 PM

## 2020-11-14 NOTE — TOC Progression Note (Addendum)
Transition of Care (TOC) - Progression Note    Patient Details  Name: Danny MEDDAUGH Sr. MRN: [id number] Date of Birth: Aug 03, 1951  Transition of Care [facility]) CM/SW Contact  Margarito Liner, LCSW Phone Number: 11/14/2020, 12:49 PM  Clinical Narrative: This CSW working remote today. Tried calling in room. No answer. Will try again later to give SNF bed offers.    2:05 pm: CSW called patient in room to discuss bed offers. [facility] was first preference but they declined due to bed availability. Patient has accepted [facility]'s bed offer. Admissions coordinator is aware and will review referral again to confirm on her end. Patient stated he is fully vaccinated with booster.  3:14 pm: [facility] can accept patient tomorrow. Started auth. MD will order rapid COVID test in the morning.  Expected Discharge Plan: Skilled Nursing Facility Barriers to Discharge: Continued Medical Work up  Expected Discharge Plan and Services Expected Discharge Plan: Skilled Nursing Facility   Discharge Planning Services: CM Consult   Living arrangements for the past 2 months: Single Family Home                                       Social Determinants of Health (SDOH) Interventions    Readmission Risk Interventions Readmission Risk Prevention Plan 07/30/2019  Transportation Screening Complete  PCP or Specialist Appt within 5-7 Days Complete  Medication Review (RN CM) Complete  Some recent data might be hidden

## 2020-11-14 NOTE — Discharge Instructions (Signed)
Vascular surgery discharge instructions:  1) Please change stump dressing daily. If drainage is noted can change more often. ABD to the staple line. Covered with Kerlix. Covered with Ace. 2) Please keep your knee joint flexible. 3) You may shower. Gently clean the stump with soap and water. Gently pat dry.

## 2020-11-14 NOTE — Care Management Important Message (Signed)
Important Message  Patient Details  Name: Danny MERKEY Sr. MRN: [id number] Date of Birth: 06/01/1951   Medicare Important Message Given:  Yes     Johnell Comings 11/14/2020, 12:01 PM

## 2020-11-14 NOTE — Progress Notes (Signed)
Progress Note  Patient Name: Danny BRACH Sr. Date of Encounter: 11/14/2020  Primary Cardiologist: Mariah Milling  Subjective   Reports he is tired this afternoon.  Worked with PT twice.  No chest pain, dyspnea, palpitations, dizziness, presyncope, syncope, lower extremity swelling, or surgical stump site swelling.  BP improved.  Inpatient Medications    Scheduled Meds: . aspirin EC  81 mg Oral Daily  . atorvastatin  80 mg Oral QHS  . carvedilol  25 mg Oral BID WC  . enoxaparin (LOVENOX) injection  0.5 mg/kg Subcutaneous Q24H  . escitalopram  10 mg Oral Daily  . ezetimibe  10 mg Oral Daily  . furosemide  20 mg Oral Daily  . insulin aspart  0-15 Units Subcutaneous TID WC  . insulin aspart  0-5 Units Subcutaneous QHS  . insulin aspart  4 Units Subcutaneous TID WC  . insulin glargine  25 Units Subcutaneous Daily  . isosorbide mononitrate  30 mg Oral Daily  . potassium chloride  10 mEq Oral Daily  . sacubitril-valsartan  1 tablet Oral BID  . senna-docusate  2 tablet Oral BID  . spironolactone  25 mg Oral Daily  . tamsulosin  0.4 mg Oral Daily   Continuous Infusions:  PRN Meds: acetaminophen **OR** acetaminophen, baclofen, ipratropium-albuterol, morphine injection, nitroGLYCERIN, ondansetron **OR** ondansetron (ZOFRAN) IV, oxyCODONE-acetaminophen   Vital Signs    Vitals:   11/14/20 0735 11/14/20 0900 11/14/20 1100 11/14/20 1211  BP: (!) 186/101  (!) 153/98 125/71  Pulse: 83 96 86 88  Resp: 16  18 16   Temp: 98.2 F (36.8 C) 98 F (36.7 C)  98.6 F (37 C)  TempSrc:  Oral  Oral  SpO2: 96% 95% 94% 95%  Weight:      Height:        Intake/Output Summary (Last 24 hours) at 11/14/2020 1542 Last data filed at 11/14/2020 1343 Gross per 24 hour  Intake 240 ml  Output 2250 ml  Net -2010 ml   Filed Weights   11/06/20 1536 11/11/20 0452  Weight: 100 kg 100.9 kg    Telemetry    Not on telemetry - Personally Reviewed  ECG    No new tracings - Personally  Reviewed  Physical Exam   GEN: No acute distress.   Neck: No JVD. Cardiac: RRR, no murmurs, rubs, or gallops.  Respiratory: Clear to auscultation bilaterally.  GI: Soft, nontender, non-distended.   MS: No edema; left BKA. Neuro:  Alert and oriented x 3; Nonfocal.  Psych: Normal affect.  Labs    Chemistry Recent Labs  Lab 11/09/20 0509 11/10/20 0507 11/11/20 0640 11/12/20 0451 11/13/20 0447  NA 136   < > 137 137 140  K 3.9   < > 3.9 3.8 3.7  CL 107   < > 108 107 109  CO2 22   < > 21* 23 25  GLUCOSE 114*   < > 148* 127* 134*  BUN 26*   < > 22 20 22   CREATININE 1.13   < > 1.14 0.92 0.98  CALCIUM 8.5*   < > 8.2* 8.1* 8.3*  PROT 6.5  --   --   --   --   ALBUMIN 2.4*  --   --   --   --   AST 46*  --   --   --   --   ALT 60*  --   --   --   --   ALKPHOS 120  --   --   --   --  BILITOT 0.8  --   --   --   --   GFRNONAA >60   < > >60 >60 >60  ANIONGAP 7   < > 8 7 6    < > = values in this interval not displayed.     Hematology Recent Labs  Lab 11/10/20 0507 11/11/20 0640 11/12/20 0451  WBC 19.1* 18.0* 13.8*  RBC 4.30 4.07* 3.61*  HGB 12.6* 12.2* 11.1*  HCT 40.1 37.7* 33.8*  MCV 93.3 92.6 93.6  MCH 29.3 30.0 30.7  MCHC 31.4 32.4 32.8  RDW 13.0 13.1 13.1  PLT 479* 519* 486*    Cardiac EnzymesNo results for input(s): TROPONINI in the last 168 hours. No results for input(s): TROPIPOC in the last 168 hours.   BNPNo results for input(s): BNP, PROBNP in the last 168 hours.   DDimer No results for input(s): DDIMER in the last 168 hours.   Radiology    No results found.  Cardiac Studies   2D echo 11/08/2020: 1. Left ventricular ejection fraction, by estimation, is 35 to 40%. The  left ventricle has moderately decreased function. The left ventricle  demonstrates global hypokinesis. The left ventricular internal cavity size  was mildly dilated. Left ventricular  diastolic parameters are consistent with Grade I diastolic dysfunction  (impaired relaxation).  2.  Right ventricular systolic function is normal. The right ventricular  size is normal. Tricuspid regurgitation signal is inadequate for assessing  PA pressure.  3. The mitral valve is grossly normal. Trivial mitral valve  regurgitation. No evidence of mitral stenosis.  4. The aortic valve was not well visualized. Aortic valve regurgitation  is not visualized. No aortic stenosis is present.  5. The inferior vena cava is normal in size with <50% respiratory  variability, suggesting right atrial pressure of 8 mmHg. __________  2D echo 10/2016: - Left ventricle: The cavity size was normal. Systolic function was  normal. The estimated ejection fraction was in the range of 55%  to 60% based on apical images. The study is not technically  sufficient to allow evaluation of LV diastolic function.  - Right ventricle: Systolic function was normal.  - Pulmonary arteries: Systolic pressure could not be accurately  estimated.  __________  LHC 07/2016:  There is moderate to severe left ventricular systolic dysfunction.  The left ventricular ejection fraction is 25-35% by visual estimate.  There is trivial (1+) mitral regurgitation.  There is no aortic valve stenosis.  Mid LAD lesion, 20 %stenosed.  Ost 2nd Diag to 2nd Diag lesion, 90 %stenosed.  Dist LAD lesion, 40 %stenosed.  Ost 1st Diag lesion, 90 %stenosed.  1st Diag lesion, 80 %stenosed.  1st Mrg lesion, 90 %stenosed.  1st RPLB lesion, 90 %stenosed.   1. No evidence of obstructive disease affecting the main arteries. Patent mid LAD stent with mild in-stent restenosis and moderate LAD disease distally. The patient does have diffuse small vessel branch disease affecting 2 diagonals, OM1 and PL 1. The disease is distal. 2. Moderately to severely reduced LV systolic function with an EF of 25-30% with global hypokinesis. 3. Moderately elevated filling pressures.  Recommendations: Compared to his most recent cardiac  catheterization in 2016, the ejection fraction has decreased significantly. The patient seems to have mixed ischemic/ nonischemic cardiomyopathy. Continue medical therapy for coronary artery disease. Optimize heart failure treatment. I increased the dose of lisinopril. The patient is volume overloaded and I started oral furosemide. I'm going to avoid aggressive diuresis due to contrast exposure. Consider screening for sleep  apnea if that has not been done in the past.   Patient Profile     70 y.o. male with history of CAD status post LAD stenting, chronic combined CHF, ICM, CKD stage III, PAD, COPD with remote tobacco use, DM2, HTN, HLD, ED, and obesity, who was admitted on 4/3 with worsening left foot ischemia/gangrene now status post left BKA with preop echo demonstrating recurrent LV dysfunction with an EF of 35 to 40%.  Assessment & Plan    1.  HFrEF/ICM: -Echo in 07/2016 with an EF of 30% followed by improvement in LV systolic function on echo in 03/2017 to 55 to 60%.  Echo performed preoperatively this admission with finding of recurrent LV dysfunction with an EF of 35 to 40% with global hypokinesis and grade 1 diastolic dysfunction -He appears euvolemic and well compensated -Continue current medical therapy including carvedilol, Entresto, spironolactone, and Lasix -We will plan for ischemic work-up as an outpatient following full recovery from BKA -Consider SGLT2 inhibitor as an outpatient  2.  Left lower extremity ischemia/gangrene with sepsis: -Status post left BKA -Hemodynamically stable -Management per vascular surgery and internal medicine  3.  HTN: -Blood pressure is well controlled currently -Continue current medical therapy as above  4.  CAD involving the native coronary arteries without angina: -No symptoms concerning for angina -With recurrent LV dysfunction he will need an outpatient ischemic evaluation following full recovery from left BKA -Continue ASA, atorvastatin,  carvedilol, ezetimibe, and isosorbide mononitrate  5. HLD: -LDL 38 -Atorvastatin 80 mg  6.  CKD stage III: -Renal function normal on last check  7.  DM2: -A1c 8.4 -If BP and renal function allow consider addition of dapagliflozin -Per IM   For questions or updates, please contact [facility] Please consult [email or web address] for contact info under Cardiology/STEMI.    Signed, Eula Listen, PA-C [facility] Pager: [phone] 11/14/2020, 3:42 PM

## 2020-11-15 DIAGNOSIS — L03116 Cellulitis of left lower limb: Secondary | ICD-10-CM | POA: Diagnosis not present

## 2020-11-15 DIAGNOSIS — R6520 Severe sepsis without septic shock: Secondary | ICD-10-CM | POA: Diagnosis not present

## 2020-11-15 DIAGNOSIS — I96 Gangrene, not elsewhere classified: Secondary | ICD-10-CM | POA: Diagnosis not present

## 2020-11-15 DIAGNOSIS — A419 Sepsis, unspecified organism: Secondary | ICD-10-CM | POA: Diagnosis not present

## 2020-11-15 LAB — RESP PANEL BY RT-PCR (FLU A&B, COVID) ARPGX2
Influenza A by PCR: NEGATIVE
Influenza B by PCR: NEGATIVE
SARS Coronavirus 2 by RT PCR: NEGATIVE

## 2020-11-15 LAB — URINALYSIS, COMPLETE (UACMP) WITH MICROSCOPIC
Bacteria, UA: NONE SEEN
Bilirubin Urine: NEGATIVE
Glucose, UA: 50 mg/dL — AB
Ketones, ur: NEGATIVE mg/dL
Leukocytes,Ua: NEGATIVE
Nitrite: NEGATIVE
Protein, ur: NEGATIVE mg/dL
Specific Gravity, Urine: 1.016 (ref 1.005–1.030)
pH: 5 (ref 5.0–8.0)

## 2020-11-15 LAB — GLUCOSE, CAPILLARY
Glucose-Capillary: 178 mg/dL — ABNORMAL HIGH (ref 70–99)
Glucose-Capillary: 231 mg/dL — ABNORMAL HIGH (ref 70–99)
Glucose-Capillary: 140 mg/dL — ABNORMAL HIGH (ref 70–99)
Glucose-Capillary: 253 mg/dL — ABNORMAL HIGH (ref 70–99)

## 2020-11-15 MED ORDER — SACUBITRIL-VALSARTAN 49-51 MG PO TABS: 1.0000 | ORAL_TABLET | Freq: Two times a day (BID) | ORAL | 0 refills | Status: DC

## 2020-11-15 MED ORDER — SPIRONOLACTONE 25 MG PO TABS: 25.0000 mg | ORAL_TABLET | Freq: Every day | ORAL | Status: DC

## 2020-11-15 MED ORDER — OXYCODONE-ACETAMINOPHEN 7.5-325 MG PO TABS: 1.0000 | ORAL_TABLET | Freq: Three times a day (TID) | ORAL | 0 refills | Status: DC | PRN

## 2020-11-15 MED ORDER — TRESIBA FLEXTOUCH 100 UNIT/ML ~~LOC~~ SOPN: 25.0000 [IU] | PEN_INJECTOR | Freq: Every day | SUBCUTANEOUS | 3 refills | Status: DC

## 2020-11-15 NOTE — Discharge Summary (Signed)
Physician Discharge Summary  Patient ID: Danny Griffith Taubman Sr. MRN: [id number] DOB/AGE: Aug 10, 1950 70 y.o.  Admit date: 11/06/2020 Discharge date: 11/15/2020  Admission Diagnoses:  Discharge Diagnoses:  Active Problems:   Type 2 diabetes mellitus with hyperlipidemia (HCC)   Essential hypertension   COPD with chronic bronchitis (HCC)   Sepsis ([place])   Gangrene of left foot ([place])   Stage 2 chronic kidney disease   Thrush   Cellulitis of left foot   HFrEF (heart failure with reduced ejection fraction) (HCC)   Chronic systolic heart failure (HCC)   Reactive thrombocytosis   Discharged Condition: good  Hospital Course:  Patient admitted 11/06/2020 with clinical sepsis and gangrene of the left first toe and starting of the second toe and also gangrene going down the plantar surface of the foot. Patient had leukocytosis and tachycardia. Patient has been following with podiatry as outpatient and also had a recent angiogram by vascular surgery where they did 3 angioplasties and one stent. Patient seen in consultation by both podiatry and vascular surgery and he will be set up for a BKA on Thursday. Past medical history type 2 diabetes mellitus, hyperlipidemia, CAD, chronic diastolic CHF, COPD, essential hypertension and chronic kidney disease stage II Left lower extremity BKA on 4/7.Antibiotics completed on 4/9.  #1. Sepsis.  Left foot cellulitis. POA  Left foot gangrene in the first, second and third toe. Status left BKA. Peripheral vascular disease. Patient has completed antibiotics, status post BKA.  Currently doing well.  2.  Essential hypertension plan Chronic systolic congestive heart failure. Entresto and Aldactone started while in hospital.  Patient also required some IV Lasix.  Overall no exacerbation  3.  Uncontrolled type 2 diabetes with hyperglycemia Resume most of the home medicines, need to follow-up with PCP to adjust dose.    Consults: cardiology and vascular  surgery  Significant Diagnostic Studies:   Treatments: BKA,   Discharge Exam: Blood pressure (!) 176/97, pulse 89, temperature 98.5 F (36.9 C), resp. rate 16, height $RemoveBe'5\' 8"'hIlzxNtji$  (1.727 m), weight 100.9 kg, SpO2 96 %. General appearance: alert and cooperative Resp: clear to auscultation bilaterally Cardio: regular rate and rhythm, S1, S2 normal, no murmur, click, rub or gallop GI: soft, non-tender; bowel sounds normal; no masses,  no organomegaly Extremities: left bka  Disposition: Discharge disposition: 03-Skilled Nursing Facility       Discharge Instructions    Diet - low sodium heart healthy   Complete by: As directed    Discharge wound care:   Complete by: As directed    RN daily, follow with vascular surgery   Increase activity slowly   Complete by: As directed      Allergies as of 11/15/2020   No Known Allergies     Medication List    STOP taking these medications   lisinopril 2.5 MG tablet Commonly known as: ZESTRIL   metFORMIN 500 MG 24 hr tablet Commonly known as: GLUCOPHAGE-XR   potassium chloride 10 MEQ CR capsule Commonly known as: MICRO-K     TAKE these medications   Accu-Chek Guide test strip Generic drug: glucose blood CHECK CBGS 4 TIMES A DAY   aspirin 81 MG tablet Take 81 mg by mouth daily.   atorvastatin 80 MG tablet Commonly known as: LIPITOR Take 1 tablet (80 mg total) by mouth daily.   baclofen 10 MG tablet Commonly known as: LIORESAL TAKE 1 TABLET BY MOUTH THREE TIMES A DAY AS NEEDED FOR MUSCLE SPASMS   blood glucose meter kit and  supplies Kit Dispense based on patient and insurance preference. Check CBGs 4 times daily. Dx code E11.9.   carvedilol 12.5 MG tablet Commonly known as: COREG TAKE 1 TABLET (12.5 MG TOTAL) BY MOUTH 2 (TWO) TIMES DAILY WITH A MEAL.   clopidogrel 75 MG tablet Commonly known as: PLAVIX TAKE 1 TABLET BY MOUTH EVERY DAY   escitalopram 10 MG tablet Commonly known as: LEXAPRO TAKE 1 TABLET BY MOUTH EVERY  DAY   ezetimibe 10 MG tablet Commonly known as: ZETIA TAKE 1 TABLET BY MOUTH EVERY DAY   furosemide 20 MG tablet Commonly known as: LASIX TAKE 1 TABLET (20 MG) BY MOUTH ONCE EVERY OTHER DAY   isosorbide mononitrate 30 MG 24 hr tablet Commonly known as: IMDUR TAKE 1 TABLET BY MOUTH EVERY DAY   Jardiance 25 MG Tabs tablet Generic drug: empagliflozin Take 25 mg by mouth daily.   nitroGLYCERIN 0.4 MG SL tablet Commonly known as: Nitrostat Place 1 tablet (0.4 mg total) under the tongue every 5 (five) minutes as needed for chest pain. Maximum of 3 doses.   oxyCODONE-acetaminophen 7.5-325 MG tablet Commonly known as: PERCOCET Take 1 tablet by mouth every 8 (eight) hours as needed for severe pain. What changed:   when to take this  reasons to take this   Pen Needles 32G X 6 MM Misc 1 each by Does not apply route 2 (two) times daily.   sacubitril-valsartan 49-51 MG Commonly known as: ENTRESTO Take 1 tablet by mouth 2 (two) times daily.   spironolactone 25 MG tablet Commonly known as: ALDACTONE Take 1 tablet (25 mg total) by mouth daily.   tamsulosin 0.4 MG Caps capsule Commonly known as: FLOMAX TAKE 1 CAPSULE BY MOUTH EVERY DAY   Tresiba FlexTouch 100 UNIT/ML FlexTouch Pen Generic drug: insulin degludec Inject 25 Units into the skin daily. What changed: how much to take   Trulicity 4.5 PV/3.7SM Sopn Generic drug: Dulaglutide Inject 4.5 mg as directed once a week.   Ventolin HFA 108 (90 Base) MCG/ACT inhaler Generic drug: albuterol Inhale 2 puffs into the lungs every 6 (six) hours as needed for wheezing or shortness of breath.   albuterol (2.5 MG/3ML) 0.083% nebulizer solution Commonly known as: PROVENTIL Take 3 mLs (2.5 mg total) by nebulization every 6 (six) hours as needed for wheezing or shortness of breath.            Discharge Care Instructions  (From admission, onward)         Start     Ordered   11/15/20 0000  Discharge wound care:        Comments: RN daily, follow with vascular surgery   11/15/20 2707          Contact information for follow-up providers    Kris Hartmann, NP Follow up in 3 week(s).   Specialty: Vascular Surgery Why: To see Arna Medici. First postoperative wound follow-up. No studies needed. Contact information: [place] [id number] [phone]            Contact information for after-discharge care    [facility] .   Service: Skilled Nursing Contact information: [place] [address] [place] [phone]                 32 minutes Signed: Sharen Hones 11/15/2020, 9:19 AM

## 2020-11-15 NOTE — Progress Notes (Addendum)
I am notified by nurse that patient has been sleeping most of the day, did not eat his lunch.  When I see him, patient does not have any shortness of breath, lungs are clear.  He had some loose stools last night, which has resolved today.  This could be secondary to sleep deprivation.  But I need to rule out a UTI.  Currently patient is alert and oriented to time place and person.  I will order UA, ABG,  I will cancel discharge.  He can still be discharged tomorrow if condition improves.  If UA is positive probably will give him a dose of fosfomycin.

## 2020-11-15 NOTE — TOC Progression Note (Signed)
Transition of Care (TOC) - Progression Note    Patient Details  Name: Danny MOUNSEY Sr. MRN: [id number] Date of Birth: [birth year]  [facility]) CM/SW Contact  Margarito Liner, LCSW Phone Number: 11/15/2020, 8:39 AM  Clinical Narrative:  Insurance authorization approved: [id number]. Rapid COVID results are pending.   Expected Discharge Plan: Skilled Nursing Facility Barriers to Discharge: Continued Medical Work up  Expected Discharge Plan and Services Expected Discharge Plan: Skilled Nursing Facility   Discharge Planning Services: CM Consult   Living arrangements for the past 2 months: Single Family Home                                       Social Determinants of Health (SDOH) Interventions    Readmission Risk Interventions Readmission Risk Prevention Plan 07/30/2019  Transportation Screening Complete  PCP or Specialist Appt within 5-7 Days Complete  Medication Review (RN CM) Complete  Some recent data might be hidden

## 2020-11-15 NOTE — TOC Transition Note (Addendum)
Transition of Care [facility]) - CM/SW Discharge Note   Patient Details  Name: Danny FAKHOURI Sr. MRN: [id number] Date of Birth: 03-Sep-1950  [facility]) CM/SW Contact:  Margarito Liner, LCSW Phone Number: 11/15/2020, 12:05 PM   Clinical Narrative: Patient has orders to discharge to [facility] today. RN will call report to [phone]. EMS transport set up for 1:00. No further concerns. CSW signing off.    4:06 pm: Discharge cancelled. Left message for SNF admissions coordinator to notify. Cancelled transport.   Final next level of care: Skilled Nursing Facility Barriers to Discharge: Barriers Resolved   Patient Goals and CMS Choice Patient states their goals for this hospitalization and ongoing recovery are:: to get well   Choice offered to / list presented to : Patient  Discharge Placement PASRR number recieved: 11/11/20            Patient chooses bed at: [facility] Patient to be transferred to facility by: EMS Name of family member notified: Daughter Arline Asp and Sister Claris Che Patient and family notified of of transfer: 11/15/20  Discharge Plan and Services   Discharge Planning Services: CM Consult                                 Social Determinants of Health (SDOH) Interventions     Readmission Risk Interventions Readmission Risk Prevention Plan 07/30/2019  Transportation Screening Complete  PCP or Specialist Appt within 5-7 Days Complete  Medication Review (RN CM) Complete  Some recent data might be hidden

## 2020-11-16 LAB — GLUCOSE, CAPILLARY
Glucose-Capillary: 130 mg/dL — ABNORMAL HIGH (ref 70–99)
Glucose-Capillary: 234 mg/dL — ABNORMAL HIGH (ref 70–99)

## 2020-11-16 NOTE — Progress Notes (Signed)
Patient coherent alert oriented in nad No further confusion--knows time date year  UA noted to be negative for activwe infection  Ready for d/c to SNF when bed available  No charge note  Pleas Koch, MD [facility] 9:32 AM

## 2020-11-16 NOTE — Progress Notes (Signed)
Occupational Therapy Treatment Patient Details Name: Danny REZNIK Sr. MRN: [id number] DOB: Jun 02, 1951 Today's Date: 11/16/2020    History of present illness Danny [name]  is a 70 y.o. male with a known history of CAD, CHF, COPD, type 2 diabetes with hyperlipidemia came to the ER with worsening pain in his foot.  He has been followed as outpatient by Dr. [name] podiatry seen first on 10/21/2020.  He had gangrene of the toe at that time.  He was referred to vascular surgery for an angiogram.  On 10/31/2000 Dr. Lucky Cowboy did an angiogram and had 3 angioplasties and 1 stent placed.  With patient having worsening foot pain he came back to the hospital.  Pain 7 out of 10 in intensity.  He has been having trouble thinking straight.  His white blood cell count was elevated and he was tachycardic and still has gangrene.  Hospitalist services were contacted for admission for sepsis. Pt underwent L BKA on 11/10/20. PMH: CAD, chronic combined systolic & diastolic CHF, CKD stage 3, COPD, DM2, HLD, HTN, ischemic cardiomyopathy   OT comments  Pt seen for OT treatment this date to f/u re: safety with ADLs/ADL mobility. Pt with c/o slightly increased pain after exercising with PT, but is open to bathing/dressing tasks. Pt able to come to EOB sitting with SUPV only, HOB slightly elevated and use of grab bars. Pt demos G static sitting balance, P dynamic sitting (limits seated LB ADLs). Pt requires SETUP for UB grooming and dressing tasks including applying deodorant and donning t-shirt. Pt requires MIN A for bathing UB. OT engages pt in CTS from elevated EOB surface with RW with pt requiring MIN/MOD A (bed elevated ~6-7") and cues for safe hand placement with RW. Pt requires MAX A for actual LB bathing/dring posteriorly as he requires B UE support on RW to sustain stand. Pt returned to sitting with MIN/MOD A and able to perform sit to sup with CGA to protect residual limb. Pt tolerates session well. Left in bed with all needs met and  in reach. RN notified of session contents. Continue to recommend OT services f/u in SNF setting to ensure that pt maximizes safety and tolerance for ADLs/ADL mobility.    Follow Up Recommendations  SNF    Equipment Recommendations  Other (comment) (defer)    Recommendations for Other Services      Precautions / Restrictions Precautions Precautions: Fall Restrictions Weight Bearing Restrictions: Yes LLE Weight Bearing: Non weight bearing Other Position/Activity Restrictions: BKA on 11/10/20       Mobility Bed Mobility Overal bed mobility: Needs Assistance Bed Mobility: Supine to Sit;Sit to Supine Rolling: Modified independent (Device/Increase time)   Supine to sit: HOB elevated;Supervision Sit to supine: Min guard   General bed mobility comments: increased time, use of rails, HOB slightly elevated.    Transfers Overall transfer level: Needs assistance Equipment used: Rolling walker (2 wheeled) Transfers: Lateral/Scoot Transfers;Sit to/from Stand Sit to Stand: Min assist;Mod assist;From elevated surface        Lateral/Scoot Transfers: Min assist General transfer comment: MIN/MOD A to CTS from EOB elevated ~6-7" with RW and gait belt as well as posterior support. Pt does require increased time and cues for hand placement with RW. Does c/o some pain and likely would require significantly more assistance from a lower surface. Stand completed to engage in fxl ADL tasks so bed elevated to assist.    Balance Overall balance assessment: Needs assistance Sitting-balance support: No upper extremity supported Sitting balance-[name]:  Good Sitting balance - Comments: G static sitting     Standing balance-[name]: Poor Standing balance comment: requires MIN/MOD A to sustain static standing wiht B UE Support on RW. Could not accept challenge and cannot alternate UEs from walker.                           ADL either performed or assessed with clinical judgement    ADL Overall ADL's : Needs assistance/impaired     Grooming: Wash/dry hands;Wash/dry face;Oral care;Applying deodorant;Set up;Sitting   Upper Body Bathing: Minimal assistance;Sitting Upper Body Bathing Details (indicate cue type and reason): MIN A for back while in sitting, pt is able to perform bathing and drying ot anterior portion of trunk, arms and underarms. Lower Body Bathing: Moderate assistance;Maximal assistance;Sit to/from stand Lower Body Bathing Details (indicate cue type and reason): Pt requires MIN/MOD A to CTS with RW from elevated bed surface, but requires both UEs on walker to sustain static standing balance. Therefore, OT primarily completes posterior LB bathing and drying with MAX/TOTAL A. Pt is able to contribute to anterior LB bathing/drying while in sitting with SETUP. MOD/MAX A overall as pt also with difficulty reaching distal portion of R LE. Upper Body Dressing : Set up;Sitting   Lower Body Dressing: Maximal assistance;Sitting/lateral leans Lower Body Dressing Details (indicate cue type and reason): pt makes considerable effort to don sock to R LE but is unable to accomplish in EOB sitting d/t limited ROM and poor dynamic sitting balnace. Pt requires MAX A.                     Vision Patient Visual Report: No change from baseline     Perception     Praxis      Cognition Arousal/Alertness: Awake/alert Behavior During Therapy: WFL for tasks assessed/performed Overall Cognitive Status: Within Functional Limits for tasks assessed Area of Impairment: Memory;Problem solving                     Memory: Decreased short-term memory;Decreased recall of precautions       Problem Solving: Requires verbal cues;Requires tactile cues General Comments: Pt is overall appropriate conversationally. He is oriented to month, place and situation, but requires cues for year. Does have decreased short term memory-does not remember this therapist's name after 5  mins. OVerall able to follow commands and pleasant.        Exercises Total Joint Exercises Quad Sets: AROM;Left;5 reps (5 second hold, verbal and tactile cues to improve quad activation) Short Arc Quad: AROM;Left;10 reps (Pt required multiple verbal and tactile cues to complete exercise with appropriate quad activation) Straight Leg Raises: AROM;Both;15 reps Long Arc Quad: Left;AROM;AAROM;10 reps;Seated Knee Flexion: AROM;AAROM;Seated;Left;10 reps Other Exercises Other Exercises: OT engages pt in bathing, drying, dressing and grooming tasks seated EOB as well as one CTS for LB posterior bathing/drying.   Shoulder Instructions       General Comments      Pertinent Vitals/ Pain       Pain Assessment: Faces Faces Pain Scale: Hurts little more Pain Location: L residual limb Pain Descriptors / Indicators: Aching;Sore Pain Intervention(s): Limited activity within patient's tolerance;Monitored during session  Home Living                                          Prior  Functioning/Environment              Frequency  Min 1X/week        Progress Toward Goals  OT Goals(current goals can now be found in the care plan section)  Progress towards OT goals: Progressing toward goals  Acute Rehab OT Goals Patient Stated Goal: to get to rehab OT Goal Formulation: With patient Time For Goal Achievement: 11/25/20 Potential to Achieve Goals: Good  Plan Discharge plan remains appropriate;Frequency remains appropriate    Co-evaluation                 AM-PAC OT "6 Clicks" Daily Activity     Outcome Measure   Help from another person eating meals?: None Help from another person taking care of personal grooming?: A Little Help from another person toileting, which includes using toliet, bedpan, or urinal?: A Lot Help from another person bathing (including washing, rinsing, drying)?: A Lot Help from another person to put on and taking off regular upper body  clothing?: A Little Help from another person to put on and taking off regular lower body clothing?: A Lot 6 Click Score: 16    End of Session Equipment Utilized During Treatment: Gait belt;Rolling walker  OT Visit Diagnosis: Unsteadiness on feet (R26.81);Other abnormalities of gait and mobility (R26.89);Muscle weakness (generalized) (M62.81) Pain - Right/Left: Left Pain - part of body: Knee;Leg   Activity Tolerance Patient tolerated treatment well   Patient Left in bed;with call bell/phone within reach;with bed alarm set   Nurse Communication Mobility status;Other (comment) (pt with improved cognition and completed bathing with OT this date.)        Time: 0923-0948 OT Time Calculation (min): 25 min  Charges: OT General Charges $OT Visit: 1 Visit OT Treatments $Self Care/Home Management : 8-22 mins $Therapeutic Activity: 8-22 mins  Gerrianne Scale, MS, OTR/L ascom [phone] 11/16/20, 10:18 AM

## 2020-11-16 NOTE — Progress Notes (Signed)
Physical Therapy Treatment Patient Details Name: Danny OOMMEN Sr. MRN: [id number] DOB: 1951/03/24 Today's Date: 11/16/2020    History of Present Illness Danny [name]  is a 70 y.o. male with a known history of CAD, CHF, COPD, type 2 diabetes with hyperlipidemia came to the ER with worsening pain in his foot.  He has been followed as outpatient by Dr. Alberteen Spindle podiatry seen first on 10/21/2020.  He had gangrene of the toe at that time.  He was referred to vascular surgery for an angiogram.  On 10/31/2000 Dr. Wyn Quaker did an angiogram and had 3 angioplasties and 1 stent placed.  With patient having worsening foot pain he came back to the hospital.  Pain 7 out of 10 in intensity.  He has been having trouble thinking straight.  His white blood cell count was elevated and he was tachycardic and still has gangrene.  Hospitalist services were contacted for admission for sepsis. Pt underwent L BKA on 11/10/20. PMH: CAD, chronic combined systolic & diastolic CHF, CKD stage 3, COPD, DM2, HLD, HTN, ischemic cardiomyopathy    PT Comments    Pt awake and ready for session.  Mentally clear today and appropriate for session.  Pt on bed pan upon arrival.  Care provided and bedpan left in bathroom as stool was very gelatenous for RN to check.  Bed mobility and supine to/from sit with min guard/supervision.  Steady in sitting.  Some dizziness relieved with time as expected.  Sat EOB 15 minutes for LE ex and lateral scooting along with discussion on expectations of mobility and rehab.  He is able to lateral scoot towards L with increased time and encouragement.  Difficulty clearing hips off bed so small movements but able to do with supervision.  Fatigued with effort.  Reports difficulty with urination feeling as he has to "strain".  Will message RN about stool and continued difficulty with voiding as she is attending to another pt at this time.   Follow Up Recommendations  SNF     Equipment Recommendations  None recommended by  PT    Recommendations for Other Services       Precautions / Restrictions Precautions Precautions: Fall Restrictions Weight Bearing Restrictions: Yes LLE Weight Bearing: Non weight bearing Other Position/Activity Restrictions: BKA on 11/10/20    Mobility  Bed Mobility Overal bed mobility: Needs Assistance Bed Mobility: Supine to Sit;Rolling;Sidelying to Sit Rolling: Modified independent (Device/Increase time)   Supine to sit: Min guard Sit to supine: Supervision   General bed mobility comments: overall does well with rails for assist    Transfers Overall transfer level: Needs assistance Equipment used: None Transfers: Lateral/Scoot Transfers          Lateral/Scoot Transfers: Min assist General transfer comment: unable to stand but was able to lateral scoot left in bed with very small movements but no physical assist.  Ambulation/Gait                 Stairs             Wheelchair Mobility    Modified Rankin (Stroke Patients Only)       Balance Overall balance assessment: Needs assistance Sitting-balance support: Bilateral upper extremity supported;No upper extremity supported Sitting balance-[name]: Good Sitting balance - Comments: SBA seated EOB. Slight R lateral lean, pt able to self-correct after given VCs     Standing balance-[name]: Zero Standing balance comment: pt unable to come into standing  Cognition Arousal/Alertness: Awake/alert Behavior During Therapy: WFL for tasks assessed/performed Overall Cognitive Status: Within Functional Limits for tasks assessed                                        Exercises Total Joint Exercises Quad Sets: AROM;Left;5 reps (5 second hold, verbal and tactile cues to improve quad activation) Short Arc Quad: AROM;Left;10 reps (Pt required multiple verbal and tactile cues to complete exercise with appropriate quad activation) Straight Leg  Raises: AROM;Both;15 reps Long Arc Quad: Left;AROM;AAROM;10 reps;Seated Knee Flexion: AROM;AAROM;Seated;Left;10 reps    General Comments        Pertinent Vitals/Pain Pain Assessment: Faces Faces Pain Scale: Hurts a little bit Pain Location: L residual limb Pain Descriptors / Indicators: Aching;Sore Pain Intervention(s): Limited activity within patient's tolerance;Monitored during session;Repositioned    Home Living                      Prior Function            PT Goals (current goals can now be found in the care plan section) Progress towards PT goals: Progressing toward goals    Frequency    7X/week      PT Plan Current plan remains appropriate    Co-evaluation              AM-PAC PT "6 Clicks" Mobility   Outcome Measure  Help needed turning from your back to your side while in a flat bed without using bedrails?: A Little Help needed moving from lying on your back to sitting on the side of a flat bed without using bedrails?: A Little Help needed moving to and from a bed to a chair (including a wheelchair)?: A Lot Help needed standing up from a chair using your arms (e.g., wheelchair or bedside chair)?: Total Help needed to walk in hospital room?: Total Help needed climbing 3-5 steps with a railing? : Total 6 Click Score: 11    End of Session Equipment Utilized During Treatment: Gait belt Activity Tolerance: Patient limited by fatigue Patient left: in bed;with bed alarm set;with family/visitor present Nurse Communication: Mobility status PT Visit Diagnosis: Difficulty in walking, not elsewhere classified (R26.2);Muscle weakness (generalized) (M62.81);Pain Pain - Right/Left: Left Pain - part of body: Leg     Time: 4431-5400 PT Time Calculation (min) (ACUTE ONLY): 24 min  Charges:  $Therapeutic Exercise: 8-22 mins $Therapeutic Activity: 8-22 mins                    Danielle Dess, PTA 11/16/20, 9:16 AM

## 2020-11-16 NOTE — TOC Transition Note (Signed)
Transition of Care [facility]) - CM/SW Discharge Note   Patient Details  Name: Danny BREDESON Sr. MRN: [id number] Date of Birth: 1951/06/30  [facility]) CM/SW Contact:  Margarito Liner, LCSW Phone Number: 11/16/2020, 11:03 AM   Clinical Narrative:  Patient has orders to discharge to [facility] today. They do not need a new COVID test/discharge summary. MD entered progress note this morning showing that patient is stable for discharge. RN will call report to [phone]. EMS transport set up for 1:00. No further concerns. CSW signing off.  Final next level of care: Skilled Nursing Facility Barriers to Discharge: Barriers Resolved   Patient Goals and CMS Choice Patient states their goals for this hospitalization and ongoing recovery are:: to get well   Choice offered to / list presented to : Patient,Adult Children,Sibling  Discharge Placement PASRR number recieved: 11/11/20            Patient chooses bed at: [facility] Patient to be transferred to facility by: EMS Name of family member notified: Daughter Arline Asp and Sister Claris Che (yesterday) Patient and family notified of of transfer: 11/16/20  Discharge Plan and Services   Discharge Planning Services: CM Consult                                 Social Determinants of Health (SDOH) Interventions     Readmission Risk Interventions Readmission Risk Prevention Plan 07/30/2019  Transportation Screening Complete  PCP or Specialist Appt within 5-7 Days Complete  Medication Review (RN CM) Complete  Some recent data might be hidden

## 2020-11-21 ENCOUNTER — Ambulatory Visit (INDEPENDENT_AMBULATORY_CARE_PROVIDER_SITE_OTHER): Payer: 59 | Admitting: Nurse Practitioner

## 2020-11-21 ENCOUNTER — Encounter (INDEPENDENT_AMBULATORY_CARE_PROVIDER_SITE_OTHER): Payer: 59

## 2020-11-22 ENCOUNTER — Other Ambulatory Visit: Payer: 59

## 2020-11-24 ENCOUNTER — Ambulatory Visit: Payer: 59 | Admitting: Pharmacist

## 2020-11-24 NOTE — Chronic Care Management (AMB) (Signed)
   11/24/2020  Danny Friends Bacigalupo Sr. 08-13-1950 [id number]  Contacted patient for scheduled appointment. He is at [facility] skilled nursing s/p BKA. Notes he's having a very difficult time with pain, but is slowly getting more mobile.   Instructed patient to call me when he has a discharge plan so that I can call for medication reconcillation when he is home. At the moment, scheduled f/u face to face when he is currently scheduled to see Dr. [name] on 5/25.   Catie Feliz Beam, PharmD, [place], CPP Clinical Pharmacist [employer] [facility] [employer] [phone]

## 2020-11-28 ENCOUNTER — Encounter: Payer: Self-pay | Admitting: Emergency Medicine

## 2020-11-28 ENCOUNTER — Other Ambulatory Visit: Payer: Self-pay

## 2020-11-28 ENCOUNTER — Emergency Department: Payer: 59

## 2020-11-28 ENCOUNTER — Emergency Department
Admission: EM | Admit: 2020-11-28 | Discharge: 2020-11-28 | Disposition: A | Discharge status: Home / Self Care | Payer: 59 | Attending: Emergency Medicine | Admitting: Emergency Medicine

## 2020-11-28 DIAGNOSIS — Z955 Presence of coronary angioplasty implant and graft: Secondary | ICD-10-CM | POA: Diagnosis not present

## 2020-11-28 DIAGNOSIS — Z89512 Acquired absence of left leg below knee: Secondary | ICD-10-CM | POA: Diagnosis not present

## 2020-11-28 DIAGNOSIS — Z23 Encounter for immunization: Secondary | ICD-10-CM | POA: Insufficient documentation

## 2020-11-28 DIAGNOSIS — Y92129 Unspecified place in nursing home as the place of occurrence of the external cause: Secondary | ICD-10-CM | POA: Insufficient documentation

## 2020-11-28 DIAGNOSIS — E1140 Type 2 diabetes mellitus with diabetic neuropathy, unspecified: Secondary | ICD-10-CM | POA: Diagnosis not present

## 2020-11-28 DIAGNOSIS — J449 Chronic obstructive pulmonary disease, unspecified: Secondary | ICD-10-CM | POA: Diagnosis not present

## 2020-11-28 DIAGNOSIS — Z794 Long term (current) use of insulin: Secondary | ICD-10-CM | POA: Diagnosis not present

## 2020-11-28 DIAGNOSIS — W19XXXA Unspecified fall, initial encounter: Secondary | ICD-10-CM

## 2020-11-28 DIAGNOSIS — Z87891 Personal history of nicotine dependence: Secondary | ICD-10-CM | POA: Insufficient documentation

## 2020-11-28 DIAGNOSIS — Z79899 Other long term (current) drug therapy: Secondary | ICD-10-CM | POA: Insufficient documentation

## 2020-11-28 DIAGNOSIS — I25119 Atherosclerotic heart disease of native coronary artery with unspecified angina pectoris: Secondary | ICD-10-CM | POA: Diagnosis not present

## 2020-11-28 DIAGNOSIS — N183 Chronic kidney disease, stage 3 unspecified: Secondary | ICD-10-CM | POA: Insufficient documentation

## 2020-11-28 DIAGNOSIS — I130 Hypertensive heart and chronic kidney disease with heart failure and stage 1 through stage 4 chronic kidney disease, or unspecified chronic kidney disease: Secondary | ICD-10-CM | POA: Insufficient documentation

## 2020-11-28 DIAGNOSIS — R519 Headache, unspecified: Secondary | ICD-10-CM | POA: Diagnosis not present

## 2020-11-28 DIAGNOSIS — Z7984 Long term (current) use of oral hypoglycemic drugs: Secondary | ICD-10-CM | POA: Diagnosis not present

## 2020-11-28 DIAGNOSIS — S51012A Laceration without foreign body of left elbow, initial encounter: Secondary | ICD-10-CM | POA: Insufficient documentation

## 2020-11-28 DIAGNOSIS — T1490XA Injury, unspecified, initial encounter: Secondary | ICD-10-CM | POA: Diagnosis present

## 2020-11-28 DIAGNOSIS — Z7982 Long term (current) use of aspirin: Secondary | ICD-10-CM | POA: Diagnosis not present

## 2020-11-28 DIAGNOSIS — I5032 Chronic diastolic (congestive) heart failure: Secondary | ICD-10-CM | POA: Diagnosis not present

## 2020-11-28 DIAGNOSIS — Z7902 Long term (current) use of antithrombotics/antiplatelets: Secondary | ICD-10-CM | POA: Diagnosis not present

## 2020-11-28 DIAGNOSIS — W06XXXA Fall from bed, initial encounter: Secondary | ICD-10-CM | POA: Insufficient documentation

## 2020-11-28 DIAGNOSIS — E1122 Type 2 diabetes mellitus with diabetic chronic kidney disease: Secondary | ICD-10-CM | POA: Insufficient documentation

## 2020-11-28 LAB — CBC WITH DIFFERENTIAL/PLATELET
Abs Immature Granulocytes: 0.16 10*3/uL — ABNORMAL HIGH (ref 0.00–0.07)
Basophils Absolute: 0.1 10*3/uL (ref 0.0–0.1)
Basophils Relative: 1 %
Eosinophils Absolute: 1.1 10*3/uL — ABNORMAL HIGH (ref 0.0–0.5)
Eosinophils Relative: 11 %
HCT: 42 % (ref 39.0–52.0)
Hemoglobin: 13.3 g/dL (ref 13.0–17.0)
Immature Granulocytes: 2 %
Lymphocytes Relative: 20 %
Lymphs Abs: 2.1 10*3/uL (ref 0.7–4.0)
MCH: 29.3 pg (ref 26.0–34.0)
MCHC: 31.7 g/dL (ref 30.0–36.0)
MCV: 92.5 fL (ref 80.0–100.0)
Monocytes Absolute: 0.9 10*3/uL (ref 0.1–1.0)
Monocytes Relative: 9 %
Neutro Abs: 6 10*3/uL (ref 1.7–7.7)
Neutrophils Relative %: 57 %
Platelets: 397 10*3/uL (ref 150–400)
RBC: 4.54 MIL/uL (ref 4.22–5.81)
RDW: 14.1 % (ref 11.5–15.5)
WBC: 10.3 10*3/uL (ref 4.0–10.5)
nRBC: 0 % (ref 0.0–0.2)

## 2020-11-28 MED ORDER — ACETAMINOPHEN 500 MG PO TABS
1000.0000 mg | ORAL_TABLET | Freq: Once | ORAL | Status: AC
  Administered 2020-11-28: 1000 mg via ORAL
  Filled 2020-11-28: qty 2

## 2020-11-28 MED ORDER — TETANUS-DIPHTH-ACELL PERTUSSIS 5-2.5-18.5 LF-MCG/0.5 IM SUSY
0.5000 mL | PREFILLED_SYRINGE | Freq: Once | INTRAMUSCULAR | Status: AC
  Administered 2020-11-28: 0.5 mL via INTRAMUSCULAR
  Filled 2020-11-28: qty 0.5

## 2020-11-28 NOTE — ED Notes (Signed)
Called and gave report to [place] at [facility] health care.  Patient ready for transport.

## 2020-11-28 NOTE — ED Triage Notes (Signed)
Reported via [facility] for fall. Patient reports falling out of bed. Abrasion noted to left elbow. He did hit head but denies LOC. 3rd fall this month.

## 2020-11-28 NOTE — ED Provider Notes (Signed)
[facility] Emergency Department Provider Note  ____________________________________________   Event Date/Time   First MD Initiated Contact with Patient 11/28/20 1240     (approximate)  I have reviewed the triage vital signs and the nursing notes.   HISTORY  Chief Complaint Fall   HPI Danny DOMANSKI Sr. is a 70 y.o. male with a past medical history of CAD, CHF, CKD, COPD, DM, HTN, HDL and PVD status post left BKA who presents for assessment of a mild headache after he rolled out of his bed at his nursing facility striking his head.  Patient states he was asleep when he rolled out of bed.  He states he has a mild headache but no other acute pain.  States he has otherwise been in his usual state health without any recent fevers, chills, cough, nausea, vomiting, diarrhea, dysuria, rash, chest pain or other recent injuries.  States he is most worried about his left leg.  States he is on aspirin Plavix but no anticoagulation.         Past Medical History:  Diagnosis Date  . CAD (coronary artery disease)    a. 01/2010 PCI of LAD; b. 09/2014 PCI/DES of mLAD due to ISR. D1 80, D2 80(jailed), LCX 32m; c. 07/2016 NSTEMI/Cath: LM nl, LAD 52m ISR, 40d, D1 90ost, 80p, D2 90ost, RI min irregs, LCX min irregs, OM1 90 small, OM2/3 min irregs, RCA min irregs, RPLB1 90, EF 25-35%.  . Chronic combined systolic and diastolic CHF (congestive heart failure) ([place])    a. 07/2016 Echo: EF 30%, severe septal/anterior HK, Gr1 DD.  [name] CKD (chronic kidney disease), stage III ([place])   . COPD (chronic obstructive pulmonary disease) ([place])   . DM2 (diabetes mellitus, type 2) ([place])   . Erectile dysfunction   . HLD (hyperlipidemia)   . HTN (hypertension)   . Ischemic cardiomyopathy    a. 07/2016 Echo: EF 30% w/ sev septal/ant HK. Gr1 DD.    Patient Active Problem List   Diagnosis Date Noted  . Reactive thrombocytosis 11/14/2020  . Chronic systolic heart failure ([place])   . Cellulitis of  left foot 11/09/2020  . HFrEF (heart failure with reduced ejection fraction) ([place])   . Thrush   . Sepsis ([place]) 11/06/2020  . Gangrene of left foot ([place])   . PVD (peripheral vascular disease) ([place])   . Stage 2 chronic kidney disease   . Atherosclerosis of native arteries of the extremities with gangrene ([place]) 10/28/2020  . Low back pain 06/29/2020  . Onychomycosis 03/29/2020  . Fall 03/29/2020  . AKI (acute kidney injury) ([place]) 12/28/2019  . Coronary artery disease of native artery of native heart with stable angina pectoris ([place]) 12/12/2018  . Pure hypercholesterolemia 12/12/2018  . Chest pain 10/31/2018  . Atherosclerosis of native arteries of extremity with intermittent claudication ([place]) 02/11/2018  . Decreased pedal pulses 07/02/2017  . Orthostasis 05/07/2017  . Anxiety and depression 05/07/2017  . CKD (chronic kidney disease) stage 3, GFR 30-59 ml/min (H[facility]) 03/06/2017  . Diabetic neuropathy ([place]) 03/06/2017  . Chronic diastolic CHF (congestive heart failure) ([place]) 03/05/2017  . Morbid obesity ([place]) 06/18/2016  . H/O medication noncompliance 06/18/2016  . COPD with chronic bronchitis ([place]) 06/20/2015  . GERD (gastroesophageal reflux disease) 12/20/2014  . Type 2 diabetes mellitus with hyperlipidemia ([place]) 06/05/2010  . Hyperlipidemia 02/10/2010  . Essential hypertension 02/10/2010  . CAD (coronary artery disease) 02/10/2010    Past Surgical History:  Procedure Laterality Date  . AMPUTATION Left  11/10/2020   Procedure: AMPUTATION BELOW KNEE;  Surgeon: [name], MD;  Location: [facility] ORS;  Service: General;  Laterality: Left;  . CAD: stent to the LAD    . CARDIAC CATHETERIZATION  10/01/2014  . CARDIAC CATHETERIZATION N/A 07/23/2016   Procedure: Left Heart Cath and Coronary Angiography;  Surgeon: [name], MD;  Location: [facility];  Service: Cardiovascular;  Laterality: N/A;  . CORONARY ANGIOPLASTY WITH STENT PLACEMENT  10/01/2014  . LOWER EXTREMITY  ANGIOGRAPHY Left 10/31/2020   Procedure: LOWER EXTREMITY ANGIOGRAPHY;  Surgeon: [name], MD;  Location: [facility];  Service: Cardiovascular;  Laterality: Left;    Prior to Admission medications   Medication Sig Start Date End Date Taking? Authorizing Provider  ACCU-CHEK GUIDE test strip CHECK CBGS 4 TIMES A DAY 11/16/19   [name], MD  albuterol (PROVENTIL) (2.5 MG/3ML) 0.083% nebulizer solution Take 3 mLs (2.5 mg total) by nebulization every 6 (six) hours as needed for wheezing or shortness of breath. 09/30/20   [name], MD  aspirin 81 MG tablet Take 81 mg by mouth daily.    [provider]  atorvastatin (LIPITOR) 80 MG tablet Take 1 tablet (80 mg total) by mouth daily. 09/17/19   [name], MD  baclofen (LIORESAL) 10 MG tablet TAKE 1 TABLET BY MOUTH THREE TIMES A DAY AS NEEDED FOR MUSCLE SPASMS 10/24/20   [name], MD  blood glucose meter kit and supplies KIT Dispense based on patient and insurance preference. Check CBGs 4 times daily. Dx code E11.9. 09/17/19   [name], MD  carvedilol (COREG) 12.5 MG tablet TAKE 1 TABLET (12.5 MG TOTAL) BY MOUTH 2 (TWO) TIMES DAILY WITH A MEAL. 02/29/20   [name], MD  clopidogrel (PLAVIX) 75 MG tablet TAKE 1 TABLET BY MOUTH EVERY DAY 02/09/20   Gollan, Kathlene November, MD  Dulaglutide (TRULICITY) 4.5 QR/9.7JO SOPN Inject 4.5 mg as directed once a week. 07/11/20   [name], MD  empagliflozin (JARDIANCE) 25 MG TABS tablet Take 25 mg by mouth daily. 09/17/19   [name], MD  escitalopram (LEXAPRO) 10 MG tablet TAKE 1 TABLET BY MOUTH EVERY DAY 05/25/20   Einar Pheasant, MD  ezetimibe (ZETIA) 10 MG tablet TAKE 1 TABLET BY MOUTH EVERY DAY 02/29/20   [name], MD  furosemide (LASIX) 20 MG tablet TAKE 1 TABLET (20 MG) BY MOUTH ONCE EVERY OTHER DAY 03/21/20   [name], MD  insulin degludec (TRESIBA FLEXTOUCH) 100 UNIT/ML FlexTouch Pen Inject 25 Units into the skin  daily. 11/15/20   Sharen Hones, MD  Insulin Pen Needle (PEN NEEDLES) 32G X 6 MM MISC 1 each by Does not apply route 2 (two) times daily. 11/17/19   [name], MD  isosorbide mononitrate (IMDUR) 30 MG 24 hr tablet TAKE 1 TABLET BY MOUTH EVERY DAY 02/09/20   [name], MD  nitroGLYCERIN (NITROSTAT) 0.4 MG SL tablet Place 1 tablet (0.4 mg total) under the tongue every 5 (five) minutes as needed for chest pain. Maximum of 3 doses. 10/26/20   Marrianne Mood D, PA-C  oxyCODONE-acetaminophen (PERCOCET) 7.5-325 MG tablet Take 1 tablet by mouth every 8 (eight) hours as needed for severe pain. 11/15/20   Sharen Hones, MD  sacubitril-valsartan (ENTRESTO) 49-51 MG Take 1 tablet by mouth 2 (two) times daily. 11/15/20   Sharen Hones, MD  spironolactone (ALDACTONE) 25 MG tablet Take 1 tablet (25 mg total)  by mouth daily. 11/15/20   Sharen Hones, MD  tamsulosin (FLOMAX) 0.4 MG CAPS capsule TAKE 1 CAPSULE BY MOUTH EVERY DAY 07/04/20   Einar Pheasant, MD  VENTOLIN HFA 108 (90 Base) MCG/ACT inhaler Inhale 2 puffs into the lungs every 6 (six) hours as needed for wheezing or shortness of breath. 09/17/19   [name], MD    Allergies Patient has no known allergies.  Family History  Problem Relation Age of Onset  . Heart attack Father        complications  . Cancer Brother     Social History Social History   Tobacco Use  . Smoking status: Former Smoker    Packs/day: 3.00    Years: 0.00    Pack years: 0.00    Types: Cigarettes    Quit date: 09/07/2009    Years since quitting: 11.2  . Smokeless tobacco: Never Used  . Tobacco comment: quit june 2011  Vaping Use  . Vaping Use: Never used  Substance Use Topics  . Alcohol use: Yes    Comment: every other weekend  . Drug use: No    Review of Systems  Review of Systems  Constitutional: Negative for chills and fever.  HENT: Negative for sore throat.   Eyes: Negative for pain.  Respiratory: Negative for cough and stridor.    Cardiovascular: Negative for chest pain.  Gastrointestinal: Negative for vomiting.  Genitourinary: Negative for dysuria.  Musculoskeletal: Negative for myalgias.  Skin: Negative for rash.  Neurological: Positive for headaches. Negative for seizures and loss of consciousness.  Psychiatric/Behavioral: Negative for suicidal ideas.  All other systems reviewed and are negative.     ____________________________________________   PHYSICAL EXAM:  VITAL SIGNS: ED Triage Vitals  Enc Vitals Group     BP      Pulse      Resp      Temp      Temp src      SpO2      Weight      Height      Head Circumference      Peak Flow      Pain Score      Pain Loc      Pain Edu?      Excl. in [place]?    Vitals:   11/28/20 1241  BP: 101/66  Pulse: 96  Resp: 14  Temp: 97.9 F (36.6 C)  SpO2: 95%   Physical Exam Vitals and nursing note reviewed.  Constitutional:      Appearance: He is well-developed.  HENT:     Head: Normocephalic and atraumatic.     Right Ear: External ear normal.     Left Ear: External ear normal.     Nose: Nose normal.  Eyes:     Conjunctiva/sclera: Conjunctivae normal.  Cardiovascular:     Rate and Rhythm: Normal rate and regular rhythm.     Heart sounds: No murmur heard.   Pulmonary:     Effort: Pulmonary effort is normal. No respiratory distress.     Breath sounds: Normal breath sounds.  Abdominal:     Palpations: Abdomen is soft.     Tenderness: There is no abdominal tenderness.  Musculoskeletal:     Cervical back: Neck supple.     Right lower leg: No edema.     Left Lower Extremity: Left leg is amputated below knee.  Skin:    General: Skin is warm and dry.  Neurological:     Mental Status:  He is alert and oriented to person, place, and time.  Psychiatric:        Mood and Affect: Mood normal.   No tenderness step-offs or deformities over the C/T/L-spine.  No significant hematoma, tenderness or other obvious trauma to the face scalp head or neck.   There is a little bit of fluctuance under the staples in the left BKA without significant surrounding skin changes or point tenderness over the knee joint.  Right lower extremity is unremarkable.  Sensation intact other right lower extremity.  1+ PT pulse on the right.  Bilateral upper extremities have full strength and range of motion no areas of significant tenderness including bilateral snuffbox there is a skin tear over the left elbow that is hemostatic.  ____________________________________________   LABS (all labs ordered are listed, but only abnormal results are displayed)  Labs Reviewed  CBC WITH DIFFERENTIAL/PLATELET - Abnormal; Notable for the following components:      Result Value   Eosinophils Absolute 1.1 (*)    Abs Immature Granulocytes 0.16 (*)    All other components within normal limits   ____________________________________________  EKG  ____________________________________________  RADIOLOGY  ED MD interpretation: CT head and neck showed no clear acute fracture dislocation.  There is some anterolisthesis grade one of the neck I suspect this is chronic.  Plain film left elbow is unremarkable.  Plain film left knee shows no acute fracture dislocation of distal femur or other acute abnormality.   Official radiology report(s): DG Elbow Complete Left  Result Date: 11/28/2020 CLINICAL DATA:  Left elbow pain due to an injury when the patient fell out of bed. Initial encounter. EXAM: LEFT ELBOW - COMPLETE 3+ VIEW COMPARISON:  None. FINDINGS: There is no evidence of fracture, dislocation, or joint effusion. There is no evidence of arthropathy or other focal bone abnormality. A few vascular calcifications are noted. IMPRESSION: No acute abnormality. Electronically Signed   By: Inge Rise M.D.   On: 11/28/2020 14:25   CT Head Wo Contrast  Result Date: 11/28/2020 CLINICAL DATA:  Head trauma, minor. Neck trauma. Additional history provided: Fall, abrasion to left elbow,  patient reports hitting head but denies loss of consciousness, third fall this month EXAM: CT HEAD WITHOUT CONTRAST CT CERVICAL SPINE WITHOUT CONTRAST TECHNIQUE: Multidetector CT imaging of the head and cervical spine was performed following the standard protocol without intravenous contrast. Multiplanar CT image reconstructions of the cervical spine were also generated. COMPARISON:  Head CT 08/31/2016. No pertinent prior exams available for comparison. FINDINGS: CT HEAD FINDINGS Brain: Mild cerebral atrophy. Mild ill-defined hypoattenuation within the cerebral white matter is nonspecific, but compatible with chronic small vessel ischemic disease. Chronic lacunar infarct within the right thalamus (series 2, image 14). No demarcated cortical infarct. No extra-axial fluid collection. No evidence of intracranial mass. No midline shift. Redemonstrated retro cerebellar CSF density prominence likely reflecting a mega cisterna magna. Vascular: No hyperdense vessel.  Atherosclerotic calcifications. Skull: Normal. Negative for fracture or focal lesion. Sinuses/Orbits: Visualized orbits show no acute finding. Trace fluid within the inferior left frontal sinus. Trace mucosal thickening and small volume fluid scattered within the bilateral ethmoid sinuses. Other: Trace fluid within the right mastoid air cells. CT CERVICAL SPINE FINDINGS Mildly motion degraded exam. Alignment: Straightening of the expected cervical lordosis. 2 mm C2-C3, C3-C4 and C4-C5 grade 1 anterolisthesis. Skull base and vertebrae: The basion-dental and atlanto-dental intervals are maintained.No evidence of acute fracture to the cervical spine. Soft tissues and spinal canal: No prevertebral fluid or  swelling. No visible canal hematoma. Disc levels: Cervical spondylosis with mild multilevel disc space narrowing, multilevel disc bulges, levels of mild uncovertebral hypertrophy and levels of mild facet arthrosis. No appreciable high-grade spinal canal stenosis.  No high-grade bony neural foraminal narrowing. Upper chest: No consolidation within the imaged lung apices. No visible pneumothorax IMPRESSION: CT head: 1. No evidence of acute intracranial abnormality. 2. Mild cerebral atrophy and chronic small vessel ischemic disease. 3. Chronic right thalamic lacunar infarct. 4. Left frontal and bilateral ethmoid sinus disease as described. 5. Trace right mastoid effusion. CT cervical spine: 1. Mildly motion degraded exam. 2. No evidence of acute fracture to the cervical spine. 3. Mild C2-C3, C3-C4 and C4-C5 grade 1 anterolisthesis. Electronically Signed   By: Kellie Simmering DO   On: 11/28/2020 13:49   CT Cervical Spine Wo Contrast  Result Date: 11/28/2020 CLINICAL DATA:  Head trauma, minor. Neck trauma. Additional history provided: Fall, abrasion to left elbow, patient reports hitting head but denies loss of consciousness, third fall this month EXAM: CT HEAD WITHOUT CONTRAST CT CERVICAL SPINE WITHOUT CONTRAST TECHNIQUE: Multidetector CT imaging of the head and cervical spine was performed following the standard protocol without intravenous contrast. Multiplanar CT image reconstructions of the cervical spine were also generated. COMPARISON:  Head CT 08/31/2016. No pertinent prior exams available for comparison. FINDINGS: CT HEAD FINDINGS Brain: Mild cerebral atrophy. Mild ill-defined hypoattenuation within the cerebral white matter is nonspecific, but compatible with chronic small vessel ischemic disease. Chronic lacunar infarct within the right thalamus (series 2, image 14). No demarcated cortical infarct. No extra-axial fluid collection. No evidence of intracranial mass. No midline shift. Redemonstrated retro cerebellar CSF density prominence likely reflecting a mega cisterna magna. Vascular: No hyperdense vessel.  Atherosclerotic calcifications. Skull: Normal. Negative for fracture or focal lesion. Sinuses/Orbits: Visualized orbits show no acute finding. Trace fluid within  the inferior left frontal sinus. Trace mucosal thickening and small volume fluid scattered within the bilateral ethmoid sinuses. Other: Trace fluid within the right mastoid air cells. CT CERVICAL SPINE FINDINGS Mildly motion degraded exam. Alignment: Straightening of the expected cervical lordosis. 2 mm C2-C3, C3-C4 and C4-C5 grade 1 anterolisthesis. Skull base and vertebrae: The basion-dental and atlanto-dental intervals are maintained.No evidence of acute fracture to the cervical spine. Soft tissues and spinal canal: No prevertebral fluid or swelling. No visible canal hematoma. Disc levels: Cervical spondylosis with mild multilevel disc space narrowing, multilevel disc bulges, levels of mild uncovertebral hypertrophy and levels of mild facet arthrosis. No appreciable high-grade spinal canal stenosis. No high-grade bony neural foraminal narrowing. Upper chest: No consolidation within the imaged lung apices. No visible pneumothorax IMPRESSION: CT head: 1. No evidence of acute intracranial abnormality. 2. Mild cerebral atrophy and chronic small vessel ischemic disease. 3. Chronic right thalamic lacunar infarct. 4. Left frontal and bilateral ethmoid sinus disease as described. 5. Trace right mastoid effusion. CT cervical spine: 1. Mildly motion degraded exam. 2. No evidence of acute fracture to the cervical spine. 3. Mild C2-C3, C3-C4 and C4-C5 grade 1 anterolisthesis. Electronically Signed   By: Kellie Simmering DO   On: 11/28/2020 13:49   DG Knee Complete 4 Views Left  Result Date: 11/28/2020 CLINICAL DATA:  Status post fall.  Left knee pain. EXAM: LEFT KNEE - COMPLETE 4+ VIEW COMPARISON:  None. FINDINGS: There is no acute bony or joint abnormality. The patient is status post below the knee amputation. Surgical staples are in place. Soft tissues demonstrate atherosclerosis. IMPRESSION: No acute abnormality. Status post below the  knee amputation. Atherosclerosis. Electronically Signed   By: Inge Rise M.D.    On: 11/28/2020 14:25    ____________________________________________   PROCEDURES  Procedure(s) performed (including Critical Care):  .1-3 Lead EKG Interpretation Performed by: Lucrezia Starch, MD Authorized by: Lucrezia Starch, MD     Interpretation: normal     ECG rate assessment: normal     Rhythm: sinus rhythm     Ectopy: none     Conduction: normal       ____________________________________________   INITIAL IMPRESSION / ASSESSMENT AND PLAN / ED COURSE      Patient presents with above history exam for his some mild headache after he rolled out of bed at his nursing facility.  He is afebrile and hemodynamically stable on arrival.  He is neurovascularly intact in his upper and lower extremity and has not appears to be a small hematoma in his left distal lower extremity.  No findings to suggest cellulitis or DVT at this time.  CT head and neck showed no clear acute fracture dislocation.  There is some anterolisthesis grade one of the neck I suspect this is chronic.  Plain film left elbow is unremarkable.  Plain film left knee shows no acute fracture dislocation of distal femur or other acute abnormality.   Low suspicion for occult orthopedic or significant visceral trauma at this time.  Certainly possible patient sustained a mild concussion although he states on my assessment he milligrams a headache.  Given stable vitals with eyes reassuring exam work-up I believe he is safe for discharge back to his nursing facility.  Discharged stable condition.  Strict return precautions advised and discussed.       ____________________________________________   FINAL CLINICAL IMPRESSION(S) / ED DIAGNOSES  Final diagnoses:  Fall, initial encounter  Skin tear of left elbow without complication, initial encounter    Medications  acetaminophen (TYLENOL) tablet 1,000 mg (1,000 mg Oral Given 11/28/20 1311)  Tdap (BOOSTRIX) injection 0.5 mL (0.5 mLs Intramuscular Given 11/28/20  1312)     ED Discharge Orders    None       Note:  This document was prepared using Dragon voice recognition software and may include unintentional dictation errors.   Lucrezia Starch, MD 11/28/20 1450

## 2020-11-28 NOTE — ED Notes (Signed)
[facility] Care with report.

## 2020-11-28 NOTE — ED Notes (Signed)
Patient voided in urinal .  Went to CT scan/xray via stretcher.

## 2020-12-06 ENCOUNTER — Ambulatory Visit (INDEPENDENT_AMBULATORY_CARE_PROVIDER_SITE_OTHER): Payer: 59

## 2020-12-06 ENCOUNTER — Other Ambulatory Visit: Payer: Self-pay

## 2020-12-06 ENCOUNTER — Ambulatory Visit (INDEPENDENT_AMBULATORY_CARE_PROVIDER_SITE_OTHER): Payer: 59 | Admitting: Nurse Practitioner

## 2020-12-06 VITALS — BP 113/77 | HR 98

## 2020-12-06 DIAGNOSIS — R0600 Dyspnea, unspecified: Secondary | ICD-10-CM

## 2020-12-06 DIAGNOSIS — I779 Disorder of arteries and arterioles, unspecified: Secondary | ICD-10-CM

## 2020-12-06 DIAGNOSIS — Z89512 Acquired absence of left leg below knee: Secondary | ICD-10-CM

## 2020-12-06 DIAGNOSIS — E7800 Pure hypercholesterolemia, unspecified: Secondary | ICD-10-CM

## 2020-12-06 MED ORDER — PERFLUTREN LIPID MICROSPHERE
1.0000 mL | INTRAVENOUS | Status: AC | PRN
  Administered 2020-12-06: 2 mL via INTRAVENOUS

## 2020-12-06 MED ORDER — GABAPENTIN 300 MG PO CAPS: 300.0000 mg | ORAL_CAPSULE | Freq: Every day | ORAL | 3 refills | Status: DC

## 2020-12-07 LAB — ECHOCARDIOGRAM COMPLETE
AR max vel: 3.45 cm2
AV Area VTI: 3.24 cm2
AV Area mean vel: 2.93 cm2
AV Mean grad: 2 mmHg
AV Peak grad: 3.3 mmHg
Ao pk vel: 0.91 m/s
Area-P 1/2: 2.6 cm2

## 2020-12-08 ENCOUNTER — Ambulatory Visit (INDEPENDENT_AMBULATORY_CARE_PROVIDER_SITE_OTHER): Payer: 59

## 2020-12-08 VITALS — Ht 68.0 in | Wt 222.0 lb

## 2020-12-08 DIAGNOSIS — Z0000 Encounter for general adult medical examination without abnormal findings: Secondary | ICD-10-CM

## 2020-12-08 NOTE — Progress Notes (Signed)
Subjective:   Danny Estimable Gillyard Sr. is a 70 y.o. male who presents for Medicare Annual/Subsequent preventive examination.  Review of Systems    No ROS.  Medicare Wellness Virtual Visit.     Cardiac Risk Factors include: advanced age (>52men, >59 women);diabetes mellitus;male gender     Objective:    Today's Vitals   12/08/20 1238  Weight: 222 lb (100.7 kg)  Height: $Remove'5\' 8"'fdfVBpA$  (1.727 m)   Body mass index is 33.75 kg/m.  Advanced Directives 11/28/2020 11/10/2020 11/06/2020 10/05/2020 12/24/2019 12/22/2019 12/08/2019  Does Patient Have a Medical Advance Directive? No Unable to assess, patient is non-responsive or altered mental status Unable to assess, patient is non-responsive or altered mental status No No No No  Would patient like information on creating a medical advance directive? No - Patient declined - - - - No - Patient declined Yes (MAU/Ambulatory/Procedural Areas - Information given)    Current Medications (verified) Outpatient Encounter Medications as of 12/08/2020  Medication Sig  . ACCU-CHEK GUIDE test strip CHECK CBGS 4 TIMES A DAY  . albuterol (PROVENTIL) (2.5 MG/3ML) 0.083% nebulizer solution Take 3 mLs (2.5 mg total) by nebulization every 6 (six) hours as needed for wheezing or shortness of breath.  [name] aspirin 81 MG tablet Take 81 mg by mouth daily.  [name] atorvastatin (LIPITOR) 80 MG tablet Take 1 tablet (80 mg total) by mouth daily.  . baclofen (LIORESAL) 10 MG tablet TAKE 1 TABLET BY MOUTH THREE TIMES A DAY AS NEEDED FOR MUSCLE SPASMS  . blood glucose meter kit and supplies KIT Dispense based on patient and insurance preference. Check CBGs 4 times daily. Dx code E11.9.  [name] carvedilol (COREG) 12.5 MG tablet TAKE 1 TABLET (12.5 MG TOTAL) BY MOUTH 2 (TWO) TIMES DAILY WITH A MEAL.  [name] clopidogrel (PLAVIX) 75 MG tablet TAKE 1 TABLET BY MOUTH EVERY DAY  . Dulaglutide (TRULICITY) 4.5 JE/5.6DJ SOPN Inject 4.5 mg as directed once a week.  . empagliflozin (JARDIANCE) 25 MG TABS tablet Take 25 mg  by mouth daily.  [name] escitalopram (LEXAPRO) 10 MG tablet TAKE 1 TABLET BY MOUTH EVERY DAY  . ezetimibe (ZETIA) 10 MG tablet TAKE 1 TABLET BY MOUTH EVERY DAY  . fluticasone-salmeterol (WIXELA INHUB) 250-50 MCG/ACT AEPB Inhale 1 puff into the lungs 2 (two) times daily.  . furosemide (LASIX) 20 MG tablet TAKE 1 TABLET (20 MG) BY MOUTH ONCE EVERY OTHER DAY  . gabapentin (NEURONTIN) 300 MG capsule Take 1 capsule (300 mg total) by mouth at bedtime.  . insulin degludec (TRESIBA FLEXTOUCH) 100 UNIT/ML FlexTouch Pen Inject 25 Units into the skin daily.  . Insulin Pen Needle (PEN NEEDLES) 32G X 6 MM MISC 1 each by Does not apply route 2 (two) times daily.  . isosorbide mononitrate (IMDUR) 30 MG 24 hr tablet TAKE 1 TABLET BY MOUTH EVERY DAY  . nitroGLYCERIN (NITROSTAT) 0.4 MG SL tablet Place 1 tablet (0.4 mg total) under the tongue every 5 (five) minutes as needed for chest pain. Maximum of 3 doses.  [name] oxyCODONE-acetaminophen (PERCOCET) 7.5-325 MG tablet Take 1 tablet by mouth every 8 (eight) hours as needed for severe pain.  . sacubitril-valsartan (ENTRESTO) 49-51 MG Take 1 tablet by mouth 2 (two) times daily.  [name] spironolactone (ALDACTONE) 25 MG tablet Take 1 tablet (25 mg total) by mouth daily.  . tamsulosin (FLOMAX) 0.4 MG CAPS capsule TAKE 1 CAPSULE BY MOUTH EVERY DAY  . VENTOLIN HFA 108 (90 Base) MCG/ACT inhaler Inhale 2 puffs into the lungs  every 6 (six) hours as needed for wheezing or shortness of breath.   No facility-administered encounter medications on file as of 12/08/2020.    Allergies (verified) Patient has no known allergies.   History: Past Medical History:  Diagnosis Date  . CAD (coronary artery disease)    a. 01/2010 PCI of LAD; b. 09/2014 PCI/DES of mLAD due to ISR. D1 80, D2 80(jailed), LCX 64m; c. 07/2016 NSTEMI/Cath: LM nl, LAD 38m ISR, 40d, D1 90ost, 80p, D2 90ost, RI min irregs, LCX min irregs, OM1 90 small, OM2/3 min irregs, RCA min irregs, RPLB1 90, EF 25-35%.  . Chronic combined  systolic and diastolic CHF (congestive heart failure) ([place])    a. 07/2016 Echo: EF 30%, severe septal/anterior HK, Gr1 DD.  [name] CKD (chronic kidney disease), stage III ([place])   . COPD (chronic obstructive pulmonary disease) ([place])   . DM2 (diabetes mellitus, type 2) ([place])   . Erectile dysfunction   . HLD (hyperlipidemia)   . HTN (hypertension)   . Ischemic cardiomyopathy    a. 07/2016 Echo: EF 30% w/ sev septal/ant HK. Gr1 DD.   Past Surgical History:  Procedure Laterality Date  . AMPUTATION Left 11/10/2020   Procedure: AMPUTATION BELOW KNEE;  Surgeon: [name], MD;  Location: [facility];  Service: General;  Laterality: Left;  . CAD: stent to the LAD    . CARDIAC CATHETERIZATION  10/01/2014  . CARDIAC CATHETERIZATION N/A 07/23/2016   Procedure: Left Heart Cath and Coronary Angiography;  Surgeon: [name], MD;  Location: [facility];  Service: Cardiovascular;  Laterality: N/A;  . CORONARY ANGIOPLASTY WITH STENT PLACEMENT  10/01/2014  . LOWER EXTREMITY ANGIOGRAPHY Left 10/31/2020   Procedure: LOWER EXTREMITY ANGIOGRAPHY;  Surgeon: [name], MD;  Location: [facility];  Service: Cardiovascular;  Laterality: Left;   Family History  Problem Relation Age of Onset  . Heart attack Father        complications  . Cancer Brother    Social History   Socioeconomic History  . Marital status: Single    Spouse name: Not on file  . Number of children: Not on file  . Years of education: Not on file  . Highest education level: Not on file  Occupational History  . Not on file  Tobacco Use  . Smoking status: Former Smoker    Packs/day: 3.00    Years: 0.00    Pack years: 0.00    Types: Cigarettes    Quit date: 09/07/2009    Years since quitting: 11.2  . Smokeless tobacco: Never Used  . Tobacco comment: quit june 2011  Vaping Use  . Vaping Use: Never used  Substance and Sexual Activity  . Alcohol use: Yes    Comment: every other weekend  . Drug use: No  . Sexual  activity: Not on file  Other Topics Concern  . Not on file  Social History Narrative  . Not on file   Social Determinants of Health   Financial Resource Strain: Low Risk   . Difficulty of Paying Living Expenses: Not hard at all  Food Insecurity: No Food Insecurity  . Worried About Charity fundraiser in the Last Year: Never true  . Ran Out of Food in the Last Year: Never true  Transportation Needs: No Transportation Needs  . Lack of Transportation (Medical): No  . Lack of Transportation (Non-Medical): No  Physical Activity: Not on file  Stress: No Stress Concern Present  . Feeling  of Stress : Only a little  Social Connections: Unknown  . Frequency of Communication with Friends and Family: Not on file  . Frequency of Social Gatherings with Friends and Family: More than three times a week  . Attends Religious Services: Not on file  . Active Member of Clubs or Organizations: Not on file  . Attends Archivist Meetings: Not on file  . Marital Status: Not on file    Tobacco Counseling Counseling given: Not Answered Comment: quit june 2011   Clinical Intake:  Pre-visit preparation completed: Yes    Financial Strains and Diabetes Management: Is the patient seen by Chronic Care Management for management of their diabetes?  Yes     Diabetes: Yes  How often do you need to have someone help you when you read instructions, pamphlets, or other written materials from your doctor or pharmacy?: 1 - Never   Interpreter Needed?: No      Activities of Daily Living In your present state of health, do you have any difficulty performing the following activities: 12/08/2020 11/06/2020  Hearing? N N  Vision? N Y  Difficulty concentrating or making decisions? N N  Walking or climbing stairs? Y Y  Comment Leg amputee -  Dressing or bathing? N N  Doing errands, shopping? Danny [name]  Preparing Food and eating ? Y -  Using the Toilet? Y -  In the past six months, have you accidently  leaked urine? N -  Do you have problems with loss of bowel control? N -  Managing your Medications? N -  Managing your Finances? N -  Housekeeping or managing your Housekeeping? Y -  Some recent data might be hidden    Patient Care Team: [name], MD as PCP - General (Family Medicine) [name], MD as PCP - Cardiology (Cardiology) [name], MD as Consulting Physician (Cardiology) De Hollingshead, RPH-CPP as Pharmacist (Pharmacist)  Indicate any recent Medical Services you may have received from other than [facility] providers in the past year (date may be approximate).     Assessment:   This is a routine wellness examination for Danny [name].  I connected with Danny [name] today by telephone and verified that I am speaking with the correct person using two identifiers. Location patient: home Location provider: work Persons participating in the virtual visit: patient, Marine scientist.    I discussed the limitations, risks, security and privacy concerns of performing an evaluation and management service by telephone and the availability of in person appointments. The patient expressed understanding and verbally consented to this telephonic visit.    Interactive audio and video telecommunications were attempted between this provider and patient, however failed, due to patient having technical difficulties OR patient did not have access to video capability.  We continued and completed visit with audio only.  Some vital signs may be absent or patient reported.   Hearing/Vision screen  Hearing Screening   '125Hz'$  $[place]o'250Hz'ijLwh$'500Hz'$'1000Hz'$'2000Hz'$'3000Hz'$'4000Hz'$'6000Hz'$'8000Hz'$   Right ear:           Left ear:           Comments: Patient is able to hear conversational tones without difficulty.  No issues reported.  Vision Screening Comments: Wears corrective lenses Visual acuity not assessed, virtual visit.  They have seen their ophthalmologist in the last 12 months.    Dietary issues and  exercise activities discussed: Current Exercise Habits: Structured exercise class, Intensity: Mild  Goals Addressed  This Visit's Progress     Patient Stated   .  Medication Monitoring (pt-stated)        Patient Goals/Self-Care Activities . Over the next 90 days, patient will:  - take medications as prescribed check blood glucose at least three times daily using CGM, document, and provide at future appointments collaborate with provider on medication access solutions.       Depression Screen PHQ 2/9 Scores 12/08/2020 09/30/2020 06/29/2020 03/29/2020 03/29/2020 12/08/2019 11/18/2019  PHQ - 2 Score 0 0 0 0 0 1 1    Fall Risk Fall Risk  09/30/2020 06/29/2020 03/29/2020 12/08/2019 11/18/2019  Falls in the past year? 0 1 1 (No Data) 1  Comment - - - None since reported 2 weeks ago -  Number falls in past yr: 0 0 0 - 0  Injury with Fall? - - 1 - 0  Risk for fall due to : - - - History of fall(s) History of fall(s);Medication side effect  Follow up Falls evaluation completed Falls evaluation completed Falls evaluation completed Falls evaluation completed Falls prevention discussed    FALL RISK PREVENTION PERTAINING TO THE HOME: Handrails in use when climbing stairs? Yes Home free of loose throw rugs in walkways, pet beds, electrical cords, etc? Yes  Adequate lighting in your home to reduce risk of falls? Yes   ASSISTIVE DEVICES UTILIZED TO PREVENT FALLS: Life alert? No   Use of a cane, walker or w/c? Yes  Grab bars in the bathroom? Yes  Shower chair or bench in shower? Yes  Elevated toilet seat or a handicapped toilet? Yes   TIMED UP AND GO: Was the test performed? No .   Cognitive Function:     6CIT Screen 12/08/2020 12/08/2019 03/20/2018  What Year? 0 points 0 points 0 points  What month? 0 points 0 points 0 points  What time? 0 points 0 points 0 points  Count back from 20 0 points 0 points 0 points  Months in reverse 0 points 4 points 4 points  Repeat phrase - 4  points -  Total Score - 8 -    Immunizations Immunization History  Administered Date(s) Administered  . Fluad Quad(high Dose 65+) 08/02/2019, 09/30/2020  . Influenza, High Dose Seasonal PF 09/01/2018  . Moderna Sars-Covid-2 Vaccination 04/07/2020, 05/09/2020  . Pneumococcal Polysaccharide-23 08/02/2019  . Tdap 11/28/2020   Health Maintenance Health Maintenance  Topic Date Due  . OPHTHALMOLOGY EXAM  12/08/2020 (Originally 04/26/2019)  . COLONOSCOPY (Pts 45-32yrs Insurance coverage will need to be confirmed)  03/29/2021 (Originally 01/23/1996)  . COVID-19 Vaccine (3 - Booster for Moderna series) 08/04/2021 (Originally 11/07/2020)  . PNA vac Low Risk Adult (2 of 2 - PCV13) 12/08/2021 (Originally 08/01/2020)  . INFLUENZA VACCINE  03/06/2021  . FOOT EXAM  03/29/2021  . HEMOGLOBIN A1C  03/30/2021  . TETANUS/TDAP  11/29/2030  . Hepatitis C Screening  Completed  . HPV VACCINES  Aged Out    Vaccines declined .  DG Chest port 1 view: completed 11/2020  Vision Screening: Recommended annual ophthalmology exams for early detection of glaucoma and other disorders of the eye. Is the patient up to date with their annual eye exam?  Yes   Dental Screening: Recommended annual dental exams for proper oral hygiene. Dentures.   Community Resource Referral / Chronic Care Management: CRR required this visit?  No   CCM required this visit?  No      Plan:   Keep all routine maintenance appointments.   I have  personally reviewed and noted the following in the patient's chart:   . Medical and social history . Use of alcohol, tobacco or illicit drugs  . Current medications and supplements including opioid prescriptions. Patient is currently taking opioid prescriptions. Information provided to patient regarding non-opioid alternatives. Patient advised to discuss non-opioid treatment plan with their provider. Currently in rehab with opioids managed via physician on call.  . Functional ability and  status . Nutritional status . Physical activity . Advanced directives . List of other physicians . Hospitalizations, surgeries, and ER visits in previous 12 months . Vitals . Screenings to include cognitive, depression, and falls . Referrals and appointments  In addition, I have reviewed and discussed with patient certain preventive protocols, quality metrics, and best practice recommendations. A written personalized care plan for preventive services as well as general preventive health recommendations were provided to patient via mail.     Varney Biles, LPN   09/11/6387

## 2020-12-08 NOTE — Patient Instructions (Addendum)
Danny [name] , Thank you for taking time to come for your Medicare Wellness Visit. I appreciate your ongoing commitment to your health goals. Please review the following plan we discussed and let me know if I can assist you in the future.   These are the goals we discussed: Goals      Patient Stated   .  I don't always remember to check my blood sugar (pt-stated)      Keep glucometer with pill box in plain view. Check blood sugar when taking medication.     .  Medication Monitoring (pt-stated)      Patient Goals/Self-Care Activities . Over the next 90 days, patient will:  - take medications as prescribed check blood glucose at least three times daily using CGM, document, and provide at future appointments collaborate with provider on medication access solutions.        This is a list of the screening recommended for you and due dates:  Health Maintenance  Topic Date Due  . Eye exam for diabetics  12/08/2020*  . Colon Cancer Screening  03/29/2021*  . COVID-19 Vaccine (3 - Booster for Moderna series) 08/04/2021*  . Pneumonia vaccines (2 of 2 - PCV13) 12/08/2021*  . Flu Shot  03/06/2021  . Complete foot exam   03/29/2021  . Hemoglobin A1C  03/30/2021  . Tetanus Vaccine  11/29/2030  .  Hepatitis C: One time screening is recommended by Center for Disease Control  (CDC) for  adults born from [birth year] through 1965.   Completed  . HPV Vaccine  Aged Out  *Topic was postponed. The date shown is not the original due date.   Immunizations Immunization History  Administered Date(s) Administered  . Fluad Quad(high Dose 65+) 08/02/2019, 09/30/2020  . Influenza, High Dose Seasonal PF 09/01/2018  . Moderna Sars-Covid-2 Vaccination 04/07/2020, 05/09/2020  . Pneumococcal Polysaccharide-23 08/02/2019  . Tdap 11/28/2020   Advanced directives: not yet completed.   Conditions/risks identified: recent leg amputee  Next appointment: Follow up in one year for your annual wellness  visit  Preventive Care 65 Years and Older, Male Preventive care refers to lifestyle choices and visits with your health care provider that can promote health and wellness. What does preventive care include?  A yearly physical exam. This is also called an annual well check.  Dental exams once or twice a year.  Routine eye exams. Ask your health care provider how often you should have your eyes checked.  Personal lifestyle choices, including:  Daily care of your teeth and gums.  Regular physical activity.  Eating a healthy diet.  Avoiding tobacco and drug use.  Limiting alcohol use.  Practicing safe sex.  Taking low doses of aspirin every day.  Taking vitamin and mineral supplements as recommended by your health care provider. What happens during an annual well check? The services and screenings done by your health care provider during your annual well check will depend on your age, overall health, lifestyle risk factors, and family history of disease. Counseling  Your health care provider may ask you questions about your:  Alcohol use.  Tobacco use.  Drug use.  Emotional well-being.  Home and relationship well-being.  Sexual activity.  Eating habits.  History of falls.  Memory and ability to understand (cognition).  Work and work Astronomer. Screening  You may have the following tests or measurements:  Height, weight, and BMI.  Blood pressure.  Lipid and cholesterol levels. These may be checked every 5 years, or more frequently  if you are over 51 years old.  Skin check.  Lung cancer screening. You may have this screening every year starting at age 20 if you have a 30-pack-year history of smoking and currently smoke or have quit within the past 15 years.  Fecal occult blood test (FOBT) of the stool. You may have this test every year starting at age 87.  Flexible sigmoidoscopy or colonoscopy. You may have a sigmoidoscopy every 5 years or a colonoscopy  every 10 years starting at age 67.  Prostate cancer screening. Recommendations will vary depending on your family history and other risks.  Hepatitis C blood test.  Hepatitis B blood test.  Sexually transmitted disease (STD) testing.  Diabetes screening. This is done by checking your blood sugar (glucose) after you have not eaten for a while (fasting). You may have this done every 1-3 years.  Abdominal aortic aneurysm (AAA) screening. You may need this if you are a current or former smoker.  Osteoporosis. You may be screened starting at age 17 if you are at high risk. Talk with your health care provider about your test results, treatment options, and if necessary, the need for more tests. Vaccines  Your health care provider may recommend certain vaccines, such as:  Influenza vaccine. This is recommended every year.  Tetanus, diphtheria, and acellular pertussis (Tdap, Td) vaccine. You may need a Td booster every 10 years.  Zoster vaccine. You may need this after age 56.  Pneumococcal 13-valent conjugate (PCV13) vaccine. One dose is recommended after age 62.  Pneumococcal polysaccharide (PPSV23) vaccine. One dose is recommended after age 40. Talk to your health care provider about which screenings and vaccines you need and how often you need them. This information is not intended to replace advice given to you by your health care provider. Make sure you discuss any questions you have with your health care provider. Document Released: 08/19/2015 Document Revised: 04/11/2016 Document Reviewed: 05/24/2015 Elsevier Interactive Patient Education  2017 [employer].  Fall Prevention in the Home Falls can cause injuries. They can happen to people of all ages. There are many things you can do to make your home safe and to help prevent falls. What can I do on the outside of my home?  Regularly fix the edges of walkways and driveways and fix any cracks.  Remove anything that might make  you trip as you walk through a door, such as a raised step or threshold.  Trim any bushes or trees on the path to your home.  Use bright outdoor lighting.  Clear any walking paths of anything that might make someone trip, such as rocks or tools.  Regularly check to see if handrails are loose or broken. Make sure that both sides of any steps have handrails.  Any raised decks and porches should have guardrails on the edges.  Have any leaves, snow, or ice cleared regularly.  Use sand or salt on walking paths during winter.  Clean up any spills in your garage right away. This includes oil or grease spills. What can I do in the bathroom?  Use night lights.  Install grab bars by the toilet and in the tub and shower. Do not use towel bars as grab bars.  Use non-skid mats or decals in the tub or shower.  If you need to sit down in the shower, use a plastic, non-slip stool.  Keep the floor dry. Clean up any water that spills on the floor as soon as it happens.  Remove soap buildup in the tub or shower regularly.  Attach bath mats securely with double-sided non-slip rug tape.  Do not have throw rugs and other things on the floor that can make you trip. What can I do in the bedroom?  Use night lights.  Make sure that you have a light by your bed that is easy to reach.  Do not use any sheets or blankets that are too big for your bed. They should not hang down onto the floor.  Have a firm chair that has side arms. You can use this for support while you get dressed.  Do not have throw rugs and other things on the floor that can make you trip. What can I do in the kitchen?  Clean up any spills right away.  Avoid walking on wet floors.  Keep items that you use a lot in easy-to-reach places.  If you need to reach something above you, use a strong step stool that has a grab bar.  Keep electrical cords out of the way.  Do not use floor polish or wax that makes floors slippery.  If you must use wax, use non-skid floor wax.  Do not have throw rugs and other things on the floor that can make you trip. What can I do with my stairs?  Do not leave any items on the stairs.  Make sure that there are handrails on both sides of the stairs and use them. Fix handrails that are broken or loose. Make sure that handrails are as long as the stairways.  Check any carpeting to make sure that it is firmly attached to the stairs. Fix any carpet that is loose or worn.  Avoid having throw rugs at the top or bottom of the stairs. If you do have throw rugs, attach them to the floor with carpet tape.  Make sure that you have a light switch at the top of the stairs and the bottom of the stairs. If you do not have them, ask someone to add them for you. What else can I do to help prevent falls?  Wear shoes that:  Do not have high heels.  Have rubber bottoms.  Are comfortable and fit you well.  Are closed at the toe. Do not wear sandals.  If you use a stepladder:  Make sure that it is fully opened. Do not climb a closed stepladder.  Make sure that both sides of the stepladder are locked into place.  Ask someone to hold it for you, if possible.  Clearly mark and make sure that you can see:  Any grab bars or handrails.  First and last steps.  Where the edge of each step is.  Use tools that help you move around (mobility aids) if they are needed. These include:  Canes.  Walkers.  Scooters.  Crutches.  Turn on the lights when you go into a dark area. Replace any light bulbs as soon as they burn out.  Set up your furniture so you have a clear path. Avoid moving your furniture around.  If any of your floors are uneven, fix them.  If there are any pets around you, be aware of where they are.  Review your medicines with your doctor. Some medicines can make you feel dizzy. This can increase your chance of falling. Ask your doctor what other things that you can do to  help prevent falls. This information is not intended to replace advice given to you by your health  care provider. Make sure you discuss any questions you have with your health care provider. Document Released: 05/19/2009 Document Revised: 12/29/2015 Document Reviewed: 08/27/2014 Elsevier Interactive Patient Education  2017 [employer].

## 2020-12-09 ENCOUNTER — Other Ambulatory Visit: Payer: Self-pay | Admitting: Family Medicine

## 2020-12-09 DIAGNOSIS — E785 Hyperlipidemia, unspecified: Secondary | ICD-10-CM

## 2020-12-09 DIAGNOSIS — E1165 Type 2 diabetes mellitus with hyperglycemia: Secondary | ICD-10-CM

## 2020-12-09 DIAGNOSIS — I25118 Atherosclerotic heart disease of native coronary artery with other forms of angina pectoris: Secondary | ICD-10-CM

## 2020-12-09 DIAGNOSIS — IMO0002 Reserved for concepts with insufficient information to code with codable children: Secondary | ICD-10-CM

## 2020-12-12 ENCOUNTER — Other Ambulatory Visit: Payer: Self-pay

## 2020-12-12 ENCOUNTER — Emergency Department
Admission: EM | Admit: 2020-12-12 | Discharge: 2020-12-12 | Disposition: A | Discharge status: Home / Self Care | Payer: 59 | Attending: Emergency Medicine | Admitting: Emergency Medicine

## 2020-12-12 ENCOUNTER — Encounter (INDEPENDENT_AMBULATORY_CARE_PROVIDER_SITE_OTHER): Payer: Self-pay | Admitting: Nurse Practitioner

## 2020-12-12 DIAGNOSIS — I5042 Chronic combined systolic (congestive) and diastolic (congestive) heart failure: Secondary | ICD-10-CM | POA: Diagnosis not present

## 2020-12-12 DIAGNOSIS — Z794 Long term (current) use of insulin: Secondary | ICD-10-CM | POA: Insufficient documentation

## 2020-12-12 DIAGNOSIS — I25119 Atherosclerotic heart disease of native coronary artery with unspecified angina pectoris: Secondary | ICD-10-CM | POA: Insufficient documentation

## 2020-12-12 DIAGNOSIS — N183 Chronic kidney disease, stage 3 unspecified: Secondary | ICD-10-CM | POA: Diagnosis not present

## 2020-12-12 DIAGNOSIS — Z7982 Long term (current) use of aspirin: Secondary | ICD-10-CM | POA: Insufficient documentation

## 2020-12-12 DIAGNOSIS — Z7951 Long term (current) use of inhaled steroids: Secondary | ICD-10-CM | POA: Diagnosis not present

## 2020-12-12 DIAGNOSIS — Z87891 Personal history of nicotine dependence: Secondary | ICD-10-CM | POA: Insufficient documentation

## 2020-12-12 DIAGNOSIS — I130 Hypertensive heart and chronic kidney disease with heart failure and stage 1 through stage 4 chronic kidney disease, or unspecified chronic kidney disease: Secondary | ICD-10-CM | POA: Diagnosis not present

## 2020-12-12 DIAGNOSIS — E860 Dehydration: Secondary | ICD-10-CM | POA: Diagnosis not present

## 2020-12-12 DIAGNOSIS — Z955 Presence of coronary angioplasty implant and graft: Secondary | ICD-10-CM | POA: Insufficient documentation

## 2020-12-12 DIAGNOSIS — J449 Chronic obstructive pulmonary disease, unspecified: Secondary | ICD-10-CM | POA: Insufficient documentation

## 2020-12-12 DIAGNOSIS — R55 Syncope and collapse: Secondary | ICD-10-CM | POA: Insufficient documentation

## 2020-12-12 DIAGNOSIS — Z79899 Other long term (current) drug therapy: Secondary | ICD-10-CM | POA: Insufficient documentation

## 2020-12-12 DIAGNOSIS — E1122 Type 2 diabetes mellitus with diabetic chronic kidney disease: Secondary | ICD-10-CM | POA: Insufficient documentation

## 2020-12-12 LAB — COMPREHENSIVE METABOLIC PANEL
ALT: 24 U/L (ref 0–44)
AST: 18 U/L (ref 15–41)
Albumin: 3 g/dL — ABNORMAL LOW (ref 3.5–5.0)
Alkaline Phosphatase: 92 U/L (ref 38–126)
Anion gap: 6 (ref 5–15)
BUN: 33 mg/dL — ABNORMAL HIGH (ref 8–23)
CO2: 22 mmol/L (ref 22–32)
Calcium: 8.3 mg/dL — ABNORMAL LOW (ref 8.9–10.3)
Chloride: 110 mmol/L (ref 98–111)
Creatinine, Ser: 1.24 mg/dL (ref 0.61–1.24)
GFR, Estimated: >60 mL/min (ref >60)
Glucose, Bld: 141 mg/dL — ABNORMAL HIGH (ref 70–99)
Potassium: 4.1 mmol/L (ref 3.5–5.1)
Sodium: 138 mmol/L (ref 135–145)
Total Bilirubin: 0.5 mg/dL (ref 0.3–1.2)
Total Protein: 6 g/dL — ABNORMAL LOW (ref 6.5–8.1)

## 2020-12-12 LAB — CBC
HCT: 42.4 % (ref 39.0–52.0)
Hemoglobin: 13.4 g/dL (ref 13.0–17.0)
MCH: 30 pg (ref 26.0–34.0)
MCHC: 31.6 g/dL (ref 30.0–36.0)
MCV: 94.9 fL (ref 80.0–100.0)
Platelets: 239 10*3/uL (ref 150–400)
RBC: 4.47 MIL/uL (ref 4.22–5.81)
RDW: 15.3 % (ref 11.5–15.5)
WBC: 9.9 10*3/uL (ref 4.0–10.5)
nRBC: 0 % (ref 0.0–0.2)

## 2020-12-12 LAB — TROPONIN I (HIGH SENSITIVITY): Troponin I (High Sensitivity): 9 ng/L (ref <18)

## 2020-12-12 MED ORDER — SODIUM CHLORIDE 0.9 % IV SOLN
1000.0000 mL | Freq: Once | INTRAVENOUS | Status: AC
  Administered 2020-12-12: 1000 mL via INTRAVENOUS

## 2020-12-12 NOTE — ED Provider Notes (Signed)
After IVFs patient's blood pressure did drop after sitting up. However patient stated that he felt much better and requested discharge home. Did discuss with patient that his blood pressure did drop however given that he is feeling better do think it is reasonable to discharge home. Discussed with patient importance of follow up and hydration.   Phineas Semen, MD 12/12/20 [phone]

## 2020-12-12 NOTE — ED Notes (Signed)
Called [facility] for transport back to [facility] [place] [facility] @ 5:30

## 2020-12-12 NOTE — ED Triage Notes (Signed)
From [place] [facility] via EMS for eval of "not feeling well." Pt woke up this morning feeling well, was given all of his meds and then began to feels dizzy and lightheaded. Pt was orthostatic. EMS started IV and gave 700 cc bolus NS PTA. Pt alert and oriented on arrival, denies pain.

## 2020-12-12 NOTE — Discharge Instructions (Addendum)
Please seek medical attention for any high fevers, chest pain, shortness of breath, change in behavior, persistent vomiting, bloody stool or any other new or concerning symptoms.  

## 2020-12-12 NOTE — Progress Notes (Signed)
Subjective:    Patient ID: Danny Scrape Sr., male    DOB: 1951-01-29, 70 y.o.   MRN: [id number] No chief complaint on file.   The patient presents today for evaluation of his left below-knee stump after amputation on 11/10/2020.  The patient's biggest complaint is in regards to phantom limb pain.  The patient is doing an good job of keeping his knee straight.  He denies foul-smelling odors or excess drainage.  The wound is intact however there is a small area of eschar across the stump.  Besides the phantom limb pain the patient denies direct pain from the stump.  He denies any fevers or chills.   Review of Systems  Musculoskeletal: Positive for gait problem.  Skin: Positive for wound.  All other systems reviewed and are negative.      Objective:   Physical Exam Vitals reviewed.  HENT:     Head: Normocephalic.  Cardiovascular:     Rate and Rhythm: Normal rate.  Pulmonary:     Effort: Pulmonary effort is normal.  Neurological:     Mental Status: He is alert and oriented to person, place, and time.     Motor: Weakness present.     Gait: Gait abnormal.  Psychiatric:        Mood and Affect: Mood normal.        Behavior: Behavior normal.        Thought Content: Thought content normal.        Judgment: Judgment normal.     BP 113/77   Pulse 98   Past Medical History:  Diagnosis Date  . CAD (coronary artery disease)    a. 01/2010 PCI of LAD; b. 09/2014 PCI/DES of mLAD due to ISR. D1 80, D2 80(jailed), LCX 28m; c. 07/2016 NSTEMI/Cath: LM nl, LAD 67m ISR, 40d, D1 90ost, 80p, D2 90ost, RI min irregs, LCX min irregs, OM1 90 small, OM2/3 min irregs, RCA min irregs, RPLB1 90, EF 25-35%.  . Chronic combined systolic and diastolic CHF (congestive heart failure) (HCC)    a. 07/2016 Echo: EF 30%, severe septal/anterior HK, Gr1 DD.  [name] CKD (chronic kidney disease), stage III (HCC)   . COPD (chronic obstructive pulmonary disease) (HCC)   . DM2 (diabetes mellitus, type 2) (HCC)   .  Erectile dysfunction   . HLD (hyperlipidemia)   . HTN (hypertension)   . Ischemic cardiomyopathy    a. 07/2016 Echo: EF 30% w/ sev septal/ant HK. Gr1 DD.    Social History   Socioeconomic History  . Marital status: Single    Spouse name: Not on file  . Number of children: Not on file  . Years of education: Not on file  . Highest education level: Not on file  Occupational History  . Not on file  Tobacco Use  . Smoking status: Former Smoker    Packs/day: 3.00    Years: 0.00    Pack years: 0.00    Types: Cigarettes    Quit date: 09/07/2009    Years since quitting: 11.2  . Smokeless tobacco: Never Used  . Tobacco comment: quit june 2011  Vaping Use  . Vaping Use: Never used  Substance and Sexual Activity  . Alcohol use: Yes    Comment: every other weekend  . Drug use: No  . Sexual activity: Not on file  Other Topics Concern  . Not on file  Social History Narrative  . Not on file   Social Determinants of Health   Financial  Resource Strain: Low Risk   . Difficulty of Paying Living Expenses: Not hard at all  Food Insecurity: No Food Insecurity  . Worried About Charity fundraiser in the Last Year: Never true  . Ran Out of Food in the Last Year: Never true  Transportation Needs: No Transportation Needs  . Lack of Transportation (Medical): No  . Lack of Transportation (Non-Medical): No  Physical Activity: Not on file  Stress: No Stress Concern Present  . Feeling of Stress : Only a little  Social Connections: Unknown  . Frequency of Communication with Friends and Family: Not on file  . Frequency of Social Gatherings with Friends and Family: More than three times a week  . Attends Religious Services: Not on file  . Active Member of Clubs or Organizations: Not on file  . Attends Archivist Meetings: Not on file  . Marital Status: Not on file  Intimate Partner Violence: Not At Risk  . Fear of Current or Ex-Partner: No  . Emotionally Abused: No  . Physically  Abused: No  . Sexually Abused: No    Past Surgical History:  Procedure Laterality Date  . AMPUTATION Left 11/10/2020   Procedure: AMPUTATION BELOW KNEE;  Surgeon: [name], MD;  Location: [facility];  Service: General;  Laterality: Left;  . CAD: stent to the LAD    . CARDIAC CATHETERIZATION  10/01/2014  . CARDIAC CATHETERIZATION N/A 07/23/2016   Procedure: Left Heart Cath and Coronary Angiography;  Surgeon: [name], MD;  Location: [place] [facility];  Service: Cardiovascular;  Laterality: N/A;  . CORONARY ANGIOPLASTY WITH STENT PLACEMENT  10/01/2014  . LOWER EXTREMITY ANGIOGRAPHY Left 10/31/2020   Procedure: LOWER EXTREMITY ANGIOGRAPHY;  Surgeon: [name], MD;  Location: [place] [facility];  Service: Cardiovascular;  Laterality: Left;    Family History  Problem Relation Age of Onset  . Heart attack Father        complications  . Cancer Brother     No Known Allergies  CBC Latest Ref Rng & Units 11/28/2020 11/12/2020 11/11/2020  WBC 4.0 - 10.5 K/uL 10.3 13.8(H) 18.0(H)  Hemoglobin 13.0 - 17.0 g/dL 13.3 11.1(L) 12.2(L)  Hematocrit 39.0 - 52.0 % 42.0 33.8(L) 37.7(L)  Platelets 150 - 400 K/uL 397 486(H) 519(H)      CMP     Component Value Date/Time   NA 140 11/13/2020 0447   NA 135 11/23/2014 2006   K 3.7 11/13/2020 0447   K 4.0 11/23/2014 2006   CL 109 11/13/2020 0447   CL 100 (L) 11/23/2014 2006   CO2 25 11/13/2020 0447   CO2 24 11/23/2014 2006   GLUCOSE 134 (H) 11/13/2020 0447   GLUCOSE 370 (H) 11/23/2014 2006   BUN 22 11/13/2020 0447   BUN 40 (H) 11/23/2014 2006   CREATININE 0.98 11/13/2020 0447   CREATININE 2.16 (H) 11/23/2014 2006   CALCIUM 8.3 (L) 11/13/2020 0447   CALCIUM 8.6 (L) 11/23/2014 2006   PROT 6.5 11/09/2020 0509   PROT 7.3 11/23/2014 2006   ALBUMIN 2.4 (L) 11/09/2020 0509   ALBUMIN 4.0 11/23/2014 2006   AST 46 (H) 11/09/2020 0509   AST 18 11/23/2014 2006   ALT 60 (H) 11/09/2020 0509   ALT 14 (L) 11/23/2014 2006   ALKPHOS 120  11/09/2020 0509   ALKPHOS 96 11/23/2014 2006   BILITOT 0.8 11/09/2020 0509   BILITOT 0.6 11/23/2014 2006   GFRNONAA >60 11/13/2020 0447   GFRNONAA 31 (L) 11/23/2014 2006  GFRAA 37 (L) 12/24/2019 1608   GFRAA 36 (L) 11/23/2014 2006     VAS [place] ABI WITH/WO TBI  Result Date: 11/04/2020 LOWER EXTREMITY DOPPLER STUDY Indications: Peripheral artery disease. High Risk         Hypertension, hyperlipidemia, diabetes, coronary artery Factors:          disease.  Comparison Study: 01/01/2018 Performing Technologist: Almira Coaster RVS  Examination Guidelines: A complete evaluation includes at minimum, Doppler waveform signals and systolic blood pressure reading at the level of bilateral brachial, anterior tibial, and posterior tibial arteries, when vessel segments are accessible. Bilateral testing is considered an integral part of a complete examination. Photoelectric Plethysmograph (PPG) waveforms and toe systolic pressure readings are included as required and additional duplex testing as needed. Limited examinations for reoccurring indications may be performed as noted.  ABI Findings: +---------+------------------+-----+----------+--------+ Right    Rt Pressure (mmHg)IndexWaveform  Comment  +---------+------------------+-----+----------+--------+ Brachial 170                                       +---------+------------------+-----+----------+--------+ ATA      196               1.15 monophasic         +---------+------------------+-----+----------+--------+ PTA      250               1.47 monophasicNC       +---------+------------------+-----+----------+--------+ Great Toe91                0.54 Abnormal           +---------+------------------+-----+----------+--------+ +---------+------------------+-----+----------+------------------------+ Left     Lt Pressure (mmHg)IndexWaveform  Comment                   +---------+------------------+-----+----------+------------------------+ Brachial                                  glucose monitor in place +---------+------------------+-----+----------+------------------------+ PTA      131               0.77 monophasic                         +---------+------------------+-----+----------+------------------------+ PERO     114               0.67 monophasic                         +---------+------------------+-----+----------+------------------------+ Great Toe45                0.26 Abnormal  2nd Toe                  +---------+------------------+-----+----------+------------------------+ +-------+-----------+-----------+------------+------------+ ABI/TBIToday's ABIToday's TBIPrevious ABIPrevious TBI +-------+-----------+-----------+------------+------------+ Right  >1.0 [place]    .54        .82                      +-------+-----------+-----------+------------+------------+ Left   .77        .26        1.21                     +-------+-----------+-----------+------------+------------+ Right ABIs appear increased compared to prior study on 01/01/2018. Left ABIs appear decreased compared to prior study on 01/01/2018.  Summary: Right: Resting right ankle-brachial index indicates noncompressible right lower extremity arteries. The right toe-brachial index is abnormal. Left: Resting left ankle-brachial index indicates moderate left lower extremity arterial disease. The left toe-brachial index is abnormal.  *See table(s) above for measurements and observations.  Electronically signed by Leotis Pain MD on 11/04/2020 at 12:10:40 PM.    Final        Assessment & Plan:   1. Left below-knee amputee ([place]) The patient's wound has a thin layer of eschar across the medial portion.  Otherwise there is no extensive drainage or signs symptoms of infection.  The patient's staples were removed today and Steri-Strips applied.  The patient is also advised to  try to keep his knee as straight as possible.  Dry dressing is to be applied.  The patient will follow up in 4 weeks for wound evaluation.  Will also increase his nighttime dose of gabapentin to 300 mg to help with his phantom limb pain. 2. Pure hypercholesterolemia Continue statin as ordered and reviewed, no changes at this time    Current Outpatient Medications on File Prior to Visit  Medication Sig Dispense Refill  . ACCU-CHEK GUIDE test strip CHECK CBGS 4 TIMES A DAY 300 strip 1  . albuterol (PROVENTIL) (2.5 MG/3ML) 0.083% nebulizer solution Take 3 mLs (2.5 mg total) by nebulization every 6 (six) hours as needed for wheezing or shortness of breath. 150 mL 1  . aspirin 81 MG tablet Take 81 mg by mouth daily.    . baclofen (LIORESAL) 10 MG tablet TAKE 1 TABLET BY MOUTH THREE TIMES A DAY AS NEEDED FOR MUSCLE SPASMS 30 tablet 0  . blood glucose meter kit and supplies KIT Dispense based on patient and insurance preference. Check CBGs 4 times daily. Dx code E11.9. 1 each 0  . carvedilol (COREG) 12.5 MG tablet TAKE 1 TABLET (12.5 MG TOTAL) BY MOUTH 2 (TWO) TIMES DAILY WITH A MEAL. 180 tablet 3  . clopidogrel (PLAVIX) 75 MG tablet TAKE 1 TABLET BY MOUTH EVERY DAY 90 tablet 3  . Dulaglutide (TRULICITY) 4.5 RC/7.8LF SOPN Inject 4.5 mg as directed once a week. 6 mL 3  . escitalopram (LEXAPRO) 10 MG tablet TAKE 1 TABLET BY MOUTH EVERY DAY 90 tablet 1  . ezetimibe (ZETIA) 10 MG tablet TAKE 1 TABLET BY MOUTH EVERY DAY 90 tablet 3  . furosemide (LASIX) 20 MG tablet TAKE 1 TABLET (20 MG) BY MOUTH ONCE EVERY OTHER DAY 45 tablet 3  . insulin degludec (TRESIBA FLEXTOUCH) 100 UNIT/ML FlexTouch Pen Inject 25 Units into the skin daily. 45 mL 3  . Insulin Pen Needle (PEN NEEDLES) 32G X 6 MM MISC 1 each by Does not apply route 2 (two) times daily. 100 each 11  . isosorbide mononitrate (IMDUR) 30 MG 24 hr tablet TAKE 1 TABLET BY MOUTH EVERY DAY 90 tablet 3  . nitroGLYCERIN (NITROSTAT) 0.4 MG SL tablet Place 1  tablet (0.4 mg total) under the tongue every 5 (five) minutes as needed for chest pain. Maximum of 3 doses. 25 tablet 3  . oxyCODONE-acetaminophen (PERCOCET) 7.5-325 MG tablet Take 1 tablet by mouth every 8 (eight) hours as needed for severe pain. 12 tablet 0  . sacubitril-valsartan (ENTRESTO) 49-51 MG Take 1 tablet by mouth 2 (two) times daily. 60 tablet 0  . spironolactone (ALDACTONE) 25 MG tablet Take 1 tablet (25 mg total) by mouth daily.    . tamsulosin (FLOMAX) 0.4 MG CAPS capsule TAKE 1 CAPSULE BY MOUTH EVERY DAY 90  capsule 0  . VENTOLIN HFA 108 (90 Base) MCG/ACT inhaler Inhale 2 puffs into the lungs every 6 (six) hours as needed for wheezing or shortness of breath. 18 g 3  . fluticasone-salmeterol (WIXELA INHUB) 250-50 MCG/ACT AEPB Inhale 1 puff into the lungs 2 (two) times daily.     No current facility-administered medications on file prior to visit.    There are no Patient Instructions on file for this visit. No follow-ups on file.   Kris Hartmann, NP

## 2020-12-12 NOTE — ED Notes (Signed)
Orthostatics completed - pt reports feeling well, no dizziness or lightheadedness. States he is ready to get back and eat some supper

## 2020-12-12 NOTE — ED Provider Notes (Signed)
[facility] Emergency Department Provider Note   ____________________________________________    I have reviewed the triage vital signs and the nursing notes.   HISTORY  Chief Complaint Near Syncope     HPI Danny BROSSEAU Sr. is a 70 y.o. male with a history of CAD, CKD, COPD, diabetes who presents after a near syncopal episode.  Patient reports he got up this morning was feeling okay, took his medications and went back to sleep.  States got up afterwards and when he stood up felt lightheaded.  Denies chest pain or palpitations.  No nausea vomiting.  Is feeling better now.  No fevers chills or cough.  No new medications.  Past Medical History:  Diagnosis Date  . CAD (coronary artery disease)    a. 01/2010 PCI of LAD; b. 09/2014 PCI/DES of mLAD due to ISR. D1 80, D2 80(jailed), LCX 58m; c. 07/2016 NSTEMI/Cath: LM nl, LAD 5m ISR, 40d, D1 90ost, 80p, D2 90ost, RI min irregs, LCX min irregs, OM1 90 small, OM2/3 min irregs, RCA min irregs, RPLB1 90, EF 25-35%.  . Chronic combined systolic and diastolic CHF (congestive heart failure) ([place])    a. 07/2016 Echo: EF 30%, severe septal/anterior HK, Gr1 DD.  [name] CKD (chronic kidney disease), stage III ([place])   . COPD (chronic obstructive pulmonary disease) ([place])   . DM2 (diabetes mellitus, type 2) ([place])   . Erectile dysfunction   . HLD (hyperlipidemia)   . HTN (hypertension)   . Ischemic cardiomyopathy    a. 07/2016 Echo: EF 30% w/ sev septal/ant HK. Gr1 DD.    Patient Active Problem List   Diagnosis Date Noted  . Reactive thrombocytosis 11/14/2020  . Chronic systolic heart failure ([place])   . Cellulitis of left foot 11/09/2020  . HFrEF (heart failure with reduced ejection fraction) ([place])   . Thrush   . Sepsis ([place]) 11/06/2020  . Gangrene of left foot ([place])   . PVD (peripheral vascular disease) ([place])   . Stage 2 chronic kidney disease   . Atherosclerosis of native arteries of the extremities with gangrene  ([place]) 10/28/2020  . Low back pain 06/29/2020  . Onychomycosis 03/29/2020  . Fall 03/29/2020  . AKI (acute kidney injury) ([place]) 12/28/2019  . Coronary artery disease of native artery of native heart with stable angina pectoris ([place]) 12/12/2018  . Pure hypercholesterolemia 12/12/2018  . Chest pain 10/31/2018  . Atherosclerosis of native arteries of extremity with intermittent claudication ([place]) 02/11/2018  . Decreased pedal pulses 07/02/2017  . Orthostasis 05/07/2017  . Anxiety and depression 05/07/2017  . CKD (chronic kidney disease) stage 3, GFR 30-59 ml/min (H[facility]) 03/06/2017  . Diabetic neuropathy ([place]) 03/06/2017  . Chronic diastolic CHF (congestive heart failure) ([place]) 03/05/2017  . Morbid obesity ([place]) 06/18/2016  . H/O medication noncompliance 06/18/2016  . COPD with chronic bronchitis ([place]) 06/20/2015  . GERD (gastroesophageal reflux disease) 12/20/2014  . Type 2 diabetes mellitus with hyperlipidemia ([place]) 06/05/2010  . Hyperlipidemia 02/10/2010  . Essential hypertension 02/10/2010  . CAD (coronary artery disease) 02/10/2010    Past Surgical History:  Procedure Laterality Date  . AMPUTATION Left 11/10/2020   Procedure: AMPUTATION BELOW KNEE;  Surgeon: [name], MD;  Location: [facility] ORS;  Service: General;  Laterality: Left;  . BELOW KNEE LEG AMPUTATION    . CAD: stent to the LAD    . CARDIAC CATHETERIZATION  10/01/2014  . CARDIAC CATHETERIZATION N/A 07/23/2016   Procedure: Left Heart Cath and Coronary Angiography;  Surgeon: [name], MD;  Location: [place] [facility];  Service: Cardiovascular;  Laterality: N/A;  . CORONARY ANGIOPLASTY WITH STENT PLACEMENT  10/01/2014  . LOWER EXTREMITY ANGIOGRAPHY Left 10/31/2020   Procedure: LOWER EXTREMITY ANGIOGRAPHY;  Surgeon: [name], MD;  Location: [place] [facility];  Service: Cardiovascular;  Laterality: Left;    Prior to Admission medications   Medication Sig Start Date End Date Taking? Authorizing Provider   ACCU-CHEK GUIDE test strip CHECK CBGS 4 TIMES A DAY 11/16/19   [name], MD  albuterol (PROVENTIL) (2.5 MG/3ML) 0.083% nebulizer solution Take 3 mLs (2.5 mg total) by nebulization every 6 (six) hours as needed for wheezing or shortness of breath. 09/30/20   [name], MD  aspirin 81 MG tablet Take 81 mg by mouth daily.    [provider]  atorvastatin (LIPITOR) 80 MG tablet TAKE 1 TABLET BY MOUTH EVERY DAY 12/09/20   [name], MD  baclofen (LIORESAL) 10 MG tablet TAKE 1 TABLET BY MOUTH THREE TIMES A DAY AS NEEDED FOR MUSCLE SPASMS 10/24/20   [name], MD  blood glucose meter kit and supplies KIT Dispense based on patient and insurance preference. Check CBGs 4 times daily. Dx code E11.9. 09/17/19   [name], MD  carvedilol (COREG) 12.5 MG tablet TAKE 1 TABLET (12.5 MG TOTAL) BY MOUTH 2 (TWO) TIMES DAILY WITH A MEAL. 02/29/20   [name], MD  clopidogrel (PLAVIX) 75 MG tablet TAKE 1 TABLET BY MOUTH EVERY DAY 02/09/20   Gollan, Kathlene November, MD  Dulaglutide (TRULICITY) 4.5 YN/8.2NF SOPN Inject 4.5 mg as directed once a week. 07/11/20   [name], MD  escitalopram (LEXAPRO) 10 MG tablet TAKE 1 TABLET BY MOUTH EVERY DAY 05/25/20   Einar Pheasant, MD  ezetimibe (ZETIA) 10 MG tablet TAKE 1 TABLET BY MOUTH EVERY DAY 02/29/20   [name], MD  fluticasone-salmeterol [facility] INHUB) 250-50 MCG/ACT AEPB Inhale 1 puff into the lungs 2 (two) times daily. 11/13/20   [provider]  furosemide (LASIX) 20 MG tablet TAKE 1 TABLET (20 MG) BY MOUTH ONCE EVERY OTHER DAY 03/21/20   [name], MD  gabapentin (NEURONTIN) 300 MG capsule Take 1 capsule (300 mg total) by mouth at bedtime. 12/06/20   Kris Hartmann, NP  insulin degludec (TRESIBA FLEXTOUCH) 100 UNIT/ML FlexTouch Pen Inject 25 Units into the skin daily. 11/15/20   Sharen Hones, MD  Insulin Pen Needle (PEN NEEDLES) 32G X 6 MM MISC 1 each by Does not apply route 2 (two) times  daily. 11/17/19   [name], MD  isosorbide mononitrate (IMDUR) 30 MG 24 hr tablet TAKE 1 TABLET BY MOUTH EVERY DAY 02/09/20   [name], MD  JARDIANCE 25 MG TABS tablet TAKE 1 TABLET BY MOUTH DAILY 12/09/20   [name], MD  nitroGLYCERIN (NITROSTAT) 0.4 MG SL tablet Place 1 tablet (0.4 mg total) under the tongue every 5 (five) minutes as needed for chest pain. Maximum of 3 doses. 10/26/20   Marrianne Mood D, PA-C  oxyCODONE-acetaminophen (PERCOCET) 7.5-325 MG tablet Take 1 tablet by mouth every 8 (eight) hours as needed for severe pain. 11/15/20   Sharen Hones, MD  sacubitril-valsartan (ENTRESTO) 49-51 MG Take 1 tablet by mouth 2 (two) times daily. 11/15/20   Sharen Hones, MD  spironolactone (ALDACTONE) 25 MG tablet Take 1 tablet (25 mg total) by mouth daily. 11/15/20   Sharen Hones, MD  tamsulosin (  FLOMAX) 0.4 MG CAPS capsule TAKE 1 CAPSULE BY MOUTH EVERY DAY 07/04/20   Einar Pheasant, MD  VENTOLIN HFA 108 (90 Base) MCG/ACT inhaler Inhale 2 puffs into the lungs every 6 (six) hours as needed for wheezing or shortness of breath. 09/17/19   [name], MD     Allergies Patient has no known allergies.  Family History  Problem Relation Age of Onset  . Heart attack Father        complications  . Cancer Brother     Social History Social History   Tobacco Use  . Smoking status: Former Smoker    Packs/day: 3.00    Years: 0.00    Pack years: 0.00    Types: Cigarettes    Quit date: 09/07/2009    Years since quitting: 11.2  . Smokeless tobacco: Never Used  . Tobacco comment: quit june 2011  Vaping Use  . Vaping Use: Never used  Substance Use Topics  . Alcohol use: Yes    Comment: every other weekend  . Drug use: No    Review of Systems  Constitutional: No fever/chills Eyes: No visual changes.  ENT: No sore throat. Cardiovascular: Denies chest pain. Respiratory: Denies shortness of breath. Gastrointestinal: No abdominal pain.    Genitourinary: Negative  for dysuria. Musculoskeletal: Negative for back pain. Skin: Negative for rash. Neurological: Negative for headaches   ____________________________________________   PHYSICAL EXAM:  VITAL SIGNS: ED Triage Vitals  Enc Vitals Group     BP 12/12/20 1335 (!) 116/104     Pulse Rate 12/12/20 1335 82     Resp 12/12/20 1335 18     Temp 12/12/20 1335 (!) 97.5 F (36.4 C)     Temp src --      SpO2 12/12/20 1335 96 %     Weight 12/12/20 1341 94.8 kg (209 lb)     Height 12/12/20 1341 1.778 m ($Remove'5\' 10"'tvCFcDT$ )     Head Circumference --      Peak Flow --      Pain Score 12/12/20 1341 0     Pain Loc --      Pain Edu? --      Excl. in [place]? --     Constitutional: Alert and oriented.   Nose: No congestion/rhinnorhea. Mouth/Throat: Mucous membranes are moist.    Cardiovascular: Normal rate, regular rhythm. Kermit Balo peripheral circulation. Respiratory: Normal respiratory effort.  No retractions. Lungs CTAB. Gastrointestinal: Soft and nontender. No distention.    Musculoskeletal: left BKA Neurologic:  Normal speech and language. No gross focal neurologic deficits are appreciated.  Skin:  Skin is warm, dry and intact. No rash noted. Psychiatric: Mood and affect are normal. Speech and behavior are normal.  ____________________________________________   LABS (all labs ordered are listed, but only abnormal results are displayed)  Labs Reviewed  COMPREHENSIVE METABOLIC PANEL - Abnormal; Notable for the following components:      Result Value   Glucose, Bld 141 (*)    BUN 33 (*)    Calcium 8.3 (*)    Total Protein 6.0 (*)    Albumin 3.0 (*)    All other components within normal limits  CBC  TROPONIN I (HIGH SENSITIVITY)   ____________________________________________  EKG  ED ECG REPORT I, Lavonia Drafts, the attending physician, personally viewed and interpreted this ECG.  Date: 12/12/2020  Rhythm: normal sinus rhythm QRS Axis: normal Intervals: normal ST/T Wave abnormalities:  normal Narrative Interpretation: no evidence of acute ischemia  ____________________________________________  RADIOLOGY  ____________________________________________   PROCEDURES  Procedure(s) performed: No  Procedures   Critical Care performed: No ____________________________________________   INITIAL IMPRESSION / ASSESSMENT AND PLAN / ED COURSE  Pertinent labs & imaging results that were available during my care of the patient were reviewed by me and considered in my medical decision making (see chart for details).  Patient presents after a near syncopal episode as described above.  He is feeling improved now.  Vital signs are overall reassuring.  Reviewed patient's past medical records, does have history of lower blood pressure which is normal for him.  High sensitive troponin is unremarkable, EKG is normal  Mild elevation of BUN/creatinine ratio, reportedly patient was orthostatic with EMS  Will give IV fluids, reevaluate orthostatics and if patient is feeling improved appropriate for discharge    ____________________________________________   FINAL CLINICAL IMPRESSION(S) / ED DIAGNOSES  Final diagnoses:  Near syncope  Dehydration        Note:  This document was prepared using Dragon voice recognition software and may include unintentional dictation errors.   Lavonia Drafts, MD 12/12/20 [phone]

## 2020-12-14 ENCOUNTER — Telehealth (INDEPENDENT_AMBULATORY_CARE_PROVIDER_SITE_OTHER): Payer: Self-pay | Admitting: Vascular Surgery

## 2020-12-14 NOTE — Telephone Encounter (Signed)
Facility called stating left leg is draining and is really red, swollen and hot to touch. Patient had ABK procedure done 11/10/20 and f/u 12/06/20 in office with FB. Wound nurse seen leg yesterday and asked for patient to come in to be seen. Please advise.

## 2020-12-14 NOTE — Telephone Encounter (Signed)
As discussed previously, we can try to see him and evaluate tomorrow morning or friday

## 2020-12-15 NOTE — Telephone Encounter (Signed)
Patient scheduled ; transportation could not get patient here sooner than tomorrow 12/16/20

## 2020-12-16 ENCOUNTER — Ambulatory Visit (INDEPENDENT_AMBULATORY_CARE_PROVIDER_SITE_OTHER): Payer: 59 | Admitting: Nurse Practitioner

## 2020-12-16 ENCOUNTER — Encounter (INDEPENDENT_AMBULATORY_CARE_PROVIDER_SITE_OTHER): Payer: Self-pay | Admitting: Nurse Practitioner

## 2020-12-16 ENCOUNTER — Other Ambulatory Visit: Payer: Self-pay

## 2020-12-16 VITALS — BP 112/69 | HR 53 | Resp 16

## 2020-12-16 DIAGNOSIS — I10 Essential (primary) hypertension: Secondary | ICD-10-CM

## 2020-12-16 DIAGNOSIS — Z89512 Acquired absence of left leg below knee: Secondary | ICD-10-CM

## 2020-12-16 MED ORDER — SILVER SULFADIAZINE 1 % EX CREA: 1.0000 "application " | TOPICAL_CREAM | Freq: Every day | CUTANEOUS | 0 refills | Status: DC

## 2020-12-16 MED ORDER — SULFAMETHOXAZOLE-TRIMETHOPRIM 800-160 MG PO TABS: 1.0000 | ORAL_TABLET | Freq: Two times a day (BID) | ORAL | 0 refills | Status: DC

## 2020-12-16 NOTE — Progress Notes (Signed)
Subjective:    Patient ID: Danny Organ Sr., male    DOB: September 25, 1950, 70 y.o.   MRN: [id number] Chief Complaint  Patient presents with  . Follow-up    Left stump drainage    Danny [name] is a 70 year old male that returns today for evaluation of his left lower extremity below-knee amputation.  The patient was recently seen and had his staples removed.  The patient's nursing home facility called due to concern for redness and drainage from his stump.  Today there is a small amount of erythema around the incision.  The patient denies any swelling of his stump or pain in his stump.  There is a scant amount of drainage seen.  No dehiscence of the wound however there is some scabbing/eschar noted in the incision portion.  Patient denies any fever or chills.  His biggest complaint is with his phantom limb pain.  Overall the patient is doing well.   Review of Systems  Skin: Positive for wound.  Neurological: Positive for weakness.  All other systems reviewed and are negative.      Objective:   Physical Exam Vitals reviewed.  HENT:     Head: Normocephalic.  Cardiovascular:     Rate and Rhythm: Normal rate.     Pulses: Normal pulses.  Pulmonary:     Effort: Pulmonary effort is normal.  Musculoskeletal:     Left Lower Extremity: Left leg is amputated below knee.  Neurological:     Mental Status: He is alert and oriented to person, place, and time.  Psychiatric:        Mood and Affect: Mood normal.        Behavior: Behavior normal.        Thought Content: Thought content normal.        Judgment: Judgment normal.     BP 112/69 (BP Location: Right Arm)   Pulse (!) 53   Resp 16   Past Medical History:  Diagnosis Date  . CAD (coronary artery disease)    a. 01/2010 PCI of LAD; b. 09/2014 PCI/DES of mLAD due to ISR. D1 80, D2 80(jailed), LCX 57m c. 07/2016 NSTEMI/Cath: LM nl, LAD 220mSR, 40d, D1 90ost, 80p, D2 90ost, RI min irregs, LCX min irregs, OM1 90 small, OM2/3 min irregs,  RCA min irregs, RPLB1 90, EF 25-35%.  . Chronic combined systolic and diastolic CHF (congestive heart failure) ([facility]   a. 07/2016 Echo: EF 30%, severe septal/anterior HK, Gr1 DD.  . Marland KitchenKD (chronic kidney disease), stage III ([facility]  . COPD (chronic obstructive pulmonary disease) ([facility]  . DM2 (diabetes mellitus, type 2) ([facility]  . Erectile dysfunction   . HLD (hyperlipidemia)   . HTN (hypertension)   . Ischemic cardiomyopathy    a. 07/2016 Echo: EF 30% w/ sev septal/ant HK. Gr1 DD.    Social History   Socioeconomic History  . Marital status: Single    Spouse name: Not on file  . Number of children: Not on file  . Years of education: Not on file  . Highest education level: Not on file  Occupational History  . Not on file  Tobacco Use  . Smoking status: Former Smoker    Packs/day: 3.00    Years: 0.00    Pack years: 0.00    Types: Cigarettes    Quit date: 09/07/2009    Years since quitting: 11.2  . Smokeless tobacco: Never Used  . Tobacco comment: quit june 2011  Vaping  Use  . Vaping Use: Never used  Substance and Sexual Activity  . Alcohol use: Yes    Comment: every other weekend  . Drug use: No  . Sexual activity: Not on file  Other Topics Concern  . Not on file  Social History Narrative  . Not on file   Social Determinants of Health   Financial Resource Strain: Low Risk   . Difficulty of Paying Living Expenses: Not hard at all  Food Insecurity: No Food Insecurity  . Worried About Charity fundraiser in the Last Year: Never true  . Ran Out of Food in the Last Year: Never true  Transportation Needs: No Transportation Needs  . Lack of Transportation (Medical): No  . Lack of Transportation (Non-Medical): No  Physical Activity: Not on file  Stress: No Stress Concern Present  . Feeling of Stress : Only a little  Social Connections: Unknown  . Frequency of Communication with Friends and Family: Not on file  . Frequency of Social Gatherings with Friends and Family: More  than three times a week  . Attends Religious Services: Not on file  . Active Member of Clubs or Organizations: Not on file  . Attends Archivist Meetings: Not on file  . Marital Status: Not on file  Intimate Partner Violence: Not At Risk  . Fear of Current or Ex-Partner: No  . Emotionally Abused: No  . Physically Abused: No  . Sexually Abused: No    Past Surgical History:  Procedure Laterality Date  . AMPUTATION Left 11/10/2020   Procedure: AMPUTATION BELOW KNEE;  Surgeon: [name], MD;  Location: [facility];  Service: General;  Laterality: Left;  . BELOW KNEE LEG AMPUTATION    . CAD: stent to the LAD    . CARDIAC CATHETERIZATION  10/01/2014  . CARDIAC CATHETERIZATION N/A 07/23/2016   Procedure: Left Heart Cath and Coronary Angiography;  Surgeon: [name], MD;  Location: [place] [facility];  Service: Cardiovascular;  Laterality: N/A;  . CORONARY ANGIOPLASTY WITH STENT PLACEMENT  10/01/2014  . LOWER EXTREMITY ANGIOGRAPHY Left 10/31/2020   Procedure: LOWER EXTREMITY ANGIOGRAPHY;  Surgeon: [name], MD;  Location: [place] [facility];  Service: Cardiovascular;  Laterality: Left;    Family History  Problem Relation Age of Onset  . Heart attack Father        complications  . Cancer Brother     No Known Allergies  CBC Latest Ref Rng & Units 12/12/2020 11/28/2020 11/12/2020  WBC 4.0 - 10.5 K/uL 9.9 10.3 13.8(H)  Hemoglobin 13.0 - 17.0 g/dL 13.4 13.3 11.1(L)  Hematocrit 39.0 - 52.0 % 42.4 42.0 33.8(L)  Platelets 150 - 400 K/uL 239 397 486(H)      CMP     Component Value Date/Time   NA 138 12/12/2020 1339   NA 135 11/23/2014 2006   K 4.1 12/12/2020 1339   K 4.0 11/23/2014 2006   CL 110 12/12/2020 1339   CL 100 (L) 11/23/2014 2006   CO2 22 12/12/2020 1339   CO2 24 11/23/2014 2006   GLUCOSE 141 (H) 12/12/2020 1339   GLUCOSE 370 (H) 11/23/2014 2006   BUN 33 (H) 12/12/2020 1339   BUN 40 (H) 11/23/2014 2006   CREATININE 1.24 12/12/2020 1339   CREATININE  2.16 (H) 11/23/2014 2006   CALCIUM 8.3 (L) 12/12/2020 1339   CALCIUM 8.6 (L) 11/23/2014 2006   PROT 6.0 (L) 12/12/2020 1339   PROT 7.3 11/23/2014 2006   ALBUMIN 3.0 (  L) 12/12/2020 1339   ALBUMIN 4.0 11/23/2014 2006   AST 18 12/12/2020 1339   AST 18 11/23/2014 2006   ALT 24 12/12/2020 1339   ALT 14 (L) 11/23/2014 2006   ALKPHOS 92 12/12/2020 1339   ALKPHOS 96 11/23/2014 2006   BILITOT 0.5 12/12/2020 1339   BILITOT 0.6 11/23/2014 2006   GFRNONAA >60 12/12/2020 1339   GFRNONAA 31 (L) 11/23/2014 2006   GFRAA 37 (L) 12/24/2019 1608   GFRAA 36 (L) 11/23/2014 2006     VAS [place] ABI WITH/WO TBI  Result Date: 11/04/2020 LOWER EXTREMITY DOPPLER STUDY Indications: Peripheral artery disease. High Risk         Hypertension, hyperlipidemia, diabetes, coronary artery Factors:          disease.  Comparison Study: 01/01/2018 Performing Technologist: Almira Coaster RVS  Examination Guidelines: A complete evaluation includes at minimum, Doppler waveform signals and systolic blood pressure reading at the level of bilateral brachial, anterior tibial, and posterior tibial arteries, when vessel segments are accessible. Bilateral testing is considered an integral part of a complete examination. Photoelectric Plethysmograph (PPG) waveforms and toe systolic pressure readings are included as required and additional duplex testing as needed. Limited examinations for reoccurring indications may be performed as noted.  ABI Findings: +---------+------------------+-----+----------+--------+ Right    Rt Pressure (mmHg)IndexWaveform  Comment  +---------+------------------+-----+----------+--------+ Brachial 170                                       +---------+------------------+-----+----------+--------+ ATA      196               1.15 monophasic         +---------+------------------+-----+----------+--------+ PTA      250               1.47 monophasicNC        +---------+------------------+-----+----------+--------+ Great Toe91                0.54 Abnormal           +---------+------------------+-----+----------+--------+ +---------+------------------+-----+----------+------------------------+ Left     Lt Pressure (mmHg)IndexWaveform  Comment                  +---------+------------------+-----+----------+------------------------+ Brachial                                  glucose monitor in place +---------+------------------+-----+----------+------------------------+ PTA      131               0.77 monophasic                         +---------+------------------+-----+----------+------------------------+ PERO     114               0.67 monophasic                         +---------+------------------+-----+----------+------------------------+ Great Toe45                0.26 Abnormal  2nd Toe                  +---------+------------------+-----+----------+------------------------+ +-------+-----------+-----------+------------+------------+ ABI/TBIToday's ABIToday's TBIPrevious ABIPrevious TBI +-------+-----------+-----------+------------+------------+ Right  >1.0 [place]    .54        .82                      +-------+-----------+-----------+------------+------------+  Left   .77        .26        1.21                     +-------+-----------+-----------+------------+------------+ Right ABIs appear increased compared to prior study on 01/01/2018. Left ABIs appear decreased compared to prior study on 01/01/2018.  Summary: Right: Resting right ankle-brachial index indicates noncompressible right lower extremity arteries. The right toe-brachial index is abnormal. Left: Resting left ankle-brachial index indicates moderate left lower extremity arterial disease. The left toe-brachial index is abnormal.  *See table(s) above for measurements and observations.  Electronically signed by Leotis Pain MD on 11/04/2020 at 12:10:40 PM.    Final         Assessment & Plan:   1. Hx of BKA, left ([place]) Today the patient does have some drainage from his incision.  The incision is not very erythemic or painful for the patient.  We will send an antibiotic to cover for possible infection.  We will also have the patient apply silver Silvadene to the wound area.  We will plan on having him back and the originally scheduled 2 weeks.  2. Essential hypertension Continue antihypertensive medications as already ordered, these medications have been reviewed and there are no changes at this time.    Current Outpatient Medications on File Prior to Visit  Medication Sig Dispense Refill  . ACCU-CHEK GUIDE test strip CHECK CBGS 4 TIMES A DAY 300 strip 1  . albuterol (PROVENTIL) (2.5 MG/3ML) 0.083% nebulizer solution Take 3 mLs (2.5 mg total) by nebulization every 6 (six) hours as needed for wheezing or shortness of breath. 150 mL 1  . aspirin 81 MG tablet Take 81 mg by mouth daily.    [name] atorvastatin (LIPITOR) 80 MG tablet TAKE 1 TABLET BY MOUTH EVERY DAY 90 tablet 3  . baclofen (LIORESAL) 10 MG tablet TAKE 1 TABLET BY MOUTH THREE TIMES A DAY AS NEEDED FOR MUSCLE SPASMS 30 tablet 0  . blood glucose meter kit and supplies KIT Dispense based on patient and insurance preference. Check CBGs 4 times daily. Dx code E11.9. 1 each 0  . carvedilol (COREG) 12.5 MG tablet TAKE 1 TABLET (12.5 MG TOTAL) BY MOUTH 2 (TWO) TIMES DAILY WITH A MEAL. 180 tablet 3  . clopidogrel (PLAVIX) 75 MG tablet TAKE 1 TABLET BY MOUTH EVERY DAY 90 tablet 3  . Dulaglutide (TRULICITY) 4.5 LD/3.5TS SOPN Inject 4.5 mg as directed once a week. 6 mL 3  . escitalopram (LEXAPRO) 10 MG tablet TAKE 1 TABLET BY MOUTH EVERY DAY 90 tablet 1  . ezetimibe (ZETIA) 10 MG tablet TAKE 1 TABLET BY MOUTH EVERY DAY 90 tablet 3  . fluticasone-salmeterol (ADVAIR) 250-50 MCG/ACT AEPB Inhale 1 puff into the lungs 2 (two) times daily.    . furosemide (LASIX) 20 MG tablet TAKE 1 TABLET (20 MG) BY MOUTH ONCE  EVERY OTHER DAY 45 tablet 3  . gabapentin (NEURONTIN) 300 MG capsule Take 1 capsule (300 mg total) by mouth at bedtime. 30 capsule 3  . insulin degludec (TRESIBA FLEXTOUCH) 100 UNIT/ML FlexTouch Pen Inject 25 Units into the skin daily. 45 mL 3  . Insulin Pen Needle (PEN NEEDLES) 32G X 6 MM MISC 1 each by Does not apply route 2 (two) times daily. 100 each 11  . isosorbide mononitrate (IMDUR) 30 MG 24 hr tablet TAKE 1 TABLET BY MOUTH EVERY DAY 90 tablet 3  . JARDIANCE 25 MG TABS tablet TAKE 1  TABLET BY MOUTH DAILY 90 tablet 3  . nitroGLYCERIN (NITROSTAT) 0.4 MG SL tablet Place 1 tablet (0.4 mg total) under the tongue every 5 (five) minutes as needed for chest pain. Maximum of 3 doses. 25 tablet 3  . oxyCODONE-acetaminophen (PERCOCET) 7.5-325 MG tablet Take 1 tablet by mouth every 8 (eight) hours as needed for severe pain. 12 tablet 0  . sacubitril-valsartan (ENTRESTO) 49-51 MG Take 1 tablet by mouth 2 (two) times daily. 60 tablet 0  . spironolactone (ALDACTONE) 25 MG tablet Take 1 tablet (25 mg total) by mouth daily.    . tamsulosin (FLOMAX) 0.4 MG CAPS capsule TAKE 1 CAPSULE BY MOUTH EVERY DAY 90 capsule 0  . VENTOLIN HFA 108 (90 Base) MCG/ACT inhaler Inhale 2 puffs into the lungs every 6 (six) hours as needed for wheezing or shortness of breath. 18 g 3   No current facility-administered medications on file prior to visit.    There are no Patient Instructions on file for this visit. No follow-ups on file.   Kris Hartmann, NP

## 2020-12-21 ENCOUNTER — Telehealth: Payer: Self-pay | Admitting: Family Medicine

## 2020-12-21 NOTE — Telephone Encounter (Signed)
Called patient to screen for appointment he informed me that he has been in hospital for 1 month has one leg will call back when he can get around.

## 2020-12-21 NOTE — Telephone Encounter (Signed)
R/s pharmacy follow up call in a few weeks to check in

## 2020-12-28 ENCOUNTER — Ambulatory Visit: Payer: Medicaid Other | Admitting: Family Medicine

## 2020-12-28 ENCOUNTER — Ambulatory Visit: Payer: 59

## 2021-01-03 ENCOUNTER — Ambulatory Visit (INDEPENDENT_AMBULATORY_CARE_PROVIDER_SITE_OTHER): Payer: 59 | Admitting: Vascular Surgery

## 2021-01-03 ENCOUNTER — Other Ambulatory Visit: Payer: Self-pay

## 2021-01-03 ENCOUNTER — Encounter (INDEPENDENT_AMBULATORY_CARE_PROVIDER_SITE_OTHER): Payer: Self-pay | Admitting: Vascular Surgery

## 2021-01-03 VITALS — BP 98/63 | HR 89 | Resp 16 | Ht 72.0 in | Wt 202.0 lb

## 2021-01-03 DIAGNOSIS — E1142 Type 2 diabetes mellitus with diabetic polyneuropathy: Secondary | ICD-10-CM

## 2021-01-03 DIAGNOSIS — E1169 Type 2 diabetes mellitus with other specified complication: Secondary | ICD-10-CM

## 2021-01-03 DIAGNOSIS — E785 Hyperlipidemia, unspecified: Secondary | ICD-10-CM

## 2021-01-03 DIAGNOSIS — Z89512 Acquired absence of left leg below knee: Secondary | ICD-10-CM

## 2021-01-03 DIAGNOSIS — I10 Essential (primary) hypertension: Secondary | ICD-10-CM

## 2021-01-03 DIAGNOSIS — E7800 Pure hypercholesterolemia, unspecified: Secondary | ICD-10-CM

## 2021-01-03 DIAGNOSIS — I70262 Atherosclerosis of native arteries of extremities with gangrene, left leg: Secondary | ICD-10-CM

## 2021-01-03 NOTE — Assessment & Plan Note (Signed)
Reasonably well, but did have some slough today.  I did some bedside debridement with scissors.  Change wet-to-dry dressing every day or 2.  Recheck in about 3 weeks.

## 2021-01-03 NOTE — Progress Notes (Signed)
MRN : [id number]  Danny Rock Dowis Sr. is a 70 y.o. (1951-04-13) male who presents with chief complaint of  Chief Complaint  Patient presents with  . Follow-up    4 wk no studies   .  History of Present Illness: Patient returns today in follow up of his left below-knee amputation.  He had gangrene of the left foot and this progressed despite revascularization.  He is doing reasonably well.  He is still having a lot of pain.  He has both neuropathic pain and wound pain.  He has some slough on the medial side of the wound.  No fevers or chills.  Current Outpatient Medications  Medication Sig Dispense Refill  . ACCU-CHEK GUIDE test strip CHECK CBGS 4 TIMES A DAY 300 strip 1  . albuterol (PROVENTIL) (2.5 MG/3ML) 0.083% nebulizer solution Take 3 mLs (2.5 mg total) by nebulization every 6 (six) hours as needed for wheezing or shortness of breath. 150 mL 1  . aspirin 81 MG tablet Take 81 mg by mouth daily.    [name] atorvastatin (LIPITOR) 80 MG tablet TAKE 1 TABLET BY MOUTH EVERY DAY 90 tablet 3  . baclofen (LIORESAL) 10 MG tablet TAKE 1 TABLET BY MOUTH THREE TIMES A DAY AS NEEDED FOR MUSCLE SPASMS 30 tablet 0  . blood glucose meter kit and supplies KIT Dispense based on patient and insurance preference. Check CBGs 4 times daily. Dx code E11.9. 1 each 0  . carvedilol (COREG) 12.5 MG tablet TAKE 1 TABLET (12.5 MG TOTAL) BY MOUTH 2 (TWO) TIMES DAILY WITH A MEAL. 180 tablet 3  . clopidogrel (PLAVIX) 75 MG tablet TAKE 1 TABLET BY MOUTH EVERY DAY 90 tablet 3  . Dulaglutide (TRULICITY) 4.5 MG/0.5ML SOPN Inject 4.5 mg as directed once a week. 6 mL 3  . escitalopram (LEXAPRO) 10 MG tablet TAKE 1 TABLET BY MOUTH EVERY DAY 90 tablet 1  . ezetimibe (ZETIA) 10 MG tablet TAKE 1 TABLET BY MOUTH EVERY DAY 90 tablet 3  . fluticasone-salmeterol (ADVAIR) 250-50 MCG/ACT AEPB Inhale 1 puff into the lungs 2 (two) times daily.    . furosemide (LASIX) 20 MG tablet TAKE 1 TABLET (20 MG) BY MOUTH ONCE EVERY OTHER DAY 45  tablet 3  . gabapentin (NEURONTIN) 300 MG capsule Take 1 capsule (300 mg total) by mouth at bedtime. 30 capsule 3  . insulin degludec (TRESIBA FLEXTOUCH) 100 UNIT/ML FlexTouch Pen Inject 25 Units into the skin daily. 45 mL 3  . Insulin Pen Needle (PEN NEEDLES) 32G X 6 MM MISC 1 each by Does not apply route 2 (two) times daily. 100 each 11  . isosorbide mononitrate (IMDUR) 30 MG 24 hr tablet TAKE 1 TABLET BY MOUTH EVERY DAY 90 tablet 3  . JARDIANCE 25 MG TABS tablet TAKE 1 TABLET BY MOUTH DAILY 90 tablet 3  . nitroGLYCERIN (NITROSTAT) 0.4 MG SL tablet Place 1 tablet (0.4 mg total) under the tongue every 5 (five) minutes as needed for chest pain. Maximum of 3 doses. 25 tablet 3  . oxyCODONE-acetaminophen (PERCOCET) 7.5-325 MG tablet Take 1 tablet by mouth every 8 (eight) hours as needed for severe pain. 12 tablet 0  . sacubitril-valsartan (ENTRESTO) 49-51 MG Take 1 tablet by mouth 2 (two) times daily. 60 tablet 0  . silver sulfADIAZINE (SILVADENE) 1 % cream Apply 1 application topically daily. 50 g 0  . spironolactone (ALDACTONE) 25 MG tablet Take 1 tablet (25 mg total) by mouth daily.    [name] sulfamethoxazole-trimethoprim (BACTRIM  DS) 800-160 MG tablet Take 1 tablet by mouth 2 (two) times daily. 20 tablet 0  . tamsulosin (FLOMAX) 0.4 MG CAPS capsule TAKE 1 CAPSULE BY MOUTH EVERY DAY 90 capsule 0  . VENTOLIN HFA 108 (90 Base) MCG/ACT inhaler Inhale 2 puffs into the lungs every 6 (six) hours as needed for wheezing or shortness of breath. 18 g 3   No current facility-administered medications for this visit.    Past Medical History:  Diagnosis Date  . CAD (coronary artery disease)    a. 01/2010 PCI of LAD; b. 09/2014 PCI/DES of mLAD due to ISR. D1 80, D2 80(jailed), LCX 52m; c. 07/2016 NSTEMI/Cath: LM nl, LAD 67m ISR, 40d, D1 90ost, 80p, D2 90ost, RI min irregs, LCX min irregs, OM1 90 small, OM2/3 min irregs, RCA min irregs, RPLB1 90, EF 25-35%.  . Chronic combined systolic and diastolic CHF (congestive  heart failure) ([place])    a. 07/2016 Echo: EF 30%, severe septal/anterior HK, Gr1 DD.  [name] CKD (chronic kidney disease), stage III ([place])   . COPD (chronic obstructive pulmonary disease) ([place])   . DM2 (diabetes mellitus, type 2) ([place])   . Erectile dysfunction   . HLD (hyperlipidemia)   . HTN (hypertension)   . Ischemic cardiomyopathy    a. 07/2016 Echo: EF 30% w/ sev septal/ant HK. Gr1 DD.    Past Surgical History:  Procedure Laterality Date  . AMPUTATION Left 11/10/2020   Procedure: AMPUTATION BELOW KNEE;  Surgeon: [name], MD;  Location: [facility];  Service: General;  Laterality: Left;  . BELOW KNEE LEG AMPUTATION    . CAD: stent to the LAD    . CARDIAC CATHETERIZATION  10/01/2014  . CARDIAC CATHETERIZATION N/A 07/23/2016   Procedure: Left Heart Cath and Coronary Angiography;  Surgeon: [name], MD;  Location: [facility];  Service: Cardiovascular;  Laterality: N/A;  . CORONARY ANGIOPLASTY WITH STENT PLACEMENT  10/01/2014  . LOWER EXTREMITY ANGIOGRAPHY Left 10/31/2020   Procedure: LOWER EXTREMITY ANGIOGRAPHY;  Surgeon: [name], MD;  Location: [facility];  Service: Cardiovascular;  Laterality: Left;     Social History   Tobacco Use  . Smoking status: Former Smoker    Packs/day: 3.00    Years: 0.00    Pack years: 0.00    Types: Cigarettes    Quit date: 09/07/2009    Years since quitting: 11.3  . Smokeless tobacco: Never Used  . Tobacco comment: quit june 2011  Vaping Use  . Vaping Use: Never used  Substance Use Topics  . Alcohol use: Yes    Comment: every other weekend  . Drug use: No      Family History  Problem Relation Age of Onset  . Heart attack Father        complications  . Cancer Brother     No Known Allergies    REVIEW OF SYSTEMS (Negative unless checked)  Constitutional: $RemoveBeforeDE'[]'yltYVHueqXDIaGI$ ?Weight loss  $Rem'[]'lzWI$ ?Fever  $Remov'[]'JhWbie$ ?Chills Cardiac: $RemoveBeforeDE'[]'vpkIfiSrIlfyilG$ ?Chest pain   '[]'$ ?Chest pressure   '[]'$ ?Palpitations   '[]'$ ?Shortness of breath when laying flat    '[x]'$ ?Shortness of breath at rest   '[x]'$ ?Shortness of breath with exertion. Vascular:  $RemoveBe'[]'cEwRJkDiY$ ?Pain in legs with walking   '[]'$ ?Pain in legs at rest   '[]'$ ?Pain in legs when laying flat   '[]'$ ?Claudication   '[]'$ ?Pain in feet when walking  $Remove'[]'QzefIIP$ ?Pain in feet at rest  $Rem'[]'pLrO$ ?Pain in feet when laying flat   '[]'$ ?History of DVT   '[]'$ ?Phlebitis   '[]'$ ?Swelling in  legs   [] ?Varicose veins   [x] ?Non-healing ulcers Pulmonary:   [] ?Uses home oxygen   [] ?Productive cough   [] ?Hemoptysis   [] ?Wheeze  [] ?COPD   [] ?Asthma Neurologic:  [] ?Dizziness  [] ?Blackouts   [] ?Seizures   [] ?History of stroke   [] ?History of TIA  [] ?Aphasia   [] ?Temporary blindness   [] ?Dysphagia   [] ?Weakness or numbness in arms   [] ?Weakness or numbness in legs Musculoskeletal:  [x] ?Arthritis   [] ?Joint swelling   [x] ?Joint pain   [] ?Low back pain Hematologic:  [] ?Easy bruising  [] ?Easy bleeding   [] ?Hypercoagulable state   [] ?Anemic  [] ?Hepatitis Gastrointestinal:  [] ?Blood in stool   [] ?Vomiting blood  [] ?Gastroesophageal reflux/heartburn   [] ?Abdominal pain Genitourinary:  [x] ?Chronic kidney disease   [] ?Difficult urination  [] ?Frequent urination  [] ?Burning with urination   [] ?Hematuria Skin:  [] ?Rashes   [x] ?Ulcers   [x] ?Wounds Psychological:  [] ?History of anxiety   [] ? History of major depression.   Physical Examination  BP 98/63 (BP Location: Right Arm)   Pulse 89   Resp 16   Ht 6' (1.829 m)   Wt 202 lb (91.6 kg)   BMI 27.40 kg/m  Gen:  WD/WN, NAD Dermatologic: No rashes or ulcers noted.  Small to medium amount of slough present on the medial side of the wound.  The midportion of the lateral side of the wound are healed.  No erythema.       Labs Recent Results (from the past 2160 hour(s))  B Nat Peptide     Status: None   Collection Time: 10/26/20 10:48 AM  Result Value Ref Range   B Natriuretic Peptide 94.0 0.0 - 100.0 pg/mL    Comment: Performed at [facility], [address] [place], [place] [address]  CBC     Status: None    Collection Time: 10/26/20 10:48 AM  Result Value Ref Range   WBC 10.5 4.0 - 10.5 K/uL   RBC 5.13 4.22 - 5.81 MIL/uL   Hemoglobin 15.4 13.0 - 17.0 g/dL   HCT 48.7 39.0 - 52.0 %   MCV 94.9 80.0 - 100.0 fL   MCH 30.0 26.0 - 34.0 pg   MCHC 31.6 30.0 - 36.0 g/dL   RDW 12.2 11.5 - 15.5 %   Platelets 342 150 - 400 K/uL   nRBC 0.0 0.0 - 0.2 %    Comment: Performed at [facility], [address] [place], [place] [address]  Hepatic function panel     Status: None   Collection Time: 10/26/20 10:48 AM  Result Value Ref Range   Total Protein 7.3 6.5 - 8.1 g/dL   Albumin 3.8 3.5 - 5.0 g/dL   AST 16 15 - 41 U/L   ALT 24 0 - 44 U/L   Alkaline Phosphatase 94 38 - 126 U/L   Total Bilirubin 0.6 0.3 - 1.2 mg/dL   Bilirubin, Direct <0.1 0.0 - 0.2 mg/dL   Indirect Bilirubin NOT CALCULATED 0.3 - 0.9 mg/dL    Comment: Performed at [facility], [address] [place], [place] [address]  Lipid Profile     Status: None   Collection Time: 10/26/20 10:48 AM  Result Value Ref Range   Cholesterol 96 0 - 200 mg/dL   Triglycerides 60 <150 mg/dL   HDL 46 >40 mg/dL   Total CHOL/HDL Ratio 2.1 RATIO   VLDL 12 0 - 40 mg/dL   LDL Cholesterol 38 0 - 99 mg/dL    Comment:  Total Cholesterol/HDL:CHD Risk Coronary Heart Disease Risk Table                     Men   Women  1/2 Average Risk   3.4   3.3  Average Risk       5.0   4.4  2 X Average Risk   9.6   7.1  3 X Average Risk  23.4   11.0        Use the calculated Patient Ratio above and the CHD Risk Table to determine the patient's CHD Risk.        ATP III CLASSIFICATION (LDL):  <100     mg/dL   Optimal  100-129  mg/dL   Near or Above                    Optimal  130-159  mg/dL   Borderline  160-189  mg/dL   High  >190     mg/dL   Very High Performed at [place], [place] [address]   SARS CORONAVIRUS 2 (TAT 6-24 HRS) Nasopharyngeal Nasopharyngeal Swab     Status: None   Collection  Time: 10/28/20 12:11 PM   Specimen: Nasopharyngeal Swab  Result Value Ref Range   SARS Coronavirus 2 NEGATIVE NEGATIVE    Comment: (NOTE) SARS-CoV-2 target nucleic acids are NOT DETECTED.  The SARS-CoV-2 RNA is generally detectable in upper and lower respiratory specimens during the acute phase of infection. Negative results do not preclude SARS-CoV-2 infection, do not rule out co-infections with other pathogens, and should not be used as the sole basis for treatment or other patient management decisions. Negative results must be combined with clinical observations, patient history, and epidemiological information. The expected result is Negative.  Fact Sheet for Patients: SugarRoll.be  Fact Sheet for Healthcare Providers: https://www.woods-mathews.com/  This test is not yet approved or cleared by the [place] FDA and  has been authorized for detection and/or diagnosis of SARS-CoV-2 by FDA under an Emergency Use Authorization (EUA). This EUA will remain  in effect (meaning this test can be used) for the duration of the COVID-19 declaration under Se ction 564(b)(1) of the Act, 21 U.S.C. section 360bbb-3(b)(1), unless the authorization is terminated or revoked sooner.  Performed at [facility], [place] [address], [place], [place] [address]   Glucose, capillary     Status: Abnormal   Collection Time: 10/31/20  1:05 PM  Result Value Ref Range   Glucose-Capillary 111 (H) 70 - 99 mg/dL    Comment: Glucose reference range applies only to samples taken after fasting for at least 8 hours.  Glucose, capillary     Status: Abnormal   Collection Time: 10/31/20  2:34 PM  Result Value Ref Range   Glucose-Capillary 111 (H) 70 - 99 mg/dL    Comment: Glucose reference range applies only to samples taken after fasting for at least 8 hours.  Culture, blood (routine x 2)     Status: None   Collection Time: 11/06/20  3:14 PM   Specimen: BLOOD   Result Value Ref Range   Specimen Description BLOOD  RT AC    Special Requests      BOTTLES DRAWN AEROBIC AND ANAEROBIC Blood Culture adequate volume   Culture      NO GROWTH 5 DAYS Performed at [facility], [address], [place], [place] [address]    Report Status 11/11/2020 FINAL   Comprehensive  metabolic panel     Status: Abnormal   Collection Time: 11/06/20  3:41 PM  Result Value Ref Range   Sodium 135 135 - 145 mmol/L   Potassium 3.9 3.5 - 5.1 mmol/L   Chloride 102 98 - 111 mmol/L   CO2 21 (L) 22 - 32 mmol/L   Glucose, Bld 212 (H) 70 - 99 mg/dL    Comment: Glucose reference range applies only to samples taken after fasting for at least 8 hours.   BUN 23 8 - 23 mg/dL   Creatinine, Ser 1.27 (H) 0.61 - 1.24 mg/dL   Calcium 8.6 (L) 8.9 - 10.3 mg/dL   Total Protein 7.0 6.5 - 8.1 g/dL   Albumin 3.0 (L) 3.5 - 5.0 g/dL   AST 74 (H) 15 - 41 U/L   ALT 99 (H) 0 - 44 U/L   Alkaline Phosphatase 129 (H) 38 - 126 U/L   Total Bilirubin 0.8 0.3 - 1.2 mg/dL   GFR, Estimated >60 >60 mL/min    Comment: (NOTE) Calculated using the CKD-EPI Creatinine Equation (2021)    Anion gap 12 5 - 15    Comment: Performed at [facility], [address] [place], [place] [address]  CBC with Differential     Status: Abnormal   Collection Time: 11/06/20  3:41 PM  Result Value Ref Range   WBC 24.4 (H) 4.0 - 10.5 K/uL   RBC 4.54 4.22 - 5.81 MIL/uL   Hemoglobin 13.5 13.0 - 17.0 g/dL   HCT 42.3 39.0 - 52.0 %   MCV 93.2 80.0 - 100.0 fL   MCH 29.7 26.0 - 34.0 pg   MCHC 31.9 30.0 - 36.0 g/dL   RDW 12.7 11.5 - 15.5 %   Platelets 407 (H) 150 - 400 K/uL   nRBC 0.0 0.0 - 0.2 %   Neutrophils Relative % 85 %   Neutro Abs 20.6 (H) 1.7 - 7.7 K/uL   Lymphocytes Relative 5 %   Lymphs Abs 1.3 0.7 - 4.0 K/uL   Monocytes Relative 9 %   Monocytes Absolute 2.1 (H) 0.1 - 1.0 K/uL   Eosinophils Relative 0 %   Eosinophils Absolute 0.1 0.0 - 0.5 K/uL   Basophils Relative 0 %   Basophils  Absolute 0.1 0.0 - 0.1 K/uL   Immature Granulocytes 1 %   Abs Immature Granulocytes 0.26 (H) 0.00 - 0.07 K/uL    Comment: Performed at [facility], [address] [place], [place] [address]  Lactic acid, plasma     Status: None   Collection Time: 11/06/20  3:41 PM  Result Value Ref Range   Lactic Acid, Venous 1.7 0.5 - 1.9 mmol/L    Comment: Performed at [facility], [address] [place], [place] [address]  Culture, blood (routine x 2)     Status: None   Collection Time: 11/06/20  3:41 PM   Specimen: BLOOD  Result Value Ref Range   Specimen Description BLOOD  RT FOREARM    Special Requests      BOTTLES DRAWN AEROBIC AND ANAEROBIC Blood Culture adequate volume   Culture      NO GROWTH 5 DAYS Performed at [facility], [address] [place], [place] [address]    Report Status 11/11/2020 FINAL   Lactic acid, plasma     Status: None   Collection Time: 11/06/20  8:07 PM  Result Value Ref Range   Lactic Acid, Venous 1.1 0.5 - 1.9 mmol/L    Comment:  Performed at [facility], [address] [place], [place] [address]  HIV Antibody (routine testing w rflx)     Status: None   Collection Time: 11/06/20  8:07 PM  Result Value Ref Range   HIV Screen 4th Generation wRfx Non Reactive Non Reactive    Comment: Performed at [facility], [address], [place], [place] [address]  SARS CORONAVIRUS 2 (TAT 6-24 HRS) Nasopharyngeal Nasopharyngeal Swab     Status: None   Collection Time: 11/06/20 11:35 PM   Specimen: Nasopharyngeal Swab  Result Value Ref Range   SARS Coronavirus 2 NEGATIVE NEGATIVE    Comment: (NOTE) SARS-CoV-2 target nucleic acids are NOT DETECTED.  The SARS-CoV-2 RNA is generally detectable in upper and lower respiratory specimens during the acute phase of infection. Negative results do not preclude SARS-CoV-2 infection, do not rule out co-infections with other pathogens, and should not be used as the sole basis for  treatment or other patient management decisions. Negative results must be combined with clinical observations, patient history, and epidemiological information. The expected result is Negative.  Fact Sheet for Patients: HairSlick.no  Fact Sheet for Healthcare Providers: quierodirigir.com  This test is not yet approved or cleared by the [place] FDA and  has been authorized for detection and/or diagnosis of SARS-CoV-2 by FDA under an Emergency Use Authorization (EUA). This EUA will remain  in effect (meaning this test can be used) for the duration of the COVID-19 declaration under Se ction 564(b)(1) of the Act, 21 U.S.C. section 360bbb-3(b)(1), unless the authorization is terminated or revoked sooner.  Performed at [facility], [address], [place], [place] [address]   Creatinine, serum     Status: None   Collection Time: 11/07/20  8:47 AM  Result Value Ref Range   Creatinine, Ser 1.13 0.61 - 1.24 mg/dL   GFR, Estimated >94 >63 mL/min    Comment: (NOTE) Calculated using the CKD-EPI Creatinine Equation (2021) Performed at [facility], [address] [place], [place] [address]   Glucose, capillary     Status: Abnormal   Collection Time: 11/07/20  8:57 AM  Result Value Ref Range   Glucose-Capillary 183 (H) 70 - 99 mg/dL    Comment: Glucose reference range applies only to samples taken after fasting for at least 8 hours.  Glucose, capillary     Status: Abnormal   Collection Time: 11/07/20 11:29 AM  Result Value Ref Range   Glucose-Capillary 231 (H) 70 - 99 mg/dL    Comment: Glucose reference range applies only to samples taken after fasting for at least 8 hours.  Glucose, capillary     Status: Abnormal   Collection Time: 11/07/20  3:55 PM  Result Value Ref Range   Glucose-Capillary 192 (H) 70 - 99 mg/dL    Comment: Glucose reference range applies only to samples taken after fasting for at least 8  hours.  Glucose, capillary     Status: Abnormal   Collection Time: 11/07/20  9:34 PM  Result Value Ref Range   Glucose-Capillary 139 (H) 70 - 99 mg/dL    Comment: Glucose reference range applies only to samples taken after fasting for at least 8 hours.  CBC     Status: Abnormal   Collection Time: 11/08/20  4:35 AM  Result Value Ref Range   WBC 21.6 (H) 4.0 - 10.5 K/uL   RBC 4.07 (L) 4.22 - 5.81 MIL/uL   Hemoglobin 12.3 (L) 13.0 - 17.0 g/dL  HCT 37.8 (L) 39.0 - 52.0 %   MCV 92.9 80.0 - 100.0 fL   MCH 30.2 26.0 - 34.0 pg   MCHC 32.5 30.0 - 36.0 g/dL   RDW 12.7 11.5 - 15.5 %   Platelets 378 150 - 400 K/uL   nRBC 0.0 0.0 - 0.2 %    Comment: Performed at [facility], [address], [place], [place] [address]  Basic metabolic panel     Status: Abnormal   Collection Time: 11/08/20  4:35 AM  Result Value Ref Range   Sodium 135 135 - 145 mmol/L   Potassium 3.7 3.5 - 5.1 mmol/L   Chloride 106 98 - 111 mmol/L   CO2 21 (L) 22 - 32 mmol/L   Glucose, Bld 103 (H) 70 - 99 mg/dL    Comment: Glucose reference range applies only to samples taken after fasting for at least 8 hours.   BUN 23 8 - 23 mg/dL   Creatinine, Ser 1.11 0.61 - 1.24 mg/dL   Calcium 8.3 (L) 8.9 - 10.3 mg/dL   GFR, Estimated >60 >60 mL/min    Comment: (NOTE) Calculated using the CKD-EPI Creatinine Equation (2021)    Anion gap 8 5 - 15    Comment: Performed at [facility], [address] [place], [place] [address]  ABO/Rh     Status: None   Collection Time: 11/08/20  4:35 AM  Result Value Ref Range   ABO/RH(D)      A POS Performed at [facility], [address] [place], [place] [address]   Glucose, capillary     Status: None   Collection Time: 11/08/20  7:40 AM  Result Value Ref Range   Glucose-Capillary 95 70 - 99 mg/dL    Comment: Glucose reference range applies only to samples taken after fasting for at least 8 hours.  Glucose, capillary     Status: Abnormal   Collection Time:  11/08/20 11:26 AM  Result Value Ref Range   Glucose-Capillary 133 (H) 70 - 99 mg/dL    Comment: Glucose reference range applies only to samples taken after fasting for at least 8 hours.  Glucose, capillary     Status: Abnormal   Collection Time: 11/08/20  4:50 PM  Result Value Ref Range   Glucose-Capillary 175 (H) 70 - 99 mg/dL    Comment: Glucose reference range applies only to samples taken after fasting for at least 8 hours.  ECHOCARDIOGRAM COMPLETE     Status: None   Collection Time: 11/08/20  9:17 PM  Result Value Ref Range   Weight 3,527.36 oz   Height 68 in   BP 160/101 mmHg   S' Lateral 4.23 cm   Area-P 1/2 4.15 cm2   Single Plane A4C EF 37.6 %   Single Plane A2C EF 40.3 %   Calc EF 36.6 %  Glucose, capillary     Status: Abnormal   Collection Time: 11/08/20  9:35 PM  Result Value Ref Range   Glucose-Capillary 157 (H) 70 - 99 mg/dL    Comment: Glucose reference range applies only to samples taken after fasting for at least 8 hours.  Comprehensive metabolic panel     Status: Abnormal   Collection Time: 11/09/20  5:09 AM  Result Value Ref Range   Sodium 136 135 - 145 mmol/L   Potassium 3.9 3.5 - 5.1 mmol/L   Chloride 107 98 - 111 mmol/L   CO2 22 22 - 32 mmol/L   Glucose, Bld 114 (  H) 70 - 99 mg/dL    Comment: Glucose reference range applies only to samples taken after fasting for at least 8 hours.   BUN 26 (H) 8 - 23 mg/dL   Creatinine, Ser 1.13 0.61 - 1.24 mg/dL   Calcium 8.5 (L) 8.9 - 10.3 mg/dL   Total Protein 6.5 6.5 - 8.1 g/dL   Albumin 2.4 (L) 3.5 - 5.0 g/dL   AST 46 (H) 15 - 41 U/L   ALT 60 (H) 0 - 44 U/L   Alkaline Phosphatase 120 38 - 126 U/L   Total Bilirubin 0.8 0.3 - 1.2 mg/dL   GFR, Estimated >60 >60 mL/min    Comment: (NOTE) Calculated using the CKD-EPI Creatinine Equation (2021)    Anion gap 7 5 - 15    Comment: Performed at [facility], [address] [place], [place] [address]  Glucose, capillary     Status: Abnormal   Collection  Time: 11/09/20  7:54 AM  Result Value Ref Range   Glucose-Capillary 116 (H) 70 - 99 mg/dL    Comment: Glucose reference range applies only to samples taken after fasting for at least 8 hours.  Type and screen     Status: None   Collection Time: 11/09/20 10:28 AM  Result Value Ref Range   ABO/RH(D) A POS    Antibody Screen NEG    Sample Expiration 11/12/2020,2359    Antibody Identification ANTI N    PT AG Type      POSITIVE FOR A1 ANTIGEN Performed at [facility], [address] [place], [place] [address]   Glucose, capillary     Status: Abnormal   Collection Time: 11/09/20 11:35 AM  Result Value Ref Range   Glucose-Capillary 196 (H) 70 - 99 mg/dL    Comment: Glucose reference range applies only to samples taken after fasting for at least 8 hours.  Glucose, capillary     Status: Abnormal   Collection Time: 11/09/20  3:48 PM  Result Value Ref Range   Glucose-Capillary 147 (H) 70 - 99 mg/dL    Comment: Glucose reference range applies only to samples taken after fasting for at least 8 hours.  Glucose, capillary     Status: Abnormal   Collection Time: 11/09/20  8:36 PM  Result Value Ref Range   Glucose-Capillary 200 (H) 70 - 99 mg/dL    Comment: Glucose reference range applies only to samples taken after fasting for at least 8 hours.  CBC     Status: Abnormal   Collection Time: 11/10/20  5:07 AM  Result Value Ref Range   WBC 19.1 (H) 4.0 - 10.5 K/uL   RBC 4.30 4.22 - 5.81 MIL/uL   Hemoglobin 12.6 (L) 13.0 - 17.0 g/dL   HCT 40.1 39.0 - 52.0 %   MCV 93.3 80.0 - 100.0 fL   MCH 29.3 26.0 - 34.0 pg   MCHC 31.4 30.0 - 36.0 g/dL   RDW 13.0 11.5 - 15.5 %   Platelets 479 (H) 150 - 400 K/uL   nRBC 0.0 0.0 - 0.2 %    Comment: Performed at [facility], [address], [place], [place] [address]  Basic metabolic panel     Status: Abnormal   Collection Time: 11/10/20  5:07 AM  Result Value Ref Range   Sodium 137 135 - 145 mmol/L   Potassium 4.3 3.5 - 5.1 mmol/L    Chloride 107 98 - 111 mmol/L   CO2 23 22 - 32 mmol/L  Glucose, Bld 133 (H) 70 - 99 mg/dL    Comment: Glucose reference range applies only to samples taken after fasting for at least 8 hours.   BUN 29 (H) 8 - 23 mg/dL   Creatinine, Ser 1.32 (H) 0.61 - 1.24 mg/dL   Calcium 8.5 (L) 8.9 - 10.3 mg/dL   GFR, Estimated 58 (L) >60 mL/min    Comment: (NOTE) Calculated using the CKD-EPI Creatinine Equation (2021)    Anion gap 7 5 - 15    Comment: Performed at [facility], [address] [place], [place] [address]  Magnesium     Status: None   Collection Time: 11/10/20  5:07 AM  Result Value Ref Range   Magnesium 2.1 1.7 - 2.4 mg/dL    Comment: Performed at [facility], [address] [place], [place] [address]  Protime-INR     Status: Abnormal   Collection Time: 11/10/20  5:07 AM  Result Value Ref Range   Prothrombin Time 15.8 (H) 11.4 - 15.2 seconds   INR 1.3 (H) 0.8 - 1.2    Comment: (NOTE) INR goal varies based on device and disease states. Performed at [facility], [address] [place], [place] [address]   APTT     Status: Abnormal   Collection Time: 11/10/20  5:07 AM  Result Value Ref Range   aPTT 42 (H) 24 - 36 seconds    Comment:        IF BASELINE aPTT IS ELEVATED, SUGGEST PATIENT RISK ASSESSMENT BE USED TO DETERMINE APPROPRIATE ANTICOAGULANT THERAPY. Performed at [facility], [address] [place], [place] [address]   Glucose, capillary     Status: Abnormal   Collection Time: 11/10/20  5:14 AM  Result Value Ref Range   Glucose-Capillary 138 (H) 70 - 99 mg/dL    Comment: Glucose reference range applies only to samples taken after fasting for at least 8 hours.  Glucose, capillary     Status: Abnormal   Collection Time: 11/10/20  8:33 AM  Result Value Ref Range   Glucose-Capillary 129 (H) 70 - 99 mg/dL    Comment: Glucose reference range applies only to samples taken after fasting for at least 8 hours.  Glucose,  capillary     Status: Abnormal   Collection Time: 11/10/20 12:13 PM  Result Value Ref Range   Glucose-Capillary 110 (H) 70 - 99 mg/dL    Comment: Glucose reference range applies only to samples taken after fasting for at least 8 hours.  Surgical pathology     Status: None   Collection Time: 11/10/20  3:43 PM  Result Value Ref Range   SURGICAL PATHOLOGY      SURGICAL PATHOLOGY CASE: [id number] PATIENT: [facility]     Specimen Submitted: A. Leg, left lower below the knee  Clinical History: Left foot gangrene    DIAGNOSIS: A. LEG, LEFT; BELOW THE KNEE AMPUTATION: - ULCERATION AND GANGRENOUS NECROSIS. - NO DEFINITE EVIDENCE OF ACUTE OSTEOMYELITIS. - SEVERE CALCIFIC ATHEROSCLEROTIC VASCULAR DISEASE. - VIABLE SKIN, SOFT TISSUE, AND BONY MARGINS, WITHOUT ACUTE INFLAMMATION.  GROSS DESCRIPTION: A. Labeled: Left leg below-knee amputation Received: Fresh Collection time: 3:43 PM on 11/10/2020 Placed into formalin time: Not applicable Size: 33.6 x 25.5 x 10 cm Description of lesion(s): Received is a left below the knee amputation specimen.  There are 5 toes each with a toenail.  The great toe has black-brown and firm sloughing skin, the second toe has black-purple focally sloughig skin,  the third and fourth toes have red to purple softened skin.  Extending from the great toe onto the medi al plantar aspect is a 12.2 x 9 cm area of black-brown skin discoloration.  The remaining skin is tan, smooth, and focally hairbearing. Proximal margin: The proximal margin is grossly viable.  The margin displays tan smooth hairbearing skin, tan soft tissue, red-brown muscle, and tan firm bone. Bone: The bone underlying the areas of skin discoloration is firm and discolored black-brown. Other findings: The anterior and posterior tibial artery display moderate to severe atherosclerosis with scattered areas of calcification.  Block summary: 1 - tibia and fibula  resection margin reamings, following decalcification 2 - representative skin and soft tissue resection margins, en face 3 - representative great toe and medial plantar foot skin discoloration with underlying bone, following decalcification 4 - representative skin discolorations from second, third, and fourth toes 5 - representative anterior tibial artery cross-sections (proximal and distal), following decalcif ication 6 - representative posterior tibial artery cross-sections (proximal and distal), following decalcification  Tissue decalcification: Yes, cassettes 1, 3, 5, and 6  RB 11/11/2020  Final Diagnosis performed by Allena Napoleon, MD.   Electronically signed 11/14/2020 9:01:32AM The electronic signature indicates that the named Attending Pathologist has evaluated the specimen Technical component performed at [facility], [address] [place], [place] [address] Lab: [phone] Dir: Rush Farmer, MD, MMM  Professional component performed at [facility], [facility], [address], [place], [place] [address] Lab: [phone] Dir: Dellia Nims. Rubinas, MD   Glucose, capillary     Status: Abnormal   Collection Time: 11/10/20  4:32 PM  Result Value Ref Range   Glucose-Capillary 109 (H) 70 - 99 mg/dL    Comment: Glucose reference range applies only to samples taken after fasting for at least 8 hours.  Glucose, capillary     Status: Abnormal   Collection Time: 11/10/20  6:05 PM  Result Value Ref Range   Glucose-Capillary 121 (H) 70 - 99 mg/dL    Comment: Glucose reference range applies only to samples taken after fasting for at least 8 hours.  Glucose, capillary     Status: Abnormal   Collection Time: 11/10/20  8:38 PM  Result Value Ref Range   Glucose-Capillary 161 (H) 70 - 99 mg/dL    Comment: Glucose reference range applies only to samples taken after fasting for at least 8 hours.  CBC with Differential/Platelet     Status: Abnormal   Collection Time: 11/11/20  6:40  AM  Result Value Ref Range   WBC 18.0 (H) 4.0 - 10.5 K/uL   RBC 4.07 (L) 4.22 - 5.81 MIL/uL   Hemoglobin 12.2 (L) 13.0 - 17.0 g/dL   HCT 37.7 (L) 39.0 - 52.0 %   MCV 92.6 80.0 - 100.0 fL   MCH 30.0 26.0 - 34.0 pg   MCHC 32.4 30.0 - 36.0 g/dL   RDW 13.1 11.5 - 15.5 %   Platelets 519 (H) 150 - 400 K/uL   nRBC 0.0 0.0 - 0.2 %   Neutrophils Relative % 79 %   Neutro Abs 14.3 (H) 1.7 - 7.7 K/uL   Lymphocytes Relative 7 %   Lymphs Abs 1.3 0.7 - 4.0 K/uL   Monocytes Relative 9 %   Monocytes Absolute 1.7 (H) 0.1 - 1.0 K/uL   Eosinophils Relative 3 %   Eosinophils Absolute 0.5 0.0 - 0.5 K/uL   Basophils Relative 0 %   Basophils Absolute 0.1 0.0 - 0.1 K/uL  Immature Granulocytes 2 %   Abs Immature Granulocytes 0.33 (H) 0.00 - 0.07 K/uL    Comment: Performed at [facility], [address] [place], [place] [address]  Basic metabolic panel     Status: Abnormal   Collection Time: 11/11/20  6:40 AM  Result Value Ref Range   Sodium 137 135 - 145 mmol/L   Potassium 3.9 3.5 - 5.1 mmol/L   Chloride 108 98 - 111 mmol/L   CO2 21 (L) 22 - 32 mmol/L   Glucose, Bld 148 (H) 70 - 99 mg/dL    Comment: Glucose reference range applies only to samples taken after fasting for at least 8 hours.   BUN 22 8 - 23 mg/dL   Creatinine, Ser 1.14 0.61 - 1.24 mg/dL   Calcium 8.2 (L) 8.9 - 10.3 mg/dL   GFR, Estimated >60 >60 mL/min    Comment: (NOTE) Calculated using the CKD-EPI Creatinine Equation (2021)    Anion gap 8 5 - 15    Comment: Performed at [facility], [address] [place], [place] [address]  Magnesium     Status: None   Collection Time: 11/11/20  6:40 AM  Result Value Ref Range   Magnesium 1.9 1.7 - 2.4 mg/dL    Comment: Performed at [facility], [address] [place], [place] [address]  Glucose, capillary     Status: Abnormal   Collection Time: 11/11/20  8:31 AM  Result Value Ref Range   Glucose-Capillary 168 (H) 70 - 99 mg/dL    Comment: Glucose  reference range applies only to samples taken after fasting for at least 8 hours.  Glucose, capillary     Status: Abnormal   Collection Time: 11/11/20 11:38 AM  Result Value Ref Range   Glucose-Capillary 123 (H) 70 - 99 mg/dL    Comment: Glucose reference range applies only to samples taken after fasting for at least 8 hours.  Glucose, capillary     Status: Abnormal   Collection Time: 11/11/20  4:29 PM  Result Value Ref Range   Glucose-Capillary 160 (H) 70 - 99 mg/dL    Comment: Glucose reference range applies only to samples taken after fasting for at least 8 hours.  Glucose, capillary     Status: Abnormal   Collection Time: 11/11/20  9:07 PM  Result Value Ref Range   Glucose-Capillary 131 (H) 70 - 99 mg/dL    Comment: Glucose reference range applies only to samples taken after fasting for at least 8 hours.  CBC with Differential/Platelet     Status: Abnormal   Collection Time: 11/12/20  4:51 AM  Result Value Ref Range   WBC 13.8 (H) 4.0 - 10.5 K/uL   RBC 3.61 (L) 4.22 - 5.81 MIL/uL   Hemoglobin 11.1 (L) 13.0 - 17.0 g/dL   HCT 33.8 (L) 39.0 - 52.0 %   MCV 93.6 80.0 - 100.0 fL   MCH 30.7 26.0 - 34.0 pg   MCHC 32.8 30.0 - 36.0 g/dL   RDW 13.1 11.5 - 15.5 %   Platelets 486 (H) 150 - 400 K/uL   nRBC 0.0 0.0 - 0.2 %   Neutrophils Relative % 71 %   Neutro Abs 9.8 (H) 1.7 - 7.7 K/uL   Lymphocytes Relative 10 %   Lymphs Abs 1.4 0.7 - 4.0 K/uL   Monocytes Relative 12 %   Monocytes Absolute 1.7 (H) 0.1 - 1.0 K/uL   Eosinophils Relative 4 %   Eosinophils Absolute 0.5 0.0 -  0.5 K/uL   Basophils Relative 0 %   Basophils Absolute 0.1 0.0 - 0.1 K/uL   Immature Granulocytes 3 %   Abs Immature Granulocytes 0.40 (H) 0.00 - 0.07 K/uL    Comment: Performed at [facility], [address] [place], [place] [address]  Basic metabolic panel     Status: Abnormal   Collection Time: 11/12/20  4:51 AM  Result Value Ref Range   Sodium 137 135 - 145 mmol/L   Potassium 3.8 3.5 - 5.1 mmol/L    Chloride 107 98 - 111 mmol/L   CO2 23 22 - 32 mmol/L   Glucose, Bld 127 (H) 70 - 99 mg/dL    Comment: Glucose reference range applies only to samples taken after fasting for at least 8 hours.   BUN 20 8 - 23 mg/dL   Creatinine, Ser 0.92 0.61 - 1.24 mg/dL   Calcium 8.1 (L) 8.9 - 10.3 mg/dL   GFR, Estimated >60 >60 mL/min    Comment: (NOTE) Calculated using the CKD-EPI Creatinine Equation (2021)    Anion gap 7 5 - 15    Comment: Performed at [facility], [address] [place], [place] [address]  Glucose, capillary     Status: None   Collection Time: 11/12/20  8:35 AM  Result Value Ref Range   Glucose-Capillary 95 70 - 99 mg/dL    Comment: Glucose reference range applies only to samples taken after fasting for at least 8 hours.  Glucose, capillary     Status: Abnormal   Collection Time: 11/12/20 12:19 PM  Result Value Ref Range   Glucose-Capillary 222 (H) 70 - 99 mg/dL    Comment: Glucose reference range applies only to samples taken after fasting for at least 8 hours.  Glucose, capillary     Status: Abnormal   Collection Time: 11/12/20  3:45 PM  Result Value Ref Range   Glucose-Capillary 171 (H) 70 - 99 mg/dL    Comment: Glucose reference range applies only to samples taken after fasting for at least 8 hours.  Glucose, capillary     Status: Abnormal   Collection Time: 11/12/20  7:56 PM  Result Value Ref Range   Glucose-Capillary 151 (H) 70 - 99 mg/dL    Comment: Glucose reference range applies only to samples taken after fasting for at least 8 hours.  Basic metabolic panel     Status: Abnormal   Collection Time: 11/13/20  4:47 AM  Result Value Ref Range   Sodium 140 135 - 145 mmol/L   Potassium 3.7 3.5 - 5.1 mmol/L   Chloride 109 98 - 111 mmol/L   CO2 25 22 - 32 mmol/L   Glucose, Bld 134 (H) 70 - 99 mg/dL    Comment: Glucose reference range applies only to samples taken after fasting for at least 8 hours.   BUN 22 8 - 23 mg/dL   Creatinine, Ser 0.98 0.61 -  1.24 mg/dL   Calcium 8.3 (L) 8.9 - 10.3 mg/dL   GFR, Estimated >60 >60 mL/min    Comment: (NOTE) Calculated using the CKD-EPI Creatinine Equation (2021)    Anion gap 6 5 - 15    Comment: Performed at [facility], [address] [place], [place] [address]  Glucose, capillary     Status: Abnormal   Collection Time: 11/13/20  8:10 AM  Result Value Ref Range   Glucose-Capillary 230 (H) 70 - 99 mg/dL    Comment: Glucose reference range applies only to samples  taken after fasting for at least 8 hours.  Glucose, capillary     Status: Abnormal   Collection Time: 11/13/20 11:43 AM  Result Value Ref Range   Glucose-Capillary 243 (H) 70 - 99 mg/dL    Comment: Glucose reference range applies only to samples taken after fasting for at least 8 hours.  Glucose, capillary     Status: Abnormal   Collection Time: 11/13/20  4:35 PM  Result Value Ref Range   Glucose-Capillary 234 (H) 70 - 99 mg/dL    Comment: Glucose reference range applies only to samples taken after fasting for at least 8 hours.  Glucose, capillary     Status: Abnormal   Collection Time: 11/13/20  5:57 PM  Result Value Ref Range   Glucose-Capillary 299 (H) 70 - 99 mg/dL    Comment: Glucose reference range applies only to samples taken after fasting for at least 8 hours.  Glucose, capillary     Status: Abnormal   Collection Time: 11/13/20  8:39 PM  Result Value Ref Range   Glucose-Capillary 171 (H) 70 - 99 mg/dL    Comment: Glucose reference range applies only to samples taken after fasting for at least 8 hours.  Glucose, capillary     Status: Abnormal   Collection Time: 11/14/20  7:34 AM  Result Value Ref Range   Glucose-Capillary 175 (H) 70 - 99 mg/dL    Comment: Glucose reference range applies only to samples taken after fasting for at least 8 hours.  Glucose, capillary     Status: Abnormal   Collection Time: 11/14/20 12:08 PM  Result Value Ref Range   Glucose-Capillary 253 (H) 70 - 99 mg/dL    Comment: Glucose  reference range applies only to samples taken after fasting for at least 8 hours.  Glucose, capillary     Status: Abnormal   Collection Time: 11/14/20  9:25 PM  Result Value Ref Range   Glucose-Capillary 162 (H) 70 - 99 mg/dL    Comment: Glucose reference range applies only to samples taken after fasting for at least 8 hours.  Glucose, capillary     Status: Abnormal   Collection Time: 11/15/20  7:42 AM  Result Value Ref Range   Glucose-Capillary 178 (H) 70 - 99 mg/dL    Comment: Glucose reference range applies only to samples taken after fasting for at least 8 hours.  Resp Panel by RT-PCR (Flu A&B, Covid) Nasopharyngeal Swab     Status: None   Collection Time: 11/15/20  8:15 AM   Specimen: Nasopharyngeal Swab; Nasopharyngeal(NP) swabs in vial transport medium  Result Value Ref Range   SARS Coronavirus 2 by RT PCR NEGATIVE NEGATIVE    Comment: (NOTE) SARS-CoV-2 target nucleic acids are NOT DETECTED.  The SARS-CoV-2 RNA is generally detectable in upper respiratory specimens during the acute phase of infection. The lowest concentration of SARS-CoV-2 viral copies this assay can detect is 138 copies/mL. A negative result does not preclude SARS-Cov-2 infection and should not be used as the sole basis for treatment or other patient management decisions. A negative result may occur with  improper specimen collection/handling, submission of specimen other than nasopharyngeal swab, presence of viral mutation(s) within the areas targeted by this assay, and inadequate number of viral copies(<138 copies/mL). A negative result must be combined with clinical observations, patient history, and epidemiological information. The expected result is Negative.  Fact Sheet for Patients:  EntrepreneurPulse.com.au  Fact Sheet for Healthcare Providers:  IncredibleEmployment.be  This test is no t yet  approved or cleared by the [place] and  has been authorized  for detection and/or diagnosis of SARS-CoV-2 by FDA under an Emergency Use Authorization (EUA). This EUA will remain  in effect (meaning this test can be used) for the duration of the COVID-19 declaration under Section 564(b)(1) of the Act, 21 U.S.C.section 360bbb-3(b)(1), unless the authorization is terminated  or revoked sooner.       Influenza A by PCR NEGATIVE NEGATIVE   Influenza B by PCR NEGATIVE NEGATIVE    Comment: (NOTE) The Xpert Xpress SARS-CoV-2/FLU/RSV plus assay is intended as an aid in the diagnosis of influenza from Nasopharyngeal swab specimens and should not be used as a sole basis for treatment. Nasal washings and aspirates are unacceptable for Xpert Xpress SARS-CoV-2/FLU/RSV testing.  Fact Sheet for Patients: EntrepreneurPulse.com.au  Fact Sheet for Healthcare Providers: IncredibleEmployment.be  This test is not yet approved or cleared by the [place] FDA and has been authorized for detection and/or diagnosis of SARS-CoV-2 by FDA under an Emergency Use Authorization (EUA). This EUA will remain in effect (meaning this test can be used) for the duration of the COVID-19 declaration under Section 564(b)(1) of the Act, 21 U.S.C. section 360bbb-3(b)(1), unless the authorization is terminated or revoked.  Performed at [facility], [address] [place], [place] [address]   Glucose, capillary     Status: Abnormal   Collection Time: 11/15/20 11:48 AM  Result Value Ref Range   Glucose-Capillary 231 (H) 70 - 99 mg/dL    Comment: Glucose reference range applies only to samples taken after fasting for at least 8 hours.  Glucose, capillary     Status: Abnormal   Collection Time: 11/15/20  3:42 PM  Result Value Ref Range   Glucose-Capillary 140 (H) 70 - 99 mg/dL    Comment: Glucose reference range applies only to samples taken after fasting for at least 8 hours.  Urinalysis, Complete w Microscopic Urine, Clean  Catch     Status: Abnormal   Collection Time: 11/15/20  4:05 PM  Result Value Ref Range   Color, Urine YELLOW (A) YELLOW   APPearance CLEAR (A) CLEAR   Specific Gravity, Urine 1.016 1.005 - 1.030   pH 5.0 5.0 - 8.0   Glucose, UA 50 (A) NEGATIVE mg/dL   Hgb urine dipstick SMALL (A) NEGATIVE   Bilirubin Urine NEGATIVE NEGATIVE   Ketones, ur NEGATIVE NEGATIVE mg/dL   Protein, ur NEGATIVE NEGATIVE mg/dL   Nitrite NEGATIVE NEGATIVE   Leukocytes,Ua NEGATIVE NEGATIVE   RBC / HPF 0-5 0 - 5 RBC/hpf   WBC, UA 0-5 0 - 5 WBC/hpf   Bacteria, UA NONE SEEN NONE SEEN   Squamous Epithelial / LPF 0-5 0 - 5    Comment: Performed at [facility], [address] [place], [place] [address]  Glucose, capillary     Status: Abnormal   Collection Time: 11/15/20  8:46 PM  Result Value Ref Range   Glucose-Capillary 253 (H) 70 - 99 mg/dL    Comment: Glucose reference range applies only to samples taken after fasting for at least 8 hours.  Glucose, capillary     Status: Abnormal   Collection Time: 11/16/20  7:38 AM  Result Value Ref Range   Glucose-Capillary 130 (H) 70 - 99 mg/dL    Comment: Glucose reference range applies only to samples taken after fasting for at least 8 hours.  Glucose, capillary     Status: Abnormal   Collection Time: 11/16/20 11:47 AM  Result Value Ref Range   Glucose-Capillary 234 (H) 70 - 99 mg/dL    Comment: Glucose reference range applies only to samples taken after fasting for at least 8 hours.  CBC with Differential     Status: Abnormal   Collection Time: 11/28/20  1:25 PM  Result Value Ref Range   WBC 10.3 4.0 - 10.5 K/uL   RBC 4.54 4.22 - 5.81 MIL/uL   Hemoglobin 13.3 13.0 - 17.0 g/dL   HCT 42.0 39.0 - 52.0 %   MCV 92.5 80.0 - 100.0 fL   MCH 29.3 26.0 - 34.0 pg   MCHC 31.7 30.0 - 36.0 g/dL   RDW 14.1 11.5 - 15.5 %   Platelets 397 150 - 400 K/uL   nRBC 0.0 0.0 - 0.2 %   Neutrophils Relative % 57 %   Neutro Abs 6.0 1.7 - 7.7 K/uL   Lymphocytes Relative 20 %    Lymphs Abs 2.1 0.7 - 4.0 K/uL   Monocytes Relative 9 %   Monocytes Absolute 0.9 0.1 - 1.0 K/uL   Eosinophils Relative 11 %   Eosinophils Absolute 1.1 (H) 0.0 - 0.5 K/uL   Basophils Relative 1 %   Basophils Absolute 0.1 0.0 - 0.1 K/uL   Immature Granulocytes 2 %   Abs Immature Granulocytes 0.16 (H) 0.00 - 0.07 K/uL    Comment: Performed at [facility], [address] [place], [place] [address]  ECHOCARDIOGRAM COMPLETE     Status: None   Collection Time: 12/06/20 11:17 AM  Result Value Ref Range   BP 113/77 mmHg   AR max vel 3.45 cm2   AV Peak grad 3.3 mmHg   Ao pk vel 0.91 m/s   Area-P 1/2 2.60 cm2   AV Area VTI 3.24 cm2   AV Mean grad 2.0 mmHg   AV Area mean vel 2.93 cm2  CBC     Status: None   Collection Time: 12/12/20  1:39 PM  Result Value Ref Range   WBC 9.9 4.0 - 10.5 K/uL   RBC 4.47 4.22 - 5.81 MIL/uL   Hemoglobin 13.4 13.0 - 17.0 g/dL   HCT 42.4 39.0 - 52.0 %   MCV 94.9 80.0 - 100.0 fL   MCH 30.0 26.0 - 34.0 pg   MCHC 31.6 30.0 - 36.0 g/dL   RDW 15.3 11.5 - 15.5 %   Platelets 239 150 - 400 K/uL   nRBC 0.0 0.0 - 0.2 %    Comment: Performed at [facility], [address] [place], [place] [address]  Comprehensive metabolic panel     Status: Abnormal   Collection Time: 12/12/20  1:39 PM  Result Value Ref Range   Sodium 138 135 - 145 mmol/L   Potassium 4.1 3.5 - 5.1 mmol/L   Chloride 110 98 - 111 mmol/L   CO2 22 22 - 32 mmol/L   Glucose, Bld 141 (H) 70 - 99 mg/dL    Comment: Glucose reference range applies only to samples taken after fasting for at least 8 hours.   BUN 33 (H) 8 - 23 mg/dL   Creatinine, Ser 1.24 0.61 - 1.24 mg/dL   Calcium 8.3 (L) 8.9 - 10.3 mg/dL   Total Protein 6.0 (L) 6.5 - 8.1 g/dL   Albumin 3.0 (L) 3.5 - 5.0 g/dL   AST 18 15 - 41 U/L   ALT 24 0 - 44 U/L   Alkaline Phosphatase 92 38 - 126 U/L   Total Bilirubin 0.5 0.3 -  1.2 mg/dL   GFR, Estimated >60 >60 mL/min    Comment: (NOTE) Calculated using the CKD-EPI  Creatinine Equation (2021)    Anion gap 6 5 - 15    Comment: Performed at [facility], [place], [place] [address]  Troponin I (High Sensitivity)     Status: None   Collection Time: 12/12/20  1:39 PM  Result Value Ref Range   Troponin I (High Sensitivity) 9 <18 ng/L    Comment: (NOTE) Elevated high sensitivity troponin I (hsTnI) values and significant  changes across serial measurements may suggest ACS but many other  chronic and acute conditions are known to elevate hsTnI results.  Refer to the "Links" section for chest pain algorithms and additional  guidance. Performed at [facility], [address] [place], [place] [address]     Radiology ECHOCARDIOGRAM COMPLETE  Result Date: 12/07/2020    ECHOCARDIOGRAM REPORT   Patient Name:   Sr. Reford Olliff Rainford Sr. Date of Exam: 12/06/2020 Medical Rec #:  [id number]              Height:       68.0 in Accession #:    [id number]             Weight:       222.4 lb Date of Birth:  02-22-1951              BSA:          2.138 m Patient Age:    86 years               BP:           138/72 mmHg Patient Gender: M                      HR:           86 bpm. Exam Location:  [facility] Procedure: 2D Echo, Cardiac Doppler, Color Doppler and Intracardiac            Opacification Agent Indications:    R06.02 SOB; R07.9* Chest pain, unspecified  History:        Patient has prior history of Echocardiogram examinations, most                 recent 11/08/2020. CHF, COPD and PAD, Signs/Symptoms:Shortness of                 Breath and Chest Pain; Risk Factors:Hypertension, Dyslipidemia,                 Former Smoker and Diabetes.  Sonographer:    Pilar Jarvis RDMS, RVT, RDCS Referring Phys: [id number] Arvil Chaco  Sonographer Comments: Technically difficult study due to poor echo windows and suboptimal parasternal window. IMPRESSIONS  1. Left ventricular ejection fraction, by estimation, is 35 to 40%. The left ventricle has moderately  decreased function. The left ventricle demonstrates global hypokinesis. Left ventricular diastolic parameters are indeterminate.  2. Right ventricular systolic function is normal. The right ventricular size is normal.  3. The mitral valve was not well visualized. No evidence of mitral valve regurgitation.  4. The aortic valve was not well visualized. Aortic valve regurgitation is not visualized. No aortic stenosis is present.  5. The inferior vena cava is normal in size with greater than 50% respiratory variability, suggesting right atrial pressure of 3 mmHg. FINDINGS  Left Ventricle: Left ventricular size and wall thickness are not well assessed. Left ventricular  ejection fraction, by estimation, is 35 to 40%. The left ventricle has moderately decreased function. The left ventricle demonstrates global hypokinesis. Left ventricular diastolic parameters are indeterminate. Right Ventricle: The right ventricular size is normal. No increase in right ventricular wall thickness. Right ventricular systolic function is normal. Left Atrium: Left atrial size was normal in size. Right Atrium: Right atrial size was normal in size. Pericardium: There is no evidence of pericardial effusion. Mitral Valve: The mitral valve was not well visualized. No evidence of mitral valve regurgitation. Tricuspid Valve: The tricuspid valve is not well visualized. Tricuspid valve regurgitation is not demonstrated. Aortic Valve: The aortic valve was not well visualized. Aortic valve regurgitation is not visualized. No aortic stenosis is present. Aortic valve mean gradient measures 2.0 mmHg. Aortic valve peak gradient measures 3.3 mmHg. Aortic valve area, by VTI measures 3.24 cm. Pulmonic Valve: The pulmonic valve was not well visualized. Aorta: The aortic root was not well visualized. Venous: The inferior vena cava is normal in size with greater than 50% respiratory variability, suggesting right atrial pressure of 3 mmHg. IAS/Shunts: The  interatrial septum was not well visualized.  LEFT VENTRICLE PLAX 2D LVOT diam:     2.20 cm  Diastology LV SV:         50       LV e' medial:    4.68 cm/s LV SV Index:   23       LV E/e' medial:  7.3 LVOT Area:     3.80 cm LV e' lateral:   4.24 cm/s                         LV E/e' lateral: 8.1  RIGHT VENTRICLE             IVC RV Basal diam:  3.50 cm     IVC diam: 1.30 cm RV S prime:     12.10 cm/s TAPSE (M-mode): 2.5 cm LEFT ATRIUM             Index       RIGHT ATRIUM           Index LA diam:        3.80 cm 1.78 cm/m  RA Area:     11.70 cm LA Vol (A2C):   66.9 ml 31.29 ml/m RA Volume:   29.30 ml  13.70 ml/m LA Vol (A4C):   29.8 ml 13.94 ml/m LA Biplane Vol: 47.4 ml 22.17 ml/m  AORTIC VALVE AV Area (Vmax):    3.45 cm AV Area (Vmean):   2.93 cm AV Area (VTI):     3.24 cm AV Vmax:           90.50 cm/s AV Vmean:          66.200 cm/s AV VTI:            0.155 m AV Peak Grad:      3.3 mmHg AV Mean Grad:      2.0 mmHg LVOT Vmax:         82.20 cm/s LVOT Vmean:        51.100 cm/s LVOT VTI:          0.132 m LVOT/AV VTI ratio: 0.85  AORTA Ao Arch diam: 2.6 cm MITRAL VALVE MV Area (PHT): 2.60 cm    SHUNTS MV Decel Time: 292 msec    Systemic VTI:  0.13 m MV E velocity: 34.30 cm/s  Systemic Diam: 2.20 cm MV A velocity:  85.30 cm/s MV E/A ratio:  0.40 Nelva Bush MD Electronically signed by Nelva Bush MD Signature Date/Time: 12/07/2020/6:35:58 AM    Final    VAS US CAROTID  Result Date: 12/08/2020 Carotid Arterial Duplex Study Patient Name:  Sr. Demone Lyles Harig Sr.  Date of Exam:   12/06/2020 Medical Rec #: [id number]               Accession #:    [id number] Date of Birth: February 26, 1951               Patient Gender: M Patient Age:   69Y Exam Location:  [place] Procedure:      VAS US CAROTID Referring Phys: [id number] Robin Searing VISSER --------------------------------------------------------------------------------  Risk Factors:      Hypertension, hyperlipidemia, Diabetes, past history of                    smoking,  coronary artery disease, PAD. Other Factors:     Patient denies all cerebrovascular symptoms. Limitation         This was a TDS due to extraneous movement and gasping                    breaths. Comparison Study:  Carotid duplex on 10/02/17 showed RICA velocities of 53/16                    cm/s and LICA velocities of 20/25 cm/s Performing Technologist: Pilar Jarvis RDMS, RVT, RDCS  Examination Guidelines: A complete evaluation includes B-mode imaging, spectral Doppler, color Doppler, and power Doppler as needed of all accessible portions of each vessel. Bilateral testing is considered an integral part of a complete examination. Limited examinations for reoccurring indications may be performed as noted.  Right Carotid Findings: +----------+--------+--------+--------+-------------------+--------+           PSV cm/sEDV cm/sStenosisPlaque Description Comments +----------+--------+--------+--------+-------------------+--------+ CCA Prox  62      14                                          +----------+--------+--------+--------+-------------------+--------+ CCA Distal52      13                                          +----------+--------+--------+--------+-------------------+--------+ ICA Prox  66      12      1-39%   focal and irregular         +----------+--------+--------+--------+-------------------+--------+ ICA Mid   37      9       1-39%                               +----------+--------+--------+--------+-------------------+--------+ ICA Distal78      23                                          +----------+--------+--------+--------+-------------------+--------+ ECA       103     16                                          +----------+--------+--------+--------+-------------------+--------+ +----------+--------+-------+----------------+-------------------+  PSV cm/sEDV cmsDescribe        Arm Pressure (mmHG)  +----------+--------+-------+----------------+-------------------+ [id number]             Multiphasic, [id number]                 +----------+--------+-------+----------------+-------------------+ +---------+--------+--+--------+---------+ VertebralPSV cm/s41EDV cm/sAntegrade +---------+--------+--+--------+---------+  Left Carotid Findings: +----------+--------+--------+--------+------------------+--------+           PSV cm/sEDV cm/sStenosisPlaque DescriptionComments +----------+--------+--------+--------+------------------+--------+ CCA Prox  69      17                                         +----------+--------+--------+--------+------------------+--------+ CCA Distal50      16                                         +----------+--------+--------+--------+------------------+--------+ ICA Prox  44      14      1-39%   focal and calcific         +----------+--------+--------+--------+------------------+--------+ ICA Mid   61      20      1-39%                              +----------+--------+--------+--------+------------------+--------+ ICA Distal62      18                                         +----------+--------+--------+--------+------------------+--------+ ECA       127     20                                         +----------+--------+--------+--------+------------------+--------+ +----------+--------+--------+----------------+-------------------+           PSV cm/sEDV cm/sDescribe        Arm Pressure (mmHG) +----------+--------+--------+----------------+-------------------+ [id number]             Multiphasic, [id number]                 +----------+--------+--------+----------------+-------------------+ +---------+--------+--+--------+--+---------+ VertebralPSV cm/s39EDV cm/s12Antegrade +---------+--------+--+--------+--+---------+   Summary: Right Carotid: Velocities in the right ICA are consistent with a 1-39% stenosis.                 Non-hemodynamically significant plaque <50% noted in the CCA. The                ECA appears <50% stenosed. Left Carotid: Velocities in the left ICA are consistent with a 1-39% stenosis.               Non-hemodynamically significant plaque <50% noted in the CCA. The               ECA appears <50% stenosed. Vertebrals:  Bilateral vertebral arteries demonstrate antegrade flow. Subclavians: Normal flow hemodynamics were seen in bilateral subclavian              arteries. *See table(s) above for measurements and observations.  Electronically signed by Larae Grooms MD on 12/08/2020 at 8:32:35 AM.    Final     Assessment/Plan Essential hypertension blood pressure control important in reducing the progression of atherosclerotic disease.  On appropriate oral medications.   Diabetes mellitus type 2, uncontrolled, with complications (HCC) blood glucose control important in reducing the progression of atherosclerotic disease. Also, involved in wound healing. On appropriate medications.   Diabetic neuropathy (HCC) blood glucose control important in reducing the progression of atherosclerotic disease. Also, involved in wound healing. On appropriate medications.  His neuropathy is quite severe.   Hyperlipidemia lipid control important in reducing the progression of atherosclerotic disease. Continue statin therapy  Hx of BKA, left ([place]) Reasonably well, but did have some slough today.  I did some bedside debridement with scissors.  Change wet-to-dry dressing every day or 2.  Recheck in about 3 weeks.  Atherosclerosis of native arteries of the extremities with gangrene [facility]) Now status post left below-knee amputation.    Leotis Pain, MD  01/03/2021 12:01 PM    This note was created with Dragon medical transcription system.  Any errors from dictation are purely unintentional

## 2021-01-03 NOTE — Assessment & Plan Note (Signed)
Now status post left below-knee amputation.

## 2021-01-13 ENCOUNTER — Telehealth: Payer: 59

## 2021-01-22 NOTE — Progress Notes (Deleted)
  Plan BG - libre BP POCT A1C Anyone helping him around the house? Taking medications as prescribed?   5/31 - Vascular Surgery S/p left below-knee amputation Reasonably well, but did have some slough today.  I did some bedside debridement with scissors.  Change wet-to-dry dressing every day   5/9 - ED visit  near syncopal episode - Patient reports he got up this morning was feeling okay, took his medications and went back to sleep.  States got up afterwards and when he stood up felt lightheaded After IVFs patient's blood pressure did drop after sitting up. However patient stated that he felt much better and requested discharge home. Did discuss with patient that his blood pressure did drop however given that he is feeling better do think it is reasonable to discharge home. Discussed with patient importance of follow up and hydration.  4/25 - ED visit CC: Fall - status post left BKA who presents for assessment of a mild headache after he rolled out of his bed at his nursing facility striking his head CT head and neck showed no clear acute fracture dislocation.

## 2021-01-23 ENCOUNTER — Ambulatory Visit: Payer: 59

## 2021-01-24 ENCOUNTER — Ambulatory Visit (INDEPENDENT_AMBULATORY_CARE_PROVIDER_SITE_OTHER): Payer: 59 | Admitting: Family Medicine

## 2021-01-24 ENCOUNTER — Ambulatory Visit (INDEPENDENT_AMBULATORY_CARE_PROVIDER_SITE_OTHER): Payer: 59 | Admitting: Nurse Practitioner

## 2021-01-24 ENCOUNTER — Encounter: Payer: Self-pay | Admitting: Family Medicine

## 2021-01-24 ENCOUNTER — Other Ambulatory Visit: Payer: Self-pay

## 2021-01-24 ENCOUNTER — Ambulatory Visit (INDEPENDENT_AMBULATORY_CARE_PROVIDER_SITE_OTHER): Payer: 59 | Admitting: Pharmacist

## 2021-01-24 VITALS — BP 170/90 | HR 93 | Temp 97.8°F | Ht 72.0 in | Wt 202.0 lb

## 2021-01-24 VITALS — BP 149/101

## 2021-01-24 DIAGNOSIS — J432 Centrilobular emphysema: Secondary | ICD-10-CM

## 2021-01-24 DIAGNOSIS — I499 Cardiac arrhythmia, unspecified: Secondary | ICD-10-CM | POA: Diagnosis not present

## 2021-01-24 DIAGNOSIS — E118 Type 2 diabetes mellitus with unspecified complications: Secondary | ICD-10-CM | POA: Diagnosis not present

## 2021-01-24 DIAGNOSIS — I10 Essential (primary) hypertension: Secondary | ICD-10-CM

## 2021-01-24 DIAGNOSIS — E1165 Type 2 diabetes mellitus with hyperglycemia: Secondary | ICD-10-CM | POA: Diagnosis not present

## 2021-01-24 DIAGNOSIS — N183 Chronic kidney disease, stage 3 unspecified: Secondary | ICD-10-CM | POA: Diagnosis not present

## 2021-01-24 DIAGNOSIS — IMO0002 Reserved for concepts with insufficient information to code with codable children: Secondary | ICD-10-CM

## 2021-01-24 DIAGNOSIS — I5032 Chronic diastolic (congestive) heart failure: Secondary | ICD-10-CM

## 2021-01-24 LAB — BASIC METABOLIC PANEL
BUN: 22 mg/dL (ref 6–23)
CO2: 26 mEq/L (ref 19–32)
Calcium: 9.5 mg/dL (ref 8.4–10.5)
Chloride: 101 mEq/L (ref 96–112)
Creatinine, Ser: 1.15 mg/dL (ref 0.40–1.50)
GFR: 64.71 mL/min (ref >60.00)
Glucose, Bld: 71 mg/dL (ref 70–99)
Potassium: 3.8 mEq/L (ref 3.5–5.1)
Sodium: 136 mEq/L (ref 135–145)

## 2021-01-24 LAB — POCT GLYCOSYLATED HEMOGLOBIN (HGB A1C): Hemoglobin A1C: 6.3 % — AB (ref 4.0–5.6)

## 2021-01-24 MED ORDER — SPIRONOLACTONE 25 MG PO TABS: 25.0000 mg | ORAL_TABLET | Freq: Every day | ORAL | Status: DC

## 2021-01-24 MED ORDER — SACUBITRIL-VALSARTAN 49-51 MG PO TABS: 1.0000 | ORAL_TABLET | Freq: Two times a day (BID) | ORAL | 2 refills | Status: DC

## 2021-01-24 MED ORDER — METFORMIN HCL ER 500 MG PO TB24: 500.0000 mg | ORAL_TABLET | Freq: Two times a day (BID) | ORAL | 1 refills | Status: DC

## 2021-01-24 MED ORDER — FLUTICASONE-SALMETEROL 250-50 MCG/ACT IN AEPB: 1.0000 | INHALATION_SPRAY | Freq: Two times a day (BID) | RESPIRATORY_TRACT | 1 refills | Status: DC

## 2021-01-24 MED ORDER — CARVEDILOL 12.5 MG PO TABS: 12.5000 mg | ORAL_TABLET | Freq: Two times a day (BID) | ORAL | 3 refills | Status: DC

## 2021-01-24 MED ORDER — VENTOLIN HFA 108 (90 BASE) MCG/ACT IN AERS: 2.0000 | INHALATION_SPRAY | Freq: Four times a day (QID) | RESPIRATORY_TRACT | 3 refills | Status: DC | PRN

## 2021-01-24 NOTE — Assessment & Plan Note (Addendum)
Uncontrolled no the patient has not taken his medications today.  Our clinical pharmacist is going to follow-up with him later this afternoon once he is at home to go over his medications and reinforce that he needs to take these.  Reinforced the need for him to take his medications including Entresto, spironolactone, Imdur, carvedilol, and Lasix.  We will check a BMP today given BP elevation.

## 2021-01-24 NOTE — Chronic Care Management (AMB) (Signed)
Chronic Care Management Pharmacy Note  01/24/2021 Name:  Danny Kehoe Folkert Sr. MRN:  [id number] DOB:  1950/11/18   Subjective: Danny Estimable Steines Sr. is an 70 y.o. year old male who is a primary patient of [name], [name], MD.  The CCM team was consulted for assistance with disease management and care coordination needs.    Engaged with patient face to face for follow up visit in response to provider referral for pharmacy case management and/or care coordination services.   Consent to Services:  The patient was given information about Chronic Care Management services, agreed to services, and gave verbal consent prior to initiation of services.  Please see initial visit note for detailed documentation.   Patient Care Team: [name], MD as PCP - General (Family Medicine) Rockey Situ Kathlene November, MD as PCP - Cardiology (Cardiology) [name], MD as Consulting Physician (Cardiology) De Hollingshead, RPH-CPP as Pharmacist (Pharmacist)  Recent office visits: None recently   Recent consult visits: Follow up with vascular, podiatry regarding recent [facility] visits: 3/28 [facility] for LLE angiography, stent placed to proximal left SFA 4/3-4/12[facility] for sepsis related to gangrene of left first toe, starting second toe, gangrene down foot. Left BKA. Also started on Entresto and spironolactone while in hospital (ECHO 35-40%)  Objective:  Lab Results  Component Value Date   CREATININE 1.15 01/24/2021   CREATININE 1.24 12/12/2020   CREATININE 0.98 11/13/2020    Lab Results  Component Value Date   HGBA1C 6.3 (A) 01/24/2021   Last diabetic Eye exam:  Lab Results  Component Value Date/Time   HMDIABEYEEXA No Retinopathy 04/25/2018 12:56 PM    Last diabetic Foot exam: No results found for: HMDIABFOOTEX      Component Value Date/Time   CHOL 96 10/26/2020 1048   TRIG 60 10/26/2020 1048   HDL 46 10/26/2020 1048   CHOLHDL 2.1 10/26/2020 1048   VLDL 12 10/26/2020  1048   LDLCALC 38 10/26/2020 1048   LDLDIRECT 64.0 03/20/2018 0950    Hepatic Function Latest Ref Rng & Units 12/12/2020 11/09/2020 11/06/2020  Total Protein 6.5 - 8.1 g/dL 6.0(L) 6.5 7.0  Albumin 3.5 - 5.0 g/dL 3.0(L) 2.4(L) 3.0(L)  AST 15 - 41 U/L 18 46(H) 74(H)  ALT 0 - 44 U/L 24 60(H) 99(H)  Alk Phosphatase 38 - 126 U/L 92 120 129(H)  Total Bilirubin 0.3 - 1.2 mg/dL 0.5 0.8 0.8  Bilirubin, Direct 0.0 - 0.2 mg/dL - - -    No results found for: TSH, FREET4  CBC Latest Ref Rng & Units 12/12/2020 11/28/2020 11/12/2020  WBC 4.0 - 10.5 K/uL 9.9 10.3 13.8(H)  Hemoglobin 13.0 - 17.0 g/dL 13.4 13.3 11.1(L)  Hematocrit 39.0 - 52.0 % 42.4 42.0 33.8(L)  Platelets 150 - 400 K/uL 239 397 486(H)    Clinical ASCVD: Yes  The ASCVD Risk score Mikey Bussing DC Jr., et al., 2013) failed to calculate for the following reasons:   The valid total cholesterol range is 130 to 320 mg/dL    Social History   Tobacco Use  Smoking Status Former   Packs/day: 3.00   Years: 0.00   Pack years: 0.00   Types: Cigarettes   Quit date: 09/07/2009   Years since quitting: 11.3  Smokeless Tobacco Never  Tobacco Comments   quit june 2011   BP Readings from Last 3 Encounters:  01/24/21 (!) 170/90  01/24/21 (!) 149/101  01/03/21 98/63   Pulse Readings from Last 3 Encounters:  01/24/21 93  01/03/21 89  12/16/20 (!) 53   Wt Readings from Last 3 Encounters:  01/24/21 202 lb (91.6 kg)  01/03/21 202 lb (91.6 kg)  12/12/20 209 lb (94.8 kg)    Assessment: Review of patient past medical history, allergies, medications, health status, including review of consultants reports, laboratory and other test data, was performed as part of comprehensive evaluation and provision of chronic care management services.   SDOH:  (Social Determinants of Health) assessments and interventions performed:    CCM Care Plan  No Known Allergies  Medications Reviewed Today     Reviewed by De Hollingshead, RPH-CPP (Pharmacist) on 01/24/21  at 1620  Med List Status: <None>   Medication Order Taking? Sig Documenting Provider Last Dose Status Informant  ACCU-CHEK GUIDE test strip [id number]  CHECK CBGS 4 TIMES A DAY [name], MD  Active Self  albuterol (PROVENTIL) (2.5 MG/3ML) 0.083% nebulizer solution [id number] No Take 3 mLs (2.5 mg total) by nebulization every 6 (six) hours as needed for wheezing or shortness of breath.  Patient not taking: Reported on 01/24/2021   [name], MD Not Taking Active   aspirin 81 MG tablet [id number]  Take 81 mg by mouth daily. [provider]  Active Self  atorvastatin (LIPITOR) 80 MG tablet [id number] Yes TAKE 1 TABLET BY MOUTH EVERY DAY [name], MD Taking Active   baclofen (LIORESAL) 10 MG tablet [id number]  TAKE 1 TABLET BY MOUTH THREE TIMES A DAY AS NEEDED FOR MUSCLE SPASMS [name], MD  Active Self           Med Note ([place] Jan 24, 2021 10:33 AM) Taking about every other day  blood glucose meter kit and supplies KIT [id number]  Dispense based on patient and insurance preference. Check CBGs 4 times daily. Dx code E11.9. [name], MD  Active Self  carvedilol (COREG) 12.5 MG tablet [id number] No Take 1 tablet (12.5 mg total) by mouth 2 (two) times daily with a meal.  Patient not taking: Reported on 01/24/2021   [name], MD Not Taking Active   clopidogrel (PLAVIX) 75 MG tablet [id number] Yes TAKE 1 TABLET BY MOUTH EVERY DAY Gollan, Kathlene November, MD Taking Active Self  Dulaglutide (TRULICITY) 4.5 QA/4.4LP SOPN [id number] Yes Inject 4.5 mg as directed once a week. [name], MD Taking Active Self           Med Note Juanetta Snow Nov 07, 2020  7:57 AM) Monday   escitalopram (LEXAPRO) 10 MG tablet [id number] No TAKE 1 TABLET BY MOUTH EVERY DAY  Patient not taking: Reported on 01/24/2021   Einar Pheasant, MD Not Taking Active Self  ezetimibe (ZETIA) 10 MG tablet [id number] Yes TAKE 1 TABLET BY MOUTH EVERY DAY  [name], MD Taking Active Self  fluticasone-salmeterol (ADVAIR) 250-50 MCG/ACT AEPB [id number] No Inhale 1 puff into the lungs 2 (two) times daily.  Patient not taking: Reported on 01/24/2021   [name], MD Not Taking Active   furosemide (LASIX) 20 MG tablet [id number] Yes TAKE 1 TABLET (20 MG) BY MOUTH ONCE EVERY OTHER DAY Gollan, Kathlene November, MD Taking Active Self  gabapentin (NEURONTIN) 300 MG capsule [id number] Yes Take 1 capsule (300 mg total) by mouth at bedtime. Kris Hartmann, NP Taking Active   insulin degludec (TRESIBA FLEXTOUCH) 100 UNIT/ML FlexTouch Pen [id number] Yes Inject 25 Units into the skin daily. Sharen Hones, MD  Taking Active            Med Note Darnelle Maffucci, [place] Jan 24, 2021 10:34 AM) Taking 40 units   Insulin Pen Needle (PEN NEEDLES) 32G X 6 MM MISC [id number] Yes 1 each by Does not apply route 2 (two) times daily. [name], MD Taking Active Self  isosorbide mononitrate (IMDUR) 30 MG 24 hr tablet [id number] Yes TAKE 1 TABLET BY MOUTH EVERY DAY [name], MD Taking Active Self  JARDIANCE 25 MG TABS tablet [id number] Yes TAKE 1 TABLET BY MOUTH DAILY [name], MD Taking Active   nitroGLYCERIN (NITROSTAT) 0.4 MG SL tablet [id number] No Place 1 tablet (0.4 mg total) under the tongue every 5 (five) minutes as needed for chest pain. Maximum of 3 doses. Marrianne Mood D, PA-C Not Taking Active   oxyCODONE-acetaminophen (PERCOCET) 7.5-325 MG tablet [id number] No Take 1 tablet by mouth every 8 (eight) hours as needed for severe pain. Sharen Hones, MD Not Taking Active   sacubitril-valsartan [facility]) 49-51 MG [id number] No Take 1 tablet by mouth 2 (two) times daily. STOP LISINOPRIL.  Patient not taking: Reported on 01/24/2021   [name], MD Not Taking Active   silver sulfADIAZINE (SILVADENE) 1 % cream [id number]  Apply 1 application topically daily. Kris Hartmann, NP  Active   spironolactone (ALDACTONE) 25 MG tablet [id number]  Yes Take 1 tablet (25 mg total) by mouth daily. [name], MD Taking Active   tamsulosin [facility]) 0.4 MG CAPS capsule [id number] Yes TAKE 1 CAPSULE BY MOUTH EVERY DAY Einar Pheasant, MD Taking Active Self  VENTOLIN HFA 108 (90 Base) MCG/ACT inhaler [id number] Yes Inhale 2 puffs into the lungs every 6 (six) hours as needed for wheezing or shortness of breath. [name], MD Taking Active             Patient Active Problem List   Diagnosis Date Noted   Irregular heart beat 01/24/2021   Hx of BKA, left ([place]) 12/16/2020   Reactive thrombocytosis 79/10/8331   Chronic systolic heart failure ([place])    Cellulitis of left foot 11/09/2020   HFrEF (heart failure with reduced ejection fraction) ([place])    Thrush    Sepsis ([place]) 11/06/2020   Gangrene of left foot ([place])    PVD (peripheral vascular disease) ([place])    Stage 2 chronic kidney disease    Atherosclerosis of native arteries of the extremities with gangrene ([place]) 10/28/2020   Low back pain 06/29/2020   Onychomycosis 03/29/2020   Fall 03/29/2020   AKI (acute kidney injury) ([place]) 12/28/2019   Coronary artery disease of native artery of native heart with stable angina pectoris ([place]) 12/12/2018   Pure hypercholesterolemia 12/12/2018   Chest pain 10/31/2018   Atherosclerosis of native arteries of extremity with intermittent claudication ([place]) 02/11/2018   Decreased pedal pulses 07/02/2017   Orthostasis 05/07/2017   Anxiety and depression 05/07/2017   CKD (chronic kidney disease) stage 3, GFR 30-59 ml/min (HCC) 03/06/2017   Diabetic neuropathy (HCC) 03/06/2017   Chronic diastolic CHF (congestive heart failure) ([place]) 03/05/2017   Morbid obesity ([place]) 06/18/2016   H/O medication noncompliance 06/18/2016   COPD with chronic bronchitis ([place]) 06/20/2015   GERD (gastroesophageal reflux disease) 12/20/2014   Type 2 diabetes mellitus with hyperlipidemia ([place]) 06/05/2010   Hyperlipidemia 02/10/2010   Essential hypertension  02/10/2010   CAD (coronary artery disease) 02/10/2010    Immunization History  Administered Date(s) Administered   Fluad Quad(high Dose 65+)  08/02/2019, 09/30/2020   Influenza, High Dose Seasonal PF 09/01/2018   Moderna Sars-Covid-2 Vaccination 04/07/2020, 05/09/2020   Pneumococcal Polysaccharide-23 08/02/2019   Tdap 11/28/2020    Conditions to be addressed/monitored: CHF, HTN, HLD, DMII, and CKD  Care Plan : Medication Management  Updates made by De Hollingshead, RPH-CPP since 01/24/2021 12:00 AM     Problem: Diabetes, Hypertension, Depression, CHF      Long-Range Goal: Disease Progression Prevention   This Visit's Progress: On track  Recent Progress: On track  Priority: High  Note:   Current Barriers:  Unable to independently monitor therapeutic efficacy Unable to achieve control of diabetes  Low level of healthcare literacy  Pharmacist Clinical Goal(s):  Over the next 90 days, patient will achieve adherence to monitoring guidelines and medication adherence to achieve therapeutic efficacy. Over the next 90 days, patient will improve control of diabetes as evidenced by A1c.   Interventions: 1:1 collaboration with [name], MD regarding development and update of comprehensive plan of care as evidenced by provider attestation and co-signature Inter-disciplinary care team collaboration (see longitudinal plan of care) Comprehensive medication review performed; medication list updated in electronic medical record  Health Maintenance: S/p R BKA 11/10/20. At SNF until ~ 3 weeks ago.  Filled pill box for the week today prior to my call.   Diabetes: CONTROLLED per office A1c today, 6.3%, current treatment: Trulicity 4.5 mg weekly, Jardiance 25 mg daily, Tresiba 40 units daily, metformin XR 500 mg daily Metformin d/c at hospital discharge related to renal function, however, appears he has continued to take it.  Current glucose readings:  has not been checking.  Previously using Libre 2 CGM but has not placed since he came home from rehab. Notes that his son moved in with him and moved stuff, so he does not know where his reader is.  Denies any episodes or symptoms of hypoglycemia. Denies vision changes, polydipsia, polyphagia, does note some polyuria.  Encouraged to restart scanning glucose at least three times daily. Patient notes that he will find his reader and start using.  Continue current regimen at this time. Renal function appropriate to continue metformin therapy. Sent refill today.   Hypertension, CAD, CHF (most recent EF 35-40%) Unsure if managed appropriately; current treatment: carvedilol 12.5 mg BID- patient requests refill , furosemide 20 mg every other day, isosorbide 30 mg daily, Entresto 49/51 mg BID - does not have a bottle, but per refill history he just completed supply, spironolactone 25 mg daily- requests refill. Per medication bottle review, he has still been taking lisinopril and potassium.  BP today: 141/101, HR 82 - rhythm sounded irregular while performing manual BP check. Patient did not take his medications before coming in today. Discussed with Dr. [name]. Evaluated.  Renal function and potassium appropriate to continue new regimen of spironolactone and Entresto. Patient threw away his bottles for lisinopril and potassium while we were on the phone. Will call on Monday to review pill bottles that he is picking up prior to him filling his pill box next week. Recommend to continue current regimen at this time.   Hyperlipidemia and ASCVD risk reduction Very well controlled; current treatment: atorvastatin 80 mg daily, ezetimibe 10 mg daily; Current antiplatelet regimen: aspirin 81 mg daily, clopidogrel 75 mg daily Recommended to continue current regimen at this time.   Depression/Anxiety: Appropriately well controlled given recent life stressors; escitalopram 10 mg daily Recommended to continue current regimen at this  time  BPH Appropriately well controlled per patient  symptom report; tamsulosin 0.4 mg daily Recommended to continue current regimen at this time  Moderate Obstructive Lung Disease: Uncontrolled per patient symptom report; notes he has not been taking Advair and ran out of albuterol Reviewed maintenance vs as needed regimen. Counseled to take Advair twice daily every day, only use albuterol PRN. Refills sent per patient request  Patient Goals/Self-Care Activities Over the next 90 days, patient will:  - take medications as prescribed check blood glucose at least three times daily using CGM, document, and provide at future appointments collaborate with provider on medication access solutions  Follow Up Plan: Telephone follow up appointment with care management team member scheduled for: ~ 1 week      Medication Assistance: None required.  Patient affirms current coverage meets needs.  Patient's preferred pharmacy is:  [facility], [address] [place] [address] Phone: [phone] Fax: [phone]  [id number] [place] [place] [address]Phone: [phone]: [phone] Follow Up:  Patient agrees to Care Plan and Follow-up.  Plan: Telephone follow up appointment with care management team member scheduled for:  ~ 1 week  Catie TDarnelle Maffucci PharmD, [place] CPanolaClinical Pharmacist LOccidental Petroleumat BJohnson & [phone]

## 2021-01-24 NOTE — Patient Instructions (Addendum)
Nice to see you. Your EKG revealed some extra heartbeats.  If you are not having symptoms there is nothing to do for this at this time.  You need to contact cardiology for follow-up. [phone]. Please call Catie this afternoon regarding your BP meds.

## 2021-01-24 NOTE — Progress Notes (Signed)
Danny Rumps, MD Phone: [phone]  Danny Estimable Hawker Sr. is a 70 y.o. male who presents today for same day visit.  Irregular heartbeat: This is noted by our clinical pharmacist on blood pressure measurement.  Patient notes no recent palpitations.  No chest pain.  No shortness of breath.  HTN: elevated today. He has not taken his medications. He was at a SNF and did not trust what they were doing for him so he has not been consistently taking his new medications from his most recent hospitalization. He was started on spironolactone and entresto.   Social History   Tobacco Use  Smoking Status Former   Packs/day: 3.00   Years: 0.00   Pack years: 0.00   Types: Cigarettes   Quit date: 09/07/2009   Years since quitting: 11.3  Smokeless Tobacco Never  Tobacco Comments   quit june 2011    Current Outpatient Medications on File Prior to Visit  Medication Sig Dispense Refill   ACCU-CHEK GUIDE test strip CHECK CBGS 4 TIMES A DAY 300 strip 1   albuterol (PROVENTIL) (2.5 MG/3ML) 0.083% nebulizer solution Take 3 mLs (2.5 mg total) by nebulization every 6 (six) hours as needed for wheezing or shortness of breath. 150 mL 1   aspirin 81 MG tablet Take 81 mg by mouth daily.     atorvastatin (LIPITOR) 80 MG tablet TAKE 1 TABLET BY MOUTH EVERY DAY 90 tablet 3   baclofen (LIORESAL) 10 MG tablet TAKE 1 TABLET BY MOUTH THREE TIMES A DAY AS NEEDED FOR MUSCLE SPASMS 30 tablet 0   blood glucose meter kit and supplies KIT Dispense based on patient and insurance preference. Check CBGs 4 times daily. Dx code E11.9. 1 each 0   carvedilol (COREG) 12.5 MG tablet TAKE 1 TABLET (12.5 MG TOTAL) BY MOUTH 2 (TWO) TIMES DAILY WITH A MEAL. 180 tablet 3   clopidogrel (PLAVIX) 75 MG tablet TAKE 1 TABLET BY MOUTH EVERY DAY 90 tablet 3   Dulaglutide (TRULICITY) 4.5 MW/4.1LK SOPN Inject 4.5 mg as directed once a week. 6 mL 3   escitalopram (LEXAPRO) 10 MG tablet TAKE 1 TABLET BY MOUTH EVERY DAY 90 tablet 1   ezetimibe  (ZETIA) 10 MG tablet TAKE 1 TABLET BY MOUTH EVERY DAY 90 tablet 3   fluticasone-salmeterol (ADVAIR) 250-50 MCG/ACT AEPB Inhale 1 puff into the lungs 2 (two) times daily. 3 each 1   furosemide (LASIX) 20 MG tablet TAKE 1 TABLET (20 MG) BY MOUTH ONCE EVERY OTHER DAY 45 tablet 3   gabapentin (NEURONTIN) 300 MG capsule Take 1 capsule (300 mg total) by mouth at bedtime. 30 capsule 3   insulin degludec (TRESIBA FLEXTOUCH) 100 UNIT/ML FlexTouch Pen Inject 25 Units into the skin daily. 45 mL 3   Insulin Pen Needle (PEN NEEDLES) 32G X 6 MM MISC 1 each by Does not apply route 2 (two) times daily. 100 each 11   isosorbide mononitrate (IMDUR) 30 MG 24 hr tablet TAKE 1 TABLET BY MOUTH EVERY DAY 90 tablet 3   JARDIANCE 25 MG TABS tablet TAKE 1 TABLET BY MOUTH DAILY 90 tablet 3   nitroGLYCERIN (NITROSTAT) 0.4 MG SL tablet Place 1 tablet (0.4 mg total) under the tongue every 5 (five) minutes as needed for chest pain. Maximum of 3 doses. 25 tablet 3   oxyCODONE-acetaminophen (PERCOCET) 7.5-325 MG tablet Take 1 tablet by mouth every 8 (eight) hours as needed for severe pain. 12 tablet 0   sacubitril-valsartan (ENTRESTO) 49-51 MG Take 1 tablet by  mouth 2 (two) times daily. 60 tablet 0   silver sulfADIAZINE (SILVADENE) 1 % cream Apply 1 application topically daily. 50 g 0   spironolactone (ALDACTONE) 25 MG tablet Take 1 tablet (25 mg total) by mouth daily.     tamsulosin (FLOMAX) 0.4 MG CAPS capsule TAKE 1 CAPSULE BY MOUTH EVERY DAY 90 capsule 0   VENTOLIN HFA 108 (90 Base) MCG/ACT inhaler Inhale 2 puffs into the lungs every 6 (six) hours as needed for wheezing or shortness of breath. 18 g 3   No current facility-administered medications on file prior to visit.     ROS see history of present illness  Objective  Physical Exam Vitals:   01/24/21 1155 01/24/21 1203  BP: (!) 182/110 (!) 170/90  Pulse: 93   Temp: 97.8 F (36.6 C)   SpO2: 95%     BP Readings from Last 3 Encounters:  01/24/21 (!) 170/90   01/24/21 (!) 149/101  01/03/21 98/63   Wt Readings from Last 3 Encounters:  01/24/21 202 lb (91.6 kg)  01/03/21 202 lb (91.6 kg)  12/12/20 209 lb (94.8 kg)    Physical Exam Constitutional:      General: He is not in acute distress.    Appearance: He is not diaphoretic.  Cardiovascular:     Rate and Rhythm: Normal rate. Rhythm irregularly irregular.     Heart sounds: Normal heart sounds.  Neurological:     Mental Status: He is alert.   EKG: Sinus tachycardia, occasional PAC, nonspecific ST depression in V5 and V6 that appears similar to prior  Assessment/Plan: Please see individual problem list.  Problem List Items Addressed This Visit     Essential hypertension    Uncontrolled no the patient has not taken his medications today.  Our clinical pharmacist is going to follow-up with him later this afternoon once he is at home to go over his medications and reinforce that he needs to take these.  Reinforced the need for him to take his medications including Entresto, spironolactone, Imdur, carvedilol, and Lasix.  We will check a BMP today given BP elevation.       Relevant Orders   Basic Metabolic Panel (BMET)   Irregular heart beat - Primary    Related to PACs seen on EKG.  His EKG appears generally stable from his last EKG May.  We will monitor for palpitations.  He will follow-up with cardiology.       Relevant Orders   EKG 12-Lead (Completed)    Return in about 3 weeks (around 02/14/2021).  This visit occurred during the SARS-CoV-2 public health emergency.  Safety protocols were in place, including screening questions prior to the visit, additional usage of staff PPE, and extensive cleaning of exam room while observing appropriate contact time as indicated for disinfecting solutions.    Danny Rumps, MD [place]

## 2021-01-24 NOTE — Patient Instructions (Addendum)
It was great to see you today!  Congratulations on that A1C!  This is how you should fill your pill box:   Morning: - Isosorbide 30 mg (heart) - Carvedilol 12.5 mg (blood pressure/heart) - Entresto 49/51 mg (blood pressure/heart) - Spironolactone 25 mg (blood pressure/heart) - Aspirin 81 mg (stroke prevention) - Clopidogrel 75 mg (stent protection) - Metformin XR 500 mg (diabetes) - Jardiance 25 mg (diabetes)  Evening: - Carvedilol 12.5 mg (blood pressure/heart) - Entresto 49/51 mg (blood pressure/heart) - Atorvastatin 80 mg (cholesterol) - Ezetimibe 10 mg (cholesterol) - Escitalopram 10 mg (mood)  - Gabapentin 300 mg (pain)  STOP THE LISINOPRIL AND POTASSIUM. TAKE THE LISINOPRIL OUT OF YOUR PILL BOX.   Call the cardiology office and reschedule a sooner follow up to talk about your heart ( [phone] )  Find your FreeStyle [place] reader and start using the FreeStyle [place] again.  Catie Feliz Beam, PharmD [phone]  Visit Information  PATIENT GOALS:  Goals Addressed               This Visit's Progress     Patient Stated     Medication Monitoring (pt-stated)        Patient Goals/Self-Care Activities Over the next 90 days, patient will:  - take medications as prescribed check blood glucose at least three times daily using CGM, document, and provide at future appointments collaborate with provider on medication access solutions.         Patient verbalizes understanding of instructions provided today and agrees to view in MyChart.   Plan: Telephone follow up appointment with care management team member scheduled for:  ~ 1 week   Catie Feliz Beam, PharmD, [place], CPP Clinical Pharmacist [employer] [facility] [employer] [phone]

## 2021-01-24 NOTE — Assessment & Plan Note (Addendum)
Related to PACs seen on EKG.  His EKG appears generally stable from his last EKG May.  We will monitor for palpitations.  He will call cardiology for follow-up.

## 2021-01-27 ENCOUNTER — Telehealth: Payer: Self-pay | Admitting: Family Medicine

## 2021-01-27 NOTE — Telephone Encounter (Signed)
   Telephone encounter was:  Successful.  01/27/2021 Name: Eldridge Marcott Carp Sr. MRN: [id number] DOB: 03-30-1951  Gerrit Friends Monks Sr. is a 70 y.o. year old male who is a primary care patient of [name], Yehuda Mao, MD . The community resource team was consulted for assistance with Transportation Needs   Care guide performed the following interventions: Patient provided with information about care guide support team and interviewed to confirm resource needs Discussed resources to assist with transportation. Pt stated that he has two upcoming appointments 7/1 and 7/11 that he needs scheduling for, will place a referral with [facility] transportation. Pt also doesn't have email so I will mail him a letter with his transportation options including his insurance benefits to him.  Obtained verbal consent to place patient referral to [facility] .  Follow Up Plan:  Care guide will follow up with patient by phone over the next week and Care guide will outreach resources to assist patient with week  [name] Care Guide, Embedded Care Coordination [facility], Care Management Phone: [phone] Email: [email or web address]

## 2021-01-30 ENCOUNTER — Telehealth: Payer: Self-pay | Admitting: Family Medicine

## 2021-01-30 ENCOUNTER — Ambulatory Visit: Payer: 59 | Admitting: Pharmacist

## 2021-01-30 DIAGNOSIS — I10 Essential (primary) hypertension: Secondary | ICD-10-CM | POA: Diagnosis not present

## 2021-01-30 DIAGNOSIS — N183 Chronic kidney disease, stage 3 unspecified: Secondary | ICD-10-CM

## 2021-01-30 DIAGNOSIS — J432 Centrilobular emphysema: Secondary | ICD-10-CM | POA: Diagnosis not present

## 2021-01-30 DIAGNOSIS — E118 Type 2 diabetes mellitus with unspecified complications: Secondary | ICD-10-CM | POA: Diagnosis not present

## 2021-01-30 DIAGNOSIS — I5032 Chronic diastolic (congestive) heart failure: Secondary | ICD-10-CM

## 2021-01-30 DIAGNOSIS — E785 Hyperlipidemia, unspecified: Secondary | ICD-10-CM

## 2021-01-30 DIAGNOSIS — I2510 Atherosclerotic heart disease of native coronary artery without angina pectoris: Secondary | ICD-10-CM

## 2021-01-30 MED ORDER — SPIRONOLACTONE 25 MG PO TABS: 25.0000 mg | ORAL_TABLET | Freq: Every day | ORAL | 1 refills | Status: DC

## 2021-01-30 MED ORDER — EZETIMIBE 10 MG PO TABS: 10.0000 mg | ORAL_TABLET | Freq: Every day | ORAL | 3 refills | Status: DC

## 2021-01-30 NOTE — Telephone Encounter (Signed)
   Telephone encounter was:  Successful.  01/30/2021 Name: Danny Hosick Bigford Sr. MRN: [id number] DOB: Nov 21, 1950  Danny Friends Catena Sr. is a 70 y.o. year old male who is a primary care patient of [name], MD . The community resource team was consulted for assistance with Transportation Needs   Care guide performed the following interventions: Placed referral to [facility] Transportation for office visit on 7/1 and  7/11 via email. I also sent the patient a letter for his records on how to request transportation in the future using his insurance and [employer] resources. Follow up call placed to community resources to determine status of patients referral.  Follow Up Plan:  No further follow up planned at this time. The patient has been provided with needed resources.  [name] Care Guide, Embedded Care Coordination [facility], Care Management Phone: [phone] Email: [email or web address]

## 2021-01-30 NOTE — Addendum Note (Signed)
Addended by: Lourena Simmonds on: 01/30/2021 12:03 PM   Modules accepted: Orders

## 2021-01-30 NOTE — Patient Instructions (Signed)
Visit Information  PATIENT GOALS:  Goals Addressed               This Visit's Progress     Patient Stated     Medication Monitoring (pt-stated)        Patient Goals/Self-Care Activities Over the next 90 days, patient will:  - take medications as prescribed check blood glucose at least three times daily using CGM, document, and provide at future appointments collaborate with provider on medication access solutions .         Patient verbalizes understanding of instructions provided today and agrees to view in MyChart.   Plan: Face to Face appointment with care management team member scheduled for: ~ 2 weeks  Catie Shauni Henner, PharmD, BCACP, CPP Clinical Pharmacist [facility] [place] [phone] 

## 2021-01-30 NOTE — Chronic Care Management (AMB) (Signed)
Chronic Care Management Pharmacy Note  01/30/2021 Name:  Danny Lill Corpus Sr. MRN:  [id number] DOB:  Feb 17, 1951   Subjective: Danny Estimable Rader Sr. is an 70 y.o. year old male who is a primary patient of [name], [name], MD.  The CCM team was consulted for assistance with disease management and care coordination needs.    Engaged with patient by telephone for follow up visit in response to provider referral for pharmacy case management and/or care coordination services.   Consent to Services:  The patient was given information about Chronic Care Management services, agreed to services, and gave verbal consent prior to initiation of services.  Please see initial visit note for detailed documentation.   Patient Care Team: [name], MD as PCP - General (Family Medicine) [name], MD as PCP - Cardiology (Cardiology) [name], MD as Consulting Physician (Cardiology) De Hollingshead, RPH-CPP as Pharmacist (Pharmacist)   Objective:  Lab Results  Component Value Date   CREATININE 1.15 01/24/2021   CREATININE 1.24 12/12/2020   CREATININE 0.98 11/13/2020    Lab Results  Component Value Date   HGBA1C 6.3 (A) 01/24/2021   Last diabetic Eye exam:  Lab Results  Component Value Date/Time   HMDIABEYEEXA No Retinopathy 04/25/2018 12:56 PM    Last diabetic Foot exam: No results found for: HMDIABFOOTEX      Component Value Date/Time   CHOL 96 10/26/2020 1048   TRIG 60 10/26/2020 1048   HDL 46 10/26/2020 1048   CHOLHDL 2.1 10/26/2020 1048   VLDL 12 10/26/2020 1048   LDLCALC 38 10/26/2020 1048   LDLDIRECT 64.0 03/20/2018 0950    Hepatic Function Latest Ref Rng & Units 12/12/2020 11/09/2020 11/06/2020  Total Protein 6.5 - 8.1 g/dL 6.0(L) 6.5 7.0  Albumin 3.5 - 5.0 g/dL 3.0(L) 2.4(L) 3.0(L)  AST 15 - 41 U/L 18 46(H) 74(H)  ALT 0 - 44 U/L 24 60(H) 99(H)  Alk Phosphatase 38 - 126 U/L 92 120 129(H)  Total Bilirubin 0.3 - 1.2 mg/dL 0.5 0.8 0.8  Bilirubin,  Direct 0.0 - 0.2 mg/dL - - -    No results found for: TSH, FREET4  CBC Latest Ref Rng & Units 12/12/2020 11/28/2020 11/12/2020  WBC 4.0 - 10.5 K/uL 9.9 10.3 13.8(H)  Hemoglobin 13.0 - 17.0 g/dL 13.4 13.3 11.1(L)  Hematocrit 39.0 - 52.0 % 42.4 42.0 33.8(L)  Platelets 150 - 400 K/uL 239 397 486(H)    No results found for: VD25OH  Clinical ASCVD: Yes  The ASCVD Risk score Mikey Bussing DC Jr., et al., 2013) failed to calculate for the following reasons:   The valid total cholesterol range is 130 to 320 mg/dL     Social History   Tobacco Use  Smoking Status Former   Packs/day: 3.00   Years: 0.00   Pack years: 0.00   Types: Cigarettes   Quit date: 09/07/2009   Years since quitting: 11.4  Smokeless Tobacco Never  Tobacco Comments   quit june 2011   BP Readings from Last 3 Encounters:  01/24/21 (!) 170/90  01/24/21 (!) 149/101  01/03/21 98/63   Pulse Readings from Last 3 Encounters:  01/24/21 93  01/03/21 89  12/16/20 (!) 53   Wt Readings from Last 3 Encounters:  01/24/21 202 lb (91.6 kg)  01/03/21 202 lb (91.6 kg)  12/12/20 209 lb (94.8 kg)    Assessment: Review of patient past medical history, allergies, medications, health status, including review of consultants reports, laboratory and  other test data, was performed as part of comprehensive evaluation and provision of chronic care management services.   SDOH:  (Social Determinants of Health) assessments and interventions performed:  SDOH Interventions    Flowsheet Row Most Recent Value  SDOH Interventions   Financial Strain Interventions Intervention Not Indicated       CCM Care Plan  No Known Allergies  Medications Reviewed Today     Reviewed by De Hollingshead, RPH-CPP (Pharmacist) on 01/24/21 at 46  Med List Status: <None>   Medication Order Taking? Sig Documenting Provider Last Dose Status Informant  ACCU-CHEK GUIDE test strip [id number]  CHECK CBGS 4 TIMES A DAY [name], MD  Active Self   albuterol (PROVENTIL) (2.5 MG/3ML) 0.083% nebulizer solution [id number] No Take 3 mLs (2.5 mg total) by nebulization every 6 (six) hours as needed for wheezing or shortness of breath.  Patient not taking: Reported on 01/24/2021   [name], MD Not Taking Active   aspirin 81 MG tablet [id number]  Take 81 mg by mouth daily. [provider]  Active Self  atorvastatin (LIPITOR) 80 MG tablet [id number] Yes TAKE 1 TABLET BY MOUTH EVERY DAY [name], MD Taking Active   baclofen (LIORESAL) 10 MG tablet [id number]  TAKE 1 TABLET BY MOUTH THREE TIMES A DAY AS NEEDED FOR MUSCLE SPASMS [name], MD  Active Self           Med Note ([place] Jan 24, 2021 10:33 AM) Taking about every other day  blood glucose meter kit and supplies KIT [id number]  Dispense based on patient and insurance preference. Check CBGs 4 times daily. Dx code E11.9. [name], MD  Active Self  carvedilol (COREG) 12.5 MG tablet [id number] No Take 1 tablet (12.5 mg total) by mouth 2 (two) times daily with a meal.  Patient not taking: Reported on 01/24/2021   [name], MD Not Taking Active   clopidogrel (PLAVIX) 75 MG tablet [id number] Yes TAKE 1 TABLET BY MOUTH EVERY DAY Gollan, Kathlene November, MD Taking Active Self  Dulaglutide (TRULICITY) 4.5 AF/7.9UX SOPN [id number] Yes Inject 4.5 mg as directed once a week. [name], MD Taking Active Self           Med Note Juanetta Snow Nov 07, 2020  7:57 AM) Monday   escitalopram (LEXAPRO) 10 MG tablet [id number] No TAKE 1 TABLET BY MOUTH EVERY DAY  Patient not taking: Reported on 01/24/2021   Einar Pheasant, MD Not Taking Active Self  ezetimibe (ZETIA) 10 MG tablet [id number] Yes TAKE 1 TABLET BY MOUTH EVERY DAY [name], MD Taking Active Self  fluticasone-salmeterol (ADVAIR) 250-50 MCG/ACT AEPB [id number] No Inhale 1 puff into the lungs 2 (two) times daily.  Patient not taking: Reported on 01/24/2021    [name], MD Not Taking Active   furosemide (LASIX) 20 MG tablet [id number] Yes TAKE 1 TABLET (20 MG) BY MOUTH ONCE EVERY OTHER DAY Gollan, Kathlene November, MD Taking Active Self  gabapentin (NEURONTIN) 300 MG capsule [id number] Yes Take 1 capsule (300 mg total) by mouth at bedtime. Kris Hartmann, NP Taking Active   insulin degludec (TRESIBA FLEXTOUCH) 100 UNIT/ML FlexTouch Pen [id number] Yes Inject 25 Units into the skin daily. Sharen Hones, MD Taking Active            Med Note ([place] Jan 24, 2021 10:34 AM) Taking 40 units  Insulin Pen Needle (PEN NEEDLES) 32G X 6 MM MISC [id number] Yes 1 each by Does not apply route 2 (two) times daily. [name], MD Taking Active Self  isosorbide mononitrate (IMDUR) 30 MG 24 hr tablet [id number] Yes TAKE 1 TABLET BY MOUTH EVERY DAY [name], MD Taking Active Self  JARDIANCE 25 MG TABS tablet [id number] Yes TAKE 1 TABLET BY MOUTH DAILY [name], MD Taking Active   nitroGLYCERIN (NITROSTAT) 0.4 MG SL tablet [id number] No Place 1 tablet (0.4 mg total) under the tongue every 5 (five) minutes as needed for chest pain. Maximum of 3 doses. Marrianne Mood D, PA-C Not Taking Active   oxyCODONE-acetaminophen (PERCOCET) 7.5-325 MG tablet [id number] No Take 1 tablet by mouth every 8 (eight) hours as needed for severe pain. Sharen Hones, MD Not Taking Active   sacubitril-valsartan [facility]) 49-51 MG [id number] No Take 1 tablet by mouth 2 (two) times daily. STOP LISINOPRIL.  Patient not taking: Reported on 01/24/2021   [name], MD Not Taking Active   silver sulfADIAZINE (SILVADENE) 1 % cream [id number]  Apply 1 application topically daily. Kris Hartmann, NP  Active   spironolactone (ALDACTONE) 25 MG tablet [id number] Yes Take 1 tablet (25 mg total) by mouth daily. [name], MD Taking Active   tamsulosin [facility]) 0.4 MG CAPS capsule [id number] Yes TAKE 1 CAPSULE BY MOUTH EVERY DAY Einar Pheasant, MD Taking  Active Self  VENTOLIN HFA 108 (90 Base) MCG/ACT inhaler [id number] Yes Inhale 2 puffs into the lungs every 6 (six) hours as needed for wheezing or shortness of breath. [name], MD Taking Active             Patient Active Problem List   Diagnosis Date Noted   Irregular heart beat 01/24/2021   Hx of BKA, left ([place]) 12/16/2020   Reactive thrombocytosis 44/92/0100   Chronic systolic heart failure ([place])    Cellulitis of left foot 11/09/2020   HFrEF (heart failure with reduced ejection fraction) ([place])    Thrush    Sepsis ([place]) 11/06/2020   Gangrene of left foot ([place])    PVD (peripheral vascular disease) ([place])    Stage 2 chronic kidney disease    Atherosclerosis of native arteries of the extremities with gangrene ([place]) 10/28/2020   Low back pain 06/29/2020   Onychomycosis 03/29/2020   Fall 03/29/2020   AKI (acute kidney injury) ([place]) 12/28/2019   Coronary artery disease of native artery of native heart with stable angina pectoris ([place]) 12/12/2018   Pure hypercholesterolemia 12/12/2018   Chest pain 10/31/2018   Atherosclerosis of native arteries of extremity with intermittent claudication ([place]) 02/11/2018   Decreased pedal pulses 07/02/2017   Orthostasis 05/07/2017   Anxiety and depression 05/07/2017   CKD (chronic kidney disease) stage 3, GFR 30-59 ml/min (HCC) 03/06/2017   Diabetic neuropathy (HCC) 03/06/2017   Chronic diastolic CHF (congestive heart failure) ([place]) 03/05/2017   Morbid obesity ([place]) 06/18/2016   H/O medication noncompliance 06/18/2016   COPD with chronic bronchitis ([place]) 06/20/2015   GERD (gastroesophageal reflux disease) 12/20/2014   Type 2 diabetes mellitus with hyperlipidemia ([employer]) 06/05/2010   Hyperlipidemia 02/10/2010   Essential hypertension 02/10/2010   CAD (coronary artery disease) 02/10/2010    Immunization History  Administered Date(s) Administered   Fluad Quad(high Dose 65+) 08/02/2019, 09/30/2020   Influenza, High Dose Seasonal PF  09/01/2018   Moderna Sars-Covid-2 Vaccination 04/07/2020, 05/09/2020   Pneumococcal Polysaccharide-23 08/02/2019   Tdap 11/28/2020    Conditions to  be addressed/monitored: CHF, HTN, HLD, and Depression  Care Plan : Medication Management  Updates made by De Hollingshead, RPH-CPP since 01/30/2021 12:00 AM     Problem: Diabetes, Hypertension, Depression, CHF      Long-Range Goal: Disease Progression Prevention   This Visit's Progress: On track  Recent Progress: On track  Priority: High  Note:   Current Barriers:  Unable to independently monitor therapeutic efficacy Unable to achieve control of diabetes  Low level of healthcare literacy  Pharmacist Clinical Goal(s):  Over the next 90 days, patient will achieve adherence to monitoring guidelines and medication adherence to achieve therapeutic efficacy. Over the next 90 days, patient will improve control of diabetes as evidenced by A1c.   Interventions: 1:1 collaboration with [name], MD regarding development and update of comprehensive plan of care as evidenced by provider attestation and co-signature Inter-disciplinary care team collaboration (see longitudinal plan of care) Comprehensive medication review performed; medication list updated in electronic medical record  Diabetes: CONTROLLED per office A1c today, 6.3%, current treatment: Trulicity 4.5 mg weekly, Jardiance 25 mg daily, Tresiba 40 units daily, metformin XR 500 mg twice daily Current glucose readings: previously using Libre 2 CGM. Plans to restart.  Continue current regimen as previously prescribed.    Hypertension, CAD, CHF (most recent EF 35-40%) Appropriately managed; current treatment: carvedilol 12.5 mg BID, furosemide 20 mg every other day, isosorbide 30 mg daily, Entresto 49/51 mg BID, spironolactone 25 mg daily Reviewed medication bottles. Patient's current supply matches what he should be taking. Reviewed what time of day to administer each  medication. He notes that he will have his son help him fill his pill box later today.   Hyperlipidemia and ASCVD risk reduction Very well controlled; current treatment: atorvastatin 80 mg daily, ezetimibe 10 mg daily- requests refill Current antiplatelet regimen: aspirin 81 mg daily, clopidogrel 75 mg daily Previously recommended to continue current regimenat this time.   Depression/Anxiety: Appropriately well controlled given recent life stressors; escitalopram 10 mg daily Previously recommended to continue current regimen at this time  BPH Appropriately well controlled per patient symptom report; tamsulosin 0.4 mg daily Previously recommended to continue current regimen at this time  Moderate Obstructive Lung Disease: Uncontrolled per patient symptom report; Advair and ran out of albuterol Reviewed maintenance vs as needed regimen. Counseled to take Advair twice daily every day, only use albuterol PRN. Refills sent per patient request  Patient Goals/Self-Care Activities Over the next 90 days, patient will:  - take medications as prescribed check blood glucose at least three times daily using CGM, document, and provide at future appointments collaborate with provider on medication access solutions  Follow Up Plan: Face to Face appointment with care management team member scheduled for:  ~ 2 week      Medication Assistance: None required.  Patient affirms current coverage meets needs.  Patient's preferred pharmacy is:  [facility], [address] [place] [address] Phone: [phone] Fax: [phone]  [id number] [place] [id number]: [phone]: [phone]   Follow Up:  Patient agrees to Care Plan and Follow-up.  Plan: Face to Face appointment with care management team member scheduled for: ~ 2 weeks  Catie TDarnelle Maffucci PharmD, BM[place] CEdieClinical  Pharmacist [place] [phone]

## 2021-02-02 ENCOUNTER — Telehealth: Payer: Self-pay | Admitting: Family Medicine

## 2021-02-02 NOTE — Telephone Encounter (Signed)
   Danny [name]. DOB: 09-18-1950 MRN: [id number]   RIDER WAIVER AND RELEASE OF LIABILITY  For purposes of improving physical access to our facilities, Danny [name] is pleased to partner with third parties to provide Danny [name] patients or other authorized individuals the option of convenient, on-demand ground transportation services (the Chiropractor") through use of the technology service that enables users to request on-demand ground transportation from independent third-party providers.  By opting to use and accept these [employer], I, the undersigned, hereby agree on behalf of myself, and on behalf of any minor child using the Danny [name] for whom I am the parent or legal guardian, as follows:  Danny [name] provided to me are provided by independent third-party transportation providers who are not Danny [name] or employees and who are unaffiliated with Danny [name]. Danny [name] is neither a transportation carrier nor a common or public carrier. Danny [name] has no control over the quality or safety of the transportation that occurs as a result of the [employer]. Danny [name] cannot guarantee that any third-party transportation provider will complete any arranged transportation service. Danny [name] makes no representation, warranty, or guarantee regarding the reliability, timeliness, quality, safety, suitability, or availability of any of the Transport Services or that they will be error free. I fully understand that traveling by vehicle involves risks and dangers of serious bodily injury, including permanent disability, paralysis, and death. I agree, on behalf of myself and on behalf of any minor child using the Transport Services for whom I am the parent or legal guardian, that the entire risk arising out of my use of the [employer] remains solely with me, to the maximum extent permitted under applicable law. The [employer] are provided  "as is" and "as available." [place] disclaims all representations and warranties, express, implied or statutory, not expressly set out in these terms, including the implied warranties of merchantability and fitness for a particular purpose. I hereby waive and release Danny [name], its agents, employees, officers, directors, representatives, insurers, attorneys, assigns, successors, subsidiaries, and affiliates from any and all past, present, or future claims, demands, liabilities, actions, causes of action, or suits of any kind directly or indirectly arising from acceptance and use of the [employer]. I further waive and release Danny [name] and its affiliates from all present and future liability and responsibility for any injury or death to persons or damages to property caused by or related to the use of the [employer]. I have read this Waiver and Release of Liability, and I understand the terms used in it and their legal significance. This Waiver is freely and voluntarily given with the understanding that my right (as well as the right of any minor child for whom I am the parent or legal guardian using the [employer]) to legal recourse against  in connection with the [employer] is knowingly surrendered in return for use of these services.   I attest that I read the consent document to Danny [name]., gave Danny [name] the opportunity to ask questions and answered the questions asked (if any). I affirm that Danny [name]. then provided consent for he's participation in this program.     Danny [name]

## 2021-02-03 ENCOUNTER — Encounter (INDEPENDENT_AMBULATORY_CARE_PROVIDER_SITE_OTHER): Payer: Self-pay | Admitting: Nurse Practitioner

## 2021-02-03 ENCOUNTER — Telehealth: Payer: Self-pay | Admitting: Pharmacist

## 2021-02-03 ENCOUNTER — Encounter (INDEPENDENT_AMBULATORY_CARE_PROVIDER_SITE_OTHER): Payer: Self-pay

## 2021-02-03 ENCOUNTER — Ambulatory Visit (INDEPENDENT_AMBULATORY_CARE_PROVIDER_SITE_OTHER): Payer: 59 | Admitting: Nurse Practitioner

## 2021-02-03 NOTE — Telephone Encounter (Signed)
  Chronic Care Management   Note  02/03/2021 Name: Danny Saiki Reeg Sr. MRN: [id number] DOB: 15-Oct-1950   Patient called me to notify that he was not picked up for his vascular appointment today. Will collaborate with transportation team to determine how to rectify the situation.

## 2021-02-07 ENCOUNTER — Telehealth: Payer: Self-pay | Admitting: Family Medicine

## 2021-02-07 NOTE — Telephone Encounter (Signed)
   Telephone encounter was:  Successful.  02/07/2021 Name: Danny Henner Casares Sr. MRN: [id number] DOB: Nov 28, 1950  Danny Friends Frees Sr. is a 70 y.o. year old male who is a primary care patient of [name], Yehuda Mao, MD . The community resource team was consulted for assistance with Transportation Needs   Care guide performed the following interventions: Discussed resources to assist with Danny [name] the manager for transportation and explained that this patient and another pt were not contacted and missed their appointment. I sent an email letting him know what has happened .  Follow Up Plan:  No further follow up planned at this time. The patient has been provided with needed resources. and the email has been sent and hopefully Danny [name] will reach out to them directly.  [name] Care Guide, Embedded Care Coordination [facility], Care Management Phone: [phone] Email: [email or web address]

## 2021-02-13 ENCOUNTER — Ambulatory Visit: Payer: 59 | Admitting: Family Medicine

## 2021-02-13 ENCOUNTER — Ambulatory Visit: Payer: 59

## 2021-02-14 ENCOUNTER — Ambulatory Visit: Payer: 59 | Admitting: Family Medicine

## 2021-02-14 ENCOUNTER — Ambulatory Visit: Payer: 59

## 2021-02-16 ENCOUNTER — Telehealth: Payer: 59

## 2021-02-17 ENCOUNTER — Telehealth: Payer: Self-pay | Admitting: Pharmacist

## 2021-02-17 DIAGNOSIS — I10 Essential (primary) hypertension: Secondary | ICD-10-CM

## 2021-02-17 DIAGNOSIS — E785 Hyperlipidemia, unspecified: Secondary | ICD-10-CM

## 2021-02-17 DIAGNOSIS — I2510 Atherosclerotic heart disease of native coronary artery without angina pectoris: Secondary | ICD-10-CM

## 2021-02-17 NOTE — Telephone Encounter (Signed)
Placing CCM referral for transportation support for this patient for CCM visit 03/16/21.

## 2021-02-23 ENCOUNTER — Encounter (INDEPENDENT_AMBULATORY_CARE_PROVIDER_SITE_OTHER): Payer: Self-pay | Admitting: Nurse Practitioner

## 2021-02-23 ENCOUNTER — Other Ambulatory Visit: Payer: Self-pay

## 2021-02-23 ENCOUNTER — Telehealth: Payer: Self-pay | Admitting: Family Medicine

## 2021-02-23 ENCOUNTER — Ambulatory Visit (INDEPENDENT_AMBULATORY_CARE_PROVIDER_SITE_OTHER): Payer: 59 | Admitting: Nurse Practitioner

## 2021-02-23 VITALS — BP 94/63 | HR 89 | Ht 72.0 in | Wt 202.0 lb

## 2021-02-23 DIAGNOSIS — Z89512 Acquired absence of left leg below knee: Secondary | ICD-10-CM

## 2021-02-23 DIAGNOSIS — E7800 Pure hypercholesterolemia, unspecified: Secondary | ICD-10-CM

## 2021-02-23 NOTE — Telephone Encounter (Signed)
   Telephone encounter was:  Successful.  02/23/2021 Name: Danny Tsang Penn Sr. MRN: [id number] DOB: Mar 10, 1951  Danny Friends Burnette Sr. is a 70 y.o. year old male who is a primary care patient of [name], Yehuda Mao, MD . The community resource team was consulted for assistance with Transportation Needs   Care guide performed the following interventions: Patient provided with information about care guide support team and interviewed to confirm resource needs. I will contact [employer] and set up transportation to 8/11 appt with Dr. Manson Passey.   Follow Up Plan:  Care guide will follow up with patient by phone over the next week.  April Green Care Guide, Embedded Care Coordination [facility], Care Management Phone: [phone] Email: [email or web address]

## 2021-02-23 NOTE — Progress Notes (Signed)
Subjective:    Patient ID: Danny Scrape Sr., male    DOB: 1950/11/30, 70 y.o.   MRN: [id number] Chief Complaint  Patient presents with   Follow-up    3 week no studies    Patient returns today in follow up of his left below-knee amputation.  He is doing reasonably well.  His pain does seem better controlled.  He still has some neuropathic pain but more so he endorses itching of his amputated foot.  The medial side of the wound has a small area that has not yet healed but it is very shallow.  He denies any fevers or chills or any significant drainage from this wound.   Review of Systems  Skin:  Positive for wound.  Neurological:  Positive for weakness.  All other systems reviewed and are negative.     Objective:   Physical Exam Vitals reviewed.  HENT:     Head: Normocephalic.  Cardiovascular:     Rate and Rhythm: Normal rate.     Pulses: Normal pulses.  Pulmonary:     Effort: Pulmonary effort is normal.  Musculoskeletal:     Left Lower Extremity: Left leg is amputated below knee.  Neurological:     Mental Status: He is alert and oriented to person, place, and time.  Psychiatric:        Mood and Affect: Mood normal.        Behavior: Behavior normal.        Thought Content: Thought content normal.        Judgment: Judgment normal.    BP 94/63   Pulse 89   Ht 6' (1.829 m)   Wt 202 lb (91.6 kg)   BMI 27.40 kg/m   Past Medical History:  Diagnosis Date   CAD (coronary artery disease)    a. 01/2010 PCI of LAD; b. 09/2014 PCI/DES of mLAD due to ISR. D1 80, D2 80(jailed), LCX 71m; c. 07/2016 NSTEMI/Cath: LM nl, LAD 28m ISR, 40d, D1 90ost, 80p, D2 90ost, RI min irregs, LCX min irregs, OM1 90 small, OM2/3 min irregs, RCA min irregs, RPLB1 90, EF 25-35%.   Chronic combined systolic and diastolic CHF (congestive heart failure) (HCC)    a. 07/2016 Echo: EF 30%, severe septal/anterior HK, Gr1 DD.   CKD (chronic kidney disease), stage III (HCC)    COPD (chronic obstructive  pulmonary disease) (HCC)    DM2 (diabetes mellitus, type 2) (HCC)    Erectile dysfunction    HLD (hyperlipidemia)    HTN (hypertension)    Ischemic cardiomyopathy    a. 07/2016 Echo: EF 30% w/ sev septal/ant HK. Gr1 DD.    Social History   Socioeconomic History   Marital status: Single    Spouse name: Not on file   Number of children: Not on file   Years of education: Not on file   Highest education level: Not on file  Occupational History   Not on file  Tobacco Use   Smoking status: Former    Packs/day: 3.00    Years: 0.00    Pack years: 0.00    Types: Cigarettes    Quit date: 09/07/2009    Years since quitting: 11.4   Smokeless tobacco: Never   Tobacco comments:    quit june 2011  Vaping Use   Vaping Use: Never used  Substance and Sexual Activity   Alcohol use: Yes    Comment: every other weekend   Drug use: No   Sexual activity: Not  on file  Other Topics Concern   Not on file  Social History Narrative   Not on file   Social Determinants of Health   Financial Resource Strain: Low Risk    Difficulty of Paying Living Expenses: Not hard at all  Food Insecurity: No Food Insecurity   Worried About Charity fundraiser in the Last Year: Never true   [place] in the Last Year: Never true  Transportation Needs: No Transportation Needs   Lack of Transportation (Medical): No   Lack of Transportation (Non-Medical): No  Physical Activity: Not on file  Stress: No Stress Concern Present   Feeling of Stress : Only a little  Social Connections: Unknown   Frequency of Communication with Friends and Family: Not on file   Frequency of Social Gatherings with Friends and Family: More than three times a week   Attends Religious Services: Not on file   Active Member of Clubs or Organizations: Not on file   Attends Archivist Meetings: Not on file   Marital Status: Not on file  Intimate Partner Violence: Not At Risk   Fear of Current or Ex-Partner: No    Emotionally Abused: No   Physically Abused: No   Sexually Abused: No    Past Surgical History:  Procedure Laterality Date   AMPUTATION Left 11/10/2020   Procedure: AMPUTATION BELOW KNEE;  Surgeon: [name], MD;  Location: [facility];  Service: General;  Laterality: Left;   BELOW KNEE LEG AMPUTATION     CAD: stent to the LAD     CARDIAC CATHETERIZATION  10/01/2014   CARDIAC CATHETERIZATION N/A 07/23/2016   Procedure: Left Heart Cath and Coronary Angiography;  Surgeon: [name], MD;  Location: [facility];  Service: Cardiovascular;  Laterality: N/A;   CORONARY ANGIOPLASTY WITH STENT PLACEMENT  10/01/2014   LOWER EXTREMITY ANGIOGRAPHY Left 10/31/2020   Procedure: LOWER EXTREMITY ANGIOGRAPHY;  Surgeon: [name], MD;  Location: [facility];  Service: Cardiovascular;  Laterality: Left;    Family History  Problem Relation Age of Onset   Heart attack Father        complications   Cancer Brother     No Known Allergies  CBC Latest Ref Rng & Units 12/12/2020 11/28/2020 11/12/2020  WBC 4.0 - 10.5 K/uL 9.9 10.3 13.8(H)  Hemoglobin 13.0 - 17.0 g/dL 13.4 13.3 11.1(L)  Hematocrit 39.0 - 52.0 % 42.4 42.0 33.8(L)  Platelets 150 - 400 K/uL 239 397 486(H)      CMP     Component Value Date/Time   NA 136 01/24/2021 1209   NA 135 11/23/2014 2006   K 3.8 01/24/2021 1209   K 4.0 11/23/2014 2006   CL 101 01/24/2021 1209   CL 100 (L) 11/23/2014 2006   CO2 26 01/24/2021 1209   CO2 24 11/23/2014 2006   GLUCOSE 71 01/24/2021 1209   GLUCOSE 370 (H) 11/23/2014 2006   BUN 22 01/24/2021 1209   BUN 40 (H) 11/23/2014 2006   CREATININE 1.15 01/24/2021 1209   CREATININE 2.16 (H) 11/23/2014 2006   CALCIUM 9.5 01/24/2021 1209   CALCIUM 8.6 (L) 11/23/2014 2006   PROT 6.0 (L) 12/12/2020 1339   PROT 7.3 11/23/2014 2006   ALBUMIN 3.0 (L) 12/12/2020 1339   ALBUMIN 4.0 11/23/2014 2006   AST 18 12/12/2020 1339   AST 18 11/23/2014 2006   ALT 24 12/12/2020 1339   ALT 14 (L)  11/23/2014 2006   ALKPHOS  92 12/12/2020 1339   ALKPHOS 96 11/23/2014 2006   BILITOT 0.5 12/12/2020 1339   BILITOT 0.6 11/23/2014 2006   GFRNONAA >60 12/12/2020 1339   GFRNONAA 31 (L) 11/23/2014 2006   GFRAA 37 (L) 12/24/2019 1608   GFRAA 36 (L) 11/23/2014 2006     No results found.     Assessment & Plan:   1. Hx of BKA, left ([place]) The patient's wound has decreased significantly.  The patient has been utilizing silver Silvadene at the wound and has been working well for him.  We will continue to have him utilize this on his wound daily.  He is also advised to keep the wound clean and dry.  We will have him return to the office in 3 weeks for wound evaluation.  2. Pure hypercholesterolemia Continue statin as ordered and reviewed, no changes at this time    Current Outpatient Medications on File Prior to Visit  Medication Sig Dispense Refill   ACCU-CHEK GUIDE test strip CHECK CBGS 4 TIMES A DAY 300 strip 1   albuterol (PROVENTIL) (2.5 MG/3ML) 0.083% nebulizer solution Take 3 mLs (2.5 mg total) by nebulization every 6 (six) hours as needed for wheezing or shortness of breath. 150 mL 1   aspirin 81 MG tablet Take 81 mg by mouth daily.     atorvastatin (LIPITOR) 80 MG tablet TAKE 1 TABLET BY MOUTH EVERY DAY 90 tablet 3   baclofen (LIORESAL) 10 MG tablet TAKE 1 TABLET BY MOUTH THREE TIMES A DAY AS NEEDED FOR MUSCLE SPASMS 30 tablet 0   blood glucose meter kit and supplies KIT Dispense based on patient and insurance preference. Check CBGs 4 times daily. Dx code E11.9. 1 each 0   carvedilol (COREG) 12.5 MG tablet Take 1 tablet (12.5 mg total) by mouth 2 (two) times daily with a meal. 180 tablet 3   clopidogrel (PLAVIX) 75 MG tablet TAKE 1 TABLET BY MOUTH EVERY DAY 90 tablet 3   Dulaglutide (TRULICITY) 4.5 PR/9.4VO SOPN Inject 4.5 mg as directed once a week. 6 mL 3   escitalopram (LEXAPRO) 10 MG tablet TAKE 1 TABLET BY MOUTH EVERY DAY 90 tablet 1   ezetimibe (ZETIA) 10 MG tablet Take 1  tablet (10 mg total) by mouth daily. 90 tablet 3   fluticasone-salmeterol (ADVAIR) 250-50 MCG/ACT AEPB Inhale 1 puff into the lungs 2 (two) times daily. 3 each 1   furosemide (LASIX) 20 MG tablet TAKE 1 TABLET (20 MG) BY MOUTH ONCE EVERY OTHER DAY 45 tablet 3   gabapentin (NEURONTIN) 300 MG capsule Take 1 capsule (300 mg total) by mouth at bedtime. 30 capsule 3   insulin degludec (TRESIBA FLEXTOUCH) 100 UNIT/ML FlexTouch Pen Inject 25 Units into the skin daily. 45 mL 3   Insulin Pen Needle (PEN NEEDLES) 32G X 6 MM MISC 1 each by Does not apply route 2 (two) times daily. 100 each 11   isosorbide mononitrate (IMDUR) 30 MG 24 hr tablet TAKE 1 TABLET BY MOUTH EVERY DAY 90 tablet 3   JARDIANCE 25 MG TABS tablet TAKE 1 TABLET BY MOUTH DAILY 90 tablet 3   metFORMIN (GLUCOPHAGE-XR) 500 MG 24 hr tablet Take 1 tablet (500 mg total) by mouth 2 (two) times daily with a meal. 180 tablet 1   nitroGLYCERIN (NITROSTAT) 0.4 MG SL tablet Place 1 tablet (0.4 mg total) under the tongue every 5 (five) minutes as needed for chest pain. Maximum of 3 doses. 25 tablet 3   oxyCODONE-acetaminophen (PERCOCET) 7.5-325 MG  tablet Take 1 tablet by mouth every 8 (eight) hours as needed for severe pain. 12 tablet 0   sacubitril-valsartan (ENTRESTO) 49-51 MG Take 1 tablet by mouth 2 (two) times daily. STOP LISINOPRIL. 60 tablet 2   silver sulfADIAZINE (SILVADENE) 1 % cream Apply 1 application topically daily. 50 g 0   spironolactone (ALDACTONE) 25 MG tablet Take 1 tablet (25 mg total) by mouth daily. 90 tablet 1   tamsulosin (FLOMAX) 0.4 MG CAPS capsule TAKE 1 CAPSULE BY MOUTH EVERY DAY 90 capsule 0   VENTOLIN HFA 108 (90 Base) MCG/ACT inhaler Inhale 2 puffs into the lungs every 6 (six) hours as needed for wheezing or shortness of breath. 18 g 3   No current facility-administered medications on file prior to visit.    There are no Patient Instructions on file for this visit. No follow-ups on file.   Kris Hartmann, NP

## 2021-02-24 ENCOUNTER — Telehealth: Payer: Self-pay | Admitting: Family Medicine

## 2021-02-24 NOTE — Telephone Encounter (Signed)
   Telephone encounter was:  Successful.  02/24/2021 Name: Danny Loeza Sledd Sr. MRN: [id number] DOB: Jan 14, 1951  Danny Friends Scalzo Sr. is a 70 y.o. year old male who is a primary care patient of [name], Yehuda Mao, MD . The community resource team was consulted for assistance with Transportation Needs   Care guide performed the following interventions: Discussed resources to assist with transportation. I arranged pick up for his 8/11 appointment with [facility]. He advised he would reach out to the pt today and the pt would get a call on 8/10 to verify the ride. I talked to the pt and advised he needed to answer the phone when they call the day before to verify .  Follow Up Plan:  No further follow up planned at this time. The patient has been provided with needed resources.  April Green Care Guide, Embedded Care Coordination [facility], Care Management Phone: [phone] Email: [email or web address]

## 2021-03-06 ENCOUNTER — Telehealth: Payer: Self-pay | Admitting: Family Medicine

## 2021-03-06 MED ORDER — PEN NEEDLES 32G X 6 MM MISC: 1.0000 | Freq: Two times a day (BID) | 11 refills | Status: DC

## 2021-03-06 NOTE — Telephone Encounter (Signed)
Pt needs a refill on Insulin Pen Needle (PEN NEEDLES) 32G X 6 MM MISC sent to [facility]

## 2021-03-06 NOTE — Addendum Note (Signed)
Addended by: Elise Benne T on: 03/06/2021 02:21 PM   Modules accepted: Orders

## 2021-03-15 ENCOUNTER — Other Ambulatory Visit: Payer: Self-pay | Admitting: Internal Medicine

## 2021-03-16 ENCOUNTER — Ambulatory Visit (INDEPENDENT_AMBULATORY_CARE_PROVIDER_SITE_OTHER): Payer: 59 | Admitting: Nurse Practitioner

## 2021-03-16 ENCOUNTER — Ambulatory Visit: Payer: 59 | Admitting: Pharmacist

## 2021-03-16 DIAGNOSIS — I10 Essential (primary) hypertension: Secondary | ICD-10-CM

## 2021-03-16 DIAGNOSIS — I5032 Chronic diastolic (congestive) heart failure: Secondary | ICD-10-CM

## 2021-03-16 DIAGNOSIS — E1169 Type 2 diabetes mellitus with other specified complication: Secondary | ICD-10-CM

## 2021-03-16 DIAGNOSIS — N183 Chronic kidney disease, stage 3 unspecified: Secondary | ICD-10-CM

## 2021-03-16 DIAGNOSIS — E7800 Pure hypercholesterolemia, unspecified: Secondary | ICD-10-CM

## 2021-03-16 NOTE — Chronic Care Management (AMB) (Signed)
Chronic Care Management Pharmacy Note  03/16/2021 Name:  Danny Shieh Lindamood Sr. MRN:  [id number] DOB:  01-12-1951    Subjective: Danny Friends Selmon Sr. is an 70 y.o. year old male who is a primary patient of [name], Yehuda Mao, MD.  The CCM team was consulted for assistance with disease management and care coordination needs.    Care coordination  for  health access  in response to provider referral for pharmacy case management and/or care coordination services.   Consent to Services:  The patient was given information about Chronic Care Management services, agreed to services, and gave verbal consent prior to initiation of services.  Please see initial visit note for detailed documentation.   Patient Care Team: [name], MD as PCP - General (Family Medicine) [name], MD as PCP - Cardiology (Cardiology) [name], MD as Consulting Physician (Cardiology) Lourena Simmonds, RPH-CPP as Pharmacist (Pharmacist)    Objective:  Lab Results  Component Value Date   CREATININE 1.15 01/24/2021   CREATININE 1.24 12/12/2020   CREATININE 0.98 11/13/2020    Lab Results  Component Value Date   HGBA1C 6.3 (A) 01/24/2021   Last diabetic Eye exam:  Lab Results  Component Value Date/Time   HMDIABEYEEXA No Retinopathy 04/25/2018 12:56 PM    Last diabetic Foot exam: No results found for: HMDIABFOOTEX      Component Value Date/Time   CHOL 96 10/26/2020 1048   TRIG 60 10/26/2020 1048   HDL 46 10/26/2020 1048   CHOLHDL 2.1 10/26/2020 1048   VLDL 12 10/26/2020 1048   LDLCALC 38 10/26/2020 1048   LDLDIRECT 64.0 03/20/2018 0950    Hepatic Function Latest Ref Rng & Units 12/12/2020 11/09/2020 11/06/2020  Total Protein 6.5 - 8.1 g/dL 6.0(L) 6.5 7.0  Albumin 3.5 - 5.0 g/dL 3.0(L) 2.4(L) 3.0(L)  AST 15 - 41 U/L 18 46(H) 74(H)  ALT 0 - 44 U/L 24 60(H) 99(H)  Alk Phosphatase 38 - 126 U/L 92 120 129(H)  Total Bilirubin 0.3 - 1.2 mg/dL 0.5 0.8 0.8  Bilirubin, Direct 0.0 -  0.2 mg/dL - - -    No results found for: TSH, FREET4  CBC Latest Ref Rng & Units 12/12/2020 11/28/2020 11/12/2020  WBC 4.0 - 10.5 K/uL 9.9 10.3 13.8(H)  Hemoglobin 13.0 - 17.0 g/dL 47.8 68.9 11.1(L)  Hematocrit 39.0 - 52.0 % 42.4 42.0 33.8(L)  Platelets 150 - 400 K/uL 239 397 486(H)    No results found for: VD25OH  Clinical ASCVD: Yes  The ASCVD Risk score Denman George DC Jr., et al., 2013) failed to calculate for the following reasons:   The valid total cholesterol range is 130 to 320 mg/dL      Social History   Tobacco Use  Smoking Status Former   Packs/day: 3.00   Years: 0.00   Pack years: 0.00   Types: Cigarettes   Quit date: 09/07/2009   Years since quitting: 11.5  Smokeless Tobacco Never  Tobacco Comments   quit june 2011   BP Readings from Last 3 Encounters:  02/23/21 94/63  01/24/21 (!) 170/90  01/24/21 (!) 149/101   Pulse Readings from Last 3 Encounters:  02/23/21 89  01/24/21 93  01/03/21 89   Wt Readings from Last 3 Encounters:  02/23/21 202 lb (91.6 kg)  01/24/21 202 lb (91.6 kg)  01/03/21 202 lb (91.6 kg)    Assessment: Review of patient past medical history, allergies, medications, health status, including review of consultants reports, laboratory  and other test data, was performed as part of comprehensive evaluation and provision of chronic care management services.   SDOH:  (Social Determinants of Health) assessments and interventions performed:  SDOH Interventions    Flowsheet Row Most Recent Value  SDOH Interventions   Transportation Interventions [facility]       CCM Care Plan  No Known Allergies  Medications Reviewed Today     Reviewed by Kris Hartmann, NP (Nurse Practitioner) on 02/23/21 at 1438  Med List Status: <None>   Medication Order Taking? Sig Documenting Provider Last Dose Status Informant  ACCU-CHEK GUIDE test strip [id number] Yes CHECK CBGS 4 TIMES A DAY [name], MD Taking Active Self  albuterol  (PROVENTIL) (2.5 MG/3ML) 0.083% nebulizer solution [id number] Yes Take 3 mLs (2.5 mg total) by nebulization every 6 (six) hours as needed for wheezing or shortness of breath. [name], MD Taking Active   aspirin 81 MG tablet [id number] Yes Take 81 mg by mouth daily. [provider] Taking Active Self  atorvastatin (LIPITOR) 80 MG tablet [id number] Yes TAKE 1 TABLET BY MOUTH EVERY DAY [name], MD Taking Active   baclofen (LIORESAL) 10 MG tablet [id number] Yes TAKE 1 TABLET BY MOUTH THREE TIMES A DAY AS NEEDED FOR MUSCLE SPASMS [name], MD Taking Active Self           Med Note De Hollingshead   Tue Jan 24, 2021 10:33 AM) Taking about every other day  blood glucose meter kit and supplies KIT [id number] Yes Dispense based on patient and insurance preference. Check CBGs 4 times daily. Dx code E11.9. [name], MD Taking Active Self  carvedilol (COREG) 12.5 MG tablet [id number] Yes Take 1 tablet (12.5 mg total) by mouth 2 (two) times daily with a meal. [name], MD Taking Active   clopidogrel (PLAVIX) 75 MG tablet [id number] Yes TAKE 1 TABLET BY MOUTH EVERY DAY Gollan, Kathlene November, MD Taking Active Self  Dulaglutide (TRULICITY) 4.5 BB/0.4UG SOPN [id number] Yes Inject 4.5 mg as directed once a week. [name], MD Taking Active Self           Med Note Juanetta Snow Nov 07, 2020  7:57 AM) Monday   escitalopram (LEXAPRO) 10 MG tablet [id number] Yes TAKE 1 TABLET BY MOUTH EVERY DAY Einar Pheasant, MD Taking Active   ezetimibe (ZETIA) 10 MG tablet [id number] Yes Take 1 tablet (10 mg total) by mouth daily. [name], MD Taking Active   fluticasone-salmeterol (ADVAIR) 250-50 MCG/ACT AEPB [id number] Yes Inhale 1 puff into the lungs 2 (two) times daily. [name], MD Taking Active   furosemide (LASIX) 20 MG tablet [id number] Yes TAKE 1 TABLET (20 MG) BY MOUTH ONCE EVERY OTHER DAY Gollan, Kathlene November, MD Taking Active Self   gabapentin (NEURONTIN) 300 MG capsule [id number] Yes Take 1 capsule (300 mg total) by mouth at bedtime. Kris Hartmann, NP Taking Active   insulin degludec (TRESIBA FLEXTOUCH) 100 UNIT/ML FlexTouch Pen [id number] Yes Inject 25 Units into the skin daily. Sharen Hones, MD Taking Active            Med Note ([place] Jan 24, 2021 10:34 AM) Taking 40 units   Insulin Pen Needle (PEN NEEDLES) 32G X 6 MM MISC [id number] Yes 1 each by Does not apply route 2 (two) times daily. [name], MD Taking Active Self  isosorbide mononitrate (IMDUR) 30 MG 24  hr tablet [id number] Yes TAKE 1 TABLET BY MOUTH EVERY DAY Rockey Situ Kathlene November, MD Taking Active Self  JARDIANCE 25 MG TABS tablet [id number] Yes TAKE 1 TABLET BY MOUTH DAILY [name], MD Taking Active   metFORMIN (GLUCOPHAGE-XR) 500 MG 24 hr tablet [id number] Yes Take 1 tablet (500 mg total) by mouth 2 (two) times daily with a meal. [name], MD Taking Active   nitroGLYCERIN (NITROSTAT) 0.4 MG SL tablet [id number] Yes Place 1 tablet (0.4 mg total) under the tongue every 5 (five) minutes as needed for chest pain. Maximum of 3 doses. Marrianne Mood D, PA-C Taking Active   oxyCODONE-acetaminophen (PERCOCET) 7.5-325 MG tablet [id number] Yes Take 1 tablet by mouth every 8 (eight) hours as needed for severe pain. Sharen Hones, MD Taking Active   sacubitril-valsartan [facility]) 49-51 MG [id number] Yes Take 1 tablet by mouth 2 (two) times daily. STOP LISINOPRIL. [name], MD Taking Active   silver sulfADIAZINE (SILVADENE) 1 % cream [id number] Yes Apply 1 application topically daily. Kris Hartmann, NP Taking Active   spironolactone (ALDACTONE) 25 MG tablet [id number] Yes Take 1 tablet (25 mg total) by mouth daily. [name], MD Taking Active   tamsulosin [facility]) 0.4 MG CAPS capsule [id number] Yes TAKE 1 CAPSULE BY MOUTH EVERY DAY Einar Pheasant, MD Taking Active Self  VENTOLIN HFA 108 (90 Base) MCG/ACT inhaler  [id number] Yes Inhale 2 puffs into the lungs every 6 (six) hours as needed for wheezing or shortness of breath. [name], MD Taking Active             Patient Active Problem List   Diagnosis Date Noted   Irregular heart beat 01/24/2021   Hx of BKA, left ([place]) 12/16/2020   Reactive thrombocytosis 50/04/3817   Chronic systolic heart failure ([place])    Cellulitis of left foot 11/09/2020   HFrEF (heart failure with reduced ejection fraction) ([place])    Thrush    Sepsis ([place]) 11/06/2020   Gangrene of left foot ([place])    PVD (peripheral vascular disease) ([place])    Stage 2 chronic kidney disease    Atherosclerosis of native arteries of the extremities with gangrene ([place]) 10/28/2020   Low back pain 06/29/2020   Onychomycosis 03/29/2020   Fall 03/29/2020   AKI (acute kidney injury) ([place]) 12/28/2019   Coronary artery disease of native artery of native heart with stable angina pectoris ([place]) 12/12/2018   Pure hypercholesterolemia 12/12/2018   Chest pain 10/31/2018   Atherosclerosis of native arteries of extremity with intermittent claudication ([place]) 02/11/2018   Decreased pedal pulses 07/02/2017   Orthostasis 05/07/2017   Anxiety and depression 05/07/2017   CKD (chronic kidney disease) stage 3, GFR 30-59 ml/min (HCC) 03/06/2017   Diabetic neuropathy (HCC) 03/06/2017   Chronic diastolic CHF (congestive heart failure) ([place]) 03/05/2017   Morbid obesity ([place]) 06/18/2016   H/O medication noncompliance 06/18/2016   COPD with chronic bronchitis ([place]) 06/20/2015   GERD (gastroesophageal reflux disease) 12/20/2014   Type 2 diabetes mellitus with hyperlipidemia ([place]) 06/05/2010   Hyperlipidemia 02/10/2010   Essential hypertension 02/10/2010   CAD (coronary artery disease) 02/10/2010    Immunization History  Administered Date(s) Administered   Fluad Quad(high Dose 65+) 08/02/2019, 09/30/2020   Influenza, High Dose Seasonal PF 09/01/2018   Moderna Sars-Covid-2 Vaccination 04/07/2020,  05/09/2020   Pneumococcal Polysaccharide-23 08/02/2019   Tdap 11/28/2020    Conditions to be addressed/monitored: HTN, HLD, and DMII  Care Plan : Medication Management  Updates made by Darnelle Maffucci,  Arville Lime, RPH-CPP since 03/16/2021 12:00 AM     Problem: Diabetes, Hypertension, Depression, CHF      Long-Range Goal: Disease Progression Prevention   This Visit's Progress: On track  Recent Progress: On track  Priority: High  Note:   Current Barriers:  Unable to independently monitor therapeutic efficacy Unable to achieve control of diabetes  Low level of healthcare literacy  Pharmacist Clinical Goal(s):  Over the next 90 days, patient will achieve adherence to monitoring guidelines and medication adherence to achieve therapeutic efficacy. Over the next 90 days, patient will improve control of diabetes as evidenced by A1c.   Interventions: 1:1 collaboration with [name], MD regarding development and update of comprehensive plan of care as evidenced by provider attestation and co-signature Inter-disciplinary care team collaboration (see longitudinal plan of care) Comprehensive medication review performed; medication list updated in electronic medical record  SDOH: Collaborated with Care Guide to ensure patient has transportation for Vascular visit tomorrow. [facility] will pick him up at 8:30-8:45 am.   Diabetes: Controlled per last A1c; current treatment: Trulicity 4.5 mg weekly, Jardiance 25 mg daily, Tresiba 40 units daily, metformin XR 500 mg twice daily Current glucose readings: previously using Libre 2 CGM. Plans to restart.  Previously recommended to continue current regimen as previously prescribed.    Hypertension, CAD, CHF (most recent EF 35-40%) Appropriately managed; current treatment: carvedilol 12.5 mg BID, furosemide 20 mg every other day, isosorbide 30 mg daily, Entresto 49/51 mg BID, spironolactone 25 mg daily Previously recommended to continue  current regimen  Hyperlipidemia and ASCVD risk reduction Very well controlled; current treatment: atorvastatin 80 mg daily, ezetimibe 10 mg daily- requests refill Current antiplatelet regimen: aspirin 81 mg daily, clopidogrel 75 mg daily Previously recommended to continue current regimenat this time.   Depression/Anxiety: Appropriately well controlled given recent life stressors; escitalopram 10 mg daily Previously recommended to continue current regimen at this time  BPH Appropriately well controlled per patient symptom report; tamsulosin 0.4 mg daily Previously recommended to continue current regimen at this time  Moderate Obstructive Lung Disease: Uncontrolled per patient symptom report; Advair and ran out of albuterol Previously recommended to continue current regimen at this time  Patient Goals/Self-Care Activities Over the next 90 days, patient will:  - take medications as prescribed check blood glucose at least three times daily using CGM, document, and provide at future appointments collaborate with provider on medication access solutions  Follow Up Plan: Telephone follow up appointment with care management team member scheduled for: ~ 3 weeks      Medication Assistance: None required.  Patient affirms current coverage meets needs.  Patient's preferred pharmacy is:  [facility], [address] [place] [address] Phone: [phone] Fax: [phone]  [id number] [place], [place] - [address] [place] [address] Phone: [phone] Fax: [phone]    Follow Up:  Patient agrees to Care Plan and Follow-up.  Plan: Telephone follow up appointment with care management team member scheduled for:  ~3 weeks  Catie Darnelle Maffucci, PharmD, [place], [place] Clinical Pharmacist [employer] [facility] [employer] [phone]

## 2021-03-16 NOTE — Patient Instructions (Signed)
Visit Information  PATIENT GOALS: Goals Addressed              This Visit's Progress     Patient Stated   .  Medication Monitoring (pt-stated)        Patient Goals/Self-Care Activities . Over the next 90 days, patient will:  - take medications as prescribed check blood glucose at least three times daily using CGM, document, and provide at future appointments collaborate with provider on medication access solutions        Patient verbalizes understanding of instructions provided today and agrees to view in MyChart.   Plan: Telephone follow up appointment with care management team member scheduled for:  ~ 3 weeks  Catie Regina Ganci, PharmD, BCACP, CPP Clinical Pharmacist  [facility] [place] [phone]  

## 2021-03-17 ENCOUNTER — Encounter (INDEPENDENT_AMBULATORY_CARE_PROVIDER_SITE_OTHER): Payer: Self-pay | Admitting: Nurse Practitioner

## 2021-03-17 ENCOUNTER — Ambulatory Visit (INDEPENDENT_AMBULATORY_CARE_PROVIDER_SITE_OTHER): Payer: 59 | Admitting: Nurse Practitioner

## 2021-03-17 ENCOUNTER — Other Ambulatory Visit: Payer: Self-pay

## 2021-03-17 VITALS — BP 124/89 | HR 98 | Resp 16

## 2021-03-17 DIAGNOSIS — E1169 Type 2 diabetes mellitus with other specified complication: Secondary | ICD-10-CM

## 2021-03-17 DIAGNOSIS — E7800 Pure hypercholesterolemia, unspecified: Secondary | ICD-10-CM

## 2021-03-17 DIAGNOSIS — E785 Hyperlipidemia, unspecified: Secondary | ICD-10-CM

## 2021-03-17 DIAGNOSIS — Z89512 Acquired absence of left leg below knee: Secondary | ICD-10-CM

## 2021-03-18 ENCOUNTER — Encounter (INDEPENDENT_AMBULATORY_CARE_PROVIDER_SITE_OTHER): Payer: Self-pay | Admitting: Nurse Practitioner

## 2021-03-18 NOTE — Progress Notes (Signed)
Subjective:    Patient ID: Danny Organ Sr., male    DOB: 1951-05-23, 70 y.o.   MRN: [id number] Chief Complaint  Patient presents with  . Wound Check    3 wk wound check    Danny [name] is a 70 year old male that returns today in follow up of his left below-knee amputation.  He is doing reasonably well.  His pain does seem better controlled.  He still has some neuropathic pain but more so he endorses itching of his amputated foot.  The medial side of the wound has a very superficial wound.  The anterior portion of the wound is scabbed over but there is no pain or issues.  Review of Systems     Objective:   Physical Exam  BP 124/89 (BP Location: Right Arm)   Pulse 98   Resp 16   Past Medical History:  Diagnosis Date  . CAD (coronary artery disease)    a. 01/2010 PCI of LAD; b. 09/2014 PCI/DES of mLAD due to ISR. D1 80, D2 80(jailed), LCX 68m c. 07/2016 NSTEMI/Cath: LM nl, LAD 233mSR, 40d, D1 90ost, 80p, D2 90ost, RI min irregs, LCX min irregs, OM1 90 small, OM2/3 min irregs, RCA min irregs, RPLB1 90, EF 25-35%.  . Chronic combined systolic and diastolic CHF (congestive heart failure) ([facility]   a. 07/2016 Echo: EF 30%, severe septal/anterior HK, Gr1 DD.  . Marland KitchenKD (chronic kidney disease), stage III ([place]  . COPD (chronic obstructive pulmonary disease) ([facility]  . DM2 (diabetes mellitus, type 2) ([place]  . Erectile dysfunction   . HLD (hyperlipidemia)   . HTN (hypertension)   . Ischemic cardiomyopathy    a. 07/2016 Echo: EF 30% w/ sev septal/ant HK. Gr1 DD.    Social History   Socioeconomic History  . Marital status: Single    Spouse name: Not on file  . Number of children: Not on file  . Years of education: Not on file  . Highest education level: Not on file  Occupational History  . Not on file  Tobacco Use  . Smoking status: Former    Packs/day: 3.00    Years: 0.00    Pack years: 0.00    Types: Cigarettes    Quit date: 09/07/2009    Years since quitting: 11.5  .  Smokeless tobacco: Never  . Tobacco comments:    quit june 2011  Vaping Use  . Vaping Use: Never used  Substance and Sexual Activity  . Alcohol use: Yes    Comment: every other weekend  . Drug use: No  . Sexual activity: Not on file  Other Topics Concern  . Not on file  Social History Narrative  . Not on file   Social Determinants of Health   Financial Resource Strain: Low Risk   . Difficulty of Paying Living Expenses: Not hard at all  Food Insecurity: No Food Insecurity  . Worried About RuCharity fundraisern the Last Year: Never true  . Ran Out of Food in the Last Year: Never true  Transportation Needs: Unmet Transportation Needs  . Lack of Transportation (Medical): Yes  . Lack of Transportation (Non-Medical): Yes  Physical Activity: Not on file  Stress: No Stress Concern Present  . Feeling of Stress : Only a little  Social Connections: Unknown  . Frequency of Communication with Friends and Family: Not on file  . Frequency of Social Gatherings with Friends and Family: More than three times a week  .  Attends Religious Services: Not on file  . Active Member of Clubs or Organizations: Not on file  . Attends Archivist Meetings: Not on file  . Marital Status: Not on file  Intimate Partner Violence: Not At Risk  . Fear of Current or Ex-Partner: No  . Emotionally Abused: No  . Physically Abused: No  . Sexually Abused: No    Past Surgical History:  Procedure Laterality Date  . AMPUTATION Left 11/10/2020   Procedure: AMPUTATION BELOW KNEE;  Surgeon: [name], MD;  Location: [facility] ORS;  Service: General;  Laterality: Left;  . BELOW KNEE LEG AMPUTATION    . CAD: stent to the LAD    . CARDIAC CATHETERIZATION  10/01/2014  . CARDIAC CATHETERIZATION N/A 07/23/2016   Procedure: Left Heart Cath and Coronary Angiography;  Surgeon: [name], MD;  Location: [facility];  Service: Cardiovascular;  Laterality: N/A;  . CORONARY ANGIOPLASTY WITH STENT  PLACEMENT  10/01/2014  . LOWER EXTREMITY ANGIOGRAPHY Left 10/31/2020   Procedure: LOWER EXTREMITY ANGIOGRAPHY;  Surgeon: [name], MD;  Location: [facility];  Service: Cardiovascular;  Laterality: Left;    Family History  Problem Relation Age of Onset  . Heart attack Father        complications  . Cancer Brother     No Known Allergies  CBC Latest Ref Rng & Units 12/12/2020 11/28/2020 11/12/2020  WBC 4.0 - 10.5 K/uL 9.9 10.3 13.8(H)  Hemoglobin 13.0 - 17.0 g/dL 13.4 13.3 11.1(L)  Hematocrit 39.0 - 52.0 % 42.4 42.0 33.8(L)  Platelets 150 - 400 K/uL 239 397 486(H)      CMP     Component Value Date/Time   NA 136 01/24/2021 1209   NA 135 11/23/2014 2006   K 3.8 01/24/2021 1209   K 4.0 11/23/2014 2006   CL 101 01/24/2021 1209   CL 100 (L) 11/23/2014 2006   CO2 26 01/24/2021 1209   CO2 24 11/23/2014 2006   GLUCOSE 71 01/24/2021 1209   GLUCOSE 370 (H) 11/23/2014 2006   BUN 22 01/24/2021 1209   BUN 40 (H) 11/23/2014 2006   CREATININE 1.15 01/24/2021 1209   CREATININE 2.16 (H) 11/23/2014 2006   CALCIUM 9.5 01/24/2021 1209   CALCIUM 8.6 (L) 11/23/2014 2006   PROT 6.0 (L) 12/12/2020 1339   PROT 7.3 11/23/2014 2006   ALBUMIN 3.0 (L) 12/12/2020 1339   ALBUMIN 4.0 11/23/2014 2006   AST 18 12/12/2020 1339   AST 18 11/23/2014 2006   ALT 24 12/12/2020 1339   ALT 14 (L) 11/23/2014 2006   ALKPHOS 92 12/12/2020 1339   ALKPHOS 96 11/23/2014 2006   BILITOT 0.5 12/12/2020 1339   BILITOT 0.6 11/23/2014 2006   GFRNONAA >60 12/12/2020 1339   GFRNONAA 31 (L) 11/23/2014 2006   GFRAA 37 (L) 12/24/2019 1608   GFRAA 36 (L) 11/23/2014 2006     No results found.     Assessment & Plan:   1. Hx of BKA, left [facility]) Danny [name] was seen for further evaluation for left below knee prosthesis.He Is a left below-knee amputee who had amputation on 11/10/20.  At the time he is well healed and ready for fitting of new left below knee prosthesis.  He is a highly motivated individual and  should do well once fitted with prosthesis.  He has no problem returning to a K3 ambulator, which will allow him to walk inside and outside of his home and overload level barriers.  2. Pure hypercholesterolemia Continue statin as ordered and reviewed, no changes at this time   3. Type 2 diabetes mellitus with hyperlipidemia (HCC) Continue hypoglycemic medications as already ordered, these medications have been reviewed and there are no changes at this time.  Hgb A1C to be monitored as already arranged by primary service     Current Outpatient Medications on File Prior to Visit  Medication Sig Dispense Refill  . ACCU-CHEK GUIDE test strip CHECK CBGS 4 TIMES A DAY 300 strip 1  . albuterol (PROVENTIL) (2.5 MG/3ML) 0.083% nebulizer solution Take 3 mLs (2.5 mg total) by nebulization every 6 (six) hours as needed for wheezing or shortness of breath. 150 mL 1  . aspirin 81 MG tablet Take 81 mg by mouth daily.    [name] atorvastatin (LIPITOR) 80 MG tablet TAKE 1 TABLET BY MOUTH EVERY DAY 90 tablet 3  . baclofen (LIORESAL) 10 MG tablet TAKE 1 TABLET BY MOUTH THREE TIMES A DAY AS NEEDED FOR MUSCLE SPASMS 30 tablet 0  . blood glucose meter kit and supplies KIT Dispense based on patient and insurance preference. Check CBGs 4 times daily. Dx code E11.9. 1 each 0  . carvedilol (COREG) 12.5 MG tablet Take 1 tablet (12.5 mg total) by mouth 2 (two) times daily with a meal. 180 tablet 3  . clopidogrel (PLAVIX) 75 MG tablet TAKE 1 TABLET BY MOUTH EVERY DAY 90 tablet 3  . Dulaglutide (TRULICITY) 4.5 IP/3.8SN SOPN Inject 4.5 mg as directed once a week. 6 mL 3  . escitalopram (LEXAPRO) 10 MG tablet TAKE 1 TABLET BY MOUTH EVERY DAY 90 tablet 1  . ezetimibe (ZETIA) 10 MG tablet Take 1 tablet (10 mg total) by mouth daily. 90 tablet 3  . fluticasone-salmeterol (ADVAIR) 250-50 MCG/ACT AEPB Inhale 1 puff into the lungs 2 (two) times daily. 3 each 1  . furosemide (LASIX) 20 MG tablet TAKE 1 TABLET (20 MG) BY MOUTH ONCE  EVERY OTHER DAY 45 tablet 3  . gabapentin (NEURONTIN) 300 MG capsule Take 1 capsule (300 mg total) by mouth at bedtime. 30 capsule 3  . insulin degludec (TRESIBA FLEXTOUCH) 100 UNIT/ML FlexTouch Pen Inject 25 Units into the skin daily. 45 mL 3  . Insulin Pen Needle (PEN NEEDLES) 32G X 6 MM MISC 1 each by Does not apply route 2 (two) times daily. 100 each 11  . isosorbide mononitrate (IMDUR) 30 MG 24 hr tablet TAKE 1 TABLET BY MOUTH EVERY DAY 90 tablet 3  . JARDIANCE 25 MG TABS tablet TAKE 1 TABLET BY MOUTH DAILY 90 tablet 3  . metFORMIN (GLUCOPHAGE-XR) 500 MG 24 hr tablet Take 1 tablet (500 mg total) by mouth 2 (two) times daily with a meal. 180 tablet 1  . nitroGLYCERIN (NITROSTAT) 0.4 MG SL tablet Place 1 tablet (0.4 mg total) under the tongue every 5 (five) minutes as needed for chest pain. Maximum of 3 doses. 25 tablet 3  . oxyCODONE-acetaminophen (PERCOCET) 7.5-325 MG tablet Take 1 tablet by mouth every 8 (eight) hours as needed for severe pain. 12 tablet 0  . sacubitril-valsartan (ENTRESTO) 49-51 MG Take 1 tablet by mouth 2 (two) times daily. STOP LISINOPRIL. 60 tablet 2  . silver sulfADIAZINE (SILVADENE) 1 % cream Apply 1 application topically daily. 50 g 0  . spironolactone (ALDACTONE) 25 MG tablet Take 1 tablet (25 mg total) by mouth daily. 90 tablet 1  . tamsulosin (FLOMAX) 0.4 MG CAPS capsule TAKE 1 CAPSULE BY MOUTH EVERY DAY 90 capsule 0  .  VENTOLIN HFA 108 (90 Base) MCG/ACT inhaler Inhale 2 puffs into the lungs every 6 (six) hours as needed for wheezing or shortness of breath. 18 g 3   No current facility-administered medications on file prior to visit.    There are no Patient Instructions on file for this visit. No follow-ups on file.   Kris Hartmann, NP

## 2021-03-29 ENCOUNTER — Ambulatory Visit (INDEPENDENT_AMBULATORY_CARE_PROVIDER_SITE_OTHER): Payer: 59 | Admitting: Pharmacist

## 2021-03-29 ENCOUNTER — Telehealth: Payer: Self-pay | Admitting: Pharmacist

## 2021-03-29 DIAGNOSIS — I10 Essential (primary) hypertension: Secondary | ICD-10-CM | POA: Diagnosis not present

## 2021-03-29 DIAGNOSIS — E7800 Pure hypercholesterolemia, unspecified: Secondary | ICD-10-CM | POA: Diagnosis not present

## 2021-03-29 DIAGNOSIS — I5032 Chronic diastolic (congestive) heart failure: Secondary | ICD-10-CM | POA: Diagnosis not present

## 2021-03-29 DIAGNOSIS — E118 Type 2 diabetes mellitus with unspecified complications: Secondary | ICD-10-CM

## 2021-03-29 DIAGNOSIS — N183 Chronic kidney disease, stage 3 unspecified: Secondary | ICD-10-CM | POA: Diagnosis not present

## 2021-03-29 DIAGNOSIS — IMO0002 Reserved for concepts with insufficient information to code with codable children: Secondary | ICD-10-CM

## 2021-03-29 DIAGNOSIS — E1165 Type 2 diabetes mellitus with hyperglycemia: Secondary | ICD-10-CM

## 2021-03-29 DIAGNOSIS — E785 Hyperlipidemia, unspecified: Secondary | ICD-10-CM

## 2021-03-29 DIAGNOSIS — E1169 Type 2 diabetes mellitus with other specified complication: Secondary | ICD-10-CM

## 2021-03-29 MED ORDER — TRESIBA FLEXTOUCH 100 UNIT/ML ~~LOC~~ SOPN
40.0000 [IU] | PEN_INJECTOR | Freq: Every day | SUBCUTANEOUS | 3 refills | Status: DC
Start: 2021-03-29 — End: 2021-10-10

## 2021-03-29 NOTE — Patient Instructions (Signed)
Mr. Conran,   It was great talking to you today!  This is how you should Fill your pill box:   Morning: - Isosorbide 30 mg (heart) - Furosemide 20 mg (fluid pill) - EVERY OTHER DAY - Carvedilol 12.5 mg (blood pressure/heart) - Entresto 49/51 mg (blood pressure/heart) - Spironolactone 25 mg (blood pressure/heart) - Aspirin 81 mg (stroke prevention) - Clopidogrel 75 mg (stent protection) - Metformin XR 500 mg (diabetes) - Jardiance 25 mg (diabetes)   Evening: - Carvedilol 12.5 mg (blood pressure/heart) - Entresto 49/51 mg (blood pressure/heart) - Metformin XR 500 mg (diabetes) - Atorvastatin 80 mg (cholesterol) - Ezetimibe 10 mg (cholesterol) - Escitalopram 10 mg (mood)  - Gabapentin 300 mg (pain) - Tamsulosin 0.4 mg (prostate)  Advair inhaler - 1 puff twice daily Tresiba 40 units daily  Trulicity 4.5 mg once weekly  Call me if you have any questions or concerns!  Catie Feliz Beam, PharmD [phone]  Visit Information  PATIENT GOALS:  Goals Addressed               This Visit's Progress     Patient Stated     Medication Monitoring (pt-stated)        Patient Goals/Self-Care Activities Over the next 90 days, patient will:  - take medications as prescribed check blood glucose at least three times daily using CGM, document, and provide at future appointments collaborate with provider on medication access solutions.        The patient verbalized understanding of instructions, educational materials, and care plan provided today and agreed to receive a mailed copy of patient instructions, educational materials, and care plan.   Plan: Face to Face appointment with care management team member scheduled for: 6 weeks   Catie Feliz Beam, PharmD, [place], CPP Clinical Pharmacist [employer] [employer] [phone]

## 2021-03-29 NOTE — Chronic Care Management (AMB) (Signed)
  Chronic Care Management Pharmacy Note  03/29/2021 Name:  Danny M Minor Sr. MRN:  [id number] DOB:  11/05/1950   Subjective: Danny M Mance Sr. is an 70 y.o. year old male who is a primary patient of Sonnenberg, Eric G, MD.  The CCM team was consulted for assistance with disease management and care coordination needs.    Engaged with patient by telephone for follow up visit in response to provider referral for pharmacy case management and/or care coordination services.   Consent to Services:  The patient was given information about Chronic Care Management services, agreed to services, and gave verbal consent prior to initiation of services.  Please see initial visit note for detailed documentation.   Patient Care Team: Sonnenberg, Eric G, MD as PCP - General (Family Medicine) Gollan, Timothy J, MD as PCP - Cardiology (Cardiology) Gollan, Timothy J, MD as Consulting Physician (Cardiology) ,  E, RPH-CPP as Pharmacist (Pharmacist)   Objective:  Lab Results  Component Value Date   CREATININE 1.15 01/24/2021   CREATININE 1.24 12/12/2020   CREATININE 0.98 11/13/2020    Lab Results  Component Value Date   HGBA1C 6.3 (A) 01/24/2021   Last diabetic Eye exam:  Lab Results  Component Value Date/Time   HMDIABEYEEXA No Retinopathy 04/25/2018 12:56 PM    Last diabetic Foot exam: No results found for: HMDIABFOOTEX      Component Value Date/Time   CHOL 96 10/26/2020 1048   TRIG 60 10/26/2020 1048   HDL 46 10/26/2020 1048   CHOLHDL 2.1 10/26/2020 1048   VLDL 12 10/26/2020 1048   LDLCALC 38 10/26/2020 1048   LDLDIRECT 64.0 03/20/2018 0950    Hepatic Function Latest Ref Rng & Units 12/12/2020 11/09/2020 11/06/2020  Total Protein 6.5 - 8.1 g/dL 6.0(L) 6.5 7.0  Albumin 3.5 - 5.0 g/dL 3.0(L) 2.4(L) 3.0(L)  AST 15 - 41 U/L 18 46(H) 74(H)  ALT 0 - 44 U/L 24 60(H) 99(H)  Alk Phosphatase 38 - 126 U/L 92 120 129(H)  Total Bilirubin 0.3 - 1.2 mg/dL 0.5 0.8 0.8  Bilirubin,  Direct 0.0 - 0.2 mg/dL - - -    No results found for: TSH, FREET4  CBC Latest Ref Rng & Units 12/12/2020 11/28/2020 11/12/2020  WBC 4.0 - 10.5 K/uL 9.9 10.3 13.8(H)  Hemoglobin 13.0 - 17.0 g/dL 13.4 13.3 11.1(L)  Hematocrit 39.0 - 52.0 % 42.4 42.0 33.8(L)  Platelets 150 - 400 K/uL 239 397 486(H)    No results found for: VD25OH  Clinical ASCVD: Yes  The ASCVD Risk score (Goff DC Jr., et al., 2013) failed to calculate for the following reasons:   The valid total cholesterol range is 130 to 320 mg/dL     Social History   Tobacco Use  Smoking Status Former   Packs/day: 3.00   Years: 0.00   Pack years: 0.00   Types: Cigarettes   Quit date: 09/07/2009   Years since quitting: 11.5  Smokeless Tobacco Never  Tobacco Comments   quit june 2011   BP Readings from Last 3 Encounters:  03/17/21 124/89  02/23/21 94/63  01/24/21 (!) 170/90   Pulse Readings from Last 3 Encounters:  03/17/21 98  02/23/21 89  01/24/21 93   Wt Readings from Last 3 Encounters:  02/23/21 202 lb (91.6 kg)  01/24/21 202 lb (91.6 kg)  01/03/21 202 lb (91.6 kg)    Assessment: Review of patient past medical history, allergies, medications, health status, including review of consultants reports, laboratory and other test   data, was performed as part of comprehensive evaluation and provision of chronic care management services.   SDOH:  (Social Determinants of Health) assessments and interventions performed:  SDOH Interventions    Flowsheet Row Most Recent Value  SDOH Interventions   Financial Strain Interventions Intervention Not Indicated       CCM Care Plan  No Known Allergies  Medications Reviewed Today     Reviewed by ,  E, RPH-CPP (Pharmacist) on 03/29/21 at 1505  Med List Status: <None>   Medication Order Taking? Sig Documenting Provider Last Dose Status Informant  ACCU-CHEK GUIDE test strip [id number]  CHECK CBGS 4 TIMES A DAY Sonnenberg, Eric G, MD  Active Self  albuterol  (PROVENTIL) (2.5 MG/3ML) 0.083% nebulizer solution [id number]  Take 3 mLs (2.5 mg total) by nebulization every 6 (six) hours as needed for wheezing or shortness of breath. Sonnenberg, Eric G, MD  Active   aspirin 81 MG tablet [id number] Yes Take 81 mg by mouth daily. [provider] Taking Active Self  atorvastatin (LIPITOR) 80 MG tablet [id number] Yes TAKE 1 TABLET BY MOUTH EVERY DAY Sonnenberg, Eric G, MD Taking Active   baclofen (LIORESAL) 10 MG tablet [id number] Yes TAKE 1 TABLET BY MOUTH THREE TIMES A DAY AS NEEDED FOR MUSCLE SPASMS Sonnenberg, Eric G, MD Taking Active Self           Med Note (,  E   Tue Jan 24, 2021 10:33 AM) Taking about every other day  blood glucose meter kit and supplies KIT [id number] Yes Dispense based on patient and insurance preference. Check CBGs 4 times daily. Dx code E11.9. Sonnenberg, Eric G, MD Taking Active Self  carvedilol (COREG) 12.5 MG tablet [id number] Yes Take 1 tablet (12.5 mg total) by mouth 2 (two) times daily with a meal. Sonnenberg, Eric G, MD Taking Active   clopidogrel (PLAVIX) 75 MG tablet [id number] Yes TAKE 1 TABLET BY MOUTH EVERY DAY Gollan, Timothy J, MD Taking Active Self  Dulaglutide (TRULICITY) 4.5 MG/0.5ML SOPN [id number] Yes Inject 4.5 mg as directed once a week. Sonnenberg, Eric G, MD Taking Active Self           Med Note (HAWKINS, CHRISTINA   Mon Nov 07, 2020  7:57 AM) Monday   escitalopram (LEXAPRO) 10 MG tablet [id number] Yes TAKE 1 TABLET BY MOUTH EVERY DAY Sonnenberg, Eric G, MD Taking Active   ezetimibe (ZETIA) 10 MG tablet [id number] Yes Take 1 tablet (10 mg total) by mouth daily. Sonnenberg, Eric G, MD Taking Active   fluticasone-salmeterol (ADVAIR) 250-50 MCG/ACT AEPB [id number] Yes Inhale 1 puff into the lungs 2 (two) times daily. Sonnenberg, Eric G, MD Taking Active   furosemide (LASIX) 20 MG tablet [id number] Yes TAKE 1 TABLET (20 MG) BY MOUTH ONCE EVERY OTHER DAY Gollan, Timothy J, MD Taking Active Self  gabapentin  (NEURONTIN) 300 MG capsule [id number] Yes Take 1 capsule (300 mg total) by mouth at bedtime. Brown, Fallon E, NP Taking Active   insulin degludec (TRESIBA FLEXTOUCH) 100 UNIT/ML FlexTouch Pen [id number] Yes Inject 25 Units into the skin daily. Zhang, Dekui, MD Taking Active            Med Note (,  E   Tue Jan 24, 2021 10:34 AM) Taking 40 units   Insulin Pen Needle (PEN NEEDLES) 32G X 6 MM MISC [id number] Yes 1 each by Does not apply route 2 (two) times daily. Sonnenberg, Eric G, MD Taking Active   isosorbide mononitrate (IMDUR) 30 MG 24 hr tablet   [id number] Yes TAKE 1 TABLET BY MOUTH EVERY DAY Gollan, Timothy J, MD Taking Active Self  JARDIANCE 25 MG TABS tablet [id number] Yes TAKE 1 TABLET BY MOUTH DAILY Sonnenberg, Eric G, MD Taking Active   metFORMIN (GLUCOPHAGE-XR) 500 MG 24 hr tablet [id number] Yes Take 1 tablet (500 mg total) by mouth 2 (two) times daily with a meal. Sonnenberg, Eric G, MD Taking Active   nitroGLYCERIN (NITROSTAT) 0.4 MG SL tablet [id number] No Place 1 tablet (0.4 mg total) under the tongue every 5 (five) minutes as needed for chest pain. Maximum of 3 doses.  Patient not taking: Reported on 03/29/2021   Visser, Jacquelyn D, PA-C Not Taking Active   oxyCODONE-acetaminophen (PERCOCET) 7.5-325 MG tablet [id number] Yes Take 1 tablet by mouth every 8 (eight) hours as needed for severe pain. Zhang, Dekui, MD Taking Active   sacubitril-valsartan (ENTRESTO) 49-51 MG [id number] Yes Take 1 tablet by mouth 2 (two) times daily. STOP LISINOPRIL. Sonnenberg, Eric G, MD Taking Active   silver sulfADIAZINE (SILVADENE) 1 % cream [id number] No Apply 1 application topically daily.  Patient not taking: Reported on 03/29/2021   Brown, Fallon E, NP Not Taking Active   spironolactone (ALDACTONE) 25 MG tablet [id number] Yes Take 1 tablet (25 mg total) by mouth daily. Sonnenberg, Eric G, MD Taking Active   tamsulosin (FLOMAX) 0.4 MG CAPS capsule [id number] Yes TAKE 1 CAPSULE BY MOUTH EVERY DAY Scott,  Charlene, MD Taking Active Self  VENTOLIN HFA 108 (90 Base) MCG/ACT inhaler [id number] Yes Inhale 2 puffs into the lungs every 6 (six) hours as needed for wheezing or shortness of breath. Sonnenberg, Eric G, MD Taking Active             Patient Active Problem List   Diagnosis Date Noted   Irregular heart beat 01/24/2021   Hx of BKA, left (HCC) 12/16/2020   Reactive thrombocytosis 11/14/2020   Chronic systolic heart failure (HCC)    Cellulitis of left foot 11/09/2020   HFrEF (heart failure with reduced ejection fraction) (HCC)    Thrush    Sepsis (HCC) 11/06/2020   Gangrene of left foot (HCC)    PVD (peripheral vascular disease) (HCC)    Stage 2 chronic kidney disease    Atherosclerosis of native arteries of the extremities with gangrene (HCC) 10/28/2020   Low back pain 06/29/2020   Onychomycosis 03/29/2020   Fall 03/29/2020   AKI (acute kidney injury) (HCC) 12/28/2019   Coronary artery disease of native artery of native heart with stable angina pectoris (HCC) 12/12/2018   Pure hypercholesterolemia 12/12/2018   Chest pain 10/31/2018   Atherosclerosis of native arteries of extremity with intermittent claudication (HCC) 02/11/2018   Decreased pedal pulses 07/02/2017   Orthostasis 05/07/2017   Anxiety and depression 05/07/2017   CKD (chronic kidney disease) stage 3, GFR 30-59 ml/min (HCC) 03/06/2017   Diabetic neuropathy (HCC) 03/06/2017   Chronic diastolic CHF (congestive heart failure) (HCC) 03/05/2017   Morbid obesity (HCC) 06/18/2016   H/O medication noncompliance 06/18/2016   COPD with chronic bronchitis (HCC) 06/20/2015   GERD (gastroesophageal reflux disease) 12/20/2014   Type 2 diabetes mellitus with hyperlipidemia (HCC) 06/05/2010   Hyperlipidemia 02/10/2010   Essential hypertension 02/10/2010   CAD (coronary artery disease) 02/10/2010    Immunization History  Administered Date(s) Administered   Fluad Quad(high Dose 65+) 08/02/2019, 09/30/2020   Influenza, High  Dose Seasonal PF 09/01/2018   Moderna Sars-Covid-2 Vaccination 04/07/2020, 05/09/2020   Pneumococcal Polysaccharide-23 08/02/2019   Tdap 11/28/2020    Conditions   to be addressed/monitored: CHF, CAD, HTN, and DMII  Care Plan : Medication Management  Updates made by ,  E, RPH-CPP since 03/29/2021 12:00 AM     Problem: Diabetes, Hypertension, Depression, CHF      Long-Range Goal: Disease Progression Prevention   This Visit's Progress: On track  Recent Progress: On track  Priority: High  Note:   Current Barriers:  Unable to independently monitor therapeutic efficacy Unable to achieve control of diabetes  Low level of healthcare literacy  Pharmacist Clinical Goal(s):  Over the next 90 days, patient will achieve adherence to monitoring guidelines and medication adherence to achieve therapeutic efficacy. Over the next 90 days, patient will improve control of diabetes as evidenced by A1c.   Interventions: 1:1 collaboration with Sonnenberg, Eric G, MD regarding development and update of comprehensive plan of care as evidenced by provider attestation and co-signature Inter-disciplinary care team collaboration (see longitudinal plan of care) Comprehensive medication review performed; medication list updated in electronic medical record  SDOH: Patient plans to contact transportation for upcoming appointments.   Acute Needs: Patient reports he "caught that virus", has not tested, but was at the beach 2 weeks ago with family and a grandchild got sick. He notes sneezing and fatigue up until 3 days ago when he started having diarrhea. Reports everything that he eats "runs right through me", and he's often unable to get to the bathroom in time due to mobility issues so is using a bucket. Has been taking OTC pepto bismol with some benefit. Encouraged focus on hydration. Advised to hold Jardiance while at risk of dehydration. Denies fever, chills. Has not been checking blood sugars  or blood pressures because he feels poorly, but denies any symptoms of hypoglycemia (dizziness, shakiness, sweating) hypotension (lightheadedness, dizziness, feeling of falling).   Diabetes: Controlled per last A1c; current treatment: Trulicity 4.5 mg weekly, Jardiance 25 mg daily, Tresiba 40 units daily, metformin XR 500 mg twice daily Current glucose readings: previously using Libre 2 CGM. Plans to restart. Has not been checking blood glucose readings for the past few weeks, not lately because he is feeling bad.  Denies any symptoms of hypoglycemia.  Reviewed to HOLD Jardiance during period of diarrhea due to risk of dehydration and euglycemic DKA.  Recommended to continue current regimen as previously prescribed. Follow up with next PCP visit  Hypertension, CAD, CHF (most recent EF 35-40%) Appropriately managed; current treatment: carvedilol 12.5 mg BID, furosemide 20 mg every other day, isosorbide 30 mg daily, Entresto 49/51 mg BID, spironolactone 25 mg daily Due for refill of isosorbide and furosemide. Will message Dr. Gollan to ask that a short refill be sent to tide over until appointment Has not been checking BP at home recently. Denies any symptoms of hypotension.  Recommended to continue current regimen. Follow up with Dr. Gollan next month as scheduled   Hyperlipidemia and ASCVD risk reduction Very well controlled; current treatment: atorvastatin 80 mg daily, ezetimibe 10 mg daily Current antiplatelet regimen: aspirin 81 mg daily, clopidogrel 75 mg daily Due for refill of clopidogrel. Will message Dr. Gollan to ask that a short refill be sent to tide over until appointment Recommended to continue current regimenat this time.   Depression/Anxiety: Appropriately well controlled given recent life stressors; escitalopram 10 mg daily Recommended to continue current regimen at this time  BPH Appropriately well controlled per patient symptom report; tamsulosin 0.4 mg daily - patient  reports he is taking, but refill history indicates it has not been filled since May   and last prescribed over a year ago. Will collaborate w/ PCP to send refill if appropriate. Recommended to continue current regimen at this time  Moderate Obstructive Lung Disease: Uncontrolled per patient symptom report; Advair 250/50 mcg 1 puff BID, albuterol rescue inhaler PRN Recommended to continue current regimen at this time  Patient Goals/Self-Care Activities Over the next 90 days, patient will:  - take medications as prescribed check blood glucose at least three times daily using CGM, document, and provide at future appointments collaborate with provider on medication access solutions  Follow Up Plan: Telephone follow up appointment with care management team member scheduled for: ~ 6 weeks      Medication Assistance: None required.  Patient affirms current coverage meets needs.  Patient's preferred pharmacy is:  [facility], [address] [place] [address] Phone: [phone] Fax: [phone]  [id number] [place] [place] [id number]: [phone]: [phone]   Follow Up:  Patient agrees to Care Plan and Follow-up.  Plan: Face to Face appointment with care management team member scheduled for: 6 weeks  Catie TDarnelle Maffucci PharmD, BMount Briar CSulphur SpringsClinical Pharmacist LOccidental Petroleumat BJohnson [employer] [phone]

## 2021-03-29 NOTE — Telephone Encounter (Signed)
Please see the message below. I would advise that he go to urgent care or [facility] for evaluation of this issue.

## 2021-03-29 NOTE — Telephone Encounter (Signed)
Patient reports he "caught that virus", has not tested, but was at the beach 2 weeks ago with family and a grandchild got sick. He notes sneezing and fatigue up until 3 days ago when he started having diarrhea. Reports everything that he eats "runs right through me", and he's often unable to get to the bathroom in time due to mobility issues so is using a bucket. Has been taking OTC pepto bismol with some benefit.  Encouraged focus on hydration. Advised to hold Jardiance while at risk of dehydration. Denies fever, chills. Has not been checking blood sugars or blood pressures because he feels poorly, but denies any symptoms of hypoglycemia (dizziness, shakiness, sweating) hypotension (lightheadedness, dizziness, feeling of falling).   Notifying PCP.   Patient was also overdue for regular PCP f/u, I assisted in scheduling that today in October.

## 2021-03-30 NOTE — Telephone Encounter (Signed)
I called and spoke with the patient and he is not feeling well he stated I encouraged him that per the provider he needed to go to [facility] urgent care and he understood and stated he would go.  Matthewjames Petrasek,cma

## 2021-04-01 ENCOUNTER — Other Ambulatory Visit: Payer: Self-pay | Admitting: Family Medicine

## 2021-04-01 ENCOUNTER — Other Ambulatory Visit: Payer: Self-pay | Admitting: Cardiovascular Disease

## 2021-04-01 DIAGNOSIS — IMO0002 Reserved for concepts with insufficient information to code with codable children: Secondary | ICD-10-CM

## 2021-04-01 DIAGNOSIS — E1165 Type 2 diabetes mellitus with hyperglycemia: Secondary | ICD-10-CM

## 2021-04-01 MED ORDER — TAMSULOSIN HCL 0.4 MG PO CAPS: 0.4000 mg | ORAL_CAPSULE | Freq: Every day | ORAL | 0 refills | Status: DC

## 2021-04-01 NOTE — Addendum Note (Signed)
Addended by: [name] on: 04/01/2021 09:31 AM   Modules accepted: Orders

## 2021-04-16 ENCOUNTER — Other Ambulatory Visit: Payer: Self-pay | Admitting: Cardiovascular Disease

## 2021-04-19 ENCOUNTER — Other Ambulatory Visit: Payer: Self-pay

## 2021-04-19 NOTE — Telephone Encounter (Signed)
Refills for  Lasix IMDUR Plavix have been sent in  Pt has OV next Monday 9/19 with Dr. Mariah Milling

## 2021-04-23 NOTE — Progress Notes (Deleted)
Date:  04/23/2021   ID:  Danny Organ Sr., DOB 27-Nov-1950, MRN [id number]  Patient Location:  [address] [place] [address]   Provider location:   [facility], [place] office  PCP:  [name], MD  Cardiologist:  [name]   No chief complaint on file.   History of Present Illness:    Danny LAVERDIERE Sr. is a 70 y.o. male  past medical history of smoke 4 packs a day who stopped earlier in 2011,  coronary artery disease,  cardiac catheterization showing severe two-vessel disease,  severe stenosis of the proximal to mid LAD, severe ostial O1 and O2 disease, moderate to severe mid left circumflex disease that appeared aneurysmal, moderate OM 2 disease ejection fraction 40%  PCI of the LAD on January 11, 2010,  Chronic renal insufficiency,  poorly controlled diabetes on insulin,  hyperlipidemia who presents for routine followup of his coronary artery disease.   covid positive 07/2019, COVID-19 viral pneumonia  hemoglobin A1c 11.2.   pt oxygen saturation went down to 88%, CXR: hazy bilateral peripheral opacities Remdesivir and steroids. Records reviewed  Labs reviewed CR 1.4  Mother with covid, died  Total chol 164, LDL 74 last year 08/2018  Previously seen by Dr. Lucky Cowboy  lower extremity angiography Has not followed up   EKG personally reviewed by myself on todays visit NSR rate 95 bpm, T wave ABn anterolateral leads, I and AVL Same as 10/2017   Prior CV studies:   The following studies were reviewed today:    Past Medical History:  Diagnosis Date   CAD (coronary artery disease)    a. 01/2010 PCI of LAD; b. 09/2014 PCI/DES of mLAD due to ISR. D1 80, D2 80(jailed), LCX 17m; c. 07/2016 NSTEMI/Cath: LM nl, LAD 84m ISR, 40d, D1 90ost, 80p, D2 90ost, RI min irregs, LCX min irregs, OM1 90 small, OM2/3 min irregs, RCA min irregs, RPLB1 90, EF 25-35%.   Chronic combined systolic and diastolic CHF (congestive heart failure) ([place])     a. 07/2016 Echo: EF 30%, severe septal/anterior HK, Gr1 DD.   CKD (chronic kidney disease), stage III (HCC)    COPD (chronic obstructive pulmonary disease) (HCC)    DM2 (diabetes mellitus, type 2) (HCC)    Erectile dysfunction    HLD (hyperlipidemia)    HTN (hypertension)    Ischemic cardiomyopathy    a. 07/2016 Echo: EF 30% w/ sev septal/ant HK. Gr1 DD.   Past Surgical History:  Procedure Laterality Date   AMPUTATION Left 11/10/2020   Procedure: AMPUTATION BELOW KNEE;  Surgeon: [name], MD;  Location: [facility] ORS;  Service: General;  Laterality: Left;   BELOW KNEE LEG AMPUTATION     CAD: stent to the LAD     CARDIAC CATHETERIZATION  10/01/2014   CARDIAC CATHETERIZATION N/A 07/23/2016   Procedure: Left Heart Cath and Coronary Angiography;  Surgeon: [name], MD;  Location: [place] [facility];  Service: Cardiovascular;  Laterality: N/A;   CORONARY ANGIOPLASTY WITH STENT PLACEMENT  10/01/2014   LOWER EXTREMITY ANGIOGRAPHY Left 10/31/2020   Procedure: LOWER EXTREMITY ANGIOGRAPHY;  Surgeon: [name], MD;  Location: [place] [facility];  Service: Cardiovascular;  Laterality: Left;     No outpatient medications have been marked as taking for the 04/24/21 encounter (Appointment) with [name], MD.     Allergies:   Patient has no known allergies.   Social History   Tobacco  Use   Smoking status: Former    Packs/day: 3.00    Years: 0.00    Pack years: 0.00    Types: Cigarettes    Quit date: 09/07/2009    Years since quitting: 11.6   Smokeless tobacco: Never   Tobacco comments:    quit june 2011  Vaping Use   Vaping Use: Never used  Substance Use Topics   Alcohol use: Yes    Comment: every other weekend   Drug use: No     Current Outpatient Medications on File Prior to Visit  Medication Sig Dispense Refill   ACCU-CHEK GUIDE test strip CHECK CBGS 4 TIMES A DAY 300 strip 1   albuterol (PROVENTIL) (2.5 MG/3ML) 0.083% nebulizer solution Take 3 mLs (2.5 mg  total) by nebulization every 6 (six) hours as needed for wheezing or shortness of breath. 150 mL 1   aspirin 81 MG tablet Take 81 mg by mouth daily.     atorvastatin (LIPITOR) 80 MG tablet TAKE 1 TABLET BY MOUTH EVERY DAY 90 tablet 3   baclofen (LIORESAL) 10 MG tablet TAKE 1 TABLET BY MOUTH THREE TIMES A DAY AS NEEDED FOR MUSCLE SPASMS 30 tablet 0   blood glucose meter kit and supplies KIT Dispense based on patient and insurance preference. Check CBGs 4 times daily. Dx code E11.9. 1 each 0   carvedilol (COREG) 12.5 MG tablet Take 1 tablet (12.5 mg total) by mouth 2 (two) times daily with a meal. 180 tablet 3   clopidogrel (PLAVIX) 75 MG tablet TAKE 1 TABLET BY MOUTH EVERY DAY 90 tablet 3   Dulaglutide (TRULICITY) 4.5 DP/8.2UM SOPN Inject 4.5 mg as directed once a week. 6 mL 3   escitalopram (LEXAPRO) 10 MG tablet TAKE 1 TABLET BY MOUTH EVERY DAY 90 tablet 1   ezetimibe (ZETIA) 10 MG tablet Take 1 tablet (10 mg total) by mouth daily. 90 tablet 3   fluticasone-salmeterol (ADVAIR) 250-50 MCG/ACT AEPB Inhale 1 puff into the lungs 2 (two) times daily. 3 each 1   furosemide (LASIX) 20 MG tablet TAKE 1 TABLET (20 MG) BY MOUTH ONCE EVERY OTHER DAY 45 tablet 3   gabapentin (NEURONTIN) 300 MG capsule Take 1 capsule (300 mg total) by mouth at bedtime. 30 capsule 3   insulin degludec (TRESIBA FLEXTOUCH) 100 UNIT/ML FlexTouch Pen Inject 40 Units into the skin daily. 30 mL 3   Insulin Pen Needle (PEN NEEDLES) 32G X 6 MM MISC 1 each by Does not apply route 2 (two) times daily. 100 each 11   isosorbide mononitrate (IMDUR) 30 MG 24 hr tablet TAKE 1 TABLET BY MOUTH EVERY DAY 90 tablet 3   JARDIANCE 25 MG TABS tablet TAKE 1 TABLET BY MOUTH DAILY 90 tablet 3   metFORMIN (GLUCOPHAGE-XR) 500 MG 24 hr tablet Take 1 tablet (500 mg total) by mouth 2 (two) times daily with a meal. 180 tablet 1   nitroGLYCERIN (NITROSTAT) 0.4 MG SL tablet Place 1 tablet (0.4 mg total) under the tongue every 5 (five) minutes as needed for  chest pain. Maximum of 3 doses. (Patient not taking: Reported on 03/29/2021) 25 tablet 3   oxyCODONE-acetaminophen (PERCOCET) 7.5-325 MG tablet Take 1 tablet by mouth every 8 (eight) hours as needed for severe pain. 12 tablet 0   sacubitril-valsartan (ENTRESTO) 49-51 MG Take 1 tablet by mouth 2 (two) times daily. STOP LISINOPRIL. 60 tablet 2   silver sulfADIAZINE (SILVADENE) 1 % cream Apply 1 application topically daily. (Patient not taking: Reported on  03/29/2021) 50 g 0   spironolactone (ALDACTONE) 25 MG tablet Take 1 tablet (25 mg total) by mouth daily. 90 tablet 1   tamsulosin (FLOMAX) 0.4 MG CAPS capsule Take 1 capsule (0.4 mg total) by mouth daily. 90 capsule 0   VENTOLIN HFA 108 (90 Base) MCG/ACT inhaler Inhale 2 puffs into the lungs every 6 (six) hours as needed for wheezing or shortness of breath. 18 g 3   No current facility-administered medications on file prior to visit.     Family Hx: The patient's family history includes Cancer in his brother; Heart attack in his father.  ROS:   Please see the history of present illness.    Review of Systems  Constitutional: Negative.   HENT: Negative.    Respiratory:  Positive for shortness of breath.   Cardiovascular: Negative.   Gastrointestinal: Negative.   Musculoskeletal:  Positive for back pain and joint pain.  Neurological: Negative.   Psychiatric/Behavioral: Negative.    All other systems reviewed and are negative.    Labs/Other Tests and Data Reviewed:    Recent Labs: 10/26/2020: B Natriuretic Peptide 94.0 11/11/2020: Magnesium 1.9 12/12/2020: ALT 24; Hemoglobin 13.4; Platelets 239 01/24/2021: BUN 22; Creatinine, Ser 1.15; Potassium 3.8; Sodium 136   Recent Lipid Panel Lab Results  Component Value Date/Time   CHOL 96 10/26/2020 10:48 AM   TRIG 60 10/26/2020 10:48 AM   HDL 46 10/26/2020 10:48 AM   CHOLHDL 2.1 10/26/2020 10:48 AM   LDLCALC 38 10/26/2020 10:48 AM   LDLDIRECT 64.0 03/20/2018 09:50 AM    Wt Readings from Last  3 Encounters:  02/23/21 202 lb (91.6 kg)  01/24/21 202 lb (91.6 kg)  01/03/21 202 lb (91.6 kg)     Exam:    Vital Signs: Vital signs may also be detailed in the HPI There were no vitals taken for this visit. Constitutional:  oriented to person, place, and time. No distress.  HENT:  Head: Grossly normal Eyes:  no discharge. No scleral icterus.  Neck: No JVD, no carotid bruits  Cardiovascular: Regular rate and rhythm, no murmurs appreciated Pulmonary/Chest: Clear to auscultation bilaterally, no wheezes or rails Abdominal: Soft.  no distension.  no tenderness.  Musculoskeletal: Normal range of motion Neurological:  normal muscle tone. Coordination normal. No atrophy Skin: Skin warm and dry Psychiatric: normal affect, pleasant   ASSESSMENT & PLAN:    Chronic combined systolic and diastolic CHF (congestive heart failure) (HCC) Appears relatively euvolemic on today's visit Shortness of breath likely secondary to recovery from Covid and emphysema He is on inhalers No changes to his medications  Coronary artery disease of native artery of native heart with stable angina pectoris (HCC) Stressed importance of diabetes control Poor diet, worsening numbers recently in the setting of Covid and received steroids Continue statin , Zetia, beta-blocker, long-acting nitrate  Centrilobular emphysema (HCC) Stop smoking many years ago, now on inhalers post Covid  Essential hypertension Did not take his medications this morning, long walk stressful getting into the office  Recommend he closely monitor blood pressure at home  Diabetes mellitus type 2, uncontrolled, with complications ([place]) Recommend he go back to his strict diet  CKD (chronic kidney disease) stage 3, GFR 30-59 ml/min (HCC) Stable renal function  Pure hypercholesterolemia Stressed importance of aggressive diabetes control   Total encounter time more than 25 minutes  Greater than 50% was spent in counseling and  coordination of care with the patient   Signed, Ida Rogue, MD  04/23/2021 10:02 AM  [place] Office [id number], [place], [place] [address]

## 2021-04-24 ENCOUNTER — Ambulatory Visit: Payer: 59 | Admitting: Cardiovascular Disease

## 2021-04-24 DIAGNOSIS — E1165 Type 2 diabetes mellitus with hyperglycemia: Secondary | ICD-10-CM

## 2021-04-24 DIAGNOSIS — I70219 Atherosclerosis of native arteries of extremities with intermittent claudication, unspecified extremity: Secondary | ICD-10-CM

## 2021-04-24 DIAGNOSIS — N183 Chronic kidney disease, stage 3 unspecified: Secondary | ICD-10-CM

## 2021-04-24 DIAGNOSIS — J432 Centrilobular emphysema: Secondary | ICD-10-CM

## 2021-04-24 DIAGNOSIS — R0600 Dyspnea, unspecified: Secondary | ICD-10-CM

## 2021-04-24 DIAGNOSIS — I779 Disorder of arteries and arterioles, unspecified: Secondary | ICD-10-CM

## 2021-04-24 DIAGNOSIS — I10 Essential (primary) hypertension: Secondary | ICD-10-CM

## 2021-04-24 DIAGNOSIS — E785 Hyperlipidemia, unspecified: Secondary | ICD-10-CM

## 2021-04-25 ENCOUNTER — Encounter: Payer: Self-pay | Admitting: Cardiovascular Disease

## 2021-04-28 ENCOUNTER — Encounter (INDEPENDENT_AMBULATORY_CARE_PROVIDER_SITE_OTHER): Payer: Self-pay | Admitting: Vascular Surgery

## 2021-04-28 ENCOUNTER — Ambulatory Visit (INDEPENDENT_AMBULATORY_CARE_PROVIDER_SITE_OTHER): Payer: 59 | Admitting: Vascular Surgery

## 2021-04-28 ENCOUNTER — Other Ambulatory Visit: Payer: Self-pay

## 2021-04-28 VITALS — BP 140/77 | HR 92 | Resp 16

## 2021-04-28 DIAGNOSIS — Z89512 Acquired absence of left leg below knee: Secondary | ICD-10-CM

## 2021-04-28 DIAGNOSIS — I70262 Atherosclerosis of native arteries of extremities with gangrene, left leg: Secondary | ICD-10-CM | POA: Diagnosis not present

## 2021-04-28 DIAGNOSIS — E1169 Type 2 diabetes mellitus with other specified complication: Secondary | ICD-10-CM | POA: Diagnosis not present

## 2021-04-28 DIAGNOSIS — E785 Hyperlipidemia, unspecified: Secondary | ICD-10-CM

## 2021-04-28 DIAGNOSIS — E1142 Type 2 diabetes mellitus with diabetic polyneuropathy: Secondary | ICD-10-CM

## 2021-04-28 DIAGNOSIS — E7800 Pure hypercholesterolemia, unspecified: Secondary | ICD-10-CM

## 2021-04-28 DIAGNOSIS — I10 Essential (primary) hypertension: Secondary | ICD-10-CM

## 2021-04-28 NOTE — Assessment & Plan Note (Signed)
Now healed.  Referral for prosthesis evaluation.

## 2021-04-28 NOTE — Progress Notes (Signed)
MRN : [id number]  Danny Kamel Vanblarcom Sr. is a 70 y.o. (Sep 22, 1950) male who presents with chief complaint of  Chief Complaint  Patient presents with   Follow-up    6 wk follow up  .  History of Present Illness: Patient returns today in follow up of his PAD and left below-knee amputation.  That was done earlier this year.  It has healed well now.  He had some chronic wound issues which are a little slow to heal, but it now is intact and looks good.  He is interested in proceeding with getting his prosthetics evaluation.  No new complaints today.  Still does have occasional phantom pain  Current Outpatient Medications  Medication Sig Dispense Refill   ACCU-CHEK GUIDE test strip CHECK CBGS 4 TIMES A DAY 300 strip 1   albuterol (PROVENTIL) (2.5 MG/3ML) 0.083% nebulizer solution Take 3 mLs (2.5 mg total) by nebulization every 6 (six) hours as needed for wheezing or shortness of breath. 150 mL 1   aspirin 81 MG tablet Take 81 mg by mouth daily.     atorvastatin (LIPITOR) 80 MG tablet TAKE 1 TABLET BY MOUTH EVERY DAY 90 tablet 3   baclofen (LIORESAL) 10 MG tablet TAKE 1 TABLET BY MOUTH THREE TIMES A DAY AS NEEDED FOR MUSCLE SPASMS 30 tablet 0   blood glucose meter kit and supplies KIT Dispense based on patient and insurance preference. Check CBGs 4 times daily. Dx code E11.9. 1 each 0   carvedilol (COREG) 12.5 MG tablet Take 1 tablet (12.5 mg total) by mouth 2 (two) times daily with a meal. 180 tablet 3   clopidogrel (PLAVIX) 75 MG tablet TAKE 1 TABLET BY MOUTH EVERY DAY 90 tablet 3   Dulaglutide (TRULICITY) 4.5 MG/0.5ML SOPN Inject 4.5 mg as directed once a week. 6 mL 3   escitalopram (LEXAPRO) 10 MG tablet TAKE 1 TABLET BY MOUTH EVERY DAY 90 tablet 1   ezetimibe (ZETIA) 10 MG tablet Take 1 tablet (10 mg total) by mouth daily. 90 tablet 3   fluticasone-salmeterol (ADVAIR) 250-50 MCG/ACT AEPB Inhale 1 puff into the lungs 2 (two) times daily. 3 each 1   furosemide (LASIX) 20 MG tablet TAKE 1 TABLET  (20 MG) BY MOUTH ONCE EVERY OTHER DAY 45 tablet 3   gabapentin (NEURONTIN) 300 MG capsule Take 1 capsule (300 mg total) by mouth at bedtime. 30 capsule 3   insulin degludec (TRESIBA FLEXTOUCH) 100 UNIT/ML FlexTouch Pen Inject 40 Units into the skin daily. 30 mL 3   Insulin Pen Needle (PEN NEEDLES) 32G X 6 MM MISC 1 each by Does not apply route 2 (two) times daily. 100 each 11   isosorbide mononitrate (IMDUR) 30 MG 24 hr tablet TAKE 1 TABLET BY MOUTH EVERY DAY 90 tablet 3   JARDIANCE 25 MG TABS tablet TAKE 1 TABLET BY MOUTH DAILY 90 tablet 3   metFORMIN (GLUCOPHAGE-XR) 500 MG 24 hr tablet Take 1 tablet (500 mg total) by mouth 2 (two) times daily with a meal. 180 tablet 1   oxyCODONE-acetaminophen (PERCOCET) 7.5-325 MG tablet Take 1 tablet by mouth every 8 (eight) hours as needed for severe pain. 12 tablet 0   sacubitril-valsartan (ENTRESTO) 49-51 MG Take 1 tablet by mouth 2 (two) times daily. STOP LISINOPRIL. 60 tablet 2   spironolactone (ALDACTONE) 25 MG tablet Take 1 tablet (25 mg total) by mouth daily. 90 tablet 1   tamsulosin (FLOMAX) 0.4 MG CAPS capsule Take 1 capsule (0.4 mg total) by  mouth daily. 90 capsule 0   VENTOLIN HFA 108 (90 Base) MCG/ACT inhaler Inhale 2 puffs into the lungs every 6 (six) hours as needed for wheezing or shortness of breath. 18 g 3   nitroGLYCERIN (NITROSTAT) 0.4 MG SL tablet Place 1 tablet (0.4 mg total) under the tongue every 5 (five) minutes as needed for chest pain. Maximum of 3 doses. (Patient not taking: No sig reported) 25 tablet 3   silver sulfADIAZINE (SILVADENE) 1 % cream Apply 1 application topically daily. (Patient not taking: No sig reported) 50 g 0   No current facility-administered medications for this visit.    Past Medical History:  Diagnosis Date   CAD (coronary artery disease)    a. 01/2010 PCI of LAD; b. 09/2014 PCI/DES of mLAD due to ISR. D1 80, D2 80(jailed), LCX 38m; c. 07/2016 NSTEMI/Cath: LM nl, LAD 33m ISR, 40d, D1 90ost, 80p, D2 90ost, RI  min irregs, LCX min irregs, OM1 90 small, OM2/3 min irregs, RCA min irregs, RPLB1 90, EF 25-35%.   Chronic combined systolic and diastolic CHF (congestive heart failure) ([place])    a. 07/2016 Echo: EF 30%, severe septal/anterior HK, Gr1 DD.   CKD (chronic kidney disease), stage III (HCC)    COPD (chronic obstructive pulmonary disease) (HCC)    DM2 (diabetes mellitus, type 2) (HCC)    Erectile dysfunction    HLD (hyperlipidemia)    HTN (hypertension)    Ischemic cardiomyopathy    a. 07/2016 Echo: EF 30% w/ sev septal/ant HK. Gr1 DD.    Past Surgical History:  Procedure Laterality Date   AMPUTATION Left 11/10/2020   Procedure: AMPUTATION BELOW KNEE;  Surgeon: [name], MD;  Location: [facility];  Service: General;  Laterality: Left;   BELOW KNEE LEG AMPUTATION     CAD: stent to the LAD     CARDIAC CATHETERIZATION  10/01/2014   CARDIAC CATHETERIZATION N/A 07/23/2016   Procedure: Left Heart Cath and Coronary Angiography;  Surgeon: [name], MD;  Location: [place] [facility];  Service: Cardiovascular;  Laterality: N/A;   CORONARY ANGIOPLASTY WITH STENT PLACEMENT  10/01/2014   LOWER EXTREMITY ANGIOGRAPHY Left 10/31/2020   Procedure: LOWER EXTREMITY ANGIOGRAPHY;  Surgeon: [name], MD;  Location: [facility] [facility];  Service: Cardiovascular;  Laterality: Left;     Social History   Tobacco Use   Smoking status: Former    Packs/day: 3.00    Years: 0.00    Pack years: 0.00    Types: Cigarettes    Quit date: 09/07/2009    Years since quitting: 11.6   Smokeless tobacco: Never   Tobacco comments:    quit june 2011  Vaping Use   Vaping Use: Never used  Substance Use Topics   Alcohol use: Yes    Comment: every other weekend   Drug use: No      Family History  Problem Relation Age of Onset   Heart attack Father        complications   Cancer Brother     No Known Allergies   REVIEW OF SYSTEMS (Negative unless checked)   Constitutional: $RemoveBeforeDE'[]'MlTyTBfWBQjOleQ$ Weight loss  $Rem'[]'LPtY$ Fever   '[]'$ Chills Cardiac: $RemoveBeforeD'[]'IVjxgYcNEMILQv$ Chest pain   '[]'$ Chest pressure   '[]'$ Palpitations   '[]'$ Shortness of breath when laying flat   '[x]'$ Shortness of breath at rest   '[x]'$ Shortness of breath with exertion. Vascular:  $RemoveBe'[]'TWebkuQAS$ Pain in legs with walking   '[]'$ Pain in legs at rest   '[]'$ Pain in legs when laying flat   '[]'$   Claudication   [] Pain in feet when walking  [] Pain in feet at rest  [] Pain in feet when laying flat   [] History of DVT   [] Phlebitis   [] Swelling in legs   [] Varicose veins   [x] Non-healing ulcers Pulmonary:   [] Uses home oxygen   [] Productive cough   [] Hemoptysis   [] Wheeze  [] COPD   [] Asthma Neurologic:  [] Dizziness  [] Blackouts   [] Seizures   [] History of stroke   [] History of TIA  [] Aphasia   [] Temporary blindness   [] Dysphagia   [] Weakness or numbness in arms   [] Weakness or numbness in legs Musculoskeletal:  [x] Arthritis   [] Joint swelling   [x] Joint pain   [] Low back pain Hematologic:  [] Easy bruising  [] Easy bleeding   [] Hypercoagulable state   [] Anemic  [] Hepatitis Gastrointestinal:  [] Blood in stool   [] Vomiting blood  [] Gastroesophageal reflux/heartburn   [] Abdominal pain Genitourinary:  [x] Chronic kidney disease   [] Difficult urination  [] Frequent urination  [] Burning with urination   [] Hematuria Skin:  [] Rashes   [x] Ulcers   [x] Wounds Psychological:  [] History of anxiety   []  History of major depression.  Physical Examination  BP 140/77 (BP Location: Right Arm)   Pulse 92   Resp 16  Gen:  WD/WN, NAD Head: [place]/AT, No temporalis wasting. Ear/Nose/Throat: Hearing grossly intact, nares w/o erythema or drainage Eyes: Conjunctiva clear. Sclera non-icteric Neck: Supple.  Trachea midline Pulmonary:  Good air movement, no use of accessory muscles.  Cardiac: RRR, no JVD Vascular:  Vessel Right Left  Radial Palpable Palpable       Musculoskeletal: M/S 5/5 throughout.  No deformity or atrophy.  In a wheelchair.  Left BKA now well-healed with incision clean, dry, and intact.  No significant right leg  edema. Neurologic: Sensation grossly intact in extremities.  Symmetrical.  Speech is fluent.  Psychiatric: Judgment intact, Mood & affect appropriate for pt's clinical situation. Dermatologic: No rashes or ulcers noted.  No cellulitis or open wounds.      Labs No results found for this or any previous visit (from the past 2160 hour(s)).  Radiology No results found.  Assessment/Plan Essential hypertension blood pressure control important in reducing the progression of atherosclerotic disease. On appropriate oral medications.     Diabetes mellitus type 2, uncontrolled, with complications (HCC) blood glucose control important in reducing the progression of atherosclerotic disease. Also, involved in wound healing. On appropriate medications.     Diabetic neuropathy (HCC) blood glucose control important in reducing the progression of atherosclerotic disease. Also, involved in wound healing. On appropriate medications.  His neuropathy is quite severe.     Hyperlipidemia lipid control important in reducing the progression of atherosclerotic disease. Continue statin therapy  Atherosclerosis of native arteries of the extremities with gangrene ([place]) Now status post left BKA.  Check ABI on the right and few months.  Hx of BKA, left ([place]) Now healed.  Referral for prosthesis evaluation.    Leotis Pain, MD  04/28/2021 2:59 PM    This note was created with Dragon medical transcription system.  Any errors from dictation are purely unintentional

## 2021-04-28 NOTE — Assessment & Plan Note (Signed)
Now status post left BKA.  Check ABI on the right and few months.

## 2021-05-12 ENCOUNTER — Other Ambulatory Visit: Payer: Self-pay | Admitting: Family Medicine

## 2021-05-17 ENCOUNTER — Ambulatory Visit: Payer: 59 | Admitting: Family Medicine

## 2021-05-23 ENCOUNTER — Other Ambulatory Visit: Payer: Self-pay | Admitting: Family Medicine

## 2021-05-23 DIAGNOSIS — I5032 Chronic diastolic (congestive) heart failure: Secondary | ICD-10-CM

## 2021-05-24 DIAGNOSIS — I5022 Chronic systolic (congestive) heart failure: Secondary | ICD-10-CM | POA: Diagnosis not present

## 2021-05-24 DIAGNOSIS — I739 Peripheral vascular disease, unspecified: Secondary | ICD-10-CM | POA: Diagnosis not present

## 2021-05-24 DIAGNOSIS — I2510 Atherosclerotic heart disease of native coronary artery without angina pectoris: Secondary | ICD-10-CM | POA: Diagnosis not present

## 2021-05-24 DIAGNOSIS — N179 Acute kidney failure, unspecified: Secondary | ICD-10-CM | POA: Diagnosis not present

## 2021-05-24 DIAGNOSIS — Z89512 Acquired absence of left leg below knee: Secondary | ICD-10-CM | POA: Diagnosis not present

## 2021-06-24 DIAGNOSIS — I739 Peripheral vascular disease, unspecified: Secondary | ICD-10-CM | POA: Diagnosis not present

## 2021-06-24 DIAGNOSIS — I2510 Atherosclerotic heart disease of native coronary artery without angina pectoris: Secondary | ICD-10-CM | POA: Diagnosis not present

## 2021-06-24 DIAGNOSIS — I5022 Chronic systolic (congestive) heart failure: Secondary | ICD-10-CM | POA: Diagnosis not present

## 2021-06-24 DIAGNOSIS — Z89512 Acquired absence of left leg below knee: Secondary | ICD-10-CM | POA: Diagnosis not present

## 2021-06-24 DIAGNOSIS — N179 Acute kidney failure, unspecified: Secondary | ICD-10-CM | POA: Diagnosis not present

## 2021-07-19 ENCOUNTER — Other Ambulatory Visit: Payer: Self-pay | Admitting: Family Medicine

## 2021-07-24 ENCOUNTER — Telehealth: Payer: Self-pay | Admitting: Family Medicine

## 2021-07-24 ENCOUNTER — Other Ambulatory Visit: Payer: Self-pay

## 2021-07-24 DIAGNOSIS — J432 Centrilobular emphysema: Secondary | ICD-10-CM

## 2021-07-24 MED ORDER — FLUTICASONE-SALMETEROL 250-50 MCG/ACT IN AEPB: 1.0000 | INHALATION_SPRAY | Freq: Two times a day (BID) | RESPIRATORY_TRACT | 1 refills | Status: DC

## 2021-07-24 MED ORDER — VENTOLIN HFA 108 (90 BASE) MCG/ACT IN AERS: 2.0000 | INHALATION_SPRAY | Freq: Four times a day (QID) | RESPIRATORY_TRACT | 3 refills | Status: DC | PRN

## 2021-07-24 NOTE — Telephone Encounter (Signed)
Patient  called in need inhaler Albuterol and Advir need to be sent to [facility] . Out of meds

## 2021-07-28 ENCOUNTER — Encounter (INDEPENDENT_AMBULATORY_CARE_PROVIDER_SITE_OTHER): Payer: 59

## 2021-07-28 ENCOUNTER — Ambulatory Visit (INDEPENDENT_AMBULATORY_CARE_PROVIDER_SITE_OTHER): Payer: 59 | Admitting: Nurse Practitioner

## 2021-08-02 ENCOUNTER — Ambulatory Visit (INDEPENDENT_AMBULATORY_CARE_PROVIDER_SITE_OTHER): Payer: Medicaid Other | Admitting: Nurse Practitioner

## 2021-08-02 ENCOUNTER — Encounter (INDEPENDENT_AMBULATORY_CARE_PROVIDER_SITE_OTHER): Payer: Medicaid Other

## 2021-08-04 ENCOUNTER — Telehealth: Payer: Self-pay | Admitting: Family Medicine

## 2021-08-04 NOTE — Telephone Encounter (Signed)
Patient came into the office and is requesting a referral for someone to come to his house and give him physical therapy. He would like to learn to walk again.

## 2021-08-08 NOTE — Telephone Encounter (Signed)
I called and scheduled the patient for a visit for PT referral.  Danny [name],cma

## 2021-08-09 ENCOUNTER — Ambulatory Visit: Payer: Medicare Other | Admitting: Internal Medicine

## 2021-08-09 ENCOUNTER — Telehealth: Payer: Self-pay | Admitting: Internal Medicine

## 2021-08-09 NOTE — Telephone Encounter (Signed)
Patient no-showed today's appointment; appointment was for 08/09/20, provider notified for review of record. No Fee applied. Letter sent for patient to call in and re-schedule.

## 2021-08-15 ENCOUNTER — Emergency Department: Payer: Medicare Other

## 2021-08-15 ENCOUNTER — Other Ambulatory Visit: Payer: Self-pay

## 2021-08-15 ENCOUNTER — Inpatient Hospital Stay
Admission: EM | Admit: 2021-08-15 | Discharge: 2021-08-17 | DRG: 191 | Disposition: A | Discharge status: Home Health | Payer: Medicare Other | Attending: Family Medicine | Admitting: Family Medicine

## 2021-08-15 DIAGNOSIS — Z79899 Other long term (current) drug therapy: Secondary | ICD-10-CM | POA: Diagnosis not present

## 2021-08-15 DIAGNOSIS — A419 Sepsis, unspecified organism: Secondary | ICD-10-CM

## 2021-08-15 DIAGNOSIS — Z7982 Long term (current) use of aspirin: Secondary | ICD-10-CM

## 2021-08-15 DIAGNOSIS — I5022 Chronic systolic (congestive) heart failure: Secondary | ICD-10-CM | POA: Diagnosis present

## 2021-08-15 DIAGNOSIS — E1140 Type 2 diabetes mellitus with diabetic neuropathy, unspecified: Secondary | ICD-10-CM | POA: Diagnosis present

## 2021-08-15 DIAGNOSIS — R6510 Systemic inflammatory response syndrome (SIRS) of non-infectious origin without acute organ dysfunction: Secondary | ICD-10-CM

## 2021-08-15 DIAGNOSIS — E1165 Type 2 diabetes mellitus with hyperglycemia: Secondary | ICD-10-CM | POA: Diagnosis present

## 2021-08-15 DIAGNOSIS — I2510 Atherosclerotic heart disease of native coronary artery without angina pectoris: Secondary | ICD-10-CM | POA: Diagnosis present

## 2021-08-15 DIAGNOSIS — J439 Emphysema, unspecified: Secondary | ICD-10-CM | POA: Diagnosis present

## 2021-08-15 DIAGNOSIS — E785 Hyperlipidemia, unspecified: Secondary | ICD-10-CM | POA: Diagnosis present

## 2021-08-15 DIAGNOSIS — N400 Enlarged prostate without lower urinary tract symptoms: Secondary | ICD-10-CM | POA: Diagnosis present

## 2021-08-15 DIAGNOSIS — I255 Ischemic cardiomyopathy: Secondary | ICD-10-CM | POA: Diagnosis present

## 2021-08-15 DIAGNOSIS — F32A Depression, unspecified: Secondary | ICD-10-CM | POA: Diagnosis present

## 2021-08-15 DIAGNOSIS — Z794 Long term (current) use of insulin: Secondary | ICD-10-CM

## 2021-08-15 DIAGNOSIS — Z7985 Long-term (current) use of injectable non-insulin antidiabetic drugs: Secondary | ICD-10-CM

## 2021-08-15 DIAGNOSIS — E1151 Type 2 diabetes mellitus with diabetic peripheral angiopathy without gangrene: Secondary | ICD-10-CM | POA: Diagnosis present

## 2021-08-15 DIAGNOSIS — E1169 Type 2 diabetes mellitus with other specified complication: Secondary | ICD-10-CM | POA: Diagnosis present

## 2021-08-15 DIAGNOSIS — I252 Old myocardial infarction: Secondary | ICD-10-CM | POA: Diagnosis not present

## 2021-08-15 DIAGNOSIS — J441 Chronic obstructive pulmonary disease with (acute) exacerbation: Secondary | ICD-10-CM | POA: Diagnosis present

## 2021-08-15 DIAGNOSIS — N183 Chronic kidney disease, stage 3 unspecified: Secondary | ICD-10-CM | POA: Diagnosis present

## 2021-08-15 DIAGNOSIS — E872 Acidosis, unspecified: Secondary | ICD-10-CM | POA: Diagnosis present

## 2021-08-15 DIAGNOSIS — F419 Anxiety disorder, unspecified: Secondary | ICD-10-CM | POA: Diagnosis present

## 2021-08-15 DIAGNOSIS — Z20822 Contact with and (suspected) exposure to covid-19: Secondary | ICD-10-CM | POA: Diagnosis present

## 2021-08-15 DIAGNOSIS — Z87891 Personal history of nicotine dependence: Secondary | ICD-10-CM

## 2021-08-15 DIAGNOSIS — I130 Hypertensive heart and chronic kidney disease with heart failure and stage 1 through stage 4 chronic kidney disease, or unspecified chronic kidney disease: Secondary | ICD-10-CM | POA: Diagnosis present

## 2021-08-15 DIAGNOSIS — Z7902 Long term (current) use of antithrombotics/antiplatelets: Secondary | ICD-10-CM | POA: Diagnosis not present

## 2021-08-15 DIAGNOSIS — E1122 Type 2 diabetes mellitus with diabetic chronic kidney disease: Secondary | ICD-10-CM | POA: Diagnosis present

## 2021-08-15 DIAGNOSIS — T380X5A Adverse effect of glucocorticoids and synthetic analogues, initial encounter: Secondary | ICD-10-CM | POA: Diagnosis present

## 2021-08-15 DIAGNOSIS — Z7984 Long term (current) use of oral hypoglycemic drugs: Secondary | ICD-10-CM | POA: Diagnosis not present

## 2021-08-15 DIAGNOSIS — Z89512 Acquired absence of left leg below knee: Secondary | ICD-10-CM

## 2021-08-15 DIAGNOSIS — I10 Essential (primary) hypertension: Secondary | ICD-10-CM | POA: Diagnosis present

## 2021-08-15 DIAGNOSIS — Z8249 Family history of ischemic heart disease and other diseases of the circulatory system: Secondary | ICD-10-CM

## 2021-08-15 LAB — RESP PANEL BY RT-PCR (FLU A&B, COVID) ARPGX2
Influenza A by PCR: NEGATIVE
Influenza B by PCR: NEGATIVE
SARS Coronavirus 2 by RT PCR: NEGATIVE

## 2021-08-15 LAB — CBC WITH DIFFERENTIAL/PLATELET
Abs Immature Granulocytes: 0.13 10*3/uL — ABNORMAL HIGH (ref 0.00–0.07)
Basophils Absolute: 0.1 10*3/uL (ref 0.0–0.1)
Basophils Relative: 0 %
Eosinophils Absolute: 0.1 10*3/uL (ref 0.0–0.5)
Eosinophils Relative: 1 %
HCT: 49 % (ref 39.0–52.0)
Hemoglobin: 16 g/dL (ref 13.0–17.0)
Immature Granulocytes: 1 %
Lymphocytes Relative: 10 %
Lymphs Abs: 2.2 10*3/uL (ref 0.7–4.0)
MCH: 30.4 pg (ref 26.0–34.0)
MCHC: 32.7 g/dL (ref 30.0–36.0)
MCV: 93.2 fL (ref 80.0–100.0)
Monocytes Absolute: 2.4 10*3/uL — ABNORMAL HIGH (ref 0.1–1.0)
Monocytes Relative: 11 %
Neutro Abs: 17.2 10*3/uL — ABNORMAL HIGH (ref 1.7–7.7)
Neutrophils Relative %: 77 %
Platelets: 246 10*3/uL (ref 150–400)
RBC: 5.26 MIL/uL (ref 4.22–5.81)
RDW: 13 % (ref 11.5–15.5)
WBC: 22.2 10*3/uL — ABNORMAL HIGH (ref 4.0–10.5)
nRBC: 0 % (ref 0.0–0.2)

## 2021-08-15 LAB — COMPREHENSIVE METABOLIC PANEL
ALT: 21 U/L (ref 0–44)
AST: 20 U/L (ref 15–41)
Albumin: 3.9 g/dL (ref 3.5–5.0)
Alkaline Phosphatase: 106 U/L (ref 38–126)
Anion gap: 10 (ref 5–15)
BUN: 21 mg/dL (ref 8–23)
CO2: 23 mmol/L (ref 22–32)
Calcium: 8.8 mg/dL — ABNORMAL LOW (ref 8.9–10.3)
Chloride: 106 mmol/L (ref 98–111)
Creatinine, Ser: 1.11 mg/dL (ref 0.61–1.24)
GFR, Estimated: >60 mL/min (ref >60)
Glucose, Bld: 165 mg/dL — ABNORMAL HIGH (ref 70–99)
Potassium: 3.3 mmol/L — ABNORMAL LOW (ref 3.5–5.1)
Sodium: 139 mmol/L (ref 135–145)
Total Bilirubin: 0.7 mg/dL (ref 0.3–1.2)
Total Protein: 7.2 g/dL (ref 6.5–8.1)

## 2021-08-15 LAB — LACTIC ACID, PLASMA
Lactic Acid, Venous: 2.3 mmol/L (ref 0.5–1.9)
Lactic Acid, Venous: 2.3 mmol/L (ref 0.5–1.9)

## 2021-08-15 LAB — TROPONIN I (HIGH SENSITIVITY)
Troponin I (High Sensitivity): 26 ng/L — ABNORMAL HIGH (ref <18)
Troponin I (High Sensitivity): 27 ng/L — ABNORMAL HIGH (ref <18)

## 2021-08-15 LAB — PROCALCITONIN: Procalcitonin: 0.15 ng/mL

## 2021-08-15 LAB — BRAIN NATRIURETIC PEPTIDE: B Natriuretic Peptide: 239.9 pg/mL — ABNORMAL HIGH (ref 0.0–100.0)

## 2021-08-15 LAB — GLUCOSE, CAPILLARY: Glucose-Capillary: 264 mg/dL — ABNORMAL HIGH (ref 70–99)

## 2021-08-15 MED ORDER — SPIRONOLACTONE 25 MG PO TABS
25.0000 mg | ORAL_TABLET | Freq: Every day | ORAL | Status: DC
Start: 2021-08-15 — End: 2021-08-17
  Administered 2021-08-16 – 2021-08-17 (×2): 25 mg via ORAL
  Filled 2021-08-15 (×2): qty 1

## 2021-08-15 MED ORDER — ALBUTEROL SULFATE (2.5 MG/3ML) 0.083% IN NEBU: 2.5000 mg | INHALATION_SOLUTION | RESPIRATORY_TRACT | Status: DC | PRN

## 2021-08-15 MED ORDER — CALCIUM CARBONATE ANTACID 500 MG PO CHEW
600.0000 mg | CHEWABLE_TABLET | Freq: Three times a day (TID) | ORAL | Status: DC | PRN
  Administered 2021-08-15: 600 mg via ORAL
  Filled 2021-08-15: qty 3

## 2021-08-15 MED ORDER — POTASSIUM CHLORIDE CRYS ER 20 MEQ PO TBCR
40.0000 meq | EXTENDED_RELEASE_TABLET | Freq: Once | ORAL | Status: AC
  Administered 2021-08-15: 40 meq via ORAL
  Filled 2021-08-15: qty 2

## 2021-08-15 MED ORDER — METRONIDAZOLE 500 MG/100ML IV SOLN
500.0000 mg | Freq: Once | INTRAVENOUS | Status: AC
  Administered 2021-08-15: 500 mg via INTRAVENOUS
  Filled 2021-08-15: qty 100

## 2021-08-15 MED ORDER — ASPIRIN 81 MG PO CHEW
81.0000 mg | CHEWABLE_TABLET | Freq: Every day | ORAL | Status: DC
  Administered 2021-08-15 – 2021-08-17 (×3): 81 mg via ORAL
  Filled 2021-08-15 (×3): qty 1

## 2021-08-15 MED ORDER — IPRATROPIUM-ALBUTEROL 0.5-2.5 (3) MG/3ML IN SOLN
3.0000 mL | Freq: Once | RESPIRATORY_TRACT | Status: AC
  Administered 2021-08-15: 3 mL via RESPIRATORY_TRACT

## 2021-08-15 MED ORDER — SACUBITRIL-VALSARTAN 49-51 MG PO TABS
1.0000 | ORAL_TABLET | Freq: Two times a day (BID) | ORAL | Status: DC
  Administered 2021-08-16 – 2021-08-17 (×2): 1 via ORAL
  Filled 2021-08-15 (×4): qty 1

## 2021-08-15 MED ORDER — VANCOMYCIN HCL IN DEXTROSE 1-5 GM/200ML-% IV SOLN
1000.0000 mg | Freq: Once | INTRAVENOUS | Status: AC
  Administered 2021-08-15: 1000 mg via INTRAVENOUS
  Filled 2021-08-15: qty 200

## 2021-08-15 MED ORDER — SODIUM CHLORIDE 0.9 % IV BOLUS
1000.0000 mL | Freq: Once | INTRAVENOUS | Status: AC
Start: 2021-08-15 — End: 2021-08-15
  Administered 2021-08-15: 1000 mL via INTRAVENOUS

## 2021-08-15 MED ORDER — PREDNISONE 20 MG PO TABS
40.0000 mg | ORAL_TABLET | Freq: Every day | ORAL | Status: DC
  Administered 2021-08-17: 40 mg via ORAL
  Filled 2021-08-15: qty 2

## 2021-08-15 MED ORDER — INSULIN ASPART 100 UNIT/ML IJ SOLN
0.0000 [IU] | Freq: Every day | INTRAMUSCULAR | Status: DC
  Administered 2021-08-16: 2 [IU] via SUBCUTANEOUS
  Filled 2021-08-15: qty 1

## 2021-08-15 MED ORDER — IPRATROPIUM-ALBUTEROL 0.5-2.5 (3) MG/3ML IN SOLN
3.0000 mL | Freq: Once | RESPIRATORY_TRACT | Status: AC
  Administered 2021-08-15: 3 mL via RESPIRATORY_TRACT
  Filled 2021-08-15: qty 3

## 2021-08-15 MED ORDER — ISOSORBIDE MONONITRATE ER 60 MG PO TB24
30.0000 mg | ORAL_TABLET | Freq: Every day | ORAL | Status: DC
  Administered 2021-08-16 – 2021-08-17 (×2): 30 mg via ORAL
  Filled 2021-08-15 (×2): qty 1

## 2021-08-15 MED ORDER — ATORVASTATIN CALCIUM 20 MG PO TABS
80.0000 mg | ORAL_TABLET | Freq: Every day | ORAL | Status: DC
  Administered 2021-08-15 – 2021-08-16 (×2): 80 mg via ORAL
  Filled 2021-08-15 (×2): qty 4

## 2021-08-15 MED ORDER — EMPAGLIFLOZIN 25 MG PO TABS
25.0000 mg | ORAL_TABLET | Freq: Every day | ORAL | Status: DC
  Administered 2021-08-16 – 2021-08-17 (×2): 25 mg via ORAL
  Filled 2021-08-15 (×2): qty 1

## 2021-08-15 MED ORDER — INSULIN ASPART 100 UNIT/ML IJ SOLN
0.0000 [IU] | Freq: Three times a day (TID) | INTRAMUSCULAR | Status: DC
  Administered 2021-08-16: 15 [IU] via SUBCUTANEOUS
  Administered 2021-08-16: 8 [IU] via SUBCUTANEOUS
  Administered 2021-08-16: 11 [IU] via SUBCUTANEOUS
  Administered 2021-08-17: 3 [IU] via SUBCUTANEOUS
  Filled 2021-08-15 (×4): qty 1

## 2021-08-15 MED ORDER — METHYLPREDNISOLONE SODIUM SUCC 125 MG IJ SOLR
125.0000 mg | Freq: Once | INTRAMUSCULAR | Status: AC
  Administered 2021-08-15: 125 mg via INTRAVENOUS
  Filled 2021-08-15: qty 2

## 2021-08-15 MED ORDER — IPRATROPIUM-ALBUTEROL 0.5-2.5 (3) MG/3ML IN SOLN
3.0000 mL | Freq: Four times a day (QID) | RESPIRATORY_TRACT | Status: DC
  Administered 2021-08-16: 3 mL via RESPIRATORY_TRACT
  Filled 2021-08-15 (×2): qty 3

## 2021-08-15 MED ORDER — ENOXAPARIN SODIUM 40 MG/0.4ML IJ SOSY
40.0000 mg | PREFILLED_SYRINGE | INTRAMUSCULAR | Status: DC
  Administered 2021-08-15 – 2021-08-16 (×2): 40 mg via SUBCUTANEOUS
  Filled 2021-08-15 (×2): qty 0.4

## 2021-08-15 MED ORDER — SODIUM CHLORIDE 0.9 % IV SOLN
1.0000 g | INTRAVENOUS | Status: DC
  Administered 2021-08-16: 1 g via INTRAVENOUS
  Filled 2021-08-15 (×2): qty 10

## 2021-08-15 MED ORDER — IOHEXOL 350 MG/ML SOLN
100.0000 mL | Freq: Once | INTRAVENOUS | Status: AC | PRN
  Administered 2021-08-15: 100 mL via INTRAVENOUS

## 2021-08-15 MED ORDER — SODIUM CHLORIDE 0.9 % IV SOLN
2.0000 g | Freq: Once | INTRAVENOUS | Status: AC
  Administered 2021-08-15: 2 g via INTRAVENOUS
  Filled 2021-08-15: qty 2

## 2021-08-15 MED ORDER — METHYLPREDNISOLONE SODIUM SUCC 40 MG IJ SOLR
40.0000 mg | Freq: Two times a day (BID) | INTRAMUSCULAR | Status: AC
  Administered 2021-08-16 (×2): 40 mg via INTRAVENOUS
  Filled 2021-08-15 (×2): qty 1

## 2021-08-15 MED ORDER — ESCITALOPRAM OXALATE 10 MG PO TABS
10.0000 mg | ORAL_TABLET | Freq: Every day | ORAL | Status: DC
  Administered 2021-08-16 – 2021-08-17 (×2): 10 mg via ORAL
  Filled 2021-08-15 (×2): qty 1

## 2021-08-15 MED ORDER — CARVEDILOL 6.25 MG PO TABS
12.5000 mg | ORAL_TABLET | Freq: Two times a day (BID) | ORAL | Status: DC
  Administered 2021-08-16 – 2021-08-17 (×3): 12.5 mg via ORAL
  Filled 2021-08-15 (×3): qty 2

## 2021-08-15 NOTE — ED Triage Notes (Signed)
Pt to ED for fever and shob for past few days. NAD noted

## 2021-08-15 NOTE — H&P (Signed)
History and Physical    Danny CAPUANO Sr. YKD:[id number] DOB: 1950-09-07 DOA: 08/15/2021  PCP: [name], MD   Patient coming from: home  I have personally briefly reviewed patient's relevant medical records in [place]  Chief Complaint: shortness of breath  HPI: Danny GULAS Sr. is a 71 y.o. male with medical history significant for COPD, HTN, DM, CAD, systolic CHF, PAD s/p BKA 11/2020 during a hospitalization for sepsis secondary to gangrene left foot, who presents to the ED with a several day history of shortness of breath, wheezing and subjective fever.  no vomiting or diarrhea and denies abdominal pain.  Has been using his nebulizers with little improvement and thus presented to the emergency room for evaluation.States he had a 5-minute episode of chest pain a couple days ago that relieved on its own before he was able to take nitroglycerin.  He denies orthopnea, lower extremity edema  ED course: Afebrile, tachycardic to 121, tachypneic to 24 with O2 sats mid 90s on room air.  BP 104/85 Blood work: WBC 22,000 with lactic acid 2.3-2.3 Troponin 26-27 CMP mildly unremarkable with potassium 3.3 and glucose 165 COVID and flu negative  EKG, personally viewed and interpreted: Sinus tachycardia at 119 with nonspecific ST-T wave changes  Imaging CTA chest shows no PE, no focal consolidation or pneumonia.  Severe emphysematous changes with mild bronchial thickening of the right lower lobe concerning for bronchitis CT abdomen and pelvis nonacute  Patient was treated with duo nebs and Solu-Medrol.  Also started on IV fluid bolus and broad-spectrum antibiotics for sepsis of unknown source.  Hospitalist consulted for admission.   Review of Systems: As per HPI otherwise all other systems on review of systems negative.   Assessment/Plan    COPD with acute bronchitis (HCC)   SIRS with lactic acidosis/possible sepsis - Schedule and as needed nebulized bronchodilator therapy -  IV Rocephin and azithromycin - IV steroids, antitussives - Supplemental oxygen if needed - Follow procalcitonin to evaluate for possible sepsis    Type 2 diabetes mellitus with hyperlipidemia (HCC) - Sliding scale insulin coverage    Essential hypertension - Continue carvedilol and spironolactone    CAD (coronary artery disease)    PAD s/p left BKA 11/2020 - Continue aspirin, atorvastatin and carvedilol and clopidogrel, nitroglycerin    Anxiety and depression - Continue Lexapro    Chronic systolic heart failure ([place]), EF 35 to 40% 12/2020 - Euvolemic - Continue carvedilol, Entresto, isosorbide mononitrate, Jardiance, spironolactone - Monitor for worsening in view of IV fluid  BPH - Continue tamsulosin   DVT prophylaxis: Lovenox  Code Status: full code  Family Communication:  none  Disposition Plan: Back to previous home environment Consults called: none  Status:At the time of admission, it appears that the appropriate admission status for this patient is INPATIENT. This is judged to be reasonable and necessary in order to provide the required intensity of service to ensure the patient's safety given the presenting symptoms, physical exam findings, and initial radiographic and laboratory data in the context of their  Comorbid conditions.   Patient requires inpatient status due to high intensity of service, high risk for further deterioration and high frequency of surveillance required.   I certify that at the point of admission it is my clinical judgment that the patient will require inpatient hospital care spanning beyond 2 midnights     Physical Exam: Vitals:   08/15/21 1513 08/15/21 1514 08/15/21 1859  BP: 104/85  (!) 149/87  Pulse:  (!) 121 (!) 105  Resp: (!) 24  (!) 22  Temp: 98.5 F (36.9 C)    TempSrc: Oral    SpO2:  95% 94%  Weight:  97.5 kg   Height:  6' (1.829 m)    Constitutional: Alert, oriented x 3 . Not in any apparent distress HEENT:      Head:  Normocephalic and atraumatic.         Eyes: PERLA, EOMI, Conjunctivae are normal. Sclera is non-icteric.       Mouth/Throat: Mucous membranes are moist.       Neck: Supple with no signs of meningismus. Cardiovascular: Regular rate and rhythm. No murmurs, gallops, or rubs. 2+ symmetrical distal pulses are present . No JVD. No  LE edema Respiratory: Respiratory effort increased with scattered wheezes gastrointestinal: Soft, non tender, non distended. Positive bowel sounds.  Genitourinary: No CVA tenderness. Musculoskeletal: Nontender with normal range of motion in all extremities. No cyanosis, or erythema of extremities. Neurologic:  Face is symmetric. Moving all extremities. No gross focal neurologic deficits . Skin: Skin is warm, dry.  No rash or ulcers Psychiatric: Mood and affect are appropriate     Past Medical History:  Diagnosis Date   CAD (coronary artery disease)    a. 01/2010 PCI of LAD; b. 09/2014 PCI/DES of mLAD due to ISR. D1 80, D2 80(jailed), LCX 34m; c. 07/2016 NSTEMI/Cath: LM nl, LAD 75m ISR, 40d, D1 90ost, 80p, D2 90ost, RI min irregs, LCX min irregs, OM1 90 small, OM2/3 min irregs, RCA min irregs, RPLB1 90, EF 25-35%.   Chronic combined systolic and diastolic CHF (congestive heart failure) (HCC)    a. 07/2016 Echo: EF 30%, severe septal/anterior HK, Gr1 DD.   CKD (chronic kidney disease), stage III (HCC)    COPD (chronic obstructive pulmonary disease) (HCC)    DM2 (diabetes mellitus, type 2) (HCC)    Erectile dysfunction    HLD (hyperlipidemia)    HTN (hypertension)    Ischemic cardiomyopathy    a. 07/2016 Echo: EF 30% w/ sev septal/ant HK. Gr1 DD.    Past Surgical History:  Procedure Laterality Date   AMPUTATION Left 11/10/2020   Procedure: AMPUTATION BELOW KNEE;  Surgeon: Annice Needy, MD;  Location: [facility] ORS;  Service: General;  Laterality: Left;   BELOW KNEE LEG AMPUTATION     CAD: stent to the LAD     CARDIAC CATHETERIZATION  10/01/2014   CARDIAC CATHETERIZATION  N/A 07/23/2016   Procedure: Left Heart Cath and Coronary Angiography;  Surgeon: Iran Ouch, MD;  Location: [facility] INVASIVE [facility];  Service: Cardiovascular;  Laterality: N/A;   CORONARY ANGIOPLASTY WITH STENT PLACEMENT  10/01/2014   LOWER EXTREMITY ANGIOGRAPHY Left 10/31/2020   Procedure: LOWER EXTREMITY ANGIOGRAPHY;  Surgeon: Annice Needy, MD;  Location: [facility];  Service: Cardiovascular;  Laterality: Left;     reports that he quit smoking about 11 years ago. His smoking use included cigarettes. He smoked an average of 3.00 packs per day. He has never used smokeless tobacco. He reports current alcohol use. He reports that he does not use drugs.  No Known Allergies  Family History  Problem Relation Age of Onset   Heart attack Father        complications   Cancer Brother       Prior to Admission medications   Medication Sig Start Date End Date Taking? Authorizing Provider  ACCU-CHEK GUIDE test strip CHECK CBGS 4 TIMES A DAY 11/16/19  [name], MD  albuterol (PROVENTIL) (2.5 MG/3ML) 0.083% nebulizer solution Take 3 mLs (2.5 mg total) by nebulization every 6 (six) hours as needed for wheezing or shortness of breath. 09/30/20   [name], MD  aspirin 81 MG tablet Take 81 mg by mouth daily.    [provider]  atorvastatin (LIPITOR) 80 MG tablet TAKE 1 TABLET BY MOUTH EVERY DAY 12/09/20   [name], MD  baclofen (LIORESAL) 10 MG tablet TAKE 1 TABLET BY MOUTH THREE TIMES A DAY AS NEEDED FOR MUSCLE SPASMS 10/24/20   [name], MD  blood glucose meter kit and supplies KIT Dispense based on patient and insurance preference. Check CBGs 4 times daily. Dx code E11.9. 09/17/19   [name], MD  carvedilol (COREG) 12.5 MG tablet Take 1 tablet (12.5 mg total) by mouth 2 (two) times daily with a meal. 01/24/21   [name], MD  clopidogrel (PLAVIX) 75 MG tablet TAKE 1 TABLET BY MOUTH EVERY DAY 04/17/21   [name], MD   Continuous Blood Gluc Sensor (FREESTYLE LIBRE 2 SENSOR) MISC by Does not apply route.    [provider]  ENTRESTO 49-51 MG TAKE 1 TABLET BY MOUTH 2 (TWO) TIMES DAILY. STOP LISINOPRIL AND POTASSIUM 05/25/21   [name], MD  escitalopram (LEXAPRO) 10 MG tablet TAKE 1 TABLET BY MOUTH EVERY DAY 03/15/21   [name], MD  ezetimibe (ZETIA) 10 MG tablet Take 1 tablet (10 mg total) by mouth daily. 01/30/21   [name], MD  fluticasone-salmeterol (ADVAIR) 250-50 MCG/ACT AEPB Inhale 1 puff into the lungs 2 (two) times daily. 07/24/21   [name], MD  furosemide (LASIX) 20 MG tablet TAKE 1 TABLET (20 MG) BY MOUTH ONCE EVERY OTHER DAY 04/17/21   [name], MD  gabapentin (NEURONTIN) 300 MG capsule Take 1 capsule (300 mg total) by mouth at bedtime. 12/06/20   Kris Hartmann, NP  insulin degludec (TRESIBA FLEXTOUCH) 100 UNIT/ML FlexTouch Pen Inject 40 Units into the skin daily. 03/29/21   [name], MD  Insulin Pen Needle (PEN NEEDLES) 32G X 6 MM MISC 1 each by Does not apply route 2 (two) times daily. 03/06/21   [name], MD  isosorbide mononitrate (IMDUR) 30 MG 24 hr tablet TAKE 1 TABLET BY MOUTH EVERY DAY 04/03/21   [name], MD  JARDIANCE 25 MG TABS tablet TAKE 1 TABLET BY MOUTH DAILY 12/09/20   [name], MD  metFORMIN (GLUCOPHAGE-XR) 500 MG 24 hr tablet Take 1 tablet (500 mg total) by mouth 2 (two) times daily with a meal. 01/24/21   [name], MD  nitroGLYCERIN (NITROSTAT) 0.4 MG SL tablet Place 1 tablet (0.4 mg total) under the tongue every 5 (five) minutes as needed for chest pain. Maximum of 3 doses. Patient not taking: No sig reported 10/26/20   Marrianne Mood D, PA-C  oxyCODONE-acetaminophen (PERCOCET) 7.5-325 MG tablet Take 1 tablet by mouth every 8 (eight) hours as needed for severe pain. 11/15/20   Sharen Hones, MD  silver sulfADIAZINE (SILVADENE) 1 % cream Apply 1 application topically daily. Patient not  taking: No sig reported 12/16/20   Kris Hartmann, NP  spironolactone (ALDACTONE) 25 MG tablet Take 1 tablet (25 mg total) by mouth daily. 01/30/21   [name], MD  tamsulosin (FLOMAX) 0.4 MG CAPS capsule Take 1 capsule (0.4 mg total) by mouth daily. 04/01/21   Tommi Rumps  G, MD  TRULICITY 4.5 NW/2.9FA SOPN INJECT 4.5 MG AS DIRECTED ONCE A WEEK. 07/19/21   [name], MD  VENTOLIN HFA 108 (90 Base) MCG/ACT inhaler Inhale 2 puffs into the lungs every 6 (six) hours as needed for wheezing or shortness of breath. 07/24/21   [name], MD      Labs on Admission: I have personally reviewed following labs and imaging studies  CBC: Recent Labs  Lab 08/15/21 1517  WBC 22.2*  NEUTROABS 17.2*  HGB 16.0  HCT 49.0  MCV 93.2  PLT 213   Basic Metabolic Panel: Recent Labs  Lab 08/15/21 1517  NA 139  K 3.3*  CL 106  CO2 23  GLUCOSE 165*  BUN 21  CREATININE 1.11  CALCIUM 8.8*   GFR: Estimated Creatinine Clearance: 75 mL/min (by C-G formula based on SCr of 1.11 mg/dL). Liver Function Tests: Recent Labs  Lab 08/15/21 1517  AST 20  ALT 21  ALKPHOS 106  BILITOT 0.7  PROT 7.2  ALBUMIN 3.9   No results for input(s): LIPASE, AMYLASE in the last 168 hours. No results for input(s): AMMONIA in the last 168 hours. Coagulation Profile: No results for input(s): INR, PROTIME in the last 168 hours. Cardiac Enzymes: No results for input(s): CKTOTAL, CKMB, CKMBINDEX, TROPONINI in the last 168 hours. BNP (last 3 results) No results for input(s): PROBNP in the last 8760 hours. HbA1C: No results for input(s): HGBA1C in the last 72 hours. CBG: No results for input(s): GLUCAP in the last 168 hours. Lipid Profile: No results for input(s): CHOL, HDL, LDLCALC, TRIG, CHOLHDL, LDLDIRECT in the last 72 hours. Thyroid Function Tests: No results for input(s): TSH, T4TOTAL, FREET4, T3FREE, THYROIDAB in the last 72 hours. Anemia Panel: No results for input(s): VITAMINB12,  FOLATE, FERRITIN, TIBC, IRON, RETICCTPCT in the last 72 hours. Urine analysis:    Component Value Date/Time   COLORURINE YELLOW (A) 11/15/2020 1605   APPEARANCEUR CLEAR (A) 11/15/2020 1605   APPEARANCEUR Clear 11/23/2014 2124   LABSPEC 1.016 11/15/2020 1605   LABSPEC 1.025 11/23/2014 2124   PHURINE 5.0 11/15/2020 1605   GLUCOSEU 50 (A) 11/15/2020 1605   GLUCOSEU >=500 11/23/2014 2124   HGBUR SMALL (A) 11/15/2020 1605   BILIRUBINUR NEGATIVE 11/15/2020 1605   BILIRUBINUR Negative 11/23/2014 2124   KETONESUR NEGATIVE 11/15/2020 1605   PROTEINUR NEGATIVE 11/15/2020 1605   NITRITE NEGATIVE 11/15/2020 1605   LEUKOCYTESUR NEGATIVE 11/15/2020 1605   LEUKOCYTESUR Negative 11/23/2014 2124    Radiological Exams on Admission: DG Chest 2 View  Result Date: 08/15/2021 CLINICAL DATA:  Shortness of breath EXAM: CHEST - 2 VIEW COMPARISON:  11/06/2020 FINDINGS: Normal cardiac and mediastinal contours. Redemonstrated elevation of the right hemidiaphragm. Persistent streaky opacities in the right lung, likely scarring. No focal pulmonary opacity. No pleural effusion. No acute osseous abnormality. IMPRESSION: No active cardiopulmonary disease. Electronically Signed   By: Merilyn Baba M.D.   On: 08/15/2021 15:56   CT Angio Chest PE W and/or Wo Contrast  Result Date: 08/15/2021 CLINICAL DATA:  Headache cough and fever for 2 3 days. EXAM: CT ANGIOGRAPHY CHEST WITH CONTRAST TECHNIQUE: Multidetector CT imaging of the chest was performed using the standard protocol during bolus administration of intravenous contrast. Multiplanar CT image reconstructions and MIPs were obtained to evaluate the vascular anatomy. RADIATION DOSE REDUCTION: This exam was performed according to the departmental dose-optimization program which includes automated exposure control, adjustment of the mA and/or kV according to patient size and/or use of iterative reconstruction  technique. CONTRAST:  113mL OMNIPAQUE IOHEXOL 350 MG/ML SOLN  COMPARISON:  Chest radiograph performed earlier on the same date. FINDINGS: Cardiovascular: Satisfactory opacification of the pulmonary arteries to the segmental level. No evidence of pulmonary embolism. Normal heart size. Coronary artery atherosclerotic calcifications. No pericardial effusion. Mediastinum/Nodes: No enlarged mediastinal, hilar, or axillary lymph nodes. Thyroid gland, trachea, and esophagus demonstrate no significant findings. Lungs/Pleura: Severe centrilobular and paraseptal emphysematous changes. Mild fibrotic changes in the right lung base. Mild bronchial thickening of the right lower lobe bronchi concerning for bronchitis. No focal consolidation. Upper Abdomen: No acute abnormality. Musculoskeletal: Diffuse idiopathic skeletal hyperostosis. No acute osseous abnormality. Review of the MIP images confirms the above findings. IMPRESSION: 1.  No evidence of pulmonary embolism. 2.  Severe emphysematous changes of bilateral upper lobes. 3. Mild bronchial thickening of the right lower lobe bronchi concerning for bronchitis. No focal consolidation or pneumonia. Electronically Signed   By: Keane Police D.O.   On: 08/15/2021 19:05   CT ABDOMEN PELVIS W CONTRAST  Result Date: 08/15/2021 CLINICAL DATA:  Abdomen pain fever EXAM: CT ABDOMEN AND PELVIS WITH CONTRAST TECHNIQUE: Multidetector CT imaging of the abdomen and pelvis was performed using the standard protocol following bolus administration of intravenous contrast. CONTRAST:  156mL OMNIPAQUE IOHEXOL 350 MG/ML SOLN COMPARISON:  CT 08/17/2016 FINDINGS: Lower chest: Lung bases demonstrate mild right lower lobe bronchiectasis with bronchial wall thickening. Emphysema. No acute airspace disease. Coronary vascular calcification Hepatobiliary: No focal liver abnormality is seen. No gallstones, gallbladder wall thickening, or biliary dilatation. Pancreas: Unremarkable. No pancreatic ductal dilatation or surrounding inflammatory changes. Spleen: Normal in  size without focal abnormality. Adrenals/Urinary Tract: Adrenal glands are normal. Kidneys show no hydronephrosis. Cyst upper pole left kidney. The bladder is normal Stomach/Bowel: The stomach is nonenlarged. No dilated small bowel. Negative appendix. No acute bowel wall thickening Vascular/Lymphatic: Advanced aortic atherosclerosis. No aneurysm. No suspicious lymph nodes Reproductive: Prostate is unremarkable. Other: Negative for pelvic effusion or free air. Small fat containing inguinal hernias. Musculoskeletal: No acute or suspicious osseous abnormality IMPRESSION: 1. No CT evidence for acute intra-abdominal or pelvic abnormality. 2. Emphysema at the lung bases Electronically Signed   By: Donavan Foil M.D.   On: 08/15/2021 19:03       Athena Masse MD [facility]   08/15/2021, 7:48 PM

## 2021-08-15 NOTE — ED Notes (Signed)
ED TO INPATIENT HANDOFF REPORT  ED Nurse Name and Phone #:   S Name/Age/Gender Danny Estimable Criscione Sr. 71 y.o. male Room/Bed: ED17A/ED17A  Code Status   Code Status: Prior  Home/SNF/Other Home Patient oriented to: self, place, time, and situation Is this baseline? Yes   Triage Complete: Triage complete  Chief Complaint COPD with acute exacerbation ([place]) [J44.1]  Triage Note Pt to ED for fever and shob for past few days. NAD noted   Allergies No Known Allergies  Level of Care/Admitting Diagnosis ED Disposition     ED Disposition  Admit   Condition  --   Comment  Hospital Area: [facility] [[id number]]  Level of Care: Med-Surg [16]  Covid Evaluation: Asymptomatic Screening Protocol (No Symptoms)  Diagnosis: COPD with acute exacerbation [facility]) [id number]  Admitting Physician: Athena Masse [id number]  Attending Physician: Athena Masse [id number]  Estimated length of stay: past midnight tomorrow  Certification:: I certify this patient will need inpatient services for at least 2 midnights          B Medical/Surgery History Past Medical History:  Diagnosis Date   CAD (coronary artery disease)    a. 01/2010 PCI of LAD; b. 09/2014 PCI/DES of mLAD due to ISR. D1 80, D2 80(jailed), LCX 69m; c. 07/2016 NSTEMI/Cath: LM nl, LAD 62m ISR, 40d, D1 90ost, 80p, D2 90ost, RI min irregs, LCX min irregs, OM1 90 small, OM2/3 min irregs, RCA min irregs, RPLB1 90, EF 25-35%.   Chronic combined systolic and diastolic CHF (congestive heart failure) ([place])    a. 07/2016 Echo: EF 30%, severe septal/anterior HK, Gr1 DD.   CKD (chronic kidney disease), stage III (HCC)    COPD (chronic obstructive pulmonary disease) (HCC)    DM2 (diabetes mellitus, type 2) (HCC)    Erectile dysfunction    HLD (hyperlipidemia)    HTN (hypertension)    Ischemic cardiomyopathy    a. 07/2016 Echo: EF 30% w/ sev septal/ant HK. Gr1 DD.   Past Surgical History:  Procedure Laterality Date    AMPUTATION Left 11/10/2020   Procedure: AMPUTATION BELOW KNEE;  Surgeon: [name], MD;  Location: [facility] ORS;  Service: General;  Laterality: Left;   BELOW KNEE LEG AMPUTATION     CAD: stent to the LAD     CARDIAC CATHETERIZATION  10/01/2014   CARDIAC CATHETERIZATION N/A 07/23/2016   Procedure: Left Heart Cath and Coronary Angiography;  Surgeon: [name], MD;  Location: [facility];  Service: Cardiovascular;  Laterality: N/A;   CORONARY ANGIOPLASTY WITH STENT PLACEMENT  10/01/2014   LOWER EXTREMITY ANGIOGRAPHY Left 10/31/2020   Procedure: LOWER EXTREMITY ANGIOGRAPHY;  Surgeon: [name], MD;  Location: [facility];  Service: Cardiovascular;  Laterality: Left;     A IV Location/Drains/Wounds Patient Lines/Drains/Airways Status     Active Line/Drains/Airways     Name Placement date Placement time Site Days   Peripheral IV 12/12/20 Posterior;Right Forearm 12/12/20  1333  Forearm  246   Peripheral IV 08/15/21 20 G Anterior;Left Forearm 08/15/21  1516  Forearm  less than 1   Peripheral IV 08/15/21 20 G 1.25" Posterior;Proximal;Right Forearm 08/15/21  1830  Forearm  less than 1   Incision (Closed) 11/10/20 Leg Left 11/10/20  1607  -- 278            Intake/Output Last 24 hours No intake or output data in the 24 hours ending 08/15/21 1950  Labs/Imaging Results for orders placed or performed during  the hospital encounter of 08/15/21 (from the past 48 hour(s))  Lactic acid, plasma     Status: Abnormal   Collection Time: 08/15/21  3:17 PM  Result Value Ref Range   Lactic Acid, Venous 2.3 (HH) 0.5 - 1.9 mmol/L    Comment: CRITICAL RESULT CALLED TO, READ BACK BY AND VERIFIED WITH AMY COYNE 08/15/21 1547 MU Performed at [facility], [address] [place], [place] [address]   Comprehensive metabolic panel     Status: Abnormal   Collection Time: 08/15/21  3:17 PM  Result Value Ref Range   Sodium 139 135 - 145 mmol/L   Potassium 3.3 (L) 3.5 - 5.1  mmol/L   Chloride 106 98 - 111 mmol/L   CO2 23 22 - 32 mmol/L   Glucose, Bld 165 (H) 70 - 99 mg/dL    Comment: Glucose reference range applies only to samples taken after fasting for at least 8 hours.   BUN 21 8 - 23 mg/dL   Creatinine, Ser 1.11 0.61 - 1.24 mg/dL   Calcium 8.8 (L) 8.9 - 10.3 mg/dL   Total Protein 7.2 6.5 - 8.1 g/dL   Albumin 3.9 3.5 - 5.0 g/dL   AST 20 15 - 41 U/L   ALT 21 0 - 44 U/L   Alkaline Phosphatase 106 38 - 126 U/L   Total Bilirubin 0.7 0.3 - 1.2 mg/dL   GFR, Estimated >60 >60 mL/min    Comment: (NOTE) Calculated using the CKD-EPI Creatinine Equation (2021)    Anion gap 10 5 - 15    Comment: Performed at [facility], [address] [place], [place] [address]  CBC with Differential     Status: Abnormal   Collection Time: 08/15/21  3:17 PM  Result Value Ref Range   WBC 22.2 (H) 4.0 - 10.5 K/uL   RBC 5.26 4.22 - 5.81 MIL/uL   Hemoglobin 16.0 13.0 - 17.0 g/dL   HCT 49.0 39.0 - 52.0 %   MCV 93.2 80.0 - 100.0 fL   MCH 30.4 26.0 - 34.0 pg   MCHC 32.7 30.0 - 36.0 g/dL   RDW 13.0 11.5 - 15.5 %   Platelets 246 150 - 400 K/uL   nRBC 0.0 0.0 - 0.2 %   Neutrophils Relative % 77 %   Neutro Abs 17.2 (H) 1.7 - 7.7 K/uL   Lymphocytes Relative 10 %   Lymphs Abs 2.2 0.7 - 4.0 K/uL   Monocytes Relative 11 %   Monocytes Absolute 2.4 (H) 0.1 - 1.0 K/uL   Eosinophils Relative 1 %   Eosinophils Absolute 0.1 0.0 - 0.5 K/uL   Basophils Relative 0 %   Basophils Absolute 0.1 0.0 - 0.1 K/uL   Immature Granulocytes 1 %   Abs Immature Granulocytes 0.13 (H) 0.00 - 0.07 K/uL    Comment: Performed at [facility], [address], [place],  [address]  Resp Panel by RT-PCR (Flu A&B, Covid) Nasopharyngeal Swab     Status: None   Collection Time: 08/15/21  3:17 PM   Specimen: Nasopharyngeal Swab; Nasopharyngeal(NP) swabs in vial transport medium  Result Value Ref Range   SARS Coronavirus 2 by RT PCR NEGATIVE NEGATIVE    Comment: (NOTE) SARS-CoV-2  target nucleic acids are NOT DETECTED.  The SARS-CoV-2 RNA is generally detectable in upper respiratory specimens during the acute phase of infection. The lowest concentration of SARS-CoV-2 viral copies this assay can detect is 138 copies/mL. A negative result does not preclude SARS-Cov-2 infection  and should not be used as the sole basis for treatment or other patient management decisions. A negative result may occur with  improper specimen collection/handling, submission of specimen other than nasopharyngeal swab, presence of viral mutation(s) within the areas targeted by this assay, and inadequate number of viral copies(<138 copies/mL). A negative result must be combined with clinical observations, patient history, and epidemiological information. The expected result is Negative.  Fact Sheet for Patients:  EntrepreneurPulse.com.au  Fact Sheet for Healthcare Providers:  IncredibleEmployment.be  This test is no t yet approved or cleared by the [place] FDA and  has been authorized for detection and/or diagnosis of SARS-CoV-2 by FDA under an Emergency Use Authorization (EUA). This EUA will remain  in effect (meaning this test can be used) for the duration of the COVID-19 declaration under Section 564(b)(1) of the Act, 21 U.S.C.section 360bbb-3(b)(1), unless the authorization is terminated  or revoked sooner.       Influenza A by PCR NEGATIVE NEGATIVE   Influenza B by PCR NEGATIVE NEGATIVE    Comment: (NOTE) The Xpert Xpress SARS-CoV-2/FLU/RSV plus assay is intended as an aid in the diagnosis of influenza from Nasopharyngeal swab specimens and should not be used as a sole basis for treatment. Nasal washings and aspirates are unacceptable for Xpert Xpress SARS-CoV-2/FLU/RSV testing.  Fact Sheet for Patients: EntrepreneurPulse.com.au  Fact Sheet for Healthcare Providers: IncredibleEmployment.be  This  test is not yet approved or cleared by the [place] FDA and has been authorized for detection and/or diagnosis of SARS-CoV-2 by FDA under an Emergency Use Authorization (EUA). This EUA will remain in effect (meaning this test can be used) for the duration of the COVID-19 declaration under Section 564(b)(1) of the Act, 21 U.S.C. section 360bbb-3(b)(1), unless the authorization is terminated or revoked.  Performed at [facility], [place], [place] [address]   Troponin I (High Sensitivity)     Status: Abnormal   Collection Time: 08/15/21  3:17 PM  Result Value Ref Range   Troponin I (High Sensitivity) 26 (H) <18 ng/L    Comment: (NOTE) Elevated high sensitivity troponin I (hsTnI) values and significant  changes across serial measurements may suggest ACS but many other  chronic and acute conditions are known to elevate hsTnI results.  Refer to the "Links" section for chest pain algorithms and additional  guidance. Performed at [facility], [address] [place], [place] [address]   Brain natriuretic peptide     Status: Abnormal   Collection Time: 08/15/21  3:17 PM  Result Value Ref Range   B Natriuretic Peptide 239.9 (H) 0.0 - 100.0 pg/mL    Comment: Performed at [facility], [address] [place], [place] [address]  Lactic acid, plasma     Status: Abnormal   Collection Time: 08/15/21  6:08 PM  Result Value Ref Range   Lactic Acid, Venous 2.3 (HH) 0.5 - 1.9 mmol/L    Comment: CRITICAL VALUE NOTED. VALUE IS CONSISTENT WITH PREVIOUSLY REPORTED/CALLED VALUE MU Performed at [facility], [place], [place] [address]   Troponin I (High Sensitivity)     Status: Abnormal   Collection Time: 08/15/21  6:08 PM  Result Value Ref Range   Troponin I (High Sensitivity) 27 (H) <18 ng/L    Comment: (NOTE) Elevated high sensitivity troponin I (hsTnI) values and significant  changes across serial measurements may  suggest ACS but many other  chronic and acute conditions are known to elevate hsTnI results.  Refer to the "Links" section for chest pain algorithms and additional  guidance. Performed at [facility], [address] [place], [place] [address]    DG Chest 2 View  Result Date: 08/15/2021 CLINICAL DATA:  Shortness of breath EXAM: CHEST - 2 VIEW COMPARISON:  11/06/2020 FINDINGS: Normal cardiac and mediastinal contours. Redemonstrated elevation of the right hemidiaphragm. Persistent streaky opacities in the right lung, likely scarring. No focal pulmonary opacity. No pleural effusion. No acute osseous abnormality. IMPRESSION: No active cardiopulmonary disease. Electronically Signed   By: Merilyn Baba M.D.   On: 08/15/2021 15:56   CT Angio Chest PE W and/or Wo Contrast  Result Date: 08/15/2021 CLINICAL DATA:  Headache cough and fever for 2 3 days. EXAM: CT ANGIOGRAPHY CHEST WITH CONTRAST TECHNIQUE: Multidetector CT imaging of the chest was performed using the standard protocol during bolus administration of intravenous contrast. Multiplanar CT image reconstructions and MIPs were obtained to evaluate the vascular anatomy. RADIATION DOSE REDUCTION: This exam was performed according to the departmental dose-optimization program which includes automated exposure control, adjustment of the mA and/or kV according to patient size and/or use of iterative reconstruction technique. CONTRAST:  178mL OMNIPAQUE IOHEXOL 350 MG/ML SOLN COMPARISON:  Chest radiograph performed earlier on the same date. FINDINGS: Cardiovascular: Satisfactory opacification of the pulmonary arteries to the segmental level. No evidence of pulmonary embolism. Normal heart size. Coronary artery atherosclerotic calcifications. No pericardial effusion. Mediastinum/Nodes: No enlarged mediastinal, hilar, or axillary lymph nodes. Thyroid gland, trachea, and esophagus demonstrate no significant findings. Lungs/Pleura: Severe centrilobular  and paraseptal emphysematous changes. Mild fibrotic changes in the right lung base. Mild bronchial thickening of the right lower lobe bronchi concerning for bronchitis. No focal consolidation. Upper Abdomen: No acute abnormality. Musculoskeletal: Diffuse idiopathic skeletal hyperostosis. No acute osseous abnormality. Review of the MIP images confirms the above findings. IMPRESSION: 1.  No evidence of pulmonary embolism. 2.  Severe emphysematous changes of bilateral upper lobes. 3. Mild bronchial thickening of the right lower lobe bronchi concerning for bronchitis. No focal consolidation or pneumonia. Electronically Signed   By: Keane Police D.O.   On: 08/15/2021 19:05   CT ABDOMEN PELVIS W CONTRAST  Result Date: 08/15/2021 CLINICAL DATA:  Abdomen pain fever EXAM: CT ABDOMEN AND PELVIS WITH CONTRAST TECHNIQUE: Multidetector CT imaging of the abdomen and pelvis was performed using the standard protocol following bolus administration of intravenous contrast. CONTRAST:  11mL OMNIPAQUE IOHEXOL 350 MG/ML SOLN COMPARISON:  CT 08/17/2016 FINDINGS: Lower chest: Lung bases demonstrate mild right lower lobe bronchiectasis with bronchial wall thickening. Emphysema. No acute airspace disease. Coronary vascular calcification Hepatobiliary: No focal liver abnormality is seen. No gallstones, gallbladder wall thickening, or biliary dilatation. Pancreas: Unremarkable. No pancreatic ductal dilatation or surrounding inflammatory changes. Spleen: Normal in size without focal abnormality. Adrenals/Urinary Tract: Adrenal glands are normal. Kidneys show no hydronephrosis. Cyst upper pole left kidney. The bladder is normal Stomach/Bowel: The stomach is nonenlarged. No dilated small bowel. Negative appendix. No acute bowel wall thickening Vascular/Lymphatic: Advanced aortic atherosclerosis. No aneurysm. No suspicious lymph nodes Reproductive: Prostate is unremarkable. Other: Negative for pelvic effusion or free air. Small fat containing  inguinal hernias. Musculoskeletal: No acute or suspicious osseous abnormality IMPRESSION: 1. No CT evidence for acute intra-abdominal or pelvic abnormality. 2. Emphysema at the lung bases Electronically Signed   By: Donavan Foil M.D.   On: 08/15/2021 19:03    Pending Labs Unresulted Labs (From admission, onward)     Start     Ordered   08/16/21  0500  Procalcitonin  Daily,   STAT      08/15/21 1939   08/15/21 1940  Procalcitonin - Baseline  ONCE - STAT,   STAT        08/15/21 1939   08/15/21 1826  Blood culture (routine x 2)  BLOOD CULTURE X 2,   STAT      08/15/21 1825   08/15/21 1516  Urinalysis, Routine w reflex microscopic  ONCE - STAT,   STAT        08/15/21 1515            Vitals/Pain Today's Vitals   08/15/21 1513 08/15/21 1514 08/15/21 1859  BP: 104/85  (!) 149/87  Pulse:  (!) 121 (!) 105  Resp: (!) 24  (!) 22  Temp: 98.5 F (36.9 C)    TempSrc: Oral    SpO2:  95% 94%  Weight:  215 lb (97.5 kg)   Height:  6' (1.829 m)   PainSc:  0-No pain     Isolation Precautions No active isolations  Medications Medications  metroNIDAZOLE (FLAGYL) IVPB 500 mg (500 mg Intravenous New Bag/Given 08/15/21 1853)  vancomycin (VANCOCIN) IVPB 1000 mg/200 mL premix (has no administration in time range)  ipratropium-albuterol (DUONEB) 0.5-2.5 (3) MG/3ML nebulizer solution 3 mL (has no administration in time range)  ipratropium-albuterol (DUONEB) 0.5-2.5 (3) MG/3ML nebulizer solution 3 mL (has no administration in time range)  ipratropium-albuterol (DUONEB) 0.5-2.5 (3) MG/3ML nebulizer solution 3 mL (has no administration in time range)  methylPREDNISolone sodium succinate (SOLU-MEDROL) 125 mg/2 mL injection 125 mg (has no administration in time range)  potassium chloride SA (KLOR-CON M) CR tablet 40 mEq (has no administration in time range)  iohexol (OMNIPAQUE) 350 MG/ML injection 100 mL (100 mLs Intravenous Contrast Given 08/15/21 1840)  ceFEPIme (MAXIPIME) 2 g in sodium chloride 0.9 %  100 mL IVPB (2 g Intravenous New Bag/Given 08/15/21 1853)  sodium chloride 0.9 % bolus 1,000 mL (1,000 mLs Intravenous New Bag/Given 08/15/21 1852)    Mobility walks Low fall risk   Focused Assessments    R Recommendations: See Admitting Provider Note  Report given to:   Additional Notes:

## 2021-08-15 NOTE — Plan of Care (Signed)
  Problem: Education: Goal: Knowledge of General Education information will improve Description Including pain rating scale, medication(s)/side effects and non-pharmacologic comfort measures Outcome: Progressing   Problem: Activity: Goal: Risk for activity intolerance will decrease Outcome: Progressing   Problem: Safety: Goal: Ability to remain free from injury will improve Outcome: Progressing   

## 2021-08-15 NOTE — Consult Note (Signed)
PHARMACY -  BRIEF ANTIBIOTIC NOTE   Pharmacy has received consult(s) for sepsis from an ED provider.  The patient's profile has been reviewed for ht/wt/allergies/indication/available labs.    One time order(s) placed for cefepime and vancomycin  Further antibiotics/pharmacy consults should be ordered by admitting physician if indicated.                       Thank you, Ronnald Ramp 08/15/2021  6:27 PM

## 2021-08-15 NOTE — ED Notes (Signed)
First nurse-pt brought in via ems from home with a headache and cough and fever.sx for 2-3 days  fsbs 267 per ems.  Iv in place.  99.5 -t, 156/66, p-119, sats 96%  pt alert, in wheelchair

## 2021-08-15 NOTE — Consult Note (Signed)
CODE SEPSIS - PHARMACY COMMUNICATION  **Broad Spectrum Antibiotics should be administered within 1 hour of Sepsis diagnosis**  Time Code Sepsis Called/Page Received: 1825  Antibiotics Ordered: cefepime, vancomycin  Time of 1st antibiotic administration: 1853  Additional action taken by pharmacy: None     Ronnald Ramp ,PharmD Clinical Pharmacist  08/15/2021  7:04 PM

## 2021-08-15 NOTE — ED Provider Notes (Signed)
[facility] Provider Note    Event Date/Time   First MD Initiated Contact with Patient 08/15/21 1754     (approximate)   History   Fever and Shortness of Breath   HPI  Danny MURDOCH Sr. is a 71 y.o. male with diabetes, emphysema who comes in with shortness of breath.  Patient reportedly has a headache, cough, fever.  Temperature was 99.5.  Patient's had symptoms for 2 to 3 days.  Patient's had admissions previously for sepsis.  I reviewed his admission from 12/03/2020 where patient came in for sepsis secondary to a foot infection and ended up having to have an amputation below the knee.  He denies any foot pain.  Does report a little bit of increased urination and a little bit of right flank pain but he states that he has had that for weeks.  He reports taking Tylenol this morning. Patient also reports some shortness of breath and coughing over the past few days as well.  He does report a history of smoking but denies smoking currently.   Physical Exam   Triage Vital Signs: ED Triage Vitals  Enc Vitals Group     BP 08/15/21 1513 104/85     Pulse Rate 08/15/21 1514 (!) 121     Resp 08/15/21 1513 (!) 24     Temp 08/15/21 1513 98.5 F (36.9 C)     Temp Source 08/15/21 1513 Oral     SpO2 08/15/21 1514 95 %     Weight 08/15/21 1514 215 lb (97.5 kg)     Height 08/15/21 1514 6' (1.829 m)     Head Circumference --      Peak Flow --      Pain Score 08/15/21 1514 0     Pain Loc --      Pain Edu? --      Excl. in GC? --     Most recent vital signs: Vitals:   08/15/21 1513 08/15/21 1514  BP: 104/85   Pulse:  (!) 121  Resp: (!) 24   Temp: 98.5 F (36.9 C)   SpO2:  95%     General: Awake, no distress.  Disheveled, appears older than stated age CV:  Good peripheral perfusion.  Tachycardic Resp:  Normal effort.  No wheezing noted Abd:  No distention.  Nontender Other:  Feet were examined without any evidence of cellulitis on the right leg.  Left  amputation looks well-healing.  I also examined his testicles without any evidence of skin changes.   ED Results / Procedures / Treatments   Labs (all labs ordered are listed, but only abnormal results are displayed) Labs Reviewed  LACTIC ACID, PLASMA - Abnormal; Notable for the following components:      Result Value   Lactic Acid, Venous 2.3 (*)    All other components within normal limits  COMPREHENSIVE METABOLIC PANEL - Abnormal; Notable for the following components:   Potassium 3.3 (*)    Glucose, Bld 165 (*)    Calcium 8.8 (*)    All other components within normal limits  CBC WITH DIFFERENTIAL/PLATELET - Abnormal; Notable for the following components:   WBC 22.2 (*)    Neutro Abs 17.2 (*)    Monocytes Absolute 2.4 (*)    Abs Immature Granulocytes 0.13 (*)    All other components within normal limits  BRAIN NATRIURETIC PEPTIDE - Abnormal; Notable for the following components:   B Natriuretic Peptide 239.9 (*)    All  other components within normal limits  TROPONIN I (HIGH SENSITIVITY) - Abnormal; Notable for the following components:   Troponin I (High Sensitivity) 26 (*)    All other components within normal limits  RESP PANEL BY RT-PCR (FLU A&B, COVID) ARPGX2  LACTIC ACID, PLASMA  URINALYSIS, ROUTINE W REFLEX MICROSCOPIC  TROPONIN I (HIGH SENSITIVITY)     EKG  My interpretation of EKG:  EKG my interpretation is sinus tachycardia without any ST elevation but does have some T wave inversions in the lateral leads, normal intervals  RADIOLOGY I have reviewed the xray personally and agree with radiology read Chest x-ray reviewed no evidence of pneumonia  PROCEDURES:  Critical Care performed: Yes, see critical care procedure note(s)  .1-3 Lead EKG Interpretation Performed by: Concha Se, MD Authorized by: Concha Se, MD     Interpretation: abnormal     ECG rate:  120s   ECG rate assessment: tachycardic     Rhythm: sinus tachycardia     Ectopy: none      Conduction: normal   .Critical Care Performed by: Concha Se, MD Authorized by: Concha Se, MD   Critical care provider statement:    Critical care time (minutes):  30   Critical care was necessary to treat or prevent imminent or life-threatening deterioration of the following conditions:  Sepsis   Critical care was time spent personally by me on the following activities:  Development of treatment plan with patient or surrogate, discussions with consultants, evaluation of patient's response to treatment, examination of patient, ordering and review of laboratory studies, ordering and review of radiographic studies, ordering and performing treatments and interventions, pulse oximetry, re-evaluation of patient's condition and review of old charts   MEDICATIONS ORDERED IN ED: Medications  ceFEPIme (MAXIPIME) 2 g in sodium chloride 0.9 % 100 mL IVPB (2 g Intravenous New Bag/Given 08/15/21 1853)  metroNIDAZOLE (FLAGYL) IVPB 500 mg (500 mg Intravenous New Bag/Given 08/15/21 1853)  vancomycin (VANCOCIN) IVPB 1000 mg/200 mL premix (has no administration in time range)  ipratropium-albuterol (DUONEB) 0.5-2.5 (3) MG/3ML nebulizer solution 3 mL (has no administration in time range)  ipratropium-albuterol (DUONEB) 0.5-2.5 (3) MG/3ML nebulizer solution 3 mL (has no administration in time range)  ipratropium-albuterol (DUONEB) 0.5-2.5 (3) MG/3ML nebulizer solution 3 mL (has no administration in time range)  methylPREDNISolone sodium succinate (SOLU-MEDROL) 125 mg/2 mL injection 125 mg (has no administration in time range)  iohexol (OMNIPAQUE) 350 MG/ML injection 100 mL (100 mLs Intravenous Contrast Given 08/15/21 1840)  sodium chloride 0.9 % bolus 1,000 mL (1,000 mLs Intravenous New Bag/Given 08/15/21 1852)     IMPRESSION / MDM / ASSESSMENT AND PLAN / ED COURSE  I reviewed the triage vital signs and the nursing notes.   Differential diagnosis includes, but is not limited to, on is concerned about the  possibly of sepsis given patient's tachycardia and elevated white count.  Sepsis alert was called.  Unclear source given chest x-ray was negative.  We will get CTs evaluate for any pneumonia, PE, CT abdomen to evaluate for pyelonephritis, infected kidney stone, SBO.  Denies any evidence of cellulitis or infection on his feet.  Unclear where the source is coming from at this time.  Labs show slightly elevated BNP but no evidence of edema on the chest x-ray.  Lactate is slightly elevated.  Potassium is slightly low will give some oral repletion.  White count is significantly elevated at 22,000.  COVID and flu are negative.  The patient is on  the cardiac monitor to evaluate for evidence of arrhythmia and/or significant heart rate changes.  Patient was given just a little bit of fluid due to a history of reduced EF of 35 to 40%.   On repeat evaluation his heart rate has come down.  Patient is appearing better.  I gave him some breathing treatments and steroids for his COPD.  CT scans were otherwise reassuring.  Unclear the source of infection but given patient's history and significantly elevated white count with shortness of breath I feel like it is best to bring patient in the hospital for sepsis rule out.  I did cover him with broad-spectrum antibiotics.   FINAL CLINICAL IMPRESSION(S) / ED DIAGNOSES   Final diagnoses:  Sepsis, due to unspecified organism, unspecified whether acute organ dysfunction present [facility](HCC)  COPD with acute exacerbation (HCC)     Rx / DC Orders   ED Discharge Orders     None        Note:  This document was prepared using Dragon voice recognition software and may include unintentional dictation errors.   Concha SeFunke, Dondi Aime E, MD 08/15/21 Ernestina [id number]

## 2021-08-16 LAB — GLUCOSE, CAPILLARY
Glucose-Capillary: 278 mg/dL — ABNORMAL HIGH (ref 70–99)
Glucose-Capillary: 385 mg/dL — ABNORMAL HIGH (ref 70–99)
Glucose-Capillary: 340 mg/dL — ABNORMAL HIGH (ref 70–99)
Glucose-Capillary: 234 mg/dL — ABNORMAL HIGH (ref 70–99)

## 2021-08-16 LAB — HIV ANTIBODY (ROUTINE TESTING W REFLEX): HIV Screen 4th Generation wRfx: NONREACTIVE

## 2021-08-16 LAB — PROCALCITONIN: Procalcitonin: 0.21 ng/mL

## 2021-08-16 LAB — HEMOGLOBIN A1C
Hgb A1c MFr Bld: 7.4 % — ABNORMAL HIGH (ref 4.8–5.6)
Mean Plasma Glucose: 165.68 mg/dL

## 2021-08-16 MED ORDER — GABAPENTIN 300 MG PO CAPS
300.0000 mg | ORAL_CAPSULE | Freq: Every day | ORAL | Status: DC
  Administered 2021-08-16: 300 mg via ORAL
  Filled 2021-08-16: qty 1

## 2021-08-16 MED ORDER — ACETAMINOPHEN 325 MG PO TABS
650.0000 mg | ORAL_TABLET | Freq: Four times a day (QID) | ORAL | Status: DC | PRN
  Administered 2021-08-16: 650 mg via ORAL
  Filled 2021-08-16: qty 2

## 2021-08-16 MED ORDER — TAMSULOSIN HCL 0.4 MG PO CAPS
0.4000 mg | ORAL_CAPSULE | Freq: Every day | ORAL | Status: DC
  Administered 2021-08-17: 0.4 mg via ORAL
  Filled 2021-08-16 (×2): qty 1

## 2021-08-16 MED ORDER — AZITHROMYCIN 250 MG PO TABS
250.0000 mg | ORAL_TABLET | Freq: Every day | ORAL | Status: DC
  Administered 2021-08-17: 250 mg via ORAL
  Filled 2021-08-16: qty 1

## 2021-08-16 MED ORDER — AZITHROMYCIN 250 MG PO TABS
500.0000 mg | ORAL_TABLET | Freq: Every day | ORAL | Status: AC
  Administered 2021-08-16: 500 mg via ORAL
  Filled 2021-08-16: qty 2

## 2021-08-16 MED ORDER — PHENOL 1.4 % MT LIQD
1.0000 | OROMUCOSAL | Status: DC | PRN
  Administered 2021-08-16: 1 via OROMUCOSAL
  Filled 2021-08-16: qty 177

## 2021-08-16 MED ORDER — CLOPIDOGREL BISULFATE 75 MG PO TABS
75.0000 mg | ORAL_TABLET | Freq: Every day | ORAL | Status: DC
  Administered 2021-08-16 – 2021-08-17 (×2): 75 mg via ORAL
  Filled 2021-08-16 (×2): qty 1

## 2021-08-16 MED ORDER — IPRATROPIUM-ALBUTEROL 0.5-2.5 (3) MG/3ML IN SOLN
3.0000 mL | Freq: Two times a day (BID) | RESPIRATORY_TRACT | Status: DC
  Administered 2021-08-16 – 2021-08-17 (×2): 3 mL via RESPIRATORY_TRACT
  Filled 2021-08-16 (×2): qty 3

## 2021-08-16 MED ORDER — FUROSEMIDE 20 MG PO TABS
20.0000 mg | ORAL_TABLET | ORAL | Status: DC
  Administered 2021-08-16: 20 mg via ORAL
  Filled 2021-08-16: qty 1

## 2021-08-16 MED ORDER — ENSURE MAX PROTEIN PO LIQD
11.0000 [oz_av] | Freq: Two times a day (BID) | ORAL | Status: DC
  Filled 2021-08-16: qty 330

## 2021-08-16 NOTE — TOC Initial Note (Signed)
Transition of Care (TOC) - Initial/Assessment Note    Patient Details  Name: Danny [name] Sr. MRN: [id number] Date of Birth: [birth year]  Transition of Care [facility]) CM/SW Contact:    Beverly Sessions, RN Phone Number: 08/16/2021, 2:07 PM  Clinical Narrative:                 Admitted for: COPD Admitted from: Home with son [id number] Current home health/prior home health/DME: WC and shower seat  Therapy recommending home health. Patient agreeable and states that he does not have a preference of home health agency.  Referral made and accepted by [facility] with [place].   RW to be delivered to room prior to discharge by Faythe Dingwall with [employer]    Expected Discharge Plan: [place] Barriers to Discharge: Continued Medical Work up   Patient Goals and CMS Choice   CMS Medicare.gov Compare Post Acute Care list provided to:: Patient Choice offered to / list presented to : Patient  Expected Discharge Plan and Services Expected Discharge Plan: [place]   Discharge Planning Services: CM Consult Post Acute Care Choice: Home Health, Durable Medical Equipment                   DME Arranged: [place] rolling DME Agency: [facility] Date DME Agency Contacted: 08/16/21   Representative spoke with at DME Agency: [place] Arranged: RN, PT, OT [facility] Agency: [place] ([place]) Date HH Agency Contacted: 08/16/21   Representative spoke with at [place]: Corene Cornea  Prior Living Arrangements/Services   Lives with:: Adult Children Patient language and need for interpreter reviewed:: Yes        Need for Family Participation in Patient Care: Yes (Comment) Care giver support system in place?: Yes (comment) Current home services: DME Criminal Activity/Legal Involvement Pertinent to Current Situation/Hospitalization: No - Comment as needed  Activities of Daily Living Home Assistive Devices/Equipment: Other (Comment) ADL Screening (condition  at time of admission) Patient's cognitive ability adequate to safely complete daily activities?: Yes Is the patient deaf or have difficulty hearing?: No Does the patient have difficulty seeing, even when wearing glasses/contacts?: No Does the patient have difficulty concentrating, remembering, or making decisions?: No Patient able to express need for assistance with ADLs?: Yes Does the patient have difficulty dressing or bathing?: No Independently performs ADLs?: Yes (appropriate for developmental age) Does the patient have difficulty walking or climbing stairs?: Yes (L AKA) Weakness of Legs: None Weakness of Arms/Hands: None  Permission Sought/Granted                  Emotional Assessment       Orientation: : Oriented to Self, Oriented to Place, Oriented to  Time, Oriented to Situation Alcohol / Substance Use: Not Applicable Psych Involvement: No (comment)  Admission diagnosis:  COPD with acute exacerbation ([place]) [J44.1] Sepsis, due to unspecified organism, unspecified whether acute organ dysfunction present [facility]) [A41.9] Patient Active Problem List   Diagnosis Date Noted   SIRS (systemic inflammatory response syndrome) ([place]) 08/15/2021   Irregular heart beat 01/24/2021   Hx of BKA, left ([place]) 12/16/2020   Reactive thrombocytosis 123456   Chronic systolic heart failure ([place])    Cellulitis of left foot 11/09/2020   HFrEF (heart failure with reduced ejection fraction) ([place])    Thrush    Sepsis ([place]) 11/06/2020   Gangrene of left foot ([place])    PVD (peripheral vascular disease) ([place])    Stage 2 chronic kidney  disease    Atherosclerosis of native arteries of the extremities with gangrene ([place]) 10/28/2020   Low back pain 06/29/2020   Onychomycosis 03/29/2020   Fall 03/29/2020   AKI (acute kidney injury) ([place]) 12/28/2019   Coronary artery disease of native artery of native heart with stable angina pectoris ([place]) 12/12/2018   Pure hypercholesterolemia 12/12/2018    Chest pain 10/31/2018   Atherosclerosis of native arteries of extremity with intermittent claudication ([place]) 02/11/2018   COPD with acute exacerbation ([place]) 11/26/2017   Decreased pedal pulses 07/02/2017   Orthostasis 05/07/2017   Anxiety and depression 05/07/2017   CKD (chronic kidney disease) stage 3, GFR 30-59 ml/min (HCC) 03/06/2017   Diabetic neuropathy (HCC) 03/06/2017   Chronic diastolic CHF (congestive heart failure) ([place]) 03/05/2017   Morbid obesity ([place]) 06/18/2016   H/O medication noncompliance 06/18/2016   COPD with chronic bronchitis ([place]) 06/20/2015   GERD (gastroesophageal reflux disease) 12/20/2014   Type 2 diabetes mellitus with hyperlipidemia ([place]) 06/05/2010   Hyperlipidemia 02/10/2010   Essential hypertension 02/10/2010   CAD (coronary artery disease) 02/10/2010   PCP:  [name], MD Pharmacy:   [facility] [address] [place] [address] Phone: [phone] Fax: [phone]  [id number] [place], [place] - [address] [place] [address] Phone: [phone] Fax: [phone]     Social Determinants of Health (SDOH) Interventions    Readmission Risk Interventions Readmission Risk Prevention Plan 07/30/2019  Transportation Screening Complete  PCP or Specialist Appt within 5-7 Days Complete  Medication Review (RN CM) Complete  Some recent data might be hidden

## 2021-08-16 NOTE — Evaluation (Signed)
Occupational Therapy Evaluation Patient Details Name: Danny BRANDSMA Sr. MRN: [id number] DOB: 04/15/1951 Today's Date: 08/16/2021   History of Present Illness Patient is a 71 year old male who reports to [facility] with a diagnosis of COPD w/ acute exacerbation. Patient has a PMH (+) for  COPD, HTN, DM, CAD, systolic CHF, PAD s/p BKA 11/2020 during a hospitalization for sepsis secondary to gangrene left foot.   Clinical Impression   [name] was seen for OT evaluation this date. Prior to hospital admission, pt was modified independent with his BADL/IADL management. He endorses using community transportation services for transportation to/from his doctors appointments. Pt lives with his son who is able to provide intermittent assist. Currently pt demonstrates impairments as described below (See OT problem list) which functionally limit his ability to perform ADL/self-care tasks. Pt currently requires MOD A for LB ADL management as well as SUPERVISION for safety with Squat pivot transfer to room recliner.  Pt would benefit from skilled OT services to address noted impairments and functional limitations (see below for any additional details) in order to maximize safety and independence while minimizing falls risk and caregiver burden. Upon hospital discharge, recommend HHOT to maximize pt safety and return to functional independence during meaningful occupations of daily life.       Recommendations for follow up therapy are one component of a multi-disciplinary discharge planning process, led by the attending physician.  Recommendations may be updated based on patient status, additional functional criteria and insurance authorization.   Follow Up Recommendations  Home health OT    Assistance Recommended at Discharge PRN  Patient can return home with the following      Functional Status Assessment  Patient has had a recent decline in their functional status and demonstrates the ability to make  significant improvements in function in a reasonable and predictable amount of time.  Equipment Recommendations  None recommended by OT (Pt has necessary equipment)    Recommendations for Other Services       Precautions / Restrictions Precautions Precautions: Fall Restrictions Weight Bearing Restrictions: No Other Position/Activity Restrictions: s/p R BKA 4/22      Mobility Bed Mobility Overal bed mobility: Modified Independent             General bed mobility comments: Comes to sitting EOB with increased time/effort. HOB elevated.    Transfers Overall transfer level: Needs assistance Equipment used: None Transfers: Bed to chair/wheelchair/BSC     Squat pivot transfers: Supervision       General transfer comment: Performs t/f from EOB to room recliner with set-up/supervision assist. Reports feeling "weaker than usual" with functional transfer.      Balance Overall balance assessment: Needs assistance Sitting-balance support: No upper extremity supported;Feet unsupported Sitting balance-[name]: Normal     Standing balance support: Bilateral upper extremity supported;During functional activity Standing balance-[name]: Poor Standing balance comment: Not tested.                           ADL either performed or assessed with clinical judgement   ADL Overall ADL's : Needs assistance/impaired                                       General ADL Comments: MOD A For LB access, supervision for safety druing functional mobility. Set-up for UB ADL management in setting of decreased activity  tolerance and decreased safety awareness.     Vision Baseline Vision/History: 4 Cataracts (Pt endorses needing cataract sx, but has not scheduled it yet.) Ability to See in Adequate Light: 2 Moderately impaired Patient Visual Report: No change from baseline       Perception     Praxis      Pertinent Vitals/Pain Pain Assessment:  No/denies pain     Hand Dominance Right   Extremity/Trunk Assessment Upper Extremity Assessment Upper Extremity Assessment: Overall WFL for tasks assessed   Lower Extremity Assessment Lower Extremity Assessment: Overall WFL for tasks assessed       Communication Communication Communication: No difficulties   Cognition Arousal/Alertness: Awake/alert Behavior During Therapy: WFL for tasks assessed/performed Overall Cognitive Status: Within Functional Limits for tasks assessed                                 General Comments: Pleasant, conversational, alert and oriented t/o session.     General Comments       Exercises Other Exercises Other Exercises: OT facilitates pt education on safe use of AE/DME for ADL management, role of OT in acute setting, considerations for DC home with home health and routiens modifications to support safety and functional indepenence upon hospital DC. Other Exercises: squat pivot transfer completed SBA, patient was able to verbalize where he wanted the chair to complete transfer independently   Shoulder Instructions      Home Living Family/patient expects to be discharged to:: Private residence Living Arrangements: Children (Son) Available Help at Discharge: Available PRN/intermittently;Family Type of Home: House Home Access: Ramped entrance     Home Layout: One level     Bathroom Shower/Tub: Tub/shower unit;Other (comment) (Songe bathes at baseline.)   Bathroom Toilet: Standard     Home Equipment: Grab bars - toilet;Wheelchair - manual;Shower seat          Prior Functioning/Environment Prior Level of Function : Independent/Modified Independent       Physical Assist : Mobility (physical);ADLs (physical) Mobility (physical): Bed mobility;Transfers ADLs (physical): Bathing;Toileting;Grooming;Feeding;IADLs Mobility Comments: Pt performs SPT to WC at baseline. Reports he recently recieved his prosthetic foot, but has  not had training/PT for functional use. Reliant on [facility] for all household mobility. ADLs Comments: Pt sponge bathes at baseline. Reports he is generally independent for dressing and BADL management. Prepares simple meals and performs IADL management at [facility] level. Uses [employer] transportation.        OT Problem List: Cardiopulmonary status limiting activity;Decreased activity tolerance;Decreased safety awareness;Impaired balance (sitting and/or standing);Decreased knowledge of use of DME or AE      OT Treatment/Interventions: Self-care/ADL training;Therapeutic exercise;Therapeutic activities;Patient/family education;DME and/or AE instruction;Balance training;Energy conservation    OT Goals(Current goals can be found in the care plan section) Acute Rehab OT Goals Patient Stated Goal: To learn to use my prosthetic foot. OT Goal Formulation: With patient Time For Goal Achievement: 08/30/21 Potential to Achieve Goals: Fair  OT Frequency: Min 2X/week    Co-evaluation              AM-PAC OT "6 Clicks" Daily Activity     Outcome Measure Help from another person eating meals?: None Help from another person taking care of personal grooming?: A Little Help from another person toileting, which includes using toliet, bedpan, or urinal?: A Little Help from another person bathing (including washing, rinsing, drying)?: A Little Help from another person to put on and taking off regular  upper body clothing?: A Little Help from another person to put on and taking off regular lower body clothing?: A Little 6 Click Score: 19   End of Session Equipment Utilized During Treatment: Gait belt Nurse Communication: Mobility status (No chair alarm box in toom.)  Activity Tolerance: Patient tolerated treatment well Patient left: in chair;with call bell/phone within reach (No chair alarm box in room, RN Notified. Alarm pad in place in chair.)  OT Visit Diagnosis: Other abnormalities of gait and mobility (R26.89)                 Time: [id number] OT Time Calculation (min): 36 min Charges:  OT General Charges $OT Visit: 1 Visit OT Evaluation $OT Eval Moderate Complexity: 1 Mod OT Treatments $Self Care/Home Management : 23-37 mins  Shara Blazing, M.S., OTR/L [facility] Team - [place] [facility] Ascom: [phone] 08/16/21, 11:58 AM

## 2021-08-16 NOTE — Progress Notes (Signed)
Inpatient Diabetes Program Recommendations  AACE/ADA: New Consensus Statement on Inpatient Glycemic Control   Target Ranges:  Prepandial:   less than 140 mg/dL      Peak postprandial:   less than 180 mg/dL (1-2 hours)      Critically ill patients:  140 - 180 mg/dL    Latest Reference Range & Units 08/15/21 22:10 08/16/21 07:51  Glucose-Capillary 70 - 99 mg/dL 841 (H) 660 (H)   Review of Glycemic Control  Diabetes history: DM2 Outpatient Diabetes medications: Tresiba 40 units daily, Metformin XR 500 mg BID, Jardiance 25 mg daily, Trulicity 4.5 mg Qweek Current orders for Inpatient glycemic control: Jardiance 25 mg daily, Novolog 0-15 units TID with meals, Novolog 0-5 units QHS; Solumedrol 40 mg Q12H (changing to Prednisone 40 mg daily on 08/17/21)  Inpatient Diabetes Program Recommendations:    Insulin: If steroids are continued, please consider ordering Semglee 20 units Q24H.  Thanks, Orlando Penner, RN, MSN, CDE Diabetes Coordinator Inpatient Diabetes Program [phone] (Team Pager from 8am to 5pm)

## 2021-08-16 NOTE — Progress Notes (Signed)
Initial Nutrition Assessment  DOCUMENTATION CODES:   Not applicable  INTERVENTION:   Ensure Max protein supplement BID, each supplement provides 150kcal and 30g of protein.  Double protein with meals   NUTRITION DIAGNOSIS:   Increased nutrient needs related to catabolic illness (COPD) as evidenced by estimated needs.  GOAL:   Patient will meet greater than or equal to 90% of their needs  MONITOR:   PO intake, Supplement acceptance, Labs, Weight trends, Skin, I & O's  REASON FOR ASSESSMENT:   Consult COPD Protocol  ASSESSMENT:   71 y.o. male with medical history significant for COPD, HTN, BPH, DM, CAD, systolic CHF and PAD s/p BKA 11/2020 during a hospitalization for sepsis secondary to gangrene left foot who is admitted with COPD exacerbation.  Met with pt in room today. Pt reports good appetite and oral intake pta and in hospital. Pt reports eating 100% of meals. Pt's main complaint today is that he is not getting enough food. Pt reports that he has been eating a bunch of graham crackers but now his sugar is high. RD discussed with pt the importance of adequate nutrition needed to preserve lean muscle. RD will add protein and shakes and double protein with meals to help with satiety and to help pt meet his estimated needs. Per chart, pt with weight gain pta. Pt reports losing 20lbs while at rehab as he did not like the food but reports that he has re-gained 13lbs since being at home; this is confirmed in chart.   Medications reviewed and include: aspirin, lovenox, insulin, prednisone, aldactone  Labs reviewed: K 3.3(L) Wbc- 22.2(H) Cbgs- 385, 278 x 24 hrs AIC 7.4(H)- 1/10  NUTRITION - FOCUSED PHYSICAL EXAM:  Flowsheet Row Most Recent Value  Orbital Region No depletion  Upper Arm Region Moderate depletion  Thoracic and Lumbar Region No depletion  Buccal Region No depletion  Temple Region No depletion  Clavicle Bone Region No depletion  Clavicle and Acromion Bone  Region No depletion  Scapular Bone Region No depletion  Dorsal Hand No depletion  Patellar Region Moderate depletion  Anterior Thigh Region Moderate depletion  Posterior Calf Region Moderate depletion  Edema (RD Assessment) None  Hair Reviewed  Eyes Reviewed  Mouth Reviewed  Skin Reviewed  Nails Reviewed   Diet Order:   Diet Order             Diet heart healthy/carb modified Room service appropriate? Yes; Fluid consistency: Thin  Diet effective now                  EDUCATION NEEDS:   Education needs have been addressed  Skin:  Skin Assessment: Reviewed RN Assessment  Last BM:  1/9  Height:   Ht Readings from Last 1 Encounters:  08/15/21 6' (1.829 m)    Weight:   Wt Readings from Last 1 Encounters:  08/15/21 97.5 kg    Ideal Body Weight:  80.9 kg  BMI:  Body mass index is 29.16 kg/m.  Estimated Nutritional Needs:   Kcal:  2200-2500kcal/day  Protein:  110-125g/day  Fluid:  2100-2400kcal/day  Koleen Distance MS, RD, LDN Please refer to [facility] for RD and/or RD on-call/weekend/after hours pager

## 2021-08-16 NOTE — Evaluation (Signed)
Physical Therapy Evaluation Patient Details Name: Danny LAUBACHER Sr. MRN: [id number] DOB: [birth year] Today's Date: 08/16/2021  History of Present Illness  Patient is a 71 year old male who reports to [facility] with a diagnosis of COPD w/ acute exacerbation. Patient has a PMH (+) for  COPD, HTN, DM, CAD, systolic CHF, PAD s/p BKA 11/2020 during a hospitalization for sepsis secondary to gangrene left foot.  Clinical Impression  Patient tolerated session well and was agreeable to a limited evaluation. Upon entering room patient was seated in chair ready to take a nap. He was able to verbalize understanding that he can not perform transfer without someone in the room. Prior to hospitalization patient reports he was Mod I with all mobility and ADLs (mainly wheelchair bound, with a newly arrived prosthetic- 32 weeks old). Patient reported no pain throughout session. Patient demonstrated strength in BUE and BLE WFL. Squat pivot transfer from chair to bed was completed Supervision with BUE support from bed and chair to complete. Transfers to/ from wheelchair and wheelchair mobility should be looked at next session. O2 on room air remained 94% throughout entire session. HR: 95-107bpm throughout session Patient would continue to benefit from skilled physical therapy in order to optimize patient return to PLOF. Recommend HHPT following discharge from acute hospitalization.      Recommendations for follow up therapy are one component of a multi-disciplinary discharge planning process, led by the attending physician.  Recommendations may be updated based on patient status, additional functional criteria and insurance authorization.  Follow Up Recommendations Home health PT    Assistance Recommended at Discharge Set up Supervision/Assistance  Patient can return home with the following       Equipment Recommendations Rolling walker (2 wheels)  Recommendations for Other Services       Functional Status Assessment  Patient has had a recent decline in their functional status and demonstrates the ability to make significant improvements in function in a reasonable and predictable amount of time.     Precautions / Restrictions Precautions Precautions: Fall Restrictions Weight Bearing Restrictions: No      Mobility  Bed Mobility Overal bed mobility: Modified Independent                  Transfers Overall transfer level: Needs assistance Equipment used: None Transfers: Bed to chair/wheelchair/BSC       Squat pivot transfers: Supervision     General transfer comment: Completed from chair to EOB with SBA; BUE support on bed and chair for stability    Ambulation/Gait                  Stairs            Wheelchair Mobility    Modified Rankin (Stroke Patients Only)       Balance Overall balance assessment: Needs assistance Sitting-balance support: No upper extremity supported;Feet unsupported Sitting balance-[name]: Normal     Standing balance support: Bilateral upper extremity supported;During functional activity Standing balance-[name]: Poor Standing balance comment: Required SBA to complete squat pivot transfer with mild unsteadiness                             Pertinent Vitals/Pain Pain Assessment: No/denies pain    Home Living Family/patient expects to be discharged to:: Private residence Living Arrangements: Children (son) Available Help at Discharge: Available PRN/intermittently;Family Type of Home: House Home Access: Ramped entrance       Home  Layout: One level Home Equipment: Grab bars - toilet;Wheelchair - manual;Shower seat      Prior Function Prior Level of Function : Independent/Modified Independent       Physical Assist : Mobility (physical);ADLs (physical) Mobility (physical): Bed mobility;Transfers ADLs (physical): Bathing;Toileting;Grooming;Feeding;IADLs         Hand Dominance   Dominant Hand:  Right    Extremity/Trunk Assessment   Upper Extremity Assessment Upper Extremity Assessment: Overall WFL for tasks assessed    Lower Extremity Assessment Lower Extremity Assessment: Overall WFL for tasks assessed       Communication   Communication: No difficulties  Cognition Arousal/Alertness: Awake/alert Behavior During Therapy: WFL for tasks assessed/performed Overall Cognitive Status: Within Functional Limits for tasks assessed                                 General Comments: A&Ox3 to location, situation, and month/year        General Comments      Exercises Other Exercises Other Exercises: patient educated on role of PT, d/c plans, fall risk Other Exercises: squat pivot transfer completed SBA, patient was able to verbalize where he wanted the chair to complete transfer independently   Assessment/Plan    PT Assessment Patient needs continued PT services  PT Problem List Decreased mobility;Decreased activity tolerance;Decreased balance;Decreased knowledge of use of DME       PT Treatment Interventions Therapeutic activities;Wheelchair mobility training;Therapeutic exercise;Patient/family education;Functional mobility training    PT Goals (Current goals can be found in the Care Plan section)  Acute Rehab PT Goals Patient Stated Goal: to go home and learn how to walk with his new prostetic PT Goal Formulation: With patient Time For Goal Achievement: 08/30/21 Potential to Achieve Goals: Good    Frequency Min 2X/week     Co-evaluation               AM-PAC PT "6 Clicks" Mobility  Outcome Measure Help needed turning from your back to your side while in a flat bed without using bedrails?: None Help needed moving from lying on your back to sitting on the side of a flat bed without using bedrails?: None Help needed moving to and from a bed to a chair (including a wheelchair)?: A Little Help needed standing up from a chair using your arms  (e.g., wheelchair or bedside chair)?: A Lot Help needed to walk in hospital room?: A Lot Help needed climbing 3-5 steps with a railing? : A Lot 6 Click Score: 17    End of Session Equipment Utilized During Treatment: Gait belt Activity Tolerance: Patient tolerated treatment well;No increased pain Patient left: in bed;with call bell/phone within reach;with bed alarm set Nurse Communication: Mobility status PT Visit Diagnosis: Unsteadiness on feet (R26.81);Difficulty in walking, not elsewhere classified (R26.2)    Time: 0349-1791 PT Time Calculation (min) (ACUTE ONLY): 15 min   Charges:   PT Evaluation $PT Eval Low Complexity: 1 Low         Angelica Ran, PT  08/16/21. 10:37 AM

## 2021-08-16 NOTE — Progress Notes (Signed)
Progress Note:    Danny KRALICEK Sr.    [id number] DOB: 17-Aug-1950 DOA: 08/15/2021  PCP: [name], MD    Brief Narrative:   71 year old male with a past medical history of COPD hypertension, diabetes mellitus, CAD, systolic CHF, PAD status post left BKA and gangrenous left foot on 11/2020.  This patient presented the emergency department with several days of shortness of breath, wheezing.  Admitted for COPD exacerbation.     Subjective:   Tolerating diet.  No acute events reported overnight.  Currently on room air and saturating well.  Some minimal wheezing which has improved significantly.    Assessment and Plan:   COPD exacerbation: The angiogram showed no PE or focal consolidation or pneumonia.  Did show severe emphysematous changes with mild bronchial thickening of the right lower lobe consistent with acute bronchitis.  Continue IV steroids and scheduled bronchodilators with DuoNebs.  Continue cefepime as there is no evidence of bacterial pneumonia.  Zithromax started for COPD exacerbation.  Systolic CHF: Not in acute decompensation.  Euvolemic.  Continue guideline directed medical therapy with Coreg, Entresto, Aldactone, Imdur, Lasix and Jardiance and relatively well-controlled with A1c 7.4  Diabetes mellitus: Jardiance and sliding scale insulin.  Monitor for steroid-induced hyperglycemia.  On Tresiba, Glucophage-XR and Trulicity on hold  Depression/anxiety: Continue home Lexapro  CAD/PAD status post left BKA: Continue aspirin, Plavix and atorvastatin  Diabetic neuropathy: Continue home Neurontin    Other information:    DVT prophylaxis: Lovenox subcu Code Status: Full code Family Communication: None present at bedside Disposition:   Status is: Inpatient  Remains inpatient appropriate because: Still having wheezing           Consultants:   None    Objective:    Vitals:   08/15/21 2043 08/16/21 0326 08/16/21 0753 08/16/21  1547  BP: (!) 171/98 (!) 144/92 (!) 174/99 (!) 155/97  Pulse: (!) 105 95 86 95  Resp: 20 20    Temp: 98.8 F (37.1 C) 98.3 F (36.8 C) 98.3 F (36.8 C) (!) 97.3 F (36.3 C)  TempSrc: Oral Oral Oral Oral  SpO2: 98% 92% 94% 95%  Weight:      Height:        Intake/Output Summary (Last 24 hours) at 08/16/2021 1638 Last data filed at 08/16/2021 1411 Gross per 24 hour  Intake 580 ml  Output 1050 ml  Net -470 ml   Filed Weights   08/15/21 1514  Weight: 97.5 kg       Physical Exam:    General exam: Appears calm and comfortable  Respiratory system: Mild expiratory wheezing primarily in the upper lobes Cardiovascular system: S1 & S2 heard, RRR. No JVD, murmurs, rubs, gallops or clicks. No pedal edema. Gastrointestinal system: Abdomen is nondistended, soft and nontender. No organomegaly or masses felt. Normal bowel sounds heard. Central nervous system: Alert and oriented. No focal neurological deficits. Extremities: Symmetric 5 x 5 power. Skin: No rashes, lesions or ulcers Psychiatry: Judgement and insight appear normal. Mood & affect appropriate.     Data Reviewed:    I have personally reviewed following labs and imaging studies  CBC: Recent Labs  Lab 08/15/21 1517  WBC 22.2*  NEUTROABS 17.2*  HGB 16.0  HCT 49.0  MCV 93.2  PLT 0000000    Basic Metabolic Panel: Recent Labs  Lab 08/15/21 1517  NA 139  K 3.3*  CL 106  CO2 23  GLUCOSE 165*  BUN 21  CREATININE 1.11  CALCIUM  8.8*    GFR: Estimated Creatinine Clearance: 75 mL/min (by C-G formula based on SCr of 1.11 mg/dL).  Liver Function Tests: Recent Labs  Lab 08/15/21 1517  AST 20  ALT 21  ALKPHOS 106  BILITOT 0.7  PROT 7.2  ALBUMIN 3.9    CBG: Recent Labs  Lab 08/15/21 2210 08/16/21 0751 08/16/21 1146 08/16/21 1547  GLUCAP 264* 278* 385* 340*     Recent Results (from the past 240 hour(s))  Resp Panel by RT-PCR (Flu A&B, Covid) Nasopharyngeal Swab     Status: None   Collection Time:  08/15/21  3:17 PM   Specimen: Nasopharyngeal Swab; Nasopharyngeal(NP) swabs in vial transport medium  Result Value Ref Range Status   SARS Coronavirus 2 by RT PCR NEGATIVE NEGATIVE Final    Comment: (NOTE) SARS-CoV-2 target nucleic acids are NOT DETECTED.  The SARS-CoV-2 RNA is generally detectable in upper respiratory specimens during the acute phase of infection. The lowest concentration of SARS-CoV-2 viral copies this assay can detect is 138 copies/mL. A negative result does not preclude SARS-Cov-2 infection and should not be used as the sole basis for treatment or other patient management decisions. A negative result may occur with  improper specimen collection/handling, submission of specimen other than nasopharyngeal swab, presence of viral mutation(s) within the areas targeted by this assay, and inadequate number of viral copies(<138 copies/mL). A negative result must be combined with clinical observations, patient history, and epidemiological information. The expected result is Negative.  Fact Sheet for Patients:  EntrepreneurPulse.com.au  Fact Sheet for Healthcare Providers:  IncredibleEmployment.be  This test is no t yet approved or cleared by the [place] FDA and  has been authorized for detection and/or diagnosis of SARS-CoV-2 by FDA under an Emergency Use Authorization (EUA). This EUA will remain  in effect (meaning this test can be used) for the duration of the COVID-19 declaration under Section 564(b)(1) of the Act, 21 U.S.C.section 360bbb-3(b)(1), unless the authorization is terminated  or revoked sooner.       Influenza A by PCR NEGATIVE NEGATIVE Final   Influenza B by PCR NEGATIVE NEGATIVE Final    Comment: (NOTE) The Xpert Xpress SARS-CoV-2/FLU/RSV plus assay is intended as an aid in the diagnosis of influenza from Nasopharyngeal swab specimens and should not be used as a sole basis for treatment. Nasal washings  and aspirates are unacceptable for Xpert Xpress SARS-CoV-2/FLU/RSV testing.  Fact Sheet for Patients: EntrepreneurPulse.com.au  Fact Sheet for Healthcare Providers: IncredibleEmployment.be  This test is not yet approved or cleared by the [place] FDA and has been authorized for detection and/or diagnosis of SARS-CoV-2 by FDA under an Emergency Use Authorization (EUA). This EUA will remain in effect (meaning this test can be used) for the duration of the COVID-19 declaration under Section 564(b)(1) of the Act, 21 U.S.C. section 360bbb-3(b)(1), unless the authorization is terminated or revoked.  Performed at [facility], [address] [place], [place] [address]   Blood culture (routine x 2)     Status: None (Preliminary result)   Collection Time: 08/15/21  6:29 PM   Specimen: BLOOD  Result Value Ref Range Status   Specimen Description BLOOD BLOOD RIGHT FOREARM  Final   Special Requests   Final    BOTTLES DRAWN AEROBIC AND ANAEROBIC Blood Culture adequate volume   Culture   Final    NO GROWTH < 12 HOURS Performed at [facility], [address], [place], [place] [address]    Report Status PENDING  Incomplete  Blood culture (routine x 2)     Status: None (Preliminary result)   Collection Time: 08/15/21  6:29 PM   Specimen: BLOOD  Result Value Ref Range Status   Specimen Description BLOOD BLOOD LEFT FOREARM  Final   Special Requests   Final    BOTTLES DRAWN AEROBIC AND ANAEROBIC Blood Culture adequate volume   Culture   Final    NO GROWTH < 12 HOURS Performed at [facility] [address] [place], [place] [address]    Report Status PENDING  Incomplete         Radiology Studies:    DG Chest 2 View  Result Date: 08/15/2021 CLINICAL DATA:  Shortness of breath EXAM: CHEST - 2 VIEW COMPARISON:  11/06/2020 FINDINGS: Normal cardiac and mediastinal contours. Redemonstrated elevation of the right  hemidiaphragm. Persistent streaky opacities in the right lung, likely scarring. No focal pulmonary opacity. No pleural effusion. No acute osseous abnormality. IMPRESSION: No active cardiopulmonary disease. Electronically Signed   By: Merilyn Baba M.D.   On: 08/15/2021 15:56   CT Angio Chest PE W and/or Wo Contrast  Result Date: 08/15/2021 CLINICAL DATA:  Headache cough and fever for 2 3 days. EXAM: CT ANGIOGRAPHY CHEST WITH CONTRAST TECHNIQUE: Multidetector CT imaging of the chest was performed using the standard protocol during bolus administration of intravenous contrast. Multiplanar CT image reconstructions and MIPs were obtained to evaluate the vascular anatomy. RADIATION DOSE REDUCTION: This exam was performed according to the departmental dose-optimization program which includes automated exposure control, adjustment of the mA and/or kV according to patient size and/or use of iterative reconstruction technique. CONTRAST:  143mL OMNIPAQUE IOHEXOL 350 MG/ML SOLN COMPARISON:  Chest radiograph performed earlier on the same date. FINDINGS: Cardiovascular: Satisfactory opacification of the pulmonary arteries to the segmental level. No evidence of pulmonary embolism. Normal heart size. Coronary artery atherosclerotic calcifications. No pericardial effusion. Mediastinum/Nodes: No enlarged mediastinal, hilar, or axillary lymph nodes. Thyroid gland, trachea, and esophagus demonstrate no significant findings. Lungs/Pleura: Severe centrilobular and paraseptal emphysematous changes. Mild fibrotic changes in the right lung base. Mild bronchial thickening of the right lower lobe bronchi concerning for bronchitis. No focal consolidation. Upper Abdomen: No acute abnormality. Musculoskeletal: Diffuse idiopathic skeletal hyperostosis. No acute osseous abnormality. Review of the MIP images confirms the above findings. IMPRESSION: 1.  No evidence of pulmonary embolism. 2.  Severe emphysematous changes of bilateral upper  lobes. 3. Mild bronchial thickening of the right lower lobe bronchi concerning for bronchitis. No focal consolidation or pneumonia. Electronically Signed   By: Keane Police D.O.   On: 08/15/2021 19:05   CT ABDOMEN PELVIS W CONTRAST  Result Date: 08/15/2021 CLINICAL DATA:  Abdomen pain fever EXAM: CT ABDOMEN AND PELVIS WITH CONTRAST TECHNIQUE: Multidetector CT imaging of the abdomen and pelvis was performed using the standard protocol following bolus administration of intravenous contrast. CONTRAST:  113mL OMNIPAQUE IOHEXOL 350 MG/ML SOLN COMPARISON:  CT 08/17/2016 FINDINGS: Lower chest: Lung bases demonstrate mild right lower lobe bronchiectasis with bronchial wall thickening. Emphysema. No acute airspace disease. Coronary vascular calcification Hepatobiliary: No focal liver abnormality is seen. No gallstones, gallbladder wall thickening, or biliary dilatation. Pancreas: Unremarkable. No pancreatic ductal dilatation or surrounding inflammatory changes. Spleen: Normal in size without focal abnormality. Adrenals/Urinary Tract: Adrenal glands are normal. Kidneys show no hydronephrosis. Cyst upper pole left kidney. The bladder is normal Stomach/Bowel: The stomach is nonenlarged. No dilated small bowel. Negative appendix. No acute bowel wall thickening Vascular/Lymphatic: Advanced aortic atherosclerosis. No aneurysm. No  suspicious lymph nodes Reproductive: Prostate is unremarkable. Other: Negative for pelvic effusion or free air. Small fat containing inguinal hernias. Musculoskeletal: No acute or suspicious osseous abnormality IMPRESSION: 1. No CT evidence for acute intra-abdominal or pelvic abnormality. 2. Emphysema at the lung bases Electronically Signed   By: Donavan Foil M.D.   On: 08/15/2021 19:03        Medications:    Scheduled Meds:  aspirin  81 mg Oral Daily   atorvastatin  80 mg Oral QHS   [START ON 08/17/2021] azithromycin  250 mg Oral Daily   carvedilol  12.5 mg Oral BID WC   empagliflozin   25 mg Oral Daily   enoxaparin (LOVENOX) injection  40 mg Subcutaneous Q24H   escitalopram  10 mg Oral Daily   insulin aspart  0-15 Units Subcutaneous TID WC   insulin aspart  0-5 Units Subcutaneous QHS   ipratropium-albuterol  3 mL Nebulization BID   isosorbide mononitrate  30 mg Oral Daily   methylPREDNISolone (SOLU-MEDROL) injection  40 mg Intravenous Q12H   Followed by   Derrill Memo ON 08/17/2021] predniSONE  40 mg Oral Q breakfast   Ensure Max Protein  11 oz Oral BID   sacubitril-valsartan  1 tablet Oral BID   spironolactone  25 mg Oral Daily   Continuous Infusions:     LOS: 1 day    Time spent: 35 minutes 35 minutes    Leslee Home, MD [facility]   To contact the attending provider between 7A-7P or the covering provider during after hours 7P-7A, please log into the web site [email or web address] and access using universal [place] password for that web site. If you do not have the password, please call the hospital operator.  08/16/2021, 4:38 PM

## 2021-08-17 LAB — BASIC METABOLIC PANEL
Anion gap: 10 (ref 5–15)
BUN: 46 mg/dL — ABNORMAL HIGH (ref 8–23)
CO2: 21 mmol/L — ABNORMAL LOW (ref 22–32)
Calcium: 9 mg/dL (ref 8.9–10.3)
Chloride: 106 mmol/L (ref 98–111)
Creatinine, Ser: 1.44 mg/dL — ABNORMAL HIGH (ref 0.61–1.24)
GFR, Estimated: 52 mL/min — ABNORMAL LOW (ref >60)
Glucose, Bld: 254 mg/dL — ABNORMAL HIGH (ref 70–99)
Potassium: 3.9 mmol/L (ref 3.5–5.1)
Sodium: 137 mmol/L (ref 135–145)

## 2021-08-17 LAB — PROCALCITONIN: Procalcitonin: 0.2 ng/mL

## 2021-08-17 LAB — CBC
HCT: 46.1 % (ref 39.0–52.0)
Hemoglobin: 15 g/dL (ref 13.0–17.0)
MCH: 30.2 pg (ref 26.0–34.0)
MCHC: 32.5 g/dL (ref 30.0–36.0)
MCV: 92.8 fL (ref 80.0–100.0)
Platelets: 240 10*3/uL (ref 150–400)
RBC: 4.97 MIL/uL (ref 4.22–5.81)
RDW: 13.2 % (ref 11.5–15.5)
WBC: 19.2 10*3/uL — ABNORMAL HIGH (ref 4.0–10.5)
nRBC: 0 % (ref 0.0–0.2)

## 2021-08-17 LAB — GLUCOSE, CAPILLARY: Glucose-Capillary: 193 mg/dL — ABNORMAL HIGH (ref 70–99)

## 2021-08-17 MED ORDER — PREDNISONE 20 MG PO TABS: 40.0000 mg | ORAL_TABLET | Freq: Every day | ORAL | 0 refills | Status: AC

## 2021-08-17 MED ORDER — AZITHROMYCIN 250 MG PO TABS: 250.0000 mg | ORAL_TABLET | Freq: Every day | ORAL | 0 refills | Status: AC

## 2021-08-17 NOTE — Progress Notes (Incomplete)
Inpatient Diabetes Program Recommendations  AACE/ADA: New Consensus Statement on Inpatient Glycemic Control (2015)  Target Ranges:  Prepandial:   less than 140 mg/dL      Peak postprandial:   less than 180 mg/dL (1-2 hours)      Critically ill patients:  140 - 180 mg/dL    Latest Reference Range & Units 08/16/21 07:51 08/16/21 11:46 08/16/21 15:47 08/16/21 20:49  Glucose-Capillary 70 - 99 mg/dL 071 (H)  8 units Novolog  385 (H)  15 units Novolog  340 (H)  11 units Novolog  234 (H)  2 units Novolog   (H): Data is abnormally high     Home DM Meds: Tresiba 40 units daily      Metformin XR 500 mg daily     Jardiance 25 mg daily     Trulicity 4.5 mg Qweek   Current Orders: Jardiance 25 mg daily      Novolog Moderate Correction Scale/ SSI (0-15 units) TID AC + HS     Note Solumedrol stopped--last dose given yesterday at 9pm To start Prednisone 40 mg daily today

## 2021-08-17 NOTE — TOC Transition Note (Signed)
Transition of Care [facility]) - CM/SW Discharge Note   Patient Details  Name: Danny VU Sr. MRN: [id number] Date of Birth: 01-08-1951  Transition of Care [facility]) CM/SW Contact:  Chapman Fitch, RN Phone Number: 08/17/2021, 10:02 AM   Clinical Narrative:     Patient to discharge today.  RW and WC to be delivered to room [place] with [facility] notified of discharge  Family to transport   Final next level of care: Home w Home Health Services Barriers to Discharge: Barriers Resolved   Patient Goals and CMS Choice   CMS Medicare.gov Compare Post Acute Care list provided to:: Patient Choice offered to / list presented to : Patient  Discharge Placement                       Discharge Plan and Services   Discharge Planning Services: CM Consult Post Acute Care Choice: Home Health, Durable Medical Equipment          DME Arranged: Wheelchair manual, Walker rolling DME Agency: [employer] Date DME Agency Contacted: 08/16/21   Representative spoke with at DME Agency: Pamelia Hoit HH Arranged: RN, PT, OT [facility] Agency: [facility] Date HH Agency Contacted: 08/16/21   Representative spoke with at [facility] Agency: Barbara Cower  Social Determinants of Health (SDOH) Interventions     Readmission Risk Interventions Readmission Risk Prevention Plan 07/30/2019  Transportation Screening Complete  PCP or Specialist Appt within 5-7 Days Complete  Medication Review (RN CM) Complete  Some recent data might be hidden

## 2021-08-17 NOTE — Discharge Summary (Addendum)
Discharge Summary:   Danny LOGAN Sr.   ZOX:[id number] DOB: 12-23-1950 DOA: 08/15/2021  PCP: [name], MD  Admit date: 08/15/2021 Discharge date: 08/17/2021  Admitted From: Home Disposition:  Home with HHS   Home Health: PT/OT/SN Equipment/Devices: RW and WC Discharge Condition:Improved and stable CODE STATUS: Full code Diet recommendation: Low sodium/CC   Hospital Course:   71 year old male with a past medical history of COPD hypertension, diabetes mellitus, CAD, systolic CHF, PAD status post left BKA and gangrenous left foot on 11/2020.  This patient presented the emergency department with several days of shortness of breath, wheezing.  Admitted for COPD exacerbation. Was treated with IV steroids and Zithromax as well as scheduled bronchodilators. Passed home oxygen screening prior to discharge and will not require any chronic oxygen supplementation. PT/OT evaluated the patient. Recommended HHPT/OT. DME with RW and WC will be provided. He will be discharged on a few days of oral steroids. Follow up with PCP upon discharge. Of note the patient did have a WBC with no signs of infection. CT imaging of the chest, abdomen, pelvis showed no acute findings. WBC is trending down upon discharge. No fevers.    Discharge Diagnoses:   COPD exacerbation, sepsis has been ruled out Acute bronchitis, ruled out Diabetes mellitus type 2 with hyperglycemia due to steroids Systolic CHF, not in acute decompensation Depression/Anxiety CAD/PAD status post left BKA Diabetic neuropathy  Discharge Instructions:    Discharge Instructions     Call MD for:   Complete by: As directed    Shortness of breath   Call MD for:  temperature >100.4   Complete by: As directed    Diet - low sodium heart healthy   Complete by: As directed    Diet Carb Modified   Complete by: As directed    Discharge instructions   Complete by: As directed    1) Follow up with PCP in 1 week   Increase  activity slowly   Complete by: As directed       Allergies as of 08/17/2021   No Known Allergies      Medication List     TAKE these medications    albuterol (2.5 MG/3ML) 0.083% nebulizer solution Commonly known as: PROVENTIL Take 3 mLs (2.5 mg total) by nebulization every 6 (six) hours as needed for wheezing or shortness of breath.   Ventolin HFA 108 (90 Base) MCG/ACT inhaler Generic drug: albuterol Inhale 2 puffs into the lungs every 6 (six) hours as needed for wheezing or shortness of breath.   aspirin 81 MG tablet Take 81 mg by mouth daily.   atorvastatin 80 MG tablet Commonly known as: LIPITOR TAKE 1 TABLET BY MOUTH EVERY DAY   azithromycin 250 MG tablet Commonly known as: ZITHROMAX Take 1 tablet (250 mg total) by mouth daily for 4 days.   carvedilol 12.5 MG tablet Commonly known as: COREG Take 1 tablet (12.5 mg total) by mouth 2 (two) times daily with a meal.   clopidogrel 75 MG tablet Commonly known as: PLAVIX TAKE 1 TABLET BY MOUTH EVERY DAY   Entresto 49-51 MG Generic drug: sacubitril-valsartan TAKE 1 TABLET BY MOUTH 2 (TWO) TIMES DAILY. STOP LISINOPRIL AND POTASSIUM   escitalopram 10 MG tablet Commonly known as: LEXAPRO TAKE 1 TABLET BY MOUTH EVERY DAY   fluticasone-salmeterol 250-50 MCG/ACT Aepb Commonly known as: ADVAIR Inhale 1 puff into the lungs 2 (two) times daily.   FreeStyle Libre 2 Sensor Misc by Does not apply route.  furosemide 20 MG tablet Commonly known as: LASIX TAKE 1 TABLET (20 MG) BY MOUTH ONCE EVERY OTHER DAY   gabapentin 300 MG capsule Commonly known as: Neurontin Take 1 capsule (300 mg total) by mouth at bedtime.   isosorbide mononitrate 30 MG 24 hr tablet Commonly known as: IMDUR TAKE 1 TABLET BY MOUTH EVERY DAY   Jardiance 25 MG Tabs tablet Generic drug: empagliflozin TAKE 1 TABLET BY MOUTH DAILY   metFORMIN 500 MG 24 hr tablet Commonly known as: GLUCOPHAGE-XR Take 1 tablet (500 mg total) by mouth 2 (two) times  daily with a meal. What changed: when to take this   predniSONE 20 MG tablet Commonly known as: DELTASONE Take 2 tablets (40 mg total) by mouth daily with breakfast for 5 days. Start taking on: August 18, 2021   spironolactone 25 MG tablet Commonly known as: ALDACTONE Take 1 tablet (25 mg total) by mouth daily.   tamsulosin 0.4 MG Caps capsule Commonly known as: FLOMAX Take 1 capsule (0.4 mg total) by mouth daily.   Evaristo Buryresiba FlexTouch 100 UNIT/ML FlexTouch Pen Generic drug: insulin degludec Inject 40 Units into the skin daily.   Trulicity 4.5 MG/0.5ML Sopn Generic drug: Dulaglutide INJECT 4.5 MG AS DIRECTED ONCE A WEEK.               Durable Medical Equipment  (From admission, onward)           Start     Ordered   08/17/21 0847  For home use only DME lightweight manual wheelchair with seat cushion  Once       Comments: Patient suffers from bka which impairs their ability to perform daily activities like dressing and toileting in the home.  A cane will not resolve  issue with performing activities of daily living. A wheelchair will allow patient to safely perform daily activities. Patient is not able to propel themselves in the home using a standard weight wheelchair due to general weakness. Patient can self propel in the lightweight wheelchair. Length of need Lifetime. Accessories: elevating leg rests (ELRs), wheel locks, extensions and anti-tippers.   08/17/21 0847   08/17/21 0846  For home use only DME Walker rolling  Once       Question Answer Comment  Walker: With 5 Inch Wheels   Patient needs a walker to treat with the following condition Weakness      08/17/21 0847            Follow-up Information     Glori LuisSonnenberg, Eric G, MD. Call in 1 week(s).   Specialty: Family Medicine Contact information: [address] [place] [address] [phone]         Antonieta IbaGollan, Timothy J, MD .   Specialty: Cardiology Contact information: [address] [place] [address] [phone]                No Known Allergies    Consultations:   None   Procedures/Studies:   DG Chest 2 View  Result Date: 08/15/2021 CLINICAL DATA:  Shortness of breath EXAM: CHEST - 2 VIEW COMPARISON:  11/06/2020 FINDINGS: Normal cardiac and mediastinal contours. Redemonstrated elevation of the right hemidiaphragm. Persistent streaky opacities in the right lung, likely scarring. No focal pulmonary opacity. No pleural effusion. No acute osseous abnormality. IMPRESSION: No active cardiopulmonary disease. Electronically Signed   By: Wiliam KeAlison  Vasan M.D.   On: 08/15/2021 15:56   CT Angio Chest PE W and/or Wo Contrast  Result Date:  08/15/2021 CLINICAL DATA:  Headache cough and fever for 2 3 days. EXAM: CT ANGIOGRAPHY CHEST WITH CONTRAST TECHNIQUE: Multidetector CT imaging of the chest was performed using the standard protocol during bolus administration of intravenous contrast. Multiplanar CT image reconstructions and MIPs were obtained to evaluate the vascular anatomy. RADIATION DOSE REDUCTION: This exam was performed according to the departmental dose-optimization program which includes automated exposure control, adjustment of the mA and/or kV according to patient size and/or use of iterative reconstruction technique. CONTRAST:  OMNIPAQUE IOHEXOL 350 MG/ML SOLN COMPARISON:  Chest radiograph performed earlier on the same date. FINDINGS: Cardiovascular: Satisfactory opacification of the pulmonary arteries to the segmental level. No evidence of pulmonary embolism. Normal heart size. Coronary artery atherosclerotic calcifications. No pericardial effusion. Mediastinum/Nodes: No enlarged mediastinal, hilar, or axillary lymph nodes. Thyroid gland, trachea, and esophagus demonstrate no significant findings. Lungs/Pleura: Severe centrilobular and paraseptal emphysematous changes. Mild fibrotic changes in the right lung base. Mild bronchial  thickening of the right lower lobe bronchi concerning for bronchitis. No focal consolidation. Upper Abdomen: No acute abnormality. Musculoskeletal: Diffuse idiopathic skeletal hyperostosis. No acute osseous abnormality. Review of the MIP images confirms the above findings. IMPRESSION: 1.  No evidence of pulmonary embolism. 2.  Severe emphysematous changes of bilateral upper lobes. 3. Mild bronchial thickening of the right lower lobe bronchi concerning for bronchitis. No focal consolidation or pneumonia. Electronically Signed   By: Larose Hires D.O.   On: 08/15/2021 19:05   CT ABDOMEN PELVIS W CONTRAST  Result Date: 08/15/2021 CLINICAL DATA:  Abdomen pain fever EXAM: CT ABDOMEN AND PELVIS WITH CONTRAST TECHNIQUE: Multidetector CT imaging of the abdomen and pelvis was performed using the standard protocol following bolus administration of intravenous contrast. CONTRAST:  OMNIPAQUE IOHEXOL 350 MG/ML SOLN COMPARISON:  CT 08/17/2016 FINDINGS: Lower chest: Lung bases demonstrate mild right lower lobe bronchiectasis with bronchial wall thickening. Emphysema. No acute airspace disease. Coronary vascular calcification Hepatobiliary: No focal liver abnormality is seen. No gallstones, gallbladder wall thickening, or biliary dilatation. Pancreas: Unremarkable. No pancreatic ductal dilatation or surrounding inflammatory changes. Spleen: Normal in size without focal abnormality. Adrenals/Urinary Tract: Adrenal glands are normal. Kidneys show no hydronephrosis. Cyst upper pole left kidney. The bladder is normal Stomach/Bowel: The stomach is nonenlarged. No dilated small bowel. Negative appendix. No acute bowel wall thickening Vascular/Lymphatic: Advanced aortic atherosclerosis. No aneurysm. No suspicious lymph nodes Reproductive: Prostate is unremarkable. Other: Negative for pelvic effusion or free air. Small fat containing inguinal hernias. Musculoskeletal: No acute or suspicious osseous abnormality IMPRESSION: 1. No CT  evidence for acute intra-abdominal or pelvic abnormality. 2. Emphysema at the lung bases Electronically Signed   By: Jasmine Pang M.D.   On: 08/15/2021 19:03       Discharge Exam:   Vitals:   08/17/21 0403 08/17/21 0810  BP: (!) 153/92 (!) 158/106  Pulse: 91 93  Resp: 18 19  Temp: 97.8 F (36.6 C) (!) 97.5 F (36.4 C)  SpO2: 93% 96%   Vitals:   08/16/21 1957 08/16/21 2014 08/17/21 0403 08/17/21 0810  BP:  (!) 159/96 (!) 153/92 (!) 158/106  Pulse:  89 91 93  Resp:  18 18 19   Temp:  98.3 F (36.8 C) 97.8 F (36.6 C) (!) 97.5 F (36.4 C)  TempSrc:    Oral  SpO2: 94% 94% 93% 96%  Weight:      Height:        General: Pt is alert, awake, not in acute distress Cardiovascular: RRR, S1/S2 +, no  rubs, no gallops Respiratory: CTA bilaterally, no wheezing, no rhonchi Abdominal: Soft, NT, ND, bowel sounds + Extremities: no edema, no cyanosis, left BKA    The results of significant diagnostics from this hospitalization (including imaging, microbiology, ancillary and laboratory) are listed below for reference.     Microbiology:   Recent Results (from the past 240 hour(s))  Resp Panel by RT-PCR (Flu A&B, Covid) Nasopharyngeal Swab     Status: None   Collection Time: 08/15/21  3:17 PM   Specimen: Nasopharyngeal Swab; Nasopharyngeal(NP) swabs in vial transport medium  Result Value Ref Range Status   SARS Coronavirus 2 by RT PCR NEGATIVE NEGATIVE Final    Comment: (NOTE) SARS-CoV-2 target nucleic acids are NOT DETECTED.  The SARS-CoV-2 RNA is generally detectable in upper respiratory specimens during the acute phase of infection. The lowest concentration of SARS-CoV-2 viral copies this assay can detect is 138 copies/mL. A negative result does not preclude SARS-Cov-2 infection and should not be used as the sole basis for treatment or other patient management decisions. A negative result may occur with  improper specimen collection/handling, submission of specimen other than  nasopharyngeal swab, presence of viral mutation(s) within the areas targeted by this assay, and inadequate number of viral copies(<138 copies/mL). A negative result must be combined with clinical observations, patient history, and epidemiological information. The expected result is Negative.  Fact Sheet for Patients:  BloggerCourse.com  Fact Sheet for Healthcare Providers:  SeriousBroker.it  This test is no t yet approved or cleared by the [place] FDA and  has been authorized for detection and/or diagnosis of SARS-CoV-2 by FDA under an Emergency Use Authorization (EUA). This EUA will remain  in effect (meaning this test can be used) for the duration of the COVID-19 declaration under Section 564(b)(1) of the Act, 21 U.S.C.section 360bbb-3(b)(1), unless the authorization is terminated  or revoked sooner.       Influenza A by PCR NEGATIVE NEGATIVE Final   Influenza B by PCR NEGATIVE NEGATIVE Final    Comment: (NOTE) The Xpert Xpress SARS-CoV-2/FLU/RSV plus assay is intended as an aid in the diagnosis of influenza from Nasopharyngeal swab specimens and should not be used as a sole basis for treatment. Nasal washings and aspirates are unacceptable for Xpert Xpress SARS-CoV-2/FLU/RSV testing.  Fact Sheet for Patients: BloggerCourse.com  Fact Sheet for Healthcare Providers: SeriousBroker.it  This test is not yet approved or cleared by the [place] FDA and has been authorized for detection and/or diagnosis of SARS-CoV-2 by FDA under an Emergency Use Authorization (EUA). This EUA will remain in effect (meaning this test can be used) for the duration of the COVID-19 declaration under Section 564(b)(1) of the Act, 21 U.S.C. section 360bbb-3(b)(1), unless the authorization is terminated or revoked.  Performed at [facility], [address] [place], [place]  [address]   Blood culture (routine x 2)     Status: None (Preliminary result)   Collection Time: 08/15/21  6:29 PM   Specimen: BLOOD  Result Value Ref Range Status   Specimen Description BLOOD BLOOD RIGHT FOREARM  Final   Special Requests   Final    BOTTLES DRAWN AEROBIC AND ANAEROBIC Blood Culture adequate volume   Culture   Final    NO GROWTH 2 DAYS Performed at [facility], [address] [place] [address], [place], [place] [address]    Report Status PENDING  Incomplete  Blood culture (routine x 2)     Status: None (Preliminary result)   Collection Time: 08/15/21  6:29 PM   Specimen: BLOOD  Result Value Ref Range Status   Specimen Description BLOOD BLOOD LEFT FOREARM  Final   Special Requests   Final    BOTTLES DRAWN AEROBIC AND ANAEROBIC Blood Culture adequate volume   Culture   Final    NO GROWTH 2 DAYS Performed at [facility], [address], [place], [place] [address]    Report Status PENDING  Incomplete       Labs:   BNP (last 3 results) Recent Labs    10/26/20 1048 08/15/21 1517  BNP 94.0 239.9*   Basic Metabolic Panel: Recent Labs  Lab 08/15/21 1517 08/17/21 0357  NA 139 137  K 3.3* 3.9  CL 106 106  CO2 23 21*  GLUCOSE 165* 254*  BUN 21 46*  CREATININE 1.11 1.44*  CALCIUM 8.8* 9.0   Liver Function Tests: Recent Labs  Lab 08/15/21 1517  AST 20  ALT 21  ALKPHOS 106  BILITOT 0.7  PROT 7.2  ALBUMIN 3.9   No results for input(s): LIPASE, AMYLASE in the last 168 hours. No results for input(s): AMMONIA in the last 168 hours. CBC: Recent Labs  Lab 08/15/21 1517 08/17/21 0357  WBC 22.2* 19.2*  NEUTROABS 17.2*  --   HGB 16.0 15.0  HCT 49.0 46.1  MCV 93.2 92.8  PLT 246 240   Cardiac Enzymes: No results for input(s): CKTOTAL, CKMB, CKMBINDEX, TROPONINI in the last 168 hours. BNP: Invalid input(s): POCBNP CBG: Recent Labs  Lab 08/16/21 0751 08/16/21 1146 08/16/21 1547 08/16/21 2049 08/17/21 0811  GLUCAP 278* 385* 340* 234* 193*    D-Dimer No results for input(s): DDIMER in the last 72 hours. Hgb A1c Recent Labs    08/15/21 2200  HGBA1C 7.4*   Lipid Profile No results for input(s): CHOL, HDL, LDLCALC, TRIG, CHOLHDL, LDLDIRECT in the last 72 hours. Thyroid function studies No results for input(s): TSH, T4TOTAL, T3FREE, THYROIDAB in the last 72 hours.  Invalid input(s): FREET3 Anemia work up No results for input(s): VITAMINB12, FOLATE, FERRITIN, TIBC, IRON, RETICCTPCT in the last 72 hours. Urinalysis    Component Value Date/Time   COLORURINE YELLOW (A) 11/15/2020 1605   APPEARANCEUR CLEAR (A) 11/15/2020 1605   APPEARANCEUR Clear 11/23/2014 2124   LABSPEC 1.016 11/15/2020 1605   LABSPEC 1.025 11/23/2014 2124   PHURINE 5.0 11/15/2020 1605   GLUCOSEU 50 (A) 11/15/2020 1605   GLUCOSEU >=500 11/23/2014 2124   HGBUR SMALL (A) 11/15/2020 1605   BILIRUBINUR NEGATIVE 11/15/2020 1605   BILIRUBINUR Negative 11/23/2014 2124   KETONESUR NEGATIVE 11/15/2020 1605   PROTEINUR NEGATIVE 11/15/2020 1605   NITRITE NEGATIVE 11/15/2020 1605   LEUKOCYTESUR NEGATIVE 11/15/2020 1605   LEUKOCYTESUR Negative 11/23/2014 2124   Sepsis Labs Invalid input(s): PROCALCITONIN,  WBC,  LACTICIDVEN Microbiology Recent Results (from the past 240 hour(s))  Resp Panel by RT-PCR (Flu A&B, Covid) Nasopharyngeal Swab     Status: None   Collection Time: 08/15/21  3:17 PM   Specimen: Nasopharyngeal Swab; Nasopharyngeal(NP) swabs in vial transport medium  Result Value Ref Range Status   SARS Coronavirus 2 by RT PCR NEGATIVE NEGATIVE Final    Comment: (NOTE) SARS-CoV-2 target nucleic acids are NOT DETECTED.  The SARS-CoV-2 RNA is generally detectable in upper respiratory specimens during the acute phase of infection. The lowest concentration of SARS-CoV-2 viral copies this assay can detect is 138 copies/mL. A negative result does not preclude SARS-Cov-2 infection and should not be used as the sole basis for treatment or other  patient  management decisions. A negative result may occur with  improper specimen collection/handling, submission of specimen other than nasopharyngeal swab, presence of viral mutation(s) within the areas targeted by this assay, and inadequate number of viral copies(<138 copies/mL). A negative result must be combined with clinical observations, patient history, and epidemiological information. The expected result is Negative.  Fact Sheet for Patients:  BloggerCourse.comhttps://www.fda.gov/media/152166/download  Fact Sheet for Healthcare Providers:  SeriousBroker.ithttps://www.fda.gov/media/152162/download  This test is no t yet approved or cleared by the [place] FDA and  has been authorized for detection and/or diagnosis of SARS-CoV-2 by FDA under an Emergency Use Authorization (EUA). This EUA will remain  in effect (meaning this test can be used) for the duration of the COVID-19 declaration under Section 564(b)(1) of the Act, 21 U.S.C.section 360bbb-3(b)(1), unless the authorization is terminated  or revoked sooner.       Influenza A by PCR NEGATIVE NEGATIVE Final   Influenza B by PCR NEGATIVE NEGATIVE Final    Comment: (NOTE) The Xpert Xpress SARS-CoV-2/FLU/RSV plus assay is intended as an aid in the diagnosis of influenza from Nasopharyngeal swab specimens and should not be used as a sole basis for treatment. Nasal washings and aspirates are unacceptable for Xpert Xpress SARS-CoV-2/FLU/RSV testing.  Fact Sheet for Patients: BloggerCourse.comhttps://www.fda.gov/media/152166/download  Fact Sheet for Healthcare Providers: SeriousBroker.ithttps://www.fda.gov/media/152162/download  This test is not yet approved or cleared by the [place] FDA and has been authorized for detection and/or diagnosis of SARS-CoV-2 by FDA under an Emergency Use Authorization (EUA). This EUA will remain in effect (meaning this test can be used) for the duration of the COVID-19 declaration under Section 564(b)(1) of the Act, 21 U.S.C. section 360bbb-3(b)(1),  unless the authorization is terminated or revoked.  Performed at [facility], [address] [place], [place] [address]   Blood culture (routine x 2)     Status: None (Preliminary result)   Collection Time: 08/15/21  6:29 PM   Specimen: BLOOD  Result Value Ref Range Status   Specimen Description BLOOD BLOOD RIGHT FOREARM  Final   Special Requests   Final    BOTTLES DRAWN AEROBIC AND ANAEROBIC Blood Culture adequate volume   Culture   Final    NO GROWTH 2 DAYS Performed at [facility], [address] [place], [place] [address]    Report Status PENDING  Incomplete  Blood culture (routine x 2)     Status: None (Preliminary result)   Collection Time: 08/15/21  6:29 PM   Specimen: BLOOD  Result Value Ref Range Status   Specimen Description BLOOD BLOOD LEFT FOREARM  Final   Special Requests   Final    BOTTLES DRAWN AEROBIC AND ANAEROBIC Blood Culture adequate volume   Culture   Final    NO GROWTH 2 DAYS Performed at [facility], [address] [place], [place] [address]    Report Status PENDING  Incomplete     Time coordinating discharge: 45 minutes  SIGNED:   Verdia KubaShayan S Dalal Livengood, MD  [facility] 08/17/2021, 9:46 AM Pager   If 7PM-7AM, please contact night-coverage [email or web address] Password [email or web address]

## 2021-08-17 NOTE — Discharge Instructions (Signed)
1) Follow up with PCP in 1 week

## 2021-08-18 ENCOUNTER — Telehealth: Payer: Self-pay

## 2021-08-18 NOTE — Telephone Encounter (Signed)
Transition Care Management Follow-up Telephone Call Date of discharge and from where: 08/17/21 [facility] How have you been since you were released from the hospital? "I am getting better. I take my time and take breaks when I need to move around." Denies fever, shortness of breath, chest pain and all other symptoms.  Any questions or concerns? No  Items Reviewed: Did the pt receive and understand the discharge instructions provided? Yes  Medications obtained and verified? Yes  Any new allergies since your discharge? No  Dietary orders reviewed? Yes Do you have support at home? Yes  Home Care and Equipment/Supplies: Were home health services ordered? Yes home with HHS, HHPT/OT, in progress.   Functional Questionnaire: (I = Independent and D = Dependent) ADLs: Family or aide assist as needed. Mostly independent.   Bathing/Dressing- I  Meal Prep- I  Eating- I  Maintaining continence- I  Transferring/Ambulation- rolling walker, wheelchair  Managing Meds- I  Follow up appointments reviewed:  PCP Hospital f/u appt confirmed? Yes  Scheduled to see Dr. Lonie Peak on 08/24/21 @ 10:00. Specialist Hospital f/u appt confirmed? Yes  Scheduled to see Cardiology. Agrees to schedule. Are transportation arrangements needed? No  If their condition worsens, is the pt aware to call PCP or go to the Emergency Dept.? Yes Was the patient provided with contact information for the PCP's office or ED? Yes Was to pt encouraged to call back with questions or concerns? Yes

## 2021-08-20 LAB — CULTURE, BLOOD (ROUTINE X 2)
Culture: NO GROWTH
Culture: NO GROWTH
Special Requests: ADEQUATE
Special Requests: ADEQUATE

## 2021-08-24 ENCOUNTER — Ambulatory Visit (INDEPENDENT_AMBULATORY_CARE_PROVIDER_SITE_OTHER): Payer: Medicare Other | Admitting: Internal Medicine

## 2021-08-24 ENCOUNTER — Other Ambulatory Visit: Payer: Self-pay

## 2021-08-24 ENCOUNTER — Telehealth: Payer: Self-pay | Admitting: Family Medicine

## 2021-08-24 ENCOUNTER — Encounter: Payer: Self-pay | Admitting: Internal Medicine

## 2021-08-24 ENCOUNTER — Telehealth: Payer: Self-pay | Admitting: Internal Medicine

## 2021-08-24 VITALS — BP 108/70 | HR 98 | Temp 96.3°F | Ht 72.0 in | Wt 215.0 lb

## 2021-08-24 DIAGNOSIS — J432 Centrilobular emphysema: Secondary | ICD-10-CM

## 2021-08-24 DIAGNOSIS — R0789 Other chest pain: Secondary | ICD-10-CM

## 2021-08-24 DIAGNOSIS — Z23 Encounter for immunization: Secondary | ICD-10-CM | POA: Diagnosis not present

## 2021-08-24 LAB — TROPONIN I (HIGH SENSITIVITY): High Sens Troponin I: 19 ng/L (ref 2–17)

## 2021-08-24 MED ORDER — TRELEGY ELLIPTA 100-62.5-25 MCG/ACT IN AEPB: 1.0000 | INHALATION_SPRAY | Freq: Every day | RESPIRATORY_TRACT | 0 refills | Status: DC

## 2021-08-24 MED ORDER — PROAIR RESPICLICK 108 (90 BASE) MCG/ACT IN AEPB: 1.0000 | INHALATION_SPRAY | Freq: Four times a day (QID) | RESPIRATORY_TRACT | 12 refills | Status: DC | PRN

## 2021-08-24 NOTE — Telephone Encounter (Signed)
Pt had chest pain 2 days ago  Troponin elevated trending down consider going back to ED if actively having chest pain  Appt cardiolgy 08/30/21 trying to move up sooner than 08/30/21 but pt has transportation issues   Dr. French Ana McLean-Scocuzza

## 2021-08-24 NOTE — Telephone Encounter (Signed)
°  CRITICAL VALUE STICKER  CRITICAL VALUE: TROPONIN 19  RECEIVER Henrene Pastor RN  DATE & TIME NOTIFIED: 12:39 08/24/2021  MESSENGER ([facility]) [facility]  MD NOTIFIED: Judie Grieve  TIME OF NOTIFICATION: 12:40 : RESPONSE:

## 2021-08-24 NOTE — Telephone Encounter (Signed)
Elder Cyphers Pt  (Physical Therapist) called in stating that Pt had an appt with Dr. Olivia Mackie this morning. Jeani Hawking stated that Pt was given a sample medication called (trelegy). Jeani Hawking stated that there was a note on the medication stating for the Pt to take 1 Puff Daily & Rinse Mouth. Jeani Hawking stated after the first set of direction, it was hard to read the doctor hand writing. Jeani Hawking would like for someone to advise her of the correct directions. Pt/Lynn requesting a callback at [phone]

## 2021-08-24 NOTE — Progress Notes (Addendum)
Chief Complaint  Patient presents with   Hospitalization Follow-up   HFU Sepsis due to COPD exacerbation 1/10-1/12/23 feeling better had zpack x 4 days at discharge and steroids finished yesterday on advair needs refill albuterol  Former smoker no longer smoking  Left leg amputation needs handicap renewal tag  Atypical chest pain appt cards 08/30/21 will check heart enzyme today 2 days ago had to take ntg and helped  Review of Systems  Respiratory:  Positive for shortness of breath.        Sob with exertion  Cardiovascular:  Positive for chest pain.  Past Medical History:  Diagnosis Date   CAD (coronary artery disease)    a. 01/2010 PCI of LAD; b. 09/2014 PCI/DES of mLAD due to ISR. D1 80, D2 80(jailed), LCX 21m; c. 07/2016 NSTEMI/Cath: LM nl, LAD 27m ISR, 40d, D1 90ost, 80p, D2 90ost, RI min irregs, LCX min irregs, OM1 90 small, OM2/3 min irregs, RCA min irregs, RPLB1 90, EF 25-35%.   Chronic combined systolic and diastolic CHF (congestive heart failure) ([place])    a. 07/2016 Echo: EF 30%, severe septal/anterior HK, Gr1 DD.   CKD (chronic kidney disease), stage III (HCC)    COPD (chronic obstructive pulmonary disease) (HCC)    DM2 (diabetes mellitus, type 2) (HCC)    Erectile dysfunction    HLD (hyperlipidemia)    HTN (hypertension)    Ischemic cardiomyopathy    a. 07/2016 Echo: EF 30% w/ sev septal/ant HK. Gr1 DD.   Past Surgical History:  Procedure Laterality Date   AMPUTATION Left 11/10/2020   Procedure: AMPUTATION BELOW KNEE;  Surgeon: [name], MD;  Location: [facility];  Service: General;  Laterality: Left;   BELOW KNEE LEG AMPUTATION     CAD: stent to the LAD     CARDIAC CATHETERIZATION  10/01/2014   CARDIAC CATHETERIZATION N/A 07/23/2016   Procedure: Left Heart Cath and Coronary Angiography;  Surgeon: [name], MD;  Location: [facility];  Service: Cardiovascular;  Laterality: N/A;   CORONARY ANGIOPLASTY WITH STENT PLACEMENT  10/01/2014   LOWER EXTREMITY  ANGIOGRAPHY Left 10/31/2020   Procedure: LOWER EXTREMITY ANGIOGRAPHY;  Surgeon: [name], MD;  Location: [facility];  Service: Cardiovascular;  Laterality: Left;   Family History  Problem Relation Age of Onset   Heart attack Father        complications   Cancer Brother    Social History   Socioeconomic History   Marital status: Single    Spouse name: Not on file   Number of children: Not on file   Years of education: Not on file   Highest education level: Not on file  Occupational History   Not on file  Tobacco Use   Smoking status: Former    Packs/day: 3.00    Years: 0.00    Pack years: 0.00    Types: Cigarettes    Quit date: 09/07/2009    Years since quitting: 11.9   Smokeless tobacco: Never   Tobacco comments:    quit june 2011  Vaping Use   Vaping Use: Never used  Substance and Sexual Activity   Alcohol use: Yes    Comment: every other weekend   Drug use: No   Sexual activity: Not on file  Other Topics Concern   Not on file  Social History Narrative   Not on file   Social Determinants of Health   Financial Resource Strain: Low Risk    Difficulty of  Paying Living Expenses: Not hard at all  Food Insecurity: No Food Insecurity   Worried About [place] in the Last Year: Never true   Ran Out of Food in the Last Year: Never true  Transportation Needs: Unmet Transportation Needs   Lack of Transportation (Medical): Yes   Lack of Transportation (Non-Medical): Yes  Physical Activity: Not on file  Stress: No Stress Concern Present   Feeling of Stress : Only a little  Social Connections: Unknown   Frequency of Communication with Friends and Family: Not on file   Frequency of Social Gatherings with Friends and Family: More than three times a week   Attends Religious Services: Not on Electrical engineer or Organizations: Not on file   Attends Archivist Meetings: Not on file   Marital Status: Not on file  Intimate Partner  Violence: Not At Risk   Fear of Current or Ex-Partner: No   Emotionally Abused: No   Physically Abused: No   Sexually Abused: No   Current Meds  Medication Sig   albuterol (PROVENTIL) (2.5 MG/3ML) 0.083% nebulizer solution Take 3 mLs (2.5 mg total) by nebulization every 6 (six) hours as needed for wheezing or shortness of breath.   Albuterol Sulfate (PROAIR RESPICLICK) 123XX123 (90 Base) MCG/ACT AEPB Inhale 1 puff into the lungs every 6 (six) hours as needed.   aspirin 81 MG tablet Take 81 mg by mouth daily.   atorvastatin (LIPITOR) 80 MG tablet TAKE 1 TABLET BY MOUTH EVERY DAY   carvedilol (COREG) 12.5 MG tablet Take 1 tablet (12.5 mg total) by mouth 2 (two) times daily with a meal.   clopidogrel (PLAVIX) 75 MG tablet TAKE 1 TABLET BY MOUTH EVERY DAY   Continuous Blood Gluc Sensor (FREESTYLE LIBRE 2 SENSOR) MISC by Does not apply route.   ENTRESTO 49-51 MG TAKE 1 TABLET BY MOUTH 2 (TWO) TIMES DAILY. STOP LISINOPRIL AND POTASSIUM   escitalopram (LEXAPRO) 10 MG tablet TAKE 1 TABLET BY MOUTH EVERY DAY   fluticasone-salmeterol (ADVAIR) 250-50 MCG/ACT AEPB Inhale 1 puff into the lungs 2 (two) times daily.   furosemide (LASIX) 20 MG tablet TAKE 1 TABLET (20 MG) BY MOUTH ONCE EVERY OTHER DAY   insulin degludec (TRESIBA FLEXTOUCH) 100 UNIT/ML FlexTouch Pen Inject 40 Units into the skin daily.   isosorbide mononitrate (IMDUR) 30 MG 24 hr tablet TAKE 1 TABLET BY MOUTH EVERY DAY   JARDIANCE 25 MG TABS tablet TAKE 1 TABLET BY MOUTH DAILY   metFORMIN (GLUCOPHAGE-XR) 500 MG 24 hr tablet Take 1 tablet (500 mg total) by mouth 2 (two) times daily with a meal. (Patient taking differently: Take 500 mg by mouth daily with breakfast.)   spironolactone (ALDACTONE) 25 MG tablet Take 1 tablet (25 mg total) by mouth daily.   tamsulosin (FLOMAX) 0.4 MG CAPS capsule Take 1 capsule (0.4 mg total) by mouth daily.   TRULICITY 4.5 0000000 SOPN INJECT 4.5 MG AS DIRECTED ONCE A WEEK.   [DISCONTINUED] VENTOLIN HFA 108 (90  Base) MCG/ACT inhaler Inhale 2 puffs into the lungs every 6 (six) hours as needed for wheezing or shortness of breath.   No Known Allergies Recent Results (from the past 2160 hour(s))  Lactic acid, plasma     Status: Abnormal   Collection Time: 08/15/21  3:17 PM  Result Value Ref Range   Lactic Acid, Venous 2.3 (HH) 0.5 - 1.9 mmol/L    Comment: CRITICAL RESULT CALLED TO, READ BACK BY AND VERIFIED  WITH AMY COYNE 08/15/21 1547 MU Performed at [facility], [address] [place], [place] [address]   Comprehensive metabolic panel     Status: Abnormal   Collection Time: 08/15/21  3:17 PM  Result Value Ref Range   Sodium 139 135 - 145 mmol/L   Potassium 3.3 (L) 3.5 - 5.1 mmol/L   Chloride 106 98 - 111 mmol/L   CO2 23 22 - 32 mmol/L   Glucose, Bld 165 (H) 70 - 99 mg/dL    Comment: Glucose reference range applies only to samples taken after fasting for at least 8 hours.   BUN 21 8 - 23 mg/dL   Creatinine, Ser 1.11 0.61 - 1.24 mg/dL   Calcium 8.8 (L) 8.9 - 10.3 mg/dL   Total Protein 7.2 6.5 - 8.1 g/dL   Albumin 3.9 3.5 - 5.0 g/dL   AST 20 15 - 41 U/L   ALT 21 0 - 44 U/L   Alkaline Phosphatase 106 38 - 126 U/L   Total Bilirubin 0.7 0.3 - 1.2 mg/dL   GFR, Estimated >60 >60 mL/min    Comment: (NOTE) Calculated using the CKD-EPI Creatinine Equation (2021)    Anion gap 10 5 - 15    Comment: Performed at [facility], [address] [place], [place] [address]  CBC with Differential     Status: Abnormal   Collection Time: 08/15/21  3:17 PM  Result Value Ref Range   WBC 22.2 (H) 4.0 - 10.5 K/uL   RBC 5.26 4.22 - 5.81 MIL/uL   Hemoglobin 16.0 13.0 - 17.0 g/dL   HCT 49.0 39.0 - 52.0 %   MCV 93.2 80.0 - 100.0 fL   MCH 30.4 26.0 - 34.0 pg   MCHC 32.7 30.0 - 36.0 g/dL   RDW 13.0 11.5 - 15.5 %   Platelets 246 150 - 400 K/uL   nRBC 0.0 0.0 - 0.2 %   Neutrophils Relative % 77 %   Neutro Abs 17.2 (H) 1.7 - 7.7 K/uL   Lymphocytes Relative 10 %   Lymphs Abs 2.2 0.7 - 4.0  K/uL   Monocytes Relative 11 %   Monocytes Absolute 2.4 (H) 0.1 - 1.0 K/uL   Eosinophils Relative 1 %   Eosinophils Absolute 0.1 0.0 - 0.5 K/uL   Basophils Relative 0 %   Basophils Absolute 0.1 0.0 - 0.1 K/uL   Immature Granulocytes 1 %   Abs Immature Granulocytes 0.13 (H) 0.00 - 0.07 K/uL    Comment: Performed at [facility], [address], [place], [place] [address]  Resp Panel by RT-PCR (Flu A&B, Covid) Nasopharyngeal Swab     Status: None   Collection Time: 08/15/21  3:17 PM   Specimen: Nasopharyngeal Swab; Nasopharyngeal(NP) swabs in vial transport medium  Result Value Ref Range   SARS Coronavirus 2 by RT PCR NEGATIVE NEGATIVE    Comment: (NOTE) SARS-CoV-2 target nucleic acids are NOT DETECTED.  The SARS-CoV-2 RNA is generally detectable in upper respiratory specimens during the acute phase of infection. The lowest concentration of SARS-CoV-2 viral copies this assay can detect is 138 copies/mL. A negative result does not preclude SARS-Cov-2 infection and should not be used as the sole basis for treatment or other patient management decisions. A negative result may occur with  improper specimen collection/handling, submission of specimen other than nasopharyngeal swab, presence of viral mutation(s) within the areas targeted by this assay, and inadequate number of viral copies(<138 copies/mL). A negative result must be combined with  clinical observations, patient history, and epidemiological information. The expected result is Negative.  Fact Sheet for Patients:  BloggerCourse.com  Fact Sheet for Healthcare Providers:  SeriousBroker.it  This test is no t yet approved or cleared by the [place] FDA and  has been authorized for detection and/or diagnosis of SARS-CoV-2 by FDA under an Emergency Use Authorization (EUA). This EUA will remain  in effect (meaning this test can be used) for the duration of  the COVID-19 declaration under Section 564(b)(1) of the Act, 21 U.S.C.section 360bbb-3(b)(1), unless the authorization is terminated  or revoked sooner.       Influenza A by PCR NEGATIVE NEGATIVE   Influenza B by PCR NEGATIVE NEGATIVE    Comment: (NOTE) The Xpert Xpress SARS-CoV-2/FLU/RSV plus assay is intended as an aid in the diagnosis of influenza from Nasopharyngeal swab specimens and should not be used as a sole basis for treatment. Nasal washings and aspirates are unacceptable for Xpert Xpress SARS-CoV-2/FLU/RSV testing.  Fact Sheet for Patients: BloggerCourse.com  Fact Sheet for Healthcare Providers: SeriousBroker.it  This test is not yet approved or cleared by the [place] FDA and has been authorized for detection and/or diagnosis of SARS-CoV-2 by FDA under an Emergency Use Authorization (EUA). This EUA will remain in effect (meaning this test can be used) for the duration of the COVID-19 declaration under Section 564(b)(1) of the Act, 21 U.S.C. section 360bbb-3(b)(1), unless the authorization is terminated or revoked.  Performed at [facility], [address] [place], [place] [address]   Troponin I (High Sensitivity)     Status: Abnormal   Collection Time: 08/15/21  3:17 PM  Result Value Ref Range   Troponin I (High Sensitivity) 26 (H) <18 ng/L    Comment: (NOTE) Elevated high sensitivity troponin I (hsTnI) values and significant  changes across serial measurements may suggest ACS but many other  chronic and acute conditions are known to elevate hsTnI results.  Refer to the "Links" section for chest pain algorithms and additional  guidance. Performed at [facility], [address] [place], [place] [address]   Brain natriuretic peptide     Status: Abnormal   Collection Time: 08/15/21  3:17 PM  Result Value Ref Range   B Natriuretic Peptide 239.9 (H) 0.0 - 100.0 pg/mL    Comment:  Performed at [facility], [address] [place], [place] [address]  Lactic acid, plasma     Status: Abnormal   Collection Time: 08/15/21  6:08 PM  Result Value Ref Range   Lactic Acid, Venous 2.3 (HH) 0.5 - 1.9 mmol/L    Comment: CRITICAL VALUE NOTED. VALUE IS CONSISTENT WITH PREVIOUSLY REPORTED/CALLED VALUE MU Performed at [facility], [address] [place], [place] [address]   Troponin I (High Sensitivity)     Status: Abnormal   Collection Time: 08/15/21  6:08 PM  Result Value Ref Range   Troponin I (High Sensitivity) 27 (H) <18 ng/L    Comment: (NOTE) Elevated high sensitivity troponin I (hsTnI) values and significant  changes across serial measurements may suggest ACS but many other  chronic and acute conditions are known to elevate hsTnI results.  Refer to the "Links" section for chest pain algorithms and additional  guidance. Performed at [facility], [address] [place], [place] [address]   Blood culture (routine x 2)     Status: None   Collection Time: 08/15/21  6:29 PM   Specimen: BLOOD  Result Value Ref Range  Specimen Description BLOOD BLOOD RIGHT FOREARM    Special Requests      BOTTLES DRAWN AEROBIC AND ANAEROBIC Blood Culture adequate volume   Culture      NO GROWTH 5 DAYS Performed at [facility], [address] [place], [place] [address]    Report Status 08/20/2021 FINAL   Blood culture (routine x 2)     Status: None   Collection Time: 08/15/21  6:29 PM   Specimen: BLOOD  Result Value Ref Range   Specimen Description BLOOD BLOOD LEFT FOREARM    Special Requests      BOTTLES DRAWN AEROBIC AND ANAEROBIC Blood Culture adequate volume   Culture      NO GROWTH 5 DAYS Performed at [facility], [address], [place], [place] [address]    Report Status 08/20/2021 FINAL   Procalcitonin - Baseline     Status: None   Collection Time: 08/15/21 10:00 PM  Result Value Ref Range   Procalcitonin 0.15  ng/mL    Comment:        Interpretation: PCT (Procalcitonin) <= 0.5 ng/mL: Systemic infection (sepsis) is not likely. Local bacterial infection is possible. (NOTE)       Sepsis PCT Algorithm           Lower Respiratory Tract                                      Infection PCT Algorithm    ----------------------------     ----------------------------         PCT < 0.25 ng/mL                PCT < 0.10 ng/mL          Strongly encourage             Strongly discourage   discontinuation of antibiotics    initiation of antibiotics    ----------------------------     -----------------------------       PCT 0.25 - 0.50 ng/mL            PCT 0.10 - 0.25 ng/mL               OR       >80% decrease in PCT            Discourage initiation of                                            antibiotics      Encourage discontinuation           of antibiotics    ----------------------------     -----------------------------         PCT >= 0.50 ng/mL              PCT 0.26 - 0.50 ng/mL               AND        <80% decrease in PCT             Encourage initiation of  antibiotics       Encourage continuation           of antibiotics    ----------------------------     -----------------------------        PCT >= 0.50 ng/mL                  PCT > 0.50 ng/mL               AND         increase in PCT                  Strongly encourage                                      initiation of antibiotics    Strongly encourage escalation           of antibiotics                                     -----------------------------                                           PCT <= 0.25 ng/mL                                                 OR                                        > 80% decrease in PCT                                      Discontinue / Do not initiate                                             antibiotics  Performed at [facility], [address] [place], [place] [address]   Hemoglobin A1c     Status: Abnormal   Collection Time: 08/15/21 10:00 PM  Result Value Ref Range   Hgb A1c MFr Bld 7.4 (H) 4.8 - 5.6 %    Comment: (NOTE) Pre diabetes:          5.7%-6.4%  Diabetes:              >6.4%  Glycemic control for   <7.0% adults with diabetes    Mean Plasma Glucose 165.68 mg/dL    Comment: Performed at [facility] [address] [place], [place] [address]  HIV Antibody (routine testing w rflx)     Status: None   Collection Time: 08/15/21 10:00 PM  Result Value Ref Range   HIV Screen 4th Generation wRfx Non Reactive Non Reactive    Comment: Performed at [facility], [place] [address] [place], [place] [address]  Glucose, capillary  Status: Abnormal   Collection Time: 08/15/21 10:10 PM  Result Value Ref Range   Glucose-Capillary 264 (H) 70 - 99 mg/dL    Comment: Glucose reference range applies only to samples taken after fasting for at least 8 hours.  Procalcitonin     Status: None   Collection Time: 08/16/21  4:20 AM  Result Value Ref Range   Procalcitonin 0.21 ng/mL    Comment:        Interpretation: PCT (Procalcitonin) <= 0.5 ng/mL: Systemic infection (sepsis) is not likely. Local bacterial infection is possible. (NOTE)       Sepsis PCT Algorithm           Lower Respiratory Tract                                      Infection PCT Algorithm    ----------------------------     ----------------------------         PCT < 0.25 ng/mL                PCT < 0.10 ng/mL          Strongly encourage             Strongly discourage   discontinuation of antibiotics    initiation of antibiotics    ----------------------------     -----------------------------       PCT 0.25 - 0.50 ng/mL            PCT 0.10 - 0.25 ng/mL               OR       >80% decrease in PCT            Discourage initiation of                                            antibiotics      Encourage discontinuation           of antibiotics     ----------------------------     -----------------------------         PCT >= 0.50 ng/mL              PCT 0.26 - 0.50 ng/mL               AND        <80% decrease in PCT             Encourage initiation of                                             antibiotics       Encourage continuation           of antibiotics    ----------------------------     -----------------------------        PCT >= 0.50 ng/mL                  PCT > 0.50 ng/mL               AND         increase in PCT  Strongly encourage                                      initiation of antibiotics    Strongly encourage escalation           of antibiotics                                     -----------------------------                                           PCT <= 0.25 ng/mL                                                 OR                                        > 80% decrease in PCT                                      Discontinue / Do not initiate                                             antibiotics  Performed at [facility], [address] [place], [place] [address]   Glucose, capillary     Status: Abnormal   Collection Time: 08/16/21  7:51 AM  Result Value Ref Range   Glucose-Capillary 278 (H) 70 - 99 mg/dL    Comment: Glucose reference range applies only to samples taken after fasting for at least 8 hours.  Glucose, capillary     Status: Abnormal   Collection Time: 08/16/21 11:46 AM  Result Value Ref Range   Glucose-Capillary 385 (H) 70 - 99 mg/dL    Comment: Glucose reference range applies only to samples taken after fasting for at least 8 hours.   Comment 1 Notify RN   Glucose, capillary     Status: Abnormal   Collection Time: 08/16/21  3:47 PM  Result Value Ref Range   Glucose-Capillary 340 (H) 70 - 99 mg/dL    Comment: Glucose reference range applies only to samples taken after fasting for at least 8 hours.  Glucose, capillary     Status: Abnormal   Collection Time:  08/16/21  8:49 PM  Result Value Ref Range   Glucose-Capillary 234 (H) 70 - 99 mg/dL    Comment: Glucose reference range applies only to samples taken after fasting for at least 8 hours.  Procalcitonin     Status: None   Collection Time: 08/17/21  3:57 AM  Result Value Ref Range   Procalcitonin 0.20 ng/mL    Comment:        Interpretation: PCT (Procalcitonin) <= 0.5 ng/mL: Systemic infection (sepsis) is not likely. Local bacterial infection is possible. (NOTE)  Sepsis PCT Algorithm           Lower Respiratory Tract                                      Infection PCT Algorithm    ----------------------------     ----------------------------         PCT < 0.25 ng/mL                PCT < 0.10 ng/mL          Strongly encourage             Strongly discourage   discontinuation of antibiotics    initiation of antibiotics    ----------------------------     -----------------------------       PCT 0.25 - 0.50 ng/mL            PCT 0.10 - 0.25 ng/mL               OR       >80% decrease in PCT            Discourage initiation of                                            antibiotics      Encourage discontinuation           of antibiotics    ----------------------------     -----------------------------         PCT >= 0.50 ng/mL              PCT 0.26 - 0.50 ng/mL               AND        <80% decrease in PCT             Encourage initiation of                                             antibiotics       Encourage continuation           of antibiotics    ----------------------------     -----------------------------        PCT >= 0.50 ng/mL                  PCT > 0.50 ng/mL               AND         increase in PCT                  Strongly encourage                                      initiation of antibiotics    Strongly encourage escalation           of antibiotics                                     -----------------------------  PCT <=  0.25 ng/mL                                                 OR                                        > 80% decrease in PCT                                      Discontinue / Do not initiate                                             antibiotics  Performed at [facility], [address] [place], [place] [address]   CBC     Status: Abnormal   Collection Time: 08/17/21  3:57 AM  Result Value Ref Range   WBC 19.2 (H) 4.0 - 10.5 K/uL   RBC 4.97 4.22 - 5.81 MIL/uL   Hemoglobin 15.0 13.0 - 17.0 g/dL   HCT 46.1 39.0 - 52.0 %   MCV 92.8 80.0 - 100.0 fL   MCH 30.2 26.0 - 34.0 pg   MCHC 32.5 30.0 - 36.0 g/dL   RDW 13.2 11.5 - 15.5 %   Platelets 240 150 - 400 K/uL   nRBC 0.0 0.0 - 0.2 %    Comment: Performed at [facility], [address] [place], [place] [id number]  Basic metabolic panel     Status: Abnormal   Collection Time: 08/17/21  3:57 AM  Result Value Ref Range   Sodium 137 135 - 145 mmol/L   Potassium 3.9 3.5 - 5.1 mmol/L   Chloride 106 98 - 111 mmol/L   CO2 21 (L) 22 - 32 mmol/L   Glucose, Bld 254 (H) 70 - 99 mg/dL    Comment: Glucose reference range applies only to samples taken after fasting for at least 8 hours.   BUN 46 (H) 8 - 23 mg/dL   Creatinine, Ser 1.44 (H) 0.61 - 1.24 mg/dL   Calcium 9.0 8.9 - 10.3 mg/dL   GFR, Estimated 52 (L) >60 mL/min    Comment: (NOTE) Calculated using the CKD-EPI Creatinine Equation (2021)    Anion gap 10 5 - 15    Comment: Performed at [facility], [address] [place], [place] [address]  Glucose, capillary     Status: Abnormal   Collection Time: 08/17/21  8:11 AM  Result Value Ref Range   Glucose-Capillary 193 (H) 70 - 99 mg/dL    Comment: Glucose reference range applies only to samples taken after fasting for at least 8 hours.   Comment 1 Notify RN    Comment 2 Document in Chart    Objective  Body mass index is 29.16 kg/m. Wt Readings from Last 3 Encounters:  08/24/21 215 lb (97.5 kg)   08/15/21 215 lb (97.5 kg)  02/23/21 202 lb (91.6 kg)   Temp Readings from Last 3 Encounters:  08/24/21 (!) 96.3 F (35.7 C) (Temporal)  08/17/21 (!) 97.5 F (36.4 C) (Oral)  01/24/21  97.8 F (36.6 C) (Oral)   BP Readings from Last 3 Encounters:  08/24/21 108/70  08/17/21 (!) 158/106  04/28/21 140/77   Pulse Readings from Last 3 Encounters:  08/24/21 98  08/17/21 93  04/28/21 92    Physical Exam Vitals and nursing note reviewed.  Constitutional:      Appearance: Normal appearance. He is well-developed and well-groomed. He is obese.  HENT:     Head: Normocephalic and atraumatic.  Eyes:     Conjunctiva/sclera: Conjunctivae normal.     Pupils: Pupils are equal, round, and reactive to light.  Cardiovascular:     Rate and Rhythm: Normal rate and regular rhythm.     Heart sounds: Normal heart sounds.  Pulmonary:     Effort: Pulmonary effort is normal. No respiratory distress.     Breath sounds: Normal breath sounds.  Abdominal:     Tenderness: There is no abdominal tenderness.  Musculoskeletal:     Lumbar back: Tenderness present. Negative right straight leg raise test and negative left straight leg raise test.  Skin:    General: Skin is warm and moist.  Neurological:     General: No focal deficit present.     Mental Status: He is alert and oriented to person, place, and time. Mental status is at baseline.     Sensory: Sensation is intact.     Motor: Motor function is intact.     Coordination: Coordination is intact.     Gait: Gait is intact. Gait normal.  Psychiatric:        Attention and Perception: Attention and perception normal.        Mood and Affect: Mood and affect normal.        Speech: Speech normal.        Behavior: Behavior normal. Behavior is cooperative.        Thought Content: Thought content normal.        Cognition and Memory: Cognition and memory normal.        Judgment: Judgment normal.    Assessment  Plan  Centrilobular emphysema ([place]me) -  Plan: Albuterol Sulfate (PROAIR RESPICLICK) [id number] (90 Base) MCG/ACT AEPB Given 2 boxes Trelegy to use instead of advair Prn albuteorl  Resolved  Needs flu shot - Plan: Flu Vaccine QUAD High Dose(Fluad)  Atypical chest pain - Plan: Troponin If/u cards 08/30/21 will see if appt can get moved sooner   F/u with PCP as sch Handicap placard given  Rec pcp disc lipid, cmet, cbc, a1c, vitamin D, psa and colonoscopy at f/u 10/03/21   Provider: Dr. Olivia Mackie McLean-Scocuzza-[facility]

## 2021-08-24 NOTE — Patient Instructions (Addendum)
Pulse oximeter from [facility] $10 goal oxygen greater than 90 Consider pulmonology in the future  Use trelegy instead of advair for now and get pharmacy to show you how to use inhalers    Fluticasone; Umeclidinium; Vilanterol inhalation powder What is this medication? FLUTICASONE; UMECLIDINIUM; VILANTEROL (floo TIK a sone; ue MEK li DIN ee um; vye LAN ter ol) inhalation is a combination of 3 drugs to treat COPD and asthma. Umeclidinium and Vilanterol are bronchodilators that help keep airways open. Fluticasone decreases inflammation in the lungs. Do not use this drug combination for acute asthma attacks or bronchospasm. This medicine may be used for other purposes; ask your health care provider or pharmacist if you have questions. COMMON BRAND NAME(S): TRELEGY ELLIPTA What should I tell my care team before I take this medication? They need to know if you have any of these conditions: bone problems diabetes eye disease, vision problems heart disease high blood pressure history of irregular heartbeat immune system problems infection kidney disease pheochromocytoma prostate disease seizures thyroid disease trouble passing urine an unusual or allergic reaction to fluticasone, umeclidinium, vilanterol, lactose, milk proteins, other medicines, foods, dyes, or preservatives pregnant or trying to get pregnant breast-feeding How should I use this medication? This drug is inhaled through the mouth. Rinse your mouth with water after use. Make sure not to swallow the water. Take it as directed on the prescription label at the same time every day. Do not use it more often than directed. A special MedGuide will be given to you by the pharmacist with each prescription and refill. Be sure to read this information carefully each time. Talk to your pediatrician about the use of this drug in children. Special care may be needed. Overdosage: If you think you have taken too much of this medicine contact a  poison control center or emergency room at once. NOTE: This medicine is only for you. Do not share this medicine with others. What if I miss a dose? If you miss a dose, take it as soon as you can. If it is almost time for your next dose, take only that dose. Do not take double or extra doses. What may interact with this medication? Do not take this medicine with any of the following medications: cisapride dofetilide dronedarone MAOIs like Carbex, Eldepryl, Marplan, Nardil, and Parnate pimozide thioridazine ziprasidone This medicine may also interact with the following medications: aclidinium antihistamines for allergy antiviral medicines for HIV or AIDS atropine beta-blockers like metoprolol and propranolol certain antibiotics like clarithromycin and telithromycin certain medicines for bladder problems like oxybutynin, tolterodine certain medicines for depression, anxiety, or psychotic disturbances certain medicines for fungal infections like ketoconazole, itraconazole, posaconazole, voriconazole certain medicines for Parkinson's disease like benztropine, trihexyphenidyl certain medicines for stomach problems like dicyclomine, hyoscyamine certain medicines for travel sickness like scopolamine conivaptan diuretics ipratropium medicines for colds other medicines for breathing problems other medicines that prolong the QT interval (cause an abnormal heart rhythm) nefazodone tiotropium This list may not describe all possible interactions. Give your health care provider a list of all the medicines, herbs, non-prescription drugs, or dietary supplements you use. Also tell them if you smoke, drink alcohol, or use illegal drugs. Some items may interact with your medicine. What should I watch for while using this medication? Visit your doctor or health care professional for regular checkups. Tell your doctor or health care professional if your symptoms do not get better. Do not use this  medicine more than once every 24 hours. NEVER use  this medicine for an acute asthma or COPD attack. You should use your short-acting rescue inhalers for this purpose. If your symptoms get worse or if you need your short-acting inhalers more often, call your doctor right away. If you are going to have surgery tell your doctor or health care professional that you are using this medicine. Try not to come in contact with people with the chicken pox or measles. If you do, call your doctor. This medicine may increase blood sugar. Ask your healthcare provider if changes in diet or medicines are needed if you have diabetes. What side effects may I notice from receiving this medication? Side effects that you should report to your doctor or health care professional as soon as possible: allergic reactions like skin rash or hives, swelling of the face, lips, or tongue breathing problems right after inhaling your medicine chest pain eye pain fast, irregular heartbeat feeling faint or lightheaded, falls fever or chills nausea, vomiting signs and symptoms of high blood sugar such as being more thirsty or hungry or having to urinate more than normal. You may also feel very tired or have blurry vision. trouble passing urine Side effects that usually do not require medical attention (report these to your doctor or health care professional if they continue or are bothersome): back pain changes in taste cough diarrhea headache nervousness sore throat tremor This list may not describe all possible side effects. Call your doctor for medical advice about side effects. You may report side effects to FDA at 1-800-FDA-1088. Where should I keep my medication? Keep out of the reach of children and pets. Store at room temperature between 20 and 25 degrees C (68 and 77 degrees F). Keep inhaler away from extreme heat, cold or humidity. Throw away 6 weeks after removing it from the foil pouch, when the dose counter  reads "0" or after the expiration date, whichever is first. NOTE: This sheet is a summary. It may not cover all possible information. If you have questions about this medicine, talk to your doctor, pharmacist, or health care provider.  2022 Elsevier/Gold Standard (2021-04-11 00:00:00)  Chronic Obstructive Pulmonary Disease Exacerbation Chronic obstructive pulmonary disease (COPD) is a long-term (chronic) condition that affects the lungs. COPD is a general term that can be used to describe many different lung problems that cause lung inflammation and limit airflow, including chronic bronchitis and emphysema. COPD exacerbations are episodes when breathing symptoms flare up, become much worse, and require extra treatment. COPD exacerbations are usually caused by infections. Without treatment, COPD exacerbations can be severe and even life threatening. Frequent COPD exacerbations can cause further damage to the lungs. What are the causes? This condition may be caused by: Respiratory infections, including viral and bacterial infections. Exposure to smoke. Exposure to air pollution, chemical fumes, or dust. Things that can cause an allergic reaction (allergens). Not taking your usual COPD medicines as directed. Underlying medical problems, such as congestive heart failure or infections not involving the lungs. In many cases, the cause of this condition is not known. What increases the risk? The following factors may make you more likely to develop this condition: Smoking cigarettes. Being an older adult. Having frequent prior COPD exacerbations. What are the signs or symptoms? Symptoms of this condition include: Increased coughing. Increased production of mucus from your lungs. Increased wheezing and shortness of breath. Rapid or labored breathing. Chest tightness. Less energy than usual. Sleep disruption from symptoms. Confusion Increased sleepiness. Often, these symptoms happen or get  worse  even with the use of medicines. How is this diagnosed? This condition is diagnosed based on: Your medical history. A physical exam. You may also have tests, including: A chest X-ray. Blood tests. Lung (pulmonary) function tests. How is this treated? Treatment for this condition depends on the severity and cause of the symptoms. You may need to be admitted to a hospital for treatment. Some of the treatments commonly used to treat COPD exacerbations are: Antibiotic medicines. These may be used for severe exacerbations caused by a lung infection, such as pneumonia. Bronchodilators. These are inhaled medicines that expand the air passages and allow increased airflow. They may make your breathing more comfortable. Steroid medicines. These act to reduce inflammation in the airways. They may be given with an inhaler, taken by mouth, or given through an IV tube inserted into one of your veins. Supplemental oxygen therapy. Airway clearing techniques, such as noninvasive ventilation (NIV) and positive expiratory pressure (PEP). These provide respiratory support through a mask or other noninvasive device. An example of this would be using a continuous positive airway pressure (CPAP) machine to improve delivery of oxygen into your lungs. Follow these instructions at home: Medicines Take over-the-counter and prescription medicines only as told by your health care provider. It is important to use correct technique with inhaled medicines. If you were prescribed an antibiotic medicine or oral steroid, take it as told by your health care provider. Do not stop taking the medicine even if you start to feel better. Lifestyle Do not use any products that contain nicotine or tobacco. These products include cigarettes, chewing tobacco, and vaping devices, such as e-cigarettes. If you need help quitting, ask your health care provider. Eat a healthy diet. Exercise regularly. Get enough sleep. Most adults need  7 or more hours per night. Avoid exposure to all substances that irritate the airway, especially tobacco smoke. Regularly wash your hands with soap and water for at least 20 seconds. If soap and water are not available, use hand sanitizer. This may help prevent you from getting infections. During flu season, avoid enclosed spaces that are crowded with people. General instructions Drink enough fluid to keep your urine pale yellow, unless you have a medical condition that requires fluid restriction. Use a cool mist vaporizer. This humidifies the air and makes it easier for you to clear your chest when you cough. If you have a home nebulizer and oxygen, continue to use them as told by your health care provider. Keep all follow-up visits. This is important. How is this prevented? Stay up-to-date on pneumococcal and flu (influenza) vaccines. A flu shot is recommended every year to help prevent exacerbations. Quitting smoking is very important in preventing COPD from getting worse and in preventing exacerbations from happening as often. Follow all instructions for pulmonary rehabilitation after a recent exacerbation. This can help prevent future exacerbations. Work with your health care provider to develop and follow an action plan. This tells you what steps to take when you experience certain symptoms. Contact a health care provider if: You have a worsening of your regular COPD symptoms. Get help right away if: You have worsening shortness of breath, even when resting. You have trouble talking. You have severe chest pain. You cough up blood. You have a fever. You have weakness, vomit repeatedly, or faint. You feel confused. You are not able to sleep because of your symptoms. You have trouble doing daily activities. These symptoms may represent a serious problem that is an emergency. Do not wait  to see if the symptoms will go away. Get medical help right away. Call your local emergency services  (911 in the [place] Do not drive yourself to the hospital. Summary COPD exacerbations are episodes when breathing symptoms become much worse and require extra treatment above your normal treatment. Exacerbations can be severe and even life threatening. Frequent COPD exacerbations can cause further damage to your lungs. COPD exacerbations are usually triggered by infections such as the flu, colds, and even pneumonia. Treatment for this condition depends on the severity and cause of the symptoms. You may need to be admitted to a hospital for treatment. Quitting smoking is very important to prevent COPD from getting worse and to prevent exacerbations from happening as often. This information is not intended to replace advice given to you by your health care provider. Make sure you discuss any questions you have with your health care provider. Document Revised: 05/31/2020 Document Reviewed: 05/31/2020 Elsevier Patient Education  2022 [place].  Chronic Obstructive Pulmonary Disease Chronic obstructive pulmonary disease (COPD) is a long-term (chronic) condition that affects the lungs. COPD is a general term that can be used to describe many different lung problems that cause lung inflammation and limit airflow, including chronic bronchitis and emphysema. If you have COPD, your lung function will probably never return to normal. In most cases, it gets worse over time. However, there are steps you can take to slow the progression of the disease and improve your quality of life. What are the causes? This condition may be caused by: Smoking. This is the most common cause. Certain genes passed down through families. What increases the risk? The following factors may make you more likely to develop this condition: Being exposed to secondhand smoke from cigarettes, pipes, or cigars. Being exposed to chemicals and other irritants, such as fumes and dust in the work environment. Having chronic lung  conditions or infections. What are the signs or symptoms? Symptoms of this condition include: Shortness of breath, especially during physical activity. Chronic cough with a large amount of thick mucus. Sometimes, the cough may not have any mucus (dry cough). Wheezing and rapid breathing. Gray or bluish discoloration (cyanosis) of the skin, especially in the fingers, toes, or lips. Feeling tired (fatigue). Weight loss. Chest tightness. Frequent infections. Episodes when breathing symptoms become much worse (exacerbations). At the later stages of this disease, you may have swelling in the ankles, feet, or legs. How is this diagnosed? This condition is diagnosed based on: Your medical history. A physical exam. You may also have tests, including: Lung (pulmonary) function tests. This may include a spirometry test, which measures your ability to exhale properly. Chest X-ray. CT scan. Blood tests. How is this treated? This condition may be treated with: Medicines. These may include inhaled rescue medicines to treat acute exacerbations as well as medicines that you take long-term (maintenance medicines) to prevent flare-ups of COPD. Bronchodilators help treat COPD by dilating the airways to allow increased airflow and make your breathing more comfortable. Steroids can reduce airway inflammation and help prevent exacerbations. Smoking cessation. If you smoke, your health care provider may ask you to quit, and may also recommend therapy or replacement products to help you quit. Pulmonary rehabilitation. This may involve working with a team of health care providers and specialists, such as respiratory, occupational, and physical therapists. Exercise and physical activity. These are beneficial for nearly all people with COPD. Nutrition therapy to gain weight, if you are underweight. Oxygen. Supplemental oxygen therapy is only  helpful if you have a low oxygen level in your blood (hypoxemia). Lung  surgery or transplant. Palliative care. This is to help people with COPD feel comfortable when treatment is no longer working. Follow these instructions at home: Medicines Take over-the-counter and prescription medicines only as told by your health care provider. This includes inhaled medicines and pills. Talk to your health care provider before taking any cough or allergy medicines. You may need to avoid certain medicines that dry out your airways. Lifestyle If you smoke, the most important thing that you can do is to stop smoking. Continuing to smoke will cause the disease to progress faster. Do not use any products that contain nicotine or tobacco. These products include cigarettes, chewing tobacco, and vaping devices, such as e-cigarettes. If you need help quitting, ask your health care provider. Avoid exposure to things that irritate your lungs, such as smoke, chemicals, and fumes. Stay active, but balance activity with periods of rest. Exercise and physical activity will help you maintain your ability to do things you want to do. Learn and use relaxation techniques to manage stress and to control your breathing. Get the right amount of sleep and get quality sleep. Most adults need 7 or more hours per night. Eat healthy foods. Eating smaller, more frequent meals and resting before meals may help you maintain your strength. Controlled breathing Learn and use controlled breathing techniques as directed by your health care provider. Controlled breathing techniques include: Pursed lip breathing. Start by breathing in (inhaling) through your nose for 1 second. Then, purse your lips as if you were going to whistle and breathe out (exhale) through the pursed lips for 2 seconds. Diaphragmatic breathing. Start by putting one hand on your abdomen just above your waist. Inhale slowly through your nose. The hand on your abdomen should move out. Then purse your lips and exhale slowly. You should be able to  feel the hand on your abdomen moving in as you exhale.  Controlled coughing Learn and use controlled coughing to clear mucus from your lungs. Controlled coughing is a series of short, progressive coughs. The steps of controlled coughing are: Lean your head slightly forward. Breathe in deeply using diaphragmatic breathing. Try to hold your breath for 3 seconds. Keep your mouth slightly open while coughing twice. Spit any mucus out into a tissue. Rest and repeat the steps once or twice as needed. General instructions Make sure you receive all the vaccines that your health care provider recommends, especially the pneumococcal and influenza vaccines. Preventing infection and hospitalization is very important when you have COPD. Drink enough fluid to keep your urine pale yellow, unless you have a medical condition that requires fluid restriction. Use oxygen therapy and pulmonary rehabilitation if told by your health care provider. If you require home oxygen therapy, ask your health care provider whether you should purchase a pulse oximeter to measure your oxygen level at home. Work with your health care provider to develop a COPD action plan. This will help you know what steps to take if your condition gets worse. Keep other chronic health conditions under control as told by your health care provider. Avoid extreme temperature and humidity changes. Avoid contact with people who have an illness that spreads from person to person (is contagious), such as viral infections or pneumonia. Keep all follow-up visits. This is important. Contact a health care provider if: You are coughing up more mucus than usual. There is a change in the color or thickness of your  mucus. Your breathing is more labored than usual. Your breathing is faster than usual. You have difficulty sleeping. You need to use your rescue medicines or inhalers more often than expected. You have trouble doing routine activities such as  getting dressed or walking around the house. Get help right away if: You have shortness of breath while you are resting. You have shortness of breath that prevents you from: Being able to talk. Performing your usual physical activities. You have chest pain lasting longer than 5 minutes. Your skin color is more blue (cyanotic) than usual. You measure low oxygen saturations for longer than 5 minutes with a pulse oximeter. You have a fever. You feel too tired to breathe normally. These symptoms may represent a serious problem that is an emergency. Do not wait to see if the symptoms will go away. Get medical help right away. Call your local emergency services (911 in the [place] Do not drive yourself to the hospital. Summary Chronic obstructive pulmonary disease (COPD) is a long-term (chronic) condition that affects the lungs. Your lung function will probably never return to normal. In most cases, it gets worse over time. However, there are steps you can take to slow the progression of the disease and improve your quality of life. Treatment for COPD may include taking medicines, quitting smoking, pulmonary rehabilitation, and changes to diet and exercise. As the disease progresses, you may need oxygen therapy, a lung transplant, or palliative care. To help manage your condition, do not smoke, avoid exposure to things that irritate your lungs, stay up to date on all vaccines, and follow your health care provider's instructions for taking medicines. This information is not intended to replace advice given to you by your health care provider. Make sure you discuss any questions you have with your health care provider. Document Revised: 05/31/2020 Document Reviewed: 05/31/2020 Elsevier Patient Education  2022 [employer].

## 2021-08-24 NOTE — Telephone Encounter (Signed)
Stat Critical  Troponin   19

## 2021-08-24 NOTE — Telephone Encounter (Signed)
I spoke with Dian Queen from PT to clarify that directions were to inhale one puff daily then to rinse mouth. This was to replace the Advair he has been taking. She stated that she would reiterate this to patient again tomorrow when she sees him.

## 2021-08-24 NOTE — Telephone Encounter (Signed)
Patient aware of results and per verbal from MD Troponin improved from X 2 days ago at 26 , patient to be advised any reoccurring chest pain needs to return to ED if no chest pain keep appointment with cardiology as scheduled.

## 2021-08-24 NOTE — Telephone Encounter (Signed)
See phone note

## 2021-08-28 NOTE — Progress Notes (Deleted)
Cardiology Office Note    Date:  08/28/2021   ID:  Danny Organ Sr., DOB 03-13-1951, MRN [id number]  PCP:  [name], MD  Cardiologist:  Ida Rogue, MD  Electrophysiologist:  None   Chief Complaint: Follow-up  History of Present Illness:   Danny GAGO Sr. is a 71 y.o. male with history of CAD status post PCI to the LAD in 2011 with repeat PCI in 2016 secondary to ISR, HFrEF secondary to ICM, PAD status post left BKA in 11/2020, IDDM, HTN, HLD, COVID, COPD, CKD stage III, and prior tobacco use who presents for follow-up of his CAD and cardiomyopathy.  LHC on 10/01/2014 demonstrated severe mid LAD disease estimated at 90% with moderate mid LCx stenosis and an EF greater than 55%.  He underwent PCI to the LAD.  He was admitted in 2017 with an NSTEMI.  Echo at that time demonstrated an EF of 30% with septal and anterior wall hypokinesis, grade 1 diastolic dysfunction, and was overall a difficult study secondary to poor windows.  LHC at that time demonstrated a patent mid LAD stent with mild in-stent restenosis and moderate LAD disease distally.  There was diffuse small vessel branch disease affecting to diagonal, OM1, and PL branches.  There was no evidence of obstructive disease affecting the main coronary arteries.  Moderately reduced LV systolic function with an EF of 25 to 30% with global hypokinesis.    Echo in 10/2016 showed improvement in his LV systolic function with an EF of 55 to 60% and normal RV systolic function.  He was admitted in 11/2020 with left foot ischemia/gangrene subsequently undergoing left BKA.  Preop echo on 11/08/2020 showed an EF of 35 to 40%, global hypokinesis, mildly dilated LV internal cavity size, grade 1 diastolic dysfunction, normal RV systolic function and ventricular cavity size, trivial mitral regurgitation, and an estimated right atrial pressure of 8 mmHg.  Given his recent surgical procedure, ischemic evaluation was recommended to be undertaken as  an outpatient.  He has been lost to follow-up since.  Previously scheduled echo, from [facility] in 10/2020, performed on 12/06/2020 demonstrated an EF of 35 to 40%, global hypokinesis, indeterminate LV diastolic function parameters, normal RV systolic function and ventricular cavity size, no significant valvular abnormalities, and an estimated right atrial pressure of 3 mmHg.  He was recently admitted to the hospital from 08/26/2010 for COPD exacerbation that was treated with IV steroids, azithromycin, and bronchodilators.  High-sensitivity troponin was minimally elevated at 27.  He followed up with his PCPs office on 08/24/2021 ***.  High-sensitivity troponin was trended and found to be improved at 19 when compared to his recent admission.  He was advised to follow-up with cardiology today for further evaluation of ***.  ***   Labs independently reviewed: 08/2021 - potassium 3.9, BUN 46, serum creatinine 1.44, Hgb 15.0, PLT 240, A1c 7.4, albumin 3.9, AST/ALT normal 11/2020 - magnesium 2.1 10/2020 - TC 96, TG 60, HDL 46, LDL 38  Past Medical History:  Diagnosis Date   CAD (coronary artery disease)    a. 01/2010 PCI of LAD; b. 09/2014 PCI/DES of mLAD due to ISR. D1 80, D2 80(jailed), LCX 34m; c. 07/2016 NSTEMI/Cath: LM nl, LAD 53m ISR, 40d, D1 90ost, 80p, D2 90ost, RI min irregs, LCX min irregs, OM1 90 small, OM2/3 min irregs, RCA min irregs, RPLB1 90, EF 25-35%.   Chronic combined systolic and diastolic CHF (congestive heart failure) ([place])    a. 07/2016 Echo: EF 30%,  severe septal/anterior HK, Gr1 DD.   CKD (chronic kidney disease), stage III (HCC)    COPD (chronic obstructive pulmonary disease) (HCC)    DM2 (diabetes mellitus, type 2) (HCC)    Erectile dysfunction    HLD (hyperlipidemia)    HTN (hypertension)    Ischemic cardiomyopathy    a. 07/2016 Echo: EF 30% w/ sev septal/ant HK. Gr1 DD.    Past Surgical History:  Procedure Laterality Date   AMPUTATION Left 11/10/2020   Procedure: AMPUTATION BELOW  KNEE;  Surgeon: [name], MD;  Location: [facility];  Service: General;  Laterality: Left;   BELOW KNEE LEG AMPUTATION     CAD: stent to the LAD     CARDIAC CATHETERIZATION  10/01/2014   CARDIAC CATHETERIZATION N/A 07/23/2016   Procedure: Left Heart Cath and Coronary Angiography;  Surgeon: [name], MD;  Location: [facility];  Service: Cardiovascular;  Laterality: N/A;   CORONARY ANGIOPLASTY WITH STENT PLACEMENT  10/01/2014   LOWER EXTREMITY ANGIOGRAPHY Left 10/31/2020   Procedure: LOWER EXTREMITY ANGIOGRAPHY;  Surgeon: [name], MD;  Location: [facility];  Service: Cardiovascular;  Laterality: Left;    Current Medications: No outpatient medications have been marked as taking for the 08/30/21 encounter (Appointment) with Rise Mu, PA-C.    Allergies:   Patient has no known allergies.   Social History   Socioeconomic History   Marital status: Single    Spouse name: Not on file   Number of children: Not on file   Years of education: Not on file   Highest education level: Not on file  Occupational History   Not on file  Tobacco Use   Smoking status: Former    Packs/day: 3.00    Years: 0.00    Pack years: 0.00    Types: Cigarettes    Quit date: 09/07/2009    Years since quitting: 11.9   Smokeless tobacco: Never   Tobacco comments:    quit june 2011  Vaping Use   Vaping Use: Never used  Substance and Sexual Activity   Alcohol use: Yes    Comment: every other weekend   Drug use: No   Sexual activity: Not on file  Other Topics Concern   Not on file  Social History Narrative   Not on file   Social Determinants of Health   Financial Resource Strain: Low Risk    Difficulty of Paying Living Expenses: Not hard at all  Food Insecurity: No Food Insecurity   Worried About Charity fundraiser in the Last Year: Never true   [place] in the Last Year: Never true  Transportation Needs: Unmet Transportation Needs   Lack of Transportation  (Medical): Yes   Lack of Transportation (Non-Medical): Yes  Physical Activity: Not on file  Stress: No Stress Concern Present   Feeling of Stress : Only a little  Social Connections: Unknown   Frequency of Communication with Friends and Family: Not on file   Frequency of Social Gatherings with Friends and Family: More than three times a week   Attends Religious Services: Not on Electrical engineer or Organizations: Not on file   Attends Archivist Meetings: Not on file   Marital Status: Not on file     Family History:  The patient's family history includes Cancer in his brother; Heart attack in his father.  ROS:   ROS   EKGs/Labs/Other Studies Reviewed:  Studies reviewed were summarized above. The additional studies were reviewed today:  2D echo 12/06/2020:  1. Left ventricular ejection fraction, by estimation, is 35 to 40%. The  left ventricle has moderately decreased function. The left ventricle  demonstrates global hypokinesis. Left ventricular diastolic parameters are  indeterminate.   2. Right ventricular systolic function is normal. The right ventricular  size is normal.   3. The mitral valve was not well visualized. No evidence of mitral valve  regurgitation.   4. The aortic valve was not well visualized. Aortic valve regurgitation  is not visualized. No aortic stenosis is present.   5. The inferior vena cava is normal in size with greater than 50%  respiratory variability, suggesting right atrial pressure of 3 mmHg. __________  2D echo 11/08/2020: 1. Left ventricular ejection fraction, by estimation, is 35 to 40%. The  left ventricle has moderately decreased function. The left ventricle  demonstrates global hypokinesis. The left ventricular internal cavity size  was mildly dilated. Left ventricular  diastolic parameters are consistent with Grade I diastolic dysfunction  (impaired relaxation).   2. Right ventricular systolic function is normal.  The right ventricular  size is normal. Tricuspid regurgitation signal is inadequate for assessing  PA pressure.   3. The mitral valve is grossly normal. Trivial mitral valve  regurgitation. No evidence of mitral stenosis.   4. The aortic valve was not well visualized. Aortic valve regurgitation  is not visualized. No aortic stenosis is present.   5. The inferior vena cava is normal in size with <50% respiratory  variability, suggesting right atrial pressure of 8 mmHg. __________  2D echo 04/05/2017: - Left ventricle: The cavity size was normal. Systolic function was    normal. The estimated ejection fraction was in the range of 55%    to 60% based on apical images. The study is not technically    sufficient to allow evaluation of LV diastolic function.  - Right ventricle: Systolic function was normal.  - Pulmonary arteries: Systolic pressure could not be accurately    estimated.   Impressions:   - Very challenging image quality. Definity was not used as on    previous study 07/2016.  __________  LHC 07/23/2016: There is moderate to severe left ventricular systolic dysfunction. The left ventricular ejection fraction is 25-35% by visual estimate. There is trivial (1+) mitral regurgitation. There is no aortic valve stenosis. Mid LAD lesion, 20 %stenosed. Ost 2nd Diag to 2nd Diag lesion, 90 %stenosed. Dist LAD lesion, 40 %stenosed. Ost 1st Diag lesion, 90 %stenosed. 1st Diag lesion, 80 %stenosed. 1st Mrg lesion, 90 %stenosed. 1st RPLB lesion, 90 %stenosed.   1. No evidence of obstructive disease affecting the main arteries. Patent mid LAD stent with mild in-stent restenosis and moderate LAD disease distally. The patient does have diffuse small vessel branch disease affecting 2 diagonals, OM1 and PL 1. The disease is distal. 2. Moderately to severely reduced LV systolic function with an EF of 25-30% with global hypokinesis. 3. Moderately elevated filling pressures.    Recommendations: Compared to his most recent cardiac catheterization in 2016, the ejection fraction has decreased significantly. The patient seems to have mixed ischemic/ nonischemic cardiomyopathy. Continue medical therapy for coronary artery disease. Optimize heart failure treatment. I increased the dose of lisinopril. The patient is volume overloaded and I started oral furosemide. I'm going to avoid aggressive diuresis due to contrast exposure. Consider screening for sleep apnea if that has not been done in the past. __________  2D echo  07/21/2016: - Procedure narrative: Transthoracic echocardiography. Image    quality was suboptimal. The study was technically difficult, as a    result of poor acoustic windows and poor sound wave transmission.    Intravenous contrast (Definity) was administered.  - Left ventricle: The cavity size was mildly dilated. Wall    thickness was normal. The estimated ejection fraction was 30%.    Septal and anterior severe hypokinesis. However, overall poor    visualization of myocardium even with Definity. Doppler    parameters are consistent with abnormal left ventricular    relaxation (grade 1 diastolic dysfunction).  - Aortic valve: Poorly visualized, incomplete interrogation,    probably no stenosis or regurgitation.  - Mitral valve: Poorly visualized. No stenosis, probably no    significant regurgitation.  - Right ventricle: Poorly visualized. The cavity size was normal.    Systolic function was normal.  - Pulmonary arteries: No complete TR doppler jet so unable to    estimate PA systolic pressure.  - Systemic veins: IVC measured 1.9 cm with < 50% respirophasic    variation, suggesting RA pressure 8 mmHg.   Impressions:   - Very poor study even with Definity.   EKG:  EKG is ordered today.  The EKG ordered today demonstrates ***  Recent Labs: 11/11/2020: Magnesium 1.9 08/15/2021: ALT 21; B Natriuretic Peptide 239.9 08/17/2021: BUN 46; Creatinine,  Ser 1.44; Hemoglobin 15.0; Platelets 240; Potassium 3.9; Sodium 137  Recent Lipid Panel    Component Value Date/Time   CHOL 96 10/26/2020 1048   TRIG 60 10/26/2020 1048   HDL 46 10/26/2020 1048   CHOLHDL 2.1 10/26/2020 1048   VLDL 12 10/26/2020 1048   LDLCALC 38 10/26/2020 1048   LDLDIRECT 64.0 03/20/2018 0950    PHYSICAL EXAM:    VS:  There were no vitals taken for this visit.  BMI: There is no height or weight on file to calculate BMI.  Physical Exam  Wt Readings from Last 3 Encounters:  08/24/21 215 lb (97.5 kg)  08/15/21 215 lb (97.5 kg)  02/23/21 202 lb (91.6 kg)     ASSESSMENT & PLAN:   CAD involving the native coronary arteries without***angina:  HFrEF secondary to ICM:  HTN: Blood pressure ***  HLD: LDL 38.  CKD stage III:  PAD: Status post left BKA in 11/2020.  IDDM: A1c 7.4.   {Are you ordering a CV Procedure (e.g. stress test, cath, DCCV, TEE, etc)?   Press F2        :[id number]     Disposition: F/u with Dr. Rockey Situ or an APP in ***.   Medication Adjustments/Labs and Tests Ordered: Current medicines are reviewed at length with the patient today.  Concerns regarding medicines are outlined above. Medication changes, Labs and Tests ordered today are summarized above and listed in the Patient Instructions accessible in Encounters.   Signed, Christell Faith, PA-C 08/28/2021 8:27 AM     [facility] [address] [place], [place] [address] [phone]

## 2021-08-30 ENCOUNTER — Ambulatory Visit: Payer: Medicare Other | Admitting: Physician Assistant

## 2021-08-31 ENCOUNTER — Encounter: Payer: Self-pay | Admitting: Physician Assistant

## 2021-09-19 ENCOUNTER — Telehealth: Payer: Self-pay | Admitting: Family Medicine

## 2021-09-19 DIAGNOSIS — Z89512 Acquired absence of left leg below knee: Secondary | ICD-10-CM

## 2021-09-19 NOTE — Telephone Encounter (Signed)
Darral Dash called from [facility] stating that pt need a prescription for a tub bench with a suction cup.

## 2021-09-20 ENCOUNTER — Telehealth: Payer: Self-pay | Admitting: Family Medicine

## 2021-09-20 NOTE — Telephone Encounter (Signed)
I called the patient to see where he wanted his tub bench order to go and the phone was  not preceiving calls at this time.  Rajendra Spiller,cma

## 2021-09-20 NOTE — Telephone Encounter (Signed)
Signed. Please fax.

## 2021-09-20 NOTE — Telephone Encounter (Signed)
Verbal orders can be given for this.

## 2021-09-20 NOTE — Telephone Encounter (Signed)
Order signed. It likely needs to be printed and faxed to what ever DME company he is using.

## 2021-09-20 NOTE — Telephone Encounter (Signed)
I called and spoke with Danny [name] of [facility] and gave verbal orders per Dr. [name] for patient to have diabetic education once a week for 9 weeks. Cheyr[place] understood.  Alvah Gilder,cma

## 2021-09-20 NOTE — Telephone Encounter (Signed)
Order was put in the sign basket. Tamarion Haymond,cma

## 2021-09-20 NOTE — Telephone Encounter (Signed)
Cheryl from [facility] called asking for orders for patient Danny [name] to have Diabetes education 1 time per week for 9 weeks. Call [place] at [phone].

## 2021-09-21 NOTE — Telephone Encounter (Signed)
VM not setup.  Danny [name],cma  

## 2021-09-22 ENCOUNTER — Other Ambulatory Visit: Payer: Self-pay

## 2021-09-22 MED ORDER — FREESTYLE LIBRE 2 SENSOR MISC: 1 refills | Status: DC

## 2021-09-22 NOTE — Telephone Encounter (Signed)
I called the patient and he did not have a preference for where the tub bench came from he just wanted it delivered to his home, I called a few medical supply companies and they stated that his insurance would not pay for it, I informed the patient and he stated that it was okay he did not want it then.  Marcelina Mclaurin,cma

## 2021-09-29 DIAGNOSIS — I440 Atrioventricular block, first degree: Secondary | ICD-10-CM | POA: Diagnosis not present

## 2021-09-29 DIAGNOSIS — R062 Wheezing: Secondary | ICD-10-CM | POA: Diagnosis not present

## 2021-09-29 DIAGNOSIS — R000 Tachycardia, unspecified: Secondary | ICD-10-CM | POA: Diagnosis not present

## 2021-09-29 DIAGNOSIS — I10 Essential (primary) hypertension: Secondary | ICD-10-CM | POA: Diagnosis not present

## 2021-09-29 DIAGNOSIS — R0902 Hypoxemia: Secondary | ICD-10-CM | POA: Diagnosis not present

## 2021-09-30 ENCOUNTER — Emergency Department: Payer: Medicare HMO

## 2021-09-30 ENCOUNTER — Inpatient Hospital Stay (HOSPITAL_COMMUNITY)
Admission: AD | Admit: 2021-09-30 | Discharge: 2021-10-10 | DRG: 065 | Disposition: A | Discharge status: Skilled Nursing Facility | Payer: Medicare HMO | Source: Other Acute Inpatient Hospital | Attending: Internal Medicine | Admitting: Internal Medicine

## 2021-09-30 ENCOUNTER — Inpatient Hospital Stay (HOSPITAL_COMMUNITY): Payer: Medicare HMO

## 2021-09-30 ENCOUNTER — Other Ambulatory Visit: Payer: Self-pay

## 2021-09-30 ENCOUNTER — Emergency Department
Admission: EM | Admit: 2021-09-30 | Discharge: 2021-09-30 | Disposition: A | Discharge status: Other Acute Inpatient Hospital | Payer: Medicare HMO | Attending: Student in an Organized Health Care Education/Training Program | Admitting: Student in an Organized Health Care Education/Training Program

## 2021-09-30 DIAGNOSIS — E1142 Type 2 diabetes mellitus with diabetic polyneuropathy: Secondary | ICD-10-CM

## 2021-09-30 DIAGNOSIS — E46 Unspecified protein-calorie malnutrition: Secondary | ICD-10-CM | POA: Diagnosis not present

## 2021-09-30 DIAGNOSIS — I639 Cerebral infarction, unspecified: Secondary | ICD-10-CM | POA: Diagnosis not present

## 2021-09-30 DIAGNOSIS — E785 Hyperlipidemia, unspecified: Secondary | ICD-10-CM | POA: Diagnosis not present

## 2021-09-30 DIAGNOSIS — E1151 Type 2 diabetes mellitus with diabetic peripheral angiopathy without gangrene: Secondary | ICD-10-CM | POA: Diagnosis not present

## 2021-09-30 DIAGNOSIS — Z8249 Family history of ischemic heart disease and other diseases of the circulatory system: Secondary | ICD-10-CM

## 2021-09-30 DIAGNOSIS — Z20822 Contact with and (suspected) exposure to covid-19: Secondary | ICD-10-CM | POA: Diagnosis present

## 2021-09-30 DIAGNOSIS — R251 Tremor, unspecified: Secondary | ICD-10-CM | POA: Diagnosis not present

## 2021-09-30 DIAGNOSIS — R0602 Shortness of breath: Secondary | ICD-10-CM | POA: Diagnosis not present

## 2021-09-30 DIAGNOSIS — R6889 Other general symptoms and signs: Secondary | ICD-10-CM | POA: Diagnosis not present

## 2021-09-30 DIAGNOSIS — R140 Abdominal distension (gaseous): Secondary | ICD-10-CM | POA: Diagnosis not present

## 2021-09-30 DIAGNOSIS — Z955 Presence of coronary angioplasty implant and graft: Secondary | ICD-10-CM

## 2021-09-30 DIAGNOSIS — Z0189 Encounter for other specified special examinations: Secondary | ICD-10-CM

## 2021-09-30 DIAGNOSIS — M6281 Muscle weakness (generalized): Secondary | ICD-10-CM | POA: Diagnosis not present

## 2021-09-30 DIAGNOSIS — Z89512 Acquired absence of left leg below knee: Secondary | ICD-10-CM

## 2021-09-30 DIAGNOSIS — I615 Nontraumatic intracerebral hemorrhage, intraventricular: Secondary | ICD-10-CM | POA: Diagnosis not present

## 2021-09-30 DIAGNOSIS — I959 Hypotension, unspecified: Secondary | ICD-10-CM | POA: Diagnosis not present

## 2021-09-30 DIAGNOSIS — I619 Nontraumatic intracerebral hemorrhage, unspecified: Secondary | ICD-10-CM | POA: Insufficient documentation

## 2021-09-30 DIAGNOSIS — N400 Enlarged prostate without lower urinary tract symptoms: Secondary | ICD-10-CM | POA: Diagnosis not present

## 2021-09-30 DIAGNOSIS — Z978 Presence of other specified devices: Secondary | ICD-10-CM

## 2021-09-30 DIAGNOSIS — I6501 Occlusion and stenosis of right vertebral artery: Secondary | ICD-10-CM | POA: Diagnosis present

## 2021-09-30 DIAGNOSIS — M47812 Spondylosis without myelopathy or radiculopathy, cervical region: Secondary | ICD-10-CM | POA: Diagnosis not present

## 2021-09-30 DIAGNOSIS — Z8673 Personal history of transient ischemic attack (TIA), and cerebral infarction without residual deficits: Secondary | ICD-10-CM | POA: Diagnosis not present

## 2021-09-30 DIAGNOSIS — E1122 Type 2 diabetes mellitus with diabetic chronic kidney disease: Secondary | ICD-10-CM | POA: Diagnosis not present

## 2021-09-30 DIAGNOSIS — R296 Repeated falls: Secondary | ICD-10-CM | POA: Diagnosis present

## 2021-09-30 DIAGNOSIS — N529 Male erectile dysfunction, unspecified: Secondary | ICD-10-CM | POA: Diagnosis not present

## 2021-09-30 DIAGNOSIS — I130 Hypertensive heart and chronic kidney disease with heart failure and stage 1 through stage 4 chronic kidney disease, or unspecified chronic kidney disease: Secondary | ICD-10-CM | POA: Diagnosis present

## 2021-09-30 DIAGNOSIS — J439 Emphysema, unspecified: Secondary | ICD-10-CM | POA: Diagnosis not present

## 2021-09-30 DIAGNOSIS — Z4682 Encounter for fitting and adjustment of non-vascular catheter: Secondary | ICD-10-CM | POA: Diagnosis not present

## 2021-09-30 DIAGNOSIS — W19XXXA Unspecified fall, initial encounter: Secondary | ICD-10-CM

## 2021-09-30 DIAGNOSIS — Z8679 Personal history of other diseases of the circulatory system: Secondary | ICD-10-CM | POA: Diagnosis present

## 2021-09-30 DIAGNOSIS — Z1159 Encounter for screening for other viral diseases: Secondary | ICD-10-CM | POA: Diagnosis not present

## 2021-09-30 DIAGNOSIS — Z7984 Long term (current) use of oral hypoglycemic drugs: Secondary | ICD-10-CM

## 2021-09-30 DIAGNOSIS — E669 Obesity, unspecified: Secondary | ICD-10-CM | POA: Diagnosis not present

## 2021-09-30 DIAGNOSIS — R739 Hyperglycemia, unspecified: Secondary | ICD-10-CM | POA: Diagnosis not present

## 2021-09-30 DIAGNOSIS — Z6830 Body mass index (BMI) 30.0-30.9, adult: Secondary | ICD-10-CM

## 2021-09-30 DIAGNOSIS — F32A Depression, unspecified: Secondary | ICD-10-CM | POA: Diagnosis not present

## 2021-09-30 DIAGNOSIS — R29702 NIHSS score 2: Secondary | ICD-10-CM | POA: Diagnosis not present

## 2021-09-30 DIAGNOSIS — S06360A Traumatic hemorrhage of cerebrum, unspecified, without loss of consciousness, initial encounter: Secondary | ICD-10-CM | POA: Diagnosis not present

## 2021-09-30 DIAGNOSIS — Z7401 Bed confinement status: Secondary | ICD-10-CM | POA: Diagnosis not present

## 2021-09-30 DIAGNOSIS — S0990XA Unspecified injury of head, initial encounter: Secondary | ICD-10-CM | POA: Diagnosis present

## 2021-09-30 DIAGNOSIS — I70219 Atherosclerosis of native arteries of extremities with intermittent claudication, unspecified extremity: Secondary | ICD-10-CM | POA: Diagnosis not present

## 2021-09-30 DIAGNOSIS — Z7982 Long term (current) use of aspirin: Secondary | ICD-10-CM

## 2021-09-30 DIAGNOSIS — J449 Chronic obstructive pulmonary disease, unspecified: Secondary | ICD-10-CM | POA: Diagnosis not present

## 2021-09-30 DIAGNOSIS — Z743 Need for continuous supervision: Secondary | ICD-10-CM | POA: Diagnosis not present

## 2021-09-30 DIAGNOSIS — N183 Chronic kidney disease, stage 3 unspecified: Secondary | ICD-10-CM | POA: Diagnosis present

## 2021-09-30 DIAGNOSIS — I610 Nontraumatic intracerebral hemorrhage in hemisphere, subcortical: Secondary | ICD-10-CM

## 2021-09-30 DIAGNOSIS — M47816 Spondylosis without myelopathy or radiculopathy, lumbar region: Secondary | ICD-10-CM | POA: Diagnosis not present

## 2021-09-30 DIAGNOSIS — R000 Tachycardia, unspecified: Secondary | ICD-10-CM | POA: Insufficient documentation

## 2021-09-30 DIAGNOSIS — W06XXXA Fall from bed, initial encounter: Secondary | ICD-10-CM | POA: Insufficient documentation

## 2021-09-30 DIAGNOSIS — E1140 Type 2 diabetes mellitus with diabetic neuropathy, unspecified: Secondary | ICD-10-CM | POA: Diagnosis not present

## 2021-09-30 DIAGNOSIS — Z87891 Personal history of nicotine dependence: Secondary | ICD-10-CM | POA: Diagnosis not present

## 2021-09-30 DIAGNOSIS — J432 Centrilobular emphysema: Secondary | ICD-10-CM | POA: Diagnosis not present

## 2021-09-30 DIAGNOSIS — I672 Cerebral atherosclerosis: Secondary | ICD-10-CM | POA: Diagnosis not present

## 2021-09-30 DIAGNOSIS — R0689 Other abnormalities of breathing: Secondary | ICD-10-CM | POA: Diagnosis not present

## 2021-09-30 DIAGNOSIS — R531 Weakness: Secondary | ICD-10-CM | POA: Diagnosis not present

## 2021-09-30 DIAGNOSIS — Y92009 Unspecified place in unspecified non-institutional (private) residence as the place of occurrence of the external cause: Secondary | ICD-10-CM | POA: Diagnosis not present

## 2021-09-30 DIAGNOSIS — Z794 Long term (current) use of insulin: Secondary | ICD-10-CM

## 2021-09-30 DIAGNOSIS — I509 Heart failure, unspecified: Secondary | ICD-10-CM | POA: Diagnosis not present

## 2021-09-30 DIAGNOSIS — I6523 Occlusion and stenosis of bilateral carotid arteries: Secondary | ICD-10-CM | POA: Diagnosis not present

## 2021-09-30 DIAGNOSIS — R58 Hemorrhage, not elsewhere classified: Secondary | ICD-10-CM | POA: Diagnosis not present

## 2021-09-30 DIAGNOSIS — K219 Gastro-esophageal reflux disease without esophagitis: Secondary | ICD-10-CM | POA: Diagnosis not present

## 2021-09-30 DIAGNOSIS — G9389 Other specified disorders of brain: Secondary | ICD-10-CM | POA: Diagnosis not present

## 2021-09-30 DIAGNOSIS — I2510 Atherosclerotic heart disease of native coronary artery without angina pectoris: Secondary | ICD-10-CM | POA: Diagnosis not present

## 2021-09-30 DIAGNOSIS — Z7902 Long term (current) use of antithrombotics/antiplatelets: Secondary | ICD-10-CM

## 2021-09-30 DIAGNOSIS — G910 Communicating hydrocephalus: Secondary | ICD-10-CM | POA: Diagnosis not present

## 2021-09-30 DIAGNOSIS — I10 Essential (primary) hypertension: Secondary | ICD-10-CM | POA: Diagnosis not present

## 2021-09-30 DIAGNOSIS — I5042 Chronic combined systolic (congestive) and diastolic (congestive) heart failure: Secondary | ICD-10-CM | POA: Diagnosis present

## 2021-09-30 DIAGNOSIS — E1165 Type 2 diabetes mellitus with hyperglycemia: Secondary | ICD-10-CM | POA: Diagnosis not present

## 2021-09-30 DIAGNOSIS — I69991 Dysphagia following unspecified cerebrovascular disease: Secondary | ICD-10-CM | POA: Diagnosis not present

## 2021-09-30 DIAGNOSIS — M6259 Muscle wasting and atrophy, not elsewhere classified, multiple sites: Secondary | ICD-10-CM | POA: Diagnosis not present

## 2021-09-30 DIAGNOSIS — F0390 Unspecified dementia without behavioral disturbance: Secondary | ICD-10-CM | POA: Diagnosis present

## 2021-09-30 DIAGNOSIS — S199XXA Unspecified injury of neck, initial encounter: Secondary | ICD-10-CM | POA: Diagnosis not present

## 2021-09-30 DIAGNOSIS — I6389 Other cerebral infarction: Secondary | ICD-10-CM | POA: Diagnosis not present

## 2021-09-30 DIAGNOSIS — I69820 Aphasia following other cerebrovascular disease: Secondary | ICD-10-CM | POA: Diagnosis not present

## 2021-09-30 DIAGNOSIS — R262 Difficulty in walking, not elsewhere classified: Secondary | ICD-10-CM | POA: Diagnosis not present

## 2021-09-30 DIAGNOSIS — R2681 Unsteadiness on feet: Secondary | ICD-10-CM | POA: Diagnosis not present

## 2021-09-30 LAB — BASIC METABOLIC PANEL WITH GFR
Anion gap: 11 (ref 5–15)
BUN: 23 mg/dL (ref 8–23)
CO2: 23 mmol/L (ref 22–32)
Calcium: 9.3 mg/dL (ref 8.9–10.3)
Chloride: 102 mmol/L (ref 98–111)
Creatinine, Ser: 0.94 mg/dL (ref 0.61–1.24)
GFR, Estimated: >60 mL/min
Glucose, Bld: 215 mg/dL — ABNORMAL HIGH (ref 70–99)
Potassium: 4.1 mmol/L (ref 3.5–5.1)
Sodium: 136 mmol/L (ref 135–145)

## 2021-09-30 LAB — BLOOD GAS, VENOUS
Acid-Base Excess: 1.2 mmol/L (ref 0.0–2.0)
Bicarbonate: 27.6 mmol/L (ref 20.0–28.0)
O2 Saturation: 53.4 %
Patient temperature: 37
pCO2, Ven: 50 mmHg (ref 44–60)
pH, Ven: 7.35 (ref 7.25–7.43)
pO2, Ven: 32 mmHg (ref 32–45)

## 2021-09-30 LAB — CBC
HCT: 53.7 % — ABNORMAL HIGH (ref 39.0–52.0)
Hemoglobin: 16.9 g/dL (ref 13.0–17.0)
MCH: 29.7 pg (ref 26.0–34.0)
MCHC: 31.5 g/dL (ref 30.0–36.0)
MCV: 94.4 fL (ref 80.0–100.0)
Platelets: 269 10*3/uL (ref 150–400)
RBC: 5.69 MIL/uL (ref 4.22–5.81)
RDW: 13.3 % (ref 11.5–15.5)
WBC: 11 10*3/uL — ABNORMAL HIGH (ref 4.0–10.5)
nRBC: 0 % (ref 0.0–0.2)

## 2021-09-30 LAB — MRSA NEXT GEN BY PCR, NASAL: MRSA by PCR Next Gen: NOT DETECTED

## 2021-09-30 LAB — TROPONIN I (HIGH SENSITIVITY)
Troponin I (High Sensitivity): 15 ng/L (ref <18)
Troponin I (High Sensitivity): 20 ng/L — ABNORMAL HIGH (ref <18)

## 2021-09-30 LAB — HEPATIC FUNCTION PANEL
ALT: 25 U/L (ref 0–44)
AST: 23 U/L (ref 15–41)
Albumin: 4 g/dL (ref 3.5–5.0)
Alkaline Phosphatase: 98 U/L (ref 38–126)
Bilirubin, Direct: 0.2 mg/dL (ref 0.0–0.2)
Indirect Bilirubin: 0.6 mg/dL (ref 0.3–0.9)
Total Bilirubin: 0.8 mg/dL (ref 0.3–1.2)
Total Protein: 7.2 g/dL (ref 6.5–8.1)

## 2021-09-30 LAB — GLUCOSE, CAPILLARY
Glucose-Capillary: 216 mg/dL — ABNORMAL HIGH (ref 70–99)
Glucose-Capillary: 198 mg/dL — ABNORMAL HIGH (ref 70–99)

## 2021-09-30 LAB — BRAIN NATRIURETIC PEPTIDE: B Natriuretic Peptide: 271.2 pg/mL — ABNORMAL HIGH (ref 0.0–100.0)

## 2021-09-30 LAB — PROTIME-INR
INR: 1.1 (ref 0.8–1.2)
Prothrombin Time: 14.3 seconds (ref 11.4–15.2)

## 2021-09-30 LAB — CBG MONITORING, ED: Glucose-Capillary: 175 mg/dL — ABNORMAL HIGH (ref 70–99)

## 2021-09-30 MED ORDER — INSULIN ASPART 100 UNIT/ML IJ SOLN
0.0000 [IU] | INTRAMUSCULAR | Status: DC
  Administered 2021-09-30: 5 [IU] via SUBCUTANEOUS
  Administered 2021-10-01 (×6): 3 [IU] via SUBCUTANEOUS
  Administered 2021-10-01: 5 [IU] via SUBCUTANEOUS
  Administered 2021-10-02 (×2): 2 [IU] via SUBCUTANEOUS
  Administered 2021-10-02: 3 [IU] via SUBCUTANEOUS
  Administered 2021-10-02 – 2021-10-03 (×2): 2 [IU] via SUBCUTANEOUS
  Administered 2021-10-03: 3 [IU] via SUBCUTANEOUS
  Administered 2021-10-03 (×3): 2 [IU] via SUBCUTANEOUS
  Administered 2021-10-04 (×2): 3 [IU] via SUBCUTANEOUS
  Administered 2021-10-04 (×2): 2 [IU] via SUBCUTANEOUS
  Administered 2021-10-04: 3 [IU] via SUBCUTANEOUS
  Administered 2021-10-05 (×6): 2 [IU] via SUBCUTANEOUS
  Administered 2021-10-06 (×2): 3 [IU] via SUBCUTANEOUS
  Administered 2021-10-06: 2 [IU] via SUBCUTANEOUS
  Administered 2021-10-06: 5 [IU] via SUBCUTANEOUS
  Administered 2021-10-07 (×5): 3 [IU] via SUBCUTANEOUS
  Administered 2021-10-07: 11 [IU] via SUBCUTANEOUS
  Administered 2021-10-08: 2 [IU] via SUBCUTANEOUS
  Administered 2021-10-08: 3 [IU] via SUBCUTANEOUS
  Administered 2021-10-08: 5 [IU] via SUBCUTANEOUS
  Administered 2021-10-08 (×2): 2 [IU] via SUBCUTANEOUS
  Administered 2021-10-09 (×3): 3 [IU] via SUBCUTANEOUS
  Administered 2021-10-09 (×2): 2 [IU] via SUBCUTANEOUS
  Administered 2021-10-09: 5 [IU] via SUBCUTANEOUS
  Administered 2021-10-09 – 2021-10-10 (×3): 2 [IU] via SUBCUTANEOUS
  Administered 2021-10-10: 3 [IU] via SUBCUTANEOUS
  Administered 2021-10-10: 5 [IU] via SUBCUTANEOUS

## 2021-09-30 MED ORDER — SENNOSIDES-DOCUSATE SODIUM 8.6-50 MG PO TABS
1.0000 | ORAL_TABLET | Freq: Two times a day (BID) | ORAL | Status: DC
  Administered 2021-09-30 – 2021-10-02 (×4): 1 via ORAL
  Filled 2021-09-30 (×4): qty 1

## 2021-09-30 MED ORDER — ACETAMINOPHEN 650 MG RE SUPP: 650.0000 mg | RECTAL | Status: DC | PRN

## 2021-09-30 MED ORDER — CARVEDILOL 12.5 MG PO TABS
12.5000 mg | ORAL_TABLET | Freq: Two times a day (BID) | ORAL | Status: DC
  Administered 2021-09-30 – 2021-10-06 (×8): 12.5 mg via ORAL
  Filled 2021-09-30 (×8): qty 1

## 2021-09-30 MED ORDER — GABAPENTIN 300 MG PO CAPS
300.0000 mg | ORAL_CAPSULE | Freq: Every day | ORAL | Status: DC
  Administered 2021-09-30 – 2021-10-02 (×3): 300 mg via ORAL
  Filled 2021-09-30 (×3): qty 1

## 2021-09-30 MED ORDER — FOLIC ACID 1 MG PO TABS
1.0000 mg | ORAL_TABLET | Freq: Every day | ORAL | Status: DC
  Administered 2021-09-30 – 2021-10-10 (×9): 1 mg via ORAL
  Filled 2021-09-30 (×9): qty 1

## 2021-09-30 MED ORDER — THIAMINE HCL 100 MG PO TABS
100.0000 mg | ORAL_TABLET | Freq: Every day | ORAL | Status: DC
  Administered 2021-09-30 – 2021-10-10 (×9): 100 mg via ORAL
  Filled 2021-09-30 (×9): qty 1

## 2021-09-30 MED ORDER — CHLORHEXIDINE GLUCONATE CLOTH 2 % EX PADS
6.0000 | MEDICATED_PAD | Freq: Every day | CUTANEOUS | Status: DC
  Administered 2021-10-01 – 2021-10-05 (×5): 6 via TOPICAL

## 2021-09-30 MED ORDER — ADULT MULTIVITAMIN W/MINERALS CH
1.0000 | ORAL_TABLET | Freq: Every day | ORAL | Status: DC
  Administered 2021-09-30 – 2021-10-10 (×8): 1 via ORAL
  Filled 2021-09-30 (×9): qty 1

## 2021-09-30 MED ORDER — ACETAMINOPHEN 325 MG PO TABS
650.0000 mg | ORAL_TABLET | ORAL | Status: DC | PRN
  Administered 2021-10-01 – 2021-10-07 (×10): 650 mg via ORAL
  Filled 2021-09-30 (×10): qty 2

## 2021-09-30 MED ORDER — CLEVIDIPINE BUTYRATE 0.5 MG/ML IV EMUL
0.0000 mg/h | INTRAVENOUS | Status: DC
  Administered 2021-09-30: 10 mg/h via INTRAVENOUS
  Administered 2021-10-01: 12 mg/h via INTRAVENOUS
  Administered 2021-10-01: 1 mg/h via INTRAVENOUS
  Administered 2021-10-01: 12 mg/h via INTRAVENOUS
  Administered 2021-10-01: 11 mg/h via INTRAVENOUS
  Administered 2021-10-02: 5 mg/h via INTRAVENOUS
  Administered 2021-10-02: 4 mg/h via INTRAVENOUS
  Administered 2021-10-02: 8 mg/h via INTRAVENOUS
  Administered 2021-10-03: 10 mg/h via INTRAVENOUS
  Administered 2021-10-03 (×2): 8 mg/h via INTRAVENOUS
  Administered 2021-10-03 (×2): 10 mg/h via INTRAVENOUS
  Administered 2021-10-04 (×2): 12 mg/h via INTRAVENOUS
  Administered 2021-10-04: 8 mg/h via INTRAVENOUS
  Filled 2021-09-30 (×6): qty 100
  Filled 2021-09-30: qty 50
  Filled 2021-09-30 (×10): qty 100

## 2021-09-30 MED ORDER — STROKE: EARLY STAGES OF RECOVERY BOOK
Freq: Once | Status: AC
  Filled 2021-09-30: qty 1

## 2021-09-30 MED ORDER — CLEVIDIPINE BUTYRATE 0.5 MG/ML IV EMUL
0.0000 mg/h | INTRAVENOUS | Status: DC
  Administered 2021-09-30: 2 mg/h via INTRAVENOUS
  Filled 2021-09-30: qty 50

## 2021-09-30 MED ORDER — IPRATROPIUM-ALBUTEROL 0.5-2.5 (3) MG/3ML IN SOLN
3.0000 mL | Freq: Once | RESPIRATORY_TRACT | Status: AC
  Administered 2021-09-30: 3 mL via RESPIRATORY_TRACT
  Filled 2021-09-30: qty 3

## 2021-09-30 MED ORDER — LORAZEPAM 2 MG/ML IJ SOLN
1.0000 mg | INTRAMUSCULAR | Status: DC | PRN
  Administered 2021-10-01 – 2021-10-02 (×4): 2 mg via INTRAVENOUS
  Filled 2021-09-30 (×4): qty 1

## 2021-09-30 MED ORDER — ACETAMINOPHEN 160 MG/5ML PO SOLN: 650.0000 mg | ORAL | Status: DC | PRN

## 2021-09-30 MED ORDER — CLEVIDIPINE BUTYRATE 0.5 MG/ML IV EMUL
INTRAVENOUS | Status: AC
  Administered 2021-09-30: 9 mg/h via INTRAVENOUS
  Filled 2021-09-30: qty 100

## 2021-09-30 MED ORDER — PANTOPRAZOLE SODIUM 40 MG IV SOLR
40.0000 mg | Freq: Every day | INTRAVENOUS | Status: DC
  Administered 2021-09-30 – 2021-10-01 (×2): 40 mg via INTRAVENOUS
  Filled 2021-09-30 (×2): qty 10

## 2021-09-30 NOTE — ED Notes (Signed)
Pt alert, NAD, calm, interactive, resps e/u. C/o feeling hot and thirsty. To CT.

## 2021-09-30 NOTE — ED Triage Notes (Signed)
Pt comes into the ED via EMS from home with c/o falling out of bed today, wheel chair bound, son/care giver had a head bleed and is currently in the hospital, pt covered in feces  #18gLFA 4mg  Zofran 190/110 [id number] 84%RA, 96% on 2L 

## 2021-09-30 NOTE — ED Notes (Signed)
Placed on 2L [place] for intermittent low SPO2.

## 2021-09-30 NOTE — ED Notes (Signed)
Pt called out needing assistance to use the BR, pt taken to triage 2, pt noted to already have urine soaked pants and underwear, with dry pasted feces  pt perineal area is red and inflammed, pt cleaned and linens changed, belonging placed in a bag with pt, warm blankets applied

## 2021-09-30 NOTE — Consult Note (Signed)
Neurology Consultation Reason for Consult: Intracranial hemorrhage Referring Physician: Nigel Bridgeman  CC: Intracranial hemorrhage  History is obtained from: Patient  HPI: Danny BUCKMASTER Sr. is a 71 y.o. male with a history of hypertension, hyperlipidemia who was last seen well a couple of days ago.  He typically lives with his son, but his son had an intracranial hemorrhage and is in the [facility] neuro ICU.  He is therefore home alone, and this morning when his daughter went to pick him up she found him on the ground beside his chair.  He was slightly more confused than typical (he has dementia).  She brought him into the emergency department because he was not acting quite right where head CT revealed an intraparenchymal hematoma with intraventricular extension.   LKW: 2 days ago tpa given?: no,  NIHSS: 2 ICH score: 1   Past Medical History:  Diagnosis Date   CAD (coronary artery disease)    a. 01/2010 PCI of LAD; b. 09/2014 PCI/DES of mLAD due to ISR. D1 80, D2 80(jailed), LCX 52m; c. 07/2016 NSTEMI/Cath: LM nl, LAD 78m ISR, 40d, D1 90ost, 80p, D2 90ost, RI min irregs, LCX min irregs, OM1 90 small, OM2/3 min irregs, RCA min irregs, RPLB1 90, EF 25-35%.   Chronic combined systolic and diastolic CHF (congestive heart failure) ([place])    a. 07/2016 Echo: EF 30%, severe septal/anterior HK, Gr1 DD.   CKD (chronic kidney disease), stage III (HCC)    COPD (chronic obstructive pulmonary disease) (HCC)    DM2 (diabetes mellitus, type 2) (HCC)    Erectile dysfunction    HLD (hyperlipidemia)    HTN (hypertension)    Ischemic cardiomyopathy    a. 07/2016 Echo: EF 30% w/ sev septal/ant HK. Gr1 DD.     Family History  Problem Relation Age of Onset   Heart attack Father        complications   Cancer Brother      Social History:  reports that he quit smoking about 12 years ago. His smoking use included cigarettes. He smoked an average of 3.00 packs per day. He has never used smokeless tobacco. He  reports current alcohol use. He reports that he does not use drugs.   Exam: Current vital signs: BP (!) 192/120    Pulse 89    Temp 97.7 F (36.5 C) (Oral)    Resp (!) 21    SpO2 (!) 84%  Vital signs in last 24 hours: Temp:  [97.7 F (36.5 C)] 97.7 F (36.5 C) (02/25 1057) Pulse Rate:  [89-105] 89 (02/25 1545) Resp:  [19-21] 21 (02/25 1545) BP: (160-193)/(101-128) 192/120 (02/25 1500) SpO2:  [60 %-98 %] 84 % (02/25 1545)   Physical Exam  Constitutional: Appears elderly He has a left leg amputation  Neuro: Mental Status: Patient is awake, alert, he gives the month as January, the year as 2002.  He is unable to give his age. Cranial Nerves: II: Visual Fields are full. Pupils are equal, round, and reactive to light.   III,IV, VI: EOMI without ptosis or diploplia.  V: Facial sensation is symmetric to temperature VII: Facial movement is symmetric.  VIII: hearing is intact to voice X: Uvula elevates symmetrically XI: Shoulder shrug is symmetric. XII: tongue is midline without atrophy or fasciculations.  Motor: Tone is normal. Bulk is normal. 5/5 strength was present in all four extremities.  He has no drift. Sensory: Sensation is symmetric to light touch and temperature in the arms and legs. Cerebellar: He  has significant intentional tremor on finger-nose-finger bilaterally without past-pointing.     I have reviewed labs in epic and the results pertinent to this consultation are: Creatinine 0.94  I have reviewed the images obtained: CT head-periventricular IPH with intraventricular extension  Impression: 71 year old male with IPH with intraventricular extension.  My suspicion is that this represents hypertensive hemorrhage, though ischemic stroke with hemorrhagic conversion is possible.  He is on ASA and plavix, but no anticoagulation.   Recommendations: 1) Admit to ICU 2) no antiplatelets or anticoagulants 3) blood pressure control with goal systolic [id number] 4)  Frequent neuro checks 5) If symptoms worsen or there is decreased mental status, repeat stat head CT 6) PT,OT,ST 7) Repeat CT scan overnight to assess for hydrocephalus    Roland Rack, MD [facility] [phone]  If 7pm- 7am, please page neurology on call as listed in [place].

## 2021-09-30 NOTE — ED Notes (Signed)
Back from CT. Neurology in to see, at [facility].

## 2021-09-30 NOTE — ED Provider Notes (Addendum)
[facility] Provider Note    Event Date/Time   First MD Initiated Contact with Patient 09/30/21 1414     (approximate)   History   Weakness   HPI  Danny DEBLOCK Sr. is a 71 y.o. male with a history of COPD as well as cardiac disease PVD status post left below-knee amputation presents to the ER for fall and headache.  He is currently at home alone is his son is in the hospital after hemorrhagic stroke.  Daughter was going to pick up the patient this morning of visit family member in the hospital she found him beside his chair.  Has not been having multiple falls just had this 1 time.  Does feel somewhat short of breath.  Is complaining of mild headache for which he usually takes Tylenol.  Denies any fevers or chills.  States he adamantly does not want to be placed in nursing facility or rehab.     Physical Exam   Triage Vital Signs: ED Triage Vitals [09/30/21 1057]  Enc Vitals Group     BP (!) 193/101     Pulse Rate 100     Resp 20     Temp 97.7 F (36.5 C)     Temp Source Oral     SpO2 94 %     Weight      Height      Head Circumference      Peak Flow      Pain Score      Pain Loc      Pain Edu?      Excl. in GC?     Most recent vital signs: Vitals:   09/30/21 1430 09/30/21 1500  BP: (!) 179/128 (!) 192/120  Pulse: (!) 101 100  Resp: 19 19  Temp:    SpO2: 90% 92%     Constitutional: Alert  Eyes: Conjunctivae are normal.  Head: Atraumatic. Nose: No congestion/rhinnorhea. Mouth/Throat: Mucous membranes are moist.   Neck: Painless ROM.  Cardiovascular:   Good peripheral circulation. Respiratory: mild tachypnea with prolonged expiratory phase Gastrointestinal: Soft and nontender.  Musculoskeletal:  no deformity Neurologic:  MAE spontaneously. No gross focal neurologic deficits are appreciated. Somewhat slow to answer questions which is apparently new Skin:  Skin is warm, dry and intact. No rash noted. Psychiatric: Mood and affect  are normal. Speech and behavior are normal.    ED Results / Procedures / Treatments   Labs (all labs ordered are listed, but only abnormal results are displayed) Labs Reviewed  BASIC METABOLIC PANEL - Abnormal; Notable for the following components:      Result Value   Glucose, Bld 215 (*)    All other components within normal limits  CBC - Abnormal; Notable for the following components:   WBC 11.0 (*)    HCT 53.7 (*)    All other components within normal limits  BRAIN NATRIURETIC PEPTIDE - Abnormal; Notable for the following components:   B Natriuretic Peptide 271.2 (*)    All other components within normal limits  URINALYSIS, ROUTINE W REFLEX MICROSCOPIC  HEPATIC FUNCTION PANEL  BLOOD GAS, VENOUS  PROTIME-INR  CBG MONITORING, ED  TROPONIN I (HIGH SENSITIVITY)     EKG  ED ECG REPORT I, Willy Eddy, the attending physician, personally viewed and interpreted this ECG.   Date: 09/30/2021  EKG Time: 10:53  Rate: 100  Rhythm: sinus  Axis: normal  Intervals:normal  ST&T Change: no stemi, no depression, poor  r  wave progression, motion artifact    RADIOLOGY Please see ED Course for my review and interpretation.  I personally reviewed all radiographic images ordered to evaluate for the above acute complaints and reviewed radiology reports and findings.  These findings were personally discussed with the patient.  Please see medical record for radiology report.    PROCEDURES:  Critical Care performed:   .Critical Care Performed by: Willy Eddy, MD Authorized by: Willy Eddy, MD   Critical care provider statement:    Critical care time (minutes):  40   Critical care was necessary to treat or prevent imminent or life-threatening deterioration of the following conditions:  CNS failure or compromise   Critical care was time spent personally by me on the following activities:  Ordering and performing treatments and interventions, ordering and review of  laboratory studies, ordering and review of radiographic studies, pulse oximetry, re-evaluation of patient's condition, review of old charts, obtaining history from patient or surrogate, examination of patient, evaluation of patient's response to treatment, discussions with primary provider, discussions with consultants and development of treatment plan with patient or surrogate   MEDICATIONS ORDERED IN ED: Medications  ipratropium-albuterol (DUONEB) 0.5-2.5 (3) MG/3ML nebulizer solution 3 mL (has no administration in time range)  ipratropium-albuterol (DUONEB) 0.5-2.5 (3) MG/3ML nebulizer solution 3 mL (has no administration in time range)  clevidipine (CLEVIPREX) infusion 0.5 mg/mL (has no administration in time range)     IMPRESSION / MDM / ASSESSMENT AND PLAN / ED COURSE  I reviewed the triage vital signs and the nursing notes.                              Differential diagnosis includes, but is not limited to, chf, copd, sah, sdh, iph, dehydration, electrolyte abnormality, sepsis, anemia, withdrawal, malnutrition, deconditioning  Patient presents the ER with symptoms as described above.  He is afebrile mildly tachycardic mildly hypertensive denies any chest pain is somewhat short of breath appearing clinically we will give nebs since he does have a history of COPD.  EKG does not appear ischemic and he is denying any chest pain.  States that he simply fell did hit his head.  No other pain or discomfort.  States he is not interested in rehab or placement and would like to be discharged home if at all possible.  Patient's initial blood work does not show any significant anemia renal function looks normal.  I do appreciate wheezing on exam therefore will order nebs we will check blood gas will check cardiac enzymes will order CT head for the above differential.  Order chest x-ray.  Patient be signed out to oncoming physician pending reassessment.   Clinical Course as of 09/30/21 1546  Sat Sep 30, 2021  1528 Patient has evidence of acute head bleed by my review of CT imaging will await formal radiology report but I have already consulted neurosurgery for their recommendations. [PR]  1540 Discussed case with radiology who suspects this is more likely hemorrhagic stroke than traumatic hemorrhage therefore I consulted with neurology, Dr. Amada Jupiter who is recommending transfer to neuro ICU at [facility].  Has recommended Cleviprex for blood pressure control which I have ordered.  Updated patient's daughter regarding the patient's presentation.  Patient updated at bedside all of whom agree with plan. [PR]    Clinical Course User Index [PR] Willy Eddy, MD     FINAL CLINICAL IMPRESSION(S) / ED DIAGNOSES   Final diagnoses:  Fall, initial encounter  Shortness of breath  Right-sided nontraumatic intracerebral hemorrhage, unspecified cerebral location [facility])     Rx / DC Orders   ED Discharge Orders     None        Note:  This document was prepared using Dragon voice recognition software and may include unintentional dictation errors.      Willy Eddy, MD 09/30/21 [phone]

## 2021-09-30 NOTE — H&P (Signed)
Neurology H&P   CC: Transfer from outside hospital for intracranial hemorrhage  History is obtained from: Chart  HPI: Danny [name] Sr. is a 71 y.o. male past medical history of hypertension hyperlipidemia last seen normal couple of days ago, lives with his son but son is admitted in the ICU with an intracranial hemorrhage himself therefore he was home alone.  When he was checked upon by another family member, he was found down on the ground more confused than usual does have some history of dementia.  Family member who found him that brought him for evaluation at [facility] where a CT head was done that revealed a ICH with intraventricular extension. Transferred to [facility] for higher level of care. History obtained from the initial neurological consultation from Dr. Emilio Aspen detailed note from him also from earlier today LKW: 2 days ago tpa given?: no,  NIHSS: 2 ICH score: 1  ROS: Full ROS was performed and is negative except as noted in the HPI.  Past Medical History:  Diagnosis Date   CAD (coronary artery disease)    a. 01/2010 PCI of LAD; b. 09/2014 PCI/DES of mLAD due to ISR. D1 80, D2 80(jailed), LCX 28m; c. 07/2016 NSTEMI/Cath: LM nl, LAD 65m ISR, 40d, D1 90ost, 80p, D2 90ost, RI min irregs, LCX min irregs, OM1 90 small, OM2/3 min irregs, RCA min irregs, RPLB1 90, EF 25-35%.   Chronic combined systolic and diastolic CHF (congestive heart failure) ([place])    a. 07/2016 Echo: EF 30%, severe septal/anterior HK, Gr1 DD.   CKD (chronic kidney disease), stage III (HCC)    COPD (chronic obstructive pulmonary disease) (HCC)    DM2 (diabetes mellitus, type 2) (HCC)    Erectile dysfunction    HLD (hyperlipidemia)    HTN (hypertension)    Ischemic cardiomyopathy    a. 07/2016 Echo: EF 30% w/ sev septal/ant HK. Gr1 DD.    Family History  Problem Relation Age of Onset   Heart attack Father        complications   Cancer Brother     Social History:    reports that he quit smoking about 12 years ago. His smoking use included cigarettes. He smoked an average of 3.00 packs per day. He has never used smokeless tobacco. He reports current alcohol use. He reports that he does not use drugs.  Medications  Current Facility-Administered Medications:     stroke: mapping our early stages of recovery book, , Does not apply, Once, Kerney Elbe, MD   acetaminophen (TYLENOL) tablet 650 mg, 650 mg, Oral, Q4H PRN **OR** acetaminophen (TYLENOL) 160 MG/5ML solution 650 mg, 650 mg, Per Tube, Q4H PRN **OR** acetaminophen (TYLENOL) suppository 650 mg, 650 mg, Rectal, Q4H PRN, Kerney Elbe, MD   carvedilol (COREG) tablet 12.5 mg, 12.5 mg, Oral, BID WC, Kerney Elbe, MD   Derrill Memo ON 10/01/2021] Chlorhexidine Gluconate Cloth 2 % PADS 6 each, 6 each, Topical, Q0600, Kerney Elbe, MD   clevidipine (CLEVIPREX) infusion 0.5 mg/mL, 0-21 mg/hr, Intravenous, Continuous, Kerney Elbe, MD, Last Rate: 20 mL/hr at 09/30/21 1900, 10 mg/hr at [id number]   folic acid (FOLVITE) tablet 1 mg, 1 mg, Oral, Daily, Kerney Elbe, MD   gabapentin (NEURONTIN) capsule 300 mg, 300 mg, Oral, QHS, Lindzen, Eric, MD   insulin aspart (novoLOG) injection 0-15 Units, 0-15 Units, Subcutaneous, Q4H, Kerney Elbe, MD   LORazepam (ATIVAN) injection 1-2 mg, 1-2 mg, Intravenous, Q1H PRN, Kerney Elbe, MD   multivitamin with minerals tablet 1  tablet, 1 tablet, Oral, Daily, Kerney Elbe, MD   pantoprazole (PROTONIX) injection 40 mg, 40 mg, Intravenous, QHS, Kerney Elbe, MD   senna-docusate (Senokot-S) tablet 1 tablet, 1 tablet, Oral, BID, Kerney Elbe, MD   thiamine tablet 100 mg, 100 mg, Oral, Daily, Kerney Elbe, MD   Exam: Current vital signs: BP 134/85 (BP Location: Right Arm)    Pulse (!) 112    Temp 98.2 F (36.8 C) (Oral)    Resp 20    Ht 6' (1.829 m)    Wt 98.6 kg    SpO2 91%    BMI 29.48 kg/m  Vital signs in last 24 hours: Temp:  [97.6 F (36.4 C)-98.7 F (37.1 C)] 98.2 F  (36.8 C) (02/25 1958) Pulse Rate:  [47-124] 112 (02/25 2000) Resp:  [15-26] 20 (02/25 2000) BP: (118-208)/(67-156) 134/85 (02/25 2000) SpO2:  [60 %-100 %] 91 % (02/25 2000) FiO2 (%):  [100 %] 100 % (02/25 1650) Weight:  [98.6 kg] 98.6 kg (02/25 1800)  General: Developed well-nourished elderly man who appears a little bit agitated HEENT: Normocephalic/atraumatic CVS: Regular rate rhythm Abdomen nondistended nontender Neurologic exam Awake alert oriented to self Age wrong Month wrong Wrong year No dysarthria or aphasia Follows all commands Cranial nerves II to XII: Pupils round reactive but unequal-right pupil 5 mm react light, left 3 mm reactive to light, no restriction of extraocular movement, face symmetric, facial sensation intact. Motor examination with no drift in any of the 4-the left BKA Sensation intact Coordination: Significant intentional tremor bilaterally.  Grossly unchanged exam from earlier exam documented in the chart  Labs I have reviewed labs in epic and the results pertinent to this consultation are:   CBC    Component Value Date/Time   WBC 11.0 (H) 09/30/2021 1055   RBC 5.69 09/30/2021 1055   HGB 16.9 09/30/2021 1055   HGB 15.1 11/23/2014 2006   HCT 53.7 (H) 09/30/2021 1055   HCT 46.1 11/23/2014 2006   PLT 269 09/30/2021 1055   PLT 229 11/23/2014 2006   MCV 94.4 09/30/2021 1055   MCV 93 11/23/2014 2006   MCH 29.7 09/30/2021 1055   MCHC 31.5 09/30/2021 1055   RDW 13.3 09/30/2021 1055   RDW 12.7 11/23/2014 2006   LYMPHSABS 2.2 08/15/2021 1517   MONOABS 2.4 (H) 08/15/2021 1517   EOSABS 0.1 08/15/2021 1517   BASOSABS 0.1 08/15/2021 1517    CMP     Component Value Date/Time   NA 136 09/30/2021 1055   NA 135 11/23/2014 2006   K 4.1 09/30/2021 1055   K 4.0 11/23/2014 2006   CL 102 09/30/2021 1055   CL 100 (L) 11/23/2014 2006   CO2 23 09/30/2021 1055   CO2 24 11/23/2014 2006   GLUCOSE 215 (H) 09/30/2021 1055   GLUCOSE 370 (H) 11/23/2014 2006    BUN 23 09/30/2021 1055   BUN 40 (H) 11/23/2014 2006   CREATININE 0.94 09/30/2021 1055   CREATININE 2.16 (H) 11/23/2014 2006   CALCIUM 9.3 09/30/2021 1055   CALCIUM 8.6 (L) 11/23/2014 2006   PROT 7.2 09/30/2021 1055   PROT 7.3 11/23/2014 2006   ALBUMIN 4.0 09/30/2021 1055   ALBUMIN 4.0 11/23/2014 2006   AST 23 09/30/2021 1055   AST 18 11/23/2014 2006   ALT 25 09/30/2021 1055   ALT 14 (L) 11/23/2014 2006   ALKPHOS 98 09/30/2021 1055   ALKPHOS 96 11/23/2014 2006   BILITOT 0.8 09/30/2021 1055   BILITOT 0.6 11/23/2014 2006   GFRNONAA >  60 09/30/2021 1055   GFRNONAA 31 (L) 11/23/2014 2006   GFRAA 37 (L) 12/24/2019 1608   GFRAA 36 (L) 11/23/2014 2006    Lipid Panel     Component Value Date/Time   CHOL 96 10/26/2020 1048   TRIG 60 10/26/2020 1048   HDL 46 10/26/2020 1048   CHOLHDL 2.1 10/26/2020 1048   VLDL 12 10/26/2020 1048   LDLCALC 38 10/26/2020 1048   LDLDIRECT 64.0 03/20/2018 0950     Imaging I have reviewed the images obtained:  CT-head-small IPH adjacent to the body of the right lateral ventricle with intraventricular extension and the repeat CT head done without significant change.  Assessment: 71 year old man with last known well couple of days ago at least with more confusion than baseline-has history of baseline dementia.  Noted to have right subcortical small focus of intraparenchymal hemorrhage with extension into the intraventricular system. Most likely hypertensive bleed although possibility of ischemic stroke with hemorrhagic conversion and intraventricular extension remains as a possibility. When he was transferred to [facility], there was concern for anisocoria which I also witnessed and as documented above.  Otherwise exam remains unchanged.  CT imaging repeated-unchanged size of the bleed.  Impression: ICH and IVH-likely secondary to hypertension  Recommendations: Admit to neuro ICU Frequent neurochecks Strictly no antiplatelets or  anticoagulants Systolic blood pressure goal between 130-150. Use Cleviprex drip as needed Gentle hydration with normal saline 75 cc an hour MRI brain with and without contrast when able to CT angio head and neck when able to Breathing well and saturating normally on room air Remains somewhat agitated due to baseline history of dementia.  Will order Seroquel at night as needed.   PT OT speech therapy Repeat stat CT if there is any deterioration in neurological exam Please call neurology with questions or concerns if there are any exam changes.  SCD for DVT prophylaxis Docusate senna for bowel regimen Protonix for GI prophylaxis CODE STATUS: Full code  -- Amie Portland, MD Neurologist [facility] Pager: [phone]  CRITICAL CARE ATTESTATION Performed by: Amie Portland, MD Total critical care time: 40 minutes Critical care time was exclusive of separately billable procedures and treating other patients and/or supervising APPs/Residents/Students Critical care was necessary to treat or prevent imminent or life-threatening deterioration due to intracerebral hemorrhage, intraventricular hemorrhage This patient is critically ill and at significant risk for neurological worsening and/or death and care requires constant monitoring. Critical care was time spent personally by me on the following activities: development of treatment plan with patient and/or surrogate as well as nursing, discussions with consultants, evaluation of patient's response to treatment, examination of patient, obtaining history from patient or surrogate, ordering and performing treatments and interventions, ordering and review of laboratory studies, ordering and review of radiographic studies, pulse oximetry, re-evaluation of patient's condition, participation in multidisciplinary rounds and medical decision making of high complexity in the care of this patient.

## 2021-10-01 ENCOUNTER — Encounter (HOSPITAL_COMMUNITY): Payer: Self-pay

## 2021-10-01 ENCOUNTER — Inpatient Hospital Stay (HOSPITAL_COMMUNITY): Payer: Medicare HMO

## 2021-10-01 DIAGNOSIS — I6389 Other cerebral infarction: Secondary | ICD-10-CM | POA: Diagnosis not present

## 2021-10-01 DIAGNOSIS — I610 Nontraumatic intracerebral hemorrhage in hemisphere, subcortical: Secondary | ICD-10-CM | POA: Diagnosis not present

## 2021-10-01 LAB — GLUCOSE, CAPILLARY
Glucose-Capillary: 152 mg/dL — ABNORMAL HIGH (ref 70–99)
Glucose-Capillary: 167 mg/dL — ABNORMAL HIGH (ref 70–99)
Glucose-Capillary: 168 mg/dL — ABNORMAL HIGH (ref 70–99)
Glucose-Capillary: 177 mg/dL — ABNORMAL HIGH (ref 70–99)
Glucose-Capillary: 194 mg/dL — ABNORMAL HIGH (ref 70–99)
Glucose-Capillary: 210 mg/dL — ABNORMAL HIGH (ref 70–99)

## 2021-10-01 LAB — ECHOCARDIOGRAM LIMITED
AR max vel: 3.61 cm2
AV Area VTI: 3.44 cm2
AV Area mean vel: 3.45 cm2
AV Mean grad: 2 mmHg
AV Peak grad: 3.3 mmHg
Ao pk vel: 0.91 m/s
Area-P 1/2: 5.97 cm2
Height: 72 in
S' Lateral: 3.9 cm
Weight: 3477.98 oz

## 2021-10-01 MED ORDER — FUROSEMIDE 20 MG PO TABS
20.0000 mg | ORAL_TABLET | ORAL | Status: DC
  Administered 2021-10-01: 20 mg via ORAL
  Filled 2021-10-01 (×3): qty 1

## 2021-10-01 MED ORDER — ESCITALOPRAM OXALATE 10 MG PO TABS
10.0000 mg | ORAL_TABLET | Freq: Every day | ORAL | Status: DC
  Administered 2021-10-01 – 2021-10-10 (×8): 10 mg via ORAL
  Filled 2021-10-01 (×8): qty 1

## 2021-10-01 MED ORDER — PERFLUTREN LIPID MICROSPHERE
1.0000 mL | INTRAVENOUS | Status: AC | PRN
  Administered 2021-10-01: 2 mL via INTRAVENOUS
  Filled 2021-10-01: qty 10

## 2021-10-01 MED ORDER — SACUBITRIL-VALSARTAN 49-51 MG PO TABS
1.0000 | ORAL_TABLET | Freq: Two times a day (BID) | ORAL | Status: DC
  Administered 2021-10-01 – 2021-10-02 (×3): 1 via ORAL
  Filled 2021-10-01 (×6): qty 1

## 2021-10-01 MED ORDER — ISOSORBIDE MONONITRATE ER 30 MG PO TB24
30.0000 mg | ORAL_TABLET | Freq: Every day | ORAL | Status: DC
  Administered 2021-10-01: 30 mg via ORAL
  Filled 2021-10-01: qty 1

## 2021-10-01 NOTE — Progress Notes (Signed)
Paged [name], NP about patient coming off of cleviprex and BP 110-120s. Verbal order for new SBP range to be 110-150. Will page NP if it drops lower than that.  Montez Hageman RN

## 2021-10-01 NOTE — Discharge Instructions (Signed)
                  Intensive Outpatient Programs  [facility]    [facility] [address]     [address] [place],  [place]     [place], [place] [phone]      [phone]  [facility]   [facility]  (Inpatient and outpatient)  [phone] (Suboxone and Methadone) [address]           [phone]           [facility]    [facility] - Intensive Outpatient [address]     [address] [place], [place] [address]     [place], [place]  [phone]      [phone]  [facility] (Outpatient, Inpatient, [facility]  [facility] (Groups and Residental) (insurance only) [phone]    [place], [place]          [phone]       [facility]    [facility] (for caregivers and family) [address]     [address] [place], [place]     [place], [place] [phone]      [phone]  Residential Treatment Programs  [facility]  [facility](2 years) Residential: 90 days)  [facility] [address]      [address] [place], [place]     [place], [place] [phone]      [phone] or [phone]  [facility]    [facility] [address]      [address] [place], [place]     [place], [place] [phone]      [phone]  [facility]   [facility] ([facility] [address]     [address] [place], [place] [address]     , [place] [phone]      [phone] Admissions: 8am-3pm M-F  [facility]: Residential Program (90 Days)              [facility]: [facility]  [place], [place]     [place], [place]  [phone] or [phone]    (Walk in Hours over the weekend or by referral)   [facility]: [facility]:[phone] (for crisis  response 24 hours a day) 

## 2021-10-01 NOTE — Progress Notes (Addendum)
STROKE TEAM PROGRESS NOTE   INTERVAL HISTORY No family was at the bedside. Patient is awake and alert. MRI pending for tomorrow. Head CT done today, showed improvement in the ventriculomegaly and stable hemorrhage  Vitals:   10/01/21 1400 10/01/21 1500 10/01/21 1557 10/01/21 1600  BP: (!) 144/94 125/72  (!) 142/111  Pulse: 94 (!) 105  (!) 108  Resp: (!) 24 20  (!) 23  Temp:   98.6 F (37 C)   TempSrc:   Oral   SpO2: 100% 96%  98%  Weight:      Height:       CBC:  Recent Labs  Lab 09/30/21 1055  WBC 11.0*  HGB 16.9  HCT 53.7*  MCV 94.4  PLT [id number]   Basic Metabolic Panel:  Recent Labs  Lab 09/30/21 1055  NA 136  K 4.1  CL 102  CO2 23  GLUCOSE 215*  BUN 23  CREATININE 0.94  CALCIUM 9.3   Lipid Panel: No results for input(s): CHOL, TRIG, HDL, CHOLHDL, VLDL, LDLCALC in the last 168 hours. HgbA1c: No results for input(s): HGBA1C in the last 168 hours. Urine Drug Screen: No results for input(s): LABOPIA, COCAINSCRNUR, LABBENZ, AMPHETMU, THCU, LABBARB in the last 168 hours.  Alcohol Level No results for input(s): ETH in the last 168 hours.  IMAGING past 24 hours DG Abd 1 View  Result Date: 10/01/2021 CLINICAL DATA:  Abdominal distention, CVA EXAM: ABDOMEN - 1 VIEW COMPARISON:  None. FINDINGS: Bowel gas pattern is nonspecific. Small amount of stool is seen in colon. No abnormal masses or calcifications are seen. Kidneys are partly obscured by bowel contents. Degenerative changes are noted in the lumbar spine. Arterial calcifications are seen. Vascular stent is seen in the left femoral artery. IMPRESSION: Nonspecific bowel gas pattern. Electronically Signed   By: Elmer Picker M.D.   On: 10/01/2021 14:36   CT HEAD WO CONTRAST (5MM)  Result Date: 10/01/2021 CLINICAL DATA:  Follow-up intracranial hemorrhage EXAM: CT HEAD WITHOUT CONTRAST TECHNIQUE: Contiguous axial images were obtained from the base of the skull through the vertex without intravenous contrast. RADIATION  DOSE REDUCTION: This exam was performed according to the departmental dose-optimization program which includes automated exposure control, adjustment of the mA and/or kV according to patient size and/or use of iterative reconstruction technique. COMPARISON:  Yesterday FINDINGS: Brain: Right corona radiata hemorrhage with extensive intraventricular extension, parenchymal component measuring 15 mm. Clot continues to the level of the lower fourth ventricle. Lateral ventricular dilatation measures a few mm less than before and there is less rounding of the lateral ventricles on coronal reformats. Stable periventricular low-density. Negative for cortical infarct. Retro cerebellar CSF accumulation, incidental. Vascular: No hyperdense vessel or unexpected calcification. Skull: Normal. Negative for fracture or focal lesion. Sinuses/Orbits: No acute finding. IMPRESSION: Unchanged parenchymal and intraventricular hemorrhage. Lateral ventriculomegaly is mildly improved. Electronically Signed   By: Jorje Guild M.D.   On: 10/01/2021 09:42   CT HEAD WO CONTRAST (5MM)  Result Date: 09/30/2021 CLINICAL DATA:  Hemorrhagic stroke, follow-up EXAM: CT HEAD WITHOUT CONTRAST TECHNIQUE: Contiguous axial images were obtained from the base of the skull through the vertex without intravenous contrast. RADIATION DOSE REDUCTION: This exam was performed according to the departmental dose-optimization program which includes automated exposure control, adjustment of the mA and/or kV according to patient size and/or use of iterative reconstruction technique. COMPARISON:  Earlier same day FINDINGS: Brain: Similar area of parenchymal hemorrhage within the periventricular white matter along the body of the right lateral  ventricle. Significant intraventricular hemorrhage is again identified. Stable hydrocephalus. No new hemorrhage. No new loss of gray-white differentiation. Stable findings of probable chronic microvascular ischemic changes in  the cerebral white matter. 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 Vascular: No new abnormality. Skull: Calvarium is unremarkable. Sinuses/Orbits: No acute finding. Other: None. IMPRESSION: No significant change in small parenchymal hemorrhage adjacent to the body of the right lateral ventricle. Similar intraventricular hemorrhage and mild communicating hydrocephalus. Electronically Signed   By: Macy Mis M.D.   On: 09/30/2021 19:49    PHYSICAL EXAM  Physical Exam  Constitutional: Appears well-developed and well-nourished.  Psych: Affect appropriate to situation Eyes: No scleral injection HENT: No OP obstrucion MSK: no joint deformities.bka left leg Cardiovascular: Normal rate and regular rhythm.  Respiratory: Effort normal, non-labored breathing GI: Soft.  No distension. There is no tenderness.  Skin: WDI  Neuro: Mental Status: Patient is awake, alert, oriented to person, Stated he was in the hospital, stated its 2024, knows name Patient is able to give a clear and coherent history. No signs of aphasia or neglect Cranial Nerves: II: Visual Fields are full. Rt pupil 5, left pupil 3 both react to light III,IV, VI: EOMI without ptosis or diploplia.  V: Facial sensation is symmetric to temperature VII: Facial movement is symmetric resting and smiling VIII: Hearing is intact to voice X: Palate elevates symmetrically XI: Shoulder shrug is symmetric. XII: Tongue protrudes midline without atrophy or fasciculations.  Motor: Tone is normal. Bulk is normal. 5/5 strength was present in all four extremities.  Bilateral tremor in upper extremities Sensory: Sensation is symmetric to light touch and temperature in the arms and legs. No extinction to DSS present.  Cerebellar: FNF and HKS are intact bilaterally  ASSESSMENT/PLAN [name] is a 71 y.o. male with history of HTN, HLD, COPD, and CHF presenting after being found down on the ground more confused than normal.  He lives with his son who was also admitted to the hospital, so the patient has not been seen by anyone for the last couple of days.  Repeat head CT today showed unchanged parenchymal and intraventricular hemorrhage. Lateral ventriculomegaly is mildly improved. MRI ordered for tomorrow morning. Hold ASA and plavix.   Stroke:  Acute IPH with IVH of the right lateral ventricle likely secondary hypertensive source Code Stroke Head CT- 12 mm focus of acute hemorrhage within the periventricular white matter adjacent to the body of the right lateral ventricle. Intraventricular hemorrhage appears to reflect extension of this hematoma. There is resulting mild communicating hydrocephalus. Repeat head CT- Unchanged parenchymal and intraventricular hemorrhage. Lateral ventriculomegaly is mildly improved. MRI  pending 2D Echo pending LDL 38 HgbA1c 7.4 VTE prophylaxis - SCDs aspirin 81 mg daily and clopidogrel 75 mg daily prior to admission, now on No antithrombotic. IPH with IVH Therapy recommendations:  pending Disposition:  pending  Hypertension Home meds:  lasix, aldactone, coreg Stable BP 130-150 for 24 hours, then relax goal to 160 if imaging is stable  Hyperlipidemia Home meds:  Atorvastatin 80mg  LDL 38, goal < 70  Resume when medically appropriate  Diabetes type II Controlled Home meds:  metformin, jardiance, trulicity 123456 7.4, goal < 7.0 CBGs SSI  Other Stroke Risk Factors Advanced Age >/= 33  Cigarette smoker, advised to stop smoking ETOH use, alcohol level No results found for requested labs within last 26280 hours., advised to drink no more than 2 drink(s) a day Coronary artery disease Previously on ASA and plavix Congestive  heart failure with reduced ejection fraction Home meds: Entresto, lasix, aldactone, jardiance, imdur- resumed  Other Active Problems BPH- flomax resumed Centrilobular emphysema CKD stage 3 Atherosclerosis or native arteries with intermittent  claudication Left BKA on 11/10/2020 with vascular surgery  Hospital day # 1  Patient seen and examined by NP/APP with MD. MD to update note as needed.   [name], DNP, FNP-BC [facility] Pager: [phone]  ATTENDING ATTESTATION:  71 year old gentleman with a past medical history of hyperlipidemia hypertension who lives with his son but he is in the ICU and was left home alone.  He was found down on the floor for unknown period of time.  Went to [facility] and CT showed intracranial hemorrhage and transferred to [facility].  Etiology is likely hypertensive.  There are some concern for anisocoria and CT head was repeated and was stable.  On exam he is mentating well he is easily arousable and oriented to person place and year.  He is following commands.  And exam is nonfocal.  He is placed on Cleviprex drip.  He will get MRI brain tomorrow.  He is on psychotropic medications and is on Seroquel at night as needed.  Dr. Reeves Forth evaluated pt independently, reviewed imaging, chart, labs. Discussed and formulated plan with the APP. Please see APP note above for details.     This patient is critically ill due to [place] and at significant risk of neurological worsening, death form heart failure, respiratory failure, recurrent stroke, bleeding from [facility], seizure, sepsis. This patient's care requires constant monitoring of vital signs, hemodynamics, respiratory and cardiac monitoring, review of multiple databases, neurological assessment, discussion with family, other specialists and medical decision making of high complexity. I spent 35 minutes of neurocritical care time in the care of this patient.   Allyn Bertoni,MD    To contact Stroke Continuity provider, please refer to [email or web address] After hours, contact General Neurology

## 2021-10-01 NOTE — Progress Notes (Signed)
PT Cancellation Note  Patient Details Name: Danny XUE Sr. MRN: [id number] DOB: [birth year]   Cancelled Treatment:    Reason Eval/Treat Not Completed: Active bedrest order. Pt on bedrest until 647 PM per orders. PT will plan on following up Monday unless bedrest orders are removed earlier.   Arlyss Gandy 10/01/2021, 7:46 AM

## 2021-10-01 NOTE — Progress Notes (Signed)
°  Transition of Care [facility]) Screening Note   Patient Details  Name: Danny STEINFELDT Sr. Date of Birth: [birth year]   Transition of Care [facility]) CM/SW Contact:    Windle Guard, LCSW Phone Number: 10/01/2021, 8:10 AM    Transition of Care Department [facility]) has reviewed patient and noted no immediate TOC needs pending continued medical work-up. TOC team will continue to monitor patient advancement through interdisciplinary progression rounds to support any identified discharge supports as needed. If new patient transition needs arise, please place a TOC consult or reach out to [facility] team.

## 2021-10-01 NOTE — Progress Notes (Signed)
OT Cancellation Note  Patient Details Name: Danny [name] Sr. MRN: [id number] DOB: 1951-01-28   Cancelled Treatment:    Reason Eval/Treat Not Completed: Active bedrest order (Will return as schedule allows.)  March Joos M Clayton Bosserman Gisela Lea MSOT, OTR/L Acute Rehab Office: [phone] 10/01/2021, 7:12 AM

## 2021-10-01 NOTE — Evaluation (Signed)
Speech Language Pathology Evaluation Patient Details Name: Danny BERHANE Sr. MRN: [id number] DOB: [birth year] Today's Date: 10/01/2021 Time: 1110-1130 SLP Time Calculation (min) (ACUTE ONLY): 20 min  Problem List:  Patient Active Problem List   Diagnosis Date Noted   ICH (intracerebral hemorrhage) ([place]) 09/30/2021   SIRS (systemic inflammatory response syndrome) ([place]) 08/15/2021   Irregular heart beat 01/24/2021   Hx of BKA, left ([place]) 12/16/2020   Reactive thrombocytosis 123456   Chronic systolic heart failure (HCC)    Cellulitis of left foot 11/09/2020   HFrEF (heart failure with reduced ejection fraction) ([place])    Thrush    Sepsis ([place]) 11/06/2020   Gangrene of left foot (HCC)    PVD (peripheral vascular disease) ([place])    Stage 2 chronic kidney disease    Atherosclerosis of native arteries of the extremities with gangrene ([place]) 10/28/2020   Low back pain 06/29/2020   Onychomycosis 03/29/2020   Fall 03/29/2020   AKI (acute kidney injury) ([place]) 12/28/2019   Coronary artery disease of native artery of native heart with stable angina pectoris ([place]) 12/12/2018   Pure hypercholesterolemia 12/12/2018   Chest pain 10/31/2018   Atherosclerosis of native arteries of extremity with intermittent claudication ([place]) 02/11/2018   COPD with acute exacerbation ([place]) 11/26/2017   Decreased pedal pulses 07/02/2017   Orthostasis 05/07/2017   Anxiety and depression 05/07/2017   CKD (chronic kidney disease) stage 3, GFR 30-59 ml/min (HCC) 03/06/2017   Diabetic neuropathy (HCC) 03/06/2017   Chronic diastolic CHF (congestive heart failure) ([place]) 03/05/2017   Morbid obesity ([place]) 06/18/2016   H/O medication noncompliance 06/18/2016   COPD with chronic bronchitis ([place]) 06/20/2015   GERD (gastroesophageal reflux disease) 12/20/2014   Type 2 diabetes mellitus with hyperlipidemia ([place]) 06/05/2010   Hyperlipidemia 02/10/2010   Essential hypertension 02/10/2010   CAD (coronary artery disease)  02/10/2010   Past Medical History:  Past Medical History:  Diagnosis Date   CAD (coronary artery disease)    a. 01/2010 PCI of LAD; b. 09/2014 PCI/DES of mLAD due to ISR. D1 80, D2 80(jailed), LCX 64m; c. 07/2016 NSTEMI/Cath: LM nl, LAD 51m ISR, 40d, D1 90ost, 80p, D2 90ost, RI min irregs, LCX min irregs, OM1 90 small, OM2/3 min irregs, RCA min irregs, RPLB1 90, EF 25-35%.   Chronic combined systolic and diastolic CHF (congestive heart failure) ([place])    a. 07/2016 Echo: EF 30%, severe septal/anterior HK, Gr1 DD.   CKD (chronic kidney disease), stage III (HCC)    COPD (chronic obstructive pulmonary disease) (HCC)    DM2 (diabetes mellitus, type 2) (HCC)    Erectile dysfunction    HLD (hyperlipidemia)    HTN (hypertension)    Ischemic cardiomyopathy    a. 07/2016 Echo: EF 30% w/ sev septal/ant HK. Gr1 DD.   Past Surgical History:  Past Surgical History:  Procedure Laterality Date   AMPUTATION Left 11/10/2020   Procedure: AMPUTATION BELOW KNEE;  Surgeon: [name], MD;  Location: [facility] ORS;  Service: General;  Laterality: Left;   BELOW KNEE LEG AMPUTATION     CAD: stent to the LAD     CARDIAC CATHETERIZATION  10/01/2014   CARDIAC CATHETERIZATION N/A 07/23/2016   Procedure: Left Heart Cath and Coronary Angiography;  Surgeon: [name], MD;  Location: [place] CV LAB;  Service: Cardiovascular;  Laterality: N/A;   CORONARY ANGIOPLASTY WITH STENT PLACEMENT  10/01/2014   LOWER EXTREMITY ANGIOGRAPHY Left 10/31/2020   Procedure: LOWER EXTREMITY ANGIOGRAPHY;  Surgeon: [name], MD;  Location: [place] CV LAB;  Service: Cardiovascular;  Laterality: Left;   HPI:  Patient is a 71 y.o. male with PMH: HTN, HLD, dementia, COPD, HLD, CAD, DM-2. Typically he has supervision and assistance from his son, but unfortunately his son was admitted and continues to be admitted at hospital after suffering intracranial hemorrhage. Patient admitted to hospital after a family member who was  checking on him found him down on the ground and more confused than usual. Family member brought him to [facility] for evaluation and CT head revealed [place] with intraventricular extension. Patient was then transferred to [facility].   Assessment / Plan / Recommendation Clinical Impression  Patient presents with moderately impaired cognitive function however he does have reported baseline dementia and per chart review, his son lives with him and provides 24/7 supervision. Unfortunately, patient's son had an [place] and is currently still a patient at [facility]. Upon SLP entering room, patient alert, stating he is having a bad headache but unable to rate level of pain on scale of 1-10. He was also asking about his phone as he wanted to call his son and daughter. Patient was oriented to self and was aware he was in a hospital but stated "Danny [name]" (he was initially admitted at [facility]). He stated year as "2001" but was correct with his current age. 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 His did not exhibit any oral motor weakness but his speech was very difficult to understand at times and SLP suspecting he may be at or near baseline. (of note he is edentulous). Patient was easily distracted and quickly became tangential with divergent naming task. He was able to demonstrate immediate recall of 3 of 5 words but no delayed recall. He is not aware that his son is still in the hospital as he reported his son "had a stroke last night"  and that his left arm was affected but when asked where he was he said, "He's home". Overall, patient exhibiting deficits in memory, awareness, problem solving, initiating and ability to maintain topic/thought. Difficult to determine how far off baseline patient is but as he previously was getting supevision from his son and has reported h/o dementia, recommendation is for brief f/u while patient in acute care and SNF level SLP  services at discharge.    SLP Assessment  SLP Recommendation/Assessment: Patient needs continued Speech [place] Pathology Services SLP Visit Diagnosis: Cognitive communication deficit (R41.841)    Recommendations for follow up therapy are one component of a multi-disciplinary discharge planning process, led by the attending physician.  Recommendations may be updated based on patient status, additional functional criteria and insurance authorization.    Follow Up Recommendations  Skilled nursing-short term rehab (<3 hours/day)    Assistance Recommended at Discharge  Frequent or constant Supervision/Assistance  Functional Status Assessment Patient has had a recent decline in their functional status and demonstrates the ability to make significant improvements in function in a reasonable and predictable amount of time.  Frequency and Duration min 1 x/week  1 week      SLP Evaluation Cognition  Overall Cognitive Status: No family/caregiver present to determine baseline cognitive functioning Arousal/Alertness: Awake/alert Orientation Level: Oriented to person;Disoriented to place;Disoriented to time;Disoriented to situation;Other (comment) (aware he was at the hospital but stated "[place]") Year: Other (Comment) (2001) Day of Week: Incorrect Attention: Sustained Sustained Attention: Impaired Sustained Attention Impairment: Verbal basic;Functional basic Memory: Impaired Memory Impairment: Storage deficit;Retrieval deficit;Decreased  recall of new information Awareness: Impaired Awareness Impairment: Intellectual impairment Problem Solving: Impaired Problem Solving Impairment: Functional basic Executive Function: Initiating Initiating: Impaired Initiating Impairment: Functional basic Behaviors: Perseveration Safety/Judgment: Impaired Comments: Patient did not demonstrate awareness to why he is in the hospital and he is unaware that his son is still in the hospital.        Comprehension  Auditory Comprehension Overall Auditory Comprehension: Impaired at baseline    Expression Expression Primary Mode of Expression: Verbal Verbal Expression Overall Verbal Expression: Appears within functional limits for tasks assessed   Oral / Motor  Oral Motor/Sensory Function Overall Oral Motor/Sensory Function: Within functional limits Motor Speech Overall Motor Speech: Other (comment) (suspect he is at or near his baseline speech) Respiration: Within functional limits Resonance: Within functional limits Articulation: Impaired Intelligibility: Intelligibility reduced Word: 50-74% accurate Phrase: 50-74% accurate Sentence: Not tested Conversation: Not tested Motor Planning: Witnin functional limits Motor Speech Errors: Not applicable            Sonia Baller, MA, CCC-SLP Speech Therapy

## 2021-10-02 ENCOUNTER — Inpatient Hospital Stay (HOSPITAL_COMMUNITY): Payer: Medicare HMO

## 2021-10-02 DIAGNOSIS — I610 Nontraumatic intracerebral hemorrhage in hemisphere, subcortical: Secondary | ICD-10-CM | POA: Diagnosis not present

## 2021-10-02 LAB — GLUCOSE, CAPILLARY
Glucose-Capillary: 149 mg/dL — ABNORMAL HIGH (ref 70–99)
Glucose-Capillary: 139 mg/dL — ABNORMAL HIGH (ref 70–99)
Glucose-Capillary: 132 mg/dL — ABNORMAL HIGH (ref 70–99)
Glucose-Capillary: 152 mg/dL — ABNORMAL HIGH (ref 70–99)
Glucose-Capillary: 136 mg/dL — ABNORMAL HIGH (ref 70–99)
Glucose-Capillary: 110 mg/dL — ABNORMAL HIGH (ref 70–99)

## 2021-10-02 MED ORDER — PANTOPRAZOLE SODIUM 40 MG PO TBEC
40.0000 mg | DELAYED_RELEASE_TABLET | Freq: Every day | ORAL | Status: DC
Start: 2021-10-02 — End: 2021-10-11
  Administered 2021-10-02 – 2021-10-10 (×8): 40 mg via ORAL
  Filled 2021-10-02 (×8): qty 1

## 2021-10-02 MED ORDER — SODIUM CHLORIDE 0.9 % IV SOLN: INTRAVENOUS | Status: DC

## 2021-10-02 MED ORDER — IOHEXOL 350 MG/ML SOLN
75.0000 mL | Freq: Once | INTRAVENOUS | Status: AC | PRN
  Administered 2021-10-02: 75 mL via INTRAVENOUS

## 2021-10-02 MED ORDER — LABETALOL HCL 5 MG/ML IV SOLN
10.0000 mg | INTRAVENOUS | Status: DC | PRN
Start: 2021-10-02 — End: 2021-10-11
  Administered 2021-10-05 – 2021-10-09 (×12): 10 mg via INTRAVENOUS
  Filled 2021-10-02 (×13): qty 4

## 2021-10-02 NOTE — Progress Notes (Signed)
PT Cancellation Note  Patient Details Name: Danny [name] Sr. MRN: [id number] DOB: [birth year]   Cancelled Treatment:    Reason Eval/Treat Not Completed: Patient at procedure or test/unavailable;Patient not medically ready - pt going for stat head CT, will check back tomorrow.   Stacie Glaze, PT DPT Acute Rehabilitation Services Pager [phone]  Office [phone]    Louis Matte 10/02/2021, 2:50 PM

## 2021-10-02 NOTE — Progress Notes (Signed)
Patient ID: Danny Organ Sr., male   DOB: June 14, 1951, 71 y.o.   MRN: [id number]  Contacted by ICU nurse. Pt was getting more confused and weaker. Initially, stat CT recommended but when NP and I came to eval pt. He was more awake now then earlier today before his CTA. CT head cancelled for now which is reasonable as there are multiple code strokes in the ER currently and Radiology is backed up.  Will start IV fluids and get speech to reval.

## 2021-10-02 NOTE — TOC CAGE-AID Note (Signed)
Transition of Care (TOC) - CAGE-AID Screening   Patient Details  Name: Danny SIRRINE Sr. MRN: [id number] Date of Birth: 1951/05/20  Transition of Care [facility]) CM/SW Contact:    Ella Bodo, RN Phone Number: 10/02/2021, 10:13 AM   Clinical Narrative: Pt admitted on 09/30/21 with  [place] and IVH-likely secondary to hypertension.  He is unable to participate in SA counseling due to AMS.   CAGE-AID Screening: Substance Abuse Screening unable to be completed due to: : Patient unable to participate (hx of dementia, AMS)   Reinaldo Raddle, RN, BSN  Trauma/Neuro ICU Case Manager [phone]

## 2021-10-02 NOTE — Progress Notes (Signed)
OT Cancellation Note  Patient Details Name: Danny RARICK Sr. MRN: [id number] DOB: [birth year]   Cancelled Treatment:    Reason Eval/Treat Not Completed: Medical issues which prohibited therapy-pt headed down for stat head CT per RN. Will check back on pt tomorrow.  Ignacia Palma, OTR/L Acute Rehab Services Pager [phone] Office [phone]    Evette Georges 10/02/2021, 2:50 PM

## 2021-10-02 NOTE — Progress Notes (Signed)
On-call overnight note  MRI completed. Called by neurorads Dr. Grace Isaac about results. IMPRESSION: 1. Absent flow void in the right vertebral and and proximal basilar artery with distal reconstitution. Fetal type appearing PCAs. This may be chronic given the clinical circumstances and lack of posterior circulation ischemia. 2. Punctate acute infarct in the right frontal cortex. Limited diffusion assessment adjacent to the right corona radiata hemorrhage due to susceptibility artifact. White matter infarct could be obscured at this level. 3. Unchanged hemorrhagic volume and ventriculomegaly.  I have not received any overnight communication about any exam change for him.  No stroke in the posterior circulation with the absent right vert flow void - may be chronic.  Punctate right frontal infarct likely incidental. Unchanged hemorrhage volume.  Will defer to day rounding team if they feel strongly about a CTA head ane neck to better evaluate posterior circulation but in absence of acute infarction, that flow void finding is likely of no clinical value at this time.  -- Milon Dikes, MD Neurologist [facility] Pager: [phone]

## 2021-10-02 NOTE — Progress Notes (Addendum)
STROKE TEAM PROGRESS NOTE   INTERVAL HISTORY Patient is seen in his room with no family at the bedside.  He is drowsy this morning likely secondary to ativan administration for MRI.  He has been hemodynamically stable.  CTA head performed today reveals occluded left V4 segment, likely chronic and stenosis of right vertebral artery with severe stenosis at V1 and V2 segments.  Vitals:   10/02/21 1145 10/02/21 1200 10/02/21 1215 10/02/21 1230  BP: 131/76 (!) 148/96 (!) 158/97 136/89  Pulse: (!) 103 (!) 102 98 (!) 103  Resp: 17 18 16 17   Temp:  98 F (36.7 C)    TempSrc:  Oral    SpO2: 93% 95% 95% 94%  Weight:      Height:       CBC:  Recent Labs  Lab 09/30/21 1055  WBC 11.0*  HGB 16.9  HCT 53.7*  MCV 94.4  PLT [id number]    Basic Metabolic Panel:  Recent Labs  Lab 09/30/21 1055  NA 136  K 4.1  CL 102  CO2 23  GLUCOSE 215*  BUN 23  CREATININE 0.94  CALCIUM 9.3    Lipid Panel: No results for input(s): CHOL, TRIG, HDL, CHOLHDL, VLDL, LDLCALC in the last 168 hours. HgbA1c: No results for input(s): HGBA1C in the last 168 hours. Urine Drug Screen: No results for input(s): LABOPIA, COCAINSCRNUR, LABBENZ, AMPHETMU, THCU, LABBARB in the last 168 hours.  Alcohol Level No results for input(s): ETH in the last 168 hours.  IMAGING past 24 hours CT ANGIO HEAD W OR WO CONTRAST  Result Date: 10/02/2021 CLINICAL DATA:  Hemorrhagic stroke, follow-up EXAM: CT ANGIOGRAPHY HEAD AND NECK TECHNIQUE: Multidetector CT imaging of the head and neck was performed using the standard protocol during bolus administration of intravenous contrast. Multiplanar CT image reconstructions and MIPs were obtained to evaluate the vascular anatomy. Carotid stenosis measurements (when applicable) are obtained utilizing NASCET criteria, using the distal internal carotid diameter as the denominator. RADIATION DOSE REDUCTION: This exam was performed according to the departmental dose-optimization program which includes  automated exposure control, adjustment of the mA and/or kV according to patient size and/or use of iterative reconstruction technique. CONTRAST:  33mL OMNIPAQUE IOHEXOL 350 MG/ML SOLN COMPARISON:  Brain MRI obtained earlier the same day, CT head dated 1 day prior FINDINGS: CTA NECK FINDINGS Aortic arch: There is mild calcified atherosclerotic plaque of the aortic arch. The origins of the major branch vessels are patent. The subclavian arteries are patent to the level imaged. There is a small outpouching likely reflecting a penetrating atherosclerotic ulcer along the posteromedial margin of the aortic arch [id number]). Right carotid system: The right common carotid artery is patent with mild soft and calcified plaque. There is calcified atherosclerotic plaque at the bifurcation without hemodynamically significant stenosis or occlusion. The internal and external carotid arteries are patent. There is no dissection or aneurysm. The right internal carotid artery has a partially medialized/retropharyngeal course. Left carotid system: The left common carotid artery is patent with scattered soft and calcified plaque. There is mixed plaque in the proximal left ICA resulting in less than 50% stenosis the distal left internal carotid artery is patent. The left external carotid artery is patent. Vertebral arteries: The right vertebral artery is patent at its origin. There is severe stenosis in the V1 segment primarily due to soft plaque (9-277). There is reconstitution of flow in the distal V1 segment. There is scattered plaque throughout the remainder of the vertebral artery resulting in additional moderate  to severe stenoses at the C5-C6 and C4-C5 levels (9-244, 9-241, 10-127). There is severe stenosis at the origin of the left vertebral artery (10-140). The left vertebral artery is otherwise patent. There is no evidence of dissection or aneurysm. Skeleton: There is mild multilevel degenerative change of the cervical spine. There  is no acute osseous abnormality or aggressive osseous lesion. Sclerotic lesions in the right and left frontal calvarium for present in 2018. Other neck: The soft tissues are unremarkable. Upper chest: There is emphysema and scarring in the lung apices. Review of the MIP images confirms the above findings CTA HEAD FINDINGS Anterior circulation: Is calcified atherosclerotic plaque in the bilateral intracranial ICAs resulting in up to mild stenosis on the right The bilateral MCAs are patent. The bilateral ACAs are patent. The anterior communicating artery is normal. There is no aneurysm or AVM. Posterior circulation: There is mild stenosis of the right vertebral artery at the PICA origin. The right vertebral artery is otherwise patent through the basilar artery. The left vertebral artery is occluded after the PICA origin. The basilar artery is patent with mild multifocal irregularity but no high-grade stenosis or occlusion. There are prominent bilateral posterior communicating arteries which primarily supply the posterior cerebral arteries, with small P1 segments bilaterally. The bilateral PCAs are patent There is no aneurysm or AVM. Venous sinuses: Not well evaluated due to bolus timing. Anatomic variants: Above. The intraparenchymal and intraventricular hemorrhage appears similar to the prior study from 1 day prior. The ventricles are stable in size. Review of the MIP images confirms the above findings IMPRESSION: 1. No culprit aneurysm or AVM identified. 2. Multifocal stenosis of the right vertebral artery with severe stenoses at the V1 and V2 segments. 3. Occluded left V4 segment after the PICA origin, likely chronic. Otherwise, patent intracranial vasculature with the PCAs primarily supplied by prominent posterior communicating arteries. 4. Mild calcified atherosclerotic plaque at the carotid bifurcations and intracranial ICAs without hemodynamically significant stenosis or occlusion. 5. Small probable penetrating  atherosclerotic ulcer along the posteromedial margin of the aortic arch. Aortic Atherosclerosis (ICD10-I70.0) and Emphysema (ICD10-J43.9). Electronically Signed   By: Valetta Mole M.D.   On: 10/02/2021 12:00   CT ANGIO NECK W OR WO CONTRAST  Result Date: 10/02/2021 CLINICAL DATA:  Hemorrhagic stroke, follow-up EXAM: CT ANGIOGRAPHY HEAD AND NECK TECHNIQUE: Multidetector CT imaging of the head and neck was performed using the standard protocol during bolus administration of intravenous contrast. Multiplanar CT image reconstructions and MIPs were obtained to evaluate the vascular anatomy. Carotid stenosis measurements (when applicable) are obtained utilizing NASCET criteria, using the distal internal carotid diameter as the denominator. RADIATION DOSE REDUCTION: This exam was performed according to the departmental dose-optimization program which includes automated exposure control, adjustment of the mA and/or kV according to patient size and/or use of iterative reconstruction technique. CONTRAST:  3mL OMNIPAQUE IOHEXOL 350 MG/ML SOLN COMPARISON:  Brain MRI obtained earlier the same day, CT head dated 1 day prior FINDINGS: CTA NECK FINDINGS Aortic arch: There is mild calcified atherosclerotic plaque of the aortic arch. The origins of the major branch vessels are patent. The subclavian arteries are patent to the level imaged. There is a small outpouching likely reflecting a penetrating atherosclerotic ulcer along the posteromedial margin of the aortic arch [id number]). Right carotid system: The right common carotid artery is patent with mild soft and calcified plaque. There is calcified atherosclerotic plaque at the bifurcation without hemodynamically significant stenosis or occlusion. The internal and external carotid arteries are patent.  There is no dissection or aneurysm. The right internal carotid artery has a partially medialized/retropharyngeal course. Left carotid system: The left common carotid artery is patent  with scattered soft and calcified plaque. There is mixed plaque in the proximal left ICA resulting in less than 50% stenosis the distal left internal carotid artery is patent. The left external carotid artery is patent. Vertebral arteries: The right vertebral artery is patent at its origin. There is severe stenosis in the V1 segment primarily due to soft plaque (9-277). There is reconstitution of flow in the distal V1 segment. There is scattered plaque throughout the remainder of the vertebral artery resulting in additional moderate to severe stenoses at the C5-C6 and C4-C5 levels (9-244, 9-241, 10-127). There is severe stenosis at the origin of the left vertebral artery (10-140). The left vertebral artery is otherwise patent. There is no evidence of dissection or aneurysm. Skeleton: There is mild multilevel degenerative change of the cervical spine. There is no acute osseous abnormality or aggressive osseous lesion. Sclerotic lesions in the right and left frontal calvarium for present in 2018. Other neck: The soft tissues are unremarkable. Upper chest: There is emphysema and scarring in the lung apices. Review of the MIP images confirms the above findings CTA HEAD FINDINGS Anterior circulation: Is calcified atherosclerotic plaque in the bilateral intracranial ICAs resulting in up to mild stenosis on the right The bilateral MCAs are patent. The bilateral ACAs are patent. The anterior communicating artery is normal. There is no aneurysm or AVM. Posterior circulation: There is mild stenosis of the right vertebral artery at the PICA origin. The right vertebral artery is otherwise patent through the basilar artery. The left vertebral artery is occluded after the PICA origin. The basilar artery is patent with mild multifocal irregularity but no high-grade stenosis or occlusion. There are prominent bilateral posterior communicating arteries which primarily supply the posterior cerebral arteries, with small P1 segments  bilaterally. The bilateral PCAs are patent There is no aneurysm or AVM. Venous sinuses: Not well evaluated due to bolus timing. Anatomic variants: Above. The intraparenchymal and intraventricular hemorrhage appears similar to the prior study from 1 day prior. The ventricles are stable in size. Review of the MIP images confirms the above findings IMPRESSION: 1. No culprit aneurysm or AVM identified. 2. Multifocal stenosis of the right vertebral artery with severe stenoses at the V1 and V2 segments. 3. Occluded left V4 segment after the PICA origin, likely chronic. Otherwise, patent intracranial vasculature with the PCAs primarily supplied by prominent posterior communicating arteries. 4. Mild calcified atherosclerotic plaque at the carotid bifurcations and intracranial ICAs without hemodynamically significant stenosis or occlusion. 5. Small probable penetrating atherosclerotic ulcer along the posteromedial margin of the aortic arch. Aortic Atherosclerosis (ICD10-I70.0) and Emphysema (ICD10-J43.9). Electronically Signed   By: Valetta Mole M.D.   On: 10/02/2021 12:00   MR BRAIN WO CONTRAST  Result Date: 10/02/2021 CLINICAL DATA:  Hemorrhagic stroke EXAM: MRI HEAD WITHOUT CONTRAST TECHNIQUE: Multiplanar, multiecho pulse sequences of the brain and surrounding structures were obtained without intravenous contrast. COMPARISON:  Head CT from yesterday FINDINGS: Brain: Punctate acute infarct along the high right frontal cortex. Limited assessment of diffusion adjacent to the right corona radiata and intraventricular hemorrhage given susceptibility affects. A small white matter infarct could be obscured at this level. Subacute hemorrhage with methemoglobin showing is similar distribution of prior CT, seen in the lateral ventricles to fourth ventricle with lateral ventriculomegaly. Non worrisome retro cerebellar CSF collection. FLAIR hyperintensity in the periventricular white matter  at least partially from chronic small  vessel ischemia with chronic lacune is at the bilateral thalamus small volume subarachnoid hemorrhage is likely along the left parietal convexity, attributed to a clearing and redistribution. Non worrisome retro cerebellar CSF collection. Vascular: No visible flow in the right vertebral or basilar arteries until the distal basilar. There appears to be fetal type bilateral PCA flow. Case discussed with Dr. Rory Percy via telephone. Skull and upper cervical spine: Normal marrow signal. Sinuses/Orbits: Negative. IMPRESSION: 1. Absent flow void in the right vertebral and and proximal basilar artery with distal reconstitution. Fetal type appearing PCAs. This may be chronic given the clinical circumstances and lack of posterior circulation ischemia. 2. Punctate acute infarct in the right frontal cortex. Limited diffusion assessment adjacent to the right corona radiata hemorrhage due to susceptibility artifact. White matter infarct could be obscured at this level. 3. Unchanged hemorrhagic volume and ventriculomegaly. Electronically Signed   By: Jorje Guild M.D.   On: 10/02/2021 04:56    PHYSICAL EXAM  Physical Exam  Constitutional: Appears well-developed and well-nourished.  Psych: Affect appropriate to situation Eyes: No scleral injection HENT: No OP obstrucion MSK: no joint deformities.bka left leg Cardiovascular: Normal rate and regular rhythm.  Respiratory: Effort normal, non-labored breathing Skin: WDI  Neuro: Mental Status: Patient is very drowsy today but responds to name No signs of aphasia or neglect Cranial Nerves: II: PERRL III,IV, VI: EOMI without ptosis or diploplia.  V: Facial sensation is symmetric to light touch VII: Facial movement is symmetric resting and smiling VIII: Hearing is intact to voice XII: Tongue protrudes midline without atrophy or fasciculations.  Motor: Tone is normal. Bulk is normal. Follows commands with RUE and RLE, moves LUE spontaneously Sensory: Sensation is  symmetric to light touch in the arms and legs. No extinction to DSS present.    ASSESSMENT/PLAN Mr. Danny Dumitrescu Riera Sr. is a 71 y.o. male with history of HTN, HLD, COPD, and CHF presenting after being found down on the ground more confused than normal. He lives with his son who was also admitted to the hospital, so the patient has not been seen by anyone for the last couple of days.  Repeat head CT today showed unchanged parenchymal and intraventricular hemorrhage. Lateral ventriculomegaly is mildly improved. MRI ordered for tomorrow morning. Hold ASA and plavix. CTA head performed today reveals occluded left V4 segment, likely chronic and stenosis of right vertebral artery with severe stenosis at V1 and V2 segments.  Stroke:  Acute IPH with IVH of the right lateral ventricle likely secondary hypertensive source Code Stroke Head CT- 12 mm focus of acute hemorrhage within the periventricular white matter adjacent to the body of the right lateral ventricle. Intraventricular hemorrhage appears to reflect extension of this hematoma. There is resulting mild communicating hydrocephalus. Repeat head CT- Unchanged parenchymal and intraventricular hemorrhage. Lateral ventriculomegaly is mildly improved. CTA head and neck occluded left V4 segment, likely chronic and stenosis of right vertebral artery with severe stenosis at V1 and V2 segments. MRI  absent flow void in right vertebral and proximal basilar artery with distal reconstitution, fetal PCAs, punctate acute infarct in right frontal cortex, unchanged ICH 2D Echo no atrial level shunt, EF not reported LDL 38 HgbA1c 7.4 VTE prophylaxis - SCDs aspirin 81 mg daily and clopidogrel 75 mg daily prior to admission, now on No antithrombotic. IPH with IVH Therapy recommendations:  pending Disposition:  pending  Hypertension Home meds:  lasix, aldactone, coreg Stable BP relax goal to 160 as imaging  is stable  Hyperlipidemia Home meds:  Atorvastatin  80mg  LDL 38, goal < 70  Resume when medically appropriate  Diabetes type II Controlled Home meds:  metformin, jardiance, trulicity 123456 7.4, goal < 7.0 CBGs SSI  Other Stroke Risk Factors Advanced Age >/= 55  Cigarette smoker, advised to stop smoking ETOH use, alcohol level No results found for requested labs within last 26280 hours., advised to drink no more than 2 drink(s) a day Coronary artery disease Previously on ASA and plavix Congestive heart failure with reduced ejection fraction Home meds: Entresto, lasix, aldactone, jardiance, imdur- resumed  Other Active Problems BPH- flomax resumed Centrilobular emphysema CKD stage 3 Atherosclerosis or native arteries with intermittent claudication Left BKA on 11/10/2020 with vascular surgery  Hospital day # 2  Patient seen and examined by NP/APP with MD. MD to update note as needed.   [place] , MSN, AGACNP-BC [facility] See Amion for schedule and pager information 10/02/2021 2:04 PM  ATTENDING ATTESTATION:  Dr. Reeves Forth evaluated pt independently, reviewed imaging, chart, labs. Discussed and formulated plan with the APP. Please see APP note above for details.     71 year old gentleman with a past medical history of hyperlipidemia hypertension who lives with his son but he is in the ICU and was left home alone.  He was found down on the floor for unknown period of time.  Went to [facility] and CT showed intracranial hemorrhage and transferred to [facility].  Etiology is likely hypertensive.  He is more sedated on exam today likely due to Ativan administration for MRI overnight.  There is concern for possible flow-void in the posterior circulation.  CTA was ordered and shows right vertebral artery severe stenosis at V1 V2 occlusion of the left V4 segment after PICA origin.  It appears to be chronic and unrelated to his intracranial hemorrhage and incidental small right frontal stroke.  Will  discuss with IR about need for angiogram.    This patient is critically ill due to respiratory distress, agitation from ETOH abuse and ICH  and at significant risk of neurological worsening, death form heart failure, respiratory failure, recurrent stroke, bleeding from [facility], seizure, sepsis. This patient's care requires constant monitoring of vital signs, hemodynamics, respiratory and cardiac monitoring, review of multiple databases, neurological assessment, discussion with family, other specialists and medical decision making of high complexity. I spent 35 minutes of neurocritical care time in the care of this patient.   Helena Sardo,MD     ADDENDUM: 2:25pm. Discussed with Dr. Theressa Stamps. Plan for angio after he is stable.  To contact Stroke Continuity provider, please refer to [email or web address] After hours, contact General Neurology

## 2021-10-02 NOTE — ED Notes (Signed)
Daughter Arline Asp) to come and get pt's phone. Phone placed with first nurse.

## 2021-10-02 NOTE — Progress Notes (Signed)
New L sided weakness and drift. Neuro MD and NP paged, orders received for STAT CT. Providers came to bedside and verbally cancelled CT.    10/02/21 1400  NIH Stroke Scale ( + Modified Stroke Scale Criteria)   Interval Neuro change  Level of Consciousness (1a.)    2  LOC Questions (1b. )   + 1  LOC Commands (1c. )   +  0  Best Gaze (2. )  + 0  Visual (3. )  + 0  Facial Palsy (4. )     0  Motor Arm, Left (5a. )   + 2  Motor Arm, Right (5b. )   + 0  Motor Leg, Left (6a. )   + UN  Motor Leg, Right (6b. )   + 0  Limb Ataxia (7. ) 1  Sensory (8. )   + 0  Best Language (9. )   + 0  Dysarthria (10. ) 1  Extinction/Inattention (11.)   + 0  Modified SS Total  + 3  Complete NIHSS TOTAL 7

## 2021-10-03 ENCOUNTER — Inpatient Hospital Stay (HOSPITAL_COMMUNITY): Payer: Medicare HMO

## 2021-10-03 ENCOUNTER — Ambulatory Visit: Payer: Medicaid Other | Admitting: Family Medicine

## 2021-10-03 DIAGNOSIS — I610 Nontraumatic intracerebral hemorrhage in hemisphere, subcortical: Secondary | ICD-10-CM | POA: Diagnosis not present

## 2021-10-03 LAB — BASIC METABOLIC PANEL
Anion gap: 12 (ref 5–15)
BUN: 39 mg/dL — ABNORMAL HIGH (ref 8–23)
CO2: 19 mmol/L — ABNORMAL LOW (ref 22–32)
Calcium: 8.8 mg/dL — ABNORMAL LOW (ref 8.9–10.3)
Chloride: 107 mmol/L (ref 98–111)
Creatinine, Ser: 1.31 mg/dL — ABNORMAL HIGH (ref 0.61–1.24)
GFR, Estimated: 59 mL/min — ABNORMAL LOW (ref >60)
Glucose, Bld: 108 mg/dL — ABNORMAL HIGH (ref 70–99)
Potassium: 3.8 mmol/L (ref 3.5–5.1)
Sodium: 138 mmol/L (ref 135–145)

## 2021-10-03 LAB — CBC
HCT: 49.5 % (ref 39.0–52.0)
Hemoglobin: 16.5 g/dL (ref 13.0–17.0)
MCH: 30.9 pg (ref 26.0–34.0)
MCHC: 33.3 g/dL (ref 30.0–36.0)
MCV: 92.7 fL (ref 80.0–100.0)
Platelets: 258 10*3/uL (ref 150–400)
RBC: 5.34 MIL/uL (ref 4.22–5.81)
RDW: 13.5 % (ref 11.5–15.5)
WBC: 12 10*3/uL — ABNORMAL HIGH (ref 4.0–10.5)
nRBC: 0 % (ref 0.0–0.2)

## 2021-10-03 LAB — GLUCOSE, CAPILLARY
Glucose-Capillary: 121 mg/dL — ABNORMAL HIGH (ref 70–99)
Glucose-Capillary: 121 mg/dL — ABNORMAL HIGH (ref 70–99)
Glucose-Capillary: 163 mg/dL — ABNORMAL HIGH (ref 70–99)
Glucose-Capillary: 122 mg/dL — ABNORMAL HIGH (ref 70–99)
Glucose-Capillary: 135 mg/dL — ABNORMAL HIGH (ref 70–99)
Glucose-Capillary: 120 mg/dL — ABNORMAL HIGH (ref 70–99)

## 2021-10-03 LAB — LIPID PANEL
Cholesterol: 112 mg/dL (ref 0–200)
HDL: 44 mg/dL (ref >40)
LDL Cholesterol: 39 mg/dL (ref 0–99)
Total CHOL/HDL Ratio: 2.5 RATIO
Triglycerides: 147 mg/dL (ref <150)
VLDL: 29 mg/dL (ref 0–40)

## 2021-10-03 LAB — HEMOGLOBIN A1C
Hgb A1c MFr Bld: 7.3 % — ABNORMAL HIGH (ref 4.8–5.6)
Mean Plasma Glucose: 162.81 mg/dL

## 2021-10-03 LAB — TRIGLYCERIDES: Triglycerides: 118 mg/dL (ref <150)

## 2021-10-03 MED ORDER — GABAPENTIN 250 MG/5ML PO SOLN
300.0000 mg | Freq: Every day | ORAL | Status: DC
  Administered 2021-10-03: 300 mg
  Filled 2021-10-03: qty 6

## 2021-10-03 MED ORDER — QUETIAPINE FUMARATE 25 MG PO TABS
50.0000 mg | ORAL_TABLET | Freq: Every day | ORAL | Status: DC
  Administered 2021-10-03: 50 mg via NASOGASTRIC
  Filled 2021-10-03: qty 2

## 2021-10-03 MED ORDER — SENNOSIDES-DOCUSATE SODIUM 8.6-50 MG PO TABS
1.0000 | ORAL_TABLET | Freq: Two times a day (BID) | ORAL | Status: DC
Start: 2021-10-03 — End: 2021-10-04
  Administered 2021-10-03: 1 via NASOGASTRIC
  Filled 2021-10-03 (×2): qty 1

## 2021-10-03 MED ORDER — ENOXAPARIN SODIUM 40 MG/0.4ML IJ SOSY
40.0000 mg | PREFILLED_SYRINGE | INTRAMUSCULAR | Status: DC
  Administered 2021-10-04 – 2021-10-06 (×3): 40 mg via SUBCUTANEOUS
  Filled 2021-10-03 (×3): qty 0.4

## 2021-10-03 MED ORDER — SACUBITRIL-VALSARTAN 49-51 MG PO TABS
1.0000 | ORAL_TABLET | Freq: Two times a day (BID) | ORAL | Status: DC
  Administered 2021-10-03: 1
  Filled 2021-10-03 (×2): qty 1

## 2021-10-03 MED ORDER — QUETIAPINE FUMARATE 25 MG PO TABS: 50.0000 mg | ORAL_TABLET | Freq: Every day | ORAL | Status: DC

## 2021-10-03 MED ORDER — ISOSORBIDE MONONITRATE 20 MG PO TABS
10.0000 mg | ORAL_TABLET | Freq: Three times a day (TID) | ORAL | Status: DC
  Filled 2021-10-03 (×3): qty 1

## 2021-10-03 MED ORDER — ISOSORBIDE MONONITRATE 20 MG PO TABS
10.0000 mg | ORAL_TABLET | Freq: Three times a day (TID) | ORAL | Status: DC
  Administered 2021-10-03: 10 mg via NASOGASTRIC
  Filled 2021-10-03 (×2): qty 1

## 2021-10-03 NOTE — Progress Notes (Addendum)
STROKE TEAM PROGRESS NOTE   INTERVAL HISTORY Patient is seen in his room with no family at the bedside.  He continues to be drowsy after receiving lorazepam again overnight.  He has been hemodynamically stable and his neurological exam is stable.  Will discontinue lorazepam and add Seroquel for nighttime agitation.  Will insert NG for enteral access.  Vitals:   10/03/21 1130 10/03/21 1145 10/03/21 1200 10/03/21 1215  BP:    128/65  Pulse: 98 92 91 92  Resp: 16 16 15 15   Temp:   98.8 F (37.1 C)   TempSrc:   Axillary   SpO2: 92% 92% 91% 93%  Weight:      Height:       CBC:  Recent Labs  Lab 09/30/21 1055 10/03/21 0230  WBC 11.0* 12.0*  HGB 16.9 16.5  HCT 53.7* 49.5  MCV 94.4 92.7  PLT 269 0000000    Basic Metabolic Panel:  Recent Labs  Lab 09/30/21 1055 10/03/21 0230  NA 136 138  K 4.1 3.8  CL 102 107  CO2 23 19*  GLUCOSE 215* 108*  BUN 23 39*  CREATININE 0.94 1.31*  CALCIUM 9.3 8.8*    Lipid Panel:  Recent Labs  Lab 10/03/21 1018  CHOL 112  TRIG 147  HDL 44  CHOLHDL 2.5  VLDL 29  LDLCALC 39   HgbA1c:  Recent Labs  Lab 10/03/21 1018  HGBA1C 7.3*   Urine Drug Screen: No results for input(s): LABOPIA, COCAINSCRNUR, LABBENZ, AMPHETMU, THCU, LABBARB in the last 168 hours.  Alcohol Level No results for input(s): ETH in the last 168 hours.  IMAGING past 24 hours DG Abd 1 View  Result Date: 10/03/2021 CLINICAL DATA:  NG tube placement EXAM: ABDOMEN - 1 VIEW COMPARISON:  10/01/2021 FINDINGS: Bowel gas pattern is nonspecific. No abnormal masses or calcifications are seen. Kidneys are partly obscured by bowel contents. Pelvis is not included in the images. Tip of enteric tube is seen in the distal thoracic esophagus. There are linear densities in the mid and lower lung fields. Emphysematous changes are noted in the apical regions of both lungs. Costophrenic angles are clear. IMPRESSION: Tip of enteric tube is seen in the lower thoracic esophagus and should be  advanced 10-15 cm to place the tip and side port within the stomach. Bowel gas pattern is nonspecific. There are increased markings in both mid and both lower lung fields suggesting atelectasis/pneumonia. Part of this finding may suggest underlying scarring. Electronically Signed   By: Elmer Picker M.D.   On: 10/03/2021 10:59    PHYSICAL EXAM  Physical Exam  Constitutional: Appears well-developed and well-nourished.  Psych: Affect appropriate to situation Eyes: No scleral injection HENT: No OP obstrucion MSK: no joint deformities.bka left leg Cardiovascular: Normal rate and regular rhythm.  Respiratory: Effort normal, non-labored breathing Skin: WDI  Neuro: Mental Status: Patient is very drowsy today but responds to name No signs of aphasia or neglect Cranial Nerves: II: PERRL III,IV, VI: EOMI without ptosis or diploplia.  V: Facial sensation is symmetric to light touch VII: Facial movement is symmetric resting and smiling VIII: Hearing is intact to voice   Motor: Tone is normal. Bulk is normal. Moves all extremities to noxious stimuli Sensory: MAE to noxious stimuli   ASSESSMENT/PLAN Mr. Dam Brueggeman Guterrez Sr. is a 71 y.o. male with history of HTN, HLD, COPD, and CHF presenting after being found down on the ground more confused than normal. He lives with his son who was  also admitted to the hospital, so the patient has not been seen by anyone for the last couple of days.  Repeat head CT today showed unchanged parenchymal and intraventricular hemorrhage. Lateral ventriculomegaly is mildly improved. MRI ordered for tomorrow morning. Hold ASA and plavix. CTA head  reveals occluded left V4 segment, likely chronic and stenosis of right vertebral artery with severe stenosis at V1 and V2 segments.Will discontinue lorazepam and add Seroquel for nighttime agitation.  Will insert NG for enteral access.  Stroke:  Acute IPH with IVH of the right lateral ventricle likely secondary  hypertensive source Code Stroke Head CT- 12 mm focus of acute hemorrhage within the periventricular white matter adjacent to the body of the right lateral ventricle. Intraventricular hemorrhage appears to reflect extension of this hematoma. There is resulting mild communicating hydrocephalus. Repeat head CT- Unchanged parenchymal and intraventricular hemorrhage. Lateral ventriculomegaly is mildly improved. CTA head and neck occluded left V4 segment, likely chronic and stenosis of right vertebral artery with severe stenosis at V1 and V2 segments. MRI  absent flow void in right vertebral and proximal basilar artery with distal reconstitution, fetal PCAs, punctate acute infarct in right frontal cortex, unchanged ICH 2D Echo no atrial level shunt, EF not reported LDL 38 HgbA1c 7.4 VTE prophylaxis - SCDs, started lovenox today. aspirin 81 mg daily and clopidogrel 75 mg daily prior to admission, now on No antithrombotic. IPH with IVH Therapy recommendations:  SNF Disposition:  pending  Hypertension Home meds:  lasix, aldactone, coreg Stable BP relax goal to 160 as imaging is stable  Hyperlipidemia Home meds:  Atorvastatin 80mg  LDL 38, goal < 70  Resume when medically appropriate  Diabetes type II Controlled Home meds:  metformin, jardiance, trulicity 123456 7.4, goal < 7.0 CBGs SSI  Other Stroke Risk Factors Advanced Age >/= 82  Cigarette smoker, advised to stop smoking ETOH use, alcohol level No results found for requested labs within last 26280 hours., advised to drink no more than 2 drink(s) a day Coronary artery disease Previously on ASA and plavix Congestive heart failure with reduced ejection fraction Home meds: Entresto, lasix, aldactone, jardiance, imdur- resumed  Other Active Problems BPH- flomax resumed Centrilobular emphysema CKD stage 3 Atherosclerosis or native arteries with intermittent claudication Left BKA on 11/10/2020 with vascular surgery  Hospital day #  3  Patient seen and examined by NP/APP with MD. MD to update note as needed.   [place] , MSN, AGACNP-BC [facility] See Amion for schedule and pager information 10/03/2021 2:25 PM   ATTENDING ATTESTATION:   Dr. Reeves Forth evaluated pt independently, reviewed imaging, chart, labs. Discussed and formulated plan with the APP. Please see APP note above for details.       71 year old gentleman with a past medical history of hyperlipidemia hypertension who lives with his son but he is in the ICU and was left home alone.  He was found down on the floor for unknown period of time.  Went to [facility] and CT showed intracranial hemorrhage and transferred to [facility].  Etiology is likely hypertensive.   chronic right vertebral artery severe stenosis at V1 V2 occlusion of the left V4 segment after PICA origin.  It appears to be chronic and unrelated to his intracranial hemorrhage. Angiogram when he is more stable per Dr. Estanislado Pandy. NGT today to start meds. Seroquel qhs to help with agitation. Stop ativan prn to avoid oversedation. Call neuro if ativan required, consider clexiprex. Lovenox VTE prophy started today.  This patient is critically ill due to respiratory distress, agitation from ETOH abuse and ICH  and at significant risk of neurological worsening, death form heart failure, respiratory failure, recurrent stroke, bleeding from [facility], seizure, sepsis. This patient's care requires constant monitoring of vital signs, hemodynamics, respiratory and cardiac monitoring, review of multiple databases, neurological assessment, discussion with family, other specialists and medical decision making of high complexity. I spent 35 minutes of neurocritical care time in the care of this patient.    Rosamary Boudreau,MD    To contact Stroke Continuity provider, please refer to [email or web address] After hours, contact General Neurology

## 2021-10-03 NOTE — Progress Notes (Signed)
SLP Cancellation Note  Patient Details Name: Danny MAN Sr. MRN: [id number] DOB: [birth year]   Cancelled treatment:        Attempted to see pt for swallowing evaluation. Pt extremely lethargic 2/2 sedative medication given overnight.  Pt unresponsive to sternal rub and cold washcloth applied to face.  Pt withdrew to thermal stimulation to lips (ice chip).  Pt is not appropriate for PO trials at this time.   Recommend NPO with short term alternate means of nutrition, hydration, and medication.  SLP to follow for  PO readiness   Kerrie Pleasure, MA, CCC-SLP Acute Rehabilitation Services Office: [phone]  10/03/2021, 10:00 AM

## 2021-10-03 NOTE — Evaluation (Addendum)
Physical Therapy Evaluation Patient Details Name: Danny BADOLATO Sr. MRN: [id number] DOB: 1950-12-16 Today's Date: 10/03/2021  History of Present Illness  Danny BINGLEY Sr. is a 71 y.o. male who presented to [facility] after being found down by family. CT: positive for R IVH.  Transferred to [facility] MRI: Punctate acute infarct in the right frontal cortex and unchanged hemorrhagic volume and ventriculomegaly. PHMx: hypertension, COPD, DM, CAD CHF, PAD s/p L BKA 11/2020, anddementia.  Clinical Impression  Pt exhibited global weakness with UE and LE bilaterally but pt's effort was questionable with inattention and lethargy. Sensation testing was attempted but was inconclusive. He required a lot of constant cuing with all movements. Pt was max to total assist with bed mobility. During physical exam patient showed deficits in strength, endurance, activity tolerance. Based on medical situation and unknown living situation, currently recommending therapy services at skilled nursing facility to address the previously stated deficits. Will continue to follow acutely to maximize functional mobility, independence and safety.     Recommendations for follow up therapy are one component of a multi-disciplinary discharge planning process, led by the attending physician.  Recommendations may be updated based on patient status, additional functional criteria and insurance authorization.  Follow Up Recommendations Skilled nursing-short term rehab (<3 hours/day)    Assistance Recommended at Discharge Frequent or constant Supervision/Assistance  Patient can return home with the following  Two people to help with walking and/or transfers;Two people to help with bathing/dressing/bathroom;Direct supervision/assist for medications management;Help with stairs or ramp for entrance;Assist for transportation;Assistance with feeding;Assistance with cooking/housework;Direct supervision/assist for financial  management    Equipment Recommendations Other (comment) (Unsure of what DME the pt has access to. Will update equipment recomendations as pt progresses.)  Recommendations for Other Services       Functional Status Assessment Patient has had a recent decline in their functional status and demonstrates the ability to make significant improvements in function in a reasonable and predictable amount of time.     Precautions / Restrictions Precautions Precautions: Fall Restrictions Weight Bearing Restrictions: No      Mobility  Bed Mobility Overal bed mobility: Needs Assistance Bed Mobility: Rolling, Sidelying to Sit, Sit to Sidelying Rolling: Max assist Sidelying to sit: Total assist     Sit to sidelying: Total assist General bed mobility comments: Pt required simple cues for bed mobiltity and facilitation for pt to participate. Patient Response: Flat affect  Transfers                        Ambulation/Gait                  Stairs            Wheelchair Mobility    Modified Rankin (Stroke Patients Only)       Balance Overall balance assessment: Needs assistance Sitting-balance support: Feet supported, Bilateral upper extremity supported Sitting balance-[name]: Zero Sitting balance - Comments: Pt required max assist for pt to sit upright on EOB. He was able to take a cup an bring it close to his mouth with his left hand but had trouble getting the straw to his mouth. pt had left lateral lean in sitting balance for the majority of the time until cued to push through his left UE. Postural control: Left lateral lean     Standing balance comment: Standing was not attempted  Pertinent Vitals/Pain Pain Assessment Pain Assessment: No/denies pain    Home Living                     Additional Comments: Pt was not alert or oriented to be able to respond to questions about home situation. With his  son also in the ICU social situation is unclear.    Prior Function               Mobility Comments: Lives with his son but son is admitted in the ICU with an intracranial hemorrhage himself therefore he was home alone. ADLs Comments: Pt's son took care of most ADL's and house work, driving pt to appointments, and coordinating appointments. Pt was utilizing home health PT services to learn how to walk with is prostetic but was experiencing pain with weightbaring in his prostetic. He was able to walk household distances with physical assistance from the home health PT. Pt was able to be left home alone throughout the day.     Hand Dominance        Extremity/Trunk Assessment   Upper Extremity Assessment Upper Extremity Assessment: Generalized weakness;Difficult to assess due to impaired cognition    Lower Extremity Assessment Lower Extremity Assessment: Generalized weakness;LLE deficits/detail;RLE deficits/detail;Difficult to assess due to impaired cognition RLE Deficits / Details: Pt was unalbe to move R LE against gravitity. Pt was limited by lethargy. Sensation was unable to be assessed do to lethargy. LLE Deficits / Details: Pt has BKA on L LE. He was unable to lift against gravity. Pt limited by lethargy. Sensation was unable to be assessed do to lethargy.       Communication   Communication: Expressive difficulties (slurred speech)  Cognition Arousal/Alertness: Lethargic, Suspect due to medications Behavior During Therapy: Flat affect Overall Cognitive Status: No family/caregiver present to determine baseline cognitive functioning                                 General Comments: Pt has dementia at baseline. He could respond to simple questions 25% of the time with slurred speech. Talked to daughter on the phone, she said that dementia was not a major inpairment. She described her father's dementia as forgetting conversations here and there, forgeting the day of  the week and what appointments he had to go to.        General Comments General comments (skin integrity, edema, etc.): Pt demonstrated L inattention but could look left and move left arm with cuing. Spoke with daughter on the phone and she is unsure of a plan for pt upon discharge at this time.    Exercises     Assessment/Plan    PT Assessment Patient needs continued PT services  PT Problem List Decreased strength;Decreased mobility;Decreased range of motion;Decreased coordination;Decreased activity tolerance;Decreased cognition;Decreased balance;Decreased knowledge of use of DME;Impaired sensation;Decreased safety awareness       PT Treatment Interventions DME instruction;Therapeutic exercise;Balance training;Gait training;Stair training;Neuromuscular re-education;Cognitive remediation;Functional mobility training;Therapeutic activities;Patient/family education    PT Goals (Current goals can be found in the Care Plan section)  Acute Rehab PT Goals Patient Stated Goal: Maintain independance PT Goal Formulation: With patient Time For Goal Achievement: 10/03/21 Potential to Achieve Goals: Fair    Frequency Min 4X/week     Co-evaluation               AM-PAC PT "6 Clicks" Mobility  Outcome Measure Help needed turning  from your back to your side while in a flat bed without using bedrails?: A Lot Help needed moving from lying on your back to sitting on the side of a flat bed without using bedrails?: Total Help needed moving to and from a bed to a chair (including a wheelchair)?: Total Help needed standing up from a chair using your arms (e.g., wheelchair or bedside chair)?: Total Help needed to walk in hospital room?: Total Help needed climbing 3-5 steps with a railing? : Total 6 Click Score: 7    End of Session   Activity Tolerance: Patient tolerated treatment well;Patient limited by lethargy Patient left: in bed;with call bell/phone within reach;with bed alarm  set Nurse Communication: Mobility status PT Visit Diagnosis: Other symptoms and signs involving the nervous system (R29.898);Muscle weakness (generalized) (M62.81);History of falling (Z91.81)    Time: 5784-6962 PT Time Calculation (min) (ACUTE ONLY): 23 min   Charges:   PT Evaluation $PT Eval High Complexity: 1 High          Armanda Heritage, SPT   Armanda Heritage 10/03/2021, 2:06 PM

## 2021-10-04 DIAGNOSIS — I610 Nontraumatic intracerebral hemorrhage in hemisphere, subcortical: Secondary | ICD-10-CM | POA: Diagnosis not present

## 2021-10-04 LAB — BASIC METABOLIC PANEL
Anion gap: 10 (ref 5–15)
BUN: 44 mg/dL — ABNORMAL HIGH (ref 8–23)
CO2: 20 mmol/L — ABNORMAL LOW (ref 22–32)
Calcium: 8.7 mg/dL — ABNORMAL LOW (ref 8.9–10.3)
Chloride: 111 mmol/L (ref 98–111)
Creatinine, Ser: 1.39 mg/dL — ABNORMAL HIGH (ref 0.61–1.24)
GFR, Estimated: 55 mL/min — ABNORMAL LOW (ref >60)
Glucose, Bld: 139 mg/dL — ABNORMAL HIGH (ref 70–99)
Potassium: 4.1 mmol/L (ref 3.5–5.1)
Sodium: 141 mmol/L (ref 135–145)

## 2021-10-04 LAB — CBC
HCT: 49.2 % (ref 39.0–52.0)
Hemoglobin: 16.1 g/dL (ref 13.0–17.0)
MCH: 30.8 pg (ref 26.0–34.0)
MCHC: 32.7 g/dL (ref 30.0–36.0)
MCV: 94.3 fL (ref 80.0–100.0)
Platelets: 235 10*3/uL (ref 150–400)
RBC: 5.22 MIL/uL (ref 4.22–5.81)
RDW: 13.5 % (ref 11.5–15.5)
WBC: 10 10*3/uL (ref 4.0–10.5)
nRBC: 0 % (ref 0.0–0.2)

## 2021-10-04 LAB — GLUCOSE, CAPILLARY
Glucose-Capillary: 130 mg/dL — ABNORMAL HIGH (ref 70–99)
Glucose-Capillary: 122 mg/dL — ABNORMAL HIGH (ref 70–99)
Glucose-Capillary: 161 mg/dL — ABNORMAL HIGH (ref 70–99)
Glucose-Capillary: 177 mg/dL — ABNORMAL HIGH (ref 70–99)
Glucose-Capillary: 180 mg/dL — ABNORMAL HIGH (ref 70–99)
Glucose-Capillary: 133 mg/dL — ABNORMAL HIGH (ref 70–99)

## 2021-10-04 MED ORDER — SENNOSIDES-DOCUSATE SODIUM 8.6-50 MG PO TABS
1.0000 | ORAL_TABLET | Freq: Two times a day (BID) | ORAL | Status: DC
  Administered 2021-10-04 – 2021-10-10 (×10): 1 via ORAL
  Filled 2021-10-04 (×10): qty 1

## 2021-10-04 MED ORDER — POLYETHYLENE GLYCOL 3350 17 G PO PACK: 17.0000 g | PACK | Freq: Every day | ORAL | Status: DC | PRN

## 2021-10-04 MED ORDER — ISOSORBIDE MONONITRATE 20 MG PO TABS
10.0000 mg | ORAL_TABLET | Freq: Three times a day (TID) | ORAL | Status: DC
  Administered 2021-10-04 – 2021-10-10 (×21): 10 mg via ORAL
  Filled 2021-10-04 (×21): qty 1

## 2021-10-04 MED ORDER — GABAPENTIN 100 MG PO CAPS
300.0000 mg | ORAL_CAPSULE | Freq: Every day | ORAL | Status: DC
  Administered 2021-10-04 – 2021-10-10 (×7): 300 mg via ORAL
  Filled 2021-10-04 (×7): qty 3

## 2021-10-04 MED ORDER — ORAL CARE MOUTH RINSE
15.0000 mL | Freq: Two times a day (BID) | OROMUCOSAL | Status: DC
  Administered 2021-10-04 – 2021-10-05 (×4): 15 mL via OROMUCOSAL

## 2021-10-04 MED ORDER — QUETIAPINE FUMARATE 25 MG PO TABS
50.0000 mg | ORAL_TABLET | Freq: Every day | ORAL | Status: DC
Start: 2021-10-04 — End: 2021-10-05
  Administered 2021-10-04: 50 mg via ORAL
  Filled 2021-10-04: qty 2

## 2021-10-04 MED ORDER — SACUBITRIL-VALSARTAN 49-51 MG PO TABS
1.0000 | ORAL_TABLET | Freq: Two times a day (BID) | ORAL | Status: DC
  Administered 2021-10-04 – 2021-10-10 (×13): 1 via ORAL
  Filled 2021-10-04 (×15): qty 1

## 2021-10-04 MED ORDER — POLYETHYLENE GLYCOL 3350 17 G PO PACK
17.0000 g | PACK | Freq: Every day | ORAL | Status: DC | PRN
  Filled 2021-10-04: qty 1

## 2021-10-04 NOTE — [place] FL2 (Signed)
?[place] MEDICAID FL2 LEVEL OF CARE SCREENING TOOL  ?  ? ?IDENTIFICATION  ?Patient Name: ?Danny [name] Sr. Birthdate: [birth year] Sex: male Admission Date (Current Location): ?09/30/2021  ?[place] and [place] Number: ? [place] ?  Facility and Address:  ?[facility], [place] [address], [place], [place] [address] ?     Provider Number: ?[id number]  ?Attending Physician Name and Address:  ?Stroke, Md, MD ? Relative Name and Phone Number:  ?  ?   ?Current Level of Care: ?Hospital Recommended Level of Care: ?[place] Prior Approval Number: ?  ? ?Date Approved/Denied: ?  PASRR Number: ?[id number] ? ?Discharge Plan: ?SNF ?  ? ?Current Diagnoses: ?Patient Active Problem List  ? Diagnosis Date Noted  ? ICH (intracerebral hemorrhage) ([place]) 09/30/2021  ? SIRS (systemic inflammatory response syndrome) ([place]) 08/15/2021  ? Irregular heart beat 01/24/2021  ? Hx of BKA, left ([place]) 12/16/2020  ? Reactive thrombocytosis 11/14/2020  ? Chronic systolic heart failure ([place])   ? Cellulitis of left foot 11/09/2020  ? HFrEF (heart failure with reduced ejection fraction) ([place])   ? Thrush   ? Sepsis ([place]) 11/06/2020  ? Gangrene of left foot ([place])   ? PVD (peripheral vascular disease) ([place])   ? Stage 2 chronic kidney disease   ? Atherosclerosis of native arteries of the extremities with gangrene ([place]) 10/28/2020  ? Low back pain 06/29/2020  ? Onychomycosis 03/29/2020  ? Fall 03/29/2020  ? AKI (acute kidney injury) ([place]) 12/28/2019  ? Coronary artery disease of native artery of native heart with stable angina pectoris ([place]) 12/12/2018  ? Pure hypercholesterolemia 12/12/2018  ? Chest pain 10/31/2018  ? Atherosclerosis of native arteries of extremity with intermittent claudication ([place]) 02/11/2018  ? COPD with acute exacerbation ([place]) 11/26/2017  ? Decreased pedal pulses 07/02/2017  ? Orthostasis 05/07/2017  ? Anxiety and depression 05/07/2017  ? CKD (chronic kidney disease) stage 3, GFR 30-59 ml/min (HCC)  03/06/2017  ? Diabetic neuropathy ([place]) 03/06/2017  ? Chronic diastolic CHF (congestive heart failure) ([place]) 03/05/2017  ? Morbid obesity ([place]) 06/18/2016  ? H/O medication noncompliance 06/18/2016  ? COPD with chronic bronchitis ([place]) 06/20/2015  ? GERD (gastroesophageal reflux disease) 12/20/2014  ? Type 2 diabetes mellitus with hyperlipidemia ([place]) 06/05/2010  ? Hyperlipidemia 02/10/2010  ? Essential hypertension 02/10/2010  ? CAD (coronary artery disease) 02/10/2010  ? ? ?Orientation RESPIRATION BLADDER Height & Weight   ?  ?Self, Place ? O2 (Nasal cannula 3L) Incontinent, External catheter Weight: 217 lb 6 oz (98.6 kg) ?Height:  6' (182.9 cm)  ?BEHAVIORAL SYMPTOMS/MOOD NEUROLOGICAL BOWEL NUTRITION STATUS  ?    Incontinent Diet (See DC Summary)  ?AMBULATORY STATUS COMMUNICATION OF NEEDS Skin   ?Extensive Assist Verbally Normal ?  ?  ?  ?    ?     ?     ? ? ?Personal Care Assistance Level of Assistance  ?Bathing, Feeding, Dressing Bathing Assistance: Maximum assistance ?Feeding assistance: Maximum assistance ?Dressing Assistance: Maximum assistance ?   ? ?Functional Limitations Info  ?    ?  ?   ? ? ?SPECIAL CARE FACTORS FREQUENCY  ?PT (By licensed PT), OT (By licensed OT), Speech therapy   ?  ?PT Frequency: 5x/week ?OT Frequency: 5x/week ?  ?  ?Speech Therapy Frequency: 2x/week ?   ? ? ?Contractures Contractures Info: Not present  ? ? ?Additional Factors Info  ?Code Status, Allergies, Psychotropic, Insulin Sliding Scale Code Status Info: Full ?Allergies Info: NKA ?Psychotropic Info: Lexapro;  Seroquel ?Insulin Sliding Scale Info: See dc summary ?  ?   ? ?Current Medications (10/04/2021):  This is the current hospital active medication list ?Current Facility-Administered Medications  ?Medication Dose Route Frequency Provider Last Rate Last Admin  ? 0.9 %  sodium chloride infusion   Intravenous Continuous de Yolanda Manges, Cortney E, NP 75 mL/hr at 10/04/21 1100 Infusion Verify at 10/04/21 1100  ? acetaminophen (TYLENOL)  tablet 650 mg  650 mg Oral Q4H PRN Kerney Elbe, MD   650 mg at 10/04/21 0946  ? Or  ? acetaminophen (TYLENOL) 160 MG/5ML solution 650 mg  650 mg Per Tube Q4H PRN Kerney Elbe, MD      ? Or  ? acetaminophen (TYLENOL) suppository 650 mg  650 mg Rectal Q4H PRN Kerney [place]be, MD      ? carvedilol (COREG) tablet 12.5 mg  12.5 mg Oral BID WC Kerney Elbe, MD   12.5 mg at 10/04/21 0946  ? Chlorhexidine Gluconate Cloth 2 % PADS 6 each  6 each Topical Q0600 Kerney Elbe, MD   6 each at 10/04/21 [phone]  ? clevidipine (CLEVIPREX) infusion 0.5 mg/mL  0-21 mg/hr Intravenous Continuous [name], NP 16 mL/hr at 10/04/21 1100 8 mg/hr at 10/04/21 1100  ? enoxaparin (LOVENOX) injection 40 mg  40 mg Subcutaneous Q24H Dorene Grebe, MD      ? escitalopram (LEXAPRO) tablet 10 mg  10 mg Oral Daily [name], NP   10 mg at 10/04/21 0946  ? folic acid (FOLVITE) tablet 1 mg  1 mg Oral Daily Kerney Elbe, MD   1 mg at 10/04/21 [id number]  ? furosemide (LASIX) tablet 20 mg  20 mg Oral Wille Celeste, Marcelino Scot, NP   20 mg at 10/01/21 [id number]  ? gabapentin (NEURONTIN) capsule 300 mg  300 mg Oral QHS [name], NP      ? insulin aspart (novoLOG) injection 0-15 Units  0-15 Units Subcutaneous Q4H Kerney [place]be, MD   2 Units at 10/04/21 [phone]  ? isosorbide mononitrate (ISMO) tablet 10 mg  10 mg Oral TID [name], NP   10 mg at 10/04/21 [id number]  ? labetalol (NORMODYNE) injection 10 mg  10 mg Intravenous Q2H PRN de Yolanda Manges, Cortney E, NP      ? multivitamin with minerals tablet 1 tablet  1 tablet Oral Daily Kerney Elbe, MD   1 tablet at 10/04/21 0945  ? pantoprazole (PROTONIX) EC tablet 40 mg  40 mg Oral QHS Dorene Grebe, MD   40 mg at 10/02/21 2125  ? polyethylene glycol (MIRALAX / GLYCOLAX) packet 17 g  17 g Oral Daily PRN [name], NP      ? QUEtiapine (SEROQUEL) tablet 50 mg  50 mg Oral QHS [name], NP      ? sacubitril-valsartan (ENTRESTO) 49-51 mg per tablet  1 tablet Oral BID [name], NP   1 tablet at 10/04/21  0957  ? senna-docusate (Senokot-S) tablet 1 tablet  1 tablet Oral BID [name], NP   1 tablet at 10/04/21 0957  ? thiamine tablet 100 mg  100 mg Oral Daily Kerney Elbe, MD   100 mg at 10/04/21 [id number]  ? ? ? ?Discharge Medications: ?Please see discharge summary for a list of discharge medications. ? ?Relevant Imaging Results: ? ?Relevant Lab Results: ? ? ?Additional Information ?SS #: [id number]. Moderna COVID-19 Vaccine 05/09/2020 , 04/07/2020 ? ?Lissa Morales Eletha Culbertson, LCSW ? ? ? ? ?

## 2021-10-04 NOTE — Evaluation (Signed)
Occupational Therapy Evaluation Patient Details Name: Danny BERNARD Sr. MRN: [id number] DOB: 29-Aug-1950 Today's Date: 10/04/2021   History of Present Illness Danny WYSZYNSKI Sr. is a 71 y.o. male who presented to [facility] after being found down by family. CT: positive for R IVH.  Transferred to [facility]. MRI: Punctate acute infarct in the right frontal cortex and unchanged hemorrhagic volume and ventriculomegaly. PHMx: hypertension, COPD, DM, CAD CHF, PAD s/p L BKA 11/2020, and dementia.   Clinical Impression   Patient admitted for the diagnosis above.  Per the chart he lives with his son, who unfortunately is hospitalized as well.  Prior level of function and an out of bed assessment was difficult due to lethargy.  Currently he is needing up to Max A for basic mobility and ADL completion from a bedlevel.  OT will continue to follow in the acute setting to maximize his functional status, but SNF will be needed for post acute rehab prior to returning home.  Currently he does not have the adequate assist to transition directly home.       Recommendations for follow up therapy are one component of a multi-disciplinary discharge planning process, led by the attending physician.  Recommendations may be updated based on patient status, additional functional criteria and insurance authorization.   Follow Up Recommendations  Skilled nursing-short term rehab (<3 hours/day)    Assistance Recommended at Discharge Frequent or constant Supervision/Assistance  Patient can return home with the following Two people to help with walking and/or transfers;A lot of help with bathing/dressing/bathroom;Assistance with cooking/housework;Assistance with feeding;Help with stairs or ramp for entrance;Assist for transportation;Direct supervision/assist for financial management;Direct supervision/assist for medications management    Functional Status Assessment  Patient has had a recent decline in their  functional status and demonstrates the ability to make significant improvements in function in a reasonable and predictable amount of time.  Equipment Recommendations  BSC/3in1;Tub/shower bench    Recommendations for Other Services       Precautions / Restrictions Precautions Precautions: Fall Restrictions Weight Bearing Restrictions: No Other Position/Activity Restrictions: Prosthesis is at home      Mobility Bed Mobility Overal bed mobility: Needs Assistance Bed Mobility: Rolling Rolling: Max assist           Patient Response: Restless  Transfers                   General transfer comment: deferred, RN stated patient has been very restless today, but seemed to be finally ready to sleep      Balance                                           ADL either performed or assessed with clinical judgement   ADL Overall ADL's : Needs assistance/impaired     Grooming: Wash/dry hands;Wash/dry face;Minimal assistance;Bed level           Upper Body Dressing : Moderate assistance;Bed level   Lower Body Dressing: Maximal assistance;Bed level                       Vision   Vision Assessment?: Vision impaired- to be further tested in functional context Additional Comments: Patient was able to make good eye contact and track OT in room from side to side.  Patient denies any blurred or double vision.     Perception  Perception Perception: Not tested   Praxis Praxis Praxis: Not tested    Pertinent Vitals/Pain Pain Assessment Pain Assessment: Faces Faces Pain Scale: No hurt Pain Intervention(s): Monitored during session     Hand Dominance Right   Extremity/Trunk Assessment Upper Extremity Assessment Upper Extremity Assessment: LUE deficits/detail;Generalized weakness;RUE deficits/detail RUE Deficits / Details: Patient using R UE more during eval. RUE Sensation: decreased light touch RUE Coordination: WNL LUE Deficits /  Details: Patient not using L UE as much, but was able to move against gravity when prompted.  Unable to fully follow through for MMT commands. LUE Sensation: decreased light touch LUE Coordination: decreased fine motor;decreased gross motor   Lower Extremity Assessment Lower Extremity Assessment: Defer to PT evaluation   Cervical / Trunk Assessment Cervical / Trunk Assessment: Kyphotic   Communication     Cognition Arousal/Alertness: Lethargic   Overall Cognitive Status: History of cognitive impairments - at baseline                                 General Comments: Per PT eval:  Pt has dementia at baseline. He could respond to simple questions 25% of the time with slurred speech. Talked to daughter on the phone, she said that dementia was not a major inpairment. She described her father's dementia as forgetting conversations here and there, forgeting the day of the week and what appointments he had to go to.     General Comments       Exercises     Shoulder Instructions      Home Living Family/patient expects to be discharged to:: Private residence Living Arrangements: Children   Type of Home: House Home Access: Ramped entrance     Home Layout: One level     Bathroom Shower/Tub: Tub/shower unit;Other (comment)   Bathroom Toilet: Standard     Home Equipment: Grab bars - toilet;Wheelchair - Sport and exercise psychologist Comments: Taken from prior information as patient remains lethargic  Lives With: Son    Prior Functioning/Environment                 ADLs Comments: Per PT note:  Pt's son took care of most ADL's and house work, driving pt to appointments, and coordinating appointments. Pt was utilizing home health PT services to learn how to walk with is prostetic but was experiencing pain with weightbaring in his prostetic. He was able to walk household distances with physical assistance from the home health PT. Pt was able to be left home  alone throughout the day.        OT Problem List: Decreased strength;Decreased activity tolerance;Impaired balance (sitting and/or standing);Decreased safety awareness      OT Treatment/Interventions: Self-care/ADL training;Therapeutic exercise;Therapeutic activities;Cognitive remediation/compensation;Patient/family education;Balance training;DME and/or AE instruction    OT Goals(Current goals can be found in the care plan section) Acute Rehab OT Goals OT Goal Formulation: Patient unable to participate in goal setting Time For Goal Achievement: 10/18/21 Potential to Achieve Goals: Fair ADL Goals Pt Will Perform Grooming: with min assist;sitting Pt Will Perform Upper Body Bathing: with min assist;sitting Pt Will Perform Upper Body Dressing: with min assist;sitting Pt Will Transfer to Toilet: with min assist;stand pivot transfer;bedside commode Pt/caregiver will Perform Home Exercise Program: Increased strength;Both right and left upper extremity;With minimal assist  OT Frequency: Min 2X/week    Co-evaluation              AM-PAC OT "6  Clicks" Daily Activity     Outcome Measure Help from another person eating meals?: A Lot Help from another person taking care of personal grooming?: A Lot Help from another person toileting, which includes using toliet, bedpan, or urinal?: A Lot Help from another person bathing (including washing, rinsing, drying)?: A Lot Help from another person to put on and taking off regular upper body clothing?: A Lot Help from another person to put on and taking off regular lower body clothing?: A Lot 6 Click Score: 12   End of Session Nurse Communication: Mobility status  Activity Tolerance: Patient limited by lethargy Patient left: in bed;with call bell/phone within reach;with bed alarm set  OT Visit Diagnosis: Muscle weakness (generalized) (M62.81);Other symptoms and signs involving cognitive function                Time: 9753-0051 OT Time  Calculation (min): 16 min Charges:  OT General Charges $OT Visit: 1 Visit OT Evaluation $OT Eval Moderate Complexity: 1 Mod  10/04/2021  RP, OTR/L  Acute Rehabilitation Services  Office:  [phone]   Suzanna Obey 10/04/2021, 1:42 PM

## 2021-10-04 NOTE — Evaluation (Signed)
Clinical/Bedside Swallow Evaluation ?Patient Details  ?Name: Danny Horney Stefan Sr. ?MRN: [id number] ?Date of Birth: 03-Jun-1951 ? ?Today's Date: 10/04/2021 ?Time: SLP Start Time (ACUTE ONLY): 0910 SLP Stop Time (ACUTE ONLY): 0930 ?SLP Time Calculation (min) (ACUTE ONLY): 20 min ? ?Past Medical History:  ?Past Medical History:  ?Diagnosis Date  ? CAD (coronary artery disease)   ? a. 01/2010 PCI of LAD; b. 09/2014 PCI/DES of mLAD due to ISR. D1 80, D2 80(jailed), LCX 42m; c. 07/2016 NSTEMI/Cath: LM nl, LAD 31m ISR, 40d, D1 90ost, 80p, D2 90ost, RI min irregs, LCX min irregs, OM1 90 small, OM2/3 min irregs, RCA min irregs, RPLB1 90, EF 25-35%.  ? Chronic combined systolic and diastolic CHF (congestive heart failure) ([place])   ? a. 07/2016 Echo: EF 30%, severe septal/anterior HK, Gr1 DD.  ? CKD (chronic kidney disease), stage III ([place])   ? COPD (chronic obstructive pulmonary disease) ([place])   ? DM2 (diabetes mellitus, type 2) ([place])   ? Erectile dysfunction   ? HLD (hyperlipidemia)   ? HTN (hypertension)   ? Ischemic cardiomyopathy   ? a. 07/2016 Echo: EF 30% w/ sev septal/ant HK. Gr1 DD.  ? ?Past Surgical History:  ?Past Surgical History:  ?Procedure Laterality Date  ? AMPUTATION Left 11/10/2020  ? Procedure: AMPUTATION BELOW KNEE;  Surgeon: [name], MD;  Location: [facility] ORS;  Service: General;  Laterality: Left;  ? BELOW KNEE LEG AMPUTATION    ? CAD: stent to the LAD    ? CARDIAC CATHETERIZATION  10/01/2014  ? CARDIAC CATHETERIZATION N/A 07/23/2016  ? Procedure: Left Heart Cath and Coronary Angiography;  Surgeon: [name], MD;  Location: [facility] [facility];  Service: Cardiovascular;  Laterality: N/A;  ? CORONARY ANGIOPLASTY WITH STENT PLACEMENT  10/01/2014  ? LOWER EXTREMITY ANGIOGRAPHY Left 10/31/2020  ? Procedure: LOWER EXTREMITY ANGIOGRAPHY;  Surgeon: [name], MD;  Location: [facility];  Service: Cardiovascular;  Laterality: Left;  ? ?HPI:  ?Patient is a 71 y.o. male with PMH: HTN, HLD, dementia, COPD,  HLD, CAD, DM-2. Typically he has supervision and assistance from his son, but unfortunately his son was admitted and continues to be admitted at hospital after suffering intracranial hemorrhage. Patient admitted to hospital after a family member who was checking on him found him down on the ground and more confused than usual. Family member brought him to [facility] for evaluation and CT head revealed [place] with intraventricular extension. Patient was then transferred to [facility].  ?  ?Assessment / Plan / Recommendation  ?Clinical Impression ? The patient was seen at bedside for clinical swallow evaluation. NG tube in place during BSE. Patient reports that at baseline he cuts up his food into small pieces. Oral motor exam revealed adequate labial/lingual/buccal strength and ROM. The patient required mod assist in order to engage in self-feeding behaviors. Patient verbalized wanting to eat/drink independently across PO trials, however he required increased verbal cues in order to adequately lift arms up all the way to his mouth to receive novel boluses. The patient tolerated consecutive sips of thin liquids, puree and regular textures with no overt s/sx of aspiration. Vocal quality clear, and patient did not report any globus sensation. At the end of the session, patient displayed delayed cough. Given current NG tube placement, SLP discussed with RN about removing NG tube and placing patient on Dys 3/thin liquid diet with full supervision/monitoring. SLP to follow for diet toleration and need for instrumental testing. ?SLP Visit Diagnosis: Dysphagia,  unspecified (R13.10) ?   ?Aspiration Risk ? Moderate aspiration risk  ?  ?Diet Recommendation Dysphagia 3 (Mech soft);Thin liquid  ? ?Liquid Administration via: Straw ?Medication Administration: Whole meds with liquid ?Supervision: Staff to assist with self feeding;Full supervision/cueing for compensatory strategies ?Compensations: Slow rate;Small sips/bites ?Postural Changes: Seated  upright at 90 degrees  ?  ?Other  Recommendations Oral Care Recommendations: Oral care BID   ? ?Recommendations for follow up therapy are one component of a multi-disciplinary discharge planning process, led by the attending physician.  Recommendations may be updated based on patient status, additional functional criteria and insurance authorization. ? ?Follow up Recommendations Skilled nursing-short term rehab (<3 hours/day)  ? ? ?  ?Assistance Recommended at Discharge Frequent or constant Supervision/Assistance  ?Functional Status Assessment Patient has had a recent decline in their functional status and demonstrates the ability to make significant improvements in function in a reasonable and predictable amount of time.  ?Frequency and Duration min 1 x/week  ?2 weeks ?  ?   ? ?Prognosis Prognosis for Safe Diet Advancement: Good ?Barriers to Reach Goals: Cognitive deficits  ? ?  ? ?Swallow Study   ?General HPI: Patient is a 71 y.o. male with PMH: HTN, HLD, dementia, COPD, HLD, CAD, DM-2. Typically he has supervision and assistance from his son, but unfortunately his son was admitted and continues to be admitted at hospital after suffering intracranial hemorrhage. Patient admitted to hospital after a family member who was checking on him found him down on the ground and more confused than usual. Family member brought him to [facility] for evaluation and CT head revealed [place] with intraventricular extension. Patient was then transferred to [facility]. ?Diet Prior to this Study: Dysphagia 3 (soft);Thin liquids ?Temperature Spikes Noted: No ?Respiratory Status: Room air ?History of Recent Intubation: No ?Behavior/Cognition: Alert;Cooperative;Pleasant mood;Requires cueing ?Oral Cavity Assessment: Dry ?Oral Care Completed by SLP: No ?Oral Cavity - Dentition: Edentulous ?Vision: Functional for self-feeding ?Self-Feeding Abilities: Needs assist ?Patient Positioning: Upright in bed ?Baseline Vocal Quality: Normal  ?  ?Oral/Motor/Sensory  Function Overall Oral Motor/Sensory Function: Within functional limits   ?Ice Chips Ice chips: Not tested   ?Thin Liquid Thin Liquid: Within functional limits ?Presentation: Straw ?Other Comments: Delayed cough at end of session  ?  ?Nectar Thick Nectar Thick Liquid: Not tested   ?Honey Thick Honey Thick Liquid: Not tested   ?Puree Puree: Within functional limits ?Presentation: Spoon   ?Solid ? ? ?  Solid: Within functional limits ?Presentation: Self Fed  ? ?  ? ?Vaughan Sine ?10/04/2021,9:49 AM ? ? ? ?

## 2021-10-04 NOTE — Progress Notes (Signed)
Physical Therapy Treatment ?Patient Details ?Name: Danny Olivero Onofrio Sr. ?MRN: [id number] ?DOB: 23-Jun-1951 ?Today's Date: 10/04/2021 ? ? ?History of Present Illness Danny BELSHE Sr. is a 71 y.o. male who presented to [facility] after being found down by family. CT: positive for R IVH.  Transferred to [facility]. MRI: Punctate acute infarct in the right frontal cortex and unchanged hemorrhagic volume and ventriculomegaly. PHMx: hypertension, COPD, DM, CAD CHF, PAD s/p L BKA 11/2020, anddementia. ? ?  ?PT Comments  ? ? Pt required a lot of assistance to sit on EOB. Several attempts were made to stand with +2 assistance and pt was unable to stand. Pt is able to assist with bed mobility with cuing. During treatment session patient showed deficits in strength, endurance, activity tolerance. Recommending therapy services at a skilled nursing facility. to address the previously stated deficits. Will continue to follow acutely to maximize functional mobility, independence and safety. ?   ?Recommendations for follow up therapy are one component of a multi-disciplinary discharge planning process, led by the attending physician.  Recommendations may be updated based on patient status, additional functional criteria and insurance authorization. ? ?Follow Up Recommendations ? Skilled nursing-short term rehab (<3 hours/day) ?  ?  ?Assistance Recommended at Discharge Frequent or constant Supervision/Assistance  ?Patient can return home with the following Two people to help with walking and/or transfers;Two people to help with bathing/dressing/bathroom;Direct supervision/assist for medications management;Help with stairs or ramp for entrance;Assist for transportation;Assistance with feeding;Assistance with cooking/housework;Direct supervision/assist for financial management ?  ?Equipment Recommendations ? Other (comment) (TBD as pt progresses)  ?  ?Recommendations for Other Services   ? ? ?  ?Precautions / Restrictions  Precautions ?Precautions: Fall ?Restrictions ?Weight Bearing Restrictions: No ?Other Position/Activity Restrictions: Prosthesis is at home  ?  ? ?Mobility ? Bed Mobility ?Overal bed mobility: Needs Assistance ?Bed Mobility: Rolling, Sidelying to Sit, Sit to Sidelying ?Rolling: Max assist ?Sidelying to sit: Total assist ?  ?  ?Sit to sidelying: Max assist ?General bed mobility comments: Pt required simple step by step cues for bed mobility. ?  ? ?Transfers ?Overall transfer level: Needs assistance ?  ?  ?  ?  ?  ?  ?  ?  ?General transfer comment: 5+ attempts as sit to stand were made and pt was unable to stand. He was able to lift off of the bed with max assist. ?  ? ?Ambulation/Gait ?  ?  ?  ?  ?  ?  ?  ?  ? ? ?Stairs ?  ?  ?  ?  ?  ? ? ?Wheelchair Mobility ?  ? ?Modified Rankin (Stroke Patients Only) ?Modified Rankin (Stroke Patients Only) ?Pre-Morbid Rankin Score: Moderately severe disability ?Modified Rankin: Severe disability ? ? ?  ?Balance Overall balance assessment: Needs assistance ?Sitting-balance support: Bilateral upper extremity supported, Feet supported ?Sitting balance-[name]: Fair ?Sitting balance - Comments: Pt required max assist upon sitting up initially but was able to sit with supervision after getting used to sitting balance. ?Postural control: Left lateral lean, Posterior lean ?  ?  ?Standing balance comment: Standing was attempted and pt was unable. ?  ?  ?  ?  ?  ?  ?  ?  ?  ?  ?  ?  ? ?  ?Cognition Arousal/Alertness: Lethargic ?Behavior During Therapy: Flat affect ?Overall Cognitive Status: History of cognitive impairments - at baseline ?  ?  ?  ?  ?  ?  ?  ?  ?  ?  ?  ?  ?  ?  ?  ?  ?  General Comments: Pt had slurred speech and was difficult to understand at times but was able to respond to questions. Could respond to name and DOB with increased time. He did no know he was in a hospital and though he was in [place]. Pt is not aware of his deficits, he kept saying the he wanted to go to  the bathroom to use the toilet after several failed attempts at standing. ?  ?  ? ?  ?Exercises   ? ?  ?General Comments General comments (skin integrity, edema, etc.): Pt had a bowel movement in the bed and pt assisted with hygine. ?  ?  ? ?Pertinent Vitals/Pain Pain Assessment ?Pain Assessment: No/denies pain  ? ? ?Home Living   ?  ?  ?  ?  ?  ?  ?  ?  ?  ?   ?  ?Prior Function    ?  ?  ?   ? ?PT Goals (current goals can now be found in the care plan section)   ? ?  ?Frequency ? ? ? Min 3X/week ? ? ? ?  ?PT Plan    ? ? ?Co-evaluation   ?  ?  ?  ?  ? ?  ?AM-PAC PT "6 Clicks" Mobility   ?Outcome Measure ? Help needed turning from your back to your side while in a flat bed without using bedrails?: Total ?Help needed moving from lying on your back to sitting on the side of a flat bed without using bedrails?: Total ?Help needed moving to and from a bed to a chair (including a wheelchair)?: Total ?Help needed standing up from a chair using your arms (e.g., wheelchair or bedside chair)?: Total ?Help needed to walk in hospital room?: Total ?Help needed climbing 3-5 steps with a railing? : Total ?6 Click Score: 6 ? ?  ?End of Session Equipment Utilized During Treatment: Gait belt ?Activity Tolerance: Patient tolerated treatment well;Patient limited by fatigue ?Patient left: in bed;with call bell/phone within reach;with bed alarm set ?Nurse Communication: Mobility status (need for sacral wound patch) ?PT Visit Diagnosis: Other symptoms and signs involving the nervous system (R29.898);Muscle weakness (generalized) (M62.81);History of falling (Z91.81) ?  ? ? ?Time: 2831-5176 ?PT Time Calculation (min) (ACUTE ONLY): 34 min ? ?Charges:  $Therapeutic Activity: 23-37 mins          ?          ? ?Armanda Heritage, SPT ? ? ?Armanda Heritage ?10/04/2021, 5:40 PM ? ?

## 2021-10-04 NOTE — Progress Notes (Addendum)
STROKE TEAM PROGRESS NOTE   INTERVAL HISTORY Patient is seen in his room with no family at the bedside.  He continues to be drowsy after receiving lorazepam again overnight.  He has been hemodynamically stable and his neurological exam is stable.  Restless overnight, speech to see today.  Stable to move out of ICU once blood pressure is under control.   Vitals:   03/01/Danny 0715 03/01/Danny 0730 03/01/Danny 0745 03/01/Danny 0800  BP:   (!) 148/87 139/80  Pulse: (!) 106 (!) 103 (!) 106 (!) 107  Resp:      Temp:    98.5 F (36.9 C)  TempSrc:    Oral  SpO2: 92% 93% 92% 92%  Weight:      Height:       CBC:  Recent Labs  Lab 02/28/Danny 0230 03/01/Danny 0758  WBC 12.0* 10.0  HGB 16.5 16.1  HCT 49.5 49.2  MCV 92.7 94.3  PLT 258 [id number]    Basic Metabolic Panel:  Recent Labs  Lab 02/25/Danny 1055 02/28/Danny 0230  NA 136 138  K 4.1 3.8  CL 102 107  CO2 Danny 19*  GLUCOSE 215* 108*  BUN Danny 39*  CREATININE 0.94 1.31*  CALCIUM 9.3 8.8*    Lipid Panel:  Recent Labs  Lab 02/28/Danny 1018  CHOL 112  TRIG 147  HDL 44  CHOLHDL 2.5  VLDL 29  LDLCALC 39    HgbA1c:  Recent Labs  Lab 02/28/Danny 1018  HGBA1C 7.3*    Urine Drug Screen: No results for input(s): LABOPIA, COCAINSCRNUR, LABBENZ, AMPHETMU, THCU, LABBARB in the last 168 hours.  Alcohol Level No results for input(s): ETH in the last 168 hours.  IMAGING past 24 hours DG Abd 1 View  Result Date: 10/03/2021 CLINICAL DATA:  NG tube placement EXAM: ABDOMEN - 1 VIEW COMPARISON:  10/01/2021 FINDINGS: Bowel gas pattern is nonspecific. No abnormal masses or calcifications are seen. Kidneys are partly obscured by bowel contents. Pelvis is not included in the images. Tip of enteric tube is seen in the distal thoracic esophagus. There are linear densities in the mid and lower lung fields. Emphysematous changes are noted in the apical regions of both lungs. Costophrenic angles are clear. IMPRESSION: Tip of enteric tube is seen in the lower thoracic  esophagus and should be advanced 10-15 cm to place the tip and side port within the stomach. Bowel gas pattern is nonspecific. There are increased markings in both mid and both lower lung fields suggesting atelectasis/pneumonia. Part of this finding may suggest underlying scarring. Electronically Signed   By: Elmer Picker M.D.   On: 10/03/2021 10:59   DG Abd Portable 1V  Result Date: 10/03/2021 CLINICAL DATA:  OG tube placement EXAM: PORTABLE ABDOMEN - 1 VIEW COMPARISON:  10/03/2021 FINDINGS: OG tube tip is in the proximal stomach. The side port is in the distal esophagus. IMPRESSION: OG tube tip in the proximal stomach. Electronically Signed   By: Rolm Baptise M.D.   On: 10/03/2021 22:27    PHYSICAL EXAM  Physical Exam  Constitutional: Appears well-developed and well-nourished.  MSK: no joint deformities.bka left leg Cardiovascular: Normal rate and regular rhythm.  Respiratory: Effort normal, non-labored breathing Skin: WDI  Neuro: Mental Status: Drowsy but easily arousable.  Oriented to person place and year.  Follows all command No signs of aphasia or neglect Cranial Nerves: II: PERRL III,IV, VI: EOMI without ptosis or diploplia.  V: Facial sensation is symmetric to light touch VII: Facial movement is symmetric resting  and smiling VIII: Hearing is intact to voice   Motor: Tone is normal. Bulk is normal. Moves all extremities purposefully Sensory: Withdraws to pain in all extremities, no gross sensation deficit   ASSESSMENT/PLAN Danny Danny [name] Sr. is a 71 y.o. Danny [name] with history of HTN, HLD, COPD, and CHF presenting after being found down on the ground more confused than normal. He lives with his son who was also admitted to the hospital, so the patient has not been seen by anyone for the last couple of days.  Repeat head CT today showed unchanged parenchymal and intraventricular hemorrhage. Lateral ventriculomegaly is mildly improved. MRI ordered for tomorrow morning.  Hold ASA and plavix. CTA head  reveals occluded left V4 segment, likely chronic and stenosis of right vertebral artery with severe stenosis at V1 and V2 segments.Will discontinue lorazepam and add Seroquel for nighttime agitation.  Speech cleared for diet today.  Resume blood pressure meds p.o. and titrate down Cleviprex as able.  Stroke:  Acute IPH with IVH of the right lateral ventricle likely secondary hypertensive source Code Stroke Head CT- 12 mm focus of acute hemorrhage within the periventricular white matter adjacent to the body of the right lateral ventricle. Intraventricular hemorrhage appears to reflect extension of this hematoma. There is resulting mild communicating hydrocephalus. Repeat head CT- Unchanged parenchymal and intraventricular hemorrhage. Lateral ventriculomegaly is mildly improved. CTA head and neck occluded left V4 segment, likely chronic and stenosis of right vertebral artery with severe stenosis at V1 and V2 segments. MRI  absent flow void in right vertebral and proximal basilar artery with distal reconstitution, fetal PCAs, punctate acute infarct in right frontal cortex, unchanged ICH 2D Echo no atrial level shunt, EF not reported LDL 38 HgbA1c 7.4 VTE prophylaxis - SCDs, started lovenox today. aspirin 81 mg daily and clopidogrel 75 mg daily prior to admission, now on No antithrombotic. IPH with IVH Therapy recommendations:  SNF Disposition:  pending  Hypertension Home meds:  lasix, aldactone, coreg Stable BP relax goal to 160 as imaging is stable  Hyperlipidemia Home meds:  Atorvastatin 80mg  LDL 38, goal < 70  Resume when medically appropriate  Diabetes type II Controlled Home meds:  metformin, jardiance, trulicity 123456 7.4, goal < 7.0 CBGs SSI  Other Stroke Risk Factors Advanced Age >/= 97  Cigarette smoker, advised to stop smoking ETOH use, alcohol level No results found for requested labs within last 26280 hours., advised to drink no more than 2  drink(s) a day Coronary artery disease Previously on ASA and plavix Congestive heart failure with reduced ejection fraction Home meds: Entresto, lasix, aldactone, jardiance, imdur- resumed  Other Active Problems BPH- flomax resumed Centrilobular emphysema CKD stage 3 Atherosclerosis or native arteries with intermittent claudication Left BKA on 11/10/2020 with vascular surgery  Hospital day # 4  Patient seen and examined by NP/APP with MD. MD to update note as needed.   [name], DNP, FNP-BC [facility] Pager: [phone]    ATTENDING ATTESTATION:   Dr. Reeves Forth evaluated pt independently, reviewed imaging, chart, labs. Discussed and formulated plan with the APP. Please see APP note above for details.     71 year old gentleman with a past medical history of hyperlipidemia hypertension right corona radiata intracranial hemorrhage with intraventricular extension etiology is likely hypertensive.   chronic right vertebral artery severe stenosis at V1 V2 occlusion of the left V4 segment after PICA origin.  It appears to be chronic and unrelated to his intracranial hemorrhage. Angiogram when he is more stable  per Dr. Estanislado Pandy.  He is much more alert today after stopping Ativan.  Seroquel at night helping with agitation.  He wants to eat today.  Speech evaluate the patient and placed on modified diet.  Disposition is likely skilled nursing.  Out of ICU once off Cleviprex.  P.o. meds started     This patient is critically ill due to respiratory distress, agitation from ETOH abuse and ICH  and at significant risk of neurological worsening, death form heart failure, respiratory failure, recurrent stroke, bleeding from [facility], seizure, sepsis. This patient's care requires constant monitoring of vital signs, hemodynamics, respiratory and cardiac monitoring, review of multiple databases, neurological assessment, discussion with family, other specialists and medical decision making of high  complexity. I spent 35 minutes of neurocritical care time in the care of this patient.    Amoy Steeves,MD  To contact Stroke Continuity provider, please refer to [email or web address] After hours, contact General Neurology

## 2021-10-04 NOTE — TOC Initial Note (Signed)
Transition of Care (TOC) - Initial/Assessment Note    Patient Details  Name: Danny PERREAULT Sr. MRN: [id number] Date of Birth: 15-Jun-1951  Transition of Care [facility]) CM/SW Contact:    Benard Halsted, LCSW Phone Number: 10/04/2021, 11:42 AM  Clinical Narrative:                 CSW received consult for possible SNF placement at time of discharge. CSW spoke with patient's daughter. She reported that patient's son (and caregiver) is currently in the hospital as well and not doing well. She expressed understanding of PT recommendation and is agreeable to SNF placement at time of discharge, though she knows patient never wanted to go to a facility. She reported preference for [facility] as she has a family member there for rehab. She does not want [facility]. CSW discussed insurance authorization process and will provide Medicare SNF ratings list. Patient has received COVID vaccines. CSW will send out referrals for review. CSW answered questions about obtaining POA for patient and his son; daughter stated she does not have access to their finances and she will have to contact their landlord regarding the rent.   Skilled Nursing Rehab Facilities-   [email or web address]   Ratings out of 5 possible   Name Address  Phone # [place] Inspection Overall  [facility] [address] [place] 5 5 2 4   [facility]  [address], [place] [phone] 4 2 5 5   [facility] [place], [place] 4 1 1 1   [facility] [place], [place] 2 2 4 4   [facility] [address], [place] 2 1 2 1   [place], [place] [phone] 3 1 4 3   [facility] [address] [place] 5 2 2 3   [facility] [address] [place] 4 1 2 1   [address] ([place]) [place],  [place] [phone] 5 1 2 2   [facility] [address], [place] [phone] 4 1 1 1   [facility] [address] [facility] [phone] 4 1 2 1   [facility] ([place]) [place]. [place], [place] [phone] 4 1 1 1           [place] [address] [place] 4 2 3 3   [facility] [place] [address] [place] 3 1 5 4   [facility], [place], [address] [phone] 2 1 1 1   [facility] [address] [phone] 2 2 3 3           [address] (no [facility]) [address], [place] [phone] 4 5 5 5   [facility] (No [employer]) [phone] [address], [place] 4 1 4 3   [facility] (No [facility]) [place], [place] [address] 5 5 5 5   [facility] [address] [place] [phone] 3 3 4 4   [place] [address], [place]  [phone] 2 2 2 2   [phone] [place] 3 1 3 2   [place] [address], [place] 1 1 2 1   [facility] [address], [address] [place] 2 1 1 1   [place], [place] 5 2 4 5   [facility] [address], [place] 2  1 1 1   [facility] [address], [place] 3 1 1 1   [facility] [place] 2 1 2 1           [facility] [address], [place] [phone] 5 3 3 4   [place] [address] [place] 2 1 1 1   [place] (No Humana) [address], [place] [phone] 2 1 2 1   [facility] [address], [place] [phone] 3 1 1 1           [facility] [place], [place] 4 1 5 4   [facility]) [place], [place] 2  1 3 2   [facility]) [place] [address], [place] 3 1 4 3   [facility] [address] [place] 4 1 4 3   [address] [place], [place] 3 3 1 1   [facility]) [address] [place] [phone] 3 2 3 3      Expected Discharge Plan: [place] Barriers to Discharge: Continued Medical Work up, Ship broker, SNF Pending bed offer   Patient Goals and CMS Choice Patient states their goals for this hospitalization and ongoing recovery are:: Rehab CMS Medicare.gov Compare Post Acute Care list provided to:: Patient Represenative (must comment) Choice offered to / list presented to : Adult Children  Expected Discharge Plan and Services Expected Discharge Plan: [place] In-house Referral: Clinical Social Work   Post Acute Care Choice: [place] Living arrangements for the past 2 months: [place]                                      Prior Living Arrangements/Services Living arrangements for the past 2 months: Single Family Home Lives with:: Adult Children Patient language and need for interpreter reviewed:: Yes Do you feel safe going back to the place where you live?: Yes      Need for Family Participation in Patient Care: Yes (Comment) Care giver support system in place?: Yes (comment) Current home services: DME Criminal Activity/Legal Involvement Pertinent to Current Situation/Hospitalization: No - Comment as needed  Activities of Daily Living      Permission Sought/Granted Permission sought to share information with : Facility Sport and exercise psychologist, Family Supports Permission granted to share information with : Yes, Verbal Permission Granted  Share Information with NAME: [name]  Permission granted to share info w AGENCY: SNFs  Permission granted to share info w Relationship: Daughter  Permission granted  to share info w Contact Information: [phone]  Emotional Assessment Appearance:: Appears stated age Attitude/Demeanor/Rapport: Unable to Assess Affect (typically observed): Unable to Assess Orientation: : Oriented to Self, Oriented to Place Alcohol / Substance Use: Not Applicable Psych Involvement: No (comment)  Admission diagnosis:  ICH (intracerebral hemorrhage) ([place]) [I61.9] Patient Active Problem List   Diagnosis Date Noted   ICH (intracerebral hemorrhage) ([place]) 09/30/2021   SIRS (systemic inflammatory response syndrome) ([place]) 08/15/2021   Irregular heart beat 01/24/2021   Hx of BKA, left ([place]) 12/16/2020   Reactive thrombocytosis 123456   Chronic systolic heart failure ([place])    Cellulitis of left foot 11/09/2020   HFrEF (heart failure with reduced ejection fraction) ([place])    Thrush    Sepsis ([place]) 11/06/2020   Gangrene of left  foot ([place])    PVD (peripheral vascular disease) ([place])    Stage 2 chronic kidney disease    Atherosclerosis of native arteries of the extremities with gangrene ([place]) 10/28/2020   Low back pain 06/29/2020   Onychomycosis 03/29/2020   Fall 03/29/2020   AKI (acute kidney injury) ([place]) 12/28/2019   Coronary artery disease of native artery of native heart with stable angina pectoris ([place]) 12/12/2018   Pure hypercholesterolemia 12/12/2018   Chest pain 10/31/2018   Atherosclerosis of native arteries of extremity with intermittent claudication ([place]) 02/11/2018   COPD with acute exacerbation ([place]) 11/26/2017   Decreased pedal pulses 07/02/2017   Orthostasis 05/07/2017   Anxiety and depression 05/07/2017   CKD (chronic kidney disease) stage 3, GFR 30-59 ml/min (HCC) 03/06/2017   Diabetic neuropathy (HCC) 03/06/2017   Chronic diastolic CHF (congestive heart failure) ([place]) 03/05/2017   Morbid obesity ([place]) 06/18/2016   H/O medication noncompliance 06/18/2016   COPD with chronic bronchitis ([place]) 06/20/2015   GERD (gastroesophageal reflux disease)  12/20/2014   Type 2 diabetes mellitus with hyperlipidemia ([place]) 06/05/2010   Hyperlipidemia 02/10/2010   Essential hypertension 02/10/2010   CAD (coronary artery disease) 02/10/2010   PCP:  [name], MD Pharmacy:   [place], [place] [address] Phone: [phone] Fax: [phone]  [id number] [place], [place] - [address] [place] [address] Phone: [phone] Fax: [phone]     Social Determinants of Health (SDOH) Interventions    Readmission Risk Interventions Readmission Risk Prevention Plan 07/30/2019  Transportation Screening Complete  PCP or Specialist Appt within 5-7 Days Complete  Medication Review (RN CM) Complete  Some recent data might be hidden

## 2021-10-05 DIAGNOSIS — I610 Nontraumatic intracerebral hemorrhage in hemisphere, subcortical: Secondary | ICD-10-CM | POA: Diagnosis not present

## 2021-10-05 LAB — GLUCOSE, CAPILLARY
Glucose-Capillary: 129 mg/dL — ABNORMAL HIGH (ref 70–99)
Glucose-Capillary: 125 mg/dL — ABNORMAL HIGH (ref 70–99)
Glucose-Capillary: 127 mg/dL — ABNORMAL HIGH (ref 70–99)
Glucose-Capillary: 120 mg/dL — ABNORMAL HIGH (ref 70–99)
Glucose-Capillary: 138 mg/dL — ABNORMAL HIGH (ref 70–99)
Glucose-Capillary: 131 mg/dL — ABNORMAL HIGH (ref 70–99)

## 2021-10-05 LAB — BASIC METABOLIC PANEL
Anion gap: 7 (ref 5–15)
BUN: 29 mg/dL — ABNORMAL HIGH (ref 8–23)
CO2: 22 mmol/L (ref 22–32)
Calcium: 8.6 mg/dL — ABNORMAL LOW (ref 8.9–10.3)
Chloride: 109 mmol/L (ref 98–111)
Creatinine, Ser: 1.08 mg/dL (ref 0.61–1.24)
GFR, Estimated: >60 mL/min (ref >60)
Glucose, Bld: 119 mg/dL — ABNORMAL HIGH (ref 70–99)
Potassium: 4 mmol/L (ref 3.5–5.1)
Sodium: 138 mmol/L (ref 135–145)

## 2021-10-05 LAB — CBC
HCT: 46 % (ref 39.0–52.0)
Hemoglobin: 15 g/dL (ref 13.0–17.0)
MCH: 30.8 pg (ref 26.0–34.0)
MCHC: 32.6 g/dL (ref 30.0–36.0)
MCV: 94.5 fL (ref 80.0–100.0)
Platelets: 211 10*3/uL (ref 150–400)
RBC: 4.87 MIL/uL (ref 4.22–5.81)
RDW: 13.5 % (ref 11.5–15.5)
WBC: 9.5 10*3/uL (ref 4.0–10.5)
nRBC: 0 % (ref 0.0–0.2)

## 2021-10-05 MED ORDER — QUETIAPINE FUMARATE 25 MG PO TABS: 25.0000 mg | ORAL_TABLET | Freq: Every evening | ORAL | Status: DC | PRN

## 2021-10-05 NOTE — Progress Notes (Addendum)
STROKE TEAM PROGRESS NOTE  ? ?INTERVAL HISTORY ?Patient is seen in his room with no family at the bedside.  Lethargic this morning, Seroquel decreased and ordered PRN.  Stable to move out of ICU once blood pressure is under control. Hypotensive this morning, coreg and lasix held for today.  ? ?Vitals:  ? 10/05/21 1000 10/05/21 1100 10/05/21 1200 10/05/21 1300  ?BP: 118/74 135/74 133/80 129/83  ?Pulse: 84 82 88 85  ?Resp: 16 15 15 14   ?Temp:   98.5 ?F (36.9 ?C)   ?TempSrc:   Oral   ?SpO2: 95% 95% 95% 95%  ?Weight:      ?Height:      ? ?CBC:  ?Recent Labs  ?Lab 10/04/21 ?0758 10/05/21 ?[id number]  ?WBC 10.0 9.5  ?HGB 16.1 15.0  ?HCT 49.2 46.0  ?MCV 94.3 94.5  ?PLT 235 211  ? ? ?Basic Metabolic Panel:  ?Recent Labs  ?Lab 10/04/21 ?0758 10/05/21 ?[id number]  ?NA 141 138  ?K 4.1 4.0  ?CL 111 109  ?CO2 20* 22  ?GLUCOSE 139* 119*  ?BUN 44* 29*  ?CREATININE 1.39* 1.08  ?CALCIUM 8.7* 8.6*  ? ? ?Lipid Panel:  ?Recent Labs  ?Lab 10/03/21 ?1018  ?CHOL 112  ?TRIG 147  ?HDL 44  ?CHOLHDL 2.5  ?VLDL 29  ?[place] 39  ? ? ?HgbA1c:  ?Recent Labs  ?Lab 10/03/21 ?1018  ?HGBA1C 7.3*  ? ? ?Urine Drug Screen: No results for input(s): LABOPIA, COCAINSCRNUR, LABBENZ, AMPHETMU, THCU, LABBARB in the last 168 hours.  ?Alcohol Level No results for input(s): ETH in the last 168 hours. ? ?IMAGING past 24 hours ?No results found. ? ?PHYSICAL EXAM ? ?Physical Exam  ?Constitutional: Appears well-developed and well-nourished.  ?MSK: no joint deformities.bka left leg ?Cardiovascular: Normal rate and regular rhythm.  ?Respiratory: Effort normal, non-labored breathing ?Skin: WDI ? ?Neuro: ?Mental Status: ?Drowsy but easily arousable.  Oriented to person place and year.  Follows all command ?No signs of aphasia or neglect ?Cranial Nerves: ?II: PERRL ?III,IV, VI: EOMI without ptosis or diploplia.  ?V: Facial sensation is symmetric to light touch ?VII: Facial movement is symmetric resting and smiling ?VIII: Hearing is intact to voice ?  ?Motor: ?Tone is normal. Bulk is  normal. Moves all extremities purposefully ?Sensory: ?Withdraws to pain in all extremities, no gross sensation deficit ? ? ?ASSESSMENT/PLAN ?[name] is a 71 y.o. male with history of HTN, HLD, COPD, and CHF presenting after being found down on the ground more confused than normal. He lives with his son who was also admitted to the hospital, so the patient has not been seen by anyone for the last couple of days.  Repeat head CT today showed unchanged parenchymal and intraventricular hemorrhage. Lateral ventriculomegaly is mildly improved. MRI ordered for tomorrow morning. Hold ASA and plavix. CTA head  reveals occluded left V4 segment, likely chronic and stenosis of right vertebral artery with severe stenosis at V1 and V2 segments.Will discontinue lorazepam and add Seroquel for nighttime agitation.  Speech cleared for diet today.  Resume blood pressure meds p.o., cleviprex d/c'd. ? ?Stroke:  Acute IPH with IVH of the right lateral ventricle likely secondary hypertensive source ?Code Stroke Head CT- 12 mm focus of acute hemorrhage within the periventricular white matter adjacent to the body of the right lateral ventricle. Intraventricular hemorrhage appears to reflect extension of this hematoma. There is resulting mild communicating hydrocephalus. ?Repeat head CT- Unchanged parenchymal and intraventricular hemorrhage. Lateral ventriculomegaly is mildly improved. ?CTA head and neck  occluded left V4 segment, likely chronic and stenosis of right vertebral artery with severe stenosis at V1 and V2 segments. ?MRI  absent flow void in right vertebral and proximal basilar artery with distal reconstitution, fetal PCAs, punctate acute infarct in right frontal cortex, unchanged ICH ?2D Echo no atrial level shunt, EF not reported ?LDL 38 ?HgbA1c 7.4 ?VTE prophylaxis - SCDs, started lovenox today. ?aspirin 81 mg daily and clopidogrel 75 mg daily prior to admission, now on No antithrombotic. IPH with IVH ?Therapy  recommendations:  SNF ?Disposition:  pending ? ?Hypertension ?Home meds:  lasix, aldactone, coreg ?Stable ?BP relax goal to 160 as imaging is stable ?Hold lasix today for hypotension ? ?Hyperlipidemia ?Home meds:  Atorvastatin 80mg  ?LDL 38, goal < 70  ?Resume when medically appropriate ? ?Diabetes type II Controlled ?Home meds:  metformin, jardiance, trulicity ?HgbA1c 7.4, goal < 7.0 ?CBGs ?SSI ? ?Other Stroke Risk Factors ?Advanced Age >/= 34  ?Cigarette smoker, advised to stop smoking ?ETOH use, alcohol level No results found for requested labs within last 26280 hours., advised to drink no more than 2 drink(s) a day ?Coronary artery disease ?Previously on ASA and plavix ?Congestive heart failure with reduced ejection fraction ?Home meds: Entresto, lasix, aldactone, jardiance, imdur- resumed ? ?Other Active Problems ?BPH- flomax resumed ?Centrilobular emphysema ?CKD stage 3 ?Atherosclerosis or native arteries with intermittent claudication ?Left BKA on 11/10/2020 with vascular surgery ?Agitation ?Seroquel initiated at 50mg  HS, but he was lethargic this morning. Decrease dose to 25mg  PRN and try tonight if it is needed. ? ?Hospital day # 5 ? ?Patient seen and examined by NP/APP with MD. MD to update note as needed.  ? ?[name], DNP, FNP-BC ?[facility] ?Pager: [phone] ? ?ATTENDING ATTESTATION: ? ?   ?71 year old gentleman with a past medical history of hyperlipidemia hypertension right corona radiata intracranial hemorrhage with intraventricular extension etiology is likely hypertensive.   chronic right vertebral artery severe stenosis at V1 V2 occlusion of the left V4 segment after PICA origin.  It appears to be chronic and unrelated to his intracranial hemorrhage. Angiogram when he is more stable per Dr. Estanislado Pandy.  ? ?He was more sedated this morning likely due to Seroquel 50 mg at night.  His mentation improved throughout the day.  He was transitioned out of the ICU.  Stop Seroquel.  He  gets sedated easily would avoid benzos.  If he needs anything please call overnight team and consider a small dose of Seroquel 12.5-25mg  prn.  We will contact Dr. Estanislado Pandy for angio. ? ?Dr. Reeves Forth evaluated pt independently, reviewed imaging, chart, labs. Discussed and formulated plan with the APP. Please see APP note above for details.   ?  ?This patient is critically ill due to respiratory distress, agitation from ETOH abuse and ICH  and at significant risk of neurological worsening, death form heart failure, respiratory failure, recurrent stroke, bleeding from [facility], seizure, sepsis. This patient's care requires constant monitoring of vital signs, hemodynamics, respiratory and cardiac monitoring, review of multiple databases, neurological assessment, discussion with family, other specialists and medical decision making of high complexity. I spent 35 minutes of neurocritical care time in the care of this patient.  ?  ?Naiah Donahoe,MD  ? ?Zailey Audia,MD  ?  ?To contact Stroke Continuity provider, please refer to [email or web address] ?After hours, contact General Neurology ? ?

## 2021-10-05 NOTE — Progress Notes (Signed)
Alerted Dr. Viviann Spare and NP Elmer Picker that the patient had been lethargic throughout the morning.  Which is different than what was observed yesterday.  The patient will still follow commands but takes repeated stimuli to do so.  MD and NP came and assessed patient.  Was told that if he doesn't wake up more to send a message this afternoon.  Will continue to observe and act accordingly.   ?

## 2021-10-05 NOTE — Progress Notes (Signed)
Speech Language Pathology Treatment: Dysphagia  ?Patient Details ?Name: Danny Vanover Schiavi Sr. ?MRN: [id number] ?DOB: Jan 02, 1951 ?Today's Date: 10/05/2021 ?Time: 1245-1300 ?SLP Time Calculation (min) (ACUTE ONLY): 15 min ? ?Assessment / Plan / Recommendation ?Clinical Impression ? The patient was seen at bedside for dysphagia treatment. RN reported that the patient received seroquel last night and has been lethargic throughout the day. Patient was presented with thin liquids via straw cup. Patient required maximal hand-over-hand support to engage in assisted feeding. Patient accepted ~3 consecutive sips of thin liquid. Patient immediately coughed on trials of thin liquid and displayed wet vocal quality. SLP incorporated trials of puree applesauce, and patient tolerated puree with no overt s/sx of aspiration or penetration. SLP recommends patient be NPO at this time, but may receive medications crushed in puree. SLP to follow up next date for MBS instrumental testing readiness.  ?HPI HPI: Patient is a 71 y.o. male with PMH: HTN, HLD, dementia, COPD, HLD, CAD, DM-2. Typically he has supervision and assistance from his son, but unfortunately his son was admitted and continues to be admitted at hospital after suffering intracranial hemorrhage. Patient admitted to hospital after a family member who was checking on him found him down on the ground and more confused than usual. Family member brought him to [facility] for evaluation and CT head revealed ICH with intraventricular extension. Patient was then transferred to [facility]. ?  ?   ?SLP Plan ? MBS ? ?  ?  ?Recommendations for follow up therapy are one component of a multi-disciplinary discharge planning process, led by the attending physician.  Recommendations may be updated based on patient status, additional functional criteria and insurance authorization. ?  ? ?Recommendations  ?Diet recommendations: NPO ?Medication Administration: Crushed with puree ?Supervision: Full  supervision/cueing for compensatory strategies ?Postural Changes and/or Swallow Maneuvers: Seated upright 90 degrees  ?   ?    ?   ? ? ? ? Oral Care Recommendations: Oral care BID ?Follow Up Recommendations: Skilled nursing-short term rehab (<3 hours/day) ?Assistance recommended at discharge: Frequent or constant Supervision/Assistance ?SLP Visit Diagnosis: Dysphagia, unspecified (R13.10) ?Plan: MBS ? ? ? ? ?  ?  ? ? ?Ezekiel Slocumb ? ?10/05/2021, 2:12 PM ?

## 2021-10-06 ENCOUNTER — Encounter (HOSPITAL_COMMUNITY): Payer: Self-pay | Admitting: Neurology

## 2021-10-06 ENCOUNTER — Inpatient Hospital Stay (HOSPITAL_COMMUNITY): Payer: Medicare HMO

## 2021-10-06 DIAGNOSIS — I610 Nontraumatic intracerebral hemorrhage in hemisphere, subcortical: Secondary | ICD-10-CM | POA: Diagnosis not present

## 2021-10-06 LAB — CBC
HCT: 47.7 % (ref 39.0–52.0)
Hemoglobin: 15.2 g/dL (ref 13.0–17.0)
MCH: 30.1 pg (ref 26.0–34.0)
MCHC: 31.9 g/dL (ref 30.0–36.0)
MCV: 94.5 fL (ref 80.0–100.0)
Platelets: 227 10*3/uL (ref 150–400)
RBC: 5.05 MIL/uL (ref 4.22–5.81)
RDW: 13.4 % (ref 11.5–15.5)
WBC: 8.6 10*3/uL (ref 4.0–10.5)
nRBC: 0 % (ref 0.0–0.2)

## 2021-10-06 LAB — BASIC METABOLIC PANEL
Anion gap: 9 (ref 5–15)
BUN: 21 mg/dL (ref 8–23)
CO2: 23 mmol/L (ref 22–32)
Calcium: 8.9 mg/dL (ref 8.9–10.3)
Chloride: 106 mmol/L (ref 98–111)
Creatinine, Ser: 0.93 mg/dL (ref 0.61–1.24)
GFR, Estimated: >60 mL/min (ref >60)
Glucose, Bld: 126 mg/dL — ABNORMAL HIGH (ref 70–99)
Potassium: 4.5 mmol/L (ref 3.5–5.1)
Sodium: 138 mmol/L (ref 135–145)

## 2021-10-06 LAB — GLUCOSE, CAPILLARY
Glucose-Capillary: 119 mg/dL — ABNORMAL HIGH (ref 70–99)
Glucose-Capillary: 145 mg/dL — ABNORMAL HIGH (ref 70–99)
Glucose-Capillary: 152 mg/dL — ABNORMAL HIGH (ref 70–99)
Glucose-Capillary: 159 mg/dL — ABNORMAL HIGH (ref 70–99)
Glucose-Capillary: 240 mg/dL — ABNORMAL HIGH (ref 70–99)

## 2021-10-06 MED ORDER — CHLORHEXIDINE GLUCONATE 0.12 % MT SOLN
15.0000 mL | Freq: Two times a day (BID) | OROMUCOSAL | Status: DC
  Administered 2021-10-06 – 2021-10-10 (×7): 15 mL via OROMUCOSAL
  Filled 2021-10-06 (×9): qty 15

## 2021-10-06 MED ORDER — FUROSEMIDE 20 MG PO TABS
20.0000 mg | ORAL_TABLET | Freq: Every day | ORAL | Status: DC
  Administered 2021-10-06 – 2021-10-10 (×5): 20 mg via ORAL
  Filled 2021-10-06 (×5): qty 1

## 2021-10-06 MED ORDER — ASPIRIN EC 81 MG PO TBEC
81.0000 mg | DELAYED_RELEASE_TABLET | Freq: Every day | ORAL | Status: DC
  Administered 2021-10-06: 81 mg via ORAL
  Filled 2021-10-06: qty 1

## 2021-10-06 MED ORDER — CARVEDILOL 12.5 MG PO TABS
25.0000 mg | ORAL_TABLET | Freq: Two times a day (BID) | ORAL | Status: DC
  Administered 2021-10-06 – 2021-10-10 (×9): 25 mg via ORAL
  Filled 2021-10-06 (×9): qty 2

## 2021-10-06 MED ORDER — ORAL CARE MOUTH RINSE
15.0000 mL | Freq: Two times a day (BID) | OROMUCOSAL | Status: DC
  Administered 2021-10-06 – 2021-10-09 (×5): 15 mL via OROMUCOSAL

## 2021-10-06 MED ORDER — CEFAZOLIN SODIUM-DEXTROSE 2-4 GM/100ML-% IV SOLN
2.0000 g | INTRAVENOUS | Status: AC
  Filled 2021-10-06 (×2): qty 100

## 2021-10-06 MED ORDER — AMLODIPINE BESYLATE 5 MG PO TABS
5.0000 mg | ORAL_TABLET | Freq: Every day | ORAL | Status: DC
  Administered 2021-10-06 – 2021-10-10 (×5): 5 mg via ORAL
  Filled 2021-10-06 (×5): qty 1

## 2021-10-06 MED ORDER — HEPARIN SODIUM (PORCINE) 5000 UNIT/ML IJ SOLN
5000.0000 [IU] | Freq: Three times a day (TID) | INTRAMUSCULAR | Status: DC
  Administered 2021-10-07 – 2021-10-10 (×10): 5000 [IU] via SUBCUTANEOUS
  Filled 2021-10-06 (×10): qty 1

## 2021-10-06 NOTE — Progress Notes (Signed)
Occupational Therapy Treatment ?Patient Details ?Name: Danny Marett Penalver Sr. ?MRN: [id number] ?DOB: 07/24/1951 ?Today's Date: 10/06/2021 ? ? ?History of present illness Danny WEDDELL Sr. is a 71 y.o. male who presented to [facility] after being found down by family. CT: positive for R IVH.  Transferred to [facility]. MRI: Punctate acute infarct in the right frontal cortex and unchanged hemorrhagic volume and ventriculomegaly. PHMx: hypertension, COPD, DM, CAD CHF, PAD s/p L BKA 11/2020, anddementia. ?  ?OT comments ? Patient received in supine and agreeable to OT/PT session. Patient was max assist to get to EOB with verbal cues for patient participation. Patient demonstrating left lateral and posterior leaning requiring max to mod assist for sitting balance. Lateral leaning and trunk flexion exercises performed with improved sitting balance.  Patient performed 2 stands from EOB with max assist +2 and without left prosthesis.  Patient making better progress with increased level of arousal. Acute OT to continue to follow.   ? ?Recommendations for follow up therapy are one component of a multi-disciplinary discharge planning process, led by the attending physician.  Recommendations may be updated based on patient status, additional functional criteria and insurance authorization. ?   ?Follow Up Recommendations ? Skilled nursing-short term rehab (<3 hours/day)  ?  ?Assistance Recommended at Discharge Frequent or constant Supervision/Assistance  ?Patient can return home with the following ? Two people to help with walking and/or transfers;A lot of help with bathing/dressing/bathroom;Assistance with cooking/housework;Assistance with feeding;Help with stairs or ramp for entrance;Assist for transportation;Direct supervision/assist for financial management;Direct supervision/assist for medications management ?  ?Equipment Recommendations ? BSC/3in1;Tub/shower bench  ?  ?Recommendations for Other Services   ? ?   ?Precautions / Restrictions Precautions ?Precautions: Fall ?Restrictions ?Weight Bearing Restrictions: No ?Other Position/Activity Restrictions: Prosthesis is at home  ? ? ?  ? ?Mobility Bed Mobility ?Overal bed mobility: Needs Assistance ?Bed Mobility: Supine to Sit, Sit to Supine ?  ?  ?Supine to sit: Max assist ?Sit to supine: Max assist, +2 for physical assistance ?  ?General bed mobility comments: patient required cues to use rail to assist. ?  ? ?Transfers ?Overall transfer level: Needs assistance ?Equipment used: 2 person hand held assist ?Transfers: Sit to/from Stand ?Sit to Stand: Max assist, +2 physical assistance ?  ?  ?  ?  ?  ?General transfer comment: stood x2 from EOB with max assist on RLE due to no prosthesis for left ?  ?  ?Balance Overall balance assessment: Needs assistance ?Sitting-balance support: Bilateral upper extremity supported, Feet supported ?Sitting balance-[name]: Fair ?Sitting balance - Comments: posterior and left lateral leaning requiring max assist. Was able t maintain balance for 1+ minute following lateral leaning and trunk flexion exercises ?Postural control: Left lateral lean, Posterior lean ?Standing balance support: Bilateral upper extremity supported ?Standing balance-[name]: Poor ?Standing balance comment: max assist +2 to stand from EOB ?  ?  ?  ?  ?  ?  ?  ?  ?  ?  ?  ?   ? ?ADL either performed or assessed with clinical judgement  ? ?ADL   ?  ?  ?  ?  ?  ?  ?  ?  ?  ?  ?  ?  ?  ?  ?  ?  ?  ?  ?  ?  ?  ? ?Extremity/Trunk Assessment Upper Extremity Assessment ?RUE Sensation: decreased light touch ?RUE Coordination: WNL ?LUE Sensation: decreased light touch ?  ?  ?  ?  ?  ? ?  Vision   ?  ?  ?Perception   ?  ?Praxis   ?  ? ?Cognition Arousal/Alertness: Awake/alert ?Behavior During Therapy: Flat affect ?Overall Cognitive Status: History of cognitive impairments - at baseline ?  ?  ?  ?  ?  ?  ?  ?  ?  ?  ?  ?  ?  ?  ?  ?  ?General Comments: alert and required  increased time to follow directions ?  ?  ?   ?Exercises   ? ?  ?Shoulder Instructions   ? ? ?  ?General Comments    ? ? ?Pertinent Vitals/ Pain       Pain Assessment ?Pain Assessment: Faces ?Faces Pain Scale: Hurts little more ?Pain Location: headache ?Pain Descriptors / Indicators: Headache ?Pain Intervention(s): Monitored during session, Premedicated before session ? ?Home Living   ?  ?  ?  ?  ?  ?  ?  ?  ?  ?  ?  ?  ?  ?  ?  ?  ?  ?  ? ?  ?Prior Functioning/Environment    ?  ?  ?  ?   ? ?Frequency ? Min 2X/week  ? ? ? ? ?  ?Progress Toward Goals ? ?OT Goals(current goals can now be found in the care plan section) ? Progress towards OT goals: Progressing toward goals ? ?Acute Rehab OT Goals ?OT Goal Formulation: Patient unable to participate in goal setting ?Time For Goal Achievement: 10/18/21 ?Potential to Achieve Goals: Fair ?ADL Goals ?Pt Will Perform Grooming: with min assist;sitting ?Pt Will Perform Upper Body Bathing: with min assist;sitting ?Pt Will Perform Upper Body Dressing: with min assist;sitting ?Pt Will Transfer to Toilet: with min assist;stand pivot transfer;bedside commode ?Pt/caregiver will Perform Home Exercise Program: Increased strength;Both right and left upper extremity;With minimal assist  ?Plan Discharge plan remains appropriate   ? ?Co-evaluation ? ? ? PT/OT/SLP Co-Evaluation/Treatment: Yes ?Reason for Co-Treatment: For patient/therapist safety ?  ?OT goals addressed during session: Strengthening/ROM ?  ? ?  ?AM-PAC OT "6 Clicks" Daily Activity     ?Outcome Measure ? ? Help from another person eating meals?: A Lot ?Help from another person taking care of personal grooming?: A Lot ?Help from another person toileting, which includes using toliet, bedpan, or urinal?: A Lot ?Help from another person bathing (including washing, rinsing, drying)?: A Lot ?Help from another person to put on and taking off regular upper body clothing?: A Lot ?Help from another person to put on and taking off  regular lower body clothing?: A Lot ?6 Click Score: 12 ? ?  ?End of Session Equipment Utilized During Treatment: Gait belt ? ?OT Visit Diagnosis: Muscle weakness (generalized) (M62.81);Other symptoms and signs involving cognitive function ?  ?Activity Tolerance Patient tolerated treatment well;Patient limited by fatigue ?  ?Patient Left in bed;with call bell/phone within reach;with bed alarm set ?  ?Nurse Communication Mobility status ?  ? ?   ? ?Time: [id number] ?OT Time Calculation (min): 32 min ? ?Charges: OT General Charges ?$OT Visit: 1 Visit ?OT Treatments ?$Therapeutic Activity: 8-22 mins ? ?Lodema Hong, OTA ?Acute Rehabilitation Services  ?Pager [phone] ?Office [phone] ? ? ?[place] ?10/06/2021, 3:41 PM ?

## 2021-10-06 NOTE — Progress Notes (Signed)
Modified Barium Swallow Progress Note ? ?Patient Details  ?Name: Danny Orner Lacosse Sr. ?MRN: [id number] ?Date of Birth: 11-26-1950 ? ?Today's Date: 10/06/2021 ? ?Modified Barium Swallow completed.  Full report located under Chart Review in the Imaging Section. ? ?Brief recommendations include the following: ? ?Clinical Impression ? Patient was seen for MBS. Pt demonstrates a mild to moderate primary oropharyngeal dysphagia with a moderate aspiration risk. Patient has baseline cognitive-linguistic deficits leading to further aspiration concerns. Oral phase of swallowing impaired as evidenced by lingual residue, delayed oral transit, tongue thrusting, and reduced tongue to palate contact. Mastication slightly prolonged possibly 2/2 patient's inadequate dentition and not having dentures. Pharyngeal swallow initiation occurred at the level of the valleculae, which is WNL. Adequate laryngeal elevation and anterior movement observed. Epiglottic deflection was efficienct. Patient penetrated with thin liquids via straw cup. However, he spontaneously and independently cleared penetrates. Residuals observed in the valleculae and posterior pharynx; pt independently and spontaneously cleared residuals with subsequent swallow. With mixed consistency of pill and thin liquid, patient aspirated thin liquid and was unable to clear aspirate despite efforts. Patient's aspiration is a result of piecemeal swallow sequence and delayed swallow initiation to the level of the pyriforms. SLP recommends initiating a Dys 3 Diet with Thin Liquids. Medications should be administered whole in puree at this time. In future sessions, treatment should focus on slow rate with small bites/sips facilitated to patient to aid in timing of pharyngeal swallow sequence. ?  ?Swallow Evaluation Recommendations ? ?   ? ? SLP Diet Recommendations: Dysphagia 3 (Mech soft) solids;Thin liquid ? ? Liquid Administration via: Straw ? ? Medication Administration: Whole with  puree ? ? Supervision: Full assist for feeding ? ? Compensations: Minimize environmental distractions;Slow rate;Small sips/bites ? ? Postural Changes: Seated upright at 90 degrees ? ? Oral Care Recommendations: Oral care BID ? ?   ? ? ? ?Ezekiel Slocumb, Student-SLP ? ?

## 2021-10-06 NOTE — Progress Notes (Addendum)
STROKE TEAM PROGRESS NOTE  ? ?INTERVAL HISTORY ?Patient is seen in his room with no family at the bedside Patient was awake and interactive, saying he is tired. "I'm in the hospital right now". Oriented to self only, reported May 3rd and that he's at home. ? ? ?Vitals:  ? 10/06/21 0418 10/06/21 0734 10/06/21 0740 10/06/21 0830  ?BP: (!) 139/94 (!) 157/104 (!) 125/110 (!) 156/100  ?Pulse: 88 84    ?Resp:  20    ?Temp:  98.5 ?F (36.9 ?C)    ?TempSrc:  Oral    ?SpO2:  97%    ?Weight:      ?Height:      ? ?CBC:  ?Recent Labs  ?Lab 10/05/21 ?[id number] 10/06/21 ?0355  ?WBC 9.5 8.6  ?HGB 15.0 15.2  ?HCT 46.0 47.7  ?MCV 94.5 94.5  ?PLT 211 227  ? ? ?Basic Metabolic Panel:  ?Recent Labs  ?Lab 10/05/21 ?[id number] 10/06/21 ?0355  ?NA 138 138  ?K 4.0 4.5  ?CL 109 106  ?CO2 22 23  ?GLUCOSE 119* 126*  ?BUN 29* 21  ?CREATININE 1.08 0.93  ?CALCIUM 8.6* 8.9  ? ? ?Lipid Panel:  ?Recent Labs  ?Lab 10/03/21 ?1018  ?CHOL 112  ?TRIG 147  ?HDL 44  ?CHOLHDL 2.5  ?VLDL 29  ?[place] 39  ? ? ?HgbA1c:  ?Recent Labs  ?Lab 10/03/21 ?1018  ?HGBA1C 7.3*  ? ? ?Urine Drug Screen: No results for input(s): LABOPIA, COCAINSCRNUR, LABBENZ, AMPHETMU, THCU, LABBARB in the last 168 hours.  ?Alcohol Level No results for input(s): ETH in the last 168 hours. ? ?IMAGING past 24 hours ?No results found. ? ?PHYSICAL EXAM ? ?Physical Exam  ?Constitutional: Appears well-developed and well-nourished.  ?MSK: no joint deformities.bka left leg ?Respiratory: Effort normal, non-labored breathing ?Skin: WDI ? ?Neuro: ?Mental Status: Oriented to self only, more alert and interactive than he was previously Follows all commands ?No signs of aphasia or neglect ?Cranial Nerves: ?II: PERRL ?III,IV, VI: EOMI without ptosis or diploplia.  ?V: Facial sensation is symmetric to light touch ?VII: Slight left facial droop ?VIII: Hearing is intact to voice ?  ?Motor: ?Tone is normal. Bulk is normal. Moves all extremities purposefully, RUE and RLE 5/5 strength, LUE 4/5 strength, does not move  LLE. ?Sensory: ?No gross sensation deficit ? ? ?ASSESSMENT/PLAN ?Mr. Cristopher Estimable Howdeshell Sr. is a 71 y.o. male with history of HTN, HLD, COPD, and CHF presenting after being found down on the ground more confused than normal. He lives with his son who was also admitted to the hospital, so the patient has not been seen by anyone for the last couple of days.  Repeat head CT today showed unchanged parenchymal and intraventricular hemorrhage. Lateral ventriculomegaly is mildly improved.  Hold ASA and plavix. CTA head  reveals occluded left V4 segment, likely chronic and stenosis of right vertebral artery with severe stenosis at V1 and V2 segments. Transferred out of ICU yesterday.  ? ? ?Stroke:  Acute IPH with IVH of the right lateral ventricle likely secondary hypertensive source ?Code Stroke Head CT- 12 mm focus of acute hemorrhage within the periventricular white matter adjacent to the body of the right lateral ventricle. Intraventricular hemorrhage appears to reflect extension of this hematoma. There is resulting mild communicating hydrocephalus. ?Repeat head CT- Unchanged parenchymal and intraventricular hemorrhage. Lateral ventriculomegaly is mildly improved. ?CTA head and neck occluded left V4 segment, likely chronic and stenosis of right vertebral artery with severe stenosis at V1 and V2 segments. ?MRI  absent  flow void in right vertebral and proximal basilar artery with distal reconstitution, fetal PCAs, punctate acute infarct in right frontal cortex, unchanged ICH ?2D Echo no atrial level shunt, EF not reported ?LDL 38 ?HgbA1c 7.4 ?VTE prophylaxis - SCDs, started lovenox  ?aspirin 81 mg daily and clopidogrel 75 mg daily prior to admission, now on No antithrombotic. IPH with IVH. Aspirin 81mg  started 3/3. ?Therapy recommendations:  SNF ?Disposition:  pending ? ?Hypertension ?Home meds:  lasix, aldactone, coreg ?Stable ?BP relax goal to 160 as imaging is stable ?Hold lasix today for  hypotension ? ?Hyperlipidemia ?Home meds:  Atorvastatin 80mg  ?LDL 38, goal < 70  ?Resume when medically appropriate ? ?Diabetes type II Controlled ?Home meds:  metformin, jardiance, trulicity ?HgbA1c 7.4, goal < 7.0 ?CBGs ?SSI ? ?Other Stroke Risk Factors ?Advanced Age >/= 46  ?Cigarette smoker, advised to stop smoking ?ETOH use, alcohol level No results found for requested labs within last 26280 hours., advised to drink no more than 2 drink(s) a day ?Coronary artery disease ?Previously on ASA and plavix ?Congestive heart failure with reduced ejection fraction ?Home meds: Entresto, lasix, aldactone, jardiance, imdur- resumed ? ?Other Active Problems ?BPH- flomax resumed ?Centrilobular emphysema ?CKD stage 3 ?Atherosclerosis or native arteries with intermittent claudication ?Left BKA on 11/10/2020 with vascular surgery ?Agitation ?Seroquel initiated at 50mg  HS, but he was lethargic this morning. Decrease dose to 25mg  PRN and try tonight if it is needed. ? ?Hospital day # 6 ? ?Patient seen and examined by NP/APP with MD. MD to update note as needed.  ? ?[place] , MSN, AGACNP-BC ?[facility] ?See Amion for schedule and pager information ?10/06/2021 12:18 PM ? ?ATTENDING ATTESTATION: ?  ?   ?71 year old gentleman with a past medical history of hyperlipidemia hypertension right corona radiata intracranial hemorrhage with intraventricular extension etiology is likely hypertensive.   chronic right vertebral artery severe stenosis at V1 V2 occlusion of the left V4 segment after PICA origin.  It appears to be chronic and unrelated to his intracranial hemorrhage. Diagnostic Angiogram with Dr. Estanislado Pandy on Monday. ?  ?He was transitioned to the floor. Doing well. BP slightly elevated. Changed lasix to daily. Consider adding another agent amlodipine tomorrow if needed.  ?Asa 81mg  added today since he is stable and almost a week out. ?  ?Dr. Reeves Forth evaluated pt independently, reviewed imaging, chart, labs.  Discussed and formulated plan with the APP. Please see APP note above for details.  ? ?Total of 35 mins spent reviewing chart, discussion with patient and family on prognosis, Dx and plan. Discussed case with patient's nurse. Reviewed Imaging personally.   ?  ?  ?Kathryne Ramella,MD  ?  ?  ?To contact Stroke Continuity provider, please refer to [email or web address] ?After hours, contact General Neurology ? ?

## 2021-10-06 NOTE — Consult Note (Signed)
Chief Complaint: Patient was seen in consultation today for diagnostic cerebral angiogram at the request of the Stroke Team   Referring Physician(s): the Stroke Team   Supervising Physician: Luanne Bras  Patient Status: [facility] - In-pt  History of Present Illness: Danny BORGHESE Sr. is a 71 y.o. male with PMHs of HTN, HLD, CAD, COPD, DM2, CKD, PVD status post left below-knee amputation whowas brought to [facility] ED on 2/25 due to weakness and multiple falls.  CT head showed:   12 mm focus of acute hemorrhage within the periventricular white matter adjacent to the body of the right lateral ventricle. Intraventricular hemorrhage appears to reflect extension of this hematoma. There is resulting mild communicating hydrocephalus.  Patient was transferred to [facility] and admitted to NICU for further evaluation and management. CTA head and neck on 2/27 showed:  1. No culprit aneurysm or AVM identified. 2. Multifocal stenosis of the right vertebral artery with severe stenoses at the V1 and V2 segments. 3. Occluded left V4 segment after the PICA origin, likely chronic. Otherwise, patent intracranial vasculature with the PCAs primarily supplied by prominent posterior communicating arteries. 4. Mild calcified atherosclerotic plaque at the carotid bifurcations and intracranial ICAs without hemodynamically significant stenosis or occlusion. 5. Small probable penetrating atherosclerotic ulcer along the posteromedial margin of the aortic arch.  NIR was requested for diagnostic cerebral angiogram for further evaluation.  Patient laying in bed, not in acute distress. RN at bedside. Reports frontal HA, not able to discribe the pain nor to state the severity.  Denise fever, chills, shortness of breath, cough, chest pain, abdominal pain, nausea ,vomiting, and bleeding. Patient appears lethargic, states that he has not been able to sleep well due to him being in the hospital.  Past Medical History:   Diagnosis Date   CAD (coronary artery disease)    a. 01/2010 PCI of LAD; b. 09/2014 PCI/DES of mLAD due to ISR. D1 80, D2 80(jailed), LCX 66m; c. 07/2016 NSTEMI/Cath: LM nl, LAD 29m ISR, 40d, D1 90ost, 80p, D2 90ost, RI min irregs, LCX min irregs, OM1 90 small, OM2/3 min irregs, RCA min irregs, RPLB1 90, EF 25-35%.   Chronic combined systolic and diastolic CHF (congestive heart failure) ([place])    a. 07/2016 Echo: EF 30%, severe septal/anterior HK, Gr1 DD.   CKD (chronic kidney disease), stage III (HCC)    COPD (chronic obstructive pulmonary disease) (HCC)    DM2 (diabetes mellitus, type 2) (HCC)    Erectile dysfunction    HLD (hyperlipidemia)    HTN (hypertension)    Ischemic cardiomyopathy    a. 07/2016 Echo: EF 30% w/ sev septal/ant HK. Gr1 DD.    Past Surgical History:  Procedure Laterality Date   AMPUTATION Left 11/10/2020   Procedure: AMPUTATION BELOW KNEE;  Surgeon: [name], MD;  Location: [facility] ORS;  Service: General;  Laterality: Left;   BELOW KNEE LEG AMPUTATION     CAD: stent to the LAD     CARDIAC CATHETERIZATION  10/01/2014   CARDIAC CATHETERIZATION N/A 07/23/2016   Procedure: Left Heart Cath and Coronary Angiography;  Surgeon: [name], MD;  Location: [place] [facility];  Service: Cardiovascular;  Laterality: N/A;   CORONARY ANGIOPLASTY WITH STENT PLACEMENT  10/01/2014   LOWER EXTREMITY ANGIOGRAPHY Left 10/31/2020   Procedure: LOWER EXTREMITY ANGIOGRAPHY;  Surgeon: [name], MD;  Location: [place] [facility];  Service: Cardiovascular;  Laterality: Left;    Allergies: Patient has no known allergies.  Medications: Prior to  Admission medications   Medication Sig Start Date End Date Taking? Authorizing Provider  albuterol (PROVENTIL) (2.5 MG/3ML) 0.083% nebulizer solution Take 3 mLs (2.5 mg total) by nebulization every 6 (six) hours as needed for wheezing or shortness of breath. 09/30/20   Glori LuisSonnenberg, Eric G, MD  Albuterol Sulfate (PROAIR RESPICLICK) 108 (90  Base) MCG/ACT AEPB Inhale 1 puff into the lungs every 6 (six) hours as needed. 08/24/21   McLean-Scocuzza, Pasty Spillersracy N, MD  aspirin 81 MG tablet Take 81 mg by mouth daily.    [provider]  atorvastatin (LIPITOR) 80 MG tablet TAKE 1 TABLET BY MOUTH EVERY DAY 12/09/20   Glori LuisSonnenberg, Eric G, MD  carvedilol (COREG) 12.5 MG tablet Take 1 tablet (12.5 mg total) by mouth 2 (two) times daily with a meal. 01/24/21   Glori LuisSonnenberg, Eric G, MD  clopidogrel (PLAVIX) 75 MG tablet TAKE 1 TABLET BY MOUTH EVERY DAY 04/17/21   Antonieta IbaGollan, Timothy J, MD  Continuous Blood Gluc Sensor (FREESTYLE LIBRE 2 SENSOR) MISC Apply every 14 days 09/22/21   Glori LuisSonnenberg, Eric G, MD  ENTRESTO 49-51 MG TAKE 1 TABLET BY MOUTH 2 (TWO) TIMES DAILY. STOP LISINOPRIL AND POTASSIUM 05/25/21   Glori LuisSonnenberg, Eric G, MD  escitalopram (LEXAPRO) 10 MG tablet TAKE 1 TABLET BY MOUTH EVERY DAY 03/15/21   Glori LuisSonnenberg, Eric G, MD  fluticasone-salmeterol (ADVAIR) 250-50 MCG/ACT AEPB Inhale 1 puff into the lungs 2 (two) times daily. 07/24/21   Glori LuisSonnenberg, Eric G, MD  Fluticasone-Umeclidin-Vilant (TRELEGY ELLIPTA) 100-62.5-25 MCG/ACT AEPB Inhale 1 puff into the lungs daily. Medication Samples have been provided to the patient.  Drug name: trelegy 100    Strength: 100/25      Qty: 2 LOT: [id number] 10/2021 Lot [id number] 04/2022  Dosing instructions: 1 puff qd rinse mouth  The patient has been instructed regarding the correct time, dose, and frequency of taking this medication, including desired effects and most common side effects.   Pasty Spillersracy N McLean-Scocuzza 10:50 AM 08/24/2021 Patient taking differently: Inhale 1 puff into the lungs daily. Medication Samples have been provided to the patient. Drug name: trelegy 100    Strength: 100/25      Qty: 2 LOT: [id number] 10/2021 Lot [id number] 04/2022 Dosing instructions: 1 puff qd rinse mouth The patient has been instructed regarding the correct time, dose, and frequency of taking this medication, including desired effects and most common side  effects.  Pasty Spillersracy N McLean-Scocuzza 10:50 AM 08/24/2021 08/24/21   McLean-Scocuzza, Pasty Spillersracy N, MD  furosemide (LASIX) 20 MG tablet TAKE 1 TABLET (20 MG) BY MOUTH ONCE EVERY OTHER DAY 04/17/21   Antonieta IbaGollan, Timothy J, MD  gabapentin (NEURONTIN) 300 MG capsule Take 1 capsule (300 mg total) by mouth at bedtime. Patient not taking: Reported on 08/24/2021 12/06/20   Georgiana SpinnerBrown, Fallon E, NP  insulin degludec (TRESIBA FLEXTOUCH) 100 UNIT/ML FlexTouch Pen Inject 40 Units into the skin daily. 03/29/21   Glori LuisSonnenberg, Eric G, MD  isosorbide mononitrate (IMDUR) 30 MG 24 hr tablet TAKE 1 TABLET BY MOUTH EVERY DAY 04/03/21   Antonieta IbaGollan, Timothy J, MD  JARDIANCE 25 MG TABS tablet TAKE 1 TABLET BY MOUTH DAILY 12/09/20   Glori LuisSonnenberg, Eric G, MD  metFORMIN (GLUCOPHAGE-XR) 500 MG 24 hr tablet Take 1 tablet (500 mg total) by mouth 2 (two) times daily with a meal. Patient taking differently: Take 500 mg by mouth daily with breakfast. 01/24/21   Glori LuisSonnenberg, Eric G, MD  spironolactone (ALDACTONE) 25 MG tablet Take 1 tablet (25 mg total) by mouth daily. 01/30/21  [name], MD  tamsulosin (FLOMAX) 0.4 MG CAPS capsule Take 1 capsule (0.4 mg total) by mouth daily. 04/01/21   [name], MD  TRULICITY 4.5 0000000 SOPN INJECT 4.5 MG AS DIRECTED ONCE A WEEK. 07/19/21   [name], MD     Family History  Problem Relation Age of Onset   Heart attack Father        complications   Cancer Brother     Social History   Socioeconomic History   Marital status: Single    Spouse name: Not on file   Number of children: Not on file   Years of education: Not on file   Highest education level: Not on file  Occupational History   Not on file  Tobacco Use   Smoking status: Former    Packs/day: 3.00    Years: 0.00    Pack years: 0.00    Types: Cigarettes    Quit date: 09/07/2009    Years since quitting: 12.0   Smokeless tobacco: Never   Tobacco comments:    quit june 2011  Vaping Use   Vaping Use: Never used  Substance  and Sexual Activity   Alcohol use: Yes    Comment: every other weekend   Drug use: No   Sexual activity: Not on file  Other Topics Concern   Not on file  Social History Narrative   Not on file   Social Determinants of Health   Financial Resource Strain: Low Risk    Difficulty of Paying Living Expenses: Not hard at all  Food Insecurity: No Food Insecurity   Worried About Charity fundraiser in the Last Year: Never true   [place] in the Last Year: Never true  Transportation Needs: Unmet Transportation Needs   Lack of Transportation (Medical): Yes   Lack of Transportation (Non-Medical): Yes  Physical Activity: Not on file  Stress: No Stress Concern Present   Feeling of Stress : Only a little  Social Connections: Unknown   Frequency of Communication with Friends and Family: Not on file   Frequency of Social Gatherings with Friends and Family: More than three times a week   Attends Religious Services: Not on Electrical engineer or Organizations: Not on file   Attends Archivist Meetings: Not on file   Marital Status: Not on file     Review of Systems: A 12 point ROS discussed and pertinent positives are indicated in the HPI above.  All other systems are negative.  Vital Signs: BP (!) 157/102 (BP Location: Right Arm)    Pulse 86    Temp 98.5 F (36.9 C) (Oral)    Resp 20    Ht 6' (1.829 m)    Wt 226 lb 6.6 oz (102.7 kg)    SpO2 95%    BMI 30.71 kg/m    Physical Exam Vitals reviewed.  Constitutional:      General: He is not in acute distress.    Appearance: Normal appearance. He is obese. He is not ill-appearing.     Comments: Somnolent   HENT:     Head: Normocephalic and atraumatic.     Mouth/Throat:     Mouth: Mucous membranes are moist.  Cardiovascular:     Rate and Rhythm: Normal rate and regular rhythm.     Pulses: Normal pulses.     Heart sounds: Normal heart sounds.  Pulmonary:     Effort: Pulmonary effort is  normal.     Comments:  Breath sound decreased in all lobes  Abdominal:     General: Bowel sounds are normal.     Palpations: Abdomen is soft.  Skin:    General: Skin is warm and dry.     Coloration: Skin is not jaundiced or pale.  Neurological:     Mental Status: He is oriented to person, place, and time.     Comments: Alert, awake, and oriented x3 Speech and comprehension intact PERRL  EOMs intact, no nystagmus or subjective diplopia. No facial asymmetry. Tongue midline  Motor power 4/5  No pronator drift.     Psychiatric:        Mood and Affect: Mood normal.        Behavior: Behavior normal.        Judgment: Judgment normal.    MD Evaluation Airway: WNL Heart: WNL Abdomen: WNL Chest/ Lungs: WNL ASA  Classification: 3 Mallampati/Airway Score: Two  Imaging: CT ANGIO HEAD W OR WO CONTRAST  Result Date: 10/02/2021 CLINICAL DATA:  Hemorrhagic stroke, follow-up EXAM: CT ANGIOGRAPHY HEAD AND NECK TECHNIQUE: Multidetector CT imaging of the head and neck was performed using the standard protocol during bolus administration of intravenous contrast. Multiplanar CT image reconstructions and MIPs were obtained to evaluate the vascular anatomy. Carotid stenosis measurements (when applicable) are obtained utilizing NASCET criteria, using the distal internal carotid diameter as the denominator. RADIATION DOSE REDUCTION: This exam was performed according to the departmental dose-optimization program which includes automated exposure control, adjustment of the mA and/or kV according to patient size and/or use of iterative reconstruction technique. CONTRAST:  1mL OMNIPAQUE IOHEXOL 350 MG/ML SOLN COMPARISON:  Brain MRI obtained earlier the same day, CT head dated 1 day prior FINDINGS: CTA NECK FINDINGS Aortic arch: There is mild calcified atherosclerotic plaque of the aortic arch. The origins of the major branch vessels are patent. The subclavian arteries are patent to the level imaged. There is a small outpouching  likely reflecting a penetrating atherosclerotic ulcer along the posteromedial margin of the aortic arch [id number] Right carotid system: The right common carotid artery is patent with mild soft and calcified plaque. There is calcified atherosclerotic plaque at the bifurcation without hemodynamically significant stenosis or occlusion. The internal and external carotid arteries are patent. There is no dissection or aneurysm. The right internal carotid artery has a partially medialized/retropharyngeal course. Left carotid system: The left common carotid artery is patent with scattered soft and calcified plaque. There is mixed plaque in the proximal left ICA resulting in less than 50% stenosis the distal left internal carotid artery is patent. The left external carotid artery is patent. Vertebral arteries: The right vertebral artery is patent at its origin. There is severe stenosis in the V1 segment primarily due to soft plaque (9-277). There is reconstitution of flow in the distal V1 segment. There is scattered plaque throughout the remainder of the vertebral artery resulting in additional moderate to severe stenoses at the C5-C6 and C4-C5 levels (9-244, 9-241, 10-127). There is severe stenosis at the origin of the left vertebral artery (10-140). The left vertebral artery is otherwise patent. There is no evidence of dissection or aneurysm. Skeleton: There is mild multilevel degenerative change of the cervical spine. There is no acute osseous abnormality or aggressive osseous lesion. Sclerotic lesions in the right and left frontal calvarium for present in 2018. Other neck: The soft tissues are unremarkable. Upper chest: There is emphysema and scarring in the lung apices. Review of the MIP  images confirms the above findings CTA HEAD FINDINGS Anterior circulation: Is calcified atherosclerotic plaque in the bilateral intracranial ICAs resulting in up to mild stenosis on the right The bilateral MCAs are patent. The bilateral  ACAs are patent. The anterior communicating artery is normal. There is no aneurysm or AVM. Posterior circulation: There is mild stenosis of the right vertebral artery at the PICA origin. The right vertebral artery is otherwise patent through the basilar artery. The left vertebral artery is occluded after the PICA origin. The basilar artery is patent with mild multifocal irregularity but no high-grade stenosis or occlusion. There are prominent bilateral posterior communicating arteries which primarily supply the posterior cerebral arteries, with small P1 segments bilaterally. The bilateral PCAs are patent There is no aneurysm or AVM. Venous sinuses: Not well evaluated due to bolus timing. Anatomic variants: Above. The intraparenchymal and intraventricular hemorrhage appears similar to the prior study from 1 day prior. The ventricles are stable in size. Review of the MIP images confirms the above findings IMPRESSION: 1. No culprit aneurysm or AVM identified. 2. Multifocal stenosis of the right vertebral artery with severe stenoses at the V1 and V2 segments. 3. Occluded left V4 segment after the PICA origin, likely chronic. Otherwise, patent intracranial vasculature with the PCAs primarily supplied by prominent posterior communicating arteries. 4. Mild calcified atherosclerotic plaque at the carotid bifurcations and intracranial ICAs without hemodynamically significant stenosis or occlusion. 5. Small probable penetrating atherosclerotic ulcer along the posteromedial margin of the aortic arch. Aortic Atherosclerosis (ICD10-I70.0) and Emphysema (ICD10-J43.9). Electronically Signed   By: Valetta Mole M.D.   On: 10/02/2021 12:00   DG Abd 1 View  Result Date: 10/03/2021 CLINICAL DATA:  NG tube placement EXAM: ABDOMEN - 1 VIEW COMPARISON:  10/01/2021 FINDINGS: Bowel gas pattern is nonspecific. No abnormal masses or calcifications are seen. Kidneys are partly obscured by bowel contents. Pelvis is not included in the  images. Tip of enteric tube is seen in the distal thoracic esophagus. There are linear densities in the mid and lower lung fields. Emphysematous changes are noted in the apical regions of both lungs. Costophrenic angles are clear. IMPRESSION: Tip of enteric tube is seen in the lower thoracic esophagus and should be advanced 10-15 cm to place the tip and side port within the stomach. Bowel gas pattern is nonspecific. There are increased markings in both mid and both lower lung fields suggesting atelectasis/pneumonia. Part of this finding may suggest underlying scarring. Electronically Signed   By: Elmer Picker M.D.   On: 10/03/2021 10:59   DG Abd 1 View  Result Date: 10/01/2021 CLINICAL DATA:  Abdominal distention, CVA EXAM: ABDOMEN - 1 VIEW COMPARISON:  None. FINDINGS: Bowel gas pattern is nonspecific. Small amount of stool is seen in colon. No abnormal masses or calcifications are seen. Kidneys are partly obscured by bowel contents. Degenerative changes are noted in the lumbar spine. Arterial calcifications are seen. Vascular stent is seen in the left femoral artery. IMPRESSION: Nonspecific bowel gas pattern. Electronically Signed   By: Elmer Picker M.D.   On: 10/01/2021 14:36   CT HEAD WO CONTRAST (5MM)  Result Date: 10/01/2021 CLINICAL DATA:  Follow-up intracranial hemorrhage EXAM: CT HEAD WITHOUT CONTRAST TECHNIQUE: Contiguous axial images were obtained from the base of the skull through the vertex without intravenous contrast. RADIATION DOSE REDUCTION: This exam was performed according to the departmental dose-optimization program which includes automated exposure control, adjustment of the mA and/or kV according to patient size and/or use of iterative reconstruction technique.  COMPARISON:  Yesterday FINDINGS: Brain: Right corona radiata hemorrhage with extensive intraventricular extension, parenchymal component measuring 15 mm. Clot continues to the level of the lower fourth ventricle.  Lateral ventricular dilatation measures a few mm less than before and there is less rounding of the lateral ventricles on coronal reformats. Stable periventricular low-density. Negative for cortical infarct. Retro cerebellar CSF accumulation, incidental. Vascular: No hyperdense vessel or unexpected calcification. Skull: Normal. Negative for fracture or focal lesion. Sinuses/Orbits: No acute finding. IMPRESSION: Unchanged parenchymal and intraventricular hemorrhage. Lateral ventriculomegaly is mildly improved. Electronically Signed   By: Jorje Guild M.D.   On: 10/01/2021 09:42   CT HEAD WO CONTRAST (5MM)  Result Date: 09/30/2021 CLINICAL DATA:  Hemorrhagic stroke, follow-up EXAM: CT HEAD WITHOUT CONTRAST TECHNIQUE: Contiguous axial images were obtained from the base of the skull through the vertex without intravenous contrast. RADIATION DOSE REDUCTION: This exam was performed according to the departmental dose-optimization program which includes automated exposure control, adjustment of the mA and/or kV according to patient size and/or use of iterative reconstruction technique. COMPARISON:  Earlier same day FINDINGS: Brain: Similar area of parenchymal hemorrhage within the periventricular white matter along the body of the right lateral ventricle. Significant intraventricular hemorrhage is again identified. Stable hydrocephalus. No new hemorrhage. No new loss of gray-white differentiation. Stable findings of probable chronic microvascular ischemic changes in the cerebral white matter. 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 Vascular: No new abnormality. Skull: Calvarium is unremarkable. Sinuses/Orbits: No acute finding. Other: None. IMPRESSION: No significant change in small parenchymal hemorrhage adjacent to the body of the right lateral ventricle. Similar intraventricular hemorrhage and mild communicating hydrocephalus. Electronically Signed   By: Macy Mis M.D.   On: 09/30/2021 19:49   CT  HEAD WO CONTRAST (5MM)  Result Date: 09/30/2021 CLINICAL DATA:  Head trauma, minor (Age >= 65y) EXAM: CT HEAD WITHOUT CONTRAST TECHNIQUE: Contiguous axial images were obtained from the base of the skull through the vertex without intravenous contrast. RADIATION DOSE REDUCTION: This exam was performed according to the departmental dose-optimization program which includes automated exposure control, adjustment of the mA and/or kV according to patient size and/or use of iterative reconstruction technique. COMPARISON:  11/28/2020 FINDINGS: Brain: Approximately 8 x 11 x 12 mm focus of hemorrhage within the periventricular white matter along the body of the right lateral ventricle. There is significant intraventricular hemorrhage primarily within the right lateral ventricle but also dependent left lateral ventricle, third ventricle, and fourth ventricle. Caliber of the ventricles is increased reflecting hydrocephalus. Gray-white differentiation is preserved. Patchy hypoattenuation in the supratentorial white matter is nonspecific but probably reflects similar chronic microvascular ischemic changes. There is a mega cisterna magna. Vascular: There is atherosclerotic calcification at the skull base. Skull: Calvarium is unremarkable. Sinuses/Orbits: No acute finding. Other: None. IMPRESSION: 12 mm focus of acute hemorrhage within the periventricular white matter adjacent to the body of the right lateral ventricle. Intraventricular hemorrhage appears to reflect extension of this hematoma. There is resulting mild communicating hydrocephalus. These results were called by telephone at the time of interpretation on 09/30/2021 at 3:30 pm to provider Merlyn Lot , who verbally acknowledged these results. Electronically Signed   By: Macy Mis M.D.   On: 09/30/2021 15:32   CT ANGIO NECK W OR WO CONTRAST  Result Date: 10/02/2021 CLINICAL DATA:  Hemorrhagic stroke, follow-up EXAM: CT ANGIOGRAPHY HEAD AND NECK TECHNIQUE:  Multidetector CT imaging of the head and neck was performed using the standard protocol during bolus administration of intravenous contrast.  Multiplanar CT image reconstructions and MIPs were obtained to evaluate the vascular anatomy. Carotid stenosis measurements (when applicable) are obtained utilizing NASCET criteria, using the distal internal carotid diameter as the denominator. RADIATION DOSE REDUCTION: This exam was performed according to the departmental dose-optimization program which includes automated exposure control, adjustment of the mA and/or kV according to patient size and/or use of iterative reconstruction technique. CONTRAST:  74mL OMNIPAQUE IOHEXOL 350 MG/ML SOLN COMPARISON:  Brain MRI obtained earlier the same day, CT head dated 1 day prior FINDINGS: CTA NECK FINDINGS Aortic arch: There is mild calcified atherosclerotic plaque of the aortic arch. The origins of the major branch vessels are patent. The subclavian arteries are patent to the level imaged. There is a small outpouching likely reflecting a penetrating atherosclerotic ulcer along the posteromedial margin of the aortic arch [id number]). Right carotid system: The right common carotid artery is patent with mild soft and calcified plaque. There is calcified atherosclerotic plaque at the bifurcation without hemodynamically significant stenosis or occlusion. The internal and external carotid arteries are patent. There is no dissection or aneurysm. The right internal carotid artery has a partially medialized/retropharyngeal course. Left carotid system: The left common carotid artery is patent with scattered soft and calcified plaque. There is mixed plaque in the proximal left ICA resulting in less than 50% stenosis the distal left internal carotid artery is patent. The left external carotid artery is patent. Vertebral arteries: The right vertebral artery is patent at its origin. There is severe stenosis in the V1 segment primarily due to soft  plaque (9-277). There is reconstitution of flow in the distal V1 segment. There is scattered plaque throughout the remainder of the vertebral artery resulting in additional moderate to severe stenoses at the C5-C6 and C4-C5 levels (9-244, 9-241, 10-127). There is severe stenosis at the origin of the left vertebral artery (10-140). The left vertebral artery is otherwise patent. There is no evidence of dissection or aneurysm. Skeleton: There is mild multilevel degenerative change of the cervical spine. There is no acute osseous abnormality or aggressive osseous lesion. Sclerotic lesions in the right and left frontal calvarium for present in 2018. Other neck: The soft tissues are unremarkable. Upper chest: There is emphysema and scarring in the lung apices. Review of the MIP images confirms the above findings CTA HEAD FINDINGS Anterior circulation: Is calcified atherosclerotic plaque in the bilateral intracranial ICAs resulting in up to mild stenosis on the right The bilateral MCAs are patent. The bilateral ACAs are patent. The anterior communicating artery is normal. There is no aneurysm or AVM. Posterior circulation: There is mild stenosis of the right vertebral artery at the PICA origin. The right vertebral artery is otherwise patent through the basilar artery. The left vertebral artery is occluded after the PICA origin. The basilar artery is patent with mild multifocal irregularity but no high-grade stenosis or occlusion. There are prominent bilateral posterior communicating arteries which primarily supply the posterior cerebral arteries, with small P1 segments bilaterally. The bilateral PCAs are patent There is no aneurysm or AVM. Venous sinuses: Not well evaluated due to bolus timing. Anatomic variants: Above. The intraparenchymal and intraventricular hemorrhage appears similar to the prior study from 1 day prior. The ventricles are stable in size. Review of the MIP images confirms the above findings IMPRESSION:  1. No culprit aneurysm or AVM identified. 2. Multifocal stenosis of the right vertebral artery with severe stenoses at the V1 and V2 segments. 3. Occluded left V4 segment after the PICA origin, likely chronic.  Otherwise, patent intracranial vasculature with the PCAs primarily supplied by prominent posterior communicating arteries. 4. Mild calcified atherosclerotic plaque at the carotid bifurcations and intracranial ICAs without hemodynamically significant stenosis or occlusion. 5. Small probable penetrating atherosclerotic ulcer along the posteromedial margin of the aortic arch. Aortic Atherosclerosis (ICD10-I70.0) and Emphysema (ICD10-J43.9). Electronically Signed   By: Valetta Mole M.D.   On: 10/02/2021 12:00   CT Cervical Spine Wo Contrast  Result Date: 09/30/2021 CLINICAL DATA:  Neck trauma (Age >= 65y) EXAM: CT CERVICAL SPINE WITHOUT CONTRAST TECHNIQUE: Multidetector CT imaging of the cervical spine was performed without intravenous contrast. Multiplanar CT image reconstructions were also generated. RADIATION DOSE REDUCTION: This exam was performed according to the departmental dose-optimization program which includes automated exposure control, adjustment of the mA and/or kV according to patient size and/or use of iterative reconstruction technique. COMPARISON:  11/28/2020 FINDINGS: Alignment: Stable. Skull base and vertebrae: No acute fracture. Vertebral body heights are maintained. Soft tissues and spinal canal: No prevertebral fluid or swelling. No visible canal hematoma. Disc levels:  Mild multilevel degenerative changes. Upper chest: Bullous emphysema. Other: Calcified plaque at the common carotid bifurcations. IMPRESSION: No acute cervical spine fracture. Electronically Signed   By: Macy Mis M.D.   On: 09/30/2021 16:04   MR BRAIN WO CONTRAST  Result Date: 10/02/2021 CLINICAL DATA:  Hemorrhagic stroke EXAM: MRI HEAD WITHOUT CONTRAST TECHNIQUE: Multiplanar, multiecho pulse sequences of the  brain and surrounding structures were obtained without intravenous contrast. COMPARISON:  Head CT from yesterday FINDINGS: Brain: Punctate acute infarct along the high right frontal cortex. Limited assessment of diffusion adjacent to the right corona radiata and intraventricular hemorrhage given susceptibility affects. A small white matter infarct could be obscured at this level. Subacute hemorrhage with methemoglobin showing is similar distribution of prior CT, seen in the lateral ventricles to fourth ventricle with lateral ventriculomegaly. Non worrisome retro cerebellar CSF collection. FLAIR hyperintensity in the periventricular white matter at least partially from chronic small vessel ischemia with chronic lacune is at the bilateral thalamus small volume subarachnoid hemorrhage is likely along the left parietal convexity, attributed to a clearing and redistribution. Non worrisome retro cerebellar CSF collection. Vascular: No visible flow in the right vertebral or basilar arteries until the distal basilar. There appears to be fetal type bilateral PCA flow. Case discussed with Dr. Rory Percy via telephone. Skull and upper cervical spine: Normal marrow signal. Sinuses/Orbits: Negative. IMPRESSION: 1. Absent flow void in the right vertebral and and proximal basilar artery with distal reconstitution. Fetal type appearing PCAs. This may be chronic given the clinical circumstances and lack of posterior circulation ischemia. 2. Punctate acute infarct in the right frontal cortex. Limited diffusion assessment adjacent to the right corona radiata hemorrhage due to susceptibility artifact. White matter infarct could be obscured at this level. 3. Unchanged hemorrhagic volume and ventriculomegaly. Electronically Signed   By: Jorje Guild M.D.   On: 10/02/2021 04:56   DG Chest Portable 1 View  Result Date: 09/30/2021 CLINICAL DATA:  Fall out of bed today. COPD. Congestive heart failure. EXAM: PORTABLE CHEST 1 VIEW  COMPARISON:  08/15/2021 FINDINGS: Heart size is within normal limits allowing for portable technique. Decreased lung volumes are seen. Increased opacity is seen in the left lower lung along the left heart border, suspicious for infiltrate/pneumonia. No evidence of pleural effusion. IMPRESSION: Suspect left lower lobe infiltrate/pneumonia. Recommend follow-up with two-view chest radiograph when patient is able. Electronically Signed   By: Marlaine Hind M.D.   On: 09/30/2021 15:42  DG Abd Portable 1V  Result Date: 10/03/2021 CLINICAL DATA:  OG tube placement EXAM: PORTABLE ABDOMEN - 1 VIEW COMPARISON:  10/03/2021 FINDINGS: OG tube tip is in the proximal stomach. The side port is in the distal esophagus. IMPRESSION: OG tube tip in the proximal stomach. Electronically Signed   By: Rolm Baptise M.D.   On: 10/03/2021 22:27   ECHOCARDIOGRAM LIMITED  Result Date: 10/01/2021    ECHOCARDIOGRAM REPORT   Patient Name:   Danny Salz Domine Sr. Date of Exam: 10/01/2021 Medical Rec #:  [id number]          Height:       72.0 in Accession #:    [id number]         Weight:       217.4 lb Date of Birth:  Dec 27, 1950          BSA:          2.207 m Patient Age:    23 years           BP:           140/90 mmHg Patient Gender: M                  HR:           92 bpm. Exam Location:  Inpatient Procedure: Limited Echo Indications:     CVA  History:         Patient has prior history of Echocardiogram examinations, most                  recent 12/06/2020. CHF, CAD, COPD; Risk Factors:Diabetes,                  Dyslipidemia and Hypertension. 07/23/2016 cath.  Sonographer:     Luisa Hart RDCS Referring Phys:  [id number] ERIC LINDZEN Diagnosing Phys: Fransico Him MD  Sonographer Comments: Technically difficult study due to poor echo windows and patient is morbidly obese. Image acquisition challenging due to patient body habitus. IMPRESSIONS  1. Very poor images despite use of definity contrast. Left ventricular endocardial border not optimally defined  to evaluate regional wall motion. Left ventricular diastolic parameters are indeterminate.  2. Right ventricular systolic function was not well visualized. The right ventricular size is normal. Tricuspid regurgitation signal is inadequate for assessing PA pressure.  3. The mitral valve was not well visualized. No evidence of mitral valve regurgitation. No evidence of mitral stenosis.  4. The aortic valve was not well visualized. Aortic valve regurgitation is not visualized. No aortic stenosis is present. Aortic valve area, by VTI measures 3.44 cm. Aortic valve mean gradient measures 2.0 mmHg.  5. The inferior vena cava is normal in size with greater than 50% respiratory variability, suggesting right atrial pressure of 3 mmHg.  6. There is a trivial pericardial effusion anterior to the right ventricle. FINDINGS  Left Ventricle: Very poor images despite use of definity contrast. Left ventricular endocardial border not optimally defined to evaluate regional wall motion. Definity contrast agent was given IV to delineate the left ventricular endocardial borders. The left ventricular internal cavity size was normal in size. There is  no left ventricular hypertrophy. Left ventricular diastolic parameters are indeterminate. Normal left ventricular filling pressure. Right Ventricle: The right ventricular size is normal. No increase in right ventricular wall thickness. Right ventricular systolic function was not well visualized. Tricuspid regurgitation signal is inadequate for assessing PA pressure. Left Atrium: Left atrial size was not assessed. Right Atrium: Right atrial  size was not assessed. Pericardium: Trivial pericardial effusion is present. The pericardial effusion is anterior to the right ventricle. Mitral Valve: The mitral valve was not well visualized. No evidence of mitral valve regurgitation. No evidence of mitral valve stenosis. Tricuspid Valve: The tricuspid valve is normal in structure. Tricuspid valve  regurgitation is trivial. No evidence of tricuspid stenosis. Aortic Valve: The aortic valve was not well visualized. Aortic valve regurgitation is not visualized. No aortic stenosis is present. Aortic valve mean gradient measures 2.0 mmHg. Aortic valve peak gradient measures 3.3 mmHg. Aortic valve area, by VTI measures 3.44 cm. Pulmonic Valve: The pulmonic valve was not well visualized. Pulmonic valve regurgitation is not visualized. No evidence of pulmonic stenosis. Aorta: The aortic root was not well visualized. Venous: The inferior vena cava is normal in size with greater than 50% respiratory variability, suggesting right atrial pressure of 3 mmHg. IAS/Shunts: No atrial level shunt detected by color flow Doppler.  LEFT VENTRICLE PLAX 2D LVIDd:         5.20 cm   Diastology LVIDs:         3.90 cm   LV e' medial:    3.92 cm/s LV PW:         0.90 cm   LV E/e' medial:  6.1 LV IVS:        1.00 cm   LV e' lateral:   4.30 cm/s LVOT diam:     2.30 cm   LV E/e' lateral: 5.5 LV SV:         50 LV SV Index:   23 LVOT Area:     4.15 cm  LEFT ATRIUM         Index LA diam:    2.50 cm 1.13 cm/m  AORTIC VALVE                    PULMONIC VALVE AV Area (Vmax):    3.61 cm     PV Vmax:       0.74 m/s AV Area (Vmean):   3.45 cm     PV Vmean:      48.667 cm/s AV Area (VTI):     3.44 cm     PV VTI:        0.111 m AV Vmax:           90.90 cm/s   PV Peak grad:  2.2 mmHg AV Vmean:          59.000 cm/s  PV Mean grad:  1.0 mmHg AV VTI:            0.145 m AV Peak Grad:      3.3 mmHg AV Mean Grad:      2.0 mmHg LVOT Vmax:         79.00 cm/s LVOT Vmean:        49.000 cm/s LVOT VTI:          0.120 m LVOT/AV VTI ratio: 0.83  AORTA Ao Root diam: 2.70 cm MITRAL VALVE MV Area (PHT): 5.97 cm    SHUNTS MV Decel Time: 127 msec    Systemic VTI:  0.12 m MV E velocity: 23.80 cm/s  Systemic Diam: 2.30 cm MV A velocity: 59.20 cm/s MV E/A ratio:  0.40 Fransico Him MD Electronically signed by Fransico Him MD Signature Date/Time: 10/01/2021/4:55:22 PM     Final (Updated)     Labs:  CBC: Recent Labs    10/03/21 0230 10/04/21 0758 10/05/21 0517 10/06/21 0355  WBC  12.0* 10.0 9.5 8.6  HGB 16.5 16.1 15.0 15.2  HCT 49.5 49.2 46.0 47.7  PLT 258 235 211 227    COAGS: Recent Labs    11/10/20 0507 09/30/21 1700  INR 1.3* 1.1  APTT 42*  --     BMP: Recent Labs    10/03/21 0230 10/04/21 0758 10/05/21 0517 10/06/21 0355  NA 138 141 138 138  K 3.8 4.1 4.0 4.5  CL 107 111 109 106  CO2 19* 20* 22 23  GLUCOSE 108* 139* 119* 126*  BUN 39* 44* 29* 21  CALCIUM 8.8* 8.7* 8.6* 8.9  CREATININE 1.31* 1.39* 1.08 0.93  GFRNONAA 59* 55* >60 >60    LIVER FUNCTION TESTS: Recent Labs    11/09/20 0509 12/12/20 1339 08/15/21 1517 09/30/21 1055  BILITOT 0.8 0.5 0.7 0.8  AST 46* 18 20 23   ALT 60* 24 21 25   ALKPHOS 120 92 106 98  PROT 6.5 6.0* 7.2 7.2  ALBUMIN 2.4* 3.0* 3.9 4.0    TUMOR MARKERS: No results for input(s): AFPTM, CEA, CA199, CHROMGRNA in the last 8760 hours.  Assessment and Plan: 71 y.o. male with ICH, right vertebral artery stenosis, and occluded left PICA, who is in need of diagnostic cerebral angiogram for further evaluation.   RF normal on 10/06/21  INR 1.1 on 09/30/21   PLAN  The procedure is tentatively scheduled for Monday 10/09/21.  NPO Monday MN  Hold Metformin on Sunday and Monday. Can be resumed on Tuesday.  Hold Lovenox on Sunday and Monday. Can be resumed on Tuesday   Patient was not able to sign the consent due to weakness, verbal consent was received.  Daughter Erroll Luna was called and discussed the procedure via telephone.   Risks and benefits of cerebral angiogram with intervention were discussed with the patient and his daughter including, but not limited to bleeding, infection, vascular injury, contrast induced renal failure, stroke or even death.  This interventional procedure involves the use of X-rays and because of the nature of the planned procedure, it is possible that we will have  prolonged use of X-ray fluoroscopy.  Potential radiation risks to you include (but are not limited to) the following: - A slightly elevated risk for cancer  several years later in life. This risk is typically less than 0.5% percent. This risk is low in comparison to the normal incidence of human cancer, which is 33% for women and 50% for men according to the [place]. - Radiation induced injury can include skin redness, resembling a rash, tissue breakdown / ulcers and hair loss (which can be temporary or permanent).   The likelihood of either of these occurring depends on the difficulty of the procedure and whether you are sensitive to radiation due to previous procedures, disease, or genetic conditions.   IF your procedure requires a prolonged use of radiation, you will be notified and given written instructions for further action.  It is your responsibility to monitor the irradiated area for the 2 weeks following the procedure and to notify your physician if you are concerned that you have suffered a radiation induced injury.    All of the patient and his daughter questions were answered, patient is agreeable to proceed.  Consent signed and in IR.    Thank you for this interesting consult.  I greatly enjoyed meeting Danny VELTRE Sr. and look forward to participating in their care.  A copy of this report was sent to the requesting provider on  this date.  Electronically Signed: Tera Mater, PA-C 10/06/2021, 12:12 PM   I spent a total of 40 Minutes    in face to face in clinical consultation, greater than 50% of which was counseling/coordinating care for diagnostic cerebral angiogram.   This chart was dictated using voice recognition software.  Despite best efforts to proofread,  errors can occur which can change the documentation meaning.

## 2021-10-06 NOTE — Care Management Important Message (Signed)
Important Message ? ?Patient Details  ?Name: Danny Low Boomer Sr. ?MRN: [id number] ?Date of Birth: [birth year] ? ? ?Medicare Important Message Given:  Yes ? ? ? ? ?Deidra Spease ?10/06/2021, 2:12 PM ?

## 2021-10-06 NOTE — Progress Notes (Signed)
PROGRESS NOTE    Danny [name].  GHW:[id number] DOB: Jun 12, 1951 DOA: 09/30/2021 PCP: [name], MD    Brief Narrative:  [name]. is a 71 y.o. male with history of HTN, HLD, COPD presenting after being found down on the ground more confused than normal. He lives with his son who was also admitted to the hospital, so the patient has not been seen by anyone for the last couple of days.  Patient was brought to [facility], a CT scan demonstrated intracranial hemorrhage with intraventricular extension and patient was transferred to neuro ICU at [facility] for higher level of care.  Patient on aspirin and Plavix at home.  Was treated with conservative management, Cleviprex infusion with stabilization and transferred to general medical floor on 3/3.     Assessment & Plan:   Acute hemorrhagic stroke with intraventricular hemorrhage of the right lateral ventricle, secondary to hypertensive angiopathy: Clinical findings, found fallen and confused at home. CT head findings, initial CT scan with 12 mm acute hemorrhage right lateral ventricle with intraventricular extension of hematoma, mild communicating hydrocephalus, repeat CT head unchanged and is stable. MRI of the brain, absent flow rate vertebral and proximal basilar artery with distal reconstitution, punctate hemorrhage, unchanged intracranial hemorrhage. CTA of the head and neck, occluded left before, chronically stenosis right vertebral artery and severe stenosis at B1 and B2. 2D echocardiogram, no atrial level shunt. Antiplatelet therapy, on aspirin and Plavix at home.  Currently discontinued. LDL 34.  On atorvastatin at home.  Currently on hold due to acute hemorrhage.  Hemoglobin A1c 7.4. DVT prophylaxis, on Lovenox. Remains n.p.o., swallow evaluation pending today.  On maintenance IV fluids. Disposition, skilled nursing facility once able to eat and stabilize.  Essential hypertension: At home on Lasix,  Aldactone and Coreg.  Blood pressures fluctuating. Currently remains on Coreg 12.5 twice daily, Lasix 20 mg daily, isosorbide mononitrate 10 mg 3 times a day, Entresto 1 mg 2 times daily.  Labetalol as needed.  We will keep as it is for next 24 hours and potentially increase dose of nitrates gradually.  Hyperlipidemia: LDL 38, at goal.  Atorvastatin on hold due to acute hemorrhagic stroke.  Type 2 diabetes, controlled with hyperglycemia: Home medications, metformin, Jardiance and Trulicity.  Currently on sliding scale insulin.  A1c 7.4.  Fairly stable.  Will need more treatment once he is able to resume oral intake.  BPH: On Flomax.  Atherosclerosis, left BKA: Stable.  Swallow evaluation today.  Continue to work with PT OT. Interventional radiology evaluating for angiogram.  Anticipate SNF placement early next week.     DVT prophylaxis: enoxaparin (LOVENOX) injection 40 mg Start: 10/03/21 1800 SCDs Start: 10/03/21 1703 SCD's Start: 09/30/21 1847   Code Status: Full code Family Communication: None at the bedside Disposition Plan: Status is: Inpatient Remains inpatient appropriate because: N.p.o., active treatment for intracranial hemorrhage.             Consultants:  Critical care Neurology  Procedures:  None  Antimicrobials:  None   Subjective: Patient seen and examined.  No overnight events.  No more confusion or agitation.  Patient tells me he is hungry, I explained to him that he is waiting for his speech therapy procedure to look for his swallowing.  Taking medications crushed with applesauce and tolerating well.  No family at the bedside. Patient was asking me about his son, looks like his son is a still in ICU, I did not disclose him  information.  Objective: Vitals:   10/06/21 0734 10/06/21 0740 10/06/21 0830 10/06/21 1100  BP: (!) 157/104 (!) 125/110 (!) 156/100 (!) 186/107  Pulse: 84     Resp: 20     Temp: 98.5 F (36.9 C)     TempSrc: Oral      SpO2: 97%     Weight:      Height:        Intake/Output Summary (Last 24 hours) at 10/06/2021 1131 Last data filed at 10/06/2021 0000 Gross per 24 hour  Intake 509.53 ml  Output 361 ml  Net 148.53 ml   Filed Weights   09/30/21 1800 10/05/21 0619  Weight: 98.6 kg 102.7 kg    Examination:  General exam: Appears calm and comfortable  Debilitated.  Looks fairly comfortable at rest.  Alert and awake, slightly delayed response but he is able to explain situation, time and place. Respiratory system: Clear to auscultation. Respiratory effort normal.  No added sounds. Cardiovascular system: S1 & S2 heard, RRR.  Gastrointestinal system: Soft.  Nontender.   Central nervous system: Alert and oriented.  No gross neurological deficits. Extremities: Symmetric 5 x 5 power. Left BKA stump clean and dry.   Data Reviewed: I have personally reviewed following labs and imaging studies  CBC: Recent Labs  Lab 09/30/21 1055 10/03/21 0230 10/04/21 0758 10/05/21 0517 10/06/21 0355  WBC 11.0* 12.0* 10.0 9.5 8.6  HGB 16.9 16.5 16.1 15.0 15.2  HCT 53.7* 49.5 49.2 46.0 47.7  MCV 94.4 92.7 94.3 94.5 94.5  PLT 269 258 235 211 227   Basic Metabolic Panel: Recent Labs  Lab 09/30/21 1055 10/03/21 0230 10/04/21 0758 10/05/21 0517 10/06/21 0355  NA 136 138 141 138 138  K 4.1 3.8 4.1 4.0 4.5  CL 102 107 111 109 106  CO2 23 19* 20* 22 23  GLUCOSE 215* 108* 139* 119* 126*  BUN 23 39* 44* 29* 21  CREATININE 0.94 1.31* 1.39* 1.08 0.93  CALCIUM 9.3 8.8* 8.7* 8.6* 8.9   GFR: Estimated Creatinine Clearance: 91.6 mL/min (by C-G formula based on SCr of 0.93 mg/dL). Liver Function Tests: Recent Labs  Lab 09/30/21 1055  AST 23  ALT 25  ALKPHOS 98  BILITOT 0.8  PROT 7.2  ALBUMIN 4.0   No results for input(s): LIPASE, AMYLASE in the last 168 hours. No results for input(s): AMMONIA in the last 168 hours. Coagulation Profile: Recent Labs  Lab 09/30/21 1700  INR 1.1   Cardiac Enzymes: No  results for input(s): CKTOTAL, CKMB, CKMBINDEX, TROPONINI in the last 168 hours. BNP (last 3 results) No results for input(s): PROBNP in the last 8760 hours. HbA1C: No results for input(s): HGBA1C in the last 72 hours. CBG: Recent Labs  Lab 10/05/21 1133 10/05/21 1514 10/05/21 1942 10/05/21 2322 10/06/21 0347  GLUCAP 127* 120* 138* 131* 119*   Lipid Profile: No results for input(s): CHOL, HDL, LDLCALC, TRIG, CHOLHDL, LDLDIRECT in the last 72 hours. Thyroid Function Tests: No results for input(s): TSH, T4TOTAL, FREET4, T3FREE, THYROIDAB in the last 72 hours. Anemia Panel: No results for input(s): VITAMINB12, FOLATE, FERRITIN, TIBC, IRON, RETICCTPCT in the last 72 hours. Sepsis Labs: No results for input(s): PROCALCITON, LATICACIDVEN in the last 168 hours.  Recent Results (from the past 240 hour(s))  MRSA Next Gen by PCR, Nasal     Status: None   Collection Time: 09/30/21  7:25 PM   Specimen: Nasal Mucosa; Nasal Swab  Result Value Ref Range Status   MRSA by PCR  Next Gen NOT DETECTED NOT DETECTED Final    Comment: (NOTE) The GeneXpert MRSA Assay (FDA approved for NASAL specimens only), is one component of a comprehensive MRSA colonization surveillance program. It is not intended to diagnose MRSA infection nor to guide or monitor treatment for MRSA infections. Test performance is not FDA approved in patients less than 24 years old. Performed at [facility], [address] [place], [place] [address]          Radiology Studies: No results found.      Scheduled Meds:  carvedilol  12.5 mg Oral BID WC   chlorhexidine  15 mL Mouth Rinse BID   enoxaparin (LOVENOX) injection  40 mg Subcutaneous Q24H   escitalopram  10 mg Oral Daily   folic acid  1 mg Oral Daily   [START ON 10/07/2021] furosemide  20 mg Oral Daily   gabapentin  300 mg Oral QHS   insulin aspart  0-15 Units Subcutaneous Q4H   isosorbide mononitrate  10 mg Oral TID   mouth rinse  15 mL Mouth Rinse  [id number]   multivitamin with minerals  1 tablet Oral Daily   pantoprazole  40 mg Oral QHS   sacubitril-valsartan  1 tablet Oral BID   senna-docusate  1 tablet Oral BID   thiamine  100 mg Oral Daily   Continuous Infusions:  sodium chloride 75 mL/hr at 10/05/21 1700     LOS: 6 days    Time spent: 35 minutes    Dorcas Carrow, MD [facility] Pager [phone]

## 2021-10-06 NOTE — Plan of Care (Signed)
?  Problem: Education: Goal: Knowledge of secondary prevention will improve (SELECT ALL) Outcome: Progressing   Problem: Ischemic Stroke/TIA Tissue Perfusion: Goal: Complications of ischemic stroke/TIA will be minimized Outcome: Progressing   

## 2021-10-06 NOTE — Progress Notes (Signed)
Physical Therapy Treatment ?Patient Details ?Name: Danny Mucha Pilgrim Sr. ?MRN: [id number] ?DOB: 11/26/1950 ?Today's Date: 10/06/2021 ? ? ?History of Present Illness Danny CONSIGLIO Sr. is a 71 y.o. male who presented to [facility] after being found down by family. CT: positive for R IVH.  Transferred to [facility]. MRI: Punctate acute infarct in the right frontal cortex and unchanged hemorrhagic volume and ventriculomegaly. PHMx: hypertension, COPD, DM, CAD CHF, PAD s/p L BKA 11/2020, anddementia. ? ?  ?PT Comments  ? ? Slow to progress toward goals.  Emphasis today on transitions to/from EOB, sitting balance at EOB, sit to stand x2 at EOB without prosthesis for L LE. ?   ?Recommendations for follow up therapy are one component of a multi-disciplinary discharge planning process, led by the attending physician.  Recommendations may be updated based on patient status, additional functional criteria and insurance authorization. ? ?Follow Up Recommendations ? Skilled nursing-short term rehab (<3 hours/day) ?  ?  ?Assistance Recommended at Discharge Frequent or constant Supervision/Assistance  ?Patient can return home with the following Two people to help with walking and/or transfers;Two people to help with bathing/dressing/bathroom;Direct supervision/assist for medications management;Help with stairs or ramp for entrance;Assist for transportation;Assistance with feeding;Assistance with cooking/housework;Direct supervision/assist for financial management ?  ?Equipment Recommendations ? Other (comment) (TBD)  ?  ?Recommendations for Other Services   ? ? ?  ?Precautions / Restrictions Precautions ?Precautions: Fall ?Restrictions ?Weight Bearing Restrictions: No ?Other Position/Activity Restrictions: Prosthesis is at home  ?  ? ?Mobility ? Bed Mobility ?Overal bed mobility: Needs Assistance ?Bed Mobility: Supine to Sit, Sit to Supine ?  ?  ?Supine to sit: Max assist ?Sit to supine: Max assist, +2 for physical  assistance ?  ?General bed mobility comments: patient required cues to use rail to assist. ?  ? ?Transfers ?Overall transfer level: Needs assistance ?Equipment used: 2 person hand held assist ?Transfers: Sit to/from Stand ?Sit to Stand: Max assist, +2 physical assistance ?  ?  ?  ?  ?  ?General transfer comment: stood x2 from EOB with max assist on RLE due to no prosthesis for left ?  ? ?Ambulation/Gait ?  ?  ?  ?  ?  ?  ?  ?  ? ? ?Stairs ?  ?  ?  ?  ?  ? ? ?Wheelchair Mobility ?  ? ?Modified Rankin (Stroke Patients Only) ?  ? ? ?  ?Balance Overall balance assessment: Needs assistance ?Sitting-balance support: Bilateral upper extremity supported, Feet supported ?Sitting balance-[name]: Fair ?Sitting balance - Comments: posterior and left lateral leaning requiring max assist. Was able t maintain balance for 1+ minute following lateral leaning and trunk flexion exercises ?Postural control: Left lateral lean, Posterior lean ?Standing balance support: Bilateral upper extremity supported ?Standing balance-[name]: Poor ?Standing balance comment: max assist +2 to stand from EOB ?  ?  ?  ?  ?  ?  ?  ?  ?  ?  ?  ?  ? ?  ?Cognition Arousal/Alertness: Awake/alert ?Behavior During Therapy: Flat affect ?Overall Cognitive Status: History of cognitive impairments - at baseline ?  ?  ?  ?  ?  ?  ?  ?  ?  ?  ?  ?  ?  ?  ?  ?  ?General Comments: alert and required increased time to follow directions ?  ?  ? ?  ?Exercises   ? ?  ?General Comments   ?  ?  ? ?Pertinent  Vitals/Pain Pain Assessment ?Pain Assessment: Faces ?Faces Pain Scale: Hurts little more ?Pain Location: headache ?Pain Descriptors / Indicators: Headache ?Pain Intervention(s): Monitored during session  ? ? ?Home Living   ?  ?  ?  ?  ?  ?  ?  ?  ?  ?   ?  ?Prior Function    ?  ?  ?   ? ?PT Goals (current goals can now be found in the care plan section) Acute Rehab PT Goals ?PT Goal Formulation: With patient ?Time For Goal Achievement: 10/17/21 ?Potential to  Achieve Goals: Fair ?Progress towards PT goals: Progressing toward goals ? ?  ?Frequency ? ? ? Min 3X/week ? ? ? ?  ?PT Plan Current plan remains appropriate  ? ? ?Co-evaluation PT/OT/SLP Co-Evaluation/Treatment: Yes ?Reason for Co-Treatment: For patient/therapist safety ?PT goals addressed during session: Mobility/safety with mobility ?OT goals addressed during session: Strengthening/ROM ?  ? ?  ?AM-PAC PT "6 Clicks" Mobility   ?Outcome Measure ? Help needed turning from your back to your side while in a flat bed without using bedrails?: Total ?Help needed moving from lying on your back to sitting on the side of a flat bed without using bedrails?: Total ?Help needed moving to and from a bed to a chair (including a wheelchair)?: Total ?Help needed standing up from a chair using your arms (e.g., wheelchair or bedside chair)?: Total ?Help needed to walk in hospital room?: Total ?Help needed climbing 3-5 steps with a railing? : Total ?6 Click Score: 6 ? ?  ?End of Session Equipment Utilized During Treatment: Gait belt ?Activity Tolerance: Patient tolerated treatment well;Patient limited by fatigue ?Patient left: in bed;with call bell/phone within reach;with bed alarm set ?Nurse Communication: Mobility status ?PT Visit Diagnosis: Other symptoms and signs involving the nervous system (R29.898);Muscle weakness (generalized) (M62.81);History of falling (Z91.81) ?  ? ? ?Time: 7078-6754 ?PT Time Calculation (min) (ACUTE ONLY): 29 min ? ?Charges:  $Therapeutic Activity: 8-22 mins          ?          ? ?10/06/2021 ? ?Danny Halim., PT ?Acute Rehabilitation Services ?[phone]  (pager) ?[phone]  (office) ? ? ?Danny [name] ?10/06/2021, 4:26 PM ? ?

## 2021-10-06 NOTE — Progress Notes (Signed)
Speech Language Pathology Treatment: Dysphagia  ?Patient Details ?Name: Mildred Bollard Salminen Sr. ?MRN: [id number] ?DOB: 08-Feb-1951 ?Today's Date: 10/06/2021 ?Time: 4174-0814 ?SLP Time Calculation (min) (ACUTE ONLY): 15 min ? ?Assessment / Plan / Recommendation ?Clinical Impression ? The patient was seen at bedside for dysphagia treatment. Patient appeared more alert and attentive during PO trials since the last targeted session (10/05/21). Patient displayed delayed cough with consecutive sips of thin liquid via straw cup. Patient's vocal quality was clear, with moderately reduced overall intelligibility 2/2 dysarthria. Patient adequately accepted medications crushed with puree from RN with no overt s/sx of aspiration noted. SLP to conduct MBS today at 1330. Continue to initiate NPO orders with medications crushed in puree.  ?HPI HPI: Patient is a 71 y.o. male with PMH: HTN, HLD, dementia, COPD, HLD, CAD, DM-2. Typically he has supervision and assistance from his son, but unfortunately his son was admitted and continues to be admitted at hospital after suffering intracranial hemorrhage. Patient admitted to hospital after a family member who was checking on him found him down on the ground and more confused than usual. Family member brought him to [facility] for evaluation and CT head revealed ICH with intraventricular extension. Patient was then transferred to [facility]. ?  ?   ?SLP Plan ? MBS ? ?  ?  ?Recommendations for follow up therapy are one component of a multi-disciplinary discharge planning process, led by the attending physician.  Recommendations may be updated based on patient status, additional functional criteria and insurance authorization. ?  ? ?Recommendations  ?Diet recommendations: NPO ?Medication Administration: Crushed with puree ?Supervision: Full supervision/cueing for compensatory strategies ?Compensations: Slow rate;Small sips/bites ?Postural Changes and/or Swallow Maneuvers: Seated upright 90 degrees  ?   ?    ?    ? ? ? ? Oral Care Recommendations: Oral care BID ?Follow Up Recommendations: Skilled nursing-short term rehab (<3 hours/day) ?Assistance recommended at discharge: Frequent or constant Supervision/Assistance ?SLP Visit Diagnosis: Dysphagia, unspecified (R13.10) ?Plan: MBS ? ? ? ? ?  ?  ? ? ?Ezekiel Slocumb ? ?10/06/2021, 9:23 AM ?

## 2021-10-07 ENCOUNTER — Inpatient Hospital Stay (HOSPITAL_COMMUNITY): Payer: Medicare HMO

## 2021-10-07 DIAGNOSIS — I10 Essential (primary) hypertension: Secondary | ICD-10-CM

## 2021-10-07 DIAGNOSIS — I615 Nontraumatic intracerebral hemorrhage, intraventricular: Principal | ICD-10-CM

## 2021-10-07 LAB — CBC
HCT: 45.5 % (ref 39.0–52.0)
Hemoglobin: 15 g/dL (ref 13.0–17.0)
MCH: 30.7 pg (ref 26.0–34.0)
MCHC: 33 g/dL (ref 30.0–36.0)
MCV: 93 fL (ref 80.0–100.0)
Platelets: 243 10*3/uL (ref 150–400)
RBC: 4.89 MIL/uL (ref 4.22–5.81)
RDW: 13.2 % (ref 11.5–15.5)
WBC: 9.2 10*3/uL (ref 4.0–10.5)
nRBC: 0 % (ref 0.0–0.2)

## 2021-10-07 LAB — GLUCOSE, CAPILLARY
Glucose-Capillary: 154 mg/dL — ABNORMAL HIGH (ref 70–99)
Glucose-Capillary: 156 mg/dL — ABNORMAL HIGH (ref 70–99)
Glucose-Capillary: 164 mg/dL — ABNORMAL HIGH (ref 70–99)
Glucose-Capillary: 172 mg/dL — ABNORMAL HIGH (ref 70–99)
Glucose-Capillary: 191 mg/dL — ABNORMAL HIGH (ref 70–99)
Glucose-Capillary: 339 mg/dL — ABNORMAL HIGH (ref 70–99)

## 2021-10-07 LAB — BASIC METABOLIC PANEL
Anion gap: 8 (ref 5–15)
BUN: 25 mg/dL — ABNORMAL HIGH (ref 8–23)
CO2: 25 mmol/L (ref 22–32)
Calcium: 8.9 mg/dL (ref 8.9–10.3)
Chloride: 104 mmol/L (ref 98–111)
Creatinine, Ser: 1.07 mg/dL (ref 0.61–1.24)
GFR, Estimated: >60 mL/min (ref >60)
Glucose, Bld: 144 mg/dL — ABNORMAL HIGH (ref 70–99)
Potassium: 4 mmol/L (ref 3.5–5.1)
Sodium: 137 mmol/L (ref 135–145)

## 2021-10-07 MED ORDER — BUTALBITAL-APAP-CAFFEINE 50-325-40 MG PO TABS
1.0000 | ORAL_TABLET | Freq: Three times a day (TID) | ORAL | Status: DC | PRN
  Administered 2021-10-07 – 2021-10-10 (×6): 1 via ORAL
  Filled 2021-10-07 (×6): qty 1

## 2021-10-07 MED ORDER — TRAMADOL HCL 50 MG PO TABS
50.0000 mg | ORAL_TABLET | Freq: Four times a day (QID) | ORAL | Status: DC | PRN
  Administered 2021-10-07 – 2021-10-08 (×3): 50 mg via ORAL
  Filled 2021-10-07 (×3): qty 1

## 2021-10-07 NOTE — Progress Notes (Signed)
PROGRESS NOTE    Danny [name].  [id number] DOB: [birth year] DOA: 09/30/2021 PCP: [name], MD    Brief Narrative:  Mr. Danny Gerdes Degraaf [name]. is a 71 y.o. male with history of HTN, HLD, COPD presenting after being found down on the ground more confused than normal. He lives with his son who was also admitted to the hospital, so the patient has not been seen by anyone for the last couple of days.  Patient was brought to [facility], a CT scan demonstrated intracranial hemorrhage with intraventricular extension and patient was transferred to neuro ICU at [facility] for higher level of care.  Patient on aspirin and Plavix at home.  Was treated with conservative management, Cleviprex infusion with stabilization and transferred to general medical floor on 3/3.     Assessment & Plan:   Acute hemorrhagic stroke with intraventricular hemorrhage of the right lateral ventricle, secondary to hypertensive angiopathy: Clinical findings, found fallen and confused at home. CT head findings, initial CT scan with 12 mm acute hemorrhage right lateral ventricle with intraventricular extension of hematoma, mild communicating hydrocephalus, repeat CT head unchanged and is stable. MRI of the brain, absent flow rate vertebral and proximal basilar artery with distal reconstitution, punctate hemorrhage, unchanged intracranial hemorrhage. CTA of the head and neck, occluded left before, chronically stenosis right vertebral artery and severe stenosis at B1 and B2. 2D echocardiogram, no atrial level shunt. Antiplatelet therapy, on aspirin and Plavix at home.  Currently discontinued. LDL 34.  On atorvastatin at home.  Currently on hold due to acute hemorrhage.  Hemoglobin A1c 7.4. DVT prophylaxis, on Lovenox. Disposition, skilled nursing facility once able to eat and stabilize.  Essential hypertension: At home on Lasix, Aldactone and Coreg.  Blood pressures fluctuating. Coreg, increased 45 mg twice  daily, Lasix 20 mg daily, isosorbide mononitrate 10 mg 3 times a day, Entresto twice daily. Added amlodipine 5 mg daily 3/3.  Labetalol as needed. We will keep as it is for next 24 hours and potentially increase dose of nitrates gradually.  Hyperlipidemia: LDL 38, at goal.  Atorvastatin on hold due to acute hemorrhagic stroke.  Type 2 diabetes, controlled with hyperglycemia: Home medications, metformin, Jardiance and Trulicity.  Currently on sliding scale insulin.  A1c 7.4.  Fairly stable.  Will need more treatment once he is able to resume enough oral intake.  BPH: On Flomax.  Atherosclerosis, left BKA: Stable.  Continue to work with PT OT.  Is scheduled for cerebral angiogram on 3/6.  Discharge to skilled nursing facility after the procedure.   DVT prophylaxis: heparin injection 5,000 Units Start: 10/07/21 1400 SCDs Start: 10/03/21 1703 SCD's Start: 09/30/21 1847   Code Status: Full code Family Communication: None at the bedside Disposition Plan: Status is: Inpatient Remains inpatient appropriate because: Inpatient procedures planned.  Unsafe disposition.             Consultants:  Critical care Neurology Interventional radiology.  Procedures:  None  Antimicrobials:  None   Subjective:  Patient seen and examined.  Denies any complaints.  He is worried about his son.  Objective: Vitals:   10/06/21 2100 10/06/21 2341 10/07/21 0334 10/07/21 0757  BP: (!) 150/95 (!) 168/88 (!) 150/85 (!) 144/83  Pulse: 84 87 78 75  Resp:  17 19 20   Temp:  98.9 F (37.2 C) 99.6 F (37.6 C) 98.4 F (36.9 C)  TempSrc:  Oral Oral Oral  SpO2:  94% 95% 98%  Weight:  Height:        Intake/Output Summary (Last 24 hours) at 10/07/2021 1102 Last data filed at 10/07/2021 0900 Gross per 24 hour  Intake 620 ml  Output 775 ml  Net -155 ml   Filed Weights   09/30/21 1800 10/05/21 0619  Weight: 98.6 kg 102.7 kg    Examination:  General: Sick looking and debilitated but  not in any distress.  Looks comfortable on room air. Cardiovascular: S1-S2 normal.  Regular rate rhythm. Respiratory: Bilateral clear.  No added sounds. Gastrointestinal: Soft.  Nontender.  Bowel sound present. Ext: No cyanosis or edema.  No clubbing.  Left BKA Neuro: No gross neurological deficit. Musculoskeletal: No deformities. Skin: Intact.     Data Reviewed: I have personally reviewed following labs and imaging studies  CBC: Recent Labs  Lab 10/03/21 0230 10/04/21 0758 10/05/21 0517 10/06/21 0355 10/07/21 0048  WBC 12.0* 10.0 9.5 8.6 9.2  HGB 16.5 16.1 15.0 15.2 15.0  HCT 49.5 49.2 46.0 47.7 45.5  MCV 92.7 94.3 94.5 94.5 93.0  PLT 258 235 211 227 0000000   Basic Metabolic Panel: Recent Labs  Lab 10/03/21 0230 10/04/21 0758 10/05/21 0517 10/06/21 0355 10/07/21 0048  NA 138 141 138 138 137  K 3.8 4.1 4.0 4.5 4.0  CL 107 111 109 106 104  CO2 19* 20* 22 23 25   GLUCOSE 108* 139* 119* 126* 144*  BUN 39* 44* 29* 21 25*  CREATININE 1.31* 1.39* 1.08 0.93 1.07  CALCIUM 8.8* 8.7* 8.6* 8.9 8.9   GFR: Estimated Creatinine Clearance: 79.6 mL/min (by C-G formula based on SCr of 1.07 mg/dL). Liver Function Tests: No results for input(s): AST, ALT, ALKPHOS, BILITOT, PROT, ALBUMIN in the last 168 hours.  No results for input(s): LIPASE, AMYLASE in the last 168 hours. No results for input(s): AMMONIA in the last 168 hours. Coagulation Profile: Recent Labs  Lab 09/30/21 1700  INR 1.1   Cardiac Enzymes: No results for input(s): CKTOTAL, CKMB, CKMBINDEX, TROPONINI in the last 168 hours. BNP (last 3 results) No results for input(s): PROBNP in the last 8760 hours. HbA1C: No results for input(s): HGBA1C in the last 72 hours. CBG: Recent Labs  Lab 10/06/21 1601 10/06/21 1948 10/06/21 2344 10/07/21 0336 10/07/21 0758  GLUCAP 159* 240* 152* 172* 154*   Lipid Profile: No results for input(s): CHOL, HDL, LDLCALC, TRIG, CHOLHDL, LDLDIRECT in the last 72 hours. Thyroid  Function Tests: No results for input(s): TSH, T4TOTAL, FREET4, T3FREE, THYROIDAB in the last 72 hours. Anemia Panel: No results for input(s): VITAMINB12, FOLATE, FERRITIN, TIBC, IRON, RETICCTPCT in the last 72 hours. Sepsis Labs: No results for input(s): PROCALCITON, LATICACIDVEN in the last 168 hours.  Recent Results (from the past 240 hour(s))  MRSA Next Gen by PCR, Nasal     Status: None   Collection Time: 09/30/21  7:25 PM   Specimen: Nasal Mucosa; Nasal Swab  Result Value Ref Range Status   MRSA by PCR Next Gen NOT DETECTED NOT DETECTED Final    Comment: (NOTE) The GeneXpert MRSA Assay (FDA approved for NASAL specimens only), is one component of a comprehensive MRSA colonization surveillance program. It is not intended to diagnose MRSA infection nor to guide or monitor treatment for MRSA infections. Test performance is not FDA approved in patients less than 32 years old. Performed at [facility], [place] [address], [place], [place] [address]          Radiology Studies: DG Swallowing Func-Speech Pathology  Result Date: 10/06/2021  Table formatting from the original result was not included. Objective Swallowing Evaluation: Type of Study: MBS-Modified Barium Swallow Study  Completed by Vaughan Sine, SLP Student Supervised and reviewed by Herbie Baltimore MA CCC-SLP Patient Details Name: Danny Estimable Malay [name]. MRN: [id number] Date of Birth: January 21, 1951 Today's Date: 10/06/2021 Time: SLP Start Time (ACUTE ONLY): 29 -SLP Stop Time (ACUTE ONLY): 1355 SLP Time Calculation (min) (ACUTE ONLY): 20 min Past Medical History: Past Medical History: Diagnosis Date  CAD (coronary artery disease)   a. 01/2010 PCI of LAD; b. 09/2014 PCI/DES of mLAD due to ISR. D1 80, D2 80(jailed), LCX 68m; c. 07/2016 NSTEMI/Cath: LM nl, LAD 23m ISR, 40d, D1 90ost, 80p, D2 90ost, RI min irregs, LCX min irregs, OM1 90 small, OM2/3 min irregs, RCA min irregs, RPLB1 90, EF 25-35%.  Chronic combined systolic and diastolic CHF  (congestive heart failure) ([place])   a. 07/2016 Echo: EF 30%, severe septal/anterior HK, Gr1 DD.  CKD (chronic kidney disease), stage III (HCC)   COPD (chronic obstructive pulmonary disease) (HCC)   DM2 (diabetes mellitus, type 2) (HCC)   Erectile dysfunction   HLD (hyperlipidemia)   HTN (hypertension)   Ischemic cardiomyopathy   a. 07/2016 Echo: EF 30% w/ sev septal/ant HK. Gr1 DD. Past Surgical History: Past Surgical History: Procedure Laterality Date  AMPUTATION Left 11/10/2020  Procedure: AMPUTATION BELOW KNEE;  Surgeon: [name], MD;  Location: [facility] ORS;  Service: General;  Laterality: Left;  BELOW KNEE LEG AMPUTATION    CAD: stent to the LAD    CARDIAC CATHETERIZATION  10/01/2014  CARDIAC CATHETERIZATION N/A 07/23/2016  Procedure: Left Heart Cath and Coronary Angiography;  Surgeon: [name], MD;  Location: [place] [facility];  Service: Cardiovascular;  Laterality: N/A;  CORONARY ANGIOPLASTY WITH STENT PLACEMENT  10/01/2014  LOWER EXTREMITY ANGIOGRAPHY Left 10/31/2020  Procedure: LOWER EXTREMITY ANGIOGRAPHY;  Surgeon: [name], MD;  Location: [place] [facility];  Service: Cardiovascular;  Laterality: Left; HPI: Patient is a 71 y.o. male with PMH: HTN, HLD, dementia, COPD, HLD, CAD, DM-2. Typically he has supervision and assistance from his son, but unfortunately his son was admitted and continues to be admitted at hospital after suffering intracranial hemorrhage. Patient admitted to hospital after a family member who was checking on him found him down on the ground and more confused than usual. Family member brought him to [facility] for evaluation and CT head revealed [place] with intraventricular extension. Patient was then transferred to [facility].  Subjective: pleasant, alert, asking about his phone wanting to call his son  Recommendations for follow up therapy are one component of a multi-disciplinary discharge planning process, led by the attending physician.  Recommendations may be updated based on patient  status, additional functional criteria and insurance authorization. Assessment / Plan / Recommendation Clinical Impressions 10/06/2021 Clinical Impression Patient was seen for MBS. Pt demonstrates a mild to moderate primary oropharyngeal dysphagia with a moderate aspiration risk. Patient has baseline cognitive-linguistic deficits leading to further aspiration concerns. Oral phase of swallowing impaired as evidenced by lingual residue, delayed oral transit, tongue thrusting, and reduced tongue to palate contact. Mastication slightly prolonged possibly 2/2 patient's inadequate dentition and not having dentures. Pharyngeal swallow initiation occurred at the level of the valleculae, which is WNL. Adequate laryngeal elevation and anterior movement observed. Epiglottic deflection was efficienct. Patient penetrated with thin liquids via straw cup. However, he spontaneously and independently cleared penetrates. Residuals observed in the valleculae and posterior pharynx; pt independently and spontaneously cleared residuals with  subsequent swallow. With mixed consistency of pill and thin liquid, patient aspirated thin liquid and was unable to clear aspirate despite efforts. Patient's aspiration is a result of piecemeal swallow sequence and delayed swallow initiation to the level of the pyriforms. SLP recommends initiating a Dys 3 Diet with Thin Liquids. Medications should be whole in puree at this time. In future sessions, treatment should focus on slow rate with small bites/sips facilitated to patient to aid in timing of pharyngeal swallow sequence. SLP Visit Diagnosis Dysphagia, oropharyngeal phase (R13.12) Attention and concentration deficit following -- Frontal lobe and executive function deficit following -- Impact on safety and function Moderate aspiration risk   Treatment Recommendations 10/06/2021 Treatment Recommendations Therapy as outlined in treatment plan below   Prognosis 10/06/2021 Prognosis for Safe Diet Advancement  Good Barriers to Reach Goals Cognitive deficits Barriers/Prognosis Comment -- Diet Recommendations 10/06/2021 SLP Diet Recommendations Dysphagia 3 (Mech soft) solids;Thin liquid Liquid Administration via Straw Medication Administration Crushed with puree Compensations Minimize environmental distractions;Slow rate;Small sips/bites Postural Changes Seated upright at 90 degrees   Other Recommendations 10/06/2021 Recommended Consults -- Oral Care Recommendations Oral care BID Other Recommendations -- Follow Up Recommendations Skilled nursing-short term rehab (<3 hours/day) Assistance recommended at discharge Frequent or constant Supervision/Assistance Functional Status Assessment Patient has had a recent decline in their functional status and demonstrates the ability to make significant improvements in function in a reasonable and predictable amount of time. Frequency and Duration  10/06/2021 Speech Therapy Frequency (ACUTE ONLY) min 1 x/week Treatment Duration 2 weeks   Oral Phase 10/06/2021 Oral Phase Impaired Oral - Pudding Teaspoon -- Oral - Pudding Cup -- Oral - Honey Teaspoon -- Oral - Honey Cup -- Oral - Nectar Teaspoon -- Oral - Nectar Cup -- Oral - Nectar Straw -- Oral - Thin Teaspoon NT Oral - Thin Cup NT Oral - Thin Straw Lingual/palatal residue Oral - Puree Lingual pumping;Incomplete tongue to palate contact;Lingual/palatal residue Oral - Mech Soft NT Oral - Regular Lingual pumping;Lingual/palatal residue;Incomplete tongue to palate contact Oral - Multi-Consistency NT Oral - Pill Lingual/palatal residue Oral Phase - Comment --  Pharyngeal Phase 10/06/2021 Pharyngeal Phase Impaired Pharyngeal- Pudding Teaspoon -- Pharyngeal -- Pharyngeal- Pudding Cup -- Pharyngeal -- Pharyngeal- Honey Teaspoon -- Pharyngeal -- Pharyngeal- Honey Cup -- Pharyngeal -- Pharyngeal- Nectar Teaspoon -- Pharyngeal -- Pharyngeal- Nectar Cup -- Pharyngeal -- Pharyngeal- Nectar Straw -- Pharyngeal -- Pharyngeal- Thin Teaspoon NT Pharyngeal --  Pharyngeal- Thin Cup NT Pharyngeal -- Pharyngeal- Thin Straw Delayed swallow initiation-vallecula;Pharyngeal residue - valleculae;Pharyngeal residue - posterior pharnyx;Reduced tongue base retraction Pharyngeal Material enters airway, remains ABOVE vocal cords then ejected out Pharyngeal- Puree Delayed swallow initiation-vallecula;Reduced tongue base retraction;Pharyngeal residue - valleculae;Pharyngeal residue - posterior pharnyx Pharyngeal Material does not enter airway Pharyngeal- Mechanical Soft NT Pharyngeal -- Pharyngeal- Regular Delayed swallow initiation-vallecula;Reduced tongue base retraction;Pharyngeal residue - valleculae;Pharyngeal residue - posterior pharnyx Pharyngeal Material does not enter airway Pharyngeal- Multi-consistency NT Pharyngeal -- Pharyngeal- Pill Delayed swallow initiation-vallecula;Penetration/Aspiration before swallow;Penetration/Aspiration during swallow;Moderate aspiration;Pharyngeal residue - valleculae;Pharyngeal residue - posterior pharnyx Pharyngeal Material enters airway, passes BELOW cords and not ejected out despite cough attempt by patient Pharyngeal Comment --  No flowsheet data found. Herbie Baltimore, MA CCC-SLP Acute Rehabilitation Services Pager [phone] Office [phone] Lynann Beaver 10/06/2021, 2:32 PM                          Scheduled Meds:  amLODipine  5 mg Oral Daily   carvedilol  25 mg  Oral BID WC   chlorhexidine  15 mL Mouth Rinse BID   escitalopram  10 mg Oral Daily   folic acid  1 mg Oral Daily   furosemide  20 mg Oral Daily   gabapentin  300 mg Oral QHS   heparin injection (subcutaneous)  5,000 Units Subcutaneous Q8H   insulin aspart  0-15 Units Subcutaneous Q4H   isosorbide mononitrate  10 mg Oral TID   mouth rinse  15 mL Mouth Rinse q12n4p   multivitamin with minerals  1 tablet Oral Daily   pantoprazole  40 mg Oral QHS   sacubitril-valsartan  1 tablet Oral BID   senna-docusate  1 tablet Oral BID   thiamine  100 mg Oral  Daily   Continuous Infusions:  [START ON 10/09/2021]  ceFAZolin (ANCEF) IV       LOS: 7 days    Time spent: 35 minutes    Barb Merino, MD [facility] Pager [phone]

## 2021-10-07 NOTE — Progress Notes (Addendum)
STROKE TEAM PROGRESS NOTE   INTERVAL HISTORY Patient is seen in his room with no family at the bedside He remains drowsy and is oriented to self only.  He has been hemodynamically stable and his neurological exam is stable.  Repeat CT head ordered for today.   Vitals:   10/06/21 2341 10/07/21 0334 10/07/21 0757 10/07/21 1106  BP: (!) 168/88 (!) 150/85 (!) 144/83 (!) 143/82  Pulse: 87 78 75 78  Resp: 17 19 20 20   Temp: 98.9 F (37.2 C) 99.6 F (37.6 C) 98.4 F (36.9 C) 97.9 F (36.6 C)  TempSrc: Oral Oral Oral Oral  SpO2: 94% 95% 98% 96%  Weight:      Height:       CBC:  Recent Labs  Lab 10/06/21 0355 10/07/21 0048  WBC 8.6 9.2  HGB 15.2 15.0  HCT 47.7 45.5  MCV 94.5 93.0  PLT 227 0000000    Basic Metabolic Panel:  Recent Labs  Lab 10/06/21 0355 10/07/21 0048  NA 138 137  K 4.5 4.0  CL 106 104  CO2 23 25  GLUCOSE 126* 144*  BUN 21 25*  CREATININE 0.93 1.07  CALCIUM 8.9 8.9    Lipid Panel:  Recent Labs  Lab 10/03/21 1018  CHOL 112  TRIG 147  HDL 44  CHOLHDL 2.5  VLDL 29  LDLCALC 39    HgbA1c:  Recent Labs  Lab 10/03/21 1018  HGBA1C 7.3*    Urine Drug Screen: No results for input(s): LABOPIA, COCAINSCRNUR, LABBENZ, AMPHETMU, THCU, LABBARB in the last 168 hours.  Alcohol Level No results for input(s): ETH in the last 168 hours.  IMAGING past 24 hours DG Swallowing Func-Speech Pathology  Result Date: 10/06/2021 Table formatting from the original result was not included. Objective Swallowing Evaluation: Type of Study: MBS-Modified Barium Swallow Study  Completed by Vaughan Sine, SLP Student Supervised and reviewed by Herbie Baltimore MA CCC-SLP Patient Details Name: Danny Estimable Axelson Sr. MRN: [id number] Date of Birth: 1951-02-05 Today's Date: 10/06/2021 Time: SLP Start Time (ACUTE ONLY): 61 -SLP Stop Time (ACUTE ONLY): 1355 SLP Time Calculation (min) (ACUTE ONLY): 20 min Past Medical History: Past Medical History: Diagnosis Date  CAD (coronary artery disease)    a. 01/2010 PCI of LAD; b. 09/2014 PCI/DES of mLAD due to ISR. D1 80, D2 80(jailed), LCX 68m; c. 07/2016 NSTEMI/Cath: LM nl, LAD 45m ISR, 40d, D1 90ost, 80p, D2 90ost, RI min irregs, LCX min irregs, OM1 90 small, OM2/3 min irregs, RCA min irregs, RPLB1 90, EF 25-35%.  Chronic combined systolic and diastolic CHF (congestive heart failure) ([place])   a. 07/2016 Echo: EF 30%, severe septal/anterior HK, Gr1 DD.  CKD (chronic kidney disease), stage III (HCC)   COPD (chronic obstructive pulmonary disease) (HCC)   DM2 (diabetes mellitus, type 2) (HCC)   Erectile dysfunction   HLD (hyperlipidemia)   HTN (hypertension)   Ischemic cardiomyopathy   a. 07/2016 Echo: EF 30% w/ sev septal/ant HK. Gr1 DD. Past Surgical History: Past Surgical History: Procedure Laterality Date  AMPUTATION Left 11/10/2020  Procedure: AMPUTATION BELOW KNEE;  Surgeon: [name], MD;  Location: [facility] ORS;  Service: General;  Laterality: Left;  BELOW KNEE LEG AMPUTATION    CAD: stent to the LAD    CARDIAC CATHETERIZATION  10/01/2014  CARDIAC CATHETERIZATION N/A 07/23/2016  Procedure: Left Heart Cath and Coronary Angiography;  Surgeon: [name], MD;  Location: [place] [facility];  Service: Cardiovascular;  Laterality: N/A;  CORONARY ANGIOPLASTY WITH STENT  PLACEMENT  10/01/2014  LOWER EXTREMITY ANGIOGRAPHY Left 10/31/2020  Procedure: LOWER EXTREMITY ANGIOGRAPHY;  Surgeon: [name], MD;  Location: [place] [facility];  Service: Cardiovascular;  Laterality: Left; HPI: Patient is a 71 y.o. male with PMH: HTN, HLD, dementia, COPD, HLD, CAD, DM-2. Typically he has supervision and assistance from his son, but unfortunately his son was admitted and continues to be admitted at hospital after suffering intracranial hemorrhage. Patient admitted to hospital after a family member who was checking on him found him down on the ground and more confused than usual. Family member brought him to [facility] for evaluation and CT head revealed [place] with intraventricular  extension. Patient was then transferred to [facility].  Subjective: pleasant, alert, asking about his phone wanting to call his son  Recommendations for follow up therapy are one component of a multi-disciplinary discharge planning process, led by the attending physician.  Recommendations may be updated based on patient status, additional functional criteria and insurance authorization. Assessment / Plan / Recommendation Clinical Impressions 10/06/2021 Clinical Impression Patient was seen for MBS. Pt demonstrates a mild to moderate primary oropharyngeal dysphagia with a moderate aspiration risk. Patient has baseline cognitive-linguistic deficits leading to further aspiration concerns. Oral phase of swallowing impaired as evidenced by lingual residue, delayed oral transit, tongue thrusting, and reduced tongue to palate contact. Mastication slightly prolonged possibly 2/2 patient's inadequate dentition and not having dentures. Pharyngeal swallow initiation occurred at the level of the valleculae, which is WNL. Adequate laryngeal elevation and anterior movement observed. Epiglottic deflection was efficienct. Patient penetrated with thin liquids via straw cup. However, he spontaneously and independently cleared penetrates. Residuals observed in the valleculae and posterior pharynx; pt independently and spontaneously cleared residuals with subsequent swallow. With mixed consistency of pill and thin liquid, patient aspirated thin liquid and was unable to clear aspirate despite efforts. Patient's aspiration is a result of piecemeal swallow sequence and delayed swallow initiation to the level of the pyriforms. SLP recommends initiating a Dys 3 Diet with Thin Liquids. Medications should be whole in puree at this time. In future sessions, treatment should focus on slow rate with small bites/sips facilitated to patient to aid in timing of pharyngeal swallow sequence. SLP Visit Diagnosis Dysphagia, oropharyngeal phase (R13.12) Attention  and concentration deficit following -- Frontal lobe and executive function deficit following -- Impact on safety and function Moderate aspiration risk   Treatment Recommendations 10/06/2021 Treatment Recommendations Therapy as outlined in treatment plan below   Prognosis 10/06/2021 Prognosis for Safe Diet Advancement Good Barriers to Reach Goals Cognitive deficits Barriers/Prognosis Comment -- Diet Recommendations 10/06/2021 SLP Diet Recommendations Dysphagia 3 (Mech soft) solids;Thin liquid Liquid Administration via Straw Medication Administration Crushed with puree Compensations Minimize environmental distractions;Slow rate;Small sips/bites Postural Changes Seated upright at 90 degrees   Other Recommendations 10/06/2021 Recommended Consults -- Oral Care Recommendations Oral care BID Other Recommendations -- Follow Up Recommendations Skilled nursing-short term rehab (<3 hours/day) Assistance recommended at discharge Frequent or constant Supervision/Assistance Functional Status Assessment Patient has had a recent decline in their functional status and demonstrates the ability to make significant improvements in function in a reasonable and predictable amount of time. Frequency and Duration  10/06/2021 Speech Therapy Frequency (ACUTE ONLY) min 1 x/week Treatment Duration 2 weeks   Oral Phase 10/06/2021 Oral Phase Impaired Oral - Pudding Teaspoon -- Oral - Pudding Cup -- Oral - Honey Teaspoon -- Oral - Honey Cup -- Oral - Nectar Teaspoon -- Oral - Nectar Cup -- Oral - Nectar Straw --  Oral - Thin Teaspoon NT Oral - Thin Cup NT Oral - Thin Straw Lingual/palatal residue Oral - Puree Lingual pumping;Incomplete tongue to palate contact;Lingual/palatal residue Oral - Mech Soft NT Oral - Regular Lingual pumping;Lingual/palatal residue;Incomplete tongue to palate contact Oral - Multi-Consistency NT Oral - Pill Lingual/palatal residue Oral Phase - Comment --  Pharyngeal Phase 10/06/2021 Pharyngeal Phase Impaired Pharyngeal- Pudding Teaspoon  -- Pharyngeal -- Pharyngeal- Pudding Cup -- Pharyngeal -- Pharyngeal- Honey Teaspoon -- Pharyngeal -- Pharyngeal- Honey Cup -- Pharyngeal -- Pharyngeal- Nectar Teaspoon -- Pharyngeal -- Pharyngeal- Nectar Cup -- Pharyngeal -- Pharyngeal- Nectar Straw -- Pharyngeal -- Pharyngeal- Thin Teaspoon NT Pharyngeal -- Pharyngeal- Thin Cup NT Pharyngeal -- Pharyngeal- Thin Straw Delayed swallow initiation-vallecula;Pharyngeal residue - valleculae;Pharyngeal residue - posterior pharnyx;Reduced tongue base retraction Pharyngeal Material enters airway, remains ABOVE vocal cords then ejected out Pharyngeal- Puree Delayed swallow initiation-vallecula;Reduced tongue base retraction;Pharyngeal residue - valleculae;Pharyngeal residue - posterior pharnyx Pharyngeal Material does not enter airway Pharyngeal- Mechanical Soft NT Pharyngeal -- Pharyngeal- Regular Delayed swallow initiation-vallecula;Reduced tongue base retraction;Pharyngeal residue - valleculae;Pharyngeal residue - posterior pharnyx Pharyngeal Material does not enter airway Pharyngeal- Multi-consistency NT Pharyngeal -- Pharyngeal- Pill Delayed swallow initiation-vallecula;Penetration/Aspiration before swallow;Penetration/Aspiration during swallow;Moderate aspiration;Pharyngeal residue - valleculae;Pharyngeal residue - posterior pharnyx Pharyngeal Material enters airway, passes BELOW cords and not ejected out despite cough attempt by patient Pharyngeal Comment --  No flowsheet data found. Herbie Baltimore, MA CCC-SLP Acute Rehabilitation Services Pager [phone] Office [phone] Lynann Beaver 10/06/2021, 2:32 PM                      PHYSICAL EXAM  Physical Exam  Constitutional: Appears well-developed and well-nourished.  MSK: no joint deformities.bka left leg Respiratory: Effort normal, non-labored breathing Skin: WDI  Neuro: Mental Status: Oriented to self only, drowsy, follows all commands No signs of aphasia or neglect Cranial Nerves: II:  PERRL III,IV, VI: EOMI without ptosis or diploplia.  V: Facial sensation is symmetric to light touch VII: Slight left facial droop VIII: Hearing is intact to voice   Motor: Tone is normal. Bulk is normal. Moves all extremities purposefully, RUE and RLE 5/5 strength, LUE 4/5 strength, LLE 3/5. Sensory: No gross sensation deficit   ASSESSMENT/PLAN Mr. Terez Millard Wolter Sr. is a 71 y.o. male with history of HTN, HLD, COPD, and CHF presenting after being found down on the ground more confused than normal. He lives with his son who was also admitted to the hospital, so the patient has not been seen by anyone for the last couple of days.  Repeat head CT today showed unchanged parenchymal and intraventricular hemorrhage. Lateral ventriculomegaly is mildly improved.  Hold ASA and plavix. CTA head  reveals occluded left V4 segment, likely chronic and stenosis of right vertebral artery with severe stenosis at V1 and V2 segments. Will repeat head CT today to assess vertebral artery stenosis   ICH vs. HT with IVH - Acute IPH right CR with IVH likely secondary small vessel disease and hypertensive source Code Stroke Head CT- 12 mm focus of acute hemorrhage within the periventricular white matter adjacent to the body of the right lateral ventricle. Intraventricular hemorrhage appears to reflect extension of this hematoma. There is resulting mild communicating hydrocephalus. Repeat head CT- Unchanged parenchymal and intraventricular hemorrhage. Lateral ventriculomegaly is mildly improved. CTA head and neck occluded left V4 segment, likely chronic and stenosis of right vertebral artery with severe stenosis at V1 and V2 segments. MRI  absent flow void in right vertebral  and proximal basilar artery with distal reconstitution, fetal PCAs, punctate acute infarct in right frontal cortex, unchanged ICH 2D Echo no atrial level shunt, EF not reported LDL 39 HgbA1c 7.3 VTE prophylaxis - SCDs, started lovenox  aspirin  81 mg daily and clopidogrel 75 mg daily prior to admission, now on No antithrombotic due to IPH with IVH.  Therapy recommendations:  SNF Disposition:  pending  Hypertension Home meds:  lasix, aldactone, coreg Stable BP relax goal to 160 as imaging is stable Long-term BP goal normotensive  Hyperlipidemia Home meds:  Atorvastatin 80mg  LDL 38, goal < 70  Resume Lipitor at discharge  Diabetes type II Controlled Home meds:  metformin, jardiance, trulicity [id number] 7.4, goal < 7.0 CBGs SSI  Other Stroke Risk Factors Advanced Age >/= 66  Cigarette smoker, advised to stop smoking Coronary artery disease Previously on ASA and plavix Congestive heart failure with reduced ejection fraction Home meds: Entresto, lasix, aldactone, jardiance, imdur- resumed  Other Active Problems BPH- flomax resumed Centrilobular emphysema CKD stage 3 Atherosclerosis or native arteries with intermittent claudication Left BKA on 11/10/2020 with vascular surgery Agitation Seroquel initiated at 50mg  HS, but he was lethargic afterward. Will discontinue seroquel as agitation has resolved  Hospital day # 7  Patient seen and examined by NP/APP with MD. MD to update note as needed.   [place] , MSN, AGACNP-BC [facility] See Amion for schedule and pager information 10/07/2021 12:00 PM  ATTENDING NOTE: I reviewed above note and agree with the assessment and plan. Pt was seen and examined.   71 year old male with history of hypertension, hyperlipidemia, CHF, diabetes, smoker, CAD admitted for confusion and found down at home.  CT showed right CR ICH with IVH.  MRI showed right CR ICH versus hemorrhagic transformation, right frontal punctate infarct.  Stable hematoma and IVH.  CT head and neck right V1 and V2 stenosis.  Left V4 occlusion, bilateral fetal PCA and aortic atherosclerosis.  EF not reported due to poor window.  LDL 39, A1c 7.3.  CT repeat 3/4 evolving ICH and IVH.  Creatinine  0.93, WBC 8.6   On exam, patient awake, alert, eyes open, orientated to age, place, but not to time. No aphasia, but paucity of speech, moderate dysarthria, following all simple commands. Able to name 3/4 and repeat simple sentence. No gaze palsy, tracking bilaterally, visual field full. No facial significant droop. Tongue midline. LUE and LLE dirft slowly but RUE and RLE no drift. Sensation symmetrical bilaterally, b/l FTN intact, gait not tested.   Etiology for patient ICH and IVH likely due to uncontrolled hypertension versus hemorrhagic transformation at right CR.  Holding off antiplatelet for now, once ICH and IVH resolved, consider to resume aspirin at that time.  BP goal normotensive.  Continue current Coreg, Lasix, isosorbide, Entresto and Norvasc for hypertension and CHF management.  Patient complaining of headache and back pain, put on Fioricet and tramadol as needed.  Consider resume statin on discharge.  PT/OT recommend SNF.  For detailed assessment and plan, please refer to above as I have made changes wherever appropriate.   Neurology will sign off. Please call with questions. Pt will follow up with stroke clinic NP at [facility] in about 4 weeks. Thanks for the consult.   Rosalin Hawking, MD PhD Stroke Neurology 10/07/2021 4:20 PM   To contact Stroke Continuity provider, please refer to [email or web address] After hours, contact General Neurology

## 2021-10-08 LAB — GLUCOSE, CAPILLARY
Glucose-Capillary: 136 mg/dL — ABNORMAL HIGH (ref 70–99)
Glucose-Capillary: 141 mg/dL — ABNORMAL HIGH (ref 70–99)
Glucose-Capillary: 180 mg/dL — ABNORMAL HIGH (ref 70–99)
Glucose-Capillary: 221 mg/dL — ABNORMAL HIGH (ref 70–99)
Glucose-Capillary: 140 mg/dL — ABNORMAL HIGH (ref 70–99)

## 2021-10-08 MED ORDER — POLYETHYLENE GLYCOL 3350 17 G PO PACK
17.0000 g | PACK | Freq: Every day | ORAL | Status: DC
  Administered 2021-10-08 – 2021-10-10 (×2): 17 g via ORAL
  Filled 2021-10-08 (×3): qty 1

## 2021-10-08 NOTE — Plan of Care (Signed)
?  Problem: Education: ?Goal: Knowledge of secondary prevention will improve (SELECT ALL) ?Outcome: Not Progressing ?  ?

## 2021-10-08 NOTE — Progress Notes (Signed)
PROGRESS NOTE    RHYSON TRAUGHBER Sr.  [id number] DOB: [birth year] DOA: 09/30/2021 PCP: [name], MD    Brief Narrative:  Mr. Shauna Varelas Nunnelley Sr. is a 71 y.o. male with history of HTN, HLD, COPD presenting after being found down on the ground more confused than normal. He lives with his son who was also admitted to the hospital, so the patient has not been seen by anyone for the last couple of days.  Patient was brought to [facility], a CT scan demonstrated intracranial hemorrhage with intraventricular extension and patient was transferred to neuro ICU at [facility] for higher level of care.  Patient on aspirin and Plavix at home.  Was treated with conservative management, Cleviprex infusion with stabilization and transferred to general medical floor on 3/3.     Assessment & Plan:   Acute hemorrhagic stroke with intraventricular hemorrhage of the right lateral ventricle, secondary to hypertensive angiopathy: Clinical findings, found fallen and confused at home. CT head findings, initial CT scan with 12 mm acute hemorrhage right lateral ventricle with intraventricular extension of hematoma, mild communicating hydrocephalus, repeat CT head unchanged and is stable. MRI of the brain, absent flow rate vertebral and proximal basilar artery with distal reconstitution, punctate hemorrhage, unchanged intracranial hemorrhage. CTA of the head and neck, occluded left before, chronically stenosis right vertebral artery and severe stenosis at B1 and B2. 2D echocardiogram, no atrial level shunt. Antiplatelet therapy, on aspirin and Plavix at home.  Currently discontinued. LDL 34.  On atorvastatin at home.  Currently on hold due to acute hemorrhage.  Hemoglobin A1c 7.4. DVT prophylaxis, on Lovenox. Disposition, skilled nursing facility after angiogram tomorrow.  Essential hypertension: At home on Lasix, Aldactone and Coreg.  Blood pressure is better on current medications. Carvedilol,  Lasix, isosorbide mononitrate, Entresto and amlodipine. Patient has not used labetalol for the last 24 hours.  Hyperlipidemia: LDL 38, at goal.  Atorvastatin on hold due to acute hemorrhagic stroke.  Resume on discharge.  Type 2 diabetes, controlled with hyperglycemia: Home medications, metformin, Jardiance and Trulicity.  Currently on sliding scale insulin.  A1c 7.4.  Fairly stable.  Will resume on discharge.  BPH: On Flomax.  Atherosclerosis, left BKA: Stable.  Continue to work with PT OT.  Is scheduled for cerebral angiogram on 3/6.  Discharge to skilled nursing facility after the procedure.   DVT prophylaxis: heparin injection 5,000 Units Start: 10/07/21 1400 SCDs Start: 10/03/21 1703 SCD's Start: 09/30/21 1847   Code Status: Full code Family Communication: None at the bedside Disposition Plan: Status is: Inpatient Remains inpatient appropriate because: Inpatient procedures planned.  Unsafe disposition.             Consultants:  Critical care Neurology Interventional radiology.  Procedures:  None  Antimicrobials:  None   Subjective:  Patient seen and examined.  No overnight events.  He has mild global headache and improved with Tylenol.  No bowel movement since admission as per patient.  Objective: Vitals:   10/07/21 1523 10/07/21 2050 10/07/21 2349 10/08/21 0326  BP: 127/79 130/80 (!) 142/85 138/89  Pulse: 80 87 88 86  Resp: 20 17 18 19   Temp: 98.1 F (36.7 C) 98.3 F (36.8 C) 98.2 F (36.8 C) 98.8 F (37.1 C)  TempSrc: Oral Oral Oral Oral  SpO2: 95% 95% 93% 93%  Weight:      Height:        Intake/Output Summary (Last 24 hours) at 10/08/2021 1033 Last data filed at 10/07/2021 2349  Gross per 24 hour  Intake 480 ml  Output 50 ml  Net 430 ml   Filed Weights   09/30/21 1800 10/05/21 0619  Weight: 98.6 kg 102.7 kg    Examination:  General: Sick looking and debilitated but not in any distress.  Looks comfortable on room air. Able to keep  up conversation. Cardiovascular: S1-S2 normal.  Regular rate rhythm. Respiratory: Bilateral clear.  No added sounds. Gastrointestinal: Soft.  Nontender.  Bowel sound present. Ext: No cyanosis or edema.  No clubbing.  Left BKA Neuro: No gross neurological deficit. Musculoskeletal: No deformities. Skin: Intact.     Data Reviewed: I have personally reviewed following labs and imaging studies  CBC: Recent Labs  Lab 10/03/21 0230 10/04/21 0758 10/05/21 0517 10/06/21 0355 10/07/21 0048  WBC 12.0* 10.0 9.5 8.6 9.2  HGB 16.5 16.1 15.0 15.2 15.0  HCT 49.5 49.2 46.0 47.7 45.5  MCV 92.7 94.3 94.5 94.5 93.0  PLT 258 235 211 227 0000000   Basic Metabolic Panel: Recent Labs  Lab 10/03/21 0230 10/04/21 0758 10/05/21 0517 10/06/21 0355 10/07/21 0048  NA 138 141 138 138 137  K 3.8 4.1 4.0 4.5 4.0  CL 107 111 109 106 104  CO2 19* 20* 22 23 25   GLUCOSE 108* 139* 119* 126* 144*  BUN 39* 44* 29* 21 25*  CREATININE 1.31* 1.39* 1.08 0.93 1.07  CALCIUM 8.8* 8.7* 8.6* 8.9 8.9   GFR: Estimated Creatinine Clearance: 79.6 mL/min (by C-G formula based on SCr of 1.07 mg/dL). Liver Function Tests: No results for input(s): AST, ALT, ALKPHOS, BILITOT, PROT, ALBUMIN in the last 168 hours.  No results for input(s): LIPASE, AMYLASE in the last 168 hours. No results for input(s): AMMONIA in the last 168 hours. Coagulation Profile: No results for input(s): INR, PROTIME in the last 168 hours.  Cardiac Enzymes: No results for input(s): CKTOTAL, CKMB, CKMBINDEX, TROPONINI in the last 168 hours. BNP (last 3 results) No results for input(s): PROBNP in the last 8760 hours. HbA1C: No results for input(s): HGBA1C in the last 72 hours. CBG: Recent Labs  Lab 10/07/21 1624 10/07/21 2049 10/07/21 2348 10/08/21 0327 10/08/21 0836  GLUCAP 156* 191* 164* 136* 141*   Lipid Profile: No results for input(s): CHOL, HDL, LDLCALC, TRIG, CHOLHDL, LDLDIRECT in the last 72 hours. Thyroid Function Tests: No  results for input(s): TSH, T4TOTAL, FREET4, T3FREE, THYROIDAB in the last 72 hours. Anemia Panel: No results for input(s): VITAMINB12, FOLATE, FERRITIN, TIBC, IRON, RETICCTPCT in the last 72 hours. Sepsis Labs: No results for input(s): PROCALCITON, LATICACIDVEN in the last 168 hours.  Recent Results (from the past 240 hour(s))  MRSA Next Gen by PCR, Nasal     Status: None   Collection Time: 09/30/21  7:25 PM   Specimen: Nasal Mucosa; Nasal Swab  Result Value Ref Range Status   MRSA by PCR Next Gen NOT DETECTED NOT DETECTED Final    Comment: (NOTE) The GeneXpert MRSA Assay (FDA approved for NASAL specimens only), is one component of a comprehensive MRSA colonization surveillance program. It is not intended to diagnose MRSA infection nor to guide or monitor treatment for MRSA infections. Test performance is not FDA approved in patients less than 35 years old. Performed at [facility], [place] [address] [place], [place] [address]          Radiology Studies: CT HEAD WO CONTRAST (5MM)  Result Date: 10/07/2021 CLINICAL DATA:  Intracranial hemorrhage follow up EXAM: CT HEAD WITHOUT CONTRAST TECHNIQUE: Contiguous axial  images were obtained from the base of the skull through the vertex without intravenous contrast. RADIATION DOSE REDUCTION: This exam was performed according to the departmental dose-optimization program which includes automated exposure control, adjustment of the mA and/or kV according to patient size and/or use of iterative reconstruction technique. COMPARISON:  10/02/2021 FINDINGS: Brain: Near resolution of intraparenchymal component right basal ganglia hemorrhage. Intraventricular blood has slightly decreased. Mild improvement in the size of ventricles. No new site of hemorrhage. No midline shift or other mass effect. Vascular: ICA calcification Skull: Negative Sinuses/Orbits: Normal Other: None IMPRESSION: 1. Near resolution of intraparenchymal component right basal  ganglia hemorrhage with slightly decreased intraventricular blood. 2. Mild improvement in the size of ventricles. Electronically Signed   By: Ulyses Jarred M.D.   On: 10/07/2021 19:42   DG Swallowing Func-Speech Pathology  Result Date: 10/06/2021 Table formatting from the original result was not included. Objective Swallowing Evaluation: Type of Study: MBS-Modified Barium Swallow Study  Completed by Vaughan Sine, SLP Student Supervised and reviewed by Herbie Baltimore MA CCC-SLP Patient Details Name: Cristopher Estimable Sangalang Sr. MRN: [id number] Date of Birth: [birth year] Today's Date: 10/06/2021 Time: SLP Start Time (ACUTE ONLY): 62 -SLP Stop Time (ACUTE ONLY): 1355 SLP Time Calculation (min) (ACUTE ONLY): 20 min Past Medical History: Past Medical History: Diagnosis Date  CAD (coronary artery disease)   a. 01/2010 PCI of LAD; b. 09/2014 PCI/DES of mLAD due to ISR. D1 80, D2 80(jailed), LCX 52m; c. 07/2016 NSTEMI/Cath: LM nl, LAD 25m ISR, 40d, D1 90ost, 80p, D2 90ost, RI min irregs, LCX min irregs, OM1 90 small, OM2/3 min irregs, RCA min irregs, RPLB1 90, EF 25-35%.  Chronic combined systolic and diastolic CHF (congestive heart failure) ([place])   a. 07/2016 Echo: EF 30%, severe septal/anterior HK, Gr1 DD.  CKD (chronic kidney disease), stage III (HCC)   COPD (chronic obstructive pulmonary disease) (HCC)   DM2 (diabetes mellitus, type 2) (HCC)   Erectile dysfunction   HLD (hyperlipidemia)   HTN (hypertension)   Ischemic cardiomyopathy   a. 07/2016 Echo: EF 30% w/ sev septal/ant HK. Gr1 DD. Past Surgical History: Past Surgical History: Procedure Laterality Date  AMPUTATION Left 11/10/2020  Procedure: AMPUTATION BELOW KNEE;  Surgeon: [name], MD;  Location: [facility] ORS;  Service: General;  Laterality: Left;  BELOW KNEE LEG AMPUTATION    CAD: stent to the LAD    CARDIAC CATHETERIZATION  10/01/2014  CARDIAC CATHETERIZATION N/A 07/23/2016  Procedure: Left Heart Cath and Coronary Angiography;  Surgeon: [name], MD;  Location:  [place] [facility];  Service: Cardiovascular;  Laterality: N/A;  CORONARY ANGIOPLASTY WITH STENT PLACEMENT  10/01/2014  LOWER EXTREMITY ANGIOGRAPHY Left 10/31/2020  Procedure: LOWER EXTREMITY ANGIOGRAPHY;  Surgeon: [name], MD;  Location: [place] [facility];  Service: Cardiovascular;  Laterality: Left; HPI: Patient is a 71 y.o. male with PMH: HTN, HLD, dementia, COPD, HLD, CAD, DM-2. Typically he has supervision and assistance from his son, but unfortunately his son was admitted and continues to be admitted at hospital after suffering intracranial hemorrhage. Patient admitted to hospital after a family member who was checking on him found him down on the ground and more confused than usual. Family member brought him to [facility] for evaluation and CT head revealed [place] with intraventricular extension. Patient was then transferred to [facility].  Subjective: pleasant, alert, asking about his phone wanting to call his son  Recommendations for follow up therapy are one component of a multi-disciplinary discharge planning process, led by  the attending physician.  Recommendations may be updated based on patient status, additional functional criteria and insurance authorization. Assessment / Plan / Recommendation Clinical Impressions 10/06/2021 Clinical Impression Patient was seen for MBS. Pt demonstrates a mild to moderate primary oropharyngeal dysphagia with a moderate aspiration risk. Patient has baseline cognitive-linguistic deficits leading to further aspiration concerns. Oral phase of swallowing impaired as evidenced by lingual residue, delayed oral transit, tongue thrusting, and reduced tongue to palate contact. Mastication slightly prolonged possibly 2/2 patient's inadequate dentition and not having dentures. Pharyngeal swallow initiation occurred at the level of the valleculae, which is WNL. Adequate laryngeal elevation and anterior movement observed. Epiglottic deflection was efficienct. Patient penetrated with thin  liquids via straw cup. However, he spontaneously and independently cleared penetrates. Residuals observed in the valleculae and posterior pharynx; pt independently and spontaneously cleared residuals with subsequent swallow. With mixed consistency of pill and thin liquid, patient aspirated thin liquid and was unable to clear aspirate despite efforts. Patient's aspiration is a result of piecemeal swallow sequence and delayed swallow initiation to the level of the pyriforms. SLP recommends initiating a Dys 3 Diet with Thin Liquids. Medications should be whole in puree at this time. In future sessions, treatment should focus on slow rate with small bites/sips facilitated to patient to aid in timing of pharyngeal swallow sequence. SLP Visit Diagnosis Dysphagia, oropharyngeal phase (R13.12) Attention and concentration deficit following -- Frontal lobe and executive function deficit following -- Impact on safety and function Moderate aspiration risk   Treatment Recommendations 10/06/2021 Treatment Recommendations Therapy as outlined in treatment plan below   Prognosis 10/06/2021 Prognosis for Safe Diet Advancement Good Barriers to Reach Goals Cognitive deficits Barriers/Prognosis Comment -- Diet Recommendations 10/06/2021 SLP Diet Recommendations Dysphagia 3 (Mech soft) solids;Thin liquid Liquid Administration via Straw Medication Administration Crushed with puree Compensations Minimize environmental distractions;Slow rate;Small sips/bites Postural Changes Seated upright at 90 degrees   Other Recommendations 10/06/2021 Recommended Consults -- Oral Care Recommendations Oral care BID Other Recommendations -- Follow Up Recommendations Skilled nursing-short term rehab (<3 hours/day) Assistance recommended at discharge Frequent or constant Supervision/Assistance Functional Status Assessment Patient has had a recent decline in their functional status and demonstrates the ability to make significant improvements in function in a  reasonable and predictable amount of time. Frequency and Duration  10/06/2021 Speech Therapy Frequency (ACUTE ONLY) min 1 x/week Treatment Duration 2 weeks   Oral Phase 10/06/2021 Oral Phase Impaired Oral - Pudding Teaspoon -- Oral - Pudding Cup -- Oral - Honey Teaspoon -- Oral - Honey Cup -- Oral - Nectar Teaspoon -- Oral - Nectar Cup -- Oral - Nectar Straw -- Oral - Thin Teaspoon NT Oral - Thin Cup NT Oral - Thin Straw Lingual/palatal residue Oral - Puree Lingual pumping;Incomplete tongue to palate contact;Lingual/palatal residue Oral - Mech Soft NT Oral - Regular Lingual pumping;Lingual/palatal residue;Incomplete tongue to palate contact Oral - Multi-Consistency NT Oral - Pill Lingual/palatal residue Oral Phase - Comment --  Pharyngeal Phase 10/06/2021 Pharyngeal Phase Impaired Pharyngeal- Pudding Teaspoon -- Pharyngeal -- Pharyngeal- Pudding Cup -- Pharyngeal -- Pharyngeal- Honey Teaspoon -- Pharyngeal -- Pharyngeal- Honey Cup -- Pharyngeal -- Pharyngeal- Nectar Teaspoon -- Pharyngeal -- Pharyngeal- Nectar Cup -- Pharyngeal -- Pharyngeal- Nectar Straw -- Pharyngeal -- Pharyngeal- Thin Teaspoon NT Pharyngeal -- Pharyngeal- Thin Cup NT Pharyngeal -- Pharyngeal- Thin Straw Delayed swallow initiation-vallecula;Pharyngeal residue - valleculae;Pharyngeal residue - posterior pharnyx;Reduced tongue base retraction Pharyngeal Material enters airway, remains ABOVE vocal cords then ejected out Pharyngeal- Puree Delayed swallow initiation-vallecula;Reduced tongue base  retraction;Pharyngeal residue - valleculae;Pharyngeal residue - posterior pharnyx Pharyngeal Material does not enter airway Pharyngeal- Mechanical Soft NT Pharyngeal -- Pharyngeal- Regular Delayed swallow initiation-vallecula;Reduced tongue base retraction;Pharyngeal residue - valleculae;Pharyngeal residue - posterior pharnyx Pharyngeal Material does not enter airway Pharyngeal- Multi-consistency NT Pharyngeal -- Pharyngeal- Pill Delayed swallow  initiation-vallecula;Penetration/Aspiration before swallow;Penetration/Aspiration during swallow;Moderate aspiration;Pharyngeal residue - valleculae;Pharyngeal residue - posterior pharnyx Pharyngeal Material enters airway, passes BELOW cords and not ejected out despite cough attempt by patient Pharyngeal Comment --  No flowsheet data found. Herbie Baltimore, MA CCC-SLP Acute Rehabilitation Services Pager [phone] Office [phone] Lynann Beaver 10/06/2021, 2:32 PM                          Scheduled Meds:  amLODipine  5 mg Oral Daily   carvedilol  25 mg Oral BID WC   chlorhexidine  15 mL Mouth Rinse BID   escitalopram  10 mg Oral Daily   folic acid  1 mg Oral Daily   furosemide  20 mg Oral Daily   gabapentin  300 mg Oral QHS   heparin injection (subcutaneous)  5,000 Units Subcutaneous Q8H   insulin aspart  0-15 Units Subcutaneous Q4H   isosorbide mononitrate  10 mg Oral TID   mouth rinse  15 mL Mouth Rinse q12n4p   multivitamin with minerals  1 tablet Oral Daily   pantoprazole  40 mg Oral QHS   polyethylene glycol  17 g Oral Daily   sacubitril-valsartan  1 tablet Oral BID   senna-docusate  1 tablet Oral BID   thiamine  100 mg Oral Daily   Continuous Infusions:  [START ON 10/09/2021]  ceFAZolin (ANCEF) IV       LOS: 8 days    Time spent: 35 minutes    Barb Merino, MD [facility] Pager [phone]

## 2021-10-08 NOTE — Plan of Care (Signed)
  Problem: Health Behavior/Discharge Planning: Goal: Ability to manage health-related needs will improve Outcome: Progressing   Problem: Clinical Measurements: Goal: Ability to maintain clinical measurements within normal limits will improve Outcome: Progressing   Problem: Clinical Measurements: Goal: Will remain free from infection Outcome: Progressing   Problem: Clinical Measurements: Goal: Diagnostic test results will improve Outcome: Progressing   

## 2021-10-09 LAB — GLUCOSE, CAPILLARY
Glucose-Capillary: 138 mg/dL — ABNORMAL HIGH (ref 70–99)
Glucose-Capillary: 147 mg/dL — ABNORMAL HIGH (ref 70–99)
Glucose-Capillary: 150 mg/dL — ABNORMAL HIGH (ref 70–99)
Glucose-Capillary: 157 mg/dL — ABNORMAL HIGH (ref 70–99)
Glucose-Capillary: 162 mg/dL — ABNORMAL HIGH (ref 70–99)
Glucose-Capillary: 167 mg/dL — ABNORMAL HIGH (ref 70–99)
Glucose-Capillary: 206 mg/dL — ABNORMAL HIGH (ref 70–99)

## 2021-10-09 MED ORDER — GERHARDT'S BUTT CREAM
TOPICAL_CREAM | Freq: Three times a day (TID) | CUTANEOUS | Status: DC
  Administered 2021-10-10: 1 via TOPICAL
  Filled 2021-10-09: qty 1

## 2021-10-09 NOTE — TOC Progression Note (Addendum)
Transition of Care (TOC) - Progression Note  ? ? ?Patient Details  ?Name: Danny Rottenberg Faraj Sr. ?MRN: [id number] ?Date of Birth: 12-04-1950 ? ?Transition of Care (TOC) CM/SW Contact  ?Avrianna Smart Aris Lot, LCSW ?Phone Number: ?10/09/2021, 11:51 AM ? ?Clinical Narrative:    ? ?CSW called pt daughter and provided SNF offers. She chooses [facility]. CSW confirmed offer with [facility]. Attempted to start auth but pt's [employer] is listed as active only from 09/06/21 -- 10/03/21. CSW called daughter back and inquired about any insurance changes. She does indicate that pt has had a change but is unaware of member ID or new medicare plan; she will speak with ex sister in-law. ? ?CSW looked in pt's chart and found a [facility] medicare card scanned in chart. CSW used this information and Talbot Grumbling shows this plan is active on 10/04/21. CSW submitted SNF auth request.  ? ?1215: Daughter called CSW back; stated she does not want [facility]. States she will look into [facility] and call CSW back.  ? ?Expected Discharge Plan: Skilled Nursing Facility ?Barriers to Discharge: Continued Medical Work up, English as a second language teacher, SNF Pending bed offer ? ?Expected Discharge Plan and Services ?Expected Discharge Plan: Skilled Nursing Facility ?In-house Referral: Clinical Social Work ?  ?Post Acute Care Choice: Skilled Nursing Facility ?Living arrangements for the past 2 months: Single Family Home ?                ?  ?  ?  ?  ?  ?  ?  ?  ?  ?  ? ? ?Social Determinants of Health (SDOH) Interventions ?  ? ?Readmission Risk Interventions ?Readmission Risk Prevention Plan 07/30/2019  ?Transportation Screening Complete  ?PCP or Specialist Appt within 5-7 Days Complete  ?Medication Review (RN CM) Complete  ?Some recent data might be hidden  ? ? ?

## 2021-10-09 NOTE — Progress Notes (Signed)
PROGRESS NOTE    Danny SCHIERMAN Sr.  WIO:[id number] DOB: [birth year] DOA: 09/30/2021 PCP: [name], MD    Brief Narrative:  Mr. Danny Kult Rambeau Sr. is a 71 y.o. male with history of HTN, HLD, COPD presenting after being found down on the ground more confused than normal. He lives with his son who was also admitted to the hospital, so the patient has not been seen by anyone for the last couple of days.  Patient was brought to [facility], a CT scan demonstrated intracranial hemorrhage with intraventricular extension and patient was transferred to neuro ICU at [facility] for higher level of care.  Patient on aspirin and Plavix at home.  Was treated with conservative management, Cleviprex infusion with stabilization and transferred to general medical floor on 3/3.     Assessment & Plan:   Acute hemorrhagic stroke with intraventricular hemorrhage of the right lateral ventricle, secondary to hypertensive angiopathy: Clinical findings, found fallen and confused at home. CT head findings, initial CT scan with 12 mm acute hemorrhage right lateral ventricle with intraventricular extension of hematoma, mild communicating hydrocephalus, repeat CT head unchanged and is stable. MRI of the brain, absent flow rate vertebral and proximal basilar artery with distal reconstitution, punctate hemorrhage, unchanged intracranial hemorrhage. CTA of the head and neck, occluded left before, chronically stenosis right vertebral artery and severe stenosis at B1 and B2. 2D echocardiogram, no atrial level shunt. Antiplatelet therapy, on aspirin and Plavix at home.  Currently discontinued. LDL 34.  On atorvastatin at home.  Neurology okay to resume on discharge. Hemoglobin A1c 7.4. DVT prophylaxis, on Lovenox.   Essential hypertension: At home on Lasix, Aldactone and Coreg.  Blood pressure is better on current medications. Carvedilol, Lasix, isosorbide mononitrate, Entresto and amlodipine.  No changes  today.  Hyperlipidemia: LDL 38, at goal.  Atorvastatin on hold due to acute hemorrhagic stroke.  Resume on discharge.  Type 2 diabetes, controlled with hyperglycemia: Home medications, metformin, Jardiance and Trulicity.  Currently on sliding scale insulin.  A1c 7.4.  Fairly stable.  Will resume on discharge.  BPH: On Flomax.  Atherosclerosis, left BKA: Stable.  Continue to work with PT OT.  Scheduled for cerebral angiogram today.  Should be able to go to a skilled nursing facility after the radiology procedure, anticipate tomorrow morning.   DVT prophylaxis: heparin injection 5,000 Units Start: 10/07/21 1400 SCDs Start: 10/03/21 1703 SCD's Start: 09/30/21 1847   Code Status: Full code Family Communication: Daughter on the phone 3/5. Disposition Plan: Status is: Inpatient Remains inpatient appropriate because: Inpatient procedures planned.  Unsafe disposition.    Consultants:  Critical care Neurology Interventional radiology.  Procedures:  None  Antimicrobials:  None   Subjective:  Seen and examined.  No overnight events.  Tired and sleepy today.  Aware about the procedure today.  Objective: Vitals:   10/09/21 0409 10/09/21 0430 10/09/21 0539 10/09/21 0918  BP: (!) 166/99 (!) 177/96 (!) 166/85 (!) 147/92  Pulse: 68   66  Resp: 14 14 12 18   Temp: (!) 97.3 F (36.3 C)   98.2 F (36.8 C)  TempSrc: Oral     SpO2: 94%   91%  Weight:      Height:        Intake/Output Summary (Last 24 hours) at 10/09/2021 1111 Last data filed at 10/09/2021 0824 Gross per 24 hour  Intake 0 ml  Output 800 ml  Net -800 ml   Filed Weights   09/30/21 1800 10/05/21 3845  Weight: 98.6 kg 102.7 kg    Examination:  General: Frail and debilitated.  Flat affect and anxious mood today.  On room air.  Not in any obvious distress. Cardiovascular: S1-S2 normal.  Regular rate rhythm. Respiratory: Bilateral clear.  No added sounds. Gastrointestinal: Soft.  Nontender.  Bowel sound  present. Ext: No cyanosis or edema.  Left BKA. Neuro: Flat affect.  Alert and oriented x2-3.  Moves all extremities.       Data Reviewed: I have personally reviewed following labs and imaging studies  CBC: Recent Labs  Lab 10/03/21 0230 10/04/21 0758 10/05/21 0517 10/06/21 0355 10/07/21 0048  WBC 12.0* 10.0 9.5 8.6 9.2  HGB 16.5 16.1 15.0 15.2 15.0  HCT 49.5 49.2 46.0 47.7 45.5  MCV 92.7 94.3 94.5 94.5 93.0  PLT 258 235 211 227 243   Basic Metabolic Panel: Recent Labs  Lab 10/03/21 0230 10/04/21 0758 10/05/21 0517 10/06/21 0355 10/07/21 0048  NA 138 141 138 138 137  K 3.8 4.1 4.0 4.5 4.0  CL 107 111 109 106 104  CO2 19* 20* 22 23 25   GLUCOSE 108* 139* 119* 126* 144*  BUN 39* 44* 29* 21 25*  CREATININE 1.31* 1.39* 1.08 0.93 1.07  CALCIUM 8.8* 8.7* 8.6* 8.9 8.9   GFR: Estimated Creatinine Clearance: 79.6 mL/min (by C-G formula based on SCr of 1.07 mg/dL). Liver Function Tests: No results for input(s): AST, ALT, ALKPHOS, BILITOT, PROT, ALBUMIN in the last 168 hours.  No results for input(s): LIPASE, AMYLASE in the last 168 hours. No results for input(s): AMMONIA in the last 168 hours. Coagulation Profile: No results for input(s): INR, PROTIME in the last 168 hours.  Cardiac Enzymes: No results for input(s): CKTOTAL, CKMB, CKMBINDEX, TROPONINI in the last 168 hours. BNP (last 3 results) No results for input(s): PROBNP in the last 8760 hours. HbA1C: No results for input(s): HGBA1C in the last 72 hours. CBG: Recent Labs  Lab 10/08/21 1557 10/08/21 2053 10/09/21 0010 10/09/21 0440 10/09/21 0755  GLUCAP 221* 140* 167* 147* 138*   Lipid Profile: No results for input(s): CHOL, HDL, LDLCALC, TRIG, CHOLHDL, LDLDIRECT in the last 72 hours. Thyroid Function Tests: No results for input(s): TSH, T4TOTAL, FREET4, T3FREE, THYROIDAB in the last 72 hours. Anemia Panel: No results for input(s): VITAMINB12, FOLATE, FERRITIN, TIBC, IRON, RETICCTPCT in the last 72  hours. Sepsis Labs: No results for input(s): PROCALCITON, LATICACIDVEN in the last 168 hours.  Recent Results (from the past 240 hour(s))  MRSA Next Gen by PCR, Nasal     Status: None   Collection Time: 09/30/21  7:25 PM   Specimen: Nasal Mucosa; Nasal Swab  Result Value Ref Range Status   MRSA by PCR Next Gen NOT DETECTED NOT DETECTED Final    Comment: (NOTE) The GeneXpert MRSA Assay (FDA approved for NASAL specimens only), is one component of a comprehensive MRSA colonization surveillance program. It is not intended to diagnose MRSA infection nor to guide or monitor treatment for MRSA infections. Test performance is not FDA approved in patients less than 27 years old. Performed at [facility], [address], [place], [place]          Radiology Studies: CT HEAD WO CONTRAST ([id number])  Result Date: 10/07/2021 CLINICAL DATA:  Intracranial hemorrhage follow up EXAM: CT HEAD WITHOUT CONTRAST TECHNIQUE: Contiguous axial images were obtained from the base of the skull through the vertex without intravenous contrast. RADIATION DOSE REDUCTION: This exam was performed according to the departmental  dose-optimization program which includes automated exposure control, adjustment of the mA and/or kV according to patient size and/or use of iterative reconstruction technique. COMPARISON:  10/02/2021 FINDINGS: Brain: Near resolution of intraparenchymal component right basal ganglia hemorrhage. Intraventricular blood has slightly decreased. Mild improvement in the size of ventricles. No new site of hemorrhage. No midline shift or other mass effect. Vascular: ICA calcification Skull: Negative Sinuses/Orbits: Normal Other: None IMPRESSION: 1. Near resolution of intraparenchymal component right basal ganglia hemorrhage with slightly decreased intraventricular blood. 2. Mild improvement in the size of ventricles. Electronically Signed   By: Deatra Robinson M.D.   On: 10/07/2021 19:42         Scheduled Meds:  amLODipine  5 mg Oral Daily   carvedilol  25 mg Oral BID WC   chlorhexidine  15 mL Mouth Rinse BID   escitalopram  10 mg Oral Daily   folic acid  1 mg Oral Daily   furosemide  20 mg Oral Daily   gabapentin  300 mg Oral QHS   heparin injection (subcutaneous)  5,000 Units Subcutaneous Q8H   insulin aspart  0-15 Units Subcutaneous Q4H   isosorbide mononitrate  10 mg Oral TID   mouth rinse  15 mL Mouth Rinse q12n4p   multivitamin with minerals  1 tablet Oral Daily   pantoprazole  40 mg Oral QHS   polyethylene glycol  17 g Oral Daily   sacubitril-valsartan  1 tablet Oral BID   senna-docusate  1 tablet Oral BID   thiamine  100 mg Oral Daily   Continuous Infusions:   ceFAZolin (ANCEF) IV       LOS: 9 days    Time spent: 35 minutes    Dorcas Carrow, MD [facility] Pager [phone]

## 2021-10-09 NOTE — Progress Notes (Signed)
Occupational Therapy Treatment ?Patient Details ?Name: Danny Fulford Weichel Sr. ?MRN: [id number] ?DOB: [birth year] ?Today's Date: 10/09/2021 ? ? ?History of present illness Danny BLAIZE Sr. is a 71 y.o. male who presented to [facility] after being found down by family. CT: positive for R IVH.  Transferred to [facility]. MRI: Punctate acute infarct in the right frontal cortex and unchanged hemorrhagic volume and ventriculomegaly. PHMx: hypertension, COPD, DM, CAD CHF, PAD s/p L BKA 11/2020, anddementia. ?  ?OT comments ? Patient appeared lethargic but agreeable to OT session.  Patient was max assist to get to EOB and required max assist for sitting balance due to posterior and left lateral leaning. Patient attempted to perform face washing but continued to required increased assistance with sitting balance. Patient assisted back to supine to perform UB bathing and gown change.  Patient was placed in chair position and BP checked with 128/87 and HR 71.  Nursing notified and states that she was reported that he was lethargic throughout the night. Acute OT to continue to follow.   ? ?Recommendations for follow up therapy are one component of a multi-disciplinary discharge planning process, led by the attending physician.  Recommendations may be updated based on patient status, additional functional criteria and insurance authorization. ?   ?Follow Up Recommendations ? Skilled nursing-short term rehab (<3 hours/day)  ?  ?Assistance Recommended at Discharge Frequent or constant Supervision/Assistance  ?Patient can return home with the following ? Two people to help with walking and/or transfers;A lot of help with bathing/dressing/bathroom;Assistance with cooking/housework;Assistance with feeding;Help with stairs or ramp for entrance;Assist for transportation;Direct supervision/assist for financial management;Direct supervision/assist for medications management ?  ?Equipment Recommendations ? BSC/3in1;Tub/shower  bench  ?  ?Recommendations for Other Services   ? ?  ?Precautions / Restrictions Precautions ?Precautions: Fall ?Restrictions ?Weight Bearing Restrictions: No  ? ? ?  ? ?Mobility Bed Mobility ?Overal bed mobility: Needs Assistance ?Bed Mobility: Supine to Sit, Sit to Supine ?  ?  ?Supine to sit: Max assist ?Sit to supine: Max assist, +2 for physical assistance ?  ?General bed mobility comments: patient required assistance with trunk and BLEs to get to EOB ?  ? ?Transfers ?  ?  ?  ?  ?  ?  ?  ?  ?  ?  ?  ?  ?Balance Overall balance assessment: Needs assistance ?Sitting-balance support: Bilateral upper extremity supported, Feet supported ?Sitting balance-[name]: Poor ?Sitting balance - Comments: patient was max assist due to posterior and left lateral leaning ?Postural control: Left lateral lean, Posterior lean ?  ?  ?  ?  ?  ?  ?  ?  ?  ?  ?  ?  ?  ?  ?   ? ?ADL either performed or assessed with clinical judgement  ? ?ADL Overall ADL's : Needs assistance/impaired ?  ?  ?Grooming: Wash/dry hands;Wash/dry face;Minimal assistance;Bed level ?Grooming Details (indicate cue type and reason): washed face while seated on EOB with min assist to begin ?Upper Body Bathing: Moderate assistance;Bed level ?Upper Body Bathing Details (indicate cue type and reason): patient had difficulty due to level of arousal ?  ?  ?Upper Body Dressing : Maximal assistance;Bed level ?  ?  ?  ?  ?  ?  ?  ?  ?  ?  ?General ADL Comments: patient had difficulty following directions but attempted to participate with washing face and UB ?  ? ?Extremity/Trunk Assessment   ?  ?  ?  ?  ?  ? ?  Vision   ?  ?  ?Perception   ?  ?Praxis   ?  ? ?Cognition Arousal/Alertness: Lethargic ?Behavior During Therapy: Flat affect ?Overall Cognitive Status: History of cognitive impairments - at baseline ?  ?  ?  ?  ?  ?  ?  ?  ?  ?  ?  ?  ?  ?  ?  ?  ?General Comments: lethargic and required frequent cues to follow directions and to increase arousal ?  ?  ?    ?Exercises   ? ?  ?Shoulder Instructions   ? ? ?  ?General Comments BP 128/87 and HR 71 while in chair position in bed  ? ? ?Pertinent Vitals/ Pain       Pain Assessment ?Pain Assessment: Faces ?Faces Pain Scale: Hurts little more ?Pain Location: headache ?Pain Descriptors / Indicators: Headache ?Pain Intervention(s): Monitored during session ? ?Home Living   ?  ?  ?  ?  ?  ?  ?  ?  ?  ?  ?  ?  ?  ?  ?  ?  ?  ?  ? ?  ?Prior Functioning/Environment    ?  ?  ?  ?   ? ?Frequency ? Min 2X/week  ? ? ? ? ?  ?Progress Toward Goals ? ?OT Goals(current goals can now be found in the care plan section) ? Progress towards OT goals: Progressing toward goals ? ?Acute Rehab OT Goals ?OT Goal Formulation: Patient unable to participate in goal setting ?Time For Goal Achievement: 10/18/21 ?Potential to Achieve Goals: Fair ?ADL Goals ?Pt Will Perform Grooming: with min assist;sitting ?Pt Will Perform Upper Body Bathing: with min assist;sitting ?Pt Will Perform Upper Body Dressing: with min assist;sitting ?Pt Will Transfer to Toilet: with min assist;stand pivot transfer;bedside commode ?Pt/caregiver will Perform Home Exercise Program: Increased strength;Both right and left upper extremity;With minimal assist  ?Plan Discharge plan remains appropriate   ? ?Co-evaluation ? ? ?   ?  ?  ?  ?  ? ?  ?AM-PAC OT "6 Clicks" Daily Activity     ?Outcome Measure ? ? Help from another person eating meals?: A Lot ?Help from another person taking care of personal grooming?: A Lot ?Help from another person toileting, which includes using toliet, bedpan, or urinal?: A Lot ?Help from another person bathing (including washing, rinsing, drying)?: A Lot ?Help from another person to put on and taking off regular upper body clothing?: A Lot ?Help from another person to put on and taking off regular lower body clothing?: A Lot ?6 Click Score: 12 ? ?  ?End of Session   ? ?OT Visit Diagnosis: Muscle weakness (generalized) (M62.81);Other symptoms and signs  involving cognitive function ?  ?Activity Tolerance Patient limited by lethargy ?  ?Patient Left in bed;with call bell/phone within reach;with bed alarm set ?  ?Nurse Communication Other (comment) (level of arousal) ?  ? ?   ? ?Time: [id number] ?OT Time Calculation (min): 23 min ? ?Charges: OT General Charges ?$OT Visit: 1 Visit ?OT Treatments ?$Self Care/Home Management : 8-22 mins ? ?Lodema Hong, OTA ?Acute Rehabilitation Services  ?Pager [phone] ?Office [phone] ? ? ?Trixie Dredge ?10/09/2021, 12:48 PM ?

## 2021-10-09 NOTE — Plan of Care (Signed)
?  Problem: Health Behavior/Discharge Planning: ?Goal: Ability to manage health-related needs will improve ?Outcome: Progressing ?  ?Problem: Clinical Measurements: ?Goal: Diagnostic test results will improve ?Outcome: Progressing ?  ?Problem: Education: ?Goal: Knowledge of secondary prevention will improve (SELECT ALL) ?Outcome: Progressing ?  ?

## 2021-10-09 NOTE — Progress Notes (Signed)
SLP Cancellation Note ? ?Patient Details ?Name: Natrell Koss Vandam Sr. ?MRN: [id number] ?DOB: 02/18/1951 ? ? ?Cancelled treatment:       Reason Eval/Treat Not Completed: Patient at procedure or test/unavailable. NPO for cerebral angiogram today. Will f/u  ? ? ?Milli Woolridge, Katherene Ponto ?10/09/2021, 11:07 AM ?

## 2021-10-10 ENCOUNTER — Inpatient Hospital Stay (HOSPITAL_COMMUNITY): Payer: Medicare HMO

## 2021-10-10 DIAGNOSIS — I70262 Atherosclerosis of native arteries of extremities with gangrene, left leg: Secondary | ICD-10-CM | POA: Diagnosis not present

## 2021-10-10 DIAGNOSIS — E7800 Pure hypercholesterolemia, unspecified: Secondary | ICD-10-CM | POA: Diagnosis not present

## 2021-10-10 DIAGNOSIS — E1165 Type 2 diabetes mellitus with hyperglycemia: Secondary | ICD-10-CM | POA: Diagnosis not present

## 2021-10-10 DIAGNOSIS — R262 Difficulty in walking, not elsewhere classified: Secondary | ICD-10-CM | POA: Diagnosis not present

## 2021-10-10 DIAGNOSIS — I69820 Aphasia following other cerebrovascular disease: Secondary | ICD-10-CM | POA: Diagnosis not present

## 2021-10-10 DIAGNOSIS — I69991 Dysphagia following unspecified cerebrovascular disease: Secondary | ICD-10-CM | POA: Diagnosis not present

## 2021-10-10 DIAGNOSIS — K219 Gastro-esophageal reflux disease without esophagitis: Secondary | ICD-10-CM | POA: Diagnosis not present

## 2021-10-10 DIAGNOSIS — E785 Hyperlipidemia, unspecified: Secondary | ICD-10-CM | POA: Diagnosis not present

## 2021-10-10 DIAGNOSIS — L89322 Pressure ulcer of left buttock, stage 2: Secondary | ICD-10-CM | POA: Diagnosis not present

## 2021-10-10 DIAGNOSIS — Z743 Need for continuous supervision: Secondary | ICD-10-CM | POA: Diagnosis not present

## 2021-10-10 DIAGNOSIS — F32A Depression, unspecified: Secondary | ICD-10-CM | POA: Diagnosis not present

## 2021-10-10 DIAGNOSIS — Z1159 Encounter for screening for other viral diseases: Secondary | ICD-10-CM | POA: Diagnosis not present

## 2021-10-10 DIAGNOSIS — I2510 Atherosclerotic heart disease of native coronary artery without angina pectoris: Secondary | ICD-10-CM | POA: Diagnosis not present

## 2021-10-10 DIAGNOSIS — J449 Chronic obstructive pulmonary disease, unspecified: Secondary | ICD-10-CM | POA: Diagnosis not present

## 2021-10-10 DIAGNOSIS — E46 Unspecified protein-calorie malnutrition: Secondary | ICD-10-CM | POA: Diagnosis not present

## 2021-10-10 DIAGNOSIS — E1169 Type 2 diabetes mellitus with other specified complication: Secondary | ICD-10-CM | POA: Diagnosis not present

## 2021-10-10 DIAGNOSIS — R531 Weakness: Secondary | ICD-10-CM | POA: Diagnosis not present

## 2021-10-10 DIAGNOSIS — Z7401 Bed confinement status: Secondary | ICD-10-CM | POA: Diagnosis not present

## 2021-10-10 DIAGNOSIS — R079 Chest pain, unspecified: Secondary | ICD-10-CM | POA: Diagnosis not present

## 2021-10-10 DIAGNOSIS — I6501 Occlusion and stenosis of right vertebral artery: Secondary | ICD-10-CM | POA: Diagnosis not present

## 2021-10-10 DIAGNOSIS — I10 Essential (primary) hypertension: Secondary | ICD-10-CM | POA: Diagnosis not present

## 2021-10-10 DIAGNOSIS — M6281 Muscle weakness (generalized): Secondary | ICD-10-CM | POA: Diagnosis not present

## 2021-10-10 DIAGNOSIS — Z89512 Acquired absence of left leg below knee: Secondary | ICD-10-CM | POA: Diagnosis not present

## 2021-10-10 DIAGNOSIS — I70261 Atherosclerosis of native arteries of extremities with gangrene, right leg: Secondary | ICD-10-CM | POA: Diagnosis not present

## 2021-10-10 DIAGNOSIS — R2681 Unsteadiness on feet: Secondary | ICD-10-CM | POA: Diagnosis not present

## 2021-10-10 DIAGNOSIS — I5042 Chronic combined systolic (congestive) and diastolic (congestive) heart failure: Secondary | ICD-10-CM | POA: Diagnosis not present

## 2021-10-10 DIAGNOSIS — E1140 Type 2 diabetes mellitus with diabetic neuropathy, unspecified: Secondary | ICD-10-CM | POA: Diagnosis not present

## 2021-10-10 DIAGNOSIS — N183 Chronic kidney disease, stage 3 unspecified: Secondary | ICD-10-CM | POA: Diagnosis not present

## 2021-10-10 DIAGNOSIS — G910 Communicating hydrocephalus: Secondary | ICD-10-CM | POA: Diagnosis not present

## 2021-10-10 DIAGNOSIS — M6259 Muscle wasting and atrophy, not elsewhere classified, multiple sites: Secondary | ICD-10-CM | POA: Diagnosis not present

## 2021-10-10 DIAGNOSIS — I615 Nontraumatic intracerebral hemorrhage, intraventricular: Secondary | ICD-10-CM | POA: Diagnosis not present

## 2021-10-10 DIAGNOSIS — J439 Emphysema, unspecified: Secondary | ICD-10-CM | POA: Diagnosis not present

## 2021-10-10 DIAGNOSIS — I610 Nontraumatic intracerebral hemorrhage in hemisphere, subcortical: Secondary | ICD-10-CM | POA: Diagnosis not present

## 2021-10-10 LAB — CBC
HCT: 43.2 % (ref 39.0–52.0)
Hemoglobin: 14.2 g/dL (ref 13.0–17.0)
MCH: 30.8 pg (ref 26.0–34.0)
MCHC: 32.9 g/dL (ref 30.0–36.0)
MCV: 93.7 fL (ref 80.0–100.0)
Platelets: 266 10*3/uL (ref 150–400)
RBC: 4.61 MIL/uL (ref 4.22–5.81)
RDW: 13.3 % (ref 11.5–15.5)
WBC: 10.1 10*3/uL (ref 4.0–10.5)
nRBC: 0 % (ref 0.0–0.2)

## 2021-10-10 LAB — BASIC METABOLIC PANEL
Anion gap: 8 (ref 5–15)
BUN: 27 mg/dL — ABNORMAL HIGH (ref 8–23)
CO2: 24 mmol/L (ref 22–32)
Calcium: 8.9 mg/dL (ref 8.9–10.3)
Chloride: 102 mmol/L (ref 98–111)
Creatinine, Ser: 1.21 mg/dL (ref 0.61–1.24)
GFR, Estimated: >60 mL/min (ref >60)
Glucose, Bld: 124 mg/dL — ABNORMAL HIGH (ref 70–99)
Potassium: 3.8 mmol/L (ref 3.5–5.1)
Sodium: 134 mmol/L — ABNORMAL LOW (ref 135–145)

## 2021-10-10 LAB — MAGNESIUM: Magnesium: 2.2 mg/dL (ref 1.7–2.4)

## 2021-10-10 LAB — GLUCOSE, CAPILLARY
Glucose-Capillary: 122 mg/dL — ABNORMAL HIGH (ref 70–99)
Glucose-Capillary: 113 mg/dL — ABNORMAL HIGH (ref 70–99)
Glucose-Capillary: 162 mg/dL — ABNORMAL HIGH (ref 70–99)
Glucose-Capillary: 231 mg/dL — ABNORMAL HIGH (ref 70–99)
Glucose-Capillary: 140 mg/dL — ABNORMAL HIGH (ref 70–99)

## 2021-10-10 MED ORDER — INSULIN ASPART 100 UNIT/ML IJ SOLN
0.0000 [IU] | Freq: Three times a day (TID) | INTRAMUSCULAR | 11 refills | Status: DC
Start: 2021-10-10 — End: 2022-05-23

## 2021-10-10 MED ORDER — BUTALBITAL-APAP-CAFFEINE 50-325-40 MG PO TABS: 1.0000 | ORAL_TABLET | Freq: Three times a day (TID) | ORAL | 0 refills | Status: DC | PRN

## 2021-10-10 MED ORDER — ACETAMINOPHEN 325 MG PO TABS: 650.0000 mg | ORAL_TABLET | ORAL | Status: AC | PRN

## 2021-10-10 MED ORDER — AMLODIPINE BESYLATE 5 MG PO TABS: 5.0000 mg | ORAL_TABLET | Freq: Every day | ORAL | 0 refills | Status: DC

## 2021-10-10 MED ORDER — TRAMADOL HCL 50 MG PO TABS: 50.0000 mg | ORAL_TABLET | Freq: Four times a day (QID) | ORAL | 0 refills | Status: AC | PRN

## 2021-10-10 MED ORDER — CARVEDILOL 25 MG PO TABS
25.0000 mg | ORAL_TABLET | Freq: Two times a day (BID) | ORAL | 0 refills | Status: DC
Start: 2021-10-10 — End: 2022-01-03

## 2021-10-10 NOTE — Discharge Summary (Signed)
Physician Discharge Summary  Danny [name] Sr. [id number] DOB: [birth year] DOA: 09/30/2021  PCP: Danny Haven, MD  Admit date: 09/30/2021 Discharge date: 10/10/2021  Admitted From: Home  Disposition:  SNF  Recommendations for Outpatient Follow-up:  Follow up with PCP in 1-2 weeks Outpatient referral to neurology for follow-up. Interventional radiology will schedule follow-up.  Home Health: N/A Equipment/Devices: N/A  Discharge Condition: Fair CODE STATUS: Full code Diet recommendation: Low-salt, low-carb diet  Discharge summary: [name] is a 71 y.o. male with history of HTN, HLD, COPD presenting after being found down on the ground more confused than normal. He lives with his son who was also admitted to the hospital, so the patient has not been seen by anyone for the last couple of days.  Patient was brought to [facility], a CT scan demonstrated intracranial hemorrhage with intraventricular extension and patient was transferred to neuro ICU at [facility] for higher level of care.  Patient on aspirin and Plavix at home.  Was treated with conservative management, Cleviprex infusion with stabilization and transferred to general medical floor on 3/3.      Assessment & plan of care:   Acute hemorrhagic stroke with intraventricular hemorrhage of the right lateral ventricle, secondary to hypertensive angiopathy: Clinical findings, found fallen and confused at home.  Patient has developed some cognitive problem for some time. CT head findings, initial CT scan with 12 mm acute hemorrhage right lateral ventricle with intraventricular extension of hematoma, mild communicating hydrocephalus, repeat CT head unchanged and is stable. MRI of the brain, absent flow rate vertebral and proximal basilar artery with distal reconstitution, punctate hemorrhage, unchanged intracranial hemorrhage. CTA of the head and neck, occluded left before, chronically stenosis right vertebral  artery and severe stenosis at B1 and B2. 2D echocardiogram, no atrial level shunt. Antiplatelet therapy, on aspirin and Plavix at home.  Currently discontinued. LDL 34.  On atorvastatin at home.  Neurology okay to resume on discharge. Hemoglobin A1c 7.4. CT scan of the head 3/4, stable.  Repeat CT scan of the head 3/7, stable. Patient has developed some headache which is probably due to meningeal irritation, using Fioricet and tramadol. Can use Tylenol, Fioricet or tramadol depending upon severity of headache. Outpatient neurology follow-up. He will also need neurocognitive evaluation on follow-up.    Essential hypertension: At home on Lasix, Aldactone and Coreg.  Blood pressure is better on current medications including Carvedilol, Lasix, isosorbide mononitrate, Entresto and amlodipine and spironolactone.  We will keep on current doses.  Hyperlipidemia: LDL 38, at goal.  Resume atorvastatin.  Type 2 diabetes, controlled with hyperglycemia: Home medications, metformin, Jardiance and Trulicity and 40 units of long-acting insulin. His blood sugars are well controlled on current doses of sliding scale insulin.  His appetite is not fixed.  We will continue on sliding dose insulin, resume metformin and Jardiance.  He can also resume Trulicity [id number] week.  Once his appetite improves, may need more insulin.   BPH: On Flomax.   Atherosclerosis, left BKA: Stable.   Patient with severe systemic problems, high risk of readmission, however currently stabilized to transfer to a skilled level of care. Patient's daughter called and updated. Patient's son also developed similar severe intracranial hemorrhage 2 days prior and still remains in intensive care unit.  Patient is worried about him.    Discharge Diagnoses:  Principal Problem:   ICH (intracerebral hemorrhage) [facility])    Discharge Instructions  Discharge Instructions     Ambulatory referral to  Neurology   Complete by: As  directed    Follow up with stroke clinic NP (Jessica Vanschaick or Cecille Rubin, if both not available, consider Zachery Dauer, or Ahern) at [facility] in about 4 weeks. Thanks.   Call MD for:  extreme fatigue   Complete by: As directed    Call MD for:  severe uncontrolled pain   Complete by: As directed    Diet - low sodium heart healthy   Complete by: As directed    Diet Carb Modified   Complete by: As directed    Increase activity slowly   Complete by: As directed       Allergies as of 10/10/2021   No Known Allergies      Medication List     STOP taking these medications    aspirin 81 MG tablet   clopidogrel 75 MG tablet Commonly known as: PLAVIX   FreeStyle Libre 2 Sensor Misc   Trelegy Ellipta 100-62.5-25 MCG/ACT Aepb Generic drug: Fluticasone-Umeclidin-Vilant   Tresiba FlexTouch 100 UNIT/ML FlexTouch Pen Generic drug: insulin degludec       TAKE these medications    acetaminophen 325 MG tablet Commonly known as: TYLENOL Take 2 tablets (650 mg total) by mouth every 4 (four) hours as needed for mild pain (or temp > 37.5 C (99.5 F)).   albuterol (2.5 MG/3ML) 0.083% nebulizer solution Commonly known as: PROVENTIL Take 3 mLs (2.5 mg total) by nebulization every 6 (six) hours as needed for wheezing or shortness of breath.   ProAir RespiClick [id number] (90 Base) MCG/ACT Aepb Generic drug: Albuterol Sulfate Inhale 1 puff into the lungs every 6 (six) hours as needed.   amLODipine 5 MG tablet Commonly known as: NORVASC Take 1 tablet (5 mg total) by mouth daily.   atorvastatin 80 MG tablet Commonly known as: LIPITOR TAKE 1 TABLET BY MOUTH EVERY DAY   butalbital-acetaminophen-caffeine 50-325-40 MG tablet Commonly known as: FIORICET Take 1 tablet by mouth every 8 (eight) hours as needed for headache.   carvedilol 25 MG tablet Commonly known as: COREG Take 1 tablet (25 mg total) by mouth 2 (two) times daily with a meal. What changed:  medication strength how much  to take   Entresto 49-51 MG Generic drug: sacubitril-valsartan TAKE 1 TABLET BY MOUTH 2 (TWO) TIMES DAILY. STOP LISINOPRIL AND POTASSIUM   escitalopram 10 MG tablet Commonly known as: LEXAPRO TAKE 1 TABLET BY MOUTH EVERY DAY   fluticasone-salmeterol 250-50 MCG/ACT Aepb Commonly known as: ADVAIR Inhale 1 puff into the lungs 2 (two) times daily.   furosemide 20 MG tablet Commonly known as: LASIX TAKE 1 TABLET (20 MG) BY MOUTH ONCE EVERY OTHER DAY   gabapentin 300 MG capsule Commonly known as: Neurontin Take 1 capsule (300 mg total) by mouth at bedtime.   insulin aspart 100 UNIT/ML injection Commonly known as: novoLOG Inject 0-15 Units into the skin 4 (four) times daily -  before meals and at bedtime. CBG < 70: Implement Hypoglycemia Standing Orders and refer to Hypoglycemia Standing Orders sidebar report CBG 70 - 120: 0 units CBG 121 - 150: 2 units CBG 151 - 200: 3 units CBG 201 - 250: 5 units CBG 251 - 300: 8 units CBG 301 - 350: 11 units CBG 351 - 400: 15 units CBG > 400: call MD   isosorbide mononitrate 30 MG 24 hr tablet Commonly known as: IMDUR TAKE 1 TABLET BY MOUTH EVERY DAY   Jardiance 25 MG Tabs tablet Generic drug: empagliflozin TAKE 1 TABLET  BY MOUTH DAILY   metFORMIN 500 MG 24 hr tablet Commonly known as: GLUCOPHAGE-XR Take 1 tablet (500 mg total) by mouth 2 (two) times daily with a meal. What changed: when to take this   spironolactone 25 MG tablet Commonly known as: ALDACTONE Take 1 tablet (25 mg total) by mouth daily.   tamsulosin 0.4 MG Caps capsule Commonly known as: FLOMAX Take 1 capsule (0.4 mg total) by mouth daily.   traMADol 50 MG tablet Commonly known as: ULTRAM Take 1 tablet (50 mg total) by mouth every 6 (six) hours as needed for up to 5 days for moderate pain (back pain).   Trulicity 4.5 0000000 Sopn Generic drug: Dulaglutide INJECT 4.5 MG AS DIRECTED ONCE A WEEK.        Follow-up Information     [facility].  Schedule an appointment as soon as possible for a visit in 1 month(s).   Specialty: Neurology Why: stroke clinic Contact information: [place] [place] [phone]               No Known Allergies  Consultations: Neurology Interventional radiology   Procedures/Studies: CT ANGIO HEAD W OR WO CONTRAST  Result Date: 10/02/2021 CLINICAL DATA:  Hemorrhagic stroke, follow-up EXAM: CT ANGIOGRAPHY HEAD AND NECK TECHNIQUE: Multidetector CT imaging of the head and neck was performed using the standard protocol during bolus administration of intravenous contrast. Multiplanar CT image reconstructions and MIPs were obtained to evaluate the vascular anatomy. Carotid stenosis measurements (when applicable) are obtained utilizing NASCET criteria, using the distal internal carotid diameter as the denominator. RADIATION DOSE REDUCTION: This exam was performed according to the departmental dose-optimization program which includes automated exposure control, adjustment of the mA and/or kV according to patient size and/or use of iterative reconstruction technique. CONTRAST:  32mL OMNIPAQUE IOHEXOL 350 MG/ML SOLN COMPARISON:  Brain MRI obtained earlier the same day, CT head dated 1 day prior FINDINGS: CTA NECK FINDINGS Aortic arch: There is mild calcified atherosclerotic plaque of the aortic arch. The origins of the major branch vessels are patent. The subclavian arteries are patent to the level imaged. There is a small outpouching likely reflecting a penetrating atherosclerotic ulcer along the posteromedial margin of the aortic arch [id number] Right carotid system: The right common carotid artery is patent with mild soft and calcified plaque. There is calcified atherosclerotic plaque at the bifurcation without hemodynamically significant stenosis or occlusion. The internal and external carotid arteries are patent. There is no dissection or aneurysm. The right internal  carotid artery has a partially medialized/retropharyngeal course. Left carotid system: The left common carotid artery is patent with scattered soft and calcified plaque. There is mixed plaque in the proximal left ICA resulting in less than 50% stenosis the distal left internal carotid artery is patent. The left external carotid artery is patent. Vertebral arteries: The right vertebral artery is patent at its origin. There is severe stenosis in the V1 segment primarily due to soft plaque (9-277). There is reconstitution of flow in the distal V1 segment. There is scattered plaque throughout the remainder of the vertebral artery resulting in additional moderate to severe stenoses at the C5-C6 and C4-C5 levels (9-244, 9-241, 10-127). There is severe stenosis at the origin of the left vertebral artery (10-140). The left vertebral artery is otherwise patent. There is no evidence of dissection or aneurysm. Skeleton: There is mild multilevel degenerative change of the cervical spine. There is no acute osseous abnormality or aggressive osseous lesion. Sclerotic  lesions in the right and left frontal calvarium for present in 2018. Other neck: The soft tissues are unremarkable. Upper chest: There is emphysema and scarring in the lung apices. Review of the MIP images confirms the above findings CTA HEAD FINDINGS Anterior circulation: Is calcified atherosclerotic plaque in the bilateral intracranial ICAs resulting in up to mild stenosis on the right The bilateral MCAs are patent. The bilateral ACAs are patent. The anterior communicating artery is normal. There is no aneurysm or AVM. Posterior circulation: There is mild stenosis of the right vertebral artery at the PICA origin. The right vertebral artery is otherwise patent through the basilar artery. The left vertebral artery is occluded after the PICA origin. The basilar artery is patent with mild multifocal irregularity but no high-grade stenosis or occlusion. There are  prominent bilateral posterior communicating arteries which primarily supply the posterior cerebral arteries, with small P1 segments bilaterally. The bilateral PCAs are patent There is no aneurysm or AVM. Venous sinuses: Not well evaluated due to bolus timing. Anatomic variants: Above. The intraparenchymal and intraventricular hemorrhage appears similar to the prior study from 1 day prior. The ventricles are stable in size. Review of the MIP images confirms the above findings IMPRESSION: 1. No culprit aneurysm or AVM identified. 2. Multifocal stenosis of the right vertebral artery with severe stenoses at the V1 and V2 segments. 3. Occluded left V4 segment after the PICA origin, likely chronic. Otherwise, patent intracranial vasculature with the PCAs primarily supplied by prominent posterior communicating arteries. 4. Mild calcified atherosclerotic plaque at the carotid bifurcations and intracranial ICAs without hemodynamically significant stenosis or occlusion. 5. Small probable penetrating atherosclerotic ulcer along the posteromedial margin of the aortic arch. Aortic Atherosclerosis (ICD10-I70.0) and Emphysema (ICD10-J43.9). Electronically Signed   By: Valetta Mole M.D.   On: 10/02/2021 12:00   DG Abd 1 View  Result Date: 10/03/2021 CLINICAL DATA:  NG tube placement EXAM: ABDOMEN - 1 VIEW COMPARISON:  10/01/2021 FINDINGS: Bowel gas pattern is nonspecific. No abnormal masses or calcifications are seen. Kidneys are partly obscured by bowel contents. Pelvis is not included in the images. Tip of enteric tube is seen in the distal thoracic esophagus. There are linear densities in the mid and lower lung fields. Emphysematous changes are noted in the apical regions of both lungs. Costophrenic angles are clear. IMPRESSION: Tip of enteric tube is seen in the lower thoracic esophagus and should be advanced 10-15 cm to place the tip and side port within the stomach. Bowel gas pattern is nonspecific. There are increased  markings in both mid and both lower lung fields suggesting atelectasis/pneumonia. Part of this finding may suggest underlying scarring. Electronically Signed   By: Elmer Picker M.D.   On: 10/03/2021 10:59   DG Abd 1 View  Result Date: 10/01/2021 CLINICAL DATA:  Abdominal distention, CVA EXAM: ABDOMEN - 1 VIEW COMPARISON:  None. FINDINGS: Bowel gas pattern is nonspecific. Small amount of stool is seen in colon. No abnormal masses or calcifications are seen. Kidneys are partly obscured by bowel contents. Degenerative changes are noted in the lumbar spine. Arterial calcifications are seen. Vascular stent is seen in the left femoral artery. IMPRESSION: Nonspecific bowel gas pattern. Electronically Signed   By: Elmer Picker M.D.   On: 10/01/2021 14:36   CT HEAD WO CONTRAST (5MM)  Result Date: 10/07/2021 CLINICAL DATA:  Intracranial hemorrhage follow up EXAM: CT HEAD WITHOUT CONTRAST TECHNIQUE: Contiguous axial images were obtained from the base of the skull through the vertex without intravenous  contrast. RADIATION DOSE REDUCTION: This exam was performed according to the departmental dose-optimization program which includes automated exposure control, adjustment of the mA and/or kV according to patient size and/or use of iterative reconstruction technique. COMPARISON:  10/02/2021 FINDINGS: Brain: Near resolution of intraparenchymal component right basal ganglia hemorrhage. Intraventricular blood has slightly decreased. Mild improvement in the size of ventricles. No new site of hemorrhage. No midline shift or other mass effect. Vascular: ICA calcification Skull: Negative Sinuses/Orbits: Normal Other: None IMPRESSION: 1. Near resolution of intraparenchymal component right basal ganglia hemorrhage with slightly decreased intraventricular blood. 2. Mild improvement in the size of ventricles. Electronically Signed   By: Ulyses Jarred M.D.   On: 10/07/2021 19:42   CT HEAD WO CONTRAST (5MM)  Result  Date: 10/01/2021 CLINICAL DATA:  Follow-up intracranial hemorrhage EXAM: CT HEAD WITHOUT CONTRAST TECHNIQUE: Contiguous axial images were obtained from the base of the skull through the vertex without intravenous contrast. RADIATION DOSE REDUCTION: This exam was performed according to the departmental dose-optimization program which includes automated exposure control, adjustment of the mA and/or kV according to patient size and/or use of iterative reconstruction technique. COMPARISON:  Yesterday FINDINGS: Brain: Right corona radiata hemorrhage with extensive intraventricular extension, parenchymal component measuring 15 mm. Clot continues to the level of the lower fourth ventricle. Lateral ventricular dilatation measures a few mm less than before and there is less rounding of the lateral ventricles on coronal reformats. Stable periventricular low-density. Negative for cortical infarct. Retro cerebellar CSF accumulation, incidental. Vascular: No hyperdense vessel or unexpected calcification. Skull: Normal. Negative for fracture or focal lesion. Sinuses/Orbits: No acute finding. IMPRESSION: Unchanged parenchymal and intraventricular hemorrhage. Lateral ventriculomegaly is mildly improved. Electronically Signed   By: Jorje Guild M.D.   On: 10/01/2021 09:42   CT HEAD WO CONTRAST (5MM)  Result Date: 09/30/2021 CLINICAL DATA:  Hemorrhagic stroke, follow-up EXAM: CT HEAD WITHOUT CONTRAST TECHNIQUE: Contiguous axial images were obtained from the base of the skull through the vertex without intravenous contrast. RADIATION DOSE REDUCTION: This exam was performed according to the departmental dose-optimization program which includes automated exposure control, adjustment of the mA and/or kV according to patient size and/or use of iterative reconstruction technique. COMPARISON:  Earlier same day FINDINGS: Brain: Similar area of parenchymal hemorrhage within the periventricular white matter along the body of the right  lateral ventricle. Significant intraventricular hemorrhage is again identified. Stable hydrocephalus. No new hemorrhage. No new loss of gray-white differentiation. Stable findings of probable chronic microvascular ischemic changes in the cerebral white matter. 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 Vascular: No new abnormality. Skull: Calvarium is unremarkable. Sinuses/Orbits: No acute finding. Other: None. IMPRESSION: No significant change in small parenchymal hemorrhage adjacent to the body of the right lateral ventricle. Similar intraventricular hemorrhage and mild communicating hydrocephalus. Electronically Signed   By: Macy Mis M.D.   On: 09/30/2021 19:49   CT HEAD WO CONTRAST (5MM)  Result Date: 09/30/2021 CLINICAL DATA:  Head trauma, minor (Age >= 65y) EXAM: CT HEAD WITHOUT CONTRAST TECHNIQUE: Contiguous axial images were obtained from the base of the skull through the vertex without intravenous contrast. RADIATION DOSE REDUCTION: This exam was performed according to the departmental dose-optimization program which includes automated exposure control, adjustment of the mA and/or kV according to patient size and/or use of iterative reconstruction technique. COMPARISON:  11/28/2020 FINDINGS: Brain: Approximately 8 x 11 x 12 mm focus of hemorrhage within the periventricular white matter along the body of the right lateral ventricle. There is significant intraventricular hemorrhage  primarily within the right lateral ventricle but also dependent left lateral ventricle, third ventricle, and fourth ventricle. Caliber of the ventricles is increased reflecting hydrocephalus. Gray-white differentiation is preserved. Patchy hypoattenuation in the supratentorial white matter is nonspecific but probably reflects similar chronic microvascular ischemic changes. There is a mega cisterna magna. Vascular: There is atherosclerotic calcification at the skull base. Skull: Calvarium is unremarkable.  Sinuses/Orbits: No acute finding. Other: None. IMPRESSION: 12 mm focus of acute hemorrhage within the periventricular white matter adjacent to the body of the right lateral ventricle. Intraventricular hemorrhage appears to reflect extension of this hematoma. There is resulting mild communicating hydrocephalus. These results were called by telephone at the time of interpretation on 09/30/2021 at 3:30 pm to provider Merlyn Lot , who verbally acknowledged these results. Electronically Signed   By: Macy Mis M.D.   On: 09/30/2021 15:32   CT ANGIO NECK W OR WO CONTRAST  Result Date: 10/02/2021 CLINICAL DATA:  Hemorrhagic stroke, follow-up EXAM: CT ANGIOGRAPHY HEAD AND NECK TECHNIQUE: Multidetector CT imaging of the head and neck was performed using the standard protocol during bolus administration of intravenous contrast. Multiplanar CT image reconstructions and MIPs were obtained to evaluate the vascular anatomy. Carotid stenosis measurements (when applicable) are obtained utilizing NASCET criteria, using the distal internal carotid diameter as the denominator. RADIATION DOSE REDUCTION: This exam was performed according to the departmental dose-optimization program which includes automated exposure control, adjustment of the mA and/or kV according to patient size and/or use of iterative reconstruction technique. CONTRAST:  44mL OMNIPAQUE IOHEXOL 350 MG/ML SOLN COMPARISON:  Brain MRI obtained earlier the same day, CT head dated 1 day prior FINDINGS: CTA NECK FINDINGS Aortic arch: There is mild calcified atherosclerotic plaque of the aortic arch. The origins of the major branch vessels are patent. The subclavian arteries are patent to the level imaged. There is a small outpouching likely reflecting a penetrating atherosclerotic ulcer along the posteromedial margin of the aortic arch [id number] Right carotid system: The right common carotid artery is patent with mild soft and calcified plaque. There is  calcified atherosclerotic plaque at the bifurcation without hemodynamically significant stenosis or occlusion. The internal and external carotid arteries are patent. There is no dissection or aneurysm. The right internal carotid artery has a partially medialized/retropharyngeal course. Left carotid system: The left common carotid artery is patent with scattered soft and calcified plaque. There is mixed plaque in the proximal left ICA resulting in less than 50% stenosis the distal left internal carotid artery is patent. The left external carotid artery is patent. Vertebral arteries: The right vertebral artery is patent at its origin. There is severe stenosis in the V1 segment primarily due to soft plaque (9-277). There is reconstitution of flow in the distal V1 segment. There is scattered plaque throughout the remainder of the vertebral artery resulting in additional moderate to severe stenoses at the C5-C6 and C4-C5 levels (9-244, 9-241, 10-127). There is severe stenosis at the origin of the left vertebral artery (10-140). The left vertebral artery is otherwise patent. There is no evidence of dissection or aneurysm. Skeleton: There is mild multilevel degenerative change of the cervical spine. There is no acute osseous abnormality or aggressive osseous lesion. Sclerotic lesions in the right and left frontal calvarium for present in 2018. Other neck: The soft tissues are unremarkable. Upper chest: There is emphysema and scarring in the lung apices. Review of the MIP images confirms the above findings CTA HEAD FINDINGS Anterior circulation: Is calcified atherosclerotic plaque in the  bilateral intracranial ICAs resulting in up to mild stenosis on the right The bilateral MCAs are patent. The bilateral ACAs are patent. The anterior communicating artery is normal. There is no aneurysm or AVM. Posterior circulation: There is mild stenosis of the right vertebral artery at the PICA origin. The right vertebral artery is  otherwise patent through the basilar artery. The left vertebral artery is occluded after the PICA origin. The basilar artery is patent with mild multifocal irregularity but no high-grade stenosis or occlusion. There are prominent bilateral posterior communicating arteries which primarily supply the posterior cerebral arteries, with small P1 segments bilaterally. The bilateral PCAs are patent There is no aneurysm or AVM. Venous sinuses: Not well evaluated due to bolus timing. Anatomic variants: Above. The intraparenchymal and intraventricular hemorrhage appears similar to the prior study from 1 day prior. The ventricles are stable in size. Review of the MIP images confirms the above findings IMPRESSION: 1. No culprit aneurysm or AVM identified. 2. Multifocal stenosis of the right vertebral artery with severe stenoses at the V1 and V2 segments. 3. Occluded left V4 segment after the PICA origin, likely chronic. Otherwise, patent intracranial vasculature with the PCAs primarily supplied by prominent posterior communicating arteries. 4. Mild calcified atherosclerotic plaque at the carotid bifurcations and intracranial ICAs without hemodynamically significant stenosis or occlusion. 5. Small probable penetrating atherosclerotic ulcer along the posteromedial margin of the aortic arch. Aortic Atherosclerosis (ICD10-I70.0) and Emphysema (ICD10-J43.9). Electronically Signed   By: Valetta Mole M.D.   On: 10/02/2021 12:00   CT Cervical Spine Wo Contrast  Result Date: 09/30/2021 CLINICAL DATA:  Neck trauma (Age >= 65y) EXAM: CT CERVICAL SPINE WITHOUT CONTRAST TECHNIQUE: Multidetector CT imaging of the cervical spine was performed without intravenous contrast. Multiplanar CT image reconstructions were also generated. RADIATION DOSE REDUCTION: This exam was performed according to the departmental dose-optimization program which includes automated exposure control, adjustment of the mA and/or kV according to patient size and/or  use of iterative reconstruction technique. COMPARISON:  11/28/2020 FINDINGS: Alignment: Stable. Skull base and vertebrae: No acute fracture. Vertebral body heights are maintained. Soft tissues and spinal canal: No prevertebral fluid or swelling. No visible canal hematoma. Disc levels:  Mild multilevel degenerative changes. Upper chest: Bullous emphysema. Other: Calcified plaque at the common carotid bifurcations. IMPRESSION: No acute cervical spine fracture. Electronically Signed   By: Macy Mis M.D.   On: 09/30/2021 16:04   MR BRAIN WO CONTRAST  Result Date: 10/02/2021 CLINICAL DATA:  Hemorrhagic stroke EXAM: MRI HEAD WITHOUT CONTRAST TECHNIQUE: Multiplanar, multiecho pulse sequences of the brain and surrounding structures were obtained without intravenous contrast. COMPARISON:  Head CT from yesterday FINDINGS: Brain: Punctate acute infarct along the high right frontal cortex. Limited assessment of diffusion adjacent to the right corona radiata and intraventricular hemorrhage given susceptibility affects. A small white matter infarct could be obscured at this level. Subacute hemorrhage with methemoglobin showing is similar distribution of prior CT, seen in the lateral ventricles to fourth ventricle with lateral ventriculomegaly. Non worrisome retro cerebellar CSF collection. FLAIR hyperintensity in the periventricular white matter at least partially from chronic small vessel ischemia with chronic lacune is at the bilateral thalamus small volume subarachnoid hemorrhage is likely along the left parietal convexity, attributed to a clearing and redistribution. Non worrisome retro cerebellar CSF collection. Vascular: No visible flow in the right vertebral or basilar arteries until the distal basilar. There appears to be fetal type bilateral PCA flow. Case discussed with Dr. Rory Percy via telephone. Skull and upper cervical  spine: Normal marrow signal. Sinuses/Orbits: Negative. IMPRESSION: 1. Absent flow void in the  right vertebral and and proximal basilar artery with distal reconstitution. Fetal type appearing PCAs. This may be chronic given the clinical circumstances and lack of posterior circulation ischemia. 2. Punctate acute infarct in the right frontal cortex. Limited diffusion assessment adjacent to the right corona radiata hemorrhage due to susceptibility artifact. White matter infarct could be obscured at this level. 3. Unchanged hemorrhagic volume and ventriculomegaly. Electronically Signed   By: Jorje Guild M.D.   On: 10/02/2021 04:56   DG Chest Portable 1 View  Result Date: 09/30/2021 CLINICAL DATA:  Fall out of bed today. COPD. Congestive heart failure. EXAM: PORTABLE CHEST 1 VIEW COMPARISON:  08/15/2021 FINDINGS: Heart size is within normal limits allowing for portable technique. Decreased lung volumes are seen. Increased opacity is seen in the left lower lung along the left heart border, suspicious for infiltrate/pneumonia. No evidence of pleural effusion. IMPRESSION: Suspect left lower lobe infiltrate/pneumonia. Recommend follow-up with two-view chest radiograph when patient is able. Electronically Signed   By: Marlaine Hind M.D.   On: 09/30/2021 15:42   DG Abd Portable 1V  Result Date: 10/03/2021 CLINICAL DATA:  OG tube placement EXAM: PORTABLE ABDOMEN - 1 VIEW COMPARISON:  10/03/2021 FINDINGS: OG tube tip is in the proximal stomach. The side port is in the distal esophagus. IMPRESSION: OG tube tip in the proximal stomach. Electronically Signed   By: Rolm Baptise M.D.   On: 10/03/2021 22:27   DG Swallowing Func-Speech Pathology  Result Date: 10/06/2021 Table formatting from the original result was not included. Objective Swallowing Evaluation: Type of Study: MBS-Modified Barium Swallow Study  Completed by Vaughan Sine, SLP Student Supervised and reviewed by Herbie Baltimore MA CCC-SLP Patient Details Name: Danny Estimable Gonzalo Sr. MRN: [id number] Date of Birth: 1951/07/17 Today's Date: 10/06/2021 Time: SLP  Start Time (ACUTE ONLY): 23 -SLP Stop Time (ACUTE ONLY): 1355 SLP Time Calculation (min) (ACUTE ONLY): 20 min Past Medical History: Past Medical History: Diagnosis Date  CAD (coronary artery disease)   a. 01/2010 PCI of LAD; b. 09/2014 PCI/DES of mLAD due to ISR. D1 80, D2 80(jailed), LCX 70m; c. 07/2016 NSTEMI/Cath: LM nl, LAD 81m ISR, 40d, D1 90ost, 80p, D2 90ost, RI min irregs, LCX min irregs, OM1 90 small, OM2/3 min irregs, RCA min irregs, RPLB1 90, EF 25-35%.  Chronic combined systolic and diastolic CHF (congestive heart failure) ([place])   a. 07/2016 Echo: EF 30%, severe septal/anterior HK, Gr1 DD.  CKD (chronic kidney disease), stage III (HCC)   COPD (chronic obstructive pulmonary disease) (HCC)   DM2 (diabetes mellitus, type 2) (HCC)   Erectile dysfunction   HLD (hyperlipidemia)   HTN (hypertension)   Ischemic cardiomyopathy   a. 07/2016 Echo: EF 30% w/ sev septal/ant HK. Gr1 DD. Past Surgical History: Past Surgical History: Procedure Laterality Date  AMPUTATION Left 11/10/2020  Procedure: AMPUTATION BELOW KNEE;  Surgeon: [name], MD;  Location: [facility];  Service: General;  Laterality: Left;  BELOW KNEE LEG AMPUTATION    CAD: stent to the LAD    CARDIAC CATHETERIZATION  10/01/2014  CARDIAC CATHETERIZATION N/A 07/23/2016  Procedure: Left Heart Cath and Coronary Angiography;  Surgeon: [name], MD;  Location: [facility];  Service: Cardiovascular;  Laterality: N/A;  CORONARY ANGIOPLASTY WITH STENT PLACEMENT  10/01/2014  LOWER EXTREMITY ANGIOGRAPHY Left 10/31/2020  Procedure: LOWER EXTREMITY ANGIOGRAPHY;  Surgeon: [name], MD;  Location: [facility];  Service:  Cardiovascular;  Laterality: Left; HPI: Patient is a 71 y.o. male with PMH: HTN, HLD, dementia, COPD, HLD, CAD, DM-2. Typically he has supervision and assistance from his son, but unfortunately his son was admitted and continues to be admitted at hospital after suffering intracranial hemorrhage. Patient admitted to hospital  after a family member who was checking on him found him down on the ground and more confused than usual. Family member brought him to [facility] for evaluation and CT head revealed [place] with intraventricular extension. Patient was then transferred to [facility].  Subjective: pleasant, alert, asking about his phone wanting to call his son  Recommendations for follow up therapy are one component of a multi-disciplinary discharge planning process, led by the attending physician.  Recommendations may be updated based on patient status, additional functional criteria and insurance authorization. Assessment / Plan / Recommendation Clinical Impressions 10/06/2021 Clinical Impression Patient was seen for MBS. Pt demonstrates a mild to moderate primary oropharyngeal dysphagia with a moderate aspiration risk. Patient has baseline cognitive-linguistic deficits leading to further aspiration concerns. Oral phase of swallowing impaired as evidenced by lingual residue, delayed oral transit, tongue thrusting, and reduced tongue to palate contact. Mastication slightly prolonged possibly 2/2 patient's inadequate dentition and not having dentures. Pharyngeal swallow initiation occurred at the level of the valleculae, which is WNL. Adequate laryngeal elevation and anterior movement observed. Epiglottic deflection was efficienct. Patient penetrated with thin liquids via straw cup. However, he spontaneously and independently cleared penetrates. Residuals observed in the valleculae and posterior pharynx; pt independently and spontaneously cleared residuals with subsequent swallow. With mixed consistency of pill and thin liquid, patient aspirated thin liquid and was unable to clear aspirate despite efforts. Patient's aspiration is a result of piecemeal swallow sequence and delayed swallow initiation to the level of the pyriforms. SLP recommends initiating a Dys 3 Diet with Thin Liquids. Medications should be whole in puree at this time. In future  sessions, treatment should focus on slow rate with small bites/sips facilitated to patient to aid in timing of pharyngeal swallow sequence. SLP Visit Diagnosis Dysphagia, oropharyngeal phase (R13.12) Attention and concentration deficit following -- Frontal lobe and executive function deficit following -- Impact on safety and function Moderate aspiration risk   Treatment Recommendations 10/06/2021 Treatment Recommendations Therapy as outlined in treatment plan below   Prognosis 10/06/2021 Prognosis for Safe Diet Advancement Good Barriers to Reach Goals Cognitive deficits Barriers/Prognosis Comment -- Diet Recommendations 10/06/2021 SLP Diet Recommendations Dysphagia 3 (Mech soft) solids;Thin liquid Liquid Administration via Straw Medication Administration Crushed with puree Compensations Minimize environmental distractions;Slow rate;Small sips/bites Postural Changes Seated upright at 90 degrees   Other Recommendations 10/06/2021 Recommended Consults -- Oral Care Recommendations Oral care BID Other Recommendations -- Follow Up Recommendations Skilled nursing-short term rehab (<3 hours/day) Assistance recommended at discharge Frequent or constant Supervision/Assistance Functional Status Assessment Patient has had a recent decline in their functional status and demonstrates the ability to make significant improvements in function in a reasonable and predictable amount of time. Frequency and Duration  10/06/2021 Speech Therapy Frequency (ACUTE ONLY) min 1 x/week Treatment Duration 2 weeks   Oral Phase 10/06/2021 Oral Phase Impaired Oral - Pudding Teaspoon -- Oral - Pudding Cup -- Oral - Honey Teaspoon -- Oral - Honey Cup -- Oral - Nectar Teaspoon -- Oral - Nectar Cup -- Oral - Nectar Straw -- Oral - Thin Teaspoon NT Oral - Thin Cup NT Oral - Thin Straw Lingual/palatal residue Oral - Puree Lingual pumping;Incomplete tongue to palate contact;Lingual/palatal residue Oral -  Mech Soft NT Oral - Regular Lingual pumping;Lingual/palatal  residue;Incomplete tongue to palate contact Oral - Multi-Consistency NT Oral - Pill Lingual/palatal residue Oral Phase - Comment --  Pharyngeal Phase 10/06/2021 Pharyngeal Phase Impaired Pharyngeal- Pudding Teaspoon -- Pharyngeal -- Pharyngeal- Pudding Cup -- Pharyngeal -- Pharyngeal- Honey Teaspoon -- Pharyngeal -- Pharyngeal- Honey Cup -- Pharyngeal -- Pharyngeal- Nectar Teaspoon -- Pharyngeal -- Pharyngeal- Nectar Cup -- Pharyngeal -- Pharyngeal- Nectar Straw -- Pharyngeal -- Pharyngeal- Thin Teaspoon NT Pharyngeal -- Pharyngeal- Thin Cup NT Pharyngeal -- Pharyngeal- Thin Straw Delayed swallow initiation-vallecula;Pharyngeal residue - valleculae;Pharyngeal residue - posterior pharnyx;Reduced tongue base retraction Pharyngeal Material enters airway, remains ABOVE vocal cords then ejected out Pharyngeal- Puree Delayed swallow initiation-vallecula;Reduced tongue base retraction;Pharyngeal residue - valleculae;Pharyngeal residue - posterior pharnyx Pharyngeal Material does not enter airway Pharyngeal- Mechanical Soft NT Pharyngeal -- Pharyngeal- Regular Delayed swallow initiation-vallecula;Reduced tongue base retraction;Pharyngeal residue - valleculae;Pharyngeal residue - posterior pharnyx Pharyngeal Material does not enter airway Pharyngeal- Multi-consistency NT Pharyngeal -- Pharyngeal- Pill Delayed swallow initiation-vallecula;Penetration/Aspiration before swallow;Penetration/Aspiration during swallow;Moderate aspiration;Pharyngeal residue - valleculae;Pharyngeal residue - posterior pharnyx Pharyngeal Material enters airway, passes BELOW cords and not ejected out despite cough attempt by patient Pharyngeal Comment --  No flowsheet data found. Herbie Baltimore, MA CCC-SLP Acute Rehabilitation Services Pager [phone] Office [phone] Lynann Beaver 10/06/2021, 2:32 PM                     ECHOCARDIOGRAM LIMITED  Result Date: 10/01/2021    ECHOCARDIOGRAM REPORT   Patient Name:   Naeem Bonnar Crosland Sr. Date  of Exam: 10/01/2021 Medical Rec #:  [id number]          Height:       72.0 in Accession #:    [id number]         Weight:       217.4 lb Date of Birth:  1951-02-12          BSA:          2.207 m Patient Age:    13 years           BP:           140/90 mmHg Patient Gender: M                  HR:           92 bpm. Exam Location:  Inpatient Procedure: Limited Echo Indications:     CVA  History:         Patient has prior history of Echocardiogram examinations, most                  recent 12/06/2020. CHF, CAD, COPD; Risk Factors:Diabetes,                  Dyslipidemia and Hypertension. 07/23/2016 cath.  Sonographer:     Luisa Hart RDCS Referring Phys:  [id number] ERIC LINDZEN Diagnosing Phys: Fransico Him MD  Sonographer Comments: Technically difficult study due to poor echo windows and patient is morbidly obese. Image acquisition challenging due to patient body habitus. IMPRESSIONS  1. Very poor images despite use of definity contrast. Left ventricular endocardial border not optimally defined to evaluate regional wall motion. Left ventricular diastolic parameters are indeterminate.  2. Right ventricular systolic function was not well visualized. The right ventricular size is normal. Tricuspid regurgitation signal is inadequate for assessing PA pressure.  3. The mitral valve was not well visualized. No evidence of mitral valve regurgitation. No evidence  of mitral stenosis.  4. The aortic valve was not well visualized. Aortic valve regurgitation is not visualized. No aortic stenosis is present. Aortic valve area, by VTI measures 3.44 cm. Aortic valve mean gradient measures 2.0 mmHg.  5. The inferior vena cava is normal in size with greater than 50% respiratory variability, suggesting right atrial pressure of 3 mmHg.  6. There is a trivial pericardial effusion anterior to the right ventricle. FINDINGS  Left Ventricle: Very poor images despite use of definity contrast. Left ventricular endocardial border not optimally defined to  evaluate regional wall motion. Definity contrast agent was given IV to delineate the left ventricular endocardial borders. The left ventricular internal cavity size was normal in size. There is  no left ventricular hypertrophy. Left ventricular diastolic parameters are indeterminate. Normal left ventricular filling pressure. Right Ventricle: The right ventricular size is normal. No increase in right ventricular wall thickness. Right ventricular systolic function was not well visualized. Tricuspid regurgitation signal is inadequate for assessing PA pressure. Left Atrium: Left atrial size was not assessed. Right Atrium: Right atrial size was not assessed. Pericardium: Trivial pericardial effusion is present. The pericardial effusion is anterior to the right ventricle. Mitral Valve: The mitral valve was not well visualized. No evidence of mitral valve regurgitation. No evidence of mitral valve stenosis. Tricuspid Valve: The tricuspid valve is normal in structure. Tricuspid valve regurgitation is trivial. No evidence of tricuspid stenosis. Aortic Valve: The aortic valve was not well visualized. Aortic valve regurgitation is not visualized. No aortic stenosis is present. Aortic valve mean gradient measures 2.0 mmHg. Aortic valve peak gradient measures 3.3 mmHg. Aortic valve area, by VTI measures 3.44 cm. Pulmonic Valve: The pulmonic valve was not well visualized. Pulmonic valve regurgitation is not visualized. No evidence of pulmonic stenosis. Aorta: The aortic root was not well visualized. Venous: The inferior vena cava is normal in size with greater than 50% respiratory variability, suggesting right atrial pressure of 3 mmHg. IAS/Shunts: No atrial level shunt detected by color flow Doppler.  LEFT VENTRICLE PLAX 2D LVIDd:         5.20 cm   Diastology LVIDs:         3.90 cm   LV e' medial:    3.92 cm/s LV PW:         0.90 cm   LV E/e' medial:  6.1 LV IVS:        1.00 cm   LV e' lateral:   4.30 cm/s LVOT diam:     2.30  cm   LV E/e' lateral: 5.5 LV SV:         50 LV SV Index:   23 LVOT Area:     4.15 cm  LEFT ATRIUM         Index LA diam:    2.50 cm 1.13 cm/m  AORTIC VALVE                    PULMONIC VALVE AV Area (Vmax):    3.61 cm     PV Vmax:       0.74 m/s AV Area (Vmean):   3.45 cm     PV Vmean:      48.667 cm/s AV Area (VTI):     3.44 cm     PV VTI:        0.111 m AV Vmax:           90.90 cm/s   PV Peak grad:  2.2 mmHg AV Vmean:  59.000 cm/s  PV Mean grad:  1.0 mmHg AV VTI:            0.145 m AV Peak Grad:      3.3 mmHg AV Mean Grad:      2.0 mmHg LVOT Vmax:         79.00 cm/s LVOT Vmean:        49.000 cm/s LVOT VTI:          0.120 m LVOT/AV VTI ratio: 0.83  AORTA Ao Root diam: 2.70 cm MITRAL VALVE MV Area (PHT): 5.97 cm    SHUNTS MV Decel Time: 127 msec    Systemic VTI:  0.12 m MV E velocity: 23.80 cm/s  Systemic Diam: 2.30 cm MV A velocity: 59.20 cm/s MV E/A ratio:  0.40 Fransico Him MD Electronically signed by Fransico Him MD Signature Date/Time: 10/01/2021/4:55:22 PM    Final (Updated)    (Echo, Carotid, EGD, Colonoscopy, ERCP)    Subjective: Patient seen and examined.  He tells me he is having ongoing mild headache.  Does not feel very well.  Wants to lay in the bed because does not feel well to get out of the bed.  Blood pressures are acceptable.  Blood sugars are acceptable. Not very interactive and interested to keep up conversation today. Patient's daughter reported cognition issues and forgetfulness.   Discharge Exam: Vitals:   10/10/21 0430 10/10/21 0826  BP: (!) 142/81 (!) 145/98  Pulse: 73 81  Resp: (!) 21 20  Temp: 97.9 F (36.6 C) 98.2 F (36.8 C)  SpO2: 95% 96%   Vitals:   10/09/21 1952 10/09/21 2337 10/10/21 0430 10/10/21 0826  BP: 127/82 112/75 (!) 142/81 (!) 145/98  Pulse: 81 83 73 81  Resp: 19 16 (!) 21 20  Temp: 97.7 F (36.5 C) 98.1 F (36.7 C) 97.9 F (36.6 C) 98.2 F (36.8 C)  TempSrc: Oral Oral Oral Oral  SpO2: 94% 94% 95% 96%  Weight:      Height:         General: Pt is sleepy.  Arouses to conversation, answers simple questions and then goes back to sleep. Cardiovascular: RRR, S1/S2 +, no rubs, no gallops Respiratory: CTA bilaterally, no wheezing, no rhonchi Abdominal: Soft, NT, ND, bowel sounds + Extremities: no edema, no cyanosis, left BKA stump.    The results of significant diagnostics from this hospitalization (including imaging, microbiology, ancillary and laboratory) are listed below for reference.     Microbiology: Recent Results (from the past 240 hour(s))  MRSA Next Gen by PCR, Nasal     Status: None   Collection Time: 09/30/21  7:25 PM   Specimen: Nasal Mucosa; Nasal Swab  Result Value Ref Range Status   MRSA by PCR Next Gen NOT DETECTED NOT DETECTED Final    Comment: (NOTE) The GeneXpert MRSA Assay (FDA approved for NASAL specimens only), is one component of a comprehensive MRSA colonization surveillance program. It is not intended to diagnose MRSA infection nor to guide or monitor treatment for MRSA infections. Test performance is not FDA approved in patients less than 61 years old. Performed at [facility], [place] [address] [place],  [address]      Labs: BNP (last 3 results) Recent Labs    10/26/20 1048 08/15/21 1517 09/30/21 1509  BNP 94.0 239.9* [id number]*   Basic Metabolic Panel: Recent Labs  Lab 10/04/21 0758 10/05/21 0517 10/06/21 0355 10/07/21 0048 10/10/21 0118  NA 141 138 138 137 134*  K 4.1  4.0 4.5 4.0 3.8  CL 111 109 106 104 102  CO2 20* 22 23 25 24   GLUCOSE 139* 119* 126* 144* 124*  BUN 44* 29* 21 25* 27*  CREATININE 1.39* 1.08 0.93 1.07 1.21  CALCIUM 8.7* 8.6* 8.9 8.9 8.9  MG  --   --   --   --  2.2   Liver Function Tests: No results for input(s): AST, ALT, ALKPHOS, BILITOT, PROT, ALBUMIN in the last 168 hours. No results for input(s): LIPASE, AMYLASE in the last 168 hours. No results for input(s): AMMONIA in the last 168 hours. CBC: Recent Labs  Lab 10/04/21 0758  10/05/21 0517 10/06/21 0355 10/07/21 0048 10/10/21 0118  WBC 10.0 9.5 8.6 9.2 10.1  HGB 16.1 15.0 15.2 15.0 14.2  HCT 49.2 46.0 47.7 45.5 43.2  MCV 94.3 94.5 94.5 93.0 93.7  PLT 235 211 227 243 266   Cardiac Enzymes: No results for input(s): CKTOTAL, CKMB, CKMBINDEX, TROPONINI in the last 168 hours. BNP: Invalid input(s): POCBNP CBG: Recent Labs  Lab 10/09/21 1545 10/09/21 1953 10/09/21 2336 10/10/21 0429 10/10/21 0757  GLUCAP 162* 206* 150* 122* 113*   D-Dimer No results for input(s): DDIMER in the last 72 hours. Hgb A1c No results for input(s): HGBA1C in the last 72 hours. Lipid Profile No results for input(s): CHOL, HDL, LDLCALC, TRIG, CHOLHDL, LDLDIRECT in the last 72 hours. Thyroid function studies No results for input(s): TSH, T4TOTAL, T3FREE, THYROIDAB in the last 72 hours.  Invalid input(s): FREET3 Anemia work up No results for input(s): VITAMINB12, FOLATE, FERRITIN, TIBC, IRON, RETICCTPCT in the last 72 hours. Urinalysis    Component Value Date/Time   COLORURINE YELLOW (A) 11/15/2020 1605   APPEARANCEUR CLEAR (A) 11/15/2020 1605   APPEARANCEUR Clear 11/23/2014 2124   LABSPEC 1.016 11/15/2020 1605   LABSPEC 1.025 11/23/2014 2124   PHURINE 5.0 11/15/2020 1605   GLUCOSEU 50 (A) 11/15/2020 1605   GLUCOSEU >=500 11/23/2014 2124   HGBUR SMALL (A) 11/15/2020 1605   BILIRUBINUR NEGATIVE 11/15/2020 1605   BILIRUBINUR Negative 11/23/2014 2124   KETONESUR NEGATIVE 11/15/2020 1605   PROTEINUR NEGATIVE 11/15/2020 1605   NITRITE NEGATIVE 11/15/2020 1605   LEUKOCYTESUR NEGATIVE 11/15/2020 1605   LEUKOCYTESUR Negative 11/23/2014 2124   Sepsis Labs Invalid input(s): PROCALCITONIN,  WBC,  LACTICIDVEN Microbiology Recent Results (from the past 240 hour(s))  MRSA Next Gen by PCR, Nasal     Status: None   Collection Time: 09/30/21  7:25 PM   Specimen: Nasal Mucosa; Nasal Swab  Result Value Ref Range Status   MRSA by PCR Next Gen NOT DETECTED NOT DETECTED Final     Comment: (NOTE) The GeneXpert MRSA Assay (FDA approved for NASAL specimens only), is one component of a comprehensive MRSA colonization surveillance program. It is not intended to diagnose MRSA infection nor to guide or monitor treatment for MRSA infections. Test performance is not FDA approved in patients less than 17 years old. Performed at [facility], [place] [address], [place], [place] [address]      Time coordinating discharge:  35 minutes  SIGNED:   Barb Merino, MD  [facility] 10/10/2021, 11:38 AM

## 2021-10-10 NOTE — Progress Notes (Signed)
Report called to [facility]/ [phone] spoke with Myisha, LPN and provided telephone report for patient discharging to SNF. All questions answered.  ?

## 2021-10-10 NOTE — Progress Notes (Signed)
SLP Cancellation Note ? ?Patient Details ?Name: Danny Francesconi Roughton Sr. ?MRN: [id number] ?DOB: 10/12/1950 ? ? ?Cancelled treatment:       Reason Eval/Treat Not Completed: Patient at procedure or test/unavailable. NPO for procedure. Working with PT, will f/u tomorrow for swallow/speech tx.  ? ? ?Danny [name], [name] ?10/10/2021, 11:08 AM ?

## 2021-10-10 NOTE — Progress Notes (Addendum)
This RN spoke with Interventional Radiology Charge RN Patty to confirm patient was scheduled for angiogram procedure. Patty RN informed this nurse that the patient was not on the schedule for today and they had no order for procedure.  ?This RN notified Dr. Sloan Leiter of above information, awaiting new order or updates from Attending provider. ?Patient remains NPO at this time.  ? ?1020 AM: Dr. Sloan Leiter rounding on unit, discussed above information. Dr. Sloan Leiter stated to give patient a diet for today as IR does not have patient on their schedule for today. Dr. Sloan Leiter to reach out to Interventional Radiology to coordinate procedure.  ?

## 2021-10-10 NOTE — Progress Notes (Signed)
[facility] [id number] [facility]) Hospital Liaison note: ? ?Notified via Epic workque by Dr Dorcas Carrow, MD and Dorette Grate by Harvin Hazel, RN -[facility] of request for [facility] Palliative Care services. Will continue to follow for disposition. ? ?Please call with any outpatient palliative questions or concerns. ? ?Thank you for the opportunity to participate in this patient's care. ? ?Thank you, ?Abran Cantor, LPN ?[facility] Hospital Liaison ?[phone] ?

## 2021-10-10 NOTE — Progress Notes (Signed)
Physical Therapy Treatment ?Patient Details ?Name: Danny [name]. ?MRN: [id number] ?DOB: [birth year] ?Today's Date: 10/10/2021 ? ? ?History of Present Illness Danny [name]. is a 71 y.o. male who presented to [facility] after being found down by family. CT: positive for R IVH.  Transferred to [facility]. MRI: Punctate acute infarct in the right frontal cortex and unchanged hemorrhagic volume and ventriculomegaly. PHMx: hypertension, COPD, DM, CAD CHF, PAD s/p L BKA 11/2020, anddementia. ? ?  ?PT Comments  ? ? Pt remains to have double vision, impaired cognition, impaired balance, and requires maxAX2 for mobility. This session focused on sitting EOB balance. Despite max verbal and tactile cues pt continues to require maxA for support to maintain balance due to strong L lateral posterior lean. Worked on coming down on R elbow to raise awareness to R side. Acute PT to cont to follow. ?   ?Recommendations for follow up therapy are one component of a multi-disciplinary discharge planning process, led by the attending physician.  Recommendations may be updated based on patient status, additional functional criteria and insurance authorization. ? ?Follow Up Recommendations ? Skilled nursing-short term rehab (<3 hours/day) ?  ?  ?Assistance Recommended at Discharge Frequent or constant Supervision/Assistance  ?Patient can return home with the following Two people to help with walking and/or transfers;Two people to help with bathing/dressing/bathroom;Direct supervision/assist for medications management;Help with stairs or ramp for entrance;Assist for transportation;Assistance with feeding;Assistance with cooking/housework;Direct supervision/assist for financial management ?  ?Equipment Recommendations ? Other (comment) (TBD)  ?  ?Recommendations for Other Services   ? ? ?  ?Precautions / Restrictions Precautions ?Precautions: Fall ?Restrictions ?Weight Bearing Restrictions: No ?Other Position/Activity  Restrictions: L LE Prosthesis is at home  ?  ? ?Mobility ? Bed Mobility ?Overal bed mobility: Needs Assistance ?Bed Mobility: Supine to Sit, Sit to Supine ?Rolling: Max assist ?Sidelying to sit: Total assist ?Supine to sit: Max assist ?Sit to supine: Max assist, +2 for physical assistance ?Sit to sidelying: Max assist ?General bed mobility comments: patient required assistance with trunk and BLEs to get to EOB ?  ? ?Transfers ?  ?  ?  ?  ?  ?  ?  ?  ?  ?General transfer comment: couldn't attempt as pt unable to maintain EOB balance, strong L lateral list ?  ? ?Ambulation/Gait ?  ?  ?  ?  ?  ?  ?  ?  ? ? ?Stairs ?  ?  ?  ?  ?  ? ? ?Wheelchair Mobility ?  ? ?Modified Rankin (Stroke Patients Only) ?Modified Rankin (Stroke Patients Only) ?Pre-Morbid Rankin Score: Moderately severe disability ?Modified Rankin: Severe disability ? ? ?  ?Balance Overall balance assessment: Needs assistance ?Sitting-balance support: Bilateral upper extremity supported, Feet supported ?Sitting balance-[name]: Poor ?Sitting balance - Comments: patient was max assist due to posterior and left lateral leaning ?Postural control: Left lateral lean, Posterior lean ?  ?  ?  ?  ?  ?  ?  ?  ?  ?  ?  ?  ?  ?  ?  ? ?  ?Cognition Arousal/Alertness: Awake/alert ?Behavior During Therapy: Flat affect ?Overall Cognitive Status: History of cognitive impairments - at baseline ?  ?  ?  ?  ?  ?  ?  ?  ?  ?  ?  ?  ?  ?  ?  ?  ?General Comments: inconsistently following commands, was aware he was in the hospital in addition to his  son, unable to state month ?  ?  ? ?  ?Exercises   ? ?  ?General Comments General comments (skin integrity, edema, etc.): BP 153/99, dry flaky sking on L cheek ?  ?  ? ?Pertinent Vitals/Pain Pain Assessment ?Pain Assessment: Faces ?Faces Pain Scale: Hurts even more ?Pain Location: headache ?Pain Descriptors / Indicators: Headache ?Pain Intervention(s): Patient requesting pain meds-RN notified  ? ? ?Home Living   ?  ?  ?  ?  ?  ?  ?   ?  ?  ?   ?  ?Prior Function    ?  ?  ?   ? ?PT Goals (current goals can now be found in the care plan section) Acute Rehab PT Goals ?PT Goal Formulation: With patient ?Time For Goal Achievement: 10/17/21 ?Potential to Achieve Goals: Fair ?Progress towards PT goals: Progressing toward goals ? ?  ?Frequency ? ? ? Min 3X/week ? ? ? ?  ?PT Plan Current plan remains appropriate  ? ? ?Co-evaluation   ?  ?  ?  ?  ? ?  ?AM-PAC PT "6 Clicks" Mobility   ?Outcome Measure ? Help needed turning from your back to your side while in a flat bed without using bedrails?: Total ?Help needed moving from lying on your back to sitting on the side of a flat bed without using bedrails?: Total ?Help needed moving to and from a bed to a chair (including a wheelchair)?: Total ?Help needed standing up from a chair using your arms (e.g., wheelchair or bedside chair)?: Total ?Help needed to walk in hospital room?: Total ?Help needed climbing 3-5 steps with a railing? : Total ?6 Click Score: 6 ? ?  ?End of Session Equipment Utilized During Treatment: Gait belt ?Activity Tolerance: Patient limited by fatigue ?Patient left: in bed;with call bell/phone within reach;with bed alarm set ?Nurse Communication: Mobility status ?PT Visit Diagnosis: Other symptoms and signs involving the nervous system (R29.898);Muscle weakness (generalized) (M62.81);History of falling (Z91.81) ?  ? ? ?Time: 1610-9604 ?PT Time Calculation (min) (ACUTE ONLY): 22 min ? ?Charges:  $Neuromuscular Re-education: 8-22 mins          ?          ? ?Lewis Shock, PT, DPT ?Acute Rehabilitation Services ?Pager #: [phone] ?Office #: [phone] ? ? ? ?Shonda Mandarino M Areesha Dehaven ?10/10/2021, 10:38 AM ? ?

## 2021-10-10 NOTE — Progress Notes (Signed)
Patient to discharge to [facility] [employer] [employer] Patient updated about discharge plan. Patient asking about his cell phone, family member, daughter Arline Asp called to update her about pending discharge and she notified this writer that she has patient's cell phone. Updated patient about the status of his cell phone.  ?

## 2021-10-10 NOTE — TOC Transition Note (Signed)
Transition of Care (TOC) - CM/SW Discharge Note ? ? ?Patient Details  ?Name: Danny Sharp Butrick Sr. ?MRN: [id number] ?Date of Birth: 10/18/1950 ? ?Transition of Care (TOC) CM/SW Contact:  ?Fulda, LCSW ?Phone Number: ?10/10/2021, 1:48 PM ? ? ?Clinical Narrative:    ? ?Patient will DC to: [facility] ?Anticipated DC date: 10/10/2021 ?Family notified: Daughter [name] ?Transport by: Corey Harold ? ? ?Per MD patient ready for DC to [facility]. RN, patient, patient's family, and facility notified of DC. Discharge Summary and FL2 sent to facility. RN to call report prior to discharge ([phone] Room 101b). DC packet on chart. Ambulance transport requested for patient.  ? ?CSW will sign off for now as social work intervention is no longer needed. Please consult [place] again if new needs arise.  ? ? ?Final next level of care: [place] ?Barriers to Discharge: No Barriers Identified ? ? ?Patient Goals and CMS Choice ?Patient states their goals for this hospitalization and ongoing recovery are:: Rehab ?CMS Medicare.gov Compare Post Acute Care list provided to:: Patient Represenative (must comment) ?Choice offered to / list presented to : Adult Children ? ?Discharge Placement ?  ?           ?Patient chooses bed at: [facility] ?Patient to be transferred to facility by: [facility] ?Name of family member notified: Daughter [name] ?Patient and family notified of of transfer: 10/10/21 ? ?Discharge Plan and Services ?In-house Referral: Clinical Social Work ?  ?Post Acute Care Choice: [place]          ?  ?  ?  ?  ?  ?  ?  ?  ?  ?  ? ?Social Determinants of Health (SDOH) Interventions ?  ? ? ?Readmission Risk Interventions ?Readmission Risk Prevention Plan 07/30/2019  ?Transportation Screening Complete  ?PCP or Specialist Appt within 5-7 Days Complete  ?Medication Review (RN CM) Complete  ?Some recent data might be hidden  ? ? ? ? ? ?

## 2021-10-11 ENCOUNTER — Ambulatory Visit: Payer: Self-pay | Admitting: Pharmacist

## 2021-10-11 DIAGNOSIS — I615 Nontraumatic intracerebral hemorrhage, intraventricular: Secondary | ICD-10-CM | POA: Diagnosis not present

## 2021-10-11 DIAGNOSIS — E785 Hyperlipidemia, unspecified: Secondary | ICD-10-CM | POA: Diagnosis not present

## 2021-10-11 DIAGNOSIS — I10 Essential (primary) hypertension: Secondary | ICD-10-CM | POA: Diagnosis not present

## 2021-10-11 DIAGNOSIS — L89322 Pressure ulcer of left buttock, stage 2: Secondary | ICD-10-CM | POA: Diagnosis not present

## 2021-10-11 NOTE — Progress Notes (Signed)
Patient discharged to SNF, picked up by [facility], patient's belonging sent with patient. ?

## 2021-10-11 NOTE — Chronic Care Management (AMB) (Signed)
?  Chronic Care Management  ? ?Note ? ?10/11/2021 ?Name: Danny Lacko Stofko Sr. MRN: [id number] DOB: 1951-04-10 ? ? ? ?Closing pharmacy CCM case at this time.  Patient has clinic contact information for future questions or concerns.  ? ?Catie Feliz Beam, PharmD, [place], CPP ?Clinical Pharmacist ?Nature conservation officer at [employer] ?[phone] ? ?

## 2021-10-15 ENCOUNTER — Other Ambulatory Visit: Payer: Self-pay | Admitting: Family Medicine

## 2021-10-16 DIAGNOSIS — L89322 Pressure ulcer of left buttock, stage 2: Secondary | ICD-10-CM | POA: Diagnosis not present

## 2021-10-16 DIAGNOSIS — Z89512 Acquired absence of left leg below knee: Secondary | ICD-10-CM | POA: Diagnosis not present

## 2021-10-16 DIAGNOSIS — J449 Chronic obstructive pulmonary disease, unspecified: Secondary | ICD-10-CM | POA: Diagnosis not present

## 2021-10-16 DIAGNOSIS — J439 Emphysema, unspecified: Secondary | ICD-10-CM | POA: Diagnosis not present

## 2021-10-16 DIAGNOSIS — N183 Chronic kidney disease, stage 3 unspecified: Secondary | ICD-10-CM | POA: Diagnosis not present

## 2021-10-16 DIAGNOSIS — I2510 Atherosclerotic heart disease of native coronary artery without angina pectoris: Secondary | ICD-10-CM | POA: Diagnosis not present

## 2021-10-16 DIAGNOSIS — E785 Hyperlipidemia, unspecified: Secondary | ICD-10-CM | POA: Diagnosis not present

## 2021-10-16 DIAGNOSIS — I5042 Chronic combined systolic (congestive) and diastolic (congestive) heart failure: Secondary | ICD-10-CM | POA: Diagnosis not present

## 2021-10-16 DIAGNOSIS — F32A Depression, unspecified: Secondary | ICD-10-CM | POA: Diagnosis not present

## 2021-10-16 DIAGNOSIS — I615 Nontraumatic intracerebral hemorrhage, intraventricular: Secondary | ICD-10-CM | POA: Diagnosis not present

## 2021-10-16 DIAGNOSIS — I10 Essential (primary) hypertension: Secondary | ICD-10-CM | POA: Diagnosis not present

## 2021-10-17 ENCOUNTER — Telehealth: Payer: Self-pay

## 2021-10-17 NOTE — Telephone Encounter (Signed)
This entire document was not scanned in to this note. I discussed this with the CMA and she noted she'd already deleted it.  ?

## 2021-10-17 NOTE — Telephone Encounter (Signed)
For your review.  Danny [name],cma  ? ? ?

## 2021-10-19 DIAGNOSIS — Z89512 Acquired absence of left leg below knee: Secondary | ICD-10-CM | POA: Diagnosis not present

## 2021-10-19 DIAGNOSIS — J439 Emphysema, unspecified: Secondary | ICD-10-CM | POA: Diagnosis not present

## 2021-10-19 DIAGNOSIS — N183 Chronic kidney disease, stage 3 unspecified: Secondary | ICD-10-CM | POA: Diagnosis not present

## 2021-10-19 DIAGNOSIS — I2510 Atherosclerotic heart disease of native coronary artery without angina pectoris: Secondary | ICD-10-CM | POA: Diagnosis not present

## 2021-10-19 DIAGNOSIS — F32A Depression, unspecified: Secondary | ICD-10-CM | POA: Diagnosis not present

## 2021-10-19 DIAGNOSIS — I5042 Chronic combined systolic (congestive) and diastolic (congestive) heart failure: Secondary | ICD-10-CM | POA: Diagnosis not present

## 2021-10-19 DIAGNOSIS — J449 Chronic obstructive pulmonary disease, unspecified: Secondary | ICD-10-CM | POA: Diagnosis not present

## 2021-10-19 DIAGNOSIS — I615 Nontraumatic intracerebral hemorrhage, intraventricular: Secondary | ICD-10-CM | POA: Diagnosis not present

## 2021-10-19 DIAGNOSIS — E785 Hyperlipidemia, unspecified: Secondary | ICD-10-CM | POA: Diagnosis not present

## 2021-10-19 DIAGNOSIS — I10 Essential (primary) hypertension: Secondary | ICD-10-CM | POA: Diagnosis not present

## 2021-10-24 ENCOUNTER — Other Ambulatory Visit: Payer: Self-pay

## 2021-10-24 ENCOUNTER — Non-Acute Institutional Stay: Payer: Medicaid Other | Admitting: Hospice

## 2021-10-24 DIAGNOSIS — E785 Hyperlipidemia, unspecified: Secondary | ICD-10-CM | POA: Diagnosis not present

## 2021-10-24 DIAGNOSIS — J449 Chronic obstructive pulmonary disease, unspecified: Secondary | ICD-10-CM | POA: Diagnosis not present

## 2021-10-24 DIAGNOSIS — N183 Chronic kidney disease, stage 3 unspecified: Secondary | ICD-10-CM | POA: Diagnosis not present

## 2021-10-24 DIAGNOSIS — F32A Depression, unspecified: Secondary | ICD-10-CM | POA: Diagnosis not present

## 2021-10-24 DIAGNOSIS — I2510 Atherosclerotic heart disease of native coronary artery without angina pectoris: Secondary | ICD-10-CM | POA: Diagnosis not present

## 2021-10-24 DIAGNOSIS — Z515 Encounter for palliative care: Secondary | ICD-10-CM

## 2021-10-24 DIAGNOSIS — Z89512 Acquired absence of left leg below knee: Secondary | ICD-10-CM | POA: Diagnosis not present

## 2021-10-24 DIAGNOSIS — I615 Nontraumatic intracerebral hemorrhage, intraventricular: Secondary | ICD-10-CM

## 2021-10-24 DIAGNOSIS — I5042 Chronic combined systolic (congestive) and diastolic (congestive) heart failure: Secondary | ICD-10-CM | POA: Diagnosis not present

## 2021-10-24 DIAGNOSIS — J439 Emphysema, unspecified: Secondary | ICD-10-CM | POA: Diagnosis not present

## 2021-10-24 DIAGNOSIS — I5032 Chronic diastolic (congestive) heart failure: Secondary | ICD-10-CM

## 2021-10-24 DIAGNOSIS — I10 Essential (primary) hypertension: Secondary | ICD-10-CM | POA: Diagnosis not present

## 2021-10-24 NOTE — Progress Notes (Signed)
? ? ?Manufacturing engineer ?Community Palliative Care Consult Note ?Telephone: [phone]  ?Fax: [phone] ? ?PATIENT NAME: Danny Lawless Fehnel Sr. ?[address] ?[place] [address] ?[phone] (home)  ?DOB: 05-29-1951 ?MRN: [id number] ? ?PRIMARY CARE PROVIDER:    ?[name], MD,  ?[address] ?[place] [address] ?[phone] ? ?REFERRING PROVIDER:   ?[name], MD ?[address] ?[address] ?[place],  [place] [address] ?[phone] ? ?RESPONSIBLE PARTY:   Self ?Contact Information   ? ? Name Relation Home Work Mobile  ? Dunn,Cindy Daughter  [phone]   ? Filsinger jr,Lonzie Son   [phone]  ? ?  ? ? ? ?I met face to face with patient at facility. Visit to build trust and highlight Palliative Medicine as specialized medical care for people living with serious illness, aimed at facilitating better quality of life through symptoms relief, assisting with advance care planning and complex medical decision making. NP called [name] and updated her on visit. Patient/Cindy endorsed palliative service ?ASSESSMENT AND / RECOMMENDATIONS:  ? ?Advance Care Planning: Our advance care planning conversation included a discussion about:    ?The value and importance of advance care planning  ?Difference between Hospice and Palliative care ?Exploration of goals of care in the event of a sudden injury or illness  ?Identification and preparation of a healthcare agent  ?Review and updating or creation of an  advance directive document . ?Decision not to resuscitate or to de-escalate disease focused treatments due to poor prognosis. ? ?CODE STATUS:Patient affirmed he is a Full code ? ?Goals of Care: Goals include to maximize quality of life and symptom management ? ?I spent  16 minutes providing this initial consultation. More than 50% of the time in this consultation was spent on counseling patient and coordinating  communication. ?-------------------------------------------------------------------------------------------------------------------------------------- ? ?Symptom Management/Plan: ?Intracerebral hemorrage: secondary to hypertensive angiopathy. Aspirin and Plaxi were discontinued. Follow up with neurologist and cardiologist as planned. Continue Carvidilol, Entresto, Isosorbide, Amlodipine and  Spironolactone.  ? ?COPD: Encouraged slow deep breathing, avoid triggers.  Continue albuterol, Advair.  ?Generalized weakness: PT/OT/ST is ongoing.  ? ?TML:YYTKPTWS Furosemide.  Adhere to salt and fluid limits.  Monitor weight and report weight increase of 2 pounds in a day or 5 pounds in a week. ? ?Type 2 DM: Well controlled. Current A1c is 7.4. Repeat in 3 months.  Continue Metformin, Trulicity, Jardiance and Insulin as currently ordered. Blood sugar monitoring ACHS.  ?Follow up: Palliative care will continue to follow for complex medical decision making, advance care planning, and clarification of goals. Return 6 weeks or prn. Encouraged to call provider sooner with any concerns.  ? ?Family /Caregiver/Community Supports: Patient in SNF for ongoing care ? ?HOSPICE ELIGIBILITY/DIAGNOSIS: TBD ? ?Chief Complaint: Initial Palliative care visit ? ?HISTORY OF PRESENT ILLNESS:  Danny GHRIST Sr. is a 71 y.o. year old male  with multiple morbidities requiring close monitoring and with high risk of complications and  mortality: Acute hemorrhagic stroke with intraventricular hemorrhage of the right lateral ventricle, secondary to hypertensive angiopathy, CHF, Type 2 DM and COPD. History obtained from review of EMR, discussion with primary team, caregiver, family and/or Danny [name].  ?Review and summarization of Epic records shows history from other than patient. Rest of 10 point ROS asked and negative. Independent interpretation of tests and reviewed as needed, available labs, patient records, imaging, studies and related documents from  the EMR. ? ?Physical Exam: ?Constitutional: NAD ?General: Well groomed, cooperative ?EYES: anicteric sclera, lids intact, no  discharge  ?ENMT: Moist mucous membrane ?CV: S1 S2, RRR, no LE edema ?Pulmonary: LCTA, no increased work of breathing, no cough, ?Abdomen: active BS + 4 quadrants, soft and non tender ?GU: no suprapubic tenderness ?MSK: weakness, limited ROM, LBKA ?Skin: warm and dry, no rashes or wounds on visible skin ?Neuro:  weakness, otherwise non focal ?Psych: non-anxious affect ?Hem/lymph/immuno: no widespread bruising ? ? ?PAST MEDICAL HISTORY:  ?Active Ambulatory Problems  ?  Diagnosis Date Noted  ? Type 2 diabetes mellitus with hyperlipidemia ([place]) 06/05/2010  ? Hyperlipidemia 02/10/2010  ? Essential hypertension 02/10/2010  ? CAD (coronary artery disease) 02/10/2010  ? GERD (gastroesophageal reflux disease) 12/20/2014  ? COPD with chronic bronchitis ([place]) 06/20/2015  ? Morbid obesity ([place]) 06/18/2016  ? H/O medication noncompliance 06/18/2016  ? Chronic diastolic CHF (congestive heart failure) ([place]) 03/05/2017  ? CKD (chronic kidney disease) stage 3, GFR 30-59 ml/min (HCC) 03/06/2017  ? Diabetic neuropathy ([place]) 03/06/2017  ? Orthostasis 05/07/2017  ? Anxiety and depression 05/07/2017  ? Decreased pedal pulses 07/02/2017  ? COPD with acute exacerbation ([place]) 11/26/2017  ? Atherosclerosis of native arteries of extremity with intermittent claudication ([place]) 02/11/2018  ? Chest pain 10/31/2018  ? Coronary artery disease of native artery of native heart with stable angina pectoris ([place]) 12/12/2018  ? Pure hypercholesterolemia 12/12/2018  ? AKI (acute kidney injury) ([place]) 12/28/2019  ? Onychomycosis 03/29/2020  ? Fall 03/29/2020  ? Low back pain 06/29/2020  ? Atherosclerosis of native arteries of the extremities with gangrene ([place]) 10/28/2020  ? Sepsis ([place]) 11/06/2020  ? Gangrene of left foot ([place])   ? PVD (peripheral vascular disease) ([place])   ? Stage 2 chronic kidney disease   ? Thrush   ? Cellulitis  of left foot 11/09/2020  ? HFrEF (heart failure with reduced ejection fraction) ([place])   ? Chronic systolic heart failure ([place])   ? Reactive thrombocytosis 11/14/2020  ? Hx of BKA, left ([place]) 12/16/2020  ? Irregular heart beat 01/24/2021  ? SIRS (systemic inflammatory response syndrome) ([place]) 08/15/2021  ? ICH (intracerebral hemorrhage) ([place]) 09/30/2021  ? ?Resolved Ambulatory Problems  ?  Diagnosis Date Noted  ? Syncope 12/13/2014  ? NSTEMI (non-ST elevated myocardial infarction) ([place])   ? CKD (chronic kidney disease) stage 4, GFR 15-29 ml/min (HCC) 03/05/2017  ? CHF (congestive heart failure) ([place]) 03/05/2017  ? Respiratory illness 10/31/2018  ? Centrilobular emphysema ([place]) 12/12/2018  ? Acute hypoxemic respiratory failure due to COVID-19 [facility]) 07/29/2019  ? ?Past Medical History:  ?Diagnosis Date  ? Chronic combined systolic and diastolic CHF (congestive heart failure) ([place])   ? CKD (chronic kidney disease), stage III ([place])   ? COPD (chronic obstructive pulmonary disease) ([place])   ? DM2 (diabetes mellitus, type 2) ([place])   ? Erectile dysfunction   ? HLD (hyperlipidemia)   ? HTN (hypertension)   ? Ischemic cardiomyopathy   ? ? ?SOCIAL HX:  ?Social History  ? ?Tobacco Use  ? Smoking status: Former  ?  Packs/day: 3.00  ?  Years: 0.00  ?  Pack years: 0.00  ?  Types: Cigarettes  ?  Quit date: 09/07/2009  ?  Years since quitting: 12.1  ? Smokeless tobacco: Never  ? Tobacco comments:  ?  quit june 2011  ?Substance Use Topics  ? Alcohol use: Yes  ?  Comment: every other weekend  ? ?  ?FAMILY HX:  ?Family History  ?Problem Relation Age of Onset  ? Heart attack Father   ?  complications  ? Cancer Brother   ?   ? ?ALLERGIES: No Known Allergies   ? ?PERTINENT MEDICATIONS:  ?Outpatient Encounter Medications as of 10/24/2021  ?Medication Sig  ? acetaminophen (TYLENOL) 325 MG tablet Take 2 tablets (650 mg total) by mouth every 4 (four) hours as needed for mild pain (or temp > 37.5 C (99.5 F)).  ? albuterol (PROVENTIL) (2.5  MG/3ML) 0.083% nebulizer solution Take 3 mLs (2.5 mg total) by nebulization every 6 (six) hours as needed for wheezing or shortness of breath.  ? Albuterol Sulfate (PROAIR RESPICLICK) 121 (90 Base) MCG/ACT AEPB Inhale 1 puff into the lungs every 6 (six) hours as needed.  ? amLODip

## 2021-10-27 ENCOUNTER — Telehealth: Payer: Self-pay

## 2021-10-27 NOTE — Telephone Encounter (Signed)
I called the patient and his VM is not setup, I called to see if we can schedule a visit with the provider for paperwork to get him a [employer]. I have the form.  Rashawna Scoles,cma  ?

## 2021-10-30 ENCOUNTER — Telehealth (INDEPENDENT_AMBULATORY_CARE_PROVIDER_SITE_OTHER): Payer: Self-pay | Admitting: Nurse Practitioner

## 2021-10-30 DIAGNOSIS — J439 Emphysema, unspecified: Secondary | ICD-10-CM | POA: Diagnosis not present

## 2021-10-30 DIAGNOSIS — I10 Essential (primary) hypertension: Secondary | ICD-10-CM | POA: Diagnosis not present

## 2021-10-30 DIAGNOSIS — I5042 Chronic combined systolic (congestive) and diastolic (congestive) heart failure: Secondary | ICD-10-CM | POA: Diagnosis not present

## 2021-10-30 DIAGNOSIS — J449 Chronic obstructive pulmonary disease, unspecified: Secondary | ICD-10-CM | POA: Diagnosis not present

## 2021-10-30 DIAGNOSIS — I615 Nontraumatic intracerebral hemorrhage, intraventricular: Secondary | ICD-10-CM | POA: Diagnosis not present

## 2021-10-30 DIAGNOSIS — I2510 Atherosclerotic heart disease of native coronary artery without angina pectoris: Secondary | ICD-10-CM | POA: Diagnosis not present

## 2021-10-30 DIAGNOSIS — E785 Hyperlipidemia, unspecified: Secondary | ICD-10-CM | POA: Diagnosis not present

## 2021-10-30 DIAGNOSIS — N183 Chronic kidney disease, stage 3 unspecified: Secondary | ICD-10-CM | POA: Diagnosis not present

## 2021-10-30 DIAGNOSIS — F32A Depression, unspecified: Secondary | ICD-10-CM | POA: Diagnosis not present

## 2021-10-30 DIAGNOSIS — Z89512 Acquired absence of left leg below knee: Secondary | ICD-10-CM | POA: Diagnosis not present

## 2021-10-30 NOTE — Telephone Encounter (Signed)
Danny [name] with the assisted living states that she called on this pt last for pain and discomfort in the lower R extremity.  Now his R lower extremity is cool to the touch and his pain is worse.  Danny [name] states that he has a history of PVD and also the pain management dr came to see him and states he needs to see vein and vascular. Please advise ?

## 2021-10-30 NOTE — Telephone Encounter (Signed)
Charlene from Assisted Living Facility called and stated Danny [name] is having lower extrimities discoloration, PVD and pain.  She would like to make him an appointment to be seen.  Please advise.  She can be reached at [phone]. ?

## 2021-10-30 NOTE — Telephone Encounter (Signed)
Bring him in with [facility]Is

## 2021-10-31 ENCOUNTER — Ambulatory Visit (INDEPENDENT_AMBULATORY_CARE_PROVIDER_SITE_OTHER): Payer: Medicare Other | Admitting: Nurse Practitioner

## 2021-10-31 ENCOUNTER — Other Ambulatory Visit (INDEPENDENT_AMBULATORY_CARE_PROVIDER_SITE_OTHER): Payer: Self-pay | Admitting: Vascular Surgery

## 2021-10-31 ENCOUNTER — Other Ambulatory Visit: Payer: Self-pay

## 2021-10-31 ENCOUNTER — Other Ambulatory Visit (INDEPENDENT_AMBULATORY_CARE_PROVIDER_SITE_OTHER): Payer: Self-pay | Admitting: Nurse Practitioner

## 2021-10-31 ENCOUNTER — Ambulatory Visit (INDEPENDENT_AMBULATORY_CARE_PROVIDER_SITE_OTHER): Payer: Medicare Other

## 2021-10-31 ENCOUNTER — Encounter (INDEPENDENT_AMBULATORY_CARE_PROVIDER_SITE_OTHER): Payer: Self-pay | Admitting: Nurse Practitioner

## 2021-10-31 VITALS — BP 111/72 | HR 96

## 2021-10-31 DIAGNOSIS — I70262 Atherosclerosis of native arteries of extremities with gangrene, left leg: Secondary | ICD-10-CM

## 2021-10-31 DIAGNOSIS — E785 Hyperlipidemia, unspecified: Secondary | ICD-10-CM | POA: Diagnosis not present

## 2021-10-31 DIAGNOSIS — E1169 Type 2 diabetes mellitus with other specified complication: Secondary | ICD-10-CM | POA: Diagnosis not present

## 2021-10-31 DIAGNOSIS — I70261 Atherosclerosis of native arteries of extremities with gangrene, right leg: Secondary | ICD-10-CM | POA: Diagnosis not present

## 2021-10-31 DIAGNOSIS — E7800 Pure hypercholesterolemia, unspecified: Secondary | ICD-10-CM

## 2021-11-02 DIAGNOSIS — I615 Nontraumatic intracerebral hemorrhage, intraventricular: Secondary | ICD-10-CM | POA: Diagnosis not present

## 2021-11-02 DIAGNOSIS — I10 Essential (primary) hypertension: Secondary | ICD-10-CM | POA: Diagnosis not present

## 2021-11-02 DIAGNOSIS — E785 Hyperlipidemia, unspecified: Secondary | ICD-10-CM | POA: Diagnosis not present

## 2021-11-02 DIAGNOSIS — L89322 Pressure ulcer of left buttock, stage 2: Secondary | ICD-10-CM | POA: Diagnosis not present

## 2021-11-03 DIAGNOSIS — M6281 Muscle weakness (generalized): Secondary | ICD-10-CM | POA: Diagnosis not present

## 2021-11-03 DIAGNOSIS — I615 Nontraumatic intracerebral hemorrhage, intraventricular: Secondary | ICD-10-CM | POA: Diagnosis not present

## 2021-11-03 DIAGNOSIS — Z1159 Encounter for screening for other viral diseases: Secondary | ICD-10-CM | POA: Diagnosis not present

## 2021-11-04 DIAGNOSIS — K219 Gastro-esophageal reflux disease without esophagitis: Secondary | ICD-10-CM | POA: Diagnosis not present

## 2021-11-04 DIAGNOSIS — I2510 Atherosclerotic heart disease of native coronary artery without angina pectoris: Secondary | ICD-10-CM | POA: Diagnosis not present

## 2021-11-04 DIAGNOSIS — R262 Difficulty in walking, not elsewhere classified: Secondary | ICD-10-CM | POA: Diagnosis not present

## 2021-11-04 DIAGNOSIS — N179 Acute kidney failure, unspecified: Secondary | ICD-10-CM | POA: Diagnosis not present

## 2021-11-04 DIAGNOSIS — F32A Depression, unspecified: Secondary | ICD-10-CM | POA: Diagnosis not present

## 2021-11-04 DIAGNOSIS — Z1159 Encounter for screening for other viral diseases: Secondary | ICD-10-CM | POA: Diagnosis not present

## 2021-11-04 DIAGNOSIS — J439 Emphysema, unspecified: Secondary | ICD-10-CM | POA: Diagnosis not present

## 2021-11-04 DIAGNOSIS — J449 Chronic obstructive pulmonary disease, unspecified: Secondary | ICD-10-CM | POA: Diagnosis not present

## 2021-11-04 DIAGNOSIS — E785 Hyperlipidemia, unspecified: Secondary | ICD-10-CM | POA: Diagnosis not present

## 2021-11-04 DIAGNOSIS — I6501 Occlusion and stenosis of right vertebral artery: Secondary | ICD-10-CM | POA: Diagnosis not present

## 2021-11-04 DIAGNOSIS — Z89512 Acquired absence of left leg below knee: Secondary | ICD-10-CM | POA: Diagnosis not present

## 2021-11-04 DIAGNOSIS — I739 Peripheral vascular disease, unspecified: Secondary | ICD-10-CM | POA: Diagnosis not present

## 2021-11-04 DIAGNOSIS — E46 Unspecified protein-calorie malnutrition: Secondary | ICD-10-CM | POA: Diagnosis not present

## 2021-11-04 DIAGNOSIS — E1165 Type 2 diabetes mellitus with hyperglycemia: Secondary | ICD-10-CM | POA: Diagnosis not present

## 2021-11-04 DIAGNOSIS — I5042 Chronic combined systolic (congestive) and diastolic (congestive) heart failure: Secondary | ICD-10-CM | POA: Diagnosis not present

## 2021-11-04 DIAGNOSIS — R2681 Unsteadiness on feet: Secondary | ICD-10-CM | POA: Diagnosis not present

## 2021-11-04 DIAGNOSIS — I69991 Dysphagia following unspecified cerebrovascular disease: Secondary | ICD-10-CM | POA: Diagnosis not present

## 2021-11-04 DIAGNOSIS — R531 Weakness: Secondary | ICD-10-CM | POA: Diagnosis not present

## 2021-11-04 DIAGNOSIS — M545 Low back pain, unspecified: Secondary | ICD-10-CM | POA: Diagnosis not present

## 2021-11-04 DIAGNOSIS — G910 Communicating hydrocephalus: Secondary | ICD-10-CM | POA: Diagnosis not present

## 2021-11-04 DIAGNOSIS — E1140 Type 2 diabetes mellitus with diabetic neuropathy, unspecified: Secondary | ICD-10-CM | POA: Diagnosis not present

## 2021-11-04 DIAGNOSIS — I615 Nontraumatic intracerebral hemorrhage, intraventricular: Secondary | ICD-10-CM | POA: Diagnosis not present

## 2021-11-04 DIAGNOSIS — M25562 Pain in left knee: Secondary | ICD-10-CM | POA: Diagnosis not present

## 2021-11-04 DIAGNOSIS — I5022 Chronic systolic (congestive) heart failure: Secondary | ICD-10-CM | POA: Diagnosis not present

## 2021-11-04 DIAGNOSIS — I69820 Aphasia following other cerebrovascular disease: Secondary | ICD-10-CM | POA: Diagnosis not present

## 2021-11-04 DIAGNOSIS — M6259 Muscle wasting and atrophy, not elsewhere classified, multiple sites: Secondary | ICD-10-CM | POA: Diagnosis not present

## 2021-11-04 DIAGNOSIS — M6281 Muscle weakness (generalized): Secondary | ICD-10-CM | POA: Diagnosis not present

## 2021-11-04 DIAGNOSIS — N183 Chronic kidney disease, stage 3 unspecified: Secondary | ICD-10-CM | POA: Diagnosis not present

## 2021-11-04 DIAGNOSIS — I10 Essential (primary) hypertension: Secondary | ICD-10-CM | POA: Diagnosis not present

## 2021-11-05 DIAGNOSIS — Z1159 Encounter for screening for other viral diseases: Secondary | ICD-10-CM | POA: Diagnosis not present

## 2021-11-05 DIAGNOSIS — M6281 Muscle weakness (generalized): Secondary | ICD-10-CM | POA: Diagnosis not present

## 2021-11-05 DIAGNOSIS — I615 Nontraumatic intracerebral hemorrhage, intraventricular: Secondary | ICD-10-CM | POA: Diagnosis not present

## 2021-11-06 DIAGNOSIS — J439 Emphysema, unspecified: Secondary | ICD-10-CM | POA: Diagnosis not present

## 2021-11-06 DIAGNOSIS — I2510 Atherosclerotic heart disease of native coronary artery without angina pectoris: Secondary | ICD-10-CM | POA: Diagnosis not present

## 2021-11-06 DIAGNOSIS — J449 Chronic obstructive pulmonary disease, unspecified: Secondary | ICD-10-CM | POA: Diagnosis not present

## 2021-11-06 DIAGNOSIS — M6281 Muscle weakness (generalized): Secondary | ICD-10-CM | POA: Diagnosis not present

## 2021-11-06 DIAGNOSIS — I615 Nontraumatic intracerebral hemorrhage, intraventricular: Secondary | ICD-10-CM | POA: Diagnosis not present

## 2021-11-06 DIAGNOSIS — I5042 Chronic combined systolic (congestive) and diastolic (congestive) heart failure: Secondary | ICD-10-CM | POA: Diagnosis not present

## 2021-11-06 DIAGNOSIS — E785 Hyperlipidemia, unspecified: Secondary | ICD-10-CM | POA: Diagnosis not present

## 2021-11-06 DIAGNOSIS — Z89512 Acquired absence of left leg below knee: Secondary | ICD-10-CM | POA: Diagnosis not present

## 2021-11-06 DIAGNOSIS — N183 Chronic kidney disease, stage 3 unspecified: Secondary | ICD-10-CM | POA: Diagnosis not present

## 2021-11-06 DIAGNOSIS — Z1159 Encounter for screening for other viral diseases: Secondary | ICD-10-CM | POA: Diagnosis not present

## 2021-11-06 DIAGNOSIS — F32A Depression, unspecified: Secondary | ICD-10-CM | POA: Diagnosis not present

## 2021-11-06 DIAGNOSIS — I10 Essential (primary) hypertension: Secondary | ICD-10-CM | POA: Diagnosis not present

## 2021-11-06 DIAGNOSIS — E1140 Type 2 diabetes mellitus with diabetic neuropathy, unspecified: Secondary | ICD-10-CM | POA: Diagnosis not present

## 2021-11-07 DIAGNOSIS — Z1159 Encounter for screening for other viral diseases: Secondary | ICD-10-CM | POA: Diagnosis not present

## 2021-11-07 DIAGNOSIS — M6281 Muscle weakness (generalized): Secondary | ICD-10-CM | POA: Diagnosis not present

## 2021-11-07 DIAGNOSIS — I615 Nontraumatic intracerebral hemorrhage, intraventricular: Secondary | ICD-10-CM | POA: Diagnosis not present

## 2021-11-08 ENCOUNTER — Telehealth (INDEPENDENT_AMBULATORY_CARE_PROVIDER_SITE_OTHER): Payer: Self-pay

## 2021-11-08 DIAGNOSIS — I615 Nontraumatic intracerebral hemorrhage, intraventricular: Secondary | ICD-10-CM | POA: Diagnosis not present

## 2021-11-08 DIAGNOSIS — M6281 Muscle weakness (generalized): Secondary | ICD-10-CM | POA: Diagnosis not present

## 2021-11-08 DIAGNOSIS — Z1159 Encounter for screening for other viral diseases: Secondary | ICD-10-CM | POA: Diagnosis not present

## 2021-11-08 NOTE — Telephone Encounter (Signed)
A staff member from [facility] called back and the patient is scheduled with Dr. Lucky Cowboy on 11/13/21 with a 12:15 pm arrival time to the MM. Pre-procedure instructions will be faxed to the facility. ?

## 2021-11-08 NOTE — Telephone Encounter (Signed)
I attempted to contact the patient to schedule a RLE angio with Dr. Wyn Quaker. I am unable to leave a message on the home line or mobile line. I contacted the daughter and she wanted me to call the patient back in 5 mins. I contacted the patient again and patient stated that the rehab center has to schedule this appt. I asked what was the name of the facility and patient stated he didn't know and he had to call a staff member over to tell me.  The staff member stated he was at  [facility] and she advised I would have to call and speak with someone there to schedule his appt. I requested the phone number to call as we were speaking on the patient's phone and was given [phone]. I call and was unable to get through to speak with anyone. ?

## 2021-11-09 DIAGNOSIS — M6281 Muscle weakness (generalized): Secondary | ICD-10-CM | POA: Diagnosis not present

## 2021-11-09 DIAGNOSIS — I615 Nontraumatic intracerebral hemorrhage, intraventricular: Secondary | ICD-10-CM | POA: Diagnosis not present

## 2021-11-09 DIAGNOSIS — Z1159 Encounter for screening for other viral diseases: Secondary | ICD-10-CM | POA: Diagnosis not present

## 2021-11-10 DIAGNOSIS — I615 Nontraumatic intracerebral hemorrhage, intraventricular: Secondary | ICD-10-CM | POA: Diagnosis not present

## 2021-11-10 DIAGNOSIS — Z1159 Encounter for screening for other viral diseases: Secondary | ICD-10-CM | POA: Diagnosis not present

## 2021-11-10 DIAGNOSIS — M6281 Muscle weakness (generalized): Secondary | ICD-10-CM | POA: Diagnosis not present

## 2021-11-11 DIAGNOSIS — M6281 Muscle weakness (generalized): Secondary | ICD-10-CM | POA: Diagnosis not present

## 2021-11-11 DIAGNOSIS — Z1159 Encounter for screening for other viral diseases: Secondary | ICD-10-CM | POA: Diagnosis not present

## 2021-11-11 DIAGNOSIS — I615 Nontraumatic intracerebral hemorrhage, intraventricular: Secondary | ICD-10-CM | POA: Diagnosis not present

## 2021-11-12 ENCOUNTER — Encounter (INDEPENDENT_AMBULATORY_CARE_PROVIDER_SITE_OTHER): Payer: Self-pay | Admitting: Nurse Practitioner

## 2021-11-12 DIAGNOSIS — Z1159 Encounter for screening for other viral diseases: Secondary | ICD-10-CM | POA: Diagnosis not present

## 2021-11-12 DIAGNOSIS — I615 Nontraumatic intracerebral hemorrhage, intraventricular: Secondary | ICD-10-CM | POA: Diagnosis not present

## 2021-11-12 DIAGNOSIS — M6281 Muscle weakness (generalized): Secondary | ICD-10-CM | POA: Diagnosis not present

## 2021-11-12 NOTE — H&P (View-Only) (Signed)
? ?Subjective:  ? ? Patient ID: Danny M Spearman Sr., male    DOB: 02/01/1951, 71 y.o.   MRN: [id number] ?Chief Complaint  ?Patient presents with  ? Follow-up  ?  ultrasound  ? ? ?The patient returns to the office for followup and review of the noninvasive studies. There has been a significant deterioration in the lower extremity symptoms.  The patient notes interval shortening of their claudication distance and development of mild rest pain symptoms. New ulcers or wounds have occurred since the last visit. ? ?There have been no significant changes to the patient's overall health care. ? ?The patient denies amaurosis fugax or recent TIA symptoms. There are no recent neurological changes noted. ?The patient denies history of DVT, PE or superficial thrombophlebitis. ?The patient denies recent episodes of angina or shortness of breath.  ? ?ABI's Rt=1.58 and Lt=n/a (previous ABI's Rt=1.47 and Lt=n/a) ?Duplex US of the lower extremity arterial system shows right distal SFA/proximal popliteal arteries appear near occlusive.  Tibial artery waveforms are dampened monophasic with a nearly flat toe waveform. ? ? ?Review of Systems  ?Musculoskeletal:  Positive for gait problem.  ?Skin:  Positive for wound.  ?All other systems reviewed and are negative. ? ?   ?Objective:  ? Physical Exam ?Vitals reviewed.  ?HENT:  ?   Head: Normocephalic.  ?Cardiovascular:  ?   Rate and Rhythm: Normal rate.  ?   Pulses:     ?     Dorsalis pedis pulses are detected w/ Doppler on the right side.  ?Pulmonary:  ?   Effort: Pulmonary effort is normal.  ?Musculoskeletal:  ?   Right Lower Extremity: Right leg is amputated below knee.  ?Neurological:  ?   Mental Status: He is alert and oriented to person, place, and time.  ?Psychiatric:     ?   Mood and Affect: Mood normal.     ?   Behavior: Behavior normal.     ?   Thought Content: Thought content normal.     ?   Judgment: Judgment normal.  ? ? ?BP 111/72 (BP Location: Right Arm)   Pulse 96  ? ?Past  Medical History:  ?Diagnosis Date  ? CAD (coronary artery disease)   ? a. 01/2010 PCI of LAD; b. 09/2014 PCI/DES of mLAD due to ISR. D1 80, D2 80(jailed), LCX 50m; c. 07/2016 NSTEMI/Cath: LM nl, LAD 20m ISR, 40d, D1 90ost, 80p, D2 90ost, RI min irregs, LCX min irregs, OM1 90 small, OM2/3 min irregs, RCA min irregs, RPLB1 90, EF 25-35%.  ? Chronic combined systolic and diastolic CHF (congestive heart failure) (HCC)   ? a. 07/2016 Echo: EF 30%, severe septal/anterior HK, Gr1 DD.  ? CKD (chronic kidney disease), stage III (HCC)   ? COPD (chronic obstructive pulmonary disease) (HCC)   ? DM2 (diabetes mellitus, type 2) (HCC)   ? Erectile dysfunction   ? HLD (hyperlipidemia)   ? HTN (hypertension)   ? Ischemic cardiomyopathy   ? a. 07/2016 Echo: EF 30% w/ sev septal/ant HK. Gr1 DD.  ? ? ?Social History  ? ?Socioeconomic History  ? Marital status: Single  ?  Spouse name: Not on file  ? Number of children: Not on file  ? Years of education: Not on file  ? Highest education level: Not on file  ?Occupational History  ? Not on file  ?Tobacco Use  ? Smoking status: Former  ?  Packs/day: 3.00  ?  Years: 0.00  ?  Pack years:   0.00  ?  Types: Cigarettes  ?  Quit date: 09/07/2009  ?  Years since quitting: 12.1  ? Smokeless tobacco: Never  ? Tobacco comments:  ?  quit june 2011  ?Vaping Use  ? Vaping Use: Never used  ?Substance and Sexual Activity  ? Alcohol use: Yes  ?  Comment: every other weekend  ? Drug use: No  ? Sexual activity: Not on file  ?Other Topics Concern  ? Not on file  ?Social History Narrative  ? Not on file  ? ?Social Determinants of Health  ? ?Financial Resource Strain: Low Risk   ? Difficulty of Paying Living Expenses: Not hard at all  ?Food Insecurity: No Food Insecurity  ? Worried About Charity fundraiser in the Last Year: Never true  ? Ran Out of Food in the Last Year: Never true  ?Transportation Needs: Unmet Transportation Needs  ? Lack of Transportation (Medical): Yes  ? Lack of Transportation (Non-Medical): Yes   ?Physical Activity: Not on file  ?Stress: No Stress Concern Present  ? Feeling of Stress : Only a little  ?Social Connections: Unknown  ? Frequency of Communication with Friends and Family: Not on file  ? Frequency of Social Gatherings with Friends and Family: More than three times a week  ? Attends Religious Services: Not on file  ? Active Member of Clubs or Organizations: Not on file  ? Attends Archivist Meetings: Not on file  ? Marital Status: Not on file  ?Intimate Partner Violence: Not At Risk  ? Fear of Current or Ex-Partner: No  ? Emotionally Abused: No  ? Physically Abused: No  ? Sexually Abused: No  ? ? ?Past Surgical History:  ?Procedure Laterality Date  ? AMPUTATION Left 11/10/2020  ? Procedure: AMPUTATION BELOW KNEE;  Surgeon: [name], MD;  Location: [facility];  Service: General;  Laterality: Left;  ? BELOW KNEE LEG AMPUTATION    ? CAD: stent to the LAD    ? CARDIAC CATHETERIZATION  10/01/2014  ? CARDIAC CATHETERIZATION N/A 07/23/2016  ? Procedure: Left Heart Cath and Coronary Angiography;  Surgeon: [name], MD;  Location: [facility];  Service: Cardiovascular;  Laterality: N/A;  ? CORONARY ANGIOPLASTY WITH STENT PLACEMENT  10/01/2014  ? LOWER EXTREMITY ANGIOGRAPHY Left 10/31/2020  ? Procedure: LOWER EXTREMITY ANGIOGRAPHY;  Surgeon: [name], MD;  [facility];  Service: Cardiovascular;  Laterality: Left;  ? ? ?Family History  ?Problem Relation Age of Onset  ? Heart attack Father   ?     complications  ? Cancer Brother   ? ? ?No Known Allergies ? ? ?  Latest Ref Rng & Units 10/10/2021  ?  1:18 AM 10/07/2021  ? 12:48 AM 10/06/2021  ?  3:55 AM  ?CBC  ?WBC 4.0 - 10.5 K/uL 10.1   9.2   8.6    ?Hemoglobin 13.0 - 17.0 g/dL 14.2   15.0   15.2    ?Hematocrit 39.0 - 52.0 % 43.2   45.5   47.7    ?Platelets 150 - 400 K/uL 266   243   227    ? ? ? ? ?CMP  ?   ?Component Value Date/Time  ? NA 134 (L) 10/10/2021 0118  ? NA 135 11/23/2014 2006  ? K 3.8 10/10/2021 0118  ? K  4.0 11/23/2014 2006  ? CL 102 10/10/2021 0118  ? CL 100 (L) 11/23/2014 2006  ? CO2 24 10/10/2021  0118  ? CO2 24 11/23/2014 2006  ? GLUCOSE 124 (H) 10/10/2021 0118  ? GLUCOSE 370 (H) 11/23/2014 2006  ? BUN 27 (H) 10/10/2021 0118  ? BUN 40 (H) 11/23/2014 2006  ? CREATININE 1.21 10/10/2021 0118  ? CREATININE 2.16 (H) 11/23/2014 2006  ? CALCIUM 8.9 10/10/2021 0118  ? CALCIUM 8.6 (L) 11/23/2014 2006  ? PROT 7.2 09/30/2021 1055  ? PROT 7.3 11/23/2014 2006  ? ALBUMIN 4.0 09/30/2021 1055  ? ALBUMIN 4.0 11/23/2014 2006  ? AST 23 09/30/2021 1055  ? AST 18 11/23/2014 2006  ? ALT 25 09/30/2021 1055  ? ALT 14 (L) 11/23/2014 2006  ? ALKPHOS 98 09/30/2021 1055  ? ALKPHOS 96 11/23/2014 2006  ? BILITOT 0.8 09/30/2021 1055  ? BILITOT 0.6 11/23/2014 2006  ? GFRNONAA >60 10/10/2021 0118  ? GFRNONAA 31 (L) 11/23/2014 2006  ? GFRAA 37 (L) 12/24/2019 1608  ? GFRAA 36 (L) 11/23/2014 2006  ? ? ? ?No results found. ? ?   ?Assessment & Plan:  ? ?1. 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 Recommend: ? ?The patient has evidence of severe atherosclerotic changes of both lower extremities associated with ulceration and tissue loss of the foot.  This represents a limb threatening ischemia and places the patient at the risk for limb loss. ? ?Patient should undergo angiography of the lower extremities with the hope for intervention for limb salvage.  The risks and benefits as well as the alternative therapies was discussed in detail with the patient.  All questions were answered.  Patient agrees to proceed with angiography. ? ?The patient will follow up with me in the office after the procedure.   ? ?2. Type 2 diabetes mellitus with hyperlipidemia ([place]) ?Continue hypoglycemic medications as already ordered, these medications have been reviewed and there are no changes at this time. ? ?Hgb A1C to be monitored as already arranged by primary service  ? ?3. Pure hypercholesterolemia ?Continue statin as ordered and  reviewed, no changes at this time  ? ? ?Current Outpatient Medications on File Prior to Visit  ?Medication Sig Dispense Refill  ? acetaminophen (TYLENOL) 325 MG tablet Take 2 tablets (650 mg total) by mo

## 2021-11-12 NOTE — H&P (View-Only) (Signed)
? ?Subjective:  ? ? Patient ID: Danny Organ Sr., male    DOB: 1951-02-17, 71 y.o.   MRN: [id number] ?Chief Complaint  ?Patient presents with  ? Follow-up  ?  ultrasound  ? ? ?The patient returns to the office for followup and review of the noninvasive studies. There has been a significant deterioration in the lower extremity symptoms.  The patient notes interval shortening of their claudication distance and development of mild rest pain symptoms. New ulcers or wounds have occurred since the last visit. ? ?There have been no significant changes to the patient's overall health care. ? ?The patient denies amaurosis fugax or recent TIA symptoms. There are no recent neurological changes noted. ?The patient denies history of DVT, PE or superficial thrombophlebitis. ?The patient denies recent episodes of angina or shortness of breath.  ? ?ABI's Rt=1.58 and Lt=n/a (previous ABI's Rt=1.47 and Lt=n/a) ?Duplex US of the lower extremity arterial system shows right distal SFA/proximal popliteal arteries appear near occlusive.  Tibial artery waveforms are dampened monophasic with a nearly flat toe waveform. ? ? ?Review of Systems  ?Musculoskeletal:  Positive for gait problem.  ?Skin:  Positive for wound.  ?All other systems reviewed and are negative. ? ?   ?Objective:  ? Physical Exam ?Vitals reviewed.  ?HENT:  ?   Head: Normocephalic.  ?Cardiovascular:  ?   Rate and Rhythm: Normal rate.  ?   Pulses:     ?     Dorsalis pedis pulses are detected w/ Doppler on the right side.  ?Pulmonary:  ?   Effort: Pulmonary effort is normal.  ?Musculoskeletal:  ?   Right Lower Extremity: Right leg is amputated below knee.  ?Neurological:  ?   Mental Status: He is alert and oriented to person, place, and time.  ?Psychiatric:     ?   Mood and Affect: Mood normal.     ?   Behavior: Behavior normal.     ?   Thought Content: Thought content normal.     ?   Judgment: Judgment normal.  ? ? ?BP 111/72 (BP Location: Right Arm)   Pulse 96  ? ?Past  Medical History:  ?Diagnosis Date  ? CAD (coronary artery disease)   ? a. 01/2010 PCI of LAD; b. 09/2014 PCI/DES of mLAD due to ISR. D1 80, D2 80(jailed), LCX 47m; c. 07/2016 NSTEMI/Cath: LM nl, LAD 31m ISR, 40d, D1 90ost, 80p, D2 90ost, RI min irregs, LCX min irregs, OM1 90 small, OM2/3 min irregs, RCA min irregs, RPLB1 90, EF 25-35%.  ? Chronic combined systolic and diastolic CHF (congestive heart failure) ([place])   ? a. 07/2016 Echo: EF 30%, severe septal/anterior HK, Gr1 DD.  ? CKD (chronic kidney disease), stage III ([place])   ? COPD (chronic obstructive pulmonary disease) ([place])   ? DM2 (diabetes mellitus, type 2) ([place])   ? Erectile dysfunction   ? HLD (hyperlipidemia)   ? HTN (hypertension)   ? Ischemic cardiomyopathy   ? a. 07/2016 Echo: EF 30% w/ sev septal/ant HK. Gr1 DD.  ? ? ?Social History  ? ?Socioeconomic History  ? Marital status: Single  ?  Spouse name: Not on file  ? Number of children: Not on file  ? Years of education: Not on file  ? Highest education level: Not on file  ?Occupational History  ? Not on file  ?Tobacco Use  ? Smoking status: Former  ?  Packs/day: 3.00  ?  Years: 0.00  ?  Pack years:  0.00  ?  Types: Cigarettes  ?  Quit date: 09/07/2009  ?  Years since quitting: 12.1  ? Smokeless tobacco: Never  ? Tobacco comments:  ?  quit june 2011  ?Vaping Use  ? Vaping Use: Never used  ?Substance and Sexual Activity  ? Alcohol use: Yes  ?  Comment: every other weekend  ? Drug use: No  ? Sexual activity: Not on file  ?Other Topics Concern  ? Not on file  ?Social History Narrative  ? Not on file  ? ?Social Determinants of Health  ? ?Financial Resource Strain: Low Risk   ? Difficulty of Paying Living Expenses: Not hard at all  ?Food Insecurity: No Food Insecurity  ? Worried About Charity fundraiser in the Last Year: Never true  ? Ran Out of Food in the Last Year: Never true  ?Transportation Needs: Unmet Transportation Needs  ? Lack of Transportation (Medical): Yes  ? Lack of Transportation (Non-Medical): Yes   ?Physical Activity: Not on file  ?Stress: No Stress Concern Present  ? Feeling of Stress : Only a little  ?Social Connections: Unknown  ? Frequency of Communication with Friends and Family: Not on file  ? Frequency of Social Gatherings with Friends and Family: More than three times a week  ? Attends Religious Services: Not on file  ? Active Member of Clubs or Organizations: Not on file  ? Attends Archivist Meetings: Not on file  ? Marital Status: Not on file  ?Intimate Partner Violence: Not At Risk  ? Fear of Current or Ex-Partner: No  ? Emotionally Abused: No  ? Physically Abused: No  ? Sexually Abused: No  ? ? ?Past Surgical History:  ?Procedure Laterality Date  ? AMPUTATION Left 11/10/2020  ? Procedure: AMPUTATION BELOW KNEE;  Surgeon: [name], MD;  Location: [facility];  Service: General;  Laterality: Left;  ? BELOW KNEE LEG AMPUTATION    ? CAD: stent to the LAD    ? CARDIAC CATHETERIZATION  10/01/2014  ? CARDIAC CATHETERIZATION N/A 07/23/2016  ? Procedure: Left Heart Cath and Coronary Angiography;  Surgeon: [name], MD;  Location: [facility];  Service: Cardiovascular;  Laterality: N/A;  ? CORONARY ANGIOPLASTY WITH STENT PLACEMENT  10/01/2014  ? LOWER EXTREMITY ANGIOGRAPHY Left 10/31/2020  ? Procedure: LOWER EXTREMITY ANGIOGRAPHY;  Surgeon: [name], MD;  Location: [facility];  Service: Cardiovascular;  Laterality: Left;  ? ? ?Family History  ?Problem Relation Age of Onset  ? Heart attack Father   ?     complications  ? Cancer Brother   ? ? ?No Known Allergies ? ? ?  Latest Ref Rng & Units 10/10/2021  ?  1:18 AM 10/07/2021  ? 12:48 AM 10/06/2021  ?  3:55 AM  ?CBC  ?WBC 4.0 - 10.5 K/uL 10.1   9.2   8.6    ?Hemoglobin 13.0 - 17.0 g/dL 14.2   15.0   15.2    ?Hematocrit 39.0 - 52.0 % 43.2   45.5   47.7    ?Platelets 150 - 400 K/uL 266   243   227    ? ? ? ? ?CMP  ?   ?Component Value Date/Time  ? NA 134 (L) 10/10/2021 0118  ? NA 135 11/23/2014 2006  ? K 3.8 10/10/2021 0118  ? K  4.0 11/23/2014 2006  ? CL 102 10/10/2021 0118  ? CL 100 (L) 11/23/2014 2006  ? CO2 24 10/10/2021  0118  ? CO2 24 11/23/2014 2006  ? GLUCOSE 124 (H) 10/10/2021 0118  ? GLUCOSE 370 (H) 11/23/2014 2006  ? BUN 27 (H) 10/10/2021 0118  ? BUN 40 (H) 11/23/2014 2006  ? CREATININE 1.21 10/10/2021 0118  ? CREATININE 2.16 (H) 11/23/2014 2006  ? CALCIUM 8.9 10/10/2021 0118  ? CALCIUM 8.6 (L) 11/23/2014 2006  ? PROT 7.2 09/30/2021 1055  ? PROT 7.3 11/23/2014 2006  ? ALBUMIN 4.0 09/30/2021 1055  ? ALBUMIN 4.0 11/23/2014 2006  ? AST 23 09/30/2021 1055  ? AST 18 11/23/2014 2006  ? ALT 25 09/30/2021 1055  ? ALT 14 (L) 11/23/2014 2006  ? ALKPHOS 98 09/30/2021 1055  ? ALKPHOS 96 11/23/2014 2006  ? BILITOT 0.8 09/30/2021 1055  ? BILITOT 0.6 11/23/2014 2006  ? GFRNONAA >60 10/10/2021 0118  ? GFRNONAA 31 (L) 11/23/2014 2006  ? GFRAA 37 (L) 12/24/2019 1608  ? GFRAA 36 (L) 11/23/2014 2006  ? ? ? ?No results found. ? ?   ?Assessment & Plan:  ? ?1. 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 Recommend: ? ?The patient has evidence of severe atherosclerotic changes of both lower extremities associated with ulceration and tissue loss of the foot.  This represents a limb threatening ischemia and places the patient at the risk for limb loss. ? ?Patient should undergo angiography of the lower extremities with the hope for intervention for limb salvage.  The risks and benefits as well as the alternative therapies was discussed in detail with the patient.  All questions were answered.  Patient agrees to proceed with angiography. ? ?The patient will follow up with me in the office after the procedure.   ? ?2. Type 2 diabetes mellitus with hyperlipidemia ([place]) ?Continue hypoglycemic medications as already ordered, these medications have been reviewed and there are no changes at this time. ? ?Hgb A1C to be monitored as already arranged by primary service  ? ?3. Pure hypercholesterolemia ?Continue statin as ordered and  reviewed, no changes at this time  ? ? ?Current Outpatient Medications on File Prior to Visit  ?Medication Sig Dispense Refill  ? acetaminophen (TYLENOL) 325 MG tablet Take 2 tablets (650 mg total) by mo

## 2021-11-12 NOTE — Progress Notes (Signed)
? ?Subjective:  ? ? Patient ID: Danny Organ Sr., male    DOB: 09-28-1950, 71 y.o.   MRN: [id number] ?Chief Complaint  ?Patient presents with  ? Follow-up  ?  ultrasound  ? ? ?The patient returns to the office for followup and review of the noninvasive studies. There has been a significant deterioration in the lower extremity symptoms.  The patient notes interval shortening of their claudication distance and development of mild rest pain symptoms. New ulcers or wounds have occurred since the last visit. ? ?There have been no significant changes to the patient's overall health care. ? ?The patient denies amaurosis fugax or recent TIA symptoms. There are no recent neurological changes noted. ?The patient denies history of DVT, PE or superficial thrombophlebitis. ?The patient denies recent episodes of angina or shortness of breath.  ? ?ABI's Rt=1.58 and Lt=n/a (previous ABI's Rt=1.47 and Lt=n/a) ?Duplex US of the lower extremity arterial system shows right distal SFA/proximal popliteal arteries appear near occlusive.  Tibial artery waveforms are dampened monophasic with a nearly flat toe waveform. ? ? ?Review of Systems  ?Musculoskeletal:  Positive for gait problem.  ?Skin:  Positive for wound.  ?All other systems reviewed and are negative. ? ?   ?Objective:  ? Physical Exam ?Vitals reviewed.  ?HENT:  ?   Head: Normocephalic.  ?Cardiovascular:  ?   Rate and Rhythm: Normal rate.  ?   Pulses:     ?     Dorsalis pedis pulses are detected w/ Doppler on the right side.  ?Pulmonary:  ?   Effort: Pulmonary effort is normal.  ?Musculoskeletal:  ?   Right Lower Extremity: Right leg is amputated below knee.  ?Neurological:  ?   Mental Status: He is alert and oriented to person, place, and time.  ?Psychiatric:     ?   Mood and Affect: Mood normal.     ?   Behavior: Behavior normal.     ?   Thought Content: Thought content normal.     ?   Judgment: Judgment normal.  ? ? ?BP 111/72 (BP Location: Right Arm)   Pulse 96  ? ?Past  Medical History:  ?Diagnosis Date  ? CAD (coronary artery disease)   ? a. 01/2010 PCI of LAD; b. 09/2014 PCI/DES of mLAD due to ISR. D1 80, D2 80(jailed), LCX 44m; c. 07/2016 NSTEMI/Cath: LM nl, LAD 79m ISR, 40d, D1 90ost, 80p, D2 90ost, RI min irregs, LCX min irregs, OM1 90 small, OM2/3 min irregs, RCA min irregs, RPLB1 90, EF 25-35%.  ? Chronic combined systolic and diastolic CHF (congestive heart failure) ([place])   ? a. 07/2016 Echo: EF 30%, severe septal/anterior HK, Gr1 DD.  ? CKD (chronic kidney disease), stage III ([place])   ? COPD (chronic obstructive pulmonary disease) ([place])   ? DM2 (diabetes mellitus, type 2) ([place])   ? Erectile dysfunction   ? HLD (hyperlipidemia)   ? HTN (hypertension)   ? Ischemic cardiomyopathy   ? a. 07/2016 Echo: EF 30% w/ sev septal/ant HK. Gr1 DD.  ? ? ?Social History  ? ?Socioeconomic History  ? Marital status: Single  ?  Spouse name: Not on file  ? Number of children: Not on file  ? Years of education: Not on file  ? Highest education level: Not on file  ?Occupational History  ? Not on file  ?Tobacco Use  ? Smoking status: Former  ?  Packs/day: 3.00  ?  Years: 0.00  ?  Pack years:  0.00  ?  Types: Cigarettes  ?  Quit date: 09/07/2009  ?  Years since quitting: 12.1  ? Smokeless tobacco: Never  ? Tobacco comments:  ?  quit june 2011  ?Vaping Use  ? Vaping Use: Never used  ?Substance and Sexual Activity  ? Alcohol use: Yes  ?  Comment: every other weekend  ? Drug use: No  ? Sexual activity: Not on file  ?Other Topics Concern  ? Not on file  ?Social History Narrative  ? Not on file  ? ?Social Determinants of Health  ? ?Financial Resource Strain: Low Risk   ? Difficulty of Paying Living Expenses: Not hard at all  ?Food Insecurity: No Food Insecurity  ? Worried About Charity fundraiser in the Last Year: Never true  ? Ran Out of Food in the Last Year: Never true  ?Transportation Needs: Unmet Transportation Needs  ? Lack of Transportation (Medical): Yes  ? Lack of Transportation (Non-Medical): Yes   ?Physical Activity: Not on file  ?Stress: No Stress Concern Present  ? Feeling of Stress : Only a little  ?Social Connections: Unknown  ? Frequency of Communication with Friends and Family: Not on file  ? Frequency of Social Gatherings with Friends and Family: More than three times a week  ? Attends Religious Services: Not on file  ? Active Member of Clubs or Organizations: Not on file  ? Attends Archivist Meetings: Not on file  ? Marital Status: Not on file  ?Intimate Partner Violence: Not At Risk  ? Fear of Current or Ex-Partner: No  ? Emotionally Abused: No  ? Physically Abused: No  ? Sexually Abused: No  ? ? ?Past Surgical History:  ?Procedure Laterality Date  ? AMPUTATION Left 11/10/2020  ? Procedure: AMPUTATION BELOW KNEE;  Surgeon: [name], MD;  Location: [facility] ORS;  Service: General;  Laterality: Left;  ? BELOW KNEE LEG AMPUTATION    ? CAD: stent to the LAD    ? CARDIAC CATHETERIZATION  10/01/2014  ? CARDIAC CATHETERIZATION N/A 07/23/2016  ? Procedure: Left Heart Cath and Coronary Angiography;  Surgeon: [name], MD;  Location: [facility];  Service: Cardiovascular;  Laterality: N/A;  ? CORONARY ANGIOPLASTY WITH STENT PLACEMENT  10/01/2014  ? LOWER EXTREMITY ANGIOGRAPHY Left 10/31/2020  ? Procedure: LOWER EXTREMITY ANGIOGRAPHY;  Surgeon: [name], MD;  Location: [facility];  Service: Cardiovascular;  Laterality: Left;  ? ? ?Family History  ?Problem Relation Age of Onset  ? Heart attack Father   ?     complications  ? Cancer Brother   ? ? ?No Known Allergies ? ? ?  Latest Ref Rng & Units 10/10/2021  ?  1:18 AM 10/07/2021  ? 12:48 AM 10/06/2021  ?  3:55 AM  ?CBC  ?WBC 4.0 - 10.5 K/uL 10.1   9.2   8.6    ?Hemoglobin 13.0 - 17.0 g/dL 14.2   15.0   15.2    ?Hematocrit 39.0 - 52.0 % 43.2   45.5   47.7    ?Platelets 150 - 400 K/uL 266   243   227    ? ? ? ? ?CMP  ?   ?Component Value Date/Time  ? NA 134 (L) 10/10/2021 0118  ? NA 135 11/23/2014 2006  ? K 3.8 10/10/2021 0118  ? K  4.0 11/23/2014 2006  ? CL 102 10/10/2021 0118  ? CL 100 (L) 11/23/2014 2006  ? CO2 24 10/10/2021  0118  ? CO2 24 11/23/2014 2006  ? GLUCOSE 124 (H) 10/10/2021 0118  ? GLUCOSE 370 (H) 11/23/2014 2006  ? BUN 27 (H) 10/10/2021 0118  ? BUN 40 (H) 11/23/2014 2006  ? CREATININE 1.21 10/10/2021 0118  ? CREATININE 2.16 (H) 11/23/2014 2006  ? CALCIUM 8.9 10/10/2021 0118  ? CALCIUM 8.6 (L) 11/23/2014 2006  ? PROT 7.2 09/30/2021 1055  ? PROT 7.3 11/23/2014 2006  ? ALBUMIN 4.0 09/30/2021 1055  ? ALBUMIN 4.0 11/23/2014 2006  ? AST 23 09/30/2021 1055  ? AST 18 11/23/2014 2006  ? ALT 25 09/30/2021 1055  ? ALT 14 (L) 11/23/2014 2006  ? ALKPHOS 98 09/30/2021 1055  ? ALKPHOS 96 11/23/2014 2006  ? BILITOT 0.8 09/30/2021 1055  ? BILITOT 0.6 11/23/2014 2006  ? GFRNONAA >60 10/10/2021 0118  ? GFRNONAA 31 (L) 11/23/2014 2006  ? GFRAA 37 (L) 12/24/2019 1608  ? GFRAA 36 (L) 11/23/2014 2006  ? ? ? ?No results found. ? ?   ?Assessment & Plan:  ? ?1. 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 Recommend: ? ?The patient has evidence of severe atherosclerotic changes of both lower extremities associated with ulceration and tissue loss of the foot.  This represents a limb threatening ischemia and places the patient at the risk for limb loss. ? ?Patient should undergo angiography of the lower extremities with the hope for intervention for limb salvage.  The risks and benefits as well as the alternative therapies was discussed in detail with the patient.  All questions were answered.  Patient agrees to proceed with angiography. ? ?The patient will follow up with me in the office after the procedure.   ? ?2. Type 2 diabetes mellitus with hyperlipidemia ([place]) ?Continue hypoglycemic medications as already ordered, these medications have been reviewed and there are no changes at this time. ? ?Hgb A1C to be monitored as already arranged by primary service  ? ?3. Pure hypercholesterolemia ?Continue statin as ordered and  reviewed, no changes at this time  ? ? ?Current Outpatient Medications on File Prior to Visit  ?Medication Sig Dispense Refill  ? acetaminophen (TYLENOL) 325 MG tablet Take 2 tablets (650 mg total) by mo

## 2021-11-13 ENCOUNTER — Other Ambulatory Visit: Payer: Self-pay

## 2021-11-13 ENCOUNTER — Ambulatory Visit
Admission: RE | Admit: 2021-11-13 | Discharge: 2021-11-13 | Disposition: A | Discharge status: Skilled Nursing Facility | Payer: Medicare Other | Attending: Vascular Surgery | Admitting: Vascular Surgery

## 2021-11-13 ENCOUNTER — Encounter
Admission: RE | Disposition: A | Discharge status: Skilled Nursing Facility | Payer: Self-pay | Source: Home / Self Care | Attending: Vascular Surgery

## 2021-11-13 ENCOUNTER — Encounter: Payer: Self-pay | Admitting: Vascular Surgery

## 2021-11-13 ENCOUNTER — Encounter (INDEPENDENT_AMBULATORY_CARE_PROVIDER_SITE_OTHER): Payer: Self-pay

## 2021-11-13 ENCOUNTER — Telehealth (INDEPENDENT_AMBULATORY_CARE_PROVIDER_SITE_OTHER): Payer: Self-pay

## 2021-11-13 DIAGNOSIS — Z1159 Encounter for screening for other viral diseases: Secondary | ICD-10-CM | POA: Diagnosis not present

## 2021-11-13 DIAGNOSIS — I5042 Chronic combined systolic (congestive) and diastolic (congestive) heart failure: Secondary | ICD-10-CM | POA: Diagnosis not present

## 2021-11-13 DIAGNOSIS — M6281 Muscle weakness (generalized): Secondary | ICD-10-CM | POA: Diagnosis not present

## 2021-11-13 DIAGNOSIS — I2510 Atherosclerotic heart disease of native coronary artery without angina pectoris: Secondary | ICD-10-CM | POA: Diagnosis not present

## 2021-11-13 DIAGNOSIS — L97909 Non-pressure chronic ulcer of unspecified part of unspecified lower leg with unspecified severity: Secondary | ICD-10-CM

## 2021-11-13 DIAGNOSIS — N183 Chronic kidney disease, stage 3 unspecified: Secondary | ICD-10-CM | POA: Diagnosis not present

## 2021-11-13 DIAGNOSIS — J439 Emphysema, unspecified: Secondary | ICD-10-CM | POA: Diagnosis not present

## 2021-11-13 DIAGNOSIS — E785 Hyperlipidemia, unspecified: Secondary | ICD-10-CM | POA: Diagnosis not present

## 2021-11-13 DIAGNOSIS — I10 Essential (primary) hypertension: Secondary | ICD-10-CM | POA: Diagnosis not present

## 2021-11-13 DIAGNOSIS — I615 Nontraumatic intracerebral hemorrhage, intraventricular: Secondary | ICD-10-CM | POA: Diagnosis not present

## 2021-11-13 DIAGNOSIS — J449 Chronic obstructive pulmonary disease, unspecified: Secondary | ICD-10-CM | POA: Diagnosis not present

## 2021-11-13 DIAGNOSIS — F32A Depression, unspecified: Secondary | ICD-10-CM | POA: Diagnosis not present

## 2021-11-13 DIAGNOSIS — Z89512 Acquired absence of left leg below knee: Secondary | ICD-10-CM | POA: Diagnosis not present

## 2021-11-13 SURGERY — LOWER EXTREMITY ANGIOGRAPHY
Anesthesia: Moderate Sedation | Site: Leg Lower | Laterality: Right

## 2021-11-13 MED ORDER — MIDAZOLAM HCL 2 MG/ML PO SYRP: 8.0000 mg | ORAL_SOLUTION | Freq: Once | ORAL | Status: DC | PRN

## 2021-11-13 MED ORDER — METHYLPREDNISOLONE SODIUM SUCC 125 MG IJ SOLR: 125.0000 mg | Freq: Once | INTRAMUSCULAR | Status: DC | PRN

## 2021-11-13 MED ORDER — FAMOTIDINE 20 MG PO TABS: 40.0000 mg | ORAL_TABLET | Freq: Once | ORAL | Status: DC | PRN

## 2021-11-13 MED ORDER — DIPHENHYDRAMINE HCL 50 MG/ML IJ SOLN
50.0000 mg | Freq: Once | INTRAMUSCULAR | Status: DC | PRN
Start: 2021-11-13 — End: 2021-11-13

## 2021-11-13 MED ORDER — SODIUM CHLORIDE 0.9 % IV SOLN: INTRAVENOUS | Status: DC

## 2021-11-13 MED ORDER — CEFAZOLIN SODIUM-DEXTROSE 2-4 GM/100ML-% IV SOLN: 2.0000 g | Freq: Once | INTRAVENOUS | Status: DC

## 2021-11-13 NOTE — Interval H&P Note (Signed)
History and Physical Interval Note: ? ?11/13/2021 ?11:47 AM ? ?Danny Friends Palardy Sr.  has presented today for surgery, with the diagnosis of RLE Angio   BARD   ASO w ulceration.  The various methods of treatment have been discussed with the patient and family. After consideration of risks, benefits and other options for treatment, the patient has consented to  Procedure(s): ?Lower Extremity Angiography (Right) as a surgical intervention.  The patient's history has been reviewed, patient examined, no change in status, stable for surgery.  I have reviewed the patient's chart and labs.  Questions were answered to the patient's satisfaction.   ? ? ?Festus Barren ? ? ?

## 2021-11-13 NOTE — Telephone Encounter (Signed)
Patient right lower extremity angiogram is reschedule to 11/20/21 at 8 am with arrival time 6:45 am. I spoke with Wylaundria from [facility] and she was made aware with new procedure date and time. A new procedure letter will be fax to [phone].  ?

## 2021-11-14 ENCOUNTER — Ambulatory Visit (INDEPENDENT_AMBULATORY_CARE_PROVIDER_SITE_OTHER): Payer: Medicare Other | Admitting: Family Medicine

## 2021-11-14 ENCOUNTER — Encounter: Payer: Self-pay | Admitting: Family Medicine

## 2021-11-14 VITALS — BP 91/60 | HR 90 | Ht 72.0 in | Wt 226.0 lb

## 2021-11-14 DIAGNOSIS — I615 Nontraumatic intracerebral hemorrhage, intraventricular: Secondary | ICD-10-CM | POA: Diagnosis not present

## 2021-11-14 DIAGNOSIS — M6281 Muscle weakness (generalized): Secondary | ICD-10-CM | POA: Diagnosis not present

## 2021-11-14 DIAGNOSIS — Z1159 Encounter for screening for other viral diseases: Secondary | ICD-10-CM | POA: Diagnosis not present

## 2021-11-14 NOTE — Patient Instructions (Signed)
Below is our plan: ? ?ICH vs HT with IVH : Residual deficit: none. Continue atorvastatin 80mg  daily for secondary stroke prevention. Discussed secondary stroke prevention measures and importance of close PCP follow up for aggressive stroke risk factor management. I have gone over the pathophysiology of stroke, warning signs and symptoms, risk factors and their management in some detail with instructions to go to the closest emergency room for symptoms of concern. ?HTN: BP goal <130/90.  Current BP 91/60 on amlodipine 5mg , carvedilol , isosorbide 30mg  QD, Lasix 20mg  QOD, Entresto 49-51 BID per PCP ?HLD: LDL goal <70. Recent LDL 39 on atorvastatin 80mg  QD per PCP.  ?DMII: A1c goal<7.0. Recent A1c 7.3. Continue Novolog Insulin per sliding scale, Trulicity once weekly, metformin 500mg  BID, Jardiance 25mg  QD per PCP/cards. Continue close follow up PCP and cardiology.  ? ?Please make sure you are staying well hydrated. I recommend 50-60 ounces daily. Well balanced diet and regular exercise encouraged. Consistent sleep schedule with 6-8 hours recommended.  ? ?Please continue follow up with care team as directed.  ? ?Follow up with me in 6 months  ? ?You may receive a survey regarding today's visit. I encourage you to leave honest feed back as I do use this information to improve patient care. Thank you for seeing me today!  ? ? ?

## 2021-11-14 NOTE — Progress Notes (Signed)
?[facility] ?[address] ?[place]. [place] [address] ?[phone] ? ?     HOSPITAL FOLLOW UP NOTE ? ?Mr. Danny Estimable Pesnell Sr. ?Date of Birth:  [birth year] ?Medical Record Number:  [id number]  ? ?Reason for Referral:  hospital stroke follow up ? ? ? ?SUBJECTIVE: ? ? ?CHIEF COMPLAINT:  ?Chief Complaint  ?Patient presents with  ? Follow-up  ?  Rm 1, alone. Here for recent hospital visit. Pt reports having HA and being sleepy.   ? ? ?HPI:  ? ?Danny Estimable Magnone Sr. is a 71 y.o. who  has a past medical history of CAD (coronary artery disease), Chronic combined systolic and diastolic CHF (congestive heart failure) ([place]), CKD (chronic kidney disease), stage III ([place]), COPD (chronic obstructive pulmonary disease) ([place]), DM2 (diabetes mellitus, type 2) ([place]), Erectile dysfunction, HLD (hyperlipidemia), HTN (hypertension), and Ischemic cardiomyopathy.  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 Patient presented on 09/30/2021 with altered consciousness. He lives with his son and last seen normal 2-3 days prior. Son had been admitted to hospital for intracranial hemorrhage around that time. Romas was found by another family member. He was on the floor and more confused than normal. Family transported him to [facility] where CT showed [place] with intraventricular extension. He was transferred to [facility]. Plavix and asa held. Repeat CT stable. MRI showed absent flow rate vertebral and proximal basilar artery with distal reconstitution, punctate hemorrhage, unchanged intracranial hemorrhage. CTA of the head and neck, occluded left before, chronically stenosis right vertebral artery and severe stenosis at B1 and B2. Hospitalization was fairly uneventful. He did develop some headaches easily managed with Fioricet/tramadol PRN. He was discharged to SNF. Personally reviewed hospitalization pertinent progress notes, lab work and imaging.  Evaluated by Dr Erlinda Hong.  ? ?Since, he reports doing well. He presents, alone, and history  limited. He is A&Ox4 but caution used with HPI due to history of dementia. He denies residual weakness. He is s/p left BKA in 11/2020 and recently received prosthetic leg. 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 He is able to walk short distance with PT assistance. He uses wheelchair. BP is a little low, today. He reports being asymptomatic. Unclear what readings are at SNF.  ? ?He quit smoking 12 years ago. He denies drug or alcohol use.  ? ?PERTINENT IMAGING/LABS ? ?Code Stroke Head CT- 12 mm focus of acute hemorrhage within the periventricular white matter adjacent to the body of the right lateral ventricle. Intraventricular hemorrhage appears to reflect extension of this hematoma. There is resulting mild communicating hydrocephalus. ?Repeat head CT- Unchanged parenchymal and intraventricular hemorrhage. Lateral ventriculomegaly is mildly improved. ?CTA head and neck occluded left V4 segment, likely chronic and stenosis of right vertebral artery with severe stenosis at V1 and V2 segments. ?MRI  absent flow void in right vertebral and proximal basilar artery with distal reconstitution, fetal PCAs, punctate acute infarct in right frontal cortex, unchanged ICH ?2D Echo no atrial level shunt, EF not reported ? ? ?A1C ?Lab Results  ?Component Value Date  ? HGBA1C 7.3 (H) 10/03/2021  ? ? ?Lipid Panel  ?   ?Component Value Date/Time  ? CHOL 112 10/03/2021 1018  ? TRIG 147 10/03/2021 1018  ? HDL 44 10/03/2021 1018  ? CHOLHDL 2.5 10/03/2021 1018  ? VLDL 29 10/03/2021 1018  ? LDLCALC 39 10/03/2021 1018  ? LDLDIRECT 64.0 03/20/2018 0950  ? ? ? ? ?ROS:   ?14 system review of systems performed and negative with exception of  those listed in HPI ? ?PMH:  ?Past Medical History:  ?Diagnosis Date  ? CAD (coronary artery disease)   ? a. 01/2010 PCI of LAD; b. 09/2014 PCI/DES of mLAD due to ISR. D1 80, D2 80(jailed), LCX 9m; c. 07/2016 NSTEMI/Cath: LM nl, LAD 48m ISR, 40d, D1 90ost, 80p, D2 90ost, RI min irregs, LCX min irregs,  OM1 90 small, OM2/3 min irregs, RCA min irregs, RPLB1 90, EF 25-35%.  ? Chronic combined systolic and diastolic CHF (congestive heart failure) ([place])   ? a. 07/2016 Echo: EF 30%, severe septal/anterior HK, Gr1 DD.  ? CKD (chronic kidney disease), stage III ([place])   ? COPD (chronic obstructive pulmonary disease) ([place])   ? DM2 (diabetes mellitus, type 2) ([place])   ? Erectile dysfunction   ? HLD (hyperlipidemia)   ? HTN (hypertension)   ? Ischemic cardiomyopathy   ? a. 07/2016 Echo: EF 30% w/ sev septal/ant HK. Gr1 DD.  ? ? ?PSH:  ?Past Surgical History:  ?Procedure Laterality Date  ? AMPUTATION Left 11/10/2020  ? Procedure: AMPUTATION BELOW KNEE;  Surgeon: [name], MD;  Location: [facility] ORS;  Service: General;  Laterality: Left;  ? BELOW KNEE LEG AMPUTATION    ? CAD: stent to the LAD    ? CARDIAC CATHETERIZATION  10/01/2014  ? CARDIAC CATHETERIZATION N/A 07/23/2016  ? Procedure: Left Heart Cath and Coronary Angiography;  Surgeon: [name], MD;  Location: [facility];  Service: Cardiovascular;  Laterality: N/A;  ? CORONARY ANGIOPLASTY WITH STENT PLACEMENT  10/01/2014  ? LOWER EXTREMITY ANGIOGRAPHY Left 10/31/2020  ? Procedure: LOWER EXTREMITY ANGIOGRAPHY;  Surgeon: [name], MD;  Location: [place] [facility];  Service: Cardiovascular;  Laterality: Left;  ? ? ?Social History:  ?Social History  ? ?Socioeconomic History  ? Marital status: Single  ?  Spouse name: Not on file  ? Number of children: Not on file  ? Years of education: Not on file  ? Highest education level: Not on file  ?Occupational History  ? Not on file  ?Tobacco Use  ? Smoking status: Former  ?  Packs/day: 3.00  ?  Years: 0.00  ?  Pack years: 0.00  ?  Types: Cigarettes  ?  Quit date: 09/07/2009  ?  Years since quitting: 12.1  ? Smokeless tobacco: Never  ? Tobacco comments:  ?  quit june 2011  ?Vaping Use  ? Vaping Use: Never used  ?Substance and Sexual Activity  ? Alcohol use: Not Currently  ?  Alcohol/week: 7.0 standard drinks  ?  Types: 6  Cans of beer, 1 Standard drinks or equivalent per week  ?  Comment: every other weekend  ? Drug use: Never  ? Sexual activity: Not on file  ?Other Topics Concern  ? Not on file  ?Social History Narrative  ? Not on file  ? ?Social Determinants of Health  ? ?Financial Resource Strain: Low Risk   ? Difficulty of Paying Living Expenses: Not hard at all  ?Food Insecurity: No Food Insecurity  ? Worried About Charity fundraiser in the Last Year: Never true  ? Ran Out of Food in the Last Year: Never true  ?Transportation Needs: Unmet Transportation Needs  ? Lack of Transportation (Medical): Yes  ? Lack of Transportation (Non-Medical): Yes  ?Physical Activity: Not on file  ?Stress: No Stress Concern Present  ? Feeling of Stress : Only a little  ?Social Connections: Unknown  ? Frequency of Communication with Friends and  Family: Not on file  ? Frequency of Social Gatherings with Friends and Family: More than three times a week  ? Attends Religious Services: Not on file  ? Active Member of Clubs or Organizations: Not on file  ? Attends Archivist Meetings: Not on file  ? Marital Status: Not on file  ?Intimate Partner Violence: Not At Risk  ? Fear of Current or Ex-Partner: No  ? Emotionally Abused: No  ? Physically Abused: No  ? Sexually Abused: No  ? ? ?Family History:  ?Family History  ?Problem Relation Age of Onset  ? Heart attack Father   ?     complications  ? Cancer Brother   ? ? ?Medications:   ?Current Outpatient Medications on File Prior to Visit  ?Medication Sig Dispense Refill  ? albuterol (PROVENTIL) (2.5 MG/3ML) 0.083% nebulizer solution Take 3 mLs (2.5 mg total) by nebulization every 6 (six) hours as needed for wheezing or shortness of breath. 150 mL 1  ? amLODipine (NORVASC) 5 MG tablet Take 1 tablet (5 mg total) by mouth daily. 30 tablet 0  ? atorvastatin (LIPITOR) 80 MG tablet TAKE 1 TABLET BY MOUTH EVERY DAY 90 tablet 3  ? carvedilol (COREG) 25 MG tablet Take 1 tablet (25 mg total) by mouth 2 (two)  times daily with a meal. 60 tablet 0  ? acetaminophen (TYLENOL) 325 MG tablet Take 2 tablets (650 mg total) by mouth every 4 (four) hours as needed for mild pain (or temp > 37.5 C (99.5 F)).    ? Albut

## 2021-11-15 ENCOUNTER — Encounter: Payer: Self-pay | Admitting: Family Medicine

## 2021-11-15 DIAGNOSIS — I5042 Chronic combined systolic (congestive) and diastolic (congestive) heart failure: Secondary | ICD-10-CM | POA: Diagnosis not present

## 2021-11-15 DIAGNOSIS — Z89512 Acquired absence of left leg below knee: Secondary | ICD-10-CM | POA: Diagnosis not present

## 2021-11-15 DIAGNOSIS — J449 Chronic obstructive pulmonary disease, unspecified: Secondary | ICD-10-CM | POA: Diagnosis not present

## 2021-11-15 DIAGNOSIS — I615 Nontraumatic intracerebral hemorrhage, intraventricular: Secondary | ICD-10-CM | POA: Diagnosis not present

## 2021-11-15 DIAGNOSIS — N179 Acute kidney failure, unspecified: Secondary | ICD-10-CM | POA: Diagnosis not present

## 2021-11-15 DIAGNOSIS — N183 Chronic kidney disease, stage 3 unspecified: Secondary | ICD-10-CM | POA: Diagnosis not present

## 2021-11-15 DIAGNOSIS — F32A Depression, unspecified: Secondary | ICD-10-CM | POA: Diagnosis not present

## 2021-11-15 DIAGNOSIS — I739 Peripheral vascular disease, unspecified: Secondary | ICD-10-CM | POA: Diagnosis not present

## 2021-11-15 DIAGNOSIS — I5022 Chronic systolic (congestive) heart failure: Secondary | ICD-10-CM | POA: Diagnosis not present

## 2021-11-15 DIAGNOSIS — E785 Hyperlipidemia, unspecified: Secondary | ICD-10-CM | POA: Diagnosis not present

## 2021-11-15 DIAGNOSIS — I10 Essential (primary) hypertension: Secondary | ICD-10-CM | POA: Diagnosis not present

## 2021-11-15 DIAGNOSIS — I2510 Atherosclerotic heart disease of native coronary artery without angina pectoris: Secondary | ICD-10-CM | POA: Diagnosis not present

## 2021-11-15 DIAGNOSIS — J439 Emphysema, unspecified: Secondary | ICD-10-CM | POA: Diagnosis not present

## 2021-11-15 DIAGNOSIS — R531 Weakness: Secondary | ICD-10-CM | POA: Diagnosis not present

## 2021-11-16 ENCOUNTER — Telehealth: Payer: Self-pay | Admitting: Family Medicine

## 2021-11-16 DIAGNOSIS — I69991 Dysphagia following unspecified cerebrovascular disease: Secondary | ICD-10-CM | POA: Diagnosis not present

## 2021-11-16 DIAGNOSIS — I615 Nontraumatic intracerebral hemorrhage, intraventricular: Secondary | ICD-10-CM | POA: Diagnosis not present

## 2021-11-16 DIAGNOSIS — G5791 Unspecified mononeuropathy of right lower limb: Secondary | ICD-10-CM | POA: Diagnosis not present

## 2021-11-16 NOTE — Telephone Encounter (Signed)
[employer] medicare/medicaid order sent to [facility], [facility] they will reach out to the patient to schedule.  ?

## 2021-11-17 ENCOUNTER — Telehealth: Payer: Self-pay | Admitting: Family Medicine

## 2021-11-17 DIAGNOSIS — N179 Acute kidney failure, unspecified: Secondary | ICD-10-CM | POA: Diagnosis not present

## 2021-11-17 DIAGNOSIS — I5022 Chronic systolic (congestive) heart failure: Secondary | ICD-10-CM | POA: Diagnosis not present

## 2021-11-17 DIAGNOSIS — I739 Peripheral vascular disease, unspecified: Secondary | ICD-10-CM | POA: Diagnosis not present

## 2021-11-17 DIAGNOSIS — I2510 Atherosclerotic heart disease of native coronary artery without angina pectoris: Secondary | ICD-10-CM | POA: Diagnosis not present

## 2021-11-17 DIAGNOSIS — Z89512 Acquired absence of left leg below knee: Secondary | ICD-10-CM | POA: Diagnosis not present

## 2021-11-17 NOTE — Telephone Encounter (Signed)
Tacey Ruiz [facility], [phone]. She spoke to the patient today and was told that patient is not taking his metFORMIN (GLUCOPHAGE-XR) 500 MG 24 hr tablet 2x day. He has side effects, some times he will take 1 pill. Leah just wanted Dr [name] to be aware of this. ?

## 2021-11-17 NOTE — Telephone Encounter (Signed)
Noted. I have not seen this patient in about 10 months. He needs a follow-up with me.  ?

## 2021-11-20 ENCOUNTER — Other Ambulatory Visit: Payer: Self-pay

## 2021-11-20 ENCOUNTER — Ambulatory Visit
Admission: RE | Admit: 2021-11-20 | Discharge: 2021-11-20 | Disposition: A | Discharge status: Home / Self Care | Payer: Medicare Other | Attending: Vascular Surgery | Admitting: Vascular Surgery

## 2021-11-20 ENCOUNTER — Encounter
Admission: RE | Disposition: A | Discharge status: Home / Self Care | Payer: Self-pay | Source: Home / Self Care | Attending: Vascular Surgery

## 2021-11-20 ENCOUNTER — Telehealth: Payer: Self-pay | Admitting: Family Medicine

## 2021-11-20 ENCOUNTER — Encounter: Payer: Self-pay | Admitting: Vascular Surgery

## 2021-11-20 DIAGNOSIS — E11621 Type 2 diabetes mellitus with foot ulcer: Secondary | ICD-10-CM | POA: Insufficient documentation

## 2021-11-20 DIAGNOSIS — Z87891 Personal history of nicotine dependence: Secondary | ICD-10-CM | POA: Diagnosis not present

## 2021-11-20 DIAGNOSIS — E1152 Type 2 diabetes mellitus with diabetic peripheral angiopathy with gangrene: Secondary | ICD-10-CM | POA: Insufficient documentation

## 2021-11-20 DIAGNOSIS — I743 Embolism and thrombosis of arteries of the lower extremities: Secondary | ICD-10-CM | POA: Diagnosis not present

## 2021-11-20 DIAGNOSIS — N183 Chronic kidney disease, stage 3 unspecified: Secondary | ICD-10-CM | POA: Diagnosis not present

## 2021-11-20 DIAGNOSIS — I130 Hypertensive heart and chronic kidney disease with heart failure and stage 1 through stage 4 chronic kidney disease, or unspecified chronic kidney disease: Secondary | ICD-10-CM | POA: Diagnosis not present

## 2021-11-20 DIAGNOSIS — I70235 Atherosclerosis of native arteries of right leg with ulceration of other part of foot: Secondary | ICD-10-CM

## 2021-11-20 DIAGNOSIS — E1122 Type 2 diabetes mellitus with diabetic chronic kidney disease: Secondary | ICD-10-CM | POA: Insufficient documentation

## 2021-11-20 DIAGNOSIS — E7800 Pure hypercholesterolemia, unspecified: Secondary | ICD-10-CM | POA: Diagnosis not present

## 2021-11-20 DIAGNOSIS — L97519 Non-pressure chronic ulcer of other part of right foot with unspecified severity: Secondary | ICD-10-CM | POA: Insufficient documentation

## 2021-11-20 DIAGNOSIS — I5042 Chronic combined systolic (congestive) and diastolic (congestive) heart failure: Secondary | ICD-10-CM | POA: Diagnosis not present

## 2021-11-20 DIAGNOSIS — I7092 Chronic total occlusion of artery of the extremities: Secondary | ICD-10-CM

## 2021-11-20 DIAGNOSIS — Z89512 Acquired absence of left leg below knee: Secondary | ICD-10-CM | POA: Diagnosis not present

## 2021-11-20 DIAGNOSIS — E785 Hyperlipidemia, unspecified: Secondary | ICD-10-CM | POA: Diagnosis not present

## 2021-11-20 DIAGNOSIS — E1169 Type 2 diabetes mellitus with other specified complication: Secondary | ICD-10-CM | POA: Diagnosis not present

## 2021-11-20 DIAGNOSIS — L97909 Non-pressure chronic ulcer of unspecified part of unspecified lower leg with unspecified severity: Secondary | ICD-10-CM

## 2021-11-20 HISTORY — PX: LOWER EXTREMITY ANGIOGRAPHY: CATH118251

## 2021-11-20 LAB — BUN: BUN: 31 mg/dL — ABNORMAL HIGH (ref 8–23)

## 2021-11-20 LAB — CREATININE, SERUM
Creatinine, Ser: 1.19 mg/dL (ref 0.61–1.24)
GFR, Estimated: >60 mL/min (ref >60)

## 2021-11-20 LAB — GLUCOSE, CAPILLARY
Glucose-Capillary: 159 mg/dL — ABNORMAL HIGH (ref 70–99)
Glucose-Capillary: 164 mg/dL — ABNORMAL HIGH (ref 70–99)

## 2021-11-20 SURGERY — LOWER EXTREMITY ANGIOGRAPHY
Anesthesia: Moderate Sedation | Site: Leg Lower | Laterality: Right

## 2021-11-20 MED ORDER — CLOPIDOGREL BISULFATE 75 MG PO TABS: 75.0000 mg | ORAL_TABLET | Freq: Every day | ORAL | Status: DC

## 2021-11-20 MED ORDER — SODIUM CHLORIDE 0.9 % IV BOLUS
250.0000 mL | Freq: Once | INTRAVENOUS | Status: AC
  Administered 2021-11-20: 250 mL via INTRAVENOUS

## 2021-11-20 MED ORDER — HEPARIN SODIUM (PORCINE) 1000 UNIT/ML IJ SOLN
INTRAMUSCULAR | Status: AC
  Filled 2021-11-20: qty 10

## 2021-11-20 MED ORDER — MIDAZOLAM HCL 2 MG/2ML IJ SOLN
INTRAMUSCULAR | Status: DC | PRN
  Administered 2021-11-20: 1 mg via INTRAVENOUS

## 2021-11-20 MED ORDER — ASPIRIN EC 81 MG PO TBEC
DELAYED_RELEASE_TABLET | ORAL | Status: AC
  Administered 2021-11-20: 81 mg via ORAL
  Filled 2021-11-20: qty 1

## 2021-11-20 MED ORDER — SODIUM CHLORIDE 0.9 % IV SOLN: 250.0000 mL | INTRAVENOUS | Status: DC | PRN

## 2021-11-20 MED ORDER — SODIUM CHLORIDE 0.9% FLUSH: 3.0000 mL | Freq: Two times a day (BID) | INTRAVENOUS | Status: DC

## 2021-11-20 MED ORDER — SODIUM CHLORIDE 0.9 % IV SOLN: INTRAVENOUS | Status: DC

## 2021-11-20 MED ORDER — HYDROMORPHONE HCL 1 MG/ML IJ SOLN: 1.0000 mg | Freq: Once | INTRAMUSCULAR | Status: DC | PRN

## 2021-11-20 MED ORDER — MIDAZOLAM HCL 5 MG/5ML IJ SOLN
INTRAMUSCULAR | Status: AC
  Filled 2021-11-20: qty 5

## 2021-11-20 MED ORDER — ASPIRIN EC 81 MG PO TBEC: 81.0000 mg | DELAYED_RELEASE_TABLET | Freq: Every day | ORAL | Status: DC

## 2021-11-20 MED ORDER — ONDANSETRON HCL 4 MG/2ML IJ SOLN: 4.0000 mg | Freq: Four times a day (QID) | INTRAMUSCULAR | Status: DC | PRN

## 2021-11-20 MED ORDER — FENTANYL CITRATE (PF) 100 MCG/2ML IJ SOLN
INTRAMUSCULAR | Status: DC | PRN
  Administered 2021-11-20: 50 ug via INTRAVENOUS

## 2021-11-20 MED ORDER — LABETALOL HCL 5 MG/ML IV SOLN: 10.0000 mg | INTRAVENOUS | Status: DC | PRN

## 2021-11-20 MED ORDER — HYDRALAZINE HCL 20 MG/ML IJ SOLN: 5.0000 mg | INTRAMUSCULAR | Status: DC | PRN

## 2021-11-20 MED ORDER — ACETAMINOPHEN 325 MG PO TABS: 650.0000 mg | ORAL_TABLET | ORAL | Status: DC | PRN

## 2021-11-20 MED ORDER — ASPIRIN EC 81 MG PO TBEC: 81.0000 mg | DELAYED_RELEASE_TABLET | Freq: Every day | ORAL | 2 refills | Status: DC

## 2021-11-20 MED ORDER — FENTANYL CITRATE PF 50 MCG/ML IJ SOSY
PREFILLED_SYRINGE | INTRAMUSCULAR | Status: AC
  Filled 2021-11-20: qty 2

## 2021-11-20 MED ORDER — CEFAZOLIN SODIUM-DEXTROSE 1-4 GM/50ML-% IV SOLN
INTRAVENOUS | Status: DC | PRN
  Administered 2021-11-20: 2 g via INTRAVENOUS

## 2021-11-20 MED ORDER — HEPARIN SODIUM (PORCINE) 1000 UNIT/ML IJ SOLN
INTRAMUSCULAR | Status: DC | PRN
  Administered 2021-11-20: 5000 [IU] via INTRAVENOUS

## 2021-11-20 MED ORDER — SODIUM CHLORIDE 0.9% FLUSH: 3.0000 mL | INTRAVENOUS | Status: DC | PRN

## 2021-11-20 MED ORDER — CLOPIDOGREL BISULFATE 75 MG PO TABS: 75.0000 mg | ORAL_TABLET | Freq: Every day | ORAL | 11 refills | Status: DC

## 2021-11-20 MED ORDER — IODIXANOL 320 MG/ML IV SOLN
INTRAVENOUS | Status: DC | PRN
Start: 2021-11-20 — End: 2021-11-20
  Administered 2021-11-20: 50 mL via INTRA_ARTERIAL

## 2021-11-20 SURGICAL SUPPLY — 22 items
BALLN LUTONIX 018 5X150X130 (BALLOONS) ×2
BALLN LUTONIX 018 5X60X130 (BALLOONS) ×2
BALLN ULTRVRSE 3X150X150 (BALLOONS) ×2
BALLOON LUTONIX 018 5X150X130 (BALLOONS) IMPLANT
BALLOON LUTONIX 018 5X60X130 (BALLOONS) IMPLANT
BALLOON ULTRVRSE 3X150X150 (BALLOONS) IMPLANT
CATH ANGIO 5F PIGTAIL 65CM (CATHETERS) ×1 IMPLANT
CATH BEACON 5 .038 100 VERT TP (CATHETERS) ×1 IMPLANT
CATH ROTAREX 135 6FR (CATHETERS) ×1 IMPLANT
COVER PROBE U/S 5X48 (MISCELLANEOUS) ×1 IMPLANT
DEVICE STARCLOSE SE CLOSURE (Vascular Products) ×1 IMPLANT
GAUZE SPONGE 4X4 12PLY STRL (GAUZE/BANDAGES/DRESSINGS) ×3 IMPLANT
GLIDEWIRE ADV .035X260CM (WIRE) ×1 IMPLANT
KIT ENCORE 26 ADVANTAGE (KITS) ×1 IMPLANT
PACK ANGIOGRAPHY (CUSTOM PROCEDURE TRAY) ×2 IMPLANT
SHEATH ANL2 6FRX45 HC (SHEATH) ×1 IMPLANT
SHEATH BRITE TIP 5FRX11 (SHEATH) ×1 IMPLANT
STENT VIABAHN 6X50X120 (Permanent Stent) ×1 IMPLANT
SYR MEDRAD MARK 7 150ML (SYRINGE) ×1 IMPLANT
TUBING CONTRAST HIGH PRESS 72 (TUBING) ×1 IMPLANT
WIRE G V18X300CM (WIRE) ×1 IMPLANT
WIRE GUIDERIGHT .035X150 (WIRE) ×1 IMPLANT

## 2021-11-20 NOTE — Telephone Encounter (Signed)
Rosey Bath From [facility] called in was wondering if Dr. [name] will sign future orders for nursing... Rosey Bath stated that the orders are for nursing, assessment and education, and 3 prn....  ? ?Order:  ?1W9 nursing  ?1W3 assessment and education  ?3 prn  ? ?Rosey Bath stated that if he have any issues please call her back at [phone] ?

## 2021-11-20 NOTE — Telephone Encounter (Signed)
Yes I will sign these

## 2021-11-20 NOTE — Interval H&P Note (Signed)
History and Physical Interval Note: ? ?11/20/2021 ?7:58 AM ? ?Danny Friends Rabel Sr.  has presented today for surgery, with the diagnosis of RLE Angio    ASO w ulceration.  The various methods of treatment have been discussed with the patient and family. After consideration of risks, benefits and other options for treatment, the patient has consented to  Procedure(s): ?Lower Extremity Angiography (Right) as a surgical intervention.  The patient's history has been reviewed, patient examined, no change in status, stable for surgery.  I have reviewed the patient's chart and labs.  Questions were answered to the patient's satisfaction.   ? ? ?Festus Barren ? ? ?

## 2021-11-20 NOTE — Telephone Encounter (Signed)
pt needs a lift sent to [facility] , they will reach out to the patient to schedule.  ?

## 2021-11-20 NOTE — Telephone Encounter (Signed)
I called and spoke with teresa and informed him that the provider stated he would sign  the future orders and she understood.  Aishani Kalis,cma  ?

## 2021-11-20 NOTE — OR Nursing (Signed)
When pt discharged reviewed discharge instructions at curb side, Pt family informed us they were told to hold blood thinners since he recently had a head bleed. This was not shared with [place] by patient preprocedure. Dr dew notified. Family called to notify prime MD that patient had stent today and needs to be on Plavix and aspirin to prevent clotting of the stent that was placed today.  ?

## 2021-11-20 NOTE — Op Note (Signed)
[facility] ? Percutaneous Study/Intervention Procedural Note ? ? ?Date of Surgery: 11/20/2021 ? ?Surgeon(s):Makenzi Bannister   ? ?Assistants:none ? ?Pre-operative Diagnosis: PAD with ulceration RLE ? ?Post-operative diagnosis:  Same ? ?Procedure(s) Performed: ?            1.  Ultrasound guidance for vascular access left femoral artery ?            2.  Catheter placement into right SFA from left femoral approach ?            3.  Aortogram and selective right lower extremity angiogram ?            4.  Percutaneous transluminal angioplasty of right peroneal artery with 3 mm diameter by 15 cm length angioplasty balloon ?            5.  Mechanical thrombectomy to the right SFA and popliteal artery to debulk chronic thrombus from the occlusion with the [place] Rex device ? 6.  Percutaneous transluminal angioplasty of the right distal SFA and above-knee popliteal artery with 5 mm diameter by 15 cm length Lutonix drug-coated angioplasty balloon ? 7.  Viabahn stent placement to the right popliteal artery with 6 mm diameter by 5 cm length Viabahn stent ?            8.  StarClose closure device left femoral artery ? ?EBL: 50 cc ? ?Contrast: 50 cc ? ?Fluoro Time: 4.5 minutes ? ?Moderate Conscious Sedation Time: approximately 43 minutes using 1 mg of Versed and 50 mcg of Fentanyl ?             ?Indications:  Patient is a 71 y.o.male with PAD and a non-healing ulceration on the right foot. The patient is brought in for angiography for further evaluation and potential treatment.  Due to the limb threatening nature of the situation, angiogram was performed for attempted limb salvage. The patient is aware that if the procedure fails, amputation would be expected.  The patient also understands that even with successful revascularization, amputation may still be required due to the severity of the situation.  Risks and benefits are discussed and informed consent is obtained.  ? ?Procedure:  The patient was identified  and appropriate procedural time out was performed.  The patient was then placed supine on the table and prepped and draped in the usual sterile fashion. Moderate conscious sedation was administered during a face to face encounter with the patient throughout the procedure with my supervision of the RN administering medicines and monitoring the patient's vital signs, pulse oximetry, telemetry and mental status throughout from the start of the procedure until the patient was taken to the recovery room. Ultrasound was used to evaluate the left common femoral artery.  It was patent .  A digital ultrasound image was acquired.  A Seldinger needle was used to access the left common femoral artery under direct ultrasound guidance and a permanent image was performed.  A 0.035 J wire was advanced without resistance and a 5Fr sheath was placed.  Pigtail catheter was placed into the aorta and an AP aortogram was performed. This demonstrated normal renal arteries and normal aorta and iliac segments without significant stenosis. I then crossed the aortic bifurcation and advanced to the right femoral head and then into the right SFA because of his slow circulation time to opacify distally. Selective right lower extremity angiogram was then performed. This demonstrated mild narrowing of the common femoral artery of less than 50%.  Fairly normal profunda femoris artery and proximal to mid superficial femoral artery.  There is occlusion of the distal superficial femoral artery with reconstitution of the mid popliteal artery about 8 to 10 cm in length.  The below-knee popliteal artery normalized and then there was one-vessel runoff distally with moderate stenosis in the peroneal artery in the midsegment in the 65 to 70% range and this was the only runoff distally. It was felt that it was in the patient's best interest to proceed with intervention after these images to avoid a second procedure and a larger amount of contrast and  fluoroscopy based off of the findings from the initial angiogram. The patient was systemically heparinized and a 6 [place] Ansell sheath was then placed over the [employer] wire. I then used a Kumpe catheter and the advantage wire to cross the SFA and popliteal occlusion without difficulty and confirm intraluminal flow in the below-knee popliteal artery.  I then exchanged for a V18 wire and cross the stenosis in the peroneal artery without difficulty.  Mechanical thrombectomy was then performed of the right SFA and above-knee popliteal artery to debulk the chronic thrombus from the occlusion.  This was done with 2 passes with the Green[place] Rex device and resulted in a channel of blood flow with significant narrowing residual distal SFA and above-knee popliteal artery.  I proceeded with angioplasty of the peroneal artery with a 3 mm diameter by 15 cm length angioplasty balloon inflated to 8 atm for 1 minute with less than 20% residual stenosis after angioplasty.  I proceeded with angioplasty of the distal SFA and above-knee popliteal artery with a 5 mm diameter by 15 cm length Lutonix drug-coated angioplasty balloon inflated to 8 atm for 1 minute.  Completion imaging showed the distal SFA to have less than 20% residual stenosis, but the distal reentry point in the above-knee popliteal artery still had a greater than 50% residual stenosis.  I treated this area with a 6 mm diameter by 5 cm length Viabahn stent postdilated with a 5 mm balloon with excellent angiographic completion result and less than 10% residual stenosis. I elected to terminate the procedure. The sheath was removed and StarClose closure device was deployed in the left femoral artery with excellent hemostatic result. The patient was taken to the recovery room in stable condition having tolerated the procedure well. ? ?Findings:   ?            Aortogram:  This demonstrated normal renal arteries and normal aorta and iliac segments without significant  stenosis. ?            Right Lower Extremity:  This demonstrated mild narrowing of the common femoral artery of less than 50%.  Fairly normal profunda femoris artery and proximal to mid superficial femoral artery.  There is occlusion of the distal superficial femoral artery with reconstitution of the mid popliteal artery about 8 to 10 cm in length.  The below-knee popliteal artery normalized and then there was one-vessel runoff distally with moderate stenosis in the peroneal artery in the midsegment in the 65 to 70% range and this was the only runoff distally. ? ? ?Disposition: Patient was taken to the recovery room in stable condition having tolerated the procedure well. ? ?Complications: None ? ?Danny [name] ?11/20/2021 ?9:09 AM ? ? ?This note was created with Dragon Medical transcription system. Any errors in dictation are purely unintentional.  ?

## 2021-11-20 NOTE — Telephone Encounter (Signed)
I called and the call could not be completed at this time.  Caine Barfield,cma  ?

## 2021-11-21 DIAGNOSIS — G5791 Unspecified mononeuropathy of right lower limb: Secondary | ICD-10-CM | POA: Diagnosis not present

## 2021-11-21 DIAGNOSIS — I615 Nontraumatic intracerebral hemorrhage, intraventricular: Secondary | ICD-10-CM | POA: Diagnosis not present

## 2021-11-21 DIAGNOSIS — I69991 Dysphagia following unspecified cerebrovascular disease: Secondary | ICD-10-CM | POA: Diagnosis not present

## 2021-11-22 DIAGNOSIS — I615 Nontraumatic intracerebral hemorrhage, intraventricular: Secondary | ICD-10-CM | POA: Diagnosis not present

## 2021-11-22 DIAGNOSIS — I69991 Dysphagia following unspecified cerebrovascular disease: Secondary | ICD-10-CM | POA: Diagnosis not present

## 2021-11-22 DIAGNOSIS — G5791 Unspecified mononeuropathy of right lower limb: Secondary | ICD-10-CM | POA: Diagnosis not present

## 2021-11-22 NOTE — Telephone Encounter (Signed)
I called the patient and informed him that he needed a follow up with the provider, he stated he is in rehab and just had surgery yesterday, I informed him to call back and schedule when he can and he understood. ?

## 2021-11-23 ENCOUNTER — Telehealth: Payer: Self-pay | Admitting: Family Medicine

## 2021-11-23 NOTE — Telephone Encounter (Signed)
Pt son Onalee Hua called in stating that someone called pt to advise him that he needed to callback and schedule an appt... Onalee Hua is not on DPR, Spoke with Pt to get permission to talk with son about pt records... Pt gave verbal permission over the phone... Advised pt son at next appointment to request DPR form to update DPR... Pt son stated that pt is suppose to be schedule for an MRI and the facility advised him that they are a no lift facility... Pt son stated that the facility advised him that PT needs to go to the hospital to get the MRI done... Pt requesting callback...  ?

## 2021-11-24 DIAGNOSIS — I615 Nontraumatic intracerebral hemorrhage, intraventricular: Secondary | ICD-10-CM | POA: Diagnosis not present

## 2021-11-24 DIAGNOSIS — G5791 Unspecified mononeuropathy of right lower limb: Secondary | ICD-10-CM | POA: Diagnosis not present

## 2021-11-24 DIAGNOSIS — I69991 Dysphagia following unspecified cerebrovascular disease: Secondary | ICD-10-CM | POA: Diagnosis not present

## 2021-11-24 NOTE — Telephone Encounter (Signed)
I called and VM is not set up.  ng ?

## 2021-11-24 NOTE — Telephone Encounter (Signed)
It looks like this was ordered by his neurologist.  They should contact the neurology office to help reschedule this for the appropriate facility. ?

## 2021-11-27 ENCOUNTER — Telehealth: Payer: Self-pay | Admitting: Family Medicine

## 2021-11-27 NOTE — Telephone Encounter (Signed)
Danny [name] from [facility] called about update of verbal orders for OT and PT.  ?

## 2021-11-28 NOTE — Telephone Encounter (Signed)
I called the patient and he stated he has a MRI scheduled at the hospital in [place] through his Neurologist. .  Katniss Weedman,cma  ?

## 2021-11-29 NOTE — Telephone Encounter (Signed)
I believe I signed paperwork yesterday on this. Please see what was faxed.  ?

## 2021-11-30 DIAGNOSIS — Z7902 Long term (current) use of antithrombotics/antiplatelets: Secondary | ICD-10-CM | POA: Diagnosis not present

## 2021-11-30 DIAGNOSIS — Z89512 Acquired absence of left leg below knee: Secondary | ICD-10-CM | POA: Diagnosis not present

## 2021-11-30 DIAGNOSIS — J449 Chronic obstructive pulmonary disease, unspecified: Secondary | ICD-10-CM | POA: Diagnosis not present

## 2021-11-30 DIAGNOSIS — M6281 Muscle weakness (generalized): Secondary | ICD-10-CM | POA: Diagnosis not present

## 2021-11-30 DIAGNOSIS — I252 Old myocardial infarction: Secondary | ICD-10-CM | POA: Diagnosis not present

## 2021-11-30 DIAGNOSIS — R0789 Other chest pain: Secondary | ICD-10-CM | POA: Diagnosis not present

## 2021-11-30 DIAGNOSIS — I10 Essential (primary) hypertension: Secondary | ICD-10-CM | POA: Diagnosis not present

## 2021-11-30 DIAGNOSIS — Z79899 Other long term (current) drug therapy: Secondary | ICD-10-CM | POA: Diagnosis not present

## 2021-11-30 DIAGNOSIS — I2510 Atherosclerotic heart disease of native coronary artery without angina pectoris: Secondary | ICD-10-CM | POA: Diagnosis not present

## 2021-11-30 DIAGNOSIS — J439 Emphysema, unspecified: Secondary | ICD-10-CM | POA: Diagnosis not present

## 2021-11-30 DIAGNOSIS — G5791 Unspecified mononeuropathy of right lower limb: Secondary | ICD-10-CM | POA: Diagnosis not present

## 2021-11-30 DIAGNOSIS — F17211 Nicotine dependence, cigarettes, in remission: Secondary | ICD-10-CM | POA: Diagnosis not present

## 2021-11-30 DIAGNOSIS — R6889 Other general symptoms and signs: Secondary | ICD-10-CM | POA: Diagnosis not present

## 2021-11-30 DIAGNOSIS — I69991 Dysphagia following unspecified cerebrovascular disease: Secondary | ICD-10-CM | POA: Diagnosis not present

## 2021-11-30 DIAGNOSIS — Z743 Need for continuous supervision: Secondary | ICD-10-CM | POA: Diagnosis not present

## 2021-11-30 DIAGNOSIS — Z8673 Personal history of transient ischemic attack (TIA), and cerebral infarction without residual deficits: Secondary | ICD-10-CM | POA: Diagnosis not present

## 2021-11-30 DIAGNOSIS — Z7984 Long term (current) use of oral hypoglycemic drugs: Secondary | ICD-10-CM | POA: Diagnosis not present

## 2021-11-30 DIAGNOSIS — Z20822 Contact with and (suspected) exposure to covid-19: Secondary | ICD-10-CM | POA: Diagnosis not present

## 2021-11-30 DIAGNOSIS — I615 Nontraumatic intracerebral hemorrhage, intraventricular: Secondary | ICD-10-CM | POA: Diagnosis not present

## 2021-11-30 DIAGNOSIS — E119 Type 2 diabetes mellitus without complications: Secondary | ICD-10-CM | POA: Diagnosis not present

## 2021-11-30 DIAGNOSIS — L89309 Pressure ulcer of unspecified buttock, unspecified stage: Secondary | ICD-10-CM | POA: Diagnosis not present

## 2021-11-30 DIAGNOSIS — F32A Depression, unspecified: Secondary | ICD-10-CM | POA: Diagnosis not present

## 2021-11-30 DIAGNOSIS — Z95828 Presence of other vascular implants and grafts: Secondary | ICD-10-CM | POA: Diagnosis not present

## 2021-11-30 DIAGNOSIS — R519 Headache, unspecified: Secondary | ICD-10-CM | POA: Diagnosis not present

## 2021-11-30 DIAGNOSIS — R079 Chest pain, unspecified: Secondary | ICD-10-CM | POA: Diagnosis not present

## 2021-12-01 DIAGNOSIS — R9439 Abnormal result of other cardiovascular function study: Secondary | ICD-10-CM | POA: Diagnosis not present

## 2021-12-01 DIAGNOSIS — R079 Chest pain, unspecified: Secondary | ICD-10-CM | POA: Diagnosis not present

## 2021-12-03 ENCOUNTER — Ambulatory Visit (HOSPITAL_COMMUNITY): Payer: Medicare Other

## 2021-12-05 DIAGNOSIS — M6281 Muscle weakness (generalized): Secondary | ICD-10-CM | POA: Diagnosis not present

## 2021-12-05 DIAGNOSIS — I615 Nontraumatic intracerebral hemorrhage, intraventricular: Secondary | ICD-10-CM | POA: Diagnosis not present

## 2021-12-05 DIAGNOSIS — I69991 Dysphagia following unspecified cerebrovascular disease: Secondary | ICD-10-CM | POA: Diagnosis not present

## 2021-12-05 DIAGNOSIS — G5791 Unspecified mononeuropathy of right lower limb: Secondary | ICD-10-CM | POA: Diagnosis not present

## 2021-12-06 ENCOUNTER — Encounter: Payer: Self-pay | Admitting: Neurology

## 2021-12-06 ENCOUNTER — Telehealth: Payer: Self-pay | Admitting: Family Medicine

## 2021-12-06 NOTE — Telephone Encounter (Signed)
Called the patient and there was no answer. VM box is not set up. Unable to LVM. I will send a mychart message as pt appears to be active ?

## 2021-12-06 NOTE — Telephone Encounter (Signed)
Can you guys check to see that Danny [name] resumed Plavix and asa 81mg  following vascular stent 4/17. We had held in setting of hemorraghic stroke. If blood pressures are well controlled and no significant worsening in headaches or new stroke symptoms, he should be ok to resume Plavix and asa. Stroke was 3 months ago. He has MRI scheduled 5/15. ?

## 2021-12-07 NOTE — Telephone Encounter (Addendum)
Called pt at [phone]. Nephew, David picked up but not on DPR. Unable to speak with him or pt.  ?I called son on DPR. He is currently in rehab, unsure if he is taking ASA, Plavix. He will be seeing them this am, they are coming to visit him. He will discuss w/ Onalee Hua since he does his medication management and will call us back with an update. ?

## 2021-12-08 ENCOUNTER — Telehealth: Payer: Self-pay

## 2021-12-08 DIAGNOSIS — Z7985 Long-term (current) use of injectable non-insulin antidiabetic drugs: Secondary | ICD-10-CM | POA: Diagnosis not present

## 2021-12-08 DIAGNOSIS — Z89512 Acquired absence of left leg below knee: Secondary | ICD-10-CM | POA: Diagnosis not present

## 2021-12-08 DIAGNOSIS — Z8673 Personal history of transient ischemic attack (TIA), and cerebral infarction without residual deficits: Secondary | ICD-10-CM | POA: Diagnosis not present

## 2021-12-08 DIAGNOSIS — Z794 Long term (current) use of insulin: Secondary | ICD-10-CM | POA: Diagnosis not present

## 2021-12-08 DIAGNOSIS — I5042 Chronic combined systolic (congestive) and diastolic (congestive) heart failure: Secondary | ICD-10-CM | POA: Diagnosis not present

## 2021-12-08 DIAGNOSIS — I130 Hypertensive heart and chronic kidney disease with heart failure and stage 1 through stage 4 chronic kidney disease, or unspecified chronic kidney disease: Secondary | ICD-10-CM | POA: Diagnosis not present

## 2021-12-08 DIAGNOSIS — Z7984 Long term (current) use of oral hypoglycemic drugs: Secondary | ICD-10-CM | POA: Diagnosis not present

## 2021-12-08 DIAGNOSIS — E1122 Type 2 diabetes mellitus with diabetic chronic kidney disease: Secondary | ICD-10-CM | POA: Diagnosis not present

## 2021-12-08 DIAGNOSIS — Z87891 Personal history of nicotine dependence: Secondary | ICD-10-CM | POA: Diagnosis not present

## 2021-12-08 DIAGNOSIS — E1142 Type 2 diabetes mellitus with diabetic polyneuropathy: Secondary | ICD-10-CM | POA: Diagnosis not present

## 2021-12-08 DIAGNOSIS — J439 Emphysema, unspecified: Secondary | ICD-10-CM | POA: Diagnosis not present

## 2021-12-08 DIAGNOSIS — E1165 Type 2 diabetes mellitus with hyperglycemia: Secondary | ICD-10-CM | POA: Diagnosis not present

## 2021-12-08 DIAGNOSIS — Z9181 History of falling: Secondary | ICD-10-CM | POA: Diagnosis not present

## 2021-12-08 DIAGNOSIS — N183 Chronic kidney disease, stage 3 unspecified: Secondary | ICD-10-CM | POA: Diagnosis not present

## 2021-12-08 DIAGNOSIS — E785 Hyperlipidemia, unspecified: Secondary | ICD-10-CM | POA: Diagnosis not present

## 2021-12-08 DIAGNOSIS — I2510 Atherosclerotic heart disease of native coronary artery without angina pectoris: Secondary | ICD-10-CM | POA: Diagnosis not present

## 2021-12-08 DIAGNOSIS — F32A Depression, unspecified: Secondary | ICD-10-CM | POA: Diagnosis not present

## 2021-12-08 NOTE — Telephone Encounter (Signed)
Patient's nephew, Danny [name], called to say patient asked him to call us.  Danny [name] states patient is staying with him until patient's son gets out of the hospital.  Danny [name] states patient said he received a call from [place] telling patient to discontinue a medication and he wasn't sure which medication he needs to stop taking. ? ?Danny [name] also states patient is urinating on himself a lot.  He said patient can't hold the urine long enough to get a urinal on him.  Danny [name] states the patient is also dizzy.  Danny [name] said we can call the patient at [phone]. ?

## 2021-12-08 NOTE — Telephone Encounter (Signed)
Beverely Pace called in stating that the nurse is at pt home... Shanon Brow stated that the nurse check pt BP and the BP came back 85/56... Shanon Brow stated that the nurse advised him that the pt heart rate is 96... Spoke with Odis Hollingshead stated Dr. [name] request that pt been seen by someone today... Called back Pt nephew and advised pt nephew that Dr. [name] requested for pt to be seen by someone today... Pt nephew understood...  ?

## 2021-12-11 ENCOUNTER — Ambulatory Visit: Payer: 59

## 2021-12-12 ENCOUNTER — Ambulatory Visit (INDEPENDENT_AMBULATORY_CARE_PROVIDER_SITE_OTHER): Payer: Medicare Other

## 2021-12-12 VITALS — Ht 72.0 in | Wt 226.0 lb

## 2021-12-12 DIAGNOSIS — J439 Emphysema, unspecified: Secondary | ICD-10-CM | POA: Diagnosis not present

## 2021-12-12 DIAGNOSIS — R000 Tachycardia, unspecified: Secondary | ICD-10-CM | POA: Diagnosis not present

## 2021-12-12 DIAGNOSIS — R404 Transient alteration of awareness: Secondary | ICD-10-CM | POA: Diagnosis not present

## 2021-12-12 DIAGNOSIS — R55 Syncope and collapse: Secondary | ICD-10-CM | POA: Diagnosis not present

## 2021-12-12 DIAGNOSIS — Z0000 Encounter for general adult medical examination without abnormal findings: Secondary | ICD-10-CM

## 2021-12-12 DIAGNOSIS — Z743 Need for continuous supervision: Secondary | ICD-10-CM | POA: Diagnosis not present

## 2021-12-12 DIAGNOSIS — Z794 Long term (current) use of insulin: Secondary | ICD-10-CM | POA: Diagnosis not present

## 2021-12-12 DIAGNOSIS — I252 Old myocardial infarction: Secondary | ICD-10-CM | POA: Diagnosis not present

## 2021-12-12 DIAGNOSIS — I10 Essential (primary) hypertension: Secondary | ICD-10-CM | POA: Diagnosis not present

## 2021-12-12 DIAGNOSIS — R4781 Slurred speech: Secondary | ICD-10-CM | POA: Diagnosis not present

## 2021-12-12 DIAGNOSIS — E119 Type 2 diabetes mellitus without complications: Secondary | ICD-10-CM | POA: Diagnosis not present

## 2021-12-12 NOTE — Patient Instructions (Addendum)
Danny [name] , Thank you for taking time to come for your Medicare Wellness Visit. I appreciate your ongoing commitment to your health goals. Please review the following plan we discussed and let me know if I can assist you in the future.   These are the goals we discussed:  Goals      Follow up with Primary Care Provider     Keep all routine visits as scheduled         This is a list of the screening recommended for you and due dates:  Health Maintenance  Topic Date Due   Complete foot exam   12/13/2021*   Eye exam for diabetics  02/03/2022*   Zoster (Shingles) Vaccine (1 of 2) 03/14/2022*   COVID-19 Vaccine (3 - Booster for Moderna series) 09/06/2022*   Pneumonia Vaccine (2 - PCV) 12/13/2022*   Colon Cancer Screening  12/13/2022*   Flu Shot  03/06/2022   Hemoglobin A1C  04/02/2022   Tetanus Vaccine  11/29/2030   Hepatitis C Screening: USPSTF Recommendation to screen - Ages 18-79 yo.  Completed   HPV Vaccine  Aged Out  *Topic was postponed. The date shown is not the original due date.   Opioid Pain Medicine Management Opioids are powerful medicines that are used to treat moderate to severe pain. When used for short periods of time, they can help you to: Sleep better. Do better in physical or occupational therapy. Feel better in the first few days after an injury. Recover from surgery. Opioids should be taken with the supervision of a trained health care provider. They should be taken for the shortest period of time possible. This is because opioids can be addictive, and the longer you take opioids, the greater your risk of addiction. This addiction can also be called opioid use disorder. What are the risks? Using opioid pain medicines for longer than 3 days increases your risk of side effects. Side effects include: Constipation. Nausea and vomiting. Breathing difficulties (respiratory depression). Drowsiness. Confusion. Opioid use disorder. Itching. Taking opioid pain  medicine for a long period of time can affect your ability to do daily tasks. It also puts you at risk for: Motor vehicle crashes. Depression. Suicide. Heart attack. Overdose, which can be life-threatening. What is a pain treatment plan? A pain treatment plan is an agreement between you and your health care provider. Pain is unique to each person, and treatments vary depending on your condition. To manage your pain, you and your health care provider need to work together. To help you do this: Discuss the goals of your treatment, including how much pain you might expect to have and how you will manage the pain. Review the risks and benefits of taking opioid medicines. Remember that a good treatment plan uses more than one approach and minimizes the chance of side effects. Be honest about the amount of medicines you take and about any drug or alcohol use. Get pain medicine prescriptions from only one health care provider. Pain can be managed with many types of alternative treatments. Ask your health care provider to refer you to one or more specialists who can help you manage pain through: Physical or occupational therapy. Counseling (cognitive behavioral therapy). Good nutrition. Biofeedback. Massage. Meditation. Non-opioid medicine. Following a gentle exercise program. How to use opioid pain medicine Taking medicine Take your pain medicine exactly as told by your health care provider. Take it only when you need it. If your pain gets less severe, you may take less than  your prescribed dose if your health care provider approves. If you are not having pain, do nottake pain medicine unless your health care provider tells you to take it. If your pain is severe, do nottry to treat it yourself by taking more pills than instructed on your prescription. Contact your health care provider for help. Write down the times when you take your pain medicine. It is easy to become confused while on pain  medicine. Writing the time can help you avoid overdose. Take other over-the-counter or prescription medicines only as told by your health care provider. Keeping yourself and others safe  While you are taking opioid pain medicine: Do not drive, use machinery, or power tools. Do not sign legal documents. Do not drink alcohol. Do not take sleeping pills. Do not supervise children by yourself. Do not do activities that require climbing or being in high places. Do not go to a lake, river, ocean, spa, or swimming pool. Do not share your pain medicine with anyone. Keep pain medicine in a locked cabinet or in a secure area where pets and children cannot reach it. Stopping your use of opioids If you have been taking opioid medicine for more than a few weeks, you may need to slowly decrease (taper) how much you take until you stop completely. Tapering your use of opioids can decrease your risk of symptoms of withdrawal, such as: Pain and cramping in the abdomen. Nausea. Sweating. Sleepiness. Restlessness. Uncontrollable shaking (tremors). Cravings for the medicine. Do not attempt to taper your use of opioids on your own. Talk with your health care provider about how to do this. Your health care provider may prescribe a step-down schedule based on how much medicine you are taking and how long you have been taking it. Getting rid of leftover pills Do not save any leftover pills. Get rid of leftover pills safely by: Taking the medicine to a prescription take-back program. This is usually offered by the county or law enforcement. Bringing them to a pharmacy that has a drug disposal container. Flushing them down the toilet. Check the label or package insert of your medicine to see whether this is safe to do. Throwing them out in the trash. Check the label or package insert of your medicine to see whether this is safe to do. If it is safe to throw it out, remove the medicine from the original  container, put it into a sealable bag or container, and mix it with used coffee grounds, food scraps, dirt, or cat litter before putting it in the trash. Follow these instructions at home: Activity Do exercises as told by your health care provider. Avoid activities that make your pain worse. Return to your normal activities as told by your health care provider. Ask your health care provider what activities are safe for you. General instructions You may need to take these actions to prevent or treat constipation: Drink enough fluid to keep your urine pale yellow. Take over-the-counter or prescription medicines. Eat foods that are high in fiber, such as beans, whole grains, and fresh fruits and vegetables. Limit foods that are high in fat and processed sugars, such as fried or sweet foods. Keep all follow-up visits. This is important. Where to find support If you have been taking opioids for a long time, you may benefit from receiving support for quitting from a local support group or counselor. Ask your health care provider for a referral to these resources in your area. Where to find more information  Centers for Disease Control and Prevention (CDC): FootballExhibition.com.br [place] Food and Drug Administration (FDA): PumpkinSearch.com.ee Get help right away if: You may have taken too much of an opioid (overdosed). Common symptoms of an overdose: Your breathing is slower or more shallow than normal. You have a very slow heartbeat (pulse). You have slurred speech. You have nausea and vomiting. Your pupils become very small. You have other potential symptoms: You are very confused. You faint or feel like you will faint. You have cold, clammy skin. You have blue lips or fingernails. You have thoughts of harming yourself or harming others. These symptoms may represent a serious problem that is an emergency. Do not wait to see if the symptoms will go away. Get medical help right away. Call your local emergency  services (911 in the [place] Do not drive yourself to the hospital.  If you ever feel like you may hurt yourself or others, or have thoughts about taking your own life, get help right away. Go to your nearest emergency department or: Call your local emergency services (911 in the [place] Call the [facility] (9517935119 in the [place] Call a suicide crisis helpline, such as the National Suicide Prevention Lifeline at 402-722-1509 or 988 in the [place] This is open 24 hours a day in the [place] Text the Crisis Text Line at 438-406-5820 (in the [place] Summary Opioid medicines can help you manage moderate to severe pain for a short period of time. A pain treatment plan is an agreement between you and your health care provider. Discuss the goals of your treatment, including how much pain you might expect to have and how you will manage the pain. If you think that you or someone else may have taken too much of an opioid, get medical help right away. This information is not intended to replace advice given to you by your health care provider. Make sure you discuss any questions you have with your health care provider. Document Revised: 02/15/2021 Document Reviewed: 11/02/2020 Elsevier Patient Education  2023 [employer].

## 2021-12-12 NOTE — Telephone Encounter (Signed)
Beverely Pace called in stating that pt passed out and they are taking him to ER... Beverely Pace stated that pt was feeling sick.Marland KitchenMarland Kitchen  ?

## 2021-12-12 NOTE — Telephone Encounter (Signed)
Noted. The patient needs to be seen in the ED for this. Please call the nephew to make sure EMS took him to the ED for evaluation.  ?

## 2021-12-12 NOTE — Telephone Encounter (Signed)
Danny [name] callback in stated that the EMS had arrived and that they think its a possible stoke cause pt passed out on th EMS... Masayoshi Couzens is requesting callback as soon as possible...  ?

## 2021-12-12 NOTE — Progress Notes (Signed)
Subjective:   Danny Friends Horsfall Sr. is a 71 y.o. male who presents for Medicare Annual/Subsequent preventive examination.  Review of Systems    No ROS.  Medicare Wellness Virtual Visit.  Visual/audio telehealth visit, UTA vital signs.   See social history for additional risk factors.   Cardiac Risk Factors include: advanced age (>38men, >5 women);diabetes mellitus;male gender;hypertension     Objective:    Today's Vitals   12/12/21 1052  Weight: 226 lb (102.5 kg)  Height: 6' (1.829 m)   Body mass index is 30.65 kg/m.     12/12/2021   11:49 AM 10/09/2021   11:00 PM 09/30/2021   10:54 AM 08/15/2021    9:51 PM 12/12/2020    1:42 PM 11/28/2020    4:35 PM 11/28/2020   12:43 PM  Advanced Directives  Does Patient Have a Medical Advance Directive? No No No No No  No  Would patient like information on creating a medical advance directive? No - Patient declined No - Patient declined No - Patient declined No - Patient declined  No - Patient declined     Current Medications (verified) Outpatient Encounter Medications as of 12/12/2021  Medication Sig   acetaminophen (TYLENOL) 325 MG tablet Take 2 tablets (650 mg total) by mouth every 4 (four) hours as needed for mild pain (or temp > 37.5 C (99.5 F)).   albuterol (PROVENTIL) (2.5 MG/3ML) 0.083% nebulizer solution Take 3 mLs (2.5 mg total) by nebulization every 6 (six) hours as needed for wheezing or shortness of breath.   Albuterol Sulfate (PROAIR RESPICLICK) 108 (90 Base) MCG/ACT AEPB Inhale 1 puff into the lungs every 6 (six) hours as needed.   amLODipine (NORVASC) 5 MG tablet Take 1 tablet (5 mg total) by mouth daily.   aspirin EC 81 MG tablet Take 1 tablet (81 mg total) by mouth daily.   atorvastatin (LIPITOR) 80 MG tablet TAKE 1 TABLET BY MOUTH EVERY DAY   butalbital-acetaminophen-caffeine (FIORICET) 50-325-40 MG tablet Take 1 tablet by mouth every 8 (eight) hours as needed for headache.   carvedilol (COREG) 25 MG tablet Take 1 tablet (25  mg total) by mouth 2 (two) times daily with a meal.   clopidogrel (PLAVIX) 75 MG tablet Take 1 tablet (75 mg total) by mouth daily.   ENTRESTO 49-51 MG TAKE 1 TABLET BY MOUTH 2 (TWO) TIMES DAILY. STOP LISINOPRIL AND POTASSIUM   escitalopram (LEXAPRO) 10 MG tablet TAKE 1 TABLET BY MOUTH EVERY DAY   fluticasone-salmeterol (ADVAIR) 250-50 MCG/ACT AEPB Inhale 1 puff into the lungs 2 (two) times daily.   furosemide (LASIX) 20 MG tablet TAKE 1 TABLET (20 MG) BY MOUTH ONCE EVERY OTHER DAY   gabapentin (NEURONTIN) 300 MG capsule Take 1 capsule (300 mg total) by mouth at bedtime.   insulin aspart (NOVOLOG) 100 UNIT/ML injection Inject 0-15 Units into the skin 4 (four) times daily -  before meals and at bedtime. CBG < 70: Implement Hypoglycemia Standing Orders and refer to Hypoglycemia Standing Orders sidebar report CBG 70 - 120: 0 units CBG 121 - 150: 2 units CBG 151 - 200: 3 units CBG 201 - 250: 5 units CBG 251 - 300: 8 units CBG 301 - 350: 11 units CBG 351 - 400: 15 units CBG > 400: call MD   isosorbide mononitrate (IMDUR) 30 MG 24 hr tablet TAKE 1 TABLET BY MOUTH EVERY DAY   JARDIANCE 25 MG TABS tablet TAKE 1 TABLET BY MOUTH DAILY   metFORMIN (GLUCOPHAGE-XR) 500 MG 24  hr tablet Take 1 tablet (500 mg total) by mouth 2 (two) times daily with a meal. (Patient taking differently: Take 500 mg by mouth daily with breakfast. Pt reports "tears my stomach up if I take more than once a day")   nitroGLYCERIN (NITROSTAT) 0.4 MG SL tablet Place 0.4 mg under the tongue every 5 (five) minutes as needed for chest pain.   spironolactone (ALDACTONE) 25 MG tablet Take 1 tablet (25 mg total) by mouth daily.   tamsulosin (FLOMAX) 0.4 MG CAPS capsule Take 1 capsule (0.4 mg total) by mouth daily.   TRULICITY 4.5 MG/0.5ML SOPN INJECT 4.5 MG AS DIRECTED ONCE A WEEK.   No facility-administered encounter medications on file as of 12/12/2021.    Allergies (verified) Metformin and related   History: Past Medical History:   Diagnosis Date   CAD (coronary artery disease)    a. 01/2010 PCI of LAD; b. 09/2014 PCI/DES of mLAD due to ISR. D1 80, D2 80(jailed), LCX 75m; c. 07/2016 NSTEMI/Cath: LM nl, LAD 35m ISR, 40d, D1 90ost, 80p, D2 90ost, RI min irregs, LCX min irregs, OM1 90 small, OM2/3 min irregs, RCA min irregs, RPLB1 90, EF 25-35%.   Chronic combined systolic and diastolic CHF (congestive heart failure) (HCC)    a. 07/2016 Echo: EF 30%, severe septal/anterior HK, Gr1 DD.   CKD (chronic kidney disease), stage III (HCC)    COPD (chronic obstructive pulmonary disease) (HCC)    DM2 (diabetes mellitus, type 2) (HCC)    Erectile dysfunction    HLD (hyperlipidemia)    HTN (hypertension)    Ischemic cardiomyopathy    a. 07/2016 Echo: EF 30% w/ sev septal/ant HK. Gr1 DD.   Past Surgical History:  Procedure Laterality Date   AMPUTATION Left 11/10/2020   Procedure: AMPUTATION BELOW KNEE;  Surgeon: Annice Needy, MD;  Location: [facility] ORS;  Service: General;  Laterality: Left;   BELOW KNEE LEG AMPUTATION     CAD: stent to the LAD     CARDIAC CATHETERIZATION  10/01/2014   CARDIAC CATHETERIZATION N/A 07/23/2016   Procedure: Left Heart Cath and Coronary Angiography;  Surgeon: Iran Ouch, MD;  Location: [facility];  Service: Cardiovascular;  Laterality: N/A;   CORONARY ANGIOPLASTY WITH STENT PLACEMENT  10/01/2014   LOWER EXTREMITY ANGIOGRAPHY Left 10/31/2020   Procedure: LOWER EXTREMITY ANGIOGRAPHY;  Surgeon: Annice Needy, MD;  Location: [facility];  Service: Cardiovascular;  Laterality: Left;   LOWER EXTREMITY ANGIOGRAPHY Right 11/20/2021   Procedure: Lower Extremity Angiography;  Surgeon: Annice Needy, MD;  Location: [facility];  Service: Cardiovascular;  Laterality: Right;   Family History  Problem Relation Age of Onset   Heart attack Father        complications   Cancer Brother    Social History   Socioeconomic History   Marital status: Single    Spouse name: Not on file    Number of children: Not on file   Years of education: Not on file   Highest education level: Not on file  Occupational History   Not on file  Tobacco Use   Smoking status: Former    Packs/day: 3.00    Years: 0.00    Pack years: 0.00    Types: Cigarettes    Quit date: 09/07/2009    Years since quitting: 12.2   Smokeless tobacco: Never   Tobacco comments:    quit june 2011  Vaping Use   Vaping Use: Never used  Substance and Sexual Activity   Alcohol use: Not Currently    Alcohol/week: 7.0 standard drinks    Types: 6 Cans of beer, 1 Standard drinks or equivalent per week    Comment: every other weekend   Drug use: Never   Sexual activity: Not on file  Other Topics Concern   Not on file  Social History Narrative   Not on file   Social Determinants of Health   Financial Resource Strain: Low Risk    Difficulty of Paying Living Expenses: Not hard at all  Food Insecurity: No Food Insecurity   Worried About Programme researcher, broadcasting/film/video in the Last Year: Never true   Ran Out of Food in the Last Year: Never true  Transportation Needs: No Transportation Needs   Lack of Transportation (Medical): No   Lack of Transportation (Non-Medical): No  Physical Activity: Unknown   Days of Exercise per Week: 0 days   Minutes of Exercise per Session: Not on file  Stress: No Stress Concern Present   Feeling of Stress : Only a little  Social Connections: Unknown   Frequency of Communication with Friends and Family: Not on file   Frequency of Social Gatherings with Friends and Family: More than three times a week   Attends Religious Services: Not on file   Active Member of Clubs or Organizations: Not on file   Attends Banker Meetings: Not on file   Marital Status: Not on file    Tobacco Counseling Counseling given: Not Answered Tobacco comments: quit june 2011   Clinical Intake:  Pre-visit preparation completed: Yes        Diabetes: Yes  How often do you need to have  someone help you when you read instructions, pamphlets, or other written materials from your doctor or pharmacy?: 4 - Often    Interpreter Needed?: No      Activities of Daily Living    12/12/2021   10:53 AM 11/13/2021   12:35 PM  In your present state of health, do you have any difficulty performing the following activities:  Hearing? 0 0  Vision? 0 0  Difficulty concentrating or making decisions? 1 0  Walking or climbing stairs? 1 1  Comment Wheelchair Prosthetic leg  Dressing or bathing? 1 0  Comment Family assist   Doing errands, shopping? 1   Comment Family Advice worker and eating ? Y   Comment Family assist with meal prep   Using the Toilet? N   In the past six months, have you accidently leaked urine? Y   Comment Wears daily depend. Incontinence.   Do you have problems with loss of bowel control? Y   Comment Wears daily depend. Incontinence.   Managing your Medications? Y   Comment Family assist   Managing your Finances? Y   Comment Family assist   Housekeeping or managing your Housekeeping? Y   Comment Family assist     Patient Care Team: [name], MD as PCP - General (Family Medicine) Mariah Milling Tollie Pizza, MD as PCP - Cardiology (Cardiology) [name], MD as Consulting Physician (Cardiology)  Indicate any recent Medical Services you may have received from other than [facility] providers in the past year (date may be approximate).     Assessment:   This is a routine wellness examination for Armel.  Virtual Visit via Telephone Note  I connected with  Danny BALENT Sr. on 12/12/21 at 10:45 AM EDT by telephone and verified that  I am speaking with the correct person using two identifiers.  Persons participating in the virtual visit: patient/nephew/Nurse Health Advisor   I discussed the limitations of performing an evaluation and management service by telehealth. The patient expressed understanding and agreed to proceed. We continued and  completed visit with audio only. Some vital signs may be absent or patient reported.   Hearing/Vision screen Hearing Screening - Comments:: Patient is able to hear conversational tones without difficulty.  No issues reported. Vision Screening - Comments:: Followed by [facility]  Wears corrective lenses  No retinopathy reported  Plans to schedule and keep annual visits   Dietary issues and exercise activities discussed: Current Exercise Habits: Structured exercise class (PT/OT once a week), Time (Minutes): 60, Frequency (Times/Week): 1, Weekly Exercise (Minutes/Week): 60, Intensity: Mild    Goals Addressed             This Visit's Progress    Follow up with Primary Care Provider       Keep all routine visits as scheduled        Depression Screen    12/12/2021   11:48 AM 12/08/2020   12:48 PM 09/30/2020   11:08 AM 06/29/2020    9:34 AM 03/29/2020   10:42 AM 03/29/2020   10:23 AM 12/08/2019   11:44 AM  PHQ 2/9 Scores  PHQ - 2 Score 0 0 0 0 0 0 1    Fall Risk    12/12/2021   11:50 AM 01/24/2021   11:56 AM 09/30/2020   11:08 AM 06/29/2020    9:33 AM 03/29/2020   10:36 AM  Fall Risk   Falls in the past year? 1 1 0 1 1  Number falls in past yr: 1 1 0 0 0  Injury with Fall? 0 1   1  Risk for fall due to : Impaired mobility Impaired mobility;Impaired balance/gait     Risk for fall due to: Comment Wheelchair      Follow up Falls evaluation completed Falls evaluation completed Falls evaluation completed Falls evaluation completed Falls evaluation completed    FALL RISK PREVENTION PERTAINING TO THE HOME: Home free of loose throw rugs in walkways, pet beds, electrical cords, etc? Yes  Adequate lighting in your home to reduce risk of falls? Yes   ASSISTIVE DEVICES UTILIZED TO PREVENT FALLS: Life alert? No  Use of a cane, walker or w/c? Yes   TIMED UP AND GO: Was the test performed? No .   Cognitive Function:  Patient is alert.       12/08/2020   12:54 PM 12/08/2019    11:53 AM 03/20/2018    9:46 AM  6CIT Screen  What Year? 0 points 0 points 0 points  What month? 0 points 0 points 0 points  What time? 0 points 0 points 0 points  Count back from 20 0 points 0 points 0 points  Months in reverse 0 points 4 points 4 points  Repeat phrase  4 points   Total Score  8 points     Immunizations Immunization History  Administered Date(s) Administered   Fluad Quad(high Dose 65+) 08/02/2019, 09/30/2020, 08/24/2021   Influenza, High Dose Seasonal PF 09/01/2018   Moderna Sars-Covid-2 Vaccination 04/07/2020, 05/09/2020   Pneumococcal Polysaccharide-23 08/02/2019   Tdap 11/28/2020   Pneumococcal vaccine status: Due, Education has been provided regarding the importance of this vaccine. Advised may receive this vaccine at local pharmacy or Health Dept. Aware to provide a copy of the vaccination record if  obtained from local pharmacy or Health Dept. Verbalized acceptance and understanding. Deferred.    Shingrix Completed?: No.    Education has been provided regarding the importance of this vaccine. Patient has been advised to call insurance company to determine out of pocket expense if they have not yet received this vaccine. Advised may also receive vaccine at local pharmacy or Health Dept. Verbalized acceptance and understanding.  Screening Tests Health Maintenance  Topic Date Due   FOOT EXAM  12/13/2021 (Originally 03/29/2021)   OPHTHALMOLOGY EXAM  02/03/2022 (Originally 04/26/2019)   Zoster Vaccines- Shingrix (1 of 2) 03/14/2022 (Originally 01/22/2001)   COVID-19 Vaccine (3 - Booster for Moderna series) 09/06/2022 (Originally 07/04/2020)   Pneumonia Vaccine 81+ Years old (2 - PCV) 12/13/2022 (Originally 08/01/2020)   COLONOSCOPY (Pts 45-26yrs Insurance coverage will need to be confirmed)  12/13/2022 (Originally 01/23/1996)   INFLUENZA VACCINE  03/06/2022   HEMOGLOBIN A1C  04/02/2022   TETANUS/TDAP  11/29/2030   Hepatitis C Screening  Completed   HPV VACCINES   Aged Out   Health Maintenance There are no preventive care reminders to display for this patient.  Colonoscopy- deferred per patient.   Vision Screening: Recommended annual ophthalmology exams for early detection of glaucoma and other disorders of the eye.  Dental Screening: Recommended annual dental exams for proper oral hygiene  Community Resource Referral / Chronic Care Management: CRR required this visit?  No   CCM required this visit?  No      Plan:   Keep all routine maintenance appointments.   Follow up 12/13/21 with PCP.  Patient is staying with nephew and niece through recovery period. Complains of dizziness, headache, urinating on self due to urgency and fluctuating BP for the last 2 weeks with spot check readings. Numbers are inconsistent and can range from 140/112 to 179/99. Taking BP while lying down only due to dizziness. HHRN, PT and OT one day weekly visits since discharge on 11/20/21. See chart.  Patient denies pain. Nurse encourages patient and nephew to go to nearest ED for worsening symptoms. Declines due to distance of travel to facility from residence and agrees to keep scheduled appointment with PCP tomorrow, bring all medicines and BP cuff.   I have personally reviewed and noted the following in the patient's chart:   Medical and social history Use of alcohol, tobacco or illicit drugs  Current medications and supplements including opioid prescriptions. Patient is currently taking opioid prescriptions. Information provided to patient regarding non-opioid alternatives. Patient advised to discuss non-opioid treatment plan with their provider. Taking Fioricet. Followed by Dorcas Carrow, MD Functional ability and status Nutritional status Physical activity Advanced directives List of other physicians Hospitalizations, surgeries, and ER visits in previous 12 months Vitals Screenings to include cognitive, depression, and falls Referrals and appointments  In  addition, I have reviewed and discussed with patient certain preventive protocols, quality metrics, and best practice recommendations. A written personalized care plan for preventive services as well as general preventive health recommendations were provided to patient.     Ashok Pall, LPN   08/11/1094

## 2021-12-12 NOTE — Telephone Encounter (Signed)
Noted  

## 2021-12-13 ENCOUNTER — Ambulatory Visit: Payer: Medicare Other | Admitting: Family Medicine

## 2021-12-15 ENCOUNTER — Ambulatory Visit: Payer: Medicare Other | Admitting: Family Medicine

## 2021-12-15 DIAGNOSIS — E1122 Type 2 diabetes mellitus with diabetic chronic kidney disease: Secondary | ICD-10-CM | POA: Diagnosis not present

## 2021-12-15 DIAGNOSIS — E1142 Type 2 diabetes mellitus with diabetic polyneuropathy: Secondary | ICD-10-CM | POA: Diagnosis not present

## 2021-12-15 DIAGNOSIS — Z794 Long term (current) use of insulin: Secondary | ICD-10-CM | POA: Diagnosis not present

## 2021-12-15 DIAGNOSIS — I2510 Atherosclerotic heart disease of native coronary artery without angina pectoris: Secondary | ICD-10-CM | POA: Diagnosis not present

## 2021-12-15 DIAGNOSIS — Z87891 Personal history of nicotine dependence: Secondary | ICD-10-CM | POA: Diagnosis not present

## 2021-12-15 DIAGNOSIS — I5042 Chronic combined systolic (congestive) and diastolic (congestive) heart failure: Secondary | ICD-10-CM | POA: Diagnosis not present

## 2021-12-15 DIAGNOSIS — I130 Hypertensive heart and chronic kidney disease with heart failure and stage 1 through stage 4 chronic kidney disease, or unspecified chronic kidney disease: Secondary | ICD-10-CM | POA: Diagnosis not present

## 2021-12-15 DIAGNOSIS — N183 Chronic kidney disease, stage 3 unspecified: Secondary | ICD-10-CM | POA: Diagnosis not present

## 2021-12-15 DIAGNOSIS — R531 Weakness: Secondary | ICD-10-CM | POA: Diagnosis not present

## 2021-12-15 DIAGNOSIS — J439 Emphysema, unspecified: Secondary | ICD-10-CM | POA: Diagnosis not present

## 2021-12-15 DIAGNOSIS — E1165 Type 2 diabetes mellitus with hyperglycemia: Secondary | ICD-10-CM | POA: Diagnosis not present

## 2021-12-15 DIAGNOSIS — E785 Hyperlipidemia, unspecified: Secondary | ICD-10-CM | POA: Diagnosis not present

## 2021-12-15 DIAGNOSIS — Z7984 Long term (current) use of oral hypoglycemic drugs: Secondary | ICD-10-CM | POA: Diagnosis not present

## 2021-12-15 DIAGNOSIS — Z9181 History of falling: Secondary | ICD-10-CM | POA: Diagnosis not present

## 2021-12-15 DIAGNOSIS — F32A Depression, unspecified: Secondary | ICD-10-CM | POA: Diagnosis not present

## 2021-12-15 DIAGNOSIS — Z7985 Long-term (current) use of injectable non-insulin antidiabetic drugs: Secondary | ICD-10-CM | POA: Diagnosis not present

## 2021-12-15 DIAGNOSIS — Z8673 Personal history of transient ischemic attack (TIA), and cerebral infarction without residual deficits: Secondary | ICD-10-CM | POA: Diagnosis not present

## 2021-12-15 DIAGNOSIS — I5022 Chronic systolic (congestive) heart failure: Secondary | ICD-10-CM | POA: Diagnosis not present

## 2021-12-15 DIAGNOSIS — Z89512 Acquired absence of left leg below knee: Secondary | ICD-10-CM | POA: Diagnosis not present

## 2021-12-15 DIAGNOSIS — N179 Acute kidney failure, unspecified: Secondary | ICD-10-CM | POA: Diagnosis not present

## 2021-12-15 DIAGNOSIS — I739 Peripheral vascular disease, unspecified: Secondary | ICD-10-CM | POA: Diagnosis not present

## 2021-12-18 ENCOUNTER — Encounter: Payer: Self-pay | Admitting: Family Medicine

## 2021-12-18 ENCOUNTER — Ambulatory Visit (HOSPITAL_COMMUNITY)
Admission: RE | Admit: 2021-12-18 | Discharge: 2021-12-18 | Disposition: A | Discharge status: Home / Self Care | Payer: Medicare Other | Source: Ambulatory Visit | Attending: Family Medicine | Admitting: Family Medicine

## 2021-12-18 ENCOUNTER — Ambulatory Visit (INDEPENDENT_AMBULATORY_CARE_PROVIDER_SITE_OTHER): Payer: Medicare Other | Admitting: Family Medicine

## 2021-12-18 VITALS — BP 90/60 | HR 91 | Temp 97.9°F | Ht 72.0 in | Wt 236.0 lb

## 2021-12-18 DIAGNOSIS — E785 Hyperlipidemia, unspecified: Secondary | ICD-10-CM

## 2021-12-18 DIAGNOSIS — I615 Nontraumatic intracerebral hemorrhage, intraventricular: Secondary | ICD-10-CM

## 2021-12-18 DIAGNOSIS — R55 Syncope and collapse: Secondary | ICD-10-CM | POA: Diagnosis not present

## 2021-12-18 DIAGNOSIS — E1169 Type 2 diabetes mellitus with other specified complication: Secondary | ICD-10-CM | POA: Diagnosis not present

## 2021-12-18 DIAGNOSIS — R0781 Pleurodynia: Secondary | ICD-10-CM | POA: Diagnosis not present

## 2021-12-18 DIAGNOSIS — J432 Centrilobular emphysema: Secondary | ICD-10-CM | POA: Diagnosis not present

## 2021-12-18 DIAGNOSIS — I639 Cerebral infarction, unspecified: Secondary | ICD-10-CM | POA: Diagnosis not present

## 2021-12-18 LAB — COMPREHENSIVE METABOLIC PANEL
ALT: 13 U/L (ref 0–53)
AST: 10 U/L (ref 0–37)
Albumin: 4 g/dL (ref 3.5–5.2)
Alkaline Phosphatase: 101 U/L (ref 39–117)
BUN: 37 mg/dL — ABNORMAL HIGH (ref 6–23)
CO2: 25 mEq/L (ref 19–32)
Calcium: 9.6 mg/dL (ref 8.4–10.5)
Chloride: 105 mEq/L (ref 96–112)
Creatinine, Ser: 1.23 mg/dL (ref 0.40–1.50)
GFR: 59.32 mL/min — ABNORMAL LOW (ref >60.00)
Glucose, Bld: 194 mg/dL — ABNORMAL HIGH (ref 70–99)
Potassium: 4.2 mEq/L (ref 3.5–5.1)
Sodium: 139 mEq/L (ref 135–145)
Total Bilirubin: 0.5 mg/dL (ref 0.2–1.2)
Total Protein: 6.5 g/dL (ref 6.0–8.3)

## 2021-12-18 LAB — CBC
HCT: 46.1 % (ref 39.0–52.0)
Hemoglobin: 15.1 g/dL (ref 13.0–17.0)
MCHC: 32.8 g/dL (ref 30.0–36.0)
MCV: 95.4 fl (ref 78.0–100.0)
Platelets: 287 10*3/uL (ref 150.0–400.0)
RBC: 4.83 Mil/uL (ref 4.22–5.81)
RDW: 15.1 % (ref 11.5–15.5)
WBC: 8.7 10*3/uL (ref 4.0–10.5)

## 2021-12-18 MED ORDER — GADOBUTROL 1 MMOL/ML IV SOLN
9.0000 mL | Freq: Once | INTRAVENOUS | Status: AC | PRN
  Administered 2021-12-18: 9 mL via INTRAVENOUS

## 2021-12-18 MED ORDER — PROAIR RESPICLICK 108 (90 BASE) MCG/ACT IN AEPB: 1.0000 | INHALATION_SPRAY | Freq: Four times a day (QID) | RESPIRATORY_TRACT | 12 refills | Status: DC | PRN

## 2021-12-18 NOTE — Assessment & Plan Note (Signed)
The patient's syncope could be related to his low blood pressures or some other cardiac issue.  We are requesting records from the hospital that evaluated him.  He does have an appoint with cardiology next month and he will keep that.  We will discontinue his amlodipine.  They will monitor his blood pressure and lightheadedness.  If he has any recurrent symptoms he will be reevaluated.  If his blood pressure does not trend up and his lightheadedness does not resolve they will let us know.  I will see him back in about a month. ?

## 2021-12-18 NOTE — Assessment & Plan Note (Signed)
Neurologically seems to be recovering well.  He will have his MRI today and follow-up with neurology.  I discussed that certainly his change in bowel and bladder habits could be related to his stroke given the time course of onset. ?

## 2021-12-18 NOTE — Assessment & Plan Note (Signed)
Discussed getting a x-ray of his ribs today but the patient deferred this given that we could not complete this here in the office given his inability to stand status post BKA on the left.  He will monitor and we can consider imaging in the future. ?
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
## 2021-12-18 NOTE — Patient Instructions (Signed)
Nice to see you. ?We will get lab work and an x-ray today. ?Please see cardiology as scheduled. ?If you have recurrent issues with passing out or you develop palpitations, worsening breathing issues, or chest pain please seek medical attention immediately. ?We will have you stop your amlodipine.  If your blood pressure or lightheadedness are not improving in the next few days with stopping this medication please let me know. ?

## 2021-12-18 NOTE — Assessment & Plan Note (Addendum)
Patient has decent control with reported glucose readings.  He will continue Trulicity 4.5 mg weekly, Jardiance 25 mg daily, and sliding scale NovoLog.  Most recent A1c 7.3. ?

## 2021-12-19 ENCOUNTER — Encounter (INDEPENDENT_AMBULATORY_CARE_PROVIDER_SITE_OTHER): Payer: Medicare Other | Admitting: Nurse Practitioner

## 2021-12-19 ENCOUNTER — Encounter (INDEPENDENT_AMBULATORY_CARE_PROVIDER_SITE_OTHER): Payer: Medicare Other

## 2021-12-19 DIAGNOSIS — I2510 Atherosclerotic heart disease of native coronary artery without angina pectoris: Secondary | ICD-10-CM | POA: Diagnosis not present

## 2021-12-19 DIAGNOSIS — F32A Depression, unspecified: Secondary | ICD-10-CM | POA: Diagnosis not present

## 2021-12-19 DIAGNOSIS — N183 Chronic kidney disease, stage 3 unspecified: Secondary | ICD-10-CM | POA: Diagnosis not present

## 2021-12-19 DIAGNOSIS — Z794 Long term (current) use of insulin: Secondary | ICD-10-CM | POA: Diagnosis not present

## 2021-12-19 DIAGNOSIS — E1142 Type 2 diabetes mellitus with diabetic polyneuropathy: Secondary | ICD-10-CM | POA: Diagnosis not present

## 2021-12-19 DIAGNOSIS — E785 Hyperlipidemia, unspecified: Secondary | ICD-10-CM | POA: Diagnosis not present

## 2021-12-19 DIAGNOSIS — Z87891 Personal history of nicotine dependence: Secondary | ICD-10-CM | POA: Diagnosis not present

## 2021-12-19 DIAGNOSIS — Z89512 Acquired absence of left leg below knee: Secondary | ICD-10-CM | POA: Diagnosis not present

## 2021-12-19 DIAGNOSIS — Z7984 Long term (current) use of oral hypoglycemic drugs: Secondary | ICD-10-CM | POA: Diagnosis not present

## 2021-12-19 DIAGNOSIS — E1122 Type 2 diabetes mellitus with diabetic chronic kidney disease: Secondary | ICD-10-CM | POA: Diagnosis not present

## 2021-12-19 DIAGNOSIS — J439 Emphysema, unspecified: Secondary | ICD-10-CM | POA: Diagnosis not present

## 2021-12-19 DIAGNOSIS — I5042 Chronic combined systolic (congestive) and diastolic (congestive) heart failure: Secondary | ICD-10-CM | POA: Diagnosis not present

## 2021-12-19 DIAGNOSIS — I130 Hypertensive heart and chronic kidney disease with heart failure and stage 1 through stage 4 chronic kidney disease, or unspecified chronic kidney disease: Secondary | ICD-10-CM | POA: Diagnosis not present

## 2021-12-19 DIAGNOSIS — E1165 Type 2 diabetes mellitus with hyperglycemia: Secondary | ICD-10-CM | POA: Diagnosis not present

## 2021-12-19 DIAGNOSIS — Z7985 Long-term (current) use of injectable non-insulin antidiabetic drugs: Secondary | ICD-10-CM | POA: Diagnosis not present

## 2021-12-19 DIAGNOSIS — Z8673 Personal history of transient ischemic attack (TIA), and cerebral infarction without residual deficits: Secondary | ICD-10-CM | POA: Diagnosis not present

## 2021-12-19 DIAGNOSIS — Z9181 History of falling: Secondary | ICD-10-CM | POA: Diagnosis not present

## 2021-12-20 ENCOUNTER — Telehealth: Payer: Self-pay | Admitting: *Deleted

## 2021-12-20 ENCOUNTER — Ambulatory Visit: Payer: Medicare Other | Admitting: Cardiovascular Disease

## 2021-12-20 ENCOUNTER — Ambulatory Visit: Payer: Medicare Other | Admitting: Family Medicine

## 2021-12-20 DIAGNOSIS — I2510 Atherosclerotic heart disease of native coronary artery without angina pectoris: Secondary | ICD-10-CM | POA: Diagnosis not present

## 2021-12-20 DIAGNOSIS — E1122 Type 2 diabetes mellitus with diabetic chronic kidney disease: Secondary | ICD-10-CM | POA: Diagnosis not present

## 2021-12-20 DIAGNOSIS — N183 Chronic kidney disease, stage 3 unspecified: Secondary | ICD-10-CM | POA: Diagnosis not present

## 2021-12-20 DIAGNOSIS — I5042 Chronic combined systolic (congestive) and diastolic (congestive) heart failure: Secondary | ICD-10-CM | POA: Diagnosis not present

## 2021-12-20 DIAGNOSIS — E785 Hyperlipidemia, unspecified: Secondary | ICD-10-CM | POA: Diagnosis not present

## 2021-12-20 DIAGNOSIS — F32A Depression, unspecified: Secondary | ICD-10-CM | POA: Diagnosis not present

## 2021-12-20 DIAGNOSIS — Z794 Long term (current) use of insulin: Secondary | ICD-10-CM | POA: Diagnosis not present

## 2021-12-20 DIAGNOSIS — J439 Emphysema, unspecified: Secondary | ICD-10-CM | POA: Diagnosis not present

## 2021-12-20 DIAGNOSIS — E1142 Type 2 diabetes mellitus with diabetic polyneuropathy: Secondary | ICD-10-CM | POA: Diagnosis not present

## 2021-12-20 DIAGNOSIS — Z9181 History of falling: Secondary | ICD-10-CM | POA: Diagnosis not present

## 2021-12-20 DIAGNOSIS — I130 Hypertensive heart and chronic kidney disease with heart failure and stage 1 through stage 4 chronic kidney disease, or unspecified chronic kidney disease: Secondary | ICD-10-CM | POA: Diagnosis not present

## 2021-12-20 DIAGNOSIS — Z87891 Personal history of nicotine dependence: Secondary | ICD-10-CM | POA: Diagnosis not present

## 2021-12-20 DIAGNOSIS — Z8673 Personal history of transient ischemic attack (TIA), and cerebral infarction without residual deficits: Secondary | ICD-10-CM | POA: Diagnosis not present

## 2021-12-20 DIAGNOSIS — E1165 Type 2 diabetes mellitus with hyperglycemia: Secondary | ICD-10-CM | POA: Diagnosis not present

## 2021-12-20 DIAGNOSIS — Z7985 Long-term (current) use of injectable non-insulin antidiabetic drugs: Secondary | ICD-10-CM | POA: Diagnosis not present

## 2021-12-20 DIAGNOSIS — Z89512 Acquired absence of left leg below knee: Secondary | ICD-10-CM | POA: Diagnosis not present

## 2021-12-20 DIAGNOSIS — Z7984 Long term (current) use of oral hypoglycemic drugs: Secondary | ICD-10-CM | POA: Diagnosis not present

## 2021-12-20 NOTE — Telephone Encounter (Signed)
Nephew, Danny [name] not on [employer], unable to speak with him. I spoke with son on 12/07/21 and he was going to speak with Onalee Hua and call us back but we did not get a return call. I called son again at [phone]. He is unsure if father back on ASA/Plavix, never found out. He will call Onalee Hua now and then call us back. He knows he was taken off a BP pill this past Monday but does not remember the name. ?

## 2021-12-20 NOTE — Telephone Encounter (Signed)
-----   Message from Debbora Presto, NP sent at 12/20/2021  7:24 AM EDT ----- ?Can you guys please call his caregiver to make sure he has resumed Plavix and asa. MRI is stable. We can see where the old hemorrhage was but no new bleeding. Please have him continue close follow up with his care team.  ?

## 2021-12-20 NOTE — Telephone Encounter (Signed)
Son called back and confirmed his father is back on ASA and Plavix. Aware of MRI results and next f/u. They will call back if anything further is needed moving forward.  ?

## 2021-12-25 ENCOUNTER — Other Ambulatory Visit: Payer: Self-pay | Admitting: Family Medicine

## 2021-12-25 ENCOUNTER — Telehealth: Payer: Self-pay | Admitting: Family Medicine

## 2021-12-25 DIAGNOSIS — I615 Nontraumatic intracerebral hemorrhage, intraventricular: Secondary | ICD-10-CM

## 2021-12-25 NOTE — Telephone Encounter (Signed)
Samantha from [facility] called stating pt is moving back home so he would need another agency that can do his care

## 2021-12-27 NOTE — Telephone Encounter (Signed)
Home health order placed.

## 2022-01-03 ENCOUNTER — Other Ambulatory Visit: Payer: Self-pay

## 2022-01-03 ENCOUNTER — Telehealth: Payer: Self-pay | Admitting: Family Medicine

## 2022-01-03 DIAGNOSIS — E785 Hyperlipidemia, unspecified: Secondary | ICD-10-CM

## 2022-01-03 DIAGNOSIS — I25118 Atherosclerotic heart disease of native coronary artery with other forms of angina pectoris: Secondary | ICD-10-CM

## 2022-01-03 DIAGNOSIS — I10 Essential (primary) hypertension: Secondary | ICD-10-CM

## 2022-01-03 DIAGNOSIS — E1022 Type 1 diabetes mellitus with diabetic chronic kidney disease: Secondary | ICD-10-CM

## 2022-01-03 DIAGNOSIS — I5032 Chronic diastolic (congestive) heart failure: Secondary | ICD-10-CM

## 2022-01-03 MED ORDER — EMPAGLIFLOZIN 25 MG PO TABS: 25.0000 mg | ORAL_TABLET | Freq: Every day | ORAL | 3 refills | Status: DC

## 2022-01-03 MED ORDER — ESCITALOPRAM OXALATE 10 MG PO TABS: 10.0000 mg | ORAL_TABLET | Freq: Every day | ORAL | 1 refills | Status: DC

## 2022-01-03 MED ORDER — CARVEDILOL 25 MG PO TABS: 25.0000 mg | ORAL_TABLET | Freq: Two times a day (BID) | ORAL | 0 refills | Status: DC

## 2022-01-03 MED ORDER — ATORVASTATIN CALCIUM 80 MG PO TABS: 80.0000 mg | ORAL_TABLET | Freq: Every day | ORAL | 3 refills | Status: DC

## 2022-01-03 MED ORDER — TRULICITY 4.5 MG/0.5ML ~~LOC~~ SOAJ: 4.5000 mg | SUBCUTANEOUS | 3 refills | Status: DC

## 2022-01-03 MED ORDER — FUROSEMIDE 20 MG PO TABS: ORAL_TABLET | ORAL | 3 refills | Status: DC

## 2022-01-03 NOTE — Progress Notes (Unsigned)
Lexapro sent to pharmacy. 

## 2022-01-03 NOTE — Telephone Encounter (Signed)
Pt need refill on Furosemide, carvedilo and escitalopram sent to [facility]

## 2022-01-03 NOTE — Telephone Encounter (Signed)
Josh from [employer] called stating pt does not know if they should be taking metformin and what dosage. Pt also need a refill on jardiance  trulicity and atorvastatin  sent to [facility]

## 2022-01-03 NOTE — Telephone Encounter (Signed)
I CALLED AND SPOKE WITH A REPRESENTATIVE WITH [employer] AND I INFORMED THEM THAT THE PATIENT IS SUPPOSE TO BE ON METFORMIN 500 MG TWICE DAILY  AND I INFORMED HER THAT I DID HIS REFILLS AS WELL AND SHE UNDERSTOOD AND WILL SPEAK WITH THE PATIENTS.  Lesslie Mckeehan,CMA

## 2022-01-04 DIAGNOSIS — E161 Other hypoglycemia: Secondary | ICD-10-CM | POA: Diagnosis not present

## 2022-01-04 DIAGNOSIS — E162 Hypoglycemia, unspecified: Secondary | ICD-10-CM | POA: Diagnosis not present

## 2022-01-04 DIAGNOSIS — R61 Generalized hyperhidrosis: Secondary | ICD-10-CM | POA: Diagnosis not present

## 2022-01-04 DIAGNOSIS — R410 Disorientation, unspecified: Secondary | ICD-10-CM | POA: Diagnosis not present

## 2022-01-05 ENCOUNTER — Other Ambulatory Visit (INDEPENDENT_AMBULATORY_CARE_PROVIDER_SITE_OTHER): Payer: Self-pay | Admitting: Vascular Surgery

## 2022-01-05 DIAGNOSIS — Z95820 Peripheral vascular angioplasty status with implants and grafts: Secondary | ICD-10-CM

## 2022-01-05 DIAGNOSIS — I70239 Atherosclerosis of native arteries of right leg with ulceration of unspecified site: Secondary | ICD-10-CM

## 2022-01-08 ENCOUNTER — Ambulatory Visit (INDEPENDENT_AMBULATORY_CARE_PROVIDER_SITE_OTHER): Payer: Medicare Other

## 2022-01-08 ENCOUNTER — Ambulatory Visit (INDEPENDENT_AMBULATORY_CARE_PROVIDER_SITE_OTHER): Payer: Medicare Other | Admitting: Nurse Practitioner

## 2022-01-08 ENCOUNTER — Encounter (INDEPENDENT_AMBULATORY_CARE_PROVIDER_SITE_OTHER): Payer: Self-pay | Admitting: Nurse Practitioner

## 2022-01-08 ENCOUNTER — Telehealth: Payer: Self-pay | Admitting: Family Medicine

## 2022-01-08 VITALS — BP 164/79 | HR 53 | Resp 17

## 2022-01-08 DIAGNOSIS — I70239 Atherosclerosis of native arteries of right leg with ulceration of unspecified site: Secondary | ICD-10-CM

## 2022-01-08 DIAGNOSIS — E1169 Type 2 diabetes mellitus with other specified complication: Secondary | ICD-10-CM

## 2022-01-08 DIAGNOSIS — Z95820 Peripheral vascular angioplasty status with implants and grafts: Secondary | ICD-10-CM

## 2022-01-08 DIAGNOSIS — I10 Essential (primary) hypertension: Secondary | ICD-10-CM | POA: Diagnosis not present

## 2022-01-08 DIAGNOSIS — E785 Hyperlipidemia, unspecified: Secondary | ICD-10-CM

## 2022-01-08 NOTE — Progress Notes (Signed)
Subjective:    Patient ID: Danny Organ Sr., male    DOB: 09-Dec-1950, 71 y.o.   MRN: [id number] No chief complaint on file.   The patient returns to the office for followup and review status post angiogram with intervention on 11/20/2021.   Procedure: Procedure(s) Performed:             1.  Ultrasound guidance for vascular access left femoral artery             2.  Catheter placement into right SFA from left femoral approach             3.  Aortogram and selective right lower extremity angiogram             4.  Percutaneous transluminal angioplasty of right peroneal artery with 3 mm diameter by 15 cm length angioplasty balloon             5.  Mechanical thrombectomy to the right SFA and popliteal artery to debulk chronic thrombus from the occlusion with the R[place] Rex device             6.  Percutaneous transluminal angioplasty of the right distal SFA and above-knee popliteal artery with 5 mm diameter by 15 cm length Lutonix drug-coated angioplasty balloon             7.  Viabahn stent placement to the right popliteal artery with 6 mm diameter by 5 cm length Viabahn stent             8.  StarClose closure device left femoral artery  The patient notes improvement in the lower extremity symptoms. No interval shortening of the patient's claudication distance or rest pain symptoms. No new ulcers or wounds have occurred since the last visit.  Patient has a left below-knee amputation  There have been no significant changes to the patient's overall health care.  No documented history of amaurosis fugax or recent TIA symptoms. There are no recent neurological changes noted. No documented history of DVT, PE or superficial thrombophlebitis. The patient denies recent episodes of angina or shortness of breath.   TBI's Rt=0.93 and Lt=n/a (ABIs are noncompressible) (previous TBI's Rt=0.47 and Lt=n/a) Duplex US of the tibial arteries shows biphasic waveforms in the right lower extremity.  The distal  anterior tibial artery is absent.   Review of Systems  Musculoskeletal:  Positive for arthralgias.  Neurological:  Positive for weakness.  All other systems reviewed and are negative.     Objective:   Physical Exam Vitals reviewed.  HENT:     Head: Normocephalic.  Cardiovascular:     Rate and Rhythm: Normal rate.     Pulses:          Dorsalis pedis pulses are detected w/ Doppler on the right side.       Posterior tibial pulses are detected w/ Doppler on the right side.  Pulmonary:     Effort: Pulmonary effort is normal.  Skin:    General: Skin is warm and dry.  Neurological:     Mental Status: He is alert and oriented to person, place, and time.  Psychiatric:        Mood and Affect: Mood normal.        Behavior: Behavior normal.        Thought Content: Thought content normal.        Judgment: Judgment normal.    BP (!) 164/79 (BP Location: Left Arm)   Pulse (!) 53  Resp 17   Past Medical History:  Diagnosis Date   CAD (coronary artery disease)    a. 01/2010 PCI of LAD; b. 09/2014 PCI/DES of mLAD due to ISR. D1 80, D2 80(jailed), LCX 5m; c. 07/2016 NSTEMI/Cath: LM nl, LAD 61m ISR, 40d, D1 90ost, 80p, D2 90ost, RI min irregs, LCX min irregs, OM1 90 small, OM2/3 min irregs, RCA min irregs, RPLB1 90, EF 25-35%.   Chronic combined systolic and diastolic CHF (congestive heart failure) ([place])    a. 07/2016 Echo: EF 30%, severe septal/anterior HK, Gr1 DD.   CKD (chronic kidney disease), stage III (HCC)    COPD (chronic obstructive pulmonary disease) (HCC)    DM2 (diabetes mellitus, type 2) (HCC)    Erectile dysfunction    HLD (hyperlipidemia)    HTN (hypertension)    Ischemic cardiomyopathy    a. 07/2016 Echo: EF 30% w/ sev septal/ant HK. Gr1 DD.    Social History   Socioeconomic History   Marital status: Single    Spouse name: Not on file   Number of children: Not on file   Years of education: Not on file   Highest education level: Not on file  Occupational History    Not on file  Tobacco Use   Smoking status: Former    Packs/day: 3.00    Years: 0.00    Pack years: 0.00    Types: Cigarettes    Quit date: 09/07/2009    Years since quitting: 12.3   Smokeless tobacco: Never   Tobacco comments:    quit june 2011  Vaping Use   Vaping Use: Never used  Substance and Sexual Activity   Alcohol use: Not Currently    Alcohol/week: 7.0 standard drinks    Types: 6 Cans of beer, 1 Standard drinks or equivalent per week    Comment: every other weekend   Drug use: Never   Sexual activity: Not on file  Other Topics Concern   Not on file  Social History Narrative   Not on file   Social Determinants of Health   Financial Resource Strain: Low Risk    Difficulty of Paying Living Expenses: Not hard at all  Food Insecurity: No Food Insecurity   Worried About Charity fundraiser in the Last Year: Never true   [place] in the Last Year: Never true  Transportation Needs: No Transportation Needs   Lack of Transportation (Medical): No   Lack of Transportation (Non-Medical): No  Physical Activity: Unknown   Days of Exercise per Week: 0 days   Minutes of Exercise per Session: Not on file  Stress: No Stress Concern Present   Feeling of Stress : Only a little  Social Connections: Unknown   Frequency of Communication with Friends and Family: Not on file   Frequency of Social Gatherings with Friends and Family: More than three times a week   Attends Religious Services: Not on file   Active Member of Clubs or Organizations: Not on file   Attends Archivist Meetings: Not on file   Marital Status: Not on file  Intimate Partner Violence: Not At Risk   Fear of Current or Ex-Partner: No   Emotionally Abused: No   Physically Abused: No   Sexually Abused: No    Past Surgical History:  Procedure Laterality Date   AMPUTATION Left 11/10/2020   Procedure: AMPUTATION BELOW KNEE;  Surgeon: [name], MD;  Location: [facility] ORS;  Service: General;   Laterality: Left;  BELOW KNEE LEG AMPUTATION     CAD: stent to the LAD     CARDIAC CATHETERIZATION  10/01/2014   CARDIAC CATHETERIZATION N/A 07/23/2016   Procedure: Left Heart Cath and Coronary Angiography;  Surgeon: Iran Ouch, MD;  Location: [facility] INVASIVE CV LAB;  Service: Cardiovascular;  Laterality: N/A;   CORONARY ANGIOPLASTY WITH STENT PLACEMENT  10/01/2014   LOWER EXTREMITY ANGIOGRAPHY Left 10/31/2020   Procedure: LOWER EXTREMITY ANGIOGRAPHY;  Surgeon: Annice Needy, MD;  Location: [facility] INVASIVE CV LAB;  Service: Cardiovascular;  Laterality: Left;   LOWER EXTREMITY ANGIOGRAPHY Right 11/20/2021   Procedure: Lower Extremity Angiography;  Surgeon: Annice Needy, MD;  Location: [facility] INVASIVE CV LAB;  Service: Cardiovascular;  Laterality: Right;    Family History  Problem Relation Age of Onset   Heart attack Father        complications   Cancer Brother     Allergies  Allergen Reactions   Metformin And Related Diarrhea    If he takes it twice a day it causes diarhea, so he reduced to once daily       Latest Ref Rng & Units 12/18/2021   11:35 AM 10/10/2021    1:18 AM 10/07/2021   12:48 AM  CBC  WBC 4.0 - 10.5 K/uL 8.7   10.1   9.2    Hemoglobin 13.0 - 17.0 g/dL 70.9   62.8   36.6    Hematocrit 39.0 - 52.0 % 46.1   43.2   45.5    Platelets 150.0 - 400.0 K/uL 287.0   266   243        CMP     Component Value Date/Time   NA 139 12/18/2021 1135   NA 135 11/23/2014 2006   K 4.2 12/18/2021 1135   K 4.0 11/23/2014 2006   CL 105 12/18/2021 1135   CL 100 (L) 11/23/2014 2006   CO2 25 12/18/2021 1135   CO2 24 11/23/2014 2006   GLUCOSE 194 (H) 12/18/2021 1135   GLUCOSE 370 (H) 11/23/2014 2006   BUN 37 (H) 12/18/2021 1135   BUN 40 (H) 11/23/2014 2006   CREATININE 1.23 12/18/2021 1135   CREATININE 2.16 (H) 11/23/2014 2006   CALCIUM 9.6 12/18/2021 1135   CALCIUM 8.6 (L) 11/23/2014 2006   PROT 6.5 12/18/2021 1135   PROT 7.3 11/23/2014 2006   ALBUMIN 4.0 12/18/2021 1135    ALBUMIN 4.0 11/23/2014 2006   AST 10 12/18/2021 1135   AST 18 11/23/2014 2006   ALT 13 12/18/2021 1135   ALT 14 (L) 11/23/2014 2006   ALKPHOS 101 12/18/2021 1135   ALKPHOS 96 11/23/2014 2006   BILITOT 0.5 12/18/2021 1135   BILITOT 0.6 11/23/2014 2006   GFRNONAA >60 11/20/2021 0730   GFRNONAA 31 (L) 11/23/2014 2006   GFRAA 37 (L) 12/24/2019 1608   GFRAA 36 (L) 11/23/2014 2006     No results found.     Assessment & Plan:   1. 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  The patient reports that the claudication symptoms and leg pain has improved.   The patient denies lifestyle limiting changes at this point in time.  No further invasive studies, angiography or surgery at this time  The patient should continue antiplatelet therapy and aggressive treatment of the lipid abnormalities  Continued surveillance is indicated as atherosclerosis is likely to progress with time.  Patient should undergo noninvasive studies as ordered. The patient will follow up with me to review the studies.    2. Type 2 diabetes mellitus with hyperlipidemia ([place]) Continue hypoglycemic medications as already ordered, these medications have been reviewed and there are no changes at this time.  Hgb A1C to be monitored as already arranged by primary service   3. Essential hypertension Continue antihypertensive medications as already ordered, these medications have been reviewed and there are no changes at this time.    Current Outpatient Medications on File Prior to Visit  Medication Sig Dispense Refill   acetaminophen (TYLENOL) 325 MG tablet Take 2 tablets (650 mg total) by mouth every 4 (four) hours as needed for mild pain (or temp > 37.5 C (99.5 F)).     albuterol (PROVENTIL) (2.5 MG/3ML) 0.083% nebulizer solution Take 3 mLs (2.5 mg total) by nebulization  every 6 (six) hours as needed for wheezing or shortness of breath. 150 mL 1   Albuterol Sulfate (PROAIR RESPICLICK) 123XX123 (90 Base) MCG/ACT AEPB Inhale 1 puff into the lungs every 6 (six) hours as needed. 1 each 12   aspirin EC 81 MG tablet Take 1 tablet (81 mg total) by mouth daily. 150 tablet 2   atorvastatin (LIPITOR) 80 MG tablet Take 1 tablet (80 mg total) by mouth daily. 90 tablet 3   butalbital-acetaminophen-caffeine (FIORICET) 50-325-40 MG tablet Take 1 tablet by mouth every 8 (eight) hours as needed for headache. 14 tablet 0   carvedilol (COREG) 25 MG tablet Take 1 tablet (25 mg total) by mouth 2 (two) times daily with a meal. 60 tablet 0   clopidogrel (PLAVIX) 75 MG tablet Take 1 tablet (75 mg total) by mouth daily. 30 tablet 11   Continuous Blood Gluc Sensor (FREESTYLE LIBRE 2 SENSOR) MISC USE 1 SENSOR EVERY 14 DAYS 6 each 0   Dulaglutide (TRULICITY) 4.5 0000000 SOPN Inject 4.5 mg as directed once a week. 6 mL 3   empagliflozin (JARDIANCE) 25 MG TABS tablet Take 1 tablet (25 mg total) by mouth daily. 90 tablet 3   ENTRESTO 49-51 MG TAKE 1 TABLET BY MOUTH 2 (TWO) TIMES DAILY. STOP LISINOPRIL AND POTASSIUM 60 tablet 2   escitalopram (LEXAPRO) 10 MG tablet Take 1 tablet (10 mg total) by mouth daily. 90 tablet 1   fluticasone-salmeterol (ADVAIR) 250-50 MCG/ACT AEPB Inhale 1 puff into the lungs 2 (two) times daily. 3 each 1   furosemide (LASIX) 20 MG tablet TAKE 1 TABLET (20MG ) BY MOUTH ONCE EVERY OTHER DAY. 45 tablet 3   gabapentin (NEURONTIN) 300 MG capsule Take 1 capsule (300 mg total) by mouth at bedtime. 30 capsule 3   insulin aspart (NOVOLOG) 100 UNIT/ML injection Inject 0-15 Units into the skin 4 (four) times daily -  before meals and at bedtime. CBG < 70: Implement Hypoglycemia Standing Orders and refer to Hypoglycemia Standing Orders sidebar report CBG 70 - 120: 0 units CBG 121 - 150: 2 units CBG 151 - 200: 3 units CBG 201 - 250: 5 units CBG 251 - 300: 8 units CBG 301 - 350: 11 units CBG  351 - 400: 15 units CBG > 400: call MD 10 mL 11   isosorbide mononitrate (IMDUR) 30 MG 24 hr tablet TAKE 1 TABLET BY MOUTH EVERY DAY 90 tablet 3   metFORMIN (GLUCOPHAGE-XR) 500 MG 24 hr tablet Take 1 tablet (500 mg total) by mouth 2 (two) times daily with a meal. (Patient taking differently: Take 500 mg by mouth daily with  breakfast. Pt reports "tears my stomach up if I take more than once a day") 180 tablet 1   nitroGLYCERIN (NITROSTAT) 0.4 MG SL tablet Place 0.4 mg under the tongue every 5 (five) minutes as needed for chest pain.     ondansetron (ZOFRAN) 4 MG tablet Take 4 mg by mouth every 6 (six) hours as needed.     spironolactone (ALDACTONE) 25 MG tablet Take 1 tablet (25 mg total) by mouth daily. 90 tablet 1   tamsulosin (FLOMAX) 0.4 MG CAPS capsule Take 1 capsule (0.4 mg total) by mouth daily. 90 capsule 0   No current facility-administered medications on file prior to visit.    There are no Patient Instructions on file for this visit. No follow-ups on file.   Kris Hartmann, NP

## 2022-01-08 NOTE — Telephone Encounter (Signed)
Noted  

## 2022-01-08 NOTE — Telephone Encounter (Signed)
Dawanda from [facility] called stating they will be out tomorrow to do skill home nursing care  [phone] ext [phone]

## 2022-01-09 DIAGNOSIS — Z7982 Long term (current) use of aspirin: Secondary | ICD-10-CM | POA: Diagnosis not present

## 2022-01-09 DIAGNOSIS — Z7984 Long term (current) use of oral hypoglycemic drugs: Secondary | ICD-10-CM | POA: Diagnosis not present

## 2022-01-09 DIAGNOSIS — Z89512 Acquired absence of left leg below knee: Secondary | ICD-10-CM | POA: Diagnosis not present

## 2022-01-09 DIAGNOSIS — Z7902 Long term (current) use of antithrombotics/antiplatelets: Secondary | ICD-10-CM | POA: Diagnosis not present

## 2022-01-09 DIAGNOSIS — Z794 Long term (current) use of insulin: Secondary | ICD-10-CM | POA: Diagnosis not present

## 2022-01-09 DIAGNOSIS — Z8673 Personal history of transient ischemic attack (TIA), and cerebral infarction without residual deficits: Secondary | ICD-10-CM | POA: Diagnosis not present

## 2022-01-09 DIAGNOSIS — J432 Centrilobular emphysema: Secondary | ICD-10-CM | POA: Diagnosis not present

## 2022-01-09 DIAGNOSIS — Z87891 Personal history of nicotine dependence: Secondary | ICD-10-CM | POA: Diagnosis not present

## 2022-01-09 DIAGNOSIS — I10 Essential (primary) hypertension: Secondary | ICD-10-CM | POA: Diagnosis not present

## 2022-01-09 DIAGNOSIS — Z9181 History of falling: Secondary | ICD-10-CM | POA: Diagnosis not present

## 2022-01-09 DIAGNOSIS — E1165 Type 2 diabetes mellitus with hyperglycemia: Secondary | ICD-10-CM | POA: Diagnosis not present

## 2022-01-10 DIAGNOSIS — Z9181 History of falling: Secondary | ICD-10-CM | POA: Diagnosis not present

## 2022-01-10 DIAGNOSIS — Z7982 Long term (current) use of aspirin: Secondary | ICD-10-CM | POA: Diagnosis not present

## 2022-01-10 DIAGNOSIS — E1165 Type 2 diabetes mellitus with hyperglycemia: Secondary | ICD-10-CM | POA: Diagnosis not present

## 2022-01-10 DIAGNOSIS — J432 Centrilobular emphysema: Secondary | ICD-10-CM | POA: Diagnosis not present

## 2022-01-10 DIAGNOSIS — Z8673 Personal history of transient ischemic attack (TIA), and cerebral infarction without residual deficits: Secondary | ICD-10-CM | POA: Diagnosis not present

## 2022-01-10 DIAGNOSIS — Z794 Long term (current) use of insulin: Secondary | ICD-10-CM | POA: Diagnosis not present

## 2022-01-10 DIAGNOSIS — Z87891 Personal history of nicotine dependence: Secondary | ICD-10-CM | POA: Diagnosis not present

## 2022-01-10 DIAGNOSIS — I10 Essential (primary) hypertension: Secondary | ICD-10-CM | POA: Diagnosis not present

## 2022-01-10 DIAGNOSIS — Z7984 Long term (current) use of oral hypoglycemic drugs: Secondary | ICD-10-CM | POA: Diagnosis not present

## 2022-01-10 DIAGNOSIS — Z7902 Long term (current) use of antithrombotics/antiplatelets: Secondary | ICD-10-CM | POA: Diagnosis not present

## 2022-01-10 DIAGNOSIS — Z89512 Acquired absence of left leg below knee: Secondary | ICD-10-CM | POA: Diagnosis not present

## 2022-01-15 DIAGNOSIS — Z89512 Acquired absence of left leg below knee: Secondary | ICD-10-CM | POA: Diagnosis not present

## 2022-01-15 DIAGNOSIS — I739 Peripheral vascular disease, unspecified: Secondary | ICD-10-CM | POA: Diagnosis not present

## 2022-01-15 DIAGNOSIS — J449 Chronic obstructive pulmonary disease, unspecified: Secondary | ICD-10-CM | POA: Diagnosis not present

## 2022-01-15 DIAGNOSIS — N179 Acute kidney failure, unspecified: Secondary | ICD-10-CM | POA: Diagnosis not present

## 2022-01-15 DIAGNOSIS — I5022 Chronic systolic (congestive) heart failure: Secondary | ICD-10-CM | POA: Diagnosis not present

## 2022-01-15 DIAGNOSIS — R531 Weakness: Secondary | ICD-10-CM | POA: Diagnosis not present

## 2022-01-15 DIAGNOSIS — I2510 Atherosclerotic heart disease of native coronary artery without angina pectoris: Secondary | ICD-10-CM | POA: Diagnosis not present

## 2022-01-17 DIAGNOSIS — Z87891 Personal history of nicotine dependence: Secondary | ICD-10-CM | POA: Diagnosis not present

## 2022-01-17 DIAGNOSIS — Z7982 Long term (current) use of aspirin: Secondary | ICD-10-CM | POA: Diagnosis not present

## 2022-01-17 DIAGNOSIS — E1165 Type 2 diabetes mellitus with hyperglycemia: Secondary | ICD-10-CM | POA: Diagnosis not present

## 2022-01-17 DIAGNOSIS — Z7902 Long term (current) use of antithrombotics/antiplatelets: Secondary | ICD-10-CM | POA: Diagnosis not present

## 2022-01-17 DIAGNOSIS — Z8673 Personal history of transient ischemic attack (TIA), and cerebral infarction without residual deficits: Secondary | ICD-10-CM | POA: Diagnosis not present

## 2022-01-17 DIAGNOSIS — Z9181 History of falling: Secondary | ICD-10-CM | POA: Diagnosis not present

## 2022-01-17 DIAGNOSIS — Z794 Long term (current) use of insulin: Secondary | ICD-10-CM | POA: Diagnosis not present

## 2022-01-17 DIAGNOSIS — Z89512 Acquired absence of left leg below knee: Secondary | ICD-10-CM | POA: Diagnosis not present

## 2022-01-17 DIAGNOSIS — Z7984 Long term (current) use of oral hypoglycemic drugs: Secondary | ICD-10-CM | POA: Diagnosis not present

## 2022-01-17 DIAGNOSIS — J432 Centrilobular emphysema: Secondary | ICD-10-CM | POA: Diagnosis not present

## 2022-01-17 DIAGNOSIS — I10 Essential (primary) hypertension: Secondary | ICD-10-CM | POA: Diagnosis not present

## 2022-01-18 ENCOUNTER — Telehealth: Payer: Self-pay | Admitting: Family Medicine

## 2022-01-18 NOTE — Telephone Encounter (Signed)
Kelsey from [facility] called stating needing OT order to be faxed back to [phone] before can be seen. Please advise and Thank you! 

## 2022-01-19 DIAGNOSIS — Z7902 Long term (current) use of antithrombotics/antiplatelets: Secondary | ICD-10-CM | POA: Diagnosis not present

## 2022-01-19 DIAGNOSIS — Z7984 Long term (current) use of oral hypoglycemic drugs: Secondary | ICD-10-CM | POA: Diagnosis not present

## 2022-01-19 DIAGNOSIS — Z87891 Personal history of nicotine dependence: Secondary | ICD-10-CM | POA: Diagnosis not present

## 2022-01-19 DIAGNOSIS — J432 Centrilobular emphysema: Secondary | ICD-10-CM | POA: Diagnosis not present

## 2022-01-19 DIAGNOSIS — Z7982 Long term (current) use of aspirin: Secondary | ICD-10-CM | POA: Diagnosis not present

## 2022-01-19 DIAGNOSIS — I10 Essential (primary) hypertension: Secondary | ICD-10-CM | POA: Diagnosis not present

## 2022-01-19 DIAGNOSIS — E1165 Type 2 diabetes mellitus with hyperglycemia: Secondary | ICD-10-CM | POA: Diagnosis not present

## 2022-01-19 DIAGNOSIS — Z8673 Personal history of transient ischemic attack (TIA), and cerebral infarction without residual deficits: Secondary | ICD-10-CM | POA: Diagnosis not present

## 2022-01-19 DIAGNOSIS — Z9181 History of falling: Secondary | ICD-10-CM | POA: Diagnosis not present

## 2022-01-19 DIAGNOSIS — Z794 Long term (current) use of insulin: Secondary | ICD-10-CM | POA: Diagnosis not present

## 2022-01-19 DIAGNOSIS — Z89512 Acquired absence of left leg below knee: Secondary | ICD-10-CM | POA: Diagnosis not present

## 2022-01-19 NOTE — Telephone Encounter (Signed)
Paperwork for OT ordered found & I have asked Dr. French Ana to sign so I can fax back today. When they are signed I will fax.

## 2022-01-19 NOTE — Telephone Encounter (Signed)
I have faxed order to [facility]

## 2022-01-22 DIAGNOSIS — M6281 Muscle weakness (generalized): Secondary | ICD-10-CM | POA: Diagnosis not present

## 2022-01-22 DIAGNOSIS — I619 Nontraumatic intracerebral hemorrhage, unspecified: Secondary | ICD-10-CM | POA: Diagnosis not present

## 2022-01-22 NOTE — Progress Notes (Deleted)
Date:  01/22/2022   ID:  Danny Organ Sr., DOB 24-Nov-1950, MRN [id number]  Patient Location:  [address] [place] [address]   Provider location:   [facility], [place] office  PCP:  [name], MD  Cardiologist:  [name]   No chief complaint on file.   History of Present Illness:    Danny CAMPANY Sr. is a 71 y.o. male  past medical history of smoke 4 packs a day who stopped earlier in 2011,  coronary artery disease,  cardiac catheterization showing severe two-vessel disease,  severe stenosis of the proximal to mid LAD, severe ostial O1 and O2 disease, moderate to severe mid left circumflex disease that appeared aneurysmal, moderate OM 2 disease ejection fraction 40%  PCI of the LAD on January 11, 2010,  Chronic renal insufficiency,  poorly controlled diabetes on insulin,  hyperlipidemia who presents for routine followup of his coronary artery disease.  Last seen by myself May 2021  In the hospital March 2023 after being found down, confused  CT scan demonstrated intracranial hemorrhage with intraventricular extension and patient was transferred to neuro ICU at [facility] for higher level of care.  Patient on aspirin and Plavix at home.  Discharged on blood pressure medications Carvedilol, Lasix, isosorbide mononitrate, Entresto and amlodipine and spironolactone His aspirin and Plavix were held at discharge  Recent PV procedure April 2023  Percutaneous transluminal angioplasty of right peroneal artery with 3 mm diameter by 15 cm length angioplasty balloon             5.  Mechanical thrombectomy to the right SFA and popliteal artery to debulk chronic thrombus from the occlusion with the Rota Rex device             6.  Percutaneous transluminal angioplasty of the right distal SFA and above-knee popliteal artery with 5 mm diameter by 15 cm length Lutonix drug-coated angioplasty balloon             7.  Viabahn stent placement to  the right popliteal artery with 6 mm diameter by 5 cm length Viabahn stent --Back on aspirin Plavix   covid positive 07/2019, COVID-19 viral pneumonia  hemoglobin A1c 11.2.   pt oxygen saturation went down to 88%, CXR: hazy bilateral peripheral opacities Remdesivir and steroids. Records reviewed  Labs reviewed CR 1.4  Mother with covid, died  Total chol 164, LDL 44 last year 08/2018  Previously seen by Dr. Lucky Cowboy  lower extremity angiography Has not followed up   EKG personally reviewed by myself on todays visit NSR rate 95 bpm, T wave ABn anterolateral leads, I and AVL Same as 10/2017   Prior CV studies:   The following studies were reviewed today:    Past Medical History:  Diagnosis Date   CAD (coronary artery disease)    a. 01/2010 PCI of LAD; b. 09/2014 PCI/DES of mLAD due to ISR. D1 80, D2 80(jailed), LCX 48m; c. 07/2016 NSTEMI/Cath: LM nl, LAD 65m ISR, 40d, D1 90ost, 80p, D2 90ost, RI min irregs, LCX min irregs, OM1 90 small, OM2/3 min irregs, RCA min irregs, RPLB1 90, EF 25-35%.   Chronic combined systolic and diastolic CHF (congestive heart failure) ([place])    a. 07/2016 Echo: EF 30%, severe septal/anterior HK, Gr1 DD.   CKD (chronic kidney disease), stage III (HCC)    COPD (chronic obstructive pulmonary disease) (HCC)    DM2 (diabetes mellitus, type 2) ([place])  Erectile dysfunction    HLD (hyperlipidemia)    HTN (hypertension)    Ischemic cardiomyopathy    a. 07/2016 Echo: EF 30% w/ sev septal/ant HK. Gr1 DD.   Past Surgical History:  Procedure Laterality Date   AMPUTATION Left 11/10/2020   Procedure: AMPUTATION BELOW KNEE;  Surgeon: [name], MD;  Location: [facility] ORS;  Service: General;  Laterality: Left;   BELOW KNEE LEG AMPUTATION     CAD: stent to the LAD     CARDIAC CATHETERIZATION  10/01/2014   CARDIAC CATHETERIZATION N/A 07/23/2016   Procedure: Left Heart Cath and Coronary Angiography;  Surgeon: [name], MD;  Location: [place] CV [facility];   Service: Cardiovascular;  Laterality: N/A;   CORONARY ANGIOPLASTY WITH STENT PLACEMENT  10/01/2014   LOWER EXTREMITY ANGIOGRAPHY Left 10/31/2020   Procedure: LOWER EXTREMITY ANGIOGRAPHY;  Surgeon: [name], MD;  Location: [place] [facility];  Service: Cardiovascular;  Laterality: Left;   LOWER EXTREMITY ANGIOGRAPHY Right 11/20/2021   Procedure: Lower Extremity Angiography;  Surgeon: [name], MD;  Location: [place] [facility];  Service: Cardiovascular;  Laterality: Right;     No outpatient medications have been marked as taking for the 01/23/22 encounter (Appointment) with [name], MD.     Allergies:   Metformin and related   Social History   Tobacco Use   Smoking status: Former    Packs/day: 3.00    Years: 0.00    Total pack years: 0.00    Types: Cigarettes    Quit date: 09/07/2009    Years since quitting: 12.3   Smokeless tobacco: Never   Tobacco comments:    quit june 2011  Vaping Use   Vaping Use: Never used  Substance Use Topics   Alcohol use: Not Currently    Alcohol/week: 7.0 standard drinks of alcohol    Types: 6 Cans of beer, 1 Standard drinks or equivalent per week    Comment: every other weekend   Drug use: Never     Current Outpatient Medications on File Prior to Visit  Medication Sig Dispense Refill   acetaminophen (TYLENOL) 325 MG tablet Take 2 tablets (650 mg total) by mouth every 4 (four) hours as needed for mild pain (or temp > 37.5 C (99.5 F)).     albuterol (PROVENTIL) (2.5 MG/3ML) 0.083% nebulizer solution Take 3 mLs (2.5 mg total) by nebulization every 6 (six) hours as needed for wheezing or shortness of breath. 150 mL 1   Albuterol Sulfate (PROAIR RESPICLICK) 123XX123 (90 Base) MCG/ACT AEPB Inhale 1 puff into the lungs every 6 (six) hours as needed. 1 each 12   aspirin EC 81 MG tablet Take 1 tablet (81 mg total) by mouth daily. 150 tablet 2   atorvastatin (LIPITOR) 80 MG tablet Take 1 tablet (80 mg total) by mouth daily. 90 tablet 3    butalbital-acetaminophen-caffeine (FIORICET) 50-325-40 MG tablet Take 1 tablet by mouth every 8 (eight) hours as needed for headache. 14 tablet 0   carvedilol (COREG) 25 MG tablet Take 1 tablet (25 mg total) by mouth 2 (two) times daily with a meal. 60 tablet 0   clopidogrel (PLAVIX) 75 MG tablet Take 1 tablet (75 mg total) by mouth daily. 30 tablet 11   Continuous Blood Gluc Sensor (FREESTYLE LIBRE 2 SENSOR) MISC USE 1 SENSOR EVERY 14 DAYS 6 each 0   Dulaglutide (TRULICITY) 4.5 0000000 SOPN Inject 4.5 mg as directed once a week. 6 mL 3   empagliflozin (  JARDIANCE) 25 MG TABS tablet Take 1 tablet (25 mg total) by mouth daily. 90 tablet 3   ENTRESTO 49-51 MG TAKE 1 TABLET BY MOUTH 2 (TWO) TIMES DAILY. STOP LISINOPRIL AND POTASSIUM 60 tablet 2   escitalopram (LEXAPRO) 10 MG tablet Take 1 tablet (10 mg total) by mouth daily. 90 tablet 1   fluticasone-salmeterol (ADVAIR) 250-50 MCG/ACT AEPB Inhale 1 puff into the lungs 2 (two) times daily. 3 each 1   furosemide (LASIX) 20 MG tablet TAKE 1 TABLET (20MG ) BY MOUTH ONCE EVERY OTHER DAY. 45 tablet 3   gabapentin (NEURONTIN) 300 MG capsule Take 1 capsule (300 mg total) by mouth at bedtime. 30 capsule 3   insulin aspart (NOVOLOG) 100 UNIT/ML injection Inject 0-15 Units into the skin 4 (four) times daily -  before meals and at bedtime. CBG < 70: Implement Hypoglycemia Standing Orders and refer to Hypoglycemia Standing Orders sidebar report CBG 70 - 120: 0 units CBG 121 - 150: 2 units CBG 151 - 200: 3 units CBG 201 - 250: 5 units CBG 251 - 300: 8 units CBG 301 - 350: 11 units CBG 351 - 400: 15 units CBG > 400: call MD 10 mL 11   isosorbide mononitrate (IMDUR) 30 MG 24 hr tablet TAKE 1 TABLET BY MOUTH EVERY DAY 90 tablet 3   metFORMIN (GLUCOPHAGE-XR) 500 MG 24 hr tablet Take 1 tablet (500 mg total) by mouth 2 (two) times daily with a meal. (Patient taking differently: Take 500 mg by mouth daily with breakfast. Pt reports "tears my stomach up if I take more than once  a day") 180 tablet 1   nitroGLYCERIN (NITROSTAT) 0.4 MG SL tablet Place 0.4 mg under the tongue every 5 (five) minutes as needed for chest pain.     ondansetron (ZOFRAN) 4 MG tablet Take 4 mg by mouth every 6 (six) hours as needed.     spironolactone (ALDACTONE) 25 MG tablet Take 1 tablet (25 mg total) by mouth daily. 90 tablet 1   tamsulosin (FLOMAX) 0.4 MG CAPS capsule Take 1 capsule (0.4 mg total) by mouth daily. 90 capsule 0   No current facility-administered medications on file prior to visit.     Family Hx: The patient's family history includes Cancer in his brother; Heart attack in his father.  ROS:   Please see the history of present illness.    Review of Systems  Constitutional: Negative.   HENT: Negative.    Respiratory:  Positive for shortness of breath.   Cardiovascular: Negative.   Gastrointestinal: Negative.   Musculoskeletal:  Positive for back pain and joint pain.  Neurological: Negative.   Psychiatric/Behavioral: Negative.    All other systems reviewed and are negative.     Labs/Other Tests and Data Reviewed:    Recent Labs: 09/30/2021: B Natriuretic Peptide 271.2 10/10/2021: Magnesium 2.2 12/18/2021: ALT 13; BUN 37; Creatinine, Ser 1.23; Hemoglobin 15.1; Platelets 287.0; Potassium 4.2; Sodium 139   Recent Lipid Panel Lab Results  Component Value Date/Time   CHOL 112 10/03/2021 10:18 AM   TRIG 147 10/03/2021 10:18 AM   HDL 44 10/03/2021 10:18 AM   CHOLHDL 2.5 10/03/2021 10:18 AM   LDLCALC 39 10/03/2021 10:18 AM   LDLDIRECT 64.0 03/20/2018 09:50 AM    Wt Readings from Last 3 Encounters:  12/18/21 236 lb (107 kg)  12/12/21 226 lb (102.5 kg)  11/14/21 226 lb (102.5 kg)     Exam:    Vital Signs: Vital signs may also be detailed in  the HPI There were no vitals taken for this visit. Constitutional:  oriented to person, place, and time. No distress.  HENT:  Head: Grossly normal Eyes:  no discharge. No scleral icterus.  Neck: No JVD, no carotid bruits   Cardiovascular: Regular rate and rhythm, no murmurs appreciated Pulmonary/Chest: Clear to auscultation bilaterally, no wheezes or rails Abdominal: Soft.  no distension.  no tenderness.  Musculoskeletal: Normal range of motion Neurological:  normal muscle tone. Coordination normal. No atrophy Skin: Skin warm and dry Psychiatric: normal affect, pleasant   ASSESSMENT & PLAN:    Chronic combined systolic and diastolic CHF (congestive heart failure) (HCC) Appears relatively euvolemic on today's visit Shortness of breath likely secondary to recovery from Covid and emphysema He is on inhalers No changes to his medications  Coronary artery disease of native artery of native heart with stable angina pectoris (HCC) Stressed importance of diabetes control Poor diet, worsening numbers recently in the setting of Covid and received steroids Continue statin , Zetia, beta-blocker, long-acting nitrate  Centrilobular emphysema (HCC) Stop smoking many years ago, now on inhalers post Covid  Essential hypertension Did not take his medications this morning, long walk stressful getting into the office  Recommend he closely monitor blood pressure at home  Diabetes mellitus type 2, uncontrolled, with complications (HCC) Recommend he go back to his strict diet  CKD (chronic kidney disease) stage 3, GFR 30-59 ml/min (HCC) Stable renal function  Pure hypercholesterolemia Stressed importance of aggressive diabetes control   Total encounter time more than 25 minutes  Greater than 50% was spent in counseling and coordination of care with the patient   Signed, Julien Nordmann, MD  01/22/2022 7:53 AM    [facility] [address], [place], [place] [address]

## 2022-01-23 ENCOUNTER — Telehealth: Payer: Self-pay | Admitting: Family Medicine

## 2022-01-23 ENCOUNTER — Ambulatory Visit: Payer: Medicare Other | Admitting: Cardiovascular Disease

## 2022-01-23 ENCOUNTER — Other Ambulatory Visit: Payer: Self-pay | Admitting: Family Medicine

## 2022-01-23 DIAGNOSIS — I5042 Chronic combined systolic (congestive) and diastolic (congestive) heart failure: Secondary | ICD-10-CM

## 2022-01-23 DIAGNOSIS — N183 Chronic kidney disease, stage 3 unspecified: Secondary | ICD-10-CM

## 2022-01-23 DIAGNOSIS — I255 Ischemic cardiomyopathy: Secondary | ICD-10-CM

## 2022-01-23 DIAGNOSIS — L97909 Non-pressure chronic ulcer of unspecified part of unspecified lower leg with unspecified severity: Secondary | ICD-10-CM

## 2022-01-23 DIAGNOSIS — I25118 Atherosclerotic heart disease of native coronary artery with other forms of angina pectoris: Secondary | ICD-10-CM

## 2022-01-23 DIAGNOSIS — E1169 Type 2 diabetes mellitus with other specified complication: Secondary | ICD-10-CM

## 2022-01-23 DIAGNOSIS — J432 Centrilobular emphysema: Secondary | ICD-10-CM

## 2022-01-23 DIAGNOSIS — I10 Essential (primary) hypertension: Secondary | ICD-10-CM

## 2022-01-23 DIAGNOSIS — I779 Disorder of arteries and arterioles, unspecified: Secondary | ICD-10-CM

## 2022-01-23 DIAGNOSIS — E782 Mixed hyperlipidemia: Secondary | ICD-10-CM

## 2022-01-23 DIAGNOSIS — R0600 Dyspnea, unspecified: Secondary | ICD-10-CM

## 2022-01-23 DIAGNOSIS — I70219 Atherosclerosis of native arteries of extremities with intermittent claudication, unspecified extremity: Secondary | ICD-10-CM

## 2022-01-23 DIAGNOSIS — E785 Hyperlipidemia, unspecified: Secondary | ICD-10-CM

## 2022-01-23 NOTE — Telephone Encounter (Signed)
[facility] rep dropped off plan of care paper work to be filled out by Dr. [name]... Orders are place in colored folder upfront... Rep is requesting paperwork to be fax back to them

## 2022-01-24 ENCOUNTER — Telehealth (INDEPENDENT_AMBULATORY_CARE_PROVIDER_SITE_OTHER): Payer: Self-pay | Admitting: Vascular Surgery

## 2022-01-24 ENCOUNTER — Encounter: Payer: Self-pay | Admitting: Cardiovascular Disease

## 2022-01-24 DIAGNOSIS — Z8673 Personal history of transient ischemic attack (TIA), and cerebral infarction without residual deficits: Secondary | ICD-10-CM | POA: Diagnosis not present

## 2022-01-24 DIAGNOSIS — E1165 Type 2 diabetes mellitus with hyperglycemia: Secondary | ICD-10-CM | POA: Diagnosis not present

## 2022-01-24 DIAGNOSIS — Z794 Long term (current) use of insulin: Secondary | ICD-10-CM | POA: Diagnosis not present

## 2022-01-24 DIAGNOSIS — Z7902 Long term (current) use of antithrombotics/antiplatelets: Secondary | ICD-10-CM | POA: Diagnosis not present

## 2022-01-24 DIAGNOSIS — I10 Essential (primary) hypertension: Secondary | ICD-10-CM | POA: Diagnosis not present

## 2022-01-24 DIAGNOSIS — Z7982 Long term (current) use of aspirin: Secondary | ICD-10-CM | POA: Diagnosis not present

## 2022-01-24 DIAGNOSIS — Z87891 Personal history of nicotine dependence: Secondary | ICD-10-CM | POA: Diagnosis not present

## 2022-01-24 DIAGNOSIS — J432 Centrilobular emphysema: Secondary | ICD-10-CM | POA: Diagnosis not present

## 2022-01-24 DIAGNOSIS — Z7984 Long term (current) use of oral hypoglycemic drugs: Secondary | ICD-10-CM | POA: Diagnosis not present

## 2022-01-24 DIAGNOSIS — Z9181 History of falling: Secondary | ICD-10-CM | POA: Diagnosis not present

## 2022-01-24 DIAGNOSIS — Z89512 Acquired absence of left leg below knee: Secondary | ICD-10-CM | POA: Diagnosis not present

## 2022-01-24 NOTE — Telephone Encounter (Signed)
Signed.

## 2022-01-24 NOTE — Telephone Encounter (Signed)
Patients son called in stating that rehab lost the patients sock for his amputated leg. And he is needing to be refitted. They reached out to [facility] and they have no issues doing so but they need a new order sent in. Patient is wanting to know does he need to be in office in order to get new orders sent in    Please call and advise

## 2022-01-24 NOTE — Telephone Encounter (Signed)
Order faxed and confirmation given.  Jermaine Neuharth,cma

## 2022-01-25 NOTE — Telephone Encounter (Signed)
I contacted the patient and let him know to have the [facility] fax over an order for the item he needs and we will get it signed and faxed back.

## 2022-01-25 NOTE — Telephone Encounter (Signed)
He doesn't need to be seen, we just need to know the exact name of the item they need the Rx for

## 2022-01-26 ENCOUNTER — Ambulatory Visit: Payer: Medicare Other | Admitting: Family Medicine

## 2022-01-26 DIAGNOSIS — I10 Essential (primary) hypertension: Secondary | ICD-10-CM | POA: Diagnosis not present

## 2022-01-26 DIAGNOSIS — Z794 Long term (current) use of insulin: Secondary | ICD-10-CM | POA: Diagnosis not present

## 2022-01-26 DIAGNOSIS — Z7982 Long term (current) use of aspirin: Secondary | ICD-10-CM | POA: Diagnosis not present

## 2022-01-26 DIAGNOSIS — J432 Centrilobular emphysema: Secondary | ICD-10-CM | POA: Diagnosis not present

## 2022-01-26 DIAGNOSIS — Z89512 Acquired absence of left leg below knee: Secondary | ICD-10-CM | POA: Diagnosis not present

## 2022-01-26 DIAGNOSIS — E1165 Type 2 diabetes mellitus with hyperglycemia: Secondary | ICD-10-CM | POA: Diagnosis not present

## 2022-01-26 DIAGNOSIS — Z7902 Long term (current) use of antithrombotics/antiplatelets: Secondary | ICD-10-CM | POA: Diagnosis not present

## 2022-01-26 DIAGNOSIS — Z7984 Long term (current) use of oral hypoglycemic drugs: Secondary | ICD-10-CM | POA: Diagnosis not present

## 2022-01-26 DIAGNOSIS — Z87891 Personal history of nicotine dependence: Secondary | ICD-10-CM | POA: Diagnosis not present

## 2022-01-26 DIAGNOSIS — Z9181 History of falling: Secondary | ICD-10-CM | POA: Diagnosis not present

## 2022-01-26 DIAGNOSIS — Z8673 Personal history of transient ischemic attack (TIA), and cerebral infarction without residual deficits: Secondary | ICD-10-CM | POA: Diagnosis not present

## 2022-01-30 ENCOUNTER — Telehealth: Payer: Self-pay

## 2022-01-30 ENCOUNTER — Other Ambulatory Visit: Payer: Self-pay

## 2022-01-30 DIAGNOSIS — Z7982 Long term (current) use of aspirin: Secondary | ICD-10-CM | POA: Diagnosis not present

## 2022-01-30 DIAGNOSIS — J432 Centrilobular emphysema: Secondary | ICD-10-CM | POA: Diagnosis not present

## 2022-01-30 DIAGNOSIS — Z9181 History of falling: Secondary | ICD-10-CM | POA: Diagnosis not present

## 2022-01-30 DIAGNOSIS — Z794 Long term (current) use of insulin: Secondary | ICD-10-CM | POA: Diagnosis not present

## 2022-01-30 DIAGNOSIS — E1165 Type 2 diabetes mellitus with hyperglycemia: Secondary | ICD-10-CM | POA: Diagnosis not present

## 2022-01-30 DIAGNOSIS — Z89512 Acquired absence of left leg below knee: Secondary | ICD-10-CM | POA: Diagnosis not present

## 2022-01-30 DIAGNOSIS — I10 Essential (primary) hypertension: Secondary | ICD-10-CM | POA: Diagnosis not present

## 2022-01-30 DIAGNOSIS — Z87891 Personal history of nicotine dependence: Secondary | ICD-10-CM | POA: Diagnosis not present

## 2022-01-30 DIAGNOSIS — I5032 Chronic diastolic (congestive) heart failure: Secondary | ICD-10-CM

## 2022-01-30 DIAGNOSIS — Z7902 Long term (current) use of antithrombotics/antiplatelets: Secondary | ICD-10-CM | POA: Diagnosis not present

## 2022-01-30 DIAGNOSIS — Z7984 Long term (current) use of oral hypoglycemic drugs: Secondary | ICD-10-CM | POA: Diagnosis not present

## 2022-01-30 DIAGNOSIS — Z8673 Personal history of transient ischemic attack (TIA), and cerebral infarction without residual deficits: Secondary | ICD-10-CM | POA: Diagnosis not present

## 2022-01-30 MED ORDER — ENTRESTO 49-51 MG PO TABS: ORAL_TABLET | ORAL | 1 refills | Status: DC

## 2022-01-30 MED ORDER — TAMSULOSIN HCL 0.4 MG PO CAPS: 0.4000 mg | ORAL_CAPSULE | Freq: Every day | ORAL | 1 refills | Status: DC

## 2022-01-30 MED ORDER — SPIRONOLACTONE 25 MG PO TABS: 25.0000 mg | ORAL_TABLET | Freq: Every day | ORAL | 1 refills | Status: DC

## 2022-01-31 DIAGNOSIS — Z89512 Acquired absence of left leg below knee: Secondary | ICD-10-CM | POA: Diagnosis not present

## 2022-01-31 DIAGNOSIS — I10 Essential (primary) hypertension: Secondary | ICD-10-CM | POA: Diagnosis not present

## 2022-01-31 DIAGNOSIS — E1165 Type 2 diabetes mellitus with hyperglycemia: Secondary | ICD-10-CM | POA: Diagnosis not present

## 2022-01-31 DIAGNOSIS — Z9181 History of falling: Secondary | ICD-10-CM | POA: Diagnosis not present

## 2022-01-31 DIAGNOSIS — Z8673 Personal history of transient ischemic attack (TIA), and cerebral infarction without residual deficits: Secondary | ICD-10-CM | POA: Diagnosis not present

## 2022-01-31 DIAGNOSIS — J432 Centrilobular emphysema: Secondary | ICD-10-CM | POA: Diagnosis not present

## 2022-01-31 DIAGNOSIS — Z7982 Long term (current) use of aspirin: Secondary | ICD-10-CM | POA: Diagnosis not present

## 2022-01-31 DIAGNOSIS — Z7902 Long term (current) use of antithrombotics/antiplatelets: Secondary | ICD-10-CM | POA: Diagnosis not present

## 2022-01-31 DIAGNOSIS — Z87891 Personal history of nicotine dependence: Secondary | ICD-10-CM | POA: Diagnosis not present

## 2022-01-31 DIAGNOSIS — Z7984 Long term (current) use of oral hypoglycemic drugs: Secondary | ICD-10-CM | POA: Diagnosis not present

## 2022-01-31 DIAGNOSIS — Z794 Long term (current) use of insulin: Secondary | ICD-10-CM | POA: Diagnosis not present

## 2022-01-31 NOTE — Telephone Encounter (Signed)
I do not do sock fittings. He may need to go to the surgeon for this or to whoever fitted him for his prosthesis.

## 2022-01-31 NOTE — Telephone Encounter (Signed)
I called nad spoke with the patient and informed him that Dr. [name] does not do the fitting for the prothesis socks he would need to go to the place that did the prothesis and he understood. Farah Lepak,cma

## 2022-02-01 DIAGNOSIS — Z9181 History of falling: Secondary | ICD-10-CM | POA: Diagnosis not present

## 2022-02-01 DIAGNOSIS — Z7982 Long term (current) use of aspirin: Secondary | ICD-10-CM | POA: Diagnosis not present

## 2022-02-01 DIAGNOSIS — Z7902 Long term (current) use of antithrombotics/antiplatelets: Secondary | ICD-10-CM | POA: Diagnosis not present

## 2022-02-01 DIAGNOSIS — I10 Essential (primary) hypertension: Secondary | ICD-10-CM | POA: Diagnosis not present

## 2022-02-01 DIAGNOSIS — J432 Centrilobular emphysema: Secondary | ICD-10-CM | POA: Diagnosis not present

## 2022-02-01 DIAGNOSIS — Z8673 Personal history of transient ischemic attack (TIA), and cerebral infarction without residual deficits: Secondary | ICD-10-CM | POA: Diagnosis not present

## 2022-02-01 DIAGNOSIS — E1165 Type 2 diabetes mellitus with hyperglycemia: Secondary | ICD-10-CM | POA: Diagnosis not present

## 2022-02-01 DIAGNOSIS — Z89512 Acquired absence of left leg below knee: Secondary | ICD-10-CM | POA: Diagnosis not present

## 2022-02-01 DIAGNOSIS — Z7984 Long term (current) use of oral hypoglycemic drugs: Secondary | ICD-10-CM | POA: Diagnosis not present

## 2022-02-01 DIAGNOSIS — Z794 Long term (current) use of insulin: Secondary | ICD-10-CM | POA: Diagnosis not present

## 2022-02-01 DIAGNOSIS — Z87891 Personal history of nicotine dependence: Secondary | ICD-10-CM | POA: Diagnosis not present

## 2022-02-05 DIAGNOSIS — I10 Essential (primary) hypertension: Secondary | ICD-10-CM | POA: Diagnosis not present

## 2022-02-05 DIAGNOSIS — Z8673 Personal history of transient ischemic attack (TIA), and cerebral infarction without residual deficits: Secondary | ICD-10-CM | POA: Diagnosis not present

## 2022-02-05 DIAGNOSIS — Z7902 Long term (current) use of antithrombotics/antiplatelets: Secondary | ICD-10-CM | POA: Diagnosis not present

## 2022-02-05 DIAGNOSIS — Z7982 Long term (current) use of aspirin: Secondary | ICD-10-CM | POA: Diagnosis not present

## 2022-02-05 DIAGNOSIS — E1165 Type 2 diabetes mellitus with hyperglycemia: Secondary | ICD-10-CM | POA: Diagnosis not present

## 2022-02-05 DIAGNOSIS — Z89512 Acquired absence of left leg below knee: Secondary | ICD-10-CM | POA: Diagnosis not present

## 2022-02-05 DIAGNOSIS — Z794 Long term (current) use of insulin: Secondary | ICD-10-CM | POA: Diagnosis not present

## 2022-02-05 DIAGNOSIS — Z7984 Long term (current) use of oral hypoglycemic drugs: Secondary | ICD-10-CM | POA: Diagnosis not present

## 2022-02-05 DIAGNOSIS — J432 Centrilobular emphysema: Secondary | ICD-10-CM | POA: Diagnosis not present

## 2022-02-05 DIAGNOSIS — Z87891 Personal history of nicotine dependence: Secondary | ICD-10-CM | POA: Diagnosis not present

## 2022-02-05 DIAGNOSIS — Z9181 History of falling: Secondary | ICD-10-CM | POA: Diagnosis not present

## 2022-02-07 ENCOUNTER — Emergency Department: Payer: Medicare Other

## 2022-02-07 ENCOUNTER — Other Ambulatory Visit: Payer: Self-pay

## 2022-02-07 ENCOUNTER — Emergency Department
Admission: EM | Admit: 2022-02-07 | Discharge: 2022-02-08 | Disposition: A | Discharge status: Home / Self Care | Payer: Medicare Other | Attending: Emergency Medicine | Admitting: Emergency Medicine

## 2022-02-07 ENCOUNTER — Encounter: Payer: Self-pay | Admitting: *Deleted

## 2022-02-07 DIAGNOSIS — S0990XA Unspecified injury of head, initial encounter: Secondary | ICD-10-CM | POA: Diagnosis not present

## 2022-02-07 DIAGNOSIS — Z043 Encounter for examination and observation following other accident: Secondary | ICD-10-CM | POA: Diagnosis not present

## 2022-02-07 DIAGNOSIS — R0781 Pleurodynia: Secondary | ICD-10-CM | POA: Diagnosis not present

## 2022-02-07 DIAGNOSIS — W19XXXA Unspecified fall, initial encounter: Secondary | ICD-10-CM | POA: Diagnosis not present

## 2022-02-07 DIAGNOSIS — J9811 Atelectasis: Secondary | ICD-10-CM | POA: Diagnosis not present

## 2022-02-07 DIAGNOSIS — I700 Atherosclerosis of aorta: Secondary | ICD-10-CM | POA: Diagnosis not present

## 2022-02-07 DIAGNOSIS — S20211A Contusion of right front wall of thorax, initial encounter: Secondary | ICD-10-CM | POA: Insufficient documentation

## 2022-02-07 DIAGNOSIS — R6889 Other general symptoms and signs: Secondary | ICD-10-CM | POA: Diagnosis not present

## 2022-02-07 DIAGNOSIS — R930 Abnormal findings on diagnostic imaging of skull and head, not elsewhere classified: Secondary | ICD-10-CM | POA: Diagnosis not present

## 2022-02-07 DIAGNOSIS — S3993XA Unspecified injury of pelvis, initial encounter: Secondary | ICD-10-CM | POA: Diagnosis not present

## 2022-02-07 DIAGNOSIS — R300 Dysuria: Secondary | ICD-10-CM | POA: Diagnosis not present

## 2022-02-07 DIAGNOSIS — R109 Unspecified abdominal pain: Secondary | ICD-10-CM | POA: Diagnosis not present

## 2022-02-07 DIAGNOSIS — E860 Dehydration: Secondary | ICD-10-CM | POA: Insufficient documentation

## 2022-02-07 DIAGNOSIS — S299XXA Unspecified injury of thorax, initial encounter: Secondary | ICD-10-CM | POA: Diagnosis present

## 2022-02-07 DIAGNOSIS — J439 Emphysema, unspecified: Secondary | ICD-10-CM | POA: Diagnosis not present

## 2022-02-07 DIAGNOSIS — I10 Essential (primary) hypertension: Secondary | ICD-10-CM | POA: Diagnosis not present

## 2022-02-07 DIAGNOSIS — S3991XA Unspecified injury of abdomen, initial encounter: Secondary | ICD-10-CM | POA: Diagnosis not present

## 2022-02-07 DIAGNOSIS — W06XXXA Fall from bed, initial encounter: Secondary | ICD-10-CM | POA: Diagnosis not present

## 2022-02-07 DIAGNOSIS — R0902 Hypoxemia: Secondary | ICD-10-CM | POA: Diagnosis not present

## 2022-02-07 DIAGNOSIS — N179 Acute kidney failure, unspecified: Secondary | ICD-10-CM | POA: Diagnosis not present

## 2022-02-07 DIAGNOSIS — R609 Edema, unspecified: Secondary | ICD-10-CM | POA: Diagnosis not present

## 2022-02-07 DIAGNOSIS — Z743 Need for continuous supervision: Secondary | ICD-10-CM | POA: Diagnosis not present

## 2022-02-07 LAB — BASIC METABOLIC PANEL
Anion gap: 12 (ref 5–15)
BUN: 44 mg/dL — ABNORMAL HIGH (ref 8–23)
CO2: 25 mmol/L (ref 22–32)
Calcium: 9.2 mg/dL (ref 8.9–10.3)
Chloride: 99 mmol/L (ref 98–111)
Creatinine, Ser: 1.66 mg/dL — ABNORMAL HIGH (ref 0.61–1.24)
GFR, Estimated: 44 mL/min — ABNORMAL LOW (ref >60)
Glucose, Bld: 133 mg/dL — ABNORMAL HIGH (ref 70–99)
Potassium: 4 mmol/L (ref 3.5–5.1)
Sodium: 136 mmol/L (ref 135–145)

## 2022-02-07 LAB — CBC
HCT: 49.1 % (ref 39.0–52.0)
Hemoglobin: 15.9 g/dL (ref 13.0–17.0)
MCH: 30.8 pg (ref 26.0–34.0)
MCHC: 32.4 g/dL (ref 30.0–36.0)
MCV: 95.2 fL (ref 80.0–100.0)
Platelets: 268 10*3/uL (ref 150–400)
RBC: 5.16 MIL/uL (ref 4.22–5.81)
RDW: 13 % (ref 11.5–15.5)
WBC: 7.8 10*3/uL (ref 4.0–10.5)
nRBC: 0 % (ref 0.0–0.2)

## 2022-02-07 LAB — TROPONIN I (HIGH SENSITIVITY): Troponin I (High Sensitivity): 18 ng/L — ABNORMAL HIGH (ref <18)

## 2022-02-07 LAB — LIPASE, BLOOD: Lipase: 32 U/L (ref 11–51)

## 2022-02-07 MED ORDER — IOHEXOL 300 MG/ML  SOLN
75.0000 mL | Freq: Once | INTRAMUSCULAR | Status: AC | PRN
  Administered 2022-02-07: 75 mL via INTRAVENOUS

## 2022-02-07 MED ORDER — LACTATED RINGERS IV BOLUS (SEPSIS)
1000.0000 mL | Freq: Once | INTRAVENOUS | Status: AC
  Administered 2022-02-08: 1000 mL via INTRAVENOUS

## 2022-02-07 NOTE — ED Triage Notes (Signed)
EMS brings pt in from home; reports fall 2 days ago hitting head & ribs; today another fall

## 2022-02-07 NOTE — ED Triage Notes (Signed)
Pt brought in via ems from home with a fall.  Pt fell out of bed 2 days ago onto the floor.  Pt has right anterior rib pain . No bruising.  Pt denies neck / back pain.  Pt alert

## 2022-02-07 NOTE — ED Provider Notes (Signed)
[facility] Provider Note    Event Date/Time   First MD Initiated Contact with Patient 02/07/22 2207     (approximate)   History   Fall  Level 5 caveat: Patient is a vague historian so history is limited.  HPI  Danny Kerlin Demmon Sr. is a 71 y.o. male with an extensive chronic medical history that includes but is not limited to CHF, COPD, chronic kidney disease, hypertension, diabetes and diabetic neuropathy, etc.  He presents for evaluation of pain after a fall.  He reports that 2 days ago he eased himself out of bed and onto the floor because he knew he was going to fall.  At that point he developed some pain in his right anterior rib.  He may or may not have had another fall today where he hit his head.  He cannot remember if it was the same event or a different event.  His main focus tonight is the pain in the right anterior lateral rib cage.  Taking a deep breath and moving around hurts and nothing particular makes it better.  He also states that he has had nausea but has not been vomiting.  He has had less to eat and drink over the last 2 days than usual because of the pain.  He denies headache, neck pain, chest pain, shortness of breath.  He said he has had "a little bit" of dysuria.     Physical Exam   Triage Vital Signs: ED Triage Vitals  Enc Vitals Group     BP 02/07/22 1956 122/89     Pulse Rate 02/07/22 1956 (!) 113     Resp 02/07/22 1956 (!) 22     Temp 02/07/22 1956 97.7 F (36.5 C)     Temp Source 02/07/22 1956 Oral     SpO2 02/07/22 1922 95 %     Weight 02/07/22 2001 90.7 kg (200 lb)     Height 02/07/22 2001 1.829 m (6')     Head Circumference --      Peak Flow --      Pain Score 02/07/22 2000 5     Pain Loc --      Pain Edu? --      Excl. in GC? --     Most recent vital signs: Vitals:   02/07/22 1922 02/07/22 1956  BP:  122/89  Pulse:  (!) 113  Resp:  (!) 22  Temp:  97.7 F (36.5 C)  SpO2: 95% 96%     General: Awake, no  distress.  Appears uncomfortable when he moves around. CV:  Good peripheral perfusion.  Normal heart sounds. Resp:  Normal effort.  Lungs are clear to auscultation bilaterally.  Patient has a little bit of tachypnea but that is mostly when he moves around and is feeling the pain. Abd:  No distention.  Tender to palpation of the right side of his abdomen, not localized, states that it makes his ribs hurt more when I push on his abdomen. Other:  No obvious musculoskeletal injuries to his extremities.   ED Results / Procedures / Treatments   Labs (all labs ordered are listed, but only abnormal results are displayed) Labs Reviewed  BASIC METABOLIC PANEL - Abnormal; Notable for the following components:      Result Value   Glucose, Bld 133 (*)    BUN 44 (*)    Creatinine, Ser 1.66 (*)    GFR, Estimated 44 (*)    All other  components within normal limits  LACTIC ACID, PLASMA - Abnormal; Notable for the following components:   Lactic Acid, Venous 2.0 (*)    All other components within normal limits  TROPONIN I (HIGH SENSITIVITY) - Abnormal; Notable for the following components:   Troponin I (High Sensitivity) 18 (*)    All other components within normal limits  CULTURE, BLOOD (SINGLE)  CBC  LIPASE, BLOOD  LACTIC ACID, PLASMA  URINALYSIS, COMPLETE (UACMP) WITH MICROSCOPIC  PROCALCITONIN     EKG  pending at time of transfer of care   RADIOLOGY I viewed and interpreted the patient's head and C-spine CTs, as well as his two-view chest x-ray and his CT of the chest/abdomen/pelvis.  I did not identify any acute abnormalities on his chest x-ray including no rib fracture.  His CT head and cervical spine showed no sign of acute traumatic injury.  CT of the chest/abdomen/pelvis showed no sign of infection in his lungs and no sign of acute intra-abdominal infection.  The radiology reports also showed no sign of any acute injury or infection on any of the studies.   PROCEDURES:  Critical  Care performed: No  Procedures   MEDICATIONS ORDERED IN ED: Medications  lactated ringers bolus 1,000 mL (has no administration in time range)  ondansetron (ZOFRAN) injection 4 mg (has no administration in time range)  acetaminophen (TYLENOL) tablet 1,000 mg (has no administration in time range)  iohexol (OMNIPAQUE) 300 MG/ML solution 75 mL (75 mLs Intravenous Contrast Given 02/07/22 2339)     IMPRESSION / MDM / ASSESSMENT AND PLAN / ED COURSE  I reviewed the triage vital signs and the nursing notes.                              Differential diagnosis includes, but is not limited to, contusion, fracture, solid organ injury (liver), acute infectious process, sepsis.  Patient's presentation is most consistent with acute presentation with potential threat to life or bodily function.  Patient is reporting pain in his right rib cage and it seems musculoskeletal and reproducible.  However he has tenderness palpation of his right abdomen as well with rebound and guarding.  It is unclear exactly how much he fell and what he might of hit.  His vital signs are notable for some mild tachycardia which I think is attributable to volume depletion, but he is also a little bit tachypneic, which again could be due to pain or possibly to infection.    As documented above, two-view chest x-ray was normal.  I ordered EKG, lactic acid, high-sensitivity troponin, basic metabolic panel, CBC, lipase, blood culture, urinalysis.  I also ordered CT head, C-spine, and chest/abdomen/pelvis to look for signs of acute injury and/or acute infection.  Patient agrees with the plan.  I ordered LR 1 L IV bolus, Tylenol 1000 mg by mouth, and Zofran 4 mg IV.  The patient is on the cardiac monitor to evaluate for evidence of arrhythmia and/or significant heart rate changes.  (Delayed documentation) Labs are notable for creatinine of 1.66 suggestive of acute kidney injury and volume depletion which is consistent with his  clinical presentation.  High-sensitivity troponin is very slightly elevated at 18 but he is having no chest pain other than reproducible chest wall pain.  Lactic acid is 2.0, which again could be representative of sepsis but more likely is due to his volume depletion.  We will reassess after IV fluids. Clinical Course as of  02/08/22 0031  Thu Feb 08, 2022  0028 I went over the results with the patient and he was encouraged that he does not have any fractures.  I offered to admit him to the hospital for his acute injury, rehydration, and his pain, but he wants to go home.  I asked him multiple times if he would stay and he prefers not to.  We agreed that we would continue to treat with fluids and nausea medicine and the Tylenol and that we still need to check his urine.  A repeat lactic acid and high-sensitivity troponin should also be checked.  He agrees but still wants to go home if possible.  Transferring ED care to Dr. Dolores Frame to follow-up labs and reassess. [CF]    Clinical Course User Index [CF] Loleta Rose, MD     FINAL CLINICAL IMPRESSION(S) / ED DIAGNOSES   Final diagnoses:  Fall, initial encounter  Dehydration  Rib contusion, right, initial encounter  Acute kidney injury (HCC)     Rx / DC Orders   ED Discharge Orders          Ordered    ondansetron (ZOFRAN-ODT) 4 MG disintegrating tablet        02/08/22 0027             Note:  This document was prepared using Dragon voice recognition software and may include unintentional dictation errors.   Loleta Rose, MD 02/08/22 [phone]

## 2022-02-08 ENCOUNTER — Telehealth: Payer: Self-pay | Admitting: Family Medicine

## 2022-02-08 ENCOUNTER — Other Ambulatory Visit: Payer: Self-pay

## 2022-02-08 DIAGNOSIS — S20211A Contusion of right front wall of thorax, initial encounter: Secondary | ICD-10-CM | POA: Diagnosis not present

## 2022-02-08 LAB — LACTIC ACID, PLASMA
Lactic Acid, Venous: 1.1 mmol/L (ref 0.5–1.9)
Lactic Acid, Venous: 2 mmol/L (ref 0.5–1.9)

## 2022-02-08 LAB — URINALYSIS, COMPLETE (UACMP) WITH MICROSCOPIC
Bacteria, UA: NONE SEEN
Bilirubin Urine: NEGATIVE
Glucose, UA: >=500 mg/dL — AB
Hgb urine dipstick: NEGATIVE
Ketones, ur: 5 mg/dL — AB
Leukocytes,Ua: NEGATIVE
Nitrite: NEGATIVE
Protein, ur: NEGATIVE mg/dL
Specific Gravity, Urine: 1.027 (ref 1.005–1.030)
Squamous Epithelial / HPF: NONE SEEN (ref 0–5)
pH: 5 (ref 5.0–8.0)

## 2022-02-08 LAB — PROCALCITONIN: Procalcitonin: 0.1 ng/mL

## 2022-02-08 LAB — TROPONIN I (HIGH SENSITIVITY): Troponin I (High Sensitivity): 17 ng/L (ref <18)

## 2022-02-08 MED ORDER — ACETAMINOPHEN 500 MG PO TABS
1000.0000 mg | ORAL_TABLET | Freq: Once | ORAL | Status: AC
  Administered 2022-02-08: 1000 mg via ORAL
  Filled 2022-02-08: qty 2

## 2022-02-08 MED ORDER — MORPHINE SULFATE (PF) 4 MG/ML IV SOLN: 4.0000 mg | Freq: Once | INTRAVENOUS | Status: DC

## 2022-02-08 MED ORDER — ONDANSETRON HCL 4 MG/2ML IJ SOLN
4.0000 mg | INTRAMUSCULAR | Status: AC
  Administered 2022-02-08: 4 mg via INTRAVENOUS
  Filled 2022-02-08: qty 2

## 2022-02-08 MED ORDER — ONDANSETRON 4 MG PO TBDP: ORAL_TABLET | ORAL | 0 refills | Status: DC

## 2022-02-08 NOTE — ED Provider Notes (Signed)
-----------------------------------------   3:04 AM on 02/08/2022 -----------------------------------------   UA unremarkable.  Lactic acid trended down to normal.  Repeat troponin remains negative.  Procalcitonin negative.  Patient states he feels fine and is eager for discharge home.  Strict return precautions given.  Patient verbalizes understanding agrees with plan of care.  ED ECG REPORT I, Mry Lamia J, the attending physician, personally viewed and interpreted this ECG.   Date: 02/08/2022  EKG Time: 0045  Rate: 91  Rhythm: normal sinus rhythm  Axis: Normal  Intervals:none  ST&T Change: Nonspecific    Irean Hong, MD 02/08/22 [phone]

## 2022-02-08 NOTE — Discharge Instructions (Addendum)
As we discussed, you did not sustain any severe injury from your fall(s).  It appears that you are a little bit dehydrated.  We gave you IV fluids and encourage you to drink plenty of fluids over the next few days.  If you continue to feel nauseated, please use the prescribed nausea medicine.  Take all of your regular medications as well.  Follow-up with your primary care doctor at the next available opportunity.  Return to the emergency department if you develop new or worsening symptoms that concern you.

## 2022-02-08 NOTE — Telephone Encounter (Signed)
Noted  

## 2022-02-08 NOTE — Telephone Encounter (Signed)
Dawn from [facility] home health called stating pt son stated that pt fell last night between his wheelchair and they took him to the ER. Pt has no injuries

## 2022-02-09 ENCOUNTER — Telehealth: Payer: Self-pay

## 2022-02-09 NOTE — Telephone Encounter (Signed)
Thomasenia Sales called from [facility] to state she would like to let Dr. Marikay Alar know that she will miss her visit with patient today.  Thomasenia Sales states she is following up on an ED visit patient had due to a fall.  Thomasenia Sales states she is at the patient's home, but he is not there.

## 2022-02-09 NOTE — Telephone Encounter (Signed)
Noted  

## 2022-02-12 LAB — CULTURE, BLOOD (SINGLE)
Culture: NO GROWTH
Special Requests: ADEQUATE

## 2022-02-13 DIAGNOSIS — Z89512 Acquired absence of left leg below knee: Secondary | ICD-10-CM | POA: Diagnosis not present

## 2022-02-13 DIAGNOSIS — Z7984 Long term (current) use of oral hypoglycemic drugs: Secondary | ICD-10-CM | POA: Diagnosis not present

## 2022-02-13 DIAGNOSIS — Z8673 Personal history of transient ischemic attack (TIA), and cerebral infarction without residual deficits: Secondary | ICD-10-CM | POA: Diagnosis not present

## 2022-02-13 DIAGNOSIS — Z9181 History of falling: Secondary | ICD-10-CM | POA: Diagnosis not present

## 2022-02-13 DIAGNOSIS — Z7982 Long term (current) use of aspirin: Secondary | ICD-10-CM | POA: Diagnosis not present

## 2022-02-13 DIAGNOSIS — J432 Centrilobular emphysema: Secondary | ICD-10-CM | POA: Diagnosis not present

## 2022-02-13 DIAGNOSIS — Z7902 Long term (current) use of antithrombotics/antiplatelets: Secondary | ICD-10-CM | POA: Diagnosis not present

## 2022-02-13 DIAGNOSIS — Z87891 Personal history of nicotine dependence: Secondary | ICD-10-CM | POA: Diagnosis not present

## 2022-02-13 DIAGNOSIS — Z794 Long term (current) use of insulin: Secondary | ICD-10-CM | POA: Diagnosis not present

## 2022-02-13 DIAGNOSIS — E1165 Type 2 diabetes mellitus with hyperglycemia: Secondary | ICD-10-CM | POA: Diagnosis not present

## 2022-02-13 DIAGNOSIS — I10 Essential (primary) hypertension: Secondary | ICD-10-CM | POA: Diagnosis not present

## 2022-02-14 DIAGNOSIS — I2510 Atherosclerotic heart disease of native coronary artery without angina pectoris: Secondary | ICD-10-CM | POA: Diagnosis not present

## 2022-02-14 DIAGNOSIS — I739 Peripheral vascular disease, unspecified: Secondary | ICD-10-CM | POA: Diagnosis not present

## 2022-02-14 DIAGNOSIS — R531 Weakness: Secondary | ICD-10-CM | POA: Diagnosis not present

## 2022-02-14 DIAGNOSIS — I5022 Chronic systolic (congestive) heart failure: Secondary | ICD-10-CM | POA: Diagnosis not present

## 2022-02-14 DIAGNOSIS — Z89512 Acquired absence of left leg below knee: Secondary | ICD-10-CM | POA: Diagnosis not present

## 2022-02-14 DIAGNOSIS — N179 Acute kidney failure, unspecified: Secondary | ICD-10-CM | POA: Diagnosis not present

## 2022-02-15 DIAGNOSIS — I10 Essential (primary) hypertension: Secondary | ICD-10-CM | POA: Diagnosis not present

## 2022-02-15 DIAGNOSIS — E1165 Type 2 diabetes mellitus with hyperglycemia: Secondary | ICD-10-CM | POA: Diagnosis not present

## 2022-02-15 DIAGNOSIS — Z89512 Acquired absence of left leg below knee: Secondary | ICD-10-CM | POA: Diagnosis not present

## 2022-02-15 DIAGNOSIS — Z9181 History of falling: Secondary | ICD-10-CM | POA: Diagnosis not present

## 2022-02-15 DIAGNOSIS — Z7902 Long term (current) use of antithrombotics/antiplatelets: Secondary | ICD-10-CM | POA: Diagnosis not present

## 2022-02-15 DIAGNOSIS — Z7984 Long term (current) use of oral hypoglycemic drugs: Secondary | ICD-10-CM | POA: Diagnosis not present

## 2022-02-15 DIAGNOSIS — J432 Centrilobular emphysema: Secondary | ICD-10-CM | POA: Diagnosis not present

## 2022-02-15 DIAGNOSIS — Z8673 Personal history of transient ischemic attack (TIA), and cerebral infarction without residual deficits: Secondary | ICD-10-CM | POA: Diagnosis not present

## 2022-02-15 DIAGNOSIS — Z87891 Personal history of nicotine dependence: Secondary | ICD-10-CM | POA: Diagnosis not present

## 2022-02-15 DIAGNOSIS — Z794 Long term (current) use of insulin: Secondary | ICD-10-CM | POA: Diagnosis not present

## 2022-02-15 DIAGNOSIS — Z7982 Long term (current) use of aspirin: Secondary | ICD-10-CM | POA: Diagnosis not present

## 2022-02-15 NOTE — Telephone Encounter (Signed)
Ricka Burdock from [facility] called stating pt BP was 142/80 all other vitals are normal. Vonnie stated the pt is a really nice man but he is non-compliant in doing the exercises. His son wanted to bring him in because he could have a UTI and he is not concerned about his urination because he urinates on hisself anyway. Vonnie states pt is weak, tired and does not seem like hisself Vonnie [phone] Son [phone]

## 2022-02-15 NOTE — Telephone Encounter (Signed)
I  called and spoke with the patients son and scheduled patient to see Dr. Darrick Huntsman tomorrow for possible UTI.  Kealii Thueson,cma

## 2022-02-16 ENCOUNTER — Ambulatory Visit: Payer: Medicare Other | Admitting: Internal Medicine

## 2022-02-16 ENCOUNTER — Emergency Department
Admission: EM | Admit: 2022-02-16 | Discharge: 2022-02-16 | Disposition: A | Discharge status: Home / Self Care | Payer: Medicare Other | Attending: Emergency Medicine | Admitting: Emergency Medicine

## 2022-02-16 ENCOUNTER — Telehealth: Payer: Self-pay

## 2022-02-16 ENCOUNTER — Other Ambulatory Visit: Payer: Self-pay

## 2022-02-16 DIAGNOSIS — N390 Urinary tract infection, site not specified: Secondary | ICD-10-CM

## 2022-02-16 DIAGNOSIS — R300 Dysuria: Secondary | ICD-10-CM | POA: Diagnosis present

## 2022-02-16 LAB — URINALYSIS, ROUTINE W REFLEX MICROSCOPIC
Bacteria, UA: NONE SEEN
Bilirubin Urine: NEGATIVE
Glucose, UA: >=500 mg/dL — AB
Ketones, ur: NEGATIVE mg/dL
Nitrite: NEGATIVE
Protein, ur: NEGATIVE mg/dL
Specific Gravity, Urine: 1.024 (ref 1.005–1.030)
Squamous Epithelial / HPF: NONE SEEN (ref 0–5)
WBC, UA: >50 WBC/hpf — ABNORMAL HIGH (ref 0–5)
pH: 5 (ref 5.0–8.0)

## 2022-02-16 LAB — BASIC METABOLIC PANEL
Anion gap: 9 (ref 5–15)
BUN: 31 mg/dL — ABNORMAL HIGH (ref 8–23)
CO2: 22 mmol/L (ref 22–32)
Calcium: 9.2 mg/dL (ref 8.9–10.3)
Chloride: 107 mmol/L (ref 98–111)
Creatinine, Ser: 1.36 mg/dL — ABNORMAL HIGH (ref 0.61–1.24)
GFR, Estimated: 56 mL/min — ABNORMAL LOW (ref >60)
Glucose, Bld: 141 mg/dL — ABNORMAL HIGH (ref 70–99)
Potassium: 4.4 mmol/L (ref 3.5–5.1)
Sodium: 138 mmol/L (ref 135–145)

## 2022-02-16 LAB — CBC
HCT: 49.3 % (ref 39.0–52.0)
Hemoglobin: 15.7 g/dL (ref 13.0–17.0)
MCH: 30.5 pg (ref 26.0–34.0)
MCHC: 31.8 g/dL (ref 30.0–36.0)
MCV: 95.7 fL (ref 80.0–100.0)
Platelets: 284 10*3/uL (ref 150–400)
RBC: 5.15 MIL/uL (ref 4.22–5.81)
RDW: 12.7 % (ref 11.5–15.5)
WBC: 13.6 10*3/uL — ABNORMAL HIGH (ref 4.0–10.5)
nRBC: 0 % (ref 0.0–0.2)

## 2022-02-16 MED ORDER — SODIUM CHLORIDE 0.9 % IV BOLUS
1000.0000 mL | Freq: Once | INTRAVENOUS | Status: AC
  Administered 2022-02-16: 1000 mL via INTRAVENOUS

## 2022-02-16 MED ORDER — CEPHALEXIN 500 MG PO CAPS: 500.0000 mg | ORAL_CAPSULE | Freq: Four times a day (QID) | ORAL | 0 refills | Status: AC

## 2022-02-16 MED ORDER — SODIUM CHLORIDE 0.9 % IV SOLN
1.0000 g | Freq: Once | INTRAVENOUS | Status: AC
  Administered 2022-02-16: 1 g via INTRAVENOUS
  Filled 2022-02-16: qty 10

## 2022-02-16 MED ORDER — ACETAMINOPHEN 500 MG PO TABS
1000.0000 mg | ORAL_TABLET | Freq: Once | ORAL | Status: AC
Start: 2022-02-16 — End: 2022-02-16
  Administered 2022-02-16: 1000 mg via ORAL
  Filled 2022-02-16: qty 2

## 2022-02-16 MED ORDER — CEFTRIAXONE SODIUM 1 G IJ SOLR: 1.0000 g | Freq: Once | INTRAMUSCULAR | Status: DC

## 2022-02-16 NOTE — ED Provider Triage Note (Signed)
Emergency Medicine Provider Triage Evaluation Note  Danny Friends Kollman Sr. , a 71 y.o. male  was evaluated in triage.  Pt complains of dysuria and need for repeat labs according to his PCP. No fever or other concerns.  Physical Exam  Ht 6' (1.829 m)   Wt 90.7 kg   BMI 27.12 kg/m  Gen:   Awake, no distress   Resp:  Normal effort  MSK:   Moves extremities without difficulty  Other:    Medical Decision Making  Medically screening exam initiated at 1:52 PM.  Appropriate orders placed.  Danny Friends Diodato Sr. was informed that the remainder of the evaluation will be completed by another provider, this initial triage assessment does not replace that evaluation, and the importance of remaining in the ED until their evaluation is complete.    Chinita Pester, FNP 02/16/22 1354

## 2022-02-16 NOTE — Telephone Encounter (Signed)
I called and spoke with the patients son and informed him that the provider reviews the patients chart and feels he needs to go to the ER to be checked instead of having a office visit, the son was very cooperative and agreed to take his father to the ED, his appointment today was cancelled.  Jaan Fischel,cma

## 2022-02-16 NOTE — ED Triage Notes (Signed)
Pt to ED, POV, in own wheelchair. 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 Pt unsure why he had to come to ED but states his PCP called his home and spoke with his son and urged him to bring pt to ED for bloodwork. Pt states it may have been to check his kidney function. Pt states he just wants to go home. Pt states he took his "diabetes shot" yesterday and after this he felt confused "like I was in another world". When asked, pt endorses burning with urination which started today.

## 2022-02-16 NOTE — ED Provider Notes (Signed)
[facility] Provider Note    Event Date/Time   First MD Initiated Contact with Patient 02/16/22 1715     (approximate)   History   Felt out of it   HPI  BERTHOLD GLACE Sr. is a 71 y.o. male who presents to the emergency department today at the advice of primary care.  He states he is unsure why they sent him to the emergency department.  Does say that the other day he felt like he was in a different world.  He was having a hard time concentrating.  He says this is gradually been getting better.  He denied any headache associated with this.  He denies any recent fevers.  States that he has had burning with urination recently.   Physical Exam   Triage Vital Signs: ED Triage Vitals  Enc Vitals Group     BP 02/16/22 1353 (!) 144/84     Pulse Rate 02/16/22 1353 (!) 111     Resp 02/16/22 1353 16     Temp 02/16/22 1353 98.3 F (36.8 C)     Temp Source 02/16/22 1353 Oral     SpO2 02/16/22 1353 96 %     Weight 02/16/22 1348 199 lb 15.3 oz (90.7 kg)     Height 02/16/22 1348 6' (1.829 m)     Head Circumference --      Peak Flow --      Pain Score 02/16/22 1348 0     Pain Loc --      Pain Edu? --      Excl. in GC? --     Most recent vital signs: Vitals:   02/16/22 1656 02/16/22 1712  BP: 123/81 133/87  Pulse: (!) 124 (!) 110  Resp: 18 18  Temp:    SpO2: 94% 95%    General: Awake, alert, oriented. CV:  Good peripheral perfusion. Tachycardia Resp:  Normal effort. Lungs clear. Abd:  No distention. Non tender.  ED Results / Procedures / Treatments   Labs (all labs ordered are listed, but only abnormal results are displayed) Labs Reviewed  URINALYSIS, ROUTINE W REFLEX MICROSCOPIC - Abnormal; Notable for the following components:      Result Value   Color, Urine YELLOW (*)    APPearance HAZY (*)    Glucose, UA >=500 (*)    Hgb urine dipstick SMALL (*)    Leukocytes,Ua MODERATE (*)    WBC, UA >50 (*)    All other components within normal  limits  BASIC METABOLIC PANEL - Abnormal; Notable for the following components:   Glucose, Bld 141 (*)    BUN 31 (*)    Creatinine, Ser 1.36 (*)    GFR, Estimated 56 (*)    All other components within normal limits  CBC - Abnormal; Notable for the following components:   WBC 13.6 (*)    All other components within normal limits     EKG  None   RADIOLOGY None   PROCEDURES:  Critical Care performed: No  Procedures   MEDICATIONS ORDERED IN ED: Medications  cefTRIAXone (ROCEPHIN) 1 g in sodium chloride 0.9 % 100 mL IVPB (has no administration in time range)  sodium chloride 0.9 % bolus 1,000 mL (has no administration in time range)     IMPRESSION / MDM / ASSESSMENT AND PLAN / ED COURSE  I reviewed the triage vital signs and the nursing notes.  Differential diagnosis includes, but is not limited to, anemia, electrolyte abnormality, infection.  Patient's presentation is most consistent with acute presentation with potential threat to life or bodily function.  Patient presents to the emergency department today because of concerns for episode of feeling out of it.  Some my exam patient is feeling better.  Work-up here is consistent with urinary tract infection.  Patient was given IV fluids and antibiotics.  We will plan on discharging with further antibiotics.  Discussed findings with patient and son. Encouraged PCP follow up and return to the emergency department for any worsening symptoms.    FINAL CLINICAL IMPRESSION(S) / ED DIAGNOSES   Final diagnoses:  Lower urinary tract infection        Note:  This document was prepared using Dragon voice recognition software and may include unintentional dictation errors.    Phineas Semen, MD 02/16/22 1919

## 2022-02-16 NOTE — Discharge Instructions (Signed)
Please seek medical attention for any high fevers, chest pain, shortness of breath, change in behavior, persistent vomiting, bloody stool or any other new or concerning symptoms.  

## 2022-02-19 ENCOUNTER — Other Ambulatory Visit: Payer: Self-pay

## 2022-02-19 ENCOUNTER — Emergency Department: Payer: Medicare Other

## 2022-02-19 ENCOUNTER — Emergency Department
Admission: EM | Admit: 2022-02-19 | Discharge: 2022-02-19 | Disposition: A | Discharge status: Home / Self Care | Payer: Medicare Other | Attending: Emergency Medicine | Admitting: Emergency Medicine

## 2022-02-19 DIAGNOSIS — W19XXXA Unspecified fall, initial encounter: Secondary | ICD-10-CM

## 2022-02-19 DIAGNOSIS — M5030 Other cervical disc degeneration, unspecified cervical region: Secondary | ICD-10-CM | POA: Diagnosis not present

## 2022-02-19 DIAGNOSIS — G9389 Other specified disorders of brain: Secondary | ICD-10-CM | POA: Diagnosis not present

## 2022-02-19 DIAGNOSIS — Z743 Need for continuous supervision: Secondary | ICD-10-CM | POA: Diagnosis not present

## 2022-02-19 DIAGNOSIS — I10 Essential (primary) hypertension: Secondary | ICD-10-CM | POA: Diagnosis not present

## 2022-02-19 DIAGNOSIS — J439 Emphysema, unspecified: Secondary | ICD-10-CM | POA: Diagnosis not present

## 2022-02-19 DIAGNOSIS — W06XXXA Fall from bed, initial encounter: Secondary | ICD-10-CM | POA: Insufficient documentation

## 2022-02-19 DIAGNOSIS — I6381 Other cerebral infarction due to occlusion or stenosis of small artery: Secondary | ICD-10-CM | POA: Diagnosis not present

## 2022-02-19 DIAGNOSIS — S2232XA Fracture of one rib, left side, initial encounter for closed fracture: Secondary | ICD-10-CM | POA: Diagnosis not present

## 2022-02-19 DIAGNOSIS — R0902 Hypoxemia: Secondary | ICD-10-CM | POA: Diagnosis not present

## 2022-02-19 DIAGNOSIS — S299XXA Unspecified injury of thorax, initial encounter: Secondary | ICD-10-CM | POA: Diagnosis present

## 2022-02-19 DIAGNOSIS — R6889 Other general symptoms and signs: Secondary | ICD-10-CM | POA: Diagnosis not present

## 2022-02-19 DIAGNOSIS — Z043 Encounter for examination and observation following other accident: Secondary | ICD-10-CM | POA: Diagnosis not present

## 2022-02-19 DIAGNOSIS — R52 Pain, unspecified: Secondary | ICD-10-CM | POA: Diagnosis not present

## 2022-02-19 LAB — CBC WITH DIFFERENTIAL/PLATELET
Abs Immature Granulocytes: 0.06 10*3/uL (ref 0.00–0.07)
Basophils Absolute: 0 10*3/uL (ref 0.0–0.1)
Basophils Relative: 0 %
Eosinophils Absolute: 0.3 10*3/uL (ref 0.0–0.5)
Eosinophils Relative: 5 %
HCT: 45.8 % (ref 39.0–52.0)
Hemoglobin: 14.5 g/dL (ref 13.0–17.0)
Immature Granulocytes: 1 %
Lymphocytes Relative: 22 %
Lymphs Abs: 1.7 10*3/uL (ref 0.7–4.0)
MCH: 30.5 pg (ref 26.0–34.0)
MCHC: 31.7 g/dL (ref 30.0–36.0)
MCV: 96.2 fL (ref 80.0–100.0)
Monocytes Absolute: 1.4 10*3/uL — ABNORMAL HIGH (ref 0.1–1.0)
Monocytes Relative: 19 %
Neutro Abs: 4 10*3/uL (ref 1.7–7.7)
Neutrophils Relative %: 53 %
Platelets: 254 10*3/uL (ref 150–400)
RBC: 4.76 MIL/uL (ref 4.22–5.81)
RDW: 12.7 % (ref 11.5–15.5)
WBC: 7.5 10*3/uL (ref 4.0–10.5)
nRBC: 0 % (ref 0.0–0.2)

## 2022-02-19 LAB — COMPREHENSIVE METABOLIC PANEL
ALT: 32 U/L (ref 0–44)
AST: 35 U/L (ref 15–41)
Albumin: 3.6 g/dL (ref 3.5–5.0)
Alkaline Phosphatase: 79 U/L (ref 38–126)
Anion gap: 8 (ref 5–15)
BUN: 39 mg/dL — ABNORMAL HIGH (ref 8–23)
CO2: 24 mmol/L (ref 22–32)
Calcium: 9.2 mg/dL (ref 8.9–10.3)
Chloride: 107 mmol/L (ref 98–111)
Creatinine, Ser: 1.49 mg/dL — ABNORMAL HIGH (ref 0.61–1.24)
GFR, Estimated: 50 mL/min — ABNORMAL LOW (ref >60)
Glucose, Bld: 130 mg/dL — ABNORMAL HIGH (ref 70–99)
Potassium: 4.3 mmol/L (ref 3.5–5.1)
Sodium: 139 mmol/L (ref 135–145)
Total Bilirubin: 0.7 mg/dL (ref 0.3–1.2)
Total Protein: 7.1 g/dL (ref 6.5–8.1)

## 2022-02-19 LAB — LACTIC ACID, PLASMA: Lactic Acid, Venous: 1.3 mmol/L (ref 0.5–1.9)

## 2022-02-19 LAB — TROPONIN I (HIGH SENSITIVITY): Troponin I (High Sensitivity): 15 ng/L (ref <18)

## 2022-02-19 LAB — CK: Total CK: 150 U/L (ref 49–397)

## 2022-02-19 MED ORDER — HYDROCODONE-ACETAMINOPHEN 5-325 MG PO TABS: 1.0000 | ORAL_TABLET | ORAL | 0 refills | Status: DC | PRN

## 2022-02-19 MED ORDER — HYDROCODONE-ACETAMINOPHEN 5-325 MG PO TABS
1.0000 | ORAL_TABLET | Freq: Once | ORAL | Status: AC
  Administered 2022-02-19: 1 via ORAL
  Filled 2022-02-19: qty 1

## 2022-02-19 NOTE — ED Triage Notes (Signed)
Pt comes via EMS with c/o rib pain. Pt states he fell 4 days ago and hurt left side. Pt states he doesn't want any xrays just pain medication.

## 2022-02-19 NOTE — ED Notes (Signed)
Pts pants removed and pts perineal area as well as buttocks washed. Pt requested that his pants be thrown away due to them being soiled with feces.

## 2022-02-19 NOTE — ED Provider Notes (Signed)
[facility] Provider Note  Patient Contact: 4:43 PM (approximate)   History   Rib Injury   HPI  Danny HYUN Sr. is a 71 y.o. male who presents the emergency department for evaluation of left rib pain.  Patient states that he fell out of his bed and possibly laid in the floor all evening.  Patient states that he did not hit his head to his knowledge but he cannot fully rule out hitting his head during this fall.  Patient states that he does have a home health nurse that comes out to see him but his son found him in the floor again with unsure how long the patient had been before.  It appears that patient fell 3 days ago, was seen by [place] 4 days ago for an unrelated complaint.  Patient is only complaining of left rib pain at this time.  On review of patient's medical record patient did have a f UTI which she is taking antibiotics for.  No other complaints at this time.  Patient denies any chest pain, shortness of breath, abdominal pain, headache, vision changes, neck pain, musculoskeletal pain other than left rib pain.     Physical Exam   Triage Vital Signs: ED Triage Vitals  Enc Vitals Group     BP 02/19/22 1525 113/72     Pulse Rate 02/19/22 1525 93     Resp 02/19/22 1525 20     Temp 02/19/22 1525 98 F (36.7 C)     Temp Source 02/19/22 1525 Oral     SpO2 02/19/22 1525 93 %     Weight --      Height --      Head Circumference --      Peak Flow --      Pain Score 02/19/22 1509 10     Pain Loc --      Pain Edu? --      Excl. in GC? --     Most recent vital signs: Vitals:   02/19/22 1525 02/19/22 1837  BP: 113/72 118/70  Pulse: 93 88  Resp: 20 20  Temp: 98 F (36.7 C)   SpO2: 93% 94%     General: Alert and in no acute distress. Eyes:  PERRL. EOMI. Head: No acute traumatic findings ENT:      Ears:       Nose: No congestion/rhinnorhea.      Mouth/Throat: Mucous membranes are moist. Neck: No stridor. No cervical spine tenderness to  palpation. Hematological/Lymphatic/Immunilogical: No cervical lymphadenopathy. Cardiovascular:  Good peripheral perfusion Respiratory: Normal respiratory effort without tachypnea or retractions. Lungs CTAB. Good air entry to the bases with no decreased or absent breath sounds Gastrointestinal: Bowel sounds 4 quadrants. Soft and nontender to palpation. No guarding or rigidity. No palpable masses. No distention. No CVA tenderness. Musculoskeletal: Full range of motion to all extremities.  No visible signs of trauma to the left ribs.  Patient has tenderness along the distal third ribs with no palpable abnormality or crepitus.  No subcutaneous emphysema.  Good underlying breath sounds bilaterally. Neurologic:  No gross focal neurologic deficits are appreciated.  Skin:   No rash noted Other:   ED Results / Procedures / Treatments   Labs (all labs ordered are listed, but only abnormal results are displayed) Labs Reviewed  COMPREHENSIVE METABOLIC PANEL - Abnormal; Notable for the following components:      Result Value   Glucose, Bld 130 (*)    BUN 39 (*)  Creatinine, Ser 1.49 (*)    GFR, Estimated 50 (*)    All other components within normal limits  CBC WITH DIFFERENTIAL/PLATELET - Abnormal; Notable for the following components:   Monocytes Absolute 1.4 (*)    All other components within normal limits  CK  LACTIC ACID, PLASMA  URINALYSIS, ROUTINE W REFLEX MICROSCOPIC  TROPONIN I (HIGH SENSITIVITY)     EKG     RADIOLOGY  I personally viewed, evaluated, and interpreted these images as part of my medical decision making, as well as reviewing the written report by the radiologist.  ED Provider Interpretation: No evidence of intracranial hemorrhage, skull fracture or cervical spine fracture.  Patient does have findings consistent with a solitary left rib fracture.  DG Ribs Unilateral W/Chest Left  Result Date: 02/19/2022 CLINICAL DATA:  Larey Seat out of bed rib pain EXAM: LEFT RIBS  AND CHEST - 3+ VIEW COMPARISON:  02/07/2022 FINDINGS: Single-view chest demonstrates low lung volumes. No consolidation, pleural effusion, or pneumothorax. Emphysema. Left rib series demonstrates possible acute left eighth anterior rib fracture. IMPRESSION: 1. Emphysema.  No pneumothorax 2. Possible acute left eighth rib fracture Electronically Signed   By: Jasmine Pang M.D.   On: 02/19/2022 17:49   CT Head Wo Contrast  Result Date: 02/19/2022 CLINICAL DATA:  Blunt poly trauma.  Fell 4 days ago. EXAM: CT HEAD WITHOUT CONTRAST CT CERVICAL SPINE WITHOUT CONTRAST TECHNIQUE: Multidetector CT imaging of the head and cervical spine was performed following the standard protocol without intravenous contrast. Multiplanar CT image reconstructions of the cervical spine were also generated. RADIATION DOSE REDUCTION: This exam was performed according to the departmental dose-optimization program which includes automated exposure control, adjustment of the mA and/or kV according to patient size and/or use of iterative reconstruction technique. COMPARISON:  None Available. FINDINGS: CT HEAD FINDINGS Brain: No evidence of acute infarction, hemorrhage, hydrocephalus, extra-axial collection or mass lesion/mass effect. Scattered area of low-attenuation of the periventricular white matter presumed chronic microvascular ischemic changes. Focal encephalomalacia of the right periventricular region, which may represent microvascular ischemic changes of small prior infarct. Lacunar infarct of the right thalamus, unchanged. Prominent CSF density structure in the posterior fossa most consistent with mega cisterna magna, unchanged. Vascular: No hyperdense vessel or unexpected calcification. Skull: Normal. Negative for fracture or focal lesion. CT CERVICAL SPINE FINDINGS Alignment: Straightening of the cervical spine. Skull base and vertebrae: No acute fracture. No primary bone lesion or focal pathologic process. Soft tissues and spinal  canal: No prevertebral fluid or swelling. No visible canal hematoma. Disc levels: Mild degenerate disc disease with small anterior osteophyte prominent at C4-C5. No significant spinal canal or neural foraminal stenosis. Upper chest: Advanced emphysematous changes of the lung apices. Other: None IMPRESSION: CT head: 1.  No acute intracranial abnormality. 2.  Stable chronic findings as described above. CT cervical spine: 1.  No acute traumatic fracture or subluxation. 2. Mild degenerate disc disease of the cervical spine without significant spinal canal or neural foraminal stenosis. 3.  Advanced emphysematous changes of lung apices. Electronically Signed   By: Larose Hires D.O.   On: 02/19/2022 17:21   CT Cervical Spine Wo Contrast  Result Date: 02/19/2022 CLINICAL DATA:  Blunt poly trauma.  Fell 4 days ago. EXAM: CT HEAD WITHOUT CONTRAST CT CERVICAL SPINE WITHOUT CONTRAST TECHNIQUE: Multidetector CT imaging of the head and cervical spine was performed following the standard protocol without intravenous contrast. Multiplanar CT image reconstructions of the cervical spine were also generated. RADIATION DOSE REDUCTION: This exam  was performed according to the departmental dose-optimization program which includes automated exposure control, adjustment of the mA and/or kV according to patient size and/or use of iterative reconstruction technique. COMPARISON:  None Available. FINDINGS: CT HEAD FINDINGS Brain: No evidence of acute infarction, hemorrhage, hydrocephalus, extra-axial collection or mass lesion/mass effect. Scattered area of low-attenuation of the periventricular white matter presumed chronic microvascular ischemic changes. Focal encephalomalacia of the right periventricular region, which may represent microvascular ischemic changes of small prior infarct. Lacunar infarct of the right thalamus, unchanged. Prominent CSF density structure in the posterior fossa most consistent with mega cisterna magna,  unchanged. Vascular: No hyperdense vessel or unexpected calcification. Skull: Normal. Negative for fracture or focal lesion. CT CERVICAL SPINE FINDINGS Alignment: Straightening of the cervical spine. Skull base and vertebrae: No acute fracture. No primary bone lesion or focal pathologic process. Soft tissues and spinal canal: No prevertebral fluid or swelling. No visible canal hematoma. Disc levels: Mild degenerate disc disease with small anterior osteophyte prominent at C4-C5. No significant spinal canal or neural foraminal stenosis. Upper chest: Advanced emphysematous changes of the lung apices. Other: None IMPRESSION: CT head: 1.  No acute intracranial abnormality. 2.  Stable chronic findings as described above. CT cervical spine: 1.  No acute traumatic fracture or subluxation. 2. Mild degenerate disc disease of the cervical spine without significant spinal canal or neural foraminal stenosis. 3.  Advanced emphysematous changes of lung apices. Electronically Signed   By: Larose Hires D.O.   On: 02/19/2022 17:21    PROCEDURES:  Critical Care performed: No  Procedures   MEDICATIONS ORDERED IN ED: Medications  HYDROcodone-acetaminophen (NORCO/VICODIN) 5-325 MG per tablet 1 tablet (has no administration in time range)     IMPRESSION / MDM / ASSESSMENT AND PLAN / ED COURSE  I reviewed the triage vital signs and the nursing notes.                              Differential diagnosis includes, but is not limited to, fall, rib fracture, skull fracture, intracranial hemorrhage, sepsis, UTI, pneumonia  Patient's presentation is most consistent with acute presentation with potential threat to life or bodily function.   Patient's diagnosis is consistent with fall, rib fracture.  Patient presents to the ED after falling out of bed.  Patient states that he was in the floor for an unknown amount of time.  Patient was recently treated for UTI but states that he only started his antibiotics today.  Patient  had labs, imaging at this time.  Patient with overall reassuring work-up.  He does have a solitary rib fracture.  No other significant findings at this time.  Patient will have medication for pain for his rib fracture at home.  Is stable for discharge at this time.  Patient is instructed to keep taking his antibiotics.   Patient is given ED precautions to return to the ED for any worsening or new symptoms.        FINAL CLINICAL IMPRESSION(S) / ED DIAGNOSES   Final diagnoses:  Fall, initial encounter  Closed fracture of one rib of left side, initial encounter     Rx / DC Orders   ED Discharge Orders          Ordered    HYDROcodone-acetaminophen (NORCO/VICODIN) 5-325 MG tablet  Every 4 hours PRN        02/19/22 2132  Note:  This document was prepared using Dragon voice recognition software and may include unintentional dictation errors.   Lanette Hampshire 02/19/22 2133    Georga Hacking, MD 02/19/22 [phone]

## 2022-02-23 DIAGNOSIS — Z87891 Personal history of nicotine dependence: Secondary | ICD-10-CM | POA: Diagnosis not present

## 2022-02-23 DIAGNOSIS — Z7984 Long term (current) use of oral hypoglycemic drugs: Secondary | ICD-10-CM | POA: Diagnosis not present

## 2022-02-23 DIAGNOSIS — Z9181 History of falling: Secondary | ICD-10-CM | POA: Diagnosis not present

## 2022-02-23 DIAGNOSIS — Z89512 Acquired absence of left leg below knee: Secondary | ICD-10-CM | POA: Diagnosis not present

## 2022-02-23 DIAGNOSIS — Z7902 Long term (current) use of antithrombotics/antiplatelets: Secondary | ICD-10-CM | POA: Diagnosis not present

## 2022-02-23 DIAGNOSIS — Z7982 Long term (current) use of aspirin: Secondary | ICD-10-CM | POA: Diagnosis not present

## 2022-02-23 DIAGNOSIS — J432 Centrilobular emphysema: Secondary | ICD-10-CM | POA: Diagnosis not present

## 2022-02-23 DIAGNOSIS — E1165 Type 2 diabetes mellitus with hyperglycemia: Secondary | ICD-10-CM | POA: Diagnosis not present

## 2022-02-23 DIAGNOSIS — Z8673 Personal history of transient ischemic attack (TIA), and cerebral infarction without residual deficits: Secondary | ICD-10-CM | POA: Diagnosis not present

## 2022-02-23 DIAGNOSIS — Z794 Long term (current) use of insulin: Secondary | ICD-10-CM | POA: Diagnosis not present

## 2022-02-23 DIAGNOSIS — I10 Essential (primary) hypertension: Secondary | ICD-10-CM | POA: Diagnosis not present

## 2022-02-27 ENCOUNTER — Encounter: Payer: Self-pay | Admitting: Emergency Medicine

## 2022-02-27 ENCOUNTER — Ambulatory Visit: Payer: Medicare Other | Admitting: Family Medicine

## 2022-02-27 ENCOUNTER — Emergency Department: Payer: Medicare Other

## 2022-02-27 ENCOUNTER — Other Ambulatory Visit: Payer: Self-pay

## 2022-02-27 ENCOUNTER — Emergency Department
Admission: EM | Admit: 2022-02-27 | Discharge: 2022-02-27 | Disposition: A | Discharge status: Home / Self Care | Payer: Medicare Other | Attending: Emergency Medicine | Admitting: Emergency Medicine

## 2022-02-27 DIAGNOSIS — R0789 Other chest pain: Secondary | ICD-10-CM | POA: Diagnosis not present

## 2022-02-27 DIAGNOSIS — R6889 Other general symptoms and signs: Secondary | ICD-10-CM | POA: Diagnosis not present

## 2022-02-27 DIAGNOSIS — R079 Chest pain, unspecified: Secondary | ICD-10-CM | POA: Diagnosis not present

## 2022-02-27 DIAGNOSIS — Z743 Need for continuous supervision: Secondary | ICD-10-CM | POA: Diagnosis not present

## 2022-02-27 DIAGNOSIS — R739 Hyperglycemia, unspecified: Secondary | ICD-10-CM | POA: Diagnosis not present

## 2022-02-27 LAB — BASIC METABOLIC PANEL
Anion gap: 8 (ref 5–15)
BUN: 32 mg/dL — ABNORMAL HIGH (ref 8–23)
CO2: 22 mmol/L (ref 22–32)
Calcium: 9.1 mg/dL (ref 8.9–10.3)
Chloride: 108 mmol/L (ref 98–111)
Creatinine, Ser: 1.66 mg/dL — ABNORMAL HIGH (ref 0.61–1.24)
GFR, Estimated: 44 mL/min — ABNORMAL LOW (ref >60)
Glucose, Bld: 213 mg/dL — ABNORMAL HIGH (ref 70–99)
Potassium: 4.5 mmol/L (ref 3.5–5.1)
Sodium: 138 mmol/L (ref 135–145)

## 2022-02-27 LAB — CBC
HCT: 44.5 % (ref 39.0–52.0)
Hemoglobin: 14.3 g/dL (ref 13.0–17.0)
MCH: 30.4 pg (ref 26.0–34.0)
MCHC: 32.1 g/dL (ref 30.0–36.0)
MCV: 94.7 fL (ref 80.0–100.0)
Platelets: 376 10*3/uL (ref 150–400)
RBC: 4.7 MIL/uL (ref 4.22–5.81)
RDW: 12.5 % (ref 11.5–15.5)
WBC: 9.9 10*3/uL (ref 4.0–10.5)
nRBC: 0 % (ref 0.0–0.2)

## 2022-02-27 LAB — TROPONIN I (HIGH SENSITIVITY)
Troponin I (High Sensitivity): 13 ng/L (ref <18)
Troponin I (High Sensitivity): 10 ng/L (ref <18)

## 2022-02-27 MED ORDER — SODIUM CHLORIDE 0.9 % IV BOLUS
1000.0000 mL | Freq: Once | INTRAVENOUS | Status: AC
  Administered 2022-02-27: 1000 mL via INTRAVENOUS

## 2022-02-27 NOTE — ED Triage Notes (Signed)
Patient to ED via [facility] from home for CP. Pt took 2 nitro's at home and states he no longer as pain. Aox4. Hx of MI

## 2022-02-27 NOTE — ED Provider Notes (Signed)
[facility] Provider Note   Event Date/Time   First MD Initiated Contact with Patient 02/27/22 1639     (approximate) History  Chest Pain  HPI IVAAN LIDDY Sr. is a 71 y.o. male who presents for chest pain that began today approximately 4 hours prior to arrival.  Patient describes central chest pressure that he states feels like his normal emphysema however he decided to take 2 nitroglycerin tablets that he states resolved this pain completely.  Patient states that he has been pain-free since taking his nitroglycerin approximately 3 hours prior to arrival.  Patient denies any worsening of this pain on exertion.  Patient states that he could not do his physical therapy today because his chest hurt too bad. ROS: Patient currently denies any vision changes, tinnitus, difficulty speaking, facial droop, sore throat, chest pain, shortness of breath, abdominal pain, nausea/vomiting/diarrhea, dysuria, or weakness/numbness/paresthesias in any extremity   Physical Exam  Triage Vital Signs: ED Triage Vitals  Enc Vitals Group     BP 02/27/22 1503 (!) 77/59     Pulse Rate 02/27/22 1503 99     Resp 02/27/22 1503 18     Temp 02/27/22 1503 98.4 F (36.9 C)     Temp Source 02/27/22 1503 Oral     SpO2 02/27/22 1351 95 %     Weight 02/27/22 1458 200 lb (90.7 kg)     Height 02/27/22 1458 6' (1.829 m)     Head Circumference --      Peak Flow --      Pain Score 02/27/22 1458 0     Pain Loc --      Pain Edu? --      Excl. in GC? --    Most recent vital signs: Vitals:   02/27/22 1503 02/27/22 1654  BP: (!) 77/59 (!) 80/58  Pulse: 99 95  Resp: 18 16  Temp: 98.4 F (36.9 C)   SpO2: 96% 100%   General: Awake, oriented x4. CV:  Good peripheral perfusion.  Resp:  Normal effort.  Abd:  No distention.  Other:  Elderly overweight Caucasian male laying in bed in no acute distress eating graham crackers ED Results / Procedures / Treatments  Labs (all labs ordered are  listed, but only abnormal results are displayed) Labs Reviewed  BASIC METABOLIC PANEL - Abnormal; Notable for the following components:      Result Value   Glucose, Bld 213 (*)    BUN 32 (*)    Creatinine, Ser 1.66 (*)    GFR, Estimated 44 (*)    All other components within normal limits  CBC  TROPONIN I (HIGH SENSITIVITY)  TROPONIN I (HIGH SENSITIVITY)   EKG ED ECG REPORT I, Merwyn Katos, the attending physician, personally viewed and interpreted this ECG. Date: 02/27/2022 EKG Time: 1414 Rate: 107 Rhythm: Tachycardic sinus rhythm QRS Axis: normal Intervals: normal ST/T Wave abnormalities: normal Narrative Interpretation: Tachycardic sinus rhythm with premature atrial contractions.  No evidence of acute ischemia RADIOLOGY ED MD interpretation: 2 view chest x-ray interpreted by me shows no evidence of acute abnormalities including no pneumonia, pneumothorax, or widened mediastinum.  There is mild streaky bibasilar opacities favoring atelectasis as well as evidence of emphysema -Agree with radiology assessment Official radiology report(s): DG Chest 2 View  Result Date: 02/27/2022 CLINICAL DATA:  CP EXAM: CHEST - 2 VIEW COMPARISON:  02/19/2022. FINDINGS: Mild streaky bibasilar opacities. No confluent consolidation. No visible pleural effusions or pneumothorax. No acute osseous abnormality.  IMPRESSION: 1. Mild streaky bibasilar opacities, favor atelectasis. Infection not excluded. 2. Emphysema. Electronically Signed   By: Feliberto Harts M.D.   On: 02/27/2022 15:48   PROCEDURES: Critical Care performed: No .1-3 Lead EKG Interpretation  Performed by: Merwyn Katos, MD Authorized by: Merwyn Katos, MD     Interpretation: normal     ECG rate:  94   ECG rate assessment: normal     Rhythm: sinus rhythm     Ectopy: none     Conduction: normal    MEDICATIONS ORDERED IN ED: Medications  sodium chloride 0.9 % bolus 1,000 mL (0 mLs Intravenous Stopped 02/27/22 1653)    IMPRESSION / MDM / ASSESSMENT AND PLAN / ED COURSE  I reviewed the triage vital signs and the nursing notes.                             The patient is on the cardiac monitor to evaluate for evidence of arrhythmia and/or significant heart rate changes. Patient's presentation is most consistent with acute presentation with potential threat to life or bodily function. Workup: ECG, CXR, CBC, BMP, Troponin Findings: ECG: No overt evidence of STEMI. No evidence of Brugadas sign, delta wave, epsilon wave, significantly prolonged QTc, or malignant arrhythmia HS Troponin: Negative x2 Other Labs unremarkable for emergent problems. CXR: Without PTX, PNA, or widened mediastinum Last Stress Test:  2018 Last Heart Catheterization:  2017 HEART Score: 5  Given History, Exam, and Workup I have low suspicion for ACS, Pneumothorax, Pneumonia, Pulmonary Embolus, Tamponade, Aortic Dissection or other emergent problem as a cause for this presentation.   Reassesment: Prior to discharge patients pain was controlled and they were well appearing.  Disposition:  Discharge. Strict return precautions discussed with patient with full understanding. Advised patient to follow up promptly with primary care provider    FINAL CLINICAL IMPRESSION(S) / ED DIAGNOSES   Final diagnoses:  Chest pain, unspecified type   Rx / DC Orders   ED Discharge Orders          Ordered    Ambulatory referral to Cardiology       Comments: If you have not heard from the Cardiology office within the next 72 hours please call [phone].   02/27/22 1802           Note:  This document was prepared using Dragon voice recognition software and may include unintentional dictation errors.   Merwyn Katos, MD 02/27/22 [phone]

## 2022-02-27 NOTE — ED Provider Triage Note (Signed)
Emergency Medicine Provider Triage Evaluation Note  Danny Friends Eblen Sr. , a 71 y.o. male  was evaluated in triage.  Pt complains of chest pain that has since passed. He took 2 NTG before EMS to to his house.  Physical Exam  Ht 6' (1.829 m)   Wt 90.7 kg   SpO2 95%   BMI 27.12 kg/m  Gen:   Awake, no distress   Resp:  Normal effort  MSK:   Moves extremities without difficulty  Other:    Medical Decision Making  Medically screening exam initiated at 2:59 PM.  Appropriate orders placed.  Danny Friends Bhavsar Sr. was informed that the remainder of the evaluation will be completed by another provider, this initial triage assessment does not replace that evaluation, and the importance of remaining in the ED until their evaluation is complete.    Chinita Pester, FNP 02/27/22 1500

## 2022-02-27 NOTE — ED Triage Notes (Signed)
Per [facility] pt coming from home c/o chest pain to left side radiating to right side. Took 2 nitro prior to ems. Initial BP 63/36 and given 1L and BP to 88/71. Chest pain resolved en route. 18G RFA. Patient A&0x4.

## 2022-03-01 ENCOUNTER — Telehealth: Payer: Self-pay | Admitting: Family Medicine

## 2022-03-01 DIAGNOSIS — Z87891 Personal history of nicotine dependence: Secondary | ICD-10-CM | POA: Diagnosis not present

## 2022-03-01 DIAGNOSIS — E1165 Type 2 diabetes mellitus with hyperglycemia: Secondary | ICD-10-CM | POA: Diagnosis not present

## 2022-03-01 DIAGNOSIS — Z794 Long term (current) use of insulin: Secondary | ICD-10-CM | POA: Diagnosis not present

## 2022-03-01 DIAGNOSIS — Z89512 Acquired absence of left leg below knee: Secondary | ICD-10-CM | POA: Diagnosis not present

## 2022-03-01 DIAGNOSIS — I10 Essential (primary) hypertension: Secondary | ICD-10-CM | POA: Diagnosis not present

## 2022-03-01 DIAGNOSIS — Z8673 Personal history of transient ischemic attack (TIA), and cerebral infarction without residual deficits: Secondary | ICD-10-CM | POA: Diagnosis not present

## 2022-03-01 DIAGNOSIS — J432 Centrilobular emphysema: Secondary | ICD-10-CM | POA: Diagnosis not present

## 2022-03-01 DIAGNOSIS — Z9181 History of falling: Secondary | ICD-10-CM | POA: Diagnosis not present

## 2022-03-01 DIAGNOSIS — Z7984 Long term (current) use of oral hypoglycemic drugs: Secondary | ICD-10-CM | POA: Diagnosis not present

## 2022-03-01 DIAGNOSIS — Z7902 Long term (current) use of antithrombotics/antiplatelets: Secondary | ICD-10-CM | POA: Diagnosis not present

## 2022-03-01 DIAGNOSIS — Z7982 Long term (current) use of aspirin: Secondary | ICD-10-CM | POA: Diagnosis not present

## 2022-03-01 NOTE — Telephone Encounter (Signed)
Lena From [facility] called in request for Dr.Sonnenberg to fil out a [id number] form for pt to receive home care assistance... Thomasenia Sales is requesting a callback at [phone]

## 2022-03-02 NOTE — Telephone Encounter (Signed)
Did they fax [place] this form? If they did not send it then I do not know what this form is.

## 2022-03-02 NOTE — Telephone Encounter (Signed)
Form is in the sign basket.  Tyleah Loh,cma  

## 2022-03-05 DIAGNOSIS — Z8673 Personal history of transient ischemic attack (TIA), and cerebral infarction without residual deficits: Secondary | ICD-10-CM | POA: Diagnosis not present

## 2022-03-05 DIAGNOSIS — Z7902 Long term (current) use of antithrombotics/antiplatelets: Secondary | ICD-10-CM | POA: Diagnosis not present

## 2022-03-05 DIAGNOSIS — Z9181 History of falling: Secondary | ICD-10-CM | POA: Diagnosis not present

## 2022-03-05 DIAGNOSIS — J432 Centrilobular emphysema: Secondary | ICD-10-CM | POA: Diagnosis not present

## 2022-03-05 DIAGNOSIS — Z7984 Long term (current) use of oral hypoglycemic drugs: Secondary | ICD-10-CM | POA: Diagnosis not present

## 2022-03-05 DIAGNOSIS — Z794 Long term (current) use of insulin: Secondary | ICD-10-CM | POA: Diagnosis not present

## 2022-03-05 DIAGNOSIS — E1165 Type 2 diabetes mellitus with hyperglycemia: Secondary | ICD-10-CM | POA: Diagnosis not present

## 2022-03-05 DIAGNOSIS — I10 Essential (primary) hypertension: Secondary | ICD-10-CM | POA: Diagnosis not present

## 2022-03-05 DIAGNOSIS — Z89512 Acquired absence of left leg below knee: Secondary | ICD-10-CM | POA: Diagnosis not present

## 2022-03-05 DIAGNOSIS — Z7982 Long term (current) use of aspirin: Secondary | ICD-10-CM | POA: Diagnosis not present

## 2022-03-05 DIAGNOSIS — Z87891 Personal history of nicotine dependence: Secondary | ICD-10-CM | POA: Diagnosis not present

## 2022-03-06 DIAGNOSIS — M6281 Muscle weakness (generalized): Secondary | ICD-10-CM | POA: Diagnosis not present

## 2022-03-06 DIAGNOSIS — I619 Nontraumatic intracerebral hemorrhage, unspecified: Secondary | ICD-10-CM | POA: Diagnosis not present

## 2022-03-06 NOTE — Telephone Encounter (Signed)
I am reviewing the form. Can you contact the patient and his family and see if he needs help with bathing, dressing himself, going to the bathroom, feeding himself, or transferring himself from wheel chair to bed or other transfers? Thanks.

## 2022-03-06 NOTE — Telephone Encounter (Signed)
I called and spoke with the patient and I anked him the questions the provider wanted me to ask, patient stated that he cannot bathe himself well but he can dress himself, and he can go to the bathroom by himself, he also can go to the bathroom on his own, he can feed himself, and he stated that he can transport himself pretty well in the bed and other transport.  He only has a problem with bathing.  Danny [name],cma

## 2022-03-08 DIAGNOSIS — Z794 Long term (current) use of insulin: Secondary | ICD-10-CM | POA: Diagnosis not present

## 2022-03-08 DIAGNOSIS — I10 Essential (primary) hypertension: Secondary | ICD-10-CM | POA: Diagnosis not present

## 2022-03-08 DIAGNOSIS — Z7982 Long term (current) use of aspirin: Secondary | ICD-10-CM | POA: Diagnosis not present

## 2022-03-08 DIAGNOSIS — Z8673 Personal history of transient ischemic attack (TIA), and cerebral infarction without residual deficits: Secondary | ICD-10-CM | POA: Diagnosis not present

## 2022-03-08 DIAGNOSIS — Z89512 Acquired absence of left leg below knee: Secondary | ICD-10-CM | POA: Diagnosis not present

## 2022-03-08 DIAGNOSIS — Z7902 Long term (current) use of antithrombotics/antiplatelets: Secondary | ICD-10-CM | POA: Diagnosis not present

## 2022-03-08 DIAGNOSIS — Z9181 History of falling: Secondary | ICD-10-CM | POA: Diagnosis not present

## 2022-03-08 DIAGNOSIS — E1165 Type 2 diabetes mellitus with hyperglycemia: Secondary | ICD-10-CM | POA: Diagnosis not present

## 2022-03-08 DIAGNOSIS — Z7984 Long term (current) use of oral hypoglycemic drugs: Secondary | ICD-10-CM | POA: Diagnosis not present

## 2022-03-08 DIAGNOSIS — J432 Centrilobular emphysema: Secondary | ICD-10-CM | POA: Diagnosis not present

## 2022-03-08 DIAGNOSIS — Z87891 Personal history of nicotine dependence: Secondary | ICD-10-CM | POA: Diagnosis not present

## 2022-03-13 DIAGNOSIS — Z7984 Long term (current) use of oral hypoglycemic drugs: Secondary | ICD-10-CM | POA: Diagnosis not present

## 2022-03-13 DIAGNOSIS — E1165 Type 2 diabetes mellitus with hyperglycemia: Secondary | ICD-10-CM | POA: Diagnosis not present

## 2022-03-13 DIAGNOSIS — Z794 Long term (current) use of insulin: Secondary | ICD-10-CM | POA: Diagnosis not present

## 2022-03-13 DIAGNOSIS — Z7982 Long term (current) use of aspirin: Secondary | ICD-10-CM | POA: Diagnosis not present

## 2022-03-13 DIAGNOSIS — J432 Centrilobular emphysema: Secondary | ICD-10-CM | POA: Diagnosis not present

## 2022-03-13 DIAGNOSIS — Z8673 Personal history of transient ischemic attack (TIA), and cerebral infarction without residual deficits: Secondary | ICD-10-CM | POA: Diagnosis not present

## 2022-03-13 DIAGNOSIS — Z7951 Long term (current) use of inhaled steroids: Secondary | ICD-10-CM | POA: Diagnosis not present

## 2022-03-13 DIAGNOSIS — I10 Essential (primary) hypertension: Secondary | ICD-10-CM | POA: Diagnosis not present

## 2022-03-13 DIAGNOSIS — Z89512 Acquired absence of left leg below knee: Secondary | ICD-10-CM | POA: Diagnosis not present

## 2022-03-13 DIAGNOSIS — Z87891 Personal history of nicotine dependence: Secondary | ICD-10-CM | POA: Diagnosis not present

## 2022-03-13 DIAGNOSIS — Z7902 Long term (current) use of antithrombotics/antiplatelets: Secondary | ICD-10-CM | POA: Diagnosis not present

## 2022-03-13 DIAGNOSIS — Z9181 History of falling: Secondary | ICD-10-CM | POA: Diagnosis not present

## 2022-03-13 NOTE — Telephone Encounter (Signed)
Form completed.

## 2022-03-13 NOTE — Telephone Encounter (Signed)
I called Danny [name] and LVM informeing her that the form [id number] was ready and available for pickup or to be faxed and for her to call me back.  Amarri Satterly,cma

## 2022-03-14 DIAGNOSIS — Z87891 Personal history of nicotine dependence: Secondary | ICD-10-CM | POA: Diagnosis not present

## 2022-03-14 DIAGNOSIS — Z7951 Long term (current) use of inhaled steroids: Secondary | ICD-10-CM | POA: Diagnosis not present

## 2022-03-14 DIAGNOSIS — J432 Centrilobular emphysema: Secondary | ICD-10-CM | POA: Diagnosis not present

## 2022-03-14 DIAGNOSIS — Z7902 Long term (current) use of antithrombotics/antiplatelets: Secondary | ICD-10-CM | POA: Diagnosis not present

## 2022-03-14 DIAGNOSIS — E1165 Type 2 diabetes mellitus with hyperglycemia: Secondary | ICD-10-CM | POA: Diagnosis not present

## 2022-03-14 DIAGNOSIS — Z7982 Long term (current) use of aspirin: Secondary | ICD-10-CM | POA: Diagnosis not present

## 2022-03-14 DIAGNOSIS — Z8673 Personal history of transient ischemic attack (TIA), and cerebral infarction without residual deficits: Secondary | ICD-10-CM | POA: Diagnosis not present

## 2022-03-14 DIAGNOSIS — Z9181 History of falling: Secondary | ICD-10-CM | POA: Diagnosis not present

## 2022-03-14 DIAGNOSIS — I10 Essential (primary) hypertension: Secondary | ICD-10-CM | POA: Diagnosis not present

## 2022-03-14 DIAGNOSIS — Z89512 Acquired absence of left leg below knee: Secondary | ICD-10-CM | POA: Diagnosis not present

## 2022-03-14 DIAGNOSIS — Z7984 Long term (current) use of oral hypoglycemic drugs: Secondary | ICD-10-CM | POA: Diagnosis not present

## 2022-03-14 DIAGNOSIS — Z794 Long term (current) use of insulin: Secondary | ICD-10-CM | POA: Diagnosis not present

## 2022-03-15 DIAGNOSIS — Z7902 Long term (current) use of antithrombotics/antiplatelets: Secondary | ICD-10-CM | POA: Diagnosis not present

## 2022-03-15 DIAGNOSIS — Z7951 Long term (current) use of inhaled steroids: Secondary | ICD-10-CM | POA: Diagnosis not present

## 2022-03-15 DIAGNOSIS — E1165 Type 2 diabetes mellitus with hyperglycemia: Secondary | ICD-10-CM | POA: Diagnosis not present

## 2022-03-15 DIAGNOSIS — Z87891 Personal history of nicotine dependence: Secondary | ICD-10-CM | POA: Diagnosis not present

## 2022-03-15 DIAGNOSIS — Z89512 Acquired absence of left leg below knee: Secondary | ICD-10-CM | POA: Diagnosis not present

## 2022-03-15 DIAGNOSIS — Z9181 History of falling: Secondary | ICD-10-CM | POA: Diagnosis not present

## 2022-03-15 DIAGNOSIS — J432 Centrilobular emphysema: Secondary | ICD-10-CM | POA: Diagnosis not present

## 2022-03-15 DIAGNOSIS — Z8673 Personal history of transient ischemic attack (TIA), and cerebral infarction without residual deficits: Secondary | ICD-10-CM | POA: Diagnosis not present

## 2022-03-15 DIAGNOSIS — I10 Essential (primary) hypertension: Secondary | ICD-10-CM | POA: Diagnosis not present

## 2022-03-15 DIAGNOSIS — Z7982 Long term (current) use of aspirin: Secondary | ICD-10-CM | POA: Diagnosis not present

## 2022-03-15 DIAGNOSIS — Z7984 Long term (current) use of oral hypoglycemic drugs: Secondary | ICD-10-CM | POA: Diagnosis not present

## 2022-03-15 DIAGNOSIS — Z794 Long term (current) use of insulin: Secondary | ICD-10-CM | POA: Diagnosis not present

## 2022-03-17 DIAGNOSIS — N179 Acute kidney failure, unspecified: Secondary | ICD-10-CM | POA: Diagnosis not present

## 2022-03-17 DIAGNOSIS — J449 Chronic obstructive pulmonary disease, unspecified: Secondary | ICD-10-CM | POA: Diagnosis not present

## 2022-03-17 DIAGNOSIS — Z89512 Acquired absence of left leg below knee: Secondary | ICD-10-CM | POA: Diagnosis not present

## 2022-03-17 DIAGNOSIS — I739 Peripheral vascular disease, unspecified: Secondary | ICD-10-CM | POA: Diagnosis not present

## 2022-03-17 DIAGNOSIS — I5022 Chronic systolic (congestive) heart failure: Secondary | ICD-10-CM | POA: Diagnosis not present

## 2022-03-17 DIAGNOSIS — I2510 Atherosclerotic heart disease of native coronary artery without angina pectoris: Secondary | ICD-10-CM | POA: Diagnosis not present

## 2022-03-17 DIAGNOSIS — R531 Weakness: Secondary | ICD-10-CM | POA: Diagnosis not present

## 2022-03-19 NOTE — Telephone Encounter (Signed)
Form was faxed and a copy was sent to Lena's email at her request.  Keyron Pokorski,cma

## 2022-03-20 ENCOUNTER — Telehealth: Payer: Self-pay

## 2022-03-20 ENCOUNTER — Other Ambulatory Visit: Payer: Self-pay

## 2022-03-20 DIAGNOSIS — Z9181 History of falling: Secondary | ICD-10-CM | POA: Diagnosis not present

## 2022-03-20 DIAGNOSIS — Z7902 Long term (current) use of antithrombotics/antiplatelets: Secondary | ICD-10-CM | POA: Diagnosis not present

## 2022-03-20 DIAGNOSIS — Z7982 Long term (current) use of aspirin: Secondary | ICD-10-CM | POA: Diagnosis not present

## 2022-03-20 DIAGNOSIS — Z8673 Personal history of transient ischemic attack (TIA), and cerebral infarction without residual deficits: Secondary | ICD-10-CM | POA: Diagnosis not present

## 2022-03-20 DIAGNOSIS — Z7951 Long term (current) use of inhaled steroids: Secondary | ICD-10-CM | POA: Diagnosis not present

## 2022-03-20 DIAGNOSIS — I10 Essential (primary) hypertension: Secondary | ICD-10-CM | POA: Diagnosis not present

## 2022-03-20 DIAGNOSIS — Z87891 Personal history of nicotine dependence: Secondary | ICD-10-CM | POA: Diagnosis not present

## 2022-03-20 DIAGNOSIS — N183 Chronic kidney disease, stage 3 unspecified: Secondary | ICD-10-CM

## 2022-03-20 DIAGNOSIS — Z89512 Acquired absence of left leg below knee: Secondary | ICD-10-CM | POA: Diagnosis not present

## 2022-03-20 DIAGNOSIS — Z7984 Long term (current) use of oral hypoglycemic drugs: Secondary | ICD-10-CM | POA: Diagnosis not present

## 2022-03-20 DIAGNOSIS — Z794 Long term (current) use of insulin: Secondary | ICD-10-CM | POA: Diagnosis not present

## 2022-03-20 DIAGNOSIS — J432 Centrilobular emphysema: Secondary | ICD-10-CM | POA: Diagnosis not present

## 2022-03-20 DIAGNOSIS — E1165 Type 2 diabetes mellitus with hyperglycemia: Secondary | ICD-10-CM | POA: Diagnosis not present

## 2022-03-20 MED ORDER — FREESTYLE LIBRE 2 READER DEVI: 1.0000 | Freq: Four times a day (QID) | 0 refills | Status: DC

## 2022-03-20 NOTE — Telephone Encounter (Signed)
Thomasenia Sales called from [facility] to state patient fell on Sunday (03/18/2022) while trying to move from his bed to his wheelchair.  Thomasenia Sales states patient has bruises on upper extremities, right and left.  Thomasenia Sales states patient is on blood thinners and patient states he is not injured.  Thomasenia Sales states she advocated patient on safe transferring.  Thomasenia Sales states patient does not currently have any physical therapy because their therapy department states patient declined physical therapy.  Thomasenia Sales states patient states he did not decline physical therapy.  Thomasenia Sales states she would like for [place] to call her regarding patient's glucometer.  Thomasenia Sales states she asked for a new device and they sent her refills on the sensors.

## 2022-03-20 NOTE — Telephone Encounter (Signed)
New device was sent to the patients pharmacy.  Patient stated he did not decline PT and needs a new referral.  Viviane Semidey,cma

## 2022-03-21 NOTE — Telephone Encounter (Signed)
FYI patient scheduled 04/16/22 at 11. That okay or did you want sooner?

## 2022-03-21 NOTE — Telephone Encounter (Signed)
Sooner would be better though I do not know if we have anything available prior to then.

## 2022-03-21 NOTE — Telephone Encounter (Signed)
Noted.  He needs a face-to-face visit with me so that I can order home health PT given how long its been since I last saw him in person.  Please get him scheduled.  Thanks.

## 2022-03-22 DIAGNOSIS — Z7902 Long term (current) use of antithrombotics/antiplatelets: Secondary | ICD-10-CM | POA: Diagnosis not present

## 2022-03-22 DIAGNOSIS — Z89512 Acquired absence of left leg below knee: Secondary | ICD-10-CM | POA: Diagnosis not present

## 2022-03-22 DIAGNOSIS — Z794 Long term (current) use of insulin: Secondary | ICD-10-CM | POA: Diagnosis not present

## 2022-03-22 DIAGNOSIS — Z7951 Long term (current) use of inhaled steroids: Secondary | ICD-10-CM | POA: Diagnosis not present

## 2022-03-22 DIAGNOSIS — Z7984 Long term (current) use of oral hypoglycemic drugs: Secondary | ICD-10-CM | POA: Diagnosis not present

## 2022-03-22 DIAGNOSIS — Z9181 History of falling: Secondary | ICD-10-CM | POA: Diagnosis not present

## 2022-03-22 DIAGNOSIS — Z8673 Personal history of transient ischemic attack (TIA), and cerebral infarction without residual deficits: Secondary | ICD-10-CM | POA: Diagnosis not present

## 2022-03-22 DIAGNOSIS — Z7982 Long term (current) use of aspirin: Secondary | ICD-10-CM | POA: Diagnosis not present

## 2022-03-22 DIAGNOSIS — Z87891 Personal history of nicotine dependence: Secondary | ICD-10-CM | POA: Diagnosis not present

## 2022-03-22 DIAGNOSIS — E1165 Type 2 diabetes mellitus with hyperglycemia: Secondary | ICD-10-CM | POA: Diagnosis not present

## 2022-03-22 DIAGNOSIS — J432 Centrilobular emphysema: Secondary | ICD-10-CM | POA: Diagnosis not present

## 2022-03-22 DIAGNOSIS — I10 Essential (primary) hypertension: Secondary | ICD-10-CM | POA: Diagnosis not present

## 2022-03-22 NOTE — Telephone Encounter (Signed)
Kwasi from [facility] home health called stating section 3 of the activity for daily living form is not filled out correctly Phone [phone] Fax [phone]

## 2022-03-22 NOTE — Telephone Encounter (Signed)
I could use hospital follow but up to your discretion?

## 2022-03-23 NOTE — Telephone Encounter (Signed)
I called and spoke with patient's daughter Arline Asp. She stated that she will have her brother that lives with patient to call us so we could move up patient's appointment.

## 2022-03-26 DIAGNOSIS — Z7951 Long term (current) use of inhaled steroids: Secondary | ICD-10-CM | POA: Diagnosis not present

## 2022-03-26 DIAGNOSIS — Z8673 Personal history of transient ischemic attack (TIA), and cerebral infarction without residual deficits: Secondary | ICD-10-CM | POA: Diagnosis not present

## 2022-03-26 DIAGNOSIS — Z9181 History of falling: Secondary | ICD-10-CM | POA: Diagnosis not present

## 2022-03-26 DIAGNOSIS — Z7902 Long term (current) use of antithrombotics/antiplatelets: Secondary | ICD-10-CM | POA: Diagnosis not present

## 2022-03-26 DIAGNOSIS — Z7984 Long term (current) use of oral hypoglycemic drugs: Secondary | ICD-10-CM | POA: Diagnosis not present

## 2022-03-26 DIAGNOSIS — Z7982 Long term (current) use of aspirin: Secondary | ICD-10-CM | POA: Diagnosis not present

## 2022-03-26 DIAGNOSIS — Z794 Long term (current) use of insulin: Secondary | ICD-10-CM | POA: Diagnosis not present

## 2022-03-26 DIAGNOSIS — J432 Centrilobular emphysema: Secondary | ICD-10-CM | POA: Diagnosis not present

## 2022-03-26 DIAGNOSIS — E1165 Type 2 diabetes mellitus with hyperglycemia: Secondary | ICD-10-CM | POA: Diagnosis not present

## 2022-03-26 DIAGNOSIS — Z89512 Acquired absence of left leg below knee: Secondary | ICD-10-CM | POA: Diagnosis not present

## 2022-03-26 DIAGNOSIS — Z87891 Personal history of nicotine dependence: Secondary | ICD-10-CM | POA: Diagnosis not present

## 2022-03-26 DIAGNOSIS — I10 Essential (primary) hypertension: Secondary | ICD-10-CM | POA: Diagnosis not present

## 2022-03-28 DIAGNOSIS — E1165 Type 2 diabetes mellitus with hyperglycemia: Secondary | ICD-10-CM | POA: Diagnosis not present

## 2022-03-28 DIAGNOSIS — J432 Centrilobular emphysema: Secondary | ICD-10-CM | POA: Diagnosis not present

## 2022-03-28 DIAGNOSIS — Z7982 Long term (current) use of aspirin: Secondary | ICD-10-CM | POA: Diagnosis not present

## 2022-03-28 DIAGNOSIS — Z7984 Long term (current) use of oral hypoglycemic drugs: Secondary | ICD-10-CM | POA: Diagnosis not present

## 2022-03-28 DIAGNOSIS — Z7951 Long term (current) use of inhaled steroids: Secondary | ICD-10-CM | POA: Diagnosis not present

## 2022-03-28 DIAGNOSIS — Z7902 Long term (current) use of antithrombotics/antiplatelets: Secondary | ICD-10-CM | POA: Diagnosis not present

## 2022-03-28 DIAGNOSIS — Z89512 Acquired absence of left leg below knee: Secondary | ICD-10-CM | POA: Diagnosis not present

## 2022-03-28 DIAGNOSIS — Z87891 Personal history of nicotine dependence: Secondary | ICD-10-CM | POA: Diagnosis not present

## 2022-03-28 DIAGNOSIS — Z8673 Personal history of transient ischemic attack (TIA), and cerebral infarction without residual deficits: Secondary | ICD-10-CM | POA: Diagnosis not present

## 2022-03-28 DIAGNOSIS — I10 Essential (primary) hypertension: Secondary | ICD-10-CM | POA: Diagnosis not present

## 2022-03-28 DIAGNOSIS — Z9181 History of falling: Secondary | ICD-10-CM | POA: Diagnosis not present

## 2022-03-28 DIAGNOSIS — Z794 Long term (current) use of insulin: Secondary | ICD-10-CM | POA: Diagnosis not present

## 2022-03-29 DIAGNOSIS — Z7902 Long term (current) use of antithrombotics/antiplatelets: Secondary | ICD-10-CM | POA: Diagnosis not present

## 2022-03-29 DIAGNOSIS — Z7951 Long term (current) use of inhaled steroids: Secondary | ICD-10-CM | POA: Diagnosis not present

## 2022-03-29 DIAGNOSIS — Z7982 Long term (current) use of aspirin: Secondary | ICD-10-CM | POA: Diagnosis not present

## 2022-03-29 DIAGNOSIS — J432 Centrilobular emphysema: Secondary | ICD-10-CM | POA: Diagnosis not present

## 2022-03-29 DIAGNOSIS — Z7984 Long term (current) use of oral hypoglycemic drugs: Secondary | ICD-10-CM | POA: Diagnosis not present

## 2022-03-29 DIAGNOSIS — I10 Essential (primary) hypertension: Secondary | ICD-10-CM | POA: Diagnosis not present

## 2022-03-29 DIAGNOSIS — E1165 Type 2 diabetes mellitus with hyperglycemia: Secondary | ICD-10-CM | POA: Diagnosis not present

## 2022-03-29 DIAGNOSIS — Z89512 Acquired absence of left leg below knee: Secondary | ICD-10-CM | POA: Diagnosis not present

## 2022-03-29 DIAGNOSIS — Z9181 History of falling: Secondary | ICD-10-CM | POA: Diagnosis not present

## 2022-03-29 DIAGNOSIS — Z794 Long term (current) use of insulin: Secondary | ICD-10-CM | POA: Diagnosis not present

## 2022-03-29 DIAGNOSIS — Z87891 Personal history of nicotine dependence: Secondary | ICD-10-CM | POA: Diagnosis not present

## 2022-03-29 DIAGNOSIS — Z8673 Personal history of transient ischemic attack (TIA), and cerebral infarction without residual deficits: Secondary | ICD-10-CM | POA: Diagnosis not present

## 2022-04-02 DIAGNOSIS — Z7982 Long term (current) use of aspirin: Secondary | ICD-10-CM | POA: Diagnosis not present

## 2022-04-02 DIAGNOSIS — Z7951 Long term (current) use of inhaled steroids: Secondary | ICD-10-CM | POA: Diagnosis not present

## 2022-04-02 DIAGNOSIS — Z7902 Long term (current) use of antithrombotics/antiplatelets: Secondary | ICD-10-CM | POA: Diagnosis not present

## 2022-04-02 DIAGNOSIS — E1165 Type 2 diabetes mellitus with hyperglycemia: Secondary | ICD-10-CM | POA: Diagnosis not present

## 2022-04-02 DIAGNOSIS — J432 Centrilobular emphysema: Secondary | ICD-10-CM | POA: Diagnosis not present

## 2022-04-02 DIAGNOSIS — Z87891 Personal history of nicotine dependence: Secondary | ICD-10-CM | POA: Diagnosis not present

## 2022-04-02 DIAGNOSIS — Z9181 History of falling: Secondary | ICD-10-CM | POA: Diagnosis not present

## 2022-04-02 DIAGNOSIS — Z8673 Personal history of transient ischemic attack (TIA), and cerebral infarction without residual deficits: Secondary | ICD-10-CM | POA: Diagnosis not present

## 2022-04-02 DIAGNOSIS — Z794 Long term (current) use of insulin: Secondary | ICD-10-CM | POA: Diagnosis not present

## 2022-04-02 DIAGNOSIS — Z89512 Acquired absence of left leg below knee: Secondary | ICD-10-CM | POA: Diagnosis not present

## 2022-04-02 DIAGNOSIS — I10 Essential (primary) hypertension: Secondary | ICD-10-CM | POA: Diagnosis not present

## 2022-04-02 DIAGNOSIS — Z7984 Long term (current) use of oral hypoglycemic drugs: Secondary | ICD-10-CM | POA: Diagnosis not present

## 2022-04-03 NOTE — Telephone Encounter (Signed)
Filled out the ADL's and refax to [facility]. Confirmation given. Margarette Vannatter,cma

## 2022-04-04 DIAGNOSIS — Z87891 Personal history of nicotine dependence: Secondary | ICD-10-CM | POA: Diagnosis not present

## 2022-04-04 DIAGNOSIS — Z7902 Long term (current) use of antithrombotics/antiplatelets: Secondary | ICD-10-CM | POA: Diagnosis not present

## 2022-04-04 DIAGNOSIS — Z8673 Personal history of transient ischemic attack (TIA), and cerebral infarction without residual deficits: Secondary | ICD-10-CM | POA: Diagnosis not present

## 2022-04-04 DIAGNOSIS — J432 Centrilobular emphysema: Secondary | ICD-10-CM | POA: Diagnosis not present

## 2022-04-04 DIAGNOSIS — Z89512 Acquired absence of left leg below knee: Secondary | ICD-10-CM | POA: Diagnosis not present

## 2022-04-04 DIAGNOSIS — Z794 Long term (current) use of insulin: Secondary | ICD-10-CM | POA: Diagnosis not present

## 2022-04-04 DIAGNOSIS — Z7982 Long term (current) use of aspirin: Secondary | ICD-10-CM | POA: Diagnosis not present

## 2022-04-04 DIAGNOSIS — E1165 Type 2 diabetes mellitus with hyperglycemia: Secondary | ICD-10-CM | POA: Diagnosis not present

## 2022-04-04 DIAGNOSIS — Z9181 History of falling: Secondary | ICD-10-CM | POA: Diagnosis not present

## 2022-04-04 DIAGNOSIS — I10 Essential (primary) hypertension: Secondary | ICD-10-CM | POA: Diagnosis not present

## 2022-04-04 DIAGNOSIS — Z7984 Long term (current) use of oral hypoglycemic drugs: Secondary | ICD-10-CM | POA: Diagnosis not present

## 2022-04-04 DIAGNOSIS — Z7951 Long term (current) use of inhaled steroids: Secondary | ICD-10-CM | POA: Diagnosis not present

## 2022-04-05 ENCOUNTER — Other Ambulatory Visit (INDEPENDENT_AMBULATORY_CARE_PROVIDER_SITE_OTHER): Payer: Self-pay | Admitting: Vascular Surgery

## 2022-04-05 DIAGNOSIS — Z794 Long term (current) use of insulin: Secondary | ICD-10-CM | POA: Diagnosis not present

## 2022-04-05 DIAGNOSIS — Z7982 Long term (current) use of aspirin: Secondary | ICD-10-CM | POA: Diagnosis not present

## 2022-04-05 DIAGNOSIS — Z7984 Long term (current) use of oral hypoglycemic drugs: Secondary | ICD-10-CM | POA: Diagnosis not present

## 2022-04-05 DIAGNOSIS — I10 Essential (primary) hypertension: Secondary | ICD-10-CM | POA: Diagnosis not present

## 2022-04-05 DIAGNOSIS — Z89512 Acquired absence of left leg below knee: Secondary | ICD-10-CM | POA: Diagnosis not present

## 2022-04-05 DIAGNOSIS — Z7902 Long term (current) use of antithrombotics/antiplatelets: Secondary | ICD-10-CM | POA: Diagnosis not present

## 2022-04-05 DIAGNOSIS — I739 Peripheral vascular disease, unspecified: Secondary | ICD-10-CM

## 2022-04-05 DIAGNOSIS — Z9181 History of falling: Secondary | ICD-10-CM | POA: Diagnosis not present

## 2022-04-05 DIAGNOSIS — J432 Centrilobular emphysema: Secondary | ICD-10-CM | POA: Diagnosis not present

## 2022-04-05 DIAGNOSIS — Z8673 Personal history of transient ischemic attack (TIA), and cerebral infarction without residual deficits: Secondary | ICD-10-CM | POA: Diagnosis not present

## 2022-04-05 DIAGNOSIS — E1165 Type 2 diabetes mellitus with hyperglycemia: Secondary | ICD-10-CM | POA: Diagnosis not present

## 2022-04-05 DIAGNOSIS — Z87891 Personal history of nicotine dependence: Secondary | ICD-10-CM | POA: Diagnosis not present

## 2022-04-05 DIAGNOSIS — Z7951 Long term (current) use of inhaled steroids: Secondary | ICD-10-CM | POA: Diagnosis not present

## 2022-04-10 ENCOUNTER — Ambulatory Visit (INDEPENDENT_AMBULATORY_CARE_PROVIDER_SITE_OTHER): Payer: Medicare Other | Admitting: Nurse Practitioner

## 2022-04-10 ENCOUNTER — Encounter (INDEPENDENT_AMBULATORY_CARE_PROVIDER_SITE_OTHER): Payer: Medicare Other

## 2022-04-10 DIAGNOSIS — I10 Essential (primary) hypertension: Secondary | ICD-10-CM | POA: Diagnosis not present

## 2022-04-10 DIAGNOSIS — Z89512 Acquired absence of left leg below knee: Secondary | ICD-10-CM | POA: Diagnosis not present

## 2022-04-10 DIAGNOSIS — J432 Centrilobular emphysema: Secondary | ICD-10-CM | POA: Diagnosis not present

## 2022-04-10 DIAGNOSIS — Z7982 Long term (current) use of aspirin: Secondary | ICD-10-CM | POA: Diagnosis not present

## 2022-04-10 DIAGNOSIS — Z7984 Long term (current) use of oral hypoglycemic drugs: Secondary | ICD-10-CM | POA: Diagnosis not present

## 2022-04-10 DIAGNOSIS — E1165 Type 2 diabetes mellitus with hyperglycemia: Secondary | ICD-10-CM | POA: Diagnosis not present

## 2022-04-10 DIAGNOSIS — Z87891 Personal history of nicotine dependence: Secondary | ICD-10-CM | POA: Diagnosis not present

## 2022-04-10 DIAGNOSIS — Z7951 Long term (current) use of inhaled steroids: Secondary | ICD-10-CM | POA: Diagnosis not present

## 2022-04-10 DIAGNOSIS — Z7902 Long term (current) use of antithrombotics/antiplatelets: Secondary | ICD-10-CM | POA: Diagnosis not present

## 2022-04-10 DIAGNOSIS — Z794 Long term (current) use of insulin: Secondary | ICD-10-CM | POA: Diagnosis not present

## 2022-04-10 DIAGNOSIS — Z9181 History of falling: Secondary | ICD-10-CM | POA: Diagnosis not present

## 2022-04-10 DIAGNOSIS — Z8673 Personal history of transient ischemic attack (TIA), and cerebral infarction without residual deficits: Secondary | ICD-10-CM | POA: Diagnosis not present

## 2022-04-16 ENCOUNTER — Ambulatory Visit: Payer: Medicare Other | Admitting: Family Medicine

## 2022-04-16 DIAGNOSIS — Z794 Long term (current) use of insulin: Secondary | ICD-10-CM | POA: Diagnosis not present

## 2022-04-16 DIAGNOSIS — Z7951 Long term (current) use of inhaled steroids: Secondary | ICD-10-CM | POA: Diagnosis not present

## 2022-04-16 DIAGNOSIS — Z9181 History of falling: Secondary | ICD-10-CM | POA: Diagnosis not present

## 2022-04-16 DIAGNOSIS — Z7902 Long term (current) use of antithrombotics/antiplatelets: Secondary | ICD-10-CM | POA: Diagnosis not present

## 2022-04-16 DIAGNOSIS — Z89512 Acquired absence of left leg below knee: Secondary | ICD-10-CM | POA: Diagnosis not present

## 2022-04-16 DIAGNOSIS — Z87891 Personal history of nicotine dependence: Secondary | ICD-10-CM | POA: Diagnosis not present

## 2022-04-16 DIAGNOSIS — Z8673 Personal history of transient ischemic attack (TIA), and cerebral infarction without residual deficits: Secondary | ICD-10-CM | POA: Diagnosis not present

## 2022-04-16 DIAGNOSIS — I10 Essential (primary) hypertension: Secondary | ICD-10-CM | POA: Diagnosis not present

## 2022-04-16 DIAGNOSIS — J432 Centrilobular emphysema: Secondary | ICD-10-CM | POA: Diagnosis not present

## 2022-04-16 DIAGNOSIS — E1165 Type 2 diabetes mellitus with hyperglycemia: Secondary | ICD-10-CM | POA: Diagnosis not present

## 2022-04-16 DIAGNOSIS — Z7982 Long term (current) use of aspirin: Secondary | ICD-10-CM | POA: Diagnosis not present

## 2022-04-16 DIAGNOSIS — Z7984 Long term (current) use of oral hypoglycemic drugs: Secondary | ICD-10-CM | POA: Diagnosis not present

## 2022-04-17 DIAGNOSIS — I739 Peripheral vascular disease, unspecified: Secondary | ICD-10-CM | POA: Diagnosis not present

## 2022-04-17 DIAGNOSIS — J449 Chronic obstructive pulmonary disease, unspecified: Secondary | ICD-10-CM | POA: Diagnosis not present

## 2022-04-17 DIAGNOSIS — I2510 Atherosclerotic heart disease of native coronary artery without angina pectoris: Secondary | ICD-10-CM | POA: Diagnosis not present

## 2022-04-17 DIAGNOSIS — N179 Acute kidney failure, unspecified: Secondary | ICD-10-CM | POA: Diagnosis not present

## 2022-04-17 DIAGNOSIS — Z89512 Acquired absence of left leg below knee: Secondary | ICD-10-CM | POA: Diagnosis not present

## 2022-04-17 DIAGNOSIS — R531 Weakness: Secondary | ICD-10-CM | POA: Diagnosis not present

## 2022-04-17 DIAGNOSIS — I5022 Chronic systolic (congestive) heart failure: Secondary | ICD-10-CM | POA: Diagnosis not present

## 2022-04-19 DIAGNOSIS — I10 Essential (primary) hypertension: Secondary | ICD-10-CM | POA: Diagnosis not present

## 2022-04-19 DIAGNOSIS — Z7902 Long term (current) use of antithrombotics/antiplatelets: Secondary | ICD-10-CM | POA: Diagnosis not present

## 2022-04-19 DIAGNOSIS — J432 Centrilobular emphysema: Secondary | ICD-10-CM | POA: Diagnosis not present

## 2022-04-19 DIAGNOSIS — Z7951 Long term (current) use of inhaled steroids: Secondary | ICD-10-CM | POA: Diagnosis not present

## 2022-04-19 DIAGNOSIS — Z8673 Personal history of transient ischemic attack (TIA), and cerebral infarction without residual deficits: Secondary | ICD-10-CM | POA: Diagnosis not present

## 2022-04-19 DIAGNOSIS — Z9181 History of falling: Secondary | ICD-10-CM | POA: Diagnosis not present

## 2022-04-19 DIAGNOSIS — E1165 Type 2 diabetes mellitus with hyperglycemia: Secondary | ICD-10-CM | POA: Diagnosis not present

## 2022-04-19 DIAGNOSIS — Z794 Long term (current) use of insulin: Secondary | ICD-10-CM | POA: Diagnosis not present

## 2022-04-19 DIAGNOSIS — Z89512 Acquired absence of left leg below knee: Secondary | ICD-10-CM | POA: Diagnosis not present

## 2022-04-19 DIAGNOSIS — Z7982 Long term (current) use of aspirin: Secondary | ICD-10-CM | POA: Diagnosis not present

## 2022-04-19 DIAGNOSIS — Z87891 Personal history of nicotine dependence: Secondary | ICD-10-CM | POA: Diagnosis not present

## 2022-04-19 DIAGNOSIS — Z7984 Long term (current) use of oral hypoglycemic drugs: Secondary | ICD-10-CM | POA: Diagnosis not present

## 2022-04-23 DIAGNOSIS — J432 Centrilobular emphysema: Secondary | ICD-10-CM | POA: Diagnosis not present

## 2022-04-23 DIAGNOSIS — E1165 Type 2 diabetes mellitus with hyperglycemia: Secondary | ICD-10-CM | POA: Diagnosis not present

## 2022-04-23 DIAGNOSIS — Z9181 History of falling: Secondary | ICD-10-CM | POA: Diagnosis not present

## 2022-04-23 DIAGNOSIS — I10 Essential (primary) hypertension: Secondary | ICD-10-CM | POA: Diagnosis not present

## 2022-04-23 DIAGNOSIS — Z7902 Long term (current) use of antithrombotics/antiplatelets: Secondary | ICD-10-CM | POA: Diagnosis not present

## 2022-04-23 DIAGNOSIS — Z8673 Personal history of transient ischemic attack (TIA), and cerebral infarction without residual deficits: Secondary | ICD-10-CM | POA: Diagnosis not present

## 2022-04-23 DIAGNOSIS — Z794 Long term (current) use of insulin: Secondary | ICD-10-CM | POA: Diagnosis not present

## 2022-04-23 DIAGNOSIS — Z7984 Long term (current) use of oral hypoglycemic drugs: Secondary | ICD-10-CM | POA: Diagnosis not present

## 2022-04-23 DIAGNOSIS — Z89512 Acquired absence of left leg below knee: Secondary | ICD-10-CM | POA: Diagnosis not present

## 2022-04-23 DIAGNOSIS — Z7982 Long term (current) use of aspirin: Secondary | ICD-10-CM | POA: Diagnosis not present

## 2022-04-23 DIAGNOSIS — Z7951 Long term (current) use of inhaled steroids: Secondary | ICD-10-CM | POA: Diagnosis not present

## 2022-04-23 DIAGNOSIS — Z87891 Personal history of nicotine dependence: Secondary | ICD-10-CM | POA: Diagnosis not present

## 2022-04-24 DIAGNOSIS — M6281 Muscle weakness (generalized): Secondary | ICD-10-CM | POA: Diagnosis not present

## 2022-04-24 DIAGNOSIS — I619 Nontraumatic intracerebral hemorrhage, unspecified: Secondary | ICD-10-CM | POA: Diagnosis not present

## 2022-04-26 DIAGNOSIS — Z7984 Long term (current) use of oral hypoglycemic drugs: Secondary | ICD-10-CM | POA: Diagnosis not present

## 2022-04-26 DIAGNOSIS — Z794 Long term (current) use of insulin: Secondary | ICD-10-CM | POA: Diagnosis not present

## 2022-04-26 DIAGNOSIS — Z7951 Long term (current) use of inhaled steroids: Secondary | ICD-10-CM | POA: Diagnosis not present

## 2022-04-26 DIAGNOSIS — Z8673 Personal history of transient ischemic attack (TIA), and cerebral infarction without residual deficits: Secondary | ICD-10-CM | POA: Diagnosis not present

## 2022-04-26 DIAGNOSIS — Z7902 Long term (current) use of antithrombotics/antiplatelets: Secondary | ICD-10-CM | POA: Diagnosis not present

## 2022-04-26 DIAGNOSIS — E1165 Type 2 diabetes mellitus with hyperglycemia: Secondary | ICD-10-CM | POA: Diagnosis not present

## 2022-04-26 DIAGNOSIS — Z9181 History of falling: Secondary | ICD-10-CM | POA: Diagnosis not present

## 2022-04-26 DIAGNOSIS — Z89512 Acquired absence of left leg below knee: Secondary | ICD-10-CM | POA: Diagnosis not present

## 2022-04-26 DIAGNOSIS — I10 Essential (primary) hypertension: Secondary | ICD-10-CM | POA: Diagnosis not present

## 2022-04-26 DIAGNOSIS — Z7982 Long term (current) use of aspirin: Secondary | ICD-10-CM | POA: Diagnosis not present

## 2022-04-26 DIAGNOSIS — J432 Centrilobular emphysema: Secondary | ICD-10-CM | POA: Diagnosis not present

## 2022-04-26 DIAGNOSIS — Z87891 Personal history of nicotine dependence: Secondary | ICD-10-CM | POA: Diagnosis not present

## 2022-04-27 ENCOUNTER — Encounter: Payer: Self-pay | Admitting: Family Medicine

## 2022-04-27 ENCOUNTER — Other Ambulatory Visit (INDEPENDENT_AMBULATORY_CARE_PROVIDER_SITE_OTHER): Payer: Medicare Other

## 2022-04-27 ENCOUNTER — Ambulatory Visit (INDEPENDENT_AMBULATORY_CARE_PROVIDER_SITE_OTHER): Payer: Medicare Other | Admitting: Family Medicine

## 2022-04-27 VITALS — BP 130/70 | HR 91 | Temp 97.8°F | Ht 72.0 in | Wt 236.0 lb

## 2022-04-27 DIAGNOSIS — Z89512 Acquired absence of left leg below knee: Secondary | ICD-10-CM | POA: Diagnosis not present

## 2022-04-27 DIAGNOSIS — J449 Chronic obstructive pulmonary disease, unspecified: Secondary | ICD-10-CM

## 2022-04-27 DIAGNOSIS — E785 Hyperlipidemia, unspecified: Secondary | ICD-10-CM

## 2022-04-27 DIAGNOSIS — Z23 Encounter for immunization: Secondary | ICD-10-CM | POA: Diagnosis not present

## 2022-04-27 DIAGNOSIS — I5032 Chronic diastolic (congestive) heart failure: Secondary | ICD-10-CM | POA: Diagnosis not present

## 2022-04-27 DIAGNOSIS — I2510 Atherosclerotic heart disease of native coronary artery without angina pectoris: Secondary | ICD-10-CM

## 2022-04-27 DIAGNOSIS — E1169 Type 2 diabetes mellitus with other specified complication: Secondary | ICD-10-CM | POA: Diagnosis not present

## 2022-04-27 DIAGNOSIS — I10 Essential (primary) hypertension: Secondary | ICD-10-CM | POA: Diagnosis not present

## 2022-04-27 DIAGNOSIS — J4489 Other specified chronic obstructive pulmonary disease: Secondary | ICD-10-CM

## 2022-04-27 DIAGNOSIS — R3981 Functional urinary incontinence: Secondary | ICD-10-CM

## 2022-04-27 LAB — BASIC METABOLIC PANEL
BUN: 52 mg/dL — ABNORMAL HIGH (ref 6–23)
CO2: 24 mEq/L (ref 19–32)
Calcium: 9 mg/dL (ref 8.4–10.5)
Chloride: 106 mEq/L (ref 96–112)
Creatinine, Ser: 1.43 mg/dL (ref 0.40–1.50)
GFR: 49.38 mL/min — ABNORMAL LOW (ref >60.00)
Glucose, Bld: 76 mg/dL (ref 70–99)
Potassium: 4 mEq/L (ref 3.5–5.1)
Sodium: 140 mEq/L (ref 135–145)

## 2022-04-27 MED ORDER — AMLODIPINE BESYLATE 5 MG PO TABS: 5.0000 mg | ORAL_TABLET | Freq: Every day | ORAL | 0 refills | Status: DC

## 2022-04-27 MED ORDER — FREESTYLE LIBRE 2 SENSOR MISC: 0 refills | Status: DC

## 2022-04-27 MED ORDER — FREESTYLE LIBRE 2 READER DEVI: 1.0000 | Freq: Four times a day (QID) | 0 refills | Status: DC

## 2022-04-27 NOTE — Progress Notes (Signed)
Marikay Alar, MD Phone: [phone]  Danny Friends Degen Sr. is a 71 y.o. male who presents today for f/u.  DIABETES Disease Monitoring: Blood Sugar ranges-libre has not been working Polyuria/phagia/dipsia- no      Optho- due Medications: Compliance- taking trulicity, jardiance, sliding scale novolog, metformin Hypoglycemic symptoms- no  CHF/CAD: Patient is on spironolactone, Entresto, Lasix, Plavix, and Coreg.  He has chest pain that occurs centrally in his chest once every 2 weeks.  He takes nitroglycerin and that resolves the chest pain.  He has chronic dyspnea that may be slightly worse than usual though this improves with albuterol use.  He has not been using his Advair.  He does note occasional swelling in his right lower leg.  He is status post left BKA.  Left BKA: Patient's son notes he has the appropriate supplies for this though they are unsure if it fits appropriately as he does have some swelling in the stump at times.  Incontinence: They note he has urinary and bladder incontinence at times because he cannot get to the bathroom quickly enough in the setting of his BKA.  They wonder about incontinence supplies.  Social History   Tobacco Use  Smoking Status Former   Packs/day: 3.00   Years: 0.00   Total pack years: 0.00   Types: Cigarettes   Quit date: 09/07/2009   Years since quitting: 12.6  Smokeless Tobacco Never  Tobacco Comments   quit june 2011    Current Outpatient Medications on File Prior to Visit  Medication Sig Dispense Refill   acetaminophen (TYLENOL) 325 MG tablet Take 2 tablets (650 mg total) by mouth every 4 (four) hours as needed for mild pain (or temp > 37.5 C (99.5 F)).     albuterol (PROVENTIL) (2.5 MG/3ML) 0.083% nebulizer solution Take 3 mLs (2.5 mg total) by nebulization every 6 (six) hours as needed for wheezing or shortness of breath. 150 mL 1   Albuterol Sulfate (PROAIR RESPICLICK) 108 (90 Base) MCG/ACT AEPB Inhale 1 puff into the lungs every 6  (six) hours as needed. 1 each 12   aspirin EC 81 MG tablet Take 1 tablet (81 mg total) by mouth daily. 150 tablet 2   atorvastatin (LIPITOR) 80 MG tablet Take 1 tablet (80 mg total) by mouth daily. 90 tablet 3   butalbital-acetaminophen-caffeine (FIORICET) 50-325-40 MG tablet Take 1 tablet by mouth every 8 (eight) hours as needed for headache. 14 tablet 0   carvedilol (COREG) 25 MG tablet TAKE 1 TABLET BY MOUTH TWICE  DAILY WITH MEALS 60 tablet 11   clopidogrel (PLAVIX) 75 MG tablet Take 1 tablet (75 mg total) by mouth daily. 30 tablet 11   Dulaglutide (TRULICITY) 4.5 MG/0.5ML SOPN Inject 4.5 mg as directed once a week. 6 mL 3   empagliflozin (JARDIANCE) 25 MG TABS tablet Take 1 tablet (25 mg total) by mouth daily. 90 tablet 3   escitalopram (LEXAPRO) 10 MG tablet Take 1 tablet (10 mg total) by mouth daily. 90 tablet 1   fluticasone-salmeterol (ADVAIR) 250-50 MCG/ACT AEPB Inhale 1 puff into the lungs 2 (two) times daily. 3 each 1   furosemide (LASIX) 20 MG tablet TAKE 1 TABLET (20MG ) BY MOUTH ONCE EVERY OTHER DAY. 45 tablet 3   gabapentin (NEURONTIN) 300 MG capsule Take 1 capsule (300 mg total) by mouth at bedtime. 30 capsule 3   HYDROcodone-acetaminophen (NORCO/VICODIN) 5-325 MG tablet Take 1 tablet by mouth every 4 (four) hours as needed for moderate pain. 20 tablet 0  insulin aspart (NOVOLOG) 100 UNIT/ML injection Inject 0-15 Units into the skin 4 (four) times daily -  before meals and at bedtime. CBG < 70: Implement Hypoglycemia Standing Orders and refer to Hypoglycemia Standing Orders sidebar report CBG 70 - 120: 0 units CBG 121 - 150: 2 units CBG 151 - 200: 3 units CBG 201 - 250: 5 units CBG 251 - 300: 8 units CBG 301 - 350: 11 units CBG 351 - 400: 15 units CBG > 400: call MD 10 mL 11   isosorbide mononitrate (IMDUR) 30 MG 24 hr tablet TAKE 1 TABLET BY MOUTH EVERY DAY 90 tablet 3   metFORMIN (GLUCOPHAGE-XR) 500 MG 24 hr tablet Take 1 tablet (500 mg total) by mouth 2 (two) times daily with a  meal. (Patient taking differently: Take 500 mg by mouth daily with breakfast. Pt reports "tears my stomach up if I take more than once a day") 180 tablet 1   nitroGLYCERIN (NITROSTAT) 0.4 MG SL tablet Place 0.4 mg under the tongue every 5 (five) minutes as needed for chest pain.     ondansetron (ZOFRAN) 4 MG tablet Take 4 mg by mouth every 6 (six) hours as needed.     ondansetron (ZOFRAN-ODT) 4 MG disintegrating tablet Allow 1-2 tablets to dissolve in your mouth every 8 hours as needed for nausea/vomiting 30 tablet 0   sacubitril-valsartan (ENTRESTO) 49-51 MG TAKE 1 TABLET BY MOUTH 2 (TWO) TIMES DAILY. STOP LISINOPRIL AND POTASSIUM 180 tablet 1   spironolactone (ALDACTONE) 25 MG tablet Take 1 tablet (25 mg total) by mouth daily. 90 tablet 1   tamsulosin (FLOMAX) 0.4 MG CAPS capsule Take 1 capsule (0.4 mg total) by mouth daily. 90 capsule 1   No current facility-administered medications on file prior to visit.     ROS see history of present illness  Objective  Physical Exam Vitals:   04/27/22 1109  BP: 130/70  Pulse: 91  Temp: 97.8 F (36.6 C)  SpO2: 99%    BP Readings from Last 3 Encounters:  04/27/22 130/70  02/27/22 121/75  02/19/22 108/79   Wt Readings from Last 3 Encounters:  04/27/22 236 lb (107 kg)  02/27/22 200 lb (90.7 kg)  02/19/22 199 lb 15.3 oz (90.7 kg)  Per CMA patient was not weighed today and the 236 lbs was from May.   Physical Exam Constitutional:      General: He is not in acute distress.    Appearance: He is not diaphoretic.  Cardiovascular:     Rate and Rhythm: Normal rate and regular rhythm.     Heart sounds: Normal heart sounds.  Pulmonary:     Effort: Pulmonary effort is normal.     Breath sounds: Normal breath sounds.  Musculoskeletal:     Right lower leg: No edema.  Skin:    General: Skin is warm and dry.  Neurological:     Mental Status: He is alert.      Assessment/Plan: Please see individual problem list.  Problem List Items  Addressed This Visit     CAD (coronary artery disease) (Chronic)    Patient has occasional chest pain that seems to be cardiac in nature given that it responds to nitroglycerin.  He has an appointment with cardiology in 1 month and he will keep this.  He will continue atorvastatin 80 mg daily, Plavix 75 mg daily, Zetia 10 mg daily, Imdur 30 mg daily, Lasix 20 mg daily, Entresto 1 tablet twice daily, spironolactone 25 mg daily, and carvedilol  25 mg twice daily.  Discussed as needed use of nitroglycerin.  Discussed if he had persistent chest pain or had to take nitroglycerin 3 times he would need to call EMS for evaluation.      Relevant Medications   amLODipine (NORVASC) 5 MG tablet   Type 2 diabetes mellitus with hyperlipidemia (HCC) (Chronic)    Check A1c and BMP.  Continue Trulicity 4.5 mg weekly, metformin 500 mg daily, and Jardiance 25 mg daily.  He will also continue sliding scale insulin.      Relevant Medications   amLODipine (NORVASC) 5 MG tablet   Continuous Blood Gluc Receiver (FREESTYLE LIBRE 2 READER) DEVI   Continuous Blood Gluc Sensor (FREESTYLE LIBRE 2 SENSOR) MISC   Other Relevant Orders   HgB A1c   Basic Metabolic Panel (BMET)   Chronic diastolic CHF (congestive heart failure) (HCC)    Patient will follow-up with cardiology as planned.      Relevant Medications   amLODipine (NORVASC) 5 MG tablet   Other Relevant Orders   B Nat Peptide   COPD with chronic bronchitis (HCC)    He will restart his Advair.  Discussed continued as needed use of albuterol.  Shortness of breath seems to be COPD related given that it responds to his albuterol.      Essential hypertension - Primary   Relevant Medications   amLODipine (NORVASC) 5 MG tablet   Hx of BKA, left (HCC)    Will refer back for reevaluation of prosthesis fit.      Other Visit Diagnoses     Need for immunization against influenza       Relevant Orders   Flu Vaccine QUAD High Dose(Fluad) (Completed)       Return in about 3 months (around 07/27/2022).  Message sent to practice administrator/RN to determine how to refer to [facility] and get incontinence supplies.   Marikay Alar, MD [facility] Primary Care [facility]

## 2022-04-27 NOTE — Patient Instructions (Addendum)
Nice to see you. We will contact you with your labs. Somebody should work on getting your incontinence supplies. If you do not hear from the prosthesis clinic please let us know.

## 2022-04-27 NOTE — Assessment & Plan Note (Signed)
Check A1c and BMP.  Continue Trulicity 4.5 mg weekly, metformin 500 mg daily, and Jardiance 25 mg daily.  He will also continue sliding scale insulin.

## 2022-04-27 NOTE — Assessment & Plan Note (Signed)
Will refer back for reevaluation of prosthesis fit.

## 2022-04-27 NOTE — Assessment & Plan Note (Signed)
Patient will follow-up with cardiology as planned.

## 2022-04-27 NOTE — Assessment & Plan Note (Signed)
He will restart his Advair.  Discussed continued as needed use of albuterol.  Shortness of breath seems to be COPD related given that it responds to his albuterol.

## 2022-04-27 NOTE — Assessment & Plan Note (Addendum)
Patient has occasional chest pain that seems to be cardiac in nature given that it responds to nitroglycerin.  He has an appointment with cardiology in 1 month and he will keep this.  He will continue atorvastatin 80 mg daily, Plavix 75 mg daily, Zetia 10 mg daily, Imdur 30 mg daily, Lasix 20 mg daily, Entresto 1 tablet twice daily, spironolactone 25 mg daily, and carvedilol 25 mg twice daily.  Discussed as needed use of nitroglycerin.  Discussed if he had persistent chest pain or had to take nitroglycerin 3 times he would need to call EMS for evaluation.

## 2022-04-30 DIAGNOSIS — Z87891 Personal history of nicotine dependence: Secondary | ICD-10-CM | POA: Diagnosis not present

## 2022-04-30 DIAGNOSIS — Z7902 Long term (current) use of antithrombotics/antiplatelets: Secondary | ICD-10-CM | POA: Diagnosis not present

## 2022-04-30 DIAGNOSIS — Z9181 History of falling: Secondary | ICD-10-CM | POA: Diagnosis not present

## 2022-04-30 DIAGNOSIS — J432 Centrilobular emphysema: Secondary | ICD-10-CM | POA: Diagnosis not present

## 2022-04-30 DIAGNOSIS — Z8673 Personal history of transient ischemic attack (TIA), and cerebral infarction without residual deficits: Secondary | ICD-10-CM | POA: Diagnosis not present

## 2022-04-30 DIAGNOSIS — Z7951 Long term (current) use of inhaled steroids: Secondary | ICD-10-CM | POA: Diagnosis not present

## 2022-04-30 DIAGNOSIS — Z89512 Acquired absence of left leg below knee: Secondary | ICD-10-CM | POA: Diagnosis not present

## 2022-04-30 DIAGNOSIS — Z7982 Long term (current) use of aspirin: Secondary | ICD-10-CM | POA: Diagnosis not present

## 2022-04-30 DIAGNOSIS — Z794 Long term (current) use of insulin: Secondary | ICD-10-CM | POA: Diagnosis not present

## 2022-04-30 DIAGNOSIS — I10 Essential (primary) hypertension: Secondary | ICD-10-CM | POA: Diagnosis not present

## 2022-04-30 DIAGNOSIS — Z7984 Long term (current) use of oral hypoglycemic drugs: Secondary | ICD-10-CM | POA: Diagnosis not present

## 2022-04-30 DIAGNOSIS — E1165 Type 2 diabetes mellitus with hyperglycemia: Secondary | ICD-10-CM | POA: Diagnosis not present

## 2022-04-30 LAB — HEMOGLOBIN A1C: Hgb A1c MFr Bld: 7.4 % — ABNORMAL HIGH (ref 4.6–6.5)

## 2022-04-30 MED ORDER — PROCARE ADULT BRIEFS X-LARGE MISC: 1.0000 | 3 refills | Status: DC | PRN

## 2022-04-30 NOTE — Addendum Note (Signed)
Addended by: Kerin Salen R on: 04/30/2022 10:00 AM   Modules accepted: Orders

## 2022-04-30 NOTE — Addendum Note (Signed)
Addended by: Nanci Pina on: 04/30/2022 09:24 AM   Modules accepted: Orders

## 2022-05-01 NOTE — Progress Notes (Signed)
Done see prior message on patient.  Minta Fair,cma

## 2022-05-01 NOTE — Progress Notes (Signed)
I called the patient and spoke with him and his son, the son stated that the patient found the name of the company for his sock insert inside his prosthetic and they called and scheduled a appointment to be fitted,  I also informed them that I faxed the information to [facility] and confirmation was given.  Aneudy Champlain,cma

## 2022-05-03 DIAGNOSIS — Z7984 Long term (current) use of oral hypoglycemic drugs: Secondary | ICD-10-CM | POA: Diagnosis not present

## 2022-05-03 DIAGNOSIS — Z9181 History of falling: Secondary | ICD-10-CM | POA: Diagnosis not present

## 2022-05-03 DIAGNOSIS — Z8673 Personal history of transient ischemic attack (TIA), and cerebral infarction without residual deficits: Secondary | ICD-10-CM | POA: Diagnosis not present

## 2022-05-03 DIAGNOSIS — Z794 Long term (current) use of insulin: Secondary | ICD-10-CM | POA: Diagnosis not present

## 2022-05-03 DIAGNOSIS — E1165 Type 2 diabetes mellitus with hyperglycemia: Secondary | ICD-10-CM | POA: Diagnosis not present

## 2022-05-03 DIAGNOSIS — Z7902 Long term (current) use of antithrombotics/antiplatelets: Secondary | ICD-10-CM | POA: Diagnosis not present

## 2022-05-03 DIAGNOSIS — I10 Essential (primary) hypertension: Secondary | ICD-10-CM | POA: Diagnosis not present

## 2022-05-03 DIAGNOSIS — Z87891 Personal history of nicotine dependence: Secondary | ICD-10-CM | POA: Diagnosis not present

## 2022-05-03 DIAGNOSIS — J432 Centrilobular emphysema: Secondary | ICD-10-CM | POA: Diagnosis not present

## 2022-05-03 DIAGNOSIS — Z7951 Long term (current) use of inhaled steroids: Secondary | ICD-10-CM | POA: Diagnosis not present

## 2022-05-03 DIAGNOSIS — Z7982 Long term (current) use of aspirin: Secondary | ICD-10-CM | POA: Diagnosis not present

## 2022-05-03 DIAGNOSIS — Z89512 Acquired absence of left leg below knee: Secondary | ICD-10-CM | POA: Diagnosis not present

## 2022-05-05 ENCOUNTER — Other Ambulatory Visit: Payer: Self-pay | Admitting: Family

## 2022-05-07 DIAGNOSIS — Z89512 Acquired absence of left leg below knee: Secondary | ICD-10-CM | POA: Diagnosis not present

## 2022-05-07 DIAGNOSIS — Z7982 Long term (current) use of aspirin: Secondary | ICD-10-CM | POA: Diagnosis not present

## 2022-05-07 DIAGNOSIS — Z7951 Long term (current) use of inhaled steroids: Secondary | ICD-10-CM | POA: Diagnosis not present

## 2022-05-07 DIAGNOSIS — I10 Essential (primary) hypertension: Secondary | ICD-10-CM | POA: Diagnosis not present

## 2022-05-07 DIAGNOSIS — J432 Centrilobular emphysema: Secondary | ICD-10-CM | POA: Diagnosis not present

## 2022-05-07 DIAGNOSIS — E1165 Type 2 diabetes mellitus with hyperglycemia: Secondary | ICD-10-CM | POA: Diagnosis not present

## 2022-05-07 DIAGNOSIS — Z794 Long term (current) use of insulin: Secondary | ICD-10-CM | POA: Diagnosis not present

## 2022-05-07 DIAGNOSIS — Z9181 History of falling: Secondary | ICD-10-CM | POA: Diagnosis not present

## 2022-05-07 DIAGNOSIS — Z7984 Long term (current) use of oral hypoglycemic drugs: Secondary | ICD-10-CM | POA: Diagnosis not present

## 2022-05-07 DIAGNOSIS — Z7902 Long term (current) use of antithrombotics/antiplatelets: Secondary | ICD-10-CM | POA: Diagnosis not present

## 2022-05-07 DIAGNOSIS — Z8673 Personal history of transient ischemic attack (TIA), and cerebral infarction without residual deficits: Secondary | ICD-10-CM | POA: Diagnosis not present

## 2022-05-07 DIAGNOSIS — Z87891 Personal history of nicotine dependence: Secondary | ICD-10-CM | POA: Diagnosis not present

## 2022-05-08 ENCOUNTER — Telehealth: Payer: Self-pay | Admitting: Family Medicine

## 2022-05-08 DIAGNOSIS — E1165 Type 2 diabetes mellitus with hyperglycemia: Secondary | ICD-10-CM | POA: Diagnosis not present

## 2022-05-08 DIAGNOSIS — Z7984 Long term (current) use of oral hypoglycemic drugs: Secondary | ICD-10-CM | POA: Diagnosis not present

## 2022-05-08 DIAGNOSIS — Z89512 Acquired absence of left leg below knee: Secondary | ICD-10-CM | POA: Diagnosis not present

## 2022-05-08 DIAGNOSIS — Z7951 Long term (current) use of inhaled steroids: Secondary | ICD-10-CM | POA: Diagnosis not present

## 2022-05-08 DIAGNOSIS — Z7902 Long term (current) use of antithrombotics/antiplatelets: Secondary | ICD-10-CM | POA: Diagnosis not present

## 2022-05-08 DIAGNOSIS — J432 Centrilobular emphysema: Secondary | ICD-10-CM | POA: Diagnosis not present

## 2022-05-08 DIAGNOSIS — Z794 Long term (current) use of insulin: Secondary | ICD-10-CM | POA: Diagnosis not present

## 2022-05-08 DIAGNOSIS — Z9181 History of falling: Secondary | ICD-10-CM | POA: Diagnosis not present

## 2022-05-08 DIAGNOSIS — Z87891 Personal history of nicotine dependence: Secondary | ICD-10-CM | POA: Diagnosis not present

## 2022-05-08 DIAGNOSIS — Z7982 Long term (current) use of aspirin: Secondary | ICD-10-CM | POA: Diagnosis not present

## 2022-05-08 DIAGNOSIS — I10 Essential (primary) hypertension: Secondary | ICD-10-CM | POA: Diagnosis not present

## 2022-05-08 DIAGNOSIS — Z8673 Personal history of transient ischemic attack (TIA), and cerebral infarction without residual deficits: Secondary | ICD-10-CM | POA: Diagnosis not present

## 2022-05-08 NOTE — Telephone Encounter (Signed)
Lena from [employer] called stating pt had a fall today and has a new wound to the abdomen and posterior neck. Alden Benjamin would like to be called also regarding the liberty form

## 2022-05-09 NOTE — Telephone Encounter (Signed)
LVM for [employer] [facility] to call back and give their fax number to fax over an order.  Zhana Jeangilles,cma

## 2022-05-10 NOTE — Telephone Encounter (Signed)
I called the patients son and the patient is scheduled for Friday for his wound from a fall.  Rodrick Payson,cma

## 2022-05-11 ENCOUNTER — Ambulatory Visit (INDEPENDENT_AMBULATORY_CARE_PROVIDER_SITE_OTHER): Payer: Medicare Other | Admitting: Family Medicine

## 2022-05-11 ENCOUNTER — Encounter: Payer: Self-pay | Admitting: Family Medicine

## 2022-05-11 DIAGNOSIS — E785 Hyperlipidemia, unspecified: Secondary | ICD-10-CM

## 2022-05-11 DIAGNOSIS — W19XXXA Unspecified fall, initial encounter: Secondary | ICD-10-CM | POA: Diagnosis not present

## 2022-05-11 DIAGNOSIS — E1169 Type 2 diabetes mellitus with other specified complication: Secondary | ICD-10-CM | POA: Diagnosis not present

## 2022-05-11 DIAGNOSIS — B372 Candidiasis of skin and nail: Secondary | ICD-10-CM | POA: Diagnosis not present

## 2022-05-11 MED ORDER — TIRZEPATIDE 5 MG/0.5ML ~~LOC~~ SOAJ: 5.0000 mg | SUBCUTANEOUS | 3 refills | Status: DC

## 2022-05-11 MED ORDER — NYSTATIN 100000 UNIT/GM EX OINT: 1.0000 | TOPICAL_OINTMENT | Freq: Two times a day (BID) | CUTANEOUS | 0 refills | Status: DC

## 2022-05-11 MED ORDER — TIRZEPATIDE 2.5 MG/0.5ML ~~LOC~~ SOAJ: 2.5000 mg | SUBCUTANEOUS | 0 refills | Status: DC

## 2022-05-11 NOTE — Telephone Encounter (Signed)
Plan to see today as scheduled.

## 2022-05-11 NOTE — Assessment & Plan Note (Signed)
Patient had a fall out of bed.  It sounds as though the mattress slid on him.  Discussed trying to make it so the mattress will slide.  He already has railings in place on his bed.  He had no injury from this per his report.

## 2022-05-11 NOTE — Progress Notes (Signed)
Tommi Rumps, MD Phone: [phone]  Danny Estimable Dusseau Sr. is a 71 y.o. male who presents today for follow-up.  Fall: Patient had a fall recently when he was trying to get out of bed in the middle the night.  He notes the mattress slid on him and he slid down to the floor.  He notes no injury.  He notes no head injury.  No loss of consciousness.  Rash: Patient has a rash along his beltline underneath his abdominal fat.  He notes there is some erythema and itching in this area.  Diabetes: We previously discussed adding another medication to his regimen.  He is unable to tolerate metformin.  It looks like somebody mentioned Januvia to him though that is not an option given that he is on Trulicity.  Social History   Tobacco Use  Smoking Status Former   Packs/day: 3.00   Years: 0.00   Total pack years: 0.00   Types: Cigarettes   Quit date: 09/07/2009   Years since quitting: 12.6  Smokeless Tobacco Never  Tobacco Comments   quit june 2011    Current Outpatient Medications on File Prior to Visit  Medication Sig Dispense Refill   acetaminophen (TYLENOL) 325 MG tablet Take 2 tablets (650 mg total) by mouth every 4 (four) hours as needed for mild pain (or temp > 37.5 C (99.5 F)).     albuterol (PROVENTIL) (2.5 MG/3ML) 0.083% nebulizer solution Take 3 mLs (2.5 mg total) by nebulization every 6 (six) hours as needed for wheezing or shortness of breath. 150 mL 1   Albuterol Sulfate (PROAIR RESPICLICK) 301 (90 Base) MCG/ACT AEPB Inhale 1 puff into the lungs every 6 (six) hours as needed. 1 each 12   amLODipine (NORVASC) 5 MG tablet Take 1 tablet (5 mg total) by mouth daily. 30 tablet 0   aspirin EC 81 MG tablet Take 1 tablet (81 mg total) by mouth daily. 150 tablet 2   atorvastatin (LIPITOR) 80 MG tablet Take 1 tablet (80 mg total) by mouth daily. 90 tablet 3   butalbital-acetaminophen-caffeine (FIORICET) 50-325-40 MG tablet Take 1 tablet by mouth every 8 (eight) hours as needed for headache.  14 tablet 0   carvedilol (COREG) 25 MG tablet TAKE 1 TABLET BY MOUTH TWICE  DAILY WITH MEALS 60 tablet 11   clopidogrel (PLAVIX) 75 MG tablet Take 1 tablet (75 mg total) by mouth daily. 30 tablet 11   Continuous Blood Gluc Receiver (FREESTYLE LIBRE 2 READER) DEVI 1 Device by Does not apply route 4 (four) times daily. 1 each 0   Continuous Blood Gluc Sensor (FREESTYLE LIBRE 2 SENSOR) MISC USE 1 SENSOR EVERY 14 DAYS 6 each 0   empagliflozin (JARDIANCE) 25 MG TABS tablet Take 1 tablet (25 mg total) by mouth daily. 90 tablet 3   escitalopram (LEXAPRO) 10 MG tablet Take 1 tablet (10 mg total) by mouth daily. 90 tablet 1   fluticasone-salmeterol (ADVAIR) 250-50 MCG/ACT AEPB Inhale 1 puff into the lungs 2 (two) times daily. 3 each 1   furosemide (LASIX) 20 MG tablet TAKE 1 TABLET (20MG ) BY MOUTH ONCE EVERY OTHER DAY. 45 tablet 3   gabapentin (NEURONTIN) 300 MG capsule Take 1 capsule (300 mg total) by mouth at bedtime. 30 capsule 3   HYDROcodone-acetaminophen (NORCO/VICODIN) 5-325 MG tablet Take 1 tablet by mouth every 4 (four) hours as needed for moderate pain. 20 tablet 0   Incontinence Supply Disposable (PROCARE ADULT BRIEFS X-LARGE) MISC 1 Application by Does not apply route  as needed (For incontinence). 50 each 3   insulin aspart (NOVOLOG) 100 UNIT/ML injection Inject 0-15 Units into the skin 4 (four) times daily -  before meals and at bedtime. CBG < 70: Implement Hypoglycemia Standing Orders and refer to Hypoglycemia Standing Orders sidebar report CBG 70 - 120: 0 units CBG 121 - 150: 2 units CBG 151 - 200: 3 units CBG 201 - 250: 5 units CBG 251 - 300: 8 units CBG 301 - 350: 11 units CBG 351 - 400: 15 units CBG > 400: call MD 10 mL 11   isosorbide mononitrate (IMDUR) 30 MG 24 hr tablet TAKE 1 TABLET BY MOUTH EVERY DAY 90 tablet 3   nitroGLYCERIN (NITROSTAT) 0.4 MG SL tablet Place 0.4 mg under the tongue every 5 (five) minutes as needed for chest pain.     ondansetron (ZOFRAN) 4 MG tablet Take 4 mg by  mouth every 6 (six) hours as needed.     ondansetron (ZOFRAN-ODT) 4 MG disintegrating tablet Allow 1-2 tablets to dissolve in your mouth every 8 hours as needed for nausea/vomiting 30 tablet 0   sacubitril-valsartan (ENTRESTO) 49-51 MG TAKE 1 TABLET BY MOUTH 2 (TWO) TIMES DAILY. STOP LISINOPRIL AND POTASSIUM 180 tablet 1   spironolactone (ALDACTONE) 25 MG tablet Take 1 tablet (25 mg total) by mouth daily. 90 tablet 1   tamsulosin (FLOMAX) 0.4 MG CAPS capsule Take 1 capsule (0.4 mg total) by mouth daily. 90 capsule 1   No current facility-administered medications on file prior to visit.     ROS see history of present illness  Objective  Physical Exam Vitals:   05/11/22 1338  BP: (!) 140/70  Pulse: 100  Temp: 98 F (36.7 C)  SpO2: 96%    BP Readings from Last 3 Encounters:  05/11/22 (!) 140/70  04/27/22 130/70  02/27/22 121/75   Wt Readings from Last 3 Encounters:  04/27/22 236 lb (107 kg)  02/27/22 200 lb (90.7 kg)  02/19/22 199 lb 15.3 oz (90.7 kg)    Physical Exam Abdominal:        Assessment/Plan: Please see individual problem list.  Problem List Items Addressed This Visit     Type 2 diabetes mellitus with hyperlipidemia (HCC) (Chronic)    I am going to change his Trulicity over to [facility].  We will start at 2.5 mg and then increase to 5 mg after 4 weeks.  Discussed if he has nausea or abdominal pain with the [facility] he needs to let us know immediately.  He will continue sliding scale NovoLog and Jardiance 25 mg daily.      Relevant Medications   tirzepatide (MOUNJARO) 2.5 MG/0.5ML Pen   tirzepatide (MOUNJARO) 5 MG/0.5ML Pen   Candidal intertrigo    Rash is likely candidal intertrigo.  We will trial topical nystatin cream.  If not improving he will let us know.      Relevant Medications   nystatin ointment (MYCOSTATIN)   Fall    Patient had a fall out of bed.  It sounds as though the mattress slid on him.  Discussed trying to make it so the mattress  will slide.  He already has railings in place on his bed.  He had no injury from this per his report.       Return for As scheduled.   Marikay Alar, MD [facility] Primary Care [facility]

## 2022-05-11 NOTE — Patient Instructions (Addendum)
Nice to see you. Please use the topical nystatin on the rash around the belt line.  If this is not helping in the next week please let me know. We will get a switch you over to [facility].  This will take the place of your Trulicity.  If the [facility] is affordable please start this when you would typically take your next dose of Trulicity.  Once you start on the [facility] you will no longer take the Trulicity. You will start on Mounjaro 2.5 mg once weekly.  You will do this for 4 weeks and then after that you will increase to Mounjaro 5 mg once weekly.

## 2022-05-11 NOTE — Assessment & Plan Note (Signed)
Rash is likely candidal intertrigo.  We will trial topical nystatin cream.  If not improving he will let us know.

## 2022-05-11 NOTE — Assessment & Plan Note (Addendum)
I am going to change his Trulicity over to [facility].  We will start at 2.5 mg and then increase to 5 mg after 4 weeks.  Discussed if he has nausea or abdominal pain with the [facility] he needs to let us know immediately.  He will continue sliding scale NovoLog and Jardiance 25 mg daily.

## 2022-05-12 DIAGNOSIS — I10 Essential (primary) hypertension: Secondary | ICD-10-CM | POA: Diagnosis not present

## 2022-05-12 DIAGNOSIS — Z9181 History of falling: Secondary | ICD-10-CM | POA: Diagnosis not present

## 2022-05-12 DIAGNOSIS — S31109D Unspecified open wound of abdominal wall, unspecified quadrant without penetration into peritoneal cavity, subsequent encounter: Secondary | ICD-10-CM | POA: Diagnosis not present

## 2022-05-12 DIAGNOSIS — Z8673 Personal history of transient ischemic attack (TIA), and cerebral infarction without residual deficits: Secondary | ICD-10-CM | POA: Diagnosis not present

## 2022-05-12 DIAGNOSIS — Z7982 Long term (current) use of aspirin: Secondary | ICD-10-CM | POA: Diagnosis not present

## 2022-05-12 DIAGNOSIS — Z87891 Personal history of nicotine dependence: Secondary | ICD-10-CM | POA: Diagnosis not present

## 2022-05-12 DIAGNOSIS — Z89512 Acquired absence of left leg below knee: Secondary | ICD-10-CM | POA: Diagnosis not present

## 2022-05-12 DIAGNOSIS — J432 Centrilobular emphysema: Secondary | ICD-10-CM | POA: Diagnosis not present

## 2022-05-12 DIAGNOSIS — Z7902 Long term (current) use of antithrombotics/antiplatelets: Secondary | ICD-10-CM | POA: Diagnosis not present

## 2022-05-12 DIAGNOSIS — E1165 Type 2 diabetes mellitus with hyperglycemia: Secondary | ICD-10-CM | POA: Diagnosis not present

## 2022-05-14 ENCOUNTER — Telehealth: Payer: Self-pay

## 2022-05-14 DIAGNOSIS — Z7982 Long term (current) use of aspirin: Secondary | ICD-10-CM | POA: Diagnosis not present

## 2022-05-14 DIAGNOSIS — Z89512 Acquired absence of left leg below knee: Secondary | ICD-10-CM | POA: Diagnosis not present

## 2022-05-14 DIAGNOSIS — Z8673 Personal history of transient ischemic attack (TIA), and cerebral infarction without residual deficits: Secondary | ICD-10-CM | POA: Diagnosis not present

## 2022-05-14 DIAGNOSIS — Z87891 Personal history of nicotine dependence: Secondary | ICD-10-CM | POA: Diagnosis not present

## 2022-05-14 DIAGNOSIS — J432 Centrilobular emphysema: Secondary | ICD-10-CM | POA: Diagnosis not present

## 2022-05-14 DIAGNOSIS — Z9181 History of falling: Secondary | ICD-10-CM | POA: Diagnosis not present

## 2022-05-14 DIAGNOSIS — S31109D Unspecified open wound of abdominal wall, unspecified quadrant without penetration into peritoneal cavity, subsequent encounter: Secondary | ICD-10-CM | POA: Diagnosis not present

## 2022-05-14 DIAGNOSIS — Z7902 Long term (current) use of antithrombotics/antiplatelets: Secondary | ICD-10-CM | POA: Diagnosis not present

## 2022-05-14 DIAGNOSIS — E1165 Type 2 diabetes mellitus with hyperglycemia: Secondary | ICD-10-CM | POA: Diagnosis not present

## 2022-05-14 DIAGNOSIS — I10 Essential (primary) hypertension: Secondary | ICD-10-CM | POA: Diagnosis not present

## 2022-05-14 NOTE — Progress Notes (Deleted)
[facility] [address] [place]. [place] [address] [phone]       HOSPITAL FOLLOW UP NOTE  [name] Date of Birth:  1950-08-20 Medical Record Number:  [id number]   Reason for Referral:  hospital stroke follow up    SUBJECTIVE:   CHIEF COMPLAINT:  No chief complaint on file.   HPI:   Danny TAIT Sr. is a 71 y.o. who  has a past medical history of CAD (coronary artery disease), Chronic combined systolic and diastolic CHF (congestive heart failure) ([place]), CKD (chronic kidney disease), stage III ([place]), COPD (chronic obstructive pulmonary disease) ([place]), DM2 (diabetes mellitus, type 2) ([place]), Erectile dysfunction, HLD (hyperlipidemia), HTN (hypertension), and Ischemic cardiomyopathy.  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 Patient presented on 09/30/2021 with altered consciousness. He lives with his son and last seen normal 2-3 days prior. Son had been admitted to hospital for intracranial hemorrhage around that time. Danny [name] was found by another family member. He was on the floor and more confused than normal. Family transported him to [facility] where CT showed [place] with intraventricular extension. He was transferred to [facility]. Plavix and asa held. Repeat CT stable. MRI showed absent flow rate vertebral and proximal basilar artery with distal reconstitution, punctate hemorrhage, unchanged intracranial hemorrhage. CTA of the head and neck, occluded left before, chronically stenosis right vertebral artery and severe stenosis at B1 and B2. Hospitalization was fairly uneventful. He did develop some headaches easily managed with Fioricet/tramadol PRN. He was discharged to SNF. Personally reviewed hospitalization pertinent progress notes, lab work and imaging.  Evaluated by Dr Erlinda Hong.   Since, he reports doing well. He presents, alone, and history limited. He is A&Ox4 but caution used with HPI due to history of dementia. He denies residual weakness. He is s/p  left BKA in 11/2020 and recently received prosthetic leg. 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 He is able to walk short distance with PT assistance. He uses wheelchair. BP is a little low, today. He reports being asymptomatic. Unclear what readings are at SNF.   He quit smoking 12 years ago. He denies drug or alcohol use.   UPDATE 05/16/2022 ALL: Mr Danny [name] returns for follow up for CVA. He was seen 11/14/2021 for hospital follow up following nontraumatic ICH. MRI 12/2021 showed stable hemorrhage with no new bleeding. Plavix resumed. He is s/p right peroneal, SFA and popliteal angioplasty with thrombectomy and Viabahn stent placement of right popliteal. He was last seen 01/2022 and reported claudication symptoms were improved.   He has had multiple falls. Fortunately, no injuries. He is followed closely by PCP. Recently started on Moujaro. A1C 7.4 04/2022.   PERTINENT IMAGING/LABS  Code Stroke Head CT- 12 mm focus of acute hemorrhage within the periventricular white matter adjacent to the body of the right lateral ventricle. Intraventricular hemorrhage appears to reflect extension of this hematoma. There is resulting mild communicating hydrocephalus. Repeat head CT- Unchanged parenchymal and intraventricular hemorrhage. Lateral ventriculomegaly is mildly improved. CTA head and neck occluded left V4 segment, likely chronic and stenosis of right vertebral artery with severe stenosis at V1 and V2 segments. MRI  absent flow void in right vertebral and proximal basilar artery with distal reconstitution, fetal PCAs, punctate acute infarct in right frontal cortex, unchanged ICH 2D Echo no atrial level shunt, EF not reported   A1C Lab Results  Component Value Date   HGBA1C 7.4 (H) 04/27/2022    Lipid Panel     Component  Value Date/Time   CHOL 112 10/03/2021 1018   TRIG 147 10/03/2021 1018   HDL 44 10/03/2021 1018   CHOLHDL 2.5 10/03/2021 1018   VLDL 29 10/03/2021 1018   LDLCALC 39 10/03/2021  1018   LDLDIRECT 64.0 03/20/2018 0950      ROS:   14 system review of systems performed and negative with exception of those listed in HPI  PMH:  Past Medical History:  Diagnosis Date   CAD (coronary artery disease)    a. 01/2010 PCI of LAD; b. 09/2014 PCI/DES of mLAD due to ISR. D1 80, D2 80(jailed), LCX 65m; c. 07/2016 NSTEMI/Cath: LM nl, LAD 32m ISR, 40d, D1 90ost, 80p, D2 90ost, RI min irregs, LCX min irregs, OM1 90 small, OM2/3 min irregs, RCA min irregs, RPLB1 90, EF 25-35%.   Chronic combined systolic and diastolic CHF (congestive heart failure) ([place])    a. 07/2016 Echo: EF 30%, severe septal/anterior HK, Gr1 DD.   CKD (chronic kidney disease), stage III (HCC)    COPD (chronic obstructive pulmonary disease) (HCC)    DM2 (diabetes mellitus, type 2) (HCC)    Erectile dysfunction    HLD (hyperlipidemia)    HTN (hypertension)    Ischemic cardiomyopathy    a. 07/2016 Echo: EF 30% w/ sev septal/ant HK. Gr1 DD.    PSH:  Past Surgical History:  Procedure Laterality Date   AMPUTATION Left 11/10/2020   Procedure: AMPUTATION BELOW KNEE;  Surgeon: [name], MD;  Location: [facility] ORS;  Service: General;  Laterality: Left;   BELOW KNEE LEG AMPUTATION     CAD: stent to the LAD     CARDIAC CATHETERIZATION  10/01/2014   CARDIAC CATHETERIZATION N/A 07/23/2016   Procedure: Left Heart Cath and Coronary Angiography;  Surgeon: [name], MD;  Location: [place] [facility];  Service: Cardiovascular;  Laterality: N/A;   CORONARY ANGIOPLASTY WITH STENT PLACEMENT  10/01/2014   LOWER EXTREMITY ANGIOGRAPHY Left 10/31/2020   Procedure: LOWER EXTREMITY ANGIOGRAPHY;  Surgeon: [name], MD;  Location: [place] [facility];  Service: Cardiovascular;  Laterality: Left;   LOWER EXTREMITY ANGIOGRAPHY Right 11/20/2021   Procedure: Lower Extremity Angiography;  Surgeon: [name], MD;  Location: [place] [facility];  Service: Cardiovascular;  Laterality: Right;    Social History:  Social  History   Socioeconomic History   Marital status: Single    Spouse name: Not on file   Number of children: Not on file   Years of education: Not on file   Highest education level: Not on file  Occupational History   Not on file  Tobacco Use   Smoking status: Former    Packs/day: 3.00    Years: 0.00    Total pack years: 0.00    Types: Cigarettes    Quit date: 09/07/2009    Years since quitting: 12.6   Smokeless tobacco: Never   Tobacco comments:    quit june 2011  Vaping Use   Vaping Use: Never used  Substance and Sexual Activity   Alcohol use: Not Currently    Alcohol/week: 7.0 standard drinks of alcohol    Types: 6 Cans of beer, 1 Standard drinks or equivalent per week    Comment: every other weekend   Drug use: Never   Sexual activity: Not on file  Other Topics Concern   Not on file  Social History Narrative   Not on file   Social Determinants of Health   Financial Resource Strain: Low Risk  (  12/12/2021)   Overall Financial Resource Strain (CARDIA)    Difficulty of Paying Living Expenses: Not hard at all  Food Insecurity: No Food Insecurity (12/12/2021)   Hunger Vital Sign    Worried About Running Out of Food in the Last Year: Never true    Ran Out of Food in the Last Year: Never true  Transportation Needs: No Transportation Needs (12/12/2021)   PRAPARE - Hydrologist (Medical): No    Lack of Transportation (Non-Medical): No  Physical Activity: Unknown (12/12/2021)   Exercise Vital Sign    Days of Exercise per Week: 0 days    Minutes of Exercise per Session: Not on file  Stress: No Stress Concern Present (12/12/2021)   [place]    Feeling of Stress : Only a little  Social Connections: Unknown (12/12/2021)   Social Connection and Isolation Panel [NHANES]    Frequency of Communication with Friends and Family: Not on file    Frequency of Social Gatherings with Friends and Family:  More than three times a week    Attends Religious Services: Not on file    Active Member of Clubs or Organizations: Not on file    Attends Archivist Meetings: Not on file    Marital Status: Not on file  Intimate Partner Violence: Not At Risk (12/12/2021)   Humiliation, Afraid, Rape, and Kick questionnaire    Fear of Current or Ex-Partner: No    Emotionally Abused: No    Physically Abused: No    Sexually Abused: No    Family History:  Family History  Problem Relation Age of Onset   Heart attack Father        complications   Cancer Brother     Medications:   Current Outpatient Medications on File Prior to Visit  Medication Sig Dispense Refill   acetaminophen (TYLENOL) 325 MG tablet Take 2 tablets (650 mg total) by mouth every 4 (four) hours as needed for mild pain (or temp > 37.5 C (99.5 F)).     albuterol (PROVENTIL) (2.5 MG/3ML) 0.083% nebulizer solution Take 3 mLs (2.5 mg total) by nebulization every 6 (six) hours as needed for wheezing or shortness of breath. 150 mL 1   Albuterol Sulfate (PROAIR RESPICLICK) 123XX123 (90 Base) MCG/ACT AEPB Inhale 1 puff into the lungs every 6 (six) hours as needed. 1 each 12   amLODipine (NORVASC) 5 MG tablet Take 1 tablet (5 mg total) by mouth daily. 30 tablet 0   aspirin EC 81 MG tablet Take 1 tablet (81 mg total) by mouth daily. 150 tablet 2   atorvastatin (LIPITOR) 80 MG tablet Take 1 tablet (80 mg total) by mouth daily. 90 tablet 3   butalbital-acetaminophen-caffeine (FIORICET) 50-325-40 MG tablet Take 1 tablet by mouth every 8 (eight) hours as needed for headache. 14 tablet 0   carvedilol (COREG) 25 MG tablet TAKE 1 TABLET BY MOUTH TWICE  DAILY WITH MEALS 60 tablet 11   clopidogrel (PLAVIX) 75 MG tablet Take 1 tablet (75 mg total) by mouth daily. 30 tablet 11   Continuous Blood Gluc Receiver (FREESTYLE LIBRE 2 READER) DEVI 1 Device by Does not apply route 4 (four) times daily. 1 each 0   Continuous Blood Gluc Sensor (FREESTYLE LIBRE 2  SENSOR) MISC USE 1 SENSOR EVERY 14 DAYS 6 each 0   empagliflozin (JARDIANCE) 25 MG TABS tablet Take 1 tablet (25 mg total) by mouth daily. 90 tablet  3   escitalopram (LEXAPRO) 10 MG tablet Take 1 tablet (10 mg total) by mouth daily. 90 tablet 1   fluticasone-salmeterol (ADVAIR) 250-50 MCG/ACT AEPB Inhale 1 puff into the lungs 2 (two) times daily. 3 each 1   furosemide (LASIX) 20 MG tablet TAKE 1 TABLET (20MG ) BY MOUTH ONCE EVERY OTHER DAY. 45 tablet 3   gabapentin (NEURONTIN) 300 MG capsule Take 1 capsule (300 mg total) by mouth at bedtime. 30 capsule 3   HYDROcodone-acetaminophen (NORCO/VICODIN) 5-325 MG tablet Take 1 tablet by mouth every 4 (four) hours as needed for moderate pain. 20 tablet 0   Incontinence Supply Disposable (PROCARE ADULT BRIEFS X-LARGE) MISC 1 Application by Does not apply route as needed (For incontinence). 50 each 3   insulin aspart (NOVOLOG) 100 UNIT/ML injection Inject 0-15 Units into the skin 4 (four) times daily -  before meals and at bedtime. CBG < 70: Implement Hypoglycemia Standing Orders and refer to Hypoglycemia Standing Orders sidebar report CBG 70 - 120: 0 units CBG 121 - 150: 2 units CBG 151 - 200: 3 units CBG 201 - 250: 5 units CBG 251 - 300: 8 units CBG 301 - 350: 11 units CBG 351 - 400: 15 units CBG > 400: call MD 10 mL 11   isosorbide mononitrate (IMDUR) 30 MG 24 hr tablet TAKE 1 TABLET BY MOUTH EVERY DAY 90 tablet 3   nitroGLYCERIN (NITROSTAT) 0.4 MG SL tablet Place 0.4 mg under the tongue every 5 (five) minutes as needed for chest pain.     nystatin ointment (MYCOSTATIN) Apply 1 Application topically 2 (two) times daily. 30 g 0   ondansetron (ZOFRAN) 4 MG tablet Take 4 mg by mouth every 6 (six) hours as needed.     ondansetron (ZOFRAN-ODT) 4 MG disintegrating tablet Allow 1-2 tablets to dissolve in your mouth every 8 hours as needed for nausea/vomiting 30 tablet 0   sacubitril-valsartan (ENTRESTO) 49-51 MG TAKE 1 TABLET BY MOUTH 2 (TWO) TIMES DAILY. STOP  LISINOPRIL AND POTASSIUM 180 tablet 1   spironolactone (ALDACTONE) 25 MG tablet Take 1 tablet (25 mg total) by mouth daily. 90 tablet 1   tamsulosin (FLOMAX) 0.4 MG CAPS capsule Take 1 capsule (0.4 mg total) by mouth daily. 90 capsule 1   tirzepatide (MOUNJARO) 2.5 MG/0.5ML Pen Inject 2.5 mg into the skin once a week for 28 days. 2 mL 0   tirzepatide (MOUNJARO) 5 MG/0.5ML Pen Inject 5 mg into the skin once a week. Start after you finish the 28 day course of the 2.5 mg dose. 6 mL 3   No current facility-administered medications on file prior to visit.    Allergies:   Allergies  Allergen Reactions   Metformin And Related Diarrhea    If he takes it twice a day it causes diarhea, so he reduced to once daily      OBJECTIVE:  Physical Exam  There were no vitals filed for this visit.  There is no height or weight on file to calculate BMI. No results found.     04/27/2022   11:12 AM  Depression screen PHQ 2/9  Decreased Interest 0  Down, Depressed, Hopeless 0  PHQ - 2 Score 0     General: well developed, well nourished, seated, in no evident distress Head: head normocephalic and atraumatic.   Neck: supple with no carotid or supraclavicular bruits Cardiovascular: regular rate and rhythm, no murmurs Musculoskeletal: no deformity Skin:  no rash/petichiae Vascular:  Normal pulses all extremities  Neurologic Exam Mental Status: Awake and fully alert.  Fluent speech and language.  Oriented to place and time. Recent and remote memory intact. Attention span, concentration and fund of knowledge appropriate. Mood and affect appropriate.  Cranial Nerves: Fundoscopic exam reveals sharp disc margins. Pupils equal, briskly reactive to light. Extraocular movements full without nystagmus. Visual fields full to confrontation. Hearing intact. Facial sensation intact. Face, tongue, palate moves normally and symmetrically.  Motor: Normal bulk and tone. Normal strength in all tested extremity  muscles, left BKA.  Sensory.: intact to touch , pinprick , position and vibratory sensation.  Gait and Station: unable to assess, patient alone and no assistive device.  Reflexes: 1+ and symmetric.    NIHSS  1 Modified Rankin  2    ASSESSMENT: Danny [name] Sr. is a 71 y.o. year old male with Most likely hypertensive bleed although possibility of ischemic stroke with hemorrhagic conversion and intraventricular extension remains as a possibility. Vascular risk factors include HLD, HTN, CAD, DM, previous smoker.    PLAN:  ICH vs HT with IVH : Residual deficit: none.  Continue atorvastatin 80mg  daily for secondary stroke prevention. Discussed secondary stroke prevention measures and importance of close PCP follow up for aggressive stroke risk factor management. I have gone over the pathophysiology of stroke, warning signs and symptoms, risk factors and their management in some detail with instructions to go to the closest emergency room for symptoms of concern. We will repeat MRI to assess for continued stability of bleed. May consider resuming asa/Plavix pending results and stable BP.  HTN: BP goal <130/90.  Current BP 91/60 on amlodipine 5mg , carvedilol , isosorbide 30mg  QD, Lasix 20mg  QOD, Entresto 49-51 BID per PCP HLD: LDL goal <70. Recent LDL 39 on atorvastatin 80mg  QD per PCP.  DMII: A1c goal<7.0. Recent A1c 7.4. Continue Novolog Insulin per sliding scale, Trulicity once weekly, metformin 500mg  BID, Jardiance 25mg  QD per PCP/cards. Continue close follow up PCP and cardiology.  Atherosclerosis right lower ext: continue close follow up with vascular surgery. Congestive heart failure: continue close follow up with cardiology.    Follow up in 6 months or call earlier if needed   CC:  [facility] provider: Dr. Leonie Man PCP: [name], MD    I spent 45 minutes of face-to-face and non-face-to-face time with patient.  This included previsit chart review including review of recent  hospitalization, lab review, study review, order entry, electronic health record documentation, patient education regarding recent stroke including etiology, secondary stroke prevention measures and importance of managing stroke risk factors, residual deficits and typical recovery time and answered all other questions to patient satisfaction   Debbora Presto, [facility]  [facility] [address] [place], [place] [address]  Phone [phone] Fax [phone] Note: This document was prepared with digital dictation and possible smart phrase technology. Any transcriptional errors that result from this process are unintentional.

## 2022-05-14 NOTE — Telephone Encounter (Signed)
Noted. If he has any woozy sensation with future dosing of mounjaro they need to let us know.
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
## 2022-05-14 NOTE — Telephone Encounter (Signed)
LVM for stephanie to call back if patient has any woozy feelings int he future.  Callaway Hailes,cma

## 2022-05-15 DIAGNOSIS — Z7902 Long term (current) use of antithrombotics/antiplatelets: Secondary | ICD-10-CM | POA: Diagnosis not present

## 2022-05-15 DIAGNOSIS — I10 Essential (primary) hypertension: Secondary | ICD-10-CM | POA: Diagnosis not present

## 2022-05-15 DIAGNOSIS — Z9181 History of falling: Secondary | ICD-10-CM | POA: Diagnosis not present

## 2022-05-15 DIAGNOSIS — Z7982 Long term (current) use of aspirin: Secondary | ICD-10-CM | POA: Diagnosis not present

## 2022-05-15 DIAGNOSIS — E1165 Type 2 diabetes mellitus with hyperglycemia: Secondary | ICD-10-CM | POA: Diagnosis not present

## 2022-05-15 DIAGNOSIS — J432 Centrilobular emphysema: Secondary | ICD-10-CM | POA: Diagnosis not present

## 2022-05-15 DIAGNOSIS — S31109D Unspecified open wound of abdominal wall, unspecified quadrant without penetration into peritoneal cavity, subsequent encounter: Secondary | ICD-10-CM | POA: Diagnosis not present

## 2022-05-15 DIAGNOSIS — Z87891 Personal history of nicotine dependence: Secondary | ICD-10-CM | POA: Diagnosis not present

## 2022-05-15 DIAGNOSIS — Z8673 Personal history of transient ischemic attack (TIA), and cerebral infarction without residual deficits: Secondary | ICD-10-CM | POA: Diagnosis not present

## 2022-05-15 DIAGNOSIS — Z89512 Acquired absence of left leg below knee: Secondary | ICD-10-CM | POA: Diagnosis not present

## 2022-05-15 NOTE — Telephone Encounter (Signed)
I called and spoke with stephanie and she stated he is falling a lot and she does not think it was the medication but she will let him know if he feels dizzy after his next dose to let us know.  Harshal Sirmon,cma

## 2022-05-16 ENCOUNTER — Encounter: Payer: Self-pay | Admitting: Family Medicine

## 2022-05-16 ENCOUNTER — Ambulatory Visit: Payer: Medicare Other | Admitting: Family Medicine

## 2022-05-17 DIAGNOSIS — Z87891 Personal history of nicotine dependence: Secondary | ICD-10-CM | POA: Diagnosis not present

## 2022-05-17 DIAGNOSIS — R531 Weakness: Secondary | ICD-10-CM | POA: Diagnosis not present

## 2022-05-17 DIAGNOSIS — Z8673 Personal history of transient ischemic attack (TIA), and cerebral infarction without residual deficits: Secondary | ICD-10-CM | POA: Diagnosis not present

## 2022-05-17 DIAGNOSIS — J432 Centrilobular emphysema: Secondary | ICD-10-CM | POA: Diagnosis not present

## 2022-05-17 DIAGNOSIS — Z89512 Acquired absence of left leg below knee: Secondary | ICD-10-CM | POA: Diagnosis not present

## 2022-05-17 DIAGNOSIS — Z9181 History of falling: Secondary | ICD-10-CM | POA: Diagnosis not present

## 2022-05-17 DIAGNOSIS — E1165 Type 2 diabetes mellitus with hyperglycemia: Secondary | ICD-10-CM | POA: Diagnosis not present

## 2022-05-17 DIAGNOSIS — J449 Chronic obstructive pulmonary disease, unspecified: Secondary | ICD-10-CM | POA: Diagnosis not present

## 2022-05-17 DIAGNOSIS — Z7902 Long term (current) use of antithrombotics/antiplatelets: Secondary | ICD-10-CM | POA: Diagnosis not present

## 2022-05-17 DIAGNOSIS — I10 Essential (primary) hypertension: Secondary | ICD-10-CM | POA: Diagnosis not present

## 2022-05-17 DIAGNOSIS — S31109D Unspecified open wound of abdominal wall, unspecified quadrant without penetration into peritoneal cavity, subsequent encounter: Secondary | ICD-10-CM | POA: Diagnosis not present

## 2022-05-17 DIAGNOSIS — Z7982 Long term (current) use of aspirin: Secondary | ICD-10-CM | POA: Diagnosis not present

## 2022-05-18 DIAGNOSIS — I619 Nontraumatic intracerebral hemorrhage, unspecified: Secondary | ICD-10-CM | POA: Diagnosis not present

## 2022-05-18 DIAGNOSIS — Z89512 Acquired absence of left leg below knee: Secondary | ICD-10-CM | POA: Diagnosis not present

## 2022-05-18 DIAGNOSIS — Z7902 Long term (current) use of antithrombotics/antiplatelets: Secondary | ICD-10-CM | POA: Diagnosis not present

## 2022-05-18 DIAGNOSIS — M6281 Muscle weakness (generalized): Secondary | ICD-10-CM | POA: Diagnosis not present

## 2022-05-18 DIAGNOSIS — Z7982 Long term (current) use of aspirin: Secondary | ICD-10-CM | POA: Diagnosis not present

## 2022-05-18 DIAGNOSIS — Z9181 History of falling: Secondary | ICD-10-CM | POA: Diagnosis not present

## 2022-05-18 DIAGNOSIS — Z8673 Personal history of transient ischemic attack (TIA), and cerebral infarction without residual deficits: Secondary | ICD-10-CM | POA: Diagnosis not present

## 2022-05-18 DIAGNOSIS — Z87891 Personal history of nicotine dependence: Secondary | ICD-10-CM | POA: Diagnosis not present

## 2022-05-18 DIAGNOSIS — J432 Centrilobular emphysema: Secondary | ICD-10-CM | POA: Diagnosis not present

## 2022-05-18 DIAGNOSIS — E1165 Type 2 diabetes mellitus with hyperglycemia: Secondary | ICD-10-CM | POA: Diagnosis not present

## 2022-05-18 DIAGNOSIS — S31109D Unspecified open wound of abdominal wall, unspecified quadrant without penetration into peritoneal cavity, subsequent encounter: Secondary | ICD-10-CM | POA: Diagnosis not present

## 2022-05-18 DIAGNOSIS — I10 Essential (primary) hypertension: Secondary | ICD-10-CM | POA: Diagnosis not present

## 2022-05-22 DIAGNOSIS — Z7982 Long term (current) use of aspirin: Secondary | ICD-10-CM | POA: Diagnosis not present

## 2022-05-22 DIAGNOSIS — Z87891 Personal history of nicotine dependence: Secondary | ICD-10-CM | POA: Diagnosis not present

## 2022-05-22 DIAGNOSIS — Z89512 Acquired absence of left leg below knee: Secondary | ICD-10-CM | POA: Diagnosis not present

## 2022-05-22 DIAGNOSIS — Z8673 Personal history of transient ischemic attack (TIA), and cerebral infarction without residual deficits: Secondary | ICD-10-CM | POA: Diagnosis not present

## 2022-05-22 DIAGNOSIS — Z7902 Long term (current) use of antithrombotics/antiplatelets: Secondary | ICD-10-CM | POA: Diagnosis not present

## 2022-05-22 DIAGNOSIS — E1165 Type 2 diabetes mellitus with hyperglycemia: Secondary | ICD-10-CM | POA: Diagnosis not present

## 2022-05-22 DIAGNOSIS — J432 Centrilobular emphysema: Secondary | ICD-10-CM | POA: Diagnosis not present

## 2022-05-22 DIAGNOSIS — I10 Essential (primary) hypertension: Secondary | ICD-10-CM | POA: Diagnosis not present

## 2022-05-22 DIAGNOSIS — S31109D Unspecified open wound of abdominal wall, unspecified quadrant without penetration into peritoneal cavity, subsequent encounter: Secondary | ICD-10-CM | POA: Diagnosis not present

## 2022-05-22 DIAGNOSIS — Z9181 History of falling: Secondary | ICD-10-CM | POA: Diagnosis not present

## 2022-05-23 ENCOUNTER — Other Ambulatory Visit: Payer: Self-pay | Admitting: Family Medicine

## 2022-05-23 ENCOUNTER — Telehealth: Payer: Self-pay

## 2022-05-23 DIAGNOSIS — Z89512 Acquired absence of left leg below knee: Secondary | ICD-10-CM | POA: Diagnosis not present

## 2022-05-23 DIAGNOSIS — E1165 Type 2 diabetes mellitus with hyperglycemia: Secondary | ICD-10-CM | POA: Diagnosis not present

## 2022-05-23 DIAGNOSIS — Z9181 History of falling: Secondary | ICD-10-CM | POA: Diagnosis not present

## 2022-05-23 DIAGNOSIS — J432 Centrilobular emphysema: Secondary | ICD-10-CM | POA: Diagnosis not present

## 2022-05-23 DIAGNOSIS — Z7982 Long term (current) use of aspirin: Secondary | ICD-10-CM | POA: Diagnosis not present

## 2022-05-23 DIAGNOSIS — Z7902 Long term (current) use of antithrombotics/antiplatelets: Secondary | ICD-10-CM | POA: Diagnosis not present

## 2022-05-23 DIAGNOSIS — S31109D Unspecified open wound of abdominal wall, unspecified quadrant without penetration into peritoneal cavity, subsequent encounter: Secondary | ICD-10-CM | POA: Diagnosis not present

## 2022-05-23 DIAGNOSIS — Z8673 Personal history of transient ischemic attack (TIA), and cerebral infarction without residual deficits: Secondary | ICD-10-CM | POA: Diagnosis not present

## 2022-05-23 DIAGNOSIS — I10 Essential (primary) hypertension: Secondary | ICD-10-CM | POA: Diagnosis not present

## 2022-05-23 DIAGNOSIS — Z87891 Personal history of nicotine dependence: Secondary | ICD-10-CM | POA: Diagnosis not present

## 2022-05-23 MED ORDER — INSULIN ASPART 100 UNIT/ML IJ SOLN: 0.0000 [IU] | Freq: Three times a day (TID) | INTRAMUSCULAR | 11 refills | Status: DC

## 2022-05-23 NOTE — Telephone Encounter (Signed)
Danny [name] called back and I informed her that the patient was replacing the mounjaro for the Trulicity and he should have stopped the trulicity.  I informed her that the provider refilled the Novolog and she understood.  Yliana Gravois,cma

## 2022-05-23 NOTE — Telephone Encounter (Signed)
Noted.  He was supposed to stop the Trulicity and then start the [facility] in place of the Trulicity.  He was supposed to remain on NovoLog.  I sent a refill of the NovoLog to the pharmacy.

## 2022-05-23 NOTE — Telephone Encounter (Signed)
LVM for Alden Benjamin of [facility] to call back.   Jelitza Manninen,cma
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
## 2022-05-24 ENCOUNTER — Other Ambulatory Visit: Payer: Self-pay

## 2022-05-24 DIAGNOSIS — I619 Nontraumatic intracerebral hemorrhage, unspecified: Secondary | ICD-10-CM | POA: Diagnosis not present

## 2022-05-24 DIAGNOSIS — M6281 Muscle weakness (generalized): Secondary | ICD-10-CM | POA: Diagnosis not present

## 2022-05-24 MED ORDER — INSULIN LISPRO (1 UNIT DIAL) 100 UNIT/ML (KWIKPEN): PEN_INJECTOR | SUBCUTANEOUS | 11 refills | Status: DC

## 2022-05-25 DIAGNOSIS — Z9181 History of falling: Secondary | ICD-10-CM | POA: Diagnosis not present

## 2022-05-25 DIAGNOSIS — Z8673 Personal history of transient ischemic attack (TIA), and cerebral infarction without residual deficits: Secondary | ICD-10-CM | POA: Diagnosis not present

## 2022-05-25 DIAGNOSIS — I10 Essential (primary) hypertension: Secondary | ICD-10-CM | POA: Diagnosis not present

## 2022-05-25 DIAGNOSIS — Z89512 Acquired absence of left leg below knee: Secondary | ICD-10-CM | POA: Diagnosis not present

## 2022-05-25 DIAGNOSIS — E1165 Type 2 diabetes mellitus with hyperglycemia: Secondary | ICD-10-CM | POA: Diagnosis not present

## 2022-05-25 DIAGNOSIS — S31109D Unspecified open wound of abdominal wall, unspecified quadrant without penetration into peritoneal cavity, subsequent encounter: Secondary | ICD-10-CM | POA: Diagnosis not present

## 2022-05-25 DIAGNOSIS — J432 Centrilobular emphysema: Secondary | ICD-10-CM | POA: Diagnosis not present

## 2022-05-25 DIAGNOSIS — Z7982 Long term (current) use of aspirin: Secondary | ICD-10-CM | POA: Diagnosis not present

## 2022-05-25 DIAGNOSIS — Z87891 Personal history of nicotine dependence: Secondary | ICD-10-CM | POA: Diagnosis not present

## 2022-05-25 DIAGNOSIS — Z7902 Long term (current) use of antithrombotics/antiplatelets: Secondary | ICD-10-CM | POA: Diagnosis not present

## 2022-05-27 ENCOUNTER — Other Ambulatory Visit: Payer: Self-pay | Admitting: Family Medicine

## 2022-05-27 NOTE — Progress Notes (Deleted)
Date:  05/27/2022   ID:  Danny Organ Sr., DOB 1951/01/01, MRN [id number]  Patient Location:  [address] [place] [address]   Provider location:   [facility], [place] office  PCP:  [name], MD  Cardiologist:  [name]   No chief complaint on file.   History of Present Illness:    Danny DOVIDIO Sr. is a 71 y.o. male  past medical history of smoke 4 packs a day who stopped earlier in 2011,  coronary artery disease,  cardiac catheterization showing severe two-vessel disease,  severe stenosis of the proximal to mid LAD, severe ostial O1 and O2 disease, moderate to severe mid left circumflex disease that appeared aneurysmal, moderate OM 2 disease ejection fraction 40%  PCI of the LAD on January 11, 2010,  Chronic renal insufficiency,  poorly controlled diabetes on insulin,  Severe PAD, left foot gangrene, amputation March 2022 hyperlipidemia who presents for routine followup of his coronary artery disease.  Last seen in clinic by myself March 2021 Seen by one of our providers March 2022  Has undergone PV procedures with Dr.Dew: April 2010   April 2017   Percutaneous transluminal angioplasty of right peroneal artery with 3 mm diameter by 15 cm length angioplasty balloon Mechanical thrombectomy to the right SFA and popliteal artery to debulk chronic thrombus from the occlusion with the [place] Rex device  Percutaneous transluminal angioplasty of the right distal SFA and above-knee popliteal artery with 5 mm diameter by 15 cm length Lutonix drug-coated angioplasty balloon Viabahn stent placement to the right popliteal artery with 6 mm diameter by 5 cm length Viabahn stent     covid positive 07/2019, COVID-19 viral pneumonia  hemoglobin A1c 11.2.   pt oxygen saturation went down to 88%, CXR: hazy bilateral peripheral opacities Remdesivir and steroids. Records reviewed  Labs reviewed CR 1.4  Mother with covid,  died  Total chol 164, LDL 63 last year 08/2018  Previously seen by Dr. Lucky Cowboy  lower extremity angiography Has not followed up   EKG personally reviewed by myself on todays visit NSR rate 95 bpm, T wave ABn anterolateral leads, I and AVL Same as 10/2017   Prior CV studies:   The following studies were reviewed today:    Past Medical History:  Diagnosis Date   CAD (coronary artery disease)    a. 01/2010 PCI of LAD; b. 09/2014 PCI/DES of mLAD due to ISR. D1 80, D2 80(jailed), LCX 8m; c. 07/2016 NSTEMI/Cath: LM nl, LAD 43m ISR, 40d, D1 90ost, 80p, D2 90ost, RI min irregs, LCX min irregs, OM1 90 small, OM2/3 min irregs, RCA min irregs, RPLB1 90, EF 25-35%.   Chronic combined systolic and diastolic CHF (congestive heart failure) ([place])    a. 07/2016 Echo: EF 30%, severe septal/anterior HK, Gr1 DD.   CKD (chronic kidney disease), stage III (HCC)    COPD (chronic obstructive pulmonary disease) (HCC)    DM2 (diabetes mellitus, type 2) (HCC)    Erectile dysfunction    HLD (hyperlipidemia)    HTN (hypertension)    Ischemic cardiomyopathy    a. 07/2016 Echo: EF 30% w/ sev septal/ant HK. Gr1 DD.   Past Surgical History:  Procedure Laterality Date   AMPUTATION Left 11/10/2020   Procedure: AMPUTATION BELOW KNEE;  Surgeon: [name], MD;  Location: [facility];  Service: General;  Laterality: Left;   BELOW KNEE LEG AMPUTATION     CAD: stent to the  LAD     CARDIAC CATHETERIZATION  10/01/2014   CARDIAC CATHETERIZATION N/A 07/23/2016   Procedure: Left Heart Cath and Coronary Angiography;  Surgeon: [name], MD;  Location: [place] [facility];  Service: Cardiovascular;  Laterality: N/A;   CORONARY ANGIOPLASTY WITH STENT PLACEMENT  10/01/2014   LOWER EXTREMITY ANGIOGRAPHY Left 10/31/2020   Procedure: LOWER EXTREMITY ANGIOGRAPHY;  Surgeon: [name], MD;  Location: [place] [facility];  Service: Cardiovascular;  Laterality: Left;   LOWER EXTREMITY ANGIOGRAPHY Right 11/20/2021   Procedure:  Lower Extremity Angiography;  Surgeon: [name], MD;  Location: [place] [facility];  Service: Cardiovascular;  Laterality: Right;     No outpatient medications have been marked as taking for the 05/28/22 encounter (Appointment) with [name], MD.     Allergies:   Metformin and related   Social History   Tobacco Use   Smoking status: Former    Packs/day: 3.00    Years: 0.00    Total pack years: 0.00    Types: Cigarettes    Quit date: 09/07/2009    Years since quitting: 12.7   Smokeless tobacco: Never   Tobacco comments:    quit june 2011  Vaping Use   Vaping Use: Never used  Substance Use Topics   Alcohol use: Not Currently    Alcohol/week: 7.0 standard drinks of alcohol    Types: 6 Cans of beer, 1 Standard drinks or equivalent per week    Comment: every other weekend   Drug use: Never     Current Outpatient Medications on File Prior to Visit  Medication Sig Dispense Refill   acetaminophen (TYLENOL) 325 MG tablet Take 2 tablets (650 mg total) by mouth every 4 (four) hours as needed for mild pain (or temp > 37.5 C (99.5 F)).     albuterol (PROVENTIL) (2.5 MG/3ML) 0.083% nebulizer solution Take 3 mLs (2.5 mg total) by nebulization every 6 (six) hours as needed for wheezing or shortness of breath. 150 mL 1   Albuterol Sulfate (PROAIR RESPICLICK) 123XX123 (90 Base) MCG/ACT AEPB Inhale 1 puff into the lungs every 6 (six) hours as needed. 1 each 12   amLODipine (NORVASC) 5 MG tablet Take 1 tablet (5 mg total) by mouth daily. 30 tablet 0   aspirin EC 81 MG tablet Take 1 tablet (81 mg total) by mouth daily. 150 tablet 2   atorvastatin (LIPITOR) 80 MG tablet Take 1 tablet (80 mg total) by mouth daily. 90 tablet 3   butalbital-acetaminophen-caffeine (FIORICET) 50-325-40 MG tablet Take 1 tablet by mouth every 8 (eight) hours as needed for headache. 14 tablet 0   carvedilol (COREG) 25 MG tablet TAKE 1 TABLET BY MOUTH TWICE  DAILY WITH MEALS 60 tablet 11   clopidogrel (PLAVIX) 75  MG tablet Take 1 tablet (75 mg total) by mouth daily. 30 tablet 11   Continuous Blood Gluc Receiver (FREESTYLE LIBRE 2 READER) DEVI 1 Device by Does not apply route 4 (four) times daily. 1 each 0   Continuous Blood Gluc Sensor (FREESTYLE LIBRE 2 SENSOR) MISC USE 1 SENSOR EVERY 14 DAYS 6 each 0   empagliflozin (JARDIANCE) 25 MG TABS tablet Take 1 tablet (25 mg total) by mouth daily. 90 tablet 3   escitalopram (LEXAPRO) 10 MG tablet Take 1 tablet (10 mg total) by mouth daily. 90 tablet 1   fluticasone-salmeterol (ADVAIR) 250-50 MCG/ACT AEPB Inhale 1 puff into the lungs 2 (two) times daily. 3 each 1   furosemide (LASIX)  20 MG tablet TAKE 1 TABLET (20MG ) BY MOUTH ONCE EVERY OTHER DAY. 45 tablet 3   gabapentin (NEURONTIN) 300 MG capsule Take 1 capsule (300 mg total) by mouth at bedtime. 30 capsule 3   HYDROcodone-acetaminophen (NORCO/VICODIN) 5-325 MG tablet Take 1 tablet by mouth every 4 (four) hours as needed for moderate pain. 20 tablet 0   Incontinence Supply Disposable (PROCARE ADULT BRIEFS X-LARGE) MISC 1 Application by Does not apply route as needed (For incontinence). 50 each 3   insulin lispro (HUMALOG KWIKPEN) 100 UNIT/ML KwikPen Inject 0-15 Units into the skin 4 (four) times daily -  before meals and at bedtime. CBG < 70: Implement Hypoglycemia Standing Orders and refer to Hypoglycemia Standing Orders sidebar report CBG 70 - 120: 0 units CBG 121 - 150: 2 units CBG 151 - 200: 3 units CBG 201 - 250: 5 units CBG 251 - 300: 8 units CBG 301 - 350: 11 units CBG 351 - 400: 15 units CBG > 400: call MD 15 mL 11   isosorbide mononitrate (IMDUR) 30 MG 24 hr tablet TAKE 1 TABLET BY MOUTH EVERY DAY 90 tablet 3   nitroGLYCERIN (NITROSTAT) 0.4 MG SL tablet Place 0.4 mg under the tongue every 5 (five) minutes as needed for chest pain.     nystatin ointment (MYCOSTATIN) Apply 1 Application topically 2 (two) times daily. 30 g 0   ondansetron (ZOFRAN) 4 MG tablet Take 4 mg by mouth every 6 (six) hours as needed.      ondansetron (ZOFRAN-ODT) 4 MG disintegrating tablet Allow 1-2 tablets to dissolve in your mouth every 8 hours as needed for nausea/vomiting 30 tablet 0   sacubitril-valsartan (ENTRESTO) 49-51 MG TAKE 1 TABLET BY MOUTH 2 (TWO) TIMES DAILY. STOP LISINOPRIL AND POTASSIUM 180 tablet 1   spironolactone (ALDACTONE) 25 MG tablet Take 1 tablet (25 mg total) by mouth daily. 90 tablet 1   tamsulosin (FLOMAX) 0.4 MG CAPS capsule Take 1 capsule (0.4 mg total) by mouth daily. 90 capsule 1   tirzepatide (MOUNJARO) 2.5 MG/0.5ML Pen Inject 2.5 mg into the skin once a week for 28 days. 2 mL 0   tirzepatide (MOUNJARO) 5 MG/0.5ML Pen Inject 5 mg into the skin once a week. Start after you finish the 28 day course of the 2.5 mg dose. 6 mL 3   No current facility-administered medications on file prior to visit.     Family Hx: The patient's family history includes Cancer in his brother; Heart attack in his father.  ROS:   Please see the history of present illness.    Review of Systems  Constitutional: Negative.   HENT: Negative.    Respiratory:  Positive for shortness of breath.   Cardiovascular: Negative.   Gastrointestinal: Negative.   Musculoskeletal:  Positive for back pain and joint pain.  Neurological: Negative.   Psychiatric/Behavioral: Negative.    All other systems reviewed and are negative.     Labs/Other Tests and Data Reviewed:    Recent Labs: 09/30/2021: B Natriuretic Peptide 271.2 10/10/2021: Magnesium 2.2 02/19/2022: ALT 32 02/27/2022: Hemoglobin 14.3; Platelets 376 04/27/2022: BUN 52; Creatinine, Ser 1.43; Potassium 4.0; Sodium 140   Recent Lipid Panel Lab Results  Component Value Date/Time   CHOL 112 10/03/2021 10:18 AM   TRIG 147 10/03/2021 10:18 AM   HDL 44 10/03/2021 10:18 AM   CHOLHDL 2.5 10/03/2021 10:18 AM   LDLCALC 39 10/03/2021 10:18 AM   LDLDIRECT 64.0 03/20/2018 09:50 AM    Wt Readings from Last 3  Encounters:  04/27/22 236 lb (107 kg)  02/27/22 200 lb (90.7 kg)   02/19/22 199 lb 15.3 oz (90.7 kg)     Exam:    Vital Signs: Vital signs may also be detailed in the HPI There were no vitals taken for this visit. Constitutional:  oriented to person, place, and time. No distress.  HENT:  Head: Grossly normal Eyes:  no discharge. No scleral icterus.  Neck: No JVD, no carotid bruits  Cardiovascular: Regular rate and rhythm, no murmurs appreciated Pulmonary/Chest: Clear to auscultation bilaterally, no wheezes or rails Abdominal: Soft.  no distension.  no tenderness.  Musculoskeletal: Normal range of motion Neurological:  normal muscle tone. Coordination normal. No atrophy Skin: Skin warm and dry Psychiatric: normal affect, pleasant   ASSESSMENT & PLAN:    Chronic combined systolic and diastolic CHF (congestive heart failure) (HCC) Appears relatively euvolemic on today's visit Shortness of breath likely secondary to recovery from Covid and emphysema He is on inhalers No changes to his medications  Coronary artery disease of native artery of native heart with stable angina pectoris (HCC) Stressed importance of diabetes control Poor diet, worsening numbers recently in the setting of Covid and received steroids Continue statin , Zetia, beta-blocker, long-acting nitrate  Centrilobular emphysema ([place]) Stop smoking many years ago, now on inhalers post Covid  Essential hypertension Did not take his medications this morning, long walk stressful getting into the office  Recommend he closely monitor blood pressure at home  Diabetes mellitus type 2, uncontrolled, with complications ([place]) Recommend he go back to his strict diet  CKD (chronic kidney disease) stage 3, GFR 30-59 ml/min (HCC) Stable renal function  Pure hypercholesterolemia Stressed importance of aggressive diabetes control   Total encounter time more than 25 minutes  Greater than 50% was spent in counseling and coordination of care with the patient   Signed, Ida Rogue, MD   05/27/2022 12:08 PM    [facility] [facility] [address], [place], [place] [address]

## 2022-05-28 ENCOUNTER — Other Ambulatory Visit: Payer: Self-pay

## 2022-05-28 ENCOUNTER — Telehealth: Payer: Self-pay | Admitting: Family Medicine

## 2022-05-28 ENCOUNTER — Ambulatory Visit: Payer: Medicare Other | Attending: Cardiovascular Disease | Admitting: Cardiovascular Disease

## 2022-05-28 DIAGNOSIS — N183 Chronic kidney disease, stage 3 unspecified: Secondary | ICD-10-CM

## 2022-05-28 DIAGNOSIS — R0600 Dyspnea, unspecified: Secondary | ICD-10-CM

## 2022-05-28 DIAGNOSIS — I5042 Chronic combined systolic (congestive) and diastolic (congestive) heart failure: Secondary | ICD-10-CM

## 2022-05-28 DIAGNOSIS — I255 Ischemic cardiomyopathy: Secondary | ICD-10-CM

## 2022-05-28 DIAGNOSIS — I779 Disorder of arteries and arterioles, unspecified: Secondary | ICD-10-CM

## 2022-05-28 DIAGNOSIS — E1169 Type 2 diabetes mellitus with other specified complication: Secondary | ICD-10-CM

## 2022-05-28 DIAGNOSIS — J432 Centrilobular emphysema: Secondary | ICD-10-CM

## 2022-05-28 DIAGNOSIS — I70299 Other atherosclerosis of native arteries of extremities, unspecified extremity: Secondary | ICD-10-CM

## 2022-05-28 DIAGNOSIS — I25118 Atherosclerotic heart disease of native coronary artery with other forms of angina pectoris: Secondary | ICD-10-CM

## 2022-05-28 DIAGNOSIS — E785 Hyperlipidemia, unspecified: Secondary | ICD-10-CM

## 2022-05-28 MED ORDER — BD PEN NEEDLE MICRO U/F 32G X 6 MM MISC: 11 refills | Status: DC

## 2022-05-28 NOTE — Telephone Encounter (Signed)
FILL SENT TO PHARMACY.  Zylen Wenig,CMA

## 2022-05-28 NOTE — Telephone Encounter (Signed)
Patient needs a refill on his needles.

## 2022-05-29 ENCOUNTER — Encounter: Payer: Self-pay | Admitting: Cardiovascular Disease

## 2022-05-29 DIAGNOSIS — J432 Centrilobular emphysema: Secondary | ICD-10-CM | POA: Diagnosis not present

## 2022-05-29 DIAGNOSIS — Z8673 Personal history of transient ischemic attack (TIA), and cerebral infarction without residual deficits: Secondary | ICD-10-CM | POA: Diagnosis not present

## 2022-05-29 DIAGNOSIS — E1165 Type 2 diabetes mellitus with hyperglycemia: Secondary | ICD-10-CM | POA: Diagnosis not present

## 2022-05-29 DIAGNOSIS — I10 Essential (primary) hypertension: Secondary | ICD-10-CM | POA: Diagnosis not present

## 2022-05-29 DIAGNOSIS — Z87891 Personal history of nicotine dependence: Secondary | ICD-10-CM | POA: Diagnosis not present

## 2022-05-29 DIAGNOSIS — Z7982 Long term (current) use of aspirin: Secondary | ICD-10-CM | POA: Diagnosis not present

## 2022-05-29 DIAGNOSIS — Z9181 History of falling: Secondary | ICD-10-CM | POA: Diagnosis not present

## 2022-05-29 DIAGNOSIS — Z7902 Long term (current) use of antithrombotics/antiplatelets: Secondary | ICD-10-CM | POA: Diagnosis not present

## 2022-05-29 DIAGNOSIS — Z89512 Acquired absence of left leg below knee: Secondary | ICD-10-CM | POA: Diagnosis not present

## 2022-05-29 DIAGNOSIS — S31109D Unspecified open wound of abdominal wall, unspecified quadrant without penetration into peritoneal cavity, subsequent encounter: Secondary | ICD-10-CM | POA: Diagnosis not present

## 2022-05-30 DIAGNOSIS — Z87891 Personal history of nicotine dependence: Secondary | ICD-10-CM | POA: Diagnosis not present

## 2022-05-30 DIAGNOSIS — E1165 Type 2 diabetes mellitus with hyperglycemia: Secondary | ICD-10-CM | POA: Diagnosis not present

## 2022-05-30 DIAGNOSIS — Z7902 Long term (current) use of antithrombotics/antiplatelets: Secondary | ICD-10-CM | POA: Diagnosis not present

## 2022-05-30 DIAGNOSIS — S31109D Unspecified open wound of abdominal wall, unspecified quadrant without penetration into peritoneal cavity, subsequent encounter: Secondary | ICD-10-CM | POA: Diagnosis not present

## 2022-05-30 DIAGNOSIS — Z8673 Personal history of transient ischemic attack (TIA), and cerebral infarction without residual deficits: Secondary | ICD-10-CM | POA: Diagnosis not present

## 2022-05-30 DIAGNOSIS — Z7982 Long term (current) use of aspirin: Secondary | ICD-10-CM | POA: Diagnosis not present

## 2022-05-30 DIAGNOSIS — Z89512 Acquired absence of left leg below knee: Secondary | ICD-10-CM | POA: Diagnosis not present

## 2022-05-30 DIAGNOSIS — I10 Essential (primary) hypertension: Secondary | ICD-10-CM | POA: Diagnosis not present

## 2022-05-30 DIAGNOSIS — Z9181 History of falling: Secondary | ICD-10-CM | POA: Diagnosis not present

## 2022-05-30 DIAGNOSIS — J432 Centrilobular emphysema: Secondary | ICD-10-CM | POA: Diagnosis not present

## 2022-05-31 ENCOUNTER — Emergency Department: Payer: Medicare Other

## 2022-05-31 ENCOUNTER — Emergency Department
Admission: EM | Admit: 2022-05-31 | Discharge: 2022-05-31 | Disposition: A | Discharge status: Home / Self Care | Payer: Medicare Other | Attending: Emergency Medicine | Admitting: Emergency Medicine

## 2022-05-31 ENCOUNTER — Other Ambulatory Visit: Payer: Self-pay

## 2022-05-31 DIAGNOSIS — S31109D Unspecified open wound of abdominal wall, unspecified quadrant without penetration into peritoneal cavity, subsequent encounter: Secondary | ICD-10-CM | POA: Diagnosis not present

## 2022-05-31 DIAGNOSIS — R0600 Dyspnea, unspecified: Secondary | ICD-10-CM | POA: Diagnosis not present

## 2022-05-31 DIAGNOSIS — J45909 Unspecified asthma, uncomplicated: Secondary | ICD-10-CM | POA: Insufficient documentation

## 2022-05-31 DIAGNOSIS — I10 Essential (primary) hypertension: Secondary | ICD-10-CM | POA: Diagnosis not present

## 2022-05-31 DIAGNOSIS — Z89512 Acquired absence of left leg below knee: Secondary | ICD-10-CM | POA: Diagnosis not present

## 2022-05-31 DIAGNOSIS — I509 Heart failure, unspecified: Secondary | ICD-10-CM | POA: Diagnosis not present

## 2022-05-31 DIAGNOSIS — R4182 Altered mental status, unspecified: Secondary | ICD-10-CM | POA: Insufficient documentation

## 2022-05-31 DIAGNOSIS — R0602 Shortness of breath: Secondary | ICD-10-CM | POA: Diagnosis not present

## 2022-05-31 DIAGNOSIS — Z1152 Encounter for screening for COVID-19: Secondary | ICD-10-CM | POA: Insufficient documentation

## 2022-05-31 DIAGNOSIS — J449 Chronic obstructive pulmonary disease, unspecified: Secondary | ICD-10-CM | POA: Insufficient documentation

## 2022-05-31 DIAGNOSIS — R911 Solitary pulmonary nodule: Secondary | ICD-10-CM | POA: Diagnosis not present

## 2022-05-31 DIAGNOSIS — Z7982 Long term (current) use of aspirin: Secondary | ICD-10-CM | POA: Diagnosis not present

## 2022-05-31 DIAGNOSIS — R0689 Other abnormalities of breathing: Secondary | ICD-10-CM | POA: Diagnosis not present

## 2022-05-31 DIAGNOSIS — E162 Hypoglycemia, unspecified: Secondary | ICD-10-CM | POA: Diagnosis not present

## 2022-05-31 DIAGNOSIS — Z7902 Long term (current) use of antithrombotics/antiplatelets: Secondary | ICD-10-CM | POA: Diagnosis not present

## 2022-05-31 DIAGNOSIS — Z9181 History of falling: Secondary | ICD-10-CM | POA: Diagnosis not present

## 2022-05-31 DIAGNOSIS — R6889 Other general symptoms and signs: Secondary | ICD-10-CM | POA: Diagnosis not present

## 2022-05-31 DIAGNOSIS — Z743 Need for continuous supervision: Secondary | ICD-10-CM | POA: Diagnosis not present

## 2022-05-31 DIAGNOSIS — Z8673 Personal history of transient ischemic attack (TIA), and cerebral infarction without residual deficits: Secondary | ICD-10-CM | POA: Diagnosis not present

## 2022-05-31 DIAGNOSIS — E1165 Type 2 diabetes mellitus with hyperglycemia: Secondary | ICD-10-CM | POA: Diagnosis not present

## 2022-05-31 DIAGNOSIS — Z87891 Personal history of nicotine dependence: Secondary | ICD-10-CM | POA: Diagnosis not present

## 2022-05-31 DIAGNOSIS — J432 Centrilobular emphysema: Secondary | ICD-10-CM | POA: Diagnosis not present

## 2022-05-31 DIAGNOSIS — E11649 Type 2 diabetes mellitus with hypoglycemia without coma: Secondary | ICD-10-CM | POA: Diagnosis not present

## 2022-05-31 DIAGNOSIS — R069 Unspecified abnormalities of breathing: Secondary | ICD-10-CM | POA: Diagnosis not present

## 2022-05-31 LAB — COMPREHENSIVE METABOLIC PANEL
ALT: 35 U/L (ref 0–44)
AST: 27 U/L (ref 15–41)
Albumin: 3.7 g/dL (ref 3.5–5.0)
Alkaline Phosphatase: 84 U/L (ref 38–126)
Anion gap: 8 (ref 5–15)
BUN: 40 mg/dL — ABNORMAL HIGH (ref 8–23)
CO2: 20 mmol/L — ABNORMAL LOW (ref 22–32)
Calcium: 9.3 mg/dL (ref 8.9–10.3)
Chloride: 112 mmol/L — ABNORMAL HIGH (ref 98–111)
Creatinine, Ser: 1.15 mg/dL (ref 0.61–1.24)
GFR, Estimated: >60 mL/min (ref >60)
Glucose, Bld: 57 mg/dL — ABNORMAL LOW (ref 70–99)
Potassium: 4.2 mmol/L (ref 3.5–5.1)
Sodium: 140 mmol/L (ref 135–145)
Total Bilirubin: 0.8 mg/dL (ref 0.3–1.2)
Total Protein: 6.7 g/dL (ref 6.5–8.1)

## 2022-05-31 LAB — CBC
HCT: 46.8 % (ref 39.0–52.0)
Hemoglobin: 14.8 g/dL (ref 13.0–17.0)
MCH: 30.7 pg (ref 26.0–34.0)
MCHC: 31.6 g/dL (ref 30.0–36.0)
MCV: 97.1 fL (ref 80.0–100.0)
Platelets: 220 10*3/uL (ref 150–400)
RBC: 4.82 MIL/uL (ref 4.22–5.81)
RDW: 13.7 % (ref 11.5–15.5)
WBC: 8.8 10*3/uL (ref 4.0–10.5)
nRBC: 0 % (ref 0.0–0.2)

## 2022-05-31 LAB — BRAIN NATRIURETIC PEPTIDE: B Natriuretic Peptide: 121.4 pg/mL — ABNORMAL HIGH (ref 0.0–100.0)

## 2022-05-31 LAB — RESP PANEL BY RT-PCR (FLU A&B, COVID) ARPGX2
Influenza A by PCR: NEGATIVE
Influenza B by PCR: NEGATIVE
SARS Coronavirus 2 by RT PCR: NEGATIVE

## 2022-05-31 LAB — CBG MONITORING, ED
Glucose-Capillary: 64 mg/dL — ABNORMAL LOW (ref 70–99)
Glucose-Capillary: 41 mg/dL — CL (ref 70–99)
Glucose-Capillary: 178 mg/dL — ABNORMAL HIGH (ref 70–99)

## 2022-05-31 MED ORDER — DEXTROSE 50 % IV SOLN
1.0000 | Freq: Once | INTRAVENOUS | Status: AC
  Administered 2022-05-31: 50 mL via INTRAVENOUS

## 2022-05-31 MED ORDER — DEXTROSE 50 % IV SOLN
INTRAVENOUS | Status: AC
  Filled 2022-05-31: qty 50

## 2022-05-31 MED ORDER — DEXTROSE 50 % IV SOLN
12.5000 g | Freq: Once | INTRAVENOUS | Status: AC
  Administered 2022-05-31: 12.5 g via INTRAVENOUS

## 2022-05-31 MED ORDER — SODIUM CHLORIDE 0.9 % IV BOLUS
1000.0000 mL | Freq: Once | INTRAVENOUS | Status: AC
  Administered 2022-05-31: 1000 mL via INTRAVENOUS

## 2022-05-31 MED ORDER — BUTALBITAL-APAP-CAFFEINE 50-325-40 MG PO TABS
2.0000 | ORAL_TABLET | Freq: Once | ORAL | Status: AC
  Administered 2022-05-31: 2 via ORAL
  Filled 2022-05-31: qty 2

## 2022-05-31 MED ORDER — IOHEXOL 350 MG/ML SOLN
100.0000 mL | Freq: Once | INTRAVENOUS | Status: AC | PRN
  Administered 2022-05-31: 100 mL via INTRAVENOUS

## 2022-05-31 NOTE — ED Notes (Signed)
Peri care performed on patient. Brief and barrier pads replaced.

## 2022-05-31 NOTE — ED Notes (Signed)
RN on phone with son for update - given permission by pt to do so

## 2022-05-31 NOTE — ED Provider Notes (Signed)
[facility] Provider Note   Event Date/Time   First MD Initiated Contact with Patient 05/31/22 1456     (approximate) History  Altered Mental Status  HPI Danny DELLIS Sr. is a 71 y.o. male with stated past medical history of CHF, MI, and CVA who presents for shortness of breath after awakening approximately 2 hours prior to arrival.  Patient states that he was in normal state of health until he went back to sleep after waking up this morning and feeling extremely short of breath and like he could not get a good deep breath in.  Patient denies any recent travel or sick contacts ROS: Patient currently denies any vision changes, tinnitus, difficulty speaking, facial droop, sore throat, chest pain, abdominal pain, nausea/vomiting/diarrhea, dysuria, or weakness/numbness/paresthesias in any extremity   Physical Exam  Triage Vital Signs: ED Triage Vitals  Enc Vitals Group     BP 05/31/22 1413 93/67     Pulse Rate 05/31/22 1413 76     Resp 05/31/22 1413 (!) 22     Temp 05/31/22 1413 99.3 F (37.4 C)     Temp Source 05/31/22 1413 Oral     SpO2 05/31/22 1413 99 %     Weight 05/31/22 1500 235 lb 14.3 oz (107 kg)     Height 05/31/22 1500 6' (1.829 m)     Head Circumference --      Peak Flow --      Pain Score 05/31/22 1414 10     Pain Loc --      Pain Edu? --      Excl. in [place]? --    Most recent vital signs: Vitals:   05/31/22 1730 05/31/22 1800  BP: 122/70 124/85  Pulse: 71 70  Resp: 17 15  Temp:    SpO2: 98% 94%   General: Awake, oriented x4. CV:  Good peripheral perfusion.  Resp:  Normal effort.  Mild Rales over bilateral lung fields.  Tachypneic Abd:  No distention.  Other:  Elderly obese Caucasian male laying in bed in mild respiratory distress ED Results / Procedures / Treatments  Labs (all labs ordered are listed, but only abnormal results are displayed) Labs Reviewed  COMPREHENSIVE METABOLIC PANEL - Abnormal; Notable for the following  components:      Result Value   Chloride 112 (*)    CO2 20 (*)    Glucose, Bld 57 (*)    BUN 40 (*)    All other components within normal limits  BRAIN NATRIURETIC PEPTIDE - Abnormal; Notable for the following components:   B Natriuretic Peptide 121.4 (*)    All other components within normal limits  CBG MONITORING, ED - Abnormal; Notable for the following components:   Glucose-Capillary 64 (*)    All other components within normal limits  CBG MONITORING, ED - Abnormal; Notable for the following components:   Glucose-Capillary 41 (*)    All other components within normal limits  CBG MONITORING, ED - Abnormal; Notable for the following components:   Glucose-Capillary 178 (*)    All other components within normal limits  RESP PANEL BY RT-PCR (FLU A&B, COVID) ARPGX2  CBC   EKG ED ECG REPORT I, Naaman Plummer, the attending physician, personally viewed and interpreted this ECG. Date: 05/31/2022 EKG Time: 1413 Rate: 75 Rhythm: normal sinus rhythm QRS Axis: normal Intervals: normal ST/T Wave abnormalities: normal Narrative Interpretation: no evidence of acute ischemia RADIOLOGY ED MD interpretation: CT angiography of the chest interpreted  by me and shows no evidence of pulmonary embolism however there is a new right lower lobe pulmonary nodule measuring 6 mm which may be infectious -Agree with radiology assessment  One-view portable chest x-ray shows mild scarring/atelectasis in the mid and lower lungs bilaterally Official radiology report(s): CT Angio Chest PE W/Cm &/Or Wo Cm  Result Date: 05/31/2022 CLINICAL DATA:  Altered mental status and dyspnea EXAM: CT ANGIOGRAPHY CHEST WITH CONTRAST TECHNIQUE: Multidetector CT imaging of the chest was performed using the standard protocol during bolus administration of intravenous contrast. Multiplanar CT image reconstructions and MIPs were obtained to evaluate the vascular anatomy. RADIATION DOSE REDUCTION: This exam was performed  according to the departmental dose-optimization program which includes automated exposure control, adjustment of the mA and/or kV according to patient size and/or use of iterative reconstruction technique. CONTRAST:  125mL OMNIPAQUE IOHEXOL 350 MG/ML SOLN COMPARISON:  CT chest dated 02/07/2022, chest radiograph dated 05/31/2022 FINDINGS: Cardiovascular: The study is high quality for the evaluation of pulmonary embolism. There are no filling defects in the central, lobar, segmental or subsegmental pulmonary artery branches to suggest acute pulmonary embolism. Great vessels are normal in course and caliber. Normal heart size.No significant pericardial fluid/thickening. Coronary artery calcifications. Aortic atherosclerosis. Mediastinum/Nodes: Partially imaged thyroid gland without nodules meeting criteria for imaging follow-up by size. Normal esophagus. No pathologically enlarged axillary, supraclavicular, mediastinal, or hilar lymph nodes. Lungs/Pleura: The central airways are patent. Severe upper lobe predominant centrilobular and paraseptal emphysema. Similar right upper lobe scarring and traction bronchiectasis. Similar right-greater-than-left lower lobe bronchiectasis. New right lower lobe pulmonary nodules measuring up to 6 mm (5:133). No pneumothorax. No pleural effusion. Upper abdomen: Normal. Musculoskeletal: No acute or abnormal lytic or blastic osseous lesions. Flowing anterior osteophytes over at least 4 contiguous thoracic vertebrae which can be seen in the setting of diffuse idiopathic skeletal hyperostosis (DISH). Review of the MIP images confirms the above findings. IMPRESSION: 1. No acute pulmonary embolism. 2. New right lower lobe pulmonary nodules measuring up to 6 mm, which may be infectious/inflammatory. Non-contrast chest CT at 3-6 months is recommended. If the nodules are stable at time of repeat CT, then future CT at 18-24 months (from today's scan) is considered optional for low-risk patients,  but is recommended for high-risk patients. This recommendation follows the consensus statement: Guidelines for Management of Incidental Pulmonary Nodules Detected on CT Images: From the Fleischner Society 2017; Radiology 2017; 284:228-243. 3. Aortic Atherosclerosis (ICD10-I70.0) and Emphysema (ICD10-J43.9). Electronically Signed   By: Darrin Nipper M.D.   On: 05/31/2022 18:10   DG Chest Port 1 View  Result Date: 05/31/2022 CLINICAL DATA:  Shortness of breath EXAM: PORTABLE CHEST 1 VIEW COMPARISON:  Chest radiograph dated 02/27/2022. FINDINGS: The heart size and mediastinal contours are within normal limits. Vascular calcifications are seen in the aortic arch. Mild scarring/atelectasis is seen in the mid and lower lungs bilaterally. No pleural effusion or pneumothorax. Degenerative changes are seen in the spine. IMPRESSION: Mild scarring/atelectasis in the mid and lower lungs bilaterally. Electronically Signed   By: Zerita Boers M.D.   On: 05/31/2022 15:27   PROCEDURES: Critical Care performed: No .1-3 Lead EKG Interpretation  Performed by: Naaman Plummer, MD Authorized by: Naaman Plummer, MD     Interpretation: normal     ECG rate:  71   ECG rate assessment: normal     Rhythm: sinus rhythm     Ectopy: none     Conduction: normal    MEDICATIONS ORDERED IN ED: Medications  butalbital-acetaminophen-caffeine (FIORICET) 50-325-40 MG per tablet 2 tablet (has no administration in time range)  dextrose 50 % solution 50 mL (50 mLs Intravenous Given 05/31/22 1421)  dextrose 50 % solution 12.5 g (12.5 g Intravenous Not Given 05/31/22 1521)  sodium chloride 0.9 % bolus 1,000 mL (1,000 mLs Intravenous New Bag/Given 05/31/22 1725)  iohexol (OMNIPAQUE) 350 MG/ML injection 100 mL (100 mLs Intravenous Contrast Given 05/31/22 1744)   IMPRESSION / MDM / ASSESSMENT AND PLAN / ED COURSE  I reviewed the triage vital signs and the nursing notes.                             The patient is on the cardiac  monitor to evaluate for evidence of arrhythmia and/or significant heart rate changes. Patient's presentation is most consistent with acute presentation with potential threat to life or bodily function. The patient is suffering from shortness of breath, but the immediate cause is not apparent.  Potential causes considered include, but are not limited to, asthma or COPD, congestive heart failure, pulmonary embolism, pneumothorax, coronary syndrome, pneumonia, and pleural effusion.  Despite the evaluation including history, exam, and testing, the cause of the shortness of breath remains unclear. However, during the ED stay, patients condition improved, and at the time of discharge the shortness of breath is resolved, they are feeling well, and want to go home.  Patient will be discharged with strict return precautions and advice to follow up with primary MD within 24 hours for further evaluation.   FINAL CLINICAL IMPRESSION(S) / ED DIAGNOSES   Final diagnoses:  Shortness of breath  Hypoglycemia   Rx / DC Orders   ED Discharge Orders     None      Note:  This document was prepared using Dragon voice recognition software and may include unintentional dictation errors.   Naaman Plummer, MD 05/31/22 [phone]

## 2022-05-31 NOTE — ED Notes (Signed)
Pt's clothes taken off and double bagged. Pt cleaned. Clean linens provided. Male purewick in place.

## 2022-05-31 NOTE — ED Notes (Signed)
CBG 41. MD made aware. Dextrose and food/orange juice given. Pt A&Ox4.

## 2022-05-31 NOTE — ED Triage Notes (Signed)
Pt arrives from home via [facility], with the complaint of altered mental status. Pt lives at home with his son. Pt states that when he woke up, he couldn't breathe.

## 2022-06-01 DIAGNOSIS — Z9181 History of falling: Secondary | ICD-10-CM | POA: Diagnosis not present

## 2022-06-01 DIAGNOSIS — Z87891 Personal history of nicotine dependence: Secondary | ICD-10-CM | POA: Diagnosis not present

## 2022-06-01 DIAGNOSIS — E1165 Type 2 diabetes mellitus with hyperglycemia: Secondary | ICD-10-CM | POA: Diagnosis not present

## 2022-06-01 DIAGNOSIS — J432 Centrilobular emphysema: Secondary | ICD-10-CM | POA: Diagnosis not present

## 2022-06-01 DIAGNOSIS — Z7902 Long term (current) use of antithrombotics/antiplatelets: Secondary | ICD-10-CM | POA: Diagnosis not present

## 2022-06-01 DIAGNOSIS — S31109D Unspecified open wound of abdominal wall, unspecified quadrant without penetration into peritoneal cavity, subsequent encounter: Secondary | ICD-10-CM | POA: Diagnosis not present

## 2022-06-01 DIAGNOSIS — I10 Essential (primary) hypertension: Secondary | ICD-10-CM | POA: Diagnosis not present

## 2022-06-01 DIAGNOSIS — Z8673 Personal history of transient ischemic attack (TIA), and cerebral infarction without residual deficits: Secondary | ICD-10-CM | POA: Diagnosis not present

## 2022-06-01 DIAGNOSIS — Z89512 Acquired absence of left leg below knee: Secondary | ICD-10-CM | POA: Diagnosis not present

## 2022-06-01 DIAGNOSIS — Z7982 Long term (current) use of aspirin: Secondary | ICD-10-CM | POA: Diagnosis not present

## 2022-06-04 ENCOUNTER — Emergency Department: Payer: Medicare Other

## 2022-06-04 ENCOUNTER — Other Ambulatory Visit: Payer: Self-pay

## 2022-06-04 ENCOUNTER — Encounter (INDEPENDENT_AMBULATORY_CARE_PROVIDER_SITE_OTHER): Payer: Self-pay

## 2022-06-04 ENCOUNTER — Telehealth: Payer: Self-pay

## 2022-06-04 DIAGNOSIS — I509 Heart failure, unspecified: Secondary | ICD-10-CM | POA: Insufficient documentation

## 2022-06-04 DIAGNOSIS — Z8673 Personal history of transient ischemic attack (TIA), and cerebral infarction without residual deficits: Secondary | ICD-10-CM | POA: Diagnosis not present

## 2022-06-04 DIAGNOSIS — I110 Hypertensive heart disease with heart failure: Secondary | ICD-10-CM | POA: Insufficient documentation

## 2022-06-04 DIAGNOSIS — Z743 Need for continuous supervision: Secondary | ICD-10-CM | POA: Diagnosis not present

## 2022-06-04 DIAGNOSIS — J439 Emphysema, unspecified: Secondary | ICD-10-CM | POA: Diagnosis not present

## 2022-06-04 DIAGNOSIS — R220 Localized swelling, mass and lump, head: Secondary | ICD-10-CM | POA: Diagnosis not present

## 2022-06-04 DIAGNOSIS — R2233 Localized swelling, mass and lump, upper limb, bilateral: Secondary | ICD-10-CM | POA: Insufficient documentation

## 2022-06-04 DIAGNOSIS — Z7902 Long term (current) use of antithrombotics/antiplatelets: Secondary | ICD-10-CM | POA: Diagnosis not present

## 2022-06-04 DIAGNOSIS — R911 Solitary pulmonary nodule: Secondary | ICD-10-CM | POA: Diagnosis not present

## 2022-06-04 DIAGNOSIS — R600 Localized edema: Secondary | ICD-10-CM | POA: Diagnosis not present

## 2022-06-04 DIAGNOSIS — J432 Centrilobular emphysema: Secondary | ICD-10-CM | POA: Diagnosis not present

## 2022-06-04 DIAGNOSIS — R739 Hyperglycemia, unspecified: Secondary | ICD-10-CM | POA: Diagnosis not present

## 2022-06-04 DIAGNOSIS — R609 Edema, unspecified: Secondary | ICD-10-CM | POA: Diagnosis not present

## 2022-06-04 DIAGNOSIS — Z7982 Long term (current) use of aspirin: Secondary | ICD-10-CM | POA: Diagnosis not present

## 2022-06-04 DIAGNOSIS — Z9181 History of falling: Secondary | ICD-10-CM | POA: Diagnosis not present

## 2022-06-04 DIAGNOSIS — E1165 Type 2 diabetes mellitus with hyperglycemia: Secondary | ICD-10-CM | POA: Diagnosis not present

## 2022-06-04 DIAGNOSIS — S31109D Unspecified open wound of abdominal wall, unspecified quadrant without penetration into peritoneal cavity, subsequent encounter: Secondary | ICD-10-CM | POA: Diagnosis not present

## 2022-06-04 DIAGNOSIS — Z87891 Personal history of nicotine dependence: Secondary | ICD-10-CM | POA: Diagnosis not present

## 2022-06-04 DIAGNOSIS — I10 Essential (primary) hypertension: Secondary | ICD-10-CM | POA: Diagnosis not present

## 2022-06-04 DIAGNOSIS — Z89512 Acquired absence of left leg below knee: Secondary | ICD-10-CM | POA: Diagnosis not present

## 2022-06-04 LAB — URINALYSIS, ROUTINE W REFLEX MICROSCOPIC
Bacteria, UA: NONE SEEN
Bilirubin Urine: NEGATIVE
Glucose, UA: >=500 mg/dL — AB
Hgb urine dipstick: NEGATIVE
Ketones, ur: NEGATIVE mg/dL
Leukocytes,Ua: NEGATIVE
Nitrite: NEGATIVE
Protein, ur: NEGATIVE mg/dL
Specific Gravity, Urine: 1.026 (ref 1.005–1.030)
WBC, UA: NONE SEEN WBC/hpf (ref 0–5)
pH: 5 (ref 5.0–8.0)

## 2022-06-04 LAB — CBC WITH DIFFERENTIAL/PLATELET
Abs Immature Granulocytes: 0.09 10*3/uL — ABNORMAL HIGH (ref 0.00–0.07)
Basophils Absolute: 0.1 10*3/uL (ref 0.0–0.1)
Basophils Relative: 1 %
Eosinophils Absolute: 0.4 10*3/uL (ref 0.0–0.5)
Eosinophils Relative: 5 %
HCT: 46.2 % (ref 39.0–52.0)
Hemoglobin: 15.2 g/dL (ref 13.0–17.0)
Immature Granulocytes: 1 %
Lymphocytes Relative: 29 %
Lymphs Abs: 2.4 10*3/uL (ref 0.7–4.0)
MCH: 31 pg (ref 26.0–34.0)
MCHC: 32.9 g/dL (ref 30.0–36.0)
MCV: 94.3 fL (ref 80.0–100.0)
Monocytes Absolute: 0.8 10*3/uL (ref 0.1–1.0)
Monocytes Relative: 10 %
Neutro Abs: 4.5 10*3/uL (ref 1.7–7.7)
Neutrophils Relative %: 54 %
Platelets: 244 10*3/uL (ref 150–400)
RBC: 4.9 MIL/uL (ref 4.22–5.81)
RDW: 13.8 % (ref 11.5–15.5)
WBC: 8.3 10*3/uL (ref 4.0–10.5)
nRBC: 0 % (ref 0.0–0.2)

## 2022-06-04 LAB — COMPREHENSIVE METABOLIC PANEL
ALT: 33 U/L (ref 0–44)
AST: 21 U/L (ref 15–41)
Albumin: 4 g/dL (ref 3.5–5.0)
Alkaline Phosphatase: 82 U/L (ref 38–126)
Anion gap: 8 (ref 5–15)
BUN: 53 mg/dL — ABNORMAL HIGH (ref 8–23)
CO2: 22 mmol/L (ref 22–32)
Calcium: 8.9 mg/dL (ref 8.9–10.3)
Chloride: 106 mmol/L (ref 98–111)
Creatinine, Ser: 1.28 mg/dL — ABNORMAL HIGH (ref 0.61–1.24)
GFR, Estimated: 60 mL/min — ABNORMAL LOW (ref >60)
Glucose, Bld: 232 mg/dL — ABNORMAL HIGH (ref 70–99)
Potassium: 4.4 mmol/L (ref 3.5–5.1)
Sodium: 136 mmol/L (ref 135–145)
Total Bilirubin: 0.6 mg/dL (ref 0.3–1.2)
Total Protein: 7.1 g/dL (ref 6.5–8.1)

## 2022-06-04 LAB — TROPONIN I (HIGH SENSITIVITY): Troponin I (High Sensitivity): 8 ng/L (ref <18)

## 2022-06-04 LAB — BRAIN NATRIURETIC PEPTIDE: B Natriuretic Peptide: 53.5 pg/mL (ref 0.0–100.0)

## 2022-06-04 MED ORDER — ACETAMINOPHEN 500 MG PO TABS
1000.0000 mg | ORAL_TABLET | Freq: Once | ORAL | Status: AC
  Administered 2022-06-04: 1000 mg via ORAL
  Filled 2022-06-04: qty 2

## 2022-06-04 NOTE — Telephone Encounter (Signed)
Danny [name] called back from Access Nurse to state she spoke with Danny [name] from [facility] and she was not with the patient.  I spoke with Danny [name], CMA, to let her know.  Danny states she just spoke with Danny [name] from [facility] and Danny states she is getting ready to call 911 to go see patient.  I relayed this message to Danny [name] from Access Nurse. 

## 2022-06-04 NOTE — Telephone Encounter (Signed)
Benjamine Mola called back from Access Nurse to state she spoke with Alden Benjamin from [facility] and she was not with the patient.  I spoke with Fulton Mole, CMA, to let her know.  Gae Bon states she just spoke with [place] from [place] and Gae Bon states she is getting ready to call 911 to go see patient.  I relayed this message to [facility] from Print production planner.

## 2022-06-04 NOTE — ED Provider Triage Note (Signed)
Emergency Medicine Provider Triage Evaluation Note  Danny Estimable Stanfield Sr. , a 71 y.o. male  was evaluated in triage.  Pt complains of increased peripheral edema.  Patient has reported edema of the bilateral upper extremities, reported edema in the face.  No chest pain or shortness of breath.  Patient only has 1 leg, states no peripheral edema in the leg.  Is unable to weigh himself.  History of CHF.[name]  Review of Systems  Positive: Bilateral lower extremity edema, facial edema Negative: Chest pain, shortness of breath  Physical Exam  There were no vitals taken for this visit. Gen:   Awake, no distress   Resp:  Normal effort MSK:   Moves extremities without difficulty.  No gross edema identified to the upper extremities Other:    Medical Decision Making  Medically screening exam initiated at 7:24 PM.  Appropriate orders placed.  Danny Estimable Biederman Sr. was informed that the remainder of the evaluation will be completed by another provider, this initial triage assessment does not replace that evaluation, and the importance of remaining in the ED until their evaluation is complete.  Patient presents with bilateral peripheral edema in the upper extremities and facial edema.  No gross edema identified on exam.  Patient will have labs, x-ray, EKG   Darletta Moll, PA-C 06/04/22 1927

## 2022-06-04 NOTE — Telephone Encounter (Signed)
Please call patient and advise him that he get looked at today for this.

## 2022-06-04 NOTE — ED Triage Notes (Signed)
Patient arrived by EMS from home with c/o bilateral hand swelling. Reports new medication started a week ago. Home health nurse reported minimal facial swelling. EMS denies facial swelling  History diabetes   EMS vitals: 153/87 b/p 270CBG

## 2022-06-04 NOTE — Telephone Encounter (Signed)
Alden Benjamin is calling from [facility] to state she is with the patient right now.  Alden Benjamin states patient on [place] about a month ago.  Patient is experiencing swelling in his face, abdomen, and hands.  Alden Benjamin states patient looks like if you put a pin in him, he would pop.  Alden Benjamin states patient looks like he gained weight overnight.  Alden Benjamin states his belly is huge.  Alden Benjamin states patient does not seem to be in any type of respiratory distress.  Alden Benjamin states patient is not complaining of pain or shortness of breath.  Alden Benjamin states patient had the shot this morning and when she got there about 3:25pm, patient was in the bed and said he just was not feeling good.  Alden Benjamin states this is normal on the day he takes his shot.  Alden Benjamin states patient is home alone as his caregiver has moved out.  Alden Benjamin states she is going to reach out to [facility] [employer].  Alden Benjamin states patient went to the ED on 05/31/2022 for shortness of breath.  I transferred call to Access Nurse.

## 2022-06-04 NOTE — ED Notes (Signed)
Urine sent

## 2022-06-04 NOTE — Telephone Encounter (Signed)
I called the patient and informed him that the provider wants him to go to the hospital about his swelling, his son was not home so I called 911 and had them to go and get the patient and take him to the hospital.  Melinna Linarez,cma

## 2022-06-05 ENCOUNTER — Emergency Department
Admission: EM | Admit: 2022-06-05 | Discharge: 2022-06-05 | Disposition: A | Discharge status: Home / Self Care | Payer: Medicare Other | Attending: Emergency Medicine | Admitting: Emergency Medicine

## 2022-06-05 DIAGNOSIS — R609 Edema, unspecified: Secondary | ICD-10-CM

## 2022-06-05 DIAGNOSIS — Z743 Need for continuous supervision: Secondary | ICD-10-CM | POA: Diagnosis not present

## 2022-06-05 DIAGNOSIS — R6889 Other general symptoms and signs: Secondary | ICD-10-CM | POA: Diagnosis not present

## 2022-06-05 DIAGNOSIS — R2233 Localized swelling, mass and lump, upper limb, bilateral: Secondary | ICD-10-CM | POA: Diagnosis not present

## 2022-06-05 DIAGNOSIS — Z7401 Bed confinement status: Secondary | ICD-10-CM | POA: Diagnosis not present

## 2022-06-05 LAB — TROPONIN I (HIGH SENSITIVITY): Troponin I (High Sensitivity): 8 ng/L (ref <18)

## 2022-06-05 MED ORDER — OXYCODONE-ACETAMINOPHEN 5-325 MG PO TABS
1.0000 | ORAL_TABLET | ORAL | Status: AC | PRN
  Administered 2022-06-05 (×2): 1 via ORAL
  Filled 2022-06-05 (×2): qty 1

## 2022-06-05 NOTE — ED Notes (Signed)
[place]

## 2022-06-05 NOTE — ED Notes (Signed)
Pt assisted into sitting position, denies further needs. WCTM.

## 2022-06-05 NOTE — ED Provider Notes (Signed)
[facility] Provider Note    Event Date/Time   First MD Initiated Contact with Patient 06/05/22 0502     (approximate)   History   Hand Swelling   HPI  Danny MCCONAGHY Sr. is a 71 y.o. male with extensive past medical history including CHF.  He presents for evaluation of bilateral hand swelling.  He said he was not too worried about it but his home health nurse wanted him to be seen.  She also thought he had a little bit of facial swelling.  He denies any other symptoms including shortness of breath or chest pain.  He said he has no pain in his hands.  He has not fallen recently and is able to use his hands just fine.  He said he is mostly just hungry.  He said that the swelling seems to have gone down since coming to the emergency department many hours ago (he had a very long wait due to overwhelming ED and hospital patient volume and overcrowding/boarding).  He has no specific complaints or concerns at this time.  He said he has been taking his medications at home.     Physical Exam   Triage Vital Signs: ED Triage Vitals  Enc Vitals Group     BP 06/04/22 1925 100/75     Pulse Rate 06/04/22 1925 90     Resp 06/04/22 1925 20     Temp 06/04/22 1925 98.4 F (36.9 C)     Temp Source 06/05/22 0107 Oral     SpO2 06/04/22 1925 95 %     Weight --      Height 06/04/22 1926 1.829 m (6')     Head Circumference --      Peak Flow --      Pain Score 06/05/22 0347 8     Pain Loc --      Pain Edu? --      Excl. in GC? --     Most recent vital signs: Vitals:   06/05/22 0350 06/05/22 0509  BP: (!) 154/100 (!) 150/92  Pulse: 80 81  Resp: 17 18  Temp: 97.7 F (36.5 C) 98 F (36.7 C)  SpO2: 97% 98%     General: Awake, no distress.  Slightly disheveled but seems to be at his baseline. CV:  Good peripheral perfusion.  Regular and rhythm.  Normal heart sounds. Resp:  Normal effort.  Lungs are clear to auscultation. Abd:  No distention.  No tenderness to  palpation. Other:  Patient has well-healed left below-knee amputation with no evidence of infection.  He has no peripheral edema and the remainder of his left leg nor throughout his right leg.  He has a slightly larger left hand than right, but it is a very minimal difference with no significant edema visible at this time.  He has easily palpable radial pulses bilaterally.  He has no pain or tenderness.  He is able to exert normal grip strength and upper arm strength on both sides.  There is no evidence of infection in either hand.   ED Results / Procedures / Treatments   Labs (all labs ordered are listed, but only abnormal results are displayed) Labs Reviewed  COMPREHENSIVE METABOLIC PANEL - Abnormal; Notable for the following components:      Result Value   Glucose, Bld 232 (*)    BUN 53 (*)    Creatinine, Ser 1.28 (*)    GFR, Estimated 60 (*)    All  other components within normal limits  CBC WITH DIFFERENTIAL/PLATELET - Abnormal; Notable for the following components:   Abs Immature Granulocytes 0.09 (*)    All other components within normal limits  URINALYSIS, ROUTINE W REFLEX MICROSCOPIC - Abnormal; Notable for the following components:   Color, Urine YELLOW (*)    APPearance CLEAR (*)    Glucose, UA >=500 (*)    All other components within normal limits  BRAIN NATRIURETIC PEPTIDE  TROPONIN I (HIGH SENSITIVITY)  TROPONIN I (HIGH SENSITIVITY)     EKG  ED ECG REPORT I, Hinda Kehr, the attending physician, personally viewed and interpreted this ECG.  Date: 06/04/2022 EKG Time: 19: 30 Rate: 81 Rhythm: normal sinus rhythm QRS Axis: normal Intervals: normal ST/T Wave abnormalities: Non-specific ST segment / T-wave changes, but no clear evidence of acute ischemia. Narrative Interpretation: no definitive evidence of acute ischemia; does not meet STEMI criteria.    RADIOLOGY I viewed and interpreted the patient's two-view chest x-ray.  There is no evidence of pulmonary  edema.  I also read the radiologist's report, which confirmed no acute findings.    PROCEDURES:  Critical Care performed: No  Procedures   MEDICATIONS ORDERED IN ED: Medications  oxyCODONE-acetaminophen (PERCOCET/ROXICET) 5-325 MG per tablet 1 tablet (1 tablet Oral Given 06/05/22 0349)  acetaminophen (TYLENOL) tablet 1,000 mg (1,000 mg Oral Given 06/04/22 2104)     IMPRESSION / MDM / ASSESSMENT AND PLAN / ED COURSE  I reviewed the triage vital signs and the nursing notes.                              Differential diagnosis includes, but is not limited to, peripheral/dependent edema, cellulitis, vascular disturbance.  Patient's presentation is most consistent with acute presentation with potential threat to life or bodily function.  Labs/studies ordered: EKG, two-view chest x-ray, CBC with differential, urinalysis, CMP, BNP, high-sensitivity troponin x2.  Fortunately patient's evaluation has been reassuring.  He has a little bit of hypertension but that is likely chronic and also most likely has to do with being in the emergency department for an extended period of time without his medications.  His labs are all essentially normal.  He has very slight elevation of his creatinine but it is similar and within range of his values in the past and his GFR is still estimated to be 60.  He has a normal BNP and no evidence of pulmonary edema on his chest x-ray.  His physical exam is reassuring with no obvious swelling or indication of infection or injury.  I suspect he had dependent edema that went away during his long wait.  There is no indication for emergent intervention.  No indication of any acute or emergent medical condition.  He can follow-up as an outpatient.  I provided reassurance and the patient was pleased that he is able to go home.  He he is tolerating oral intake in the ED without difficulty.  I given usual and customary follow-up recommendations and return precautions.        FINAL CLINICAL IMPRESSION(S) / ED DIAGNOSES   Final diagnoses:  Dependent edema     Rx / DC Orders   ED Discharge Orders     None        Note:  This document was prepared using Dragon voice recognition software and may include unintentional dictation errors.   Hinda Kehr, MD 06/05/22 [phone]

## 2022-06-05 NOTE — Discharge Instructions (Addendum)
You were thoroughly evaluated in the emergency department tonight and there is no indication that you have an acute or emergent medical condition.  The swelling in your hands apparently went down prior to being seen and you most likely have a degree of peripheral or dependent edema.  You are not having heart failure exacerbation.  We recommend you continue taking your regular medications and follow-up with your regular doctor at the next available opportunity.    Return to the emergency department if you develop new or worsening symptoms that concern you.

## 2022-06-06 DIAGNOSIS — S31109D Unspecified open wound of abdominal wall, unspecified quadrant without penetration into peritoneal cavity, subsequent encounter: Secondary | ICD-10-CM | POA: Diagnosis not present

## 2022-06-06 DIAGNOSIS — I10 Essential (primary) hypertension: Secondary | ICD-10-CM | POA: Diagnosis not present

## 2022-06-06 DIAGNOSIS — Z89512 Acquired absence of left leg below knee: Secondary | ICD-10-CM | POA: Diagnosis not present

## 2022-06-06 DIAGNOSIS — Z8673 Personal history of transient ischemic attack (TIA), and cerebral infarction without residual deficits: Secondary | ICD-10-CM | POA: Diagnosis not present

## 2022-06-06 DIAGNOSIS — Z7902 Long term (current) use of antithrombotics/antiplatelets: Secondary | ICD-10-CM | POA: Diagnosis not present

## 2022-06-06 DIAGNOSIS — Z9181 History of falling: Secondary | ICD-10-CM | POA: Diagnosis not present

## 2022-06-06 DIAGNOSIS — Z87891 Personal history of nicotine dependence: Secondary | ICD-10-CM | POA: Diagnosis not present

## 2022-06-06 DIAGNOSIS — E1165 Type 2 diabetes mellitus with hyperglycemia: Secondary | ICD-10-CM | POA: Diagnosis not present

## 2022-06-06 DIAGNOSIS — J432 Centrilobular emphysema: Secondary | ICD-10-CM | POA: Diagnosis not present

## 2022-06-06 DIAGNOSIS — Z7982 Long term (current) use of aspirin: Secondary | ICD-10-CM | POA: Diagnosis not present

## 2022-06-07 ENCOUNTER — Other Ambulatory Visit: Payer: Self-pay | Admitting: Family Medicine

## 2022-06-07 DIAGNOSIS — E1169 Type 2 diabetes mellitus with other specified complication: Secondary | ICD-10-CM

## 2022-06-08 DIAGNOSIS — Z89512 Acquired absence of left leg below knee: Secondary | ICD-10-CM | POA: Diagnosis not present

## 2022-06-08 DIAGNOSIS — Z7902 Long term (current) use of antithrombotics/antiplatelets: Secondary | ICD-10-CM | POA: Diagnosis not present

## 2022-06-08 DIAGNOSIS — E1165 Type 2 diabetes mellitus with hyperglycemia: Secondary | ICD-10-CM | POA: Diagnosis not present

## 2022-06-08 DIAGNOSIS — I10 Essential (primary) hypertension: Secondary | ICD-10-CM | POA: Diagnosis not present

## 2022-06-08 DIAGNOSIS — Z9181 History of falling: Secondary | ICD-10-CM | POA: Diagnosis not present

## 2022-06-08 DIAGNOSIS — Z87891 Personal history of nicotine dependence: Secondary | ICD-10-CM | POA: Diagnosis not present

## 2022-06-08 DIAGNOSIS — J432 Centrilobular emphysema: Secondary | ICD-10-CM | POA: Diagnosis not present

## 2022-06-08 DIAGNOSIS — Z8673 Personal history of transient ischemic attack (TIA), and cerebral infarction without residual deficits: Secondary | ICD-10-CM | POA: Diagnosis not present

## 2022-06-08 DIAGNOSIS — S31109D Unspecified open wound of abdominal wall, unspecified quadrant without penetration into peritoneal cavity, subsequent encounter: Secondary | ICD-10-CM | POA: Diagnosis not present

## 2022-06-08 DIAGNOSIS — Z7982 Long term (current) use of aspirin: Secondary | ICD-10-CM | POA: Diagnosis not present

## 2022-06-11 DIAGNOSIS — Z9181 History of falling: Secondary | ICD-10-CM | POA: Diagnosis not present

## 2022-06-11 DIAGNOSIS — Z87891 Personal history of nicotine dependence: Secondary | ICD-10-CM | POA: Diagnosis not present

## 2022-06-11 DIAGNOSIS — S31109D Unspecified open wound of abdominal wall, unspecified quadrant without penetration into peritoneal cavity, subsequent encounter: Secondary | ICD-10-CM | POA: Diagnosis not present

## 2022-06-11 DIAGNOSIS — Z7902 Long term (current) use of antithrombotics/antiplatelets: Secondary | ICD-10-CM | POA: Diagnosis not present

## 2022-06-11 DIAGNOSIS — E1165 Type 2 diabetes mellitus with hyperglycemia: Secondary | ICD-10-CM | POA: Diagnosis not present

## 2022-06-11 DIAGNOSIS — J432 Centrilobular emphysema: Secondary | ICD-10-CM | POA: Diagnosis not present

## 2022-06-11 DIAGNOSIS — Z8673 Personal history of transient ischemic attack (TIA), and cerebral infarction without residual deficits: Secondary | ICD-10-CM | POA: Diagnosis not present

## 2022-06-11 DIAGNOSIS — Z7982 Long term (current) use of aspirin: Secondary | ICD-10-CM | POA: Diagnosis not present

## 2022-06-11 DIAGNOSIS — I10 Essential (primary) hypertension: Secondary | ICD-10-CM | POA: Diagnosis not present

## 2022-06-11 DIAGNOSIS — Z89512 Acquired absence of left leg below knee: Secondary | ICD-10-CM | POA: Diagnosis not present

## 2022-06-12 DIAGNOSIS — Z9181 History of falling: Secondary | ICD-10-CM | POA: Diagnosis not present

## 2022-06-12 DIAGNOSIS — Z87891 Personal history of nicotine dependence: Secondary | ICD-10-CM | POA: Diagnosis not present

## 2022-06-12 DIAGNOSIS — Z89512 Acquired absence of left leg below knee: Secondary | ICD-10-CM | POA: Diagnosis not present

## 2022-06-12 DIAGNOSIS — Z8673 Personal history of transient ischemic attack (TIA), and cerebral infarction without residual deficits: Secondary | ICD-10-CM | POA: Diagnosis not present

## 2022-06-12 DIAGNOSIS — Z7902 Long term (current) use of antithrombotics/antiplatelets: Secondary | ICD-10-CM | POA: Diagnosis not present

## 2022-06-12 DIAGNOSIS — I10 Essential (primary) hypertension: Secondary | ICD-10-CM | POA: Diagnosis not present

## 2022-06-12 DIAGNOSIS — J432 Centrilobular emphysema: Secondary | ICD-10-CM | POA: Diagnosis not present

## 2022-06-12 DIAGNOSIS — Z7982 Long term (current) use of aspirin: Secondary | ICD-10-CM | POA: Diagnosis not present

## 2022-06-12 DIAGNOSIS — S31109D Unspecified open wound of abdominal wall, unspecified quadrant without penetration into peritoneal cavity, subsequent encounter: Secondary | ICD-10-CM | POA: Diagnosis not present

## 2022-06-12 DIAGNOSIS — E1165 Type 2 diabetes mellitus with hyperglycemia: Secondary | ICD-10-CM | POA: Diagnosis not present

## 2022-06-13 DIAGNOSIS — Z8673 Personal history of transient ischemic attack (TIA), and cerebral infarction without residual deficits: Secondary | ICD-10-CM | POA: Diagnosis not present

## 2022-06-13 DIAGNOSIS — E1165 Type 2 diabetes mellitus with hyperglycemia: Secondary | ICD-10-CM | POA: Diagnosis not present

## 2022-06-13 DIAGNOSIS — Z87891 Personal history of nicotine dependence: Secondary | ICD-10-CM | POA: Diagnosis not present

## 2022-06-13 DIAGNOSIS — J432 Centrilobular emphysema: Secondary | ICD-10-CM | POA: Diagnosis not present

## 2022-06-13 DIAGNOSIS — Z9181 History of falling: Secondary | ICD-10-CM | POA: Diagnosis not present

## 2022-06-13 DIAGNOSIS — Z7902 Long term (current) use of antithrombotics/antiplatelets: Secondary | ICD-10-CM | POA: Diagnosis not present

## 2022-06-13 DIAGNOSIS — Z7982 Long term (current) use of aspirin: Secondary | ICD-10-CM | POA: Diagnosis not present

## 2022-06-13 DIAGNOSIS — Z89512 Acquired absence of left leg below knee: Secondary | ICD-10-CM | POA: Diagnosis not present

## 2022-06-13 DIAGNOSIS — I10 Essential (primary) hypertension: Secondary | ICD-10-CM | POA: Diagnosis not present

## 2022-06-13 DIAGNOSIS — S31109D Unspecified open wound of abdominal wall, unspecified quadrant without penetration into peritoneal cavity, subsequent encounter: Secondary | ICD-10-CM | POA: Diagnosis not present

## 2022-06-14 DIAGNOSIS — Z89512 Acquired absence of left leg below knee: Secondary | ICD-10-CM | POA: Diagnosis not present

## 2022-06-14 DIAGNOSIS — I10 Essential (primary) hypertension: Secondary | ICD-10-CM | POA: Diagnosis not present

## 2022-06-14 DIAGNOSIS — Z9181 History of falling: Secondary | ICD-10-CM | POA: Diagnosis not present

## 2022-06-14 DIAGNOSIS — Z7902 Long term (current) use of antithrombotics/antiplatelets: Secondary | ICD-10-CM | POA: Diagnosis not present

## 2022-06-14 DIAGNOSIS — Z8673 Personal history of transient ischemic attack (TIA), and cerebral infarction without residual deficits: Secondary | ICD-10-CM | POA: Diagnosis not present

## 2022-06-14 DIAGNOSIS — Z7982 Long term (current) use of aspirin: Secondary | ICD-10-CM | POA: Diagnosis not present

## 2022-06-14 DIAGNOSIS — E1165 Type 2 diabetes mellitus with hyperglycemia: Secondary | ICD-10-CM | POA: Diagnosis not present

## 2022-06-14 DIAGNOSIS — S31109D Unspecified open wound of abdominal wall, unspecified quadrant without penetration into peritoneal cavity, subsequent encounter: Secondary | ICD-10-CM | POA: Diagnosis not present

## 2022-06-14 DIAGNOSIS — Z87891 Personal history of nicotine dependence: Secondary | ICD-10-CM | POA: Diagnosis not present

## 2022-06-14 DIAGNOSIS — J432 Centrilobular emphysema: Secondary | ICD-10-CM | POA: Diagnosis not present

## 2022-06-17 DIAGNOSIS — R531 Weakness: Secondary | ICD-10-CM | POA: Diagnosis not present

## 2022-06-17 DIAGNOSIS — J449 Chronic obstructive pulmonary disease, unspecified: Secondary | ICD-10-CM | POA: Diagnosis not present

## 2022-06-18 DIAGNOSIS — Z8673 Personal history of transient ischemic attack (TIA), and cerebral infarction without residual deficits: Secondary | ICD-10-CM | POA: Diagnosis not present

## 2022-06-18 DIAGNOSIS — I10 Essential (primary) hypertension: Secondary | ICD-10-CM | POA: Diagnosis not present

## 2022-06-18 DIAGNOSIS — Z7902 Long term (current) use of antithrombotics/antiplatelets: Secondary | ICD-10-CM | POA: Diagnosis not present

## 2022-06-18 DIAGNOSIS — Z7982 Long term (current) use of aspirin: Secondary | ICD-10-CM | POA: Diagnosis not present

## 2022-06-18 DIAGNOSIS — Z89512 Acquired absence of left leg below knee: Secondary | ICD-10-CM | POA: Diagnosis not present

## 2022-06-18 DIAGNOSIS — Z9181 History of falling: Secondary | ICD-10-CM | POA: Diagnosis not present

## 2022-06-18 DIAGNOSIS — J432 Centrilobular emphysema: Secondary | ICD-10-CM | POA: Diagnosis not present

## 2022-06-18 DIAGNOSIS — E1165 Type 2 diabetes mellitus with hyperglycemia: Secondary | ICD-10-CM | POA: Diagnosis not present

## 2022-06-18 DIAGNOSIS — Z87891 Personal history of nicotine dependence: Secondary | ICD-10-CM | POA: Diagnosis not present

## 2022-06-18 DIAGNOSIS — S31109D Unspecified open wound of abdominal wall, unspecified quadrant without penetration into peritoneal cavity, subsequent encounter: Secondary | ICD-10-CM | POA: Diagnosis not present

## 2022-06-19 DIAGNOSIS — Z89512 Acquired absence of left leg below knee: Secondary | ICD-10-CM | POA: Diagnosis not present

## 2022-06-19 DIAGNOSIS — S31109D Unspecified open wound of abdominal wall, unspecified quadrant without penetration into peritoneal cavity, subsequent encounter: Secondary | ICD-10-CM | POA: Diagnosis not present

## 2022-06-19 DIAGNOSIS — Z9181 History of falling: Secondary | ICD-10-CM | POA: Diagnosis not present

## 2022-06-19 DIAGNOSIS — Z87891 Personal history of nicotine dependence: Secondary | ICD-10-CM | POA: Diagnosis not present

## 2022-06-19 DIAGNOSIS — Z8673 Personal history of transient ischemic attack (TIA), and cerebral infarction without residual deficits: Secondary | ICD-10-CM | POA: Diagnosis not present

## 2022-06-19 DIAGNOSIS — J432 Centrilobular emphysema: Secondary | ICD-10-CM | POA: Diagnosis not present

## 2022-06-19 DIAGNOSIS — Z7982 Long term (current) use of aspirin: Secondary | ICD-10-CM | POA: Diagnosis not present

## 2022-06-19 DIAGNOSIS — I10 Essential (primary) hypertension: Secondary | ICD-10-CM | POA: Diagnosis not present

## 2022-06-19 DIAGNOSIS — Z7902 Long term (current) use of antithrombotics/antiplatelets: Secondary | ICD-10-CM | POA: Diagnosis not present

## 2022-06-19 DIAGNOSIS — E1165 Type 2 diabetes mellitus with hyperglycemia: Secondary | ICD-10-CM | POA: Diagnosis not present

## 2022-06-21 DIAGNOSIS — Z9181 History of falling: Secondary | ICD-10-CM | POA: Diagnosis not present

## 2022-06-21 DIAGNOSIS — J432 Centrilobular emphysema: Secondary | ICD-10-CM | POA: Diagnosis not present

## 2022-06-21 DIAGNOSIS — E1165 Type 2 diabetes mellitus with hyperglycemia: Secondary | ICD-10-CM | POA: Diagnosis not present

## 2022-06-21 DIAGNOSIS — Z89512 Acquired absence of left leg below knee: Secondary | ICD-10-CM | POA: Diagnosis not present

## 2022-06-21 DIAGNOSIS — I10 Essential (primary) hypertension: Secondary | ICD-10-CM | POA: Diagnosis not present

## 2022-06-21 DIAGNOSIS — Z7902 Long term (current) use of antithrombotics/antiplatelets: Secondary | ICD-10-CM | POA: Diagnosis not present

## 2022-06-21 DIAGNOSIS — S31109D Unspecified open wound of abdominal wall, unspecified quadrant without penetration into peritoneal cavity, subsequent encounter: Secondary | ICD-10-CM | POA: Diagnosis not present

## 2022-06-21 DIAGNOSIS — Z7982 Long term (current) use of aspirin: Secondary | ICD-10-CM | POA: Diagnosis not present

## 2022-06-21 DIAGNOSIS — Z87891 Personal history of nicotine dependence: Secondary | ICD-10-CM | POA: Diagnosis not present

## 2022-06-21 DIAGNOSIS — Z8673 Personal history of transient ischemic attack (TIA), and cerebral infarction without residual deficits: Secondary | ICD-10-CM | POA: Diagnosis not present

## 2022-06-24 DIAGNOSIS — I619 Nontraumatic intracerebral hemorrhage, unspecified: Secondary | ICD-10-CM | POA: Diagnosis not present

## 2022-06-24 DIAGNOSIS — M6281 Muscle weakness (generalized): Secondary | ICD-10-CM | POA: Diagnosis not present

## 2022-06-26 DIAGNOSIS — E1165 Type 2 diabetes mellitus with hyperglycemia: Secondary | ICD-10-CM | POA: Diagnosis not present

## 2022-06-26 DIAGNOSIS — Z7902 Long term (current) use of antithrombotics/antiplatelets: Secondary | ICD-10-CM | POA: Diagnosis not present

## 2022-06-26 DIAGNOSIS — I10 Essential (primary) hypertension: Secondary | ICD-10-CM | POA: Diagnosis not present

## 2022-06-26 DIAGNOSIS — Z9181 History of falling: Secondary | ICD-10-CM | POA: Diagnosis not present

## 2022-06-26 DIAGNOSIS — Z89512 Acquired absence of left leg below knee: Secondary | ICD-10-CM | POA: Diagnosis not present

## 2022-06-26 DIAGNOSIS — Z7982 Long term (current) use of aspirin: Secondary | ICD-10-CM | POA: Diagnosis not present

## 2022-06-26 DIAGNOSIS — Z8673 Personal history of transient ischemic attack (TIA), and cerebral infarction without residual deficits: Secondary | ICD-10-CM | POA: Diagnosis not present

## 2022-06-26 DIAGNOSIS — S31109D Unspecified open wound of abdominal wall, unspecified quadrant without penetration into peritoneal cavity, subsequent encounter: Secondary | ICD-10-CM | POA: Diagnosis not present

## 2022-06-26 DIAGNOSIS — J432 Centrilobular emphysema: Secondary | ICD-10-CM | POA: Diagnosis not present

## 2022-06-26 DIAGNOSIS — Z87891 Personal history of nicotine dependence: Secondary | ICD-10-CM | POA: Diagnosis not present

## 2022-06-27 DIAGNOSIS — I10 Essential (primary) hypertension: Secondary | ICD-10-CM | POA: Diagnosis not present

## 2022-06-27 DIAGNOSIS — Z7982 Long term (current) use of aspirin: Secondary | ICD-10-CM | POA: Diagnosis not present

## 2022-06-27 DIAGNOSIS — Z7902 Long term (current) use of antithrombotics/antiplatelets: Secondary | ICD-10-CM | POA: Diagnosis not present

## 2022-06-27 DIAGNOSIS — J432 Centrilobular emphysema: Secondary | ICD-10-CM | POA: Diagnosis not present

## 2022-06-27 DIAGNOSIS — Z9181 History of falling: Secondary | ICD-10-CM | POA: Diagnosis not present

## 2022-06-27 DIAGNOSIS — Z89512 Acquired absence of left leg below knee: Secondary | ICD-10-CM | POA: Diagnosis not present

## 2022-06-27 DIAGNOSIS — E1165 Type 2 diabetes mellitus with hyperglycemia: Secondary | ICD-10-CM | POA: Diagnosis not present

## 2022-06-27 DIAGNOSIS — Z87891 Personal history of nicotine dependence: Secondary | ICD-10-CM | POA: Diagnosis not present

## 2022-06-27 DIAGNOSIS — S31109D Unspecified open wound of abdominal wall, unspecified quadrant without penetration into peritoneal cavity, subsequent encounter: Secondary | ICD-10-CM | POA: Diagnosis not present

## 2022-06-27 DIAGNOSIS — Z8673 Personal history of transient ischemic attack (TIA), and cerebral infarction without residual deficits: Secondary | ICD-10-CM | POA: Diagnosis not present

## 2022-06-30 ENCOUNTER — Observation Stay
Admission: EM | Admit: 2022-06-30 | Discharge: 2022-07-01 | Disposition: A | Discharge status: Home / Self Care | Payer: Medicare Other | Attending: Hospitalist | Admitting: Hospitalist

## 2022-06-30 ENCOUNTER — Emergency Department: Payer: Medicare Other

## 2022-06-30 ENCOUNTER — Other Ambulatory Visit: Payer: Self-pay

## 2022-06-30 ENCOUNTER — Observation Stay: Payer: Medicare Other

## 2022-06-30 DIAGNOSIS — E785 Hyperlipidemia, unspecified: Secondary | ICD-10-CM

## 2022-06-30 DIAGNOSIS — I5020 Unspecified systolic (congestive) heart failure: Secondary | ICD-10-CM | POA: Diagnosis not present

## 2022-06-30 DIAGNOSIS — R0789 Other chest pain: Principal | ICD-10-CM | POA: Insufficient documentation

## 2022-06-30 DIAGNOSIS — Z955 Presence of coronary angioplasty implant and graft: Secondary | ICD-10-CM | POA: Insufficient documentation

## 2022-06-30 DIAGNOSIS — Z87891 Personal history of nicotine dependence: Secondary | ICD-10-CM | POA: Diagnosis not present

## 2022-06-30 DIAGNOSIS — I130 Hypertensive heart and chronic kidney disease with heart failure and stage 1 through stage 4 chronic kidney disease, or unspecified chronic kidney disease: Secondary | ICD-10-CM | POA: Diagnosis not present

## 2022-06-30 DIAGNOSIS — I10 Essential (primary) hypertension: Secondary | ICD-10-CM

## 2022-06-30 DIAGNOSIS — Z7985 Long-term (current) use of injectable non-insulin antidiabetic drugs: Secondary | ICD-10-CM | POA: Insufficient documentation

## 2022-06-30 DIAGNOSIS — N183 Chronic kidney disease, stage 3 unspecified: Secondary | ICD-10-CM | POA: Diagnosis not present

## 2022-06-30 DIAGNOSIS — N182 Chronic kidney disease, stage 2 (mild): Secondary | ICD-10-CM | POA: Diagnosis not present

## 2022-06-30 DIAGNOSIS — E1169 Type 2 diabetes mellitus with other specified complication: Secondary | ICD-10-CM

## 2022-06-30 DIAGNOSIS — R079 Chest pain, unspecified: Secondary | ICD-10-CM | POA: Diagnosis not present

## 2022-06-30 DIAGNOSIS — Z7982 Long term (current) use of aspirin: Secondary | ICD-10-CM | POA: Diagnosis not present

## 2022-06-30 DIAGNOSIS — Z7902 Long term (current) use of antithrombotics/antiplatelets: Secondary | ICD-10-CM | POA: Insufficient documentation

## 2022-06-30 DIAGNOSIS — E1122 Type 2 diabetes mellitus with diabetic chronic kidney disease: Secondary | ICD-10-CM | POA: Diagnosis not present

## 2022-06-30 DIAGNOSIS — I5042 Chronic combined systolic (congestive) and diastolic (congestive) heart failure: Secondary | ICD-10-CM | POA: Diagnosis not present

## 2022-06-30 DIAGNOSIS — G4489 Other headache syndrome: Secondary | ICD-10-CM | POA: Diagnosis not present

## 2022-06-30 DIAGNOSIS — Z79899 Other long term (current) drug therapy: Secondary | ICD-10-CM | POA: Insufficient documentation

## 2022-06-30 DIAGNOSIS — R0602 Shortness of breath: Secondary | ICD-10-CM | POA: Insufficient documentation

## 2022-06-30 DIAGNOSIS — J449 Chronic obstructive pulmonary disease, unspecified: Secondary | ICD-10-CM | POA: Diagnosis not present

## 2022-06-30 DIAGNOSIS — Z1152 Encounter for screening for COVID-19: Secondary | ICD-10-CM | POA: Diagnosis not present

## 2022-06-30 DIAGNOSIS — N179 Acute kidney failure, unspecified: Secondary | ICD-10-CM | POA: Diagnosis not present

## 2022-06-30 DIAGNOSIS — Z89512 Acquired absence of left leg below knee: Secondary | ICD-10-CM | POA: Diagnosis not present

## 2022-06-30 DIAGNOSIS — I25118 Atherosclerotic heart disease of native coronary artery with other forms of angina pectoris: Secondary | ICD-10-CM | POA: Diagnosis not present

## 2022-06-30 DIAGNOSIS — Z743 Need for continuous supervision: Secondary | ICD-10-CM | POA: Diagnosis not present

## 2022-06-30 DIAGNOSIS — Z7984 Long term (current) use of oral hypoglycemic drugs: Secondary | ICD-10-CM | POA: Insufficient documentation

## 2022-06-30 DIAGNOSIS — I2510 Atherosclerotic heart disease of native coronary artery without angina pectoris: Secondary | ICD-10-CM | POA: Insufficient documentation

## 2022-06-30 DIAGNOSIS — Z794 Long term (current) use of insulin: Secondary | ICD-10-CM | POA: Insufficient documentation

## 2022-06-30 DIAGNOSIS — J4489 Other specified chronic obstructive pulmonary disease: Secondary | ICD-10-CM | POA: Diagnosis not present

## 2022-06-30 DIAGNOSIS — J439 Emphysema, unspecified: Secondary | ICD-10-CM | POA: Diagnosis not present

## 2022-06-30 DIAGNOSIS — I5032 Chronic diastolic (congestive) heart failure: Secondary | ICD-10-CM

## 2022-06-30 LAB — RESP PANEL BY RT-PCR (FLU A&B, COVID) ARPGX2
Influenza A by PCR: NEGATIVE
Influenza B by PCR: NEGATIVE
SARS Coronavirus 2 by RT PCR: NEGATIVE

## 2022-06-30 LAB — URINALYSIS, COMPLETE (UACMP) WITH MICROSCOPIC
Bilirubin Urine: NEGATIVE
Glucose, UA: >=500 mg/dL — AB
Hgb urine dipstick: NEGATIVE
Ketones, ur: NEGATIVE mg/dL
Leukocytes,Ua: NEGATIVE
Nitrite: NEGATIVE
Protein, ur: NEGATIVE mg/dL
Specific Gravity, Urine: 1.016 (ref 1.005–1.030)
pH: 5 (ref 5.0–8.0)

## 2022-06-30 LAB — TROPONIN I (HIGH SENSITIVITY)
Troponin I (High Sensitivity): 7 ng/L (ref <18)
Troponin I (High Sensitivity): 9 ng/L (ref <18)

## 2022-06-30 LAB — BRAIN NATRIURETIC PEPTIDE: B Natriuretic Peptide: 42.6 pg/mL (ref 0.0–100.0)

## 2022-06-30 LAB — CBG MONITORING, ED: Glucose-Capillary: 134 mg/dL — ABNORMAL HIGH (ref 70–99)

## 2022-06-30 LAB — CBC
HCT: 47 % (ref 39.0–52.0)
Hemoglobin: 15.2 g/dL (ref 13.0–17.0)
MCH: 30.9 pg (ref 26.0–34.0)
MCHC: 32.3 g/dL (ref 30.0–36.0)
MCV: 95.5 fL (ref 80.0–100.0)
Platelets: 234 10*3/uL (ref 150–400)
RBC: 4.92 MIL/uL (ref 4.22–5.81)
RDW: 13.3 % (ref 11.5–15.5)
WBC: 10.9 10*3/uL — ABNORMAL HIGH (ref 4.0–10.5)
nRBC: 0 % (ref 0.0–0.2)

## 2022-06-30 LAB — BASIC METABOLIC PANEL
Anion gap: 8 (ref 5–15)
BUN: 88 mg/dL — ABNORMAL HIGH (ref 8–23)
CO2: 19 mmol/L — ABNORMAL LOW (ref 22–32)
Calcium: 8.7 mg/dL — ABNORMAL LOW (ref 8.9–10.3)
Chloride: 110 mmol/L (ref 98–111)
Creatinine, Ser: 2.3 mg/dL — ABNORMAL HIGH (ref 0.61–1.24)
GFR, Estimated: 30 mL/min — ABNORMAL LOW (ref >60)
Glucose, Bld: 160 mg/dL — ABNORMAL HIGH (ref 70–99)
Potassium: 5.5 mmol/L — ABNORMAL HIGH (ref 3.5–5.1)
Sodium: 137 mmol/L (ref 135–145)

## 2022-06-30 LAB — PROTIME-INR
INR: 1.1 (ref 0.8–1.2)
Prothrombin Time: 14.5 seconds (ref 11.4–15.2)

## 2022-06-30 LAB — D-DIMER, QUANTITATIVE: D-Dimer, Quant: 0.41 ug/mL-FEU (ref 0.00–0.50)

## 2022-06-30 MED ORDER — ACETAMINOPHEN 325 MG RE SUPP: 650.0000 mg | Freq: Four times a day (QID) | RECTAL | Status: DC | PRN

## 2022-06-30 MED ORDER — SODIUM CHLORIDE 0.9 % IV BOLUS
1000.0000 mL | Freq: Once | INTRAVENOUS | Status: AC
  Administered 2022-06-30: 1000 mL via INTRAVENOUS

## 2022-06-30 MED ORDER — INSULIN ASPART 100 UNIT/ML IJ SOLN
0.0000 [IU] | Freq: Three times a day (TID) | INTRAMUSCULAR | Status: DC
  Administered 2022-07-01: 2 [IU] via SUBCUTANEOUS
  Administered 2022-07-01: 1 [IU] via SUBCUTANEOUS
  Filled 2022-06-30 (×2): qty 1

## 2022-06-30 MED ORDER — SODIUM CHLORIDE 0.9% FLUSH
3.0000 mL | Freq: Two times a day (BID) | INTRAVENOUS | Status: DC
  Administered 2022-07-01: 3 mL via INTRAVENOUS

## 2022-06-30 MED ORDER — SODIUM CHLORIDE 0.9 % IV SOLN: INTRAVENOUS | Status: AC

## 2022-06-30 MED ORDER — ACETAMINOPHEN 325 MG PO TABS
650.0000 mg | ORAL_TABLET | Freq: Four times a day (QID) | ORAL | Status: DC | PRN
  Administered 2022-07-01: 650 mg via ORAL
  Filled 2022-06-30: qty 2

## 2022-06-30 MED ORDER — CLOPIDOGREL BISULFATE 75 MG PO TABS
75.0000 mg | ORAL_TABLET | Freq: Every day | ORAL | Status: DC
  Administered 2022-07-01: 75 mg via ORAL
  Filled 2022-06-30: qty 1

## 2022-06-30 MED ORDER — ONDANSETRON HCL 4 MG/2ML IJ SOLN: 4.0000 mg | Freq: Four times a day (QID) | INTRAMUSCULAR | Status: DC | PRN

## 2022-06-30 MED ORDER — INSULIN ASPART 100 UNIT/ML IJ SOLN: 0.0000 [IU] | Freq: Every day | INTRAMUSCULAR | Status: DC

## 2022-06-30 MED ORDER — ATORVASTATIN CALCIUM 20 MG PO TABS
80.0000 mg | ORAL_TABLET | Freq: Every day | ORAL | Status: DC
  Administered 2022-07-01: 80 mg via ORAL
  Filled 2022-06-30: qty 4

## 2022-06-30 MED ORDER — ONDANSETRON HCL 4 MG PO TABS: 4.0000 mg | ORAL_TABLET | Freq: Four times a day (QID) | ORAL | Status: DC | PRN

## 2022-06-30 MED ORDER — HEPARIN SODIUM (PORCINE) 5000 UNIT/ML IJ SOLN
5000.0000 [IU] | Freq: Three times a day (TID) | INTRAMUSCULAR | Status: DC
  Administered 2022-06-30 – 2022-07-01 (×2): 5000 [IU] via SUBCUTANEOUS
  Filled 2022-06-30 (×2): qty 1

## 2022-06-30 MED ORDER — TAMSULOSIN HCL 0.4 MG PO CAPS
0.4000 mg | ORAL_CAPSULE | Freq: Every day | ORAL | Status: DC
  Administered 2022-07-01: 0.4 mg via ORAL
  Filled 2022-06-30: qty 1

## 2022-06-30 MED ORDER — IPRATROPIUM-ALBUTEROL 0.5-2.5 (3) MG/3ML IN SOLN
3.0000 mL | Freq: Once | RESPIRATORY_TRACT | Status: AC
  Administered 2022-06-30: 3 mL via RESPIRATORY_TRACT
  Filled 2022-06-30: qty 3

## 2022-06-30 MED ORDER — AMLODIPINE BESYLATE 5 MG PO TABS
5.0000 mg | ORAL_TABLET | Freq: Every day | ORAL | Status: DC
  Administered 2022-07-01: 5 mg via ORAL
  Filled 2022-06-30: qty 1

## 2022-06-30 MED ORDER — CARVEDILOL 25 MG PO TABS
25.0000 mg | ORAL_TABLET | Freq: Two times a day (BID) | ORAL | Status: DC
  Administered 2022-07-01: 25 mg via ORAL
  Filled 2022-06-30: qty 1

## 2022-06-30 MED ORDER — ALUM & MAG HYDROXIDE-SIMETH 200-200-20 MG/5ML PO SUSP: 15.0000 mL | ORAL | Status: DC | PRN

## 2022-06-30 MED ORDER — ASPIRIN 81 MG PO TBEC
81.0000 mg | DELAYED_RELEASE_TABLET | Freq: Every day | ORAL | Status: DC
  Administered 2022-07-01: 81 mg via ORAL
  Filled 2022-06-30: qty 1

## 2022-06-30 MED ORDER — IPRATROPIUM-ALBUTEROL 0.5-2.5 (3) MG/3ML IN SOLN: 3.0000 mL | RESPIRATORY_TRACT | Status: DC | PRN

## 2022-06-30 NOTE — ED Triage Notes (Signed)
Pt c/o SHOB when laying down and CP. Pt had ASA w/ ems, states he didn't take any nitro. Pt c/o headache at this time. Pt is AOX4, speaking in full sentences and in NAD. Pt does not use home o2.

## 2022-06-30 NOTE — ED Notes (Signed)
First Nurse Note: Pt presents via EMS from home with complaints of a headache that started tonight that radiates to his teeth.   285- CBG 97% RA EKG - unremarkable   18RFA.

## 2022-06-30 NOTE — H&P (Signed)
History and Physical    Danny MCGLINCHEY Sr. WIO:[id number] DOB: Aug 12, 1950 DOA: 06/30/2022  PCP: [name], MD   Patient coming from: Home   Chief Complaint: SOB, chest pain  HPI: Danny Scrape Sr. is a 71 y.o. male with medical history significant for hypertension, diabetes mellitus, COPD, hypertension, CAD, chronic systolic CHF, and PAD who presents to the emergency department with chest discomfort and shortness of breath.   Patient reports that he had an uneventful day and was laying down to sleep after eating a meal when he began to experience difficulty catching his breath.  This was accompanied by vague central nonradiating chest discomfort described as a burning sensation.  He was also having a mild headache.  EMS was called, patient was treated with aspirin, and brought into the ED.  He reports that his chest pain and headache have resolved and his shortness of breath has also resolved after treatments in the ED.  He reports chronic diarrhea that has not been any worse recently, denies vomiting, and denies any change in appetite.  He denies any change in urination.  Denies any change in his medications recently.  Denies any significant cough, and he denies fevers or chills.  ED Course: Upon arrival to the ED, patient is found to be afebrile and saturating mid 90s on room air with stable blood pressure.  EKG features sinus rhythm and chest x-ray notable for emphysematous changes but no acute finding.  Blood work notable for normal BNP, normal troponin x 2, potassium 5.5, BUN 88, and creatinine 2.30.  Patient was given a liter of normal saline and 2 DuoNeb treatments in the ED.  Review of Systems:  All other systems reviewed and apart from HPI, are negative.  Past Medical History:  Diagnosis Date   CAD (coronary artery disease)    a. 01/2010 PCI of LAD; b. 09/2014 PCI/DES of mLAD due to ISR. D1 80, D2 80(jailed), LCX 48m; c. 07/2016 NSTEMI/Cath: LM nl, LAD 63m ISR, 40d, D1  90ost, 80p, D2 90ost, RI min irregs, LCX min irregs, OM1 90 small, OM2/3 min irregs, RCA min irregs, RPLB1 90, EF 25-35%.   Chronic combined systolic and diastolic CHF (congestive heart failure) (HCC)    a. 07/2016 Echo: EF 30%, severe septal/anterior HK, Gr1 DD.   CKD (chronic kidney disease), stage III (HCC)    COPD (chronic obstructive pulmonary disease) (HCC)    DM2 (diabetes mellitus, type 2) (HCC)    Erectile dysfunction    HLD (hyperlipidemia)    HTN (hypertension)    Ischemic cardiomyopathy    a. 07/2016 Echo: EF 30% w/ sev septal/ant HK. Gr1 DD.    Past Surgical History:  Procedure Laterality Date   AMPUTATION Left 11/10/2020   Procedure: AMPUTATION BELOW KNEE;  Surgeon: Annice Needy, MD;  Location: [facility] ORS;  Service: General;  Laterality: Left;   BELOW KNEE LEG AMPUTATION     CAD: stent to the LAD     CARDIAC CATHETERIZATION  10/01/2014   CARDIAC CATHETERIZATION N/A 07/23/2016   Procedure: Left Heart Cath and Coronary Angiography;  Surgeon: Iran Ouch, MD;  Location: [facility] INVASIVE CV LAB;  Service: Cardiovascular;  Laterality: N/A;   CORONARY ANGIOPLASTY WITH STENT PLACEMENT  10/01/2014   LOWER EXTREMITY ANGIOGRAPHY Left 10/31/2020   Procedure: LOWER EXTREMITY ANGIOGRAPHY;  Surgeon: Annice Needy, MD;  Location: [facility] INVASIVE CV LAB;  Service: Cardiovascular;  Laterality: Left;   LOWER EXTREMITY ANGIOGRAPHY Right 11/20/2021   Procedure: Lower Extremity  Angiography;  Surgeon: Annice Needy, MD;  Location: [facility] INVASIVE CV LAB;  Service: Cardiovascular;  Laterality: Right;    Social History:   reports that he quit smoking about 12 years ago. His smoking use included cigarettes. He smoked an average of 3.00 packs per day. He has never used smokeless tobacco. He reports that he does not currently use alcohol after a past usage of about 7.0 standard drinks of alcohol per week. He reports that he does not use drugs.  Allergies  Allergen Reactions   Metformin And Related Diarrhea     If he takes it twice a day it causes diarhea, so he reduced to once daily    Family History  Problem Relation Age of Onset   Heart attack Father        complications   Cancer Brother      Prior to Admission medications   Medication Sig Start Date End Date Taking? Authorizing Provider  acetaminophen (TYLENOL) 325 MG tablet Take 2 tablets (650 mg total) by mouth every 4 (four) hours as needed for mild pain (or temp > 37.5 C (99.5 F)). 10/10/21   Dorcas Carrow, MD  albuterol (PROVENTIL) (2.5 MG/3ML) 0.083% nebulizer solution Take 3 mLs (2.5 mg total) by nebulization every 6 (six) hours as needed for wheezing or shortness of breath. 09/30/20   [name], MD  Albuterol Sulfate (PROAIR RESPICLICK) 108 (90 Base) MCG/ACT AEPB Inhale 1 puff into the lungs every 6 (six) hours as needed. 12/18/21   [name], MD  amLODipine (NORVASC) 5 MG tablet Take 1 tablet (5 mg total) by mouth daily. 04/27/22 05/27/22  [name], MD  aspirin EC 81 MG tablet Take 1 tablet (81 mg total) by mouth daily. 11/20/21   Annice Needy, MD  atorvastatin (LIPITOR) 80 MG tablet Take 1 tablet (80 mg total) by mouth daily. 01/03/22   [name], MD  butalbital-acetaminophen-caffeine (FIORICET) [phone] MG tablet Take 1 tablet by mouth every 8 (eight) hours as needed for headache. 10/10/21   Dorcas Carrow, MD  carvedilol (COREG) 25 MG tablet TAKE 1 TABLET BY MOUTH TWICE  DAILY WITH MEALS 01/23/22   Worthy Rancher B, FNP  clopidogrel (PLAVIX) 75 MG tablet Take 1 tablet (75 mg total) by mouth daily. 11/20/21   Annice Needy, MD  Continuous Blood Gluc Receiver (FREESTYLE LIBRE 2 READER) DEVI 1 Device by Does not apply route 4 (four) times daily. 04/27/22   [name], MD  Continuous Blood Gluc Sensor (FREESTYLE LIBRE 2 SENSOR) MISC USE 1 SENSOR EVERY 14 DAYS 04/27/22   [name], MD  empagliflozin (JARDIANCE) 25 MG TABS tablet Take 1 tablet (25 mg total) by mouth daily. 01/03/22   [name], MD  escitalopram (LEXAPRO) 10 MG tablet Take 1 tablet (10 mg total) by mouth daily. 01/03/22   [name], MD  fluticasone-salmeterol (ADVAIR) 250-50 MCG/ACT AEPB Inhale 1 puff into the lungs 2 (two) times daily. 07/24/21   [name], MD  furosemide (LASIX) 20 MG tablet TAKE 1 TABLET ( ) BY MOUTH ONCE EVERY OTHER DAY. 01/03/22   [name], MD  gabapentin (NEURONTIN) 300 MG capsule Take 1 capsule (300 mg total) by mouth at bedtime. 12/06/20   Georgiana Spinner, NP  HYDROcodone-acetaminophen (NORCO/VICODIN) 5-325 MG tablet Take 1 tablet by mouth every 4 (four) hours as needed for moderate pain. 02/19/22 02/19/23  Cuthriell, Delorise Royals, PA-C  Incontinence Supply Disposable (PROCARE ADULT BRIEFS  X-LARGE) MISC 1 Application by Does not apply route as needed (For incontinence). 04/30/22   [name], MD  insulin lispro (HUMALOG KWIKPEN) 100 UNIT/ML KwikPen Inject 0-15 Units into the skin 4 (four) times daily -  before meals and at bedtime. CBG < 70: Implement Hypoglycemia Standing Orders and refer to Hypoglycemia Standing Orders sidebar report CBG 70 - 120: 0 units CBG 121 - 150: 2 units CBG 151 - 200: 3 units CBG 201 - 250: 5 units CBG 251 - 300: 8 units CBG 301 - 350: 11 units CBG 351 - 400: 15 units CBG > 400: call MD 05/24/22   [name], MD  Insulin Pen Needle (BD PEN NEEDLE MICRO U/F) 32G X 6 MM MISC 1 EACH BY DOES NOT APPLY ROUTE 2 (TWO) TIMES DAILY. 05/28/22   [name], MD  isosorbide mononitrate (IMDUR) 30 MG 24 hr tablet TAKE 1 TABLET BY MOUTH EVERY DAY 04/03/21   [name], MD  nitroGLYCERIN (NITROSTAT) 0.4 MG SL tablet Place 0.4 mg under the tongue every 5 (five) minutes as needed for chest pain.    [provider]  nystatin ointment (MYCOSTATIN) Apply 1 Application topically 2 (two) times daily. 05/11/22   [name], MD  ondansetron (ZOFRAN) 4 MG tablet Take 4 mg by mouth every 6 (six) hours as needed. 11/15/21    [provider]  ondansetron (ZOFRAN-ODT) 4 MG disintegrating tablet Allow 1-2 tablets to dissolve in your mouth every 8 hours as needed for nausea/vomiting 02/08/22   Loleta Rose, MD  sacubitril-valsartan (ENTRESTO) 49-51 MG TAKE 1 TABLET BY MOUTH 2 (TWO) TIMES DAILY. STOP LISINOPRIL AND POTASSIUM 01/30/22   [name], MD  spironolactone (ALDACTONE) 25 MG tablet Take 1 tablet (25 mg total) by mouth daily. 01/30/22   [name], MD  tamsulosin (FLOMAX) 0.4 MG CAPS capsule Take 1 capsule (0.4 mg total) by mouth daily. 01/30/22   [name], MD  tirzepatide [facility]) 2.5 MG/0.5ML Pen INJECT 2.5 MG INTO THE SKIN ONCE A WEEK FOR 28 DAYS. 06/07/22   [name], MD  tirzepatide [facility]) 5 MG/0.5ML Pen Inject 5 mg into the skin once a week. Start after you finish the 28 day course of the 2.5 mg dose. 05/11/22   [name], MD    Physical Exam: Vitals:   06/30/22 1957 06/30/22 1958 06/30/22 2201 06/30/22 2230  BP:  (!) 137/116 91/60 100/71  Pulse:  86 86 79  Resp:  19 14 11   Temp:  98.2 F (36.8 C)    TempSrc:  Oral    SpO2: 95% 95% 92% 97%    Constitutional: NAD, no pallor or diaphoresis   Eyes: PERTLA, lids and conjunctivae normal ENMT: Mucous membranes are moist. Posterior pharynx clear of any exudate or lesions.   Neck: supple, no masses  Respiratory:  no wheezing, no crackles. No accessory muscle use.  Cardiovascular: S1 & S2 heard, regular rate and rhythm. No extremity edema.   Abdomen: No distension, no tenderness, soft. Bowel sounds active.  Musculoskeletal: no clubbing / cyanosis. S/p left BKA.   Skin: no significant rashes, lesions, ulcers. Warm, dry, well-perfused. Neurologic: CN 2-12 grossly intact. Moving all extremities. Alert and oriented.  Psychiatric: Calm. Cooperative.    Labs and Imaging on Admission: I have personally reviewed following labs and imaging studies  CBC: Recent Labs  Lab 06/30/22 2005  WBC 10.9*  HGB  15.2  HCT 47.0  MCV 95.5  PLT  234   Basic Metabolic Panel: Recent Labs  Lab 06/30/22 2005  NA 137  K 5.5*  CL 110  CO2 19*  GLUCOSE 160*  BUN 88*  CREATININE 2.30*  CALCIUM 8.7*   GFR: CrCl cannot be calculated (Unknown ideal weight.). Liver Function Tests: No results for input(s): "AST", "ALT", "ALKPHOS", "BILITOT", "PROT", "ALBUMIN" in the last 168 hours. No results for input(s): "LIPASE", "AMYLASE" in the last 168 hours. No results for input(s): "AMMONIA" in the last 168 hours. Coagulation Profile: Recent Labs  Lab 06/30/22 2005  INR 1.1   Cardiac Enzymes: No results for input(s): "CKTOTAL", "CKMB", "CKMBINDEX", "TROPONINI" in the last 168 hours. BNP (last 3 results) No results for input(s): "PROBNP" in the last 8760 hours. HbA1C: No results for input(s): "HGBA1C" in the last 72 hours. CBG: No results for input(s): "GLUCAP" in the last 168 hours. Lipid Profile: No results for input(s): "CHOL", "HDL", "LDLCALC", "TRIG", "CHOLHDL", "LDLDIRECT" in the last 72 hours. Thyroid Function Tests: No results for input(s): "TSH", "T4TOTAL", "FREET4", "T3FREE", "THYROIDAB" in the last 72 hours. Anemia Panel: No results for input(s): "VITAMINB12", "FOLATE", "FERRITIN", "TIBC", "IRON", "RETICCTPCT" in the last 72 hours. Urine analysis:    Component Value Date/Time   COLORURINE YELLOW (A) 06/04/2022 1929   APPEARANCEUR CLEAR (A) 06/04/2022 1929   APPEARANCEUR Clear 11/23/2014 2124   LABSPEC 1.026 06/04/2022 1929   LABSPEC 1.025 11/23/2014 2124   PHURINE 5.0 06/04/2022 1929   GLUCOSEU >=500 (A) 06/04/2022 1929   GLUCOSEU >=500 11/23/2014 2124   HGBUR NEGATIVE 06/04/2022 1929   BILIRUBINUR NEGATIVE 06/04/2022 1929   BILIRUBINUR Negative 11/23/2014 2124   KETONESUR NEGATIVE 06/04/2022 1929   PROTEINUR NEGATIVE 06/04/2022 1929   NITRITE NEGATIVE 06/04/2022 1929   LEUKOCYTESUR NEGATIVE 06/04/2022 1929   LEUKOCYTESUR Negative 11/23/2014 2124   Sepsis  Labs: @LABRCNTIP (procalcitonin:4,lacticidven:4) )No results found for this or any previous visit (from the past 240 hour(s)).   Radiological Exams on Admission: DG Chest 2 View  Result Date: 06/30/2022 CLINICAL DATA:  Chest pain and shortness of breath when lying down EXAM: CHEST - 2 VIEW COMPARISON:  Radiographs 06/04/2022 FINDINGS: Stable cardiomediastinal silhouette. Coronary stenting. Emphysema with scattered areas of scarring. Bullous change in the left-greater-than-right upper lungs. No focal consolidation or pleural effusion. No acute osseous abnormality. IMPRESSION: Advanced emphysema.  No active cardiopulmonary disease. Electronically Signed   By: 06/06/2022 M.D.   On: 06/30/2022 20:36    EKG: Independently reviewed. Sinus rhythm.   Assessment/Plan   1. AKI superimposed on CKD II  - SCr is 2.30 on admission with BUN 88, potassium 5.5 and serum bicarb 19; baseline SCr is closer to 1.2  - He was given a liter of NS in ED  - Hold Lasix, Aldactone, and Entresto, check renal 07/02/2022, UA, and FEUrea, continue gentle IVF hydration overnight, renally-dose medications, and repeat chem panel in am   2. Chest pain; CAD  - Chest pain resolved prior to admission, no acute ischemic features on EKG, serial troponin normal, d-dimer, normal, and no acute CXR findings  - Suspect the chest discomfort was most likely GI-related given character of sxs and occurrence when laying down after large meal  - Continue cardiac monitoring, continue antiplatelets, statin, and beta-blocker    3. COPD  - Not in exacerbation on admission   - Continue as-needed DuoNeb   4. Chronic systolic CHF   - Appears compensated  - Hold diuretics and Entresto in setting of AKI, continue Coreg, monitor volume status  5. Type  II DM - A1c was 7.4% in September 2023  - Check CBGs and use SSI only for now    6. Hypertension  - Continue Norvasc and Coreg    DVT prophylaxis: sq heparin  Code Status: Full  Level of  Care: Level of care: Telemetry Cardiac Family Communication: none present  Disposition Plan:  Patient is from: Home  Anticipated d/c is to: home Anticipated d/c date is: 11/26 or 07/02/22  Patient currently: Pending renal US, urine studies, repeat serum chemistries  Consults called: none  Admission status: Observation     Briscoe Deutscherimothy S Taijuan Serviss, MD [facility]  06/30/2022, 11:01 PM

## 2022-06-30 NOTE — ED Notes (Signed)
Pt states he takes a blood thinner, but doesn't know the name of it.

## 2022-06-30 NOTE — ED Provider Notes (Signed)
[facility] Provider Note    Event Date/Time   First MD Initiated Contact with Patient 06/30/22 2121     (approximate)   History   Shortness of Breath and Chest Pain   HPI  JAARON OLESON Sr. is a 71 y.o. male who presents to the emergency department today because of concerns for shortness of breath and chest pain.  He states that the symptoms started today.  Chest pain was located in the center part of his chest.  It was worse when he would lie flat.  He denies any cough.  He states that a lot has been going on around him recently. Denies any fevers or chills.      Physical Exam   Triage Vital Signs: ED Triage Vitals  Enc Vitals Group     BP 06/30/22 1958 (!) 137/116     Pulse Rate 06/30/22 1958 86     Resp 06/30/22 1958 19     Temp 06/30/22 1958 98.2 F (36.8 C)     Temp Source 06/30/22 1958 Oral     SpO2 06/30/22 1957 95 %     Weight --      Height --      Head Circumference --      Peak Flow --      Pain Score 06/30/22 1958 2     Pain Loc --      Pain Edu? --      Excl. in GC? --     Most recent vital signs: Vitals:   06/30/22 1957 06/30/22 1958  BP:  (!) 137/116  Pulse:  86  Resp:  19  Temp:  98.2 F (36.8 C)  SpO2: 95% 95%   General: Awake, alert, oriented. CV:  Good peripheral perfusion. Regular rate and rhythm. Resp:  Normal effort. Lungs clear. Abd:  No distention.     ED Results / Procedures / Treatments   Labs (all labs ordered are listed, but only abnormal results are displayed) Labs Reviewed  BASIC METABOLIC PANEL - Abnormal; Notable for the following components:      Result Value   Potassium 5.5 (*)    CO2 19 (*)    Glucose, Bld 160 (*)    BUN 88 (*)    Creatinine, Ser 2.30 (*)    Calcium 8.7 (*)    GFR, Estimated 30 (*)    All other components within normal limits  CBC - Abnormal; Notable for the following components:   WBC 10.9 (*)    All other components within normal limits  PROTIME-INR  D-DIMER,  QUANTITATIVE  BRAIN NATRIURETIC PEPTIDE  TROPONIN I (HIGH SENSITIVITY)     EKG  I, Phineas Semen, attending physician, personally viewed and interpreted this EKG  EKG Time: 2003 Rate: 96 Rhythm: sinus rhythm Axis: normal Intervals: qtc 464 QRS: narrow ST changes: no st elevation Impression: normal ekg    RADIOLOGY I independently interpreted and visualized the CXR. My interpretation: No pneumonia Radiology interpretation:  IMPRESSION:  Advanced emphysema.  No active cardiopulmonary disease.      PROCEDURES:  Critical Care performed: No  Procedures   MEDICATIONS ORDERED IN ED: Medications - No data to display   IMPRESSION / MDM / ASSESSMENT AND PLAN / ED COURSE  I reviewed the triage vital signs and the nursing notes.  Differential diagnosis includes, but is not limited to, pneumonia, covid, ACS, PE  Patient's presentation is most consistent with acute presentation with potential threat to life or bodily function.  Patient presented to the emergency department today because of concerns for shortness of breath and chest pain.  On exam lungs were clear.  Initial troponin was negative.  Chest x-ray without concerning pneumonia.  However blood work did show findings concerning for AKI with elevated creatinine as well as elevated potassium.  We will start IV fluids.  Discussed with Dr. Antionette Char with the hospital service will plan on admission.     FINAL CLINICAL IMPRESSION(S) / ED DIAGNOSES   Final diagnoses:  Shortness of breath  AKI (acute kidney injury) (HCC)    Note:  This document was prepared using Dragon voice recognition software and may include unintentional dictation errors.    Phineas Semen, MD 06/30/22 2231

## 2022-07-01 DIAGNOSIS — R0789 Other chest pain: Secondary | ICD-10-CM | POA: Diagnosis not present

## 2022-07-01 DIAGNOSIS — N182 Chronic kidney disease, stage 2 (mild): Secondary | ICD-10-CM | POA: Diagnosis not present

## 2022-07-01 DIAGNOSIS — N179 Acute kidney failure, unspecified: Secondary | ICD-10-CM | POA: Diagnosis not present

## 2022-07-01 LAB — BASIC METABOLIC PANEL
Anion gap: 7 (ref 5–15)
BUN: 86 mg/dL — ABNORMAL HIGH (ref 8–23)
CO2: 16 mmol/L — ABNORMAL LOW (ref 22–32)
Calcium: 8.8 mg/dL — ABNORMAL LOW (ref 8.9–10.3)
Chloride: 114 mmol/L — ABNORMAL HIGH (ref 98–111)
Creatinine, Ser: 1.66 mg/dL — ABNORMAL HIGH (ref 0.61–1.24)
GFR, Estimated: 44 mL/min — ABNORMAL LOW (ref >60)
Glucose, Bld: 162 mg/dL — ABNORMAL HIGH (ref 70–99)
Potassium: 4.4 mmol/L (ref 3.5–5.1)
Sodium: 137 mmol/L (ref 135–145)

## 2022-07-01 LAB — CBC
HCT: 45.1 % (ref 39.0–52.0)
Hemoglobin: 14.8 g/dL (ref 13.0–17.0)
MCH: 31.1 pg (ref 26.0–34.0)
MCHC: 32.8 g/dL (ref 30.0–36.0)
MCV: 94.7 fL (ref 80.0–100.0)
Platelets: 202 10*3/uL (ref 150–400)
RBC: 4.76 MIL/uL (ref 4.22–5.81)
RDW: 13.3 % (ref 11.5–15.5)
WBC: 9.8 10*3/uL (ref 4.0–10.5)
nRBC: 0 % (ref 0.0–0.2)

## 2022-07-01 LAB — CBG MONITORING, ED
Glucose-Capillary: 206 mg/dL — ABNORMAL HIGH (ref 70–99)
Glucose-Capillary: 200 mg/dL — ABNORMAL HIGH (ref 70–99)

## 2022-07-01 LAB — CREATININE, URINE, RANDOM: Creatinine, Urine: 175 mg/dL

## 2022-07-01 LAB — SODIUM, URINE, RANDOM: Sodium, Ur: 38 mmol/L

## 2022-07-01 LAB — MAGNESIUM: Magnesium: 2.4 mg/dL (ref 1.7–2.4)

## 2022-07-01 LAB — PHOSPHORUS: Phosphorus: 4.4 mg/dL (ref 2.5–4.6)

## 2022-07-01 MED ORDER — SPIRONOLACTONE 25 MG PO TABS: ORAL_TABLET | ORAL | 1 refills | Status: DC

## 2022-07-01 MED ORDER — FUROSEMIDE 20 MG PO TABS: ORAL_TABLET | ORAL | 3 refills | Status: DC

## 2022-07-01 MED ORDER — ENTRESTO 49-51 MG PO TABS: ORAL_TABLET | ORAL | 1 refills | Status: DC

## 2022-07-01 NOTE — Discharge Summary (Signed)
Physician Discharge Summary   Danny GREENLEY Sr.  male DOB: 11-17-1950  HQI:[id number]  PCP: [name], MD  Admit date: 06/30/2022 Discharge date: 07/01/2022  Admitted From: home Disposition:  home CODE STATUS: Full code  Discharge Instructions     Diet - low sodium heart healthy   Complete by: As directed    Discharge instructions   Complete by: As directed    You have acute kidney injury.  Please hold your lasix, spironolactone and Entresto until you see your cardiologist.   Dr. Darlin Priestly [facility] Course:  For full details, please see H&P, progress notes, consult notes and ancillary notes.  Briefly,  Danny Friends Eppinger Sr. is a 71 y.o. male with medical history significant for hypertension, diabetes mellitus, COPD, hypertension, CAD, chronic systolic CHF, and PAD who presented to the emergency department with chest discomfort and shortness of breath.   1. AKI superimposed on CKD II  - SCr is 2.30 on admission with BUN 88, potassium 5.5 and serum bicarb 19; baseline SCr is closer to 1.2    - He was given a liter of NS in ED, with improvement Cr to 1.66 the next day.   AKI may be due to home diuretics.   - home Lasix, Aldactone, and Entresto were held and BP was only 110's prior to discharge.   --cont to hold Lasix, Aldactone, and Entresto at discharge, pending outpatient cardio f/u.  I had sent a message to oncall physician for cardio [facility] to ensure pt has a f/u appointment 1-2 weeks after discharge.   2. Chest pain; CAD  - Chest pain resolved prior to admission, no acute ischemic features on EKG, serial troponin normal, d-dimer, normal, and no acute CXR findings  - Suspect the chest discomfort was most likely GI-related given character of sxs and occurrence when laying down after large meal  --cont home ASA, plavix and statin  3. COPD  - Not in exacerbation on admission   --cont home Advair   4. Chronic systolic CHF   - Appears compensated  - Hold  diuretics and Entresto in setting of AKI, continue Coreg. --I had sent a message to oncall physician for cardio [facility] to ensure pt has a f/u appointment 1-2 weeks after discharge.   5. Type II DM - A1c was 7.4% in September 2023  --discharged on home regimen.   6. Hypertension  - Continue Norvasc, Imdur and Coreg  --Hold diuretics and Entresto in setting of AKI.   Discharge Diagnoses:  Principal Problem:   Acute renal failure superimposed on stage 2 chronic kidney disease (HCC) Active Problems:   Type 2 diabetes mellitus with hyperlipidemia (HCC)   Essential hypertension   COPD with chronic bronchitis   Chest pain   Coronary artery disease of native artery of native heart with stable angina pectoris (HCC)   HFrEF (heart failure with reduced ejection fraction) (HCC)     Discharge Instructions:  Allergies as of 07/01/2022       Reactions   Metformin And Related Diarrhea   If he takes it twice a day it causes diarhea, so he reduced to once daily        Medication List     TAKE these medications    acetaminophen 325 MG tablet Commonly known as: TYLENOL Take 2 tablets (650 mg total) by mouth every 4 (four) hours as needed for mild pain (or temp > 37.5 C (99.5 F)).  albuterol (2.5 MG/3ML) 0.083% nebulizer solution Commonly known as: PROVENTIL Take 3 mLs (2.5 mg total) by nebulization every 6 (six) hours as needed for wheezing or shortness of breath.   ProAir RespiClick 108 (90 Base) MCG/ACT Aepb Generic drug: Albuterol Sulfate Inhale 1 puff into the lungs every 6 (six) hours as needed.   amLODipine 5 MG tablet Commonly known as: NORVASC Take 1 tablet (5 mg total) by mouth daily.   aspirin EC 81 MG tablet Take 1 tablet (81 mg total) by mouth daily.   atorvastatin 80 MG tablet Commonly known as: LIPITOR Take 1 tablet (80 mg total) by mouth daily.   BD Pen Needle Micro U/F 32G X 6 MM Misc Generic drug: Insulin Pen Needle 1 EACH BY DOES NOT APPLY ROUTE 2 (TWO)  TIMES DAILY.   butalbital-acetaminophen-caffeine 50-325-40 MG tablet Commonly known as: FIORICET Take 1 tablet by mouth every 8 (eight) hours as needed for headache.   carvedilol 25 MG tablet Commonly known as: COREG TAKE 1 TABLET BY MOUTH TWICE  DAILY WITH MEALS   clopidogrel 75 MG tablet Commonly known as: Plavix Take 1 tablet (75 mg total) by mouth daily.   empagliflozin 25 MG Tabs tablet Commonly known as: Jardiance Take 1 tablet (25 mg total) by mouth daily.   Entresto 49-51 MG Generic drug: sacubitril-valsartan Hold until followup with cardiology due to acute kidney injury. What changed: additional instructions   escitalopram 10 MG tablet Commonly known as: LEXAPRO Take 1 tablet (10 mg total) by mouth daily.   fluticasone-salmeterol 250-50 MCG/ACT Aepb Commonly known as: ADVAIR Inhale 1 puff into the lungs 2 (two) times daily.   FreeStyle Libre 2 Reader Devi 1 Device by Does not apply route 4 (four) times daily.   FreeStyle Libre 2 Sensor Misc USE 1 SENSOR EVERY 14 DAYS   furosemide 20 MG tablet Commonly known as: LASIX Hold until followup with cardiology due to acute kidney injury. What changed: additional instructions   gabapentin 300 MG capsule Commonly known as: Neurontin Take 1 capsule (300 mg total) by mouth at bedtime.   HYDROcodone-acetaminophen 5-325 MG tablet Commonly known as: NORCO/VICODIN Take 1 tablet by mouth every 4 (four) hours as needed for moderate pain.   insulin lispro 100 UNIT/ML KwikPen Commonly known as: HumaLOG KwikPen Inject 0-15 Units into the skin 4 (four) times daily -  before meals and at bedtime. CBG < 70: Implement Hypoglycemia Standing Orders and refer to Hypoglycemia Standing Orders sidebar report CBG 70 - 120: 0 units CBG 121 - 150: 2 units CBG 151 - 200: 3 units CBG 201 - 250: 5 units CBG 251 - 300: 8 units CBG 301 - 350: 11 units CBG 351 - 400: 15 units CBG > 400: call MD   isosorbide mononitrate 30 MG 24 hr  tablet Commonly known as: IMDUR TAKE 1 TABLET BY MOUTH EVERY DAY   nitroGLYCERIN 0.4 MG SL tablet Commonly known as: NITROSTAT Place 0.4 mg under the tongue every 5 (five) minutes as needed for chest pain.   nystatin ointment Commonly known as: MYCOSTATIN Apply 1 Application topically 2 (two) times daily.   ondansetron 4 MG disintegrating tablet Commonly known as: ZOFRAN-ODT Allow 1-2 tablets to dissolve in your mouth every 8 hours as needed for nausea/vomiting   ondansetron 4 MG tablet Commonly known as: ZOFRAN Take 4 mg by mouth every 6 (six) hours as needed.   PROCare Adult Briefs X-Large Misc 1 Application by Does not apply route as needed (For incontinence).  spironolactone 25 MG tablet Commonly known as: ALDACTONE Hold until followup with cardiology due to acute kidney injury. What changed:  how much to take how to take this when to take this additional instructions   tamsulosin 0.4 MG Caps capsule Commonly known as: FLOMAX Take 1 capsule (0.4 mg total) by mouth daily.   tirzepatide 5 MG/0.5ML Pen Commonly known as: MOUNJARO Inject 5 mg into the skin once a week. Start after you finish the 28 day course of the 2.5 mg dose.   Mounjaro 2.5 MG/0.5ML Pen Generic drug: tirzepatide INJECT 2.5 MG INTO THE SKIN ONCE A WEEK FOR 28 DAYS.         Follow-up Information     [name], MD Follow up in 1 week(s).   Specialty: Family Medicine Contact information: [address] [place] [address] [phone]                 Allergies  Allergen Reactions   Metformin And Related Diarrhea    If he takes it twice a day it causes diarhea, so he reduced to once daily     The results of significant diagnostics from this hospitalization (including imaging, microbiology, ancillary and laboratory) are listed below for reference.   Consultations:   Procedures/Studies: US RENAL  Result Date: 07/01/2022 CLINICAL DATA:  Acute renal  failure. EXAM: RENAL / URINARY TRACT ULTRASOUND COMPLETE COMPARISON:  None Available. FINDINGS: Right Kidney: Renal measurements: 10.6 cm x 6.4 cm x 7.2 cm = volume: 256 mL. Diffusely increased echogenicity of the renal parenchyma is noted. No mass or hydronephrosis visualized. Left Kidney: Renal measurements: 11.7 cm x 6.2 cm x 6.0 cm = volume: 227 mL. Diffusely increased echogenicity of the renal parenchyma is noted. No mass or hydronephrosis visualized. Bladder: Poorly distended and subsequently limited in evaluation. Other: None. IMPRESSION: Bilateral echogenic kidneys which may be secondary to medical renal disease. Electronically Signed   By: Aram Candela M.D.   On: 07/01/2022 00:26   DG Chest 2 View  Result Date: 06/30/2022 CLINICAL DATA:  Chest pain and shortness of breath when lying down EXAM: CHEST - 2 VIEW COMPARISON:  Radiographs 06/04/2022 FINDINGS: Stable cardiomediastinal silhouette. Coronary stenting. Emphysema with scattered areas of scarring. Bullous change in the left-greater-than-right upper lungs. No focal consolidation or pleural effusion. No acute osseous abnormality. IMPRESSION: Advanced emphysema.  No active cardiopulmonary disease. Electronically Signed   By: Minerva Fester M.D.   On: 06/30/2022 20:36   DG Chest 2 View  Result Date: 06/04/2022 CLINICAL DATA:  Increased peripheral edema. History of CHF. EXAM: CHEST - 2 VIEW COMPARISON:  Radiograph and CT 4 days ago 05/31/2022 FINDINGS: Advanced emphysema again seen. Stable heart size and mediastinal contours. No pulmonary edema or pleural effusion. Bandlike scarring in the right mid lung. No acute airspace disease. Pulmonary nodules on recent CT are not seen by radiograph. Stable osseous structures. IMPRESSION: 1. Advanced emphysema. 2. No evidence of CHF or fluid overload. Electronically Signed   By: Narda Rutherford M.D.   On: 06/04/2022 20:08      Labs: BNP (last 3 results) Recent Labs    05/31/22 1505  06/04/22 1929 06/30/22 2005  BNP 121.4* 53.5 42.6   Basic Metabolic Panel: Recent Labs  Lab 06/30/22 2005 07/01/22 0452  NA 137 137  K 5.5* 4.4  CL 110 114*  CO2 19* 16*  GLUCOSE 160* 162*  BUN 88* 86*  CREATININE 2.30* 1.66*  CALCIUM 8.7* 8.8*  MG  --  2.4  PHOS  --  4.4   Liver Function Tests: No results for input(s): "AST", "ALT", "ALKPHOS", "BILITOT", "PROT", "ALBUMIN" in the last 168 hours. No results for input(s): "LIPASE", "AMYLASE" in the last 168 hours. No results for input(s): "AMMONIA" in the last 168 hours. CBC: Recent Labs  Lab 06/30/22 2005 07/01/22 0452  WBC 10.9* 9.8  HGB 15.2 14.8  HCT 47.0 45.1  MCV 95.5 94.7  PLT 234 202   Cardiac Enzymes: No results for input(s): "CKTOTAL", "CKMB", "CKMBINDEX", "TROPONINI" in the last 168 hours. BNP: Invalid input(s): "POCBNP" CBG: Recent Labs  Lab 06/30/22 2349 07/01/22 0725  GLUCAP 134* 206*   D-Dimer Recent Labs    06/30/22 2005  DDIMER 0.41   Hgb A1c No results for input(s): "HGBA1C" in the last 72 hours. Lipid Profile No results for input(s): "CHOL", "HDL", "LDLCALC", "TRIG", "CHOLHDL", "LDLDIRECT" in the last 72 hours. Thyroid function studies No results for input(s): "TSH", "T4TOTAL", "T3FREE", "THYROIDAB" in the last 72 hours.  Invalid input(s): "FREET3" Anemia work up No results for input(s): "VITAMINB12", "FOLATE", "FERRITIN", "TIBC", "IRON", "RETICCTPCT" in the last 72 hours. Urinalysis    Component Value Date/Time   COLORURINE YELLOW (A) 06/30/2022 2315   APPEARANCEUR CLEAR (A) 06/30/2022 2315   APPEARANCEUR Clear 11/23/2014 2124   LABSPEC 1.016 06/30/2022 2315   LABSPEC 1.025 11/23/2014 2124   PHURINE 5.0 06/30/2022 2315   GLUCOSEU >=500 (A) 06/30/2022 2315   GLUCOSEU >=500 11/23/2014 2124   HGBUR NEGATIVE 06/30/2022 2315   BILIRUBINUR NEGATIVE 06/30/2022 2315   BILIRUBINUR Negative 11/23/2014 2124   KETONESUR NEGATIVE 06/30/2022 2315   PROTEINUR NEGATIVE 06/30/2022 2315    NITRITE NEGATIVE 06/30/2022 2315   LEUKOCYTESUR NEGATIVE 06/30/2022 2315   LEUKOCYTESUR Negative 11/23/2014 2124   Sepsis Labs Recent Labs  Lab 06/30/22 2005 07/01/22 0452  WBC 10.9* 9.8   Microbiology Recent Results (from the past 240 hour(s))  Resp Panel by RT-PCR (Flu A&B, Covid) Anterior Nasal Swab     Status: None   Collection Time: 06/30/22 10:09 PM   Specimen: Anterior Nasal Swab  Result Value Ref Range Status   SARS Coronavirus 2 by RT PCR NEGATIVE NEGATIVE Final    Comment: (NOTE) SARS-CoV-2 target nucleic acids are NOT DETECTED.  The SARS-CoV-2 RNA is generally detectable in upper respiratory specimens during the acute phase of infection. The lowest concentration of SARS-CoV-2 viral copies this assay can detect is 138 copies/mL. A negative result does not preclude SARS-Cov-2 infection and should not be used as the sole basis for treatment or other patient management decisions. A negative result may occur with  improper specimen collection/handling, submission of specimen other than nasopharyngeal swab, presence of viral mutation(s) within the areas targeted by this assay, and inadequate number of viral copies(<138 copies/mL). A negative result must be combined with clinical observations, patient history, and epidemiological information. The expected result is Negative.  Fact Sheet for Patients:  BloggerCourse.com  Fact Sheet for Healthcare Providers:  SeriousBroker.it  This test is no t yet approved or cleared by the [place] FDA and  has been authorized for detection and/or diagnosis of SARS-CoV-2 by FDA under an Emergency Use Authorization (EUA). This EUA will remain  in effect (meaning this test can be used) for the duration of the COVID-19 declaration under Section 564(b)(1) of the Act, 21 U.S.C.section 360bbb-3(b)(1), unless the authorization is terminated  or revoked sooner.       Influenza A by  PCR NEGATIVE NEGATIVE Final   Influenza B by  PCR NEGATIVE NEGATIVE Final    Comment: (NOTE) The Xpert Xpress SARS-CoV-2/FLU/RSV plus assay is intended as an aid in the diagnosis of influenza from Nasopharyngeal swab specimens and should not be used as a sole basis for treatment. Nasal washings and aspirates are unacceptable for Xpert Xpress SARS-CoV-2/FLU/RSV testing.  Fact Sheet for Patients: BloggerCourse.comhttps://www.fda.gov/media/152166/download  Fact Sheet for Healthcare Providers: SeriousBroker.ithttps://www.fda.gov/media/152162/download  This test is not yet approved or cleared by the [place] FDA and has been authorized for detection and/or diagnosis of SARS-CoV-2 by FDA under an Emergency Use Authorization (EUA). This EUA will remain in effect (meaning this test can be used) for the duration of the COVID-19 declaration under Section 564(b)(1) of the Act, 21 U.S.C. section 360bbb-3(b)(1), unless the authorization is terminated or revoked.  Performed at [facility], [address] [place], [place] [id number]      Total time spend on discharging this patient, including the last patient exam, discussing the hospital stay, instructions for ongoing care as it relates to all pertinent caregivers, as well as preparing the medical discharge records, prescriptions, and/or referrals as applicable, is 40 minutes.    Darlin Priestlyina Sandeep Delagarza, MD  [facility] 07/01/2022, 8:54 AM

## 2022-07-01 NOTE — ED Notes (Signed)
Called [facility] for transport to [address] Dr. Nicholes Rough [place] [phone]

## 2022-07-01 NOTE — ED Notes (Signed)
Pt will go to room 33 in cpod. Report given.

## 2022-07-01 NOTE — ED Notes (Signed)
Pt had a bowel movement. Pt cleaned. Dry brief placed on pt. Pt given paper scrub shirt to go home in later today as pt's shirt got wet. Pt given a cup of water, call bell within reach, no other needs at this time.

## 2022-07-02 ENCOUNTER — Ambulatory Visit: Payer: Medicare Other | Admitting: Cardiovascular Disease

## 2022-07-02 LAB — UREA NITROGEN, URINE: Urea Nitrogen, Ur: 765 mg/dL

## 2022-07-04 ENCOUNTER — Telehealth: Payer: Self-pay | Admitting: Family Medicine

## 2022-07-04 NOTE — Telephone Encounter (Signed)
Thomasenia Sales, [phone], [facility] nurse. She would like to speak to Dr Purvis Sheffield CMA about patient. Patient has become non complaint . Life change about to happen, son is moving out  and patient will be living by himself. Son thinks that patient should go in a nursing home, but then changes his mind.

## 2022-07-05 DIAGNOSIS — Z9181 History of falling: Secondary | ICD-10-CM | POA: Diagnosis not present

## 2022-07-05 DIAGNOSIS — J432 Centrilobular emphysema: Secondary | ICD-10-CM | POA: Diagnosis not present

## 2022-07-05 DIAGNOSIS — S31109D Unspecified open wound of abdominal wall, unspecified quadrant without penetration into peritoneal cavity, subsequent encounter: Secondary | ICD-10-CM | POA: Diagnosis not present

## 2022-07-05 DIAGNOSIS — Z7982 Long term (current) use of aspirin: Secondary | ICD-10-CM | POA: Diagnosis not present

## 2022-07-05 DIAGNOSIS — Z89512 Acquired absence of left leg below knee: Secondary | ICD-10-CM | POA: Diagnosis not present

## 2022-07-05 DIAGNOSIS — E1165 Type 2 diabetes mellitus with hyperglycemia: Secondary | ICD-10-CM | POA: Diagnosis not present

## 2022-07-05 DIAGNOSIS — Z8673 Personal history of transient ischemic attack (TIA), and cerebral infarction without residual deficits: Secondary | ICD-10-CM | POA: Diagnosis not present

## 2022-07-05 DIAGNOSIS — Z87891 Personal history of nicotine dependence: Secondary | ICD-10-CM | POA: Diagnosis not present

## 2022-07-05 DIAGNOSIS — I10 Essential (primary) hypertension: Secondary | ICD-10-CM | POA: Diagnosis not present

## 2022-07-05 DIAGNOSIS — Z7902 Long term (current) use of antithrombotics/antiplatelets: Secondary | ICD-10-CM | POA: Diagnosis not present

## 2022-07-08 DIAGNOSIS — Z87891 Personal history of nicotine dependence: Secondary | ICD-10-CM | POA: Diagnosis not present

## 2022-07-08 DIAGNOSIS — E1165 Type 2 diabetes mellitus with hyperglycemia: Secondary | ICD-10-CM | POA: Diagnosis not present

## 2022-07-08 DIAGNOSIS — J432 Centrilobular emphysema: Secondary | ICD-10-CM | POA: Diagnosis not present

## 2022-07-08 DIAGNOSIS — S31109D Unspecified open wound of abdominal wall, unspecified quadrant without penetration into peritoneal cavity, subsequent encounter: Secondary | ICD-10-CM | POA: Diagnosis not present

## 2022-07-08 DIAGNOSIS — Z7902 Long term (current) use of antithrombotics/antiplatelets: Secondary | ICD-10-CM | POA: Diagnosis not present

## 2022-07-08 DIAGNOSIS — Z7982 Long term (current) use of aspirin: Secondary | ICD-10-CM | POA: Diagnosis not present

## 2022-07-08 DIAGNOSIS — Z89512 Acquired absence of left leg below knee: Secondary | ICD-10-CM | POA: Diagnosis not present

## 2022-07-08 DIAGNOSIS — I10 Essential (primary) hypertension: Secondary | ICD-10-CM | POA: Diagnosis not present

## 2022-07-08 DIAGNOSIS — Z9181 History of falling: Secondary | ICD-10-CM | POA: Diagnosis not present

## 2022-07-08 DIAGNOSIS — Z8673 Personal history of transient ischemic attack (TIA), and cerebral infarction without residual deficits: Secondary | ICD-10-CM | POA: Diagnosis not present

## 2022-07-09 DIAGNOSIS — Z7902 Long term (current) use of antithrombotics/antiplatelets: Secondary | ICD-10-CM | POA: Diagnosis not present

## 2022-07-09 DIAGNOSIS — Z7982 Long term (current) use of aspirin: Secondary | ICD-10-CM | POA: Diagnosis not present

## 2022-07-09 DIAGNOSIS — Z89512 Acquired absence of left leg below knee: Secondary | ICD-10-CM | POA: Diagnosis not present

## 2022-07-09 DIAGNOSIS — Z8673 Personal history of transient ischemic attack (TIA), and cerebral infarction without residual deficits: Secondary | ICD-10-CM | POA: Diagnosis not present

## 2022-07-09 DIAGNOSIS — Z9181 History of falling: Secondary | ICD-10-CM | POA: Diagnosis not present

## 2022-07-09 DIAGNOSIS — E1165 Type 2 diabetes mellitus with hyperglycemia: Secondary | ICD-10-CM | POA: Diagnosis not present

## 2022-07-09 DIAGNOSIS — Z87891 Personal history of nicotine dependence: Secondary | ICD-10-CM | POA: Diagnosis not present

## 2022-07-09 DIAGNOSIS — S31109D Unspecified open wound of abdominal wall, unspecified quadrant without penetration into peritoneal cavity, subsequent encounter: Secondary | ICD-10-CM | POA: Diagnosis not present

## 2022-07-09 DIAGNOSIS — I10 Essential (primary) hypertension: Secondary | ICD-10-CM | POA: Diagnosis not present

## 2022-07-09 DIAGNOSIS — J432 Centrilobular emphysema: Secondary | ICD-10-CM | POA: Diagnosis not present

## 2022-07-10 DIAGNOSIS — E1165 Type 2 diabetes mellitus with hyperglycemia: Secondary | ICD-10-CM | POA: Diagnosis not present

## 2022-07-10 DIAGNOSIS — J432 Centrilobular emphysema: Secondary | ICD-10-CM | POA: Diagnosis not present

## 2022-07-10 DIAGNOSIS — Z9181 History of falling: Secondary | ICD-10-CM | POA: Diagnosis not present

## 2022-07-10 DIAGNOSIS — S31109D Unspecified open wound of abdominal wall, unspecified quadrant without penetration into peritoneal cavity, subsequent encounter: Secondary | ICD-10-CM | POA: Diagnosis not present

## 2022-07-10 DIAGNOSIS — Z7982 Long term (current) use of aspirin: Secondary | ICD-10-CM | POA: Diagnosis not present

## 2022-07-10 DIAGNOSIS — Z89512 Acquired absence of left leg below knee: Secondary | ICD-10-CM | POA: Diagnosis not present

## 2022-07-10 DIAGNOSIS — Z87891 Personal history of nicotine dependence: Secondary | ICD-10-CM | POA: Diagnosis not present

## 2022-07-10 DIAGNOSIS — Z7902 Long term (current) use of antithrombotics/antiplatelets: Secondary | ICD-10-CM | POA: Diagnosis not present

## 2022-07-10 DIAGNOSIS — Z8673 Personal history of transient ischemic attack (TIA), and cerebral infarction without residual deficits: Secondary | ICD-10-CM | POA: Diagnosis not present

## 2022-07-10 DIAGNOSIS — I10 Essential (primary) hypertension: Secondary | ICD-10-CM | POA: Diagnosis not present

## 2022-07-12 DIAGNOSIS — E1165 Type 2 diabetes mellitus with hyperglycemia: Secondary | ICD-10-CM | POA: Diagnosis not present

## 2022-07-12 DIAGNOSIS — Z8673 Personal history of transient ischemic attack (TIA), and cerebral infarction without residual deficits: Secondary | ICD-10-CM | POA: Diagnosis not present

## 2022-07-12 DIAGNOSIS — J432 Centrilobular emphysema: Secondary | ICD-10-CM | POA: Diagnosis not present

## 2022-07-12 DIAGNOSIS — Z87891 Personal history of nicotine dependence: Secondary | ICD-10-CM | POA: Diagnosis not present

## 2022-07-12 DIAGNOSIS — Z7982 Long term (current) use of aspirin: Secondary | ICD-10-CM | POA: Diagnosis not present

## 2022-07-12 DIAGNOSIS — S31109D Unspecified open wound of abdominal wall, unspecified quadrant without penetration into peritoneal cavity, subsequent encounter: Secondary | ICD-10-CM | POA: Diagnosis not present

## 2022-07-12 DIAGNOSIS — Z89512 Acquired absence of left leg below knee: Secondary | ICD-10-CM | POA: Diagnosis not present

## 2022-07-12 DIAGNOSIS — Z9181 History of falling: Secondary | ICD-10-CM | POA: Diagnosis not present

## 2022-07-12 DIAGNOSIS — Z7902 Long term (current) use of antithrombotics/antiplatelets: Secondary | ICD-10-CM | POA: Diagnosis not present

## 2022-07-12 DIAGNOSIS — I10 Essential (primary) hypertension: Secondary | ICD-10-CM | POA: Diagnosis not present

## 2022-07-14 ENCOUNTER — Other Ambulatory Visit: Payer: Self-pay | Admitting: Cardiovascular Disease

## 2022-07-14 ENCOUNTER — Other Ambulatory Visit: Payer: Self-pay | Admitting: Family Medicine

## 2022-07-14 DIAGNOSIS — B372 Candidiasis of skin and nail: Secondary | ICD-10-CM

## 2022-07-14 DIAGNOSIS — E1169 Type 2 diabetes mellitus with other specified complication: Secondary | ICD-10-CM

## 2022-07-14 DIAGNOSIS — E785 Hyperlipidemia, unspecified: Secondary | ICD-10-CM

## 2022-07-14 DIAGNOSIS — I10 Essential (primary) hypertension: Secondary | ICD-10-CM

## 2022-07-16 DIAGNOSIS — S31109D Unspecified open wound of abdominal wall, unspecified quadrant without penetration into peritoneal cavity, subsequent encounter: Secondary | ICD-10-CM | POA: Diagnosis not present

## 2022-07-16 DIAGNOSIS — E1165 Type 2 diabetes mellitus with hyperglycemia: Secondary | ICD-10-CM | POA: Diagnosis not present

## 2022-07-16 DIAGNOSIS — Z7982 Long term (current) use of aspirin: Secondary | ICD-10-CM | POA: Diagnosis not present

## 2022-07-16 DIAGNOSIS — J432 Centrilobular emphysema: Secondary | ICD-10-CM | POA: Diagnosis not present

## 2022-07-16 DIAGNOSIS — I10 Essential (primary) hypertension: Secondary | ICD-10-CM | POA: Diagnosis not present

## 2022-07-16 DIAGNOSIS — Z87891 Personal history of nicotine dependence: Secondary | ICD-10-CM | POA: Diagnosis not present

## 2022-07-16 DIAGNOSIS — Z8673 Personal history of transient ischemic attack (TIA), and cerebral infarction without residual deficits: Secondary | ICD-10-CM | POA: Diagnosis not present

## 2022-07-16 DIAGNOSIS — Z9181 History of falling: Secondary | ICD-10-CM | POA: Diagnosis not present

## 2022-07-16 DIAGNOSIS — Z89512 Acquired absence of left leg below knee: Secondary | ICD-10-CM | POA: Diagnosis not present

## 2022-07-16 DIAGNOSIS — Z7902 Long term (current) use of antithrombotics/antiplatelets: Secondary | ICD-10-CM | POA: Diagnosis not present

## 2022-07-16 NOTE — Telephone Encounter (Signed)
Good Morning,   Could you please schedule this patient a overdue follow up visit. The patient was last seen on 10-26-20. Thank you so much.

## 2022-07-17 DIAGNOSIS — Z89512 Acquired absence of left leg below knee: Secondary | ICD-10-CM | POA: Diagnosis not present

## 2022-07-17 DIAGNOSIS — J449 Chronic obstructive pulmonary disease, unspecified: Secondary | ICD-10-CM | POA: Diagnosis not present

## 2022-07-17 DIAGNOSIS — J432 Centrilobular emphysema: Secondary | ICD-10-CM | POA: Diagnosis not present

## 2022-07-17 DIAGNOSIS — E1165 Type 2 diabetes mellitus with hyperglycemia: Secondary | ICD-10-CM | POA: Diagnosis not present

## 2022-07-17 DIAGNOSIS — Z8673 Personal history of transient ischemic attack (TIA), and cerebral infarction without residual deficits: Secondary | ICD-10-CM | POA: Diagnosis not present

## 2022-07-17 DIAGNOSIS — S31109D Unspecified open wound of abdominal wall, unspecified quadrant without penetration into peritoneal cavity, subsequent encounter: Secondary | ICD-10-CM | POA: Diagnosis not present

## 2022-07-17 DIAGNOSIS — Z7982 Long term (current) use of aspirin: Secondary | ICD-10-CM | POA: Diagnosis not present

## 2022-07-17 DIAGNOSIS — Z7902 Long term (current) use of antithrombotics/antiplatelets: Secondary | ICD-10-CM | POA: Diagnosis not present

## 2022-07-17 DIAGNOSIS — I10 Essential (primary) hypertension: Secondary | ICD-10-CM | POA: Diagnosis not present

## 2022-07-17 DIAGNOSIS — Z9181 History of falling: Secondary | ICD-10-CM | POA: Diagnosis not present

## 2022-07-17 DIAGNOSIS — Z87891 Personal history of nicotine dependence: Secondary | ICD-10-CM | POA: Diagnosis not present

## 2022-07-18 ENCOUNTER — Telehealth: Payer: Self-pay

## 2022-07-18 DIAGNOSIS — I10 Essential (primary) hypertension: Secondary | ICD-10-CM | POA: Diagnosis not present

## 2022-07-18 DIAGNOSIS — Z7982 Long term (current) use of aspirin: Secondary | ICD-10-CM | POA: Diagnosis not present

## 2022-07-18 DIAGNOSIS — S31109D Unspecified open wound of abdominal wall, unspecified quadrant without penetration into peritoneal cavity, subsequent encounter: Secondary | ICD-10-CM | POA: Diagnosis not present

## 2022-07-18 DIAGNOSIS — Z8673 Personal history of transient ischemic attack (TIA), and cerebral infarction without residual deficits: Secondary | ICD-10-CM | POA: Diagnosis not present

## 2022-07-18 DIAGNOSIS — Z89512 Acquired absence of left leg below knee: Secondary | ICD-10-CM | POA: Diagnosis not present

## 2022-07-18 DIAGNOSIS — Z9181 History of falling: Secondary | ICD-10-CM | POA: Diagnosis not present

## 2022-07-18 DIAGNOSIS — Z87891 Personal history of nicotine dependence: Secondary | ICD-10-CM | POA: Diagnosis not present

## 2022-07-18 DIAGNOSIS — Z7902 Long term (current) use of antithrombotics/antiplatelets: Secondary | ICD-10-CM | POA: Diagnosis not present

## 2022-07-18 DIAGNOSIS — E1165 Type 2 diabetes mellitus with hyperglycemia: Secondary | ICD-10-CM | POA: Diagnosis not present

## 2022-07-18 DIAGNOSIS — J432 Centrilobular emphysema: Secondary | ICD-10-CM | POA: Diagnosis not present

## 2022-07-18 MED ORDER — TIRZEPATIDE 5 MG/0.5ML ~~LOC~~ SOAJ: 5.0000 mg | SUBCUTANEOUS | 1 refills | Status: DC

## 2022-07-18 NOTE — Telephone Encounter (Signed)
Okay to refill Coreg?   The original prescription was discontinued on 10/10/2021 by Dorcas Carrow, MD for the following reason: Stop Taking at Discharge. Renewing this prescription may not be appropriate.

## 2022-07-18 NOTE — Telephone Encounter (Signed)
Left message for Danny [name] to call the office back

## 2022-07-19 DIAGNOSIS — Z87891 Personal history of nicotine dependence: Secondary | ICD-10-CM | POA: Diagnosis not present

## 2022-07-19 DIAGNOSIS — Z7902 Long term (current) use of antithrombotics/antiplatelets: Secondary | ICD-10-CM | POA: Diagnosis not present

## 2022-07-19 DIAGNOSIS — Z8673 Personal history of transient ischemic attack (TIA), and cerebral infarction without residual deficits: Secondary | ICD-10-CM | POA: Diagnosis not present

## 2022-07-19 DIAGNOSIS — J432 Centrilobular emphysema: Secondary | ICD-10-CM | POA: Diagnosis not present

## 2022-07-19 DIAGNOSIS — E1165 Type 2 diabetes mellitus with hyperglycemia: Secondary | ICD-10-CM | POA: Diagnosis not present

## 2022-07-19 DIAGNOSIS — Z7982 Long term (current) use of aspirin: Secondary | ICD-10-CM | POA: Diagnosis not present

## 2022-07-19 DIAGNOSIS — Z9181 History of falling: Secondary | ICD-10-CM | POA: Diagnosis not present

## 2022-07-19 DIAGNOSIS — S31109D Unspecified open wound of abdominal wall, unspecified quadrant without penetration into peritoneal cavity, subsequent encounter: Secondary | ICD-10-CM | POA: Diagnosis not present

## 2022-07-19 DIAGNOSIS — Z89512 Acquired absence of left leg below knee: Secondary | ICD-10-CM | POA: Diagnosis not present

## 2022-07-19 DIAGNOSIS — I10 Essential (primary) hypertension: Secondary | ICD-10-CM | POA: Diagnosis not present

## 2022-07-23 ENCOUNTER — Telehealth: Payer: Self-pay | Admitting: Family Medicine

## 2022-07-23 DIAGNOSIS — N179 Acute kidney failure, unspecified: Secondary | ICD-10-CM

## 2022-07-23 DIAGNOSIS — Z89512 Acquired absence of left leg below knee: Secondary | ICD-10-CM | POA: Diagnosis not present

## 2022-07-23 DIAGNOSIS — J432 Centrilobular emphysema: Secondary | ICD-10-CM | POA: Diagnosis not present

## 2022-07-23 DIAGNOSIS — Z9181 History of falling: Secondary | ICD-10-CM | POA: Diagnosis not present

## 2022-07-23 DIAGNOSIS — I10 Essential (primary) hypertension: Secondary | ICD-10-CM | POA: Diagnosis not present

## 2022-07-23 DIAGNOSIS — Z7902 Long term (current) use of antithrombotics/antiplatelets: Secondary | ICD-10-CM | POA: Diagnosis not present

## 2022-07-23 DIAGNOSIS — Z8673 Personal history of transient ischemic attack (TIA), and cerebral infarction without residual deficits: Secondary | ICD-10-CM | POA: Diagnosis not present

## 2022-07-23 DIAGNOSIS — S31109D Unspecified open wound of abdominal wall, unspecified quadrant without penetration into peritoneal cavity, subsequent encounter: Secondary | ICD-10-CM | POA: Diagnosis not present

## 2022-07-23 DIAGNOSIS — Z7982 Long term (current) use of aspirin: Secondary | ICD-10-CM | POA: Diagnosis not present

## 2022-07-23 DIAGNOSIS — E1165 Type 2 diabetes mellitus with hyperglycemia: Secondary | ICD-10-CM | POA: Diagnosis not present

## 2022-07-23 DIAGNOSIS — Z87891 Personal history of nicotine dependence: Secondary | ICD-10-CM | POA: Diagnosis not present

## 2022-07-23 NOTE — Telephone Encounter (Signed)
Pt need refill on sacubitril-valsartan, isosorbide mononitrate, furosemide, amLODipine and carvedilol sent to [facility] [address]

## 2022-07-24 ENCOUNTER — Other Ambulatory Visit: Payer: Self-pay

## 2022-07-24 DIAGNOSIS — I10 Essential (primary) hypertension: Secondary | ICD-10-CM | POA: Diagnosis not present

## 2022-07-24 DIAGNOSIS — Z7902 Long term (current) use of antithrombotics/antiplatelets: Secondary | ICD-10-CM | POA: Diagnosis not present

## 2022-07-24 DIAGNOSIS — Z7982 Long term (current) use of aspirin: Secondary | ICD-10-CM | POA: Diagnosis not present

## 2022-07-24 DIAGNOSIS — E1165 Type 2 diabetes mellitus with hyperglycemia: Secondary | ICD-10-CM | POA: Diagnosis not present

## 2022-07-24 DIAGNOSIS — Z9181 History of falling: Secondary | ICD-10-CM | POA: Diagnosis not present

## 2022-07-24 DIAGNOSIS — M6281 Muscle weakness (generalized): Secondary | ICD-10-CM | POA: Diagnosis not present

## 2022-07-24 DIAGNOSIS — Z89512 Acquired absence of left leg below knee: Secondary | ICD-10-CM | POA: Diagnosis not present

## 2022-07-24 DIAGNOSIS — S31109D Unspecified open wound of abdominal wall, unspecified quadrant without penetration into peritoneal cavity, subsequent encounter: Secondary | ICD-10-CM | POA: Diagnosis not present

## 2022-07-24 DIAGNOSIS — Z8673 Personal history of transient ischemic attack (TIA), and cerebral infarction without residual deficits: Secondary | ICD-10-CM | POA: Diagnosis not present

## 2022-07-24 DIAGNOSIS — J432 Centrilobular emphysema: Secondary | ICD-10-CM | POA: Diagnosis not present

## 2022-07-24 DIAGNOSIS — Z87891 Personal history of nicotine dependence: Secondary | ICD-10-CM | POA: Diagnosis not present

## 2022-07-24 DIAGNOSIS — I5032 Chronic diastolic (congestive) heart failure: Secondary | ICD-10-CM

## 2022-07-24 DIAGNOSIS — I619 Nontraumatic intracerebral hemorrhage, unspecified: Secondary | ICD-10-CM | POA: Diagnosis not present

## 2022-07-24 NOTE — Telephone Encounter (Signed)
This patient is requesting carvedilol and I am not sure of the dose, he was taken off it in the hospital can you tell me which dose he needs or if he should be on this medication, he also asked for the Sacubital-valsartan, Sorbide- mononitrate, furosemide and amlodipine as well but theses were written by other providers,  please advise.  Laelle Bridgett,cma

## 2022-07-25 NOTE — Addendum Note (Signed)
Addended by: [name] on: 07/25/2022 09:48 AM   Modules accepted: Orders

## 2022-07-25 NOTE — Telephone Encounter (Signed)
He has 2 different carvedilol doses in the chart.  Please contact the patient and see which 1 he is taking.  After his most recent hospitalization he was supposed to remain off of his Lasix, spironolactone, and Entresto.  This was due to kidney and potassium issues.  He was supposed to see cardiology within a week or 2 after discharge though it looks like that did not happen.  He needs labs done.  Orders placed.  Please see if he has already resumed the Lasix, spironolactone, and Entresto.

## 2022-07-26 DIAGNOSIS — Z7902 Long term (current) use of antithrombotics/antiplatelets: Secondary | ICD-10-CM | POA: Diagnosis not present

## 2022-07-26 DIAGNOSIS — S31109D Unspecified open wound of abdominal wall, unspecified quadrant without penetration into peritoneal cavity, subsequent encounter: Secondary | ICD-10-CM | POA: Diagnosis not present

## 2022-07-26 DIAGNOSIS — J432 Centrilobular emphysema: Secondary | ICD-10-CM | POA: Diagnosis not present

## 2022-07-26 DIAGNOSIS — Z89512 Acquired absence of left leg below knee: Secondary | ICD-10-CM | POA: Diagnosis not present

## 2022-07-26 DIAGNOSIS — Z87891 Personal history of nicotine dependence: Secondary | ICD-10-CM | POA: Diagnosis not present

## 2022-07-26 DIAGNOSIS — Z9181 History of falling: Secondary | ICD-10-CM | POA: Diagnosis not present

## 2022-07-26 DIAGNOSIS — Z8673 Personal history of transient ischemic attack (TIA), and cerebral infarction without residual deficits: Secondary | ICD-10-CM | POA: Diagnosis not present

## 2022-07-26 DIAGNOSIS — Z7982 Long term (current) use of aspirin: Secondary | ICD-10-CM | POA: Diagnosis not present

## 2022-07-26 DIAGNOSIS — I10 Essential (primary) hypertension: Secondary | ICD-10-CM | POA: Diagnosis not present

## 2022-07-26 DIAGNOSIS — E1165 Type 2 diabetes mellitus with hyperglycemia: Secondary | ICD-10-CM | POA: Diagnosis not present

## 2022-07-26 NOTE — Telephone Encounter (Signed)
I called the patient and he stated he has no carvedilol and he never did start back the lasix,spironolactone or the entresto. He is scheduled for lab. Kenny Stern,cma

## 2022-07-27 ENCOUNTER — Ambulatory Visit: Payer: Medicare Other | Admitting: Family Medicine

## 2022-07-27 NOTE — Telephone Encounter (Signed)
VM not setup.  Danny [name],cma  

## 2022-07-27 NOTE — Telephone Encounter (Signed)
Please see if the patient has been able to check blood pressures or pulses rate at home.  I would like to know what his blood pressure and pulse are before we resume carvedilol.

## 2022-07-28 ENCOUNTER — Other Ambulatory Visit: Payer: Self-pay | Admitting: Family Medicine

## 2022-07-28 DIAGNOSIS — Z8673 Personal history of transient ischemic attack (TIA), and cerebral infarction without residual deficits: Secondary | ICD-10-CM | POA: Diagnosis not present

## 2022-07-28 DIAGNOSIS — I10 Essential (primary) hypertension: Secondary | ICD-10-CM | POA: Diagnosis not present

## 2022-07-28 DIAGNOSIS — I5032 Chronic diastolic (congestive) heart failure: Secondary | ICD-10-CM

## 2022-07-28 DIAGNOSIS — Z9181 History of falling: Secondary | ICD-10-CM | POA: Diagnosis not present

## 2022-07-28 DIAGNOSIS — Z87891 Personal history of nicotine dependence: Secondary | ICD-10-CM | POA: Diagnosis not present

## 2022-07-28 DIAGNOSIS — Z89512 Acquired absence of left leg below knee: Secondary | ICD-10-CM | POA: Diagnosis not present

## 2022-07-28 DIAGNOSIS — Z7902 Long term (current) use of antithrombotics/antiplatelets: Secondary | ICD-10-CM | POA: Diagnosis not present

## 2022-07-28 DIAGNOSIS — S31109D Unspecified open wound of abdominal wall, unspecified quadrant without penetration into peritoneal cavity, subsequent encounter: Secondary | ICD-10-CM | POA: Diagnosis not present

## 2022-07-28 DIAGNOSIS — Z7982 Long term (current) use of aspirin: Secondary | ICD-10-CM | POA: Diagnosis not present

## 2022-07-28 DIAGNOSIS — E1165 Type 2 diabetes mellitus with hyperglycemia: Secondary | ICD-10-CM | POA: Diagnosis not present

## 2022-07-28 DIAGNOSIS — J432 Centrilobular emphysema: Secondary | ICD-10-CM | POA: Diagnosis not present

## 2022-07-31 ENCOUNTER — Other Ambulatory Visit (INDEPENDENT_AMBULATORY_CARE_PROVIDER_SITE_OTHER): Payer: Medicare Other

## 2022-07-31 DIAGNOSIS — N179 Acute kidney failure, unspecified: Secondary | ICD-10-CM

## 2022-07-31 LAB — BASIC METABOLIC PANEL
BUN: 29 mg/dL — ABNORMAL HIGH (ref 6–23)
CO2: 24 mEq/L (ref 19–32)
Calcium: 9.2 mg/dL (ref 8.4–10.5)
Chloride: 105 mEq/L (ref 96–112)
Creatinine, Ser: 1.2 mg/dL (ref 0.40–1.50)
GFR: 60.84 mL/min (ref >60.00)
Glucose, Bld: 203 mg/dL — ABNORMAL HIGH (ref 70–99)
Potassium: 3.6 mEq/L (ref 3.5–5.1)
Sodium: 138 mEq/L (ref 135–145)

## 2022-07-31 NOTE — Telephone Encounter (Signed)
Pt states he has been checking and the numbers are pretty good, when I asked what his numbers were he stated he can't remember. I advised the pt to keep checking BP and pulse and write them down for the next few days and to give [place] a call with a set of them to pass along to his PCP.

## 2022-07-31 NOTE — Telephone Encounter (Signed)
Pt son called wanting an update on the medication refill

## 2022-08-01 DIAGNOSIS — Z8673 Personal history of transient ischemic attack (TIA), and cerebral infarction without residual deficits: Secondary | ICD-10-CM | POA: Diagnosis not present

## 2022-08-01 DIAGNOSIS — J432 Centrilobular emphysema: Secondary | ICD-10-CM | POA: Diagnosis not present

## 2022-08-01 DIAGNOSIS — S31109D Unspecified open wound of abdominal wall, unspecified quadrant without penetration into peritoneal cavity, subsequent encounter: Secondary | ICD-10-CM | POA: Diagnosis not present

## 2022-08-01 DIAGNOSIS — E1165 Type 2 diabetes mellitus with hyperglycemia: Secondary | ICD-10-CM | POA: Diagnosis not present

## 2022-08-01 DIAGNOSIS — Z89512 Acquired absence of left leg below knee: Secondary | ICD-10-CM | POA: Diagnosis not present

## 2022-08-01 DIAGNOSIS — Z9181 History of falling: Secondary | ICD-10-CM | POA: Diagnosis not present

## 2022-08-01 DIAGNOSIS — Z7982 Long term (current) use of aspirin: Secondary | ICD-10-CM | POA: Diagnosis not present

## 2022-08-01 DIAGNOSIS — I10 Essential (primary) hypertension: Secondary | ICD-10-CM | POA: Diagnosis not present

## 2022-08-01 DIAGNOSIS — Z7902 Long term (current) use of antithrombotics/antiplatelets: Secondary | ICD-10-CM | POA: Diagnosis not present

## 2022-08-01 DIAGNOSIS — Z87891 Personal history of nicotine dependence: Secondary | ICD-10-CM | POA: Diagnosis not present

## 2022-08-01 NOTE — Addendum Note (Signed)
Addended by: [name] on: 08/01/2022 07:43 AM   Modules accepted: Orders

## 2022-08-01 NOTE — Telephone Encounter (Signed)
It looks like his carvedilol was refilled on 12/14. [facility]. The 12.5 mg dose can be sent in again if he has not picked it up yet. I really need to know what his BP numbers are to determine if he can restart his other BP/CHF medications. He can be scheduled for sooner follow-up if needed.

## 2022-08-01 NOTE — Telephone Encounter (Signed)
VM not setup.  Mort Smelser,cma  

## 2022-08-02 ENCOUNTER — Emergency Department: Payer: Medicare Other

## 2022-08-02 DIAGNOSIS — X58XXXA Exposure to other specified factors, initial encounter: Secondary | ICD-10-CM | POA: Insufficient documentation

## 2022-08-02 DIAGNOSIS — M79671 Pain in right foot: Secondary | ICD-10-CM | POA: Diagnosis not present

## 2022-08-02 DIAGNOSIS — S91201A Unspecified open wound of right great toe with damage to nail, initial encounter: Secondary | ICD-10-CM | POA: Diagnosis not present

## 2022-08-02 DIAGNOSIS — R6889 Other general symptoms and signs: Secondary | ICD-10-CM | POA: Diagnosis not present

## 2022-08-02 DIAGNOSIS — Z7902 Long term (current) use of antithrombotics/antiplatelets: Secondary | ICD-10-CM | POA: Diagnosis not present

## 2022-08-02 DIAGNOSIS — S31109D Unspecified open wound of abdominal wall, unspecified quadrant without penetration into peritoneal cavity, subsequent encounter: Secondary | ICD-10-CM | POA: Diagnosis not present

## 2022-08-02 DIAGNOSIS — R200 Anesthesia of skin: Secondary | ICD-10-CM | POA: Diagnosis not present

## 2022-08-02 DIAGNOSIS — S91202A Unspecified open wound of left great toe with damage to nail, initial encounter: Secondary | ICD-10-CM | POA: Diagnosis not present

## 2022-08-02 DIAGNOSIS — R531 Weakness: Secondary | ICD-10-CM | POA: Diagnosis not present

## 2022-08-02 DIAGNOSIS — E872 Acidosis, unspecified: Secondary | ICD-10-CM | POA: Insufficient documentation

## 2022-08-02 DIAGNOSIS — E1165 Type 2 diabetes mellitus with hyperglycemia: Secondary | ICD-10-CM | POA: Diagnosis not present

## 2022-08-02 DIAGNOSIS — J432 Centrilobular emphysema: Secondary | ICD-10-CM | POA: Diagnosis not present

## 2022-08-02 DIAGNOSIS — R58 Hemorrhage, not elsewhere classified: Secondary | ICD-10-CM | POA: Diagnosis not present

## 2022-08-02 DIAGNOSIS — Z89512 Acquired absence of left leg below knee: Secondary | ICD-10-CM | POA: Diagnosis not present

## 2022-08-02 DIAGNOSIS — Z9181 History of falling: Secondary | ICD-10-CM | POA: Diagnosis not present

## 2022-08-02 DIAGNOSIS — I10 Essential (primary) hypertension: Secondary | ICD-10-CM | POA: Diagnosis not present

## 2022-08-02 DIAGNOSIS — Z743 Need for continuous supervision: Secondary | ICD-10-CM | POA: Diagnosis not present

## 2022-08-02 DIAGNOSIS — Z8673 Personal history of transient ischemic attack (TIA), and cerebral infarction without residual deficits: Secondary | ICD-10-CM | POA: Diagnosis not present

## 2022-08-02 DIAGNOSIS — S99921A Unspecified injury of right foot, initial encounter: Secondary | ICD-10-CM | POA: Diagnosis present

## 2022-08-02 DIAGNOSIS — Z87891 Personal history of nicotine dependence: Secondary | ICD-10-CM | POA: Diagnosis not present

## 2022-08-02 DIAGNOSIS — Z7982 Long term (current) use of aspirin: Secondary | ICD-10-CM | POA: Diagnosis not present

## 2022-08-02 LAB — CBC
HCT: 49.4 % (ref 39.0–52.0)
Hemoglobin: 15.8 g/dL (ref 13.0–17.0)
MCH: 31.1 pg (ref 26.0–34.0)
MCHC: 32 g/dL (ref 30.0–36.0)
MCV: 97.2 fL (ref 80.0–100.0)
Platelets: 276 10*3/uL (ref 150–400)
RBC: 5.08 MIL/uL (ref 4.22–5.81)
RDW: 12.7 % (ref 11.5–15.5)
WBC: 7.6 10*3/uL (ref 4.0–10.5)
nRBC: 0 % (ref 0.0–0.2)

## 2022-08-02 LAB — BASIC METABOLIC PANEL
Anion gap: 10 (ref 5–15)
BUN: 37 mg/dL — ABNORMAL HIGH (ref 8–23)
CO2: 22 mmol/L (ref 22–32)
Calcium: 9.3 mg/dL (ref 8.9–10.3)
Chloride: 108 mmol/L (ref 98–111)
Creatinine, Ser: 1.15 mg/dL (ref 0.61–1.24)
GFR, Estimated: >60 mL/min (ref >60)
Glucose, Bld: 242 mg/dL — ABNORMAL HIGH (ref 70–99)
Potassium: 4.3 mmol/L (ref 3.5–5.1)
Sodium: 140 mmol/L (ref 135–145)

## 2022-08-02 LAB — LACTIC ACID, PLASMA: Lactic Acid, Venous: 2.2 mmol/L (ref 0.5–1.9)

## 2022-08-02 NOTE — ED Triage Notes (Addendum)
Pt from home BIB [facility] for right foot pain, possible infected right great toe, foot is slightly redden, reports toe is numb, no drainage, no open wound noted, obvious poor foot care, body odor noted. Foot cool to touch. Pt has BKA to left leg from complications of diabetes.

## 2022-08-02 NOTE — ED Triage Notes (Signed)
First Nurse NOte:  Arrives from [facility] via [facility].  C/O right great toe infected. Hx diabetes.  History of toe amputations to left side.  CBG:  314  163/104 79 HR 100% RA

## 2022-08-02 NOTE — Progress Notes (Signed)
Date:  08/07/2022   ID:  Danny Scrape Sr., DOB [birth year], MRN [id number]  Patient Location:  [address] [place] [address]   Provider location:   [facility], [place] office  PCP:  [name], MD  Cardiologist:  [facility]   Chief Complaint  Patient presents with   Follow up [facility]     Patient c/o shortness of breath. Medications reviewed by the patient verbally.     History of Present Illness:    Danny GROVE Sr. is a 71 y.o. male  past medical history of smoke 4 packs a day who stopped earlier in 2011,  coronary artery disease,  cardiac catheterization showing severe two-vessel disease,  severe stenosis of the proximal to mid LAD, severe ostial O1 and O2 disease, moderate to severe mid left circumflex disease that appeared aneurysmal, moderate OM 2 disease ejection fraction 40%  PCI of the LAD on January 11, 2010,  Chronic renal insufficiency,   diabetes on insulin, A1C improved hyperlipidemia who presents for routine followup of his coronary artery disease.  LOC 3/21 PV procedures 3/23 and 4/23 Has had amputation of the left leg  Rare NTG for chest pain, only used once Chronic mild shortness of breath Relatively sedentary, presents in a wheelchair  Labs reviewed A1C 7.4 CR 1.15, bun 37 Normal CBC Total chol 112, LDL 39  EKG personally reviewed by myself on todays visit Normal sinus rhythm rate 93 bpm nonspecific T wave abnormality  Other past echo history reviewed covid positive 07/2019, COVID-19 viral pneumonia  hemoglobin A1c 11.2.   pt oxygen saturation went down to 88%, CXR: hazy bilateral peripheral opacities Remdesivir and steroids.  Mother with covid, died   Past Medical History:  Diagnosis Date   CAD (coronary artery disease)    a. 01/2010 PCI of LAD; b. 09/2014 PCI/DES of mLAD due to ISR. D1 80, D2 80(jailed), LCX 55m; c. 07/2016 NSTEMI/Cath: LM nl, LAD 55m ISR, 40d, D1 90ost, 80p, D2 90ost, RI min  irregs, LCX min irregs, OM1 90 small, OM2/3 min irregs, RCA min irregs, RPLB1 90, EF 25-35%.   Chronic combined systolic and diastolic CHF (congestive heart failure) (HCC)    a. 07/2016 Echo: EF 30%, severe septal/anterior HK, Gr1 DD.   CKD (chronic kidney disease), stage III (HCC)    COPD (chronic obstructive pulmonary disease) (HCC)    DM2 (diabetes mellitus, type 2) (HCC)    Erectile dysfunction    HLD (hyperlipidemia)    HTN (hypertension)    Ischemic cardiomyopathy    a. 07/2016 Echo: EF 30% w/ sev septal/ant HK. Gr1 DD.   Past Surgical History:  Procedure Laterality Date   AMPUTATION Left 11/10/2020   Procedure: AMPUTATION BELOW KNEE;  Surgeon: Annice Needy, MD;  Location: [facility] ORS;  Service: General;  Laterality: Left;   BELOW KNEE LEG AMPUTATION     CAD: stent to the LAD     CARDIAC CATHETERIZATION  10/01/2014   CARDIAC CATHETERIZATION N/A 07/23/2016   Procedure: Left Heart Cath and Coronary Angiography;  Surgeon: Iran Ouch, MD;  Location: [facility] INVASIVE CV LAB;  Service: Cardiovascular;  Laterality: N/A;   CORONARY ANGIOPLASTY WITH STENT PLACEMENT  10/01/2014   LOWER EXTREMITY ANGIOGRAPHY Left 10/31/2020   Procedure: LOWER EXTREMITY ANGIOGRAPHY;  Surgeon: Annice Needy, MD;  Location: [facility] INVASIVE CV LAB;  Service: Cardiovascular;  Laterality: Left;   LOWER EXTREMITY ANGIOGRAPHY Right 11/20/2021   Procedure: Lower Extremity Angiography;  Surgeon: [name], MD;  Location: [place] CV LAB;  Service: Cardiovascular;  Laterality: Right;     Current Meds  Medication Sig   acetaminophen (TYLENOL) 325 MG tablet Take 2 tablets (650 mg total) by mouth every 4 (four) hours as needed for mild pain (or temp > 37.5 C (99.5 F)).   albuterol (PROVENTIL) (2.5 MG/3ML) 0.083% nebulizer solution Take 3 mLs (2.5 mg total) by nebulization every 6 (six) hours as needed for wheezing or shortness of breath.   Albuterol Sulfate (PROAIR RESPICLICK) 123XX123 (90 Base) MCG/ACT AEPB Inhale 1 puff  into the lungs every 6 (six) hours as needed.   amLODipine (NORVASC) 5 MG tablet TAKE 1 TABLET (5 MG TOTAL) BY MOUTH DAILY.   aspirin EC 81 MG tablet Take 1 tablet (81 mg total) by mouth daily.   atorvastatin (LIPITOR) 80 MG tablet Take 1 tablet (80 mg total) by mouth daily.   butalbital-acetaminophen-caffeine (FIORICET) 50-325-40 MG tablet Take 1 tablet by mouth every 8 (eight) hours as needed for headache.   carvedilol (COREG) 12.5 MG tablet TAKE 1 TABLET (12.5 MG TOTAL) BY MOUTH 2 (TWO) TIMES DAILY WITH A MEAL.   cephALEXin (KEFLEX) 500 MG capsule Take 1 capsule (500 mg total) by mouth 3 (three) times daily for 5 days.   clopidogrel (PLAVIX) 75 MG tablet Take 1 tablet (75 mg total) by mouth daily.   Continuous Blood Gluc Receiver (FREESTYLE LIBRE 2 READER) DEVI 1 Device by Does not apply route 4 (four) times daily.   Continuous Blood Gluc Sensor (FREESTYLE LIBRE 2 SENSOR) MISC USE 1 SENSOR EVERY 14 DAYS   empagliflozin (JARDIANCE) 25 MG TABS tablet Take 1 tablet (25 mg total) by mouth daily.   escitalopram (LEXAPRO) 10 MG tablet Take 1 tablet (10 mg total) by mouth daily.   fluticasone-salmeterol (ADVAIR) 250-50 MCG/ACT AEPB Inhale 1 puff into the lungs 2 (two) times daily.   furosemide (LASIX) 20 MG tablet Hold until followup with cardiology due to acute kidney injury.   gabapentin (NEURONTIN) 300 MG capsule Take 1 capsule (300 mg total) by mouth at bedtime.   HYDROcodone-acetaminophen (NORCO/VICODIN) 5-325 MG tablet Take 1 tablet by mouth every 4 (four) hours as needed for moderate pain.   Incontinence Supply Disposable (PROCARE ADULT BRIEFS X-LARGE) MISC 1 Application by Does not apply route as needed (For incontinence).   insulin lispro (HUMALOG KWIKPEN) 100 UNIT/ML KwikPen Inject 0-15 Units into the skin 4 (four) times daily -  before meals and at bedtime. CBG < 70: Implement Hypoglycemia Standing Orders and refer to Hypoglycemia Standing Orders sidebar report CBG 70 - 120: 0 units CBG 121 -  150: 2 units CBG 151 - 200: 3 units CBG 201 - 250: 5 units CBG 251 - 300: 8 units CBG 301 - 350: 11 units CBG 351 - 400: 15 units CBG > 400: call MD   Insulin Pen Needle (BD PEN NEEDLE MICRO U/F) 32G X 6 MM MISC 1 EACH BY DOES NOT APPLY ROUTE 2 (TWO) TIMES DAILY.   isosorbide mononitrate (IMDUR) 30 MG 24 hr tablet TAKE 1 TABLET BY MOUTH EVERY DAY   nitroGLYCERIN (NITROSTAT) 0.4 MG SL tablet Place 0.4 mg under the tongue every 5 (five) minutes as needed for chest pain.   nystatin ointment (MYCOSTATIN) APPLY TO AFFECTED AREA TWICE A DAY   ondansetron (ZOFRAN) 4 MG tablet Take 4 mg by mouth every 6 (six) hours as needed.   ondansetron (ZOFRAN-ODT) 4 MG disintegrating tablet Allow 1-2 tablets to  dissolve in your mouth every 8 hours as needed for nausea/vomiting   sacubitril-valsartan (ENTRESTO) 49-51 MG Hold until followup with cardiology due to acute kidney injury.   spironolactone (ALDACTONE) 25 MG tablet TAKE 1 TABLET (25 MG TOTAL) BY MOUTH DAILY.   tamsulosin (FLOMAX) 0.4 MG CAPS capsule TAKE 1 CAPSULE BY MOUTH EVERY DAY   tirzepatide (MOUNJARO) 2.5 MG/0.5ML Pen INJECT 2.5 MG INTO THE SKIN ONCE A WEEK FOR 28 DAYS.   tirzepatide (MOUNJARO) 5 MG/0.5ML Pen Inject 5 mg into the skin once a week. Start after you finish the 28 day course of the 2.5 mg dose.   TRULICITY 4.5 0000000 SOPN SMARTSIG:4.5 Milligram(s) SUB-Q Once a Week     Allergies:   Metformin and related   Social History   Tobacco Use   Smoking status: Former    Packs/day: 3.00    Years: 0.00    Total pack years: 0.00    Types: Cigarettes    Quit date: 09/07/2009    Years since quitting: 12.9   Smokeless tobacco: Never   Tobacco comments:    quit june 2011  Vaping Use   Vaping Use: Never used  Substance Use Topics   Alcohol use: Yes    Alcohol/week: 7.0 standard drinks of alcohol    Types: 6 Cans of beer, 1 Standard drinks or equivalent per week    Comment: last week 07/26/2022   Drug use: Never     Current Outpatient  Medications on File Prior to Visit  Medication Sig Dispense Refill   acetaminophen (TYLENOL) 325 MG tablet Take 2 tablets (650 mg total) by mouth every 4 (four) hours as needed for mild pain (or temp > 37.5 C (99.5 F)).     albuterol (PROVENTIL) (2.5 MG/3ML) 0.083% nebulizer solution Take 3 mLs (2.5 mg total) by nebulization every 6 (six) hours as needed for wheezing or shortness of breath. 150 mL 1   Albuterol Sulfate (PROAIR RESPICLICK) 123XX123 (90 Base) MCG/ACT AEPB Inhale 1 puff into the lungs every 6 (six) hours as needed. 1 each 12   amLODipine (NORVASC) 5 MG tablet TAKE 1 TABLET (5 MG TOTAL) BY MOUTH DAILY. 30 tablet 0   aspirin EC 81 MG tablet Take 1 tablet (81 mg total) by mouth daily. 150 tablet 2   atorvastatin (LIPITOR) 80 MG tablet Take 1 tablet (80 mg total) by mouth daily. 90 tablet 3   butalbital-acetaminophen-caffeine (FIORICET) 50-325-40 MG tablet Take 1 tablet by mouth every 8 (eight) hours as needed for headache. 14 tablet 0   carvedilol (COREG) 12.5 MG tablet TAKE 1 TABLET (12.5 MG TOTAL) BY MOUTH 2 (TWO) TIMES DAILY WITH A MEAL. 180 tablet 3   cephALEXin (KEFLEX) 500 MG capsule Take 1 capsule (500 mg total) by mouth 3 (three) times daily for 5 days. 15 capsule 0   clopidogrel (PLAVIX) 75 MG tablet Take 1 tablet (75 mg total) by mouth daily. 30 tablet 11   Continuous Blood Gluc Receiver (FREESTYLE LIBRE 2 READER) DEVI 1 Device by Does not apply route 4 (four) times daily. 1 each 0   Continuous Blood Gluc Sensor (FREESTYLE LIBRE 2 SENSOR) MISC USE 1 SENSOR EVERY 14 DAYS 6 each 0   empagliflozin (JARDIANCE) 25 MG TABS tablet Take 1 tablet (25 mg total) by mouth daily. 90 tablet 3   escitalopram (LEXAPRO) 10 MG tablet Take 1 tablet (10 mg total) by mouth daily. 90 tablet 1   fluticasone-salmeterol (ADVAIR) 250-50 MCG/ACT AEPB Inhale 1 puff into the lungs  2 (two) times daily. 3 each 1   furosemide (LASIX) 20 MG tablet Hold until followup with cardiology due to acute kidney injury. 45  tablet 3   gabapentin (NEURONTIN) 300 MG capsule Take 1 capsule (300 mg total) by mouth at bedtime. 30 capsule 3   HYDROcodone-acetaminophen (NORCO/VICODIN) 5-325 MG tablet Take 1 tablet by mouth every 4 (four) hours as needed for moderate pain. 20 tablet 0   Incontinence Supply Disposable (PROCARE ADULT BRIEFS X-LARGE) MISC 1 Application by Does not apply route as needed (For incontinence). 50 each 3   insulin lispro (HUMALOG KWIKPEN) 100 UNIT/ML KwikPen Inject 0-15 Units into the skin 4 (four) times daily -  before meals and at bedtime. CBG < 70: Implement Hypoglycemia Standing Orders and refer to Hypoglycemia Standing Orders sidebar report CBG 70 - 120: 0 units CBG 121 - 150: 2 units CBG 151 - 200: 3 units CBG 201 - 250: 5 units CBG 251 - 300: 8 units CBG 301 - 350: 11 units CBG 351 - 400: 15 units CBG > 400: call MD 15 mL 11   Insulin Pen Needle (BD PEN NEEDLE MICRO U/F) 32G X 6 MM MISC 1 EACH BY DOES NOT APPLY ROUTE 2 (TWO) TIMES DAILY. 100 each 11   isosorbide mononitrate (IMDUR) 30 MG 24 hr tablet TAKE 1 TABLET BY MOUTH EVERY DAY 90 tablet 3   nitroGLYCERIN (NITROSTAT) 0.4 MG SL tablet Place 0.4 mg under the tongue every 5 (five) minutes as needed for chest pain.     nystatin ointment (MYCOSTATIN) APPLY TO AFFECTED AREA TWICE A DAY 30 g 0   ondansetron (ZOFRAN) 4 MG tablet Take 4 mg by mouth every 6 (six) hours as needed.     ondansetron (ZOFRAN-ODT) 4 MG disintegrating tablet Allow 1-2 tablets to dissolve in your mouth every 8 hours as needed for nausea/vomiting 30 tablet 0   sacubitril-valsartan (ENTRESTO) 49-51 MG Hold until followup with cardiology due to acute kidney injury. 180 tablet 1   spironolactone (ALDACTONE) 25 MG tablet TAKE 1 TABLET (25 MG TOTAL) BY MOUTH DAILY. 90 tablet 1   tamsulosin (FLOMAX) 0.4 MG CAPS capsule TAKE 1 CAPSULE BY MOUTH EVERY DAY 90 capsule 1   tirzepatide (MOUNJARO) 2.5 MG/0.5ML Pen INJECT 2.5 MG INTO THE SKIN ONCE A WEEK FOR 28 DAYS. 2 mL 0   tirzepatide  (MOUNJARO) 5 MG/0.5ML Pen Inject 5 mg into the skin once a week. Start after you finish the 28 day course of the 2.5 mg dose. 6 mL 1   TRULICITY 4.5 MG/0.5ML SOPN SMARTSIG:4.5 Milligram(s) SUB-Q Once a Week     No current facility-administered medications on file prior to visit.     Family Hx: The patient's family history includes Cancer in his brother; Heart attack in his father.  ROS:   Please see the history of present illness.    Review of Systems  Constitutional: Negative.   HENT: Negative.    Respiratory:  Positive for shortness of breath.   Cardiovascular: Negative.   Gastrointestinal: Negative.   Musculoskeletal:  Positive for back pain and joint pain.  Neurological: Negative.   Psychiatric/Behavioral: Negative.    All other systems reviewed and are negative.     Labs/Other Tests and Data Reviewed:    Recent Labs: 06/04/2022: ALT 33 06/30/2022: B Natriuretic Peptide 42.6 07/01/2022: Magnesium 2.4 08/02/2022: BUN 37; Creatinine, Ser 1.15; Hemoglobin 15.8; Platelets 276; Potassium 4.3; Sodium 140   Recent Lipid Panel Lab Results  Component Value Date/Time  CHOL 112 10/03/2021 10:18 AM   TRIG 147 10/03/2021 10:18 AM   HDL 44 10/03/2021 10:18 AM   CHOLHDL 2.5 10/03/2021 10:18 AM   LDLCALC 39 10/03/2021 10:18 AM   LDLDIRECT 64.0 03/20/2018 09:50 AM    Wt Readings from Last 3 Encounters:  08/02/22 236 lb (107 kg)  07/01/22 238 lb (108 kg)  05/31/22 235 lb 14.3 oz (107 kg)     Exam:    Vital Signs: Vital signs may also be detailed in the HPI BP 120/70 (BP Location: Left Arm, Patient Position: Sitting, Cuff Size: Normal)   Pulse 95   Ht 6' (1.829 m)   SpO2 93%   BMI 32.01 kg/m  Constitutional:  oriented to person, place, and time. No distress.  In a wheelchair HENT:  Head: Grossly normal Eyes:  no discharge. No scleral icterus.  Neck: No JVD, no carotid bruits  Cardiovascular: Regular rate and rhythm, no murmurs appreciated Pulmonary/Chest: Clear to  auscultation bilaterally, no wheezes or rails Abdominal: Soft.  no distension.  no tenderness.  Musculoskeletal: Normal range of motion Neurological:  normal muscle tone. Coordination normal. No atrophy Skin: Skin warm and dry Psychiatric: normal affect, pleasant   ASSESSMENT & PLAN:    Chronic combined systolic and diastolic CHF (congestive heart failure) (HCC) Appears relatively euvolemic on today's visit Prior ejection fraction May 2000 2235 to 40% Feels his breathing is stable On carvedilol, Jardiance, Imdur, Lasix He prefers no medication changes  Coronary artery disease of native artery of native heart with stable angina pectoris (HCC) Currently with no symptoms of angina. No further workup at this time. Continue current medication regimen. Diabetes with much better control  Centrilobular emphysema ([place]) Stop smoking many years ago  Essential hypertension Blood pressure is well controlled on today's visit. No changes made to the medications.  Diabetes mellitus type 2, uncontrolled, with complications (HCC) 123456 much improved, discussed with him  CKD (chronic kidney disease) stage 3, GFR 30-59 ml/min (HCC) Stable renal function, much improved from November 2023  Pure hypercholesterolemia Cholesterol at goal   Total encounter time more than 30 minutes  Greater than 50% was spent in counseling and coordination of care with the patient   Signed, Ida Rogue, MD  08/07/2022 3:16 PM    [facility] [address], [place], [place] [address]

## 2022-08-02 NOTE — Telephone Encounter (Signed)
I called the patient and informed him that he needed to take his BP  for a few days and send it to the provider through mychart and he understood, he stated his BP is running kinda low.  He will send the readings.  Khoen Genet,cma

## 2022-08-03 ENCOUNTER — Emergency Department
Admission: EM | Admit: 2022-08-03 | Discharge: 2022-08-03 | Disposition: A | Discharge status: Home / Self Care | Payer: Medicare Other | Attending: Emergency Medicine | Admitting: Emergency Medicine

## 2022-08-03 DIAGNOSIS — R69 Illness, unspecified: Secondary | ICD-10-CM | POA: Diagnosis not present

## 2022-08-03 DIAGNOSIS — S91209A Unspecified open wound of unspecified toe(s) with damage to nail, initial encounter: Secondary | ICD-10-CM

## 2022-08-03 DIAGNOSIS — S91201A Unspecified open wound of right great toe with damage to nail, initial encounter: Secondary | ICD-10-CM | POA: Diagnosis not present

## 2022-08-03 DIAGNOSIS — Z743 Need for continuous supervision: Secondary | ICD-10-CM | POA: Diagnosis not present

## 2022-08-03 LAB — LACTIC ACID, PLASMA: Lactic Acid, Venous: 1 mmol/L (ref 0.5–1.9)

## 2022-08-03 MED ORDER — LACTATED RINGERS IV BOLUS
1000.0000 mL | Freq: Once | INTRAVENOUS | Status: AC
  Administered 2022-08-03: 1000 mL via INTRAVENOUS

## 2022-08-03 MED ORDER — CEPHALEXIN 500 MG PO CAPS
500.0000 mg | ORAL_CAPSULE | Freq: Once | ORAL | Status: AC
  Administered 2022-08-03: 500 mg via ORAL

## 2022-08-03 MED ORDER — CEPHALEXIN 500 MG PO CAPS: 500.0000 mg | ORAL_CAPSULE | Freq: Three times a day (TID) | ORAL | 0 refills | Status: AC

## 2022-08-03 NOTE — Discharge Instructions (Addendum)
You have no wound or infection of your toe right now, but it appears that the nail was so long that it partially ripped away from the nailbed and cause the bleeding.  You can soak it in warm water or warm water with Epsom salts.  We gave you a short course of antibiotics to try to prevent infection; although there is low risk, since you have had problems with infections in the past, it is reasonable to try and avoid additional infection.  We strongly encourage you to call the office of Dr. Alberteen Spindle and schedule the next available follow-up appointment with the podiatry team.  You may also follow-up with your regular doctor.

## 2022-08-03 NOTE — ED Notes (Signed)
2nd lactic  Fluids

## 2022-08-03 NOTE — ED Provider Notes (Signed)
[facility] Provider Note    Event Date/Time   First MD Initiated Contact with Patient 08/03/22 0024     (approximate)   History   Diabetic Foot Issues   HPI  LEMONTE AL Sr. is a 71 y.o. male who presents for evaluation of an injury to his left great toe.  He initially said that it was painful and he was worried that it had a wound.  However he admitted to me that he was concerned because there was some blood around the nail and he was afraid that there was a problem.  His home health nurse told him that nothing was wrong, but he wanted it checked out.  He had problems with his other foot and ended up getting a below-knee amputation of the left leg.  He has previously seen Dr. Alberteen Spindle with podiatry.  He said that he cannot bend over to trim his toenails and he has not been to see the podiatrist in quite some time, since his last amputation which he thinks was more than a year ago.  He has no pain in his toe right now, he just noticed some dried blood.  He has no other pain or discomfort.  He denies shortness of breath, nausea, vomiting, and abdominal pain.  No recent chest pain.  No sore throat, fever/chills, nasal congestion, cough.     Physical Exam   Triage Vital Signs: ED Triage Vitals  Enc Vitals Group     BP 08/02/22 1954 (!) 174/110     Pulse Rate 08/02/22 1954 88     Resp 08/02/22 1954 17     Temp 08/02/22 1954 98 F (36.7 C)     Temp Source 08/02/22 1954 Oral     SpO2 08/02/22 1954 94 %     Weight 08/02/22 1956 107 kg (236 lb)     Height 08/02/22 1956 1.829 m (6')     Head Circumference --      Peak Flow --      Pain Score 08/02/22 1956 0     Pain Loc --      Pain Edu? --      Excl. in GC? --     Most recent vital signs: Vitals:   08/02/22 1954 08/03/22 0200  BP: (!) 174/110 (!) 176/103  Pulse: 88 76  Resp: 17 19  Temp: 98 F (36.7 C) 97.8 F (36.6 C)  SpO2: 94% 96%     General: Awake, no distress.  Disheveled but seems to  be at baseline. CV:  Good peripheral perfusion.  Regular rate and rhythm. Resp:  Normal effort.  Lungs are clear to auscultation.  No accessory muscle usage. Abd:  No distention.  Other:  Status post left-sided BKA.  Right great toes are all normal in appearance with no plantar wounds, no pain or tenderness to palpation.  He has dried blood all around the nailbed of the right toe and some has dripped down between the toes but there is no obvious source of blood.  The nail is poorly maintained and is very long.  It feels slightly unstable but not tremendously so.   ED Results / Procedures / Treatments   Labs (all labs ordered are listed, but only abnormal results are displayed) Labs Reviewed  BASIC METABOLIC PANEL - Abnormal; Notable for the following components:      Result Value   Glucose, Bld 242 (*)    BUN 37 (*)    All other  components within normal limits  LACTIC ACID, PLASMA - Abnormal; Notable for the following components:   Lactic Acid, Venous 2.2 (*)    All other components within normal limits  CBC  LACTIC ACID, PLASMA     RADIOLOGY I viewed and interpreted the patient's right great toe x-ray series.  No fracture, dislocation, nor obvious soft tissue abnormality.  Normal report as per radiologist.    PROCEDURES:  Critical Care performed: No  Procedures   MEDICATIONS ORDERED IN ED: Medications  lactated ringers bolus 1,000 mL (0 mLs Intravenous Stopped 08/03/22 0354)  cephALEXin (KEFLEX) capsule 500 mg (500 mg Oral Given 08/03/22 0423)     IMPRESSION / MDM / ASSESSMENT AND PLAN / ED COURSE  I reviewed the triage vital signs and the nursing notes.                              Differential diagnosis includes, but is not limited to, nail injury/avulsion, cellulitis, diabetic foot wound, osteomyelitis.  Patient's presentation is most consistent with acute presentation with potential threat to life or bodily function.  Given this patient's history including  prior amputations as result of poor vasculature and extremity infections, it was very reasonable and appropriate to evaluate the patient broadly for evidence of potentially life-threatening infection.  Lab/studies ordered: BMP, CBC, lactic acid, x-rays of the right great toe.  The patient's lactic acid initially was 2.2, which I think is likely secondary to volume depletion and not representative of acute infection or sepsis.  The patient admitted after I talked with him that he was concerned about the blood but is actually having no pain or swelling.  His nail is long and I believe that it got caught on a sock and partially avulsed away from the nail bed, but it is in no way infected.  I ordered LR 1 L IV bolus and will recheck a lactic acid.  Assuming that the lactic acid normalizes, which I believe it well, he can be discharged for outpatient follow-up with podiatry.  Given his history, I will likely start him on some antibiotics given that he does have some sort of wound on the toe, but it does not require emergent intervention tonight.  He agrees with the current plan.    Clinical Course as of 08/03/22 0825  Fri Aug 03, 2022  0247 Lactic Acid, Venous: 1.0 [CF]  0247 Patient is comfortable, lactic acid has come down to 1.0, he is in no distress.  I am discharging him for outpatient follow-up with a prescription for Keflex as described above. [CF]    Clinical Course User Index [CF] Loleta Rose, MD     FINAL CLINICAL IMPRESSION(S) / ED DIAGNOSES   Final diagnoses:  Toenail avulsion, initial encounter     Rx / DC Orders   ED Discharge Orders          Ordered    cephALEXin (KEFLEX) 500 MG capsule  3 times daily        08/03/22 0249             Note:  This document was prepared using Dragon voice recognition software and may include unintentional dictation errors.   Loleta Rose, MD 08/03/22 [phone]

## 2022-08-06 DIAGNOSIS — I10 Essential (primary) hypertension: Secondary | ICD-10-CM | POA: Diagnosis not present

## 2022-08-06 DIAGNOSIS — Z7982 Long term (current) use of aspirin: Secondary | ICD-10-CM | POA: Diagnosis not present

## 2022-08-06 DIAGNOSIS — Z8673 Personal history of transient ischemic attack (TIA), and cerebral infarction without residual deficits: Secondary | ICD-10-CM | POA: Diagnosis not present

## 2022-08-06 DIAGNOSIS — S31109D Unspecified open wound of abdominal wall, unspecified quadrant without penetration into peritoneal cavity, subsequent encounter: Secondary | ICD-10-CM | POA: Diagnosis not present

## 2022-08-06 DIAGNOSIS — J432 Centrilobular emphysema: Secondary | ICD-10-CM | POA: Diagnosis not present

## 2022-08-06 DIAGNOSIS — Z7902 Long term (current) use of antithrombotics/antiplatelets: Secondary | ICD-10-CM | POA: Diagnosis not present

## 2022-08-06 DIAGNOSIS — Z89512 Acquired absence of left leg below knee: Secondary | ICD-10-CM | POA: Diagnosis not present

## 2022-08-06 DIAGNOSIS — Z9181 History of falling: Secondary | ICD-10-CM | POA: Diagnosis not present

## 2022-08-06 DIAGNOSIS — Z87891 Personal history of nicotine dependence: Secondary | ICD-10-CM | POA: Diagnosis not present

## 2022-08-06 DIAGNOSIS — E1165 Type 2 diabetes mellitus with hyperglycemia: Secondary | ICD-10-CM | POA: Diagnosis not present

## 2022-08-07 ENCOUNTER — Ambulatory Visit: Payer: Medicare Other | Attending: Cardiovascular Disease | Admitting: Cardiovascular Disease

## 2022-08-07 ENCOUNTER — Encounter: Payer: Self-pay | Admitting: Cardiovascular Disease

## 2022-08-07 VITALS — BP 120/70 | HR 95 | Ht 72.0 in

## 2022-08-07 DIAGNOSIS — E785 Hyperlipidemia, unspecified: Secondary | ICD-10-CM

## 2022-08-07 DIAGNOSIS — I5042 Chronic combined systolic (congestive) and diastolic (congestive) heart failure: Secondary | ICD-10-CM | POA: Diagnosis not present

## 2022-08-07 DIAGNOSIS — L97909 Non-pressure chronic ulcer of unspecified part of unspecified lower leg with unspecified severity: Secondary | ICD-10-CM

## 2022-08-07 DIAGNOSIS — I70299 Other atherosclerosis of native arteries of extremities, unspecified extremity: Secondary | ICD-10-CM | POA: Diagnosis not present

## 2022-08-07 DIAGNOSIS — N183 Chronic kidney disease, stage 3 unspecified: Secondary | ICD-10-CM

## 2022-08-07 DIAGNOSIS — I25118 Atherosclerotic heart disease of native coronary artery with other forms of angina pectoris: Secondary | ICD-10-CM

## 2022-08-07 DIAGNOSIS — E1169 Type 2 diabetes mellitus with other specified complication: Secondary | ICD-10-CM | POA: Diagnosis not present

## 2022-08-07 DIAGNOSIS — J432 Centrilobular emphysema: Secondary | ICD-10-CM

## 2022-08-07 DIAGNOSIS — I255 Ischemic cardiomyopathy: Secondary | ICD-10-CM | POA: Diagnosis not present

## 2022-08-07 DIAGNOSIS — I739 Peripheral vascular disease, unspecified: Secondary | ICD-10-CM

## 2022-08-07 DIAGNOSIS — R0600 Dyspnea, unspecified: Secondary | ICD-10-CM | POA: Diagnosis not present

## 2022-08-07 DIAGNOSIS — I10 Essential (primary) hypertension: Secondary | ICD-10-CM | POA: Diagnosis not present

## 2022-08-07 DIAGNOSIS — E782 Mixed hyperlipidemia: Secondary | ICD-10-CM

## 2022-08-07 DIAGNOSIS — I779 Disorder of arteries and arterioles, unspecified: Secondary | ICD-10-CM | POA: Diagnosis not present

## 2022-08-07 MED ORDER — AMLODIPINE BESYLATE 5 MG PO TABS: 5.0000 mg | ORAL_TABLET | Freq: Every day | ORAL | 3 refills | Status: DC

## 2022-08-07 NOTE — Patient Instructions (Signed)
Medication Instructions:  No changes  If you need a refill on your cardiac medications before your next appointment, please call your pharmacy.   Lab work: No new labs needed  Testing/Procedures: No new testing needed  Follow-Up: At [facility], you and your health needs are our priority.  As part of our continuing mission to provide you with exceptional heart care, we have created designated Provider Care Teams.  These Care Teams include your primary Cardiologist (physician) and Advanced Practice Providers (APPs -  Physician Assistants and Nurse Practitioners) who all work together to provide you with the care you need, when you need it.  You will need a follow up appointment in 12 months  Providers on your designated Care Team:   Christopher Berge, NP Ryan Dunn, PA-C Cadence Furth, PA-C  COVID-19 Vaccine Information can be found at: [email or web address] For questions related to vaccine distribution or appointments, please email [email or web address] or call [phone].   

## 2022-08-09 DIAGNOSIS — Z8673 Personal history of transient ischemic attack (TIA), and cerebral infarction without residual deficits: Secondary | ICD-10-CM | POA: Diagnosis not present

## 2022-08-09 DIAGNOSIS — J432 Centrilobular emphysema: Secondary | ICD-10-CM | POA: Diagnosis not present

## 2022-08-09 DIAGNOSIS — Z9181 History of falling: Secondary | ICD-10-CM | POA: Diagnosis not present

## 2022-08-09 DIAGNOSIS — S31109D Unspecified open wound of abdominal wall, unspecified quadrant without penetration into peritoneal cavity, subsequent encounter: Secondary | ICD-10-CM | POA: Diagnosis not present

## 2022-08-09 DIAGNOSIS — Z87891 Personal history of nicotine dependence: Secondary | ICD-10-CM | POA: Diagnosis not present

## 2022-08-09 DIAGNOSIS — E1165 Type 2 diabetes mellitus with hyperglycemia: Secondary | ICD-10-CM | POA: Diagnosis not present

## 2022-08-09 DIAGNOSIS — Z7902 Long term (current) use of antithrombotics/antiplatelets: Secondary | ICD-10-CM | POA: Diagnosis not present

## 2022-08-09 DIAGNOSIS — I10 Essential (primary) hypertension: Secondary | ICD-10-CM | POA: Diagnosis not present

## 2022-08-09 DIAGNOSIS — Z7982 Long term (current) use of aspirin: Secondary | ICD-10-CM | POA: Diagnosis not present

## 2022-08-09 DIAGNOSIS — Z89512 Acquired absence of left leg below knee: Secondary | ICD-10-CM | POA: Diagnosis not present

## 2022-08-10 DIAGNOSIS — Z8673 Personal history of transient ischemic attack (TIA), and cerebral infarction without residual deficits: Secondary | ICD-10-CM | POA: Diagnosis not present

## 2022-08-10 DIAGNOSIS — Z7982 Long term (current) use of aspirin: Secondary | ICD-10-CM | POA: Diagnosis not present

## 2022-08-10 DIAGNOSIS — E1165 Type 2 diabetes mellitus with hyperglycemia: Secondary | ICD-10-CM | POA: Diagnosis not present

## 2022-08-10 DIAGNOSIS — Z7902 Long term (current) use of antithrombotics/antiplatelets: Secondary | ICD-10-CM | POA: Diagnosis not present

## 2022-08-10 DIAGNOSIS — Z9181 History of falling: Secondary | ICD-10-CM | POA: Diagnosis not present

## 2022-08-10 DIAGNOSIS — I10 Essential (primary) hypertension: Secondary | ICD-10-CM | POA: Diagnosis not present

## 2022-08-10 DIAGNOSIS — Z87891 Personal history of nicotine dependence: Secondary | ICD-10-CM | POA: Diagnosis not present

## 2022-08-10 DIAGNOSIS — S31109D Unspecified open wound of abdominal wall, unspecified quadrant without penetration into peritoneal cavity, subsequent encounter: Secondary | ICD-10-CM | POA: Diagnosis not present

## 2022-08-10 DIAGNOSIS — Z89512 Acquired absence of left leg below knee: Secondary | ICD-10-CM | POA: Diagnosis not present

## 2022-08-10 DIAGNOSIS — J432 Centrilobular emphysema: Secondary | ICD-10-CM | POA: Diagnosis not present

## 2022-08-13 ENCOUNTER — Other Ambulatory Visit: Payer: Self-pay | Admitting: Family Medicine

## 2022-08-13 DIAGNOSIS — E1165 Type 2 diabetes mellitus with hyperglycemia: Secondary | ICD-10-CM | POA: Diagnosis not present

## 2022-08-13 DIAGNOSIS — E1169 Type 2 diabetes mellitus with other specified complication: Secondary | ICD-10-CM

## 2022-08-13 DIAGNOSIS — Z8673 Personal history of transient ischemic attack (TIA), and cerebral infarction without residual deficits: Secondary | ICD-10-CM | POA: Diagnosis not present

## 2022-08-13 DIAGNOSIS — I10 Essential (primary) hypertension: Secondary | ICD-10-CM | POA: Diagnosis not present

## 2022-08-13 DIAGNOSIS — Z7982 Long term (current) use of aspirin: Secondary | ICD-10-CM | POA: Diagnosis not present

## 2022-08-13 DIAGNOSIS — J432 Centrilobular emphysema: Secondary | ICD-10-CM | POA: Diagnosis not present

## 2022-08-13 DIAGNOSIS — Z9181 History of falling: Secondary | ICD-10-CM | POA: Diagnosis not present

## 2022-08-13 DIAGNOSIS — Z7902 Long term (current) use of antithrombotics/antiplatelets: Secondary | ICD-10-CM | POA: Diagnosis not present

## 2022-08-13 DIAGNOSIS — Z89512 Acquired absence of left leg below knee: Secondary | ICD-10-CM | POA: Diagnosis not present

## 2022-08-13 DIAGNOSIS — S31109D Unspecified open wound of abdominal wall, unspecified quadrant without penetration into peritoneal cavity, subsequent encounter: Secondary | ICD-10-CM | POA: Diagnosis not present

## 2022-08-13 DIAGNOSIS — Z87891 Personal history of nicotine dependence: Secondary | ICD-10-CM | POA: Diagnosis not present

## 2022-08-14 DIAGNOSIS — E1165 Type 2 diabetes mellitus with hyperglycemia: Secondary | ICD-10-CM | POA: Diagnosis not present

## 2022-08-14 DIAGNOSIS — J432 Centrilobular emphysema: Secondary | ICD-10-CM | POA: Diagnosis not present

## 2022-08-14 DIAGNOSIS — S31109D Unspecified open wound of abdominal wall, unspecified quadrant without penetration into peritoneal cavity, subsequent encounter: Secondary | ICD-10-CM | POA: Diagnosis not present

## 2022-08-14 DIAGNOSIS — I10 Essential (primary) hypertension: Secondary | ICD-10-CM | POA: Diagnosis not present

## 2022-08-14 DIAGNOSIS — Z7902 Long term (current) use of antithrombotics/antiplatelets: Secondary | ICD-10-CM | POA: Diagnosis not present

## 2022-08-14 DIAGNOSIS — Z9181 History of falling: Secondary | ICD-10-CM | POA: Diagnosis not present

## 2022-08-14 DIAGNOSIS — Z7982 Long term (current) use of aspirin: Secondary | ICD-10-CM | POA: Diagnosis not present

## 2022-08-14 DIAGNOSIS — Z89512 Acquired absence of left leg below knee: Secondary | ICD-10-CM | POA: Diagnosis not present

## 2022-08-14 DIAGNOSIS — Z87891 Personal history of nicotine dependence: Secondary | ICD-10-CM | POA: Diagnosis not present

## 2022-08-14 DIAGNOSIS — Z8673 Personal history of transient ischemic attack (TIA), and cerebral infarction without residual deficits: Secondary | ICD-10-CM | POA: Diagnosis not present

## 2022-08-15 DIAGNOSIS — E1165 Type 2 diabetes mellitus with hyperglycemia: Secondary | ICD-10-CM | POA: Diagnosis not present

## 2022-08-15 DIAGNOSIS — Z8673 Personal history of transient ischemic attack (TIA), and cerebral infarction without residual deficits: Secondary | ICD-10-CM | POA: Diagnosis not present

## 2022-08-15 DIAGNOSIS — Z89512 Acquired absence of left leg below knee: Secondary | ICD-10-CM | POA: Diagnosis not present

## 2022-08-15 DIAGNOSIS — Z87891 Personal history of nicotine dependence: Secondary | ICD-10-CM | POA: Diagnosis not present

## 2022-08-15 DIAGNOSIS — Z9181 History of falling: Secondary | ICD-10-CM | POA: Diagnosis not present

## 2022-08-15 DIAGNOSIS — J432 Centrilobular emphysema: Secondary | ICD-10-CM | POA: Diagnosis not present

## 2022-08-15 DIAGNOSIS — I10 Essential (primary) hypertension: Secondary | ICD-10-CM | POA: Diagnosis not present

## 2022-08-15 DIAGNOSIS — S31109D Unspecified open wound of abdominal wall, unspecified quadrant without penetration into peritoneal cavity, subsequent encounter: Secondary | ICD-10-CM | POA: Diagnosis not present

## 2022-08-15 DIAGNOSIS — Z7982 Long term (current) use of aspirin: Secondary | ICD-10-CM | POA: Diagnosis not present

## 2022-08-15 DIAGNOSIS — Z7902 Long term (current) use of antithrombotics/antiplatelets: Secondary | ICD-10-CM | POA: Diagnosis not present

## 2022-08-16 DIAGNOSIS — B351 Tinea unguium: Secondary | ICD-10-CM | POA: Diagnosis not present

## 2022-08-16 DIAGNOSIS — Z89512 Acquired absence of left leg below knee: Secondary | ICD-10-CM | POA: Diagnosis not present

## 2022-08-16 DIAGNOSIS — L600 Ingrowing nail: Secondary | ICD-10-CM | POA: Diagnosis not present

## 2022-08-16 DIAGNOSIS — I739 Peripheral vascular disease, unspecified: Secondary | ICD-10-CM | POA: Diagnosis not present

## 2022-08-16 DIAGNOSIS — E1140 Type 2 diabetes mellitus with diabetic neuropathy, unspecified: Secondary | ICD-10-CM | POA: Diagnosis not present

## 2022-08-16 DIAGNOSIS — Z794 Long term (current) use of insulin: Secondary | ICD-10-CM | POA: Diagnosis not present

## 2022-08-17 DIAGNOSIS — J449 Chronic obstructive pulmonary disease, unspecified: Secondary | ICD-10-CM | POA: Diagnosis not present

## 2022-08-21 DIAGNOSIS — Z7982 Long term (current) use of aspirin: Secondary | ICD-10-CM | POA: Diagnosis not present

## 2022-08-21 DIAGNOSIS — I10 Essential (primary) hypertension: Secondary | ICD-10-CM | POA: Diagnosis not present

## 2022-08-21 DIAGNOSIS — Z7902 Long term (current) use of antithrombotics/antiplatelets: Secondary | ICD-10-CM | POA: Diagnosis not present

## 2022-08-21 DIAGNOSIS — S31109D Unspecified open wound of abdominal wall, unspecified quadrant without penetration into peritoneal cavity, subsequent encounter: Secondary | ICD-10-CM | POA: Diagnosis not present

## 2022-08-21 DIAGNOSIS — Z87891 Personal history of nicotine dependence: Secondary | ICD-10-CM | POA: Diagnosis not present

## 2022-08-21 DIAGNOSIS — Z9181 History of falling: Secondary | ICD-10-CM | POA: Diagnosis not present

## 2022-08-21 DIAGNOSIS — J432 Centrilobular emphysema: Secondary | ICD-10-CM | POA: Diagnosis not present

## 2022-08-21 DIAGNOSIS — Z89512 Acquired absence of left leg below knee: Secondary | ICD-10-CM | POA: Diagnosis not present

## 2022-08-21 DIAGNOSIS — Z8673 Personal history of transient ischemic attack (TIA), and cerebral infarction without residual deficits: Secondary | ICD-10-CM | POA: Diagnosis not present

## 2022-08-21 DIAGNOSIS — E1165 Type 2 diabetes mellitus with hyperglycemia: Secondary | ICD-10-CM | POA: Diagnosis not present

## 2022-08-24 ENCOUNTER — Telehealth: Payer: Self-pay | Admitting: Family Medicine

## 2022-08-24 DIAGNOSIS — I619 Nontraumatic intracerebral hemorrhage, unspecified: Secondary | ICD-10-CM | POA: Diagnosis not present

## 2022-08-24 DIAGNOSIS — M6281 Muscle weakness (generalized): Secondary | ICD-10-CM | POA: Diagnosis not present

## 2022-08-24 NOTE — Telephone Encounter (Signed)
Attempted to call Patient-no answer or voicemail 

## 2022-08-24 NOTE — Telephone Encounter (Signed)
Anelisa from [facility] called stating she was suppose to do a nursing discharge on the pt but the pt was not home doing scheduled time

## 2022-08-27 ENCOUNTER — Ambulatory Visit: Payer: Medicare Other | Admitting: Family Medicine

## 2022-08-30 DIAGNOSIS — E1165 Type 2 diabetes mellitus with hyperglycemia: Secondary | ICD-10-CM | POA: Diagnosis not present

## 2022-08-30 DIAGNOSIS — Z8673 Personal history of transient ischemic attack (TIA), and cerebral infarction without residual deficits: Secondary | ICD-10-CM | POA: Diagnosis not present

## 2022-08-30 DIAGNOSIS — Z87891 Personal history of nicotine dependence: Secondary | ICD-10-CM | POA: Diagnosis not present

## 2022-08-30 DIAGNOSIS — Z9181 History of falling: Secondary | ICD-10-CM | POA: Diagnosis not present

## 2022-08-30 DIAGNOSIS — Z7982 Long term (current) use of aspirin: Secondary | ICD-10-CM | POA: Diagnosis not present

## 2022-08-30 DIAGNOSIS — Z89512 Acquired absence of left leg below knee: Secondary | ICD-10-CM | POA: Diagnosis not present

## 2022-08-30 DIAGNOSIS — J432 Centrilobular emphysema: Secondary | ICD-10-CM | POA: Diagnosis not present

## 2022-08-30 DIAGNOSIS — Z7902 Long term (current) use of antithrombotics/antiplatelets: Secondary | ICD-10-CM | POA: Diagnosis not present

## 2022-08-30 DIAGNOSIS — S31109D Unspecified open wound of abdominal wall, unspecified quadrant without penetration into peritoneal cavity, subsequent encounter: Secondary | ICD-10-CM | POA: Diagnosis not present

## 2022-08-30 DIAGNOSIS — I10 Essential (primary) hypertension: Secondary | ICD-10-CM | POA: Diagnosis not present

## 2022-09-04 DIAGNOSIS — Z87891 Personal history of nicotine dependence: Secondary | ICD-10-CM | POA: Diagnosis not present

## 2022-09-04 DIAGNOSIS — Z8673 Personal history of transient ischemic attack (TIA), and cerebral infarction without residual deficits: Secondary | ICD-10-CM | POA: Diagnosis not present

## 2022-09-04 DIAGNOSIS — Z7982 Long term (current) use of aspirin: Secondary | ICD-10-CM | POA: Diagnosis not present

## 2022-09-04 DIAGNOSIS — J432 Centrilobular emphysema: Secondary | ICD-10-CM | POA: Diagnosis not present

## 2022-09-04 DIAGNOSIS — I10 Essential (primary) hypertension: Secondary | ICD-10-CM | POA: Diagnosis not present

## 2022-09-04 DIAGNOSIS — S31109D Unspecified open wound of abdominal wall, unspecified quadrant without penetration into peritoneal cavity, subsequent encounter: Secondary | ICD-10-CM | POA: Diagnosis not present

## 2022-09-04 DIAGNOSIS — E1165 Type 2 diabetes mellitus with hyperglycemia: Secondary | ICD-10-CM | POA: Diagnosis not present

## 2022-09-04 DIAGNOSIS — Z9181 History of falling: Secondary | ICD-10-CM | POA: Diagnosis not present

## 2022-09-04 DIAGNOSIS — Z89512 Acquired absence of left leg below knee: Secondary | ICD-10-CM | POA: Diagnosis not present

## 2022-09-04 DIAGNOSIS — Z7902 Long term (current) use of antithrombotics/antiplatelets: Secondary | ICD-10-CM | POA: Diagnosis not present

## 2022-09-12 ENCOUNTER — Ambulatory Visit (INDEPENDENT_AMBULATORY_CARE_PROVIDER_SITE_OTHER): Payer: 59 | Admitting: Family Medicine

## 2022-09-12 ENCOUNTER — Encounter: Payer: Self-pay | Admitting: Family Medicine

## 2022-09-12 VITALS — BP 130/80 | HR 97 | Temp 97.9°F | Ht 72.0 in

## 2022-09-12 DIAGNOSIS — E1169 Type 2 diabetes mellitus with other specified complication: Secondary | ICD-10-CM

## 2022-09-12 DIAGNOSIS — E785 Hyperlipidemia, unspecified: Secondary | ICD-10-CM | POA: Diagnosis not present

## 2022-09-12 DIAGNOSIS — R059 Cough, unspecified: Secondary | ICD-10-CM | POA: Diagnosis not present

## 2022-09-12 DIAGNOSIS — J4489 Other specified chronic obstructive pulmonary disease: Secondary | ICD-10-CM | POA: Diagnosis not present

## 2022-09-12 DIAGNOSIS — I10 Essential (primary) hypertension: Secondary | ICD-10-CM | POA: Diagnosis not present

## 2022-09-12 LAB — POCT INFLUENZA A/B
Influenza A, POC: NEGATIVE
Influenza B, POC: NEGATIVE

## 2022-09-12 LAB — POC COVID19 BINAXNOW: SARS Coronavirus 2 Ag: NEGATIVE

## 2022-09-12 MED ORDER — PREDNISONE 20 MG PO TABS: 40.0000 mg | ORAL_TABLET | Freq: Every day | ORAL | 0 refills | Status: DC

## 2022-09-12 MED ORDER — DOXYCYCLINE HYCLATE 100 MG PO TABS: 100.0000 mg | ORAL_TABLET | Freq: Two times a day (BID) | ORAL | 0 refills | Status: DC

## 2022-09-12 NOTE — Patient Instructions (Addendum)
Nice to see you. We can to treat you for a COPD exacerbation with doxycycline and prednisone. We are going to check lab work and contact you with the results. If you develop worsening shortness of breath, cough productive of blood, or fevers please seek medical attention. Please call and let me know if you are taking trulicity or mounjaro.

## 2022-09-12 NOTE — Assessment & Plan Note (Signed)
Chronic issue.  Patient with likely COPD exacerbation.  COVID and flu testing were both negative.  We will treat for COPD exacerbation with doxycycline and prednisone.  If not improving he will let us know.  If he develops worsening symptoms he will seek medical attention.

## 2022-09-12 NOTE — Assessment & Plan Note (Signed)
Chronic issue.  Undetermined control.  Check A1c today.  Patient will continue Jardiance 25 mg daily and Humalog sliding scale.  He will confirm if he is on Mounjaro or Trulicity when he gets home.

## 2022-09-12 NOTE — Assessment & Plan Note (Signed)
Chronic issue.  Improved on recheck.  He will continue amlodipine 5 mg daily, carvedilol 12.5 mg twice daily, Imdur 30 mg daily, spironolactone 25 mg daily.  Consider restarting Entresto and Lasix once we get his lab work back.

## 2022-09-12 NOTE — Progress Notes (Signed)
Tommi Rumps, MD Phone: [phone]  Danny Estimable Mohrmann Sr. is a 72 y.o. male who presents today for f/u.  HYPERTENSION Disease Monitoring Home BP Monitoring not checking Chest pain- no    Dyspnea- see below Medications Compliance-  taking amlodipine, imdur, entresto, spironolactone.  Edema- no BMET    Component Value Date/Time   NA 140 08/02/2022 2001   NA 135 11/23/2014 2006   K 4.3 08/02/2022 2001   K 4.0 11/23/2014 2006   CL 108 08/02/2022 2001   CL 100 (L) 11/23/2014 2006   CO2 22 08/02/2022 2001   CO2 24 11/23/2014 2006   GLUCOSE 242 (H) 08/02/2022 2001   GLUCOSE 370 (H) 11/23/2014 2006   BUN 37 (H) 08/02/2022 2001   BUN 40 (H) 11/23/2014 2006   CREATININE 1.15 08/02/2022 2001   CREATININE 2.16 (H) 11/23/2014 2006   CALCIUM 9.3 08/02/2022 2001   CALCIUM 8.6 (L) 11/23/2014 2006   GFRNONAA >60 08/02/2022 2001   GFRNONAA 31 (L) 11/23/2014 2006   GFRAA 37 (L) 12/24/2019 1608   GFRAA 36 (L) 11/23/2014 2006   DIABETES Disease Monitoring: Blood Sugar ranges-not checking Polyuria/phagia/dipsia- no    Medications: Compliance- taking jardiance, humalog, and either mounjaro or trulicity Hypoglycemic symptoms- notes occasional  Cough/congestion: This has been going on at least a few days.  He has some shortness of breath.  He is felt feverish at times.  No postnasal drip.  Notes rhinorrhea.  He notes no flu or COVID exposures.   Social History   Tobacco Use  Smoking Status Former   Packs/day: 3.00   Years: 0.00   Total pack years: 0.00   Types: Cigarettes   Quit date: 09/07/2009   Years since quitting: 13.0  Smokeless Tobacco Never  Tobacco Comments   quit june 2011    Current Outpatient Medications on File Prior to Visit  Medication Sig Dispense Refill   acetaminophen (TYLENOL) 325 MG tablet Take 2 tablets (650 mg total) by mouth every 4 (four) hours as needed for mild pain (or temp > 37.5 C (99.5 F)).     albuterol (PROVENTIL) (2.5 MG/3ML) 0.083% nebulizer  solution Take 3 mLs (2.5 mg total) by nebulization every 6 (six) hours as needed for wheezing or shortness of breath. 150 mL 1   Albuterol Sulfate (PROAIR RESPICLICK) 702 (90 Base) MCG/ACT AEPB Inhale 1 puff into the lungs every 6 (six) hours as needed. 1 each 12   amLODipine (NORVASC) 5 MG tablet Take 1 tablet (5 mg total) by mouth daily. 90 tablet 3   aspirin EC 81 MG tablet Take 1 tablet (81 mg total) by mouth daily. 150 tablet 2   atorvastatin (LIPITOR) 80 MG tablet Take 1 tablet (80 mg total) by mouth daily. 90 tablet 3   butalbital-acetaminophen-caffeine (FIORICET) 50-325-40 MG tablet Take 1 tablet by mouth every 8 (eight) hours as needed for headache. 14 tablet 0   carvedilol (COREG) 12.5 MG tablet TAKE 1 TABLET (12.5 MG TOTAL) BY MOUTH 2 (TWO) TIMES DAILY WITH A MEAL. 180 tablet 3   clopidogrel (PLAVIX) 75 MG tablet Take 1 tablet (75 mg total) by mouth daily. 30 tablet 11   Continuous Blood Gluc Receiver (FREESTYLE LIBRE 2 READER) DEVI 1 Device by Does not apply route 4 (four) times daily. 1 each 0   Continuous Blood Gluc Sensor (FREESTYLE LIBRE 2 SENSOR) MISC USE 1 SENSOR EVERY 14 DAYS 6 each 3   empagliflozin (JARDIANCE) 25 MG TABS tablet Take 1 tablet (25 mg total) by  mouth daily. 90 tablet 3   escitalopram (LEXAPRO) 10 MG tablet Take 1 tablet (10 mg total) by mouth daily. 90 tablet 1   fluticasone-salmeterol (ADVAIR) 250-50 MCG/ACT AEPB Inhale 1 puff into the lungs 2 (two) times daily. 3 each 1   furosemide (LASIX) 20 MG tablet Hold until followup with cardiology due to acute kidney injury. 45 tablet 3   gabapentin (NEURONTIN) 300 MG capsule Take 1 capsule (300 mg total) by mouth at bedtime. 30 capsule 3   HYDROcodone-acetaminophen (NORCO/VICODIN) 5-325 MG tablet Take 1 tablet by mouth every 4 (four) hours as needed for moderate pain. 20 tablet 0   Incontinence Supply Disposable (PROCARE ADULT BRIEFS X-LARGE) MISC 1 Application by Does not apply route as needed (For incontinence). 50 each  3   insulin lispro (HUMALOG KWIKPEN) 100 UNIT/ML KwikPen Inject 0-15 Units into the skin 4 (four) times daily -  before meals and at bedtime. CBG < 70: Implement Hypoglycemia Standing Orders and refer to Hypoglycemia Standing Orders sidebar report CBG 70 - 120: 0 units CBG 121 - 150: 2 units CBG 151 - 200: 3 units CBG 201 - 250: 5 units CBG 251 - 300: 8 units CBG 301 - 350: 11 units CBG 351 - 400: 15 units CBG > 400: call MD 15 mL 11   Insulin Pen Needle (BD PEN NEEDLE MICRO U/F) 32G X 6 MM MISC 1 EACH BY DOES NOT APPLY ROUTE 2 (TWO) TIMES DAILY. 100 each 11   isosorbide mononitrate (IMDUR) 30 MG 24 hr tablet TAKE 1 TABLET BY MOUTH EVERY DAY 90 tablet 3   nitroGLYCERIN (NITROSTAT) 0.4 MG SL tablet Place 0.4 mg under the tongue every 5 (five) minutes as needed for chest pain.     nystatin ointment (MYCOSTATIN) APPLY TO AFFECTED AREA TWICE A DAY 30 g 0   ondansetron (ZOFRAN) 4 MG tablet Take 4 mg by mouth every 6 (six) hours as needed.     ondansetron (ZOFRAN-ODT) 4 MG disintegrating tablet Allow 1-2 tablets to dissolve in your mouth every 8 hours as needed for nausea/vomiting 30 tablet 0   sacubitril-valsartan (ENTRESTO) 49-51 MG Hold until followup with cardiology due to acute kidney injury. 180 tablet 1   spironolactone (ALDACTONE) 25 MG tablet TAKE 1 TABLET (25 MG TOTAL) BY MOUTH DAILY. 90 tablet 1   tamsulosin (FLOMAX) 0.4 MG CAPS capsule TAKE 1 CAPSULE BY MOUTH EVERY DAY 90 capsule 1   tirzepatide (MOUNJARO) 2.5 MG/0.5ML Pen INJECT 2.5 MG INTO THE SKIN ONCE A WEEK FOR 28 DAYS. 2 mL 0   tirzepatide (MOUNJARO) 5 MG/0.5ML Pen Inject 5 mg into the skin once a week. Start after you finish the 28 day course of the 2.5 mg dose. 6 mL 1   TRULICITY 4.5 FX/9.0WI SOPN SMARTSIG:4.5 Milligram(s) SUB-Q Once a Week     No current facility-administered medications on file prior to visit.     ROS see history of present illness  Objective  Physical Exam Vitals:   09/12/22 1534 09/12/22 1618  BP: (!)  140/80 130/80  Pulse: 97   Temp: 97.9 F (36.6 C)   SpO2: 93%     BP Readings from Last 3 Encounters:  09/12/22 130/80  08/07/22 120/70  08/03/22 (!) 176/103   Wt Readings from Last 3 Encounters:  08/02/22 236 lb (107 kg)  07/01/22 238 lb (108 kg)  05/31/22 235 lb 14.3 oz (107 kg)    Physical Exam Constitutional:      General: He is not in  acute distress.    Appearance: He is not diaphoretic.  HENT:     Mouth/Throat:     Mouth: Mucous membranes are moist.     Pharynx: Oropharynx is clear.  Cardiovascular:     Rate and Rhythm: Normal rate and regular rhythm.     Heart sounds: Normal heart sounds.  Pulmonary:     Effort: Pulmonary effort is normal.     Breath sounds: Normal breath sounds.  Lymphadenopathy:     Cervical: No cervical adenopathy.  Skin:    General: Skin is warm and dry.  Neurological:     Mental Status: He is alert.      Assessment/Plan: Please see individual problem list.  Essential hypertension Assessment & Plan: Chronic issue.  Improved on recheck.  He will continue amlodipine 5 mg daily, carvedilol 12.5 mg twice daily, Imdur 30 mg daily, spironolactone 25 mg daily.  Consider restarting Entresto and Lasix once we get his lab work back.  Orders: -     Lipid panel -     Comprehensive metabolic panel  COPD with chronic bronchitis Assessment & Plan: Chronic issue.  Patient with likely COPD exacerbation.  COVID and flu testing were both negative.  We will treat for COPD exacerbation with doxycycline and prednisone.  If not improving he will let us know.  If he develops worsening symptoms he will seek medical attention.  Orders: -     predniSONE; Take 2 tablets (40 mg total) by mouth daily with breakfast.  Dispense: 10 tablet; Refill: 0 -     Doxycycline Hyclate; Take 1 tablet (100 mg total) by mouth 2 (two) times daily.  Dispense: 14 tablet; Refill: 0  Type 2 diabetes mellitus with hyperlipidemia ([place]) Assessment & Plan: Chronic issue.   Undetermined control.  Check A1c today.  Patient will continue Jardiance 25 mg daily and Humalog sliding scale.  He will confirm if he is on Mounjaro or Trulicity when he gets home.  Orders: -     Hemoglobin A1c -     Lipid panel -     Comprehensive metabolic panel  Cough in adult -     Novel Coronavirus, NAA ([facility]) -     POCT Influenza A/B -     POC COVID-19 BinaxNow     Return in about 3 months (around 12/11/2022).   Tommi Rumps, MD [place]

## 2022-09-13 LAB — LIPID PANEL
Cholesterol: 136 mg/dL (ref 0–200)
HDL: 55.7 mg/dL (ref >39.00)
LDL Cholesterol: 59 mg/dL (ref 0–99)
NonHDL: 79.86
Total CHOL/HDL Ratio: 2
Triglycerides: 103 mg/dL (ref 0.0–149.0)
VLDL: 20.6 mg/dL (ref 0.0–40.0)

## 2022-09-13 LAB — COMPREHENSIVE METABOLIC PANEL
ALT: 42 U/L (ref 0–53)
AST: 22 U/L (ref 0–37)
Albumin: 4.2 g/dL (ref 3.5–5.2)
Alkaline Phosphatase: 93 U/L (ref 39–117)
BUN: 40 mg/dL — ABNORMAL HIGH (ref 6–23)
CO2: 21 mEq/L (ref 19–32)
Calcium: 9.2 mg/dL (ref 8.4–10.5)
Chloride: 104 mEq/L (ref 96–112)
Creatinine, Ser: 1.32 mg/dL (ref 0.40–1.50)
GFR: 54.22 mL/min — ABNORMAL LOW (ref >60.00)
Glucose, Bld: 335 mg/dL — ABNORMAL HIGH (ref 70–99)
Potassium: 4.1 mEq/L (ref 3.5–5.1)
Sodium: 137 mEq/L (ref 135–145)
Total Bilirubin: 0.4 mg/dL (ref 0.2–1.2)
Total Protein: 6.7 g/dL (ref 6.0–8.3)

## 2022-09-13 LAB — HEMOGLOBIN A1C: Hgb A1c MFr Bld: 9.6 % — ABNORMAL HIGH (ref 4.6–6.5)

## 2022-09-13 LAB — NOVEL CORONAVIRUS, NAA: SARS-CoV-2, NAA: NOT DETECTED

## 2022-09-17 DIAGNOSIS — J449 Chronic obstructive pulmonary disease, unspecified: Secondary | ICD-10-CM | POA: Diagnosis not present

## 2022-09-23 ENCOUNTER — Observation Stay
Admission: EM | Admit: 2022-09-23 | Discharge: 2022-09-25 | Disposition: A | Discharge status: Home Health | Payer: 59 | Attending: Internal Medicine | Admitting: Internal Medicine

## 2022-09-23 ENCOUNTER — Other Ambulatory Visit: Payer: Self-pay

## 2022-09-23 ENCOUNTER — Encounter: Payer: Self-pay | Admitting: Intensive Care

## 2022-09-23 ENCOUNTER — Emergency Department: Payer: 59

## 2022-09-23 DIAGNOSIS — Z794 Long term (current) use of insulin: Secondary | ICD-10-CM | POA: Diagnosis not present

## 2022-09-23 DIAGNOSIS — N183 Chronic kidney disease, stage 3 unspecified: Secondary | ICD-10-CM | POA: Insufficient documentation

## 2022-09-23 DIAGNOSIS — K529 Noninfective gastroenteritis and colitis, unspecified: Principal | ICD-10-CM | POA: Diagnosis present

## 2022-09-23 DIAGNOSIS — E872 Acidosis, unspecified: Secondary | ICD-10-CM | POA: Insufficient documentation

## 2022-09-23 DIAGNOSIS — Z789 Other specified health status: Secondary | ICD-10-CM | POA: Diagnosis present

## 2022-09-23 DIAGNOSIS — I700 Atherosclerosis of aorta: Secondary | ICD-10-CM | POA: Diagnosis not present

## 2022-09-23 DIAGNOSIS — K5900 Constipation, unspecified: Secondary | ICD-10-CM | POA: Diagnosis not present

## 2022-09-23 DIAGNOSIS — D72829 Elevated white blood cell count, unspecified: Secondary | ICD-10-CM | POA: Insufficient documentation

## 2022-09-23 DIAGNOSIS — Z7902 Long term (current) use of antithrombotics/antiplatelets: Secondary | ICD-10-CM | POA: Insufficient documentation

## 2022-09-23 DIAGNOSIS — J4489 Other specified chronic obstructive pulmonary disease: Secondary | ICD-10-CM | POA: Diagnosis present

## 2022-09-23 DIAGNOSIS — R112 Nausea with vomiting, unspecified: Secondary | ICD-10-CM | POA: Diagnosis not present

## 2022-09-23 DIAGNOSIS — N179 Acute kidney failure, unspecified: Secondary | ICD-10-CM | POA: Diagnosis present

## 2022-09-23 DIAGNOSIS — Z7985 Long-term (current) use of injectable non-insulin antidiabetic drugs: Secondary | ICD-10-CM | POA: Diagnosis not present

## 2022-09-23 DIAGNOSIS — E8729 Other acidosis: Secondary | ICD-10-CM | POA: Diagnosis present

## 2022-09-23 DIAGNOSIS — Z79899 Other long term (current) drug therapy: Secondary | ICD-10-CM | POA: Insufficient documentation

## 2022-09-23 DIAGNOSIS — I130 Hypertensive heart and chronic kidney disease with heart failure and stage 1 through stage 4 chronic kidney disease, or unspecified chronic kidney disease: Secondary | ICD-10-CM | POA: Diagnosis not present

## 2022-09-23 DIAGNOSIS — E860 Dehydration: Secondary | ICD-10-CM

## 2022-09-23 DIAGNOSIS — Z743 Need for continuous supervision: Secondary | ICD-10-CM | POA: Diagnosis not present

## 2022-09-23 DIAGNOSIS — Z7984 Long term (current) use of oral hypoglycemic drugs: Secondary | ICD-10-CM | POA: Diagnosis not present

## 2022-09-23 DIAGNOSIS — J449 Chronic obstructive pulmonary disease, unspecified: Secondary | ICD-10-CM | POA: Insufficient documentation

## 2022-09-23 DIAGNOSIS — Z7982 Long term (current) use of aspirin: Secondary | ICD-10-CM | POA: Diagnosis not present

## 2022-09-23 DIAGNOSIS — Z89512 Acquired absence of left leg below knee: Secondary | ICD-10-CM

## 2022-09-23 DIAGNOSIS — I10 Essential (primary) hypertension: Secondary | ICD-10-CM | POA: Diagnosis present

## 2022-09-23 DIAGNOSIS — I25118 Atherosclerotic heart disease of native coronary artery with other forms of angina pectoris: Secondary | ICD-10-CM | POA: Diagnosis present

## 2022-09-23 DIAGNOSIS — N281 Cyst of kidney, acquired: Secondary | ICD-10-CM | POA: Diagnosis not present

## 2022-09-23 DIAGNOSIS — E785 Hyperlipidemia, unspecified: Secondary | ICD-10-CM | POA: Insufficient documentation

## 2022-09-23 DIAGNOSIS — Z955 Presence of coronary angioplasty implant and graft: Secondary | ICD-10-CM | POA: Diagnosis not present

## 2022-09-23 DIAGNOSIS — M6282 Rhabdomyolysis: Secondary | ICD-10-CM | POA: Diagnosis not present

## 2022-09-23 DIAGNOSIS — R197 Diarrhea, unspecified: Secondary | ICD-10-CM | POA: Diagnosis present

## 2022-09-23 DIAGNOSIS — Z1152 Encounter for screening for COVID-19: Secondary | ICD-10-CM | POA: Diagnosis not present

## 2022-09-23 DIAGNOSIS — I5042 Chronic combined systolic (congestive) and diastolic (congestive) heart failure: Secondary | ICD-10-CM | POA: Insufficient documentation

## 2022-09-23 DIAGNOSIS — E1169 Type 2 diabetes mellitus with other specified complication: Secondary | ICD-10-CM | POA: Diagnosis present

## 2022-09-23 DIAGNOSIS — E1122 Type 2 diabetes mellitus with diabetic chronic kidney disease: Secondary | ICD-10-CM | POA: Diagnosis not present

## 2022-09-23 DIAGNOSIS — I2510 Atherosclerotic heart disease of native coronary artery without angina pectoris: Secondary | ICD-10-CM | POA: Diagnosis present

## 2022-09-23 DIAGNOSIS — R531 Weakness: Secondary | ICD-10-CM | POA: Diagnosis not present

## 2022-09-23 DIAGNOSIS — Z87891 Personal history of nicotine dependence: Secondary | ICD-10-CM | POA: Insufficient documentation

## 2022-09-23 DIAGNOSIS — W19XXXA Unspecified fall, initial encounter: Secondary | ICD-10-CM | POA: Diagnosis not present

## 2022-09-23 DIAGNOSIS — F32A Depression, unspecified: Secondary | ICD-10-CM | POA: Diagnosis present

## 2022-09-23 DIAGNOSIS — I5032 Chronic diastolic (congestive) heart failure: Secondary | ICD-10-CM | POA: Diagnosis present

## 2022-09-23 DIAGNOSIS — Z741 Need for assistance with personal care: Secondary | ICD-10-CM | POA: Insufficient documentation

## 2022-09-23 LAB — URINALYSIS, ROUTINE W REFLEX MICROSCOPIC
Bacteria, UA: NONE SEEN
Bilirubin Urine: NEGATIVE
Glucose, UA: >=500 mg/dL — AB
Ketones, ur: 20 mg/dL — AB
Leukocytes,Ua: NEGATIVE
Nitrite: NEGATIVE
Protein, ur: 30 mg/dL — AB
Specific Gravity, Urine: 1.03 (ref 1.005–1.030)
Squamous Epithelial / HPF: NONE SEEN /HPF (ref 0–5)
pH: 5 (ref 5.0–8.0)

## 2022-09-23 LAB — CBC WITH DIFFERENTIAL/PLATELET
Abs Immature Granulocytes: 0.07 10*3/uL (ref 0.00–0.07)
Basophils Absolute: 0 10*3/uL (ref 0.0–0.1)
Basophils Relative: 0 %
Eosinophils Absolute: 0.1 10*3/uL (ref 0.0–0.5)
Eosinophils Relative: 1 %
HCT: 48 % (ref 39.0–52.0)
Hemoglobin: 15.6 g/dL (ref 13.0–17.0)
Immature Granulocytes: 1 %
Lymphocytes Relative: 6 %
Lymphs Abs: 0.7 10*3/uL (ref 0.7–4.0)
MCH: 31.1 pg (ref 26.0–34.0)
MCHC: 32.5 g/dL (ref 30.0–36.0)
MCV: 95.8 fL (ref 80.0–100.0)
Monocytes Absolute: 1.2 10*3/uL — ABNORMAL HIGH (ref 0.1–1.0)
Monocytes Relative: 11 %
Neutro Abs: 9.2 10*3/uL — ABNORMAL HIGH (ref 1.7–7.7)
Neutrophils Relative %: 81 %
Platelets: 203 10*3/uL (ref 150–400)
RBC: 5.01 MIL/uL (ref 4.22–5.81)
RDW: 13.1 % (ref 11.5–15.5)
WBC: 11.4 10*3/uL — ABNORMAL HIGH (ref 4.0–10.5)
nRBC: 0 % (ref 0.0–0.2)

## 2022-09-23 LAB — COMPREHENSIVE METABOLIC PANEL
ALT: 35 U/L (ref 0–44)
AST: 54 U/L — ABNORMAL HIGH (ref 15–41)
Albumin: 3.5 g/dL (ref 3.5–5.0)
Alkaline Phosphatase: 78 U/L (ref 38–126)
Anion gap: 16 — ABNORMAL HIGH (ref 5–15)
BUN: 42 mg/dL — ABNORMAL HIGH (ref 8–23)
CO2: 20 mmol/L — ABNORMAL LOW (ref 22–32)
Calcium: 8.7 mg/dL — ABNORMAL LOW (ref 8.9–10.3)
Chloride: 96 mmol/L — ABNORMAL LOW (ref 98–111)
Creatinine, Ser: 1.39 mg/dL — ABNORMAL HIGH (ref 0.61–1.24)
GFR, Estimated: 54 mL/min — ABNORMAL LOW (ref >60)
Glucose, Bld: 177 mg/dL — ABNORMAL HIGH (ref 70–99)
Potassium: 3.4 mmol/L — ABNORMAL LOW (ref 3.5–5.1)
Sodium: 132 mmol/L — ABNORMAL LOW (ref 135–145)
Total Bilirubin: 1.2 mg/dL (ref 0.3–1.2)
Total Protein: 6.8 g/dL (ref 6.5–8.1)

## 2022-09-23 LAB — RESP PANEL BY RT-PCR (RSV, FLU A&B, COVID)  RVPGX2
Influenza A by PCR: NEGATIVE
Influenza B by PCR: NEGATIVE
Resp Syncytial Virus by PCR: NEGATIVE
SARS Coronavirus 2 by RT PCR: NEGATIVE

## 2022-09-23 LAB — CK: Total CK: 905 U/L — ABNORMAL HIGH (ref 49–397)

## 2022-09-23 LAB — LACTIC ACID, PLASMA
Lactic Acid, Venous: 1.5 mmol/L (ref 0.5–1.9)
Lactic Acid, Venous: 1.1 mmol/L (ref 0.5–1.9)

## 2022-09-23 LAB — TROPONIN I (HIGH SENSITIVITY)
Troponin I (High Sensitivity): 37 ng/L — ABNORMAL HIGH (ref <18)
Troponin I (High Sensitivity): 35 ng/L — ABNORMAL HIGH (ref <18)

## 2022-09-23 LAB — LIPASE, BLOOD: Lipase: 36 U/L (ref 11–51)

## 2022-09-23 MED ORDER — ONDANSETRON HCL 4 MG/2ML IJ SOLN: 4.0000 mg | Freq: Four times a day (QID) | INTRAMUSCULAR | Status: DC | PRN

## 2022-09-23 MED ORDER — AMLODIPINE BESYLATE 5 MG PO TABS
5.0000 mg | ORAL_TABLET | Freq: Every day | ORAL | Status: DC
  Administered 2022-09-24: 5 mg via ORAL
  Filled 2022-09-23: qty 1

## 2022-09-23 MED ORDER — INSULIN ASPART 100 UNIT/ML IJ SOLN
0.0000 [IU] | Freq: Every day | INTRAMUSCULAR | Status: DC
  Administered 2022-09-24 (×2): 2 [IU] via SUBCUTANEOUS
  Filled 2022-09-23 (×2): qty 1

## 2022-09-23 MED ORDER — MORPHINE SULFATE (PF) 2 MG/ML IV SOLN: 2.0000 mg | INTRAVENOUS | Status: DC | PRN

## 2022-09-23 MED ORDER — ESCITALOPRAM OXALATE 10 MG PO TABS
10.0000 mg | ORAL_TABLET | Freq: Every day | ORAL | Status: DC
  Administered 2022-09-24 – 2022-09-25 (×2): 10 mg via ORAL
  Filled 2022-09-23 (×2): qty 1

## 2022-09-23 MED ORDER — ISOSORBIDE MONONITRATE ER 30 MG PO TB24
30.0000 mg | ORAL_TABLET | Freq: Every day | ORAL | Status: DC
  Administered 2022-09-24 – 2022-09-25 (×2): 30 mg via ORAL
  Filled 2022-09-23 (×2): qty 1

## 2022-09-23 MED ORDER — LACTATED RINGERS IV SOLN: INTRAVENOUS | Status: AC

## 2022-09-23 MED ORDER — IOHEXOL 300 MG/ML  SOLN
100.0000 mL | Freq: Once | INTRAMUSCULAR | Status: AC | PRN
  Administered 2022-09-23: 100 mL via INTRAVENOUS

## 2022-09-23 MED ORDER — SPIRONOLACTONE 25 MG PO TABS
25.0000 mg | ORAL_TABLET | Freq: Every day | ORAL | Status: DC
  Administered 2022-09-24 – 2022-09-25 (×2): 25 mg via ORAL
  Filled 2022-09-23 (×2): qty 1

## 2022-09-23 MED ORDER — FUROSEMIDE 20 MG PO TABS
20.0000 mg | ORAL_TABLET | Freq: Every day | ORAL | Status: DC
  Administered 2022-09-24: 20 mg via ORAL
  Filled 2022-09-23: qty 1

## 2022-09-23 MED ORDER — LIDOCAINE 5 % EX PTCH
1.0000 | MEDICATED_PATCH | CUTANEOUS | Status: DC
  Administered 2022-09-24 (×2): 1 via TRANSDERMAL
  Filled 2022-09-23 (×2): qty 1

## 2022-09-23 MED ORDER — ACETAMINOPHEN 325 MG PO TABS
650.0000 mg | ORAL_TABLET | Freq: Four times a day (QID) | ORAL | Status: DC | PRN
  Administered 2022-09-24: 650 mg via ORAL
  Filled 2022-09-23: qty 2

## 2022-09-23 MED ORDER — HYDROCODONE-ACETAMINOPHEN 5-325 MG PO TABS: 1.0000 | ORAL_TABLET | ORAL | Status: DC | PRN

## 2022-09-23 MED ORDER — ONDANSETRON HCL 4 MG PO TABS: 4.0000 mg | ORAL_TABLET | Freq: Four times a day (QID) | ORAL | Status: DC | PRN

## 2022-09-23 MED ORDER — ALBUTEROL SULFATE (2.5 MG/3ML) 0.083% IN NEBU
2.5000 mg | INHALATION_SOLUTION | Freq: Four times a day (QID) | RESPIRATORY_TRACT | Status: DC | PRN
  Administered 2022-09-24: 2.5 mg via RESPIRATORY_TRACT
  Filled 2022-09-23: qty 3

## 2022-09-23 MED ORDER — BUTALBITAL-APAP-CAFFEINE 50-325-40 MG PO TABS: 1.0000 | ORAL_TABLET | Freq: Three times a day (TID) | ORAL | Status: DC | PRN

## 2022-09-23 MED ORDER — EMPAGLIFLOZIN 25 MG PO TABS
25.0000 mg | ORAL_TABLET | Freq: Every day | ORAL | Status: DC
  Administered 2022-09-24 – 2022-09-25 (×2): 25 mg via ORAL
  Filled 2022-09-23 (×2): qty 1

## 2022-09-23 MED ORDER — SODIUM CHLORIDE 0.9 % IV BOLUS
1000.0000 mL | Freq: Once | INTRAVENOUS | Status: AC
  Administered 2022-09-23: 1000 mL via INTRAVENOUS

## 2022-09-23 MED ORDER — ENOXAPARIN SODIUM 60 MG/0.6ML IJ SOSY
0.5000 mg/kg | PREFILLED_SYRINGE | INTRAMUSCULAR | Status: DC
  Administered 2022-09-24 (×2): 52.5 mg via SUBCUTANEOUS
  Filled 2022-09-23 (×2): qty 0.6

## 2022-09-23 MED ORDER — NITROGLYCERIN 0.4 MG SL SUBL: 0.4000 mg | SUBLINGUAL_TABLET | SUBLINGUAL | Status: DC | PRN

## 2022-09-23 MED ORDER — TAMSULOSIN HCL 0.4 MG PO CAPS
0.4000 mg | ORAL_CAPSULE | Freq: Every day | ORAL | Status: DC
  Administered 2022-09-24 – 2022-09-25 (×2): 0.4 mg via ORAL
  Filled 2022-09-23 (×2): qty 1

## 2022-09-23 MED ORDER — ATORVASTATIN CALCIUM 20 MG PO TABS
80.0000 mg | ORAL_TABLET | Freq: Every day | ORAL | Status: DC
  Administered 2022-09-24: 80 mg via ORAL
  Filled 2022-09-23: qty 4

## 2022-09-23 MED ORDER — MOMETASONE FURO-FORMOTEROL FUM 200-5 MCG/ACT IN AERO
2.0000 | INHALATION_SPRAY | Freq: Two times a day (BID) | RESPIRATORY_TRACT | Status: DC
  Administered 2022-09-24 – 2022-09-25 (×3): 2 via RESPIRATORY_TRACT
  Filled 2022-09-23: qty 8.8

## 2022-09-23 MED ORDER — CARVEDILOL 6.25 MG PO TABS
12.5000 mg | ORAL_TABLET | Freq: Two times a day (BID) | ORAL | Status: DC
  Administered 2022-09-24: 12.5 mg via ORAL
  Filled 2022-09-23: qty 2

## 2022-09-23 MED ORDER — INSULIN ASPART 100 UNIT/ML IJ SOLN
0.0000 [IU] | Freq: Three times a day (TID) | INTRAMUSCULAR | Status: DC
  Administered 2022-09-24: 2 [IU] via SUBCUTANEOUS
  Administered 2022-09-24 – 2022-09-25 (×3): 3 [IU] via SUBCUTANEOUS
  Filled 2022-09-23 (×4): qty 1

## 2022-09-23 MED ORDER — ACETAMINOPHEN 650 MG RE SUPP: 650.0000 mg | Freq: Four times a day (QID) | RECTAL | Status: DC | PRN

## 2022-09-23 MED ORDER — ASPIRIN 81 MG PO TBEC
81.0000 mg | DELAYED_RELEASE_TABLET | Freq: Every day | ORAL | Status: DC
  Administered 2022-09-24 – 2022-09-25 (×2): 81 mg via ORAL
  Filled 2022-09-23 (×2): qty 1

## 2022-09-23 NOTE — Assessment & Plan Note (Signed)
Assistance with transfers Fall precautions.

## 2022-09-23 NOTE — Assessment & Plan Note (Signed)
Continue Coreg, Imdur, carvedilol, atorvastatin and aspirin

## 2022-09-23 NOTE — ED Triage Notes (Signed)
First Nurse Note;  Pt via EMS from home. Pt here for "brief change" Pt has been sitting in feces. Pt has hx of R leg amputation. Denies any complaints. There is no one to take care of him. Pt had a home health aid but she quit. Per EMS, pt's house is full of roaches and feces on the wall. Pt is A&Ox4 and NAD 135/78 BP  92 HR  96% on RA

## 2022-09-23 NOTE — Assessment & Plan Note (Signed)
CKD open 900 Continue hydration and monitor

## 2022-09-23 NOTE — Assessment & Plan Note (Addendum)
Creatinine 1.39, up from baseline of around 1.1.  Now improved with hydration Continue IV hydration and monitor  Lab Results  Component Value Date   CREATININE 1.10 09/24/2022   CREATININE 1.39 (H) 09/23/2022   CREATININE 1.32 09/12/2022

## 2022-09-23 NOTE — Assessment & Plan Note (Signed)
IV fluids, IV antiemetics and supportive care Clear liquid diet and advance as tolerated Patient states stool is formed but frequent Culture if diarrheal stool

## 2022-09-23 NOTE — H&P (Signed)
History and Physical    Patient: Danny [name] [id number] DOB: 12/13/1950 DOA: 09/23/2022 DOS: the patient was seen and examined on 09/23/2022 PCP: [name], MD  Patient coming from: Home  Chief Complaint:  Chief Complaint  Patient presents with   Diarrhea   Emesis    HPI: Danny BOGACKI Sr. is a 72 y.o. male with medical history significant for hypertension, diabetes mellitus, COPD, hypertension, CAD, chronic systolic CHF, and PAD s/p  left BKA , last hospitalized in November 2023 with chest pain and AKI who presents to the ED with vomiting, frequent bowel movements and weakness and inability to care for self.  Patient had a home health aide that quit several days ago.  He has a son that checks in on him.  On arrival of EMS patient was reportedly caked in stool and urine.  He denies fevers or chills, chest pain, cough or shortness of breath.  He denies blood in his stool and his emesis is nonbloody nonbilious.  Denies abdominal pain. ED course and dataReview: Vitals within normal limits.  Labs significant for WBC of 11,400 with lactic acid 1.1.  Creatinine 1.39, from baseline of around 1.1 CK elevated at 905.  Mild metabolic acidosis with bicarb of 20 and anion gap of 16 with ketonuria and minor electrolyte abnormalities including sodium 132 and potassium 3.4.  Respiratory viral panel negative, UA unremarkable..  Troponin 35. CT abdomen and pelvis was nonacute and showed moderate colonic stool burden without obstruction. Patient was given an NS bolus.  Hospitalist consulted for admission for acute gastroenteritis and patient being unable to care for self.   Review of Systems: As mentioned in the history of present illness. All other systems reviewed and are negative.  Past Medical History:  Diagnosis Date   CAD (coronary artery disease)    a. 01/2010 PCI of LAD; b. 09/2014 PCI/DES of mLAD due to ISR. D1 80, D2 80(jailed), LCX 56m c. 07/2016 NSTEMI/Cath: LM nl, LAD 249mSR,  40d, D1 90ost, 80p, D2 90ost, RI min irregs, LCX min irregs, OM1 90 small, OM2/3 min irregs, RCA min irregs, RPLB1 90, EF 25-35%.   Chronic combined systolic and diastolic CH[place]   a. 07/2016 Echo: EF 30%, severe septal/anterior HK, Gr1 DD.   CKD (chronic kidney disease), stage III (HCC)    COPD (chronic obstructive pulmonary disease) (HCC)    DM2 (diabetes mellitus, type 2) (HCC)    Erectile dysfunction    HLD (hyperlipidemia)    HTN (hypertension)    Ischemic cardiomyopathy    a. 07/2016 Echo: EF 30% w/ sev septal/ant HK. Gr1 DD.   Past Surgical History:  Procedure Laterality Date   AMPUTATION Left 11/10/2020   Procedure: AMPUTATION BELOW KNEE;  Surgeon: DeAlgernon HuxleyMD;  Location: [facility];  Service: General;  Laterality: Left;   BELOW KNEE LEG AMPUTATION     CAD: stent to the LAD     CARDIAC CATHETERIZATION  10/01/2014   CARDIAC CATHETERIZATION N/A 07/23/2016   Procedure: Left Heart Cath and Coronary Angiography;  Surgeon: MuWellington HampshireMD;  [facility];  Service: Cardiovascular;  Laterality: N/A;   CORONARY ANGIOPLASTY WITH STENT PLACEMENT  10/01/2014   LOWER EXTREMITY ANGIOGRAPHY Left 10/31/2020   Procedure: LOWER EXTREMITY ANGIOGRAPHY;  Surgeon: DeAlgernon HuxleyMD;  [facility];  Service: Cardiovascular;  Laterality: Left;   LOWER EXTREMITY ANGIOGRAPHY Right 11/20/2021   Procedure: Lower Extremity Angiography;  Surgeon: [name], MD;  Location: [facility];  Service: Cardiovascular;  Laterality: Right;   Social History:  reports that he quit smoking about 13 years ago. His smoking use included cigarettes. He smoked an average of 3.00 packs per day. He has never used smokeless tobacco. He reports that he does not currently use alcohol after a past usage of about 7.0 standard drinks of alcohol per week. He reports that he does not use drugs.  Allergies  Allergen Reactions   Metformin And Related  Diarrhea    If he takes it twice a day it causes diarhea, so he reduced to once daily    Family History  Problem Relation Age of Onset   Heart attack Father        complications   Cancer Brother     Prior to Admission medications   Medication Sig Start Date End Date Taking? Authorizing Provider  acetaminophen (TYLENOL) 325 MG tablet Take 2 tablets (650 mg total) by mouth every 4 (four) hours as needed for mild pain (or temp > 37.5 C (99.5 F)). 10/10/21   Barb Merino, MD  albuterol (PROVENTIL) (2.5 MG/3ML) 0.083% nebulizer solution Take 3 mLs (2.5 mg total) by nebulization every 6 (six) hours as needed for wheezing or shortness of breath. 09/30/20   [name], MD  Albuterol Sulfate (PROAIR RESPICLICK) [id number] (90 Base) MCG/ACT AEPB Inhale 1 puff into the lungs every 6 (six) hours as needed. 12/18/21   [name], MD  amLODipine (NORVASC) 5 MG tablet Take 1 tablet (5 mg total) by mouth daily. 08/07/22 08/02/23  [name], MD  aspirin EC 81 MG tablet Take 1 tablet (81 mg total) by mouth daily. 11/20/21   [name], MD  atorvastatin (LIPITOR) 80 MG tablet Take 1 tablet (80 mg total) by mouth daily. 01/03/22   [name], MD  butalbital-acetaminophen-caffeine (FIORICET) [phone] MG tablet Take 1 tablet by mouth every 8 (eight) hours as needed for headache. 10/10/21   Barb Merino, MD  carvedilol (COREG) 12.5 MG tablet TAKE 1 TABLET (12.5 MG TOTAL) BY MOUTH 2 (TWO) TIMES DAILY WITH A MEAL. 07/19/22   Dutch Quint B, FNP  clopidogrel (PLAVIX) 75 MG tablet Take 1 tablet (75 mg total) by mouth daily. 11/20/21   [name], MD  Continuous Blood Gluc Receiver (FREESTYLE LIBRE 2 READER) DEVI 1 Device by Does not apply route 4 (four) times daily. 04/27/22   [name], MD  Continuous Blood Gluc Sensor (FREESTYLE LIBRE 2 SENSOR) MISC USE 1 SENSOR EVERY 14 DAYS 08/15/22   [name], MD  doxycycline (VIBRA-TABS) 100 MG tablet Take 1 tablet (100 mg total) by mouth  2 (two) times daily. 09/12/22   [name], MD  empagliflozin (JARDIANCE) 25 MG TABS tablet Take 1 tablet (25 mg total) by mouth daily. 01/03/22   [name], MD  escitalopram (LEXAPRO) 10 MG tablet Take 1 tablet (10 mg total) by mouth daily. 01/03/22   [name], MD  fluticasone-salmeterol (ADVAIR) 250-50 MCG/ACT AEPB Inhale 1 puff into the lungs 2 (two) times daily. 07/24/21   [name], MD  furosemide (LASIX) 20 MG tablet Hold until followup with cardiology due to acute kidney injury. 07/01/22   Enzo Bi, MD  gabapentin (NEURONTIN) 300 MG capsule Take 1 capsule (300 mg total) by mouth at bedtime. 12/06/20   Kris Hartmann, NP  HYDROcodone-acetaminophen (NORCO/VICODIN) 5-325 MG tablet Take 1 tablet by  mouth every 4 (four) hours as needed for moderate pain. 02/19/22 02/19/23  Cuthriell, Charline Bills, PA-C  Incontinence Supply Disposable (PROCARE ADULT BRIEFS X-LARGE) MISC 1 Application by Does not apply route as needed (For incontinence). 04/30/22   [name], MD  insulin lispro (HUMALOG KWIKPEN) 100 UNIT/ML KwikPen Inject 0-15 Units into the skin 4 (four) times daily -  before meals and at bedtime. CBG < 70: Implement Hypoglycemia Standing Orders and refer to Hypoglycemia Standing Orders sidebar report CBG 70 - 120: 0 units CBG 121 - 150: 2 units CBG 151 - 200: 3 units CBG 201 - 250: 5 units CBG 251 - 300: 8 units CBG 301 - 350: 11 units CBG 351 - 400: 15 units CBG > 400: call MD 05/24/22   [name], MD  Insulin Pen Needle (BD PEN NEEDLE MICRO U/F) 32G X 6 MM MISC 1 EACH BY DOES NOT APPLY ROUTE 2 (TWO) TIMES DAILY. 05/28/22   [name], MD  isosorbide mononitrate (IMDUR) 30 MG 24 hr tablet TAKE 1 TABLET BY MOUTH EVERY DAY 04/03/21   [name], MD  nitroGLYCERIN (NITROSTAT) 0.4 MG SL tablet Place 0.4 mg under the tongue every 5 (five) minutes as needed for chest pain.    [provider]  nystatin ointment (MYCOSTATIN) APPLY TO AFFECTED  AREA TWICE A DAY 07/18/22   [name], MD  ondansetron (ZOFRAN) 4 MG tablet Take 4 mg by mouth every 6 (six) hours as needed. 11/15/21   [provider]  ondansetron (ZOFRAN-ODT) 4 MG disintegrating tablet Allow 1-2 tablets to dissolve in your mouth every 8 hours as needed for nausea/vomiting 02/08/22   Hinda Kehr, MD  predniSONE (DELTASONE) 20 MG tablet Take 2 tablets (40 mg total) by mouth daily with breakfast. 09/12/22   [name], MD  sacubitril-valsartan (ENTRESTO) 49-51 MG Hold until followup with cardiology due to acute kidney injury. 07/01/22   Enzo Bi, MD  spironolactone (ALDACTONE) 25 MG tablet TAKE 1 TABLET (25 MG TOTAL) BY MOUTH DAILY. 07/30/22   Dutch Quint B, FNP  tamsulosin (FLOMAX) 0.4 MG CAPS capsule TAKE 1 CAPSULE BY MOUTH EVERY DAY 07/30/22   Dutch Quint B, FNP  tirzepatide [facility]) 2.5 MG/0.5ML Pen INJECT 2.5 MG INTO THE SKIN ONCE A WEEK FOR 28 DAYS. 06/07/22   [name], MD  tirzepatide [facility]) 5 MG/0.5ML Pen Inject 5 mg into the skin once a week. Start after you finish the 28 day course of the 2.5 mg dose. 07/18/22   [name], MD  TRULICITY 4.5 0000000 SOPN SMARTSIG:4.5 Milligram(s) SUB-Q Once a Week 06/18/22   [provider]    Physical Exam: Vitals:   09/23/22 2030 09/23/22 2130 09/23/22 2140 09/23/22 2241  BP: (!) 166/92     Pulse: 96     Resp: 17     Temp:    98.2 F (36.8 C)  TempSrc:    Oral  SpO2: 91% 94% 95%   Weight:      Height:       Physical Exam Vitals and nursing note reviewed.  Constitutional:      General: He is not in acute distress. HENT:     Head: Normocephalic and atraumatic.  Cardiovascular:     Rate and Rhythm: Normal rate and regular rhythm.     Heart sounds: Normal heart sounds.  Pulmonary:     Effort: Pulmonary effort is normal.     Breath sounds: Normal breath sounds.  Abdominal:     Palpations: Abdomen is soft.     Tenderness: There is no abdominal tenderness.   Musculoskeletal:     Comments: Left BKA  Neurological:     Mental Status: Mental status is at baseline.     Labs on Admission: I have personally reviewed following labs and imaging studies  CBC: Recent Labs  Lab 09/23/22 1931  WBC 11.4*  NEUTROABS 9.2*  HGB 15.6  HCT 48.0  MCV 95.8  PLT [id number]   Basic Metabolic Panel: Recent Labs  Lab 09/23/22 1931  NA 132*  K 3.4*  CL 96*  CO2 20*  GLUCOSE 177*  BUN 42*  CREATININE 1.39*  CALCIUM 8.7*   GFR: Estimated Creatinine Clearance: 60.9 mL/min (A) (by C-G formula based on SCr of 1.39 mg/dL (H)). Liver Function Tests: Recent Labs  Lab 09/23/22 1931  AST 54*  ALT 35  ALKPHOS 78  BILITOT 1.2  PROT 6.8  ALBUMIN 3.5   Recent Labs  Lab 09/23/22 1931  LIPASE 36   No results for input(s): "AMMONIA" in the last 168 hours. Coagulation Profile: No results for input(s): "INR", "PROTIME" in the last 168 hours. Cardiac Enzymes: Recent Labs  Lab 09/23/22 1931  CKTOTAL 905*   BNP (last 3 results) No results for input(s): "PROBNP" in the last 8760 hours. HbA1C: No results for input(s): "HGBA1C" in the last 72 hours. CBG: No results for input(s): "GLUCAP" in the last 168 hours. Lipid Profile: No results for input(s): "CHOL", "HDL", "LDLCALC", "TRIG", "CHOLHDL", "LDLDIRECT" in the last 72 hours. Thyroid Function Tests: No results for input(s): "TSH", "T4TOTAL", "FREET4", "T3FREE", "THYROIDAB" in the last 72 hours. Anemia Panel: No results for input(s): "VITAMINB12", "FOLATE", "FERRITIN", "TIBC", "IRON", "RETICCTPCT" in the last 72 hours. Urine analysis:    Component Value Date/Time   COLORURINE YELLOW (A) 09/23/2022 1931   APPEARANCEUR CLEAR (A) 09/23/2022 1931   APPEARANCEUR Clear 11/23/2014 2124   LABSPEC 1.030 09/23/2022 1931   LABSPEC 1.025 11/23/2014 2124   PHURINE 5.0 09/23/2022 1931   GLUCOSEU >=500 (A) 09/23/2022 1931   GLUCOSEU >=500 11/23/2014 2124   HGBUR MODERATE (A) 09/23/2022 1931   BILIRUBINUR  NEGATIVE 09/23/2022 1931   BILIRUBINUR Negative 11/23/2014 2124   KETONESUR 20 (A) 09/23/2022 1931   PROTEINUR 30 (A) 09/23/2022 1931   NITRITE NEGATIVE 09/23/2022 1931   LEUKOCYTESUR NEGATIVE 09/23/2022 1931   LEUKOCYTESUR Negative 11/23/2014 2124    Radiological Exams on Admission: CT ABDOMEN PELVIS W CONTRAST  Result Date: 09/23/2022 CLINICAL DATA:  Abdominal pain. EXAM: CT ABDOMEN AND PELVIS WITH CONTRAST TECHNIQUE: Multidetector CT imaging of the abdomen and pelvis was performed using the standard protocol following bolus administration of intravenous contrast. RADIATION DOSE REDUCTION: This exam was performed according to the departmental dose-optimization program which includes automated exposure control, adjustment of the mA and/or kV according to patient size and/or use of iterative reconstruction technique. CONTRAST:  170m OMNIPAQUE IOHEXOL 300 MG/ML  SOLN COMPARISON:  CT dated 01/08/2022. FINDINGS: Lower chest: There is emphysematous changes of the lungs. Left lung base linear atelectasis/scarring. The visualized lung bases are otherwise clear. There is 3 vessel coronary vascular calcification. No intra-abdominal free air or free fluid. Hepatobiliary: The liver is unremarkable. No biliary dilatation. The gallbladder is unremarkable. Pancreas: Unremarkable. No pancreatic ductal dilatation or surrounding inflammatory changes. Spleen: Normal in size without focal abnormality. Adrenals/Urinary Tract: The adrenal glands unremarkable. There is a small left renal upper pole cyst. No imaging follow-up. There is no hydronephrosis on either side. There  is symmetric enhancement and excretion of contrast by both kidneys. The visualized ureters and urinary bladder appear unremarkable. Stomach/Bowel: There is moderate stool throughout the colon. There is no bowel obstruction or active inflammation. The appendix is normal. Vascular/Lymphatic: Advanced aortoiliac atherosclerotic disease. The IVC is  unremarkable. No portal venous gas. There is no adenopathy. Reproductive: The prostate and seminal vesicles are grossly unremarkable. No pelvic mass. Other: Small fat containing umbilical hernia. Musculoskeletal: Degenerative changes of the spine. No acute osseous pathology. IMPRESSION: 1. No acute intra-abdominal or pelvic pathology. 2. Moderate colonic stool burden. No bowel obstruction. Normal appendix. 3.  Aortic Atherosclerosis (ICD10-I70.0). Electronically Signed   By: Anner Crete M.D.   On: 09/23/2022 21:19   DG Chest Port 1 View  Result Date: 09/23/2022 CLINICAL DATA:  [id number] Weakness [id number] EXAM: PORTABLE CHEST 1 VIEW COMPARISON:  Chest x-ray 06/30/2022 FINDINGS: The heart and mediastinal contours are unchanged. Aortic calcification. Bilateral mid lower lung zone linear atelectasis versus scarring. No focal consolidation. Chronic coarsened markings with no overt pulmonary edema. No pleural effusion. No pneumothorax. No acute osseous abnormality. IMPRESSION: 1. No active disease. 2.  Aortic Atherosclerosis (ICD10-I70.0). Electronically Signed   By: Iven Finn M.D.   On: 09/23/2022 19:18     Data Reviewed: Relevant notes from primary care and specialist visits, past discharge summaries as available in EHR, including Care Everywhere. Prior diagnostic testing as pertinent to current admission diagnoses Updated medications and problem lists for reconciliation ED course, including vitals, labs, imaging, treatment and response to treatment Triage notes, nursing and pharmacy notes and ED provider's notes Notable results as noted in HPI   Assessment and Plan: * Gastroenteritis IV fluids, IV antiemetics and supportive care Clear liquid diet and advance as tolerated Patient states stool is formed but frequent Culture if diarrheal stool  AKI (acute kidney injury) ([place]) Creatinine 1.39, up from baseline of around 1.1 IV hydration and monitor  Non-traumatic rhabdomyolysis CKD open  900 Continue hydration and monitor  High anion gap metabolic acidosis Patient with ketonuria bicarb of 20 and anion gap of 16 Possibly related to dehydration, decreased oral intake, rhabdo Expecting improvement with hydration.  Continue to monitor  Unable to care for self PT and TOC consult  Hx of BKA, left ([place]) Assistance with transfers Fall precautions  Anxiety and depression Continue Lexapro  Chronic diastolic CHF (congestive heart failure) (HCC) Continue spironolactone, Entresto, carvedilol, Lasix  COPD with chronic bronchitis Not acutely exacerbated Continue Advair and albuterol  CAD (coronary artery disease) Continue Coreg, Imdur, carvedilol, atorvastatin and aspirin  Essential hypertension Continue home amlodipine and carvedilol  Type 2 diabetes mellitus with hyperlipidemia (HCC) Sliding scale insulin coverage        DVT prophylaxis: Lovenox  Consults: none  Advance Care Planning:   Code Status: Prior   Family Communication: none  Disposition Plan: Back to previous home environment  Severity of Illness: The appropriate patient status for this patient is OBSERVATION. Observation status is judged to be reasonable and necessary in order to provide the required intensity of service to ensure the patient's safety. The patient's presenting symptoms, physical exam findings, and initial radiographic and laboratory data in the context of their medical condition is felt to place them at decreased risk for further clinical deterioration. Furthermore, it is anticipated that the patient will be medically stable for discharge from the hospital within 2 midnights of admission.   Author: Athena Masse, MD 09/23/2022 10:57 PM  For on call review [email or web address].

## 2022-09-23 NOTE — ED Provider Notes (Signed)
[facility] Provider Note    Event Date/Time   First MD Initiated Contact with Patient 09/23/22 1805     (approximate)   History   Diarrhea and Emesis   HPI  Danny ELSENPETER Sr. is a 72 y.o. male with a history of hypertension, diabetes, COPD, CAD, CHF, and PAD who presents with vomiting, frequent stools, and weakness over the last couple days.  The patient states that he had numerous bowel movements yesterday which were formed, not liquid.  He denies any blood in the stool.  He also had nausea and multilevel episodes of vomiting.  He denies any abdominal pain.  He states that his legs feel weak.  He has a headache as well.    I reviewed the past medical records.  The patient's most recent inpatient admission was in November of last year due to AKI and chest pain.  His most recent outpatient encounter was with Dr. [name] from family medicine on 2/7 for follow-up of his chronic conditions.   Physical Exam   Triage Vital Signs: ED Triage Vitals  Enc Vitals Group     BP 09/23/22 1759 (!) 140/87     Pulse Rate 09/23/22 1759 93     Resp 09/23/22 1759 18     Temp 09/23/22 1759 98 F (36.7 C)     Temp Source 09/23/22 1759 Oral     SpO2 09/23/22 1759 94 %     Weight 09/23/22 1800 230 lb (104.3 kg)     Height 09/23/22 1800 6' (1.829 m)     Head Circumference --      Peak Flow --      Pain Score 09/23/22 1800 3     Pain Loc --      Pain Edu? --      Excl. in [place]? --     Most recent vital signs: Vitals:   09/23/22 2130 09/23/22 2140  BP:    Pulse:    Resp:    Temp:    SpO2: 94% 95%     General: Alert, oriented x 3, weak appearing. CV:  Good peripheral perfusion.  Resp:  Normal effort.  Lungs CTAB. Abd:  Soft with no focal tenderness.  No distention.  Other:  No peripheral edema.  Dry mucous membranes.   ED Results / Procedures / Treatments   Labs (all labs ordered are listed, but only abnormal results are displayed) Labs Reviewed   COMPREHENSIVE METABOLIC PANEL - Abnormal; Notable for the following components:      Result Value   Sodium 132 (*)    Potassium 3.4 (*)    Chloride 96 (*)    CO2 20 (*)    Glucose, Bld 177 (*)    BUN 42 (*)    Creatinine, Ser 1.39 (*)    Calcium 8.7 (*)    AST 54 (*)    GFR, Estimated 54 (*)    Anion gap 16 (*)    All other components within normal limits  CK - Abnormal; Notable for the following components:   Total CK 905 (*)    All other components within normal limits  CBC WITH DIFFERENTIAL/PLATELET - Abnormal; Notable for the following components:   WBC 11.4 (*)    Neutro Abs 9.2 (*)    Monocytes Absolute 1.2 (*)    All other components within normal limits  URINALYSIS, ROUTINE W REFLEX MICROSCOPIC - Abnormal; Notable for the following components:   Color, Urine YELLOW (*)  APPearance CLEAR (*)    Glucose, UA >=500 (*)    Hgb urine dipstick MODERATE (*)    Ketones, ur 20 (*)    Protein, ur 30 (*)    All other components within normal limits  TROPONIN I (HIGH SENSITIVITY) - Abnormal; Notable for the following components:   Troponin I (High Sensitivity) 37 (*)    All other components within normal limits  TROPONIN I (HIGH SENSITIVITY) - Abnormal; Notable for the following components:   Troponin I (High Sensitivity) 35 (*)    All other components within normal limits  RESP PANEL BY RT-PCR (RSV, FLU A&B, COVID)  [id number]  LACTIC ACID, PLASMA  LACTIC ACID, PLASMA  LIPASE, BLOOD     EKG  ED ECG REPORT I, Arta Silence, the attending physician, personally viewed and interpreted this ECG.  Date: 09/23/2022 EKG Time: 1759 Rate: 83 Rhythm: normal sinus rhythm QRS Axis: normal Intervals: normal ST/T Wave abnormalities: normal Narrative Interpretation: no evidence of acute ischemia    RADIOLOGY  Chest x-ray: I independently viewed and interpreted the images; there is no focal consolidation or edema  CT abdomen/pelvis: No acute  abnormality   PROCEDURES:  Critical Care performed: No  Procedures   MEDICATIONS ORDERED IN ED: Medications  sodium chloride 0.9 % bolus 1,000 mL (0 mLs Intravenous Stopped 09/23/22 2134)  iohexol (OMNIPAQUE) 300 MG/ML solution 100 mL (100 mLs Intravenous Contrast Given 09/23/22 2056)  sodium chloride 0.9 % bolus 1,000 mL (1,000 mLs Intravenous New Bag/Given 09/23/22 2224)     IMPRESSION / MDM / [place] / ED COURSE  I reviewed the triage vital signs and the nursing notes.  72 year old male with PMH as noted above presents with vomiting, frequent stools, and generalized weakness over the last couple of days.  The patient arrived with his clothing soaked and stool caked on his groin.  However he is alert and oriented except to the date.  On exam he has dry mucous membranes, a soft and nontender abdomen, and neurologic exam is nonfocal.  Differential diagnosis includes, but is not limited to, gastroenteritis, colitis, diverticulitis, other intra-abdominal infection, UTI or other acute infection/sepsis, less likely primary cardiac etiology.  We will obtain lab workup, chest x-ray, CT abdomen/pelvis, give fluids and reassess.  Patient's presentation is most consistent with acute presentation with potential threat to life or bodily function.  The patient is on the cardiac monitor to evaluate for evidence of arrhythmia and/or significant heart rate changes.  ----------------------------------------- 10:37 PM on 09/23/2022 -----------------------------------------  Lab workup is significant for mild leukocytosis at 11.4, CK of 905 indicating rhabdomyolysis, and mildly elevated troponin most consistent with demand ischemia.  Respiratory panel is negative.  Creatinine is close to the patient's baseline.  Lactate is normal.  Urinalysis shows no evidence of UTI.  Chest x-ray is clear.  CT abdomen/pelvis shows no acute intra-abdominal findings.  Overall I suspect viral GI infection  with subsequent dehydration and weakness and rhabdomyolysis.  The patient will need admission for further workup and management.  I consulted Dr. Damita Dunnings from the hospitalist service; based on our discussion she agrees to admit the patient.   FINAL CLINICAL IMPRESSION(S) / ED DIAGNOSES   Final diagnoses:  Dehydration  Non-traumatic rhabdomyolysis     Rx / DC Orders   ED Discharge Orders     None        Note:  This document was prepared using Dragon voice recognition software and may include unintentional dictation errors.    Chip Canepa,  Felix Ahmadi, MD 09/23/22 2238

## 2022-09-23 NOTE — Assessment & Plan Note (Addendum)
Patient with ketonuria bicarb of 20 and anion gap of 16 on admission and is improving Possibly related to dehydration, decreased oral intake, rhabdo Expecting improvement with hydration.  Continue to monitor

## 2022-09-23 NOTE — Assessment & Plan Note (Addendum)
Continue home amlodipine and carvedilol

## 2022-09-23 NOTE — Assessment & Plan Note (Signed)
Sliding scale insulin coverage 

## 2022-09-23 NOTE — Assessment & Plan Note (Signed)
Not acutely exacerbated Continue Advair and albuterol.

## 2022-09-23 NOTE — ED Triage Notes (Signed)
Patient arrived by EMS from home. Reports he had home health care that quit weeks ago. He reports his son comes to help him some.   Patient arrived saturated in stool and urine. Stool was caked all over patients buttocks and private area. He reports he normally gets up but today didn't feel well and weak.   Below the left knee amputation.   Alert to self, place, and situation. Disoriented to time

## 2022-09-23 NOTE — Assessment & Plan Note (Signed)
Continue Lexapro

## 2022-09-23 NOTE — Assessment & Plan Note (Signed)
PT recommends home health - TOC aware.  They are considering APS referral for home situation or DSS involvement

## 2022-09-23 NOTE — Assessment & Plan Note (Addendum)
Continue spironolactone, Entresto, carvedilol, hold Lasix.

## 2022-09-24 DIAGNOSIS — M6281 Muscle weakness (generalized): Secondary | ICD-10-CM | POA: Diagnosis not present

## 2022-09-24 DIAGNOSIS — N179 Acute kidney failure, unspecified: Secondary | ICD-10-CM

## 2022-09-24 DIAGNOSIS — E860 Dehydration: Secondary | ICD-10-CM | POA: Diagnosis not present

## 2022-09-24 DIAGNOSIS — M6282 Rhabdomyolysis: Secondary | ICD-10-CM | POA: Diagnosis not present

## 2022-09-24 DIAGNOSIS — E8729 Other acidosis: Secondary | ICD-10-CM | POA: Diagnosis not present

## 2022-09-24 DIAGNOSIS — Z789 Other specified health status: Secondary | ICD-10-CM | POA: Diagnosis not present

## 2022-09-24 DIAGNOSIS — I619 Nontraumatic intracerebral hemorrhage, unspecified: Secondary | ICD-10-CM | POA: Diagnosis not present

## 2022-09-24 DIAGNOSIS — K529 Noninfective gastroenteritis and colitis, unspecified: Secondary | ICD-10-CM | POA: Diagnosis not present

## 2022-09-24 LAB — BASIC METABOLIC PANEL
Anion gap: 7 (ref 5–15)
BUN: 36 mg/dL — ABNORMAL HIGH (ref 8–23)
CO2: 21 mmol/L — ABNORMAL LOW (ref 22–32)
Calcium: 8 mg/dL — ABNORMAL LOW (ref 8.9–10.3)
Chloride: 106 mmol/L (ref 98–111)
Creatinine, Ser: 1.1 mg/dL (ref 0.61–1.24)
GFR, Estimated: >60 mL/min (ref >60)
Glucose, Bld: 138 mg/dL — ABNORMAL HIGH (ref 70–99)
Potassium: 3.1 mmol/L — ABNORMAL LOW (ref 3.5–5.1)
Sodium: 134 mmol/L — ABNORMAL LOW (ref 135–145)

## 2022-09-24 LAB — CBC
HCT: 43.5 % (ref 39.0–52.0)
Hemoglobin: 14.3 g/dL (ref 13.0–17.0)
MCH: 31.1 pg (ref 26.0–34.0)
MCHC: 32.9 g/dL (ref 30.0–36.0)
MCV: 94.6 fL (ref 80.0–100.0)
Platelets: 185 10*3/uL (ref 150–400)
RBC: 4.6 MIL/uL (ref 4.22–5.81)
RDW: 12.9 % (ref 11.5–15.5)
WBC: 8.3 10*3/uL (ref 4.0–10.5)
nRBC: 0 % (ref 0.0–0.2)

## 2022-09-24 LAB — C DIFFICILE QUICK SCREEN W PCR REFLEX
C Diff antigen: NEGATIVE
C Diff interpretation: NOT DETECTED
C Diff toxin: NEGATIVE

## 2022-09-24 LAB — GLUCOSE, CAPILLARY
Glucose-Capillary: 177 mg/dL — ABNORMAL HIGH (ref 70–99)
Glucose-Capillary: 247 mg/dL — ABNORMAL HIGH (ref 70–99)
Glucose-Capillary: 152 mg/dL — ABNORMAL HIGH (ref 70–99)
Glucose-Capillary: 178 mg/dL — ABNORMAL HIGH (ref 70–99)
Glucose-Capillary: 207 mg/dL — ABNORMAL HIGH (ref 70–99)

## 2022-09-24 LAB — CK: Total CK: 606 U/L — ABNORMAL HIGH (ref 49–397)

## 2022-09-24 MED ORDER — BISMUTH SUBSALICYLATE 262 MG/15ML PO SUSP
30.0000 mL | Freq: Three times a day (TID) | ORAL | Status: DC
  Administered 2022-09-24 – 2022-09-25 (×3): 30 mL via ORAL
  Filled 2022-09-24: qty 118

## 2022-09-24 MED ORDER — POTASSIUM CHLORIDE CRYS ER 20 MEQ PO TBCR
40.0000 meq | EXTENDED_RELEASE_TABLET | ORAL | Status: AC
  Administered 2022-09-24 (×2): 40 meq via ORAL
  Filled 2022-09-24 (×2): qty 2

## 2022-09-24 MED ORDER — CLOPIDOGREL BISULFATE 75 MG PO TABS
75.0000 mg | ORAL_TABLET | Freq: Every day | ORAL | Status: DC
  Administered 2022-09-24 – 2022-09-25 (×2): 75 mg via ORAL
  Filled 2022-09-24 (×2): qty 1

## 2022-09-24 MED ORDER — CARVEDILOL 25 MG PO TABS
25.0000 mg | ORAL_TABLET | Freq: Two times a day (BID) | ORAL | Status: DC
  Administered 2022-09-24 – 2022-09-25 (×2): 25 mg via ORAL
  Filled 2022-09-24 (×2): qty 1

## 2022-09-24 MED ORDER — HYDROCODONE-ACETAMINOPHEN 5-325 MG PO TABS: 1.0000 | ORAL_TABLET | Freq: Four times a day (QID) | ORAL | Status: DC | PRN

## 2022-09-24 NOTE — Evaluation (Signed)
Physical Therapy Evaluation Patient Details Name: Danny REEDUS Sr. MRN: [id number] DOB: 1950/12/24 Today's Date: 09/24/2022  History of Present Illness  Patient is a 72 year old male presenting with vomiting, frequent bowel movements and weakness and inability to care for self. History of L BKA, hypertension, COPD, CAD, CHF, PAD, diabetes mellitus.  Clinical Impression  Patient is agreeable to PT evaluation. He reports he lives with his son with a ramped entrance and extensive DME in place already, including hospital bed, wheelchair, bed side commode. He reports he has a prosthetic leg that does not fit and he is wheelchair bound at baseline. He has been to SNF in the past following BKA and is not interested in returning at this time.  Today, the patient can mobilize in bed with no physical assistance from therapist today. He performed lateral scooting transfer to the right with supervision and cues for technique. He declined getting OOB to the recliner but reports feeling confident that he can get to his wheelchair if needed. Increased time and effort required with mobility and patient does appear to have generalized weakness. He does not want SNF at discharge. Recommend HHPT with intermittent family support. PT will continue to follow to maximize independence and decrease caregiver burden.      Recommendations for follow up therapy are one component of a multi-disciplinary discharge planning process, led by the attending physician.  Recommendations may be updated based on patient status, additional functional criteria and insurance authorization.  Follow Up Recommendations Home health PT      Assistance Recommended at Discharge Intermittent Supervision/Assistance  Patient can return home with the following  A little help with walking and/or transfers;A little help with bathing/dressing/bathroom;Help with stairs or ramp for entrance;Assist for transportation    Equipment Recommendations None  recommended by PT  Recommendations for Other Services       Functional Status Assessment Patient has had a recent decline in their functional status and demonstrates the ability to make significant improvements in function in a reasonable and predictable amount of time.     Precautions / Restrictions Precautions Precautions: Fall Restrictions Weight Bearing Restrictions: No      Mobility  Bed Mobility Overal bed mobility: Needs Assistance Bed Mobility: Supine to Sit, Sit to Supine     Supine to sit: Min guard Sit to supine: Supervision   General bed mobility comments: increased time, no physical assistance required    Transfers Overall transfer level: Needs assistance   Transfers: Bed to chair/wheelchair/BSC            Lateral/Scoot Transfers: Supervision General transfer comment: patient performed right lateral scooting transfer along edge of bed with supervision, cues for technique. he declined getting up to the chair and reports he feels confident he could get into his wheelchair if he had to    Ambulation/Gait               General Gait Details: patient is non ambulatory at baseline, uses wheelchair as primary means of mobility  Stairs            Wheelchair Mobility    Modified Rankin (Stroke Patients Only)       Balance Overall balance assessment: Needs assistance Sitting-balance support: Feet supported Sitting balance-[name]: Good                                       Pertinent Vitals/Pain  Pain Assessment Pain Assessment: No/denies pain    Home Living Family/patient expects to be discharged to:: Private residence Living Arrangements: Children (son) Available Help at Discharge: Family;Available PRN/intermittently Type of Home: House Home Access: Ramped entrance       Home Layout: One level Home Equipment: Rolling Walker (2 wheels);BSC/3in1;Tub bench;Wheelchair - manual;Hospital bed Additional Comments: son  gets groceries. patient has a prosthesis that he reports does not fit    Prior Function Prior Level of Function : Independent/Modified Independent             Mobility Comments: Mod I at wheelchair level. squat pivot into wheelchair. has fallen out of wheelchair in the past ADLs Comments: unable to get into bathroom with his wheelchair. he reports having bowel movements in the bed and cleaning himself up from the bed level. has a bed side commode and shower chair but does not use them     Hand Dominance        Extremity/Trunk Assessment   Upper Extremity Assessment Upper Extremity Assessment: Generalized weakness    Lower Extremity Assessment Lower Extremity Assessment: Generalized weakness (L BKA; distal RLE is hypersensitive to light touch)       Communication   Communication: No difficulties  Cognition Arousal/Alertness: Awake/alert Behavior During Therapy: WFL for tasks assessed/performed Overall Cognitive Status: Within Functional Limits for tasks assessed                                          General Comments General comments (skin integrity, edema, etc.): patient reports feeling generalized weakness. he wants to return home and is declining to go to SNF at discharge    Exercises     Assessment/Plan    PT Assessment Patient needs continued PT services  PT Problem List Decreased strength;Decreased activity tolerance;Decreased balance;Decreased mobility       PT Treatment Interventions DME instruction;Functional mobility training;Therapeutic activities;Therapeutic exercise;Balance training;Neuromuscular re-education;Patient/family education;Wheelchair mobility training    PT Goals (Current goals can be found in the Care Plan section)  Acute Rehab PT Goals Patient Stated Goal: to go home soon PT Goal Formulation: With patient Time For Goal Achievement: 10/08/22 Potential to Achieve Goals: Good Additional Goals Additional Goal #1:  patient will be Mod I for wheelchair propulsion 293f in preparation for home and community mobility    Frequency Min 2X/week     Co-evaluation               AM-PAC PT "6 Clicks" Mobility  Outcome Measure Help needed turning from your back to your side while in a flat bed without using bedrails?: None Help needed moving from lying on your back to sitting on the side of a flat bed without using bedrails?: A Little Help needed moving to and from a bed to a chair (including a wheelchair)?: A Little Help needed standing up from a chair using your arms (e.g., wheelchair or bedside chair)?: A Little Help needed to walk in hospital room?: Total Help needed climbing 3-5 steps with a railing? : Total 6 Click Score: 15    End of Session   Activity Tolerance: Patient tolerated treatment well Patient left: in bed;with call bell/phone within reach;with bed alarm set   PT Visit Diagnosis: Muscle weakness (generalized) (M62.81);Unsteadiness on feet (R26.81)    Time: [id number] Time Calculation (min) (ACUTE ONLY): 24 min   Charges:   PT Evaluation $PT Eval  Low Complexity: 1 Low PT Treatments $Therapeutic Activity: 8-22 mins      [name], PT, MPT   Percell Locus 09/24/2022, 9:31 AM

## 2022-09-24 NOTE — Progress Notes (Signed)
Anticoagulation monitoring(Lovenox):  72 yo male ordered Lovenox 40 mg Q24h    Filed Weights   09/23/22 1800  Weight: 104.3 kg (230 lb)   BMI 31   Lab Results  Component Value Date   CREATININE 1.39 (H) 09/23/2022   CREATININE 1.32 09/12/2022   CREATININE 1.15 08/02/2022   Estimated Creatinine Clearance: 60.9 mL/min (A) (by C-G formula based on SCr of 1.39 mg/dL (H)). Hemoglobin & Hematocrit     Component Value Date/Time   HGB 15.6 09/23/2022 1931   HGB 15.1 11/23/2014 2006   HCT 48.0 09/23/2022 1931   HCT 46.1 11/23/2014 2006     Per Protocol for Patient with estCrcl > 30 ml/min and BMI > 30, will transition to Lovenox 52.5 mg Q24h.

## 2022-09-24 NOTE — Assessment & Plan Note (Signed)
Chronic and stable.

## 2022-09-24 NOTE — Progress Notes (Signed)
Progress Note   Patient: Danny HENSHAW Sr. [id number] DOB: Nov 01, 1950 DOA: 09/23/2022     0 DOS: the patient was seen and examined on 09/24/2022   Brief hospital course: 72 y.o. male with medical history significant for hypertension, diabetes mellitus, COPD, hypertension, CAD, chronic systolic CHF, and PAD s/p  left BKA , last hospitalized in November 2023 with chest pain and AKI who presents to the ED with vomiting, frequent bowel movements and weakness and inability to care for self   2/19: Stool for C. difficile negative.  Respiratory virus panel negative.  PT recommends home health, blood pressure trending up  Assessment and Plan: * Gastroenteritis Continue IV fluids, IV antiemetics and supportive care Will advance diet Patient states stool is formed and so far has had only 2 bowel movement Stool for C. difficile negative.  No further vomiting while here in the hospital  AKI (acute kidney injury) ([place]) Creatinine 1.39, up from baseline of around 1.1.  Now improved with hydration Continue IV hydration and monitor  Lab Results  Component Value Date   CREATININE 1.10 09/24/2022   CREATININE 1.39 (H) 09/23/2022   CREATININE 1.32 09/12/2022     Non-traumatic rhabdomyolysis CK > 900, recheck Continue hydration and monitor  High anion gap metabolic acidosis Patient with ketonuria bicarb of 20 and anion gap of 16 on admission and is improving Possibly related to dehydration, decreased oral intake, rhabdo Expecting improvement with hydration.  Continue to monitor  Unable to care for self PT recommends home health - TOC aware.  They are considering APS referral for home situation or DSS involvement  Hx of BKA, left ([place]) Assistance with transfers Fall precautions.  Coronary artery disease of native artery of native heart with stable angina pectoris (HCC) Chronic and stable  Anxiety and depression Continue Lexapro.  Chronic diastolic CHF (congestive heart failure)  (HCC) Continue spironolactone, Entresto, carvedilol, hold Lasix.  COPD with chronic bronchitis Not acutely exacerbated Continue Advair and albuterol.  CAD (coronary artery disease) Continue Coreg, Imdur, carvedilol, atorvastatin and aspirin  Essential hypertension Blood pressure trending up.  Continue amlodipine increase the dose of Coreg from 12.5 to 25 mg  Type 2 diabetes mellitus with hyperlipidemia (HCC) Continue sliding scale insulin coverage  Hypokalemia Replete and recheck, pharmacy managing this  Home situation Daughter was concerned that patient's living with son and son is not taking care of him Medications are not being appropriately given accordingly to daughter Pharmacy to resume baby aspirin and Flomax as those are patient's home medicines TOC aware and considering involvement of APS/DSS referral       Subjective: Patient requesting to get him home soon.  Wants to eat  Physical Exam: Vitals:   09/23/22 2241 09/24/22 0004 09/24/22 0441 09/24/22 0839  BP:  (!) 147/96 (!) 159/87 (!) 157/92  Pulse:  93 95 86  Resp:  18 16 20  $ Temp: 98.2 F (36.8 C) 98.2 F (36.8 C) 99 F (37.2 C) 98 F (36.7 C)  TempSrc: Oral   Oral  SpO2:  94% 95% 93%  Weight:  100.7 kg    Height:  6' (1.829 m)     Constitutional:      General: He is not in acute distress. HENT:     Head: Normocephalic and atraumatic.  Cardiovascular:     Rate and Rhythm: Normal rate and regular rhythm.     Heart sounds: Normal heart sounds.  Pulmonary:     Effort: Pulmonary effort is normal.  Breath sounds: Normal breath sounds.  Abdominal:     Palpations: Abdomen is soft.     Tenderness: There is no abdominal tenderness.  Musculoskeletal:     Comments: Left BKA  Neurological:     Mental Status: Mental status is at baseline.  Data Reviewed:  Potassium 3.1, CK 905  Family Communication: Patient's daughter was updated over phone  Disposition: Status is: Observation The patient  remains OBS appropriate and will d/c before 2 midnights.  Planned Discharge Destination: Home with Home Health   DVT prophylaxis-Lovenox Time spent: 35 minutes  Author: Max Sane, MD 09/24/2022 2:45 PM  For on call review [email or web address].

## 2022-09-24 NOTE — Hospital Course (Addendum)
72 y.o. male with medical history significant for hypertension, diabetes mellitus, COPD, hypertension, CAD, chronic systolic CHF, and PAD s/p  left BKA , last hospitalized in November 2023 with chest pain and AKI who presents to the ED with vomiting, frequent bowel movements and weakness and inability to care for self   2/19: Stool for C. difficile negative.  Respiratory virus panel negative.  PT recommends home health, blood pressure trending up

## 2022-09-24 NOTE — TOC Initial Note (Signed)
Transition of Care (TOC) - Initial/Assessment Note    Patient Details  Name: Danny FICKERT Sr. MRN: [id number] Date of Birth: May 27, 1951  Transition [facility]) CM/SW Contact:    Gerilyn Pilgrim, LCSW Phone Number: 09/24/2022, 11:34 AM  Clinical Narrative:   CSW spoke with pt sister regarding care at home. Sister states she is not involved and that her brother is supposed to be the one providing care. Sister states brother is never able to assist and she is never able to get in touch with him. She reports she has to call her cousin to get in touch with her brother due to him being so unavalaible. Sister states her brother is supposed to be staying with pt but reports that has not been happening. [facility] was in with patient and reportedly called APS due to no caregiver but I am unsure if it was screened in or not. CSW left voicemail for APS. Sister reports that pt gets very weak and will just use the bathroom on himself due to not being able to get up to the [facility]. Sister reports that no one else is able to assist the patient at home and that the pt's cousin will take him to doctors appointments. Pt was covered in feces and urine on admission. CSW will wait for DSS to call regarding pt.                  Expected Discharge Plan: [place] Barriers to Discharge: Continued Medical Work up   Patient Goals and CMS Choice            Expected Discharge Plan and Services       Living arrangements for the past 2 months: Single Family Home                                      Prior Living Arrangements/Services Living arrangements for the past 2 months: Single Family Home Lives with:: Self                   Activities of Daily Living Home Assistive Devices/Equipment: Wheelchair, Prosthesis ADL Screening (condition at time of admission) Patient's cognitive ability adequate to safely complete daily activities?: Yes Is the patient deaf or have  difficulty hearing?: No Does the patient have difficulty seeing, even when wearing glasses/contacts?: No Does the patient have difficulty concentrating, remembering, or making decisions?: No Patient able to express need for assistance with ADLs?: Yes Does the patient have difficulty dressing or bathing?: Yes Independently performs ADLs?: No Communication: Independent Dressing (OT): Independent Grooming: Needs assistance Feeding: Independent with device (comment) Bathing: Needs assistance Toileting: Needs assistance In/Out Bed: Needs assistance Walks in Home: Dependent Is this a change from baseline?: Pre-admission baseline Does the patient have difficulty walking or climbing stairs?: Yes Weakness of Legs: Both Weakness of Arms/Hands: None  Permission Sought/Granted                  Emotional Assessment              Admission diagnosis:  Dehydration [E86.0] Gastroenteritis [K52.9] Non-traumatic rhabdomyolysis [M62.82] Patient Active Problem List   Diagnosis Date Noted   Gastroenteritis 09/23/2022   Unable to care for self 09/23/2022   High anion gap metabolic acidosis 123456   Non-traumatic rhabdomyolysis 09/23/2022   Candidal intertrigo 05/11/2022   Rib pain on left side 12/18/2021  ICH (intracerebral hemorrhage) ([place]) 09/30/2021   Irregular heart beat 01/24/2021   Hx of BKA, left ([place]) 12/16/2020   Reactive thrombocytosis 123456   Chronic systolic heart failure (HCC)    Cellulitis of left foot 11/09/2020   HFrEF (heart failure with reduced ejection fraction) ([place])    Thrush    PVD (peripheral vascular disease) ([place])    Stage 2 chronic kidney disease    Atherosclerosis of native arteries of the extremities with gangrene ([place]) 10/28/2020   Low back pain 06/29/2020   Onychomycosis 03/29/2020   Fall 03/29/2020   AKI (acute kidney injury) ([place]) 12/28/2019   Coronary artery disease of native artery of native heart with stable angina pectoris ([place])  12/12/2018   Pure hypercholesterolemia 12/12/2018   Chest pain 10/31/2018   Atherosclerosis of native arteries of extremity with intermittent claudication ([place]) 02/11/2018   Decreased pedal pulses 07/02/2017   Orthostasis 05/07/2017   Anxiety and depression 05/07/2017   CKD (chronic kidney disease) stage 3, GFR 30-59 ml/min (HCC) 03/06/2017   Diabetic neuropathy ([place]) 03/06/2017   Chronic diastolic CHF (congestive heart failure) ([place]) 03/05/2017   Morbid obesity ([place]) 06/18/2016   H/O medication noncompliance 06/18/2016   COPD with chronic bronchitis 06/20/2015   GERD (gastroesophageal reflux disease) 12/20/2014   Syncope 12/13/2014   Type 2 diabetes mellitus with hyperlipidemia ([place]) 06/05/2010   Hyperlipidemia 02/10/2010   Essential hypertension 02/10/2010   CAD (coronary artery disease) 02/10/2010   PCP:  [name], MD Pharmacy:   [facility] [place] [address] Phone: [phone] Fax: [phone]  [id number] [place] [place] [address] [place]: [phone]: [address] [place] - [address]Phone: [phone]: [address] [address]Phone: [phone]: [phone]    Social Determinants of Health (SDOH) [place] No Food Insecurity (12/12/2021)  Housing: Low Risk  (12/12/2021)  Transportation Needs: No Transportation Needs (12/12/2021)  Depression (PHQ2-9): Low Risk  (09/12/2022)  Financial Resource Strain: Low Risk  (12/12/2021)  Physical Activity: Unknown (12/12/2021)  Social Connections: Unknown (12/12/2021)  Stress: No Stress Concern Present (12/12/2021)  Tobacco Use: Medium Risk (09/23/2022)   SDOH Interventions:      Readmission Risk Interventions     No data to display

## 2022-09-25 DIAGNOSIS — Z789 Other specified health status: Secondary | ICD-10-CM | POA: Diagnosis not present

## 2022-09-25 DIAGNOSIS — M6282 Rhabdomyolysis: Secondary | ICD-10-CM | POA: Diagnosis not present

## 2022-09-25 DIAGNOSIS — Z743 Need for continuous supervision: Secondary | ICD-10-CM | POA: Diagnosis not present

## 2022-09-25 DIAGNOSIS — Z7401 Bed confinement status: Secondary | ICD-10-CM | POA: Diagnosis not present

## 2022-09-25 DIAGNOSIS — E8729 Other acidosis: Secondary | ICD-10-CM | POA: Diagnosis not present

## 2022-09-25 DIAGNOSIS — K529 Noninfective gastroenteritis and colitis, unspecified: Secondary | ICD-10-CM | POA: Diagnosis not present

## 2022-09-25 DIAGNOSIS — E860 Dehydration: Secondary | ICD-10-CM | POA: Diagnosis not present

## 2022-09-25 DIAGNOSIS — R6889 Other general symptoms and signs: Secondary | ICD-10-CM | POA: Diagnosis not present

## 2022-09-25 LAB — BASIC METABOLIC PANEL
Anion gap: 6 (ref 5–15)
BUN: 28 mg/dL — ABNORMAL HIGH (ref 8–23)
CO2: 22 mmol/L (ref 22–32)
Calcium: 8.2 mg/dL — ABNORMAL LOW (ref 8.9–10.3)
Chloride: 108 mmol/L (ref 98–111)
Creatinine, Ser: 1.04 mg/dL (ref 0.61–1.24)
GFR, Estimated: >60 mL/min (ref >60)
Glucose, Bld: 145 mg/dL — ABNORMAL HIGH (ref 70–99)
Potassium: 3.4 mmol/L — ABNORMAL LOW (ref 3.5–5.1)
Sodium: 136 mmol/L (ref 135–145)

## 2022-09-25 LAB — CBC
HCT: 43.8 % (ref 39.0–52.0)
Hemoglobin: 14.2 g/dL (ref 13.0–17.0)
MCH: 30.5 pg (ref 26.0–34.0)
MCHC: 32.4 g/dL (ref 30.0–36.0)
MCV: 94.2 fL (ref 80.0–100.0)
Platelets: 197 10*3/uL (ref 150–400)
RBC: 4.65 MIL/uL (ref 4.22–5.81)
RDW: 13 % (ref 11.5–15.5)
WBC: 7 10*3/uL (ref 4.0–10.5)
nRBC: 0 % (ref 0.0–0.2)

## 2022-09-25 LAB — GLUCOSE, CAPILLARY
Glucose-Capillary: 192 mg/dL — ABNORMAL HIGH (ref 70–99)
Glucose-Capillary: 219 mg/dL — ABNORMAL HIGH (ref 70–99)

## 2022-09-25 LAB — CK: Total CK: 225 U/L (ref 49–397)

## 2022-09-25 MED ORDER — LOPERAMIDE HCL 2 MG PO TABS: 2.0000 mg | ORAL_TABLET | Freq: Four times a day (QID) | ORAL | 0 refills | Status: DC | PRN

## 2022-09-25 MED ORDER — BISMUTH SUBSALICYLATE 262 MG/15ML PO SUSP: 30.0000 mL | Freq: Three times a day (TID) | ORAL | 0 refills | Status: DC

## 2022-09-25 MED ORDER — AMLODIPINE BESYLATE 10 MG PO TABS
10.0000 mg | ORAL_TABLET | Freq: Every day | ORAL | Status: DC
  Administered 2022-09-25: 10 mg via ORAL
  Filled 2022-09-25: qty 1

## 2022-09-25 MED ORDER — AMLODIPINE BESYLATE 10 MG PO TABS: 10.0000 mg | ORAL_TABLET | Freq: Every day | ORAL | 0 refills | Status: DC

## 2022-09-25 MED ORDER — POTASSIUM CHLORIDE CRYS ER 20 MEQ PO TBCR
40.0000 meq | EXTENDED_RELEASE_TABLET | ORAL | Status: DC
  Administered 2022-09-25: 40 meq via ORAL
  Filled 2022-09-25: qty 2

## 2022-09-25 MED ORDER — CARVEDILOL 25 MG PO TABS: 25.0000 mg | ORAL_TABLET | Freq: Two times a day (BID) | ORAL | 0 refills | Status: DC

## 2022-09-25 NOTE — TOC Progression Note (Addendum)
Transition of Care (TOC) - Progression Note    Patient Details  Name: Danny [name] Sr. MRN: [id number] Date of Birth: [birth year]  Transition [facility] Care [facility]) CM/SW Contact  Laurena Slimmer, RN Phone Number: 09/25/2022, 10:38 AM  Clinical Narrative:    Patient was previously active with [facility]. Patient is not able to be accepted back  at [facility] due to not having a caregiver and care being more custodial.   Spoke with patient's daughter, [name]. She stated she was unable to pick patient up. Patient's son is working. [name] stated her brother cares for patient but not as he should. APS was called last year and patient was assigned a Education officer, museum.   Attempt to reach patient's son Sabino at [phone]. No answer. Unable to leave a message.   Referral for [facility] sent to [facility] at [place].   Spoke with patient EMS requested EMS arranged    Expected Discharge Plan: [place] Barriers to Discharge: Continued Medical Work up  Expected Discharge Plan and Services       Living arrangements for the past 2 months: Single Family Home Expected Discharge Date: 09/25/22                                     Social Determinants of Health (SDOH) Interventions SDOH Screenings   Food Insecurity: No Food Insecurity (12/12/2021)  Housing: Low Risk  (12/12/2021)  Transportation Needs: No Transportation Needs (12/12/2021)  Depression (PHQ2-9): Low Risk  (09/12/2022)  Financial Resource Strain: Low Risk  (12/12/2021)  Physical Activity: Unknown (12/12/2021)  Social Connections: Unknown (12/12/2021)  Stress: No Stress Concern Present (12/12/2021)  Tobacco Use: Medium Risk (09/23/2022)    Readmission Risk Interventions     No data to display

## 2022-09-25 NOTE — Discharge Summary (Signed)
Physician Discharge Summary   Patient: Danny DUMAS Sr. MRN: [id number] DOB: 06-19-1951  Admit date:     09/23/2022  Discharge date: 09/25/22  Discharge Physician: Max Sane   PCP: [name], MD   Recommendations at discharge:   Follow-up with outpatient providers as requested  Discharge Diagnoses: Principal Problem:   Gastroenteritis Active Problems:   AKI (acute kidney injury) ([place])   High anion gap metabolic acidosis   Non-traumatic rhabdomyolysis   Unable to care for self   Type 2 diabetes mellitus with hyperlipidemia (HCC)   Essential hypertension   CAD (coronary artery disease)   COPD with chronic bronchitis   Chronic diastolic CHF (congestive heart failure) (HCC)   Anxiety and depression   Coronary artery disease of native artery of native heart with stable angina pectoris (HCC)   Hx of BKA, left (HCC)   Dehydration  Hospital Course: 72 y.o. male with medical history significant for hypertension, diabetes mellitus, COPD, hypertension, CAD, chronic systolic CHF, and PAD s/p  left BKA , last hospitalized in November 2023 with chest pain and AKI who presents to the ED with vomiting, frequent bowel movements and weakness and inability to care for self   2/19: Stool for C. difficile negative.  Respiratory virus panel negative.  PT recommends home health, blood pressure trending up  Assessment and Plan: * Gastroenteritis Resolved with symptomatic treatment. Patient states stool is formed and so far has had only 2 bowel movement Stool for C. difficile negative.  No further vomiting while here in the hospital Patient tolerating diet  AKI (acute kidney injury) ([place]) Creatinine 1.39, up from baseline of around 1.1.  Now improved with hydration Improved with hydration  Lab Results  Component Value Date   CREATININE 1.10 09/24/2022   CREATININE 1.39 (H) 09/23/2022   CREATININE 1.32 09/12/2022     Non-traumatic rhabdomyolysis CK > 900 -> 225 Improved with  hydration  High anion gap metabolic acidosis Patient with ketonuria bicarb of 20 and anion gap of 16 on admission and is improving Possibly related to dehydration, decreased oral intake, rhabdo Resolved with hydration  Unable to care for self PT recommends home health -patient has been set up.  And discussion with patient daughter who is in agreement with home with home health.  She is concerned about him being taken care of by her brother although father disputes that and shared with me that brother and sister do not get along.  Hx of BKA, left ([place]) Coronary artery disease of native artery of native heart with stable angina pectoris (HCC) Anxiety and depression Chronic diastolic CHF (congestive heart failure) ([place]) COPD with chronic bronchitis CAD (coronary artery disease) Essential hypertension Type 2 diabetes mellitus with hyperlipidemia (HCC)          Disposition: Home health Diet recommendation:  Discharge Diet Orders (From admission, onward)     Start     Ordered   09/25/22 0000  Diet - low sodium heart healthy        09/25/22 0957           Carb modified diet DISCHARGE MEDICATION: Allergies as of 09/25/2022       Reactions   Metformin And Related Diarrhea   If he takes it twice a day it causes diarhea, so he reduced to once daily        Medication List     STOP taking these medications    doxycycline 100 MG tablet Commonly known as: VIBRA-TABS   Entresto  49-51 MG Generic drug: sacubitril-valsartan   furosemide 20 MG tablet Commonly known as: LASIX   gabapentin 300 MG capsule Commonly known as: Neurontin   HYDROcodone-acetaminophen 5-325 MG tablet Commonly known as: NORCO/VICODIN   isosorbide mononitrate 30 MG 24 hr tablet Commonly known as: IMDUR   ondansetron 4 MG disintegrating tablet Commonly known as: ZOFRAN-ODT   ondansetron 4 MG tablet Commonly known as: ZOFRAN   predniSONE 20 MG tablet Commonly known as: DELTASONE        TAKE these medications    acetaminophen 325 MG tablet Commonly known as: TYLENOL Take 2 tablets (650 mg total) by mouth every 4 (four) hours as needed for mild pain (or temp > 37.5 C (99.5 F)).   albuterol (2.5 MG/3ML) 0.083% nebulizer solution Commonly known as: PROVENTIL Take 3 mLs (2.5 mg total) by nebulization every 6 (six) hours as needed for wheezing or shortness of breath.   ProAir RespiClick [id number] (90 Base) MCG/ACT Aepb Generic drug: Albuterol Sulfate Inhale 1 puff into the lungs every 6 (six) hours as needed.   amLODipine 10 MG tablet Commonly known as: NORVASC Take 1 tablet (10 mg total) by mouth daily. Start taking on: September 26, 2022 What changed:  medication strength how much to take   aspirin EC 81 MG tablet Take 1 tablet (81 mg total) by mouth daily.   atorvastatin 80 MG tablet Commonly known as: LIPITOR Take 1 tablet (80 mg total) by mouth daily.   BD Pen Needle Micro U/F 32G X 6 MM Misc Generic drug: Insulin Pen Needle 1 EACH BY DOES NOT APPLY ROUTE 2 (TWO) TIMES DAILY.   bismuth subsalicylate [id number] suspension Commonly known as: PEPTO BISMOL Take 30 mLs by mouth 4 (four) times daily -  before meals and at bedtime.   butalbital-acetaminophen-caffeine 50-325-40 MG tablet Commonly known as: FIORICET Take 1 tablet by mouth every 8 (eight) hours as needed for headache.   carvedilol 25 MG tablet Commonly known as: COREG Take 1 tablet (25 mg total) by mouth 2 (two) times daily with a meal. What changed:  medication strength how much to take   clopidogrel 75 MG tablet Commonly known as: Plavix Take 1 tablet (75 mg total) by mouth daily.   empagliflozin 25 MG Tabs tablet Commonly known as: Jardiance Take 1 tablet (25 mg total) by mouth daily.   escitalopram 10 MG tablet Commonly known as: LEXAPRO Take 1 tablet (10 mg total) by mouth daily.   fluticasone-salmeterol 250-50 MCG/ACT Aepb Commonly known as: ADVAIR Inhale 1 puff into the lungs  2 (two) times daily.   FreeStyle Libre 2 Reader Devi 1 Device by Does not apply route 4 (four) times daily.   FreeStyle Libre 2 Sensor Misc USE 1 SENSOR EVERY 14 DAYS   insulin lispro 100 UNIT/ML KwikPen Commonly known as: HumaLOG KwikPen Inject 0-15 Units into the skin 4 (four) times daily -  before meals and at bedtime. CBG < 70: Implement Hypoglycemia Standing Orders and refer to Hypoglycemia Standing Orders sidebar report CBG 70 - 120: 0 units CBG 121 - 150: 2 units CBG 151 - 200: 3 units CBG 201 - 250: 5 units CBG 251 - 300: 8 units CBG 301 - 350: 11 units CBG 351 - 400: 15 units CBG > 400: call MD   loperamide 2 MG tablet Commonly known as: Imodium A-D Take 1 tablet (2 mg total) by mouth 4 (four) times daily as needed for diarrhea or loose stools.   nitroGLYCERIN 0.4 MG SL  tablet Commonly known as: NITROSTAT Place 0.4 mg under the tongue every 5 (five) minutes as needed for chest pain.   nystatin ointment Commonly known as: MYCOSTATIN APPLY TO AFFECTED AREA TWICE A DAY   PROCare Adult Briefs X-Large Misc 1 Application by Does not apply route as needed (For incontinence).   spironolactone 25 MG tablet Commonly known as: ALDACTONE TAKE 1 TABLET (25 MG TOTAL) BY MOUTH DAILY.   tamsulosin 0.4 MG Caps capsule Commonly known as: FLOMAX TAKE 1 CAPSULE BY MOUTH EVERY DAY   tirzepatide 5 MG/0.5ML Pen Commonly known as: MOUNJARO Inject 5 mg into the skin once a week. Start after you finish the 28 day course of the 2.5 mg dose. What changed: Another medication with the same name was removed. Continue taking this medication, and follow the directions you see here.   Trulicity 4.5 0000000 Sopn Generic drug: Dulaglutide SMARTSIG:4.5 Milligram(s) SUB-Q Once a Week        Follow-up Information     [name], MD. Schedule an appointment as soon as possible for a visit in 3 day(s).   Specialty: Family Medicine Why: Patient to make own appointment Contact  information: [address] [place] [address] [phone]                Discharge Exam: Danley Danker Weights   09/23/22 1800 09/24/22 0004  Weight: 104.3 kg 100.7 kg   Constitutional:      General: He is not in acute distress. HENT:     Head: Normocephalic and atraumatic.  Cardiovascular:     Rate and Rhythm: Normal rate and regular rhythm.     Heart sounds: Normal heart sounds.  Pulmonary:     Effort: Pulmonary effort is normal.     Breath sounds: Normal breath sounds.  Abdominal:     Palpations: Abdomen is soft.     Tenderness: There is no abdominal tenderness.  Musculoskeletal:     Comments: Left BKA  Neurological:     Mental Status: Mental status is at baseline.   Condition at discharge: fair  The results of significant diagnostics from this hospitalization (including imaging, microbiology, ancillary and laboratory) are listed below for reference.   Imaging Studies: CT ABDOMEN PELVIS W CONTRAST  Result Date: 09/23/2022 CLINICAL DATA:  Abdominal pain. EXAM: CT ABDOMEN AND PELVIS WITH CONTRAST TECHNIQUE: Multidetector CT imaging of the abdomen and pelvis was performed using the standard protocol following bolus administration of intravenous contrast. RADIATION DOSE REDUCTION: This exam was performed according to the departmental dose-optimization program which includes automated exposure control, adjustment of the mA and/or kV according to patient size and/or use of iterative reconstruction technique. CONTRAST:  141m OMNIPAQUE IOHEXOL 300 MG/ML  SOLN COMPARISON:  CT dated 01/08/2022. FINDINGS: Lower chest: There is emphysematous changes of the lungs. Left lung base linear atelectasis/scarring. The visualized lung bases are otherwise clear. There is 3 vessel coronary vascular calcification. No intra-abdominal free air or free fluid. Hepatobiliary: The liver is unremarkable. No biliary dilatation. The gallbladder is unremarkable. Pancreas: Unremarkable. No  pancreatic ductal dilatation or surrounding inflammatory changes. Spleen: Normal in size without focal abnormality. Adrenals/Urinary Tract: The adrenal glands unremarkable. There is a small left renal upper pole cyst. No imaging follow-up. There is no hydronephrosis on either side. There is symmetric enhancement and excretion of contrast by both kidneys. The visualized ureters and urinary bladder appear unremarkable. Stomach/Bowel: There is moderate stool throughout the colon. There is no bowel obstruction or active inflammation. The appendix is  normal. Vascular/Lymphatic: Advanced aortoiliac atherosclerotic disease. The IVC is unremarkable. No portal venous gas. There is no adenopathy. Reproductive: The prostate and seminal vesicles are grossly unremarkable. No pelvic mass. Other: Small fat containing umbilical hernia. Musculoskeletal: Degenerative changes of the spine. No acute osseous pathology. IMPRESSION: 1. No acute intra-abdominal or pelvic pathology. 2. Moderate colonic stool burden. No bowel obstruction. Normal appendix. 3.  Aortic Atherosclerosis (ICD10-I70.0). Electronically Signed   By: Anner Crete M.D.   On: 09/23/2022 21:19   DG Chest Port 1 View  Result Date: 09/23/2022 CLINICAL DATA:  [id number] Weakness [id number] EXAM: PORTABLE CHEST 1 VIEW COMPARISON:  Chest x-ray 06/30/2022 FINDINGS: The heart and mediastinal contours are unchanged. Aortic calcification. Bilateral mid lower lung zone linear atelectasis versus scarring. No focal consolidation. Chronic coarsened markings with no overt pulmonary edema. No pleural effusion. No pneumothorax. No acute osseous abnormality. IMPRESSION: 1. No active disease. 2.  Aortic Atherosclerosis (ICD10-I70.0). Electronically Signed   By: Iven Finn M.D.   On: 09/23/2022 19:18    Microbiology: Results for orders placed or performed during the hospital encounter of 09/23/22  Resp panel by RT-PCR (RSV, Flu A&B, Covid) Anterior Nasal Swab     Status: None    Collection Time: 09/23/22  7:31 PM   Specimen: Anterior Nasal Swab  Result Value Ref Range Status   SARS Coronavirus 2 by RT PCR NEGATIVE NEGATIVE Final    Comment: (NOTE) SARS-CoV-2 target nucleic acids are NOT DETECTED.  The SARS-CoV-2 RNA is generally detectable in upper respiratory specimens during the acute phase of infection. The lowest concentration of SARS-CoV-2 viral copies this assay can detect is 138 copies/mL. A negative result does not preclude SARS-Cov-2 infection and should not be used as the sole basis for treatment or other patient management decisions. A negative result may occur with  improper specimen collection/handling, submission of specimen other than nasopharyngeal swab, presence of viral mutation(s) within the areas targeted by this assay, and inadequate number of viral copies(<138 copies/mL). A negative result must be combined with clinical observations, patient history, and epidemiological information. The expected result is Negative.  Fact Sheet for Patients:  EntrepreneurPulse.com.au  Fact Sheet for Healthcare Providers:  IncredibleEmployment.be  This test is no t yet approved or cleared by the [place] FDA and  has been authorized for detection and/or diagnosis of SARS-CoV-2 by FDA under an Emergency Use Authorization (EUA). This EUA will remain  in effect (meaning this test can be used) for the duration of the COVID-19 declaration under Section 564(b)(1) of the Act, 21 U.S.C.section 360bbb-3(b)(1), unless the authorization is terminated  or revoked sooner.       Influenza A by PCR NEGATIVE NEGATIVE Final   Influenza B by PCR NEGATIVE NEGATIVE Final    Comment: (NOTE) The Xpert Xpress SARS-CoV-2/FLU/RSV plus assay is intended as an aid in the diagnosis of influenza from Nasopharyngeal swab specimens and should not be used as a sole basis for treatment. Nasal washings and aspirates are unacceptable for  Xpert Xpress SARS-CoV-2/FLU/RSV testing.  Fact Sheet for Patients: EntrepreneurPulse.com.au  Fact Sheet for Healthcare Providers: IncredibleEmployment.be  This test is not yet approved or cleared by the [place] FDA and has been authorized for detection and/or diagnosis of SARS-CoV-2 by FDA under an Emergency Use Authorization (EUA). This EUA will remain in effect (meaning this test can be used) for the duration of the COVID-19 declaration under Section 564(b)(1) of the Act, 21 U.S.C. section 360bbb-3(b)(1), unless the authorization is terminated or revoked.  Resp Syncytial Virus by PCR NEGATIVE NEGATIVE Final    Comment: (NOTE) Fact Sheet for Patients: EntrepreneurPulse.com.au  Fact Sheet for Healthcare Providers: IncredibleEmployment.be  This test is not yet approved or cleared by the [place] FDA and has been authorized for detection and/or diagnosis of SARS-CoV-2 by FDA under an Emergency Use Authorization (EUA). This EUA will remain in effect (meaning this test can be used) for the duration of the COVID-19 declaration under Section 564(b)(1) of the Act, 21 U.S.C. section 360bbb-3(b)(1), unless the authorization is terminated or revoked.  Performed at [facility], [place], [place] [address]   C Difficile Quick Screen w PCR reflex     Status: None   Collection Time: 09/24/22 12:30 PM   Specimen: STOOL  Result Value Ref Range Status   C Diff antigen NEGATIVE NEGATIVE Final   C Diff toxin NEGATIVE NEGATIVE Final   C Diff interpretation No C. difficile detected.  Final    Comment: Performed at [facility], [address] [place], [place] [address]    Labs: CBC: Recent Labs  Lab 09/23/22 1931 09/24/22 0652 09/25/22 0433  WBC 11.4* 8.3 7.0  NEUTROABS 9.2*  --   --   HGB 15.6 14.3 14.2  HCT 48.0 43.5 43.8  MCV 95.8 94.6 94.2  PLT 203 185 [id number]    Basic Metabolic Panel: Recent Labs  Lab 09/23/22 1931 09/24/22 0652 09/25/22 0433  NA 132* 134* 136  K 3.4* 3.1* 3.4*  CL 96* 106 108  CO2 20* 21* 22  GLUCOSE 177* 138* 145*  BUN 42* 36* 28*  CREATININE 1.39* 1.10 1.04  CALCIUM 8.7* 8.0* 8.2*   Liver Function Tests: Recent Labs  Lab 09/23/22 1931  AST 54*  ALT 35  ALKPHOS 78  BILITOT 1.2  PROT 6.8  ALBUMIN 3.5   CBG: Recent Labs  Lab 09/24/22 1212 09/24/22 1703 09/24/22 2156 09/25/22 0837 09/25/22 1154  GLUCAP 152* 178* 207* 192* 219*    Discharge time spent: greater than 30 minutes.  Signed: Max Sane, MD [facility] 09/25/2022

## 2022-09-25 NOTE — TOC Transition Note (Signed)
Transition of Care [facility]) - CM/SW Discharge Note   Patient Details  Name: Danny BARRETT Sr. MRN: [id number] Date of Birth: 11/01/1950  [facility] CM/SW Contact:  Laurena Slimmer, RN Phone Number: 09/25/2022, 1:34 PM   Clinical Narrative:    Referrals for [facility] sent to [facility] and [facility].  Per [place] at [facility] patient can not be accepted.   Sharmon Revere of [facility] is able to take patient.  Malachy Mood was informed patient was discharging today.   Contacted patient's daughter to advise her of patient's discharge home by EMS. Patient is able to let himself in the house. Patient has medicaid. He was informed as well as his daughter she or patient may contact [place] to inquire about a CAP worker.   TOC signing off.    Final next level of care: [place] Barriers to Discharge: Continued Medical Work up   Patient Goals and CMS Choice      Discharge Placement                         Discharge Plan and Services Additional resources added to the After Visit Summary for                                       Social Determinants of Health (SDOH) Interventions SDOH Screenings   Food Insecurity: No Food Insecurity (12/12/2021)  Housing: Low Risk  (12/12/2021)  Transportation Needs: No Transportation Needs (12/12/2021)  Depression (PHQ2-9): Low Risk  (09/12/2022)  Financial Resource Strain: Low Risk  (12/12/2021)  Physical Activity: Unknown (12/12/2021)  Social Connections: Unknown (12/12/2021)  Stress: No Stress Concern Present (12/12/2021)  Tobacco Use: Medium Risk (09/23/2022)     Readmission Risk Interventions     No data to display

## 2022-10-03 ENCOUNTER — Telehealth: Payer: Self-pay

## 2022-10-03 ENCOUNTER — Other Ambulatory Visit: Payer: Self-pay | Admitting: Family Medicine

## 2022-10-03 DIAGNOSIS — I25118 Atherosclerotic heart disease of native coronary artery with other forms of angina pectoris: Secondary | ICD-10-CM | POA: Diagnosis not present

## 2022-10-03 DIAGNOSIS — Z7985 Long-term (current) use of injectable non-insulin antidiabetic drugs: Secondary | ICD-10-CM | POA: Diagnosis not present

## 2022-10-03 DIAGNOSIS — I110 Hypertensive heart disease with heart failure: Secondary | ICD-10-CM | POA: Diagnosis not present

## 2022-10-03 DIAGNOSIS — E1169 Type 2 diabetes mellitus with other specified complication: Secondary | ICD-10-CM | POA: Diagnosis not present

## 2022-10-03 DIAGNOSIS — Z7902 Long term (current) use of antithrombotics/antiplatelets: Secondary | ICD-10-CM | POA: Diagnosis not present

## 2022-10-03 DIAGNOSIS — N183 Chronic kidney disease, stage 3 unspecified: Secondary | ICD-10-CM

## 2022-10-03 DIAGNOSIS — I5032 Chronic diastolic (congestive) heart failure: Secondary | ICD-10-CM | POA: Diagnosis not present

## 2022-10-03 DIAGNOSIS — J441 Chronic obstructive pulmonary disease with (acute) exacerbation: Secondary | ICD-10-CM | POA: Diagnosis not present

## 2022-10-03 DIAGNOSIS — Z993 Dependence on wheelchair: Secondary | ICD-10-CM | POA: Diagnosis not present

## 2022-10-03 DIAGNOSIS — E785 Hyperlipidemia, unspecified: Secondary | ICD-10-CM | POA: Diagnosis not present

## 2022-10-03 DIAGNOSIS — E1022 Type 1 diabetes mellitus with diabetic chronic kidney disease: Secondary | ICD-10-CM

## 2022-10-03 DIAGNOSIS — M6281 Muscle weakness (generalized): Secondary | ICD-10-CM | POA: Diagnosis not present

## 2022-10-03 DIAGNOSIS — F32A Depression, unspecified: Secondary | ICD-10-CM | POA: Diagnosis not present

## 2022-10-03 DIAGNOSIS — Z91148 Patient's other noncompliance with medication regimen for other reason: Secondary | ICD-10-CM | POA: Diagnosis not present

## 2022-10-03 DIAGNOSIS — Z794 Long term (current) use of insulin: Secondary | ICD-10-CM | POA: Diagnosis not present

## 2022-10-03 DIAGNOSIS — M6282 Rhabdomyolysis: Secondary | ICD-10-CM | POA: Diagnosis not present

## 2022-10-03 DIAGNOSIS — Z89512 Acquired absence of left leg below knee: Secondary | ICD-10-CM | POA: Diagnosis not present

## 2022-10-03 DIAGNOSIS — Z87891 Personal history of nicotine dependence: Secondary | ICD-10-CM | POA: Diagnosis not present

## 2022-10-03 DIAGNOSIS — Z9181 History of falling: Secondary | ICD-10-CM | POA: Diagnosis not present

## 2022-10-03 DIAGNOSIS — E1151 Type 2 diabetes mellitus with diabetic peripheral angiopathy without gangrene: Secondary | ICD-10-CM | POA: Diagnosis not present

## 2022-10-03 NOTE — Telephone Encounter (Signed)
Rory Percy states she with [facility] and she is having her first visit with patient.  Ivin Booty states patient was discharged from hospital on 09/25/2022 and we need to schedule a hospital follow-up appointment.  I was able to schedule a hospital follow-up appointment with Dr. Tommi Rumps on 10/08/2022.  Ivin Booty states patient currently gets a shot of Trulicity once a week for his diabetes in addition to his sliding scale insulin.  Ivin Booty states patient is concerned about having to have another shot, Mounjaro, which he received while in the hospital.  Ivin Booty states the hospitalist wanted patient to stay on the [facility], but Ivin Booty states she hasn't been able to locate a script for this.  Ivin Booty states she was able to help patient put on his Elenor Legato, so we should have a record of what's going on with patient by the time he comes to his appointment with [place] on 10/08/2022.    Ivin Booty states PT and OT will be coming to see patient.  Ivin Booty states his artificial leg needs to be assessed when he comes to see [place] on 10/08/2022 because it seems to be too tight.

## 2022-10-08 ENCOUNTER — Encounter: Payer: Self-pay | Admitting: Family Medicine

## 2022-10-08 ENCOUNTER — Ambulatory Visit (INDEPENDENT_AMBULATORY_CARE_PROVIDER_SITE_OTHER): Payer: 59 | Admitting: Family Medicine

## 2022-10-08 VITALS — BP 116/68 | HR 85 | Temp 98.4°F | Ht 72.0 in | Wt 236.0 lb

## 2022-10-08 DIAGNOSIS — E785 Hyperlipidemia, unspecified: Secondary | ICD-10-CM

## 2022-10-08 DIAGNOSIS — K529 Noninfective gastroenteritis and colitis, unspecified: Secondary | ICD-10-CM | POA: Diagnosis not present

## 2022-10-08 DIAGNOSIS — I10 Essential (primary) hypertension: Secondary | ICD-10-CM

## 2022-10-08 DIAGNOSIS — M6282 Rhabdomyolysis: Secondary | ICD-10-CM

## 2022-10-08 DIAGNOSIS — E1169 Type 2 diabetes mellitus with other specified complication: Secondary | ICD-10-CM | POA: Diagnosis not present

## 2022-10-08 DIAGNOSIS — N179 Acute kidney failure, unspecified: Secondary | ICD-10-CM | POA: Diagnosis not present

## 2022-10-08 MED ORDER — EMPAGLIFLOZIN 25 MG PO TABS: 25.0000 mg | ORAL_TABLET | Freq: Every day | ORAL | 3 refills | Status: DC

## 2022-10-08 NOTE — Patient Instructions (Signed)
Nice to see you. I am going to have one of our pharmacists call you to discuss your diabetes and see if we can get it under better control.  We will schedule you for labs on your way out.

## 2022-10-09 ENCOUNTER — Telehealth: Payer: Self-pay

## 2022-10-09 ENCOUNTER — Telehealth: Payer: Self-pay | Admitting: *Deleted

## 2022-10-09 DIAGNOSIS — M6282 Rhabdomyolysis: Secondary | ICD-10-CM | POA: Diagnosis not present

## 2022-10-09 DIAGNOSIS — E785 Hyperlipidemia, unspecified: Secondary | ICD-10-CM | POA: Diagnosis not present

## 2022-10-09 DIAGNOSIS — I5032 Chronic diastolic (congestive) heart failure: Secondary | ICD-10-CM

## 2022-10-09 DIAGNOSIS — J441 Chronic obstructive pulmonary disease with (acute) exacerbation: Secondary | ICD-10-CM | POA: Diagnosis not present

## 2022-10-09 DIAGNOSIS — N179 Acute kidney failure, unspecified: Secondary | ICD-10-CM

## 2022-10-09 DIAGNOSIS — Z993 Dependence on wheelchair: Secondary | ICD-10-CM | POA: Diagnosis not present

## 2022-10-09 DIAGNOSIS — E1169 Type 2 diabetes mellitus with other specified complication: Secondary | ICD-10-CM

## 2022-10-09 DIAGNOSIS — E1151 Type 2 diabetes mellitus with diabetic peripheral angiopathy without gangrene: Secondary | ICD-10-CM | POA: Diagnosis not present

## 2022-10-09 DIAGNOSIS — Z794 Long term (current) use of insulin: Secondary | ICD-10-CM | POA: Diagnosis not present

## 2022-10-09 DIAGNOSIS — Z89512 Acquired absence of left leg below knee: Secondary | ICD-10-CM | POA: Diagnosis not present

## 2022-10-09 DIAGNOSIS — Z9181 History of falling: Secondary | ICD-10-CM | POA: Diagnosis not present

## 2022-10-09 DIAGNOSIS — I10 Essential (primary) hypertension: Secondary | ICD-10-CM

## 2022-10-09 DIAGNOSIS — Z7902 Long term (current) use of antithrombotics/antiplatelets: Secondary | ICD-10-CM | POA: Diagnosis not present

## 2022-10-09 DIAGNOSIS — I110 Hypertensive heart disease with heart failure: Secondary | ICD-10-CM | POA: Diagnosis not present

## 2022-10-09 DIAGNOSIS — Z87891 Personal history of nicotine dependence: Secondary | ICD-10-CM | POA: Diagnosis not present

## 2022-10-09 DIAGNOSIS — M6281 Muscle weakness (generalized): Secondary | ICD-10-CM | POA: Diagnosis not present

## 2022-10-09 DIAGNOSIS — I25118 Atherosclerotic heart disease of native coronary artery with other forms of angina pectoris: Secondary | ICD-10-CM | POA: Diagnosis not present

## 2022-10-09 DIAGNOSIS — Z7985 Long-term (current) use of injectable non-insulin antidiabetic drugs: Secondary | ICD-10-CM | POA: Diagnosis not present

## 2022-10-09 DIAGNOSIS — F32A Depression, unspecified: Secondary | ICD-10-CM | POA: Diagnosis not present

## 2022-10-09 DIAGNOSIS — Z91148 Patient's other noncompliance with medication regimen for other reason: Secondary | ICD-10-CM | POA: Diagnosis not present

## 2022-10-09 NOTE — Progress Notes (Unsigned)
Danny Rumps, MD Phone: [phone]  Danny Estimable Wymer Sr. is a 72 y.o. male who presents today for follow-up.  Gastroenteritis: Patient was hospitalized recently for gastroenteritis with significant diarrhea.  He noted no vomiting though vomiting was reported in  Social History   Tobacco Use  Smoking Status Former   Packs/day: 3.00   Years: 0.00   Total pack years: 0.00   Types: Cigarettes   Quit date: 09/07/2009   Years since quitting: 13.0  Smokeless Tobacco Never  Tobacco Comments   quit june 2011    Current Outpatient Medications on File Prior to Visit  Medication Sig Dispense Refill   acetaminophen (TYLENOL) 325 MG tablet Take 2 tablets (650 mg total) by mouth every 4 (four) hours as needed for mild pain (or temp > 37.5 C (99.5 F)).     albuterol (PROVENTIL) (2.5 MG/3ML) 0.083% nebulizer solution Take 3 mLs (2.5 mg total) by nebulization every 6 (six) hours as needed for wheezing or shortness of breath. 150 mL 1   Albuterol Sulfate (PROAIR RESPICLICK) 123XX123 (90 Base) MCG/ACT AEPB Inhale 1 puff into the lungs every 6 (six) hours as needed. 1 each 12   amLODipine (NORVASC) 10 MG tablet Take 1 tablet (10 mg total) by mouth daily. 30 tablet 0   aspirin EC 81 MG tablet Take 1 tablet (81 mg total) by mouth daily. 150 tablet 2   atorvastatin (LIPITOR) 80 MG tablet Take 1 tablet (80 mg total) by mouth daily. 90 tablet 3   bismuth subsalicylate (PEPTO BISMOL) 262 MG/15ML suspension Take 30 mLs by mouth 4 (four) times daily -  before meals and at bedtime. 360 mL 0   butalbital-acetaminophen-caffeine (FIORICET) 50-325-40 MG tablet Take 1 tablet by mouth every 8 (eight) hours as needed for headache. 14 tablet 0   carvedilol (COREG) 25 MG tablet Take 1 tablet (25 mg total) by mouth 2 (two) times daily with a meal. 60 tablet 0   clopidogrel (PLAVIX) 75 MG tablet Take 1 tablet (75 mg total) by mouth daily. 30 tablet 11   Continuous Blood Gluc Receiver (FREESTYLE LIBRE 2 READER) DEVI 1 Device  by Does not apply route 4 (four) times daily. 1 each 0   Continuous Blood Gluc Sensor (FREESTYLE LIBRE 2 SENSOR) MISC USE 1 SENSOR EVERY 14 DAYS 6 each 3   escitalopram (LEXAPRO) 10 MG tablet Take 1 tablet (10 mg total) by mouth daily. 90 tablet 1   fluticasone-salmeterol (ADVAIR) 250-50 MCG/ACT AEPB Inhale 1 puff into the lungs 2 (two) times daily. 3 each 1   Incontinence Supply Disposable (PROCARE ADULT BRIEFS X-LARGE) MISC 1 Application by Does not apply route as needed (For incontinence). 50 each 3   insulin lispro (HUMALOG KWIKPEN) 100 UNIT/ML KwikPen Inject 0-15 Units into the skin 4 (four) times daily -  before meals and at bedtime. CBG < 70: Implement Hypoglycemia Standing Orders and refer to Hypoglycemia Standing Orders sidebar report CBG 70 - 120: 0 units CBG 121 - 150: 2 units CBG 151 - 200: 3 units CBG 201 - 250: 5 units CBG 251 - 300: 8 units CBG 301 - 350: 11 units CBG 351 - 400: 15 units CBG > 400: call MD 15 mL 11   Insulin Pen Needle (BD PEN NEEDLE MICRO U/F) 32G X 6 MM MISC 1 EACH BY DOES NOT APPLY ROUTE 2 (TWO) TIMES DAILY. 100 each 11   loperamide (IMODIUM A-D) 2 MG tablet Take 1 tablet (2 mg total) by mouth 4 (four) times  daily as needed for diarrhea or loose stools. 30 tablet 0   nitroGLYCERIN (NITROSTAT) 0.4 MG SL tablet Place 0.4 mg under the tongue every 5 (five) minutes as needed for chest pain.     nystatin ointment (MYCOSTATIN) APPLY TO AFFECTED AREA TWICE A DAY 30 g 0   spironolactone (ALDACTONE) 25 MG tablet TAKE 1 TABLET (25 MG TOTAL) BY MOUTH DAILY. 90 tablet 1   tamsulosin (FLOMAX) 0.4 MG CAPS capsule TAKE 1 CAPSULE BY MOUTH EVERY DAY 90 capsule 1   tirzepatide (MOUNJARO) 5 MG/0.5ML Pen Inject 5 mg into the skin once a week. Start after you finish the 28 day course of the 2.5 mg dose. 6 mL 1   TRULICITY 4.5 0000000 SOPN SMARTSIG:4.5 Milligram(s) SUB-Q Once a Week     No current facility-administered medications on file prior to visit.     ROS see history of  present illness  Objective  Physical Exam Vitals:   10/08/22 1649  BP: 116/68  Pulse: 85  Temp: 98.4 F (36.9 C)  SpO2: 98%    BP Readings from Last 3 Encounters:  10/08/22 116/68  09/25/22 (!) 169/90  09/12/22 130/80   Wt Readings from Last 3 Encounters:  10/08/22 236 lb (107 kg)  09/24/22 222 lb (100.7 kg)  08/02/22 236 lb (107 kg)    Physical Exam Constitutional:      General: He is not in acute distress.    Appearance: He is not diaphoretic.  Cardiovascular:     Rate and Rhythm: Normal rate and regular rhythm.     Heart sounds: Normal heart sounds.  Pulmonary:     Effort: Pulmonary effort is normal.     Breath sounds: Normal breath sounds.  Abdominal:     General: Bowel sounds are normal. There is no distension.     Palpations: Abdomen is soft.     Tenderness: There is no abdominal tenderness.  Skin:    General: Skin is warm and dry.  Neurological:     Mental Status: He is alert.      Assessment/Plan: Please see individual problem list.  Non-traumatic rhabdomyolysis -     Basic metabolic panel; Future -     CK; Future  Type 2 diabetes mellitus with hyperlipidemia (HCC) -     Empagliflozin; Take 1 tablet (25 mg total) by mouth daily.  Dispense: 90 tablet; Refill: 3 -     AMB Referral to Pharmacy Medication Management     Health Maintenance: ***  Return in about 1 week (around 10/15/2022) for labs, 3 months PCP.   Danny Rumps, MD [place]

## 2022-10-09 NOTE — Telephone Encounter (Signed)
Called APS to report that patient in office on 10/08/22 smelling strong of feces and urine, patient advised nurse " I am left in home all day " "I never go anywhere" " I wish and hope to get cleaned up." Patient appt was at 1630 and patient was not picked up until 1836 he cannot transfer on his own per patient needs assistance, wheel chair bound amputee.. Nurse upon entering office read report from [facility] CSW and called to confirm a case had been reported. Left message office returned call but waiting for caseworker to return call.Spoke again with DSS and gave information attained yesterday and the facts stated in report from [place] from [facility]. Spoke with Elonda Husky case worker stated they will reopen the case and investigate.

## 2022-10-09 NOTE — Telephone Encounter (Signed)
Noted. Thanks for calling on this.

## 2022-10-09 NOTE — Progress Notes (Signed)
   Care Guide Note  10/09/2022 Name: Danny Fetterolf Mannan Sr. MRN: [id number] DOB: 29-Dec-1950  Referred by: [name], MD Reason for referral : Care Coordination (Outreach to schedule with pharm d )   Danny Estimable Bosques Sr. is a 72 y.o. year old male who is a primary care patient of [name], [name], MD. Danny Estimable Mun Sr. was referred to the pharmacist for assistance related to DM.    Successful contact was made with the patient to discuss pharmacy services including being ready for the pharmacist to call at least 5 minutes before the scheduled appointment time, to have medication bottles and any blood sugar or blood pressure readings ready for review. The patient agreed to meet with the pharmacist via with the pharmacist via telephone visit on (date/time).  11/02/2022  Noreene Larsson, [place], [place] [address] Direct Dial: [phone] [email or web address]

## 2022-10-10 ENCOUNTER — Telehealth: Payer: Self-pay

## 2022-10-10 NOTE — Assessment & Plan Note (Signed)
Chronic issue.  Blood pressure is well-controlled.  He will continue spironolactone 25 mg daily, carvedilol 25 mg twice daily, and amlodipine 10 mg daily.

## 2022-10-10 NOTE — Assessment & Plan Note (Signed)
Plan to recheck CK.

## 2022-10-10 NOTE — Assessment & Plan Note (Signed)
Plan to recheck kidney function.  Given the time of day the patient's visit was we are not able to get labs.  He will return in the next week for lab work.

## 2022-10-10 NOTE — Telephone Encounter (Signed)
241 pm. 1st attempt.   New palliative care referral received.  Phone call made to cell number and home number.  No answer and VM is not set up.

## 2022-10-10 NOTE — Assessment & Plan Note (Signed)
Chronic issue.  Uncontrolled.  It is unclear which once weekly injection he is taking.  I am going to have our clinical pharmacist reach out to him to try to do medication review while he is at home.  For now he will continue Jardiance 25 mg daily and Humalog sliding scale insulin.

## 2022-10-10 NOTE — Assessment & Plan Note (Signed)
Symptoms have resolved.  He will monitor for recurrence.

## 2022-10-12 ENCOUNTER — Telehealth: Payer: Self-pay

## 2022-10-12 NOTE — Telephone Encounter (Signed)
Palliative care outreach to patient to schedule initial PC visit.  Visit scheduled for [facility] 3/11 '@1230'$ 

## 2022-10-15 ENCOUNTER — Telehealth: Payer: Self-pay

## 2022-10-15 ENCOUNTER — Other Ambulatory Visit: Payer: Medicaid Other

## 2022-10-15 DIAGNOSIS — I110 Hypertensive heart disease with heart failure: Secondary | ICD-10-CM | POA: Diagnosis not present

## 2022-10-15 DIAGNOSIS — M6281 Muscle weakness (generalized): Secondary | ICD-10-CM | POA: Diagnosis not present

## 2022-10-15 DIAGNOSIS — Z9181 History of falling: Secondary | ICD-10-CM | POA: Diagnosis not present

## 2022-10-15 DIAGNOSIS — Z7902 Long term (current) use of antithrombotics/antiplatelets: Secondary | ICD-10-CM | POA: Diagnosis not present

## 2022-10-15 DIAGNOSIS — J441 Chronic obstructive pulmonary disease with (acute) exacerbation: Secondary | ICD-10-CM | POA: Diagnosis not present

## 2022-10-15 DIAGNOSIS — I5032 Chronic diastolic (congestive) heart failure: Secondary | ICD-10-CM | POA: Diagnosis not present

## 2022-10-15 DIAGNOSIS — Z89512 Acquired absence of left leg below knee: Secondary | ICD-10-CM | POA: Diagnosis not present

## 2022-10-15 DIAGNOSIS — F32A Depression, unspecified: Secondary | ICD-10-CM | POA: Diagnosis not present

## 2022-10-15 DIAGNOSIS — M6282 Rhabdomyolysis: Secondary | ICD-10-CM | POA: Diagnosis not present

## 2022-10-15 DIAGNOSIS — Z7985 Long-term (current) use of injectable non-insulin antidiabetic drugs: Secondary | ICD-10-CM | POA: Diagnosis not present

## 2022-10-15 DIAGNOSIS — E785 Hyperlipidemia, unspecified: Secondary | ICD-10-CM | POA: Diagnosis not present

## 2022-10-15 DIAGNOSIS — E1151 Type 2 diabetes mellitus with diabetic peripheral angiopathy without gangrene: Secondary | ICD-10-CM | POA: Diagnosis not present

## 2022-10-15 DIAGNOSIS — E1169 Type 2 diabetes mellitus with other specified complication: Secondary | ICD-10-CM | POA: Diagnosis not present

## 2022-10-15 DIAGNOSIS — I25118 Atherosclerotic heart disease of native coronary artery with other forms of angina pectoris: Secondary | ICD-10-CM | POA: Diagnosis not present

## 2022-10-15 DIAGNOSIS — Z794 Long term (current) use of insulin: Secondary | ICD-10-CM | POA: Diagnosis not present

## 2022-10-15 DIAGNOSIS — Z91148 Patient's other noncompliance with medication regimen for other reason: Secondary | ICD-10-CM | POA: Diagnosis not present

## 2022-10-15 DIAGNOSIS — Z87891 Personal history of nicotine dependence: Secondary | ICD-10-CM | POA: Diagnosis not present

## 2022-10-15 DIAGNOSIS — Z993 Dependence on wheelchair: Secondary | ICD-10-CM | POA: Diagnosis not present

## 2022-10-15 NOTE — Progress Notes (Signed)
COMMUNITY PALLIATIVE CARE SW NOTE  PATIENT NAME: Danny EZE Sr. DOB: March 24, 1951 MRN: [id number]  PRIMARY CARE PROVIDER: [name], MD  RESPONSIBLE PARTY:  Acct ID - Guarantor Home Phone Work Phone Relationship Acct Type  [id number] [name], [name]  Self P/F     [address] [place], [place] [address]     PLAN OF CARE and INTERVENTIONS:              Patient was a no-show for appointment.   TC to patient while at the home, no answer, and unable to LVM.      SOCIAL HX:  Social History   Tobacco Use   Smoking status: Former    Packs/day: 3.00    Years: 0.00    Total pack years: 0.00    Types: Cigarettes    Quit date: 09/07/2009    Years since quitting: 13.1   Smokeless tobacco: Never   Tobacco comments:    quit june 2011  Substance Use Topics   Alcohol use: Not Currently    Alcohol/week: 7.0 standard drinks of alcohol    Types: 6 Cans of beer, 1 Standard drinks or equivalent per week    Comment: last week 07/26/2022        [place], LCSW

## 2022-10-15 NOTE — Telephone Encounter (Signed)
Outreach to [employer] with Danny [name], as it looks like patient has an open case with her.  LVM awaiting return call.

## 2022-10-15 NOTE — Addendum Note (Signed)
Addended by: Nanci Pina on: 10/15/2022 03:03 PM   Modules accepted: Orders

## 2022-10-16 ENCOUNTER — Telehealth: Payer: Self-pay | Admitting: *Deleted

## 2022-10-16 DIAGNOSIS — E1169 Type 2 diabetes mellitus with other specified complication: Secondary | ICD-10-CM | POA: Diagnosis not present

## 2022-10-16 DIAGNOSIS — Z91148 Patient's other noncompliance with medication regimen for other reason: Secondary | ICD-10-CM | POA: Diagnosis not present

## 2022-10-16 DIAGNOSIS — Z993 Dependence on wheelchair: Secondary | ICD-10-CM | POA: Diagnosis not present

## 2022-10-16 DIAGNOSIS — Z7902 Long term (current) use of antithrombotics/antiplatelets: Secondary | ICD-10-CM | POA: Diagnosis not present

## 2022-10-16 DIAGNOSIS — I25118 Atherosclerotic heart disease of native coronary artery with other forms of angina pectoris: Secondary | ICD-10-CM | POA: Diagnosis not present

## 2022-10-16 DIAGNOSIS — Z794 Long term (current) use of insulin: Secondary | ICD-10-CM | POA: Diagnosis not present

## 2022-10-16 DIAGNOSIS — F32A Depression, unspecified: Secondary | ICD-10-CM | POA: Diagnosis not present

## 2022-10-16 DIAGNOSIS — J449 Chronic obstructive pulmonary disease, unspecified: Secondary | ICD-10-CM | POA: Diagnosis not present

## 2022-10-16 DIAGNOSIS — E785 Hyperlipidemia, unspecified: Secondary | ICD-10-CM | POA: Diagnosis not present

## 2022-10-16 DIAGNOSIS — I5032 Chronic diastolic (congestive) heart failure: Secondary | ICD-10-CM | POA: Diagnosis not present

## 2022-10-16 DIAGNOSIS — Z87891 Personal history of nicotine dependence: Secondary | ICD-10-CM | POA: Diagnosis not present

## 2022-10-16 DIAGNOSIS — I110 Hypertensive heart disease with heart failure: Secondary | ICD-10-CM | POA: Diagnosis not present

## 2022-10-16 DIAGNOSIS — M6281 Muscle weakness (generalized): Secondary | ICD-10-CM | POA: Diagnosis not present

## 2022-10-16 DIAGNOSIS — J441 Chronic obstructive pulmonary disease with (acute) exacerbation: Secondary | ICD-10-CM | POA: Diagnosis not present

## 2022-10-16 DIAGNOSIS — Z9181 History of falling: Secondary | ICD-10-CM | POA: Diagnosis not present

## 2022-10-16 DIAGNOSIS — Z89512 Acquired absence of left leg below knee: Secondary | ICD-10-CM | POA: Diagnosis not present

## 2022-10-16 DIAGNOSIS — Z7985 Long-term (current) use of injectable non-insulin antidiabetic drugs: Secondary | ICD-10-CM | POA: Diagnosis not present

## 2022-10-16 DIAGNOSIS — E1151 Type 2 diabetes mellitus with diabetic peripheral angiopathy without gangrene: Secondary | ICD-10-CM | POA: Diagnosis not present

## 2022-10-16 DIAGNOSIS — M6282 Rhabdomyolysis: Secondary | ICD-10-CM | POA: Diagnosis not present

## 2022-10-16 NOTE — Progress Notes (Signed)
  Care Coordination   Note   10/16/2022 Name: Danny Macintyre Escareno Sr. MRN: [id number] DOB: [birth year]  Danny Estimable Heidrich Sr. is a 72 y.o. year old male who sees Sonnenberg, [name], MD for primary care. I reached out to [employer] Sr. by phone today to offer care coordination services.  [name] was given information about Care Coordination services today including:   The Care Coordination services include support from the care team which includes your Nurse Coordinator, Clinical Social Worker, or Pharmacist.  The Care Coordination team is here to help remove barriers to the health concerns and goals most important to you. Care Coordination services are voluntary, and the patient may decline or stop services at any time by request to their care team member.   Care Coordination Consent Status: Patient agreed to services and verbal consent obtained.   Follow up plan:  Telephone appointment with care coordination team member scheduled for:  10/19/2022  Encounter Outcome:  Pt. Scheduled from referral   Julian Hy, [place] Direct Dial: [phone]

## 2022-10-17 ENCOUNTER — Telehealth: Payer: Self-pay | Admitting: *Deleted

## 2022-10-17 NOTE — Telephone Encounter (Signed)
   Telephone encounter was:  Successful.  10/17/2022 Name: Danny Morandi Nakama Sr. MRN: [id number] DOB: [birth year]  Danny Estimable Bosler Sr. is a 72 y.o. year old male who is a primary care patient of [name], [name], MD . The community resource team was consulted for assistance with Transportation Needs  and Ang[place] guide performed the following interventions: Patient provided with information about care guide support team and interviewed to confirm resource needs. Home # has no VM setup , mobile # is not the patient phone, patient upset about 23 medicine bill at [employer] for delivery advised to call [facility] and have them transfered [facility] , patient ok with food and nephew schedules the transportation through [place] , will put in a referral for MOW through [place] . Lab appt is scheduled with [facility] by nephew patient expressed no other needs at this time   Follow Up Plan:  No further follow up planned at this time. The patient has been provided with needed resources.  [facility] [phone] [address] , [place] [address] Email : [email or web address]

## 2022-10-18 ENCOUNTER — Telehealth: Payer: Self-pay

## 2022-10-18 ENCOUNTER — Other Ambulatory Visit (INDEPENDENT_AMBULATORY_CARE_PROVIDER_SITE_OTHER): Payer: 59

## 2022-10-18 DIAGNOSIS — F32A Depression, unspecified: Secondary | ICD-10-CM | POA: Diagnosis not present

## 2022-10-18 DIAGNOSIS — M6282 Rhabdomyolysis: Secondary | ICD-10-CM | POA: Diagnosis not present

## 2022-10-18 DIAGNOSIS — E1151 Type 2 diabetes mellitus with diabetic peripheral angiopathy without gangrene: Secondary | ICD-10-CM | POA: Diagnosis not present

## 2022-10-18 DIAGNOSIS — I25118 Atherosclerotic heart disease of native coronary artery with other forms of angina pectoris: Secondary | ICD-10-CM | POA: Diagnosis not present

## 2022-10-18 DIAGNOSIS — Z993 Dependence on wheelchair: Secondary | ICD-10-CM | POA: Diagnosis not present

## 2022-10-18 DIAGNOSIS — Z87891 Personal history of nicotine dependence: Secondary | ICD-10-CM | POA: Diagnosis not present

## 2022-10-18 DIAGNOSIS — Z9181 History of falling: Secondary | ICD-10-CM | POA: Diagnosis not present

## 2022-10-18 DIAGNOSIS — Z89512 Acquired absence of left leg below knee: Secondary | ICD-10-CM | POA: Diagnosis not present

## 2022-10-18 DIAGNOSIS — Z7902 Long term (current) use of antithrombotics/antiplatelets: Secondary | ICD-10-CM | POA: Diagnosis not present

## 2022-10-18 DIAGNOSIS — Z91148 Patient's other noncompliance with medication regimen for other reason: Secondary | ICD-10-CM | POA: Diagnosis not present

## 2022-10-18 DIAGNOSIS — I5032 Chronic diastolic (congestive) heart failure: Secondary | ICD-10-CM | POA: Diagnosis not present

## 2022-10-18 DIAGNOSIS — Z7985 Long-term (current) use of injectable non-insulin antidiabetic drugs: Secondary | ICD-10-CM | POA: Diagnosis not present

## 2022-10-18 DIAGNOSIS — Z794 Long term (current) use of insulin: Secondary | ICD-10-CM | POA: Diagnosis not present

## 2022-10-18 DIAGNOSIS — J441 Chronic obstructive pulmonary disease with (acute) exacerbation: Secondary | ICD-10-CM | POA: Diagnosis not present

## 2022-10-18 DIAGNOSIS — E785 Hyperlipidemia, unspecified: Secondary | ICD-10-CM | POA: Diagnosis not present

## 2022-10-18 DIAGNOSIS — M6281 Muscle weakness (generalized): Secondary | ICD-10-CM | POA: Diagnosis not present

## 2022-10-18 DIAGNOSIS — I110 Hypertensive heart disease with heart failure: Secondary | ICD-10-CM | POA: Diagnosis not present

## 2022-10-18 DIAGNOSIS — E1169 Type 2 diabetes mellitus with other specified complication: Secondary | ICD-10-CM | POA: Diagnosis not present

## 2022-10-18 LAB — BASIC METABOLIC PANEL
BUN: 33 mg/dL — ABNORMAL HIGH (ref 6–23)
CO2: 27 mEq/L (ref 19–32)
Calcium: 8.7 mg/dL (ref 8.4–10.5)
Chloride: 101 mEq/L (ref 96–112)
Creatinine, Ser: 1.23 mg/dL (ref 0.40–1.50)
GFR: 58.97 mL/min — ABNORMAL LOW (ref >60.00)
Glucose, Bld: 211 mg/dL — ABNORMAL HIGH (ref 70–99)
Potassium: 4.2 mEq/L (ref 3.5–5.1)
Sodium: 137 mEq/L (ref 135–145)

## 2022-10-18 LAB — CK: Total CK: 65 U/L (ref 7–232)

## 2022-10-18 NOTE — Telephone Encounter (Signed)
Pt in office for lab appointment.  Was brought by medical transport company and left unattended.  When company arrived to pick up pt asked driver if I could take a picture of company name and contact information for pt chart. He was agreeable.    [place] [id number]

## 2022-10-19 ENCOUNTER — Encounter: Payer: Self-pay | Admitting: *Deleted

## 2022-10-19 ENCOUNTER — Telehealth: Payer: Self-pay | Admitting: *Deleted

## 2022-10-19 DIAGNOSIS — R404 Transient alteration of awareness: Secondary | ICD-10-CM | POA: Diagnosis not present

## 2022-10-19 DIAGNOSIS — I499 Cardiac arrhythmia, unspecified: Secondary | ICD-10-CM | POA: Diagnosis not present

## 2022-10-19 DIAGNOSIS — R6889 Other general symptoms and signs: Secondary | ICD-10-CM | POA: Diagnosis not present

## 2022-10-19 DIAGNOSIS — Z743 Need for continuous supervision: Secondary | ICD-10-CM | POA: Diagnosis not present

## 2022-10-19 DIAGNOSIS — R519 Headache, unspecified: Secondary | ICD-10-CM | POA: Diagnosis not present

## 2022-10-19 NOTE — Patient Outreach (Signed)
  Care Coordination   10/19/2022 Name: Danny Gustave Craw Sr. MRN: [id number] DOB: [birth year]   Care Coordination Outreach Attempts:  An unsuccessful telephone outreach was attempted for a scheduled appointment today.  Follow Up Plan:  Additional outreach attempts will be made to offer the patient care coordination information and services.   Encounter Outcome:  No Answer   Care Coordination Interventions:  No, not indicated    Aretha Levi, [place] Worker  [facility] Care Management [phone]

## 2022-10-20 ENCOUNTER — Encounter: Payer: Self-pay | Admitting: Emergency Medicine

## 2022-10-20 ENCOUNTER — Emergency Department
Admission: EM | Admit: 2022-10-20 | Discharge: 2022-10-20 | Disposition: A | Discharge status: Home / Self Care | Payer: 59 | Attending: Emergency Medicine | Admitting: Emergency Medicine

## 2022-10-20 ENCOUNTER — Other Ambulatory Visit: Payer: Self-pay

## 2022-10-20 DIAGNOSIS — N183 Chronic kidney disease, stage 3 unspecified: Secondary | ICD-10-CM | POA: Diagnosis not present

## 2022-10-20 DIAGNOSIS — E162 Hypoglycemia, unspecified: Secondary | ICD-10-CM

## 2022-10-20 DIAGNOSIS — I2510 Atherosclerotic heart disease of native coronary artery without angina pectoris: Secondary | ICD-10-CM | POA: Diagnosis not present

## 2022-10-20 DIAGNOSIS — E1122 Type 2 diabetes mellitus with diabetic chronic kidney disease: Secondary | ICD-10-CM | POA: Diagnosis not present

## 2022-10-20 DIAGNOSIS — I5042 Chronic combined systolic (congestive) and diastolic (congestive) heart failure: Secondary | ICD-10-CM | POA: Diagnosis not present

## 2022-10-20 DIAGNOSIS — Z794 Long term (current) use of insulin: Secondary | ICD-10-CM | POA: Diagnosis not present

## 2022-10-20 DIAGNOSIS — I10 Essential (primary) hypertension: Secondary | ICD-10-CM | POA: Diagnosis not present

## 2022-10-20 DIAGNOSIS — J449 Chronic obstructive pulmonary disease, unspecified: Secondary | ICD-10-CM | POA: Insufficient documentation

## 2022-10-20 DIAGNOSIS — E11649 Type 2 diabetes mellitus with hypoglycemia without coma: Secondary | ICD-10-CM | POA: Diagnosis not present

## 2022-10-20 DIAGNOSIS — Z7901 Long term (current) use of anticoagulants: Secondary | ICD-10-CM | POA: Insufficient documentation

## 2022-10-20 DIAGNOSIS — I130 Hypertensive heart and chronic kidney disease with heart failure and stage 1 through stage 4 chronic kidney disease, or unspecified chronic kidney disease: Secondary | ICD-10-CM | POA: Insufficient documentation

## 2022-10-20 DIAGNOSIS — Z7982 Long term (current) use of aspirin: Secondary | ICD-10-CM | POA: Insufficient documentation

## 2022-10-20 LAB — COMPREHENSIVE METABOLIC PANEL
ALT: 24 U/L (ref 0–44)
AST: 22 U/L (ref 15–41)
Albumin: 3.9 g/dL (ref 3.5–5.0)
Alkaline Phosphatase: 81 U/L (ref 38–126)
Anion gap: 8 (ref 5–15)
BUN: 32 mg/dL — ABNORMAL HIGH (ref 8–23)
CO2: 27 mmol/L (ref 22–32)
Calcium: 9.1 mg/dL (ref 8.9–10.3)
Chloride: 104 mmol/L (ref 98–111)
Creatinine, Ser: 0.97 mg/dL (ref 0.61–1.24)
GFR, Estimated: >60 mL/min (ref >60)
Glucose, Bld: 52 mg/dL — ABNORMAL LOW (ref 70–99)
Potassium: 3.5 mmol/L (ref 3.5–5.1)
Sodium: 139 mmol/L (ref 135–145)
Total Bilirubin: 0.4 mg/dL (ref 0.3–1.2)
Total Protein: 7.3 g/dL (ref 6.5–8.1)

## 2022-10-20 LAB — CBC WITH DIFFERENTIAL/PLATELET
Abs Immature Granulocytes: 0.09 10*3/uL — ABNORMAL HIGH (ref 0.00–0.07)
Basophils Absolute: 0.1 10*3/uL (ref 0.0–0.1)
Basophils Relative: 1 %
Eosinophils Absolute: 0.6 10*3/uL — ABNORMAL HIGH (ref 0.0–0.5)
Eosinophils Relative: 6 %
HCT: 51 % (ref 39.0–52.0)
Hemoglobin: 16.2 g/dL (ref 13.0–17.0)
Immature Granulocytes: 1 %
Lymphocytes Relative: 15 %
Lymphs Abs: 1.6 10*3/uL (ref 0.7–4.0)
MCH: 30.9 pg (ref 26.0–34.0)
MCHC: 31.8 g/dL (ref 30.0–36.0)
MCV: 97.1 fL (ref 80.0–100.0)
Monocytes Absolute: 1.1 10*3/uL — ABNORMAL HIGH (ref 0.1–1.0)
Monocytes Relative: 11 %
Neutro Abs: 7.1 10*3/uL (ref 1.7–7.7)
Neutrophils Relative %: 66 %
Platelets: 225 10*3/uL (ref 150–400)
RBC: 5.25 MIL/uL (ref 4.22–5.81)
RDW: 12.7 % (ref 11.5–15.5)
WBC: 10.5 10*3/uL (ref 4.0–10.5)
nRBC: 0 % (ref 0.0–0.2)

## 2022-10-20 LAB — URINALYSIS, ROUTINE W REFLEX MICROSCOPIC
Bacteria, UA: NONE SEEN
Bilirubin Urine: NEGATIVE
Glucose, UA: >=500 mg/dL — AB
Hgb urine dipstick: NEGATIVE
Ketones, ur: NEGATIVE mg/dL
Leukocytes,Ua: NEGATIVE
Nitrite: NEGATIVE
Protein, ur: 30 mg/dL — AB
Specific Gravity, Urine: 1.018 (ref 1.005–1.030)
Squamous Epithelial / HPF: NONE SEEN /HPF (ref 0–5)
pH: 5 (ref 5.0–8.0)

## 2022-10-20 LAB — CBG MONITORING, ED
Glucose-Capillary: 65 mg/dL — ABNORMAL LOW (ref 70–99)
Glucose-Capillary: 107 mg/dL — ABNORMAL HIGH (ref 70–99)
Glucose-Capillary: 142 mg/dL — ABNORMAL HIGH (ref 70–99)

## 2022-10-20 MED ORDER — ACETAMINOPHEN 500 MG PO TABS
1000.0000 mg | ORAL_TABLET | Freq: Once | ORAL | Status: AC
  Administered 2022-10-20: 1000 mg via ORAL
  Filled 2022-10-20: qty 2

## 2022-10-20 NOTE — ED Triage Notes (Signed)
  Patient BIB EMS for hypoglycemia.  On EMS arrival patient CBG was 47.  Given 250 ml of D10.  Repeat CBG 197. Patient states he remembers giving himself insulin but not sure how much.  No other complaints before event.  Pain 5/10, headache.  CBG 65 on arrival.

## 2022-10-20 NOTE — ED Notes (Signed)
[facility] called for transport to pt's home  

## 2022-10-20 NOTE — ED Provider Notes (Signed)
[facility] Provider Note    Event Date/Time   First MD Initiated Contact with Patient 10/20/22 0112     (approximate)   History   Hypoglycemia   HPI  Danny CASTIGLIONI Sr. is a 72 y.o. male with history of CAD, CHF, CKD, COPD, hypertension, hyperlipidemia, insulin-dependent type 2 diabetes who presents to the emergency department with hypoglycemia.  Called EMS because he was feeling poorly and blood sugars were found to be 47.  States he normally wears something in his arm that monitors his blood sugar but states it must of fallen out.  He is unable to tell me what his insulin regimen is.  He tells me that he takes 1 shot once a week which he takes on Monday and then is on what sounds like a short acting insulin multiple times a day.  He states he took the short acting insulin tonight but does not know how much he took or what time it was.  On review of his records it looks like he is on Trulicity weekly, Mounjaro weekly, Humalog sliding scale insulin 4 times a day, Jardiance 25 mg daily.  Has otherwise been in his normal state of health.  No vomiting or diarrhea.  Eating and drinking well.  States he feels hungry currently.  Complaining of mild headache but no other pain.  Patient received 250 mL of D10 with EMS.   History provided by patient, EMS.    Past Medical History:  Diagnosis Date   CAD (coronary artery disease)    a. 01/2010 PCI of LAD; b. 09/2014 PCI/DES of mLAD due to ISR. D1 80, D2 80(jailed), LCX 41m; c. 07/2016 NSTEMI/Cath: LM nl, LAD 33m ISR, 40d, D1 90ost, 80p, D2 90ost, RI min irregs, LCX min irregs, OM1 90 small, OM2/3 min irregs, RCA min irregs, RPLB1 90, EF 25-35%.   Chronic combined systolic and diastolic CHF (congestive heart failure) ([place])    a. 07/2016 Echo: EF 30%, severe septal/anterior HK, Gr1 DD.   CKD (chronic kidney disease), stage III (HCC)    COPD (chronic obstructive pulmonary disease) (HCC)    DM2 (diabetes mellitus, type  2) (HCC)    Erectile dysfunction    HLD (hyperlipidemia)    HTN (hypertension)    Ischemic cardiomyopathy    a. 07/2016 Echo: EF 30% w/ sev septal/ant HK. Gr1 DD.    Past Surgical History:  Procedure Laterality Date   AMPUTATION Left 11/10/2020   Procedure: AMPUTATION BELOW KNEE;  Surgeon: [name], MD;  Location: [facility] ORS;  Service: General;  Laterality: Left;   BELOW KNEE LEG AMPUTATION     CAD: stent to the LAD     CARDIAC CATHETERIZATION  10/01/2014   CARDIAC CATHETERIZATION N/A 07/23/2016   Procedure: Left Heart Cath and Coronary Angiography;  Surgeon: [name], MD;  Location: [place] [facility];  Service: Cardiovascular;  Laterality: N/A;   CORONARY ANGIOPLASTY WITH STENT PLACEMENT  10/01/2014   LOWER EXTREMITY ANGIOGRAPHY Left 10/31/2020   Procedure: LOWER EXTREMITY ANGIOGRAPHY;  Surgeon: [name], MD;  Location: [place] [facility];  Service: Cardiovascular;  Laterality: Left;   LOWER EXTREMITY ANGIOGRAPHY Right 11/20/2021   Procedure: Lower Extremity Angiography;  Surgeon: [name], MD;  Location: [place] [facility];  Service: Cardiovascular;  Laterality: Right;    MEDICATIONS:  Prior to Admission medications   Medication Sig Start Date End Date Taking? Authorizing Provider  acetaminophen (TYLENOL) 325 MG tablet Take 2 tablets (  650 mg total) by mouth every 4 (four) hours as needed for mild pain (or temp > 37.5 C (99.5 F)). 10/10/21   Barb Merino, MD  albuterol (PROVENTIL) (2.5 MG/3ML) 0.083% nebulizer solution Take 3 mLs (2.5 mg total) by nebulization every 6 (six) hours as needed for wheezing or shortness of breath. 09/30/20   [name], MD  Albuterol Sulfate (PROAIR RESPICLICK) [id number] (90 Base) MCG/ACT AEPB Inhale 1 puff into the lungs every 6 (six) hours as needed. 12/18/21   [name], MD  amLODipine (NORVASC) 10 MG tablet Take 1 tablet (10 mg total) by mouth daily. 09/26/22 10/26/22  Max Sane, MD  aspirin EC 81 MG tablet Take 1 tablet (81  mg total) by mouth daily. 11/20/21   [name], MD  atorvastatin (LIPITOR) 80 MG tablet Take 1 tablet (80 mg total) by mouth daily. 01/03/22   [name], MD  bismuth subsalicylate (PEPTO BISMOL) 262 MG/15ML suspension Take 30 mLs by mouth 4 (four) times daily -  before meals and at bedtime. 09/25/22   Max Sane, MD  butalbital-acetaminophen-caffeine (FIORICET) [phone] MG tablet Take 1 tablet by mouth every 8 (eight) hours as needed for headache. 10/10/21   Barb Merino, MD  carvedilol (COREG) 25 MG tablet Take 1 tablet (25 mg total) by mouth 2 (two) times daily with a meal. 09/25/22 10/25/22  Max Sane, MD  clopidogrel (PLAVIX) 75 MG tablet Take 1 tablet (75 mg total) by mouth daily. 11/20/21   [name], MD  Continuous Blood Gluc Receiver (FREESTYLE LIBRE 2 READER) DEVI 1 Device by Does not apply route 4 (four) times daily. 04/27/22   [name], MD  Continuous Blood Gluc Sensor (FREESTYLE LIBRE 2 SENSOR) MISC USE 1 SENSOR EVERY 14 DAYS 08/15/22   [name], MD  empagliflozin (JARDIANCE) 25 MG TABS tablet Take 1 tablet (25 mg total) by mouth daily. 10/08/22   [name], MD  escitalopram (LEXAPRO) 10 MG tablet Take 1 tablet (10 mg total) by mouth daily. 01/03/22   [name], MD  fluticasone-salmeterol (ADVAIR) 250-50 MCG/ACT AEPB Inhale 1 puff into the lungs 2 (two) times daily. 07/24/21   [name], MD  Incontinence Supply Disposable (PROCARE ADULT BRIEFS X-LARGE) MISC 1 Application by Does not apply route as needed (For incontinence). 04/30/22   [name], MD  insulin lispro (HUMALOG KWIKPEN) 100 UNIT/ML KwikPen Inject 0-15 Units into the skin 4 (four) times daily -  before meals and at bedtime. CBG < 70: Implement Hypoglycemia Standing Orders and refer to Hypoglycemia Standing Orders sidebar report CBG 70 - 120: 0 units CBG 121 - 150: 2 units CBG 151 - 200: 3 units CBG 201 - 250: 5 units CBG 251 - 300: 8 units CBG 301 - 350: 11 units CBG  351 - 400: 15 units CBG > 400: call MD 05/24/22   [name], MD  Insulin Pen Needle (BD PEN NEEDLE MICRO U/F) 32G X 6 MM MISC 1 EACH BY DOES NOT APPLY ROUTE 2 (TWO) TIMES DAILY. 05/28/22   [name], MD  loperamide (IMODIUM A-D) 2 MG tablet Take 1 tablet (2 mg total) by mouth 4 (four) times daily as needed for diarrhea or loose stools. 09/25/22   Max Sane, MD  nitroGLYCERIN (NITROSTAT) 0.4 MG SL tablet Place 0.4 mg under the tongue every 5 (five) minutes as needed for chest pain.    [provider]  nystatin ointment (MYCOSTATIN) APPLY TO  AFFECTED AREA TWICE A DAY 07/18/22   [name], MD  spironolactone (ALDACTONE) 25 MG tablet TAKE 1 TABLET (25 MG TOTAL) BY MOUTH DAILY. 07/30/22   Dutch Quint B, FNP  tamsulosin (FLOMAX) 0.4 MG CAPS capsule TAKE 1 CAPSULE BY MOUTH EVERY DAY 07/30/22   Dutch Quint B, FNP  tirzepatide [facility]) 5 MG/0.5ML Pen Inject 5 mg into the skin once a week. Start after you finish the 28 day course of the 2.5 mg dose. 07/18/22   [name], MD  TRULICITY 4.5 0000000 SOPN SMARTSIG:4.5 Milligram(s) SUB-Q Once a Week 06/18/22   [provider]    Physical Exam   Triage Vital Signs: ED Triage Vitals  Enc Vitals Group     BP 10/20/22 0103 (!) 155/109     Pulse Rate 10/20/22 0103 82     Resp 10/20/22 0103 18     Temp 10/20/22 0103 97.6 F (36.4 C)     Temp Source 10/20/22 0103 Oral     SpO2 10/20/22 0103 98 %     Weight 10/20/22 0104 236 lb (107 kg)     Height 10/20/22 0104 6' (1.829 m)     Head Circumference --      Peak Flow --      Pain Score 10/20/22 0104 5     Pain Loc --      Pain Edu? --      Excl. in [facility]? --     Most recent vital signs: Vitals:   10/20/22 0200 10/20/22 0300  BP: (!) 157/79 139/88  Pulse: 80 82  Resp: 16 15  Temp: 97.7 F (36.5 C)   SpO2: 96%     CONSTITUTIONAL: Alert, responds appropriately to questions.  Elderly, chronically ill-appearing, currently eating HEAD:  Normocephalic, atraumatic EYES: Conjunctivae clear, pupils appear equal, sclera nonicteric ENT: normal nose; moist mucous membranes NECK: Supple, normal ROM CARD: RRR; S1 and S2 appreciated RESP: Normal chest excursion without splinting or tachypnea; breath sounds clear and equal bilaterally; no wheezes, no rhonchi, no rales, no hypoxia or respiratory distress, speaking full sentences ABD/GI: Non-distended; soft, non-tender, no rebound, no guarding, no peritoneal signs BACK: The back appears normal SKIN: Normal color for age and race; warm; no rash on exposed skin NEURO: Moves all extremities equally, normal speech PSYCH: The patient's mood and manner are appropriate.   ED Results / Procedures / Treatments   LABS: (all labs ordered are listed, but only abnormal results are displayed) Labs Reviewed  CBC WITH DIFFERENTIAL/PLATELET - Abnormal; Notable for the following components:      Result Value   Monocytes Absolute 1.1 (*)    Eosinophils Absolute 0.6 (*)    Abs Immature Granulocytes 0.09 (*)    All other components within normal limits  COMPREHENSIVE METABOLIC PANEL - Abnormal; Notable for the following components:   Glucose, Bld 52 (*)    BUN 32 (*)    All other components within normal limits  URINALYSIS, ROUTINE W REFLEX MICROSCOPIC - Abnormal; Notable for the following components:   Color, Urine STRAW (*)    APPearance CLEAR (*)    Glucose, UA >=500 (*)    Protein, ur 30 (*)    All other components within normal limits  CBG MONITORING, ED - Abnormal; Notable for the following components:   Glucose-Capillary 65 (*)    All other components within normal limits  CBG MONITORING, ED - Abnormal; Notable for the following components:   Glucose-Capillary 107 (*)    All  other components within normal limits  CBG MONITORING, ED - Abnormal; Notable for the following components:   Glucose-Capillary 142 (*)    All other components within normal limits  CBG MONITORING, ED  CBG  MONITORING, ED  CBG MONITORING, ED  CBG MONITORING, ED  CBG MONITORING, ED  CBG MONITORING, ED  CBG MONITORING, ED  CBG MONITORING, ED  CBG MONITORING, ED  CBG MONITORING, ED  CBG MONITORING, ED  CBG MONITORING, ED  CBG MONITORING, ED  CBG MONITORING, ED  CBG MONITORING, ED  CBG MONITORING, ED  CBG MONITORING, ED  CBG MONITORING, ED  CBG MONITORING, ED  CBG MONITORING, ED     EKG:   RADIOLOGY: My personal review and interpretation of imaging:    I have personally reviewed all radiology reports.   No results found.   PROCEDURES:  Critical Care performed: Yes, see critical care procedure note(s)   CRITICAL CARE Performed by: Pryor Curia   Total critical care time: 40 minutes  Critical care time was exclusive of separately billable procedures and treating other patients.  Critical care was necessary to treat or prevent imminent or life-threatening deterioration.  Critical care was time spent personally by me on the following activities: development of treatment plan with patient and/or surrogate as well as nursing, discussions with consultants, evaluation of patient's response to treatment, examination of patient, obtaining history from patient or surrogate, ordering and performing treatments and interventions, ordering and review of laboratory studies, ordering and review of radiographic studies, pulse oximetry and re-evaluation of patient's condition.   [name]1-3 Lead EKG Interpretation  Performed by: Sandford Diop, Delice Bison, DO Authorized by: Floraine Buechler, Delice Bison, DO     Interpretation: normal     ECG rate:  82   ECG rate assessment: normal     Rhythm: sinus rhythm     Ectopy: none     Conduction: normal       IMPRESSION / MDM / ASSESSMENT AND PLAN / ED COURSE  I reviewed the triage vital signs and the nursing notes.    Patient here with hypoglycemia.  Received D10 with EMS and blood sugars have dropped again.  The patient is on the cardiac monitor to evaluate  for evidence of arrhythmia and/or significant heart rate changes.   DIFFERENTIAL DIAGNOSIS (includes but not limited to):   Hypoglycemia, possible medication error, UTI, worsening chronic kidney disease   Patient's presentation is most consistent with acute presentation with potential threat to life or bodily function.   PLAN: Will recheck blood glucose every hour.  Will check CBC, BMP, urinalysis.  Will allow patient to eat and drink.  Will give Tylenol for headache.   MEDICATIONS GIVEN IN ED: Medications  acetaminophen (TYLENOL) tablet 1,000 mg (1,000 mg Oral Given 10/20/22 0115)     ED COURSE: Patient's blood sugars improving in the ED.  Normal renal function, hemoglobin, electrolytes.  No UTI.  I feel patient is safe for discharge.  Recommended that he follow-up with his PCP and or pharmacist for clarification of what medications he is taking and what he should be on.   At this time, I do not feel there is any life-threatening condition present. I reviewed all nursing notes, vitals, pertinent previous records.  All lab and urine results, EKGs, imaging ordered have been independently reviewed and interpreted by myself.  I reviewed all available radiology reports from any imaging ordered this visit.  Based on my assessment, I feel the patient is safe to be discharged home without further  emergent workup and can continue workup as an outpatient as needed. Discussed all findings, treatment plan as well as usual and customary return precautions.  They verbalize understanding and are comfortable with this plan.  Outpatient follow-up has been provided as needed.  All questions have been answered.    CONSULTS:  none   OUTSIDE RECORDS REVIEWED: Reviewed patient's last admission in February 2024 for dehydration.       FINAL CLINICAL IMPRESSION(S) / ED DIAGNOSES   Final diagnoses:  Hypoglycemia     Rx / DC Orders   ED Discharge Orders     None        Note:  This document  was prepared using Dragon voice recognition software and may include unintentional dictation errors.   Jru Pense, Delice Bison, DO 10/20/22 [phone]

## 2022-10-20 NOTE — ED Notes (Signed)
Calls placed to both the patient's son and daughter.  Daughter lives too far away to come and get him, and the son is unable to drive at this time.  Patient and family have requested transport by ambulance.

## 2022-10-20 NOTE — ED Notes (Signed)
Dc IV and male purewick

## 2022-10-22 ENCOUNTER — Telehealth: Payer: Self-pay

## 2022-10-22 DIAGNOSIS — Z7985 Long-term (current) use of injectable non-insulin antidiabetic drugs: Secondary | ICD-10-CM | POA: Diagnosis not present

## 2022-10-22 DIAGNOSIS — I25118 Atherosclerotic heart disease of native coronary artery with other forms of angina pectoris: Secondary | ICD-10-CM | POA: Diagnosis not present

## 2022-10-22 DIAGNOSIS — Z91148 Patient's other noncompliance with medication regimen for other reason: Secondary | ICD-10-CM | POA: Diagnosis not present

## 2022-10-22 DIAGNOSIS — Z7902 Long term (current) use of antithrombotics/antiplatelets: Secondary | ICD-10-CM | POA: Diagnosis not present

## 2022-10-22 DIAGNOSIS — I110 Hypertensive heart disease with heart failure: Secondary | ICD-10-CM | POA: Diagnosis not present

## 2022-10-22 DIAGNOSIS — E785 Hyperlipidemia, unspecified: Secondary | ICD-10-CM | POA: Diagnosis not present

## 2022-10-22 DIAGNOSIS — M6282 Rhabdomyolysis: Secondary | ICD-10-CM | POA: Diagnosis not present

## 2022-10-22 DIAGNOSIS — E1169 Type 2 diabetes mellitus with other specified complication: Secondary | ICD-10-CM | POA: Diagnosis not present

## 2022-10-22 DIAGNOSIS — E1151 Type 2 diabetes mellitus with diabetic peripheral angiopathy without gangrene: Secondary | ICD-10-CM | POA: Diagnosis not present

## 2022-10-22 DIAGNOSIS — F32A Depression, unspecified: Secondary | ICD-10-CM | POA: Diagnosis not present

## 2022-10-22 DIAGNOSIS — Z794 Long term (current) use of insulin: Secondary | ICD-10-CM | POA: Diagnosis not present

## 2022-10-22 DIAGNOSIS — J441 Chronic obstructive pulmonary disease with (acute) exacerbation: Secondary | ICD-10-CM | POA: Diagnosis not present

## 2022-10-22 DIAGNOSIS — Z9181 History of falling: Secondary | ICD-10-CM | POA: Diagnosis not present

## 2022-10-22 DIAGNOSIS — Z993 Dependence on wheelchair: Secondary | ICD-10-CM | POA: Diagnosis not present

## 2022-10-22 DIAGNOSIS — M6281 Muscle weakness (generalized): Secondary | ICD-10-CM | POA: Diagnosis not present

## 2022-10-22 DIAGNOSIS — Z87891 Personal history of nicotine dependence: Secondary | ICD-10-CM | POA: Diagnosis not present

## 2022-10-22 DIAGNOSIS — Z89512 Acquired absence of left leg below knee: Secondary | ICD-10-CM | POA: Diagnosis not present

## 2022-10-22 DIAGNOSIS — I5032 Chronic diastolic (congestive) heart failure: Secondary | ICD-10-CM | POA: Diagnosis not present

## 2022-10-22 NOTE — Telephone Encounter (Signed)
Telephone call to patients APS caseworker, Tamsen Meek [phone].   Tillie Rung shared that she visited with patient on 10/12/22 to complete initial touch base and plans to see patient again this week to complete more detailed visit. Tillie Rung shared that placement had not been discussed with patient as patient shared that his son was providing assistance, but feels placement should be discussed. SW offered support in this discussion and process. SW made APS worker aware of patients missed appointment and attempts to reschedule.

## 2022-10-22 NOTE — Telephone Encounter (Signed)
Telephone to patient to follow up on missed visit and to reschedule.  Call unsuccessful. Unable to LVM due to mailbox not being set up. Will try again at later date/time.

## 2022-10-23 ENCOUNTER — Telehealth: Payer: Self-pay | Admitting: Family Medicine

## 2022-10-23 DIAGNOSIS — Z7985 Long-term (current) use of injectable non-insulin antidiabetic drugs: Secondary | ICD-10-CM | POA: Diagnosis not present

## 2022-10-23 DIAGNOSIS — I619 Nontraumatic intracerebral hemorrhage, unspecified: Secondary | ICD-10-CM | POA: Diagnosis not present

## 2022-10-23 DIAGNOSIS — J441 Chronic obstructive pulmonary disease with (acute) exacerbation: Secondary | ICD-10-CM | POA: Diagnosis not present

## 2022-10-23 DIAGNOSIS — I110 Hypertensive heart disease with heart failure: Secondary | ICD-10-CM | POA: Diagnosis not present

## 2022-10-23 DIAGNOSIS — M6282 Rhabdomyolysis: Secondary | ICD-10-CM | POA: Diagnosis not present

## 2022-10-23 DIAGNOSIS — Z89512 Acquired absence of left leg below knee: Secondary | ICD-10-CM | POA: Diagnosis not present

## 2022-10-23 DIAGNOSIS — Z7902 Long term (current) use of antithrombotics/antiplatelets: Secondary | ICD-10-CM | POA: Diagnosis not present

## 2022-10-23 DIAGNOSIS — Z87891 Personal history of nicotine dependence: Secondary | ICD-10-CM | POA: Diagnosis not present

## 2022-10-23 DIAGNOSIS — Z794 Long term (current) use of insulin: Secondary | ICD-10-CM | POA: Diagnosis not present

## 2022-10-23 DIAGNOSIS — F32A Depression, unspecified: Secondary | ICD-10-CM | POA: Diagnosis not present

## 2022-10-23 DIAGNOSIS — Z9181 History of falling: Secondary | ICD-10-CM | POA: Diagnosis not present

## 2022-10-23 DIAGNOSIS — I25118 Atherosclerotic heart disease of native coronary artery with other forms of angina pectoris: Secondary | ICD-10-CM | POA: Diagnosis not present

## 2022-10-23 DIAGNOSIS — Z91148 Patient's other noncompliance with medication regimen for other reason: Secondary | ICD-10-CM | POA: Diagnosis not present

## 2022-10-23 DIAGNOSIS — E1169 Type 2 diabetes mellitus with other specified complication: Secondary | ICD-10-CM | POA: Diagnosis not present

## 2022-10-23 DIAGNOSIS — Z993 Dependence on wheelchair: Secondary | ICD-10-CM | POA: Diagnosis not present

## 2022-10-23 DIAGNOSIS — M6281 Muscle weakness (generalized): Secondary | ICD-10-CM | POA: Diagnosis not present

## 2022-10-23 DIAGNOSIS — I5032 Chronic diastolic (congestive) heart failure: Secondary | ICD-10-CM | POA: Diagnosis not present

## 2022-10-23 DIAGNOSIS — E1151 Type 2 diabetes mellitus with diabetic peripheral angiopathy without gangrene: Secondary | ICD-10-CM | POA: Diagnosis not present

## 2022-10-23 DIAGNOSIS — E785 Hyperlipidemia, unspecified: Secondary | ICD-10-CM | POA: Diagnosis not present

## 2022-10-23 NOTE — Telephone Encounter (Signed)
Pt physical therapy called in staying that she was over at pt home today, and he was complaining to her that he has gain so much weigh, that he clothes don't fit, and he unable to weigh himself. And she wanted to let his provider know or f/u w/pt.

## 2022-10-24 NOTE — Telephone Encounter (Signed)
Attempted to call Patient- no answer or voicemail so I sent a my chart message

## 2022-10-24 NOTE — Telephone Encounter (Signed)
Noted. We can talk about this at his next visit or he could be scheduled for sooner follow-up to discuss this if he would like.

## 2022-10-25 ENCOUNTER — Telehealth: Payer: Self-pay

## 2022-10-25 DIAGNOSIS — I25118 Atherosclerotic heart disease of native coronary artery with other forms of angina pectoris: Secondary | ICD-10-CM | POA: Diagnosis not present

## 2022-10-25 DIAGNOSIS — Z993 Dependence on wheelchair: Secondary | ICD-10-CM | POA: Diagnosis not present

## 2022-10-25 DIAGNOSIS — Z794 Long term (current) use of insulin: Secondary | ICD-10-CM | POA: Diagnosis not present

## 2022-10-25 DIAGNOSIS — F32A Depression, unspecified: Secondary | ICD-10-CM | POA: Diagnosis not present

## 2022-10-25 DIAGNOSIS — Z87891 Personal history of nicotine dependence: Secondary | ICD-10-CM | POA: Diagnosis not present

## 2022-10-25 DIAGNOSIS — I110 Hypertensive heart disease with heart failure: Secondary | ICD-10-CM | POA: Diagnosis not present

## 2022-10-25 DIAGNOSIS — Z7902 Long term (current) use of antithrombotics/antiplatelets: Secondary | ICD-10-CM | POA: Diagnosis not present

## 2022-10-25 DIAGNOSIS — E785 Hyperlipidemia, unspecified: Secondary | ICD-10-CM | POA: Diagnosis not present

## 2022-10-25 DIAGNOSIS — M6282 Rhabdomyolysis: Secondary | ICD-10-CM | POA: Diagnosis not present

## 2022-10-25 DIAGNOSIS — E1169 Type 2 diabetes mellitus with other specified complication: Secondary | ICD-10-CM | POA: Diagnosis not present

## 2022-10-25 DIAGNOSIS — M6281 Muscle weakness (generalized): Secondary | ICD-10-CM | POA: Diagnosis not present

## 2022-10-25 DIAGNOSIS — Z9181 History of falling: Secondary | ICD-10-CM | POA: Diagnosis not present

## 2022-10-25 DIAGNOSIS — Z91148 Patient's other noncompliance with medication regimen for other reason: Secondary | ICD-10-CM | POA: Diagnosis not present

## 2022-10-25 DIAGNOSIS — I5032 Chronic diastolic (congestive) heart failure: Secondary | ICD-10-CM | POA: Diagnosis not present

## 2022-10-25 DIAGNOSIS — E1151 Type 2 diabetes mellitus with diabetic peripheral angiopathy without gangrene: Secondary | ICD-10-CM | POA: Diagnosis not present

## 2022-10-25 DIAGNOSIS — Z89512 Acquired absence of left leg below knee: Secondary | ICD-10-CM | POA: Diagnosis not present

## 2022-10-25 DIAGNOSIS — Z7985 Long-term (current) use of injectable non-insulin antidiabetic drugs: Secondary | ICD-10-CM | POA: Diagnosis not present

## 2022-10-25 DIAGNOSIS — J441 Chronic obstructive pulmonary disease with (acute) exacerbation: Secondary | ICD-10-CM | POA: Diagnosis not present

## 2022-10-25 NOTE — Telephone Encounter (Signed)
Attempted to call Patient-no answer or voicemail 

## 2022-10-25 NOTE — Telephone Encounter (Signed)
        Patient  visited [place] on 3/16    Telephone encounter attempt :  1st  A HIPAA compliant voice message was left requesting a return call.  Instructed patient to call back    [place] [phone] [address], [place], [place] [address] Phone: [phone] Email: [email or web address]

## 2022-10-26 ENCOUNTER — Telehealth: Payer: Self-pay | Admitting: *Deleted

## 2022-10-26 ENCOUNTER — Ambulatory Visit: Payer: 59 | Admitting: Family Medicine

## 2022-10-26 ENCOUNTER — Telehealth: Payer: Self-pay

## 2022-10-26 NOTE — Progress Notes (Signed)
  Care Coordination Note  10/26/2022 Name: Danny Spindel Overholt Sr. MRN: [id number] DOB: [birth year]  Danny Estimable Yokoyama Sr. is a 72 y.o. year old male who is a primary care patient of [name], MD and is actively engaged with the care management team. I reached out to [place]. by phone today to assist with re-scheduling an initial visit with the Licensed Clinical Social Worker  Follow up plan: We have been unable to make contact with the patient for follow up.   Julian Hy, [place] Direct Dial: [phone]

## 2022-10-26 NOTE — Telephone Encounter (Signed)
        Patient  visited [place] on 3/16     Telephone encounter attempt :  2nd Unable to leave a message    [place], [place] [phone] [address], [place], [place] [address] Phone: [phone] Email: [email or web address]

## 2022-10-29 ENCOUNTER — Telehealth: Payer: Self-pay

## 2022-10-29 DIAGNOSIS — I5032 Chronic diastolic (congestive) heart failure: Secondary | ICD-10-CM | POA: Diagnosis not present

## 2022-10-29 DIAGNOSIS — Z91148 Patient's other noncompliance with medication regimen for other reason: Secondary | ICD-10-CM

## 2022-10-29 DIAGNOSIS — F419 Anxiety disorder, unspecified: Secondary | ICD-10-CM

## 2022-10-29 DIAGNOSIS — E785 Hyperlipidemia, unspecified: Secondary | ICD-10-CM

## 2022-10-29 DIAGNOSIS — Z7985 Long-term (current) use of injectable non-insulin antidiabetic drugs: Secondary | ICD-10-CM

## 2022-10-29 DIAGNOSIS — Z7902 Long term (current) use of antithrombotics/antiplatelets: Secondary | ICD-10-CM

## 2022-10-29 DIAGNOSIS — Z87891 Personal history of nicotine dependence: Secondary | ICD-10-CM

## 2022-10-29 DIAGNOSIS — Z993 Dependence on wheelchair: Secondary | ICD-10-CM | POA: Diagnosis not present

## 2022-10-29 DIAGNOSIS — E1169 Type 2 diabetes mellitus with other specified complication: Secondary | ICD-10-CM | POA: Diagnosis not present

## 2022-10-29 DIAGNOSIS — Z9181 History of falling: Secondary | ICD-10-CM

## 2022-10-29 DIAGNOSIS — M6281 Muscle weakness (generalized): Secondary | ICD-10-CM | POA: Diagnosis not present

## 2022-10-29 DIAGNOSIS — J441 Chronic obstructive pulmonary disease with (acute) exacerbation: Secondary | ICD-10-CM | POA: Diagnosis not present

## 2022-10-29 DIAGNOSIS — I25118 Atherosclerotic heart disease of native coronary artery with other forms of angina pectoris: Secondary | ICD-10-CM

## 2022-10-29 DIAGNOSIS — I110 Hypertensive heart disease with heart failure: Secondary | ICD-10-CM | POA: Diagnosis not present

## 2022-10-29 DIAGNOSIS — F32A Depression, unspecified: Secondary | ICD-10-CM

## 2022-10-29 DIAGNOSIS — Z89512 Acquired absence of left leg below knee: Secondary | ICD-10-CM

## 2022-10-29 DIAGNOSIS — E1151 Type 2 diabetes mellitus with diabetic peripheral angiopathy without gangrene: Secondary | ICD-10-CM | POA: Diagnosis not present

## 2022-10-29 DIAGNOSIS — M6282 Rhabdomyolysis: Secondary | ICD-10-CM

## 2022-10-29 DIAGNOSIS — Z794 Long term (current) use of insulin: Secondary | ICD-10-CM

## 2022-10-29 NOTE — Telephone Encounter (Signed)
     Patient  visit on 3/16  at [place] you been able to follow up with your primary care physician?yes   The patient was or was not able to obtain any needed medicine or equipment. Yes   Are there diet recommendations that you are having difficulty following? Na   Patient expresses understanding of discharge instructions and education provided has no other needs at this time.  Yes    [place] [phone] [address], [place], [place] [address] Phone: [phone] Email: [email or web address]

## 2022-10-30 ENCOUNTER — Other Ambulatory Visit: Payer: Medicaid Other

## 2022-10-30 DIAGNOSIS — I110 Hypertensive heart disease with heart failure: Secondary | ICD-10-CM | POA: Diagnosis not present

## 2022-10-30 DIAGNOSIS — I25118 Atherosclerotic heart disease of native coronary artery with other forms of angina pectoris: Secondary | ICD-10-CM | POA: Diagnosis not present

## 2022-10-30 DIAGNOSIS — Z89512 Acquired absence of left leg below knee: Secondary | ICD-10-CM | POA: Diagnosis not present

## 2022-10-30 DIAGNOSIS — Z993 Dependence on wheelchair: Secondary | ICD-10-CM | POA: Diagnosis not present

## 2022-10-30 DIAGNOSIS — Z87891 Personal history of nicotine dependence: Secondary | ICD-10-CM | POA: Diagnosis not present

## 2022-10-30 DIAGNOSIS — E1151 Type 2 diabetes mellitus with diabetic peripheral angiopathy without gangrene: Secondary | ICD-10-CM | POA: Diagnosis not present

## 2022-10-30 DIAGNOSIS — E785 Hyperlipidemia, unspecified: Secondary | ICD-10-CM | POA: Diagnosis not present

## 2022-10-30 DIAGNOSIS — Z7985 Long-term (current) use of injectable non-insulin antidiabetic drugs: Secondary | ICD-10-CM | POA: Diagnosis not present

## 2022-10-30 DIAGNOSIS — F32A Depression, unspecified: Secondary | ICD-10-CM | POA: Diagnosis not present

## 2022-10-30 DIAGNOSIS — Z9181 History of falling: Secondary | ICD-10-CM | POA: Diagnosis not present

## 2022-10-30 DIAGNOSIS — Z515 Encounter for palliative care: Secondary | ICD-10-CM

## 2022-10-30 DIAGNOSIS — J441 Chronic obstructive pulmonary disease with (acute) exacerbation: Secondary | ICD-10-CM | POA: Diagnosis not present

## 2022-10-30 DIAGNOSIS — E1169 Type 2 diabetes mellitus with other specified complication: Secondary | ICD-10-CM | POA: Diagnosis not present

## 2022-10-30 DIAGNOSIS — M6281 Muscle weakness (generalized): Secondary | ICD-10-CM | POA: Diagnosis not present

## 2022-10-30 DIAGNOSIS — Z91148 Patient's other noncompliance with medication regimen for other reason: Secondary | ICD-10-CM | POA: Diagnosis not present

## 2022-10-30 DIAGNOSIS — Z7902 Long term (current) use of antithrombotics/antiplatelets: Secondary | ICD-10-CM | POA: Diagnosis not present

## 2022-10-30 DIAGNOSIS — Z794 Long term (current) use of insulin: Secondary | ICD-10-CM | POA: Diagnosis not present

## 2022-10-30 DIAGNOSIS — I5032 Chronic diastolic (congestive) heart failure: Secondary | ICD-10-CM | POA: Diagnosis not present

## 2022-10-30 DIAGNOSIS — M6282 Rhabdomyolysis: Secondary | ICD-10-CM | POA: Diagnosis not present

## 2022-10-30 NOTE — Telephone Encounter (Signed)
Attempted to call Patient-no answer or voicemail 

## 2022-10-30 NOTE — Progress Notes (Signed)
TELEPHONE ENCOUNTER  Palliative care SW connected with patient via telephone to follow up on missed appointment and missed phone calls.   Patient share that he has been feeling okay, having ups and downs with his breathing. Patient share that he had a nurse visiting him at the moment and did not have any concerns for SW at this time.   Patient open to SW re-scheduling home visit for 4/11 @12 .

## 2022-10-31 ENCOUNTER — Ambulatory Visit: Payer: Self-pay | Admitting: *Deleted

## 2022-10-31 NOTE — Patient Instructions (Signed)
Visit Information  Thank you for taking time to visit with me today. Please don't hesitate to contact me if I can be of assistance to you.   Following are the goals we discussed today:   Goals Addressed             This Visit's Progress    reliable transportation       Activities and task to complete in order to accomplish goals.   Confirm transportation company already in use to discuss challenges with pick up times Confirm referral for bath aid and agency they are from  [place] to confirm referral to [employer]        Our next appointment is by telephone on 11/06/22 at 10am  Please call the care guide team at [phone] if you need to cancel or reschedule your appointment.   If you are experiencing a Mental Health or [place] or need someone to talk to, please call 911   Patient verbalizes understanding of instructions and care plan provided today and agrees to view in [place]. Active MyChart status and patient understanding of how to access instructions and care plan via MyChart confirmed with patient.     Telephone follow up appointment with care management team member scheduled for:  11/06/22  Elliot Gurney, [place] Worker  [facility] Care Management [phone]

## 2022-10-31 NOTE — Patient Outreach (Signed)
  Care Coordination   Follow Up Visit Note   10/31/2022 Name: Danny Hofler Nygren Sr. MRN: [id number] DOB: 11-23-1950  Danny Estimable C[place] Sr. is a 72 y.o. year old male who sees Sonn[place], [name], MD for primary care. I spoke with  Danny Organ Sr. by phone today.  What matters to the patients health and wellness today?  Reliable transportation-Confirmed having limited support patient's son no longer resides with him and nephew resides in [place]. [place]   Goals Addressed             This Visit's Progress    reliable transportation       Activities and task to complete in order to accomplish goals.   Confirm transportation company already in use to discuss challenges with pick up times Confirm referral for bath aid and agency they are from  [place] to confirm referral to [employer] Whe[employer]        SDOH assessments and interventions completed:  Yes  SDOH Interventions Today    Flowsheet Row Most Recent Value  SDOH Interventions   Food Insecurity Interventions Other (Comment)  [receives food stamps, referred to meals on wheels]  Housing Interventions Intervention Not Indicated  Transportation Interventions Intervention Not Indicated  [uses medicaid transportation but sometimes come late to pick him up after appointments]        Care Coordination Interventions:  Yes, provided  Interventions Today    Flowsheet Row Most Recent Value  Chronic Disease   Chronic disease during today's visit Hypertension (HTN), Diabetes, Chronic Obstructive Pulmonary Disease (COPD)  General Interventions   General Interventions Discussed/Reviewed General Interventions Discussed, Community Resources, Level of Care  [Transportation resources discussed]  Level of [place]  [patient states that he has a bath aid coming Tuesday but is not sure from where, how long service will last]  Nutrition Interventions   Nutrition Discussed/Reviewed Nutrition Discussed  [Per patient, he has been referred to  Meals on Wheels]  Safety Interventions   Safety Discussed/Reviewed Safety Discussed, Home Safety  Home Safety Assistive Devices       Follow up plan: Follow up call scheduled for 11/06/22    Encounter Outcome:  Pt. Visit Completed

## 2022-11-02 ENCOUNTER — Telehealth: Payer: Self-pay | Admitting: Pharmacist

## 2022-11-02 ENCOUNTER — Other Ambulatory Visit: Payer: 59 | Admitting: Pharmacist

## 2022-11-02 NOTE — Telephone Encounter (Signed)
Contacted patient for medication review. He notes he cannot see to review his medications. He agreed to change our appointment to an in person appointment on Monday, April 8th at 3:30 pm in person at [facility]. He will order transportation.   Catie Hedwig Morton, PharmD, [place], [facility] [phone]

## 2022-11-05 NOTE — Telephone Encounter (Signed)
Yes you cn se Danny [name]'s rooms in [facility].

## 2022-11-06 ENCOUNTER — Encounter: Payer: Self-pay | Admitting: *Deleted

## 2022-11-06 ENCOUNTER — Telehealth: Payer: Self-pay | Admitting: *Deleted

## 2022-11-06 DIAGNOSIS — I5032 Chronic diastolic (congestive) heart failure: Secondary | ICD-10-CM | POA: Diagnosis not present

## 2022-11-06 DIAGNOSIS — I110 Hypertensive heart disease with heart failure: Secondary | ICD-10-CM | POA: Diagnosis not present

## 2022-11-06 DIAGNOSIS — Z7985 Long-term (current) use of injectable non-insulin antidiabetic drugs: Secondary | ICD-10-CM | POA: Diagnosis not present

## 2022-11-06 DIAGNOSIS — E1169 Type 2 diabetes mellitus with other specified complication: Secondary | ICD-10-CM | POA: Diagnosis not present

## 2022-11-06 DIAGNOSIS — Z7902 Long term (current) use of antithrombotics/antiplatelets: Secondary | ICD-10-CM | POA: Diagnosis not present

## 2022-11-06 DIAGNOSIS — F32A Depression, unspecified: Secondary | ICD-10-CM | POA: Diagnosis not present

## 2022-11-06 DIAGNOSIS — E785 Hyperlipidemia, unspecified: Secondary | ICD-10-CM | POA: Diagnosis not present

## 2022-11-06 DIAGNOSIS — J441 Chronic obstructive pulmonary disease with (acute) exacerbation: Secondary | ICD-10-CM | POA: Diagnosis not present

## 2022-11-06 DIAGNOSIS — Z993 Dependence on wheelchair: Secondary | ICD-10-CM | POA: Diagnosis not present

## 2022-11-06 DIAGNOSIS — M6282 Rhabdomyolysis: Secondary | ICD-10-CM | POA: Diagnosis not present

## 2022-11-06 DIAGNOSIS — Z89512 Acquired absence of left leg below knee: Secondary | ICD-10-CM | POA: Diagnosis not present

## 2022-11-06 DIAGNOSIS — Z87891 Personal history of nicotine dependence: Secondary | ICD-10-CM | POA: Diagnosis not present

## 2022-11-06 DIAGNOSIS — Z794 Long term (current) use of insulin: Secondary | ICD-10-CM | POA: Diagnosis not present

## 2022-11-06 DIAGNOSIS — I25118 Atherosclerotic heart disease of native coronary artery with other forms of angina pectoris: Secondary | ICD-10-CM | POA: Diagnosis not present

## 2022-11-06 DIAGNOSIS — M6281 Muscle weakness (generalized): Secondary | ICD-10-CM | POA: Diagnosis not present

## 2022-11-06 DIAGNOSIS — Z91148 Patient's other noncompliance with medication regimen for other reason: Secondary | ICD-10-CM | POA: Diagnosis not present

## 2022-11-06 DIAGNOSIS — Z9181 History of falling: Secondary | ICD-10-CM | POA: Diagnosis not present

## 2022-11-06 DIAGNOSIS — E1151 Type 2 diabetes mellitus with diabetic peripheral angiopathy without gangrene: Secondary | ICD-10-CM | POA: Diagnosis not present

## 2022-11-06 NOTE — Patient Outreach (Signed)
  Care Coordination   11/06/2022 Name: Danny Dame Placeres Sr. MRN: [id number] DOB: Jul 07, 1951   Care Coordination Outreach Attempts:  An unsuccessful telephone outreach was attempted for a scheduled appointment today.  Follow Up Plan:  Additional outreach attempts will be made to offer the patient care coordination information and services.   Encounter Outcome:  Pt. Visit Completed   Care Coordination Interventions:  No, not indicated    Krzysztof Reichelt, [place] Worker  [facility] Care Management [phone]

## 2022-11-08 DIAGNOSIS — M6282 Rhabdomyolysis: Secondary | ICD-10-CM | POA: Diagnosis not present

## 2022-11-08 DIAGNOSIS — Z89512 Acquired absence of left leg below knee: Secondary | ICD-10-CM | POA: Diagnosis not present

## 2022-11-08 DIAGNOSIS — Z7985 Long-term (current) use of injectable non-insulin antidiabetic drugs: Secondary | ICD-10-CM | POA: Diagnosis not present

## 2022-11-08 DIAGNOSIS — Z9181 History of falling: Secondary | ICD-10-CM | POA: Diagnosis not present

## 2022-11-08 DIAGNOSIS — Z993 Dependence on wheelchair: Secondary | ICD-10-CM | POA: Diagnosis not present

## 2022-11-08 DIAGNOSIS — E1151 Type 2 diabetes mellitus with diabetic peripheral angiopathy without gangrene: Secondary | ICD-10-CM | POA: Diagnosis not present

## 2022-11-08 DIAGNOSIS — E785 Hyperlipidemia, unspecified: Secondary | ICD-10-CM | POA: Diagnosis not present

## 2022-11-08 DIAGNOSIS — Z7902 Long term (current) use of antithrombotics/antiplatelets: Secondary | ICD-10-CM | POA: Diagnosis not present

## 2022-11-08 DIAGNOSIS — Z794 Long term (current) use of insulin: Secondary | ICD-10-CM | POA: Diagnosis not present

## 2022-11-08 DIAGNOSIS — Z87891 Personal history of nicotine dependence: Secondary | ICD-10-CM | POA: Diagnosis not present

## 2022-11-08 DIAGNOSIS — E1169 Type 2 diabetes mellitus with other specified complication: Secondary | ICD-10-CM | POA: Diagnosis not present

## 2022-11-08 DIAGNOSIS — I110 Hypertensive heart disease with heart failure: Secondary | ICD-10-CM | POA: Diagnosis not present

## 2022-11-08 DIAGNOSIS — Z91148 Patient's other noncompliance with medication regimen for other reason: Secondary | ICD-10-CM | POA: Diagnosis not present

## 2022-11-08 DIAGNOSIS — J441 Chronic obstructive pulmonary disease with (acute) exacerbation: Secondary | ICD-10-CM | POA: Diagnosis not present

## 2022-11-08 DIAGNOSIS — I5032 Chronic diastolic (congestive) heart failure: Secondary | ICD-10-CM | POA: Diagnosis not present

## 2022-11-08 DIAGNOSIS — M6281 Muscle weakness (generalized): Secondary | ICD-10-CM | POA: Diagnosis not present

## 2022-11-08 DIAGNOSIS — I25118 Atherosclerotic heart disease of native coronary artery with other forms of angina pectoris: Secondary | ICD-10-CM | POA: Diagnosis not present

## 2022-11-08 DIAGNOSIS — F32A Depression, unspecified: Secondary | ICD-10-CM | POA: Diagnosis not present

## 2022-11-12 ENCOUNTER — Other Ambulatory Visit: Payer: Self-pay | Admitting: Family Medicine

## 2022-11-12 ENCOUNTER — Telehealth: Payer: Self-pay

## 2022-11-12 ENCOUNTER — Ambulatory Visit: Payer: 59

## 2022-11-12 ENCOUNTER — Other Ambulatory Visit: Payer: 59 | Admitting: Pharmacist

## 2022-11-12 ENCOUNTER — Encounter: Payer: Self-pay | Admitting: Family Medicine

## 2022-11-12 ENCOUNTER — Telehealth: Payer: Self-pay | Admitting: Pharmacist

## 2022-11-12 DIAGNOSIS — E1169 Type 2 diabetes mellitus with other specified complication: Secondary | ICD-10-CM

## 2022-11-12 DIAGNOSIS — B372 Candidiasis of skin and nail: Secondary | ICD-10-CM

## 2022-11-12 NOTE — Progress Notes (Signed)
Attempted to contact patient to confirm scheduled in person appointment for medication management. Unable to leave HIPAA compliant message for patient to return my call at their convenience.   Will attempt call again later this morning.   Catie Eppie Gibson, PharmD, BCACP, CPP [facility] [phone]

## 2022-11-12 NOTE — Telephone Encounter (Signed)
Left voicemail for patient (on mobile phone, no v-mail on home phone) asking him to pls call us back. Sent letter to patient via MyChart.  When patient calls back, we need to verify that this date/time works with his schedule.  Also, we need to find out why he has scheduled a hospital follow-up visit with Dr. Marikay Alar on 12/10/2022 (patient scheduled this visit via MyChart).

## 2022-11-12 NOTE — Progress Notes (Signed)
Connected with patient. He is unable to get in today. He has HH RN coming tomorrow. He will get her phone number and I will connect with her to complete a medication review at a later date this week.   He has a home visit from Palliative SW on Thursday.   Catie Eppie Gibson, PharmD, BCACP, CPP [facility] [phone]

## 2022-11-13 ENCOUNTER — Other Ambulatory Visit: Payer: 59 | Admitting: Pharmacist

## 2022-11-13 DIAGNOSIS — Z9181 History of falling: Secondary | ICD-10-CM | POA: Diagnosis not present

## 2022-11-13 DIAGNOSIS — Z91148 Patient's other noncompliance with medication regimen for other reason: Secondary | ICD-10-CM | POA: Diagnosis not present

## 2022-11-13 DIAGNOSIS — Z89512 Acquired absence of left leg below knee: Secondary | ICD-10-CM | POA: Diagnosis not present

## 2022-11-13 DIAGNOSIS — I110 Hypertensive heart disease with heart failure: Secondary | ICD-10-CM | POA: Diagnosis not present

## 2022-11-13 DIAGNOSIS — Z7985 Long-term (current) use of injectable non-insulin antidiabetic drugs: Secondary | ICD-10-CM | POA: Diagnosis not present

## 2022-11-13 DIAGNOSIS — Z794 Long term (current) use of insulin: Secondary | ICD-10-CM | POA: Diagnosis not present

## 2022-11-13 DIAGNOSIS — E1169 Type 2 diabetes mellitus with other specified complication: Secondary | ICD-10-CM | POA: Diagnosis not present

## 2022-11-13 DIAGNOSIS — F32A Depression, unspecified: Secondary | ICD-10-CM | POA: Diagnosis not present

## 2022-11-13 DIAGNOSIS — M6281 Muscle weakness (generalized): Secondary | ICD-10-CM | POA: Diagnosis not present

## 2022-11-13 DIAGNOSIS — Z993 Dependence on wheelchair: Secondary | ICD-10-CM | POA: Diagnosis not present

## 2022-11-13 DIAGNOSIS — M6282 Rhabdomyolysis: Secondary | ICD-10-CM | POA: Diagnosis not present

## 2022-11-13 DIAGNOSIS — J441 Chronic obstructive pulmonary disease with (acute) exacerbation: Secondary | ICD-10-CM | POA: Diagnosis not present

## 2022-11-13 DIAGNOSIS — I5032 Chronic diastolic (congestive) heart failure: Secondary | ICD-10-CM | POA: Diagnosis not present

## 2022-11-13 DIAGNOSIS — Z87891 Personal history of nicotine dependence: Secondary | ICD-10-CM | POA: Diagnosis not present

## 2022-11-13 DIAGNOSIS — I25118 Atherosclerotic heart disease of native coronary artery with other forms of angina pectoris: Secondary | ICD-10-CM | POA: Diagnosis not present

## 2022-11-13 DIAGNOSIS — E785 Hyperlipidemia, unspecified: Secondary | ICD-10-CM | POA: Diagnosis not present

## 2022-11-13 DIAGNOSIS — E1151 Type 2 diabetes mellitus with diabetic peripheral angiopathy without gangrene: Secondary | ICD-10-CM | POA: Diagnosis not present

## 2022-11-13 DIAGNOSIS — Z7902 Long term (current) use of antithrombotics/antiplatelets: Secondary | ICD-10-CM | POA: Diagnosis not present

## 2022-11-13 NOTE — Progress Notes (Signed)
Attempted to contact patient to obtain [facility] RN phone number as discussed. Will coordinate with SW for med review.   Catie Eppie Gibson, PharmD, BCACP, CPP [facility] [phone]

## 2022-11-14 DIAGNOSIS — M6281 Muscle weakness (generalized): Secondary | ICD-10-CM | POA: Diagnosis not present

## 2022-11-14 DIAGNOSIS — I5032 Chronic diastolic (congestive) heart failure: Secondary | ICD-10-CM | POA: Diagnosis not present

## 2022-11-14 DIAGNOSIS — Z87891 Personal history of nicotine dependence: Secondary | ICD-10-CM | POA: Diagnosis not present

## 2022-11-14 DIAGNOSIS — Z89512 Acquired absence of left leg below knee: Secondary | ICD-10-CM | POA: Diagnosis not present

## 2022-11-14 DIAGNOSIS — Z7902 Long term (current) use of antithrombotics/antiplatelets: Secondary | ICD-10-CM | POA: Diagnosis not present

## 2022-11-14 DIAGNOSIS — I25118 Atherosclerotic heart disease of native coronary artery with other forms of angina pectoris: Secondary | ICD-10-CM | POA: Diagnosis not present

## 2022-11-14 DIAGNOSIS — F32A Depression, unspecified: Secondary | ICD-10-CM | POA: Diagnosis not present

## 2022-11-14 DIAGNOSIS — Z91148 Patient's other noncompliance with medication regimen for other reason: Secondary | ICD-10-CM | POA: Diagnosis not present

## 2022-11-14 DIAGNOSIS — Z794 Long term (current) use of insulin: Secondary | ICD-10-CM | POA: Diagnosis not present

## 2022-11-14 DIAGNOSIS — E1151 Type 2 diabetes mellitus with diabetic peripheral angiopathy without gangrene: Secondary | ICD-10-CM | POA: Diagnosis not present

## 2022-11-14 DIAGNOSIS — Z9181 History of falling: Secondary | ICD-10-CM | POA: Diagnosis not present

## 2022-11-14 DIAGNOSIS — E785 Hyperlipidemia, unspecified: Secondary | ICD-10-CM | POA: Diagnosis not present

## 2022-11-14 DIAGNOSIS — I110 Hypertensive heart disease with heart failure: Secondary | ICD-10-CM | POA: Diagnosis not present

## 2022-11-14 DIAGNOSIS — E1169 Type 2 diabetes mellitus with other specified complication: Secondary | ICD-10-CM | POA: Diagnosis not present

## 2022-11-14 DIAGNOSIS — J441 Chronic obstructive pulmonary disease with (acute) exacerbation: Secondary | ICD-10-CM | POA: Diagnosis not present

## 2022-11-14 DIAGNOSIS — Z7985 Long-term (current) use of injectable non-insulin antidiabetic drugs: Secondary | ICD-10-CM | POA: Diagnosis not present

## 2022-11-14 DIAGNOSIS — M6282 Rhabdomyolysis: Secondary | ICD-10-CM | POA: Diagnosis not present

## 2022-11-14 DIAGNOSIS — Z993 Dependence on wheelchair: Secondary | ICD-10-CM | POA: Diagnosis not present

## 2022-11-14 NOTE — Telephone Encounter (Signed)
Please call the patient and see if he is taking Mounjaro or Trulicity.  If he is taking the [facility] please find out what dose he is currently taking.  Please see why he needs a refill on the nystatin.  Thanks.

## 2022-11-15 ENCOUNTER — Telehealth: Payer: Self-pay | Admitting: Family Medicine

## 2022-11-15 ENCOUNTER — Other Ambulatory Visit: Payer: Medicaid Other

## 2022-11-15 ENCOUNTER — Ambulatory Visit: Payer: 59

## 2022-11-15 DIAGNOSIS — Z794 Long term (current) use of insulin: Secondary | ICD-10-CM | POA: Diagnosis not present

## 2022-11-15 DIAGNOSIS — Z515 Encounter for palliative care: Secondary | ICD-10-CM

## 2022-11-15 DIAGNOSIS — Z91148 Patient's other noncompliance with medication regimen for other reason: Secondary | ICD-10-CM | POA: Diagnosis not present

## 2022-11-15 DIAGNOSIS — I110 Hypertensive heart disease with heart failure: Secondary | ICD-10-CM | POA: Diagnosis not present

## 2022-11-15 DIAGNOSIS — J441 Chronic obstructive pulmonary disease with (acute) exacerbation: Secondary | ICD-10-CM | POA: Diagnosis not present

## 2022-11-15 DIAGNOSIS — Z87891 Personal history of nicotine dependence: Secondary | ICD-10-CM | POA: Diagnosis not present

## 2022-11-15 DIAGNOSIS — I25118 Atherosclerotic heart disease of native coronary artery with other forms of angina pectoris: Secondary | ICD-10-CM | POA: Diagnosis not present

## 2022-11-15 DIAGNOSIS — E1169 Type 2 diabetes mellitus with other specified complication: Secondary | ICD-10-CM | POA: Diagnosis not present

## 2022-11-15 DIAGNOSIS — Z993 Dependence on wheelchair: Secondary | ICD-10-CM | POA: Diagnosis not present

## 2022-11-15 DIAGNOSIS — M6281 Muscle weakness (generalized): Secondary | ICD-10-CM | POA: Diagnosis not present

## 2022-11-15 DIAGNOSIS — Z89512 Acquired absence of left leg below knee: Secondary | ICD-10-CM | POA: Diagnosis not present

## 2022-11-15 DIAGNOSIS — Z7985 Long-term (current) use of injectable non-insulin antidiabetic drugs: Secondary | ICD-10-CM | POA: Diagnosis not present

## 2022-11-15 DIAGNOSIS — M6282 Rhabdomyolysis: Secondary | ICD-10-CM | POA: Diagnosis not present

## 2022-11-15 DIAGNOSIS — F32A Depression, unspecified: Secondary | ICD-10-CM | POA: Diagnosis not present

## 2022-11-15 DIAGNOSIS — E1151 Type 2 diabetes mellitus with diabetic peripheral angiopathy without gangrene: Secondary | ICD-10-CM | POA: Diagnosis not present

## 2022-11-15 DIAGNOSIS — Z9181 History of falling: Secondary | ICD-10-CM | POA: Diagnosis not present

## 2022-11-15 DIAGNOSIS — Z7902 Long term (current) use of antithrombotics/antiplatelets: Secondary | ICD-10-CM | POA: Diagnosis not present

## 2022-11-15 DIAGNOSIS — E785 Hyperlipidemia, unspecified: Secondary | ICD-10-CM | POA: Diagnosis not present

## 2022-11-15 DIAGNOSIS — I5032 Chronic diastolic (congestive) heart failure: Secondary | ICD-10-CM | POA: Diagnosis not present

## 2022-11-15 NOTE — Progress Notes (Signed)
COMMUNITY PALLIATIVE CARE SW NOTE  PATIENT NAME: Danny KNABLE Sr. DOB: 11-18-1950 MRN: [id number]  PRIMARY CARE PROVIDER: [name], MD  RESPONSIBLE PARTY:  Acct ID - Guarantor Home Phone Work Phone Relationship Acct Type  [id number] Danny [name]* [phone]  Self P/F     [address] [place], [place] [address]     PLAN OF CARE and INTERVENTIONS:      GOALS OF CARE/ ADVANCE CARE PLANNING:    Goals include to maximize quality of life and assist with pain management. Our advance care planning conversation included a discussion about:    The value and importance of advance care planning  Review and updating or creation of an advance directive document.                          Code Status: FULL CODE.                          ACD: none in place.                         GOC: ongoing discussion.  2.        SOCIAL/EMOTIONAL/SPIRITUAL ASSESSMENT/ INTERVENTIONS:         Palliative care encounter: SW completed initial in home visit with patient. PC criteria reviewed and discussed.   Presenting problem: patient with BKA of L leg, CAD, CHF, CKD, COPD, hypertension, hyperlipidemia, and insulin-dependent type 2 diabetes. Patient received sitting in WC in living room. Patient in good spirits and states his son just dropped him off lunch.  Mobility: patient is mostly WC bound due to leg amputation. Patient states he is able to transfer to and from [facility] without assistance. Patient wears briefs of which is changes himself due to not being able to fit is WC in his bathroom to transfer to toilet. Patient denies a BSC at this time and states its easier for him to use and change brief. Patients home does have slight urine odor. Patient is receiving PT/OT/RN services through [facility].  Medications: patient missed appointment today with PCP office but was told that a [facility] RN could review his medications with PCP pharmacist. SW outreached [facility] RN, Danny [name], to make her aware of pharmacist  request and left pharmacist name and contact information Danny [name], [phone]) with Danny [name] receptionist, whom shared that Danny [name] is scheduled to visit patient today.  Type ll diabetes: patients BS was 340 today during visit. Patient self-administered 11units of Humalog. By end of visits patients BS had gone down to 319. Patient shared he will continue to monitor.  Sleeping: no concerns.   Appetite: Patient share his appetite is fair-poor.    Psychosocial assessment: completed.   In home support: Patient lives independently. Patient states he would like assistance in the home with light house keeping and bathing, due to him not being able to get into and maneuver in his bathroom without assistance due to the size of it.  Transportation: no needs. Patient uses [employer] and states his nephew takes him to appointments sometimes.  Food: no food insecurities witnessed.   Resources: patient has open APS case with [employer]. SW to outreach case worker to update on todays visit and request a PCS application be completed.  Safety and long term planning: patient feels safe in his home and desires to remain in his home. Discussed life alert.  SW discussed goals, reviewed care plan, provided emotional support, used active and reflective listening in the form of reciprocity emotional response. Questions and concerns were addressed. The patient was encouraged to call with any additional questions and/or concerns. PC Provided general support and encouragement, no other unmet needs identified.   PC will follow up in 3-4 weeks.   3.         PATIENT/CAREGIVER EDUCATION/ COPING:   Appearance: well groomed, appropriate given situation  Mental Status: Alert and oriented. Eye Contact: good  Thought Process: rational  Thought Content: not assessed  Speech: good/clear  Mood: Normal and calm Affect: Congruent to endorsed mood, full ranging Insight: good Judgement: good Interaction  Style: Cooperative   Patient A&O and able to make needs known. Patient able to engage in conversation and answer all questions appropriately. PHQ9: 0. Patient denies S/S of anxiety and depression.    4.         PERSONAL EMERGENCY PLAN:  Patient will call 9-1-1 for emergencies.    5.         COMMUNITY RESOURCES COORDINATION/ HEALTH CARE NAVIGATION:  patient manages her care.    6.      FINANCIAL CONCERNS/NEEDS: none                         Primary Health Insurance: Medicaid Secondary Health Insurance: none Prescription Coverage: Yes, no history of difficulty obtaining or affording prescriptions reported.     SOCIAL HX:  Social History   Tobacco Use   Smoking status: Former    Packs/day: 3.00    Years: 0.00    Additional pack years: 0.00    Total pack years: 0.00    Types: Cigarettes    Quit date: 09/07/2009    Years since quitting: 13.1   Smokeless tobacco: Never   Tobacco comments:    quit june 2011  Substance Use Topics   Alcohol use: Not Currently    Alcohol/week: 7.0 standard drinks of alcohol    Types: 6 Cans of beer, 1 Standard drinks or equivalent per week    Comment: last week 07/26/2022    CODE STATUS: FULL CODE ADVANCED DIRECTIVES: N MOST FORM COMPLETE: N HOSPICE EDUCATION PROVIDED: N  PPS:40%   TIME SPENT:   Danny [name], [place]

## 2022-11-15 NOTE — Telephone Encounter (Signed)
Aldrid a home health nurse from [facility] called in staying that she would like to go over pt meds with either provider or his assistant. She's available [phone]

## 2022-11-16 DIAGNOSIS — J449 Chronic obstructive pulmonary disease, unspecified: Secondary | ICD-10-CM | POA: Diagnosis not present

## 2022-11-19 ENCOUNTER — Other Ambulatory Visit: Payer: Self-pay | Admitting: Pharmacist

## 2022-11-19 DIAGNOSIS — E785 Hyperlipidemia, unspecified: Secondary | ICD-10-CM

## 2022-11-19 DIAGNOSIS — J432 Centrilobular emphysema: Secondary | ICD-10-CM

## 2022-11-19 DIAGNOSIS — E1169 Type 2 diabetes mellitus with other specified complication: Secondary | ICD-10-CM

## 2022-11-19 DIAGNOSIS — I25118 Atherosclerotic heart disease of native coronary artery with other forms of angina pectoris: Secondary | ICD-10-CM

## 2022-11-19 MED ORDER — PROAIR RESPICLICK 108 (90 BASE) MCG/ACT IN AEPB: 1.0000 | INHALATION_SPRAY | Freq: Four times a day (QID) | RESPIRATORY_TRACT | 2 refills | Status: DC | PRN

## 2022-11-19 MED ORDER — TRULICITY 4.5 MG/0.5ML ~~LOC~~ SOAJ: 4.5000 mg | SUBCUTANEOUS | 1 refills | Status: DC

## 2022-11-19 MED ORDER — CARVEDILOL 25 MG PO TABS: 25.0000 mg | ORAL_TABLET | Freq: Two times a day (BID) | ORAL | 1 refills | Status: DC

## 2022-11-19 MED ORDER — AMLODIPINE BESYLATE 10 MG PO TABS: 10.0000 mg | ORAL_TABLET | Freq: Every day | ORAL | 1 refills | Status: DC

## 2022-11-19 MED ORDER — ATORVASTATIN CALCIUM 80 MG PO TABS: 80.0000 mg | ORAL_TABLET | Freq: Every day | ORAL | 3 refills | Status: DC

## 2022-11-19 MED ORDER — ESCITALOPRAM OXALATE 10 MG PO TABS: 10.0000 mg | ORAL_TABLET | Freq: Every day | ORAL | 1 refills | Status: DC

## 2022-11-19 MED ORDER — EMPAGLIFLOZIN 25 MG PO TABS: 25.0000 mg | ORAL_TABLET | Freq: Every day | ORAL | 3 refills | Status: DC

## 2022-11-19 MED ORDER — CLOPIDOGREL BISULFATE 75 MG PO TABS: 75.0000 mg | ORAL_TABLET | Freq: Every day | ORAL | 1 refills | Status: DC

## 2022-11-19 NOTE — Progress Notes (Unsigned)
Care Coordination Call  Spoke with [facility] RN Magda Paganini with [facility] and reviewed patient medications. We reviewed those that needed refills.   She was not sure the next day she would be seeing him, but she plans to call me when that is scheduled and we can coordinate her reading Libre CGM data to me.   Catie Eppie Gibson, PharmD, BCACP, CPP [facility] [phone]

## 2022-11-19 NOTE — Telephone Encounter (Signed)
Returning call today.   Catie Eppie Gibson, PharmD, BCACP, CPP [facility] [phone]

## 2022-11-21 ENCOUNTER — Telehealth: Payer: Self-pay | Admitting: Family Medicine

## 2022-11-21 DIAGNOSIS — E785 Hyperlipidemia, unspecified: Secondary | ICD-10-CM | POA: Diagnosis not present

## 2022-11-21 DIAGNOSIS — I5032 Chronic diastolic (congestive) heart failure: Secondary | ICD-10-CM | POA: Diagnosis not present

## 2022-11-21 DIAGNOSIS — I110 Hypertensive heart disease with heart failure: Secondary | ICD-10-CM | POA: Diagnosis not present

## 2022-11-21 DIAGNOSIS — Z794 Long term (current) use of insulin: Secondary | ICD-10-CM | POA: Diagnosis not present

## 2022-11-21 DIAGNOSIS — Z7902 Long term (current) use of antithrombotics/antiplatelets: Secondary | ICD-10-CM | POA: Diagnosis not present

## 2022-11-21 DIAGNOSIS — J441 Chronic obstructive pulmonary disease with (acute) exacerbation: Secondary | ICD-10-CM | POA: Diagnosis not present

## 2022-11-21 DIAGNOSIS — M6282 Rhabdomyolysis: Secondary | ICD-10-CM | POA: Diagnosis not present

## 2022-11-21 DIAGNOSIS — Z89512 Acquired absence of left leg below knee: Secondary | ICD-10-CM | POA: Diagnosis not present

## 2022-11-21 DIAGNOSIS — F32A Depression, unspecified: Secondary | ICD-10-CM | POA: Diagnosis not present

## 2022-11-21 DIAGNOSIS — Z91148 Patient's other noncompliance with medication regimen for other reason: Secondary | ICD-10-CM | POA: Diagnosis not present

## 2022-11-21 DIAGNOSIS — Z993 Dependence on wheelchair: Secondary | ICD-10-CM | POA: Diagnosis not present

## 2022-11-21 DIAGNOSIS — Z87891 Personal history of nicotine dependence: Secondary | ICD-10-CM | POA: Diagnosis not present

## 2022-11-21 DIAGNOSIS — E1151 Type 2 diabetes mellitus with diabetic peripheral angiopathy without gangrene: Secondary | ICD-10-CM | POA: Diagnosis not present

## 2022-11-21 DIAGNOSIS — I25118 Atherosclerotic heart disease of native coronary artery with other forms of angina pectoris: Secondary | ICD-10-CM | POA: Diagnosis not present

## 2022-11-21 DIAGNOSIS — M6281 Muscle weakness (generalized): Secondary | ICD-10-CM | POA: Diagnosis not present

## 2022-11-21 DIAGNOSIS — Z9181 History of falling: Secondary | ICD-10-CM | POA: Diagnosis not present

## 2022-11-21 DIAGNOSIS — Z7985 Long-term (current) use of injectable non-insulin antidiabetic drugs: Secondary | ICD-10-CM | POA: Diagnosis not present

## 2022-11-21 DIAGNOSIS — E1169 Type 2 diabetes mellitus with other specified complication: Secondary | ICD-10-CM | POA: Diagnosis not present

## 2022-11-21 NOTE — Telephone Encounter (Signed)
New message   Home health at the home now .   C/o heart rate outside the MD parameter hr  108 -6 beats a min.   Now resting  between  101-108.  temp 97.1

## 2022-11-21 NOTE — Telephone Encounter (Signed)
Noted  

## 2022-11-21 NOTE — Telephone Encounter (Signed)
Danny [name] at Danny [name] and let her know Dr. Purvis Sheffield recommendations

## 2022-11-21 NOTE — Telephone Encounter (Signed)
Noreene Larsson at [place] states the initial heart rate was 108 and the Patient stated he had just been sitting there but then after the recheck he stated he had been up getting ready and straightening up. Noreene Larsson at [facility] has left the Patient's house but she is going to pass the message on to the nurse to recheck the Patient's heart rate.

## 2022-11-21 NOTE — Telephone Encounter (Signed)
Noted. Those pulse rates are not terribly out of range and can be monitored. I would encourage rechecking later today if possible.

## 2022-11-21 NOTE — Telephone Encounter (Signed)
Sorry can you call her back an confirm the heart rate number that they got on their evaluation? The resting HR is not terribly abnormal, though the initial heart rate in the message is not clear on what the HR actually was. Thanks.

## 2022-11-22 DIAGNOSIS — I25118 Atherosclerotic heart disease of native coronary artery with other forms of angina pectoris: Secondary | ICD-10-CM | POA: Diagnosis not present

## 2022-11-22 DIAGNOSIS — Z91148 Patient's other noncompliance with medication regimen for other reason: Secondary | ICD-10-CM | POA: Diagnosis not present

## 2022-11-22 DIAGNOSIS — I110 Hypertensive heart disease with heart failure: Secondary | ICD-10-CM | POA: Diagnosis not present

## 2022-11-22 DIAGNOSIS — Z993 Dependence on wheelchair: Secondary | ICD-10-CM | POA: Diagnosis not present

## 2022-11-22 DIAGNOSIS — Z89512 Acquired absence of left leg below knee: Secondary | ICD-10-CM | POA: Diagnosis not present

## 2022-11-22 DIAGNOSIS — M6281 Muscle weakness (generalized): Secondary | ICD-10-CM | POA: Diagnosis not present

## 2022-11-22 DIAGNOSIS — Z87891 Personal history of nicotine dependence: Secondary | ICD-10-CM | POA: Diagnosis not present

## 2022-11-22 DIAGNOSIS — I5032 Chronic diastolic (congestive) heart failure: Secondary | ICD-10-CM | POA: Diagnosis not present

## 2022-11-22 DIAGNOSIS — E1169 Type 2 diabetes mellitus with other specified complication: Secondary | ICD-10-CM | POA: Diagnosis not present

## 2022-11-22 DIAGNOSIS — Z7985 Long-term (current) use of injectable non-insulin antidiabetic drugs: Secondary | ICD-10-CM | POA: Diagnosis not present

## 2022-11-22 DIAGNOSIS — E785 Hyperlipidemia, unspecified: Secondary | ICD-10-CM | POA: Diagnosis not present

## 2022-11-22 DIAGNOSIS — M6282 Rhabdomyolysis: Secondary | ICD-10-CM | POA: Diagnosis not present

## 2022-11-22 DIAGNOSIS — Z794 Long term (current) use of insulin: Secondary | ICD-10-CM | POA: Diagnosis not present

## 2022-11-22 DIAGNOSIS — E1151 Type 2 diabetes mellitus with diabetic peripheral angiopathy without gangrene: Secondary | ICD-10-CM | POA: Diagnosis not present

## 2022-11-22 DIAGNOSIS — J441 Chronic obstructive pulmonary disease with (acute) exacerbation: Secondary | ICD-10-CM | POA: Diagnosis not present

## 2022-11-22 DIAGNOSIS — Z9181 History of falling: Secondary | ICD-10-CM | POA: Diagnosis not present

## 2022-11-22 DIAGNOSIS — F32A Depression, unspecified: Secondary | ICD-10-CM | POA: Diagnosis not present

## 2022-11-22 DIAGNOSIS — Z7902 Long term (current) use of antithrombotics/antiplatelets: Secondary | ICD-10-CM | POA: Diagnosis not present

## 2022-11-23 DIAGNOSIS — M6281 Muscle weakness (generalized): Secondary | ICD-10-CM | POA: Diagnosis not present

## 2022-11-23 DIAGNOSIS — I619 Nontraumatic intracerebral hemorrhage, unspecified: Secondary | ICD-10-CM | POA: Diagnosis not present

## 2022-11-26 DIAGNOSIS — E785 Hyperlipidemia, unspecified: Secondary | ICD-10-CM | POA: Diagnosis not present

## 2022-11-26 DIAGNOSIS — I5032 Chronic diastolic (congestive) heart failure: Secondary | ICD-10-CM | POA: Diagnosis not present

## 2022-11-26 DIAGNOSIS — Z9181 History of falling: Secondary | ICD-10-CM | POA: Diagnosis not present

## 2022-11-26 DIAGNOSIS — M6282 Rhabdomyolysis: Secondary | ICD-10-CM | POA: Diagnosis not present

## 2022-11-26 DIAGNOSIS — E1169 Type 2 diabetes mellitus with other specified complication: Secondary | ICD-10-CM | POA: Diagnosis not present

## 2022-11-26 DIAGNOSIS — Z993 Dependence on wheelchair: Secondary | ICD-10-CM | POA: Diagnosis not present

## 2022-11-26 DIAGNOSIS — Z7985 Long-term (current) use of injectable non-insulin antidiabetic drugs: Secondary | ICD-10-CM | POA: Diagnosis not present

## 2022-11-26 DIAGNOSIS — J441 Chronic obstructive pulmonary disease with (acute) exacerbation: Secondary | ICD-10-CM | POA: Diagnosis not present

## 2022-11-26 DIAGNOSIS — Z91148 Patient's other noncompliance with medication regimen for other reason: Secondary | ICD-10-CM | POA: Diagnosis not present

## 2022-11-26 DIAGNOSIS — Z7902 Long term (current) use of antithrombotics/antiplatelets: Secondary | ICD-10-CM | POA: Diagnosis not present

## 2022-11-26 DIAGNOSIS — Z794 Long term (current) use of insulin: Secondary | ICD-10-CM | POA: Diagnosis not present

## 2022-11-26 DIAGNOSIS — F32A Depression, unspecified: Secondary | ICD-10-CM | POA: Diagnosis not present

## 2022-11-26 DIAGNOSIS — I25118 Atherosclerotic heart disease of native coronary artery with other forms of angina pectoris: Secondary | ICD-10-CM | POA: Diagnosis not present

## 2022-11-26 DIAGNOSIS — E1151 Type 2 diabetes mellitus with diabetic peripheral angiopathy without gangrene: Secondary | ICD-10-CM | POA: Diagnosis not present

## 2022-11-26 DIAGNOSIS — I110 Hypertensive heart disease with heart failure: Secondary | ICD-10-CM | POA: Diagnosis not present

## 2022-11-26 DIAGNOSIS — Z87891 Personal history of nicotine dependence: Secondary | ICD-10-CM | POA: Diagnosis not present

## 2022-11-26 DIAGNOSIS — Z89512 Acquired absence of left leg below knee: Secondary | ICD-10-CM | POA: Diagnosis not present

## 2022-11-26 DIAGNOSIS — M6281 Muscle weakness (generalized): Secondary | ICD-10-CM | POA: Diagnosis not present

## 2022-11-27 ENCOUNTER — Telehealth: Payer: Self-pay

## 2022-11-27 NOTE — Telephone Encounter (Signed)
Telephone call to patients APS caseworker, Sharlene Motts [phone].     To make aware of PC recent visit and requested PCS services be arranged for patient. SW was told that the caseworker will review request with her supervisor.

## 2022-11-28 DIAGNOSIS — M6281 Muscle weakness (generalized): Secondary | ICD-10-CM | POA: Diagnosis not present

## 2022-11-28 DIAGNOSIS — E1151 Type 2 diabetes mellitus with diabetic peripheral angiopathy without gangrene: Secondary | ICD-10-CM | POA: Diagnosis not present

## 2022-11-28 DIAGNOSIS — Z89512 Acquired absence of left leg below knee: Secondary | ICD-10-CM | POA: Diagnosis not present

## 2022-11-28 DIAGNOSIS — E1169 Type 2 diabetes mellitus with other specified complication: Secondary | ICD-10-CM | POA: Diagnosis not present

## 2022-11-28 DIAGNOSIS — J441 Chronic obstructive pulmonary disease with (acute) exacerbation: Secondary | ICD-10-CM | POA: Diagnosis not present

## 2022-11-28 DIAGNOSIS — E785 Hyperlipidemia, unspecified: Secondary | ICD-10-CM | POA: Diagnosis not present

## 2022-11-28 DIAGNOSIS — M6282 Rhabdomyolysis: Secondary | ICD-10-CM | POA: Diagnosis not present

## 2022-11-28 DIAGNOSIS — Z91148 Patient's other noncompliance with medication regimen for other reason: Secondary | ICD-10-CM | POA: Diagnosis not present

## 2022-11-28 DIAGNOSIS — Z87891 Personal history of nicotine dependence: Secondary | ICD-10-CM | POA: Diagnosis not present

## 2022-11-28 DIAGNOSIS — Z7902 Long term (current) use of antithrombotics/antiplatelets: Secondary | ICD-10-CM | POA: Diagnosis not present

## 2022-11-28 DIAGNOSIS — F32A Depression, unspecified: Secondary | ICD-10-CM | POA: Diagnosis not present

## 2022-11-28 DIAGNOSIS — Z9181 History of falling: Secondary | ICD-10-CM | POA: Diagnosis not present

## 2022-11-28 DIAGNOSIS — Z993 Dependence on wheelchair: Secondary | ICD-10-CM | POA: Diagnosis not present

## 2022-11-28 DIAGNOSIS — Z7985 Long-term (current) use of injectable non-insulin antidiabetic drugs: Secondary | ICD-10-CM | POA: Diagnosis not present

## 2022-11-28 DIAGNOSIS — I110 Hypertensive heart disease with heart failure: Secondary | ICD-10-CM | POA: Diagnosis not present

## 2022-11-28 DIAGNOSIS — I25118 Atherosclerotic heart disease of native coronary artery with other forms of angina pectoris: Secondary | ICD-10-CM | POA: Diagnosis not present

## 2022-11-28 DIAGNOSIS — I5032 Chronic diastolic (congestive) heart failure: Secondary | ICD-10-CM | POA: Diagnosis not present

## 2022-11-28 DIAGNOSIS — Z794 Long term (current) use of insulin: Secondary | ICD-10-CM | POA: Diagnosis not present

## 2022-11-30 DIAGNOSIS — J441 Chronic obstructive pulmonary disease with (acute) exacerbation: Secondary | ICD-10-CM | POA: Diagnosis not present

## 2022-11-30 DIAGNOSIS — I110 Hypertensive heart disease with heart failure: Secondary | ICD-10-CM | POA: Diagnosis not present

## 2022-11-30 DIAGNOSIS — I25118 Atherosclerotic heart disease of native coronary artery with other forms of angina pectoris: Secondary | ICD-10-CM | POA: Diagnosis not present

## 2022-11-30 DIAGNOSIS — I5032 Chronic diastolic (congestive) heart failure: Secondary | ICD-10-CM | POA: Diagnosis not present

## 2022-11-30 DIAGNOSIS — Z87891 Personal history of nicotine dependence: Secondary | ICD-10-CM | POA: Diagnosis not present

## 2022-11-30 DIAGNOSIS — Z7902 Long term (current) use of antithrombotics/antiplatelets: Secondary | ICD-10-CM | POA: Diagnosis not present

## 2022-11-30 DIAGNOSIS — Z7985 Long-term (current) use of injectable non-insulin antidiabetic drugs: Secondary | ICD-10-CM | POA: Diagnosis not present

## 2022-11-30 DIAGNOSIS — Z9181 History of falling: Secondary | ICD-10-CM | POA: Diagnosis not present

## 2022-11-30 DIAGNOSIS — E785 Hyperlipidemia, unspecified: Secondary | ICD-10-CM | POA: Diagnosis not present

## 2022-11-30 DIAGNOSIS — E1151 Type 2 diabetes mellitus with diabetic peripheral angiopathy without gangrene: Secondary | ICD-10-CM | POA: Diagnosis not present

## 2022-11-30 DIAGNOSIS — Z993 Dependence on wheelchair: Secondary | ICD-10-CM | POA: Diagnosis not present

## 2022-11-30 DIAGNOSIS — M6282 Rhabdomyolysis: Secondary | ICD-10-CM | POA: Diagnosis not present

## 2022-11-30 DIAGNOSIS — Z794 Long term (current) use of insulin: Secondary | ICD-10-CM | POA: Diagnosis not present

## 2022-11-30 DIAGNOSIS — E1169 Type 2 diabetes mellitus with other specified complication: Secondary | ICD-10-CM | POA: Diagnosis not present

## 2022-11-30 DIAGNOSIS — F32A Depression, unspecified: Secondary | ICD-10-CM | POA: Diagnosis not present

## 2022-11-30 DIAGNOSIS — Z91148 Patient's other noncompliance with medication regimen for other reason: Secondary | ICD-10-CM | POA: Diagnosis not present

## 2022-11-30 DIAGNOSIS — M6281 Muscle weakness (generalized): Secondary | ICD-10-CM | POA: Diagnosis not present

## 2022-11-30 DIAGNOSIS — Z89512 Acquired absence of left leg below knee: Secondary | ICD-10-CM | POA: Diagnosis not present

## 2022-12-03 NOTE — Telephone Encounter (Signed)
Called and spoke with patient, he states he spoke with Pharmacist and she refilled his Trulicity on 11/19/22.  He is not currently using the nystatin ointment, okay to decline.

## 2022-12-10 ENCOUNTER — Ambulatory Visit: Payer: 59 | Admitting: Family Medicine

## 2022-12-11 ENCOUNTER — Ambulatory Visit: Payer: 59 | Admitting: Family Medicine

## 2022-12-13 ENCOUNTER — Telehealth: Payer: Self-pay | Admitting: Family Medicine

## 2022-12-13 ENCOUNTER — Ambulatory Visit: Payer: 59 | Admitting: Family Medicine

## 2022-12-13 ENCOUNTER — Telehealth: Payer: Self-pay

## 2022-12-13 ENCOUNTER — Emergency Department
Admission: EM | Admit: 2022-12-13 | Discharge: 2022-12-13 | Disposition: A | Discharge status: Home / Self Care | Payer: 59 | Attending: Emergency Medicine | Admitting: Emergency Medicine

## 2022-12-13 ENCOUNTER — Encounter: Payer: Self-pay | Admitting: Emergency Medicine

## 2022-12-13 ENCOUNTER — Emergency Department: Payer: 59

## 2022-12-13 ENCOUNTER — Other Ambulatory Visit: Payer: Self-pay

## 2022-12-13 DIAGNOSIS — Z7902 Long term (current) use of antithrombotics/antiplatelets: Secondary | ICD-10-CM | POA: Diagnosis not present

## 2022-12-13 DIAGNOSIS — I10 Essential (primary) hypertension: Secondary | ICD-10-CM | POA: Insufficient documentation

## 2022-12-13 DIAGNOSIS — E785 Hyperlipidemia, unspecified: Secondary | ICD-10-CM | POA: Diagnosis not present

## 2022-12-13 DIAGNOSIS — I25118 Atherosclerotic heart disease of native coronary artery with other forms of angina pectoris: Secondary | ICD-10-CM | POA: Diagnosis not present

## 2022-12-13 DIAGNOSIS — E119 Type 2 diabetes mellitus without complications: Secondary | ICD-10-CM | POA: Insufficient documentation

## 2022-12-13 DIAGNOSIS — E1151 Type 2 diabetes mellitus with diabetic peripheral angiopathy without gangrene: Secondary | ICD-10-CM | POA: Diagnosis not present

## 2022-12-13 DIAGNOSIS — S20212A Contusion of left front wall of thorax, initial encounter: Secondary | ICD-10-CM | POA: Insufficient documentation

## 2022-12-13 DIAGNOSIS — R0789 Other chest pain: Secondary | ICD-10-CM

## 2022-12-13 DIAGNOSIS — J449 Chronic obstructive pulmonary disease, unspecified: Secondary | ICD-10-CM | POA: Diagnosis not present

## 2022-12-13 DIAGNOSIS — I491 Atrial premature depolarization: Secondary | ICD-10-CM | POA: Diagnosis not present

## 2022-12-13 DIAGNOSIS — Z9181 History of falling: Secondary | ICD-10-CM | POA: Diagnosis not present

## 2022-12-13 DIAGNOSIS — W06XXXA Fall from bed, initial encounter: Secondary | ICD-10-CM | POA: Insufficient documentation

## 2022-12-13 DIAGNOSIS — I5032 Chronic diastolic (congestive) heart failure: Secondary | ICD-10-CM | POA: Diagnosis not present

## 2022-12-13 DIAGNOSIS — J441 Chronic obstructive pulmonary disease with (acute) exacerbation: Secondary | ICD-10-CM | POA: Diagnosis not present

## 2022-12-13 DIAGNOSIS — Z993 Dependence on wheelchair: Secondary | ICD-10-CM | POA: Diagnosis not present

## 2022-12-13 DIAGNOSIS — Z87891 Personal history of nicotine dependence: Secondary | ICD-10-CM | POA: Diagnosis not present

## 2022-12-13 DIAGNOSIS — Y92003 Bedroom of unspecified non-institutional (private) residence as the place of occurrence of the external cause: Secondary | ICD-10-CM | POA: Diagnosis not present

## 2022-12-13 DIAGNOSIS — R0781 Pleurodynia: Secondary | ICD-10-CM | POA: Diagnosis not present

## 2022-12-13 DIAGNOSIS — R069 Unspecified abnormalities of breathing: Secondary | ICD-10-CM | POA: Diagnosis not present

## 2022-12-13 DIAGNOSIS — Z89512 Acquired absence of left leg below knee: Secondary | ICD-10-CM | POA: Diagnosis not present

## 2022-12-13 DIAGNOSIS — E1169 Type 2 diabetes mellitus with other specified complication: Secondary | ICD-10-CM

## 2022-12-13 DIAGNOSIS — I2510 Atherosclerotic heart disease of native coronary artery without angina pectoris: Secondary | ICD-10-CM | POA: Diagnosis not present

## 2022-12-13 DIAGNOSIS — I110 Hypertensive heart disease with heart failure: Secondary | ICD-10-CM | POA: Diagnosis not present

## 2022-12-13 DIAGNOSIS — Z9981 Dependence on supplemental oxygen: Secondary | ICD-10-CM | POA: Diagnosis not present

## 2022-12-13 DIAGNOSIS — Z91148 Patient's other noncompliance with medication regimen for other reason: Secondary | ICD-10-CM | POA: Diagnosis not present

## 2022-12-13 DIAGNOSIS — F32A Depression, unspecified: Secondary | ICD-10-CM | POA: Diagnosis not present

## 2022-12-13 DIAGNOSIS — R062 Wheezing: Secondary | ICD-10-CM | POA: Diagnosis not present

## 2022-12-13 DIAGNOSIS — Z7985 Long-term (current) use of injectable non-insulin antidiabetic drugs: Secondary | ICD-10-CM | POA: Diagnosis not present

## 2022-12-13 DIAGNOSIS — R0689 Other abnormalities of breathing: Secondary | ICD-10-CM | POA: Diagnosis not present

## 2022-12-13 DIAGNOSIS — Y92009 Unspecified place in unspecified non-institutional (private) residence as the place of occurrence of the external cause: Secondary | ICD-10-CM

## 2022-12-13 DIAGNOSIS — S299XXA Unspecified injury of thorax, initial encounter: Secondary | ICD-10-CM | POA: Diagnosis present

## 2022-12-13 MED ORDER — FREESTYLE LIBRE 2 SENSOR MISC: 3 refills | Status: DC

## 2022-12-13 NOTE — ED Provider Notes (Signed)
[facility] Emergency Department Provider Note     Event Date/Time   First MD Initiated Contact with Patient 12/13/22 1633     (approximate)   History   Rib Injury   HPI  Danny BEHNEN Sr. is a 72 y.o. male history of type 2 diabetes, HTN, CAD, HLD, COPD, and morbid obesity as well as a right B KA, presents to the ED via EMS from home.  Patient reports a mechanical fall out of his bed a few nights prior, injuring his left chest and ribs.  He denies any head injury or LOC.  He was advised by his home health nurse to report to the ED for evaluation.  Vital signs reported as stable via EMS.  EMS administered 1 DuoNeb and route to the ED.  Patient denies any other complaints at this time.  Physical Exam   Triage Vital Signs: ED Triage Vitals  Enc Vitals Group     BP 12/13/22 1422 117/70     Pulse Rate 12/13/22 1420 82     Resp 12/13/22 1420 18     Temp 12/13/22 1422 97.7 F (36.5 C)     Temp Source 12/13/22 1422 Oral     SpO2 12/13/22 1420 97 %     Weight 12/13/22 1421 235 lb 14.3 oz (107 kg)     Height --      Head Circumference --      Peak Flow --      Pain Score 12/13/22 1420 0     Pain Loc --      Pain Edu? --      Excl. in GC? --     Most recent vital signs: Vitals:   12/13/22 1420 12/13/22 1422  BP:  117/70  Pulse: 82   Resp: 18   Temp:  97.7 F (36.5 C)  SpO2: 97%     General Awake, no distress. NAD HEENT NCAT. PERRL. EOMI. No rhinorrhea. Mucous membranes are moist.  CV:  Good peripheral perfusion.  RESP:  Normal effort.  Left lateral rib with some mild bruising noted.  No chest wall deformity appreciated.  No adventitious lung sounds appreciated. ABD:  No distention.  MSK:  Normal ROM of all extremities.   ED Results / Procedures / Treatments   Labs (all labs ordered are listed, but only abnormal results are displayed) Labs Reviewed - No data to display   EKG   RADIOLOGY  I personally viewed and evaluated these  images as part of my medical decision making, as well as reviewing the written report by the radiologist.  ED Provider Interpretation: No acute findings  DG Ribs Unilateral W/Chest Left  Result Date: 12/13/2022 CLINICAL DATA:  Left rib pain after fall. EXAM: LEFT RIBS AND CHEST - 3+ VIEW COMPARISON:  None Available. FINDINGS: No fracture or other bone lesions are seen involving the ribs. There is no evidence of pneumothorax or pleural effusion. Both lungs are clear. Heart size and mediastinal contours are within normal limits. IMPRESSION: Negative. Electronically Signed   By: Lupita Raider M.D.   On: 12/13/2022 15:16     PROCEDURES:  Critical Care performed: No  Procedures   MEDICATIONS ORDERED IN ED: Medications - No data to display   IMPRESSION / MDM / ASSESSMENT AND PLAN / ED COURSE  I reviewed the triage vital signs and the nursing notes.  Differential diagnosis includes, but is not limited to, chest contusion, rib fracture, pneumothorax, COPD exacerbation, MSK pain  Patient's presentation is most consistent with acute complicated illness / injury requiring diagnostic workup.  Patient's diagnosis is consistent with mechanical fall at home resulting in a chest wall contusion.  No radiologic evidence of any acute fracture or intrathoracic process based on interpretation of images.  Patient will be discharged home with directions to take OTC Tylenol as needed. Patient is to follow up with his primary provider as needed or otherwise directed. Patient is given ED precautions to return to the ED for any worsening or new symptoms.   FINAL CLINICAL IMPRESSION(S) / ED DIAGNOSES   Final diagnoses:  Fall at home, initial encounter  Left-sided chest wall pain     Rx / DC Orders   ED Discharge Orders     None        Note:  This document was prepared using Dragon voice recognition software and may include unintentional dictation errors.     Lissa Hoard, PA-C 12/13/22 1706    Sharman Cheek, MD 12/13/22 2318

## 2022-12-13 NOTE — Telephone Encounter (Signed)
Harriett Sine called from [facility] to state patient fell about three days ago and he has some abrasion on right chin and bruising on left side of his back and when he breathes in it does hurt.  Harriett Sine states patient does have some bruising on his arm but she believes it is from the blood thinners he is on.  Harriett Sine states patient has elevated sugar levels.  Harriett Sine states right now patient's blood sugar is 217 and was 239 earlier today, but was 400 this morning.   Prescription Request   12/13/2022   LOV: Visit date not found   What is the name of the medication or equipment? Continuous Blood Gluc Sensor (FREESTYLE LIBRE 2 SENSOR) MISC   Have you contacted your pharmacy to request a refill? No    Which pharmacy would you like this sent to?    [facility] [address] [place] - [address] [place] [address] Phone: [phone] Fax: [phone]     Patient notified that their request is being sent to the clinical staff for review and that they should receive a response within 2 business days.    Please advise at Mobile [phone] (home)

## 2022-12-13 NOTE — ED Triage Notes (Signed)
First nurse note: Arrived by EMS from home with c/o breathing difficulties that started this AM. Reports little relief with inhaler at home.   EMS administered 1 duoneb  -BKA left leg   EMS vitals: 129/86 b/p 98% RA 77HR 20RR

## 2022-12-13 NOTE — ED Triage Notes (Addendum)
Pt states fell out of bed a few nights ago and is having pain to left ribs. Denies any LOC. Did not hit head. Landed on left side. Pt has home health nurse and advised to come in for evaluation. Pt denies any increased SOB. States hx of COPD so always has some SOB.

## 2022-12-13 NOTE — Telephone Encounter (Signed)
Patient's niece called and wanted Dr [name] to know patient is on his way to the ED, he fell yesterday.

## 2022-12-13 NOTE — Telephone Encounter (Signed)
Noted  

## 2022-12-13 NOTE — Discharge Instructions (Addendum)
Your exam and XR are normal and reassuring. There is no evidence of a rib fracture. Take your home meds as directed. Follow-up with your primary provider as scheduled.

## 2022-12-13 NOTE — Telephone Encounter (Signed)
Nancy called from [facility] to state patient fell about three days ago and he has some abrasion on right chin and bruising on left side of his back and when he breathes in it does hurt.  Nancy states patient does have some bruising on his arm but she believes it is from the blood thinners he is on.  Nancy states patient has elevated sugar levels.  Nancy states right now patient's blood sugar is 217 and was 239 earlier today, but was 400 this morning.   Prescription Request   12/13/2022   LOV: Visit date not found   What is the name of the medication or equipment? Continuous Blood Gluc Sensor (FREESTYLE LIBRE 2 SENSOR) MISC   Have you contacted your pharmacy to request a refill? No    Which pharmacy would you like this sent to?    [id number] [place], [place] - [address] [place] [address] Phone: [phone] Fax: [phone]     Patient notified that their request is being sent to the clinical staff for review and that they should receive a response within 2 business days.    Please advise at Mobile [phone] (home)       

## 2022-12-13 NOTE — Telephone Encounter (Signed)
Called and spoke to Patient's nephew- he states he did not know the Patient had a fall 3 days ago nor did he know the Patient is hurting when he breathes in since the fall. Per Bethanie Dicker, NP the Patient is on blood thinners and had this fall so he needs to be evaluated. The Nephew states he does not have a ride to get the Patient to the hospital so he will call 911 and request they come pick him up.

## 2022-12-13 NOTE — Telephone Encounter (Signed)
Spoke to Trinidad from [facility] she states the Patient's son did say the Patient fell but stated he did not hit his head. Patient is on blood thinners  and is having pain when he breathes in since the fall so Bethanie Dicker, NP recommends the Patient be seen and evaluated for this and his sugar since it was 400 or maybe more this morning.Harriett Sine said the Patient states he took his Humalog when his sugar was 400 per the sliding scale. Patient was supposed to see his PCP on 12/13/22 at 11:00 but was a no show just like last week. Patient is rescheduled for Monday 12/17/22 at 11:00 and Harriett Sine states she told the son that they need to be sure and make this appointment. Harriett Sine states since Bethanie Dicker recommends being evaluated that she is going back and calling the daughter which is the Emergency contact to see if she can take him to be evaluated and if not she will call the Paramedics and wait with the Patient to ensure that he is evaluated.

## 2022-12-14 DIAGNOSIS — I5032 Chronic diastolic (congestive) heart failure: Secondary | ICD-10-CM | POA: Diagnosis not present

## 2022-12-14 DIAGNOSIS — E1151 Type 2 diabetes mellitus with diabetic peripheral angiopathy without gangrene: Secondary | ICD-10-CM | POA: Diagnosis not present

## 2022-12-14 DIAGNOSIS — Z9181 History of falling: Secondary | ICD-10-CM | POA: Diagnosis not present

## 2022-12-14 DIAGNOSIS — E785 Hyperlipidemia, unspecified: Secondary | ICD-10-CM | POA: Diagnosis not present

## 2022-12-14 DIAGNOSIS — Z9981 Dependence on supplemental oxygen: Secondary | ICD-10-CM | POA: Diagnosis not present

## 2022-12-14 DIAGNOSIS — Z89512 Acquired absence of left leg below knee: Secondary | ICD-10-CM | POA: Diagnosis not present

## 2022-12-14 DIAGNOSIS — F32A Depression, unspecified: Secondary | ICD-10-CM | POA: Diagnosis not present

## 2022-12-14 DIAGNOSIS — Z7902 Long term (current) use of antithrombotics/antiplatelets: Secondary | ICD-10-CM | POA: Diagnosis not present

## 2022-12-14 DIAGNOSIS — Z7985 Long-term (current) use of injectable non-insulin antidiabetic drugs: Secondary | ICD-10-CM | POA: Diagnosis not present

## 2022-12-14 DIAGNOSIS — J441 Chronic obstructive pulmonary disease with (acute) exacerbation: Secondary | ICD-10-CM | POA: Diagnosis not present

## 2022-12-14 DIAGNOSIS — I110 Hypertensive heart disease with heart failure: Secondary | ICD-10-CM | POA: Diagnosis not present

## 2022-12-14 DIAGNOSIS — Z91148 Patient's other noncompliance with medication regimen for other reason: Secondary | ICD-10-CM | POA: Diagnosis not present

## 2022-12-14 DIAGNOSIS — Z87891 Personal history of nicotine dependence: Secondary | ICD-10-CM | POA: Diagnosis not present

## 2022-12-14 DIAGNOSIS — I25118 Atherosclerotic heart disease of native coronary artery with other forms of angina pectoris: Secondary | ICD-10-CM | POA: Diagnosis not present

## 2022-12-14 DIAGNOSIS — J449 Chronic obstructive pulmonary disease, unspecified: Secondary | ICD-10-CM | POA: Diagnosis not present

## 2022-12-14 DIAGNOSIS — Z993 Dependence on wheelchair: Secondary | ICD-10-CM | POA: Diagnosis not present

## 2022-12-14 NOTE — Telephone Encounter (Signed)
error 

## 2022-12-17 ENCOUNTER — Telehealth: Payer: Self-pay

## 2022-12-17 ENCOUNTER — Inpatient Hospital Stay: Payer: 59 | Admitting: Family Medicine

## 2022-12-17 NOTE — Telephone Encounter (Signed)
Patient fell of of schedule and I know he was sent to the ED last week for a fall and pain when breathing in so I called and scheduled him for a ED follow up on 01/01/23 at 11:00

## 2022-12-18 ENCOUNTER — Other Ambulatory Visit: Payer: Self-pay | Admitting: Family Medicine

## 2022-12-18 DIAGNOSIS — B372 Candidiasis of skin and nail: Secondary | ICD-10-CM

## 2022-12-19 NOTE — Telephone Encounter (Signed)
Medication was discontinued on 12/10/2022. Is it okay to refuse?

## 2022-12-20 ENCOUNTER — Emergency Department: Payer: 59

## 2022-12-20 ENCOUNTER — Other Ambulatory Visit: Payer: Self-pay

## 2022-12-20 ENCOUNTER — Other Ambulatory Visit: Payer: Self-pay | Admitting: Internal Medicine

## 2022-12-20 ENCOUNTER — Encounter: Payer: Self-pay | Admitting: Emergency Medicine

## 2022-12-20 ENCOUNTER — Telehealth: Payer: Self-pay | Admitting: Family Medicine

## 2022-12-20 ENCOUNTER — Inpatient Hospital Stay
Admission: EM | Admit: 2022-12-20 | Discharge: 2022-12-23 | DRG: 193 | Disposition: A | Discharge status: Home Health | Payer: 59 | Attending: Internal Medicine | Admitting: Internal Medicine

## 2022-12-20 DIAGNOSIS — I255 Ischemic cardiomyopathy: Secondary | ICD-10-CM | POA: Diagnosis not present

## 2022-12-20 DIAGNOSIS — J9621 Acute and chronic respiratory failure with hypoxia: Secondary | ICD-10-CM | POA: Diagnosis present

## 2022-12-20 DIAGNOSIS — I10 Essential (primary) hypertension: Secondary | ICD-10-CM | POA: Diagnosis not present

## 2022-12-20 DIAGNOSIS — R0689 Other abnormalities of breathing: Secondary | ICD-10-CM | POA: Diagnosis not present

## 2022-12-20 DIAGNOSIS — J441 Chronic obstructive pulmonary disease with (acute) exacerbation: Secondary | ICD-10-CM | POA: Diagnosis present

## 2022-12-20 DIAGNOSIS — Z794 Long term (current) use of insulin: Secondary | ICD-10-CM

## 2022-12-20 DIAGNOSIS — Z888 Allergy status to other drugs, medicaments and biological substances status: Secondary | ICD-10-CM | POA: Diagnosis not present

## 2022-12-20 DIAGNOSIS — Z7985 Long-term (current) use of injectable non-insulin antidiabetic drugs: Secondary | ICD-10-CM | POA: Diagnosis not present

## 2022-12-20 DIAGNOSIS — I2510 Atherosclerotic heart disease of native coronary artery without angina pectoris: Secondary | ICD-10-CM | POA: Diagnosis present

## 2022-12-20 DIAGNOSIS — F32A Depression, unspecified: Secondary | ICD-10-CM | POA: Diagnosis not present

## 2022-12-20 DIAGNOSIS — Z955 Presence of coronary angioplasty implant and graft: Secondary | ICD-10-CM

## 2022-12-20 DIAGNOSIS — I110 Hypertensive heart disease with heart failure: Secondary | ICD-10-CM | POA: Diagnosis not present

## 2022-12-20 DIAGNOSIS — J439 Emphysema, unspecified: Secondary | ICD-10-CM | POA: Diagnosis present

## 2022-12-20 DIAGNOSIS — D751 Secondary polycythemia: Secondary | ICD-10-CM | POA: Diagnosis present

## 2022-12-20 DIAGNOSIS — E785 Hyperlipidemia, unspecified: Secondary | ICD-10-CM | POA: Diagnosis present

## 2022-12-20 DIAGNOSIS — Z7982 Long term (current) use of aspirin: Secondary | ICD-10-CM | POA: Diagnosis not present

## 2022-12-20 DIAGNOSIS — E1165 Type 2 diabetes mellitus with hyperglycemia: Secondary | ICD-10-CM | POA: Diagnosis not present

## 2022-12-20 DIAGNOSIS — Z993 Dependence on wheelchair: Secondary | ICD-10-CM | POA: Diagnosis not present

## 2022-12-20 DIAGNOSIS — I5042 Chronic combined systolic (congestive) and diastolic (congestive) heart failure: Secondary | ICD-10-CM | POA: Diagnosis present

## 2022-12-20 DIAGNOSIS — Z7902 Long term (current) use of antithrombotics/antiplatelets: Secondary | ICD-10-CM

## 2022-12-20 DIAGNOSIS — R197 Diarrhea, unspecified: Secondary | ICD-10-CM | POA: Diagnosis not present

## 2022-12-20 DIAGNOSIS — R0902 Hypoxemia: Secondary | ICD-10-CM | POA: Insufficient documentation

## 2022-12-20 DIAGNOSIS — Z9981 Dependence on supplemental oxygen: Secondary | ICD-10-CM | POA: Diagnosis not present

## 2022-12-20 DIAGNOSIS — Z91148 Patient's other noncompliance with medication regimen for other reason: Secondary | ICD-10-CM | POA: Diagnosis not present

## 2022-12-20 DIAGNOSIS — R109 Unspecified abdominal pain: Secondary | ICD-10-CM | POA: Diagnosis not present

## 2022-12-20 DIAGNOSIS — Z1152 Encounter for screening for COVID-19: Secondary | ICD-10-CM

## 2022-12-20 DIAGNOSIS — I252 Old myocardial infarction: Secondary | ICD-10-CM | POA: Diagnosis not present

## 2022-12-20 DIAGNOSIS — Z79899 Other long term (current) drug therapy: Secondary | ICD-10-CM

## 2022-12-20 DIAGNOSIS — Z743 Need for continuous supervision: Secondary | ICD-10-CM | POA: Diagnosis not present

## 2022-12-20 DIAGNOSIS — Z89512 Acquired absence of left leg below knee: Secondary | ICD-10-CM | POA: Diagnosis not present

## 2022-12-20 DIAGNOSIS — E1151 Type 2 diabetes mellitus with diabetic peripheral angiopathy without gangrene: Secondary | ICD-10-CM | POA: Diagnosis not present

## 2022-12-20 DIAGNOSIS — J189 Pneumonia, unspecified organism: Principal | ICD-10-CM

## 2022-12-20 DIAGNOSIS — J449 Chronic obstructive pulmonary disease, unspecified: Secondary | ICD-10-CM | POA: Diagnosis not present

## 2022-12-20 DIAGNOSIS — J440 Chronic obstructive pulmonary disease with acute lower respiratory infection: Secondary | ICD-10-CM | POA: Diagnosis present

## 2022-12-20 DIAGNOSIS — Z87891 Personal history of nicotine dependence: Secondary | ICD-10-CM | POA: Diagnosis not present

## 2022-12-20 DIAGNOSIS — G4489 Other headache syndrome: Secondary | ICD-10-CM | POA: Diagnosis not present

## 2022-12-20 DIAGNOSIS — R0602 Shortness of breath: Secondary | ICD-10-CM | POA: Diagnosis not present

## 2022-12-20 DIAGNOSIS — R739 Hyperglycemia, unspecified: Secondary | ICD-10-CM | POA: Diagnosis not present

## 2022-12-20 DIAGNOSIS — E1122 Type 2 diabetes mellitus with diabetic chronic kidney disease: Secondary | ICD-10-CM | POA: Diagnosis present

## 2022-12-20 DIAGNOSIS — I25118 Atherosclerotic heart disease of native coronary artery with other forms of angina pectoris: Secondary | ICD-10-CM | POA: Diagnosis not present

## 2022-12-20 DIAGNOSIS — K429 Umbilical hernia without obstruction or gangrene: Secondary | ICD-10-CM | POA: Diagnosis not present

## 2022-12-20 DIAGNOSIS — Z8249 Family history of ischemic heart disease and other diseases of the circulatory system: Secondary | ICD-10-CM | POA: Diagnosis not present

## 2022-12-20 DIAGNOSIS — Z9181 History of falling: Secondary | ICD-10-CM | POA: Diagnosis not present

## 2022-12-20 DIAGNOSIS — I5032 Chronic diastolic (congestive) heart failure: Secondary | ICD-10-CM | POA: Diagnosis not present

## 2022-12-20 DIAGNOSIS — R531 Weakness: Secondary | ICD-10-CM | POA: Diagnosis not present

## 2022-12-20 LAB — CBC
HCT: 54.9 % — ABNORMAL HIGH (ref 39.0–52.0)
Hemoglobin: 17.2 g/dL — ABNORMAL HIGH (ref 13.0–17.0)
MCH: 31.3 pg (ref 26.0–34.0)
MCHC: 31.3 g/dL (ref 30.0–36.0)
MCV: 100 fL (ref 80.0–100.0)
Platelets: 150 10*3/uL (ref 150–400)
RBC: 5.49 MIL/uL (ref 4.22–5.81)
RDW: 13.1 % (ref 11.5–15.5)
WBC: 12.8 10*3/uL — ABNORMAL HIGH (ref 4.0–10.5)
nRBC: 0 % (ref 0.0–0.2)

## 2022-12-20 LAB — BASIC METABOLIC PANEL
Anion gap: 9 (ref 5–15)
BUN: 27 mg/dL — ABNORMAL HIGH (ref 8–23)
CO2: 24 mmol/L (ref 22–32)
Calcium: 9.1 mg/dL (ref 8.9–10.3)
Chloride: 101 mmol/L (ref 98–111)
Creatinine, Ser: 1.16 mg/dL (ref 0.61–1.24)
GFR, Estimated: >60 mL/min (ref >60)
Glucose, Bld: 239 mg/dL — ABNORMAL HIGH (ref 70–99)
Potassium: 4 mmol/L (ref 3.5–5.1)
Sodium: 134 mmol/L — ABNORMAL LOW (ref 135–145)

## 2022-12-20 LAB — GLUCOSE, CAPILLARY: Glucose-Capillary: 280 mg/dL — ABNORMAL HIGH (ref 70–99)

## 2022-12-20 LAB — SARS CORONAVIRUS 2 BY RT PCR: SARS Coronavirus 2 by RT PCR: NEGATIVE

## 2022-12-20 MED ORDER — SODIUM CHLORIDE 0.9% FLUSH
3.0000 mL | Freq: Two times a day (BID) | INTRAVENOUS | Status: DC
  Administered 2022-12-21 – 2022-12-22 (×4): 3 mL via INTRAVENOUS

## 2022-12-20 MED ORDER — SODIUM CHLORIDE 0.9 % IV SOLN
1.0000 g | Freq: Once | INTRAVENOUS | Status: AC
  Administered 2022-12-20: 1 g via INTRAVENOUS
  Filled 2022-12-20: qty 10

## 2022-12-20 MED ORDER — IOHEXOL 300 MG/ML  SOLN
100.0000 mL | Freq: Once | INTRAMUSCULAR | Status: AC | PRN
  Administered 2022-12-20: 100 mL via INTRAVENOUS

## 2022-12-20 MED ORDER — ACETAMINOPHEN 650 MG RE SUPP: 650.0000 mg | Freq: Four times a day (QID) | RECTAL | Status: DC | PRN

## 2022-12-20 MED ORDER — SODIUM CHLORIDE 0.9 % IV SOLN
500.0000 mg | Freq: Once | INTRAVENOUS | Status: AC
  Administered 2022-12-20: 500 mg via INTRAVENOUS
  Filled 2022-12-20: qty 5

## 2022-12-20 MED ORDER — LIDOCAINE 5 % EX PTCH
1.0000 | MEDICATED_PATCH | CUTANEOUS | Status: DC
  Administered 2022-12-20 – 2022-12-21 (×2): 1 via TRANSDERMAL
  Filled 2022-12-20 (×2): qty 1

## 2022-12-20 MED ORDER — INSULIN ASPART 100 UNIT/ML IJ SOLN
0.0000 [IU] | Freq: Three times a day (TID) | INTRAMUSCULAR | Status: DC
  Administered 2022-12-21: 3 [IU] via SUBCUTANEOUS
  Administered 2022-12-21: 5 [IU] via SUBCUTANEOUS
  Administered 2022-12-21: 3 [IU] via SUBCUTANEOUS
  Administered 2022-12-22: 5 [IU] via SUBCUTANEOUS
  Administered 2022-12-22: 3 [IU] via SUBCUTANEOUS
  Administered 2022-12-22: 8 [IU] via SUBCUTANEOUS
  Administered 2022-12-23: 3 [IU] via SUBCUTANEOUS
  Filled 2022-12-20 (×7): qty 1

## 2022-12-20 MED ORDER — ACETAMINOPHEN 325 MG PO TABS
650.0000 mg | ORAL_TABLET | Freq: Four times a day (QID) | ORAL | Status: DC | PRN
  Administered 2022-12-21 – 2022-12-22 (×4): 650 mg via ORAL
  Filled 2022-12-20 (×4): qty 2

## 2022-12-20 MED ORDER — SODIUM CHLORIDE 0.9 % IV SOLN
3.0000 g | Freq: Four times a day (QID) | INTRAVENOUS | Status: DC
  Administered 2022-12-21 – 2022-12-22 (×5): 3 g via INTRAVENOUS
  Filled 2022-12-20 (×6): qty 8

## 2022-12-20 MED ORDER — ENOXAPARIN SODIUM 40 MG/0.4ML IJ SOSY
40.0000 mg | PREFILLED_SYRINGE | INTRAMUSCULAR | Status: DC
  Administered 2022-12-21 – 2022-12-23 (×3): 40 mg via SUBCUTANEOUS
  Filled 2022-12-20 (×3): qty 0.4

## 2022-12-20 MED ORDER — INSULIN ASPART 100 UNIT/ML IJ SOLN
0.0000 [IU] | Freq: Every day | INTRAMUSCULAR | Status: DC
  Administered 2022-12-20: 3 [IU] via SUBCUTANEOUS
  Administered 2022-12-21: 5 [IU] via SUBCUTANEOUS
  Administered 2022-12-22: 4 [IU] via SUBCUTANEOUS
  Filled 2022-12-20 (×3): qty 1

## 2022-12-20 MED ORDER — SODIUM CHLORIDE 0.9 % IV SOLN
500.0000 mg | INTRAVENOUS | Status: DC
  Filled 2022-12-20: qty 5

## 2022-12-20 NOTE — Telephone Encounter (Signed)
Called Patient and the nephew answered and said that the family called the Paramedics and the Patient is gone to the ED for oxygen level dropping, vomiting, diarrhea and feeling unwell. Called Patient's home phone and got no answer or voicemail.

## 2022-12-20 NOTE — ED Notes (Signed)
Pt provided box meal

## 2022-12-20 NOTE — Assessment & Plan Note (Signed)
Follow-up blood cultures as ordered.  We will also check COVID-19 and influenza.  We will treat the patient with Unasyn.  Given atypical presentation I will also check swallow screening.

## 2022-12-20 NOTE — H&P (Addendum)
History and Physical    Patient: Danny [name]:[id number] DOB: 1951/05/04 DOA: 12/20/2022 DOS: the patient was seen and examined on 12/20/2022 PCP: Danny Luis, MD  Patient coming from: Home  Chief Complaint:  Chief Complaint  Patient presents with   Shortness of Breath   HPI: Danny KOBAYASHI Sr. is a 72 y.o. male with medical history significant of COPD.  However patient does not use supplementary oxygen at home and typically is able to ambulate within the house without any shortness of breath.  Patient reports a rather insidious onset of shortness of breath about 2 weeks ago and he would feel short of breath while walking on level ground within the house.  Patient was actually evaluated at [facility] ER about a week ago for this complaint as well.  Patient does not describe any orthopnea.  Patient does not report any progressive nature of the shortness of breath.  However he has had increasing fatigue in the last couple of days.  There is no report of patient having any fever or rigors or chest pain or coughing or leg swelling.  Patient reports having a single episode of vomiting about 5 days ago without any bloody emesis.  No recurrence since then.  No abdominal pain or diarrhea is reported.  Patient was evaluated by home health nurse today and found to have 89% FiO2 SpO2 on room air.  Patient is sent to ER for further evaluation.  ER course is notable for there being some abdominal tenderness in the lower aspect as per ER attending s/p CAT scan of the abdomen with finding of right lower lobe pneumonia.  Medical evaluation is sought  Patient at this time offers complaints of ongoing fatigue otherwise feeling okay and does not report any complaints. Review of Systems: As mentioned in the history of present illness. All other systems reviewed and are negative. Past Medical History:  Diagnosis Date   CAD (coronary artery disease)    a. 01/2010 PCI of LAD; b. 09/2014 PCI/DES of mLAD due  to ISR. D1 80, D2 80(jailed), LCX 67m; c. 07/2016 NSTEMI/Cath: LM nl, LAD 88m ISR, 40d, D1 90ost, 80p, D2 90ost, RI min irregs, LCX min irregs, OM1 90 small, OM2/3 min irregs, RCA min irregs, RPLB1 90, EF 25-35%.   Chronic combined systolic and diastolic CHF (congestive heart failure) (HCC)    a. 07/2016 Echo: EF 30%, severe septal/anterior HK, Gr1 DD.   CKD (chronic kidney disease), stage III (HCC)    COPD (chronic obstructive pulmonary disease) (HCC)    DM2 (diabetes mellitus, type 2) (HCC)    Erectile dysfunction    HLD (hyperlipidemia)    HTN (hypertension)    Ischemic cardiomyopathy    a. 07/2016 Echo: EF 30% w/ sev septal/ant HK. Gr1 DD.   Past Surgical History:  Procedure Laterality Date   AMPUTATION Left 11/10/2020   Procedure: AMPUTATION BELOW KNEE;  Surgeon: Danny Needy, MD;  Location: [facility] ORS;  Service: General;  Laterality: Left;   BELOW KNEE LEG AMPUTATION     CAD: stent to the LAD     CARDIAC CATHETERIZATION  10/01/2014   CARDIAC CATHETERIZATION N/A 07/23/2016   Procedure: Left Heart Cath and Coronary Angiography;  Surgeon: Danny Ouch, MD;  Location: [facility] INVASIVE CV LAB;  Service: Cardiovascular;  Laterality: N/A;   CORONARY ANGIOPLASTY WITH STENT PLACEMENT  10/01/2014   LOWER EXTREMITY ANGIOGRAPHY Left 10/31/2020   Procedure: LOWER EXTREMITY ANGIOGRAPHY;  Surgeon: Danny Needy, MD;  Location:  [facility] INVASIVE CV LAB;  Service: Cardiovascular;  Laterality: Left;   LOWER EXTREMITY ANGIOGRAPHY Right 11/20/2021   Procedure: Lower Extremity Angiography;  Surgeon: Danny Needy, MD;  Location: [facility] INVASIVE CV LAB;  Service: Cardiovascular;  Laterality: Right;   Social History:  reports that he quit smoking about 13 years ago. His smoking use included cigarettes. He smoked an average of 3.00 packs per day. He has never used smokeless tobacco. He reports that he does not currently use alcohol after a past usage of about 7.0 standard drinks of alcohol per week. He reports that he  does not use drugs.  Allergies  Allergen Reactions   Metformin And Related Diarrhea    If he takes it twice a day it causes diarhea, so he reduced to once daily    Family History  Problem Relation Age of Onset   Heart attack Father        complications   Cancer Brother     Prior to Admission medications   Medication Sig Start Date End Date Taking? Authorizing Provider  acetaminophen (TYLENOL) 325 MG tablet Take 2 tablets (650 mg total) by mouth every 4 (four) hours as needed for mild pain (or temp > 37.5 C (99.5 F)). 10/10/21   Danny Carrow, MD  albuterol (PROVENTIL) (2.5 MG/3ML) 0.083% nebulizer solution Take 3 mLs (2.5 mg total) by nebulization every 6 (six) hours as needed for wheezing or shortness of breath. Patient not taking: Reported on 11/19/2022 09/30/20   Danny Luis, MD  Albuterol Sulfate (PROAIR RESPICLICK) 108 (90 Base) MCG/ACT AEPB Inhale 1 puff into the lungs every 6 (six) hours as needed. 11/19/22   Danny Luis, MD  amLODipine (NORVASC) 10 MG tablet Take 1 tablet (10 mg total) by mouth daily. 11/19/22   Danny Luis, MD  aspirin EC 81 MG tablet Take 1 tablet (81 mg total) by mouth daily. 11/20/21   Danny Needy, MD  atorvastatin (LIPITOR) 80 MG tablet Take 1 tablet (80 mg total) by mouth daily. 11/19/22   Danny Luis, MD  butalbital-acetaminophen-caffeine (FIORICET) [phone] MG tablet Take 1 tablet by mouth every 8 (eight) hours as needed for headache. Patient not taking: Reported on 11/19/2022 10/10/21   Danny Carrow, MD  carvedilol (COREG) 25 MG tablet Take 1 tablet (25 mg total) by mouth 2 (two) times daily with a meal. 11/19/22   Danny Luis, MD  clopidogrel (PLAVIX) 75 MG tablet Take 1 tablet (75 mg total) by mouth daily. 11/19/22   Danny Luis, MD  Continuous Blood Gluc Receiver (FREESTYLE LIBRE 2 READER) DEVI 1 Device by Does not apply route 4 (four) times daily. 04/27/22   Danny Luis, MD  Continuous Glucose Sensor (FREESTYLE  LIBRE 2 SENSOR) MISC Check blood sugars 12/13/22   Danny Luis, MD  Dulaglutide (TRULICITY) 4.5 MG/0.5ML SOPN Inject 4.5 mg into the skin once a week. 11/19/22   Danny Luis, MD  empagliflozin (JARDIANCE) 25 MG TABS tablet Take 1 tablet (25 mg total) by mouth daily. 11/19/22   Danny Luis, MD  escitalopram (LEXAPRO) 10 MG tablet Take 1 tablet (10 mg total) by mouth daily. 11/19/22   Danny Luis, MD  fluticasone-salmeterol (ADVAIR) 250-50 MCG/ACT AEPB Inhale 1 puff into the lungs 2 (two) times daily. Patient not taking: Reported on 11/19/2022 07/24/21   Danny Luis, MD  insulin lispro (HUMALOG KWIKPEN) 100 UNIT/ML KwikPen Inject 0-15 Units into the skin 4 (four) times daily -  before meals and at bedtime. CBG < 70: Implement Hypoglycemia Standing Orders and refer to Hypoglycemia Standing Orders sidebar report CBG 70 - 120: 0 units CBG 121 - 150: 2 units CBG 151 - 200: 3 units CBG 201 - 250: 5 units CBG 251 - 300: 8 units CBG 301 - 350: 11 units CBG 351 - 400: 15 units CBG > 400: call MD 05/24/22   Danny Luis, MD  Insulin Pen Needle (BD PEN NEEDLE MICRO U/F) 32G X 6 MM MISC 1 EACH BY DOES NOT APPLY ROUTE 2 (TWO) TIMES DAILY. Patient not taking: Reported on 11/19/2022 05/28/22   Danny Luis, MD  nitroGLYCERIN (NITROSTAT) 0.4 MG SL tablet Place 0.4 mg under the tongue every 5 (five) minutes as needed for chest pain.    [provider]  spironolactone (ALDACTONE) 25 MG tablet TAKE 1 TABLET (25 MG TOTAL) BY MOUTH DAILY. 07/30/22   Worthy Rancher B, FNP  tamsulosin (FLOMAX) 0.4 MG CAPS capsule TAKE 1 CAPSULE BY MOUTH EVERY DAY 07/30/22   Worthy Rancher B, FNP    Physical Exam: Vitals:   12/20/22 1537 12/20/22 2113 12/20/22 2117  BP: 103/68  (!) 165/92  Pulse: 91  80  Resp: 18  18  Temp: 98.6 F (37 C)  97.9 F (36.6 C)  TempSrc: Oral  Oral  SpO2: 91%  96%  Weight:  96.9 kg   Height:  6' (1.829 m)    General: Patient does not appear to be in any  distress.  Patient is being maintained on 2 L/min of supplementary oxygen for comfort.  Hypoxia was not corroborated in the ER. Patient seems to have a faint voice.  Patient ascribes this to being tired and reports that his voice is actually at his baseline. Respiratory exam right basilar crackles are heard laterally and posteriorly.  Otherwise air entry is vesicular without any expiratory wheezes Cardiovascular exam S1-S2 normal Abdomen soft nontender Extremities warm without edema.  Left BKA is noted Data Reviewed:  Labs on Admission:  Results for orders placed or performed during the hospital encounter of 12/20/22 (from the past 24 hour(s))  CBC     Status: Abnormal   Collection Time: 12/20/22  3:39 PM  Result Value Ref Range   WBC 12.8 (H) 4.0 - 10.5 K/uL   RBC 5.49 4.22 - 5.81 MIL/uL   Hemoglobin 17.2 (H) 13.0 - 17.0 g/dL   HCT 16.1 (H) 09.6 - 04.5 %   MCV 100.0 80.0 - 100.0 fL   MCH 31.3 26.0 - 34.0 pg   MCHC 31.3 30.0 - 36.0 g/dL   RDW 40.9 81.1 - 91.4 %   Platelets 150 150 - 400 K/uL   nRBC 0.0 0.0 - 0.2 %  Basic metabolic panel     Status: Abnormal   Collection Time: 12/20/22  7:32 PM  Result Value Ref Range   Sodium 134 (L) 135 - 145 mmol/L   Potassium 4.0 3.5 - 5.1 mmol/L   Chloride 101 98 - 111 mmol/L   CO2 24 22 - 32 mmol/L   Glucose, Bld 239 (H) 70 - 99 mg/dL   BUN 27 (H) 8 - 23 mg/dL   Creatinine, Ser 7.82 0.61 - 1.24 mg/dL   Calcium 9.1 8.9 - 95.6 mg/dL   GFR, Estimated >21 >30 mL/min   Anion gap 9 5 - 15   Basic Metabolic Panel: Recent Labs  Lab 12/20/22 1932  NA 134*  K 4.0  CL 101  CO2 24  GLUCOSE 239*  BUN 27*  CREATININE 1.16  CALCIUM 9.1   Liver Function Tests: No results for input(s): "AST", "ALT", "ALKPHOS", "BILITOT", "PROT", "ALBUMIN" in the last 168 hours. No results for input(s): "LIPASE", "AMYLASE" in the last 168 hours. No results for input(s): "AMMONIA" in the last 168 hours. CBC: Recent Labs  Lab 12/20/22 1539  WBC 12.8*  HGB  17.2*  HCT 54.9*  MCV 100.0  PLT 150   Cardiac Enzymes:  Radiological Exams on Admission:  CT ABDOMEN PELVIS W CONTRAST  Result Date: 12/20/2022 CLINICAL DATA:  Abdominal pain, nausea, diarrhea EXAM: CT ABDOMEN AND PELVIS WITH CONTRAST TECHNIQUE: Multidetector CT imaging of the abdomen and pelvis was performed using the standard protocol following bolus administration of intravenous contrast. RADIATION DOSE REDUCTION: This exam was performed according to the departmental dose-optimization program which includes automated exposure control, adjustment of the mA and/or kV according to patient size and/or use of iterative reconstruction technique. CONTRAST:  OMNIPAQUE IOHEXOL 300 MG/ML  SOLN COMPARISON:  09/23/2022 FINDINGS: Lower chest: Small patchy densities are seen in the posterior lower lung fields. There is mild ectasia of the bronchi. There is peribronchial thickening. Coronary artery calcifications are seen. Hepatobiliary: No focal abnormalities are seen in liver. Gallbladder is unremarkable. Pancreas: No focal abnormalities are seen. Spleen: Unremarkable. Adrenals/Urinary Tract: Adrenals are not enlarged. There is no hydronephrosis. There are no renal or ureteral stones. There is 2.5 cm smooth marginated exophytic fluid density lesion in the medial upper pole of left kidney suggesting renal cysts. Ureters are not dilated. Urinary bladder is unremarkable. Stomach/Bowel: Stomach is unremarkable. Proximal small bowel loops are not dilated. There is mild dilation of distal small bowel loops measuring up to 2.6 cm in diameter. Appendix is not dilated. There is no significant wall thickening in colon. There is no pericolic stranding. Vascular/Lymphatic: Scattered arterial calcifications are seen. Scattered atherosclerotic plaques are seen. Reproductive: Unremarkable. Other: There is no ascites or pneumoperitoneum. Umbilical hernia containing fat is seen. Right inguinal hernia containing fat is seen.  Musculoskeletal: No acute findings are seen. IMPRESSION: There is no evidence of intestinal obstruction or pneumoperitoneum. Appendix is not dilated. There is no hydronephrosis. There is mild dilation of distal small bowel loops, possibly suggesting ileus. Coronary artery calcifications are seen.  2.5 cm left renal cyst. Small patchy infiltrates are seen in posterior lower lung fields suggesting scarring or atelectasis/pneumonia. Other findings as described in the body of the report. Electronically Signed   By: Ernie Avena M.D.   On: 12/20/2022 20:31   DG Chest 2 View  Result Date: 12/20/2022 CLINICAL DATA:  COPD.  Shortness of breath for 5 months. EXAM: CHEST - 2 VIEW COMPARISON:  Radiographs 12/13/2022 and 09/23/2022. CT chest 05/31/2022. FINDINGS: The heart size and mediastinal contours are stable with aortic atherosclerosis. There is severe, upper lobe predominant emphysema with chronic scarring inferiorly in the right upper lobe. No superimposed edema, confluent airspace opacity, pneumothorax or significant pleural effusion. Mild thoracic spondylosis without evidence of acute osseous abnormality. IMPRESSION: Severe emphysema with chronic right upper lobe scarring. No acute cardiopulmonary process. Electronically Signed   By: Carey Bullocks M.D.   On: 12/20/2022 17:00    EKG: Independently reviewed. NSR   Assessment and Plan: CAP (community acquired pneumonia) Follow-up blood cultures as ordered.  We will also check COVID-19 and influenza.  We will treat the patient with Unasyn.  Given atypical presentation I will also check swallow screening.  Hypoxia Borderline hypoxia that was reported at home by  patient's home health nurse.  Has not since been observed in the ER.  Felt to be due to pneumonia.    Patient not sure of home medications.  Pharmacy still working on collecting home medications,  Reconciliation pending  Advance Care Planning:   Code Status: Full Code   Consults:  Speech pathology consutl placed in epic  Family Communication: patient lives with son who has been comunicating frequently with patient.  Severity of Illness: The appropriate patient status for this patient is INPATIENT. Inpatient status is judged to be reasonable and necessary in order to provide the required intensity of service to ensure the patient's safety. The patient's presenting symptoms, physical exam findings, and initial radiographic and laboratory data in the context of their chronic comorbidities is felt to place them at high risk for further clinical deterioration. Furthermore, it is not anticipated that the patient will be medically stable for discharge from the hospital within 2 midnights of admission.   * I certify that at the point of admission it is my clinical judgment that the patient will require inpatient hospital care spanning beyond 2 midnights from the point of admission due to high intensity of service, high risk for further deterioration and high frequency of surveillance required.*  Author: Nolberto Hanlon, MD 12/20/2022 9:43 PM  For on call review [email or web address].

## 2022-12-20 NOTE — Progress Notes (Signed)
Patient is alert and oriented x3. Has some memory impairment. Could not help with admission questions. Called daughter Danny [name] who is listed in patient's chart and she stated that she did not know her father's information and that the son that lives with patient and her are estranged. Called son Danny [name] [phone]) but he was unable to fully answer questions. Patient states that he has home health but is unaware of the number. Says he gets around in a wheelchair at home. Voiced incontinence and had diaper on upon arrival from ED-cleaned and placed purewick. Skin has blancheable redness to bilateral buttocks, some groin and scrotal redness and multiple small abrasions to r-shin. Denied pain and additional needs.

## 2022-12-20 NOTE — Telephone Encounter (Addendum)
Harriett Sine from [facility] called stating pt has been in the hospital last week and he stated they did not do anything for him. Harriett Sine stated he has an 89 O2 saturation and his BP is 156/63 with a 91 pulse. Harriett Sine stated his blood glucose level is fine at 155 and pt has no temp. Harriett Sine stated pt is feeling dizzy and has shortness of breath. Harriett Sine stated she thinks he may need adult protection service because his son works all the time and he does not get along with the daughter. Harriett Sine stated the house is a mess, dirty and have ants all over. Harriett Sine stated this weekend the pt was nausea and vominting. Harriett Sine stated the pt had a sandwich today and  also has an appontment on 5/28 where transportation has to take him because no one else will. Harriett Sine stated the pt can not take care of hisself

## 2022-12-20 NOTE — Assessment & Plan Note (Signed)
Borderline hypoxia that was reported at home by patient's home health nurse.  Has not since been observed in the ER.  Felt to be due to pneumonia.

## 2022-12-20 NOTE — ED Provider Notes (Signed)
[facility] Provider Note    Event Date/Time   First MD Initiated Contact with Patient 12/20/22 1844     (approximate)   History   Shortness of Breath   HPI  Danny TERUEL Sr. is a 72 y.o. male  who presents to the emergency department today because of concern for shortness of breath. Patient states he has history of COPD but has been feeling worse over the past few days. The patient had home health nurse come out and found his oxygen saturation was in the 80s. The patient denies any significant chest pain. Does state he has been having some nausea and abdominal pain which has been accompanied by diarrhea. Denies any fevers.    Physical Exam   Triage Vital Signs: ED Triage Vitals [12/20/22 1537]  Enc Vitals Group     BP 103/68     Pulse Rate 91     Resp 18     Temp 98.6 F (37 C)     Temp Source Oral     SpO2 91 %     Weight      Height      Head Circumference      Peak Flow      Pain Score 0     Pain Loc      Pain Edu?      Excl. in GC?     Most recent vital signs: Vitals:   12/20/22 1537  BP: 103/68  Pulse: 91  Resp: 18  Temp: 98.6 F (37 C)  SpO2: 91%   General: Awake, alert, oriented. CV:  Good peripheral perfusion. Regular rate and rhythm Resp:  Slightly increased work of breathing. Abd:  No distention. Slight diffuse tenderness to palpation.    ED Results / Procedures / Treatments   Labs (all labs ordered are listed, but only abnormal results are displayed) Labs Reviewed  CBC - Abnormal; Notable for the following components:      Result Value   WBC 12.8 (*)    Hemoglobin 17.2 (*)    HCT 54.9 (*)    All other components within normal limits  BASIC METABOLIC PANEL     EKG  I, Phineas Semen, attending physician, personally viewed and interpreted this EKG  EKG Time: 1545 Rate: 93 Rhythm: sinus rhythm  Axis: normal Intervals: qtc 460 QRS: normal ST changes: no st elevation Impression: abnormal  ekg    RADIOLOGY I independently interpreted and visualized the CXR. My interpretation: No pneumonia Radiology interpretation:  IMPRESSION:  Severe emphysema with chronic right upper lobe scarring. No acute  cardiopulmonary process.    I independently interpreted and visualized the ct abd/pel. My interpretation: No free air. Radiology interpretation:   IMPRESSION:  There is no evidence of intestinal obstruction or pneumoperitoneum.  Appendix is not dilated. There is no hydronephrosis.    There is mild dilation of distal small bowel loops, possibly  suggesting ileus.    Coronary artery calcifications are seen.  2.5 cm left renal cyst.    Small patchy infiltrates are seen in posterior lower lung fields  suggesting scarring or atelectasis/pneumonia.    Other findings as described in the body of the report.      PROCEDURES:  Critical Care performed: Yes  CRITICAL CARE Performed by: Phineas Semen   Total critical care time: 30 minutes  Critical care time was exclusive of separately billable procedures and treating other patients.  Critical care was necessary to treat or prevent imminent or  life-threatening deterioration.  Critical care was time spent personally by me on the following activities: development of treatment plan with patient and/or surrogate as well as nursing, discussions with consultants, evaluation of patient's response to treatment, examination of patient, obtaining history from patient or surrogate, ordering and performing treatments and interventions, ordering and review of laboratory studies, ordering and review of radiographic studies, pulse oximetry and re-evaluation of patient's condition.   Procedures    MEDICATIONS ORDERED IN ED: Medications - No data to display   IMPRESSION / MDM / ASSESSMENT AND PLAN / ED COURSE  I reviewed the triage vital signs and the nursing notes.                              Differential diagnosis includes,  but is not limited to, pneumonia, diverticulitis, colitis, gastroenteritis  Patient's presentation is most consistent with acute presentation with potential threat to life or bodily function.   Patient presented to the emergency department today with concerns for shortness of breath.  On exam patient does have increased work of breathing.  He was hypoxic at home although initial oxygen level without hypoxia here in the emergency department.  On exam patient also had complaints of diarrhea and abdominal discomfort.  Chest x-ray did not show any obvious pneumonia.  Patient did have slight leukocytosis and blood work.  Did obtain a CT scan to evaluate for possible intra-abdominal infection.  While this did not show any concerning intra-abdominal abnormalities there was concern for possible pneumonia.  Do think this could explain the shortness of breath and a leukocytosis.  Ordered multiple broad-spectrum IV antibiotics.  Discussed with Dr. Maryjean Ka with the hospitalist service who will plan on admission.     FINAL CLINICAL IMPRESSION(S) / ED DIAGNOSES   Final diagnoses:  Shortness of breath  Pneumonia due to infectious organism, unspecified laterality, unspecified part of lung      Note:  This document was prepared using Dragon voice recognition software and may include unintentional dictation errors.    Phineas Semen, MD 12/21/22 [phone]

## 2022-12-20 NOTE — ED Triage Notes (Signed)
Patient to ED via [facility] from home for SOB x5 months. Homehealth RN advised patient to come in to be seen. Same occurred on 5/9. Hx of COPD. 95% RA. Patient also states he "has the runs" and will need to be cleaned up.

## 2022-12-20 NOTE — Telephone Encounter (Signed)
Agree with taking to ED

## 2022-12-21 ENCOUNTER — Encounter: Payer: Self-pay | Admitting: Internal Medicine

## 2022-12-21 DIAGNOSIS — J439 Emphysema, unspecified: Secondary | ICD-10-CM | POA: Diagnosis not present

## 2022-12-21 DIAGNOSIS — J9621 Acute and chronic respiratory failure with hypoxia: Secondary | ICD-10-CM

## 2022-12-21 DIAGNOSIS — D751 Secondary polycythemia: Secondary | ICD-10-CM | POA: Diagnosis not present

## 2022-12-21 DIAGNOSIS — J189 Pneumonia, unspecified organism: Secondary | ICD-10-CM | POA: Diagnosis not present

## 2022-12-21 LAB — RESPIRATORY PANEL BY PCR

## 2022-12-21 LAB — BASIC METABOLIC PANEL
Anion gap: 8 (ref 5–15)
BUN: 27 mg/dL — ABNORMAL HIGH (ref 8–23)
CO2: 24 mmol/L (ref 22–32)
Calcium: 9.2 mg/dL (ref 8.9–10.3)
Chloride: 109 mmol/L (ref 98–111)
Creatinine, Ser: 1.13 mg/dL (ref 0.61–1.24)
GFR, Estimated: >60 mL/min (ref >60)
Glucose, Bld: 156 mg/dL — ABNORMAL HIGH (ref 70–99)
Potassium: 3.7 mmol/L (ref 3.5–5.1)
Sodium: 137 mmol/L (ref 135–145)

## 2022-12-21 LAB — HEPATIC FUNCTION PANEL
ALT: 28 U/L (ref 0–44)
AST: 18 U/L (ref 15–41)
Albumin: 3.5 g/dL (ref 3.5–5.0)
Alkaline Phosphatase: 80 U/L (ref 38–126)
Bilirubin, Direct: 0.1 mg/dL (ref 0.0–0.2)
Indirect Bilirubin: 0.4 mg/dL (ref 0.3–0.9)
Total Bilirubin: 0.5 mg/dL (ref 0.3–1.2)
Total Protein: 6.9 g/dL (ref 6.5–8.1)

## 2022-12-21 LAB — CBC
HCT: 48.6 % (ref 39.0–52.0)
Hemoglobin: 15.9 g/dL (ref 13.0–17.0)
MCH: 30.5 pg (ref 26.0–34.0)
MCHC: 32.7 g/dL (ref 30.0–36.0)
MCV: 93.1 fL (ref 80.0–100.0)
Platelets: 290 10*3/uL (ref 150–400)
RBC: 5.22 MIL/uL (ref 4.22–5.81)
RDW: 13 % (ref 11.5–15.5)
WBC: 10.9 10*3/uL — ABNORMAL HIGH (ref 4.0–10.5)
nRBC: 0 % (ref 0.0–0.2)

## 2022-12-21 LAB — GLUCOSE, CAPILLARY
Glucose-Capillary: 178 mg/dL — ABNORMAL HIGH (ref 70–99)
Glucose-Capillary: 249 mg/dL — ABNORMAL HIGH (ref 70–99)
Glucose-Capillary: 184 mg/dL — ABNORMAL HIGH (ref 70–99)
Glucose-Capillary: 375 mg/dL — ABNORMAL HIGH (ref 70–99)

## 2022-12-21 LAB — BRAIN NATRIURETIC PEPTIDE: B Natriuretic Peptide: 78.7 pg/mL (ref 0.0–100.0)

## 2022-12-21 LAB — APTT: aPTT: 31 seconds (ref 24–36)

## 2022-12-21 LAB — PROCALCITONIN: Procalcitonin: 0.15 ng/mL

## 2022-12-21 LAB — CULTURE, BLOOD (ROUTINE X 2)
Culture: NO GROWTH
Special Requests: ADEQUATE

## 2022-12-21 MED ORDER — SPIRONOLACTONE 25 MG PO TABS
25.0000 mg | ORAL_TABLET | Freq: Every day | ORAL | Status: DC
  Administered 2022-12-21 – 2022-12-23 (×3): 25 mg via ORAL
  Filled 2022-12-21 (×3): qty 1

## 2022-12-21 MED ORDER — CARVEDILOL 25 MG PO TABS
25.0000 mg | ORAL_TABLET | Freq: Two times a day (BID) | ORAL | Status: DC
  Administered 2022-12-21 – 2022-12-23 (×4): 25 mg via ORAL
  Filled 2022-12-21 (×4): qty 1

## 2022-12-21 MED ORDER — ATORVASTATIN CALCIUM 20 MG PO TABS
80.0000 mg | ORAL_TABLET | Freq: Every day | ORAL | Status: DC
  Administered 2022-12-21 – 2022-12-23 (×3): 80 mg via ORAL
  Filled 2022-12-21 (×3): qty 4

## 2022-12-21 MED ORDER — ASPIRIN 81 MG PO TBEC
81.0000 mg | DELAYED_RELEASE_TABLET | Freq: Every day | ORAL | Status: DC
  Administered 2022-12-21 – 2022-12-23 (×3): 81 mg via ORAL
  Filled 2022-12-21 (×3): qty 1

## 2022-12-21 MED ORDER — AMLODIPINE BESYLATE 10 MG PO TABS
10.0000 mg | ORAL_TABLET | Freq: Every day | ORAL | Status: DC
  Administered 2022-12-21 – 2022-12-23 (×3): 10 mg via ORAL
  Filled 2022-12-21 (×3): qty 1

## 2022-12-21 MED ORDER — CLOPIDOGREL BISULFATE 75 MG PO TABS
75.0000 mg | ORAL_TABLET | Freq: Every day | ORAL | Status: DC
  Administered 2022-12-21 – 2022-12-23 (×3): 75 mg via ORAL
  Filled 2022-12-21 (×3): qty 1

## 2022-12-21 MED ORDER — ESCITALOPRAM OXALATE 10 MG PO TABS
10.0000 mg | ORAL_TABLET | Freq: Every day | ORAL | Status: DC
  Administered 2022-12-21 – 2022-12-23 (×3): 10 mg via ORAL
  Filled 2022-12-21 (×3): qty 1

## 2022-12-21 MED ORDER — TAMSULOSIN HCL 0.4 MG PO CAPS
0.4000 mg | ORAL_CAPSULE | Freq: Every day | ORAL | Status: DC
  Administered 2022-12-21 – 2022-12-23 (×3): 0.4 mg via ORAL
  Filled 2022-12-21 (×3): qty 1

## 2022-12-21 MED ORDER — EMPAGLIFLOZIN 25 MG PO TABS
25.0000 mg | ORAL_TABLET | Freq: Every day | ORAL | Status: DC
  Administered 2022-12-21 – 2022-12-23 (×3): 25 mg via ORAL
  Filled 2022-12-21 (×3): qty 1

## 2022-12-21 NOTE — Progress Notes (Signed)
SATURATION QUALIFICATIONS: (This note is used to comply with regulatory documentation for home oxygen)  Patient Saturations on Room Air at Rest = 86%  Patient Saturations on 2 Liters of oxygen at rest = 96%  Patient is wheelchair at baseline. Cannot ween patient off of oxygen at rest.

## 2022-12-21 NOTE — Progress Notes (Signed)
[facility]  - [place] [facility]   PATIENT NAME: Danny [name]    MR#:  [id number]  DATE OF BIRTH:  11/04/1950  SUBJECTIVE:  patient lives at home with his son. He is wheelchair-bound due to amputation. Came in with increasing shortness of breath and vomiting. No fever or chills. Able to tolerated PO diet now no vomiting reported. No diarrhea or constipation. Breathing is better. Patient desaturate easily on activity on room air.    VITALS:  Blood pressure (!) 153/95, pulse 62, temperature 97.7 F (36.5 C), resp. rate 16, height 6' (1.829 m), weight 96.9 kg, SpO2 96 %.  PHYSICAL EXAMINATION:   GENERAL:  72 y.o.-year-old patient with no acute distress.  LUNGS: decreased breath sounds bilaterally, no wheezing CARDIOVASCULAR: S1, S2 normal. No murmur   ABDOMEN: Soft, nontender, nondistended. Bowel sounds present.  EXTREMITIES amputation stump+ NEUROLOGIC: nonfocal  patient is alert and awake  LABORATORY PANEL:  CBC Recent Labs  Lab 12/21/22 0612  WBC 10.9*  HGB 15.9  HCT 48.6  PLT 290    Chemistries  Recent Labs  Lab 12/21/22 0612  NA 137  K 3.7  CL 109  CO2 24  GLUCOSE 156*  BUN 27*  CREATININE 1.13  CALCIUM 9.2  AST 18  ALT 28  ALKPHOS 80  BILITOT 0.5   Cardiac Enzymes No results for input(s): "TROPONINI" in the last 168 hours. RADIOLOGY:  CT ABDOMEN PELVIS W CONTRAST  Result Date: 12/20/2022 CLINICAL DATA:  Abdominal pain, nausea, diarrhea EXAM: CT ABDOMEN AND PELVIS WITH CONTRAST TECHNIQUE: Multidetector CT imaging of the abdomen and pelvis was performed using the standard protocol following bolus administration of intravenous contrast. RADIATION DOSE REDUCTION: This exam was performed according to the departmental dose-optimization program which includes automated exposure control, adjustment of the mA and/or kV according to patient size and/or use of iterative reconstruction technique. CONTRAST:  OMNIPAQUE IOHEXOL 300 MG/ML  SOLN  COMPARISON:  09/23/2022 FINDINGS: Lower chest: Small patchy densities are seen in the posterior lower lung fields. There is mild ectasia of the bronchi. There is peribronchial thickening. Coronary artery calcifications are seen. Hepatobiliary: No focal abnormalities are seen in liver. Gallbladder is unremarkable. Pancreas: No focal abnormalities are seen. Spleen: Unremarkable. Adrenals/Urinary Tract: Adrenals are not enlarged. There is no hydronephrosis. There are no renal or ureteral stones. There is 2.5 cm smooth marginated exophytic fluid density lesion in the medial upper pole of left kidney suggesting renal cysts. Ureters are not dilated. Urinary bladder is unremarkable. Stomach/Bowel: Stomach is unremarkable. Proximal small bowel loops are not dilated. There is mild dilation of distal small bowel loops measuring up to 2.6 cm in diameter. Appendix is not dilated. There is no significant wall thickening in colon. There is no pericolic stranding. Vascular/Lymphatic: Scattered arterial calcifications are seen. Scattered atherosclerotic plaques are seen. Reproductive: Unremarkable. Other: There is no ascites or pneumoperitoneum. Umbilical hernia containing fat is seen. Right inguinal hernia containing fat is seen. Musculoskeletal: No acute findings are seen. IMPRESSION: There is no evidence of intestinal obstruction or pneumoperitoneum. Appendix is not dilated. There is no hydronephrosis. There is mild dilation of distal small bowel loops, possibly suggesting ileus. Coronary artery calcifications are seen.  2.5 cm left renal cyst. Small patchy infiltrates are seen in posterior lower lung fields suggesting scarring or atelectasis/pneumonia. Other findings as described in the body of the report. Electronically Signed   By: Ernie Avena M.D.   On: 12/20/2022 20:31   DG Chest 2 View  Result  Date: 12/20/2022 CLINICAL DATA:  COPD.  Shortness of breath for 5 months. EXAM: CHEST - 2 VIEW COMPARISON:  Radiographs  12/13/2022 and 09/23/2022. CT chest 05/31/2022. FINDINGS: The heart size and mediastinal contours are stable with aortic atherosclerosis. There is severe, upper lobe predominant emphysema with chronic scarring inferiorly in the right upper lobe. No superimposed edema, confluent airspace opacity, pneumothorax or significant pleural effusion. Mild thoracic spondylosis without evidence of acute osseous abnormality. IMPRESSION: Severe emphysema with chronic right upper lobe scarring. No acute cardiopulmonary process. Electronically Signed   By: Carey Bullocks M.D.   On: 12/20/2022 17:00    Assessment and Plan  Danny Mountain Rolph Sr. is a 72 y.o. male with medical history significant of COPD, not on oxygen, CAD, hypertension, diabetes comes in the emergency room with increasing shortness of breath for last few days. Patient was found to be hypoxic 88 to 90% on room air. Denies any history of smoking. Has some cough. Patient also had with episodes of vomiting for couple days per patient's son [employer]. Denies any diarrhea or constipation. Denies abdominal pain. Denies any fever. Patient was admitted with acute hypoxic respiratory failure secondary to COPD with possible pneumonia.  Acute hypoxic respiratory failure secondary to COPD exacerbation and pneumonia in the setting of vomiting. -- Continue IV unasyn -- patient able to tolerated PO diet. Received IV fluids. -- Not wheezing. Continue nebulizer/inhalers -- patient qualifies for home oxygen. He is wheelchair-bound he D sats on minimal activity. -- CT chest evident with patchy pneumonia by basilar. -- White count improving.  Emphysema/COPD with history of tobacco in the past -- patient will need oxygen to go home with. Discussed with son Danny [name] he is in agreement.  Hypertension -- resume home meds  Type II diabetes, hyperglycemia -- sliding scale insulin -- patient uses trulicity and Jardiance at home (per med rec)  History of  amputation. Patient is wheelchair-bound. -- He is able to get around the house to his ADLs per son. Patient is home health. --He is also followed by Child psychotherapist.  TOC for discharge planning     Procedures: Family communication :son Danny [name] Consults : CODE STATUS: full DVT Prophylaxis :lovenox Level of care: Med-Surg Status is: Inpatient Remains inpatient appropriate because: copd/pneumonia    TOTAL TIME TAKING CARE OF THIS PATIENT: 35 minutes.  >50% time spent on counselling and coordination of care  Note: This dictation was prepared with Dragon dictation along with smaller phrase technology. Any transcriptional errors that result from this process are unintentional.  Enedina Finner M.D    [facility]   CC: Primary care physician; [name], MD

## 2022-12-21 NOTE — TOC CM/SW Note (Signed)
Patient to DC tomorrow, needs new home o2.  O2 referral mad to Danielle with [employer] - notified her of plan for DC home tomorrow.  Alfonso Ramus, [place] [phone]

## 2022-12-21 NOTE — Evaluation (Signed)
Clinical/Bedside Swallow Evaluation Patient Details  Name: Danny [name] Sr. MRN: [id number] Date of Birth: [birth year]  Today's Date: 12/21/2022 Time: SLP Start Time (ACUTE ONLY): 1000 SLP Stop Time (ACUTE ONLY): 1100 SLP Time Calculation (min) (ACUTE ONLY): 60 min  Past Medical History:  Past Medical History:  Diagnosis Date   CAD (coronary artery disease)    a. 01/2010 PCI of LAD; b. 09/2014 PCI/DES of mLAD due to ISR. D1 80, D2 80(jailed), LCX 61m; c. 07/2016 NSTEMI/Cath: LM nl, LAD 59m ISR, 40d, D1 90ost, 80p, D2 90ost, RI min irregs, LCX min irregs, OM1 90 small, OM2/3 min irregs, RCA min irregs, RPLB1 90, EF 25-35%.   Chronic combined systolic and diastolic CHF (congestive heart failure) (HCC)    a. 07/2016 Echo: EF 30%, severe septal/anterior HK, Gr1 DD.   CKD (chronic kidney disease), stage III (HCC)    COPD (chronic obstructive pulmonary disease) (HCC)    DM2 (diabetes mellitus, type 2) (HCC)    Erectile dysfunction    HLD (hyperlipidemia)    HTN (hypertension)    Ischemic cardiomyopathy    a. 07/2016 Echo: EF 30% w/ sev septal/ant HK. Gr1 DD.   Past Surgical History:  Past Surgical History:  Procedure Laterality Date   AMPUTATION Left 11/10/2020   Procedure: AMPUTATION BELOW KNEE;  Surgeon: Annice Needy, MD;  Location: [facility] ORS;  Service: General;  Laterality: Left;   BELOW KNEE LEG AMPUTATION     CAD: stent to the LAD     CARDIAC CATHETERIZATION  10/01/2014   CARDIAC CATHETERIZATION N/A 07/23/2016   Procedure: Left Heart Cath and Coronary Angiography;  Surgeon: Iran Ouch, MD;  Location: [facility] INVASIVE CV LAB;  Service: Cardiovascular;  Laterality: N/A;   CORONARY ANGIOPLASTY WITH STENT PLACEMENT  10/01/2014   LOWER EXTREMITY ANGIOGRAPHY Left 10/31/2020   Procedure: LOWER EXTREMITY ANGIOGRAPHY;  Surgeon: Annice Needy, MD;  Location: [facility] INVASIVE CV LAB;  Service: Cardiovascular;  Laterality: Left;   LOWER EXTREMITY ANGIOGRAPHY Right 11/20/2021   Procedure: Lower  Extremity Angiography;  Surgeon: Annice Needy, MD;  Location: [facility] INVASIVE CV LAB;  Service: Cardiovascular;  Laterality: Right;   HPI:  Pt is a 72 y.o. male with medical history significant of COPD not using home O2, CAD, CHF, CKD, DM2, HLD, HTN, and Amputation of lower leg.  Patient reports not feeling well for a couple of days and onset of shortness of breath about 2 weeks ago and he would feel short of breath while walking on level ground within the house. Patient was recently evaluated at [facility] ER about a week ago for this complaint as well.  Patient does not describe any orthopnea.  Patient does not report any progressive nature of the shortness of breath.  However, he has had increasing fatigue in the last couple of days and nausea/vomiting last weekend. No coughing or leg swelling reported.  Patient was evaluated by home health nurse on day of admit and found to have 89% FiO2 SpO2 on room air.  Patient is sent to ER for further evaluation. Pt receives Home Healt Nursing Care in the Home.  CXR: Severe emphysema with chronic right upper lobe scarring. No acute  cardiopulmonary process.    Assessment / Plan / Recommendation  Clinical Impression   Pt seen for BSE today. Pt awake, verbal - talkative. Followed instructions appropriately. Noted ease of WOB w/ exertion of talking. Edentulous status.   On [place] O2 2L; afebrile; wbc 10.9.  Pt appears to present w/  functional oropharyngeal phase swallowing w/ No overt oropharyngeal phase dysphagia noted, No neuromuscular deficits noted. Pt consumed po trials w/ No overt, clinical s/s of aspiration during po trials. Pt appears at reduced risk for aspiration following general aspiration precautions.  However, pt does have challenging factors that could impact oropharyngeal swallowing to include illness/hospitalization and Edentulous status at baseline. These factors can increase risk for aspiration, dysphagia as well as decreased oral intake overall.   During  po trials, pt consumed all consistencies w/ no overt coughing, decline in vocal quality, or change in respiratory presentation during/post trials. O2 sats remained in upper 90s when checked. Pt was instructed on using min rest breaks to lessen any WOB from exertion of po trials in setting of declined pulmonary status(baseline). Oral phase appeared grossly [facility] w/ timely bolus management, mashing/gumming mastication, and control of bolus propulsion for A-P transfer for swallowing. Oral clearing achieved w/ all trial consistencies -- moistened, soft foods given in setting of Edentulous status.  OM Exam appeared [facility] w/ no unilateral weakness noted. Speech Clear. Pt fed self w/ setup support.   Recommend a fairly Regular consistency diet w/ well-Cut meats/foods, moistened foods for ease of oral phase and for conservation of energy; Thin liquids. Recommend general aspiration precautions and rest breaks during meals for conservation of energy; tray setup and support for meals. Pills 1-2 at a time for ease of swallowing and/or WHOLE in Puree for ease of swallowing.  Education given on Pills in Puree; food consistencies and easy to eat options; general aspiration precautions to pt and Dtr. NSG to reconsult if any new needs arise while admitted. NSG updated, agreed. MD updated. Recommend Dietician f/u for support. SLP Visit Diagnosis: Dysphagia, unspecified (R13.10) (Edentulous)    Aspiration Risk   (reduced following general aspiration precautions)    Diet Recommendation   a fairly Regular consistency diet w/ well-Cut meats/foods, moistened foods for ease of oral phase and for conservation of energy; Thin liquids. Recommend general aspiration precautions and rest breaks during meals for conservation of energy; tray setup and support for meals.  Medication Administration: Whole meds with liquid (or whole in puree if desired)    Other  Recommendations Recommended Consults:  (Dietician) Oral Care Recommendations:  Oral care BID;Patient independent with oral care    Recommendations for follow up therapy are one component of a multi-disciplinary discharge planning process, led by the attending physician.  Recommendations may be updated based on patient status, additional functional criteria and insurance authorization.  Follow up Recommendations No SLP follow up      Assistance Recommended at Discharge  intermittent  Functional Status Assessment Patient has not had a recent decline in their functional status  Frequency and Duration  (n/a)   (n/a)       Prognosis Prognosis for improved oropharyngeal function: Good Barriers to Reach Goals: Time post onset;Severity of deficits Barriers/Prognosis Comment: edentulous at baseline      Swallow Study   General Date of Onset: 12/20/22 HPI: Pt is a 72 y.o. male with medical history significant of COPD not using home O2, CAD, CHF, CKD, DM2, HLD, HTN, and Amputation of lower leg.  Patient reports not feeling well for a couple of days and onset of shortness of breath about 2 weeks ago and he would feel short of breath while walking on level ground within the house. Patient was recently evaluated at [facility] ER about a week ago for this complaint as well.  Patient does not describe any orthopnea.  Patient  does not report any progressive nature of the shortness of breath.  However, he has had increasing fatigue in the last couple of days and nausea/vomiting last weekend. No coughing or leg swelling reported.  Patient was evaluated by home health nurse on day of admit and found to have 89% FiO2 SpO2 on room air.  Patient is sent to ER for further evaluation. Pt receives Home Healt Nursing Care in the Home.  CXR: Severe emphysema with chronic right upper lobe scarring. No acute  cardiopulmonary process. Type of Study: Bedside Swallow Evaluation Previous Swallow Assessment: none Diet Prior to this Study: Regular;Thin liquids (Level 0) Temperature Spikes Noted: No (wbc  10.9) Respiratory Status: Nasal cannula (2L) History of Recent Intubation: No Behavior/Cognition: Alert;Cooperative;Pleasant mood;Distractible;Requires cueing Oral Cavity Assessment: Within Functional Limits Oral Care Completed by SLP: Yes Oral Cavity - Dentition: Edentulous (does not wear dentures) Vision: Functional for self-feeding Self-Feeding Abilities: Able to feed self;Needs set up Patient Positioning: Upright in bed (needed support) Baseline Vocal Quality: Normal Volitional Cough: Strong Volitional Swallow: Able to elicit    Oral/Motor/Sensory Function Overall Oral Motor/Sensory Function: Within functional limits   Ice Chips Ice chips: Within functional limits Presentation: Spoon (fed; 2 trials)   Thin Liquid Thin Liquid: Within functional limits Presentation: Cup;Self Fed;Straw (~6 ozs) Other Comments: water, juice    Nectar Thick Nectar Thick Liquid: Not tested   Honey Thick Honey Thick Liquid: Not tested   Puree Puree: Within functional limits Presentation: Self Fed;Spoon (6-7 tirals)   Solid     Solid: Within functional limits (in setting of Edentulous status) Presentation: Self Fed (8-9 trials) Other Comments: moistened foods         Jerilynn Som, MS, CCC-SLP Speech Language Pathologist Rehab Services; [facility] [phone] (ascom) Estelle Skibicki 12/21/2022,3:25 PM

## 2022-12-22 DIAGNOSIS — D751 Secondary polycythemia: Secondary | ICD-10-CM | POA: Diagnosis not present

## 2022-12-22 DIAGNOSIS — J189 Pneumonia, unspecified organism: Secondary | ICD-10-CM | POA: Diagnosis not present

## 2022-12-22 DIAGNOSIS — J439 Emphysema, unspecified: Secondary | ICD-10-CM | POA: Diagnosis not present

## 2022-12-22 DIAGNOSIS — J9621 Acute and chronic respiratory failure with hypoxia: Secondary | ICD-10-CM | POA: Diagnosis not present

## 2022-12-22 LAB — GLUCOSE, CAPILLARY
Glucose-Capillary: 178 mg/dL — ABNORMAL HIGH (ref 70–99)
Glucose-Capillary: 204 mg/dL — ABNORMAL HIGH (ref 70–99)
Glucose-Capillary: 245 mg/dL — ABNORMAL HIGH (ref 70–99)
Glucose-Capillary: 267 mg/dL — ABNORMAL HIGH (ref 70–99)
Glucose-Capillary: 333 mg/dL — ABNORMAL HIGH (ref 70–99)

## 2022-12-22 LAB — BLOOD CULTURE ID PANEL (REFLEXED) - BCID2

## 2022-12-22 LAB — CULTURE, BLOOD (ROUTINE X 2): Special Requests: ADEQUATE

## 2022-12-22 MED ORDER — AMOXICILLIN-POT CLAVULANATE 875-125 MG PO TABS
1.0000 | ORAL_TABLET | Freq: Two times a day (BID) | ORAL | Status: DC
  Administered 2022-12-22 – 2022-12-23 (×3): 1 via ORAL
  Filled 2022-12-22 (×3): qty 1

## 2022-12-22 MED ORDER — AMOXICILLIN-POT CLAVULANATE 875-125 MG PO TABS: 1.0000 | ORAL_TABLET | Freq: Two times a day (BID) | ORAL | 0 refills | Status: AC

## 2022-12-22 MED ORDER — SODIUM CHLORIDE 0.9 % IV SOLN: INTRAVENOUS | Status: DC | PRN

## 2022-12-22 NOTE — Discharge Instructions (Signed)
[place] oxygen 2 L per minute as instructed to use your inhalers the nebulizer is as before

## 2022-12-22 NOTE — Discharge Summary (Signed)
Physician Discharge Summary   Patient: Danny MERKLIN Sr. MRN: [id number] DOB: 30-Sep-1950  Admit date:     12/20/2022  Discharge date: 12/22/22  Discharge Physician: Enedina Finner   PCP: [name], MD   Recommendations at discharge:   use your oxygen, inhaler, nebulizer. PCP in 1 to 2 weeks  Discharge Diagnoses: Principal Problem:   Pneumonia due to infectious organism Active Problems:   Pulmonary emphysema (HCC)   Hypoxia   CAP (community acquired pneumonia)   Erythrocytosis   Acute on chronic respiratory failure with hypoxia (HCC)  Danny Friends Muraski Sr. is a 72 y.o. male with medical history significant of COPD, not on oxygen, CAD, hypertension, diabetes comes in the emergency room with increasing shortness of breath for last few days. Patient was found to be hypoxic 88 to 90% on room air. Denies any history of smoking. Has some cough. Patient also had with episodes of vomiting for couple days per patient's son [employer]. Denies any diarrhea or constipation. Denies abdominal pain. Denies any fever. Patient was admitted with acute hypoxic respiratory failure secondary to COPD with possible pneumonia.   Acute hypoxic respiratory failure secondary to COPD exacerbation and pneumonia in the setting of vomiting. -- Continue IV unasyn--change to po abxs -- patient able to tolerated PO diet. Received IV fluids. -- Not wheezing. Continue nebulizer/inhalers -- patient qualifies for home oxygen. He is wheelchair-bound  De- sats on minimal activity. -- CT chest evident with patchy pneumonia bibasilar. -- White count improving. No fever. Feels at baseline today   Emphysema/COPD with history of tobacco in the past -- patient will need oxygen to go home with. Discussed with son Tadarrius Moga he is in agreement.   Hypertension -- resume home meds   Type II diabetes, hyperglycemia -- sliding scale insulin -- patient uses trulicity and Jardiance at home (per med rec)   History of  amputation. Patient is wheelchair-bound. -- He is able to get around the house to his ADLs per son. Patient is home health. --He is also followed by Child psychotherapist.   Overall improving D/c home with oxygen  Unable to leave VM for [name]      Diet recommendation:  Discharge Diet Orders (From admission, onward)     Start     Ordered   12/22/22 0000  Diet - low sodium heart healthy        12/22/22 1143            DISCHARGE MEDICATION: Allergies as of 12/22/2022       Reactions   Metformin And Related Diarrhea   If he takes it twice a day it causes diarhea, so he reduced to once daily        Medication List     TAKE these medications    acetaminophen 325 MG tablet Commonly known as: TYLENOL Take 2 tablets (650 mg total) by mouth every 4 (four) hours as needed for mild pain (or temp > 37.5 C (99.5 F)).   albuterol (2.5 MG/3ML) 0.083% nebulizer solution Commonly known as: PROVENTIL Take 3 mLs (2.5 mg total) by nebulization every 6 (six) hours as needed for wheezing or shortness of breath.   ProAir RespiClick 108 (90 Base) MCG/ACT Aepb Generic drug: Albuterol Sulfate Inhale 1 puff into the lungs every 6 (six) hours as needed.   amLODipine 10 MG tablet Commonly known as: NORVASC Take 1 tablet (10 mg total) by mouth daily.   amoxicillin-clavulanate 875-125 MG tablet Commonly known as: AUGMENTIN  Take 1 tablet by mouth every 12 (twelve) hours for 5 days.   aspirin EC 81 MG tablet Take 1 tablet (81 mg total) by mouth daily.   atorvastatin 80 MG tablet Commonly known as: LIPITOR Take 1 tablet (80 mg total) by mouth daily.   carvedilol 25 MG tablet Commonly known as: COREG Take 1 tablet (25 mg total) by mouth 2 (two) times daily with a meal.   clopidogrel 75 MG tablet Commonly known as: Plavix Take 1 tablet (75 mg total) by mouth daily.   empagliflozin 25 MG Tabs tablet Commonly known as: Jardiance Take 1 tablet (25 mg total) by mouth daily.    escitalopram 10 MG tablet Commonly known as: LEXAPRO Take 1 tablet (10 mg total) by mouth daily.   FreeStyle Libre 2 Reader Devi 1 Device by Does not apply route 4 (four) times daily.   FreeStyle Libre 2 Sensor Misc Check blood sugars   nitroGLYCERIN 0.4 MG SL tablet Commonly known as: NITROSTAT Place 0.4 mg under the tongue every 5 (five) minutes as needed for chest pain.   spironolactone 25 MG tablet Commonly known as: ALDACTONE TAKE 1 TABLET (25 MG TOTAL) BY MOUTH DAILY.   tamsulosin 0.4 MG Caps capsule Commonly known as: FLOMAX TAKE 1 CAPSULE BY MOUTH EVERY DAY   Trulicity 4.5 MG/0.5ML Sopn Generic drug: Dulaglutide Inject 4.5 mg into the skin once a week.               Durable Medical Equipment  (From admission, onward)           Start     Ordered   12/21/22 1411  For home use only DME oxygen  Once       Question Answer Comment  Length of Need 6 Months   Mode or (Route) Nasal cannula   Liters per Minute 2   Frequency Continuous (stationary and portable oxygen unit needed)   Oxygen conserving device Yes   Oxygen delivery system Gas      12/21/22 1410            Follow-up Information     [name], MD. Schedule an appointment as soon as possible for a visit in 1 week(s).   Specialty: Family Medicine Why: hospital f/u Contact information: [address] [place] [address] [place] [address] [phone]                Discharge Exam: Ceasar Mons Weights   12/20/22 2113  Weight: 96.9 kg   GENERAL:  72 y.o.-year-old patient with no acute distress.  LUNGS: decreased breath sounds bilaterally, no wheezing CARDIOVASCULAR: S1, S2 normal. No murmur   ABDOMEN: Soft, nontender, nondistended. Bowel sounds present.  EXTREMITIES amputation stump+ NEUROLOGIC: nonfocal  patient is alert and awake  Condition at discharge: fair  The results of significant diagnostics from this hospitalization (including imaging, microbiology, ancillary and  laboratory) are listed below for reference.   Imaging Studies: CT ABDOMEN PELVIS W CONTRAST  Result Date: 12/20/2022 CLINICAL DATA:  Abdominal pain, nausea, diarrhea EXAM: CT ABDOMEN AND PELVIS WITH CONTRAST TECHNIQUE: Multidetector CT imaging of the abdomen and pelvis was performed using the standard protocol following bolus administration of intravenous contrast. RADIATION DOSE REDUCTION: This exam was performed according to the departmental dose-optimization program which includes automated exposure control, adjustment of the mA and/or kV according to patient size and/or use of iterative reconstruction technique. CONTRAST:  OMNIPAQUE IOHEXOL 300 MG/ML  SOLN COMPARISON:  09/23/2022 FINDINGS: Lower chest: Small patchy densities are  seen in the posterior lower lung fields. There is mild ectasia of the bronchi. There is peribronchial thickening. Coronary artery calcifications are seen. Hepatobiliary: No focal abnormalities are seen in liver. Gallbladder is unremarkable. Pancreas: No focal abnormalities are seen. Spleen: Unremarkable. Adrenals/Urinary Tract: Adrenals are not enlarged. There is no hydronephrosis. There are no renal or ureteral stones. There is 2.5 cm smooth marginated exophytic fluid density lesion in the medial upper pole of left kidney suggesting renal cysts. Ureters are not dilated. Urinary bladder is unremarkable. Stomach/Bowel: Stomach is unremarkable. Proximal small bowel loops are not dilated. There is mild dilation of distal small bowel loops measuring up to 2.6 cm in diameter. Appendix is not dilated. There is no significant wall thickening in colon. There is no pericolic stranding. Vascular/Lymphatic: Scattered arterial calcifications are seen. Scattered atherosclerotic plaques are seen. Reproductive: Unremarkable. Other: There is no ascites or pneumoperitoneum. Umbilical hernia containing fat is seen. Right inguinal hernia containing fat is seen. Musculoskeletal: No acute findings  are seen. IMPRESSION: There is no evidence of intestinal obstruction or pneumoperitoneum. Appendix is not dilated. There is no hydronephrosis. There is mild dilation of distal small bowel loops, possibly suggesting ileus. Coronary artery calcifications are seen.  2.5 cm left renal cyst. Small patchy infiltrates are seen in posterior lower lung fields suggesting scarring or atelectasis/pneumonia. Other findings as described in the body of the report. Electronically Signed   By: Ernie Avena M.D.   On: 12/20/2022 20:31   DG Chest 2 View  Result Date: 12/20/2022 CLINICAL DATA:  COPD.  Shortness of breath for 5 months. EXAM: CHEST - 2 VIEW COMPARISON:  Radiographs 12/13/2022 and 09/23/2022. CT chest 05/31/2022. FINDINGS: The heart size and mediastinal contours are stable with aortic atherosclerosis. There is severe, upper lobe predominant emphysema with chronic scarring inferiorly in the right upper lobe. No superimposed edema, confluent airspace opacity, pneumothorax or significant pleural effusion. Mild thoracic spondylosis without evidence of acute osseous abnormality. IMPRESSION: Severe emphysema with chronic right upper lobe scarring. No acute cardiopulmonary process. Electronically Signed   By: Carey Bullocks M.D.   On: 12/20/2022 17:00   DG Ribs Unilateral W/Chest Left  Result Date: 12/13/2022 CLINICAL DATA:  Left rib pain after fall. EXAM: LEFT RIBS AND CHEST - 3+ VIEW COMPARISON:  None Available. FINDINGS: No fracture or other bone lesions are seen involving the ribs. There is no evidence of pneumothorax or pleural effusion. Both lungs are clear. Heart size and mediastinal contours are within normal limits. IMPRESSION: Negative. Electronically Signed   By: Lupita Raider M.D.   On: 12/13/2022 15:16    Microbiology: Results for orders placed or performed during the hospital encounter of 12/20/22  Blood culture (routine x 2)     Status: None (Preliminary result)   Collection Time: 12/20/22   8:58 PM   Specimen: BLOOD  Result Value Ref Range Status   Specimen Description   Final    BLOOD LEFT ANTECUBITAL Performed at [facility] [address], [place], [place] [address]    Special Requests   Final    BOTTLES DRAWN AEROBIC AND ANAEROBIC Blood Culture adequate volume Performed at [facility], [address] [place], [place] [address]    Culture  Setup Time   Final    GRAM POSITIVE COCCI AEROBIC BOTTLE ONLY CRITICAL RESULT CALLED TO, READ BACK BY AND VERIFIED WITH: NATHAN BELUE AT 0559 12/22/22.PMF    Culture GRAM POSITIVE COCCI  Final   Report Status PENDING  Incomplete  Blood Culture ID Panel (Reflexed)     Status: Abnormal   Collection Time: 12/20/22  8:58 PM  Result Value Ref Range Status   Enterococcus faecalis NOT DETECTED NOT DETECTED Final   Enterococcus Faecium NOT DETECTED NOT DETECTED Final   Listeria monocytogenes NOT DETECTED NOT DETECTED Final   Staphylococcus species DETECTED (A) NOT DETECTED Final    Comment: CRITICAL RESULT CALLED TO, READ BACK BY AND VERIFIED WITH: NATHAN BELUE AT 0559 12/22/22.PMF    Staphylococcus aureus (BCID) NOT DETECTED NOT DETECTED Final   Staphylococcus epidermidis NOT DETECTED NOT DETECTED Final   Staphylococcus lugdunensis NOT DETECTED NOT DETECTED Final   Streptococcus species NOT DETECTED NOT DETECTED Final   Streptococcus agalactiae NOT DETECTED NOT DETECTED Final   Streptococcus pneumoniae NOT DETECTED NOT DETECTED Final   Streptococcus pyogenes NOT DETECTED NOT DETECTED Final   A.calcoaceticus-baumannii NOT DETECTED NOT DETECTED Final   Bacteroides fragilis NOT DETECTED NOT DETECTED Final   Enterobacterales NOT DETECTED NOT DETECTED Final   Enterobacter cloacae complex NOT DETECTED NOT DETECTED Final   Escherichia coli NOT DETECTED NOT DETECTED Final   Klebsiella aerogenes NOT DETECTED NOT DETECTED Final   Klebsiella oxytoca NOT DETECTED NOT DETECTED Final   Klebsiella pneumoniae NOT DETECTED  NOT DETECTED Final   Proteus species NOT DETECTED NOT DETECTED Final   Salmonella species NOT DETECTED NOT DETECTED Final   Serratia marcescens NOT DETECTED NOT DETECTED Final   Haemophilus influenzae NOT DETECTED NOT DETECTED Final   Neisseria meningitidis NOT DETECTED NOT DETECTED Final   Pseudomonas aeruginosa NOT DETECTED NOT DETECTED Final   Stenotrophomonas maltophilia NOT DETECTED NOT DETECTED Final   Candida albicans NOT DETECTED NOT DETECTED Final   Candida auris NOT DETECTED NOT DETECTED Final   Candida glabrata NOT DETECTED NOT DETECTED Final   Candida krusei NOT DETECTED NOT DETECTED Final   Candida parapsilosis NOT DETECTED NOT DETECTED Final   Candida tropicalis NOT DETECTED NOT DETECTED Final   Cryptococcus neoformans/gattii NOT DETECTED NOT DETECTED Final    Comment: Performed at [facility], [address] [place], [place] [address]  Blood culture (routine x 2)     Status: None (Preliminary result)   Collection Time: 12/20/22  9:11 PM   Specimen: BLOOD  Result Value Ref Range Status   Specimen Description BLOOD RIGHT ANTECUBITAL  Final   Special Requests   Final    BOTTLES DRAWN AEROBIC AND ANAEROBIC Blood Culture adequate volume   Culture   Final    NO GROWTH 2 DAYS Performed at [facility], [address] [place], [place] [address]    Report Status PENDING  Incomplete  Respiratory (~20 pathogens) panel by PCR     Status: None   Collection Time: 12/20/22  9:47 PM   Specimen: Nasopharyngeal Swab; Respiratory  Result Value Ref Range Status   Adenovirus NOT DETECTED NOT DETECTED Final   Coronavirus 229E NOT DETECTED NOT DETECTED Final    Comment: (NOTE) The Coronavirus on the Respiratory Panel, DOES NOT test for the novel  Coronavirus (2019 nCoV)    Coronavirus HKU1 NOT DETECTED NOT DETECTED Final   Coronavirus NL63 NOT DETECTED NOT DETECTED Final   Coronavirus OC43 NOT DETECTED NOT DETECTED Final   Metapneumovirus NOT DETECTED NOT  DETECTED Final   Rhinovirus / Enterovirus NOT DETECTED NOT DETECTED Final   Influenza A NOT DETECTED NOT DETECTED Final   Influenza B NOT DETECTED NOT DETECTED Final   Parainfluenza Virus 1 NOT DETECTED NOT DETECTED Final  Parainfluenza Virus 2 NOT DETECTED NOT DETECTED Final   Parainfluenza Virus 3 NOT DETECTED NOT DETECTED Final   Parainfluenza Virus 4 NOT DETECTED NOT DETECTED Final   Respiratory Syncytial Virus NOT DETECTED NOT DETECTED Final   Bordetella pertussis NOT DETECTED NOT DETECTED Final   Bordetella Parapertussis NOT DETECTED NOT DETECTED Final   Chlamydophila pneumoniae NOT DETECTED NOT DETECTED Final   Mycoplasma pneumoniae NOT DETECTED NOT DETECTED Final    Comment: Performed at [facility], [address], [place], [place] [address]  SARS Coronavirus 2 by RT PCR (hospital order, performed in [facility] hospital lab) *cepheid single result test* Nasopharyngeal Swab     Status: None   Collection Time: 12/20/22  9:47 PM   Specimen: Nasopharyngeal Swab; Nasal Swab  Result Value Ref Range Status   SARS Coronavirus 2 by RT PCR NEGATIVE NEGATIVE Final    Comment: (NOTE) SARS-CoV-2 target nucleic acids are NOT DETECTED.  The SARS-CoV-2 RNA is generally detectable in upper and lower respiratory specimens during the acute phase of infection. The lowest concentration of SARS-CoV-2 viral copies this assay can detect is 250 copies / mL. A negative result does not preclude SARS-CoV-2 infection and should not be used as the sole basis for treatment or other patient management decisions.  A negative result may occur with improper specimen collection / handling, submission of specimen other than nasopharyngeal swab, presence of viral mutation(s) within the areas targeted by this assay, and inadequate number of viral copies (<250 copies / mL). A negative result must be combined with clinical observations, patient history, and epidemiological information.  Fact Sheet for  Patients:   RoadLapTop.co.za  Fact Sheet for Healthcare Providers: http://kim-miller.com/  This test is not yet approved or  cleared by the [place] FDA and has been authorized for detection and/or diagnosis of SARS-CoV-2 by FDA under an Emergency Use Authorization (EUA).  This EUA will remain in effect (meaning this test can be used) for the duration of the COVID-19 declaration under Section 564(b)(1) of the Act, 21 U.S.C. section 360bbb-3(b)(1), unless the authorization is terminated or revoked sooner.  Performed at [facility], [address] [place], [place] [address]     Labs: CBC: Recent Labs  Lab 12/20/22 1539 12/21/22 0612  WBC 12.8* 10.9*  HGB 17.2* 15.9  HCT 54.9* 48.6  MCV 100.0 93.1  PLT 150 290   Basic Metabolic Panel: Recent Labs  Lab 12/20/22 1932 12/21/22 0612  NA 134* 137  K 4.0 3.7  CL 101 109  CO2 24 24  GLUCOSE 239* 156*  BUN 27* 27*  CREATININE 1.16 1.13  CALCIUM 9.1 9.2   Liver Function Tests: Recent Labs  Lab 12/21/22 0612  AST 18  ALT 28  ALKPHOS 80  BILITOT 0.5  PROT 6.9  ALBUMIN 3.5   CBG: Recent Labs  Lab 12/21/22 1650 12/21/22 2131 12/22/22 0020 12/22/22 0726 12/22/22 1139  GLUCAP 184* 375* 204* 178* 245*    Discharge time spent: greater than 30 minutes.  Signed: Enedina Finner, MD [facility] 12/22/2022

## 2022-12-22 NOTE — Progress Notes (Signed)
PHARMACY - PHYSICIAN COMMUNICATION CRITICAL VALUE ALERT - BLOOD CULTURE IDENTIFICATION (BCID)  Results for orders placed or performed during the hospital encounter of 12/20/22  Blood culture (routine x 2)     Status: None (Preliminary result)   Collection Time: 12/20/22  8:58 PM   Specimen: BLOOD  Result Value Ref Range Status   Specimen Description BLOOD LEFT ANTECUBITAL  Final   Special Requests   Final    BOTTLES DRAWN AEROBIC AND ANAEROBIC Blood Culture adequate volume   Culture  Setup Time   Final    GRAM POSITIVE COCCI AEROBIC BOTTLE ONLY CRITICAL RESULT CALLED TO, READ BACK BY AND VERIFIED WITH: Samayra Hebel AT 0559 12/22/22.PMF Organism ID to follow Performed at [facility], [address] [place], [place] [address]    Culture [facility] POSITIVE COCCI  Final   Report Status PENDING  Incomplete  Blood Culture ID Panel (Reflexed)     Status: Abnormal   Collection Time: 12/20/22  8:58 PM  Result Value Ref Range Status   Enterococcus faecalis NOT DETECTED NOT DETECTED Final   Enterococcus Faecium NOT DETECTED NOT DETECTED Final   Listeria monocytogenes NOT DETECTED NOT DETECTED Final   Staphylococcus species DETECTED (A) NOT DETECTED Final    Comment: CRITICAL RESULT CALLED TO, READ BACK BY AND VERIFIED WITH: Ramy Greth AT 0559 12/22/22.PMF    Staphylococcus aureus (BCID) NOT DETECTED NOT DETECTED Final   Staphylococcus epidermidis NOT DETECTED NOT DETECTED Final   Staphylococcus lugdunensis NOT DETECTED NOT DETECTED Final   Streptococcus species NOT DETECTED NOT DETECTED Final   Streptococcus agalactiae NOT DETECTED NOT DETECTED Final   Streptococcus pneumoniae NOT DETECTED NOT DETECTED Final   Streptococcus pyogenes NOT DETECTED NOT DETECTED Final   A.calcoaceticus-baumannii NOT DETECTED NOT DETECTED Final   Bacteroides fragilis NOT DETECTED NOT DETECTED Final   Enterobacterales NOT DETECTED NOT DETECTED Final   Enterobacter cloacae complex NOT DETECTED NOT  DETECTED Final   Escherichia coli NOT DETECTED NOT DETECTED Final   Klebsiella aerogenes NOT DETECTED NOT DETECTED Final   Klebsiella oxytoca NOT DETECTED NOT DETECTED Final   Klebsiella pneumoniae NOT DETECTED NOT DETECTED Final   Proteus species NOT DETECTED NOT DETECTED Final   Salmonella species NOT DETECTED NOT DETECTED Final   Serratia marcescens NOT DETECTED NOT DETECTED Final   Haemophilus influenzae NOT DETECTED NOT DETECTED Final   Neisseria meningitidis NOT DETECTED NOT DETECTED Final   Pseudomonas aeruginosa NOT DETECTED NOT DETECTED Final   Stenotrophomonas maltophilia NOT DETECTED NOT DETECTED Final   Candida albicans NOT DETECTED NOT DETECTED Final   Candida auris NOT DETECTED NOT DETECTED Final   Candida glabrata NOT DETECTED NOT DETECTED Final   Candida krusei NOT DETECTED NOT DETECTED Final   Candida parapsilosis NOT DETECTED NOT DETECTED Final   Candida tropicalis NOT DETECTED NOT DETECTED Final   Cryptococcus neoformans/gattii NOT DETECTED NOT DETECTED Final    Comment: Performed at [facility], [address] [place], [place] [address]  Blood culture (routine x 2)     Status: None (Preliminary result)   Collection Time: 12/20/22  9:11 PM   Specimen: BLOOD  Result Value Ref Range Status   Specimen Description BLOOD RIGHT ANTECUBITAL  Final   Special Requests   Final    BOTTLES DRAWN AEROBIC AND ANAEROBIC Blood Culture adequate volume   Culture   Final    NO GROWTH < 12 HOURS Performed at [facility], [address] [place], [place] [address]    Report  Status PENDING  Incomplete  Respiratory (~20 pathogens) panel by PCR     Status: None   Collection Time: 12/20/22  9:47 PM   Specimen: Nasopharyngeal Swab; Respiratory  Result Value Ref Range Status   Adenovirus NOT DETECTED NOT DETECTED Final   Coronavirus 229E NOT DETECTED NOT DETECTED Final    Comment: (NOTE) The Coronavirus on the Respiratory Panel, DOES NOT test for the novel   Coronavirus (2019 nCoV)    Coronavirus HKU1 NOT DETECTED NOT DETECTED Final   Coronavirus NL63 NOT DETECTED NOT DETECTED Final   Coronavirus OC43 NOT DETECTED NOT DETECTED Final   Metapneumovirus NOT DETECTED NOT DETECTED Final   Rhinovirus / Enterovirus NOT DETECTED NOT DETECTED Final   Influenza A NOT DETECTED NOT DETECTED Final   Influenza B NOT DETECTED NOT DETECTED Final   Parainfluenza Virus 1 NOT DETECTED NOT DETECTED Final   Parainfluenza Virus 2 NOT DETECTED NOT DETECTED Final   Parainfluenza Virus 3 NOT DETECTED NOT DETECTED Final   Parainfluenza Virus 4 NOT DETECTED NOT DETECTED Final   Respiratory Syncytial Virus NOT DETECTED NOT DETECTED Final   Bordetella pertussis NOT DETECTED NOT DETECTED Final   Bordetella Parapertussis NOT DETECTED NOT DETECTED Final   Chlamydophila pneumoniae NOT DETECTED NOT DETECTED Final   Mycoplasma pneumoniae NOT DETECTED NOT DETECTED Final    Comment: Performed at [facility], [address] [place], [place] [address]  SARS Coronavirus 2 by RT PCR (hospital order, performed in [facility] hospital lab) *cepheid single result test* Nasopharyngeal Swab     Status: None   Collection Time: 12/20/22  9:47 PM   Specimen: Nasopharyngeal Swab; Nasal Swab  Result Value Ref Range Status   SARS Coronavirus 2 by RT PCR NEGATIVE NEGATIVE Final    Comment: (NOTE) SARS-CoV-2 target nucleic acids are NOT DETECTED.  The SARS-CoV-2 RNA is generally detectable in upper and lower respiratory specimens during the acute phase of infection. The lowest concentration of SARS-CoV-2 viral copies this assay can detect is 250 copies / mL. A negative result does not preclude SARS-CoV-2 infection and should not be used as the sole basis for treatment or other patient management decisions.  A negative result may occur with improper specimen collection / handling, submission of specimen other than nasopharyngeal swab, presence of viral mutation(s) within  the areas targeted by this assay, and inadequate number of viral copies (<250 copies / mL). A negative result must be combined with clinical observations, patient history, and epidemiological information.  Fact Sheet for Patients:   RoadLapTop.co.za  Fact Sheet for Healthcare Providers: http://kim-miller.com/  This test is not yet approved or  cleared by the [place] FDA and has been authorized for detection and/or diagnosis of SARS-CoV-2 by FDA under an Emergency Use Authorization (EUA).  This EUA will remain in effect (meaning this test can be used) for the duration of the COVID-19 declaration under Section 564(b)(1) of the Act, 21 U.S.C. section 360bbb-3(b)(1), unless the authorization is terminated or revoked sooner.  Performed at [facility], [address] [place], [place] [address]     BCID Results: 1 (aerobic) of 4 bottles w/ Staph Species, no resistance. Pt currently on Unasyn.  WBC improving, afebrile.  Name of provider contacted: Cliffton Asters, NP   Changes to prescribed antibiotics required: No change at this time.  Otelia Sergeant, PharmD, [facility] 12/22/2022 6:17 AM

## 2022-12-23 DIAGNOSIS — D751 Secondary polycythemia: Secondary | ICD-10-CM | POA: Diagnosis not present

## 2022-12-23 DIAGNOSIS — J439 Emphysema, unspecified: Secondary | ICD-10-CM | POA: Diagnosis not present

## 2022-12-23 DIAGNOSIS — J9621 Acute and chronic respiratory failure with hypoxia: Secondary | ICD-10-CM | POA: Diagnosis not present

## 2022-12-23 DIAGNOSIS — J189 Pneumonia, unspecified organism: Secondary | ICD-10-CM | POA: Diagnosis not present

## 2022-12-23 LAB — CULTURE, BLOOD (ROUTINE X 2)

## 2022-12-23 LAB — GLUCOSE, CAPILLARY: Glucose-Capillary: 194 mg/dL — ABNORMAL HIGH (ref 70–99)

## 2022-12-23 MED ORDER — HYDRALAZINE HCL 20 MG/ML IJ SOLN: 10.0000 mg | Freq: Once | INTRAMUSCULAR | Status: DC | PRN

## 2022-12-23 NOTE — Plan of Care (Signed)
Adequate for discharge. Resolve plan of care  Danny [name] Danny [name]   

## 2022-12-23 NOTE — Progress Notes (Signed)
Discharge education completed. IV removed. Purewick removed. Patient dressed. Discharged to the care of EMS in stable condition.  Cornell Barman Electa Sterry

## 2022-12-23 NOTE — TOC Transition Note (Signed)
Transition of Care [facility]) - CM/SW Discharge Note   Patient Details  Name: Danny JOSHLIN Sr. MRN: [id number] Date of Birth: 03-21-1951  Transition of Care [facility]) CM/SW Contact:  Liliana Cline, LCSW Phone Number: 12/23/2022, 10:58 AM   Clinical Narrative:    Patient to DC home today. Per MD and RN, family will not transport but reports patient is safe to return home alone and can get into his home with EMS transport.  Patient is new o2. Called EMS, spoke to [place] who checked with his crew chief who stated they can take the oxygen tank from [employer] with patient on EMS truck to the home. EMS paperwork completed and patient is next on the list, RN aware.  Called Jasmine with [employer] who stated she can have someone go out today to finish patient's home o2 set up.  Called Elnita Maxwell with [facility] who confirmed they are still active with the patient and will see patient in the home, likely tomorrow.    Final next level of care: Home w Home Health Services Barriers to Discharge: Barriers Resolved   Patient Goals and CMS Choice CMS Medicare.gov Compare Post Acute Care list provided to:: Patient Choice offered to / list presented to : Patient  Discharge Placement                  Patient to be transferred to facility by: [facility]   Patient and family notified of of transfer: 12/23/22  Discharge Plan and Services Additional resources added to the After Visit Summary for                  DME Arranged: Oxygen DME Agency: [employer] Date DME Agency Contacted: 12/23/22   Representative spoke with at DME Agency: Leavy [facility] Arranged: PT, RN [facility] Agency: [employer] [facility] Date [facility] Agency Contacted: 12/23/22   Representative spoke with at [facility] Agency: Elnita Maxwell  Social Determinants of Health (SDOH) Interventions SDOH Screenings   Food Insecurity: No Food Insecurity (12/20/2022)  Housing: Low Risk  (12/20/2022)  Transportation Needs: No Transportation Needs (12/20/2022)  Utilities:  Not At Risk (12/20/2022)  Depression (PHQ2-9): Low Risk  (10/31/2022)  Financial Resource Strain: Low Risk  (12/12/2021)  Physical Activity: Unknown (12/12/2021)  Social Connections: Unknown (12/12/2021)  Stress: No Stress Concern Present (12/12/2021)  Tobacco Use: Medium Risk (12/21/2022)     Readmission Risk Interventions     No data to display

## 2022-12-23 NOTE — Discharge Summary (Signed)
Physician Discharge Summary   Patient: Danny SUGIMOTO Sr. MRN: [id number] DOB: 1951/08/06  Admit date:     12/20/2022  Discharge date: 12/23/22  Discharge Physician: Enedina Finner   PCP: [name], MD   Recommendations at discharge:   use your oxygen, inhaler, nebulizer. PCP in 1 to 2 weeks  Discharge Diagnoses: Principal Problem:   Pneumonia due to infectious organism Active Problems:   Pulmonary emphysema (HCC)   Hypoxia   CAP (community acquired pneumonia)   Erythrocytosis   Acute on chronic respiratory failure with hypoxia (HCC)  Danny Friends Sheaffer Sr. is a 72 y.o. male with medical history significant of COPD, not on oxygen, CAD, hypertension, diabetes comes in the emergency room with increasing shortness of breath for last few days. Patient was found to be hypoxic 88 to 90% on room air. Denies any history of smoking. Has some cough. Patient also had with episodes of vomiting for couple days per patient's son [employer]. Denies any diarrhea or constipation. Denies abdominal pain. Denies any fever. Patient was admitted with acute hypoxic respiratory failure secondary to COPD with possible pneumonia.   Acute hypoxic respiratory failure secondary to COPD exacerbation and pneumonia in the setting of vomiting. -- Continue IV unasyn--change to po abxs -- patient able to tolerated PO diet. Received IV fluids. -- Not wheezing. Continue nebulizer/inhalers -- patient qualifies for home oxygen. He is wheelchair-bound  De- sats on minimal activity. -- CT chest evident with patchy pneumonia bibasilar. -- White count improving. No fever. Feels at baseline today --home oxygen has been set up   Emphysema/COPD with history of tobacco in the past -- patient will need oxygen to go homw   Hypertension -- resume home meds   Type II diabetes, hyperglycemia -- sliding scale insulin -- patient uses trulicity and Jardiance at home (per med rec)   History of amputation. Patient is  wheelchair-bound. -- He is able to get around the house do his ADLs . Patient has home health. --He is also followed by Child psychotherapist.   Overall improving D/c home with oxygen Pt will d/c via EMS. Per RN Dter Danny [name] is informed and ok with the plan. Unable to reach Danny [name] (son) Pt agreeable with the plan and eager for discharge. Will resume previous HH service  Unable to leave VM for Son Danny [name] today      Diet recommendation:  Discharge Diet Orders (From admission, onward)     Start     Ordered   12/22/22 0000  Diet - low sodium heart healthy        12/22/22 1143            DISCHARGE MEDICATION: Allergies as of 12/23/2022       Reactions   Metformin And Related Diarrhea   If he takes it twice a day it causes diarhea, so he reduced to once daily        Medication List     TAKE these medications    acetaminophen 325 MG tablet Commonly known as: TYLENOL Take 2 tablets (650 mg total) by mouth every 4 (four) hours as needed for mild pain (or temp > 37.5 C (99.5 F)).   albuterol (2.5 MG/3ML) 0.083% nebulizer solution Commonly known as: PROVENTIL Take 3 mLs (2.5 mg total) by nebulization every 6 (six) hours as needed for wheezing or shortness of breath.   ProAir RespiClick 108 (90 Base) MCG/ACT Aepb Generic drug: Albuterol Sulfate Inhale 1 puff into the lungs every 6 (six) hours  as needed.   amLODipine 10 MG tablet Commonly known as: NORVASC Take 1 tablet (10 mg total) by mouth daily.   amoxicillin-clavulanate 875-125 MG tablet Commonly known as: AUGMENTIN Take 1 tablet by mouth every 12 (twelve) hours for 5 days.   aspirin EC 81 MG tablet Take 1 tablet (81 mg total) by mouth daily.   atorvastatin 80 MG tablet Commonly known as: LIPITOR Take 1 tablet (80 mg total) by mouth daily.   carvedilol 25 MG tablet Commonly known as: COREG Take 1 tablet (25 mg total) by mouth 2 (two) times daily with a meal.   clopidogrel 75 MG tablet Commonly known as:  Plavix Take 1 tablet (75 mg total) by mouth daily.   empagliflozin 25 MG Tabs tablet Commonly known as: Jardiance Take 1 tablet (25 mg total) by mouth daily.   escitalopram 10 MG tablet Commonly known as: LEXAPRO Take 1 tablet (10 mg total) by mouth daily.   FreeStyle Libre 2 Reader Devi 1 Device by Does not apply route 4 (four) times daily.   FreeStyle Libre 2 Sensor Misc Check blood sugars   nitroGLYCERIN 0.4 MG SL tablet Commonly known as: NITROSTAT Place 0.4 mg under the tongue every 5 (five) minutes as needed for chest pain.   spironolactone 25 MG tablet Commonly known as: ALDACTONE TAKE 1 TABLET (25 MG TOTAL) BY MOUTH DAILY.   tamsulosin 0.4 MG Caps capsule Commonly known as: FLOMAX TAKE 1 CAPSULE BY MOUTH EVERY DAY   Trulicity 4.5 MG/0.5ML Sopn Generic drug: Dulaglutide Inject 4.5 mg into the skin once a week.               Durable Medical Equipment  (From admission, onward)           Start     Ordered   12/21/22 1411  For home use only DME oxygen  Once       Question Answer Comment  Length of Need 6 Months   Mode or (Route) Nasal cannula   Liters per Minute 2   Frequency Continuous (stationary and portable oxygen unit needed)   Oxygen conserving device Yes   Oxygen delivery system Gas      12/21/22 1410            Follow-up Information     [name], MD. Schedule an appointment as soon as possible for a visit in 1 week(s).   Specialty: Family Medicine Why: hospital f/u Contact information: [address] [place] [address] [phone]                Discharge Exam: Danny [name] Weights   12/20/22 2113  Weight: 96.9 kg   GENERAL:  72 y.o.-year-old patient with no acute distress.  LUNGS: decreased breath sounds bilaterally, no wheezing CARDIOVASCULAR: S1, S2 normal. No murmur   ABDOMEN: Soft, nontender, nondistended. Bowel sounds present.  EXTREMITIES amputation stump+ NEUROLOGIC: nonfocal  patient is  alert and awake  Condition at discharge: fair  The results of significant diagnostics from this hospitalization (including imaging, microbiology, ancillary and laboratory) are listed below for reference.   Imaging Studies: CT ABDOMEN PELVIS W CONTRAST  Result Date: 12/20/2022 CLINICAL DATA:  Abdominal pain, nausea, diarrhea EXAM: CT ABDOMEN AND PELVIS WITH CONTRAST TECHNIQUE: Multidetector CT imaging of the abdomen and pelvis was performed using the standard protocol following bolus administration of intravenous contrast. RADIATION DOSE REDUCTION: This exam was performed according to the departmental dose-optimization program which includes automated exposure control, adjustment of the mA  and/or kV according to patient size and/or use of iterative reconstruction technique. CONTRAST:  OMNIPAQUE IOHEXOL 300 MG/ML  SOLN COMPARISON:  09/23/2022 FINDINGS: Lower chest: Small patchy densities are seen in the posterior lower lung fields. There is mild ectasia of the bronchi. There is peribronchial thickening. Coronary artery calcifications are seen. Hepatobiliary: No focal abnormalities are seen in liver. Gallbladder is unremarkable. Pancreas: No focal abnormalities are seen. Spleen: Unremarkable. Adrenals/Urinary Tract: Adrenals are not enlarged. There is no hydronephrosis. There are no renal or ureteral stones. There is 2.5 cm smooth marginated exophytic fluid density lesion in the medial upper pole of left kidney suggesting renal cysts. Ureters are not dilated. Urinary bladder is unremarkable. Stomach/Bowel: Stomach is unremarkable. Proximal small bowel loops are not dilated. There is mild dilation of distal small bowel loops measuring up to 2.6 cm in diameter. Appendix is not dilated. There is no significant wall thickening in colon. There is no pericolic stranding. Vascular/Lymphatic: Scattered arterial calcifications are seen. Scattered atherosclerotic plaques are seen. Reproductive: Unremarkable.  Other: There is no ascites or pneumoperitoneum. Umbilical hernia containing fat is seen. Right inguinal hernia containing fat is seen. Musculoskeletal: No acute findings are seen. IMPRESSION: There is no evidence of intestinal obstruction or pneumoperitoneum. Appendix is not dilated. There is no hydronephrosis. There is mild dilation of distal small bowel loops, possibly suggesting ileus. Coronary artery calcifications are seen.  2.5 cm left renal cyst. Small patchy infiltrates are seen in posterior lower lung fields suggesting scarring or atelectasis/pneumonia. Other findings as described in the body of the report. Electronically Signed   By: Ernie Avena M.D.   On: 12/20/2022 20:31   DG Chest 2 View  Result Date: 12/20/2022 CLINICAL DATA:  COPD.  Shortness of breath for 5 months. EXAM: CHEST - 2 VIEW COMPARISON:  Radiographs 12/13/2022 and 09/23/2022. CT chest 05/31/2022. FINDINGS: The heart size and mediastinal contours are stable with aortic atherosclerosis. There is severe, upper lobe predominant emphysema with chronic scarring inferiorly in the right upper lobe. No superimposed edema, confluent airspace opacity, pneumothorax or significant pleural effusion. Mild thoracic spondylosis without evidence of acute osseous abnormality. IMPRESSION: Severe emphysema with chronic right upper lobe scarring. No acute cardiopulmonary process. Electronically Signed   By: Carey Bullocks M.D.   On: 12/20/2022 17:00   DG Ribs Unilateral W/Chest Left  Result Date: 12/13/2022 CLINICAL DATA:  Left rib pain after fall. EXAM: LEFT RIBS AND CHEST - 3+ VIEW COMPARISON:  None Available. FINDINGS: No fracture or other bone lesions are seen involving the ribs. There is no evidence of pneumothorax or pleural effusion. Both lungs are clear. Heart size and mediastinal contours are within normal limits. IMPRESSION: Negative. Electronically Signed   By: Lupita Raider M.D.   On: 12/13/2022 15:16    Microbiology: Results  for orders placed or performed during the hospital encounter of 12/20/22  Blood culture (routine x 2)     Status: Abnormal   Collection Time: 12/20/22  8:58 PM   Specimen: BLOOD  Result Value Ref Range Status   Specimen Description   Final    BLOOD LEFT ANTECUBITAL Performed at [facility], [address], [place], [place] [address]    Special Requests   Final    BOTTLES DRAWN AEROBIC AND ANAEROBIC Blood Culture adequate volume Performed at [facility], [address] [place], [place] [address]    Culture  Setup Time   Final    GRAM POSITIVE COCCI AEROBIC BOTTLE ONLY CRITICAL RESULT CALLED  TO, READ BACK BY AND VERIFIED WITH: NATHAN BELUE AT 0559 12/22/22.PMF    Culture (A)  Final    STAPHYLOCOCCUS CAPITIS THE SIGNIFICANCE OF ISOLATING THIS ORGANISM FROM A SINGLE SET OF BLOOD CULTURES WHEN MULTIPLE SETS ARE DRAWN IS UNCERTAIN. PLEASE NOTIFY THE MICROBIOLOGY DEPARTMENT WITHIN ONE WEEK IF SPECIATION AND SENSITIVITIES ARE REQUIRED. Performed at [facility], [address] [place], [place] [address]    Report Status 12/23/2022 FINAL  Final  Blood Culture ID Panel (Reflexed)     Status: Abnormal   Collection Time: 12/20/22  8:58 PM  Result Value Ref Range Status   Enterococcus faecalis NOT DETECTED NOT DETECTED Final   Enterococcus Faecium NOT DETECTED NOT DETECTED Final   Listeria monocytogenes NOT DETECTED NOT DETECTED Final   Staphylococcus species DETECTED (A) NOT DETECTED Final    Comment: CRITICAL RESULT CALLED TO, READ BACK BY AND VERIFIED WITH: NATHAN BELUE AT 0559 12/22/22.PMF    Staphylococcus aureus (BCID) NOT DETECTED NOT DETECTED Final   Staphylococcus epidermidis NOT DETECTED NOT DETECTED Final   Staphylococcus lugdunensis NOT DETECTED NOT DETECTED Final   Streptococcus species NOT DETECTED NOT DETECTED Final   Streptococcus agalactiae NOT DETECTED NOT DETECTED Final   Streptococcus pneumoniae NOT DETECTED NOT DETECTED Final   Streptococcus  pyogenes NOT DETECTED NOT DETECTED Final   A.calcoaceticus-baumannii NOT DETECTED NOT DETECTED Final   Bacteroides fragilis NOT DETECTED NOT DETECTED Final   Enterobacterales NOT DETECTED NOT DETECTED Final   Enterobacter cloacae complex NOT DETECTED NOT DETECTED Final   Escherichia coli NOT DETECTED NOT DETECTED Final   Klebsiella aerogenes NOT DETECTED NOT DETECTED Final   Klebsiella oxytoca NOT DETECTED NOT DETECTED Final   Klebsiella pneumoniae NOT DETECTED NOT DETECTED Final   Proteus species NOT DETECTED NOT DETECTED Final   Salmonella species NOT DETECTED NOT DETECTED Final   Serratia marcescens NOT DETECTED NOT DETECTED Final   Haemophilus influenzae NOT DETECTED NOT DETECTED Final   Neisseria meningitidis NOT DETECTED NOT DETECTED Final   Pseudomonas aeruginosa NOT DETECTED NOT DETECTED Final   Stenotrophomonas maltophilia NOT DETECTED NOT DETECTED Final   Candida albicans NOT DETECTED NOT DETECTED Final   Candida auris NOT DETECTED NOT DETECTED Final   Candida glabrata NOT DETECTED NOT DETECTED Final   Candida krusei NOT DETECTED NOT DETECTED Final   Candida parapsilosis NOT DETECTED NOT DETECTED Final   Candida tropicalis NOT DETECTED NOT DETECTED Final   Cryptococcus neoformans/gattii NOT DETECTED NOT DETECTED Final    Comment: Performed at [facility], [address] [place], [place] [address]  Blood culture (routine x 2)     Status: None (Preliminary result)   Collection Time: 12/20/22  9:11 PM   Specimen: BLOOD  Result Value Ref Range Status   Specimen Description BLOOD RIGHT ANTECUBITAL  Final   Special Requests   Final    BOTTLES DRAWN AEROBIC AND ANAEROBIC Blood Culture adequate volume   Culture   Final    NO GROWTH 3 DAYS Performed at [facility], [address] [place], [place] [address]    Report Status PENDING  Incomplete  Respiratory (~20 pathogens) panel by PCR     Status: None   Collection Time: 12/20/22  9:47 PM   Specimen:  Nasopharyngeal Swab; Respiratory  Result Value Ref Range Status   Adenovirus NOT DETECTED NOT DETECTED Final   Coronavirus 229E NOT DETECTED NOT DETECTED Final    Comment: (NOTE) The Coronavirus on the Respiratory Panel, DOES NOT test for the  novel  Coronavirus (2019 nCoV)    Coronavirus HKU1 NOT DETECTED NOT DETECTED Final   Coronavirus NL63 NOT DETECTED NOT DETECTED Final   Coronavirus OC43 NOT DETECTED NOT DETECTED Final   Metapneumovirus NOT DETECTED NOT DETECTED Final   Rhinovirus / Enterovirus NOT DETECTED NOT DETECTED Final   Influenza A NOT DETECTED NOT DETECTED Final   Influenza B NOT DETECTED NOT DETECTED Final   Parainfluenza Virus 1 NOT DETECTED NOT DETECTED Final   Parainfluenza Virus 2 NOT DETECTED NOT DETECTED Final   Parainfluenza Virus 3 NOT DETECTED NOT DETECTED Final   Parainfluenza Virus 4 NOT DETECTED NOT DETECTED Final   Respiratory Syncytial Virus NOT DETECTED NOT DETECTED Final   Bordetella pertussis NOT DETECTED NOT DETECTED Final   Bordetella Parapertussis NOT DETECTED NOT DETECTED Final   Chlamydophila pneumoniae NOT DETECTED NOT DETECTED Final   Mycoplasma pneumoniae NOT DETECTED NOT DETECTED Final    Comment: Performed at [facility], [address], [place], [place] [address]  SARS Coronavirus 2 by RT PCR (hospital order, performed in [place] hospital lab) *cepheid single result test* Nasopharyngeal Swab     Status: None   Collection Time: 12/20/22  9:47 PM   Specimen: Nasopharyngeal Swab; Nasal Swab  Result Value Ref Range Status   SARS Coronavirus 2 by RT PCR NEGATIVE NEGATIVE Final    Comment: (NOTE) SARS-CoV-2 target nucleic acids are NOT DETECTED.  The SARS-CoV-2 RNA is generally detectable in upper and lower respiratory specimens during the acute phase of infection. The lowest concentration of SARS-CoV-2 viral copies this assay can detect is 250 copies / mL. A negative result does not preclude SARS-CoV-2 infection and should not be  used as the sole basis for treatment or other patient management decisions.  A negative result may occur with improper specimen collection / handling, submission of specimen other than nasopharyngeal swab, presence of viral mutation(s) within the areas targeted by this assay, and inadequate number of viral copies (<250 copies / mL). A negative result must be combined with clinical observations, patient history, and epidemiological information.  Fact Sheet for Patients:   RoadLapTop.co.za  Fact Sheet for Healthcare Providers: http://kim-miller.com/  This test is not yet approved or  cleared by the [place] FDA and has been authorized for detection and/or diagnosis of SARS-CoV-2 by FDA under an Emergency Use Authorization (EUA).  This EUA will remain in effect (meaning this test can be used) for the duration of the COVID-19 declaration under Section 564(b)(1) of the Act, 21 U.S.C. section 360bbb-3(b)(1), unless the authorization is terminated or revoked sooner.  Performed at [facility], [address] [place], [place] [address]     Labs: CBC: Recent Labs  Lab 12/20/22 1539 12/21/22 0612  WBC 12.8* 10.9*  HGB 17.2* 15.9  HCT 54.9* 48.6  MCV 100.0 93.1  PLT 150 290    Basic Metabolic Panel: Recent Labs  Lab 12/20/22 1932 12/21/22 0612  NA 134* 137  K 4.0 3.7  CL 101 109  CO2 24 24  GLUCOSE 239* 156*  BUN 27* 27*  CREATININE 1.16 1.13  CALCIUM 9.1 9.2    Liver Function Tests: Recent Labs  Lab 12/21/22 0612  AST 18  ALT 28  ALKPHOS 80  BILITOT 0.5  PROT 6.9  ALBUMIN 3.5    CBG: Recent Labs  Lab 12/22/22 0726 12/22/22 1139 12/22/22 1721 12/22/22 2109 12/23/22 0905  GLUCAP 178* 245* 267* 333* 194*     Discharge time spent: greater than  30 minutes.  Signed: Enedina Finner, MD [facility] 12/23/2022

## 2022-12-23 NOTE — Progress Notes (Signed)
Spoke with Arline Asp, daughter, stated it is safe to discharge patient to the care of EMS for transport to his home and he does have capability to enter his home independently.  Unable to reach son.  Cornell Barman Amaranta Mehl

## 2022-12-24 ENCOUNTER — Telehealth: Payer: Self-pay | Admitting: *Deleted

## 2022-12-24 LAB — CULTURE, BLOOD (ROUTINE X 2): Culture: NO GROWTH

## 2022-12-24 NOTE — Transitions of Care (Post Inpatient/ED Visit) (Signed)
   12/24/2022  Name: Danny Coniglio Cerniglia Sr. MRN: [id number] DOB: 25-May-1951  Today's TOC FU Call Status: Today's TOC FU Call Status:: Unsuccessul Call (1st Attempt) Unsuccessful Call (1st Attempt) Date: 12/24/22  Attempted to reach the patient regarding the most recent Inpatient/ED visit.  Follow Up Plan: Additional outreach attempts will be made to reach the patient to complete the Transitions of Care (Post Inpatient/ED visit) call.   Gean Maidens BSN RN [facility] [phone]

## 2022-12-25 DIAGNOSIS — F419 Anxiety disorder, unspecified: Secondary | ICD-10-CM

## 2022-12-25 DIAGNOSIS — E785 Hyperlipidemia, unspecified: Secondary | ICD-10-CM | POA: Diagnosis not present

## 2022-12-25 DIAGNOSIS — Z7985 Long-term (current) use of injectable non-insulin antidiabetic drugs: Secondary | ICD-10-CM | POA: Diagnosis not present

## 2022-12-25 DIAGNOSIS — I5032 Chronic diastolic (congestive) heart failure: Secondary | ICD-10-CM | POA: Diagnosis not present

## 2022-12-25 DIAGNOSIS — I110 Hypertensive heart disease with heart failure: Secondary | ICD-10-CM | POA: Diagnosis not present

## 2022-12-25 DIAGNOSIS — Z89512 Acquired absence of left leg below knee: Secondary | ICD-10-CM | POA: Diagnosis not present

## 2022-12-25 DIAGNOSIS — Z7902 Long term (current) use of antithrombotics/antiplatelets: Secondary | ICD-10-CM | POA: Diagnosis not present

## 2022-12-25 DIAGNOSIS — Z9981 Dependence on supplemental oxygen: Secondary | ICD-10-CM

## 2022-12-25 DIAGNOSIS — Z9181 History of falling: Secondary | ICD-10-CM

## 2022-12-25 DIAGNOSIS — J449 Chronic obstructive pulmonary disease, unspecified: Secondary | ICD-10-CM | POA: Diagnosis not present

## 2022-12-25 DIAGNOSIS — I25118 Atherosclerotic heart disease of native coronary artery with other forms of angina pectoris: Secondary | ICD-10-CM | POA: Diagnosis not present

## 2022-12-25 DIAGNOSIS — E1151 Type 2 diabetes mellitus with diabetic peripheral angiopathy without gangrene: Secondary | ICD-10-CM | POA: Diagnosis not present

## 2022-12-25 DIAGNOSIS — Z87891 Personal history of nicotine dependence: Secondary | ICD-10-CM

## 2022-12-25 DIAGNOSIS — Z993 Dependence on wheelchair: Secondary | ICD-10-CM

## 2022-12-25 DIAGNOSIS — Z91148 Patient's other noncompliance with medication regimen for other reason: Secondary | ICD-10-CM | POA: Diagnosis not present

## 2022-12-25 DIAGNOSIS — F32A Depression, unspecified: Secondary | ICD-10-CM | POA: Diagnosis not present

## 2022-12-25 DIAGNOSIS — J441 Chronic obstructive pulmonary disease with (acute) exacerbation: Secondary | ICD-10-CM

## 2023-01-01 ENCOUNTER — Telehealth: Payer: Self-pay

## 2023-01-01 ENCOUNTER — Ambulatory Visit: Payer: 59 | Admitting: Family Medicine

## 2023-01-01 NOTE — Telephone Encounter (Signed)
Called Patient due to him missing his ED follow up appointment to check on him. Patient states he feels good now and he is in [place] visiting his son. Patient states he fell last night but he is okay and was checked out by a fireman. Patient states he is much better than he was on 12/20/22 when he went to the hospital for Pneumonia. Patient understands and is agreeable that if he needs [place] he will call and leave a message.

## 2023-01-02 ENCOUNTER — Telehealth: Payer: Self-pay | Admitting: *Deleted

## 2023-01-02 NOTE — Transitions of Care (Post Inpatient/ED Visit) (Signed)
   01/02/2023  Name: Danny Theesfeld Chandran Sr. MRN: [id number] DOB: Jan 15, 1951  Today's TOC FU Call Status: Today's TOC FU Call Status:: Unsuccessful Call (2nd Attempt) Unsuccessful Call (2nd Attempt) Date: 01/02/23  Attempted to reach the patient regarding the most recent Inpatient/ED visit.  Follow Up Plan: Additional outreach attempts will be made to reach the patient to complete the Transitions of Care (Post Inpatient/ED visit) call.   Gean Maidens BSN RN [facility] [phone]

## 2023-01-03 ENCOUNTER — Telehealth: Payer: Self-pay | Admitting: *Deleted

## 2023-01-03 NOTE — Transitions of Care (Post Inpatient/ED Visit) (Signed)
   01/03/2023  Name: Danny Finkel Lindbloom Sr. MRN: [id number] DOB: [birth year]  Today's TOC FU Call Status: Today's TOC FU Call Status:: Unsuccessful Call (3rd Attempt) Unsuccessful Call (3rd Attempt) Date: 01/03/23  Attempted to reach the patient regarding the most recent Inpatient/ED visit.  Follow Up Plan: No further outreach attempts will be made at this time. We have been unable to contact the patient.  Gean Maidens BSN RN [facility] [phone]

## 2023-01-30 ENCOUNTER — Telehealth: Payer: Self-pay | Admitting: *Deleted

## 2023-01-30 NOTE — Progress Notes (Signed)
  Care Coordination   Note   01/30/2023 Name: Danny Janoski Hing Sr. MRN: [id number] DOB: Mar 17, 1951  Danny Friends Altergott Sr. is a 72 y.o. year old male who sees [name], Yehuda Mao, MD for primary care. I reached out to [employer] Sr. by phone today to offer care coordination services.  [name] was given information about Care Coordination services today including:   The Care Coordination services include support from the care team which includes your Nurse Coordinator, Clinical Social Worker, or Pharmacist.  The Care Coordination team is here to help remove barriers to the health concerns and goals most important to you. Care Coordination services are voluntary, and the patient may decline or stop services at any time by request to their care team member.   Care Coordination Consent Status: Patient agreed to services and verbal consent obtained.   Follow up plan:  Telephone appointment with care coordination team member scheduled for:  02/12/2023  Encounter Outcome:  Pt. Scheduled  [name], CCMA Care Coordination Care Guide Direct Dial: [phone]

## 2023-02-01 ENCOUNTER — Other Ambulatory Visit: Payer: Medicaid Other

## 2023-02-02 ENCOUNTER — Other Ambulatory Visit: Payer: Self-pay | Admitting: Family

## 2023-02-02 DIAGNOSIS — I5032 Chronic diastolic (congestive) heart failure: Secondary | ICD-10-CM

## 2023-02-04 ENCOUNTER — Telehealth: Payer: Self-pay | Admitting: Family Medicine

## 2023-02-04 DIAGNOSIS — R531 Weakness: Secondary | ICD-10-CM | POA: Diagnosis not present

## 2023-02-04 NOTE — Telephone Encounter (Signed)
Pt care giver return call. Pt its book for 02/06/23 for his awv.

## 2023-02-04 NOTE — Telephone Encounter (Signed)
Copied from CRM [id number]. Topic: Medicare AWV >> Feb 04, 2023 11:35 AM Payton Doughty wrote: Reason for CRM: LM 02/04/2023 to schedule AWV   Verlee Rossetti; Care Guide Ambulatory Clinical Support [place] [facility] Direct Dial: [phone]

## 2023-02-12 ENCOUNTER — Ambulatory Visit: Payer: Self-pay

## 2023-02-14 ENCOUNTER — Telehealth: Payer: Self-pay

## 2023-02-14 NOTE — Patient Outreach (Signed)
  Care Coordination   Initial Visit Note   02/15/2023 Late entry 02/12/23 Name: Danny Hollie Haddaway Sr. MRN: [id number] DOB: July 18, 1951  Danny Friends Pitner Sr. is a 72 y.o. year old male who sees [name], Yehuda Mao, MD for primary care. I spoke with  Danny Friends Yonts Sr. And son Danny [name]. by phone today.  Danny Edin Sr. Gave verbal authorization to speak with son Danny [name]. About personal health information.  What matters to the patients health and wellness today?  Patient states he continues to have mild shortness of breath. He reports having home health nursing and physical therapy services for a short period of time then services ended. He states he is unsure why.  Son states patient had several cancellations with home health services.  Patient states his wheelchair is broken.  He reports rubber to the wheel on his walker has fallen off.  He states his wheelchair lock is broken.  Patient states he is unable to bath well due to safety concern of broken wheelchair.  Son states he attempts to help patient as much as he can but states it is difficult due to his own health issues.  Son states he is concerned that patient is not taking his medications correctly.      Goals Addressed             This Visit's Progress    continued improvement post hospitalization and obtain community resource needs.       Interventions Today    Flowsheet Row Most Recent Value  Chronic Disease   Chronic disease during today's visit Chronic Obstructive Pulmonary Disease (COPD), Other  [status post pneumonia]  General Interventions   General Interventions Discussed/Reviewed General Interventions Discussed, [employer], Doctor Visits, Horticulturist, commercial (DME)  [evaluation of treatment plan for COPD/ status post pneumonia and patients adherence to plan as established by provider.  Assessed for COPD/ respiratory symptoms. Referral to social worker for personal care assistance]  Doctor Visits  Discussed/Reviewed Doctor Visits Discussed  [Provider office called to set up appointment with PCP. Assessed for home health service]  Durable Medical Equipment (DME) Wheelchair  Wheelchair Standard  Education Interventions   Education Provided Provided Education, Provided Printed Education  Pharmacy Interventions   Pharmacy Dicussed/Reviewed Pharmacy Topics Discussed  [medications reviewed and compliance discussed]  Referral to Pharmacist --  [referred to pharmacist for medication management]  Safety Interventions   Safety Discussed/Reviewed Fall Risk  [fall precautions discussed]              SDOH assessments and interventions completed:  Yes  SDOH Interventions Today    Flowsheet Row Most Recent Value  SDOH Interventions   Food Insecurity Interventions Intervention Not Indicated  Housing Interventions Intervention Not Indicated  Transportation Interventions Intervention Not Indicated        Care Coordination Interventions:  Yes, provided   Follow up plan: Follow up call scheduled for 02/21/23    Encounter Outcome:  Pt. Visit Completed   George Ina RN,BSN,CCM [facility] Care Coordination [phone] direct line

## 2023-02-14 NOTE — Patient Outreach (Signed)
  Care Coordination   Multidisciplinary Case Review Note    02/14/2023 Name: Jad Johansson Ratchford Sr. MRN: [id number] DOB: Jan 03, 1951  Gerrit Friends Bilton Sr. is a 72 y.o. year old male who sees [name], Yehuda Mao, MD for primary care.  The  multidisciplinary care team met today to review patient care needs and barriers.     Goals Addressed             This Visit's Progress    COMPLETED: multidisciplinary team activities       Interventions Today    Flowsheet Row Most Recent Value  General Interventions   General Interventions Discussed/Reviewed General Interventions Reviewed, [employer] care needs and barriers discussed.  Referred to social worker for personal care services to assist with ADL's]  Pharmacy Interventions   Pharmacy Dicussed/Reviewed Pharmacy Topics Reviewed, Referral to Pharmacist  Referral to Pharmacist --  [medication management]              SDOH assessments and interventions completed:  No     Care Coordination Interventions Activated:  Yes   Care Coordination Interventions:  Yes, provided   Follow up plan: Follow up call scheduled for as previously scheduled    Multidisciplinary Team Attendees:   Kemper Durie, RN, BSN, CCM George Ina RN, BSN, CCM [place], [place]

## 2023-02-15 ENCOUNTER — Other Ambulatory Visit: Payer: Self-pay | Admitting: Family Medicine

## 2023-02-15 DIAGNOSIS — E1169 Type 2 diabetes mellitus with other specified complication: Secondary | ICD-10-CM

## 2023-02-16 ENCOUNTER — Other Ambulatory Visit: Payer: Self-pay

## 2023-02-16 ENCOUNTER — Emergency Department: Payer: 59

## 2023-02-16 ENCOUNTER — Emergency Department
Admission: EM | Admit: 2023-02-16 | Discharge: 2023-02-16 | Disposition: A | Discharge status: Home / Self Care | Payer: 59 | Attending: Emergency Medicine | Admitting: Emergency Medicine

## 2023-02-16 DIAGNOSIS — I2510 Atherosclerotic heart disease of native coronary artery without angina pectoris: Secondary | ICD-10-CM | POA: Diagnosis not present

## 2023-02-16 DIAGNOSIS — J449 Chronic obstructive pulmonary disease, unspecified: Secondary | ICD-10-CM | POA: Insufficient documentation

## 2023-02-16 DIAGNOSIS — I10 Essential (primary) hypertension: Secondary | ICD-10-CM | POA: Diagnosis not present

## 2023-02-16 DIAGNOSIS — E119 Type 2 diabetes mellitus without complications: Secondary | ICD-10-CM | POA: Diagnosis not present

## 2023-02-16 DIAGNOSIS — R600 Localized edema: Secondary | ICD-10-CM | POA: Insufficient documentation

## 2023-02-16 DIAGNOSIS — M7121 Synovial cyst of popliteal space [Baker], right knee: Secondary | ICD-10-CM | POA: Diagnosis not present

## 2023-02-16 DIAGNOSIS — R2242 Localized swelling, mass and lump, left lower limb: Secondary | ICD-10-CM | POA: Diagnosis present

## 2023-02-16 LAB — CBC WITH DIFFERENTIAL/PLATELET
Abs Immature Granulocytes: 0.04 10*3/uL (ref 0.00–0.07)
Basophils Absolute: 0.1 10*3/uL (ref 0.0–0.1)
Basophils Relative: 1 %
Eosinophils Absolute: 0.5 10*3/uL (ref 0.0–0.5)
Eosinophils Relative: 6 %
HCT: 50.1 % (ref 39.0–52.0)
Hemoglobin: 16.1 g/dL (ref 13.0–17.0)
Immature Granulocytes: 1 %
Lymphocytes Relative: 28 %
Lymphs Abs: 2.3 10*3/uL (ref 0.7–4.0)
MCH: 31 pg (ref 26.0–34.0)
MCHC: 32.1 g/dL (ref 30.0–36.0)
MCV: 96.3 fL (ref 80.0–100.0)
Monocytes Absolute: 0.8 10*3/uL (ref 0.1–1.0)
Monocytes Relative: 10 %
Neutro Abs: 4.4 10*3/uL (ref 1.7–7.7)
Neutrophils Relative %: 54 %
Platelets: 236 10*3/uL (ref 150–400)
RBC: 5.2 MIL/uL (ref 4.22–5.81)
RDW: 13.2 % (ref 11.5–15.5)
WBC: 8 10*3/uL (ref 4.0–10.5)
nRBC: 0 % (ref 0.0–0.2)

## 2023-02-16 LAB — BASIC METABOLIC PANEL
Anion gap: 7 (ref 5–15)
BUN: 26 mg/dL — ABNORMAL HIGH (ref 8–23)
CO2: 27 mmol/L (ref 22–32)
Calcium: 9 mg/dL (ref 8.9–10.3)
Chloride: 103 mmol/L (ref 98–111)
Creatinine, Ser: 0.95 mg/dL (ref 0.61–1.24)
GFR, Estimated: >60 mL/min (ref >60)
Glucose, Bld: 280 mg/dL — ABNORMAL HIGH (ref 70–99)
Potassium: 3.5 mmol/L (ref 3.5–5.1)
Sodium: 137 mmol/L (ref 135–145)

## 2023-02-16 MED ORDER — ACETAMINOPHEN 325 MG PO TABS
650.0000 mg | ORAL_TABLET | Freq: Once | ORAL | Status: AC
  Administered 2023-02-16: 650 mg via ORAL
  Filled 2023-02-16: qty 2

## 2023-02-16 MED ORDER — FUROSEMIDE 20 MG PO TABS: 20.0000 mg | ORAL_TABLET | Freq: Every day | ORAL | 0 refills | Status: DC

## 2023-02-16 NOTE — ED Triage Notes (Signed)
Pt to ED POV for RLE swelling. Has L BKA. Pt is poor historian. Unsure how long has been out of PO diuretics and how long leg swollen. Leg does not appear swollen. No pitting noted. Pt is alert, oriented with dry skin and unlabored respirations.

## 2023-02-16 NOTE — ED Notes (Signed)
Patient given ED sandwich tray and diet ginger ale with Dr. Asencion Partridge approval.

## 2023-02-16 NOTE — ED Provider Notes (Signed)
[facility] Provider Note    Event Date/Time   First MD Initiated Contact with Patient 02/16/23 1230     (approximate)   History   Leg Swelling   HPI  Danny Anstead Begue Sr. is a 72 y.o. male with a history of COPD, CAD, hypertension, and diabetes who presents with left leg swelling, acute onset over the last 1 to 2 days, persistent course, not associated with any acute pain.  The patient denies any numbness or weakness in the leg.  He denies any redness or rashes.  He has had no trauma to the leg recently.  He reports some shortness of breath but states that this is chronic, related to his COPD, and unchanged today.  I reviewed the past medical records.  The patient was admitted to the hospital service in May for respiratory failure due to COPD exacerbation and pneumonia.   Physical Exam   Triage Vital Signs: ED Triage Vitals  Encounter Vitals Group     BP 02/16/23 1155 107/84     Systolic BP Percentile --      Diastolic BP Percentile --      Pulse Rate 02/16/23 1130 75     Resp 02/16/23 1130 20     Temp 02/16/23 1130 98 F (36.7 C)     Temp Source 02/16/23 1130 Oral     SpO2 02/16/23 1155 95 %     Weight 02/16/23 1131 235 lb (106.6 kg)     Height 02/16/23 1131 6' (1.829 m)     Head Circumference --      Peak Flow --      Pain Score 02/16/23 1130 5     Pain Loc --      Pain Education --      Exclude from Growth Chart --     Most recent vital signs: Vitals:   02/16/23 1130 02/16/23 1155  BP:  107/84  Pulse: 75   Resp: 20   Temp: 98 F (36.7 C)   SpO2:  95%     General: Awake, no distress.  CV:  Good peripheral perfusion.  Resp:  Normal effort.  Abd:  No distention.  Other:  Left leg amputated below the knee.  Right leg with 1+ pitting edema.  No erythema, induration, or abnormal warmth.  1+ DP pulse, normal cap refill.  Normal distal sensation.  Full range of motion in ankle and knee.   ED Results / Procedures / Treatments    Labs (all labs ordered are listed, but only abnormal results are displayed) Labs Reviewed  BASIC METABOLIC PANEL - Abnormal; Notable for the following components:      Result Value   Glucose, Bld 280 (*)    BUN 26 (*)    All other components within normal limits  CBC WITH DIFFERENTIAL/PLATELET     EKG   RADIOLOGY  US venous RLE:   IMPRESSION:  1. No evidence of right lower extremity deep venous thrombosis.  2. Small amount of fluid in the proximal right calf is nonspecific  but may represent extension of a small Baker's cyst into the  proximal calf.     PROCEDURES:  Critical Care performed: No  Procedures   MEDICATIONS ORDERED IN ED: Medications  acetaminophen (TYLENOL) tablet 650 mg (650 mg Oral Given 02/16/23 1235)     IMPRESSION / MDM / ASSESSMENT AND PLAN / ED COURSE  I reviewed the triage vital signs and the nursing notes.  72 year old male with  PMH as noted above presents with right leg swelling over the last 1 to 2 days.  On exam he has some mild pitting edema.  He has no acute respiratory symptoms.  Differential diagnosis includes, but is not limited to, dependent edema, venous stasis, DVT.  There is no evidence of cellulitis or other infectious process.  There is no clinical evidence for new onset CHF.  We will obtain basic labs, ultrasound, and reassess.  Patient's presentation is most consistent with acute complicated illness / injury requiring diagnostic workup.  ----------------------------------------- 3:34 PM on 02/16/2023 -----------------------------------------  Labs are unremarkable.  Creatinine is normal.  There is no leukocytosis or anemia.  DVT study is negative.  The patient is stable for discharge home.  I will prescribe him for a 1 week course of p.o. Lasix.  I have instructed him to elevate the leg.  I gave strict return precautions and he expressed understanding.   FINAL CLINICAL IMPRESSION(S) / ED DIAGNOSES   Final diagnoses:   Leg edema     Rx / DC Orders   ED Discharge Orders          Ordered    furosemide (LASIX) 20 MG tablet  Daily        02/16/23 1532             Note:  This document was prepared using Dragon voice recognition software and may include unintentional dictation errors.    Dionne Bucy, MD 02/16/23 1534

## 2023-02-16 NOTE — Discharge Instructions (Signed)
Take the Lasix for the next week.  Keep the leg elevated when possible.  Return to the ER for new, worsening, or persistent severe swelling, pain, redness, shortness of breath, or any other new or worsening symptoms that concern you.

## 2023-02-18 ENCOUNTER — Emergency Department: Payer: 59

## 2023-02-18 ENCOUNTER — Other Ambulatory Visit: Payer: Self-pay | Admitting: Family Medicine

## 2023-02-18 ENCOUNTER — Other Ambulatory Visit: Payer: Self-pay

## 2023-02-18 ENCOUNTER — Emergency Department
Admission: EM | Admit: 2023-02-18 | Discharge: 2023-02-19 | Disposition: A | Discharge status: Home / Self Care | Payer: 59 | Attending: Emergency Medicine | Admitting: Emergency Medicine

## 2023-02-18 DIAGNOSIS — R739 Hyperglycemia, unspecified: Secondary | ICD-10-CM

## 2023-02-18 DIAGNOSIS — R918 Other nonspecific abnormal finding of lung field: Secondary | ICD-10-CM | POA: Diagnosis not present

## 2023-02-18 DIAGNOSIS — J449 Chronic obstructive pulmonary disease, unspecified: Secondary | ICD-10-CM | POA: Diagnosis not present

## 2023-02-18 DIAGNOSIS — E1165 Type 2 diabetes mellitus with hyperglycemia: Secondary | ICD-10-CM | POA: Insufficient documentation

## 2023-02-18 DIAGNOSIS — E860 Dehydration: Secondary | ICD-10-CM | POA: Diagnosis not present

## 2023-02-18 DIAGNOSIS — R079 Chest pain, unspecified: Secondary | ICD-10-CM | POA: Diagnosis not present

## 2023-02-18 DIAGNOSIS — I959 Hypotension, unspecified: Secondary | ICD-10-CM | POA: Diagnosis not present

## 2023-02-18 DIAGNOSIS — J441 Chronic obstructive pulmonary disease with (acute) exacerbation: Secondary | ICD-10-CM | POA: Insufficient documentation

## 2023-02-18 DIAGNOSIS — I10 Essential (primary) hypertension: Secondary | ICD-10-CM | POA: Diagnosis not present

## 2023-02-18 DIAGNOSIS — R0602 Shortness of breath: Secondary | ICD-10-CM | POA: Diagnosis present

## 2023-02-18 DIAGNOSIS — I2510 Atherosclerotic heart disease of native coronary artery without angina pectoris: Secondary | ICD-10-CM | POA: Insufficient documentation

## 2023-02-18 DIAGNOSIS — J4489 Other specified chronic obstructive pulmonary disease: Secondary | ICD-10-CM

## 2023-02-18 DIAGNOSIS — R0689 Other abnormalities of breathing: Secondary | ICD-10-CM | POA: Diagnosis not present

## 2023-02-18 LAB — URINALYSIS, ROUTINE W REFLEX MICROSCOPIC
Bacteria, UA: NONE SEEN
Bilirubin Urine: NEGATIVE
Glucose, UA: >=500 mg/dL — AB
Ketones, ur: NEGATIVE mg/dL
Leukocytes,Ua: NEGATIVE
Nitrite: NEGATIVE
Protein, ur: NEGATIVE mg/dL
Specific Gravity, Urine: 1.021 (ref 1.005–1.030)
pH: 5 (ref 5.0–8.0)

## 2023-02-18 LAB — BASIC METABOLIC PANEL
Anion gap: 9 (ref 5–15)
BUN: 32 mg/dL — ABNORMAL HIGH (ref 8–23)
CO2: 23 mmol/L (ref 22–32)
Calcium: 8.5 mg/dL — ABNORMAL LOW (ref 8.9–10.3)
Chloride: 105 mmol/L (ref 98–111)
Creatinine, Ser: 1.04 mg/dL (ref 0.61–1.24)
GFR, Estimated: >60 mL/min (ref >60)
Glucose, Bld: 381 mg/dL — ABNORMAL HIGH (ref 70–99)
Potassium: 3.6 mmol/L (ref 3.5–5.1)
Sodium: 137 mmol/L (ref 135–145)

## 2023-02-18 LAB — CBC
HCT: 46.2 % (ref 39.0–52.0)
Hemoglobin: 14.9 g/dL (ref 13.0–17.0)
MCH: 31.5 pg (ref 26.0–34.0)
MCHC: 32.3 g/dL (ref 30.0–36.0)
MCV: 97.7 fL (ref 80.0–100.0)
Platelets: 217 10*3/uL (ref 150–400)
RBC: 4.73 MIL/uL (ref 4.22–5.81)
RDW: 13.1 % (ref 11.5–15.5)
WBC: 7.5 10*3/uL (ref 4.0–10.5)
nRBC: 0 % (ref 0.0–0.2)

## 2023-02-18 LAB — TROPONIN I (HIGH SENSITIVITY): Troponin I (High Sensitivity): 13 ng/L (ref <18)

## 2023-02-18 LAB — CBG MONITORING, ED: Glucose-Capillary: 337 mg/dL — ABNORMAL HIGH (ref 70–99)

## 2023-02-18 LAB — BRAIN NATRIURETIC PEPTIDE: B Natriuretic Peptide: 63.6 pg/mL (ref 0.0–100.0)

## 2023-02-18 MED ORDER — DULAGLUTIDE 4.5 MG/0.5ML ~~LOC~~ SOAJ: 4.5000 mg | Freq: Once | SUBCUTANEOUS | Status: DC

## 2023-02-18 MED ORDER — ALBUTEROL SULFATE (2.5 MG/3ML) 0.083% IN NEBU
2.5000 mg | INHALATION_SOLUTION | Freq: Once | RESPIRATORY_TRACT | Status: AC
  Administered 2023-02-18: 2.5 mg via RESPIRATORY_TRACT
  Filled 2023-02-18: qty 3

## 2023-02-18 MED ORDER — IPRATROPIUM-ALBUTEROL 0.5-2.5 (3) MG/3ML IN SOLN
6.0000 mL | Freq: Once | RESPIRATORY_TRACT | Status: AC
  Administered 2023-02-18: 6 mL via RESPIRATORY_TRACT
  Filled 2023-02-18: qty 6

## 2023-02-18 MED ORDER — INSULIN ASPART 100 UNIT/ML IJ SOLN
5.0000 [IU] | Freq: Once | INTRAMUSCULAR | Status: AC
  Administered 2023-02-18: 5 [IU] via INTRAVENOUS
  Filled 2023-02-18: qty 1

## 2023-02-18 MED ORDER — ACETAMINOPHEN 500 MG PO TABS
1000.0000 mg | ORAL_TABLET | Freq: Once | ORAL | Status: AC
  Administered 2023-02-18: 1000 mg via ORAL
  Filled 2023-02-18: qty 2

## 2023-02-18 MED ORDER — ALBUTEROL SULFATE HFA 108 (90 BASE) MCG/ACT IN AERS: 2.0000 | INHALATION_SPRAY | Freq: Four times a day (QID) | RESPIRATORY_TRACT | 2 refills | Status: DC | PRN

## 2023-02-18 NOTE — ED Triage Notes (Signed)
Pt BIB [facility] d/t concerns for elevated CBG. At home CBG reading high. Hx T2DM.   In triage pt noted with labored breathing. Sts he has been more SOB, was prescribed a water pill yesterday. Continues to have difficulty catching his breathing while answering questions in triage. No cough/fever/chills. Chronic LBKA. +1 pitting edema RLE.

## 2023-02-18 NOTE — ED Provider Notes (Signed)
[facility] Provider Note    Event Date/Time   First MD Initiated Contact with Patient 02/18/23 2305     (approximate)   History   Hyperglycemia and Shortness of Breath   HPI  Danny KHACHATRYAN Sr. is a 72 y.o. male who presents to the ED for evaluation of Hyperglycemia and Shortness of Breath   I reviewed medical DC summary from 2 months ago.  History of COPD, CAD, HTN and DM.  ED visit from 2 days ago, seen for leg swelling.  Negative DVT workup and discharged on low-dose Lasix  Patient presents to the ED "because my sugars were high."  Reports his CGM read "high" and he went to a local fire department and they told him it was over 600.  Reports he gave himself some short acting subcutaneous insulin before coming to the ED.  He reports running out of some of his medications in the past week or so, including his once weekly Trulicity.  Reports he has his short acting insulin at home.  He denies any abdominal pain, emesis or diarrhea.  No fevers.  Reports he ran out of his rescue inhaler as well.  Reports his breathing has been " maybe a little worse" over the past few days when running out of his inhaler.  Denies increased sputum production, fevers, chest pain  Reports he has a PCP visit coming up on the 22nd, a week from now  Physical Exam   Triage Vital Signs: ED Triage Vitals  Encounter Vitals Group     BP 02/18/23 2053 (!) 181/106     Systolic BP Percentile --      Diastolic BP Percentile --      Pulse Rate 02/18/23 2053 (!) 101     Resp 02/18/23 2053 (!) 25     Temp 02/18/23 2053 98 F (36.7 C)     Temp Source 02/18/23 2053 Oral     SpO2 02/18/23 2047 95 %     Weight 02/18/23 2052 235 lb (106.6 kg)     Height 02/18/23 2052 6' (1.829 m)     Head Circumference --      Peak Flow --      Pain Score 02/18/23 2052 0     Pain Loc --      Pain Education --      Exclude from Growth Chart --     Most recent vital signs: Vitals:   02/19/23 0330  02/19/23 0400  BP: 127/74 136/77  Pulse: 74 77  Resp: 14 20  Temp:    SpO2: 95% 100%    General: Awake, no distress.  Supine without distress.  Speaking in full sentences CV:  Good peripheral perfusion.  Resp:  Minimal tachypnea to the low 20s.  Wheezing throughout Abd:  No distention.  Soft MSK:  No deformity noted.  Neuro:  No focal deficits appreciated. Other:     ED Results / Procedures / Treatments   Labs (all labs ordered are listed, but only abnormal results are displayed) Labs Reviewed  BASIC METABOLIC PANEL - Abnormal; Notable for the following components:      Result Value   Glucose, Bld 381 (*)    BUN 32 (*)    Calcium 8.5 (*)    All other components within normal limits  URINALYSIS, ROUTINE W REFLEX MICROSCOPIC - Abnormal; Notable for the following components:   Color, Urine STRAW (*)    APPearance CLEAR (*)    Glucose, UA >=  500 (*)    Hgb urine dipstick SMALL (*)    All other components within normal limits  CBG MONITORING, ED - Abnormal; Notable for the following components:   Glucose-Capillary 337 (*)    All other components within normal limits  CBG MONITORING, ED - Abnormal; Notable for the following components:   Glucose-Capillary 166 (*)    All other components within normal limits  CBC  BRAIN NATRIURETIC PEPTIDE  TROPONIN I (HIGH SENSITIVITY)  TROPONIN I (HIGH SENSITIVITY)    EKG Sinus rhythm with a rate of 92 bpm.  Normal axis and intervals.  No clear STEMI  RADIOLOGY 1 view CXR interpreted by me with right basilar atelectasis versus infiltrate  Official radiology report(s): DG Chest Port 1 View  Result Date: 02/18/2023 CLINICAL DATA:  Chest pain EXAM: PORTABLE CHEST 1 VIEW COMPARISON:  12/20/2022 FINDINGS: Unchanged scarring in the right mid lung. COPD. Mildly increased opacity at the right lung base, likely atelectasis. No pleural effusion or pneumothorax. Normal cardiomediastinal contours. IMPRESSION: Mildly increased opacity at the right  lung base, likely atelectasis. COPD. Electronically Signed   By: Deatra Robinson M.D.   On: 02/18/2023 21:23    PROCEDURES and INTERVENTIONS:  .1-3 Lead EKG Interpretation  Performed by: Delton Prairie, MD Authorized by: Delton Prairie, MD     Interpretation: normal     ECG rate:  80   ECG rate assessment: normal     Rhythm: sinus rhythm     Ectopy: none     Conduction: normal     Medications  albuterol (VENTOLIN HFA) 108 (90 Base) MCG/ACT inhaler 1-2 puff (has no administration in time range)  ipratropium-albuterol (DUONEB) 0.5-2.5 (3) MG/3ML nebulizer solution 6 mL (6 mLs Nebulization Given 02/18/23 2330)  insulin aspart (novoLOG) injection 5 Units (5 Units Intravenous Given 02/18/23 2331)  albuterol (PROVENTIL) (2.5 MG/3ML) 0.083% nebulizer solution 2.5 mg (2.5 mg Inhalation Given 02/18/23 2338)  acetaminophen (TYLENOL) tablet 1,000 mg (1,000 mg Oral Given 02/18/23 2335)     IMPRESSION / MDM / ASSESSMENT AND PLAN / ED COURSE  I reviewed the triage vital signs and the nursing notes.  Differential diagnosis includes, but is not limited to, COPD exacerbation, ACS, pneumothorax, DKA, hyperglycemia  {Patient presents with symptoms of an acute illness or injury that is potentially life-threatening.  Patient presents to the ED due to high sugars at home in the setting of running out of some of his medications.  He looks well clinically without distress, laying supine and speaking in full sentences.  Mild tachypnea and wheezing is noted.  Will provide breathing treatments and reassess.  Hesitant to initiate steroids in the setting of his hyperglycemia, not having all of his prescriptions at home, and fairly mild COPD exacerbation.  I attempted to provide him with his weekly Trulicity while he is here, but apparently we do not have it on formulary and it is not available.  Will provide short acting insulin and recheck his CBG.  We will obtain a second troponin and provide breathing treatments.   And reassess  Second troponin remains negative and his symptoms resolved after a breathing treatment.  We will discharge with an MDI in hand, reports having refills that he can pick up at the pharmacy, or at least his son can go pick them up for him.  He has a PCP visit next Monday on the 22nd.  While I considered observation admission for this patient, his symptoms resolved and he looks so well I think outpatient management  is reasonable.  We discussed return precautions.  Clinical Course as of 02/19/23 0503  Tue Feb 19, 2023  0147 Reassessed, feeling better. No longer wheezing, reports dyspnea has resolved. Discussed plan of care [DS]  0441 reassessed [DS]    Clinical Course User Index [DS] Delton Prairie, MD     FINAL CLINICAL IMPRESSION(S) / ED DIAGNOSES   Final diagnoses:  COPD exacerbation (HCC)  Hyperglycemia     Rx / DC Orders   ED Discharge Orders          Ordered    albuterol (VENTOLIN HFA) 108 (90 Base) MCG/ACT inhaler  Every 6 hours PRN        02/18/23 2322             Note:  This document was prepared using Dragon voice recognition software and may include unintentional dictation errors.   Delton Prairie, MD 02/19/23 [phone]

## 2023-02-18 NOTE — ED Triage Notes (Signed)
EMS brings pt in from home for elevated FSBS 564 and after IVF bolus 426

## 2023-02-19 ENCOUNTER — Telehealth: Payer: Self-pay | Admitting: Family Medicine

## 2023-02-19 DIAGNOSIS — I10 Essential (primary) hypertension: Secondary | ICD-10-CM | POA: Diagnosis not present

## 2023-02-19 DIAGNOSIS — J441 Chronic obstructive pulmonary disease with (acute) exacerbation: Secondary | ICD-10-CM | POA: Diagnosis not present

## 2023-02-19 DIAGNOSIS — Z743 Need for continuous supervision: Secondary | ICD-10-CM | POA: Diagnosis not present

## 2023-02-19 LAB — CBG MONITORING, ED
Glucose-Capillary: 166 mg/dL — ABNORMAL HIGH (ref 70–99)
Glucose-Capillary: 292 mg/dL — ABNORMAL HIGH (ref 70–99)

## 2023-02-19 LAB — TROPONIN I (HIGH SENSITIVITY): Troponin I (High Sensitivity): 15 ng/L (ref <18)

## 2023-02-19 MED ORDER — ALBUTEROL SULFATE HFA 108 (90 BASE) MCG/ACT IN AERS
1.0000 | INHALATION_SPRAY | Freq: Once | RESPIRATORY_TRACT | Status: AC
  Administered 2023-02-19: 2 via RESPIRATORY_TRACT
  Filled 2023-02-19: qty 6.7

## 2023-02-19 MED ORDER — ALBUTEROL SULFATE (2.5 MG/3ML) 0.083% IN NEBU: 2.5000 mg | INHALATION_SOLUTION | Freq: Once | RESPIRATORY_TRACT | Status: DC

## 2023-02-19 NOTE — ED Notes (Signed)
Pt lying on stretcher at this time, denies needs. Lights dimmed per request. Pt awaiting EMS transport home. WCTM. Call light in reach.

## 2023-02-19 NOTE — ED Notes (Signed)
called to [facility] for pt transport home/rep:jay.

## 2023-02-19 NOTE — ED Notes (Signed)
Cleaned pt and provided new brief after incontinence of bowel episode, urinal emptied

## 2023-02-19 NOTE — Telephone Encounter (Signed)
Mandi from [facility] in [place], [place] # [phone] . She needs the most recent medication list for patient, she is making up his packets. Please put Attn: Mandi on fax.

## 2023-02-19 NOTE — Telephone Encounter (Signed)
Stated that medication was stopped at discharge. Is it okay to refuse?

## 2023-02-19 NOTE — Telephone Encounter (Signed)
Both medications were stopped at discharge. Is it okay to refuse the refills?

## 2023-02-20 ENCOUNTER — Other Ambulatory Visit (INDEPENDENT_AMBULATORY_CARE_PROVIDER_SITE_OTHER): Payer: Self-pay | Admitting: Vascular Surgery

## 2023-02-20 ENCOUNTER — Telehealth: Payer: Self-pay

## 2023-02-20 ENCOUNTER — Other Ambulatory Visit: Payer: Self-pay | Admitting: Family Medicine

## 2023-02-20 DIAGNOSIS — E785 Hyperlipidemia, unspecified: Secondary | ICD-10-CM

## 2023-02-20 DIAGNOSIS — E1169 Type 2 diabetes mellitus with other specified complication: Secondary | ICD-10-CM

## 2023-02-20 DIAGNOSIS — I25118 Atherosclerotic heart disease of native coronary artery with other forms of angina pectoris: Secondary | ICD-10-CM

## 2023-02-20 DIAGNOSIS — I5032 Chronic diastolic (congestive) heart failure: Secondary | ICD-10-CM

## 2023-02-20 NOTE — Telephone Encounter (Signed)
Patient needs to make an appointment as soon as possible

## 2023-02-20 NOTE — Telephone Encounter (Signed)
Mandi called from [facility] to state patient was supposed to start getting compliance packaging from [facility] in March, 2024, originally, but he never got them.  In the meantime he has been getting [facility].  [facility] states she has patient's med list but [facility] has not sent all of the prescriptions to her.  Mandi states she would like for [place] to please send fresh prescriptions for everything on patient's med list.

## 2023-02-20 NOTE — Telephone Encounter (Signed)
Please call the patient and see if he is taking trulicity or mounjaro at home. Please have him look at his medications to determine this. Thanks.

## 2023-02-20 NOTE — Telephone Encounter (Signed)
Printed medication list and faxed back ATTN: Mandi.

## 2023-02-20 NOTE — Telephone Encounter (Signed)
Phone number listed is not a working number.

## 2023-02-20 NOTE — Telephone Encounter (Signed)
These are not appropriate to refill. If he is having breathing issues he needs to be evaluated.

## 2023-02-21 ENCOUNTER — Telehealth: Payer: Self-pay

## 2023-02-21 ENCOUNTER — Ambulatory Visit: Payer: Self-pay

## 2023-02-21 MED ORDER — SPIRONOLACTONE 25 MG PO TABS
ORAL_TABLET | ORAL | 1 refills | Status: DC
Start: 2023-02-21 — End: 2023-08-26

## 2023-02-21 MED ORDER — AMLODIPINE BESYLATE 10 MG PO TABS: 10.0000 mg | ORAL_TABLET | Freq: Every day | ORAL | 1 refills | Status: DC

## 2023-02-21 MED ORDER — EMPAGLIFLOZIN 25 MG PO TABS
25.0000 mg | ORAL_TABLET | Freq: Every day | ORAL | 1 refills | Status: DC
Start: 2023-02-21 — End: 2024-01-27

## 2023-02-21 MED ORDER — CARVEDILOL 25 MG PO TABS: 25.0000 mg | ORAL_TABLET | Freq: Two times a day (BID) | ORAL | 1 refills | Status: DC

## 2023-02-21 MED ORDER — TAMSULOSIN HCL 0.4 MG PO CAPS
0.4000 mg | ORAL_CAPSULE | Freq: Every day | ORAL | 1 refills | Status: DC
Start: 2023-02-21 — End: 2023-11-14

## 2023-02-21 MED ORDER — ATORVASTATIN CALCIUM 80 MG PO TABS
80.0000 mg | ORAL_TABLET | Freq: Every day | ORAL | 1 refills | Status: DC
Start: 2023-02-21 — End: 2024-01-27

## 2023-02-21 MED ORDER — ESCITALOPRAM OXALATE 10 MG PO TABS: 10.0000 mg | ORAL_TABLET | Freq: Every day | ORAL | 1 refills | Status: DC

## 2023-02-21 NOTE — Addendum Note (Signed)
Addended by: Benedict Needy on: 02/21/2023 03:20 PM   Modules accepted: Orders

## 2023-02-21 NOTE — Patient Outreach (Signed)
  Care Coordination   02/21/2023 Name: Danny Barkdull Thornley Sr. MRN: [id number] DOB: Dec 05, 1950   Care Coordination Outreach Attempts:  An unsuccessful telephone outreach was attempted for a scheduled appointment today. Attempted call to listed number for patient and son, [name]. Unable to reach patient or leave voice message. Message states call cannot be completed as dialed.   Follow Up Plan:  Additional outreach attempts will be made to offer the patient care coordination information and services.   Encounter Outcome:  No Answer   Care Coordination Interventions:  No, not indicated    George Ina [facility] Care Coordination [phone] direct line

## 2023-02-21 NOTE — Telephone Encounter (Signed)
Have reached out to [place] to get a good number for pt.

## 2023-02-21 NOTE — Telephone Encounter (Signed)
Called and spoke with [facility] to be clear on what prescriptions were needed.  Attempted to call pt the number listed is not a working number.  Have called Lurena Joiner, daughter in law and Arline Asp, daughter to get a new phone number for him.  Need to know if he wants the medications filled at [facility].

## 2023-02-21 NOTE — Telephone Encounter (Signed)
New prescriptions needed for pill packs being done by [facility].  [facility]ed [facility] to [facility] any future prescriptions for medications prescribed by Dr. [name], pharmacist reported that pt had no active prescriptions.  Called Mandi to notify her that the medications were sent in.

## 2023-02-21 NOTE — Patient Outreach (Signed)
  Care Coordination   02/21/2023 Name: Danny Goodwyn Molinelli Sr. MRN: [id number] DOB: 1950-12-20   Care Coordination Outreach Attempts:  An unsuccessful telephone outreach was attempted today to offer the patient information about available care coordination services.  Call attempted to Danny [name] daughter, listed contact.  HIPAA compliant message left with call back phone number and return call request.   Follow Up Plan:  Additional outreach attempts will be made to offer the patient care coordination information and services.   Encounter Outcome:  No Answer   Care Coordination Interventions:  No, not indicated    George Ina [facility] Care Coordination [phone] direct line

## 2023-02-21 NOTE — Patient Outreach (Signed)
  Care Coordination   02/21/2023 Name: Danny Woodmansee Topel Sr. MRN: [id number] DOB: [birth year]   Care Coordination Outreach Attempts:  Received incoming call from patients daughter, Danny [name].  Provided Ms. Dunn contact phone number and requested she have either patient or [employer]. Return call.  Daughter states she is not on speaking terms with her brother but will attempt to reach patient and have him call this RNCM.   Follow Up Plan:  Additional outreach attempts will be made to offer the patient care coordination information and services.   Encounter Outcome:  Requested return call from patient. THN Tip this will not be part of the note when signed-REQUIRED REPORT FIELD DO NOT DELETE (Optional):[id number]}  Care Coordination Interventions:  No, not indicated    George Ina [facility] Care Coordination [phone] direct line

## 2023-02-25 ENCOUNTER — Encounter: Payer: Self-pay | Admitting: Family Medicine

## 2023-02-25 ENCOUNTER — Encounter: Payer: Self-pay | Admitting: Emergency Medicine

## 2023-02-25 ENCOUNTER — Ambulatory Visit (INDEPENDENT_AMBULATORY_CARE_PROVIDER_SITE_OTHER): Payer: 59 | Admitting: Pharmacist

## 2023-02-25 ENCOUNTER — Emergency Department
Admission: EM | Admit: 2023-02-25 | Discharge: 2023-02-26 | Disposition: A | Discharge status: Home / Self Care | Payer: 59 | Attending: Emergency Medicine | Admitting: Emergency Medicine

## 2023-02-25 ENCOUNTER — Emergency Department: Payer: 59

## 2023-02-25 ENCOUNTER — Other Ambulatory Visit: Payer: Self-pay

## 2023-02-25 ENCOUNTER — Ambulatory Visit (INDEPENDENT_AMBULATORY_CARE_PROVIDER_SITE_OTHER): Payer: 59 | Admitting: Family Medicine

## 2023-02-25 VITALS — BP 124/78 | HR 77 | Temp 97.6°F

## 2023-02-25 DIAGNOSIS — R600 Localized edema: Secondary | ICD-10-CM | POA: Diagnosis not present

## 2023-02-25 DIAGNOSIS — N289 Disorder of kidney and ureter, unspecified: Secondary | ICD-10-CM | POA: Insufficient documentation

## 2023-02-25 DIAGNOSIS — E119 Type 2 diabetes mellitus without complications: Secondary | ICD-10-CM | POA: Diagnosis not present

## 2023-02-25 DIAGNOSIS — Z7985 Long-term (current) use of injectable non-insulin antidiabetic drugs: Secondary | ICD-10-CM | POA: Diagnosis not present

## 2023-02-25 DIAGNOSIS — J449 Chronic obstructive pulmonary disease, unspecified: Secondary | ICD-10-CM | POA: Diagnosis not present

## 2023-02-25 DIAGNOSIS — E785 Hyperlipidemia, unspecified: Secondary | ICD-10-CM | POA: Diagnosis not present

## 2023-02-25 DIAGNOSIS — R944 Abnormal results of kidney function studies: Secondary | ICD-10-CM | POA: Insufficient documentation

## 2023-02-25 DIAGNOSIS — E1169 Type 2 diabetes mellitus with other specified complication: Secondary | ICD-10-CM

## 2023-02-25 DIAGNOSIS — Z89512 Acquired absence of left leg below knee: Secondary | ICD-10-CM | POA: Diagnosis not present

## 2023-02-25 DIAGNOSIS — J439 Emphysema, unspecified: Secondary | ICD-10-CM | POA: Diagnosis not present

## 2023-02-25 DIAGNOSIS — M7989 Other specified soft tissue disorders: Secondary | ICD-10-CM | POA: Diagnosis not present

## 2023-02-25 DIAGNOSIS — Z7984 Long term (current) use of oral hypoglycemic drugs: Secondary | ICD-10-CM

## 2023-02-25 DIAGNOSIS — M19071 Primary osteoarthritis, right ankle and foot: Secondary | ICD-10-CM | POA: Diagnosis not present

## 2023-02-25 DIAGNOSIS — M7121 Synovial cyst of popliteal space [Baker], right knee: Secondary | ICD-10-CM | POA: Diagnosis not present

## 2023-02-25 DIAGNOSIS — M7731 Calcaneal spur, right foot: Secondary | ICD-10-CM | POA: Diagnosis not present

## 2023-02-25 DIAGNOSIS — I739 Peripheral vascular disease, unspecified: Secondary | ICD-10-CM

## 2023-02-25 DIAGNOSIS — M79604 Pain in right leg: Secondary | ICD-10-CM | POA: Diagnosis not present

## 2023-02-25 DIAGNOSIS — S90851A Superficial foreign body, right foot, initial encounter: Secondary | ICD-10-CM | POA: Diagnosis not present

## 2023-02-25 LAB — CBC WITH DIFFERENTIAL/PLATELET
Abs Immature Granulocytes: 0.06 10*3/uL (ref 0.00–0.07)
Basophils Absolute: 0.1 10*3/uL (ref 0.0–0.1)
Basophils Relative: 1 %
Eosinophils Absolute: 0.6 10*3/uL — ABNORMAL HIGH (ref 0.0–0.5)
Eosinophils Relative: 8 %
HCT: 48.9 % (ref 39.0–52.0)
Hemoglobin: 16.1 g/dL (ref 13.0–17.0)
Immature Granulocytes: 1 %
Lymphocytes Relative: 32 %
Lymphs Abs: 2.4 10*3/uL (ref 0.7–4.0)
MCH: 31.1 pg (ref 26.0–34.0)
MCHC: 32.9 g/dL (ref 30.0–36.0)
MCV: 94.6 fL (ref 80.0–100.0)
Monocytes Absolute: 0.7 10*3/uL (ref 0.1–1.0)
Monocytes Relative: 9 %
Neutro Abs: 3.6 10*3/uL (ref 1.7–7.7)
Neutrophils Relative %: 49 %
Platelets: 264 10*3/uL (ref 150–400)
RBC: 5.17 MIL/uL (ref 4.22–5.81)
RDW: 12.9 % (ref 11.5–15.5)
WBC: 7.5 10*3/uL (ref 4.0–10.5)
nRBC: 0 % (ref 0.0–0.2)

## 2023-02-25 LAB — COMPREHENSIVE METABOLIC PANEL
ALT: 25 U/L (ref 0–44)
AST: 17 U/L (ref 15–41)
Albumin: 4.4 g/dL (ref 3.5–5.0)
Alkaline Phosphatase: 97 U/L (ref 38–126)
Anion gap: 8 (ref 5–15)
BUN: 37 mg/dL — ABNORMAL HIGH (ref 8–23)
CO2: 25 mmol/L (ref 22–32)
Calcium: 9.1 mg/dL (ref 8.9–10.3)
Chloride: 104 mmol/L (ref 98–111)
Creatinine, Ser: 1.53 mg/dL — ABNORMAL HIGH (ref 0.61–1.24)
GFR, Estimated: 48 mL/min — ABNORMAL LOW (ref >60)
Glucose, Bld: 210 mg/dL — ABNORMAL HIGH (ref 70–99)
Potassium: 3.8 mmol/L (ref 3.5–5.1)
Sodium: 137 mmol/L (ref 135–145)
Total Bilirubin: 0.5 mg/dL (ref 0.3–1.2)
Total Protein: 7.4 g/dL (ref 6.5–8.1)

## 2023-02-25 LAB — BRAIN NATRIURETIC PEPTIDE: B Natriuretic Peptide: 78.7 pg/mL (ref 0.0–100.0)

## 2023-02-25 MED ORDER — ASPIRIN 81 MG PO TBEC: 81.0000 mg | DELAYED_RELEASE_TABLET | Freq: Every day | ORAL | 3 refills | Status: AC

## 2023-02-25 MED ORDER — UMECLIDINIUM-VILANTEROL 62.5-25 MCG/ACT IN AEPB: 1.0000 | INHALATION_SPRAY | Freq: Every day | RESPIRATORY_TRACT | 11 refills | Status: DC

## 2023-02-25 NOTE — Progress Notes (Signed)
02/25/2023 Name: Danny Voorhies Mervine Sr. MRN: [id number] DOB: Oct 09, 1950  Chief Complaint  Patient presents with   Medication Management    Danny Friends Dye Sr. is a 72 y.o. year old male who was referred for medication management by their primary care provider, [name], Yehuda Mao, MD. They presented for a face to face visit today.   They were referred to the pharmacist by their PCP for assistance in managing complex medication management    Subjective:  Care Team: Primary Care Provider: [name], MD ; Next Scheduled Visit: today  Medication Access/Adherence  Current Pharmacy:  [facility], [place] - [address] [place] [address] Phone: [phone] Fax: [phone]  [facility] - [place], [place] - [address] [place] [address] Phone: [phone] Fax: [phone]   Patient reports affordability concerns with their medications: No  Patient reports access/transportation concerns to their pharmacy: Yes  Patient reports adherence concerns with their medications:  Yes    Needs new wheelchair; wants electric wheelchair   Med Management: Getting packages from [facility]. Currently packaged:  - AM:  spironolactone, escitalopram, Jardiance, carvedilol, amlodipine, clopidogrel  - PM: tamsulosin, carvedilol, atorvastatin  No script for aspirin, so that was not included. They will include next month.   Reports he picked up a 3 month supply of Trulicity 4.5 mg weekly recently  Diabetes:  Current medications: Jardiance 25 mg daily, Trulicity 4.5 mg weekly  Current glucose readings: reports a reading of 128 per Libre CGM the other day, does not recall what time of the day.   He is wearing Libre 2, but did not bring reader with him.   Hyperlipidemia/ASCVD Risk Reduction  Current lipid lowering medications: atorvastatin 80 mg daily  Antiplatelet regimen: clopidogrel 75 mg daily + aspirin 81 mg daily  Heart  Failure:  Current medications:  ACEi/ARB/ARNI: Entresto held due to prior AKI SGLT2i: Jardiance 25 mg daily Beta blocker: carvedilol 25 mg twice daily Mineralocorticoid Receptor Antagonist: spironolactone 25 mg daily Diuretic regimen: not taking   Additional antihypertensive: amlodipine 10 mg daily  Has not been seen by Cardiology since Entresto was discontinued.  COPD:  Current medications: albuterol HFA PRN Medications tried in the past: several different inhalers, no real sense of which were more beneficial than others  Reports several exacerbations in the past year, recent pneumonia  Son reports that shortness of breath comes and goes  Depression/Anxiety:  Current medications: escitalopram 10 mg daily   BPH: Current medications: tamsulosin 0.4 mg weekly    Objective:  Lab Results  Component Value Date   HGBA1C 9.6 (H) 09/12/2022    Lab Results  Component Value Date   CREATININE 1.04 02/18/2023   BUN 32 (H) 02/18/2023   NA 137 02/18/2023   K 3.6 02/18/2023   CL 105 02/18/2023   CO2 23 02/18/2023    Lab Results  Component Value Date   CHOL 136 09/12/2022   HDL 55.70 09/12/2022   LDLCALC 59 09/12/2022   LDLDIRECT 64.0 03/20/2018   TRIG 103.0 09/12/2022   CHOLHDL 2 09/12/2022    Medications Reviewed Today     Reviewed by Alden Hipp, RPH-CPP (Pharmacist) on 02/25/23 at 1554  Med List Status: <None>   Medication Order Taking? Sig Documenting Provider Last Dose Status Informant  acetaminophen (TYLENOL) 325 MG tablet [id number]  Take 2 tablets (650 mg total) by mouth every 4 (four) hours as needed for mild  pain (or temp > 37.5 C (99.5 F)). Dorcas Carrow, MD  Active Pharmacy Records  albuterol (PROVENTIL) (2.5 MG/3ML) 0.083% nebulizer solution [id number]  Take 3 mLs (2.5 mg total) by nebulization every 6 (six) hours as needed for wheezing or shortness of breath.  Patient not taking: Reported on 02/25/2023   [name], MD  Active Pharmacy  Records  albuterol (VENTOLIN HFA) 108 [phone] Base) MCG/ACT inhaler [id number]  Inhale 2 puffs into the lungs every 6 (six) hours as needed for wheezing or shortness of breath. Delton Prairie, MD  Active   Albuterol Sulfate (PROAIR RESPICLICK) 108 (90 Base) MCG/ACT AEPB [id number]  Inhale 1 puff into the lungs every 6 (six) hours as needed. [name], MD  Active Pharmacy Records  amLODipine (NORVASC) 10 MG tablet [id number]  Take 1 tablet (10 mg total) by mouth daily. [name], MD  Active     Discontinued 02/25/23 1522 aspirin EC 81 MG tablet [id number] Yes Take 1 tablet (81 mg total) by mouth daily. [name], MD  Active   atorvastatin (LIPITOR) 80 MG tablet [id number]  Take 1 tablet (80 mg total) by mouth daily. [name], MD  Active   carvedilol (COREG) 25 MG tablet [id number] Yes Take 1 tablet (25 mg total) by mouth 2 (two) times daily with a meal. [name], MD Taking Active   clopidogrel (PLAVIX) 75 MG tablet [id number] Yes TAKE 1 TABLET BY MOUTH ONCE DAILY Dew, Marlow Baars, MD Taking Active   Continuous Blood Gluc Receiver (FREESTYLE LIBRE 2 READER) DEVI [id number]  1 Device by Does not apply route 4 (four) times daily. [name], MD  Active Pharmacy Records  Continuous Glucose Sensor ([address] [place] [address] [place]) [place] [id number] Yes Check blood sugars [name], MD Taking Active Pharmacy Records  Dulaglutide (TRULICITY) 4.5 MG/0.5ML [facility] [id number] Yes Inject 4.5 mg into the skin once a week. [name], MD Taking Active Pharmacy Records  empagliflozin (JARDIANCE) 25 MG TABS tablet [id number] Yes Take 1 tablet (25 mg total) by mouth daily. [name], MD Taking Active   escitalopram (LEXAPRO) 10 MG tablet [id number] Yes Take 1 tablet (10 mg total) by mouth daily. [name], MD Taking Active   furosemide (LASIX) 20 MG tablet [id number]  Take 1 tablet (20 mg total) by mouth daily for 7 days. Dionne Bucy, MD  Expired 02/23/23  2359   nitroGLYCERIN (NITROSTAT) 0.4 MG SL tablet [id number]  Place 0.4 mg under the tongue every 5 (five) minutes as needed for chest pain. [provider]  Active Pharmacy Records  spironolactone (ALDACTONE) 25 MG tablet [id number] Yes TAKE 1 TABLET (25 MG TOTAL) BY MOUTH DAILY. [name], MD Taking Active   tamsulosin [facility]) 0.4 MG CAPS capsule [id number] Yes Take 1 capsule (0.4 mg total) by mouth daily. [name], MD Taking Active   umeclidinium-vilanterol [facility] ELLIPTA) 62.5-25 MCG/ACT AEPB [id number] Yes Inhale 1 puff into the lungs daily. [name], MD  Active               Assessment/Plan:    Med Management: - Reviewed indications with patient and son. They will pick up from [facility] and start taking  Diabetes: - Uncontrolled, though related to medication access and adherence issues - Recommend to continue current regimen at this time. Continue to use Josephine Igo, will review CGM information at next visit  Hyperlipidemia/ASCVD Risk Reduction - Controlled - Recommend to continue current regimen at this  time  Heart Failure: - Opportunity for optimization by restarting Entresto, decreasing/eliminating amlodipine, but will hold on any changed at this time and focus on adherence. Will review need to schedule with Cardiology at next appointment.  - Continue current regimen at this time  COPD: - Uncontrolled and suboptimally treated - Start Anoro 1 puff daily (avoid ICS in Trelegy at this time due to recent pneumonia). Discussed concepts of maintenance vs reliever regimen.   Depression/Anxiety: - Continue current regimen at this time  BPH: - Continue current regimen at this time   Follow Up Plan: phone call in 4 weeks  Catie Eppie Gibson, PharmD, BCACP, CPP Clinical Pharmacist [facility] [phone]

## 2023-02-25 NOTE — ED Provider Notes (Signed)
[facility] Provider Note    Event Date/Time   First MD Initiated Contact with Patient 02/25/23 2303     (approximate)   History   Leg Swelling   HPI  Danny DIESEL Sr. is a 72 y.o. male on review of primary care note from today as a history of COPD left lower extremity amputation, diabetes  Concerns of R leg swelling. About same as when he came to [facility] 2-3 days ago. Reports saw PCP today and 'he didn't think much about it' and that PCP looked at it and checked it. Recommended he setup an appointment with vascular, but not sure when.  Once, not now, it durned dark/black color but got better after stents went in. It is not painful. For about a month it has been somewhat swollen.     Reviewed primary care note, they report issues with the right leg to be chronic he had a vascular surgery consult referral placed for him today.  Patient denies any new pain fever redness or other concerns but notes that the right leg will swell up sometimes and then will come and go.  Physical Exam   Triage Vital Signs: ED Triage Vitals [02/25/23 2006]  Encounter Vitals Group     BP 135/86     Systolic BP Percentile      Diastolic BP Percentile      Pulse Rate 78     Resp 18     Temp 98 F (36.7 C)     Temp Source Oral     SpO2 96 %     Weight      Height      Head Circumference      Peak Flow      Pain Score      Pain Loc      Pain Education      Exclude from Growth Chart     Most recent vital signs: Vitals:   02/25/23 2330 02/26/23 0000  BP: (!) 147/87 (!) 158/89  Pulse: 71 80  Resp:  18  Temp:  98 F (36.7 C)  SpO2: 95% 95%     General: Awake, no distress.  CV:  Good peripheral perfusion.  Resp:  Normal effort.  Normal work of breathing Abd:  No distention.  Other:  On left lower extremity BKA  Right lower extremity warm well-perfused peripherally.  He reports decreased sensation in stocking glove like pattern involving the right foot and  ankle.  There is no broken skin lesions necrosis or erythema.  Reports a paresthesia type stinging discomfort when touching the foot and ankle region.  There is no obvious deformity.  He does not typically put pressure or walk on it  He has dopplerable dorsalis pedis and posterior tibial pulses in the right foot and normal capillary refill.  There is not appear to be any element of significant vascular compromise.  Additionally, he does have moderate 2+ pitting edema from approximately the level of the anterior tibial spine throughout the ankle and foot.  He reports this has been present for about 1 month, tends to come and go  ED Results / Procedures / Treatments   Labs (all labs ordered are listed, but only abnormal results are displayed) Labs Reviewed  CBC WITH DIFFERENTIAL/PLATELET - Abnormal; Notable for the following components:      Result Value   Eosinophils Absolute 0.6 (*)    All other components within normal limits  COMPREHENSIVE METABOLIC PANEL - Abnormal;  Notable for the following components:   Glucose, Bld 210 (*)    BUN 37 (*)    Creatinine, Ser 1.53 (*)    GFR, Estimated 48 (*)    All other components within normal limits  BRAIN NATRIURETIC PEPTIDE   No labs reviewed CBC is normal BNP normal Creatinine 1.5, slight elevation from previous baseline.  GFR minimally reduced.  This may be in the setting of recent diuresis  Appears well-hydrated has no respiratory symptoms, is normotensive.  EKG     RADIOLOGY   X-ray of the right foot interpreted by me as negative for fracture.  Edema present.  Radiologist notes a small foreign body in the area overlying the soft tissues of the heel, there is no corresponding lesion on examination I suspect this is likely a small foreign body that is been retained over time.  He does not have any clinical symptoms, injury, or laceration to suggest this would be an acute process.  No previous right foot x-ray noted to compare to  US  Venous Img Lower Unilateral Right  Result Date: 02/26/2023 CLINICAL DATA:  Right leg pain and swelling for 3 days EXAM: RIGHT LOWER EXTREMITY VENOUS DOPPLER ULTRASOUND TECHNIQUE: Gray-scale sonography with graded compression, as well as color Doppler and duplex ultrasound were performed to evaluate the lower extremity deep venous systems from the level of the common femoral vein and including the common femoral, femoral, profunda femoral, popliteal and calf veins including the posterior tibial, peroneal and gastrocnemius veins when visible. The superficial great saphenous vein was also interrogated. Spectral Doppler was utilized to evaluate flow at rest and with distal augmentation maneuvers in the common femoral, femoral and popliteal veins. COMPARISON:  None Available. FINDINGS: Contralateral Common Femoral Vein: Respiratory phasicity is normal and symmetric with the symptomatic side. No evidence of thrombus. Normal compressibility. Common Femoral Vein: No evidence of thrombus. Normal compressibility, respiratory phasicity and response to augmentation. Saphenofemoral Junction: No evidence of thrombus. Normal compressibility and flow on color Doppler imaging. Profunda Femoral Vein: No evidence of thrombus. Normal compressibility and flow on color Doppler imaging. Femoral Vein: No evidence of thrombus. Normal compressibility, respiratory phasicity and response to augmentation. Popliteal Vein: No evidence of thrombus. Normal compressibility, respiratory phasicity and response to augmentation. Calf Veins: No evidence of thrombus. Normal compressibility and flow on color Doppler imaging. Superficial Great Saphenous Vein: No evidence of thrombus. Normal compressibility. Venous Reflux:  None. Other Findings: Popliteal cyst is noted measuring 4.9 x 0.8 x 1.6 cm. IMPRESSION: No evidence of deep venous thrombosis. Right popliteal cyst. Electronically Signed   By: Alcide Clever M.D.   On: 02/26/2023 01:58   DG Foot  Complete Right  Result Date: 02/26/2023 CLINICAL DATA:  Right leg and foot swelling, diabetic. Evaluate for fractures. EXAM: RIGHT FOOT COMPLETE - 3+ VIEW COMPARISON:  Right great toe series 08/02/2022 FINDINGS: There is mild-to-moderate diffuse edema. There are vascular calcifications in the distal foreleg and foot up to the base of the toes. There is osteopenia without evidence of fractures or destructive bone lesion. There is an accessory navicular bone. There are mild features of arthrosis in the midfoot, as well as plantar and posterior calcaneal enthesopathic spurring. On the oblique view, in the lateral heel area there is a dense but not opaque triangular subcutaneous foreign body measuring 4.5 x 3 mm, which could represent an embedded piece of glass or plastic. There is no other visible foreign body. IMPRESSION: 1. No evidence of fractures or destructive bone lesion. 2.  Diffuse edema. 3. 4.5 x 3 mm triangular dense but nonopaque subcutaneous foreign body in the lateral heel area, which could represent an embedded piece of glass or plastic. 4. Vascular calcifications. 5. Osteopenia and degenerative changes. Electronically Signed   By: Almira Bar M.D.   On: 02/26/2023 00:15      PROCEDURES:  Critical Care performed: No  Procedures   MEDICATIONS ORDERED IN ED: Medications - No data to display   IMPRESSION / MDM / ASSESSMENT AND PLAN / ED COURSE  I reviewed the triage vital signs and the nursing notes.                              Differential diagnosis includes, but is not limited to, peripheral edema, venous insufficiency, congestive heart failure, hypoalbuminemia, third spacing etc.  He has a history of edema in this lower extremity recurrent, and today shows no evidence of respiratory distress, dyspnea, or evidence that would support obvious CHF or volume overload with a normal BNP also noted.  If anything he may be mildly dehydrated on his labs today his creatinine slightly  increased from baseline but he appears quite euvolemic on exam except for the edema in the right lower extremity.  DVT excluded utilizing ultrasound.  No evidence of cellulitis, skin breakdown or laceration necrotizing process etc.  X-ray obtained to evaluate for stress fracture in the setting of his neuropathy, no fracture noted.  Patient's presentation is most consistent with acute complicated illness / injury requiring diagnostic workup.        ----------------------------------------- 11:34 PM on 02/25/2023 ----------------------------------------- Placed a urgent referral request for patient to have follow-up with Dr. [name] as well as sent an inbox message to Dr. [name] personally requesting he have the patient be seen hopefully within the next week to recheck renal function.  At this juncture, patient appears euvolemic and has no flank pain, renal complaints.   ----------------------------------------- 2:10 AM on 02/26/2023 ----------------------------------------- Patient resting comfortably, agreeable with plan to set up follow-up with vascular surgery which she is already anticipating given his primary care referred him today, and also he advises he would call and be good with seeing Dr. [name] within the next week for reevaluation for his kidney testing.  He will increase his oral intake more water about 1 to 2 cups daily from his baseline, and follow-up.  Return precautions and treatment recommendations and follow-up discussed with the patient who is agreeable with the plan.   FINAL CLINICAL IMPRESSION(S) / ED DIAGNOSES   Final diagnoses:  Renal insufficiency  Peripheral edema     Rx / DC Orders   ED Discharge Orders          Ordered    Ambulatory referral to Vascular Surgery       Comments: Recurrent R leg edema, history of vascular stenting   02/25/23 2327    Ambulatory Referral to Primary Care (Establish Care)       Comments: Needs follow-up this  week with Dr. [name], re: new, mild renal insufficiency   02/25/23 2333             Note:  This document was prepared using Dragon voice recognition software and may include unintentional dictation errors.   Sharyn Creamer, MD 02/26/23 [phone]

## 2023-02-25 NOTE — Assessment & Plan Note (Signed)
Chronic issue.  Patient will start Anoro 1 puff daily.  We will see how he does with his breathing once starting on this.

## 2023-02-25 NOTE — Patient Instructions (Addendum)
Danny [name],   It was great seeing you today!  Take your medications in the pill packages EVERY DAY. This is what they have:  Morning:  - Spironolactone (blood pressure) - Escitalopram (mood) - Jardiance (blood sugar) - Carvedilol (blood pressure) - Amlodipine (blood pressure) - Clopidogrel (protection from heart attack)  Evening: - Tamsulosin (prostate) - Carvedilol (blood pressure) - Atorvastatin (cholesterol)  Next month, a baby aspirin will be in the packages  Every morning, take Anoro 1 puff once daily  Every Monday, take Trulicity 4.5 mg weekly.   I will call you in 4 weeks.   Catie Eppie Gibson, PharmD, BCACP, CPP Clinical Pharmacist [facility] [phone]

## 2023-02-25 NOTE — Assessment & Plan Note (Signed)
Chronic issue.  Refer to vascular surgery for follow-up.

## 2023-02-25 NOTE — ED Triage Notes (Signed)
Pt presents to triage via POV with complaints of  R leg swelling x 3 days. Pt has erythema and +1 pitting edema to his leg. Hx of LBKA. Pt endorses compliance with his diuretic. A&Ox4 at this time. Denies CP or SOB.

## 2023-02-25 NOTE — Assessment & Plan Note (Signed)
Chronic issue.  Check A1c.  Patient will resume Trulicity 4.5 mg weekly and Jardiance 25 mg daily.

## 2023-02-25 NOTE — Patient Instructions (Signed)
I have placed a physical therapy order to start your power wheelchair evaluation.  If you do not hear from them in the next 2 weeks please let us know. I also placed a referral to an eye doctor as well as vascular surgery.  If you do not hear from them in the next 2 weeks please let us know.

## 2023-02-25 NOTE — Assessment & Plan Note (Signed)
Chronic issue.  Patient would benefit from power wheelchair.  We will set up PT evaluation for this and check on how to best order this.

## 2023-02-25 NOTE — Progress Notes (Signed)
Marikay Alar, MD Phone: [phone]  Danny Friends Fass Sr. is a 72 y.o. male who presents today for f/u.   DIABETES Disease Monitoring: Blood Sugar ranges-not checking consistently though notes it was 127 this morning Polyuria/phagia/dipsia- notes polyuria      Optho- due Medications: Compliance- taking jardiance, trulicity though was out for a while Hypoglycemic symptoms- not often, drinks OJ when this occurs  COPD: Medication compliance- not on daily medication  Dyspnea- yes, chronic  Wheezing- occasional  Cough- no  Did have pneumonia in May.  Status post left lower extremity amputation: Patient notes he is having significant trouble getting around with his mechanical wheelchair.  He is requesting a power wheelchair.  Notes he does not have the strength and conditioning to use his arms to power the wheelchair.  PAD: Patient reports stents in his right leg.  He is not sure when he last saw vascular surgery for follow-up.  Social History   Tobacco Use  Smoking Status Former   Current packs/day: 0.00   Types: Cigarettes   Start date: 09/07/2009   Quit date: 09/07/2009   Years since quitting: 13.4  Smokeless Tobacco Never  Tobacco Comments   quit june 2011    Current Outpatient Medications on File Prior to Visit  Medication Sig Dispense Refill   acetaminophen (TYLENOL) 325 MG tablet Take 2 tablets (650 mg total) by mouth every 4 (four) hours as needed for mild pain (or temp > 37.5 C (99.5 F)).     albuterol (VENTOLIN HFA) 108 (90 Base) MCG/ACT inhaler Inhale 2 puffs into the lungs every 6 (six) hours as needed for wheezing or shortness of breath. 8 g 2   Albuterol Sulfate (PROAIR RESPICLICK) 108 (90 Base) MCG/ACT AEPB Inhale 1 puff into the lungs every 6 (six) hours as needed. 1 each 2   amLODipine (NORVASC) 10 MG tablet Take 1 tablet (10 mg total) by mouth daily. 90 tablet 1   atorvastatin (LIPITOR) 80 MG tablet Take 1 tablet (80 mg total) by mouth daily. 90 tablet 1    carvedilol (COREG) 25 MG tablet Take 1 tablet (25 mg total) by mouth 2 (two) times daily with a meal. 180 tablet 1   clopidogrel (PLAVIX) 75 MG tablet TAKE 1 TABLET BY MOUTH ONCE DAILY 30 tablet 0   Continuous Blood Gluc Receiver (FREESTYLE LIBRE 2 READER) DEVI 1 Device by Does not apply route 4 (four) times daily. 1 each 0   Continuous Glucose Sensor (FREESTYLE LIBRE 2 SENSOR) MISC Check blood sugars 6 each 3   Dulaglutide (TRULICITY) 4.5 MG/0.5ML SOPN Inject 4.5 mg into the skin once a week. 6 mL 1   empagliflozin (JARDIANCE) 25 MG TABS tablet Take 1 tablet (25 mg total) by mouth daily. 90 tablet 1   escitalopram (LEXAPRO) 10 MG tablet Take 1 tablet (10 mg total) by mouth daily. 90 tablet 1   nitroGLYCERIN (NITROSTAT) 0.4 MG SL tablet Place 0.4 mg under the tongue every 5 (five) minutes as needed for chest pain.     spironolactone (ALDACTONE) 25 MG tablet TAKE 1 TABLET (25 MG TOTAL) BY MOUTH DAILY. 90 tablet 1   tamsulosin (FLOMAX) 0.4 MG CAPS capsule Take 1 capsule (0.4 mg total) by mouth daily. 90 capsule 1   albuterol (PROVENTIL) (2.5 MG/3ML) 0.083% nebulizer solution Take 3 mLs (2.5 mg total) by nebulization every 6 (six) hours as needed for wheezing or shortness of breath. (Patient not taking: Reported on 02/25/2023) 150 mL 1   furosemide (LASIX) 20 MG  tablet Take 1 tablet (20 mg total) by mouth daily for 7 days. 7 tablet 0   No current facility-administered medications on file prior to visit.     ROS see history of present illness  Objective  Physical Exam Vitals:   02/25/23 1526  BP: 124/78  Pulse: 77  Temp: 97.6 F (36.4 C)  SpO2: 95%    BP Readings from Last 3 Encounters:  02/25/23 124/78  02/19/23 (!) 163/79  02/16/23 107/84   Wt Readings from Last 3 Encounters:  02/18/23 235 lb (106.6 kg)  02/16/23 235 lb (106.6 kg)  12/20/22 213 lb 10 oz (96.9 kg)    Physical Exam Constitutional:      General: He is not in acute distress.    Appearance: He is not diaphoretic.   Cardiovascular:     Rate and Rhythm: Normal rate and regular rhythm.     Heart sounds: Normal heart sounds.  Pulmonary:     Effort: Pulmonary effort is normal.     Breath sounds: Normal breath sounds.  Skin:    General: Skin is warm and dry.  Neurological:     Mental Status: He is alert.      Assessment/Plan: Please see individual problem list.  PVD (peripheral vascular disease) (HCC) Assessment & Plan: Chronic issue.  Refer to vascular surgery for follow-up.  Orders: -     Ambulatory referral to Vascular Surgery  Pulmonary emphysema, unspecified emphysema type (HCC) Assessment & Plan: Chronic issue.  Patient will start Anoro 1 puff daily.  We will see how he does with his breathing once starting on this.   Type 2 diabetes mellitus with hyperlipidemia (HCC) Assessment & Plan: Chronic issue.  Check A1c.  Patient will resume Trulicity 4.5 mg weekly and Jardiance 25 mg daily.  Orders: -     Ambulatory referral to Ophthalmology -     Hemoglobin A1c -     Basic metabolic panel  Hx of BKA, left (HCC) Assessment & Plan: Chronic issue.  Patient would benefit from power wheelchair.  We will set up PT evaluation for this and check on how to best order this.  Orders: -     Ambulatory referral to Physical Therapy    Return in about 2 months (around 04/28/2023).   Marikay Alar, MD [facility] Primary Care [facility]

## 2023-02-26 ENCOUNTER — Emergency Department: Payer: 59

## 2023-02-26 ENCOUNTER — Other Ambulatory Visit: Payer: Self-pay

## 2023-02-26 ENCOUNTER — Ambulatory Visit: Payer: Self-pay | Admitting: *Deleted

## 2023-02-26 DIAGNOSIS — M79604 Pain in right leg: Secondary | ICD-10-CM | POA: Diagnosis not present

## 2023-02-26 DIAGNOSIS — N179 Acute kidney failure, unspecified: Secondary | ICD-10-CM

## 2023-02-26 DIAGNOSIS — R69 Illness, unspecified: Secondary | ICD-10-CM | POA: Diagnosis not present

## 2023-02-26 DIAGNOSIS — M19071 Primary osteoarthritis, right ankle and foot: Secondary | ICD-10-CM | POA: Diagnosis not present

## 2023-02-26 DIAGNOSIS — N289 Disorder of kidney and ureter, unspecified: Secondary | ICD-10-CM | POA: Diagnosis not present

## 2023-02-26 DIAGNOSIS — Z743 Need for continuous supervision: Secondary | ICD-10-CM | POA: Diagnosis not present

## 2023-02-26 DIAGNOSIS — S90851A Superficial foreign body, right foot, initial encounter: Secondary | ICD-10-CM | POA: Diagnosis not present

## 2023-02-26 DIAGNOSIS — Z7401 Bed confinement status: Secondary | ICD-10-CM | POA: Diagnosis not present

## 2023-02-26 DIAGNOSIS — M7121 Synovial cyst of popliteal space [Baker], right knee: Secondary | ICD-10-CM | POA: Diagnosis not present

## 2023-02-26 DIAGNOSIS — R600 Localized edema: Secondary | ICD-10-CM | POA: Diagnosis not present

## 2023-02-26 DIAGNOSIS — M7989 Other specified soft tissue disorders: Secondary | ICD-10-CM | POA: Diagnosis not present

## 2023-02-26 DIAGNOSIS — M7731 Calcaneal spur, right foot: Secondary | ICD-10-CM | POA: Diagnosis not present

## 2023-02-26 LAB — BASIC METABOLIC PANEL
BUN: 36 mg/dL — ABNORMAL HIGH (ref 6–23)
CO2: 28 mEq/L (ref 19–32)
Calcium: 9.8 mg/dL (ref 8.4–10.5)
Chloride: 102 mEq/L (ref 96–112)
Creatinine, Ser: 1.26 mg/dL (ref 0.40–1.50)
GFR: 57.15 mL/min — ABNORMAL LOW (ref >60.00)
Glucose, Bld: 219 mg/dL — ABNORMAL HIGH (ref 70–99)
Potassium: 4.4 mEq/L (ref 3.5–5.1)
Sodium: 139 mEq/L (ref 135–145)

## 2023-02-26 LAB — HEMOGLOBIN A1C: Hgb A1c MFr Bld: 10.1 % — ABNORMAL HIGH (ref 4.6–6.5)

## 2023-02-26 NOTE — Discharge Instructions (Addendum)
Please follow-up with both Dr. Wyn Quaker (for your right leg swelling) and also with Dr. [name].  Please call both of your doctors offices later today to set up follow-up appointments.  Recommend he follow-up with Dr. [name] this week and I have sent him a message requesting follow-up as well.  I would recommend that you increase your water intake to about 1 to 2 glasses of water a day for the next few days as you appear to be slightly dehydrated on your laboratory work today.  Return to the ER right away if you develop cold or blue foot, fever, redness in the leg, severe pain, difficulty breathing, fevers, weakness, difficulty urinating, or other new concerns arise.

## 2023-02-26 NOTE — ED Notes (Signed)
Pt provided a sandwich tray and diet ginger ale.

## 2023-02-26 NOTE — ED Notes (Signed)
called to [facility] for transport home/rep:blanca.

## 2023-02-26 NOTE — ED Notes (Signed)
US at the bedside

## 2023-02-27 ENCOUNTER — Telehealth: Payer: Self-pay

## 2023-02-27 ENCOUNTER — Telehealth: Payer: Self-pay | Admitting: Family Medicine

## 2023-02-27 NOTE — Telephone Encounter (Signed)
Patient need lab orders.

## 2023-02-27 NOTE — Progress Notes (Signed)
Printed his other insurance card and Dr. Purvis Sheffield office notes and faxed to [employer] and received an okay confirmation.

## 2023-02-27 NOTE — Addendum Note (Signed)
Addended by: [name], Ryin Ambrosius G on: 02/27/2023 01:11 PM   Modules accepted: Orders

## 2023-02-27 NOTE — Telephone Encounter (Signed)
Orders were written though they were not placed future.  These were changed.

## 2023-02-27 NOTE — Patient Instructions (Signed)
Visit Information  Thank you for taking time to visit with me today. Please don't hesitate to contact me if I can be of assistance to you.   Following are the goals we discussed today:  Request form for personal care services faxed to your provider for completion  Please follow up with PCP regarding electric wheelchair request    Our next appointment is by telephone on 03/05/22 at 2pm  Please call the care guide team at [phone] if you need to cancel or reschedule your appointment.   If you are experiencing a Mental Health or Behavioral Health Crisis or need someone to talk to, please call 911   Patient verbalizes understanding of instructions and care plan provided today and agrees to view in MyChart. Active MyChart status and patient understanding of how to access instructions and care plan via MyChart confirmed with patient.     Telephone follow up appointment with care management team member scheduled for: 03/06/23  Verna Czech, LCSW Clinical Social Worker  [facility] Care Management [phone]

## 2023-02-27 NOTE — Telephone Encounter (Signed)
Faxed electric wheel chair order to [employer] and received an okay confirmation.

## 2023-02-27 NOTE — Patient Outreach (Signed)
  Care Coordination   Initial Visit Note   02/27/2023 Late Entry Name: Danny Pipkins Crosson Sr. MRN: [id number] DOB: 10/14/1950  Danny Friends Hoke Sr. is a 72 y.o. year old male who sees [name], Yehuda Mao, MD for primary care. I spoke with  Danny Scrape Sr.'s son  by phone on 02/26/23.  What matters to the patients health and wellness today?  In home care assistance and DME    Goals Addressed             This Visit's Progress    In home assistance       Interventions Today    Flowsheet Row Most Recent Value  Chronic Disease   Chronic disease during today's visit Chronic Obstructive Pulmonary Disease (COPD)  General Interventions   General Interventions Discussed/Reviewed General Interventions Discussed, Community Resources, Level of Care  Doctor Visits Discussed/Reviewed Doctor Visits Discussed, PCP  [PCP 04/29/23]  Durable Medical Equipment (DME) Wheelchair  [Pt's son confirms working with PCP re: Passenger transport manager, HHPT ordered to assess]  Level of Care Personal Care Services  [patient's son confirmed that he is patient's primary caregiver -however patient has difficulty completing  his ADl's -requesting assistance in the home-request form for PCS services faxed to provider for completion]  Education Interventions   Education Provided Provided Education  Provided Verbal Education On [employer] application process]              SDOH assessments and interventions completed:  Yes  SDOH Interventions Today    Flowsheet Row Most Recent Value  SDOH Interventions   Food Insecurity Interventions Intervention Not Indicated  Housing Interventions Intervention Not Indicated  Transportation Interventions Intervention Not Indicated        Care Coordination Interventions:  Yes, provided   Follow up plan: Follow up call scheduled for 03/06/23    Encounter Outcome:  Pt. Visit Completed

## 2023-03-01 ENCOUNTER — Other Ambulatory Visit (INDEPENDENT_AMBULATORY_CARE_PROVIDER_SITE_OTHER): Payer: 59

## 2023-03-01 DIAGNOSIS — N179 Acute kidney failure, unspecified: Secondary | ICD-10-CM | POA: Diagnosis not present

## 2023-03-01 NOTE — Addendum Note (Signed)
Addended by: Warden Fillers on: 03/01/2023 10:15 AM   Modules accepted: Orders

## 2023-03-01 NOTE — Telephone Encounter (Signed)
Theodoro Grist from [employer] called wanting to talk to the referral coordinator about pt wheelchair. They are in not network with the pt insurance  [phone]

## 2023-03-04 ENCOUNTER — Telehealth: Payer: Self-pay

## 2023-03-04 NOTE — Telephone Encounter (Signed)
Transition Care Management Unsuccessful Follow-up Telephone Call  Date of discharge and from where:  02/26/2023 [facility]  Attempts:  1st Attempt  Reason for unsuccessful TCM follow-up call:  Unable to reach patient  Shavontae Gibeault [facility]  [facility] Population Health Community Resource Care Guide   [email or web address]  ?? [phone]   Website: [email or web address]  [email or web address]

## 2023-03-05 ENCOUNTER — Telehealth: Payer: Self-pay

## 2023-03-05 NOTE — Telephone Encounter (Signed)
-----   Message from Marikay Alar sent at 03/01/2023  4:55 PM EDT ----- Noted. Patient should try to increase his water and shoot for 32 oz daily.

## 2023-03-05 NOTE — Telephone Encounter (Signed)
Transition Care Management Unsuccessful Follow-up Telephone Call  Date of discharge and from where:  02/26/2023 [facility]  Attempts:  2nd Attempt  Reason for unsuccessful TCM follow-up call:  Unable to reach patient  Atheena Spano [facility]  [facility] Population Health Community Resource Care Guide   [email or web address]  ?? [phone]   Website: [email or web address]  [email or web address]

## 2023-03-06 ENCOUNTER — Ambulatory Visit: Payer: Self-pay | Admitting: *Deleted

## 2023-03-06 NOTE — Patient Instructions (Signed)
Visit Information  Thank you for taking time to visit with me today. Please don't hesitate to contact me if I can be of assistance to you.   Following are the goals we discussed today:  Please continue to work with your PCP's office regarding the status of the motorized wheelchair Pleas anticipate a call form [facility] to schedule initial appointment for in  home care   Our next appointment is by telephone on 03/19/23 at 1pm  Please call the care guide team at [phone] if you need to cancel or reschedule your appointment.   If you are experiencing a Mental Health or Behavioral Health Crisis or need someone to talk to, please call 911   Patient verbalizes understanding of instructions and care plan provided today and agrees to view in MyChart. Active MyChart status and patient understanding of how to access instructions and care plan via MyChart confirmed with patient.     Telephone follow up appointment with care management team member scheduled for: 03/19/23   Verna Czech, LCSW Clinical Social Worker  [facility] Care Management [phone]

## 2023-03-06 NOTE — Patient Outreach (Signed)
  Care Coordination   Follow Up Visit Note   03/06/2023 Name: Danny Fiorentino Friedel Sr. MRN: [id number] DOB: 1951/03/18  Danny Friends Hartung Sr. is a 72 y.o. year old male who sees [name], Yehuda Mao, MD for primary care. I spoke with  Danny Scrape Sr. 's son by phone today.  What matters to the patients health and wellness today?  In home care for patient to assist with ADL's    Goals Addressed             This Visit's Progress    In home assistance       Interventions Today    Flowsheet Row Most Recent Value  Chronic Disease   Chronic disease during today's visit Chronic Obstructive Pulmonary Disease (COPD)  General Interventions   General Interventions Discussed/Reviewed General Interventions Reviewed, Community Resources, Level of Care  Doctor Visits Discussed/Reviewed Doctor Visits Discussed, PCP  [PCP 04/29/23]  Durable Medical Equipment (DME) Wheelchair  Wheelchair Motorized  [Son confirms that he is currenlty working with patient's PCP regarding wheelchair order]  Level of Care Personal Care Services  [completed request form received by PCP and faxed to [facility] for review,[phone] son to expect a phone call from them to schedule in home assessment for in home care]              SDOH assessments and interventions completed:  No     Care Coordination Interventions:  Yes, provided   Follow up plan: Follow up call scheduled for 03/18/23    Encounter Outcome:  Pt. Visit Completed

## 2023-03-07 ENCOUNTER — Telehealth: Payer: Self-pay | Admitting: *Deleted

## 2023-03-07 NOTE — Progress Notes (Signed)
  Care Coordination Note  03/07/2023 Name: Danny Landesman Hannay Sr. MRN: [id number] DOB: 03-26-1951  Danny Friends Gielow Sr. is a 72 y.o. year old male who is a primary care patient of [name], MD and is actively engaged with the care management team. I reached out to [employer] Sr. by phone today to assist with re-scheduling a follow up visit with the RN Case Manager  Follow up plan: Unsuccessful telephone outreach attempt made. A HIPAA compliant phone message was left for the patient providing contact information and requesting a return call.   [name], CCMA Care Coordination Care Guide Direct Dial: [phone]

## 2023-03-08 ENCOUNTER — Encounter: Payer: Self-pay | Admitting: *Deleted

## 2023-03-08 NOTE — Patient Outreach (Signed)
  Care Coordination   Care Coordination Phone call-   Note   03/08/2023 Name: Danny Tuminello Raneri Sr. MRN: [id number] DOB: 1950-10-25  Danny Friends Lobdell Sr. is a 72 y.o. year old male who sees [name], Yehuda Mao, MD for primary care. I  contacted [facility] to follow up on status of request form for an initial assessment for personal care services  What matters to the patients health and wellness today?  In home care services to assist patient with ADL's.    Goals Addressed             This Visit's Progress    In home assistance       Interventions Today    Flowsheet Row Most Recent Value  Chronic Disease   Chronic disease during today's visit Chronic Obstructive Pulmonary Disease (COPD)  General Interventions   General Interventions Discussed/Reviewed Communication with, Level of Care  Communication with --  [[facility] confirmed that the request form for an initial assessment for persoonal care services has been received and is currently in review. Per agent, application can be in reivew for a couple of days before they will reach out to patient to schedule]  Level of Care Personal Care Services              SDOH assessments and interventions completed:  No     Care Coordination Interventions:  Yes, provided   Follow up plan: Follow up call scheduled for 03/19/23    Encounter Outcome:  Pt. Visit Completed

## 2023-03-08 NOTE — Telephone Encounter (Signed)
This encounter was created in error - please disregard.

## 2023-03-19 ENCOUNTER — Ambulatory Visit: Payer: Self-pay | Admitting: *Deleted

## 2023-03-19 NOTE — Patient Instructions (Signed)
Visit Information  Thank you for taking time to visit with me today. Please don't hesitate to contact me if I can be of assistance to you.   Following are the goals we discussed today:  Please follow up with [facility] regarding status of request for personal care services   Our next appointment is by telephone on 03/26/23 at 10am  Please call the care guide team at [phone] if you need to cancel or reschedule your appointment.   If you are experiencing a Mental Health or Behavioral Health Crisis or need someone to talk to, please call 911   Patient verbalizes understanding of instructions and care plan provided today and agrees to view in MyChart. Active MyChart status and patient understanding of how to access instructions and care plan via MyChart confirmed with patient.     Telephone follow up appointment with care management team member scheduled for: 03/26/23   Verna Czech, LCSW Clinical Social Worker  [facility] Care Management [phone]

## 2023-03-19 NOTE — Patient Outreach (Signed)
  Care Coordination   Follow Up Visit Note   03/19/2023 Name: Danny Solazzo Marner Sr. MRN: [id number] DOB: 05/12/1951  Danny Friends Schmieg Sr. is a 72 y.o. year old male who sees [name], Yehuda Mao, MD for primary care. I spoke with  Danny Scrape Sr.'s son by phone today.  What matters to the patients health and wellness today?  In home care assistance to assist with ADL's. Assessment completed on 03/15/23, final determination to be provided to patient by mail.    Goals Addressed             This Visit's Progress    In home assistance       Interventions Today    Flowsheet Row Most Recent Value  Chronic Disease   Chronic disease during today's visit Chronic Obstructive Pulmonary Disease (COPD)  General Interventions   General Interventions Discussed/Reviewed General Interventions Reviewed, [employer], Horticulturist, commercial (DME)  Doctor Visits Discussed/Reviewed Doctor Visits Reviewed  [8/19 pharmacist, 9/23 PCP]  Durable Medical Equipment (DME) Wheelchair  Wheelchair Motorized  [Wheelchair evaluation scheduled for 8/30]  Level of Care Personal Care Services  [facility] contacted, confirmed that evaluation for personal care services was completed on 03/15/23. Final determination to be provided to patient by mail]              SDOH assessments and interventions completed:  No     Care Coordination Interventions:  Yes, provided   Follow up plan: Follow up call scheduled for 03/26/23    Encounter Outcome:  Pt. Visit Completed

## 2023-03-21 ENCOUNTER — Encounter (INDEPENDENT_AMBULATORY_CARE_PROVIDER_SITE_OTHER): Payer: Self-pay

## 2023-03-25 ENCOUNTER — Other Ambulatory Visit: Payer: 59 | Admitting: Pharmacist

## 2023-03-25 DIAGNOSIS — E785 Hyperlipidemia, unspecified: Secondary | ICD-10-CM

## 2023-03-25 DIAGNOSIS — R531 Weakness: Secondary | ICD-10-CM | POA: Diagnosis not present

## 2023-03-25 MED ORDER — FREESTYLE LIBRE 2 SENSOR MISC
3 refills | Status: AC
Start: 2023-03-25 — End: ?

## 2023-03-25 NOTE — Progress Notes (Signed)
03/25/2023 Name: Danny Eisenman Chio Sr. MRN: [id number] DOB: [birth year]  Chief Complaint  Patient presents with   Medication Management    Danny Friends Spoon Sr. is a 72 y.o. year old male who presented for a telephone visit.   They were referred to the pharmacist by their PCP for assistance in managing diabetes, hypertension, hyperlipidemia, and medication access.    Subjective:  Care Team: Primary Care Provider: [name], MD ; Next Scheduled Visit: 04/29/23  Medication Access/Adherence  Current Pharmacy:  [facility], [place] - [address] [place] [address] Phone: [phone] Fax: [phone]  [facility] - [place], [place] - [address] [place] [address] Phone: [phone] Fax: [phone]   Patient reports affordability concerns with their medications: Yes  - reports the $6 packaging fee is difficult for them to consistently pay.  Patient reports access/transportation concerns to their pharmacy: Yes  - Danny [name] notes some difficulty in consistently getting to the pharmacy Patient reports adherence concerns with their medications:  No    Notes that they do like getting the adherence packaging, but cannot continue to afford the packaging fee.   Diabetes:  Current medications: Jardiance 25 mg daily, Trulicity 4.5 mg weekly  Medications tried in the past: metformin - diarrhea/stomach upset on XR   Hyperlipidemia/ASCVD Risk Reduction- Peripheral Vascular Disease   Current lipid lowering medications: atorvastatin 80 mg daily   Antiplatelet regimen: aspirin 81 mg daily, clopidogrel 75 mg daily   Heart Failure:  Current medications:  ACEi/ARB/ARNI: Entresto held due to prior AKI SGLT2i: Jardiance 25 mg daily Beta blocker: carvedilol 25 mg twice daily  Mineralocorticoid Receptor Antagonist: spironolactone 25 mg daily  Diuretic regimen: furosemide 20 mg PRN   COPD:  Current medications: prescribed  Anoro last month, though patient's son notes he does not know where the inhaler is. Has PRN albuterol at home that he uses as needed.   Fill history shows that a 3 month supply was filled in July at [facility]    Objective:  Lab Results  Component Value Date   HGBA1C 10.1 (H) 02/25/2023    Lab Results  Component Value Date   CREATININE 1.10 03/01/2023   BUN 29 (H) 03/01/2023   NA 141 03/01/2023   K 4.6 03/01/2023   CL 105 03/01/2023   CO2 20 03/01/2023    Lab Results  Component Value Date   CHOL 136 09/12/2022   HDL 55.70 09/12/2022   LDLCALC 59 09/12/2022   LDLDIRECT 64.0 03/20/2018   TRIG 103.0 09/12/2022   CHOLHDL 2 09/12/2022    Medications Reviewed Today     Reviewed by Alden Hipp, RPH-CPP (Pharmacist) on 03/25/23 at 1308  Med List Status: <None>   Medication Order Taking? Sig Documenting Provider Last Dose Status Informant  acetaminophen (TYLENOL) 325 MG tablet [id number] No Take 2 tablets (650 mg total) by mouth every 4 (four) hours as needed for mild pain (or temp > 37.5 C (99.5 F)).  Patient not taking: Reported on 03/25/2023   Dorcas Carrow, MD Not Taking Active Pharmacy Records  albuterol (PROVENTIL) (2.5 MG/3ML) 0.083% nebulizer solution [id number] No Take 3 mLs (2.5 mg total) by nebulization every 6 (six) hours as needed for wheezing or shortness of breath.  Patient not taking: Reported on 02/25/2023   [name], MD Not Taking Active Pharmacy Records  albuterol [facility]) 108 [phone] Base) MCG/ACT inhaler [id number] Yes Inhale  2 puffs into the lungs every 6 (six) hours as needed for wheezing or shortness of breath. Delton Prairie, MD Taking Active   Albuterol Sulfate (PROAIR RESPICLICK) 108 (90 Base) MCG/ACT AEPB [id number]  Inhale 1 puff into the lungs every 6 (six) hours as needed. [name], MD  Active Pharmacy Records  amLODipine [facility]) 10 MG tablet [id number] Yes Take 1 tablet (10 mg total) by mouth daily. [name], MD  Taking Active   aspirin EC 81 MG tablet [id number] Yes Take 1 tablet (81 mg total) by mouth daily. [name], MD Taking Active   atorvastatin (LIPITOR) 80 MG tablet [id number] Yes Take 1 tablet (80 mg total) by mouth daily. [name], MD Taking Active   carvedilol (COREG) 25 MG tablet [id number] Yes Take 1 tablet (25 mg total) by mouth 2 (two) times daily with a meal. [name], MD Taking Active   clopidogrel (PLAVIX) 75 MG tablet [id number] Yes TAKE 1 TABLET BY MOUTH ONCE DAILY Dew, Marlow Baars, MD Taking Active   Continuous Blood Gluc Receiver (FREESTYLE LIBRE 2 READER) DEVI [id number] Yes 1 Device by Does not apply route 4 (four) times daily. [name], MD Taking Active Pharmacy Records  Continuous Glucose Sensor ([address] [place] [address] [place]) [place] [id number] Yes Check blood sugars [name], MD Taking Active   Dulaglutide (TRULICITY) 4.5 MG/0.5ML Namon Cirri [id number] Yes Inject 4.5 mg into the skin once a week. [name], MD Taking Active Pharmacy Records  empagliflozin (JARDIANCE) 25 MG TABS tablet [id number] Yes Take 1 tablet (25 mg total) by mouth daily. [name], MD Taking Active   escitalopram (LEXAPRO) 10 MG tablet [id number] Yes Take 1 tablet (10 mg total) by mouth daily. [name], MD Taking Active   furosemide (LASIX) 20 MG tablet [id number] No Take 1 tablet (20 mg total) by mouth daily for 7 days.  Patient not taking: Reported on 03/25/2023   Dionne Bucy, MD Not Taking Expired 02/23/23 2359   nitroGLYCERIN (NITROSTAT) 0.4 MG SL tablet [id number]  Place 0.4 mg under the tongue every 5 (five) minutes as needed for chest pain. [provider]  Active Pharmacy Records  spironolactone (ALDACTONE) 25 MG tablet [id number] Yes TAKE 1 TABLET (25 MG TOTAL) BY MOUTH DAILY. [name], MD Taking Active   tamsulosin [facility]) 0.4 MG CAPS capsule [id number] No Take 1 capsule (0.4 mg total) by mouth daily.  Patient not taking:  Reported on 03/25/2023   [name], MD Not Taking Active   umeclidinium-vilanterol [facility] ELLIPTA) 62.5-25 MCG/ACT AEPB [id number] Yes Inhale 1 puff into the lungs daily. [name], MD Taking Active               Assessment/Plan:   Medication Management: - Patient with benefit with adherence packaging, but cost concerns with packaging fee. Discussed transferring scripts to [facility] for free adherence packaging and delivery. Due to prior supply of medications filled at [facility], patient is not quite due for refills. Discussed with Danny Maduro. They will continue at [facility] for 2 more fills until due for refills, and then we will transfer to [facility] for packaging and mail order.    Diabetes: - Unable to discuss blood sugar control today due to focus on medication access/adherence. Recommend to continue current regimen at this time.   Hyperlipidemia/ASCVD Risk Reduction- Peripheral Vascular Disease  - Appropriately managed - Recommend to continue current regimen at this time  Heart Failure: - Appropriately managed -  Recommend to continue current regimen at this time  COPD: - Uncontrolled due to misplacing inhaler. Encouraged Danny [name] and patient to look around the house for the Anoro inhalers, as they were filled last month for a 3 month supply. Reviewed maintenance vs rescue inhalers.    Follow Up Plan: phone call in 4 weeks   Catie TClearance Coots, PharmD, BCACP, CPP Clinical Pharmacist [facility] [phone]

## 2023-03-25 NOTE — Patient Instructions (Addendum)
Danny [name],   Let's continue getting your medications from [facility] in the pill packages until they finish the supply they have, and then we can transfer to the [employer].   See if you can find the Anoro anywhere     Catie TClearance Coots, PharmD, BCACP, CPP Clinical Pharmacist [facility] [phone]

## 2023-03-26 ENCOUNTER — Ambulatory Visit: Payer: Self-pay | Admitting: *Deleted

## 2023-03-26 NOTE — Patient Instructions (Signed)
Visit Information  Thank you for taking time to visit with me today. Please don't hesitate to contact me if I can be of assistance to you.   Following are the goals we discussed today:  Please contact [facility] regarding confirmation letter related to approval for in home care [phone] Please continue to work with the pharmacist regarding transferring prescriptions to [facility] for packaging and mail order.    Our next appointment is by telephone on 04/09/23 at 11am  Please call the care guide team at [phone] if you need to cancel or reschedule your appointment.   If you are experiencing a Mental Health or Behavioral Health Crisis or need someone to talk to, please call [phone]   Patient verbalizes understanding of instructions and care plan provided today and agrees to view in MyChart. Active MyChart status and patient understanding of how to access instructions and care plan via MyChart confirmed with patient.     Telephone follow up appointment with care management team member scheduled for: 04/09/23  Verna Czech, LCSW Clinical Social Worker  [phone]

## 2023-03-26 NOTE — Patient Outreach (Signed)
  Care Coordination   Follow Up Visit Note   03/26/2023 Name: Danny Ooten Swango Sr. MRN: [id number] DOB: 1950-08-14  Danny Friends Sutphin Sr. is a 72 y.o. year old male who sees [name], Yehuda Mao, MD for primary care. I spoke with  Danny Scrape Sr. 's son by phone today.  What matters to the patients health and wellness today?  In home care to assist with ADL's and affordable medication options    Goals Addressed             This Visit's Progress    In home assistance       Interventions Today    Flowsheet Row Most Recent Value  Chronic Disease   Chronic disease during today's visit Chronic Obstructive Pulmonary Disease (COPD)  General Interventions   General Interventions Discussed/Reviewed General Interventions Discussed, General Interventions Reviewed, Level of Care  Doctor Visits Discussed/Reviewed PCP  [PCP 9/23]  Level of Care --  [patient's son states that he has not received any confirmation in the mail regarding approval for aid personal care services to assist with patient's daily care needs, agreeable to contacting [facility] if letter is not received]  Pharmacy Interventions   Pharmacy Dicussed/Reviewed Pharmacy Topics Discussed  Charlyne Mom' son discussed difficulty affording pill pakcaging-currently working with pharmacist to transfer to [facility] for free pill packaging and mail order]              SDOH assessments and interventions completed:  No     Care Coordination Interventions:  Yes, provided   Follow up plan: Follow up call scheduled for 04/09/23    Encounter Outcome:  Pt. Visit Completed

## 2023-04-05 ENCOUNTER — Other Ambulatory Visit: Payer: Self-pay

## 2023-04-05 ENCOUNTER — Ambulatory Visit: Payer: 59

## 2023-04-05 DIAGNOSIS — Z89512 Acquired absence of left leg below knee: Secondary | ICD-10-CM | POA: Diagnosis not present

## 2023-04-05 DIAGNOSIS — M6281 Muscle weakness (generalized): Secondary | ICD-10-CM | POA: Insufficient documentation

## 2023-04-05 DIAGNOSIS — R2689 Other abnormalities of gait and mobility: Secondary | ICD-10-CM | POA: Insufficient documentation

## 2023-04-05 NOTE — Therapy (Signed)
OUTPATIENT PHYSICAL THERAPY WHEELCHAIR EVALUATION   Patient Name: Danny SANTALUCIA Sr. MRN: [id number] DOB:12-16-1950, 72 y.o., male Today's Date: 04/05/2023  END OF SESSION:  PT End of Session - 04/05/23 1315     Visit Number 1    Number of Visits 1    PT Start Time 1315    PT Stop Time 1355    PT Time Calculation (min) 40 min    Activity Tolerance Patient tolerated treatment well    Behavior During Therapy WFL for tasks assessed/performed             Past Medical History:  Diagnosis Date   CAD (coronary artery disease)    a. 01/2010 PCI of LAD; b. 09/2014 PCI/DES of mLAD due to ISR. D1 80, D2 80(jailed), LCX 46m; c. 07/2016 NSTEMI/Cath: LM nl, LAD 49m ISR, 40d, D1 90ost, 80p, D2 90ost, RI min irregs, LCX min irregs, OM1 90 small, OM2/3 min irregs, RCA min irregs, RPLB1 90, EF 25-35%.   Chronic combined systolic and diastolic CHF (congestive heart failure) (HCC)    a. 07/2016 Echo: EF 30%, severe septal/anterior HK, Gr1 DD.   CKD (chronic kidney disease), stage III (HCC)    COPD (chronic obstructive pulmonary disease) (HCC)    DM2 (diabetes mellitus, type 2) (HCC)    Erectile dysfunction    HLD (hyperlipidemia)    HTN (hypertension)    Ischemic cardiomyopathy    a. 07/2016 Echo: EF 30% w/ sev septal/ant HK. Gr1 DD.   Past Surgical History:  Procedure Laterality Date   AMPUTATION Left 11/10/2020   Procedure: AMPUTATION BELOW KNEE;  Surgeon: Annice Needy, MD;  Location: [facility];  Service: General;  Laterality: Left;   BELOW KNEE LEG AMPUTATION     CAD: stent to the LAD     CARDIAC CATHETERIZATION  10/01/2014   CARDIAC CATHETERIZATION N/A 07/23/2016   Procedure: Left Heart Cath and Coronary Angiography;  Surgeon: Iran Ouch, MD;  Location: [facility];  Service: Cardiovascular;  Laterality: N/A;   CORONARY ANGIOPLASTY WITH STENT PLACEMENT  10/01/2014   LOWER EXTREMITY ANGIOGRAPHY Left 10/31/2020   Procedure: LOWER EXTREMITY ANGIOGRAPHY;  Surgeon: Annice Needy,  MD;  Location: [facility];  Service: Cardiovascular;  Laterality: Left;   LOWER EXTREMITY ANGIOGRAPHY Right 11/20/2021   Procedure: Lower Extremity Angiography;  Surgeon: Annice Needy, MD;  Location: [facility];  Service: Cardiovascular;  Laterality: Right;   Patient Active Problem List   Diagnosis Date Noted   Erythrocytosis 12/20/2022   Unable to care for self 09/23/2022   Non-traumatic rhabdomyolysis 09/23/2022   Candidal intertrigo 05/11/2022   ICH (intracerebral hemorrhage) (HCC) 09/30/2021   Hx of BKA, left (HCC) 12/16/2020   Chronic systolic heart failure (HCC)    Cellulitis of left foot 11/09/2020   HFrEF (heart failure with reduced ejection fraction) (HCC)    PVD (peripheral vascular disease) (HCC)    Low back pain 06/29/2020   Onychomycosis 03/29/2020   Fall 03/29/2020   Coronary artery disease of native artery of native heart with stable angina pectoris (HCC) 12/12/2018   Pulmonary emphysema (HCC) 12/12/2018   Pure hypercholesterolemia 12/12/2018   Atherosclerosis of native arteries of extremity with intermittent claudication (HCC) 02/11/2018   Decreased pedal pulses 07/02/2017   Orthostasis 05/07/2017   Anxiety and depression 05/07/2017   CKD (chronic kidney disease) stage 3, GFR 30-59 ml/min (HCC) 03/06/2017   Diabetic neuropathy (HCC) 03/06/2017   Chronic diastolic CHF (congestive heart failure) (  HCC) 03/05/2017   Morbid obesity (HCC) 06/18/2016   H/O medication noncompliance 06/18/2016   GERD (gastroesophageal reflux disease) 12/20/2014   Type 2 diabetes mellitus with hyperlipidemia (HCC) 06/05/2010   Hyperlipidemia 02/10/2010   Essential hypertension 02/10/2010   CAD (coronary artery disease) 02/10/2010    PCP: Marikay Alar, MD  REFERRING PROVIDER: [name], MD  THERAPY DIAG:  Other abnormalities of gait and mobility  Muscle weakness (generalized)  Hx of BKA, left (HCC)  Rationale for Evaluation and Treatment  Rehabilitation  SUBJECTIVE:                                                                                                                                                                                           SUBJECTIVE STATEMENT: Pt presents for wheelchair evaluation. Status post left lower extremity amputation. Pt had L BKA done about 2 years. Pt is currently not using prosthesis as he has trouble don/doff prosthesis. He has difficulty with walking due to SOB due to COPD. His son present subjective interview.  Patient notes he is having significant trouble getting around with his mechanical wheelchair. He is requesting a power wheelchair. Notes he does not have the strength and conditioning to use his arms to power the wheelchair. Pt is on 2L of O2 at night time. Currently not using during the day as he is pretty sedentary due to decreased strength and conditioning.Pt is currently using manual wheelchair but left wheel tire has slipped off and brakes are no longer functioning. Pt's current wheelchair is about 71 years old.  Pt also has PMH of CKD, CHF, COPD, and hx of BKA that limits his endurance and function at home.  PRECAUTIONS: Fall  RED FLAGS: None  WEIGHT BEARING RESTRICTIONS No    OCCUPATION: retired  PLOF:  Needs assistance with ADLs, Needs assistance with homemaking, Needs assistance with gait, and Needs assistance with transfers  PATIENT GOALS: Obtain power wheelchair to improve mobility within home         MEDICAL HISTORY:  Primary diagnosis onset: 02/25/2023 - date of referral     Medical Diagnosis with ICD-10 code: Z89.512 (ICD-10-CM) - Hx of BKA, left (HCC)   [] Progressive disease  Relevant future surgeries:     Height: 6' Weight: 235 lbs Explain recent changes or trends in weight:      History:  Past Medical History:  Diagnosis Date   CAD (coronary artery disease)    a. 01/2010 PCI of LAD; b. 09/2014 PCI/DES of mLAD due to ISR. D1 80, D2 80(jailed), LCX 49m;  c. 07/2016 NSTEMI/Cath: LM nl, LAD 69m ISR, 40d, D1 90ost, 80p, D2 90ost,  RI min irregs, LCX min irregs, OM1 90 small, OM2/3 min irregs, RCA min irregs, RPLB1 90, EF 25-35%.   Chronic combined systolic and diastolic CHF (congestive heart failure) (HCC)    a. 07/2016 Echo: EF 30%, severe septal/anterior HK, Gr1 DD.   CKD (chronic kidney disease), stage III (HCC)    COPD (chronic obstructive pulmonary disease) (HCC)    DM2 (diabetes mellitus, type 2) (HCC)    Erectile dysfunction    HLD (hyperlipidemia)    HTN (hypertension)    Ischemic cardiomyopathy    a. 07/2016 Echo: EF 30% w/ sev septal/ant HK. Gr1 DD.       Cardio Status:  Functional Limitations:   [] Intact  [x]  Impaired      Respiratory Status:  Functional Limitations:   [] Intact  [x] Impaired   [x] SOB [x] COPD [x] O2 Dependent __2____LPM  [] Ventilator Dependent  Resp equip:                                                     Objective Measure(s):   Orthotics:   [] Amputee:                                                             [x] Prosthesis: L BKA       HOME ENVIRONMENT:  [x] House [] Condo/town home [] Apartment [] Asst living [] LTCF         [] Own  [] Rent   [] Lives alone [] Lives with others -                             Hours without assistance:   [] Home is accessible to patient                                 Storage of wheelchair:  [] In home   [] Other Comments:        COMMUNITY :  TRANSPORTATION:  [x] Car [] Print production planner [] Adapted w/c Lift []  Ambulance [] Other:                     [] Sits in wheelchair during transport   Where is w/c stored during transport?  [x] [place]  []  [employer]  r   [] Self-Driver       Drive while in  Biomedical scientist [] yes [x] no   Employment and/or school:  Specific requirements pertaining to mobility        Other:  COMMUNICATION:  Verbal Communication  [x] WFL [] receptive [] WFL [] expressive [] Understandable  [] Difficult to understand  [] non-communicative  Primary  Language:___English___________ 2nd:_____________  Communication provided by:[x] Patient [x] Family [] Caregiver [] Translator   [] Uses an augmentative communication device     Manufacturer/Model :                                                                MOBILITY/BALANCE:  Sitting Balance  Standing Balance  Transfers  Ambulation   [x] WFL      [] WFL  [] Independent  []  Independent   [] Uses UE for balance in sitting Comments:  [] Uses UE/device for stability Comments:  []  Min assist  []  Ambulates independently with       device:___________________      [x]  Mod assist  []  Able to ambulate ______ feet        safely/functionally/independently   []  Min assist  []  Min assist  []  Max assist  [x]  Non-functional ambulator         History/High risk of falls   []  Mod assist  [x]  Mod assist  []  Dependent  []  Unable to ambulate   []  Max  assist  []  Max assist  Transfer method:[x] 1 person [] 2 person [] sliding board [] squat pivot [x] stand pivot [] mechanical patient lift  [] other:   []  Unable  []  Unable    Fall History: # of falls in the past 6 months? 10 # of "near" falls in the past 6 months? multiple    CURRENT SEATING / MOBILITY:  Current Mobility Device: [] None [] Cane/Walker [x] Manual [] Dependent [] Dependent w/ Tilt rScooter  [] Power (type of control):   Manufacturer:  Model:  Serial #:   Size:  Color:  Age:   Purchased by whom:   Current condition of mobility base:    Current seating system:                                                                       Age of seating system:    Describe posture in present seating system:    Is the current mobility meeting medical necessity?:  [] Yes [x] No Describe: manual wheelchair is broken with tire missing, brakes not working, pt unable propel wheelchair for too long due to limited endurance COPD, fatigue                                     Ability to complete Mobility-Related Activities of Daily Living (MRADL's) with Current Mobility Device:    Move room to room  [] Independent  [] Min [x] Mod [] Max assist  [] Unable  Comments:   Meal prep  [] Independent  [] Min [] Mod [] Max assist  [x] Unable    Feeding  [x] Independent  [] Min [] Mod [] Max assist  [] Unable    Bathing  [] Independent  [] Min [] Mod [x] Max assist  [] Unable    Grooming  [] Independent  [x] Min [] Mod [] Max assist  [] Unable    UE dressing  [] Independent  [x] Min [] Mod [] Max assist  [] Unable    LE dressing  [] Independent   [] Min [] Mod [x] Max assist  [] Unable    Toileting  [] Independent  [] Min [x] Mod [] Max assist  [] Unable    Bowel Mgt: []  Continent [x]  Incontinent []  Accidents [x]  Diapers []  Colostomy []  Bowel Program:  Bladder Mgt: []  Continent []  Incontinent []  Accidents [x]  Diapers []  Urinal []  Intermittent Cath []  Indwelling Cath []  Supra-pubic Cath     Current Mobility Equipment Trialed/ Ruled Out:    Does not meet mobility needs due to:    Loraine Leriche all boxes that indicate inability to use the specific equipment listed     Meets needs for safe  independent functional  ambulation  / mobility    Risk of  Falling or History of Falls    Enviromental limitations      Cognition    Safety concerns with  physical ability    Decreased / limitations endurance  & strength     Decreased / limitations  motor skills  & coordination    Pain    Pace /  Speed    Cardiac and/or  respiratory condition    Contra - indicated by diagnosis   Cane/Crutches  []   [x]   []   []   []   [x]   [x]   []   [x]   [x]   [x]    Walker / Rollator  []  NA   []   [x]   []   []   []   [x]   [x]   []   [x]   [x]   [x]     Manual Wheelchair [id number]:  []  NA  []   [x]   []   []   [x]   [x]   [x]   []   [x]   [x]   [x]    Manual W/C (K0005) with power assist  []  NA  []   []   []   []   []   []   []   []   []   []   []    Scooter  []  NA  []   []   []   []   []   []   []   []   []   []   []    Power Wheelchair: standard joystick  []  NA  [x]   []   []   []   []   []   []   []   []   []   []    Power Wheelchair: alternative controls  []  NA  []   []   []   []   []   []   []    []   []   []   []    Summary:  The least costly alternative for independent functional mobility was found to be:    []  Crutch/Cane  []  Walker []  Manual w/c  []  Manual w/c with power assist   []  Scooter   [x]  Power w/c std joystick   []  Power w/c alternative control        []  Requires dependent care mobility device   Cabin crew for [employer] skills are adequate for safe mobility equipment operation  [x]   Yes []   No  Patient is willing and motivated to use recommended mobility equipment  [x]   Yes []   No       []  Patient is unable to safely operate mobility equipment independently and requires dependent care equipment Comments:           SENSATION and SKIN ISSUES:  Sensation []  Intact  [x]  Impaired []  Absent []  Hyposensate []  Hypersensate  []  Defensiveness  Location(s) of impairment:    Pressure Relief Method(s):  [x]  Lean side to side to offload (without risk of falling)  []   W/C push up (4+ times/hour for 15+ seconds) []  Stand up (without risk of falling)    []  Other: (Describe): Effective pressure relief method(s) above can be performed consistently throughout the day: [x] Yes  []  No If not, Why?:  Skin Integrity Risk:       []  Low risk           []  Moderate risk            [x]  High risk  If high risk, explain:   Skin Issues/Skin Integrity  Current skin Issues  []  Yes [x]  No []  Intact  []   Red area   []   Open area  []  Scar tissue  []  At  risk from prolonged sitting  Where: History of Skin Issues  []  Yes [x]  No Where : When: Stage: Hx of skin flap surgeries  []  Yes []  No Where:  When:  Pain: [x]  Yes []  No   Pain Location(s): bil shoulders, L residual leg with phantom pain Intensity scale: (0-10) : 8-10/10 How does pain interfere with mobility and/or MRADLs? - Pt unable to tolerate wearing prosthesis due to severe pain in residual leg, unable to prople wheelchair due to shoulder pain        MAT EVALUATION:  Neuro-Muscular Status: (Tone,  Reflexive, Responses, etc.)     [x]   Intact   []  Spasticity:  []  Hypotonicity  []  Fluctuating  []  Muscle Spasms  []  Poor Righting Reactions/Poor Equilibrium Reactions  []  Primal Reflex(s):    Comments:            COMMENTS:    POSTURE:     Comments:  Pelvis Anterior/Posterior:  [x]  Neutral   []  Posterior  []  Anterior  []  Fixed - No movement []  Tendency away from neutral []  Flexible []  Self-correction []  External correction Obliquity (viewed from front)  [x]  WFL []  R Obliquity []  L Obliquity  []  Fixed - No movement []  Tendency away from neutral []  Flexible []  Self-correction []  External correction Rotation  [x]  WFL []  R anterior []  L anterior  []  Fixed - No movement []  Tendency away from neutral []  Flexible []  Self-correction []  External correction Tonal Influence Pelvis:  [x]  Normal []  Flaccid []  Low tone []  Spasticity []  Dystonia []  Pelvis thrust []  Other:    Trunk Anterior/Posterior:  [x]  WFL []  Thoracic kyphosis []  Lumbar lordosis  []  Fixed - No movement []  Tendency away from neutral []  Flexible []  Self-correction []  External correction  [x]  WFL []  Convex to left  []  Convex to right []  S-curve   []  C-curve []  Multiple curves []  Tendency away from neutral []  Flexible []  Self-correction []  External correction Rotation of shoulders and upper trunk:  [x]  Neutral []  Left-anterior []  Right- anterior []  Fixed- no movement []  Tendency away from neutral []  Flexible []  Self correction []  External correction Tonal influence Trunk:  [x]  Normal []  Flaccid []  Low tone []  Spasticity []  Dystonia []  Other:   Head & Neck  [x]  Functional []  Flexed    []  Extended []  Rotated right  []  Rotated left []  Laterally flexed right []  Laterally flexed left []  Cervical hyperextension   [x]  Good head control []  Adequate head control []  Limited head control []  Absent head control Describe tone/movement of head and neck:      Lower  Extremity Measurements: LE ROM:  LE MMT:  MMT Right 04/05/2023 Left 04/05/2023  Hip flexion 5 5  Hip extension    Hip abduction    Hip adduction    Knee flexion 4 4  Knee extension 4 4  Ankle dorsiflexion    Ankle plantarflexion     (Blank rows = not tested)  Hip positions:  [x]  Neutral   []  Abducted   []  Adducted  []  Subluxed   []  Dislocated   []  Fixed   []  Tendency away from neutral [x]  Flexible [x]  Self-correction [x]  External correction   Hip Windswept:[x]  Neutral  []  Right    []  Left  []  Subluxed   []  Dislocated   []  Fixed   []  Tendency away from neutral [x]  Flexible [x]  Self-correction [x]  External correction  LE Tone: [x]  Normal []  Low tone []  Spasticity []  Flaccid []  Dystonia []  Rocks/Extends at  hip []  Thrust into knee extension []  Pushes legs downward into footrest  Foot positioning: ROM Concerns: Dorsiflexed: []  Right   []  Left Plantar flexed: []  Right    []  Left Inversion: []  Right    []  Left Eversion: []  Right    []  Left  LE Edema: [x]  1+ (Barely detectable impression when finger is pressed into skin) []  2+ (slight indentation. 15 seconds to rebound) []  3+ (deeper indentation. 30 seconds to rebound) []  4+ (>30 seconds to rebound)  UE Measurements:  UPPER EXTREMITY ROM: [facility]   UPPER EXTREMITY MMT:  MMT Right 04/05/2023 Left 04/05/2023  Shoulder flexion 5 5  Shoulder abduction 5 5  Shoulder adduction    Elbow flexion 5 5  Elbow extension 5 5  Wrist flexion    Wrist extension    Pinch strength    Grip strength    (Blank rows = not tested)  Shoulder Posture:  Right Tendency towards Left  [x]   Functional [x]    []   Elevation []    []   Depression []    []   Protraction []    []   Retraction []    []   Internal rotation []    []   External rotation []    []   Subluxed []     UE Tone: [x]  Normal []  Flaccid []  Low tone []  Spasticity  []  Dystonia []  Other:   UE Edema: [x]  1+ (Barely detectable impression when finger is pressed into  skin) []  2+ (slight indentation. 15 seconds to rebound) []  3+ (deeper indentation. 30 seconds to rebound) []  4+ (>30 seconds to rebound)  Wrist/Hand: Handedness: [x]  Right   []  Left   []  NA: Comments:  Right  Left  [x]   WNL [x]    []   Limitations []    []   Contractures []    []   Fisting []    []   Tremors []    []   Weak grasp []    []   Poor dexterity []    []   Hand movement non functional []    []   Paralysis []         MOBILITY BASE RECOMMENDATIONS and JUSTIFICATION:  MOBILITY BASE  JUSTIFICATION   Manufacturer:   [employer]ze Model:             [phone]                 Color:  Seat Width:  18" Seat Depth 18"   []  Manual mobility base (continue below)   []  Scooter/POV  [x]  Power mobility base   Number of hours per day spent in above selected mobility base: 10+ hours  Typical daily mobility base use Schedule:    [x]  is not a safe, functional ambulator  [x]  limitation prevents from completing a MRADL(s) within a reasonable time frame    [x]  limitation places at high risk of morbidity or mortality secondary to  the attempts to perform a    MRADL(s)  []  limitation prevents accomplishing a MRADL(s) entirely  [x]  provide independent mobility  [x]  equipment is a lifetime medical need  [x]  walker or cane inadequate  [x]  any type manual wheelchair      inadequate  []  scooter/POV inadequate      []  requires dependent mobility          MANUAL MOBILITY      []  Standard manual wheelchair  K0001      Arm:    []  both []  right  []  left      Foot:   []  both []  right   []  left  []   self-propels wheelchair  []  will use on regular basis  []  chair fits throughout home  []  willing and motivated to use  []  propels with assistance     []  dependent use   []  Standard hemi-manual wheelchair  K0002      Arm:    []  both []  right  []  left      Foot:   []  both []  right   []  left  []  lower seat height required to foot propel  []  short stature  []  self-propels wheelchair  []  will use on regular basis   []  chair fits throughout home  []  willing and motivated to use   []  propels with assistance  []  dependent use   []  Lightweight manual wheelchair  K0003      Arm:    []  both []  right  []  left      Foot:   []  both  []  right  []  left                   []  hemi height required  []  medical condition and weight of  wheelchair affect ability to self      propel standard manual wheelchair in the residence  []  can and does self-propel (marginal propulsion skills)  []  daily use _________hours  []  chair fits throughout home  []  willing and motivated to use  []  lower seat height required to foot propel  []  short stature   []  High strength lightweight manual  wheelchair (Breezy Ultra 4)  K0004     Arm:    []  both []  right  []  left     Foot:   []  both []  right   []  left                                                                  []  hemi height required []  medical condition and weight of wheelchair affect ability to self propel while engaging in frequent MRADL(s) that cannot be performed in a standard or lightweight manual wheelchair  []  daily use _________hours  []  chair fits throughout home  []  willing and motivated to use  []  prevent repetitive use injuries   []  lower seat height required to foot propel  []  short stature    []  Ultra-lightweight manual wheelchair  K0005     Arm:    []  both []  right  []  left     Foot:   []  both []  right  []  left       []  hemi height required  []  heavy duty    Front seat to floor _____ inches      Rear seat to floor _____ inches      Back height _____ inches     Back angle ______ degrees      Front angle _____ degrees  []   full-time manual wheelchair user  []  Requires individualized fitting and optimal adjustments for multiple features that include adjustable axle configuration, fully adjustable center of gravity, wheel camber, seat and back angle, angle of seat slope, which cannot be accommodated by a K0001 through K0004 manual wheelchair  []   prevent repetitive use injuries  []  daily use_________hours   []  user has high activity patterns that frequently require  them  to go out into the community for the purpose of independently accomplishing high level MRADL activities. Examples of these might include a combination of; shopping, work, school, Photographer, childcare, independently loading and unloading from a vehicle etc.  []  lower seat height required to foot propel  []  short stature  []  heavy duty -  weight over 250lbs   []  Current chair is a K0005   manufacture:___________________  model:_________________  serial#____________________  age:_________    []  First time [id number] user (complete trial)  K0004 time and # of strokes to propel 30 feet: ________seconds _________strokes  B1478 time and # of strokes to propel 30 feet: ________seconds _________strokes  What was the result of the trial between the K0004 and K0005 manual wheelchair? ___    What features of the K0005 w/c are needed as compared to the K0004 base? Why?___    []  adjustable seat and back angle changes the angle of seat slope of the frame to attain a gravity assisted position for efficient propulsion and proper weight distribution along the frame     []  the front of the wheelchair will be configured higher than the back of the chair to allow gravity to assist the user with postural stability  []  the center of the wheel will be positioned for stability, safety and efficient propulsion  []  adjustable axle allows for vertical, horizontal, camber and overall width changes  throughout the wheels for adjustment of the client's exact needs and abilities.   []  adjustable axle increases the stability and function of the chair allowing for adjustment of the center of gravity.   []  accommodates the client's anatomical position in the chair maximizing independence in mobility and maneuverability in all environments.   []  create a minimal fixed tilt-in space to assist in  positioning.   []  Describe users full-time manual wheelchair activity patterns:___    []  Power assist Comments:  []  prevent repetitive use injuries  []  repetitive strain injury present in    shoulder girdle    []  shoulder pain is (> or =) to 7/10     during manual propulsion       Current Pain _____/10  []  requires conservation of energy to participate in MRADL(s) runable to propel up ramps or curbs using manual wheelchair  []  been K0005 user greater than one year  []  user unwilling to use power      wheelchair (reason): []  less expensive option to power   wheelchair   []  rim activated power assist -      decreased strength   []  Heavy duty manual wheelchair       K0006     Arm:    []  both []  right  []  left     Foot:   []  both []  right  []  left     []  hemi height required    []  Dependent base  []  user exceeds 250lbs  []  non-functional ambulator    []  extreme spasticity  []  over active movement   []  broken frame/hx of repeated     repairs  []  able to self-propel in residence       []  lower seat to floor height required  []  unable to self-propel in residence   []  Extra heavy duty manual wheelchair  [id number]     Arm:    []  both []  right  []  left     Foot:   []  both []  right  []  left     []  hemi height required  []   Dependent base  []  user exceeds 300lbs  []  non-functional ambulator    []  able to self-propel in residence   []  lower seat to floor height required  []  unable to self-propel in residence     []  Manual wheelchair with tilt [phone]      (Manual "Tilt-n-Space")  []  patient is dependent for transfers  []  patient requires frequent       positioning for pressure relief   []  patient requires frequent      positioning for poor/absent trunk control        []  Stroller Base  []  infant/child   []  unable to propel manual      wheelchair  []  allows for growth  []  non-functional ambulator  []  non-functional UE  []  independent mobility is not a goal at this time    MANUAL FRAME  OPTIONS      Push handles  []  extended   []  angle adjustable   []  standard  []  caregiver access  []  caregiver assist    []  allows "hooking" to enable      increased ability to perform ADLs or maintain balance   []  Angle Adjustable Back  []  postural control  []  control of tone/spasticity  []  accommodation of range of motion  []  UE functional control  []  accommodation for seating system    Rear wheel placement  []  std/fixed  [] fully adjustableramputee   []  camber ________degree  []  removable rear wheel  []  non-removable rear wheel  Wheel size _______  Wheel style_______________________  []  improved UE access to wheels  []  increase propulsion ability  []  improved stability  []  changing angle in space for      improvement of postural stability  []  remove for transport    []  allow for seating system to fit on  base  []  amputee placement  []  1-arm drive access   r R  r L  []  enable propulsion of manual       wheelchair with one arm    []  amputee placement   Wheel rims/ Hand rims  []  Standard    []  Specialized-____ []  provide ability to propel manual   []  increase self-propulsion with hand wheelchair weakness/decreased grasp     []  Spoke protector/guard   []  prevent hands from getting caught in spokes   Tires:  []  pneumatic  []  flat free inserts  []  solid  Style:  []  decrease roll resistance              []  prevent frequent flats  []  increase shock absorbency  []  decrease maintenance   []  decrease pain from road shock    []  decrease spasms from road shock    Wheel Locks:    []  push []  pull []  scissor  []  lock wheels for transfers  []  lock wheels from rolling   Brake/wheel lock extension:  []  R  []  L  []  allow user to operate wheel locks due to decreased reach or strength   Caster housing:  Caster size:                      Style:                                          []  suspension fork  []  maneuverability   []  stability of wheelchair   []  durability  []   maintenance   []  angle adjustment for posture  []  allow for feet to come under        wheelchair base  []  allows change in seat to floor  height   []  increase shock absorbency  []  decrease pain from road shock  []  decrease spasms from road    shock   []  Side guards  []  prevent clothing getting caught in wheel or becoming soiled   [] provide hip and pelvic stability  []  eliminates contact between body and wheels  []  limit hand contact with wheels   []  Anti-tippers      []  prevent wheelchair from tipping    backward  []  assist caregiver with curbs     POWER MOBILITY      []  Scooter/POV    []  can safely operate   []  can safely transfer   []  has adequate trunk stability   []  cannot functionally propel  manual wheelchair    [x]  Power mobility base    []  non-ambulatory   [x]  cannot functionally propel manual wheelchair   [x]  cannot functionally and safely      operate scooter/POV  [x]  can safely operate power       wheelchair  [x]  home is accessible  [x]  willing to use power wheelchair     Tilt  []  Powered tilt on powered chair  []  Powered tilt on manual chair  []  Manual tilt on manual chair Comments:  []  change position for pressure      []  elief/cannot weight shift   []  change position against      gravitational force on head and      shoulders   []  decrease pain  []  blood pressure management   []  control autonomic dysreflexia  []  decrease respiratory distress  []  management of spasticity  []  management of low tone  []  facilitate postural control   []  rest periods   []  control edema  []  increase sitting tolerance   []  aid with transfers     Recline   []  Power recline on power chair  []  Manual recline on manual chair  Comments:    []  intermittent catheterization  []  manage spasticity  []  accommodate femur to back angle  []  change position for pressure relief/cannot weight shift rhigh risk of pressure sore development  []  tilt alone does not accomplish     effective pressure relief,  maximum pressure relief achieved at -      _______ degrees tilt   _______ degrees recline   []  difficult to transfer to and from bed []  rest periods and sleeping in chair  []  repositioning for transfers  []  bring to full recline for ADL care  []  clothing/diaper changes in chair  []  gravity PEG tube feeding  []  head positioning  []  decrease pain  []  blood pressure management   []  control autonomic dysreflexia  []  decrease respiratory distress  []  user on ventilator     Elevator on mobility base  []  Power wheelchair  []  Scooter  []  increase Indep in transfers   []  increase Indep in ADLs    []  bathroom function and safety  []  kitchen/cooking function and safety  []  shopping  []  raise height for communication at standing level  []  raise height for eye contact which reduces cervical neck strain and pain  []  drive at raised height for safety and navigating crowds  []  Other:   []  Vertical position system  (anterior tilt)     (Drive locks-out)    []   Stand       (Drive enabled)  []  independent weight bearing  []  decrease joint contractures  []  decrease/manage spasticity  []  decrease/manage spasms  []  pressure distribution away from   scapula, sacrum, coccyx, and ischial tuberosity  []  increase digestion and elimination   []  access to counters and cabinets  []  increase reach  []  increase interaction with others at eye level, reduces neck strain  []  increase performance of       MRADL(s)      Power elevating legrest    []  Center mount (Single) 85-170 degrees       []  Standard (Pair) 100-170 degrees  []  position legs at 90 degrees, not available with std power ELR  []  center mount tucks into chair to decrease turning radius in home, not available with std power ELR  []  provide change in position for LE  []  elevate legs during recline    []  maintain placement of feet on      footplate  []  decrease edema  []  improve circulation  []  actuator needed to elevate legrest  []   actuator needed to articulate legrest preventing knees from flexing  []  Increase ground clearance over      curbs  []   STD (pair) independently                     elevate legrest   POWER WHEELCHAIR CONTROLS      Controls/input device  []  Expandable  [x]  Non-expandable  [x]  Proportional  [x]  Right Hand []  Left Hand  []  Non-proportional/switches/head-array  []  Electrical/proximity         []   Mechanical      Manufacturer:___________________   Type:________________________ [x]  provides access for controlling wheelchair  []  programming for accurate control  []  progressive disease/changing condition  []  required for alternative drive      controls       []  lacks motor control to operate  proportional drive control  []  unable to understand proportional controls  []  limited movement/strength  []  extraneous movement / tremors / ataxic / spastic       []  Upgraded electronics controller/harness    []  Single power (tilt or recline)   []  Expandable    []  Non-expandable plus   []  Multi-power (tilt, recline, power legrest, power seat lift, vertical positioning system, stand)  []  allows input device to communicate with drive motors  []  harness provides necessary connections between the controller, input device, and seat functions     []  needed in order to operate power seat functions through joystick/ input device  []  required for alternative drive controls     []  Enhanced display  []  required to connect all alternative drive controls   []  required for upgraded joystick      (lite-throw, heavy duty, micro)  []  Allows user to see in which mode and drive the wheelchair is set; necessary for alternate controls       []  Upgraded tracking electronics  []  correct tracking when on uneven surfaces makes switch driving more efficient and less fatiguing  []  increase safety when driving  []  increase ability to traverse thresholds    []  Safety / reset / mode switches     Type:    []  Used to change  modes and stop the wheelchair when driving     [x]  Mount for joystick / input device/switches  [x]  swing away for access or transfers   [x]  attaches joystick / input device / switches  to wheelchair   [x]  provides for consistent access  [x]  midline for optimal placement    []  Attendant controlled joystick plus     mount  []  safety  []  long distance driving  []  operation of seat functions  []  compliance with transportation regulations    [x]  Battery U1 x 2 [x]  required to power (power assist / scooter/ power wc / other):   []  Power inverter (24V to 12V)  []  required for ventilator / respiratory equipment / other:     CHAIR OPTIONS MANUAL & POWER      Armrests   [x]  adjustable height []  removable  []  swing away []  fixed  [x]  flip back  []  reclining  [x]  full length pads []  desk []  tube arms []  gel pads  [x]  provide support with elbow at 90    [x]  remove/flip back/swing away for  transfers  [x]  provide support and positioning of upper body    [x]  allow to come closer to table top  []  remove for access to tables  []  provide support for w/c tray  []  change of height/angles for variable activities   []  Elbow support / Elbow stop  []  keep elbow positioned on arm pad  []  keep arms from falling off arm pad  during tilt and/or recline   Upper Extremity Support  []  Arm trough  []   R  []   L  Style:  []  swivel mount []  fixed mount   []  posterior hand support  []   tray  []  full tray  []  joystick cut out  []   R  []   L  Style:  []  decrease gravitational pull on      shoulders  []  provide support to increase UE  function  []  provide hand support in natural    position  []  position flaccid UE  []  decrease subluxation    []  decrease edema       []  manage spasticity   []  provide midline positioning  []  provide work surface  []  placement for AAC/ Computer/ EADL       Hangers/ Legrests   []  ______ degree  []  Elevating []  articulating  []  swing away []  fixed []  lift off  []  heavy duty  []   adjustable knee angle  []  adjustable calf panel   []  longer extension tube              []  provide LE support  []  maintain placement of feet on      footplate   []  accommodate lower leg length  []  accommodate to hamstring       tightness  []  enable transfers  []  provide change in position for LE's  []  elevate legs during recline    []  decrease edema  []  durability      Foot support   [x]  footplate []  R []  L [x]  flip up           []  Depth adjustable   []  angle adjustable  []  foot board/one piece    [x]  provide foot support  [x]  accommodate to ankle ROM  [x]  allow foot to go under wheelchair base  [x]  enable transfers     []  Shoe holders  []  position foot    []  decrease / manage spasticity  []  control position of LE  []  stability    []  safety     []  Ankle strap/heel      loops  []  support foot on foot support  []  decrease  extraneous movement  []  provide input to heel   []  protect foot     [x]  Amputee adapter []  R  [x]  L     Style:                  Size:  [x]  Provide support for stump/residual extremity    []  Transportation tie-down  []  to provide crash tested tie-down brackets    []  Crutch/cane holder    [x]  O2 holder    []  IV hanger   []  Ventilator tray/mount    [x]  stabilize accessory on wheelchair       Component  Justification     []  Seat cushion      []  accommodate impaired sensation  []  decubitus ulcers present or history  []  unable to shift weight  []  increase pressure distribution  []  prevent pelvic extension  []  custom required "off-the-shelf"    seat cushion will not accommodate deformity  []  stabilize/promote pelvis alignment  []  stabilize/promote femur alignment  []  accommodate obliquity  []  accommodate multiple deformity  []  incontinent/accidents  []  low maintenance     []  seat mounts                 []  fixed []  removable  []  attach seat platform/cushion to wheelchair frame    []  Seat wedge    []  provide increased aggressiveness of seat shape to  decrease sliding  down in the seat  []  accommodate ROM        []  Cover replacement   []  protect back or seat cushion  []  incontinent/accidents    []  Solid seat / insert    []  support cushion to prevent      hammocking  []  allows attachment of cushion to mobility base    []  Lateral pelvic/thigh/hip     support (Guides)     []  decrease abduction  []  accommodate pelvis  []  position upper legs  []  accommodate spasticity  []  removable for transfers     []  Lateral pelvic/thigh      supports mounts  []  fixed   []  swing-away   []  removable  []  mounts lateral pelvic/thigh supports     []  mounts lateral pelvic/thigh supports swing-away or removable for transfers    []  Medial thigh support (Pommel)  [] decrease adduction  [] accommodate ROM  []  remove for transfers   []  alignment      []  Medial thigh   []  fixed      support mounts      []  swing-away   []  removable  []  mounts medial thigh supports   []  Mounts medial supports swing- away or removable for transfers       Component  Justification   []  Back       []  provide posterior trunk support []  facilitate tone  []  provide lumbar/sacral support []  accommodate deformity  []  support trunk in midline   []  custom required "off-the-shelf" back support will not accommodate deformity   []  provide lateral trunk support []  accommodate or decrease tone            []  Back mounts  []  fixed  []  removable  []  attach back rest/cushion to wheelchair frame   []  Lateral trunk      supports  []  R []  L  []  decrease lateral trunk leaning  []  accommodate asymmetry    []  contour for increased contact  []  safety    []  control of tone    []  Lateral trunk  supports mounts  []  fixed  []  swing-away   []  removable  []  mounts lateral trunk supports     []  Mounts lateral trunk supports swing-away or removable for transfers   []  Anterior chest      strap, vest     []  decrease forward movement of shoulder  []  decrease forward movement of trunk  []   safety/stability  []  added abdominal support  []  trunk alignment  []  assistance with shoulder control   []  decrease shoulder elevation    [x]  Headrest      [x]  provide posterior head support  []  provide posterior neck support  []  provide lateral head support  []  provide anterior head support  []  support during tilt and recline  []  improve feeding     []  improve respiration  []  placement of switches  []  safety    []  accommodate ROM   []  accommodate tone  []  improve visual orientation   []  Headrest           []  fixed []  removable []  flip down      Mounting hardware   []  swing-away laterals/switches  []  mount headrest   []  mounts headrest flip down or  removable for transfers  []  mount headrest swing-away laterals   []  mount switches     []  Neck Support    []  decrease neck rotation  []  decrease forward neck flexion   Pelvic Positioner    [x]  std hip belt          []  padded hip belt  []  dual pull hip belt  []  four point hip belt  []  stabilize tone  [x]  decrease falling out of chair  []  prevent excessive extension  []  special pull angle to control      rotation  []  pad for protection over boney   prominence  [x]  promote comfort    []  Essential needs        bag/pouch   []  medicines []  special food rorthotics []  clothing changes  []  diapers  []  catheter/hygiene []  ostomy supplies   The above equipment has a life- long use expectancy.  Growth and changes in medical and/or functional conditions would be the exceptions.   SUMMARY:  Why mobility device was selected; include why a lower level device is not appropriate: Current manual wheelchair is broken with missing tire on left wheel, brakes do not work, no seat cushion in the chair. Pt is at very high risk for fall with multiple hx of falls in last 6 months. Patient is not a safe functional Ambulator without risk for fall. Due to CHF and COPD and bil shoulder pain, he is not independently able to propel wheelchair for prolonged periods  of time.   ASSESSMENT:  CLINICAL IMPRESSION: Patient is a 72 y.o. male who was seen today for physical therapy evaluation and treatment for mobility assessment. Pt has history of CHF, COPD, DM and L BKA. Patient is currently unable to wear his prosthetic leg due to increased pain in residual leg and difficulty with don/doff independently. Patient's overall endurance is limited due to CHF and COPD. Patient is also dependant on O2 due to COPD. Patient will benefit from Group 2 power wheelchair to improve functional mobility within his home and reduce the risk of fall. .    OBJECTIVE IMPAIRMENTS Abnormal gait, decreased activity tolerance, decreased balance, decreased endurance, decreased mobility, difficulty walking, decreased strength, decreased safety awareness, impaired perceived functional ability, impaired flexibility, impaired UE functional use, postural dysfunction,  prosthetic dependency , and pain.   ACTIVITY LIMITATIONS carrying, lifting, bending, sitting, standing, squatting, stairs, transfers, bed mobility, bathing, toileting, and dressing  PARTICIPATION LIMITATIONS: meal prep, cleaning, laundry, personal finances, shopping, and community activity  PERSONAL FACTORS Age, Time since onset of injury/illness/exacerbation, and 3+ comorbidities: CHF, COPD, DM, BKA  are also affecting patient's functional outcome.   REHAB POTENTIAL: Good  CLINICAL DECISION MAKING: Stable/uncomplicated  EVALUATION COMPLEXITY: Low                                   GOALS: One time visit. No goals established.    PLAN: PT FREQUENCY: one time visit    Ileana Ladd, PT 04/05/2023, 1:49 PM    I concur with the above findings and recommendations of the therapist:  Physician name printed:         Physician's signature:      Date:

## 2023-04-12 ENCOUNTER — Telehealth: Payer: Self-pay

## 2023-04-12 NOTE — Progress Notes (Signed)
  Care Coordination Note  04/12/2023 Name: Danny Silerio Everett Sr. MRN: [id number] DOB: 19-Jun-1951  Danny Friends Chawla Sr. is a 72 y.o. year old male who is a primary care patient of [name], MD and is actively engaged with the care management team. I reached out to [employer] Sr[employer] by phone today to assist with re-scheduling a follow up visit with the RN Case Manager  Follow up plan: Telephone appointment with care management team member scheduled for: 04/24/2023  [name], [facility] Care Coordination Care Guide Direct Dial: [phone]

## 2023-04-12 NOTE — Telephone Encounter (Signed)
Fyi,  Received paperwork request chart notes for pt from Julianne Rice at [facility]. Chart notes faxed over to [phone] with the attention to [facility].

## 2023-04-19 ENCOUNTER — Encounter (INDEPENDENT_AMBULATORY_CARE_PROVIDER_SITE_OTHER): Payer: Self-pay | Admitting: Vascular Surgery

## 2023-04-19 ENCOUNTER — Ambulatory Visit (INDEPENDENT_AMBULATORY_CARE_PROVIDER_SITE_OTHER): Payer: 59 | Admitting: Vascular Surgery

## 2023-04-19 VITALS — BP 100/64 | HR 72 | Resp 18 | Ht 72.0 in | Wt 235.0 lb

## 2023-04-19 DIAGNOSIS — M7989 Other specified soft tissue disorders: Secondary | ICD-10-CM | POA: Insufficient documentation

## 2023-04-19 DIAGNOSIS — N183 Chronic kidney disease, stage 3 unspecified: Secondary | ICD-10-CM | POA: Diagnosis not present

## 2023-04-19 DIAGNOSIS — E1169 Type 2 diabetes mellitus with other specified complication: Secondary | ICD-10-CM

## 2023-04-19 DIAGNOSIS — I5022 Chronic systolic (congestive) heart failure: Secondary | ICD-10-CM | POA: Diagnosis not present

## 2023-04-19 DIAGNOSIS — I70213 Atherosclerosis of native arteries of extremities with intermittent claudication, bilateral legs: Secondary | ICD-10-CM

## 2023-04-19 DIAGNOSIS — E785 Hyperlipidemia, unspecified: Secondary | ICD-10-CM | POA: Diagnosis not present

## 2023-04-19 NOTE — Progress Notes (Signed)
Patient ID: Danny Scrape Sr., male   DOB: 19-Jun-1951, 72 y.o.   MRN: [id number]  Chief Complaint  Patient presents with   Follow-up    consult. Recurrent R leg edema, history of vascular stenting. referred by [facility]    HPI Danny Friends Charbonneau Sr. is a 72 y.o. male.  I am asked to see the patient by Dr. Fanny Bien for evaluation of right lower extremity swelling in a patient with significant vascular history who we have not seen in over a year at this point.  He underwent extensive right lower extremity revascularization about a year and a half ago.  He has already lost the left leg from vascular disease about 2-1/2 years ago.  He had limb threatening ischemia but at this point on the right side, his biggest complaint is of pain.  He is not having a lot of pain.  No ulceration.  No evidence of infection.  He uses a wheelchair and is not really walking much at this point.  He had a negative DVT study in the emergency room..     Past Medical History:  Diagnosis Date   CAD (coronary artery disease)    a. 01/2010 PCI of LAD; b. 09/2014 PCI/DES of mLAD due to ISR. D1 80, D2 80(jailed), LCX 21m; c. 07/2016 NSTEMI/Cath: LM nl, LAD 57m ISR, 40d, D1 90ost, 80p, D2 90ost, RI min irregs, LCX min irregs, OM1 90 small, OM2/3 min irregs, RCA min irregs, RPLB1 90, EF 25-35%.   Chronic combined systolic and diastolic CHF (congestive heart failure) (HCC)    a. 07/2016 Echo: EF 30%, severe septal/anterior HK, Gr1 DD.   CKD (chronic kidney disease), stage III (HCC)    COPD (chronic obstructive pulmonary disease) (HCC)    DM2 (diabetes mellitus, type 2) (HCC)    Erectile dysfunction    HLD (hyperlipidemia)    HTN (hypertension)    Ischemic cardiomyopathy    a. 07/2016 Echo: EF 30% w/ sev septal/ant HK. Gr1 DD.    Past Surgical History:  Procedure Laterality Date   AMPUTATION Left 11/10/2020   Procedure: AMPUTATION BELOW KNEE;  Surgeon: Annice Needy, MD;  Location: [facility] OR[facility];  Service: General;  Laterality: Left;    BELOW KNEE LEG AMPUTATION     CAD: stent to the LAD     CARDIAC CATHETERIZATION  10/01/2014   CARDIAC CATHETERIZATION N/A 07/23/2016   Procedure: Left Heart Cath and Coronary Angiography;  Surgeon: Iran Ouch, MD;  Location: [facility];  Service: Cardiovascular;  Laterality: N/A;   CORONARY ANGIOPLASTY WITH STENT PLACEMENT  10/01/2014   LOWER EXTREMITY ANGIOGRAPHY Left 10/31/2020   Procedure: LOWER EXTREMITY ANGIOGRAPHY;  Surgeon: Annice Needy, MD;  Location: [facility];  Service: Cardiovascular;  Laterality: Left;   LOWER EXTREMITY ANGIOGRAPHY Right 11/20/2021   Procedure: Lower Extremity Angiography;  Surgeon: Annice Needy, MD;  Location: [facility];  Service: Cardiovascular;  Laterality: Right;     Family History  Problem Relation Age of Onset   Heart attack Father        complications   Cancer Brother   No bleeding or clotting disorders   Social History   Tobacco Use   Smoking status: Former    Current packs/day: 0.00    Types: Cigarettes    Start date: 09/07/2009    Quit date: 09/07/2009    Years since quitting: 13.6   Smokeless tobacco: Never   Tobacco comments:    quit  june 2011  Vaping Use   Vaping status: Never Used  Substance Use Topics   Alcohol use: Not Currently    Alcohol/week: 7.0 standard drinks of alcohol    Types: 6 Cans of beer, 1 Standard drinks or equivalent per week    Comment: last week 07/26/2022   Drug use: Never     Allergies  Allergen Reactions   Metformin And Related Diarrhea    If he takes it twice a day it causes diarhea, so he reduced to once daily    Current Outpatient Medications  Medication Sig Dispense Refill   albuterol (VENTOLIN HFA) 108 (90 Base) MCG/ACT inhaler Inhale 2 puffs into the lungs every 6 (six) hours as needed for wheezing or shortness of breath. 8 g 2   Albuterol Sulfate (PROAIR RESPICLICK) 108 (90 Base) MCG/ACT AEPB Inhale 1 puff into the lungs every 6 (six) hours as needed. 1 each 2    amLODipine (NORVASC) 10 MG tablet Take 1 tablet (10 mg total) by mouth daily. 90 tablet 1   aspirin EC 81 MG tablet Take 1 tablet (81 mg total) by mouth daily. 90 tablet 3   atorvastatin (LIPITOR) 80 MG tablet Take 1 tablet (80 mg total) by mouth daily. 90 tablet 1   carvedilol (COREG) 25 MG tablet Take 1 tablet (25 mg total) by mouth 2 (two) times daily with a meal. 180 tablet 1   clopidogrel (PLAVIX) 75 MG tablet TAKE 1 TABLET BY MOUTH ONCE DAILY 30 tablet 0   Continuous Blood Gluc Receiver (FREESTYLE LIBRE 2 READER) DEVI 1 Device by Does not apply route 4 (four) times daily. 1 each 0   Continuous Glucose Sensor (FREESTYLE LIBRE 2 SENSOR) MISC Check blood sugars 6 each 3   Dulaglutide (TRULICITY) 4.5 MG/0.5ML SOPN Inject 4.5 mg into the skin once a week. 6 mL 1   empagliflozin (JARDIANCE) 25 MG TABS tablet Take 1 tablet (25 mg total) by mouth daily. 90 tablet 1   escitalopram (LEXAPRO) 10 MG tablet Take 1 tablet (10 mg total) by mouth daily. 90 tablet 1   MOUNJARO 5 MG/0.5ML Pen Inject 5 mg into the skin once a week.     nitroGLYCERIN (NITROSTAT) 0.4 MG SL tablet Place 0.4 mg under the tongue every 5 (five) minutes as needed for chest pain.     spironolactone (ALDACTONE) 25 MG tablet TAKE 1 TABLET (25 MG TOTAL) BY MOUTH DAILY. 90 tablet 1   umeclidinium-vilanterol (ANORO ELLIPTA) 62.5-25 MCG/ACT AEPB Inhale 1 puff into the lungs daily. 3 each 11   acetaminophen (TYLENOL) 325 MG tablet Take 2 tablets (650 mg total) by mouth every 4 (four) hours as needed for mild pain (or temp > 37.5 C (99.5 F)). (Patient not taking: Reported on 03/25/2023)     albuterol (PROVENTIL) (2.5 MG/3ML) 0.083% nebulizer solution Take 3 mLs (2.5 mg total) by nebulization every 6 (six) hours as needed for wheezing or shortness of breath. (Patient not taking: Reported on 02/25/2023) 150 mL 1   furosemide (LASIX) 20 MG tablet Take 1 tablet (20 mg total) by mouth daily for 7 days. (Patient not taking: Reported on 03/25/2023) 7  tablet 0   tamsulosin (FLOMAX) 0.4 MG CAPS capsule Take 1 capsule (0.4 mg total) by mouth daily. (Patient not taking: Reported on 03/25/2023) 90 capsule 1   No current facility-administered medications for this visit.      REVIEW OF SYSTEMS (Negative unless checked)  Constitutional: [] Weight loss  [] Fever  [] Chills Cardiac: [] Chest pain   []   Chest pressure   [] Palpitations   [] Shortness of breath when laying flat   [] Shortness of breath at rest   [] Shortness of breath with exertion. Vascular:  [] Pain in legs with walking   [] Pain in legs at rest   [] Pain in legs when laying flat   [] Claudication   [] Pain in feet when walking  [] Pain in feet at rest  [] Pain in feet when laying flat   [] History of DVT   [] Phlebitis   [x] Swelling in legs   [] Varicose veins   [] Non-healing ulcers Pulmonary:   [] Uses home oxygen   [] Productive cough   [] Hemoptysis   [] Wheeze  [] COPD   [] Asthma Neurologic:  [] Dizziness  [] Blackouts   [] Seizures   [] History of stroke   [] History of TIA  [] Aphasia   [] Temporary blindness   [] Dysphagia   [] Weakness or numbness in arms   [] Weakness or numbness in legs Musculoskeletal:  [x] Arthritis   [] Joint swelling   [x] Joint pain   [] Low back pain Hematologic:  [] Easy bruising  [] Easy bleeding   [] Hypercoagulable state   [] Anemic  [] Hepatitis Gastrointestinal:  [] Blood in stool   [] Vomiting blood  [] Gastroesophageal reflux/heartburn   [] Abdominal pain Genitourinary:  [] Chronic kidney disease   [] Difficult urination  [] Frequent urination  [] Burning with urination   [] Hematuria Skin:  [] Rashes   [] Ulcers   [] Wounds Psychological:  [] History of anxiety   []  History of major depression.    Physical Exam BP 100/64 (BP Location: Right Arm)   Pulse 72   Resp 18   Ht 6' (1.829 m)   Wt 235 lb (106.6 kg)   BMI 31.87 kg/m  Gen:  WD/WN, NAD Head: [place]/AT, No temporalis wasting. Ear/Nose/Throat: Hearing grossly intact, nares w/o erythema or drainage, oropharynx w/o Erythema/Exudate Eyes:  Conjunctiva clear, sclera non-icteric  Neck: trachea midline.  No JVD.  Pulmonary:  Good air movement, respirations not labored, no use of accessory muscles  Cardiac: irregular Vascular:  Vessel Right Left  Radial Palpable Palpable                          DP 1+ NP  PT NP NP   Gastrointestinal:. No masses, surgical incisions, or scars. Musculoskeletal: M/S 5/5 throughout.  Extremities without ischemic changes.  No deformity or atrophy.  Left leg amputation well-healed.  1-2+ right lower extremity edema with moderate stasis dermatitis changes present. Neurologic: Sensation grossly intact in extremities.  Symmetrical.  Speech is fluent. Motor exam as listed above. Psychiatric: Judgment intact, Mood & affect appropriate for pt's clinical situation. Dermatologic: No rashes or ulcers noted.  No cellulitis or open wounds.    Radiology No results found.  Labs Recent Results (from the past 2160 hour(s))  CBC with Differential     Status: None   Collection Time: 02/16/23 11:33 AM  Result Value Ref Range   WBC 8.0 4.0 - 10.5 K/uL   RBC 5.20 4.22 - 5.81 MIL/uL   Hemoglobin 16.1 13.0 - 17.0 g/dL   HCT 78.2 95.6 - 21.3 %   MCV 96.3 80.0 - 100.0 fL   MCH 31.0 26.0 - 34.0 pg   MCHC 32.1 30.0 - 36.0 g/dL   RDW 08.6 57.8 - 46.9 %   Platelets 236 150 - 400 K/uL   nRBC 0.0 0.0 - 0.2 %   Neutrophils Relative % 54 %   Neutro Abs 4.4 1.7 - 7.7 K/uL   Lymphocytes Relative 28 %   Lymphs Abs 2.3 0.7 -  4.0 K/uL   Monocytes Relative 10 %   Monocytes Absolute 0.8 0.1 - 1.0 K/uL   Eosinophils Relative 6 %   Eosinophils Absolute 0.5 0.0 - 0.5 K/uL   Basophils Relative 1 %   Basophils Absolute 0.1 0.0 - 0.1 K/uL   Immature Granulocytes 1 %   Abs Immature Granulocytes 0.04 0.00 - 0.07 K/uL    Comment: Performed at [facility], [address], [place], [place] [address]  Basic metabolic panel     Status: Abnormal   Collection Time: 02/16/23 11:33 AM  Result Value Ref Range    Sodium 137 135 - 145 mmol/L   Potassium 3.5 3.5 - 5.1 mmol/L   Chloride 103 98 - 111 mmol/L   CO2 27 22 - 32 mmol/L   Glucose, Bld 280 (H) 70 - 99 mg/dL    Comment: Glucose reference range applies only to samples taken after fasting for at least 8 hours.   BUN 26 (H) 8 - 23 mg/dL   Creatinine, Ser 6.04 0.61 - 1.24 mg/dL   Calcium 9.0 8.9 - 54.0 mg/dL   GFR, Estimated >98 >11 mL/min    Comment: (NOTE) Calculated using the CKD-EPI Creatinine Equation (2021)    Anion gap 7 5 - 15    Comment: Performed at [facility], [address] [place], [place] [address]  Brain natriuretic peptide     Status: None   Collection Time: 02/18/23  8:45 PM  Result Value Ref Range   B Natriuretic Peptide 63.6 0.0 - 100.0 pg/mL    Comment: Performed at [facility], [address] [place], [place] [address]  Basic metabolic panel     Status: Abnormal   Collection Time: 02/18/23  8:57 PM  Result Value Ref Range   Sodium 137 135 - 145 mmol/L   Potassium 3.6 3.5 - 5.1 mmol/L   Chloride 105 98 - 111 mmol/L   CO2 23 22 - 32 mmol/L   Glucose, Bld 381 (H) 70 - 99 mg/dL    Comment: Glucose reference range applies only to samples taken after fasting for at least 8 hours.   BUN 32 (H) 8 - 23 mg/dL   Creatinine, Ser 1.30 0.61 - 1.24 mg/dL   Calcium 8.5 (L) 8.9 - 10.3 mg/dL   GFR, Estimated >86 >57 mL/min    Comment: (NOTE) Calculated using the CKD-EPI Creatinine Equation (2021)    Anion gap 9 5 - 15    Comment: Performed at [facility], [address] [place], [place] [address]  CBC     Status: None   Collection Time: 02/18/23  8:57 PM  Result Value Ref Range   WBC 7.5 4.0 - 10.5 K/uL   RBC 4.73 4.22 - 5.81 MIL/uL   Hemoglobin 14.9 13.0 - 17.0 g/dL   HCT 29.5 28.4 - 13.2 %   MCV 97.7 80.0 - 100.0 fL   MCH 31.5 26.0 - 34.0 pg   MCHC 32.3 30.0 - 36.0 g/dL   RDW 44.0 10.2 - 72.5 %   Platelets 217 150 - 400 K/uL   nRBC 0.0 0.0 - 0.2 %    Comment: Performed at [facility], [address] [place], [place] [address]  Urinalysis, Routine w reflex microscopic -Urine, Clean Catch     Status: Abnormal   Collection Time: 02/18/23  8:59 PM  Result Value Ref Range   Color, Urine STRAW (A) YELLOW   APPearance CLEAR (A) CLEAR   Specific Gravity,  Urine 1.021 1.005 - 1.030   pH 5.0 5.0 - 8.0   Glucose, UA >=500 (A) NEGATIVE mg/dL   Hgb urine dipstick SMALL (A) NEGATIVE   Bilirubin Urine NEGATIVE NEGATIVE   Ketones, ur NEGATIVE NEGATIVE mg/dL   Protein, ur NEGATIVE NEGATIVE mg/dL   Nitrite NEGATIVE NEGATIVE   Leukocytes,Ua NEGATIVE NEGATIVE   RBC / HPF 0-5 0 - 5 RBC/hpf   WBC, UA 0-5 0 - 5 WBC/hpf   Bacteria, UA NONE SEEN NONE SEEN   Squamous Epithelial / HPF 0-5 0 - 5 /HPF    Comment: Performed at [facility], [address] [place], [place] [address]  CBG monitoring, ED     Status: Abnormal   Collection Time: 02/18/23  9:07 PM  Result Value Ref Range   Glucose-Capillary 337 (H) 70 - 99 mg/dL    Comment: Glucose reference range applies only to samples taken after fasting for at least 8 hours.  Troponin I (High Sensitivity)     Status: None   Collection Time: 02/18/23  9:20 PM  Result Value Ref Range   Troponin I (High Sensitivity) 13 <18 ng/L    Comment: (NOTE) Elevated high sensitivity troponin I (hsTnI) values and significant  changes across serial measurements may suggest ACS but many other  chronic and acute conditions are known to elevate hsTnI results.  Refer to the "Links" section for chest pain algorithms and additional  guidance. Performed at [facility], [address] [place], [place] [address]   CBG monitoring, ED     Status: Abnormal   Collection Time: 02/19/23  1:27 AM  Result Value Ref Range   Glucose-Capillary 166 (H) 70 - 99 mg/dL    Comment: Glucose reference range applies only to samples taken after fasting for at least 8 hours.  Troponin I (High Sensitivity)     Status: None   Collection Time:  02/19/23  3:15 AM  Result Value Ref Range   Troponin I (High Sensitivity) 15 <18 ng/L    Comment: (NOTE) Elevated high sensitivity troponin I (hsTnI) values and significant  changes across serial measurements may suggest ACS but many other  chronic and acute conditions are known to elevate hsTnI results.  Refer to the "Links" section for chest pain algorithms and additional  guidance. Performed at [facility], [address] [place], [place] [address]   CBG monitoring, ED     Status: Abnormal   Collection Time: 02/19/23  6:42 AM  Result Value Ref Range   Glucose-Capillary 292 (H) 70 - 99 mg/dL    Comment: Glucose reference range applies only to samples taken after fasting for at least 8 hours.  HgB A1c     Status: Abnormal   Collection Time: 02/25/23  4:05 PM  Result Value Ref Range   Hgb A1c MFr Bld 10.1 (H) 4.6 - 6.5 %    Comment: Glycemic Control Guidelines for People with Diabetes:Non Diabetic:  <6%Goal of Therapy: <7%Additional Action Suggested:  >8%   Basic Metabolic Panel (BMET)     Status: Abnormal   Collection Time: 02/25/23  4:05 PM  Result Value Ref Range   Sodium 139 135 - 145 mEq/L   Potassium 4.4 3.5 - 5.1 mEq/L   Chloride 102 96 - 112 mEq/L   CO2 28 19 - 32 mEq/L   Glucose, Bld 219 (H) 70 - 99 mg/dL   BUN 36 (H) 6 - 23 mg/dL   Creatinine, Ser 3.01 0.40 - 1.50 mg/dL  GFR 57.15 (L) >60.00 mL/min    Comment: Calculated using the CKD-EPI Creatinine Equation (2021)   Calcium 9.8 8.4 - 10.5 mg/dL  CBC with Differential     Status: Abnormal   Collection Time: 02/25/23  8:07 PM  Result Value Ref Range   WBC 7.5 4.0 - 10.5 K/uL   RBC 5.17 4.22 - 5.81 MIL/uL   Hemoglobin 16.1 13.0 - 17.0 g/dL   HCT 16.1 09.6 - 04.5 %   MCV 94.6 80.0 - 100.0 fL   MCH 31.1 26.0 - 34.0 pg   MCHC 32.9 30.0 - 36.0 g/dL   RDW 40.9 81.1 - 91.4 %   Platelets 264 150 - 400 K/uL   nRBC 0.0 0.0 - 0.2 %   Neutrophils Relative % 49 %   Neutro Abs 3.6 1.7 - 7.7 K/uL   Lymphocytes  Relative 32 %   Lymphs Abs 2.4 0.7 - 4.0 K/uL   Monocytes Relative 9 %   Monocytes Absolute 0.7 0.1 - 1.0 K/uL   Eosinophils Relative 8 %   Eosinophils Absolute 0.6 (H) 0.0 - 0.5 K/uL   Basophils Relative 1 %   Basophils Absolute 0.1 0.0 - 0.1 K/uL   Immature Granulocytes 1 %   Abs Immature Granulocytes 0.06 0.00 - 0.07 K/uL    Comment: Performed at [facility], [address] [place], [place] [address]  Comprehensive metabolic panel     Status: Abnormal   Collection Time: 02/25/23  8:07 PM  Result Value Ref Range   Sodium 137 135 - 145 mmol/L   Potassium 3.8 3.5 - 5.1 mmol/L   Chloride 104 98 - 111 mmol/L   CO2 25 22 - 32 mmol/L   Glucose, Bld 210 (H) 70 - 99 mg/dL    Comment: Glucose reference range applies only to samples taken after fasting for at least 8 hours.   BUN 37 (H) 8 - 23 mg/dL   Creatinine, Ser 6.21 (H) 0.61 - 1.24 mg/dL   Calcium 9.1 8.9 - 30.8 mg/dL   Total Protein 7.4 6.5 - 8.1 g/dL   Albumin 4.4 3.5 - 5.0 g/dL   AST 17 15 - 41 U/L   ALT 25 0 - 44 U/L   Alkaline Phosphatase 97 38 - 126 U/L   Total Bilirubin 0.5 0.3 - 1.2 mg/dL   GFR, Estimated 48 (L) >60 mL/min    Comment: (NOTE) Calculated using the CKD-EPI Creatinine Equation (2021)    Anion gap 8 5 - 15    Comment: Performed at [facility], [address] [place], [place] [address]  Brain natriuretic peptide     Status: None   Collection Time: 02/25/23  8:07 PM  Result Value Ref Range   B Natriuretic Peptide 78.7 0.0 - 100.0 pg/mL    Comment: Performed at [facility], [address] [place], [place] [address]  Basic Metabolic Panel (BMET)     Status: Abnormal   Collection Time: 03/01/23  3:55 PM  Result Value Ref Range   Glucose 213 (H) 70 - 99 mg/dL   BUN 29 (H) 8 - 27 mg/dL   Creatinine, Ser 5.28 0.76 - 1.27 mg/dL   eGFR 71 >41 LK/GMW/1.02   BUN/Creatinine Ratio 26 (H) 10 - 24   Sodium 141 134 - 144 mmol/L   Potassium 4.6 3.5 - 5.2 mmol/L   Chloride 105 96 -  106 mmol/L   CO2 20 20 - 29 mmol/L   Calcium 9.0 8.6 - 10.2 mg/dL  Assessment/Plan:  Atherosclerosis of native arteries of extremity with intermittent claudication (HCC) Has already had a major amputation on the left leg and had extensive revascularization about a year and a half ago on the right leg.  Has not been checked in over a year.  Will try to get him in with noninvasive studies in the near future at his convenience.  Continue dual antiplatelet therapy and statin agent.  Chronic systolic heart failure (HCC) Could certainly worsen lower extremity swelling.  Type 2 diabetes mellitus with hyperlipidemia (HCC) blood glucose control important in reducing the progression of atherosclerotic disease. Also, involved in wound healing. On appropriate medications.   CKD (chronic kidney disease) stage 3, GFR 30-59 ml/min Can certainly worsen lower extremity swelling.  Swelling of limb The patient is not nearly as ambulatory as previous and has marked right leg swelling with a litany of medical issues and previous revascularization on the right leg as well.  He has had negative DVT studies, but no functional venous assessment.  A venous reflux study will be done in the near future at his convenience.  I have written him a prescription for compression socks and recommended he wear these daily.      Festus Barren 04/19/2023, 12:37 PM   This note was created with Dragon medical transcription system.  Any errors from dictation are unintentional.

## 2023-04-19 NOTE — Assessment & Plan Note (Signed)
blood glucose control important in reducing the progression of atherosclerotic disease. Also, involved in wound healing. On appropriate medications.  

## 2023-04-19 NOTE — Assessment & Plan Note (Signed)
The patient is not nearly as ambulatory as previous and has marked right leg swelling with a litany of medical issues and previous revascularization on the right leg as well.  He has had negative DVT studies, but no functional venous assessment.  A venous reflux study will be done in the near future at his convenience.  I have written him a prescription for compression socks and recommended he wear these daily.

## 2023-04-19 NOTE — Assessment & Plan Note (Signed)
Could certainly worsen lower extremity swelling

## 2023-04-19 NOTE — Assessment & Plan Note (Signed)
Can certainly worsen lower extremity swelling.

## 2023-04-19 NOTE — Assessment & Plan Note (Signed)
Has already had a major amputation on the left leg and had extensive revascularization about a year and a half ago on the right leg.  Has not been checked in over a year.  Will try to get him in with noninvasive studies in the near future at his convenience.  Continue dual antiplatelet therapy and statin agent.

## 2023-04-24 ENCOUNTER — Ambulatory Visit (INDEPENDENT_AMBULATORY_CARE_PROVIDER_SITE_OTHER): Payer: 59 | Admitting: *Deleted

## 2023-04-24 ENCOUNTER — Ambulatory Visit: Payer: Self-pay

## 2023-04-24 VITALS — Ht 72.0 in | Wt 240.0 lb

## 2023-04-24 DIAGNOSIS — Z0000 Encounter for general adult medical examination without abnormal findings: Secondary | ICD-10-CM

## 2023-04-24 NOTE — Progress Notes (Signed)
Subjective:   Danny Friends Standiford Sr. is a 72 y.o. male who presents for Medicare Annual/Subsequent preventive examination.  Visit Complete: Virtual  I connected with  Danny Dohmen Hardacre Sr. on 04/24/23 by a audio enabled telemedicine application and verified that I am speaking with the correct person using two identifiers.  Patient Location: Home  Provider Location: Office/Clinic  I discussed the limitations of evaluation and management by telemedicine. The patient expressed understanding and agreed to proceed.  Vital Signs: Unable to obtain new vitals due to this being a telehealth visit.   Cardiac Risk Factors include: advanced age (>61men, >54 women);dyslipidemia;male gender;hypertension;sedentary lifestyle;obesity (BMI >30kg/m2);diabetes mellitus;Other (see comment), Risk factor comments: CAD     Objective:    Today's Vitals   04/24/23 1421 04/24/23 1422  Weight: 240 lb (108.9 kg)   Height: 6' (1.829 m)   PainSc:  5    Body mass index is 32.55 kg/m.     04/24/2023    2:46 PM 04/05/2023    1:14 PM 02/25/2023    8:01 PM 02/18/2023    8:56 PM 02/16/2023   11:32 AM 12/20/2022   10:00 PM 12/20/2022    3:38 PM  Advanced Directives  Does Patient Have a Medical Advance Directive? No No No No No No No  Would patient like information on creating a medical advance directive? No - Patient declined No - Patient declined  No - Patient declined  No - Patient declined;No - Guardian declined     Current Medications (verified) Outpatient Encounter Medications as of 04/24/2023  Medication Sig   acetaminophen (TYLENOL) 325 MG tablet Take 2 tablets (650 mg total) by mouth every 4 (four) hours as needed for mild pain (or temp > 37.5 C (99.5 F)).   albuterol (PROVENTIL) (2.5 MG/3ML) 0.083% nebulizer solution Take 3 mLs (2.5 mg total) by nebulization every 6 (six) hours as needed for wheezing or shortness of breath.   albuterol (VENTOLIN HFA) 108 (90 Base) MCG/ACT inhaler Inhale 2 puffs into the lungs  every 6 (six) hours as needed for wheezing or shortness of breath.   Albuterol Sulfate (PROAIR RESPICLICK) 108 (90 Base) MCG/ACT AEPB Inhale 1 puff into the lungs every 6 (six) hours as needed.   amLODipine (NORVASC) 10 MG tablet Take 1 tablet (10 mg total) by mouth daily.   aspirin EC 81 MG tablet Take 1 tablet (81 mg total) by mouth daily.   atorvastatin (LIPITOR) 80 MG tablet Take 1 tablet (80 mg total) by mouth daily.   carvedilol (COREG) 25 MG tablet Take 1 tablet (25 mg total) by mouth 2 (two) times daily with a meal.   clopidogrel (PLAVIX) 75 MG tablet TAKE 1 TABLET BY MOUTH ONCE DAILY   Continuous Blood Gluc Receiver (FREESTYLE LIBRE 2 READER) DEVI 1 Device by Does not apply route 4 (four) times daily.   Continuous Glucose Sensor (FREESTYLE LIBRE 2 SENSOR) MISC Check blood sugars   Dulaglutide (TRULICITY) 4.5 MG/0.5ML SOPN Inject 4.5 mg into the skin once a week.   empagliflozin (JARDIANCE) 25 MG TABS tablet Take 1 tablet (25 mg total) by mouth daily.   escitalopram (LEXAPRO) 10 MG tablet Take 1 tablet (10 mg total) by mouth daily.   MOUNJARO 5 MG/0.5ML Pen Inject 5 mg into the skin once a week.   nitroGLYCERIN (NITROSTAT) 0.4 MG SL tablet Place 0.4 mg under the tongue every 5 (five) minutes as needed for chest pain.   spironolactone (ALDACTONE) 25 MG tablet TAKE 1 TABLET (25 MG  TOTAL) BY MOUTH DAILY.   tamsulosin (FLOMAX) 0.4 MG CAPS capsule Take 1 capsule (0.4 mg total) by mouth daily.   umeclidinium-vilanterol (ANORO ELLIPTA) 62.5-25 MCG/ACT AEPB Inhale 1 puff into the lungs daily.   furosemide (LASIX) 20 MG tablet Take 1 tablet (20 mg total) by mouth daily for 7 days. (Patient not taking: Reported on 03/25/2023)   No facility-administered encounter medications on file as of 04/24/2023.    Allergies (verified) Metformin and related   History: Past Medical History:  Diagnosis Date   CAD (coronary artery disease)    a. 01/2010 PCI of LAD; b. 09/2014 PCI/DES of mLAD due to ISR. D1  80, D2 80(jailed), LCX 31m; c. 07/2016 NSTEMI/Cath: LM nl, LAD 43m ISR, 40d, D1 90ost, 80p, D2 90ost, RI min irregs, LCX min irregs, OM1 90 small, OM2/3 min irregs, RCA min irregs, RPLB1 90, EF 25-35%.   Chronic combined systolic and diastolic CHF (congestive heart failure) (HCC)    a. 07/2016 Echo: EF 30%, severe septal/anterior HK, Gr1 DD.   CKD (chronic kidney disease), stage III (HCC)    COPD (chronic obstructive pulmonary disease) (HCC)    DM2 (diabetes mellitus, type 2) (HCC)    Erectile dysfunction    HLD (hyperlipidemia)    HTN (hypertension)    Ischemic cardiomyopathy    a. 07/2016 Echo: EF 30% w/ sev septal/ant HK. Gr1 DD.   Past Surgical History:  Procedure Laterality Date   AMPUTATION Left 11/10/2020   Procedure: AMPUTATION BELOW KNEE;  Surgeon: Annice Needy, MD;  Location: [facility] ORS;  Service: General;  Laterality: Left;   BELOW KNEE LEG AMPUTATION     CAD: stent to the LAD     CARDIAC CATHETERIZATION  10/01/2014   CARDIAC CATHETERIZATION N/A 07/23/2016   Procedure: Left Heart Cath and Coronary Angiography;  Surgeon: Iran Ouch, MD;  Location: [facility] INVASIVE CV LAB;  Service: Cardiovascular;  Laterality: N/A;   CORONARY ANGIOPLASTY WITH STENT PLACEMENT  10/01/2014   LOWER EXTREMITY ANGIOGRAPHY Left 10/31/2020   Procedure: LOWER EXTREMITY ANGIOGRAPHY;  Surgeon: Annice Needy, MD;  Location: [facility] INV[facility] CV [facility];  Service: Cardiovascular;  Laterality: Left;   LOWER EXTREMITY ANGIOGRAPHY Right 11/20/2021   Procedure: Lower Extremity Angiography;  Surgeon: Annice Needy, MD;  Location: [facility] INV[facility] CV [facility];  Service: Cardiovascular;  Laterality: Right;   Family History  Problem Relation Age of Onset   Heart attack Father        complications   Cancer Brother    Social History   Socioeconomic History   Marital status: Single    Spouse name: Not on file   Number of children: Not on file   Years of education: Not on file   Highest education level: Not on file  Occupational  History   Not on file  Tobacco Use   Smoking status: Former    Current packs/day: 0.00    Types: Cigarettes    Start date: 09/07/2009    Quit date: 09/07/2009    Years since quitting: 13.6   Smokeless tobacco: Never   Tobacco comments:    quit june 2011  Vaping Use   Vaping status: Never Used  Substance and Sexual Activity   Alcohol use: Not Currently    Alcohol/week: 7.0 standard drinks of alcohol    Types: 6 Cans of beer, 1 Standard drinks or equivalent per week    Comment: last week 07/26/2022   Drug use: Never   Sexual activity: Not Currently  Other Topics  Concern   Not on file  Social History Narrative   divorced   Social Determinants of Health   Financial Resource Strain: Low Risk  (04/24/2023)   Overall Financial Resource Strain (CARDIA)    Difficulty of Paying Living Expenses: Not hard at all  Food Insecurity: No Food Insecurity (04/24/2023)   Hunger Vital Sign    Worried About Running Out of Food in the Last Year: Never true    Ran Out of Food in the Last Year: Never true  Transportation Needs: No Transportation Needs (04/24/2023)   PRAPARE - Administrator, Civil Service (Medical): No    Lack of Transportation (Non-Medical): No  Physical Activity: Inactive (04/24/2023)   Exercise Vital Sign    Days of Exercise per Week: 0 days    Minutes of Exercise per Session: 0 min  Stress: Stress Concern Present (04/24/2023)   [employer] of Occupational Health - Occupational Stress Questionnaire    Feeling of Stress : To some extent  Social Connections: Socially Isolated (04/24/2023)   Social Connection and Isolation Panel [NHANES]    Frequency of Communication with Friends and Family: More than three times a week    Frequency of Social Gatherings with Friends and Family: More than three times a week    Attends Religious Services: Never    Database administrator or Organizations: No    Attends Engineer, structural: Never    Marital Status: Divorced     Tobacco Counseling Counseling given: Not Answered Tobacco comments: quit june 2011   Clinical Intake:  Pre-visit preparation completed: Yes  Pain : No/denies pain Pain Score: 5  Pain Location: Foot Pain Orientation: Right Pain Descriptors / Indicators: Aching     BMI - recorded: 32.55 Nutritional Status: BMI > 30  Obese Nutritional Risks: Nausea/ vomitting/ diarrhea (diarrhea off and on for years) Diabetes: Yes CBG done?: No Did pt. bring in CBG monitor from home?: No  How often do you need to have someone help you when you read instructions, pamphlets, or other written materials from your doctor or pharmacy?: 5 - Always  Interpreter Needed?: No  Information entered by :: R. Meryl Hubers LPN   Activities of Daily Living    04/24/2023    2:29 PM 12/20/2022   10:00 PM  In your present state of health, do you have any difficulty performing the following activities:  Hearing? 1 0  Vision? 0 0  Comment glasses   Difficulty concentrating or making decisions? 1 1  Walking or climbing stairs? 1 1  Comment wheelchair   Dressing or bathing? 1 1  Comment bathing   Doing errands, shopping? 1 1  Preparing Food and eating ? Y   Using the Toilet? N   In the past six months, have you accidently leaked urine? N   Do you have problems with loss of bowel control? Y   Managing your Medications? Y   Managing your Finances? Y   Housekeeping or managing your Housekeeping? Y     Patient Care Team: [name], MD as PCP - General (Family Medicine) Mariah Milling Tollie Pizza, MD as PCP - Cardiology (Cardiology) [name], MD as Consulting Physician (Cardiology) Otho Ket, RN as [facility] Clearance Coots, Janace Litten, RPH-CPP (Pharmacist) [place], [place], [place] as Social Worker  Indicate any recent Medical Services you may have received from other than [facility] providers in the past year (date may be approximate).     Assessment:  This is a routine  wellness examination for Danny [name].  Hearing/Vision screen Hearing Screening - Comments:: Difficulty hearing Vision Screening - Comments:: glasses   Goals Addressed             This Visit's Progress    Patient Stated       Wants to get out of the house more, working on getting an electric wheelchair       Depression Screen    04/24/2023    2:40 PM 02/25/2023    3:27 PM 10/31/2022    2:59 PM 09/12/2022    3:40 PM 04/27/2022   11:12 AM 12/12/2021   11:48 AM 12/08/2020   12:48 PM  PHQ 2/9 Scores  PHQ - 2 Score 1 0 0 0 0 0 0  PHQ- 9 Score 8 0         Fall Risk    04/24/2023    2:31 PM 02/26/2023    2:15 PM 02/25/2023    3:27 PM 09/12/2022    3:40 PM 04/27/2022   11:12 AM  Fall Risk   Falls in the past year? 1 1 1  0 0  Number falls in past yr: 1  1 0 0  Injury with Fall? 1  1 0 0  Comment cracked ribs      Risk for fall due to : History of fall(s);Impaired mobility;Impaired balance/gait History of fall(s) History of fall(s) No Fall Risks No Fall Risks  Follow up Falls evaluation completed;Falls prevention discussed;Education provided  Falls evaluation completed Falls evaluation completed Falls evaluation completed    MEDICARE RISK AT HOME: Medicare Risk at Home Any stairs in or around the home?: Yes If so, are there any without handrails?: No (ramp) Home free of loose throw rugs in walkways, pet beds, electrical cords, etc?: Yes Adequate lighting in your home to reduce risk of falls?: Yes Life alert?: No Use of a cane, walker or w/c?: Yes (walker) Grab bars in the bathroom?: No Shower chair or bench in shower?: Yes Elevated toilet seat or a handicapped toilet?: No      Cognitive Function:        04/24/2023    2:47 PM 12/08/2020   12:54 PM 12/08/2019   11:53 AM 03/20/2018    9:46 AM  6CIT Screen  What Year? 0 points 0 points 0 points 0 points  What month? 0 points 0 points 0 points 0 points  What time? 0 points 0 points 0 points 0 points  Count back from 20 0 points 0  points 0 points 0 points  Months in reverse 4 points 0 points 4 points 4 points  Repeat phrase 10 points  4 points   Total Score 14 points  8 points     Immunizations Immunization History  Administered Date(s) Administered   Fluad Quad(high Dose 65+) 08/02/2019, 09/30/2020, 08/24/2021, 04/27/2022   Influenza, High Dose Seasonal PF 09/01/2018   Moderna Sars-Covid-2 Vaccination 04/07/2020, 05/09/2020   Pneumococcal Polysaccharide-23 08/02/2019   Tdap 11/28/2020    TDAP status: Up to date  Flu Vaccine status: Due, Education has been provided regarding the importance of this vaccine. Advised may receive this vaccine at local pharmacy or Health Dept. Aware to provide a copy of the vaccination record if obtained from local pharmacy or Health Dept. Verbalized acceptance and understanding.  Pneumococcal vaccine status: Due, Education has been provided regarding the importance of this vaccine. Advised may receive this vaccine at local pharmacy or Health Dept. Aware to provide a copy of  the vaccination record if obtained from local pharmacy or Health Dept. Verbalized acceptance and understanding.  Covid-19 vaccine status: Information provided on how to obtain vaccines.   Qualifies for Shingles Vaccine? Yes   Zostavax completed No   Shingrix Completed?: No.    Education has been provided regarding the importance of this vaccine. Patient has been advised to call insurance company to determine out of pocket expense if they have not yet received this vaccine. Advised may also receive vaccine at local pharmacy or Health Dept. Verbalized acceptance and understanding.  Screening Tests Health Maintenance  Topic Date Due   Diabetic kidney evaluation - Urine ACR  Never done   Zoster Vaccines- Shingrix (1 of 2) Never done   Colonoscopy  Never done   COVID-19 Vaccine (3 - Moderna risk series) 06/06/2020   Pneumonia Vaccine 40+ Years old (2 of 2 - PCV) 08/01/2020   OPHTHALMOLOGY EXAM  11/04/2021    Medicare Annual Wellness (AWV)  12/13/2022   INFLUENZA VACCINE  03/07/2023   HEMOGLOBIN A1C  08/28/2023   FOOT EXAM  02/25/2024   Diabetic kidney evaluation - eGFR measurement  02/29/2024   DTaP/Tdap/Td (2 - Td or Tdap) 11/29/2030   Hepatitis C Screening  Completed   HPV VACCINES  Aged Out    Health Maintenance  Health Maintenance Due  Topic Date Due   Diabetic kidney evaluation - Urine ACR  Never done   Zoster Vaccines- Shingrix (1 of 2) Never done   Colonoscopy  Never done   COVID-19 Vaccine (3 - Moderna risk series) 06/06/2020   Pneumonia Vaccine 108+ Years old (2 of 2 - PCV) 08/01/2020   OPHTHALMOLOGY EXAM  11/04/2021   Medicare Annual Wellness (AWV)  12/13/2022   INFLUENZA VACCINE  03/07/2023    Colorectal Cancer Screening Status  Patient declines  Lung Cancer Screening: (Low Dose CT Chest recommended if Age 34-80 years, 20 pack-year currently smoking OR have quit w/in 15years.) does not qualify.     Additional Screening:  Hepatitis C Screening: does qualify; Completed 03/2018  Vision Screening: Recommended annual ophthalmology exams for early detection of glaucoma and other disorders of the eye. Is the patient up to date with their annual eye exam?  Yes  Who is the provider or what is the name of the office in which the patient attends annual eye exams? [facility] If pt is not established with a provider, would they like to be referred to a provider to establish care? No .  Order placed 02/2023 for a Diabetic Eye Exam Dental Screening: Recommended annual dental exams for proper oral hygiene  Diabetic Foot Exam: Diabetic Foot Exam: Completed 02/2023  Community Resource Referral / Chronic Care Management: CRR required this visit?  No   CCM required this visit?  No     Plan:     I have personally reviewed and noted the following in the patient's chart:   Medical and social history Use of alcohol, tobacco or illicit drugs  Current medications and  supplements including opioid prescriptions. Patient is not currently taking opioid prescriptions. Functional ability and status Nutritional status Physical activity Advanced directives List of other physicians Hospitalizations, surgeries, and ER visits in previous 12 months Vitals Screenings to include cognitive, depression, and falls Referrals and appointments  In addition, I have reviewed and discussed with patient certain preventive protocols, quality metrics, and best practice recommendations. A written personalized care plan for preventive services as well as general preventive health recommendations were provided to patient.  Sydell Axon, LPN   [id number]   After Visit Summary: (MyChart) Due to this being a telephonic visit, the after visit summary with patients personalized plan was offered to patient via MyChart   Nurse Notes: Patient's son helped with the visit.

## 2023-04-24 NOTE — Patient Instructions (Signed)
Mr. Trobaugh , Thank you for taking time to come for your Medicare Wellness Visit. I appreciate your ongoing commitment to your health goals. Please review the following plan we discussed and let me know if I can assist you in the future.   Referrals/Orders/Follow-Ups/Clinician Recommendations: Patient advised to get vaccines updated, Flu, Pneumonia and Covid  This is a list of the screening recommended for you and due dates:  Health Maintenance  Topic Date Due   Yearly kidney health urinalysis for diabetes  Never done   Zoster (Shingles) Vaccine (1 of 2) Never done   Colon Cancer Screening  Never done   COVID-19 Vaccine (3 - Moderna risk series) 06/06/2020   Pneumonia Vaccine (2 of 2 - PCV) 08/01/2020   Eye exam for diabetics  11/04/2021   Flu Shot  03/07/2023   Hemoglobin A1C  08/28/2023   Complete foot exam   02/25/2024   Yearly kidney function blood test for diabetes  02/29/2024   Medicare Annual Wellness Visit  04/23/2024   DTaP/Tdap/Td vaccine (2 - Td or Tdap) 11/29/2030   Hepatitis C Screening  Completed   HPV Vaccine  Aged Out    Advanced directives: (Declined) Advance directive discussed with you today. Even though you declined this today, please call our office should you change your mind, and we can give you the proper paperwork for you to fill out.  Next Medicare Annual Wellness Visit scheduled for next year: Yes 04/29/24 #@ 2:30

## 2023-04-25 DIAGNOSIS — R531 Weakness: Secondary | ICD-10-CM | POA: Diagnosis not present

## 2023-04-25 NOTE — Patient Outreach (Signed)
Care Coordination   Follow Up Visit Note   04/26/2023 Name: Danny Ruston Gillentine Sr. MRN: [id number] DOB: 1951/04/03  Danny Friends Meek Sr. is a 72 y.o. year old male who sees [name], Yehuda Mao, MD for primary care. I spoke with  Danny Scrape Sr. by phone today.  What matters to the patients health and wellness today?  Son states patient received approval letter for aide assistance 3 days per week.  He states he was not able to notify the social worker that approval letter was received.  Son states patients wheelchair evaluation was completed on 04/05/23.  Son states patient continues to have swelling in right LE.  He states he still needs to pick up patients compression hose.  Son states he is currently living with patient but plans to move into his wife's home. He states patient is upset about this.  Son states his plan is to hopefully convince patient to move with him.   Goals Addressed             This Visit's Progress    continued improvement post hospitalization and obtain community resource needs.       Interventions Today    Flowsheet Row Most Recent Value  Chronic Disease   Chronic disease during today's visit Chronic Obstructive Pulmonary Disease (COPD)  General Interventions   General Interventions Discussed/Reviewed General Interventions Reviewed, Doctor Visits  [evaluation of current treatment plan for mentioned health conditions and patients adherence to plan as established by provide.  Assessed for COPD symptoms.]  Doctor Visits Discussed/Reviewed Doctor Visits Reviewed  Danny [name] upcoming provider visits. Advised to keep follow up appointments as recommended. Provided son phone number to contact social worker.]  Education Interventions   Education Provided Provided Education  [Advised son to contact RN case manager if he does not hear back regarding patients wheelchair. Advised to apply compression hose to right leg as recommended.]  Pharmacy Interventions   Pharmacy  Dicussed/Reviewed Pharmacy Topics Reviewed  Danny [name] to take medications as prescribed.]              SDOH assessments and interventions completed:  No     Care Coordination Interventions:  Yes, provided   Follow up plan: Follow up call scheduled for 05/23/23    Encounter Outcome:  Patient Visit Completed   George Ina RN,BSN,CCM [facility] Care Coordination [phone] direct line

## 2023-04-25 NOTE — Telephone Encounter (Signed)
Patient's son called and said they need a signature from Dr. [name] to [facility] [employer]. The fax number is [phone]

## 2023-04-26 NOTE — Patient Instructions (Signed)
Visit Information  Thank you for taking time to visit with me today. Please don't hesitate to contact me if I can be of assistance to you.   Following are the goals we discussed today:   Goals Addressed             This Visit's Progress    continued improvement post hospitalization and obtain community resource needs.       Interventions Today    Flowsheet Row Most Recent Value  Chronic Disease   Chronic disease during today's visit Chronic Obstructive Pulmonary Disease (COPD)  General Interventions   General Interventions Discussed/Reviewed General Interventions Reviewed, Doctor Visits  [evaluation of current treatment plan for mentioned health conditions and patients adherence to plan as established by provide.  Assessed for COPD symptoms.]  Doctor Visits Discussed/Reviewed Doctor Visits Reviewed  Annabell Sabal upcoming provider visits. Advised to keep follow up appointments as recommended. Provided son phone number to contact social worker.]  Education Interventions   Education Provided Provided Education  [Advised son to contact RN case manager if he does not hear back regarding patients wheelchair. Advised to apply compression hose to right leg as recommended.]  Pharmacy Interventions   Pharmacy Dicussed/Reviewed Pharmacy Topics Reviewed  Algis Downs to take medications as prescribed.]              Our next appointment is by telephone on 05/23/23 at 1:30 pm  Please call the care guide team at [phone] if you need to cancel or reschedule your appointment.   If you are experiencing a Mental Health or Behavioral Health Crisis or need someone to talk to, please call the Suicide and Crisis Lifeline: 988 call 1-800-273-TALK (toll free, 24 hour hotline)  Patient verbalizes understanding of instructions and care plan provided today and agrees to view in MyChart. Active MyChart status and patient understanding of how to access instructions and care plan via MyChart confirmed with  patient.     George Ina RN,BSN,CCM [facility] Care Coordination [phone] direct line

## 2023-04-29 ENCOUNTER — Ambulatory Visit: Payer: 59 | Admitting: Family Medicine

## 2023-04-30 NOTE — Telephone Encounter (Signed)
The [employer] paperwork was faxed on 04/29/23 and we received an okay confirmation.

## 2023-05-09 ENCOUNTER — Telehealth: Payer: Self-pay | Admitting: Family Medicine

## 2023-05-09 NOTE — Telephone Encounter (Signed)
Pharmacy called stating they need a new script  for Humalog sent to them

## 2023-05-10 ENCOUNTER — Other Ambulatory Visit: Payer: Self-pay

## 2023-05-10 NOTE — Telephone Encounter (Signed)
Danny [name]. States he Dad is still on the Humalog .

## 2023-05-10 NOTE — Telephone Encounter (Signed)
I believe he is only on jardiance and trulicity. Please call the patient and/or his son and see if he has been on humalog as well. Thanks.

## 2023-05-10 NOTE — Telephone Encounter (Signed)
Danny [name]. States he has not been having sugar issues so he has not been taking a lot. I asked for Jermie Hippe. He stated we are not together now. He sounded really tired or messed up.

## 2023-05-13 NOTE — Telephone Encounter (Signed)
Noted.  At this time I do not believe he is supposed to be on the Humalog.  If he is not taking it very frequently then I do not think he needs to continue with it at this time.

## 2023-05-13 NOTE — Telephone Encounter (Signed)
Spoke to son Keric Zehren. And let him know that if the Patient is not taking the Humalog very frequently then Dr. [name] states he does not need to be on it at this time. Greer Pickerel. Understands and is agreeable.

## 2023-05-14 ENCOUNTER — Telehealth: Payer: Self-pay

## 2023-05-14 DIAGNOSIS — E1169 Type 2 diabetes mellitus with other specified complication: Secondary | ICD-10-CM

## 2023-05-14 MED ORDER — FREESTYLE LIBRE 2 READER DEVI
1.0000 | Freq: Four times a day (QID) | 0 refills | Status: AC
Start: 2023-05-14 — End: ?

## 2023-05-14 NOTE — Telephone Encounter (Signed)
Patient's son, Nasiir, Monts., called to state patient's puppy chewed up patient's Freestyle Libre 2 Reader.  Knowledge, patient's son, states they would like to know how to go about getting another one because patient needs this.

## 2023-05-14 NOTE — Telephone Encounter (Signed)
I have sent another 1 in for him to the pharmacy though they may just be able to call the manufacturer and have them replace it.

## 2023-05-16 ENCOUNTER — Other Ambulatory Visit (INDEPENDENT_AMBULATORY_CARE_PROVIDER_SITE_OTHER): Payer: Self-pay | Admitting: Vascular Surgery

## 2023-05-16 NOTE — Telephone Encounter (Signed)
Spoke with Danny [name]. To let him know that we sent a prescription to the Pharmacy and he may be able to contact the manufacturer and get a replacement. Danny [name]. States he will try the manufacturer first.

## 2023-05-19 ENCOUNTER — Other Ambulatory Visit: Payer: Self-pay

## 2023-05-19 ENCOUNTER — Emergency Department
Admission: EM | Admit: 2023-05-19 | Discharge: 2023-05-20 | Disposition: A | Discharge status: Home / Self Care | Payer: 59 | Attending: Emergency Medicine | Admitting: Emergency Medicine

## 2023-05-19 DIAGNOSIS — I2510 Atherosclerotic heart disease of native coronary artery without angina pectoris: Secondary | ICD-10-CM | POA: Insufficient documentation

## 2023-05-19 DIAGNOSIS — I130 Hypertensive heart and chronic kidney disease with heart failure and stage 1 through stage 4 chronic kidney disease, or unspecified chronic kidney disease: Secondary | ICD-10-CM | POA: Diagnosis not present

## 2023-05-19 DIAGNOSIS — E108 Type 1 diabetes mellitus with unspecified complications: Secondary | ICD-10-CM | POA: Diagnosis not present

## 2023-05-19 DIAGNOSIS — E1165 Type 2 diabetes mellitus with hyperglycemia: Secondary | ICD-10-CM | POA: Insufficient documentation

## 2023-05-19 DIAGNOSIS — E1122 Type 2 diabetes mellitus with diabetic chronic kidney disease: Secondary | ICD-10-CM | POA: Diagnosis not present

## 2023-05-19 DIAGNOSIS — R739 Hyperglycemia, unspecified: Secondary | ICD-10-CM

## 2023-05-19 DIAGNOSIS — J449 Chronic obstructive pulmonary disease, unspecified: Secondary | ICD-10-CM | POA: Insufficient documentation

## 2023-05-19 DIAGNOSIS — N189 Chronic kidney disease, unspecified: Secondary | ICD-10-CM | POA: Insufficient documentation

## 2023-05-19 DIAGNOSIS — H579 Unspecified disorder of eye and adnexa: Secondary | ICD-10-CM | POA: Diagnosis not present

## 2023-05-19 DIAGNOSIS — I509 Heart failure, unspecified: Secondary | ICD-10-CM | POA: Diagnosis not present

## 2023-05-19 DIAGNOSIS — R6889 Other general symptoms and signs: Secondary | ICD-10-CM | POA: Diagnosis not present

## 2023-05-19 DIAGNOSIS — Z743 Need for continuous supervision: Secondary | ICD-10-CM | POA: Diagnosis not present

## 2023-05-19 LAB — CBC
HCT: 48.7 % (ref 39.0–52.0)
Hemoglobin: 15.8 g/dL (ref 13.0–17.0)
MCH: 30.9 pg (ref 26.0–34.0)
MCHC: 32.4 g/dL (ref 30.0–36.0)
MCV: 95.3 fL (ref 80.0–100.0)
Platelets: 240 10*3/uL (ref 150–400)
RBC: 5.11 MIL/uL (ref 4.22–5.81)
RDW: 13 % (ref 11.5–15.5)
WBC: 7.9 10*3/uL (ref 4.0–10.5)
nRBC: 0 % (ref 0.0–0.2)

## 2023-05-19 LAB — BASIC METABOLIC PANEL
Anion gap: 11 (ref 5–15)
BUN: 37 mg/dL — ABNORMAL HIGH (ref 8–23)
CO2: 24 mmol/L (ref 22–32)
Calcium: 9 mg/dL (ref 8.9–10.3)
Chloride: 102 mmol/L (ref 98–111)
Creatinine, Ser: 1.43 mg/dL — ABNORMAL HIGH (ref 0.61–1.24)
GFR, Estimated: 52 mL/min — ABNORMAL LOW (ref >60)
Glucose, Bld: 369 mg/dL — ABNORMAL HIGH (ref 70–99)
Potassium: 4.2 mmol/L (ref 3.5–5.1)
Sodium: 137 mmol/L (ref 135–145)

## 2023-05-19 LAB — URINALYSIS, ROUTINE W REFLEX MICROSCOPIC
Bacteria, UA: NONE SEEN
Bilirubin Urine: NEGATIVE
Glucose, UA: >=500 mg/dL — AB
Hgb urine dipstick: NEGATIVE
Ketones, ur: NEGATIVE mg/dL
Leukocytes,Ua: NEGATIVE
Nitrite: NEGATIVE
Protein, ur: NEGATIVE mg/dL
Specific Gravity, Urine: 1.027 (ref 1.005–1.030)
Squamous Epithelial / HPF: 0 /[HPF] (ref 0–5)
pH: 5 (ref 5.0–8.0)

## 2023-05-19 LAB — CBG MONITORING, ED: Glucose-Capillary: 191 mg/dL — ABNORMAL HIGH (ref 70–99)

## 2023-05-19 MED ORDER — INSULIN ASPART 100 UNIT/ML IJ SOLN
5.0000 [IU] | Freq: Once | INTRAMUSCULAR | Status: AC
  Administered 2023-05-19: 5 [IU] via SUBCUTANEOUS
  Filled 2023-05-19: qty 1

## 2023-05-19 NOTE — ED Triage Notes (Signed)
Pt comes via [facility] from home with c/o hyperglycemia. Pt states his dog got a hold of his insulin bottle and he hasn't had his insulin. Pt denies any pain anywhere.

## 2023-05-19 NOTE — ED Triage Notes (Signed)
First Nurse Note: Pt via [facility] from home. Dog got into his insulin, so he does not have anymore insulin. CBG 461, 162/83 BP, 75 HR, 96% on RA. Pt is A&Ox4 and NAD

## 2023-05-19 NOTE — ED Provider Notes (Signed)
[facility] Provider Note    Event Date/Time   First MD Initiated Contact with Patient 05/19/23 2024     (approximate)   History   Chief Complaint Hyperglycemia   HPI  Danny URBAS Sr. is a 71 y.o. male with past medical history of hypertension, diabetes, CAD, CHF, CKD, and COPD who presents to the ED complaining of hyperglycemia.  Patient reports that his blood sugar has been running high today despite taking his medications as prescribed.  He initially reported that his dog had taken his insulin in triage, but he now clarifies that he does not take insulin and his PCP took him off of Mounjaro recently.  He states he has been taking his other medications as prescribed, reports feeling dizzy and lightheaded earlier today but now feels better.  He has not had any fevers, cough, chest pain, shortness of breath, nausea, vomiting, diarrhea, or dysuria.     Physical Exam   Triage Vital Signs: ED Triage Vitals  Encounter Vitals Group     BP 05/19/23 1733 133/81     Systolic BP Percentile --      Diastolic BP Percentile --      Pulse Rate 05/19/23 1733 76     Resp 05/19/23 1733 18     Temp 05/19/23 1733 98 F (36.7 C)     Temp src --      SpO2 05/19/23 1733 95 %     Weight --      Height --      Head Circumference --      Peak Flow --      Pain Score 05/19/23 1732 0     Pain Loc --      Pain Education --      Exclude from Growth Chart --     Most recent vital signs: Vitals:   05/19/23 2159 05/19/23 2201  BP:  (!) 125/100  Pulse: 71 71  Resp: 18 17  Temp:  98.2 F (36.8 C)  SpO2: 96% 96%    Constitutional: Alert and oriented. Eyes: Conjunctivae are normal. Head: Atraumatic. Nose: No congestion/rhinnorhea. Mouth/Throat: Mucous membranes are moist.  Cardiovascular: Normal rate, regular rhythm. Grossly normal heart sounds.  2+ radial pulses bilaterally. Respiratory: Normal respiratory effort.  No retractions. Lungs CTAB. Gastrointestinal:  Soft and nontender. No distention. Musculoskeletal: No lower extremity tenderness nor edema, status post left BKA. Neurologic:  Normal speech and language. No gross focal neurologic deficits are appreciated.    ED Results / Procedures / Treatments   Labs (all labs ordered are listed, but only abnormal results are displayed) Labs Reviewed  BASIC METABOLIC PANEL - Abnormal; Notable for the following components:      Result Value   Glucose, Bld 369 (*)    BUN 37 (*)    Creatinine, Ser 1.43 (*)    GFR, Estimated 52 (*)    All other components within normal limits  URINALYSIS, ROUTINE W REFLEX MICROSCOPIC - Abnormal; Notable for the following components:   Color, Urine STRAW (*)    APPearance CLEAR (*)    Glucose, UA >=500 (*)    All other components within normal limits  CBG MONITORING, ED - Abnormal; Notable for the following components:   Glucose-Capillary 191 (*)    All other components within normal limits  CBC  CBG MONITORING, ED     EKG  ED ECG REPORT I, Chesley Noon, the attending physician, personally viewed and interpreted this ECG.  Date: 05/19/2023  EKG Time: 21:57  Rate: 65  Rhythm: normal sinus rhythm  Axis: Normal  Intervals:none  ST&T Change: None  PROCEDURES:  Critical Care performed: No  Procedures   MEDICATIONS ORDERED IN ED: Medications  insulin aspart (novoLOG) injection 5 Units (5 Units Subcutaneous Given 05/19/23 2148)     IMPRESSION / MDM / ASSESSMENT AND PLAN / ED COURSE  I reviewed the triage vital signs and the nursing notes.                              72 y.o. male with past medical history of hypertension, diabetes, CAD, CHF, CKD, and COPD who presents to the ED complaining of hyperglycemia noted today at home despite taking his medications as prescribed.  Patient's presentation is most consistent with acute presentation with potential threat to life or bodily function.  Differential diagnosis includes, but is not limited  to, hyperglycemia, DKA, AKI, electrolyte abnormality, UTI, dehydration.  Patient chronically ill but nontoxic-appearing and in no acute distress, vital signs are unremarkable.  Labs show mild hyperglycemia but no evidence of DKA, AKI, or electrolyte abnormality.  No significant anemia or leukocytosis noted, patient does not describe any symptoms concerning for infection contributing to his hyperglycemia.  We will screen urinalysis, give dose of subcutaneous insulin for his hyperglycemia.  Blood glucose improved following dose of insulin, urinalysis shows no signs of infection.  Patient appropriate for discharge home with PCP follow-up for any adjustments in his diabetic regimen.  He was counseled to return to the ED for new or worsening symptoms, patient agrees with plan.      FINAL CLINICAL IMPRESSION(S) / ED DIAGNOSES   Final diagnoses:  Hyperglycemia     Rx / DC Orders   ED Discharge Orders     None        Note:  This document was prepared using Dragon voice recognition software and may include unintentional dictation errors.   Chesley Noon, MD 05/19/23 2232

## 2023-05-19 NOTE — ED Notes (Signed)
CALLED [facility] SPOKE WITH REP. SCOTT, REP STATED NOTHING IS ON THE BOARD AND A TRUCK IS ON THE WAY TO PICK THE PT. UP TO TAKE HIM HOME TO THE ADDRESS ON THE MEDICAL NECESSITY AND IN THE MEDICAL [address] [place] [address].

## 2023-05-19 NOTE — ED Notes (Signed)
Lab called and come and get blood from pt

## 2023-05-20 DIAGNOSIS — Z743 Need for continuous supervision: Secondary | ICD-10-CM | POA: Diagnosis not present

## 2023-05-20 DIAGNOSIS — Z7401 Bed confinement status: Secondary | ICD-10-CM | POA: Diagnosis not present

## 2023-05-20 DIAGNOSIS — R739 Hyperglycemia, unspecified: Secondary | ICD-10-CM | POA: Diagnosis not present

## 2023-05-20 NOTE — ED Notes (Signed)
Discharge instructions reviewed with patient. Patient questions answered and opportunity for education reviewed. Patient voices understanding of discharge instructions with no further question.

## 2023-05-20 NOTE — ED Notes (Signed)
called to [facility] for transport update on pt /rep stated that they will send truck out after [facility] calls.

## 2023-05-23 ENCOUNTER — Ambulatory Visit: Payer: Self-pay

## 2023-05-23 DIAGNOSIS — Z9181 History of falling: Secondary | ICD-10-CM | POA: Diagnosis not present

## 2023-05-23 DIAGNOSIS — Z89512 Acquired absence of left leg below knee: Secondary | ICD-10-CM | POA: Diagnosis not present

## 2023-05-23 DIAGNOSIS — J449 Chronic obstructive pulmonary disease, unspecified: Secondary | ICD-10-CM | POA: Diagnosis not present

## 2023-05-23 NOTE — Patient Outreach (Signed)
Care Coordination   Follow Up Visit Note   05/23/2023 Name: Danny Sedore Legrand Sr. MRN: [id number] DOB: 1951-01-01  Danny Friends Stanco Sr. is a 72 y.o. year old male who sees Danny [name], Danny Mao, MD for primary care. I  spoke with patients son, Danny [name].  What matters to the patients health and wellness today?  Per chart review patient in ED on 05/19/23 due to hyperglycemia.  Son states patient ran out of his insulin and didn't have his  Free style Libre sensor prior to ED visit. He states patient is now wearing his sensor.  Son states he thought patient had a sliding scale insulin to use for meals however unable to obtain.  He states patient currently only has mounjaro and Jardiance.  Son verbally agreed to allow [facility] to schedule patient follow up visit with primary care provider for a post ED visit follow up.     Goals Addressed             This Visit's Progress    continued improvement post hospitalization/ management of health conditions and obtain community resource needs.       Interventions Today    Flowsheet Row Most Recent Value  Chronic Disease   Chronic disease during today's visit Diabetes  General Interventions   General Interventions Discussed/Reviewed General Interventions Reviewed, Doctor Visits, Labs  [evaluation of current treatment plan for diabetes and patients adherence to plan as established by provider.  Assessed for BS readings.]  Labs Hgb A1c every 3 months  Doctor Visits Discussed/Reviewed Doctor Visits Reviewed  Danny [name] son that patient needs to have follow up appointment with primary care provider post ED visit and as recommended by provider. RNCM scheduled patient follow up visit with primary care provider for 05/29/23 at 10:40 pm]  Education Interventions   Education Provided Provided Education, Provided Printed Education  [Discussed management for hyperglycemia/ hypoglycemia. Education article on diabetes management mailed to patient. Confirmed with son  patient has contiuous glucose monitor.]  Provided Verbal Education On Blood Sugar Monitoring, When to see the doctor  Nutrition Interventions   Nutrition Discussed/Reviewed Nutrition Reviewed  Pharmacy Interventions   Pharmacy Dicussed/Reviewed Pharmacy Topics Reviewed  [Attempted to review medications with son. Phone and written message sent  to pharmacist requesting call to patient/ son for medication management assistance.]              SDOH assessments and interventions completed:  No     Care Coordination Interventions:  Yes, provided   Follow up plan: Follow up call scheduled for 06/26/23    Encounter Outcome:  Patient Visit Completed   Danny Ina RN,BSN,CCM [facility] Care Coordination [phone] direct line

## 2023-05-23 NOTE — Patient Instructions (Signed)
Visit Information  Thank you for taking time to visit with me today. Please don't hesitate to contact me if I can be of assistance to you.   Following are the goals we discussed today:   Goals Addressed             This Visit's Progress    continued improvement post hospitalization/ management of health conditions and obtain community resource needs.       Interventions Today    Flowsheet Row Most Recent Value  Chronic Disease   Chronic disease during today's visit Diabetes  General Interventions   General Interventions Discussed/Reviewed General Interventions Reviewed, Doctor Visits, Labs  [evaluation of current treatment plan for diabetes and patients adherence to plan as established by provider.  Assessed for BS readings.]  Labs Hgb A1c every 3 months  Doctor Visits Discussed/Reviewed Doctor Visits Reviewed  Algis Downs son that patient needs to have follow up appointment with primary care provider post ED visit and as recommended by provider. RNCM scheduled patient follow up visit with primary care provider for 05/29/23 at 10:40 pm]  Education Interventions   Education Provided Provided Education, Provided Printed Education  [Discussed management for hyperglycemia/ hypoglycemia. Education article on diabetes management mailed to patient. Confirmed with son patient has contiuous glucose monitor.]  Provided Verbal Education On Blood Sugar Monitoring, When to see the doctor  Nutrition Interventions   Nutrition Discussed/Reviewed Nutrition Reviewed  Pharmacy Interventions   Pharmacy Dicussed/Reviewed Pharmacy Topics Reviewed  [Attempted to review medications with son. Phone and written message sent  to pharmacist requesting call to patient/ son for medication management assistance.]              Our next appointment is by telephone on 06/26/23 at 11 am  Please call the care guide team at [phone] if you need to cancel or reschedule your appointment.   If you are  experiencing a Mental Health or Behavioral Health Crisis or need someone to talk to, please call the Suicide and Crisis Lifeline: 988 call 1-800-273-TALK (toll free, 24 hour hotline)  Patient verbalizes understanding of instructions and care plan provided today and agrees to view in MyChart. Active MyChart status and patient understanding of how to access instructions and care plan via MyChart confirmed with patient.     George Ina RN,BSN,CCM [facility] Care Coordination [phone] direct line

## 2023-05-24 ENCOUNTER — Other Ambulatory Visit: Payer: Self-pay | Admitting: Pharmacist

## 2023-05-24 NOTE — Progress Notes (Unsigned)
05/27/2023 Name: Danny Pickert Pociask Sr. MRN: [id number] DOB: Nov 14, 1950  Subjective  Chief Complaint  Patient presents with   Diabetes   Reason for visit: Danny HUEBSCH Sr. is a 72 y.o. year old male who presented for a telephone visit.   They were referred to the pharmacist by their PCP for assistance in managing diabetes.   Care Team: Primary Care Provider: [name], MD  Reason for visit: ?  Danny BLOHM Sr. is a 72 y.o. male with a history of diabetes (type 2), who's son presents today for a follow up diabetes pharmacotherapy to review medications with son s/p recent ED visit for hyperglycemia.? Pertinent PMH also includes HTN, CAD, CHF, orthostasis, T2D with neuropathy and BKA 2/2 cellulitis, Hx ICH, CKD, HLD.   Known DM Complications: amputation, diabetic kidney disease, neuropathy   Date of Last Diabetes Related Encounter: ED visit 05/19/23 for "hyperglycemia". Patient called 911, reason unclear per son.   Medication Access/Adherence: Prescription drug coverage: Payor: [employer] MEDICARE / Plan: Suan Halter DUAL COMPLETE / Product Type: *No Product type* / .  Reports that all medications are affordable.  Current Patient Assistance: None Medication Adherence: Patient denies missing doses of their medication.  Son reports that patient uses adherence packaging so he assumes good adherence.   Medcation Fill Hx: GLP1RA Mounjaro 2.5 mg - 28 ds filled 06/08/22 Trulicity 4.5 mg - 84 ds filled 06/18/22 Mounjaro 5 mg - 84 ds filled 02/19/23 Mounjaro 5 mg - 84 ds filled 05/11/23  Since Last visit / History of Present Illness: ?  05/19/23: Patient reported to ED for hyperglycemia. Patient's son is unsure what caused his father to call 911 (eg seeing a high BG number, or symptoms).  Upon son's review of medications in patient's possession, appears he was taking [place] and Mounjaro. Son has confirmed that patient does not have insulin or Trulicity at home anymore.  He does note that patient can be hyper-focused on his sugar levels (previously causing some concern with sliding scale insulin, as he reports patient would check BG frequently and try to correct). Reports patient still has his sliding scale insulin chart on the fridge, though has confirmed no insulin present in his home currently.  Reported DM Regimen: ?  Jardiance 25 mg daily Mounjaro 5 mg sq once weekly  SMBG  Freestyle 2 : Patient is not present today during phone check-in with his son. Son is unaware of what patient's sugars tend to run   Hypo/Hyperglycemia: ?  Symptoms of hypoglycemia since last visit:? no  If yes, it was treated by: n/a  Symptoms of hyperglycemia since last visit:? n/a -  Son is unaware  Reported Diet: Unable to assess  Exercise: No. Amputee, son reports patient remains in his wheelchair for all ambulation.   DM Prevention:  Statin: Taking; high intensity.?  History of chronic kidney disease? yes History of albuminuria?  Unclear, lack of data , last UACR in 2018 = undetectable ACE/ARB - Not taking; Urine MA/CR Ratio -  Overdue .  Last eye exam: 11/04/20; No retinopathy present - DUE Last foot exam: 02/25/2023 Tobacco Use: Former smoker Immunizations:? Flu: Due (Last: 04/27/2022); Pneumococcal: PPSV23 (08/02/2019); Shingrix: No Record - DUE (Last: , ); Covid (04/2020, 05/2020; No record of Booster)  Cardiovascular Risk Reduction History of clinical ASCVD? yes The ASCVD Risk score (Arnett DK, et al., 2019) failed to calculate for the following reasons:   The patient has a prior MI or stroke diagnosis History of heart  failure? yes  History of hyperlipidemia? yes Current BMI: 32.5 kg/m2 (Ht 72 in, Wt 108.9 kg) Taking statin? yes; high intensity (atorvastatin 80 mg) Taking aspirin? indicated (secondary prevention); Taking   Taking SGLT-2i? yes Taking GLP- 1 RA? yes   Reported HTN Regimen: ?  Amlodipine 10 mg daily Carvedilol 25 mg twice daily Spironolactone 25  mg daily   HTN medications tried in the past:?  lisinopril (transitioned to [place]) Entresto held in the setting of AKI, has not restarted    _______________________________________________  Objective    Review of Systems:?  Unable to assess, patient not present for virtual phone check-in with son.    Physical Examination:  Vitals:  Wt Readings from Last 3 Encounters:  04/24/23 240 lb (108.9 kg)  04/19/23 235 lb (106.6 kg)  02/18/23 235 lb (106.6 kg)   BP Readings from Last 3 Encounters:  05/20/23 135/88  04/19/23 100/64  02/26/23 (!) 144/88   Pulse Readings from Last 3 Encounters:  05/20/23 72  04/19/23 72  02/26/23 78     Labs:?  Lab Results  Component Value Date   HGBA1C 10.1 (H) 02/25/2023   HGBA1C 9.6 (H) 09/12/2022   HGBA1C 7.4 (H) 04/27/2022   GLUCOSE 369 (H) 05/19/2023   CREATININE 1.43 (H) 05/19/2023   CREATININE 1.10 03/01/2023   CREATININE 1.53 (H) 02/25/2023   GFR 57.15 (L) 02/25/2023   GFR 58.97 (L) 10/18/2022   GFR 54.22 (L) 09/12/2022    Lab Results  Component Value Date   CHOL 136 09/12/2022   LDLCALC 59 09/12/2022   LDLCALC 39 10/03/2021   LDLCALC 38 10/26/2020   LDLDIRECT 64.0 03/20/2018   HDL 55.70 09/12/2022   HDL 44 10/03/2021   HDL 46 10/26/2020   AST 17 02/25/2023   AST 18 12/21/2022   ALT 25 02/25/2023   ALT 28 12/21/2022      Chemistry      Component Value Date/Time   NA 137 05/19/2023 1750   NA 141 03/01/2023 1555   NA 135 11/23/2014 2006   K 4.2 05/19/2023 1750   K 4.0 11/23/2014 2006   CL 102 05/19/2023 1750   CL 100 (L) 11/23/2014 2006   CO2 24 05/19/2023 1750   CO2 24 11/23/2014 2006   BUN 37 (H) 05/19/2023 1750   BUN 29 (H) 03/01/2023 1555   BUN 40 (H) 11/23/2014 2006   CREATININE 1.43 (H) 05/19/2023 1750   CREATININE 2.16 (H) 11/23/2014 2006      Component Value Date/Time   CALCIUM 9.0 05/19/2023 1750   CALCIUM 8.6 (L) 11/23/2014 2006   ALKPHOS 97 02/25/2023 2007   ALKPHOS 96 11/23/2014 2006   AST 17  02/25/2023 2007   AST 18 11/23/2014 2006   ALT 25 02/25/2023 2007   ALT 14 (L) 11/23/2014 2006   BILITOT 0.5 02/25/2023 2007   BILITOT 0.6 11/23/2014 2006       The ASCVD Risk score (Arnett DK, et al., 2019) failed to calculate for the following reasons:   The patient has a prior MI or stroke diagnosis  Assessment and Plan:   1. Diabetes, type 2: uncontrolled per last A1c of 10.1% (02/25/23), increased from previous 9.6% (09/12/22) with a reasonable goal being <7-8% without hypoglycemia per age, comorbidities, patient safety (living alone). Recent ED visit for hyperglycemia, though son is unsure what caused patient to call 911 (eg unclear if because of sugar reading >300, or if also having symptoms). At ED, BUN 37, BG mildly elevated ~300 mg/dL w/o  c/f ketoacidosis. Patient primarily drinking diet soda (in excess). SMBG unclear - patient not present at encounter. Current Regimen: Mounjaro 5 mg weekly, Jardiance 25 mg daily Continue medications today without changes.  Diet: Son reports excessive intake of Diet Coke which has mixed data suggesting harm in kidneys. [employer] has warned against >1 Diet soda/day in CKD due to association with more rapid decline in eGFR. Encouraged water intake (in moderation), especially in the setting of dehydration/repeated AKIs. Limit diet soda intake. Advised to bring [facility] reader and medication adherence packaging to upcoming PCP visit.  Future Consideration: Metformin: Not appropriate in the setting of advanced CKD SU: Not ideal in poor renal function TZD: Avoid w CHF diagnosis.  Insulin: Not contraindicated, though significant concerns present based on patient's functional status, living alone, tendency to repeatedly check BG and correct with SSI. Titration of GLP1 RA preferred w lifestyle modification due to safety.   Due to receive the following vaccines: Influenza, Shingrix, PCV20, and Covid Booster   Follow Up Follow up scheduled  sooner with PCP; 05/29/23 Follow up with Pharmacist as needed pending PCP plan   Future Appointments  Date Time Provider Department Center  05/29/2023 10:40 AM [name], MD [facility]  06/11/2023 10:45 AM [facility] None  06/11/2023 11:15 AM [facility] None  06/11/2023 11:45 AM Dew, Marlow Baars, MD [facility] None  06/26/2023 11:00 AM Otho Ket, RN [facility] None  04/29/2024  2:30 PM [facility] ANNUAL WELLNESS VISIT [facility]   Loree Fee, PharmD Clinical Pharmacist [facility] [phone]

## 2023-05-25 DIAGNOSIS — R531 Weakness: Secondary | ICD-10-CM | POA: Diagnosis not present

## 2023-05-29 ENCOUNTER — Ambulatory Visit: Payer: 59 | Admitting: Family Medicine

## 2023-05-30 ENCOUNTER — Other Ambulatory Visit: Payer: Self-pay

## 2023-05-30 DIAGNOSIS — I5042 Chronic combined systolic (congestive) and diastolic (congestive) heart failure: Secondary | ICD-10-CM | POA: Insufficient documentation

## 2023-05-30 DIAGNOSIS — J449 Chronic obstructive pulmonary disease, unspecified: Secondary | ICD-10-CM | POA: Insufficient documentation

## 2023-05-30 DIAGNOSIS — I2510 Atherosclerotic heart disease of native coronary artery without angina pectoris: Secondary | ICD-10-CM | POA: Insufficient documentation

## 2023-05-30 DIAGNOSIS — E1165 Type 2 diabetes mellitus with hyperglycemia: Secondary | ICD-10-CM | POA: Diagnosis not present

## 2023-05-30 DIAGNOSIS — Z7902 Long term (current) use of antithrombotics/antiplatelets: Secondary | ICD-10-CM | POA: Diagnosis not present

## 2023-05-30 DIAGNOSIS — R0902 Hypoxemia: Secondary | ICD-10-CM | POA: Diagnosis not present

## 2023-05-30 DIAGNOSIS — Z79899 Other long term (current) drug therapy: Secondary | ICD-10-CM | POA: Diagnosis not present

## 2023-05-30 DIAGNOSIS — Z7982 Long term (current) use of aspirin: Secondary | ICD-10-CM | POA: Diagnosis not present

## 2023-05-30 DIAGNOSIS — N183 Chronic kidney disease, stage 3 unspecified: Secondary | ICD-10-CM | POA: Diagnosis not present

## 2023-05-30 DIAGNOSIS — Z743 Need for continuous supervision: Secondary | ICD-10-CM | POA: Diagnosis not present

## 2023-05-30 DIAGNOSIS — I130 Hypertensive heart and chronic kidney disease with heart failure and stage 1 through stage 4 chronic kidney disease, or unspecified chronic kidney disease: Secondary | ICD-10-CM | POA: Insufficient documentation

## 2023-05-30 DIAGNOSIS — R739 Hyperglycemia, unspecified: Secondary | ICD-10-CM | POA: Diagnosis not present

## 2023-05-30 LAB — COMPREHENSIVE METABOLIC PANEL
ALT: 31 U/L (ref 0–44)
AST: 18 U/L (ref 15–41)
Albumin: 4.1 g/dL (ref 3.5–5.0)
Alkaline Phosphatase: 88 U/L (ref 38–126)
Anion gap: 8 (ref 5–15)
BUN: 36 mg/dL — ABNORMAL HIGH (ref 8–23)
CO2: 26 mmol/L (ref 22–32)
Calcium: 9 mg/dL (ref 8.9–10.3)
Chloride: 103 mmol/L (ref 98–111)
Creatinine, Ser: 1.05 mg/dL (ref 0.61–1.24)
GFR, Estimated: >60 mL/min (ref >60)
Glucose, Bld: 398 mg/dL — ABNORMAL HIGH (ref 70–99)
Potassium: 4.1 mmol/L (ref 3.5–5.1)
Sodium: 137 mmol/L (ref 135–145)
Total Bilirubin: 0.5 mg/dL (ref 0.3–1.2)
Total Protein: 7 g/dL (ref 6.5–8.1)

## 2023-05-30 LAB — URINALYSIS, ROUTINE W REFLEX MICROSCOPIC
Bacteria, UA: NONE SEEN
Bilirubin Urine: NEGATIVE
Glucose, UA: >=500 mg/dL — AB
Hgb urine dipstick: NEGATIVE
Ketones, ur: NEGATIVE mg/dL
Leukocytes,Ua: NEGATIVE
Nitrite: NEGATIVE
Protein, ur: NEGATIVE mg/dL
Specific Gravity, Urine: 1.026 (ref 1.005–1.030)
pH: 5 (ref 5.0–8.0)

## 2023-05-30 LAB — CBC
HCT: 48.1 % (ref 39.0–52.0)
Hemoglobin: 15.5 g/dL (ref 13.0–17.0)
MCH: 31.3 pg (ref 26.0–34.0)
MCHC: 32.2 g/dL (ref 30.0–36.0)
MCV: 97.2 fL (ref 80.0–100.0)
Platelets: 222 10*3/uL (ref 150–400)
RBC: 4.95 MIL/uL (ref 4.22–5.81)
RDW: 12.9 % (ref 11.5–15.5)
WBC: 7.7 10*3/uL (ref 4.0–10.5)
nRBC: 0 % (ref 0.0–0.2)

## 2023-05-30 LAB — CBG MONITORING, ED: Glucose-Capillary: 376 mg/dL — ABNORMAL HIGH (ref 70–99)

## 2023-05-30 NOTE — ED Triage Notes (Signed)
Pt reports "I feel like I'm in another world" and high blood sugar at home. Unsure how high. Pt oriented to place, time, self, situation. Pt does appear to be in a daze. Denies pain. Breathing unlabored. Pt reports bilateral blurred vision. Pt reports hx of same.

## 2023-05-30 NOTE — ED Notes (Signed)
Rainbow collected and in lab

## 2023-05-31 ENCOUNTER — Emergency Department
Admission: EM | Admit: 2023-05-31 | Discharge: 2023-05-31 | Disposition: A | Discharge status: Home / Self Care | Payer: 59 | Attending: Emergency Medicine | Admitting: Emergency Medicine

## 2023-05-31 DIAGNOSIS — Z7401 Bed confinement status: Secondary | ICD-10-CM | POA: Diagnosis not present

## 2023-05-31 DIAGNOSIS — R739 Hyperglycemia, unspecified: Secondary | ICD-10-CM

## 2023-05-31 DIAGNOSIS — R29898 Other symptoms and signs involving the musculoskeletal system: Secondary | ICD-10-CM | POA: Diagnosis not present

## 2023-05-31 LAB — CBG MONITORING, ED: Glucose-Capillary: 185 mg/dL — ABNORMAL HIGH (ref 70–99)

## 2023-05-31 NOTE — ED Provider Notes (Signed)
[facility] Provider Note    Event Date/Time   First MD Initiated Contact with Patient 05/31/23 [phone]     (approximate)   History   Hyperglycemia   HPI  Danny BRETL Sr. is a 72 y.o. male  brought to the ED for hyperglycemia. Endorses blurry vision and dry mouth. Similar symptoms previously with elevated BS.  States his doctor took him off "the needle" but he thinks he needs to be back on.  Denies fever/chills, chest pain, shortness of breath, abdominal pain, nausea, vomiting or dizziness.      Past Medical History   Past Medical History:  Diagnosis Date   CAD (coronary artery disease)    a. 01/2010 PCI of LAD; b. 09/2014 PCI/DES of mLAD due to ISR. D1 80, D2 80(jailed), LCX 66m; c. 07/2016 NSTEMI/Cath: LM nl, LAD 20m ISR, 40d, D1 90ost, 80p, D2 90ost, RI min irregs, LCX min irregs, OM1 90 small, OM2/3 min irregs, RCA min irregs, RPLB1 90, EF 25-35%.   Chronic combined systolic and diastolic CHF (congestive heart failure) (HCC)    a. 07/2016 Echo: EF 30%, severe septal/anterior HK, Gr1 DD.   CKD (chronic kidney disease), stage III (HCC)    COPD (chronic obstructive pulmonary disease) (HCC)    DM2 (diabetes mellitus, type 2) (HCC)    Erectile dysfunction    HLD (hyperlipidemia)    HTN (hypertension)    Ischemic cardiomyopathy    a. 07/2016 Echo: EF 30% w/ sev septal/ant HK. Gr1 DD.     Active Problem List   Patient Active Problem List   Diagnosis Date Noted   Swelling of limb 04/19/2023   Erythrocytosis 12/20/2022   Unable to care for self 09/23/2022   Non-traumatic rhabdomyolysis 09/23/2022   Candidal intertrigo 05/11/2022   ICH (intracerebral hemorrhage) (HCC) 09/30/2021   Hx of BKA, left (HCC) 12/16/2020   Chronic systolic heart failure (HCC)    Cellulitis of left foot 11/09/2020   HFrEF (heart failure with reduced ejection fraction) (HCC)    PVD (peripheral vascular disease) (HCC)    Low back pain 06/29/2020   Onychomycosis 03/29/2020    Fall 03/29/2020   Coronary artery disease of native artery of native heart with stable angina pectoris (HCC) 12/12/2018   Pulmonary emphysema (HCC) 12/12/2018   Pure hypercholesterolemia 12/12/2018   Atherosclerosis of native arteries of extremity with intermittent claudication (HCC) 02/11/2018   Decreased pedal pulses 07/02/2017   Orthostasis 05/07/2017   Anxiety and depression 05/07/2017   CKD (chronic kidney disease) stage 3, GFR 30-59 ml/min (HCC) 03/06/2017   Diabetic neuropathy (HCC) 03/06/2017   Chronic diastolic CHF (congestive heart failure) (HCC) 03/05/2017   Morbid obesity (HCC) 06/18/2016   H/O medication noncompliance 06/18/2016   GERD (gastroesophageal reflux disease) 12/20/2014   Type 2 diabetes mellitus with hyperlipidemia (HCC) 06/05/2010   Hyperlipidemia 02/10/2010   Essential hypertension 02/10/2010   CAD (coronary artery disease) 02/10/2010     Past Surgical History   Past Surgical History:  Procedure Laterality Date   AMPUTATION Left 11/10/2020   Procedure: AMPUTATION BELOW KNEE;  Surgeon: Annice Needy, MD;  Location: [facility] ORS;  Service: General;  Laterality: Left;   BELOW KNEE LEG AMPUTATION     CAD: stent to the LAD     CARDIAC CATHETERIZATION  10/01/2014   CARDIAC CATHETERIZATION N/A 07/23/2016   Procedure: Left Heart Cath and Coronary Angiography;  Surgeon: Iran Ouch, MD;  Location: [facility] INVASIVE CV LAB;  Service: Cardiovascular;  Laterality: N/A;  CORONARY ANGIOPLASTY WITH STENT PLACEMENT  10/01/2014   LOWER EXTREMITY ANGIOGRAPHY Left 10/31/2020   Procedure: LOWER EXTREMITY ANGIOGRAPHY;  Surgeon: Annice Needy, MD;  Location: [facility] INVASIVE CV LAB;  Service: Cardiovascular;  Laterality: Left;   LOWER EXTREMITY ANGIOGRAPHY Right 11/20/2021   Procedure: Lower Extremity Angiography;  Surgeon: Annice Needy, MD;  Location: [facility] INVASIVE CV LAB;  Service: Cardiovascular;  Laterality: Right;     Home Medications   Prior to Admission medications    Medication Sig Start Date End Date Taking? Authorizing Provider  acetaminophen (TYLENOL) 325 MG tablet Take 2 tablets (650 mg total) by mouth every 4 (four) hours as needed for mild pain (or temp > 37.5 C (99.5 F)). 10/10/21   Dorcas Carrow, MD  albuterol (PROVENTIL) (2.5 MG/3ML) 0.083% nebulizer solution Take 3 mLs (2.5 mg total) by nebulization every 6 (six) hours as needed for wheezing or shortness of breath. 09/30/20   [name], MD  albuterol (VENTOLIN HFA) 108 (90 Base) MCG/ACT inhaler Inhale 2 puffs into the lungs every 6 (six) hours as needed for wheezing or shortness of breath. 02/18/23   Delton Prairie, MD  Albuterol Sulfate (PROAIR RESPICLICK) 108 (90 Base) MCG/ACT AEPB Inhale 1 puff into the lungs every 6 (six) hours as needed. 11/19/22   [name], MD  amLODipine (NORVASC) 10 MG tablet Take 1 tablet (10 mg total) by mouth daily. 02/21/23   [name], MD  aspirin EC 81 MG tablet Take 1 tablet (81 mg total) by mouth daily. 02/25/23   [name], MD  atorvastatin (LIPITOR) 80 MG tablet Take 1 tablet (80 mg total) by mouth daily. 02/21/23   [name], MD  carvedilol (COREG) 25 MG tablet Take 1 tablet (25 mg total) by mouth 2 (two) times daily with a meal. 02/21/23   [name], MD  clopidogrel (PLAVIX) 75 MG tablet TAKE 1 TABLET BY MOUTH ONCE DAILY 05/16/23   Annice Needy, MD  Continuous Glucose Receiver (FREESTYLE LIBRE 2 READER) DEVI 1 Device by Does not apply route 4 (four) times daily. 05/14/23   [name], MD  Continuous Glucose Sensor (FREESTYLE LIBRE 2 SENSOR) MISC Check blood sugars 03/25/23   [name], MD  Dulaglutide (TRULICITY) 4.5 MG/0.5ML SOPN Inject 4.5 mg into the skin once a week. 11/19/22   [name], MD  empagliflozin (JARDIANCE) 25 MG TABS tablet Take 1 tablet (25 mg total) by mouth daily. 02/21/23   [name], MD  escitalopram (LEXAPRO) 10 MG tablet Take 1 tablet (10 mg total) by mouth daily.  02/21/23   [name], MD  MOUNJARO 5 MG/0.5ML Pen Inject 5 mg into the skin once a week. 02/19/23   [provider]  nitroGLYCERIN (NITROSTAT) 0.4 MG SL tablet Place 0.4 mg under the tongue every 5 (five) minutes as needed for chest pain.    [provider]  spironolactone (ALDACTONE) 25 MG tablet TAKE 1 TABLET (25 MG TOTAL) BY MOUTH DAILY. 02/21/23   [name], MD  tamsulosin (FLOMAX) 0.4 MG CAPS capsule Take 1 capsule (0.4 mg total) by mouth daily. 02/21/23   [name], MD  umeclidinium-vilanterol (ANORO ELLIPTA) 62.5-25 MCG/ACT AEPB Inhale 1 puff into the lungs daily. 02/25/23   [name], MD     Allergies  Metformin and related   Family History   Family History  Problem Relation Age of Onset   Heart attack Father  complications   Cancer Brother      Physical Exam  Triage Vital Signs: ED Triage Vitals  Encounter Vitals Group     BP 05/30/23 2253 (!) 143/76     Systolic BP Percentile --      Diastolic BP Percentile --      Pulse Rate 05/30/23 2253 78     Resp 05/30/23 2253 20     Temp 05/30/23 2253 97.6 F (36.4 C)     Temp Source 05/30/23 2253 Oral     SpO2 05/30/23 2253 94 %     Weight 05/30/23 2254 247 lb (112 kg)     Height 05/30/23 2254 6' (1.829 m)     Head Circumference --      Peak Flow --      Pain Score 05/30/23 2254 0     Pain Loc --      Pain Education --      Exclude from Growth Chart --     Updated Vital Signs: BP (!) 143/76 (BP Location: Left Arm)   Pulse 78   Temp 97.6 F (36.4 C) (Oral)   Resp 20   Ht 6' (1.829 m)   Wt 112 kg   SpO2 94%   BMI 33.50 kg/m    General: Awake, no distress.  CV:  RRR.  Good peripheral perfusion.  Resp:  Normal effort.  CTAB. Abd:  Nontender.  No distention.  Other:  Left BKA.  Alert and oriented x 3.  CN II-XII grossly intact.  5/5 motor strength and sensation BUE and RLE.   ED Results / Procedures / Treatments  Labs (all labs ordered are listed,  but only abnormal results are displayed) Labs Reviewed  URINALYSIS, ROUTINE W REFLEX MICROSCOPIC - Abnormal; Notable for the following components:      Result Value   Color, Urine YELLOW (*)    APPearance CLEAR (*)    Glucose, UA >=500 (*)    All other components within normal limits  COMPREHENSIVE METABOLIC PANEL - Abnormal; Notable for the following components:   Glucose, Bld 398 (*)    BUN 36 (*)    All other components within normal limits  CBG MONITORING, ED - Abnormal; Notable for the following components:   Glucose-Capillary 376 (*)    All other components within normal limits  CBG MONITORING, ED - Abnormal; Notable for the following components:   Glucose-Capillary 185 (*)    All other components within normal limits  CBC  CBG MONITORING, ED  CBG MONITORING, ED     EKG  None   RADIOLOGY None   Official radiology report(s): No results found.   PROCEDURES:  Critical Care performed: No  Procedures   MEDICATIONS ORDERED IN ED: Medications - No data to display   IMPRESSION / MDM / ASSESSMENT AND PLAN / ED COURSE  I reviewed the triage vital signs and the nursing notes.                             72 year old male presenting with blurry vision, hyperglycemia.  Differential diagnosis includes but is not limited to infectious, metabolic, endocrinologic etiologies, etc.  I have personally reviewed patient's records and note and ED visit for hyperglycemia on 05/19/2023.  Patient's presentation is most consistent with acute presentation with potential threat to life or bodily function.  Laboratory results demonstrate normal WBC 7.7, hyperglycemia without elevation of anion gap.  UA negative.  Will check current  blood sugar and treat appropriately.  Will reassess.  Clinical Course as of 05/31/23 0436  Fri May 31, 2023  4098 Repeat blood sugar 185.  No treatment needed at this time.  Strict return precautions given.  Patient verbalizes understanding and agrees  with plan of care. [JS]    Clinical Course User Index [JS] Irean Hong, MD     FINAL CLINICAL IMPRESSION(S) / ED DIAGNOSES   Final diagnoses:  Hyperglycemia     Rx / DC Orders   ED Discharge Orders     None        Note:  This document was prepared using Dragon voice recognition software and may include unintentional dictation errors.   Irean Hong, MD 05/31/23 [id number]

## 2023-05-31 NOTE — Discharge Instructions (Signed)
Take your medicines as directed by your doctor.  Return to the ER for worsening symptoms, persistent vomiting, difficulty breathing or other concerns.

## 2023-05-31 NOTE — ED Notes (Signed)
Called [facility] to request transport back home waiting on [facility]

## 2023-06-05 ENCOUNTER — Encounter: Payer: Self-pay | Admitting: Family Medicine

## 2023-06-05 ENCOUNTER — Ambulatory Visit: Payer: 59 | Admitting: Family Medicine

## 2023-06-05 VITALS — BP 136/70 | HR 80 | Temp 97.7°F | Ht 72.0 in

## 2023-06-05 DIAGNOSIS — E1169 Type 2 diabetes mellitus with other specified complication: Secondary | ICD-10-CM | POA: Diagnosis not present

## 2023-06-05 DIAGNOSIS — Z7985 Long-term (current) use of injectable non-insulin antidiabetic drugs: Secondary | ICD-10-CM | POA: Diagnosis not present

## 2023-06-05 DIAGNOSIS — E785 Hyperlipidemia, unspecified: Secondary | ICD-10-CM | POA: Diagnosis not present

## 2023-06-05 DIAGNOSIS — Z7984 Long term (current) use of oral hypoglycemic drugs: Secondary | ICD-10-CM

## 2023-06-05 DIAGNOSIS — Z23 Encounter for immunization: Secondary | ICD-10-CM

## 2023-06-05 LAB — POCT GLYCOSYLATED HEMOGLOBIN (HGB A1C)
HbA1c POC (<> result, manual entry): 9.1 % (ref 4.0–5.6)
HbA1c, POC (controlled diabetic range): 9.1 % — AB (ref 0.0–7.0)
HbA1c, POC (prediabetic range): 9.1 % — AB (ref 5.7–6.4)
Hemoglobin A1C: 9.1 % — AB (ref 4.0–5.6)

## 2023-06-05 MED ORDER — TIRZEPATIDE 7.5 MG/0.5ML ~~LOC~~ SOAJ: 7.5000 mg | SUBCUTANEOUS | 0 refills | Status: DC

## 2023-06-05 NOTE — Progress Notes (Signed)
Marikay Alar, MD Phone: [phone]  Danny Friends Dunstan Sr. is a 72 y.o. male who presents today for f/u.  DIABETES Disease Monitoring: Blood Sugar ranges-notes he ran out of sensors so has not been checking recently though notes at some point in the last several weeks his sugar was down into the 100s though in the ED it was up into the 300s. Polyuria/phagia/dipsia-has probably urea and polydipsia.      Optho-up-to-date Medications: Compliance-taking Mounjaro 5 mg weekly and Jardiance hypoglycemic symptoms-no   Social History   Tobacco Use  Smoking Status Former   Current packs/day: 0.00   Types: Cigarettes   Start date: 09/07/2009   Quit date: 09/07/2009   Years since quitting: 13.7  Smokeless Tobacco Never  Tobacco Comments   quit june 2011    Current Outpatient Medications on File Prior to Visit  Medication Sig Dispense Refill   acetaminophen (TYLENOL) 325 MG tablet Take 2 tablets (650 mg total) by mouth every 4 (four) hours as needed for mild pain (or temp > 37.5 C (99.5 F)).     albuterol (PROVENTIL) (2.5 MG/3ML) 0.083% nebulizer solution Take 3 mLs (2.5 mg total) by nebulization every 6 (six) hours as needed for wheezing or shortness of breath. 150 mL 1   albuterol (VENTOLIN HFA) 108 (90 Base) MCG/ACT inhaler Inhale 2 puffs into the lungs every 6 (six) hours as needed for wheezing or shortness of breath. 8 g 2   Albuterol Sulfate (PROAIR RESPICLICK) 108 (90 Base) MCG/ACT AEPB Inhale 1 puff into the lungs every 6 (six) hours as needed. 1 each 2   amLODipine (NORVASC) 10 MG tablet Take 1 tablet (10 mg total) by mouth daily. 90 tablet 1   aspirin EC 81 MG tablet Take 1 tablet (81 mg total) by mouth daily. 90 tablet 3   atorvastatin (LIPITOR) 80 MG tablet Take 1 tablet (80 mg total) by mouth daily. 90 tablet 1   carvedilol (COREG) 25 MG tablet Take 1 tablet (25 mg total) by mouth 2 (two) times daily with a meal. 180 tablet 1   clopidogrel (PLAVIX) 75 MG tablet TAKE 1 TABLET BY  MOUTH ONCE DAILY 30 tablet 5   Continuous Glucose Receiver (FREESTYLE LIBRE 2 READER) DEVI 1 Device by Does not apply route 4 (four) times daily. 1 each 0   Continuous Glucose Sensor (FREESTYLE LIBRE 2 SENSOR) MISC Check blood sugars 6 each 3   empagliflozin (JARDIANCE) 25 MG TABS tablet Take 1 tablet (25 mg total) by mouth daily. 90 tablet 1   escitalopram (LEXAPRO) 10 MG tablet Take 1 tablet (10 mg total) by mouth daily. 90 tablet 1   nitroGLYCERIN (NITROSTAT) 0.4 MG SL tablet Place 0.4 mg under the tongue every 5 (five) minutes as needed for chest pain.     spironolactone (ALDACTONE) 25 MG tablet TAKE 1 TABLET (25 MG TOTAL) BY MOUTH DAILY. 90 tablet 1   tamsulosin (FLOMAX) 0.4 MG CAPS capsule Take 1 capsule (0.4 mg total) by mouth daily. 90 capsule 1   umeclidinium-vilanterol (ANORO ELLIPTA) 62.5-25 MCG/ACT AEPB Inhale 1 puff into the lungs daily. 3 each 11   No current facility-administered medications on file prior to visit.     ROS see history of present illness  Objective  Physical Exam Vitals:   06/05/23 1152 06/05/23 1154  BP: 126/80 136/70  Pulse: 80   Temp: 97.7 F (36.5 C)   SpO2: 96%     BP Readings from Last 3 Encounters:  06/05/23 136/70  05/31/23 [name])  160/90  05/20/23 135/88   Wt Readings from Last 3 Encounters:  05/30/23 247 lb (112 kg)  04/24/23 240 lb (108.9 kg)  04/19/23 235 lb (106.6 kg)    Physical Exam Constitutional:      General: He is not in acute distress.    Appearance: He is not diaphoretic.  Cardiovascular:     Rate and Rhythm: Normal rate and regular rhythm.     Heart sounds: Normal heart sounds.  Pulmonary:     Effort: Pulmonary effort is normal.     Breath sounds: Normal breath sounds.  Skin:    General: Skin is warm and dry.  Neurological:     Mental Status: He is alert.      Assessment/Plan: Please see individual problem list.  Type 2 diabetes mellitus with hyperlipidemia (HCC) Assessment & Plan: Chronic issue.   Uncontrolled.  Check A1c.  Will increase Mounjaro to 7.5 mg weekly.  Discussed after 4 weeks we could increase the Mounjaro to 10 mg weekly.  He will continue Jardiance 25 mg daily.  Patient wondered about going back on as needed insulin and discussed that it would be best to get his sugar down in a consistent manner with the [facility].  There is some risk of hypoglycemia for him with insulin given that he lives alone and his tendency to check sugar frequently and correct with sliding scale insulin.  Patient will follow-up with me in 3 weeks.  Orders: -     POCT glycosylated hemoglobin (Hb A1C) -     Tirzepatide; Inject 7.5 mg into the skin once a week.  Dispense: 2 mL; Refill: 0  Need for pneumococcal 20-valent conjugate vaccination -     Pneumococcal conjugate vaccine 20-valent     Return in about 3 weeks (around 06/26/2023) for Diabetes.   Marikay Alar, MD [facility]

## 2023-06-05 NOTE — Patient Instructions (Addendum)
Nice to see you. We are going to increase your Mounjaro to 7.5 mg weekly.

## 2023-06-05 NOTE — Assessment & Plan Note (Addendum)
Chronic issue.  Uncontrolled.  Check A1c.  Will increase Mounjaro to 7.5 mg weekly.  Discussed after 4 weeks we could increase the Mounjaro to 10 mg weekly.  He will continue Jardiance 25 mg daily.  Patient wondered about going back on as needed insulin and discussed that it would be best to get his sugar down in a consistent manner with the [facility].  There is some risk of hypoglycemia for him with insulin given that he lives alone and his tendency to check sugar frequently and correct with sliding scale insulin.  Patient will follow-up with me in 3 weeks.

## 2023-06-11 ENCOUNTER — Encounter (INDEPENDENT_AMBULATORY_CARE_PROVIDER_SITE_OTHER): Payer: 59

## 2023-06-11 ENCOUNTER — Ambulatory Visit (INDEPENDENT_AMBULATORY_CARE_PROVIDER_SITE_OTHER): Payer: 59 | Admitting: Vascular Surgery

## 2023-06-21 ENCOUNTER — Other Ambulatory Visit: Payer: Self-pay | Admitting: Family Medicine

## 2023-06-23 DIAGNOSIS — Z9181 History of falling: Secondary | ICD-10-CM | POA: Diagnosis not present

## 2023-06-23 DIAGNOSIS — J449 Chronic obstructive pulmonary disease, unspecified: Secondary | ICD-10-CM | POA: Diagnosis not present

## 2023-06-23 DIAGNOSIS — Z89512 Acquired absence of left leg below knee: Secondary | ICD-10-CM | POA: Diagnosis not present

## 2023-06-25 DIAGNOSIS — R531 Weakness: Secondary | ICD-10-CM | POA: Diagnosis not present

## 2023-06-26 ENCOUNTER — Ambulatory Visit: Payer: Self-pay

## 2023-06-26 NOTE — Patient Outreach (Signed)
  Care Coordination   Follow Up Visit Note   06/26/2023 Name: Yordy Macaskill Stachnik Sr. MRN: [id number] DOB: 09-12-1950  Gerrit Friends Brundage Sr. is a 72 y.o. year old male who sees [name], Yehuda Mao, MD for primary care. I  spoke with son/ designated party release, Jonette Mate.   What matters to the patients health and wellness today?  Son states patient received his new wheelchair.  Son states patient complains of right leg pain.  He states patient's right foot doesn't look good.  He states patient missed his vascular appointment scheduled on 06/11/23 and request RNCM assist with rescheduling.  Son states patient has a nephew that helps him keep up with patients doctor appointments.   Son states patient was not able to afford the recommended compression hose therefore has not been wearing any.   Son reports patient has a new primary care provider that he is scheduled to see in January 2025.  Son states he is unsure of the providers name and location of appointment.   Son states patient doesn't monitor blood sugars.    Goals Addressed             This Visit's Progress    continued improvement post hospitalization/ management of health conditions and obtain community resource needs.       Interventions Today    Flowsheet Row Most Recent Value  Chronic Disease   Chronic disease during today's visit Diabetes  General Interventions   General Interventions Discussed/Reviewed General Interventions Reviewed, Doctor Visits, Vaccines  [evaluation of current treatment plan for diabetes and patients adherehce to plan as established by provider.  Assessed for new/ or ongoing symptoms.]  Vaccines Pneumonia  Doctor Visits Discussed/Reviewed Doctor Visits Reviewed  Algis Downs to keep scheduled appointments with providers. Call to vascular provider, Dr. Wyn Quaker to reschedule patients missed vascular testing and provider follow up appointment.  Provided name and address to new primary provider appointment.]  Education  Interventions   Education Provided Provided Education  [Advised importance of keeping up with scheduled provider appointments. Advised to use paper calendar, phone calendar and/ or Mychart to remember scheduled provider appointments.  Reviewed signs / symptoms of infection and when to notify provider.]  Provided Verbal Education On Blood Sugar Monitoring, Other, Eye Care  [Appointment date for vascular ultrasound given to caregiver/son for 07/01/23 at 2 pm.  Appointment date for follow up appointment with Dr. Wyn Quaker given to son for 07/02/23 at 10:45 am. Advised to let provider know he was unable to obtain compression hose.]  Mental Health Interventions   Mental Health Discussed/Reviewed Other  [Son advised to consider going in with patient to provider visit to help patient understand the plan of care.]  Pharmacy Interventions   Pharmacy Dicussed/Reviewed Pharmacy Topics Reviewed  Algis Downs son to pick up patients mounjaro prescription from pharmacy.  Advised to make sure previous doses of mounjaro are removed / disposed of  to not confuse patient.]  Safety Interventions   Safety Discussed/Reviewed Fall Risk  [Confirmed patient received new wheelchair.]              SDOH assessments and interventions completed:  No     Care Coordination Interventions:  Yes, provided   Follow up plan: Follow up call scheduled for 07/25/23    Encounter Outcome:  Patient Visit Completed   George Ina RN,BSN,CCM [facility]  [facility], [facility] coordinator / Case Manager Phone: [phone]

## 2023-06-26 NOTE — Patient Instructions (Signed)
Visit Information  Thank you for taking time to visit with me today. Please don't hesitate to contact me if I can be of assistance to you.   Following are the goals we discussed today:   Goals Addressed             This Visit's Progress    continued improvement post hospitalization/ management of health conditions and obtain community resource needs.       Interventions Today    Flowsheet Row Most Recent Value  Chronic Disease   Chronic disease during today's visit Diabetes  General Interventions   General Interventions Discussed/Reviewed General Interventions Reviewed, Doctor Visits, Vaccines  [evaluation of current treatment plan for diabetes and patients adherehce to plan as established by provider.  Assessed for new/ or ongoing symptoms.]  Vaccines Pneumonia  Doctor Visits Discussed/Reviewed Doctor Visits Reviewed  Algis Downs to keep scheduled appointments with providers. Call to vascular provider, Dr. Wyn Quaker to reschedule patients missed vascular testing and provider follow up appointment.  Provided name and address to new primary provider appointment.]  Education Interventions   Education Provided Provided Education  [Advised importance of keeping up with scheduled provider appointments. Advised to use paper calendar, phone calendar and/ or Mychart to remember scheduled provider appointments.  Reviewed signs / symptoms of infection and when to notify provider.]  Provided Verbal Education On Blood Sugar Monitoring, Other, Eye Care  [Appointment date for vascular ultrasound given to caregiver/son for 07/01/23 at 2 pm.  Appointment date for follow up appointment with Dr. Wyn Quaker given to son for 07/02/23 at 10:45 am. Advised to let provider know he was unable to obtain compression hose.]  Pharmacy Interventions   Pharmacy Dicussed/Reviewed Pharmacy Topics Reviewed  Algis Downs son to pick up patients mounjaro prescription from pharmacy.  Advised to make sure previous doses of mounjaro are removed /  disposed of  to not confuse patient.]  Safety Interventions   Safety Discussed/Reviewed Fall Risk  [Confirmed patient received new wheelchair.]              Our next appointment is by telephone on 07/25/23 at 2 pm  Please call the care guide team at [phone] if you need to cancel or reschedule your appointment.   If you are experiencing a Mental Health or Behavioral Health Crisis or need someone to talk to, please call the Suicide and Crisis Lifeline: 988 call 1-800-273-TALK (toll free, 24 hour hotline)  Patient verbalizes understanding of instructions and care plan provided today and agrees to view in MyChart. Active MyChart status and patient understanding of how to access instructions and care plan via MyChart confirmed with patient.     George Ina RN,BSN,CCM [facility]  [facility], [facility] coordinator / Case Manager Phone: [phone]

## 2023-07-01 ENCOUNTER — Encounter (INDEPENDENT_AMBULATORY_CARE_PROVIDER_SITE_OTHER): Payer: 59

## 2023-07-02 ENCOUNTER — Encounter (INDEPENDENT_AMBULATORY_CARE_PROVIDER_SITE_OTHER): Payer: Self-pay | Admitting: Vascular Surgery

## 2023-07-02 ENCOUNTER — Ambulatory Visit (INDEPENDENT_AMBULATORY_CARE_PROVIDER_SITE_OTHER): Payer: 59 | Admitting: Vascular Surgery

## 2023-07-02 ENCOUNTER — Ambulatory Visit (INDEPENDENT_AMBULATORY_CARE_PROVIDER_SITE_OTHER): Payer: 59

## 2023-07-02 VITALS — BP 125/79 | HR 89 | Resp 16

## 2023-07-02 DIAGNOSIS — M7989 Other specified soft tissue disorders: Secondary | ICD-10-CM

## 2023-07-02 DIAGNOSIS — N183 Chronic kidney disease, stage 3 unspecified: Secondary | ICD-10-CM | POA: Diagnosis not present

## 2023-07-02 DIAGNOSIS — E785 Hyperlipidemia, unspecified: Secondary | ICD-10-CM

## 2023-07-02 DIAGNOSIS — E1169 Type 2 diabetes mellitus with other specified complication: Secondary | ICD-10-CM | POA: Diagnosis not present

## 2023-07-02 DIAGNOSIS — I70213 Atherosclerosis of native arteries of extremities with intermittent claudication, bilateral legs: Secondary | ICD-10-CM | POA: Diagnosis not present

## 2023-07-02 DIAGNOSIS — I10 Essential (primary) hypertension: Secondary | ICD-10-CM | POA: Diagnosis not present

## 2023-07-02 DIAGNOSIS — I5022 Chronic systolic (congestive) heart failure: Secondary | ICD-10-CM | POA: Diagnosis not present

## 2023-07-02 NOTE — Assessment & Plan Note (Signed)
blood pressure control important in reducing the progression of atherosclerotic disease. On appropriate oral medications.  

## 2023-07-02 NOTE — Assessment & Plan Note (Signed)
A venous reflux study was performed today.  He had no evidence of deep venous thrombosis, superficial thrombophlebitis, or significant venous reflux in the right lower extremity.  We also did arterial waveforms and he did have biphasic waveform in the right peroneal artery although no flow was seen in the anterior tibial and posterior tibial arteries. No significant venous disease of note to worsen swelling.  Arterial disease we followed up with formal ABIs in the next few months but no current limb threatening symptoms.  I do recommend he get some sort of compression garment and wear this daily.  He needs to elevate his legs and exercise the right possible.  His swelling is somewhat better and there is no role for lymphedema pump or other therapies at this time.

## 2023-07-02 NOTE — Assessment & Plan Note (Signed)
Status post left BKA.  Biphasic waveforms are present in the right peroneal artery.  We will get formal ABIs in the next few months at his convenience.  Does not have any immediate limb threatening symptoms.

## 2023-07-02 NOTE — Progress Notes (Signed)
MRN : [id number]  Danny Lessa Buczek Sr. is a 72 y.o. (1950/09/15) male who presents with chief complaint of  Chief Complaint  Patient presents with   Follow-up    Ultrasound results  .  History of Present Illness: Patient returns today in follow up of his right leg swelling and known history of PAD.  His swelling is actually a little better today than it has been.  He now has a motorized wheelchair and his legs elevated most of the time.  He did not get the compression socks that was prescribed as he said that insurance would not cover it and he was not going to pay $35 for it.  A venous reflux study was performed today.  He had no evidence of deep venous thrombosis, superficial thrombophlebitis, or significant venous reflux in the right lower extremity.  We also did arterial waveforms and he did have biphasic waveform in the right peroneal artery although no flow was seen in the anterior tibial and posterior tibial arteries.  He has no current wounds or infection.  He is somewhat disheveled and smells of urine today.  Current Outpatient Medications  Medication Sig Dispense Refill   acetaminophen (TYLENOL) 325 MG tablet Take 2 tablets (650 mg total) by mouth every 4 (four) hours as needed for mild pain (or temp > 37.5 C (99.5 F)).     albuterol (PROVENTIL) (2.5 MG/3ML) 0.083% nebulizer solution Take 3 mLs (2.5 mg total) by nebulization every 6 (six) hours as needed for wheezing or shortness of breath. 150 mL 1   albuterol (VENTOLIN HFA) 108 (90 Base) MCG/ACT inhaler Inhale 2 puffs into the lungs every 6 (six) hours as needed for wheezing or shortness of breath. 8 g 2   Albuterol Sulfate (PROAIR RESPICLICK) 108 (90 Base) MCG/ACT AEPB Inhale 1 puff into the lungs every 6 (six) hours as needed. 1 each 2   amLODipine (NORVASC) 10 MG tablet Take 1 tablet (10 mg total) by mouth daily. 90 tablet 1   aspirin EC 81 MG tablet Take 1 tablet (81 mg total) by mouth daily. 90 tablet 3   atorvastatin (LIPITOR)  80 MG tablet Take 1 tablet (80 mg total) by mouth daily. 90 tablet 1   carvedilol (COREG) 25 MG tablet Take 1 tablet (25 mg total) by mouth 2 (two) times daily with a meal. 180 tablet 1   clopidogrel (PLAVIX) 75 MG tablet TAKE 1 TABLET BY MOUTH ONCE DAILY 30 tablet 5   Continuous Glucose Receiver (FREESTYLE LIBRE 2 READER) DEVI 1 Device by Does not apply route 4 (four) times daily. 1 each 0   Continuous Glucose Sensor (FREESTYLE LIBRE 2 SENSOR) MISC Check blood sugars 6 each 3   empagliflozin (JARDIANCE) 25 MG TABS tablet Take 1 tablet (25 mg total) by mouth daily. 90 tablet 1   escitalopram (LEXAPRO) 10 MG tablet Take 1 tablet (10 mg total) by mouth daily. 90 tablet 1   Insulin Pen Needle (ULTRACARE PEN NEEDLES) 32G X 6 MM MISC USE AS DIRECTED. TWICE DAILY 200 each 3   nitroGLYCERIN (NITROSTAT) 0.4 MG SL tablet Place 0.4 mg under the tongue every 5 (five) minutes as needed for chest pain.     spironolactone (ALDACTONE) 25 MG tablet TAKE 1 TABLET (25 MG TOTAL) BY MOUTH DAILY. 90 tablet 1   tamsulosin (FLOMAX) 0.4 MG CAPS capsule Take 1 capsule (0.4 mg total) by mouth daily. 90 capsule 1   tirzepatide (MOUNJARO) 7.5 MG/0.5ML Pen Inject 7.5 mg into  the skin once a week. 2 mL 0   umeclidinium-vilanterol (ANORO ELLIPTA) 62.5-25 MCG/ACT AEPB Inhale 1 puff into the lungs daily. 3 each 11   No current facility-administered medications for this visit.    Past Medical History:  Diagnosis Date   CAD (coronary artery disease)    a. 01/2010 PCI of LAD; b. 09/2014 PCI/DES of mLAD due to ISR. D1 80, D2 80(jailed), LCX 52m; c. 07/2016 NSTEMI/Cath: LM nl, LAD 41m ISR, 40d, D1 90ost, 80p, D2 90ost, RI min irregs, LCX min irregs, OM1 90 small, OM2/3 min irregs, RCA min irregs, RPLB1 90, EF 25-35%.   Chronic combined systolic and diastolic CHF (congestive heart failure) (HCC)    a. 07/2016 Echo: EF 30%, severe septal/anterior HK, Gr1 DD.   CKD (chronic kidney disease), stage III (HCC)    COPD (chronic obstructive  pulmonary disease) (HCC)    DM2 (diabetes mellitus, type 2) (HCC)    Erectile dysfunction    HLD (hyperlipidemia)    HTN (hypertension)    Ischemic cardiomyopathy    a. 07/2016 Echo: EF 30% w/ sev septal/ant HK. Gr1 DD.    Past Surgical History:  Procedure Laterality Date   AMPUTATION Left 11/10/2020   Procedure: AMPUTATION BELOW KNEE;  Surgeon: Annice Needy, MD;  Location: [facility] ORS;  Service: General;  Laterality: Left;   BELOW KNEE LEG AMPUTATION     CAD: stent to the LAD     CARDIAC CATHETERIZATION  10/01/2014   CARDIAC CATHETERIZATION N/A 07/23/2016   Procedure: Left Heart Cath and Coronary Angiography;  Surgeon: Iran Ouch, MD;  Location: [facility] IN[facility]IVE [facility];  Service: Cardiovascular;  Laterality: N/A;   CORONARY ANGIOPLASTY WITH STENT PLACEMENT  10/01/2014   LOWER EXTREMITY ANGIOGRAPHY Left 10/31/2020   Procedure: LOWER EXTREMITY ANGIOGRAPHY;  Surgeon: Annice Needy, MD;  Location: [facility] CV [facility];  Service: Cardiovascular;  Laterality: Left;   LOWER EXTREMITY ANGIOGRAPHY Right 11/20/2021   Procedure: Lower Extremity Angiography;  Surgeon: Annice Needy, MD;  Location: [facility] INV[facility] CV [facility];  Service: Cardiovascular;  Laterality: Right;     Social History   Tobacco Use   Smoking status: Former    Current packs/day: 0.00    Types: Cigarettes    Start date: 09/07/2009    Quit date: 09/07/2009    Years since quitting: 13.8   Smokeless tobacco: Never   Tobacco comments:    quit june 2011  Vaping Use   Vaping status: Never Used  Substance Use Topics   Alcohol use: Not Currently    Alcohol/week: 7.0 standard drinks of alcohol    Types: 6 Cans of beer, 1 Standard drinks or equivalent per week    Comment: last week 07/26/2022   Drug use: Never       Family History  Problem Relation Age of Onset   Heart attack Father        complications   Cancer Brother      Allergies  Allergen Reactions   Metformin And Related Diarrhea    If he takes it twice a day it causes  diarhea, so he reduced to once daily   REVIEW OF SYSTEMS (Negative unless checked)   Constitutional: [] Weight loss  [] Fever  [] Chills Cardiac: [] Chest pain   [] Chest pressure   [] Palpitations   [] Shortness of breath when laying flat   [] Shortness of breath at rest   [x] Shortness of breath with exertion. Vascular:  [] Pain in legs with walking   [] Pain in legs at rest   []   Pain in legs when laying flat   [] Claudication   [] Pain in feet when walking  [] Pain in feet at rest  [] Pain in feet when laying flat   [] History of DVT   [] Phlebitis   [x] Swelling in legs   [] Varicose veins   [] Non-healing ulcers Pulmonary:   [] Uses home oxygen   [] Productive cough   [] Hemoptysis   [] Wheeze  [x] COPD   [] Asthma Neurologic:  [] Dizziness  [] Blackouts   [] Seizures   [] History of stroke   [] History of TIA  [] Aphasia   [] Temporary blindness   [] Dysphagia   [] Weakness or numbness in arms   [] Weakness or numbness in legs Musculoskeletal:  [x] Arthritis   [] Joint swelling   [x] Joint pain   [] Low back pain Hematologic:  [] Easy bruising  [] Easy bleeding   [] Hypercoagulable state   [] Anemic  [] Hepatitis Gastrointestinal:  [] Blood in stool   [] Vomiting blood  [] Gastroesophageal reflux/heartburn   [] Abdominal pain Genitourinary:  [] Chronic kidney disease   [] Difficult urination  [] Frequent urination  [] Burning with urination   [] Hematuria Skin:  [] Rashes   [] Ulcers   [] Wounds Psychological:  [] History of anxiety   []  History of major depression.    Physical Examination  BP 125/79 (BP Location: Left Arm)   Pulse 89   Resp 16  Gen:  WD/WN, NAD.  He is somewhat disheveled and has a strong odor of urine. Head: [place]/AT, No temporalis wasting. Ear/Nose/Throat: Hearing grossly intact, nares w/o erythema or drainage Eyes: Conjunctiva clear. Sclera non-icteric Neck: Supple.  Trachea midline Pulmonary:  Good air movement, no use of accessory muscles.  Cardiac: RRR, no JVD Vascular:  Vessel Right Left  Radial Palpable Palpable                           PT 1+ palpable Not palpable  DP Not palpable Not palpable   Gastrointestinal: soft, non-tender/non-distended. No guarding/reflex.  Musculoskeletal: M/S 5/5 throughout.  Left BKA is well-healed.  1+ right lower extremity edema. Neurologic: Sensation grossly intact in extremities.  Symmetrical.  Speech is fluent.  Psychiatric: Judgment intact, Mood & affect appropriate for pt's clinical situation. Dermatologic: No rashes or ulcers noted.  No cellulitis or open wounds.      Labs Recent Results (from the past 2160 hour(s))  Urinalysis, Routine w reflex microscopic -Urine, Random     Status: Abnormal   Collection Time: 05/19/23  5:34 PM  Result Value Ref Range   Color, Urine STRAW (A) YELLOW   APPearance CLEAR (A) CLEAR   Specific Gravity, Urine 1.027 1.005 - 1.030   pH 5.0 5.0 - 8.0   Glucose, UA >=500 (A) NEGATIVE mg/dL   Hgb urine dipstick NEGATIVE NEGATIVE   Bilirubin Urine NEGATIVE NEGATIVE   Ketones, ur NEGATIVE NEGATIVE mg/dL   Protein, ur NEGATIVE NEGATIVE mg/dL   Nitrite NEGATIVE NEGATIVE   Leukocytes,Ua NEGATIVE NEGATIVE   RBC / HPF 0-5 0 - 5 RBC/hpf   WBC, UA 0-5 0 - 5 WBC/hpf   Bacteria, UA NONE SEEN NONE SEEN   Squamous Epithelial / HPF 0 0 - 5 /HPF   Mucus PRESENT     Comment: Performed at [facility], [address], [place], [place] [address]  Basic metabolic panel     Status: Abnormal   Collection Time: 05/19/23  5:50 PM  Result Value Ref Range   Sodium 137 135 - 145 mmol/L   Potassium 4.2 3.5 - 5.1 mmol/L   Chloride 102 98 - 111  mmol/L   CO2 24 22 - 32 mmol/L   Glucose, Bld 369 (H) 70 - 99 mg/dL    Comment: Glucose reference range applies only to samples taken after fasting for at least 8 hours.   BUN 37 (H) 8 - 23 mg/dL   Creatinine, Ser 2.13 (H) 0.61 - 1.24 mg/dL   Calcium 9.0 8.9 - 08.6 mg/dL   GFR, Estimated 52 (L) >60 mL/min    Comment: (NOTE) Calculated using the CKD-EPI Creatinine Equation (2021)    Anion  gap 11 5 - 15    Comment: Performed at [facility], [address] [place], [place] [address]  CBC     Status: None   Collection Time: 05/19/23  5:50 PM  Result Value Ref Range   WBC 7.9 4.0 - 10.5 K/uL   RBC 5.11 4.22 - 5.81 MIL/uL   Hemoglobin 15.8 13.0 - 17.0 g/dL   HCT 96.2 95.2 - 84.1 %   MCV 95.3 80.0 - 100.0 fL   MCH 30.9 26.0 - 34.0 pg   MCHC 32.4 30.0 - 36.0 g/dL   RDW 32.4 40.1 - 02.7 %   Platelets 240 150 - 400 K/uL   nRBC 0.0 0.0 - 0.2 %    Comment: Performed at [facility], [address] [place], [place] [address]  CBG monitoring, ED     Status: Abnormal   Collection Time: 05/19/23 10:22 PM  Result Value Ref Range   Glucose-Capillary 191 (H) 70 - 99 mg/dL    Comment: Glucose reference range applies only to samples taken after fasting for at least 8 hours.  CBG monitoring, ED     Status: Abnormal   Collection Time: 05/30/23 10:53 PM  Result Value Ref Range   Glucose-Capillary 376 (H) 70 - 99 mg/dL    Comment: Glucose reference range applies only to samples taken after fasting for at least 8 hours.  CBC     Status: None   Collection Time: 05/30/23 10:56 PM  Result Value Ref Range   WBC 7.7 4.0 - 10.5 K/uL   RBC 4.95 4.22 - 5.81 MIL/uL   Hemoglobin 15.5 13.0 - 17.0 g/dL   HCT 44.0 34.7 - 42.5 %   MCV 97.2 80.0 - 100.0 fL   MCH 31.3 26.0 - 34.0 pg   MCHC 32.2 30.0 - 36.0 g/dL   RDW 95.6 38.7 - 56.4 %   Platelets 222 150 - 400 K/uL   nRBC 0.0 0.0 - 0.2 %    Comment: Performed at [facility], [address] [place], [place] [address]  Urinalysis, Routine w reflex microscopic -Urine, Clean Catch     Status: Abnormal   Collection Time: 05/30/23 10:56 PM  Result Value Ref Range   Color, Urine YELLOW (A) YELLOW   APPearance CLEAR (A) CLEAR   Specific Gravity, Urine 1.026 1.005 - 1.030   pH 5.0 5.0 - 8.0   Glucose, UA >=500 (A) NEGATIVE mg/dL   Hgb urine dipstick NEGATIVE NEGATIVE   Bilirubin Urine NEGATIVE NEGATIVE   Ketones,  ur NEGATIVE NEGATIVE mg/dL   Protein, ur NEGATIVE NEGATIVE mg/dL   Nitrite NEGATIVE NEGATIVE   Leukocytes,Ua NEGATIVE NEGATIVE   RBC / HPF 0-5 0 - 5 RBC/hpf   WBC, UA 0-5 0 - 5 WBC/hpf   Bacteria, UA NONE SEEN NONE SEEN   Squamous Epithelial / HPF 0-5 0 - 5 /HPF    Comment: Performed at [facility], [address] [place], [place] [address]  Comprehensive metabolic panel     Status: Abnormal   Collection Time: 05/30/23 10:56 PM  Result Value Ref Range   Sodium 137 135 - 145 mmol/L   Potassium 4.1 3.5 - 5.1 mmol/L   Chloride 103 98 - 111 mmol/L   CO2 26 22 - 32 mmol/L   Glucose, Bld 398 (H) 70 - 99 mg/dL    Comment: Glucose reference range applies only to samples taken after fasting for at least 8 hours.   BUN 36 (H) 8 - 23 mg/dL   Creatinine, Ser 4.69 0.61 - 1.24 mg/dL   Calcium 9.0 8.9 - 62.9 mg/dL   Total Protein 7.0 6.5 - 8.1 g/dL   Albumin 4.1 3.5 - 5.0 g/dL   AST 18 15 - 41 U/L   ALT 31 0 - 44 U/L   Alkaline Phosphatase 88 38 - 126 U/L   Total Bilirubin 0.5 0.3 - 1.2 mg/dL   GFR, Estimated >52 >84 mL/min    Comment: (NOTE) Calculated using the CKD-EPI Creatinine Equation (2021)    Anion gap 8 5 - 15    Comment: Performed at [facility], [address] [place], [place] [address]  CBG monitoring, ED     Status: Abnormal   Collection Time: 05/31/23  4:21 AM  Result Value Ref Range   Glucose-Capillary 185 (H) 70 - 99 mg/dL    Comment: Glucose reference range applies only to samples taken after fasting for at least 8 hours.  POCT HgB A1C     Status: Abnormal   Collection Time: 06/05/23 11:51 AM  Result Value Ref Range   Hemoglobin A1C 9.1 (A) 4.0 - 5.6 %   HbA1c POC (<> result, manual entry) 9.1 4.0 - 5.6 %   HbA1c, POC (prediabetic range) 9.1 (A) 5.7 - 6.4 %   HbA1c, POC (controlled diabetic range) 9.1 (A) 0.0 - 7.0 %    Radiology No results found.  Assessment/Plan  Essential hypertension blood pressure control important in reducing the  progression of atherosclerotic disease. On appropriate oral medications.   Swelling of limb A venous reflux study was performed today.  He had no evidence of deep venous thrombosis, superficial thrombophlebitis, or significant venous reflux in the right lower extremity.  We also did arterial waveforms and he did have biphasic waveform in the right peroneal artery although no flow was seen in the anterior tibial and posterior tibial arteries. No significant venous disease of note to worsen swelling.  Arterial disease we followed up with formal ABIs in the next few months but no current limb threatening symptoms.  I do recommend he get some sort of compression garment and wear this daily.  He needs to elevate his legs and exercise the right possible.  His swelling is somewhat better and there is no role for lymphedema pump or other therapies at this time.  Atherosclerosis of native arteries of extremity with intermittent claudication (HCC) Status post left BKA.  Biphasic waveforms are present in the right peroneal artery.  We will get formal ABIs in the next few months at his convenience.  Does not have any immediate limb threatening symptoms.  Chronic systolic heart failure (HCC) Could certainly worsen lower extremity swelling.   Type 2 diabetes mellitus with hyperlipidemia (HCC) blood glucose control important in reducing the progression of atherosclerotic disease. Also, involved in wound healing. On appropriate medications.     CKD (chronic kidney disease) stage 3, GFR 30-59 ml/min Can certainly worsen lower extremity swelling.  Festus Barren,  MD  07/02/2023 12:02 PM    This note was created with Dragon medical transcription system.  Any errors from dictation are purely unintentional

## 2023-07-22 IMAGING — CT CT HEAD W/O CM
4 of 6 series · 14 of 47 positions shown, 15 images · non-contrast
Comparison: Earlier same day

CLINICAL DATA: Hemorrhagic stroke, follow-up



[Series 3: head without · axial · non-contrast · 0.42mm/px · z∈[-94,-10]mm · 3 of 35 slices shown, 4 images]
[im 9/35  brain]
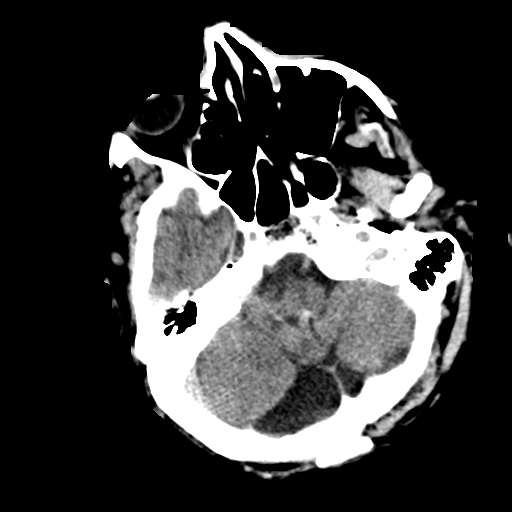
[im 9/35  bone]
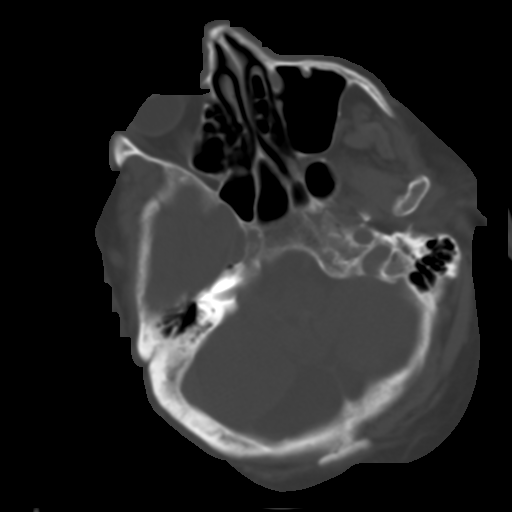
[im 18/35  brain]
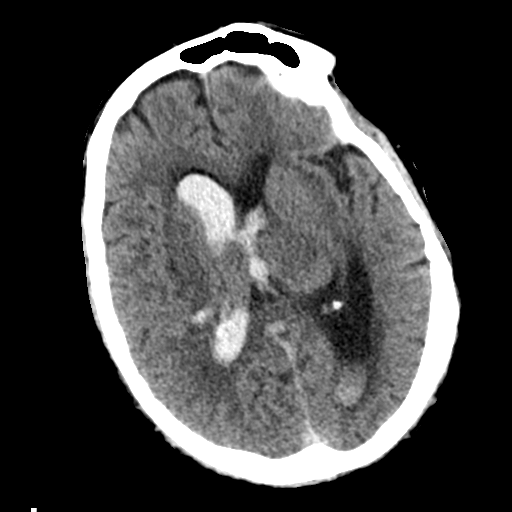
[im 26/35  brain]
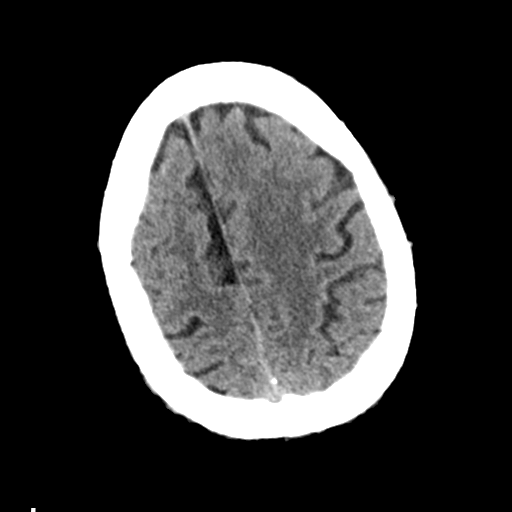

[Series 6: head without cor · coronal · non-contrast · 0.26mm/px · 3 of 73 slices shown]
[im 25/73  brain]
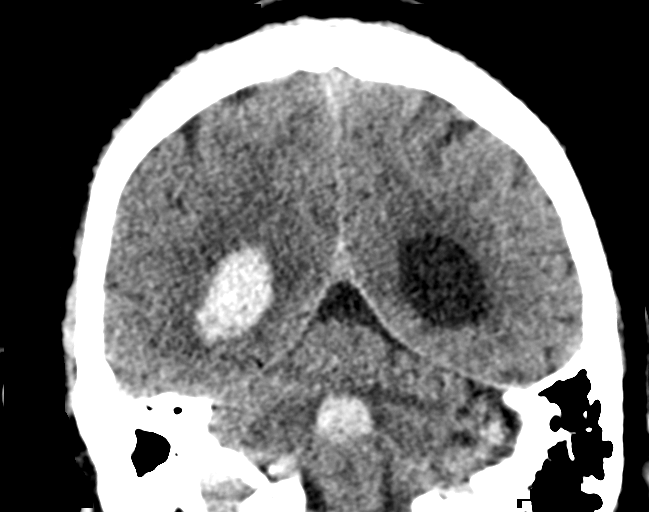
[im 33/73  brain]
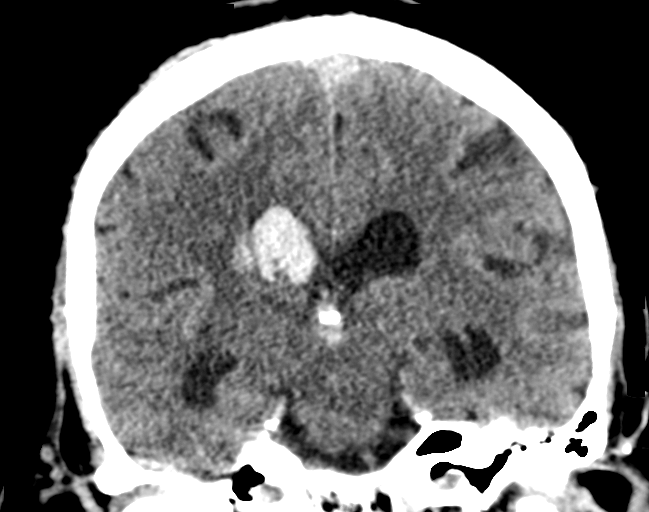
[im 41/73  brain]
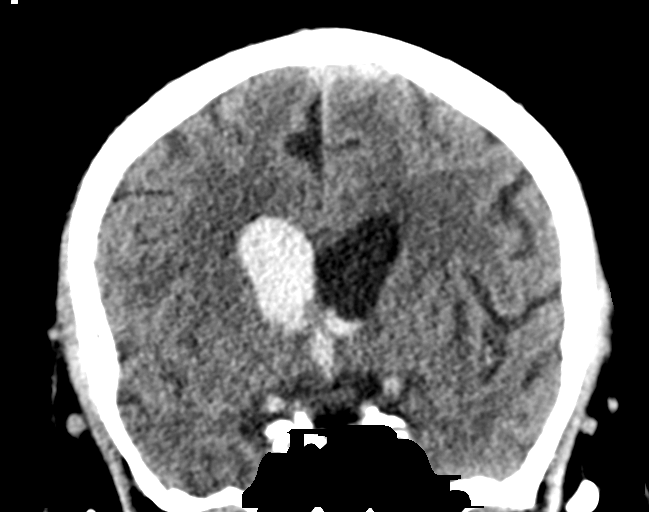

[Series 7: head without sag · sagittal · non-contrast · 0.27mm/px · 3 of 54 slices shown]
[im 12/54  brain]
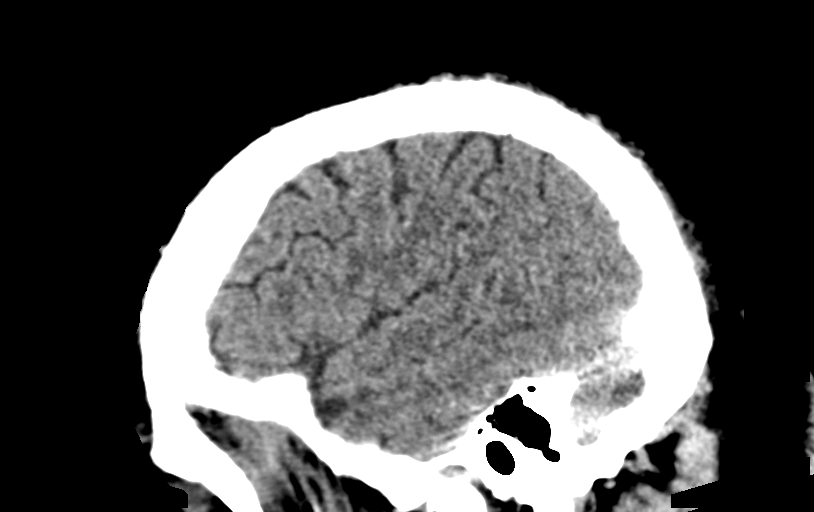
[im 21/54  brain]
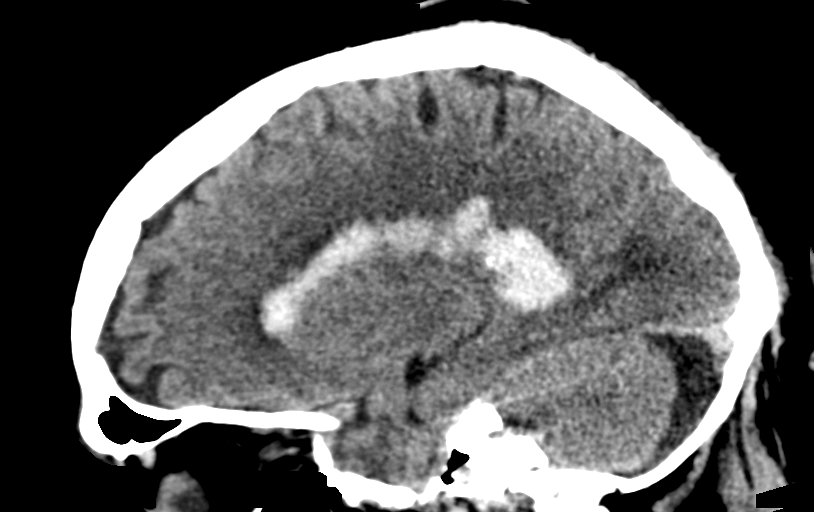
[im 30/54  brain]
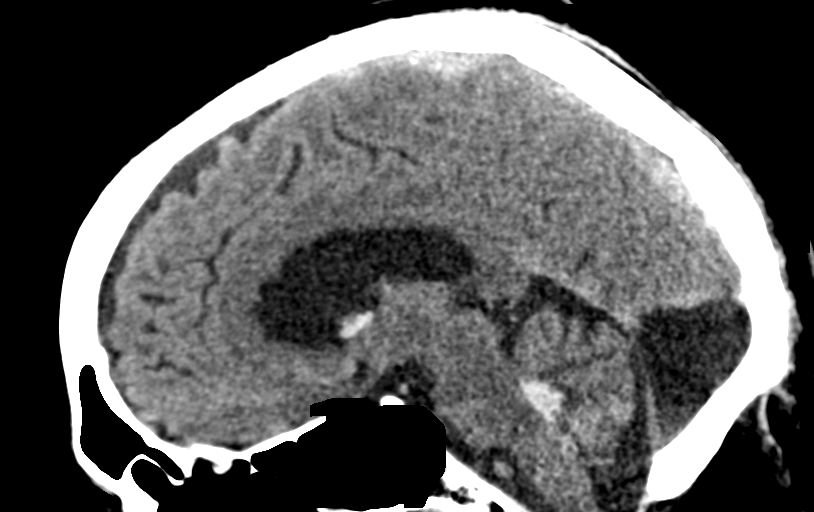

[Series 8: head axial · axial · 0.31mm/px · z∈[-76,-14]mm · 5 of 66 slices shown]
[im 7/66  brain]
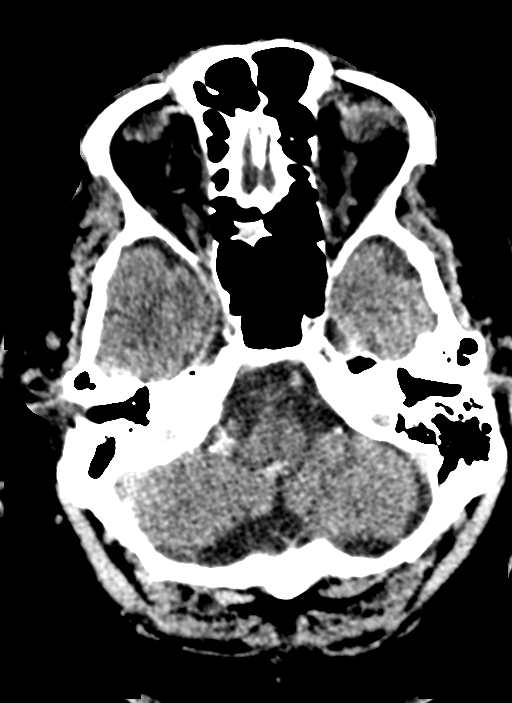
[im 14/66  brain]
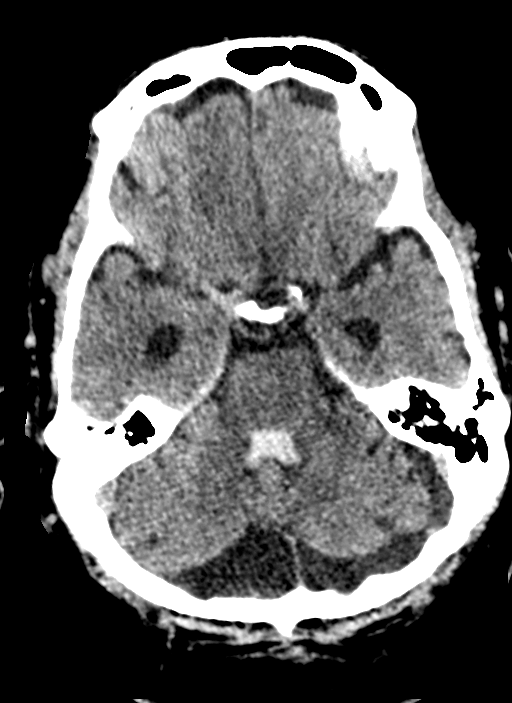
[im 20/66  brain]
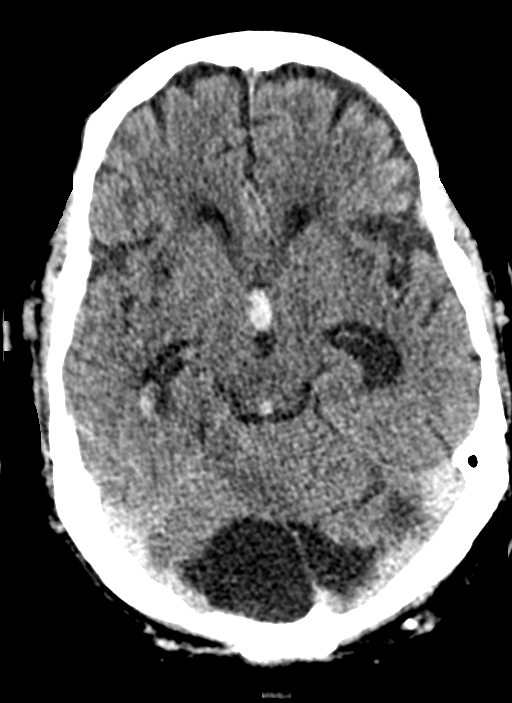
[im 27/66  brain]
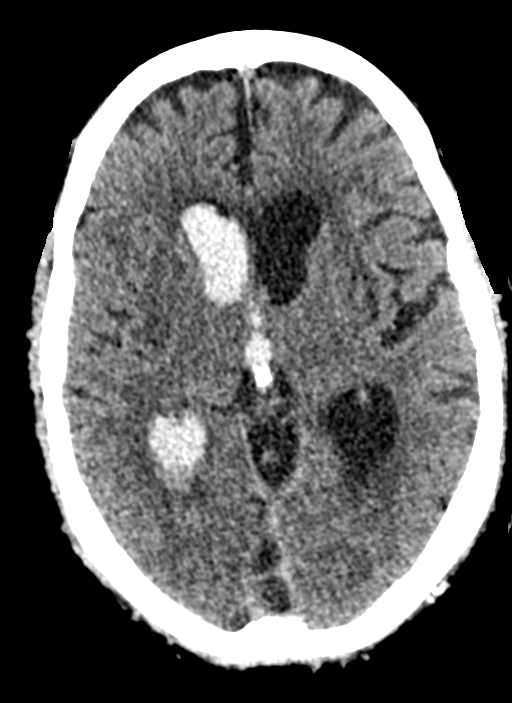
[im 40/66  brain]
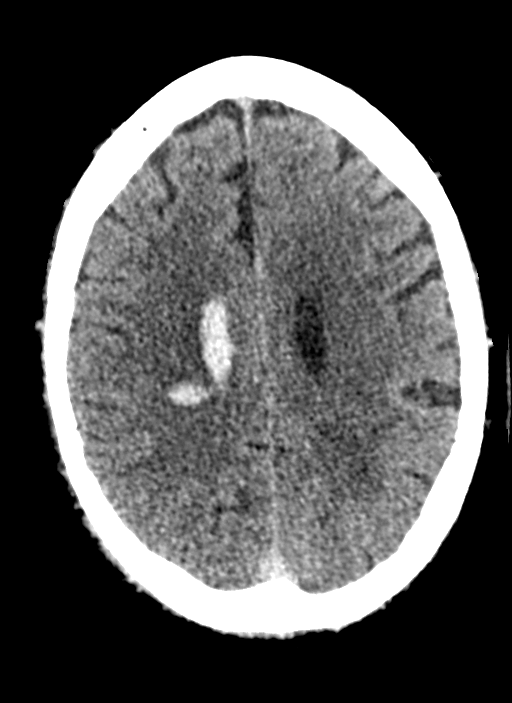

[14 of 47 positions shown; findings below may reference images not displayed]

FINDINGS: Brain: Similar area of parenchymal hemorrhage within the
periventricular white matter along the body of the right lateral
ventricle. Significant intraventricular hemorrhage is again
identified. Stable hydrocephalus. No new hemorrhage.

No new loss of gray-white differentiation. Stable findings of
probable chronic microvascular ischemic changes in the cerebral
white matter. Mega cisterna magna is again incidentally noted.

Vascular: No new abnormality.

Skull: Calvarium is unremarkable.

Sinuses/Orbits: No acute finding.

Other: None.
IMPRESSION: No significant change in small parenchymal hemorrhage adjacent to
the body of the right lateral ventricle. Similar intraventricular
hemorrhage and mild communicating hydrocephalus.

## 2023-07-22 IMAGING — CT CT CERVICAL SPINE W/O CM
3 of 4 series · 12 of 34 positions shown, 14 images · non-contrast
Comparison: 11/28/2020

CLINICAL DATA: Neck trauma (Age >= 65y)



[Series 6: orthogonal bone · axial · 0.36mm/px · z∈[-340,-178]mm · 4 of 125 slices shown, 5 images]
[im 18/125  soft-tissue]
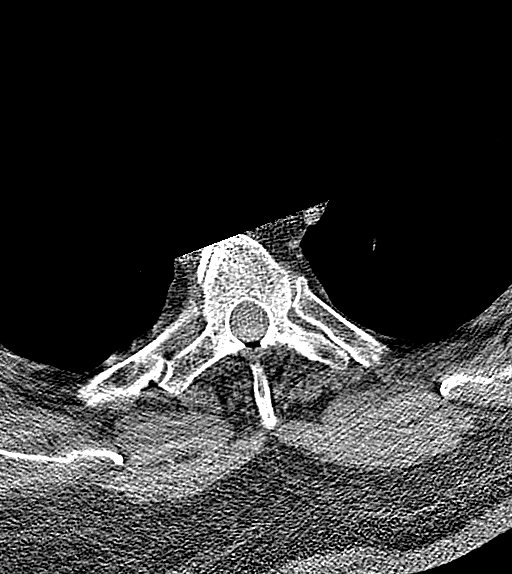
[im 18/125  bone]
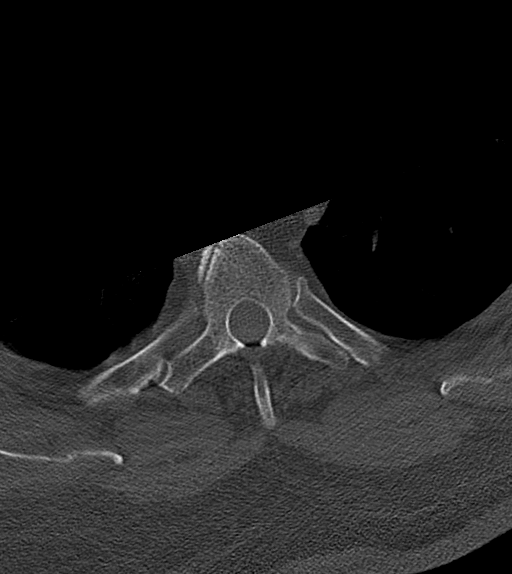
[im 54/125  bone]
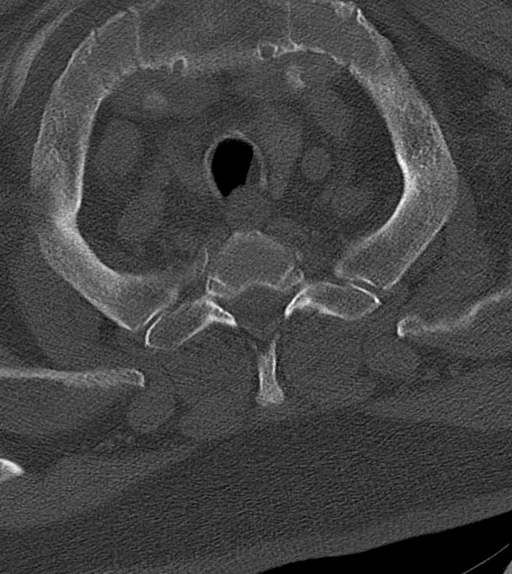
[im 71/125  bone]
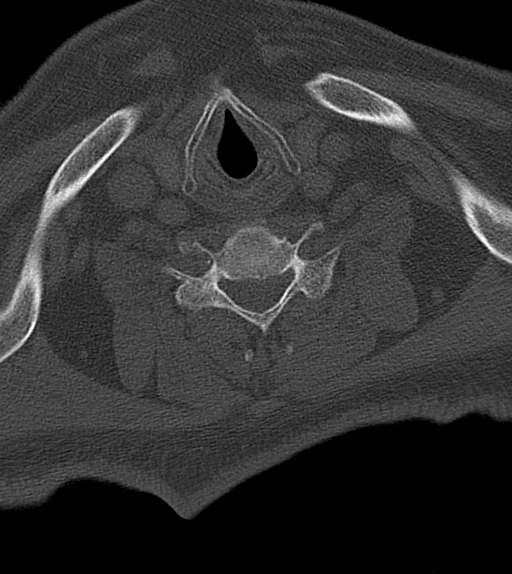
[im 107/125  bone]
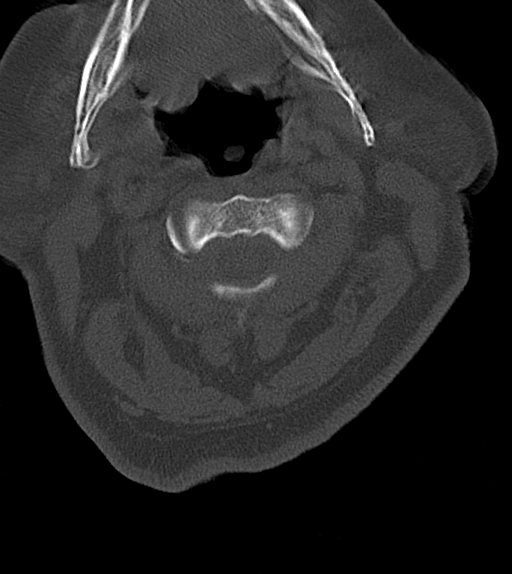

[Series 7: sagittal bone · sagittal · 0.35mm/px · 5 of 108 slices shown, 6 images]
[im 36/108  bone]
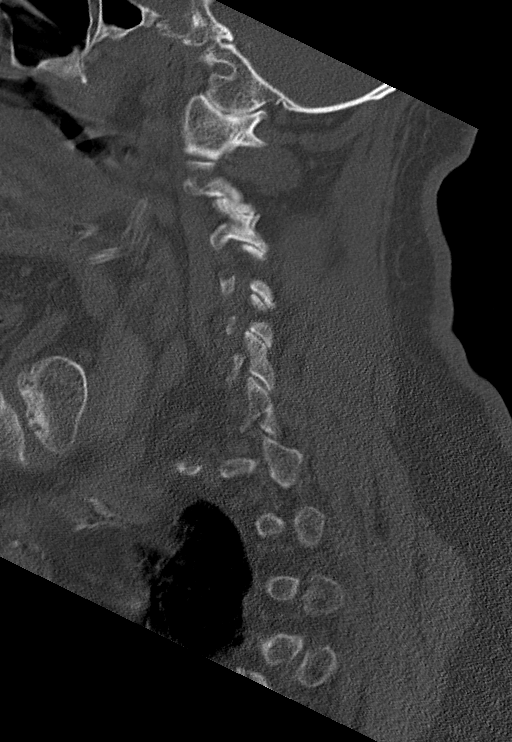
[im 45/108  bone]
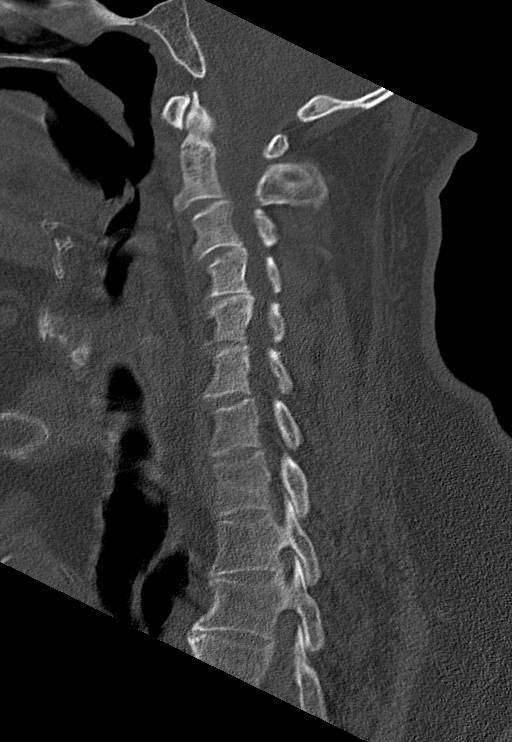
[im 54/108  soft-tissue]
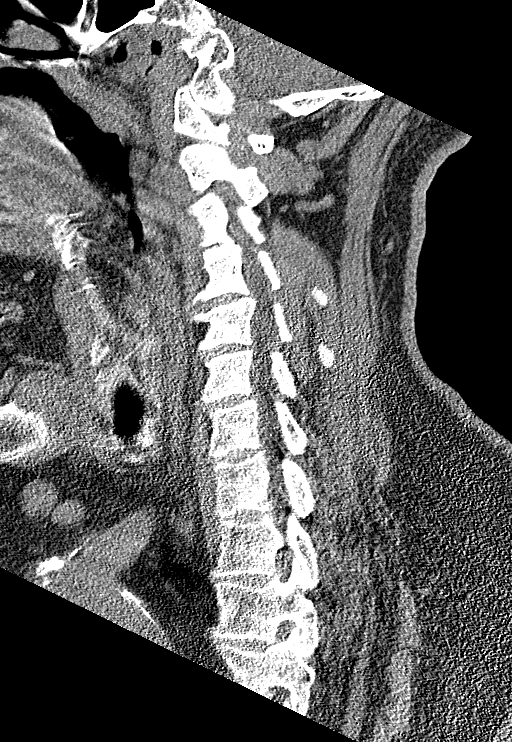
[im 54/108  bone]
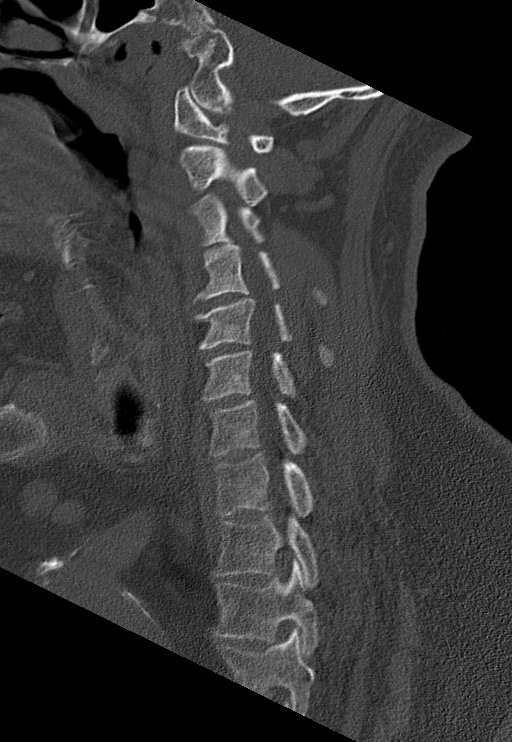
[im 63/108  bone]
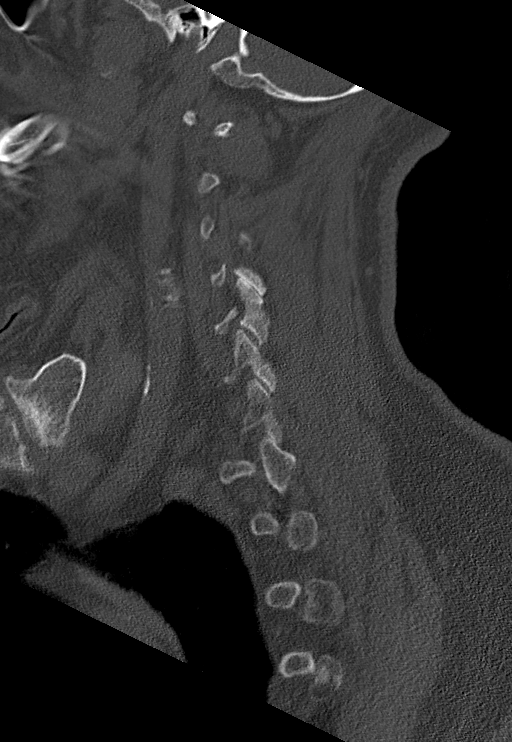
[im 72/108  bone]
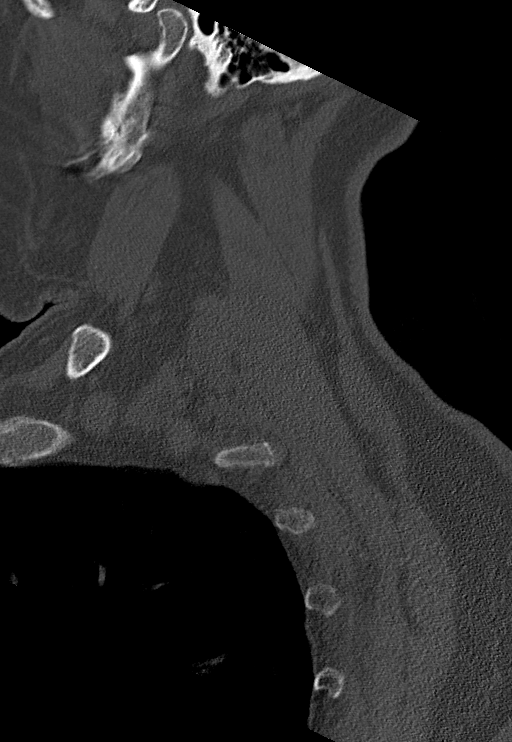

[Series 8: coronal bone · coronal · 0.37mm/px · 3 of 91 slices shown]
[im 29/91  bone]
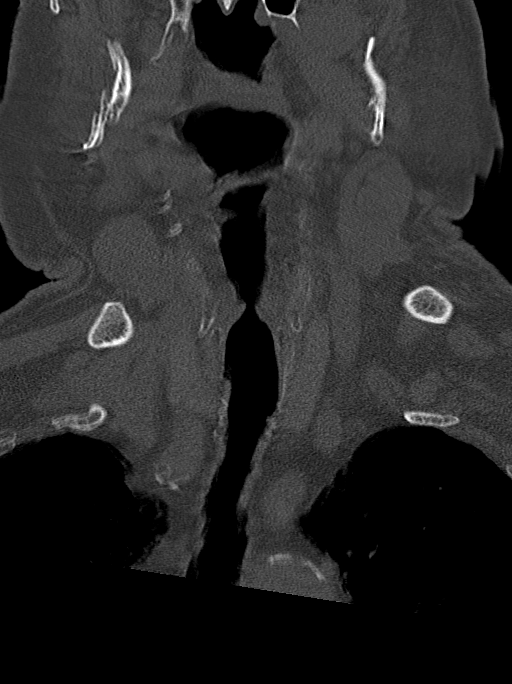
[im 40/91  bone]
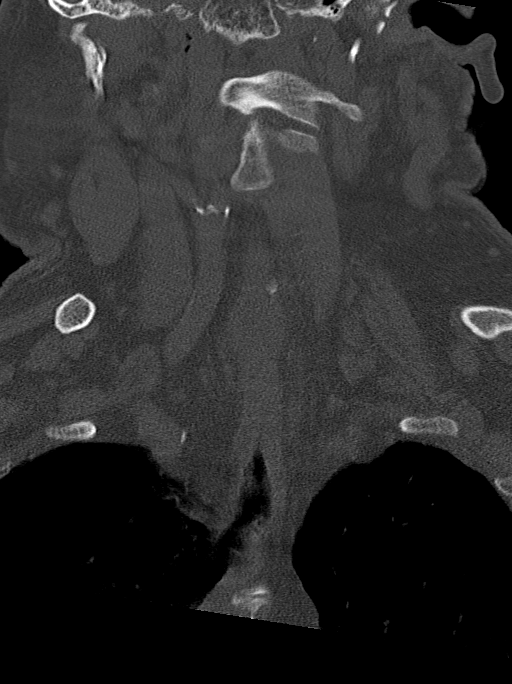
[im 51/91  bone]
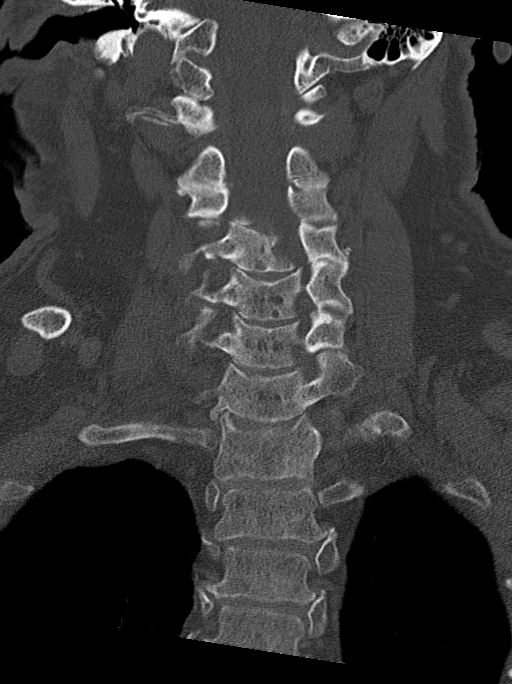

[12 of 34 positions shown; findings below may reference images not displayed]

FINDINGS: Alignment: Stable.

Skull base and vertebrae: No acute fracture. Vertebral body heights
are maintained.

Soft tissues and spinal canal: No prevertebral fluid or swelling. No
visible canal hematoma.

Disc levels:  Mild multilevel degenerative changes.

Upper chest: Bullous emphysema.

Other: Calcified plaque at the common carotid bifurcations.
IMPRESSION: No acute cervical spine fracture.

## 2023-07-23 DIAGNOSIS — Z9181 History of falling: Secondary | ICD-10-CM | POA: Diagnosis not present

## 2023-07-23 DIAGNOSIS — Z89512 Acquired absence of left leg below knee: Secondary | ICD-10-CM | POA: Diagnosis not present

## 2023-07-23 DIAGNOSIS — J449 Chronic obstructive pulmonary disease, unspecified: Secondary | ICD-10-CM | POA: Diagnosis not present

## 2023-07-24 ENCOUNTER — Other Ambulatory Visit: Payer: Self-pay | Admitting: Family Medicine

## 2023-07-24 DIAGNOSIS — E785 Hyperlipidemia, unspecified: Secondary | ICD-10-CM

## 2023-07-25 ENCOUNTER — Ambulatory Visit: Payer: Self-pay

## 2023-07-25 DIAGNOSIS — R531 Weakness: Secondary | ICD-10-CM | POA: Diagnosis not present

## 2023-07-25 NOTE — Patient Outreach (Addendum)
Care Coordination   Follow Up Visit Note   07/25/2023 Name: Jeston Stathis Fawbush Sr. MRN: [id number] DOB: [birth year]  Gerrit Friends Gude Sr. is a 72 y.o. year old male who sees [name], Yehuda Mao, MD for primary care. I  spoke with son, Valon Reinoehl.  What matters to the patients health and wellness today?  Son reports patient having follow up visit with vascular doctor on 07/02/23. Per chart review of vascular providers note swelling in patients right leg has gotten a little better.  Son states patient unable to afford compression hose. Son states patient has [employer] through insurance plan that helps with food and to pay bills. He states he and patient help each other due to son not working.  Son states patient is receiving aide services at least 2-3 days per week. Reports had a 2-3 week period where aide did not come out.  Son states he spoke with person from company that provides the aide services on yesterday and reported aides absence.   Son states aide came out today for his dad but only stayed a short period of time and didn't complete washing the dishes.  Son states he helps patient with his bath. He states patient will bathe 1 x per week. Son states patient had a cold last weeks however he seems to be doing better today. Son states patients blood sugars seem to be ranging in the 120's.  He states patient continues to monitor his blood sugar with his Freestyle libre. Son reports putting patients new libre sensor on this week. Son denies patient having any increase in COPD symptoms.     Goals Addressed             This Visit's Progress    continued improvement post hospitalization/ management of health conditions and obtain community resource needs.       Interventions Today    Flowsheet Row Most Recent Value  Chronic Disease   Chronic disease during today's visit Diabetes, Chronic Obstructive Pulmonary Disease (COPD)  General Interventions   General Interventions Discussed/Reviewed  [facility], General Interventions Reviewed, Doctor Visits, Communication with  [facility] of treatment plan for COPD and community resource needs and patients adherence to plan as established by provider.  Assessed for blood sugar readings/ COPD  / Cold symptoms. Advised to notify provider for frequent low BS <70 or > 250.]  Doctor Visits Discussed/Reviewed Doctor Visits Reviewed  Annabell Sabal upcoming provider visits. Advised to follow up with providers as recommended.]  Communication with Social Work, Systems developer to Child psychotherapist for additional follow up with patient due to son's concerns regarding current personal care services. Patient transferred to case manager Rodney Langton due to primary care provider change.]  Education Interventions   Education Provided Provided Education  [Advised son to contact personal care aide agency if he has concerns regarding aide showing up when scheduled and on time. Reviewed management for hypo/ hyperglycemia. Utilized teach back method to confirm son understand.]  Provided Verbal Education On Other, Blood Sugar Monitoring  [discussed importance of good hygiene for patient. Inquired if patient is being assisted with bathing. confirmed patient getting necessary supplies ( depends/ diapers) needed. Confirmed patient monitoring BS with Freestyle libre.]  Pharmacy Interventions   Pharmacy Dicussed/Reviewed Pharmacy Topics Reviewed  [discussed medications and advised to take medicaitons as prescribed.]                  SDOH assessments and interventions completed:  Yes  SDOH Interventions Today  Flowsheet Row Most Recent Value  SDOH Interventions   Health Literacy Interventions Intervention Not Indicated        Care Coordination Interventions:  Yes, provided   Follow up plan: Follow up call scheduled for 08/23/23 at 2 pm.  Patient moving to new practice [facility] practice on 09/03/23.  Patient will be transferred to new RN case  manager, Rodney Langton, RN.      Encounter Outcome:  Patient Visit Completed   George Ina RN,BSN,CCM [facility]  [facility] coordinator / Case Manager Phone: [phone]

## 2023-07-25 NOTE — Patient Instructions (Signed)
Visit Information  Thank you for taking time to visit with me today. Please don't hesitate to contact me if I can be of assistance to you.   Following are the goals we discussed today:   Goals Addressed             This Visit's Progress    continued improvement post hospitalization/ management of health conditions and obtain community resource needs.       Interventions Today    Flowsheet Row Most Recent Value  Chronic Disease   Chronic disease during today's visit Chronic Obstructive Pulmonary Disease (COPD)  General Interventions   General Interventions Discussed/Reviewed [employer], General Interventions Reviewed, Doctor Visits, Communication with  [employer] of treatment plan for COPD and community resource needs and patients adherence to plan as established by provider.  Assessed for blood sugar readings/ COPD  / Cold symptoms. Advised to notify provider for frequent low BS <70 or > 250.]  Doctor Visits Discussed/Reviewed Doctor Visits Reviewed  Annabell Sabal upcoming provider visits. Advised to follow up with providers as recommended.]  Communication with Social Work, Systems developer to Child psychotherapist for additional follow up with patient due to son's concerns regarding current personal care services. Patient transferred to case manager Rodney Langton due to primary care provider change.]  Education Interventions   Education Provided Provided Education  [Advised son to contact personal care aide agency if he has concerns regarding aide showing up when scheduled and on time. Reviewed management for hypo/ hyperglycemia. Utilized teach back method to confirm son understand.]  Provided Verbal Education On Other, Blood Sugar Monitoring  [discussed importance of good hygiene for patient. Inquired if patient is being assisted with bathing. confirmed patient getting necessary supplies ( depends/ diapers) needed. Confirmed patient monitoring BS with [employer].]  Pharmacy Interventions    Pharmacy Dicussed/Reviewed Pharmacy Topics Reviewed  [discussed medications and advised to take medicaitons as prescribed.]                  Our next appointment is by telephone on 08/23/23 at 2 pm  Please call the care guide team at [phone] if you need to cancel or reschedule your appointment.   If you are experiencing a Mental Health or Behavioral Health Crisis or need someone to talk to, please call the Suicide and Crisis Lifeline: 988 call 1-800-273-TALK (toll free, 24 hour hotline)  Patient verbalizes understanding of instructions and care plan provided today and agrees to view in MyChart. Active MyChart status and patient understanding of how to access instructions and care plan via MyChart confirmed with patient.     George Ina RN,BSN,CCM [place]  [facility], [facility] coordinator / Case Manager Phone: [phone]

## 2023-08-06 ENCOUNTER — Ambulatory Visit: Payer: Self-pay | Admitting: *Deleted

## 2023-08-06 NOTE — Patient Outreach (Signed)
  Care Coordination   Follow Up Visit Note   08/06/2023 Name: Danny Sturdevant Yoshino Sr. MRN: [id number] DOB: 1951-06-17  Danny HERO Delamater Sr. is a 71 y.o. year old male who sees Maribeth, Camellia MATSU, MD for primary care. I spoke with  Danny HERO Rummer Sr. 's son by phone today.  What matters to the patients health and wellness today?  Assistance with transfers to wheelchair    Goals Addressed             This Visit's Progress    Patient's son requesting assistance options for transfers to wheelchair       Activities and task to complete in order to accomplish goals.   Self Support options  (patient to continue to received personal care services-24 hours per month, son to request assistance with baths as well as household tasks. CSW to discuss options to assists with transfers with treatment team )         SDOH assessments and interventions completed:  No     Care Coordination Interventions:  Yes, provided  Interventions Today    Flowsheet Row Most Recent Value  Chronic Disease   Chronic disease during today's visit Diabetes, Chronic Obstructive Pulmonary Disease (COPD)  General Interventions   General Interventions Discussed/Reviewed General Interventions Reviewed, Doctor Visits, Level of Care, Durable Medical Equipment (DME), Communication with  [Follow up on community resource needs-]  Doctor Visits Discussed/Reviewed Doctor Visits Reviewed  [facility] 1/28, [facility] 2/25]  Durable Medical Equipment (DME) Wheelchair  [pt's son confirms that wheelchair has been received, however pt is having trouble transfering into the chair-requesting assistance with options]  Wheelchair Motorized  Communication with RN  [RN to discuss options for transfers from chair/bed to wheelchair]  Level of Care Personal Care Services  [confirmed that patient has been approved for 24 hours per month to assist with ADL's-did not come last week due to pt having the flu-son encouraged to discuss specific request for care  with aid/agency]  Pharmacy Interventions   Pharmacy Dicussed/Reviewed Pharmacy Topics Discussed  [confirmed that patient now has pill packs]       Follow up plan: Follow up call scheduled for 08/22/23    Encounter Outcome:  Patient Visit Completed

## 2023-08-06 NOTE — Patient Instructions (Signed)
 Visit Information  Thank you for taking time to visit with me today. Please don't hesitate to contact me if I can be of assistance to you.   Following are the goals we discussed today:   Goals Addressed             This Visit's Progress    Patient's son requesting assistance options for transfers to wheelchair       Activities and task to complete in order to accomplish goals.   Self Support options  (patient to continue to received personal care services-24 hours per month, son to request assistance with baths as well as household tasks. CSW to discuss options to assists with transfers with treatment team )         Our next appointment is by telephone on 08/22/23 at 1pm  Please call the care guide team at [phone] if you need to cancel or reschedule your appointment.   If you are experiencing a Mental Health or Behavioral Health Crisis or need someone to talk to, please call 911   Patient verbalizes understanding of instructions and care plan provided today and agrees to view in MyChart. Active MyChart status and patient understanding of how to access instructions and care plan via MyChart confirmed with patient.     Telephone follow up appointment with care management team member scheduled for: 08/22/23  Lenn Mean, LCSW [place]  [facility], [facility] Licensed Clinical Social Worker Care Coordinator  Direct Dial : [phone]

## 2023-08-08 ENCOUNTER — Telehealth: Payer: Self-pay | Admitting: *Deleted

## 2023-08-08 NOTE — Patient Instructions (Signed)
 Visit Information  Thank you for taking time to visit with me today. Please don't hesitate to contact me if I can be of assistance to you.   Following are the goals we discussed today:   Goals Addressed             This Visit's Progress    Patient's son requesting assistance options for transfers to wheelchair       Activities and task to complete in order to accomplish goals.   Self Support options  (patient to continue to received personal care services-24 hours per month, son to request assistance with baths as well as household tasks. CSW has request PT consult to assist with transfers, resources for affordable phone service provided-[employer] and [employer] )         Our next appointment is by telephone on 08/22/23 at 1pm  Please call the care guide team at [phone] if you need to cancel or reschedule your appointment.   If you are experiencing a Mental Health or Behavioral Health Crisis or need someone to talk to, please call 911   Patient verbalizes understanding of instructions and care plan provided today and agrees to view in MyChart. Active MyChart status and patient understanding of how to access instructions and care plan via MyChart confirmed with patient.     Telephone follow up appointment with care management team member scheduled for: 08/22/23  Lenn Mean, LCSW [place]  [facility], [facility] Licensed Clinical Social Worker Care Coordinator  Direct Dial : [phone]

## 2023-08-08 NOTE — Patient Outreach (Signed)
  Care Coordination   Follow Up Visit Note   08/08/2023 Name: Danny Samek Fluharty Sr. MRN: [id number] DOB: Apr 30, 1951  Danny HERO Gin Sr. is a 73 y.o. year old male who sees Maribeth, Camellia MATSU, MD for primary care. I spoke with  Danny HERO Bing Sr. Son Danny [name], Danny [name]. by phone today.  What matters to the patients health and wellness today?  Assistance with transfer from bed/chair to wheelchair, resources for affordable phone service    Goals Addressed             This Visit's Progress    Patient's son requesting assistance options for transfers to wheelchair       Activities and task to complete in order to accomplish goals.   Self Support options  (patient to continue to received personal care services-24 hours per month, son to request assistance with baths as well as household tasks. CSW has request PT consult to assist with transfers, resources for affordable phone service provided-[employer] and [employer] )         SDOH assessments and interventions completed:  No     Care Coordination Interventions:  Yes, provided   Interventions Today    Flowsheet Row Most Recent Value  Chronic Disease   Chronic disease during today's visit Chronic Obstructive Pulmonary Disease (COPD), Diabetes  General Interventions   General Interventions Discussed/Reviewed General Interventions Reviewed, Durable Medical Equipment (DME), Doctor Visits, [employer]  [confirmed that patient continues to have difficulty with transfers to his wheelchair, PT consult requested to assist with teaching. [employer] resources for affordable phone service provided([employer], [employer])]  Horticulturist, Commercial (DME) Wheelchair  [Consult for PT requested to assist with teaching on transfers]          Follow up plan: Follow up call scheduled for 08/22/23    Encounter Outcome:  Patient Visit Completed

## 2023-08-09 ENCOUNTER — Telehealth: Payer: Self-pay | Admitting: *Deleted

## 2023-08-09 NOTE — Patient Instructions (Signed)
 Visit Information  Thank you for taking time to visit with me today. Please don't hesitate to contact me if I can be of assistance to you.   Following are the goals we discussed today:   Goals Addressed             This Visit's Progress    Patient's son requesting assistance options for transfers to wheelchair       Activities and task to complete in order to accomplish goals.    Patient to continue to received personal care services-24 hours per month, son to   review expectations for in home assistance with aid.  CSW has request PT consult to assist with transfers,  son understands that face to face  visit is required before order can be written-appt scheduled with new provider on 09/03/23 Resources for affordable phone service provided-[employer] and [employer]          Our next appointment is by telephone on 08/22/23 at 1pm  Please call the care guide team at [phone] if you need to cancel or reschedule your appointment.   If you are experiencing a Mental Health or Behavioral Health Crisis or need someone to talk to, please call 911   Patient verbalizes understanding of instructions and care plan provided today and agrees to view in MyChart. Active MyChart status and patient understanding of how to access instructions and care plan via MyChart confirmed with patient.     Telephone follow up appointment with care management team member scheduled for: 08/22/23  Lenn Mean, LCSW [place]  [facility], [facility] Health Licensed Clinical Social Worker Care Coordinator  Direct Dial : [phone]

## 2023-08-09 NOTE — Patient Outreach (Addendum)
  Care Coordination   Follow Up Visit Note   08/09/2023 Name: Danny [name] Speaker Sr. MRN: [id number] DOB: 12-05-1950  Danny [name] Sr. is a 73 y.o. year old male who sees Maribeth, Camellia MATSU, MD for primary care. I spoke with  Danny [name] Sr. 's son Danny [name]. by phone today.  What matters to the patients health and wellness today?  Home Health PT, affordable phone service    Goals Addressed             This Visit's Progress    Patient's son requesting assistance options for transfers to wheelchair       Activities and task to complete in order to accomplish goals.    Patient to continue to received personal care services-24 hours per month, son to   review expectations for in home assistance with aid.  CSW has request PT consult to assist with transfers,  son understands that face to face  visit is required before order can be written-appt scheduled with new provider on 09/03/23 Resources for affordable phone service provided-[employer] and [employer]          SDOH assessments and interventions completed:  No     Care Coordination Interventions:  Yes, provided  Interventions Today    Flowsheet Row Most Recent Value  Chronic Disease   Chronic disease during today's visit Chronic Obstructive Pulmonary Disease (COPD), Diabetes  General Interventions   General Interventions Discussed/Reviewed General Interventions Reviewed, Communication with, Level of Care  [discussed continued need for PT to assist with transfer to wheelchair-pt will need face to face appt with provider inorder to receive [facility] orders-lifeline assistance programs received and will be reviewed]  Doctor Visits Discussed/Reviewed Doctor Visits Discussed, PCP  [PCP 09/03/23]  Level of Care Personal Care Services  [confirmed that patient continues to receive personal care services-son to plan to review household care needs with aid during her next visit to confirm expectations]       Follow up plan:  Follow up call scheduled for 08/22/23    Encounter Outcome:  Patient Visit Completed

## 2023-08-15 ENCOUNTER — Ambulatory Visit: Payer: Self-pay

## 2023-08-15 NOTE — Patient Instructions (Signed)
 Visit Information  Thank you for taking time to visit with me today. Please don't hesitate to contact me if I can be of assistance to you.   Following are the goals we discussed today:   Goals Addressed             This Visit's Progress    continued improvement post hospitalization/ management of health conditions and obtain community resource needs.       Interventions Today    Flowsheet Row Most Recent Value  Chronic Disease   Chronic disease during today's visit Diabetes, Chronic Obstructive Pulmonary Disease (COPD)  General Interventions   General Interventions Discussed/Reviewed General Interventions Reviewed, Doctor Visits  [evaluation of current treatment plan for DM/ COPD and patients adherence to plan as established by provider. Assessed for BS and COPD symptoms]  Doctor Visits Discussed/Reviewed Doctor Visits Reviewed  bethann upcoming provider visits. Advised to keep follow up visits with provider as recommended.]  Education Interventions   Education Provided Provided Education  [Advised to contact current primary care provider for any new or ongoing symptoms prior to transfer to new provider. Advised to contact this RNCM if needed prior to new RN case manager appointment.]  Provided Verbal Education On Other, Blood Sugar Monitoring  [Advised ongoing monitoring of blood sugar reporting frequent lows <70 or highs >250 to provider. Confirmed patient still receiving aide care.]  Pharmacy Interventions   Pharmacy Dicussed/Reviewed Pharmacy Topics Reviewed  [medications reviewed and compliance discussed.  Confirmed patient has all medications and is taking them as prescribed. Advised son to continue daily reminder calls to patient regarding medication.]  Safety Interventions   Safety Discussed/Reviewed Fall Risk  [assessed for falls.]                    You next appointment is by telephone on 09/11/23 at 3 pm with Odella Ku, RN case manager.   Please call the  care guide team at [phone] if you need to cancel or reschedule your appointment.   If you are experiencing a Mental Health or Behavioral Health Crisis or need someone to talk to, please call the Suicide and Crisis Lifeline: 988 call 1-800-273-TALK (toll free, 24 hour hotline)  Patient verbalizes understanding of instructions and care plan provided today and agrees to view in MyChart. Active MyChart status and patient understanding of how to access instructions and care plan via MyChart confirmed with patient.     Arvin Seip RN,BSN,CCM [place]  [facility] Institute, [facility] coordinator / Case Manager Phone: [phone]

## 2023-08-15 NOTE — Patient Outreach (Signed)
  Care Coordination   Follow Up Visit Note   08/15/2023 Name: Danny [name] Record Sr. MRN: [id number] DOB: Sep 24, 1950  Danny HERO Malak Sr. is a 73 y.o. year old male who sees Danny [name], Danny MATSU, MD for primary care. I  spoke with son Danny [name], Danny [name].   What matters to the patients health and wellness today?  Son states patient seems to be doing ok.  Son states patients blood sugars are more regulated. He states blood sugar range has been 100-130's with only two reported low blood sugars <70.   Son states patient checks his blood sugar several times a day with his CGM.  Son states patient is not exhibiting any increase signs of COPD.  He states patient continues to have aide assistance. Reports patient has all of his medications and takes them as prescribed.     Goals Addressed             This Visit's Progress    continued improvement post hospitalization/ management of health conditions and obtain community resource needs.       Interventions Today    Flowsheet Row Most Recent Value  Chronic Disease   Chronic disease during today's visit Diabetes, Chronic Obstructive Pulmonary Disease (COPD)  General Interventions   General Interventions Discussed/Reviewed General Interventions Reviewed, Doctor Visits  [evaluation of current treatment plan for DM/ COPD and patients adherence to plan as established by provider. Assessed for BS and COPD symptoms]  Doctor Visits Discussed/Reviewed Doctor Visits Reviewed  bethann upcoming provider visits. Advised to keep follow up visits with provider as recommended.]  Education Interventions   Education Provided Provided Education  [Advised to contact current primary care provider for any new or ongoing symptoms prior to transfer to new provider. Advised to contact this RNCM if needed prior to new RN case manager appointment.]  Provided Verbal Education On Other, Blood Sugar Monitoring  [Advised ongoing monitoring of blood sugar reporting frequent lows <70 or  highs >250 to provider. Confirmed patient still receiving aide care.]  Pharmacy Interventions   Pharmacy Dicussed/Reviewed Pharmacy Topics Reviewed  [medications reviewed and compliance discussed.  Confirmed patient has all medications and is taking them as prescribed. Advised son to continue daily reminder calls to patient regarding medication.]  Safety Interventions   Safety Discussed/Reviewed Fall Risk  [assessed for falls.]                    SDOH assessments and interventions completed:  No     Care Coordination Interventions:  Yes, provided   Follow up plan: Follow up call scheduled for 09/11/23 at 2 pm with new case manager [facility].     Encounter Outcome:  No Answer   Arvin Seip RN,BSN,CCM [place]  [facility] Institute, [facility] coordinator / Case Manager Phone: [phone]

## 2023-08-22 ENCOUNTER — Ambulatory Visit: Payer: Self-pay | Admitting: *Deleted

## 2023-08-22 NOTE — Patient Outreach (Addendum)
  Care Coordination   Follow Up Visit Note   08/22/2023 Name: Danny Kohlmeyer Feigenbaum Sr. MRN: [id number] DOB: 02/20/1951  Danny Friends Piechocki Sr. is a 73 y.o. year old male who sees Danny [name], Danny Mao, MD for primary care. I spoke with  Danny Scrape Sr. 's son by phone today.  What matters to the patients health and wellness today?  Continued follow up with in home aid, PT to assist with transfers, discounted phone service     Goals Addressed             This Visit's Progress    Patient's son requesting assistance options for transfers to wheelchair       Activities and task to complete in order to accomplish goals.    Patient to continue to received personal care services-24 hours per month, 2 hours per day 3 days per week  CSW has requested PT consult to assist with transfers,  son understands that face to face  visit is required before order can be written-appt scheduled with new provider on 09/03/23 Resources for affordable phone service provided-[employer] and [employer]-patient's son agreeable to follow up         SDOH assessments and interventions completed:  No     Care Coordination Interventions:  Yes, provided  Interventions Today    Flowsheet Row Most Recent Value  Chronic Disease   Chronic disease during today's visit Diabetes, Chronic Obstructive Pulmonary Disease (COPD)  General Interventions   General Interventions Discussed/Reviewed General Interventions Reviewed, Level of Care, Doctor Visits  Danny [name] for current community resource needs. Per patient's son patient now able to transfer from the bed to w/c-has been using chair now-still would like HH PT to work with him]  Doctor Visits Discussed/Reviewed Doctor Visits Discussed, PCP  [facility] 1/28 please discuss HH PT during visit Eye doctor 1/22]  Level of Care Personal Care Services  [confirmed that consistency with care aid has improved-2 hours 3 days per week]  Safety Interventions   Safety Discussed/Reviewed  Safety Reviewed, Fall Risk  [assessed for falls, son looking in to programs for low income phone services-resources previously provided]       Follow up plan: Follow up call scheduled for 09/06/23    Encounter Outcome:  Patient Visit Completed

## 2023-08-22 NOTE — Patient Instructions (Signed)
Visit Information  Thank you for taking time to visit with me today. Please don't hesitate to contact me if I can be of assistance to you.   Following are the goals we discussed today:   Goals Addressed             This Visit's Progress    Patient's son requesting assistance options for transfers to wheelchair       Activities and task to complete in order to accomplish goals.    Patient to continue to received personal care services-24 hours per month, 2 hours per day 3 days per week  CSW has requested PT consult to assist with transfers,  son understands that face to face  visit is required before order can be written-appt scheduled with new provider on 09/03/23 Resources for affordable phone service provided-[employer] and [employer]-patient's son agreeable to follow up         Our next appointment is by telephone on 09/06/23 at 1pm  Please call the care guide team at [phone] if you need to cancel or reschedule your appointment.   If you are experiencing a Mental Health or Behavioral Health Crisis or need someone to talk to, please call 911   Patient verbalizes understanding of instructions and care plan provided today and agrees to view in MyChart. Active MyChart status and patient understanding of how to access instructions and care plan via MyChart confirmed with patient.     Telephone follow up appointment with care management team member scheduled for: 09/06/23  Verna Czech, LCSW [place]  [facility]Based [facility] Institute, [facility] Health Licensed Clinical Social Worker Care Coordinator  Direct Dial: [phone]

## 2023-08-23 ENCOUNTER — Encounter: Payer: 59 | Admitting: *Deleted

## 2023-08-23 DIAGNOSIS — Z9181 History of falling: Secondary | ICD-10-CM | POA: Diagnosis not present

## 2023-08-23 DIAGNOSIS — Z89512 Acquired absence of left leg below knee: Secondary | ICD-10-CM | POA: Diagnosis not present

## 2023-08-23 DIAGNOSIS — J449 Chronic obstructive pulmonary disease, unspecified: Secondary | ICD-10-CM | POA: Diagnosis not present

## 2023-08-25 DIAGNOSIS — R531 Weakness: Secondary | ICD-10-CM | POA: Diagnosis not present

## 2023-08-26 ENCOUNTER — Other Ambulatory Visit: Payer: Self-pay | Admitting: Family Medicine

## 2023-08-26 DIAGNOSIS — I5032 Chronic diastolic (congestive) heart failure: Secondary | ICD-10-CM

## 2023-09-03 ENCOUNTER — Ambulatory Visit: Payer: Medicaid Other | Admitting: Internal Medicine

## 2023-09-06 ENCOUNTER — Ambulatory Visit: Payer: Self-pay | Admitting: *Deleted

## 2023-09-06 NOTE — Patient Outreach (Signed)
  Care Coordination   Follow Up Visit Note   09/06/2023 Name: Danny Bondar Chalfant Sr. MRN: [id number] DOB: [birth year]  Danny Friends Majer Sr. is a 73 y.o. year old male who sees [name], Yehuda Mao, MD for primary care. I spoke with  Danny Scrape Sr. son Danny [name] by phone today.  What matters to the patients health and wellness today?  Continued personal care services, PT to assist with transfers, follow up with PCP    Goals Addressed             This Visit's Progress    Patient's son requesting assistance options for transfers to wheelchair       Activities and task to complete in order to accomplish goals.    Patient to continue to received personal care services-24 hours per month, 2 hours per day 3 days per week  CSW has requested PT consult to assist with transfers,  son understands that face to face  visit is required before order can be written-appt with PCP to be re-scheduled  Resources for affordable phone service provided-[employer] and [employer]-patient's son agreeable to follow up Contact for Health Products Benefits provided [phone]         SDOH assessments and interventions completed:  No     Care Coordination Interventions:  Yes, provided  Interventions Today    Flowsheet Row Most Recent Value  Chronic Disease   Chronic disease during today's visit Diabetes, Chronic Obstructive Pulmonary Disease (COPD)  General Interventions   General Interventions Discussed/Reviewed General Interventions Reviewed, Doctor Visits  [Continued assessment of patient's community resource needs. Pt son confirms that pt has been incontinent- loose stools, encouraged follow up with provider, PT eval pending face to face visit with PCP]  Doctor Visits Discussed/Reviewed Doctor Visits Reviewed, PCP  [PCP appt missed 09/04/23-pt's son unable to take him due to illness-agreeable to re-schedule to discuss [facility] PT referral to assist with transfers from wheelchair to bed]  PCP/Specialist  Visits Compliance with follow-up visit  Level of Care Personal Care Services  [confirmed continued personal care services for 2 hours-3 days a week-quality of in home support has improved]  Education Interventions   Education Provided Provided Education  Provided Verbal Education On [employer] Benefits [phone]       Follow up plan: Follow up call scheduled for 09/23/23    Encounter Outcome:  Patient Visit Completed

## 2023-09-06 NOTE — Patient Instructions (Signed)
Visit Information  Thank you for taking time to visit with me today. Please don't hesitate to contact me if I can be of assistance to you.   Following are the goals we discussed today:   Goals Addressed             This Visit's Progress    Patient's son requesting assistance options for transfers to wheelchair       Activities and task to complete in order to accomplish goals.    Patient to continue to received personal care services-24 hours per month, 2 hours per day 3 days per week  CSW has requested PT consult to assist with transfers,  son understands that face to face  visit is required before order can be written-appt with PCP to be re-scheduled  Resources for affordable phone service provided-[employer] and [employer]-patient's son agreeable to follow up Contact for Health Products Benefits provided [phone]         Our next appointment is by telephone on 09/23/23 at 11am  Please call the care guide team at [phone] if you need to cancel or reschedule your appointment.   If you are experiencing a Mental Health or Behavioral Health Crisis or need someone to talk to, please call 911   Patient verbalizes understanding of instructions and care plan provided today and agrees to view in MyChart. Active MyChart status and patient understanding of how to access instructions and care plan via MyChart confirmed with patient.     Telephone follow up appointment with care management team member scheduled for: 09/23/23  Verna Czech, LCSW [place]  [facility], [facility] Licensed Clinical Social Worker Care Coordinator  Direct Dial: [phone]

## 2023-09-11 ENCOUNTER — Ambulatory Visit: Payer: Self-pay | Admitting: *Deleted

## 2023-09-11 NOTE — Patient Outreach (Signed)
  Care Coordination   09/11/2023 Name: Danny Coulthard Tuman Sr. MRN: [id number] DOB: 1950/09/06   Care Coordination Outreach Attempts:  An unsuccessful outreach was attempted for an appointment today.  Follow Up Plan:  Additional outreach attempts will be made to offer the patient complex care management information and services.   Encounter Outcome:  No Answer   Care Coordination Interventions:  No, not indicated    Odella Ku, RN, MSN, CCM [place]  [facility], [facility] RN Care Coordinator Direct Dial : [phone] / Main [phone] Fax [phone] Email: [email or web address] Website: [email or web address]

## 2023-09-16 ENCOUNTER — Telehealth: Payer: Self-pay | Admitting: *Deleted

## 2023-09-16 NOTE — Progress Notes (Signed)
Complex Care Management Care Guide Note  09/16/2023 Name: Danny Scheff Schoeneck Sr. MRN: [id number] DOB: [birth year]  Danny Friends Resler Sr. is a 73 y.o. year old male who is a primary care patient of [name], MD and is actively engaged with the care management team. I reached out to [employer] Sr. by phone today to assist with re-scheduling  with the RN Case Manager.  Follow up plan: Unsuccessful telephone outreach attempt made. A HIPAA compliant phone message was left for the patient providing contact information and requesting a return call.  [name], CMA, Care Guide [facility]  [facility], [facility] Guide Direct Dial: [phone]  Fax: [phone] Website: [email or web address]

## 2023-09-19 NOTE — Progress Notes (Signed)
Complex Care Management Care Guide Note  09/19/2023 Name: Danny Samek Demetriou Sr. MRN: [id number] DOB: 07/06/1951  Danny Friends Shams Sr. is a 73 y.o. year old male who is a primary care patient of [name], MD and is actively engaged with the care management team. I reached out to [employer] Sr. by phone today to assist with re-scheduling  with the RN Case Manager.  Follow up plan: unsuccessful telephone outreach attempt made. A HIPAA compliant phone message was left for the patient providing contact information and requesting a return call. No additional outreach attempts will be made due to inability to maintain patient contact.   [name], CMA, Care Guide [facility]  [facility], [facility] Guide Direct Dial: [phone]  Fax: [phone] Website: [email or web address]

## 2023-09-23 ENCOUNTER — Encounter: Payer: 59 | Admitting: *Deleted

## 2023-09-23 ENCOUNTER — Telehealth: Payer: Self-pay | Admitting: *Deleted

## 2023-09-23 DIAGNOSIS — J449 Chronic obstructive pulmonary disease, unspecified: Secondary | ICD-10-CM | POA: Diagnosis not present

## 2023-09-23 DIAGNOSIS — Z9181 History of falling: Secondary | ICD-10-CM | POA: Diagnosis not present

## 2023-09-23 DIAGNOSIS — Z89512 Acquired absence of left leg below knee: Secondary | ICD-10-CM | POA: Diagnosis not present

## 2023-09-23 NOTE — Patient Outreach (Signed)
  Care Coordination   09/23/2023 Name: Danny Meneely Cosey Sr. MRN: [id number] DOB: 1951-04-04   Care Coordination Outreach Attempts:  An unsuccessful telephone outreach was attempted today to offer the patient information about available complex care management services.  Follow Up Plan:  Additional outreach attempts will be made to offer the patient complex care management information and services.   Encounter Outcome:  No Answer   Care Coordination Interventions:  No, not indicated    Evyn Kooyman, LCSW [place]  [facility], [facility] Licensed Clinical Social Worker Care Coordinator  Direct Dial: [phone]

## 2023-09-25 DIAGNOSIS — R531 Weakness: Secondary | ICD-10-CM | POA: Diagnosis not present

## 2023-10-01 ENCOUNTER — Ambulatory Visit (INDEPENDENT_AMBULATORY_CARE_PROVIDER_SITE_OTHER): Payer: 59 | Admitting: Vascular Surgery

## 2023-10-01 ENCOUNTER — Encounter (INDEPENDENT_AMBULATORY_CARE_PROVIDER_SITE_OTHER): Payer: 59

## 2023-10-09 ENCOUNTER — Telehealth: Payer: Self-pay

## 2023-10-09 NOTE — Telephone Encounter (Signed)
 Pt needs to be scheduled a TOC

## 2023-10-10 ENCOUNTER — Telehealth: Payer: Self-pay | Admitting: Family Medicine

## 2023-10-10 NOTE — Telephone Encounter (Signed)
 Tried to call patient to schedule a TOC with Dr Charlann Lange. Both phone numbers do not have voice mail.

## 2023-10-21 DIAGNOSIS — Z89512 Acquired absence of left leg below knee: Secondary | ICD-10-CM | POA: Diagnosis not present

## 2023-10-21 DIAGNOSIS — Z9181 History of falling: Secondary | ICD-10-CM | POA: Diagnosis not present

## 2023-10-21 DIAGNOSIS — J449 Chronic obstructive pulmonary disease, unspecified: Secondary | ICD-10-CM | POA: Diagnosis not present

## 2023-10-23 DIAGNOSIS — R531 Weakness: Secondary | ICD-10-CM | POA: Diagnosis not present

## 2023-11-14 ENCOUNTER — Telehealth: Payer: Self-pay

## 2023-11-14 DIAGNOSIS — I5032 Chronic diastolic (congestive) heart failure: Secondary | ICD-10-CM

## 2023-11-14 MED ORDER — TAMSULOSIN HCL 0.4 MG PO CAPS: 0.4000 mg | ORAL_CAPSULE | Freq: Every day | ORAL | 0 refills | Status: DC

## 2023-11-14 NOTE — Telephone Encounter (Signed)
 Spoke to [place] from [facility], stated that pt needed a refill. 30 day supply has been sent til pt is seen by new pcp

## 2023-11-14 NOTE — Addendum Note (Signed)
 Addended by: Kristie Cowman on: 11/14/2023 04:27 PM   Modules accepted: Orders

## 2023-11-14 NOTE — Telephone Encounter (Signed)
 Copied from CRM [id number]. Topic: General - Other >> Nov 14, 2023 11:19 AM Aletta Edouard wrote: Reason for CRM: mandi from the pharamcy is needing a call back regarding this patient medication for tamsulosin (FLOMAX) 0.4 MG CAPS capsule cb number is [phone]

## 2023-11-21 DIAGNOSIS — Z9181 History of falling: Secondary | ICD-10-CM | POA: Diagnosis not present

## 2023-11-21 DIAGNOSIS — J449 Chronic obstructive pulmonary disease, unspecified: Secondary | ICD-10-CM | POA: Diagnosis not present

## 2023-11-21 DIAGNOSIS — Z89512 Acquired absence of left leg below knee: Secondary | ICD-10-CM | POA: Diagnosis not present

## 2023-11-23 DIAGNOSIS — R531 Weakness: Secondary | ICD-10-CM | POA: Diagnosis not present

## 2023-11-25 ENCOUNTER — Telehealth: Payer: Self-pay

## 2023-11-25 NOTE — Telephone Encounter (Signed)
 Called pt and lvm on daughter in laws phone. Wanted to reschedule pts appointment for earlier. Pt shouldve been scheduled for a time before 2pm per office manager. If pt or daughter in law calls back okay to relay and schedule

## 2023-11-27 ENCOUNTER — Telehealth: Payer: Self-pay

## 2023-11-27 ENCOUNTER — Encounter

## 2023-11-27 DIAGNOSIS — E1169 Type 2 diabetes mellitus with other specified complication: Secondary | ICD-10-CM

## 2023-11-27 DIAGNOSIS — I25118 Atherosclerotic heart disease of native coronary artery with other forms of angina pectoris: Secondary | ICD-10-CM

## 2023-11-27 DIAGNOSIS — I5032 Chronic diastolic (congestive) heart failure: Secondary | ICD-10-CM

## 2023-11-27 DIAGNOSIS — E785 Hyperlipidemia, unspecified: Secondary | ICD-10-CM

## 2023-11-27 NOTE — Telephone Encounter (Addendum)
 Several attempts made to reach pt. Called pts mobile phone, home phone, as well as the DPR number that is listed. Voicemail was left on the DPR number Danny [name]) who is the daughter in law.  Also reached out to the social worker Chrystal to see if there has been any other contact made with pt.   Will await call back from Chrystal as well.  Mychart message also sent

## 2023-12-18 ENCOUNTER — Other Ambulatory Visit (INDEPENDENT_AMBULATORY_CARE_PROVIDER_SITE_OTHER): Payer: Self-pay | Admitting: Vascular Surgery

## 2023-12-18 ENCOUNTER — Other Ambulatory Visit: Payer: Self-pay | Admitting: Internal Medicine

## 2023-12-18 DIAGNOSIS — I5032 Chronic diastolic (congestive) heart failure: Secondary | ICD-10-CM

## 2023-12-18 NOTE — Telephone Encounter (Signed)
 Pt scheduled to be seen tomorrow with Charan.

## 2023-12-19 ENCOUNTER — Telehealth: Payer: Self-pay

## 2023-12-19 ENCOUNTER — Ambulatory Visit: Admitting: Nurse Practitioner

## 2023-12-19 DIAGNOSIS — I25118 Atherosclerotic heart disease of native coronary artery with other forms of angina pectoris: Secondary | ICD-10-CM

## 2023-12-19 DIAGNOSIS — I5032 Chronic diastolic (congestive) heart failure: Secondary | ICD-10-CM

## 2023-12-19 DIAGNOSIS — E785 Hyperlipidemia, unspecified: Secondary | ICD-10-CM

## 2023-12-19 NOTE — Telephone Encounter (Signed)
 Officer Rogerio Clay from the [employer] called Clinical research associate 4:21p to let us  know that he has made contact with pt and that he was "fine."  Asked him to let pt know that he  needs to call office to make an appointment.

## 2023-12-19 NOTE — Telephone Encounter (Signed)
 See info below from pharmacist  Copied from CRM [id number]. Topic: Clinical - Prescription Issue >> Dec 19, 2023  2:13 PM Chuck Crater wrote: Reason for CRM: [facility] is calling to check on patient prescriptions. tamsulosin  (FLOMAX ) 0.4 MG CAPS capsule and amLODipine  (NORVASC ) 10 MG tablet. She states that Nurse Aide number is [phone]. Ethyl Hering said tat patient ios a compliance package patient and can't fill all of his medications without these prescriptions.

## 2023-12-19 NOTE — Telephone Encounter (Signed)
 Called and spoke with Odilia Bennett at [facility] police department to request a wellness check for pt.  She will dispatch a police officer to check on pt and call me back.

## 2023-12-19 NOTE — Telephone Encounter (Signed)
 Pt was scheduled to be seen today for a visit. Pt no showed. Pt is passed due for labs and refills. Spoke with pt's son yesterday who scheduled appt for pt. Tried to call son today to see why pt missed appt was unable to reach anyone.   Called pt's listed DPR Valentine Breaux, she stated that she was the Ex daughter in law and had not heard from pt or pt's son. She also stated that she was no longer living in [place]. Informed her that she was listed on pt's DPR and that we were calling to reach pt or pt's son. She stated that she would try to reach out to pt's son to have him call the office.   Tried calling listed numbers in pt's chart several times with no answer.  Pt has now missed several appointments, based off chart review pt did have a Child psychotherapist who also couldn't reach pt.   Please advise

## 2023-12-20 NOTE — Telephone Encounter (Signed)
 Called pt to inform him that he needs an in office appt to get refills but was unable to lvm because it has not been set up yet

## 2023-12-21 DIAGNOSIS — J449 Chronic obstructive pulmonary disease, unspecified: Secondary | ICD-10-CM | POA: Diagnosis not present

## 2023-12-21 DIAGNOSIS — Z9181 History of falling: Secondary | ICD-10-CM | POA: Diagnosis not present

## 2023-12-21 DIAGNOSIS — Z89512 Acquired absence of left leg below knee: Secondary | ICD-10-CM | POA: Diagnosis not present

## 2023-12-24 ENCOUNTER — Encounter (INDEPENDENT_AMBULATORY_CARE_PROVIDER_SITE_OTHER): Payer: Self-pay

## 2023-12-27 ENCOUNTER — Ambulatory Visit: Attending: Nurse Practitioner | Admitting: Physician Assistant

## 2023-12-27 ENCOUNTER — Encounter: Payer: Self-pay | Admitting: Nurse Practitioner

## 2023-12-27 VITALS — BP 110/62 | HR 79 | Ht 72.0 in

## 2023-12-27 DIAGNOSIS — I25118 Atherosclerotic heart disease of native coronary artery with other forms of angina pectoris: Secondary | ICD-10-CM | POA: Diagnosis not present

## 2023-12-27 DIAGNOSIS — E785 Hyperlipidemia, unspecified: Secondary | ICD-10-CM

## 2023-12-27 DIAGNOSIS — Z09 Encounter for follow-up examination after completed treatment for conditions other than malignant neoplasm: Secondary | ICD-10-CM

## 2023-12-27 DIAGNOSIS — I5042 Chronic combined systolic (congestive) and diastolic (congestive) heart failure: Secondary | ICD-10-CM

## 2023-12-27 DIAGNOSIS — I10 Essential (primary) hypertension: Secondary | ICD-10-CM

## 2023-12-27 MED ORDER — FUROSEMIDE 20 MG PO TABS: 20.0000 mg | ORAL_TABLET | Freq: Every day | ORAL | 3 refills | Status: AC | PRN

## 2023-12-27 NOTE — Patient Instructions (Signed)
 Medication Instructions:  TAKE: Furosemide  20 mg once daily as needed for swelling  *If you need a refill on your cardiac medications before your next appointment, please call your pharmacy*  Lab Work: None ordered If you have labs (blood work) drawn today and your tests are completely normal, you will receive your results only by: MyChart Message (if you have MyChart) OR A paper copy in the mail If you have any lab test that is abnormal or we need to change your treatment, we will call you to review the results.  Testing/Procedures: Your physician has requested that you have an echocardiogram. Echocardiography is a painless test that uses sound waves to create images of your heart. It provides your doctor with information about the size and shape of your heart and how well your heart's chambers and valves are working.   You may receive an ultrasound enhancing agent through an IV if needed to better visualize your heart during the echo. This procedure takes approximately one hour.  There are no restrictions for this procedure.  This will take place at [address] [facility]) [address], [place] [address]  Please note: We ask at that you not bring children with you during ultrasound (echo/ vascular) testing. Due to room size and safety concerns, children are not allowed in the ultrasound rooms during exams. Our front office staff cannot provide observation of children in our lobby area while testing is being conducted. An adult accompanying a patient to their appointment will only be allowed in the ultrasound room at the discretion of the ultrasound technician under special circumstances. We apologize for any inconvenience.   Follow-Up: At [facility], you and your health needs are our priority.  As part of our continuing mission to provide you with exceptional heart care, our providers are all part of one team.  This team includes your primary Cardiologist (physician)  and Advanced Practice Providers or APPs (Physician Assistants and Nurse Practitioners) who all work together to provide you with the care you need, when you need it.  Your next appointment:   3 month(s)  Provider:   You may see Timothy Gollan, MD or one of the following Advanced Practice Providers on your designated Care Team:   Laneta Pintos, NP Gildardo Labrador, PA-C Varney Gentleman, PA-C Cadence [place], PA-C Ronald Cockayne, NP Morey Ar, NP    We recommend signing up for the patient portal called "MyChart".  Sign up information is provided on this After Visit Summary.  MyChart is used to connect with patients for Virtual Visits (Telemedicine).  Patients are able to view lab/test results, encounter notes, upcoming appointments, etc.  Non-urgent messages can be sent to your provider as well.   To learn more about what you can do with MyChart, go to [email or web address].

## 2023-12-27 NOTE — Progress Notes (Signed)
 Cardiology Office Note    Date:  12/27/2023   ID:  Danny NODAL Sr., DOB 1950-10-25, MRN [id number]  PCP:  No primary care provider on file.  Cardiologist:  Timothy Gollan, MD  Electrophysiologist:  None   Chief Complaint: Follow up  History of Present Illness:   Danny KLARICH Sr. is a 73 y.o. male with history of CAD s/p LAD stenting, ICM, chronic combined CHF, CKD III, COPD, remote tobacco use, type 2 diabetes, hypertension, hyperlipidemia, erectile dysfunction, PVD/PAD, and obesity who is being seen for follow up on CAD, CHF, hypertension, and hyperlipidemia.    Patient is followed by Dr. Gollan for the above cardiac issues.  Has a history of CAD status post LAD stenting in 2011 with subsequent repeat stenting in 2006 in the setting of ISR.  It NSTEMI 07/2016 status post left heart cath with moderate to severe small vessel disease and patent stent.  Moderately elevated filling pressures were noted.  LVEF 25 to 30%.  Echo showed EF 38%.  Medical management was recommended.  He was subsequently placed on Ranexa  that was later titrated and then discontinued.  He has chronic stable dyspnea on exertion.  Follow-up echo showed improved EF 55 to 60%.   Patient also has a history of PVD/PAD previously followed by Dr. Vonna Guardian but was lost to follow-up.  2019 carotid study showed 1 to 39% bilateral right ICA stenosis.  Alvenia Aus is low in the left vertebral artery was noted to suggest possible obstruction.  Elevated velocities noted in the right vertebral artery.  Lower extremity arterial studies 2019 showed mild RLE arterial disease with a TBI that was abnormal.  Left toe brachial index was abnormal.  It was noted to waveforms at the ankle suggesting some component of arterial occlusive disease.  Recommended vascular surgery follow-up, but he did not see vascular at that time.  He did end up following up with vascular surgery for claudication symptoms.  He was seen 10/2020 and underwent angiogram and  stenting.    He returned to the hospital 11/2020 for worsening foot pain.  He was found to have a left toe gangrene with overlying cellulitis and underwent left BKA.  Cardiology was asked to consult during this hospitalization for preop risk evaluation.  Echo showed EF 35 to 40% with global hypokinesis, G1 DD, normal RV function and size, no significant valvular abnormalities, and RA pressure of 8 mmHg.  GDMT was titrated during admission.  It was recommended that he undergo outpatient ischemic evaluation for newly reduced EF.  However, it does not appear that he followed up after this admission.  Patient was admitted to [facility] 06/2022 with symptoms of shortness of breath and chest pain.  Overall workup was negative including EKG, troponin, D-dimer, and chest x-ray.  It was felt that chest discomfort was most likely GI related.  He was found to have AKI and Lasix , Aldactone , and Entresto  were held.   Patient was most recently seen by Dr. Gollan 08/07/2022 in follow-up after hospitalization. He was overall doing well from a cardiac perspective without symptoms of angina. GDMT included carvedilol , Jardiance , Imdur , and Lasix . No further testing or medication changes were made at that time.  Patient was seen in the emergency department 02/2023 for lower extremity edema.  He had no respiratory symptoms at that time.  Labs were unremarkable. Patient was felt to be in acute heart failure.  He was prescribed Lasix  20 mg daily for 1 week.  He was seen again in  the ED 4 days later for complaints of hyperglycemia and shortness of breath.  Troponin was negative.  This was felt to be pulmonary in origin.  He was prescribed albuterol  inhaler and discharged.  He was seen again in the ED 02/25/2023 for concerns of lower extremity edema.  Labs overall unremarkable including normal BNP.  X-ray close for right lower extremity without evidence of fractures.  Lower extremity ultrasound without evidence of DVT.  It was recommended that  patient be discharged with close follow-up with vascular surgery.  He was seen in follow-up with Dr. Vonna Guardian 04/2023 and lower extremity venous reflux studies were ordered.  This was done 07/02/2023 and had no evidence of DVT, superficial thrombophlebitis, or significant venous reflux in the right lower extremity.  It was recommended that he wear compression stockings, elevate legs, and exercise.  Today, patient reports overall doing well from a cardiac perspective. He does note that last week he had 1 episode of stabbing chest pain that lasted for about a minute before he decided to take sublingual nitroglycerin  and subsequently resolved.  This came on with rest.  Prior to this he had not had any episodes in at least year.  He is without symptoms of exertional angina cardiac decompensation.  He reports intermittent episodes of lower extremity swelling.  He reports mild exertional dyspnea, which he feels is at his baseline.  He appears euvolemic on exam today.  He sleeps in a bed with 1 pillow at night.  He denies lightheadedness, dizziness, orthopnea, PND, and bleeding.  Denies any recent falls.  Labs independently reviewed: 05/30/2023-NA 137, K4.1, BUN 36, CR 1.05, normal LFTs 05/19/2023-Hgb 15.8, HCT 48.7  Past Medical History:  Diagnosis Date   CAD (coronary artery disease)    a. 01/2010 PCI of LAD; b. 09/2014 PCI/DES of mLAD due to ISR. D1 80, D2 80(jailed), LCX 47m; c. 07/2016 NSTEMI/Cath: LM nl, LAD 71m ISR, 40d, D1 90ost, 80p, D2 90ost, RI min irregs, LCX min irregs, OM1 90 small, OM2/3 min irregs, RCA min irregs, RPLB1 90, EF 25-35%.   Chronic combined systolic and diastolic CHF (congestive heart failure) (HCC)    a. 07/2016 Echo: EF 30%, severe septal/anterior HK, Gr1 DD.   CKD (chronic kidney disease), stage III (HCC)    COPD (chronic obstructive pulmonary disease) (HCC)    DM2 (diabetes mellitus, type 2) (HCC)    Erectile dysfunction    HLD (hyperlipidemia)    HTN (hypertension)    Ischemic  cardiomyopathy    a. 07/2016 Echo: EF 30% w/ sev septal/ant HK. Gr1 DD.    Current Medications: Current Meds  Medication Sig   acetaminophen  (TYLENOL ) 325 MG tablet Take 2 tablets (650 mg total) by mouth every 4 (four) hours as needed for mild pain (or temp > 37.5 C (99.5 F)).   albuterol  (PROVENTIL ) (2.5 MG/3ML) 0.083% nebulizer solution Take 3 mLs (2.5 mg total) by nebulization every 6 (six) hours as needed for wheezing or shortness of breath.   albuterol  (VENTOLIN  HFA) 108 (90 Base) MCG/ACT inhaler Inhale 2 puffs into the lungs every 6 (six) hours as needed for wheezing or shortness of breath.   Albuterol  Sulfate (PROAIR  RESPICLICK) 108 (90 Base) MCG/ACT AEPB Inhale 1 puff into the lungs every 6 (six) hours as needed.   amLODipine  (NORVASC ) 10 MG tablet Take 1 tablet (10 mg total) by mouth daily.   aspirin  EC 81 MG tablet Take 1 tablet (81 mg total) by mouth daily.   atorvastatin  (LIPITOR ) 80 MG tablet  Take 1 tablet (80 mg total) by mouth daily.   carvedilol  (COREG ) 25 MG tablet Take 1 tablet (25 mg total) by mouth 2 (two) times daily with a meal.   clopidogrel  (PLAVIX ) 75 MG tablet TAKE 1 TABLET BY MOUTH ONCE DAILY   Continuous Glucose Receiver (FREESTYLE LIBRE 2 READER) DEVI 1 Device by Does not apply route 4 (four) times daily.   Continuous Glucose Sensor (FREESTYLE LIBRE 2 SENSOR) MISC Check blood sugars   empagliflozin  (JARDIANCE ) 25 MG TABS tablet Take 1 tablet (25 mg total) by mouth daily.   escitalopram  (LEXAPRO ) 10 MG tablet Take 1 tablet (10 mg total) by mouth daily.   furosemide  (LASIX ) 20 MG tablet Take 1 tablet (20 mg total) by mouth daily as needed for edema.   Insulin  Pen Needle (ULTRACARE PEN NEEDLES) 32G X 6 MM MISC USE AS DIRECTED. TWICE DAILY   nitroGLYCERIN  (NITROSTAT ) 0.4 MG SL tablet Place 0.4 mg under the tongue every 5 (five) minutes as needed for chest pain.   spironolactone  (ALDACTONE ) 25 MG tablet TAKE 1 TABLET BY MOUTH ONCE DAILY   tamsulosin  (FLOMAX ) 0.4 MG  CAPS capsule TAKE 1 CAPSULE BY MOUTH ONCE DAILY 30 MINUTES AFTER LARGEST MEAL.   tirzepatide  (MOUNJARO ) 7.5 MG/0.5ML Pen INJECT 7.5 MG SUBCUTANEOUSLY ONCE A WEEK   umeclidinium-vilanterol (ANORO ELLIPTA ) 62.5-25 MCG/ACT AEPB Inhale 1 puff into the lungs daily.    Allergies:   Metformin  and related   Social History   Socioeconomic History   Marital status: Divorced    Spouse name: Not on file   Number of children: Not on file   Years of education: Not on file   Highest education level: Not on file  Occupational History   Not on file  Tobacco Use   Smoking status: Former    Current packs/day: 0.00    Types: Cigarettes    Start date: 09/07/2009    Quit date: 09/07/2009    Years since quitting: 14.3   Smokeless tobacco: Never   Tobacco comments:    quit june 2011  Vaping Use   Vaping status: Never Used  Substance and Sexual Activity   Alcohol use: Not Currently    Alcohol/week: 7.0 standard drinks of alcohol    Types: 6 Cans of beer, 1 Standard drinks or equivalent per week    Comment: last week 07/26/2022   Drug use: Never   Sexual activity: Not Currently  Other Topics Concern   Not on file  Social History Narrative   divorced   Social Drivers of Health   Financial Resource Strain: Low Risk  (04/24/2023)   Overall Financial Resource Strain (CARDIA)    Difficulty of Paying Living Expenses: Not hard at all  Food Insecurity: No Food Insecurity (04/24/2023)   Hunger Vital Sign    Worried About Running Out of Food in the Last Year: Never true    Ran Out of Food in the Last Year: Never true  Transportation Needs: No Transportation Needs (04/24/2023)   PRAPARE - Administrator, Civil Service (Medical): No    Lack of Transportation (Non-Medical): No  Physical Activity: Inactive (04/24/2023)   Exercise Vital Sign    Days of Exercise per Week: 0 days    Minutes of Exercise per Session: 0 min  Stress: Stress Concern Present (04/24/2023)   [employer] of  Occupational Health - Occupational Stress Questionnaire    Feeling of Stress : To some extent  Social Connections: Socially Isolated (04/24/2023)   Social Connection  and Isolation Panel [NHANES]    Frequency of Communication with Friends and Family: More than three times a week    Frequency of Social Gatherings with Friends and Family: More than three times a week    Attends Religious Services: Never    Database administrator or Organizations: No    Attends Banker Meetings: Never    Marital Status: Divorced     Family History:  The patient's family history includes Cancer in his brother; Heart attack in his father.  ROS:   12-point review of systems is negative unless otherwise noted in the HPI.   EKGs/Labs/Other Studies Reviewed:    Studies reviewed were summarized above. The additional studies were reviewed today:  12/06/2020 Echo complete 1. Left ventricular ejection fraction, by estimation, is 35 to 40%. The  left ventricle has moderately decreased function. The left ventricle  demonstrates global hypokinesis. Left ventricular diastolic parameters are  indeterminate.   2. Right ventricular systolic function is normal. The right ventricular  size is normal.   3. The mitral valve was not well visualized. No evidence of mitral valve  regurgitation.   4. The aortic valve was not well visualized. Aortic valve regurgitation  is not visualized. No aortic stenosis is present.   5. The inferior vena cava is normal in size with greater than 50%  respiratory variability, suggesting right atrial pressure of 3 mmHg.   EKG:  EKG is ordered today.  The EKG ordered today demonstrates normal sinus rhythm, rate 81 bpm  Recent Labs: 02/25/2023: B Natriuretic Peptide 78.7 05/30/2023: ALT 31; BUN 36; Creatinine, Ser 1.05; Hemoglobin 15.5; Platelets 222; Potassium 4.1; Sodium 137  Recent Lipid Panel    Component Value Date/Time   CHOL 136 09/12/2022 1624   TRIG 103.0 09/12/2022  1624   HDL 55.70 09/12/2022 1624   CHOLHDL 2 09/12/2022 1624   VLDL 20.6 09/12/2022 1624   LDLCALC 59 09/12/2022 1624   LDLDIRECT 64.0 03/20/2018 0950    PHYSICAL EXAM:    VS:  BP 110/62   Pulse 79   Ht 6' (1.829 m)   SpO2 97%   BMI 33.50 kg/m   BMI: Body mass index is 33.5 kg/m.  Physical Exam Vitals and nursing note reviewed.  Constitutional:      Appearance: Normal appearance.  Cardiovascular:     Rate and Rhythm: Normal rate and regular rhythm.     Heart sounds: No murmur heard.    No gallop.  Pulmonary:     Effort: Pulmonary effort is normal.     Breath sounds: Normal breath sounds. No wheezing or rales.  Musculoskeletal:     Right lower leg: Edema (trace) present.     Comments: L BKA  Skin:    General: Skin is warm and dry.  Neurological:     General: No focal deficit present.     Mental Status: He is alert and oriented to person, place, and time. Mental status is at baseline.  Psychiatric:        Mood and Affect: Mood normal.        Behavior: Behavior normal.     Wt Readings from Last 3 Encounters:  05/30/23 247 lb (112 kg)  04/24/23 240 lb (108.9 kg)  04/19/23 235 lb (106.6 kg)     ASSESSMENT & PLAN:   Chronic combined systolic and diastolic heart failure - Most recent echo 12/2020 showed EF 35 to 40%, global hypokinesis, normal RV function and size, no significant valvular abnormalities.  Appears euvolemic on exam today.  He does endorse intermittent lower extremity edema and has been seen several times in the ED for this.  Given intermittent symptoms, will recheck echocardiogram.  I will also prescribe Lasix  20 mg once daily as needed for significant lower extremity edema.  He is continued on amlodipine , carvedilol , Jardiance , and spironolactone . If EF remains depressed on follow up echo, anticipate transitioning from amlodipine  to ARB or ARNI.   Coronary artery disease - Patient reported 1 episode of atypical chest pain lasting for less than 5 minutes,  resolved with sublingual nitroglycerin .  He is without symptoms of exertional angina or cardiac decompensation.  Recheck echo as above.  For recurrent symptoms, consider ischemic evaluation with Lexiscan  MPI.  Essential hypertension - Blood pressure well-controlled.  Continue antihypertensives as prescribed.  Hypercholesterolemia - Most recent lipid panel 09/2022 with LDL 59.  Continue statin.   Disposition: F/u with Dr. Gollan or an APP in 3 months or sooner if indicated by echocardiogram results.   Medication Adjustments/Labs and Tests Ordered: Current medicines are reviewed at length with the patient today.  Concerns regarding medicines are outlined above. Medication changes, Labs and Tests ordered today are summarized above and listed in the Patient Instructions accessible in Encounters.   Beather Liming, PA-C 12/27/2023 5:51 PM     [facility] [place] [address] [place], [place] [address] [phone]

## 2024-01-16 ENCOUNTER — Ambulatory Visit: Attending: Physician Assistant

## 2024-01-17 ENCOUNTER — Telehealth: Payer: Self-pay

## 2024-01-17 NOTE — Telephone Encounter (Signed)
 We received a fax regarding patient's prescription and I noticed that he does not have a TOC appointment scheduled.  I called patient's home number and the voicemail box has not been set up yet.  I called patient's mobile number and call cannot be completed as dialed.  I sent a message to patient via MyChart to find out if he would like to schedule a TOC appointment with us  or if he is planning to receive primary care elsewhere.

## 2024-01-17 NOTE — Telephone Encounter (Signed)
 Received refill request for Danny [name]. Pt has not been seen since 06/05/23 no showed last appt that was scheduled for 12/19/23

## 2024-01-21 DIAGNOSIS — Z89512 Acquired absence of left leg below knee: Secondary | ICD-10-CM | POA: Diagnosis not present

## 2024-01-21 DIAGNOSIS — Z9181 History of falling: Secondary | ICD-10-CM | POA: Diagnosis not present

## 2024-01-21 DIAGNOSIS — J449 Chronic obstructive pulmonary disease, unspecified: Secondary | ICD-10-CM | POA: Diagnosis not present

## 2024-01-23 ENCOUNTER — Other Ambulatory Visit: Payer: Self-pay | Admitting: Internal Medicine

## 2024-01-23 DIAGNOSIS — I5032 Chronic diastolic (congestive) heart failure: Secondary | ICD-10-CM

## 2024-01-24 ENCOUNTER — Other Ambulatory Visit: Payer: Self-pay | Admitting: Family Medicine

## 2024-01-24 ENCOUNTER — Telehealth: Payer: Self-pay

## 2024-01-24 NOTE — Telephone Encounter (Unsigned)
 Copied from CRM [id number]. Topic: Clinical - Medication Refill >> Jan 24, 2024 11:37 AM Chuck Crater wrote: Medication:  amLODipine  (NORVASC ) 10 MG tablet  Has the patient contacted their pharmacy? Yes (Agent: If no, request that the patient contact the pharmacy for the refill. If patient does not wish to contact the pharmacy document the reason why and proceed with request.) (Agent: If yes, when and what did the pharmacy advise?) Patient is a bubble pack patient so all meds go into one package This is the patient's preferred pharmacy:  [facility] - [place], [place] - [address] [place] [address] Phone: [phone] Fax: [phone]  Is this the correct pharmacy for this prescription? Yes If no, delete pharmacy and type the correct one.   Has the prescription been filled recently? Yes  Is the patient out of the medication? Yes  Has the patient been seen for an appointment in the last year OR does the patient have an upcoming appointment? Yes  Can we respond through MyChart? Yes  Agent: Please be advised that Rx refills may take up to 3 business days. We ask that you follow-up with your pharmacy.

## 2024-01-26 NOTE — Telephone Encounter (Signed)
 Pt has appt at [facility] on 6/23 to complete FL2 form. Pt still needs a TOC & medications refilled

## 2024-01-27 ENCOUNTER — Ambulatory Visit (INDEPENDENT_AMBULATORY_CARE_PROVIDER_SITE_OTHER): Admitting: Family Medicine

## 2024-01-27 ENCOUNTER — Encounter: Payer: Self-pay | Admitting: Family Medicine

## 2024-01-27 VITALS — BP 112/66 | HR 83 | Temp 98.1°F | Ht 72.0 in

## 2024-01-27 DIAGNOSIS — Z89512 Acquired absence of left leg below knee: Secondary | ICD-10-CM | POA: Diagnosis not present

## 2024-01-27 DIAGNOSIS — Z79899 Other long term (current) drug therapy: Secondary | ICD-10-CM

## 2024-01-27 DIAGNOSIS — Z7984 Long term (current) use of oral hypoglycemic drugs: Secondary | ICD-10-CM

## 2024-01-27 DIAGNOSIS — I5032 Chronic diastolic (congestive) heart failure: Secondary | ICD-10-CM

## 2024-01-27 DIAGNOSIS — E785 Hyperlipidemia, unspecified: Secondary | ICD-10-CM

## 2024-01-27 DIAGNOSIS — F419 Anxiety disorder, unspecified: Secondary | ICD-10-CM

## 2024-01-27 DIAGNOSIS — I10 Essential (primary) hypertension: Secondary | ICD-10-CM | POA: Diagnosis not present

## 2024-01-27 DIAGNOSIS — J439 Emphysema, unspecified: Secondary | ICD-10-CM | POA: Diagnosis not present

## 2024-01-27 DIAGNOSIS — Z8679 Personal history of other diseases of the circulatory system: Secondary | ICD-10-CM

## 2024-01-27 DIAGNOSIS — I2510 Atherosclerotic heart disease of native coronary artery without angina pectoris: Secondary | ICD-10-CM

## 2024-01-27 DIAGNOSIS — N183 Chronic kidney disease, stage 3 unspecified: Secondary | ICD-10-CM

## 2024-01-27 DIAGNOSIS — Z0279 Encounter for issue of other medical certificate: Secondary | ICD-10-CM

## 2024-01-27 DIAGNOSIS — E1122 Type 2 diabetes mellitus with diabetic chronic kidney disease: Secondary | ICD-10-CM

## 2024-01-27 DIAGNOSIS — I25118 Atherosclerotic heart disease of native coronary artery with other forms of angina pectoris: Secondary | ICD-10-CM

## 2024-01-27 DIAGNOSIS — E1142 Type 2 diabetes mellitus with diabetic polyneuropathy: Secondary | ICD-10-CM

## 2024-01-27 DIAGNOSIS — I70211 Atherosclerosis of native arteries of extremities with intermittent claudication, right leg: Secondary | ICD-10-CM

## 2024-01-27 DIAGNOSIS — I5020 Unspecified systolic (congestive) heart failure: Secondary | ICD-10-CM

## 2024-01-27 DIAGNOSIS — E1169 Type 2 diabetes mellitus with other specified complication: Secondary | ICD-10-CM | POA: Diagnosis not present

## 2024-01-27 DIAGNOSIS — F32A Depression, unspecified: Secondary | ICD-10-CM

## 2024-01-27 DIAGNOSIS — G546 Phantom limb syndrome with pain: Secondary | ICD-10-CM

## 2024-01-27 LAB — POCT GLYCOSYLATED HEMOGLOBIN (HGB A1C): Hemoglobin A1C: 8.2 % — AB (ref 4.0–5.6)

## 2024-01-27 MED ORDER — UMECLIDINIUM-VILANTEROL 62.5-25 MCG/ACT IN AEPB: 1.0000 | INHALATION_SPRAY | Freq: Every day | RESPIRATORY_TRACT | 3 refills | Status: AC

## 2024-01-27 MED ORDER — ESCITALOPRAM OXALATE 10 MG PO TABS: 10.0000 mg | ORAL_TABLET | Freq: Every day | ORAL | 0 refills | Status: AC

## 2024-01-27 MED ORDER — SPIRONOLACTONE 25 MG PO TABS: 25.0000 mg | ORAL_TABLET | Freq: Every day | ORAL | 0 refills | Status: AC

## 2024-01-27 MED ORDER — EMPAGLIFLOZIN 25 MG PO TABS: 25.0000 mg | ORAL_TABLET | Freq: Every day | ORAL | 0 refills | Status: AC

## 2024-01-27 MED ORDER — ATORVASTATIN CALCIUM 80 MG PO TABS: 80.0000 mg | ORAL_TABLET | Freq: Every day | ORAL | 0 refills | Status: AC

## 2024-01-27 MED ORDER — TAMSULOSIN HCL 0.4 MG PO CAPS: 0.4000 mg | ORAL_CAPSULE | Freq: Every day | ORAL | 0 refills | Status: DC

## 2024-01-27 MED ORDER — AMLODIPINE BESYLATE 10 MG PO TABS: 10.0000 mg | ORAL_TABLET | Freq: Every day | ORAL | 0 refills | Status: DC

## 2024-01-27 MED ORDER — CARVEDILOL 25 MG PO TABS: 25.0000 mg | ORAL_TABLET | Freq: Two times a day (BID) | ORAL | 0 refills | Status: AC

## 2024-01-27 NOTE — Progress Notes (Unsigned)
 Ph: [phone] Fax: [phone]   Patient ID: Danny CHRISTELLA Rummer Sr., male    DOB: November 14, 1950, 73 y.o.   MRN: [id number]  This visit was conducted in person.  BP 112/66   Pulse 83   Temp 98.1 F (36.7 C) (Oral)   Ht 6' (1.829 m)   SpO2 96%   BMI 33.50 kg/m    CC: requests FL2 form filled out Subjective:   HPI: Danny RUBENSTEIN Sr. is a 73 y.o. male presenting on 01/27/2024 for Form Completion (Needs FL 2 form completed. )   Octaviano New patient to me, previously saw Dr Maribeth, last seen 05/2023.   Currently living at home, planning to move - receiving assistance through social services but Danny [name]'s unsure who exactly is helping him. Son comes into visit too to help with history.   Danny [name]  has a past medical history of CAD (coronary artery disease), Cellulitis of left foot (11/09/2020), Chronic combined systolic and diastolic CHF (congestive heart failure) (HCC), CKD (chronic kidney disease), stage III (HCC), COPD (chronic obstructive pulmonary disease) (HCC), DM2 (diabetes mellitus, type 2) (HCC), Erectile dysfunction, HLD (hyperlipidemia), HTN (hypertension), and Ischemic cardiomyopathy.   Danny [name] also has PAD with L BKA amputation followed by [facility], last saw Dr Marea 06/2023. Danny [name] underwent extensive RLE revascularization early 2023. Danny [name] continues DAPT (plavix  and aspirin ) and atorvastatin  80mg  daily. Phantom pain managed with tylenol .   Danny [name] sees cardiology, last seen 12/27/2023. CAD managed with aspirin , plavix , atorvastatin . Updated echo scheduled for later this week.   COPD - managed with PRN albuterol . Danny [name] is not taking anoro ellipta . Denies dyspnea, cough, occasional wheezing.   Med rec - unsure if taking BP meds - did not bring in meds. Last filled 02/2023 with enough for 6 months. States Danny [name]'s taking fluid pill (?spironolactone ) about twice weekly.   DM - does regularly check sugars with CGM. Compliant with antihyperglycemic regimen which includes: mounjaro  7.5mg  weekly and jardiance   25mg  daily. Denies low sugars. Denies paresthesias, ++ blurry vision. Last diabetic eye exam OVERDUE - has cataracts. Glucometer brand: CGM Freestyle Libre 2. Last foot exam 02/2023. Has not yet had breakfast today. AM cbg 86 - given apple juice and crackers.  CGM data: Freestyle Libre 2 Didn't bring readings to review. Lab Results  Component Value Date   HGBA1C 8.2 (A) 01/27/2024   Diabetic Foot Exam - Simple   No data filed    No results found for: MICROALBUR, [id number]   Bowels - regular, no constipation, no incontinence Bladder - no incontinence Minimally ambulatory s/p L BKA, has prosthetic leg but hasn't used recently.      Relevant past medical, surgical, family and social history reviewed and updated as indicated. Interim medical history since our last visit reviewed. Allergies and medications reviewed and updated. Outpatient Medications Prior to Visit  Medication Sig Dispense Refill   acetaminophen  (TYLENOL ) 325 MG tablet Take 2 tablets (650 mg total) by mouth every 4 (four) hours as needed for mild pain (or temp > 37.5 C (99.5 F)).     albuterol  (PROVENTIL ) (2.5 MG/3ML) 0.083% nebulizer solution Take 3 mLs (2.5 mg total) by nebulization every 6 (six) hours as needed for wheezing or shortness of breath. 150 mL 1   albuterol  (VENTOLIN  HFA) 108 (90 Base) MCG/ACT inhaler Inhale 2 puffs into the lungs every 6 (six) hours as needed for wheezing or shortness of breath. 8 g 2   aspirin  EC 81 MG tablet Take 1 tablet (81 mg total) by  mouth daily. 90 tablet 3   clopidogrel  (PLAVIX ) 75 MG tablet TAKE 1 TABLET BY MOUTH ONCE DAILY 30 tablet 5   Continuous Glucose Receiver (FREESTYLE LIBRE 2 READER) DEVI 1 Device by Does not apply route 4 (four) times daily. 1 each 0   Continuous Glucose Sensor (FREESTYLE LIBRE 2 SENSOR) MISC Check blood sugars 6 each 3   furosemide  (LASIX ) 20 MG tablet Take 1 tablet (20 mg total) by mouth daily as needed for edema. 30 tablet 3   Insulin  Pen Needle  (ULTRACARE PEN NEEDLES) 32G X 6 MM MISC USE AS DIRECTED. TWICE DAILY 200 each 3   nitroGLYCERIN  (NITROSTAT ) 0.4 MG SL tablet Place 0.4 mg under the tongue every 5 (five) minutes as needed for chest pain.     tirzepatide  (MOUNJARO ) 7.5 MG/0.5ML Pen INJECT 7.5 MG SUBCUTANEOUSLY ONCE A WEEK 6 mL 1   Albuterol  Sulfate (PROAIR  RESPICLICK) 108 (90 Base) MCG/ACT AEPB Inhale 1 puff into the lungs every 6 (six) hours as needed. 1 each 2   amLODipine  (NORVASC ) 10 MG tablet Take 1 tablet (10 mg total) by mouth daily. 90 tablet 1   atorvastatin  (LIPITOR ) 80 MG tablet Take 1 tablet (80 mg total) by mouth daily. 90 tablet 1   carvedilol  (COREG ) 25 MG tablet Take 1 tablet (25 mg total) by mouth 2 (two) times daily with a meal. 180 tablet 1   empagliflozin  (JARDIANCE ) 25 MG TABS tablet Take 1 tablet (25 mg total) by mouth daily. 90 tablet 1   escitalopram  (LEXAPRO ) 10 MG tablet Take 1 tablet (10 mg total) by mouth daily. 90 tablet 1   spironolactone  (ALDACTONE ) 25 MG tablet TAKE 1 TABLET BY MOUTH ONCE DAILY 90 tablet 1   tamsulosin  (FLOMAX ) 0.4 MG CAPS capsule TAKE 1 CAPSULE BY MOUTH ONCE DAILY 30 MINUTES AFTER LARGEST MEAL. 30 capsule 0   umeclidinium-vilanterol (ANORO ELLIPTA ) 62.5-25 MCG/ACT AEPB Inhale 1 puff into the lungs daily. 3 each 11   No facility-administered medications prior to visit.     Per HPI unless specifically indicated in ROS section below Review of Systems  Objective:  BP 112/66   Pulse 83   Temp 98.1 F (36.7 C) (Oral)   Ht 6' (1.829 m)   SpO2 96%   BMI 33.50 kg/m   Wt Readings from Last 3 Encounters:  05/30/23 247 lb (112 kg)  04/24/23 240 lb (108.9 kg)  04/19/23 235 lb (106.6 kg)      Physical Exam Vitals and nursing note reviewed.  Constitutional:      Appearance: Normal appearance. Danny [name] is not ill-appearing.     Comments: Sitting in wheelchair  HENT:     Head: Normocephalic and atraumatic.     Mouth/Throat:     Mouth: Mucous membranes are moist.     Pharynx:  Oropharynx is clear. No oropharyngeal exudate or posterior oropharyngeal erythema.   Eyes:     Extraocular Movements: Extraocular movements intact.     Conjunctiva/sclera: Conjunctivae normal.     Pupils: Pupils are equal, round, and reactive to light.   Neck:     Thyroid : No thyroid  mass or thyromegaly.   Cardiovascular:     Rate and Rhythm: Normal rate and regular rhythm.     Pulses: Normal pulses.     Heart sounds: Normal heart sounds. No murmur heard. Pulmonary:     Effort: Pulmonary effort is normal. No respiratory distress.     Breath sounds: Normal breath sounds. No wheezing, rhonchi or rales.   Musculoskeletal:  General: No tenderness. Normal range of motion.     Cervical back: Normal range of motion.     Right lower leg: No edema.     Comments: S/p L BKA   Skin:    General: Skin is warm and dry.     Findings: No rash.   Neurological:     Mental Status: Danny [name] is alert.   Psychiatric:        Mood and Affect: Mood normal.        Behavior: Behavior normal.       Results for orders placed or performed in visit on 01/27/24  POCT glycosylated hemoglobin (Hb A1C)   Collection Time: 01/27/24  9:16 AM  Result Value Ref Range   Hemoglobin A1C 8.2 (A) 4.0 - 5.6 %   HbA1c POC (<> result, manual entry)     HbA1c, POC (prediabetic range)     HbA1c, POC (controlled diabetic range)    A1c 9.1 (05/2023)  Lab Results  Component Value Date   NA 137 05/30/2023   CL 103 05/30/2023   K 4.1 05/30/2023   CO2 26 05/30/2023   BUN 36 (H) 05/30/2023   CREATININE 1.05 05/30/2023   GFRNONAA >60 05/30/2023   CALCIUM  9.0 05/30/2023   PHOS 4.4 07/01/2022   ALBUMIN 4.1 05/30/2023   GLUCOSE 398 (H) 05/30/2023    Lab Results  Component Value Date   CHOL 136 09/12/2022   HDL 55.70 09/12/2022   LDLCALC 59 09/12/2022   LDLDIRECT 64.0 03/20/2018   TRIG 103.0 09/12/2022   CHOLHDL 2 09/12/2022    Lab Results  Component Value Date   ALT 31 05/30/2023   AST 18 05/30/2023    ALKPHOS 88 05/30/2023   BILITOT 0.5 05/30/2023    Lab Results  Component Value Date   WBC 7.7 05/30/2023   HGB 15.5 05/30/2023   HCT 48.1 05/30/2023   MCV 97.2 05/30/2023   PLT 222 05/30/2023   Assessment & Plan:   Problem List Items Addressed This Visit     Type 2 diabetes mellitus with hyperlipidemia (HCC) (Chronic)   Chronic on jardiance  25mg  daily and mounjaro  7.5mg  weekly.  A1c has decreased from 9s to 8s.  No changes made today.  Notes vision changes - overdue for eye exam - encouraged Danny [name] call to schedule this.  Continue freestyle libre 2 use - encouraged Danny [name] bring in reader to next appt to review data.      Relevant Medications   amLODipine  (NORVASC ) 10 MG tablet   atorvastatin  (LIPITOR ) 80 MG tablet   carvedilol  (COREG ) 25 MG tablet   empagliflozin  (JARDIANCE ) 25 MG TABS tablet   spironolactone  (ALDACTONE ) 25 MG tablet   Other Relevant Orders   POCT glycosylated hemoglobin (Hb A1C) (Completed)   Essential hypertension (Chronic)   Chronic, stable on current regimen - continue.       Relevant Medications   amLODipine  (NORVASC ) 10 MG tablet   atorvastatin  (LIPITOR ) 80 MG tablet   carvedilol  (COREG ) 25 MG tablet   spironolactone  (ALDACTONE ) 25 MG tablet   CAD (coronary artery disease) (Chronic)   Continue cardiology follow up - continue aspirin , plavix , statin.       Relevant Medications   amLODipine  (NORVASC ) 10 MG tablet   atorvastatin  (LIPITOR ) 80 MG tablet   carvedilol  (COREG ) 25 MG tablet   spironolactone  (ALDACTONE ) 25 MG tablet   Pulmonary emphysema (HCC) (Chronic)   Danny [name] states Danny [name]'s not taking anoro ellipta  - I have refilled this and encouraged daily controller  inhaler use. Continue rescue albuterol  inhaler PRN      Relevant Medications   umeclidinium-vilanterol (ANORO ELLIPTA ) 62.5-25 MCG/ACT AEPB   Hyperlipidemia   Continue high potency atorva 80mg       Relevant Medications   amLODipine  (NORVASC ) 10 MG tablet   atorvastatin  (LIPITOR ) 80 MG  tablet   carvedilol  (COREG ) 25 MG tablet   spironolactone  (ALDACTONE ) 25 MG tablet   Chronic diastolic CHF (congestive heart failure) (HCC)   Continue spironolactone , jardiance , carvedilol  with PRN furosemide       Relevant Medications   amLODipine  (NORVASC ) 10 MG tablet   atorvastatin  (LIPITOR ) 80 MG tablet   carvedilol  (COREG ) 25 MG tablet   spironolactone  (ALDACTONE ) 25 MG tablet   tamsulosin  (FLOMAX ) 0.4 MG CAPS capsule   CKD stage 3 due to type 2 diabetes mellitus (HCC)   H/o this however latest eGFR >60 (05/2023)      Relevant Medications   atorvastatin  (LIPITOR ) 80 MG tablet   empagliflozin  (JARDIANCE ) 25 MG TABS tablet   Diabetic neuropathy (HCC)   Relevant Medications   atorvastatin  (LIPITOR ) 80 MG tablet   empagliflozin  (JARDIANCE ) 25 MG TABS tablet   Anxiety and depression   Continue lexapro  10mg  daily.       Relevant Medications   escitalopram  (LEXAPRO ) 10 MG tablet   Atherosclerosis of native arteries of extremity with intermittent claudication (HCC)   Relevant Medications   amLODipine  (NORVASC ) 10 MG tablet   atorvastatin  (LIPITOR ) 80 MG tablet   carvedilol  (COREG ) 25 MG tablet   spironolactone  (ALDACTONE ) 25 MG tablet   HFrEF (heart failure with reduced ejection fraction) (HCC)   Relevant Medications   amLODipine  (NORVASC ) 10 MG tablet   atorvastatin  (LIPITOR ) 80 MG tablet   carvedilol  (COREG ) 25 MG tablet   spironolactone  (ALDACTONE ) 25 MG tablet   Hx of left BKA (HCC)   Largely non-ambulatory. In wheelchair today States Danny [name] has prosthetic leg but doesn't use it.       History of intracerebral hemorrhage without residual deficit   H/o this. Continue statin, tight BP control.       Medical certificate issuance - Primary   Here for FL2 form completion. Danny [name] is continent of bowel and bladder.  Danny [name] is largely non-ambulatory Danny [name] needs assistance with care ie bathing.  Recommend ALF/nursing home.  Form filled out. Discussed Danny [name] still needs to re  -establish care with new provider at his home office - [employer].       Polypharmacy   Danny [name] and son are unsure as to which meds Danny [name] takes FL2 filled out based on latest med list in chart. I asked them to verify this at home and to bring in all meds to their [facility] appt.  All meds refilled x90d while Danny [name] gets in for [facility] appt      Phantom pain after amputation of lower extremity (HCC)   Phantom pain managed with tylenol .         Meds ordered this encounter  Medications   amLODipine  (NORVASC ) 10 MG tablet    Sig: Take 1 tablet (10 mg total) by mouth daily.    Dispense:  90 tablet    Refill:  0   atorvastatin  (LIPITOR ) 80 MG tablet    Sig: Take 1 tablet (80 mg total) by mouth daily.    Dispense:  90 tablet    Refill:  0   carvedilol  (COREG ) 25 MG tablet    Sig: Take 1 tablet (25 mg total) by mouth 2 (two) times daily  with a meal.    Dispense:  180 tablet    Refill:  0   empagliflozin  (JARDIANCE ) 25 MG TABS tablet    Sig: Take 1 tablet (25 mg total) by mouth daily.    Dispense:  90 tablet    Refill:  0   escitalopram  (LEXAPRO ) 10 MG tablet    Sig: Take 1 tablet (10 mg total) by mouth daily.    Dispense:  90 tablet    Refill:  0   spironolactone  (ALDACTONE ) 25 MG tablet    Sig: Take 1 tablet (25 mg total) by mouth daily.    Dispense:  90 tablet    Refill:  0   tamsulosin  (FLOMAX ) 0.4 MG CAPS capsule    Sig: Take 1 capsule (0.4 mg total) by mouth daily.    Dispense:  90 capsule    Refill:  0   umeclidinium-vilanterol (ANORO ELLIPTA ) 62.5-25 MCG/ACT AEPB    Sig: Inhale 1 puff into the lungs daily.    Dispense:  60 each    Refill:  3    Orders Placed This Encounter  Procedures   POCT glycosylated hemoglobin (Hb A1C)    Patient Instructions  I will fill out FL2 forms. Schedule eye doctor appointment as you're due.  I have refilled Anoro ellipta  daily controller inhaler for COPD.  Schedule appointment to establish care with [facility].  Bring in all  medicines to your next appointment to establish care with one of new docs in [facility].  A1c today.   Follow up plan: Return if symptoms worsen or fail to improve.  Anton Blas, MD

## 2024-01-27 NOTE — Patient Instructions (Addendum)
 I will fill out FL2 forms. Schedule eye doctor appointment as you're due.  I have refilled Anoro ellipta  daily controller inhaler for COPD.  Schedule appointment to establish care with [facility].  Bring in all medicines to your next appointment to establish care with one of new docs in [facility]  A1c today.

## 2024-01-28 ENCOUNTER — Other Ambulatory Visit: Payer: Self-pay

## 2024-01-28 ENCOUNTER — Emergency Department
Admission: EM | Admit: 2024-01-28 | Discharge: 2024-01-29 | Disposition: A | Discharge status: Home / Self Care | Attending: Emergency Medicine | Admitting: Emergency Medicine

## 2024-01-28 ENCOUNTER — Encounter: Payer: Self-pay | Admitting: Family Medicine

## 2024-01-28 ENCOUNTER — Other Ambulatory Visit: Payer: Self-pay | Admitting: Family Medicine

## 2024-01-28 DIAGNOSIS — G546 Phantom limb syndrome with pain: Secondary | ICD-10-CM | POA: Insufficient documentation

## 2024-01-28 DIAGNOSIS — Z743 Need for continuous supervision: Secondary | ICD-10-CM | POA: Diagnosis not present

## 2024-01-28 DIAGNOSIS — R739 Hyperglycemia, unspecified: Secondary | ICD-10-CM

## 2024-01-28 DIAGNOSIS — Z0279 Encounter for issue of other medical certificate: Secondary | ICD-10-CM | POA: Insufficient documentation

## 2024-01-28 DIAGNOSIS — R0902 Hypoxemia: Secondary | ICD-10-CM | POA: Diagnosis not present

## 2024-01-28 DIAGNOSIS — E1165 Type 2 diabetes mellitus with hyperglycemia: Secondary | ICD-10-CM | POA: Diagnosis not present

## 2024-01-28 DIAGNOSIS — Z79899 Other long term (current) drug therapy: Secondary | ICD-10-CM | POA: Insufficient documentation

## 2024-01-28 DIAGNOSIS — N3000 Acute cystitis without hematuria: Secondary | ICD-10-CM | POA: Insufficient documentation

## 2024-01-28 DIAGNOSIS — I5032 Chronic diastolic (congestive) heart failure: Secondary | ICD-10-CM

## 2024-01-28 LAB — URINALYSIS, W/ REFLEX TO CULTURE (INFECTION SUSPECTED)
Bilirubin Urine: NEGATIVE
Glucose, UA: >=500 mg/dL — AB
Ketones, ur: NEGATIVE mg/dL
Nitrite: NEGATIVE
Protein, ur: NEGATIVE mg/dL
Specific Gravity, Urine: 1.021 (ref 1.005–1.030)
Squamous Epithelial / HPF: 0 /HPF (ref 0–5)
pH: 5 (ref 5.0–8.0)

## 2024-01-28 LAB — COMPREHENSIVE METABOLIC PANEL WITH GFR
ALT: 26 U/L (ref 0–44)
AST: 15 U/L (ref 15–41)
Albumin: 3.5 g/dL (ref 3.5–5.0)
Alkaline Phosphatase: 74 U/L (ref 38–126)
Anion gap: 9 (ref 5–15)
BUN: 40 mg/dL — ABNORMAL HIGH (ref 8–23)
CO2: 18 mmol/L — ABNORMAL LOW (ref 22–32)
Calcium: 8.7 mg/dL — ABNORMAL LOW (ref 8.9–10.3)
Chloride: 107 mmol/L (ref 98–111)
Creatinine, Ser: 1.28 mg/dL — ABNORMAL HIGH (ref 0.61–1.24)
GFR, Estimated: 59 mL/min — ABNORMAL LOW (ref >60)
Glucose, Bld: 179 mg/dL — ABNORMAL HIGH (ref 70–99)
Potassium: 4 mmol/L (ref 3.5–5.1)
Sodium: 134 mmol/L — ABNORMAL LOW (ref 135–145)
Total Bilirubin: 0.6 mg/dL (ref 0.0–1.2)
Total Protein: 6.5 g/dL (ref 6.5–8.1)

## 2024-01-28 LAB — CBC
HCT: 46.8 % (ref 39.0–52.0)
Hemoglobin: 15.1 g/dL (ref 13.0–17.0)
MCH: 30.5 pg (ref 26.0–34.0)
MCHC: 32.3 g/dL (ref 30.0–36.0)
MCV: 94.5 fL (ref 80.0–100.0)
Platelets: 207 10*3/uL (ref 150–400)
RBC: 4.95 MIL/uL (ref 4.22–5.81)
RDW: 12.6 % (ref 11.5–15.5)
WBC: 8.6 10*3/uL (ref 4.0–10.5)
nRBC: 0 % (ref 0.0–0.2)

## 2024-01-28 LAB — CBG MONITORING, ED
Glucose-Capillary: 239 mg/dL — ABNORMAL HIGH (ref 70–99)
Glucose-Capillary: 128 mg/dL — ABNORMAL HIGH (ref 70–99)

## 2024-01-28 MED ORDER — CEPHALEXIN 500 MG PO CAPS: 500.0000 mg | ORAL_CAPSULE | Freq: Two times a day (BID) | ORAL | 0 refills | Status: AC

## 2024-01-28 MED ORDER — SODIUM CHLORIDE 0.9 % IV SOLN
2.0000 g | Freq: Once | INTRAVENOUS | Status: AC
  Administered 2024-01-28: 2 g via INTRAVENOUS
  Filled 2024-01-28: qty 20

## 2024-01-28 MED ORDER — ACETAMINOPHEN 500 MG PO TABS
1000.0000 mg | ORAL_TABLET | Freq: Once | ORAL | Status: AC
  Administered 2024-01-28: 1000 mg via ORAL
  Filled 2024-01-28: qty 2

## 2024-01-28 MED ORDER — SODIUM CHLORIDE 0.9 % IV BOLUS
1000.0000 mL | Freq: Once | INTRAVENOUS | Status: AC
  Administered 2024-01-28: 1000 mL via INTRAVENOUS

## 2024-01-28 NOTE — Assessment & Plan Note (Signed)
 Phantom pain managed with tylenol .

## 2024-01-28 NOTE — Assessment & Plan Note (Signed)
 He states he's not taking anoro ellipta  - I have refilled this and encouraged daily controller inhaler use. Continue rescue albuterol  inhaler PRN

## 2024-01-28 NOTE — Assessment & Plan Note (Addendum)
 He and son are unsure as to which meds he takes FL2 filled out based on latest med list in chart. I asked them to verify this at home and to bring in all meds to their [facility] appt.  All meds refilled x90d while he gets in for [facility] appt

## 2024-01-28 NOTE — Assessment & Plan Note (Signed)
 Chronic, stable on current regimen - continue.

## 2024-01-28 NOTE — Assessment & Plan Note (Signed)
 Largely non-ambulatory. In wheelchair today States he has prosthetic leg but doesn't use it.

## 2024-01-28 NOTE — Assessment & Plan Note (Signed)
 H/o this however latest eGFR >60 (05/2023)

## 2024-01-28 NOTE — ED Notes (Signed)
 [facility] called to transport patient to home spoke with Holley

## 2024-01-28 NOTE — ED Triage Notes (Addendum)
 EMS called out to house by neighbors. Helped him check his decom and read 310/ EMS recheck 396. Otherwise asymptomatic. States he has been eating and drinking as normal with some sweets and soda here and there to nibble on. Denies N/V

## 2024-01-28 NOTE — Assessment & Plan Note (Signed)
 Continue cardiology follow up - continue aspirin , plavix , statin.

## 2024-01-28 NOTE — Discharge Instructions (Signed)
 You are seen in the emergency department for elevated blood glucose.  You are given IV fluids.  Urine findings concerning for possible urinary tract infection so you are given a dose of antibiotics.  You are given a prescription for antibiotics, take as prescribed.  Stay hydrated and drink plenty fluids.  Watch your carbohydrates.  Follow-up closely with your primary care physician.  Urine was sent for culture, you will be notified if you need a different antibiotic.  Follow-up closely with your primary care physician and return to the emergency department for any worsening symptoms.

## 2024-01-28 NOTE — Assessment & Plan Note (Addendum)
 Chronic on jardiance  25mg  daily and mounjaro  7.5mg  weekly.  A1c has decreased from 9s to 8s.  No changes made today.  Notes vision changes - overdue for eye exam - encouraged he call to schedule this.  Continue freestyle libre 2 use - encouraged he bring in reader to next appt to review data.

## 2024-01-28 NOTE — ED Provider Notes (Signed)
 [facility] Provider Note    Event Date/Time   First MD Initiated Contact with Patient 01/28/24 1920     (approximate)   History   Hyperglycemia (Called out to house by neighbors. Helped him check his decom and read 310/ EMS recheck 396. Otherwise asymptomatic. States he has been eating and drinking with some sweets and soda here and there to nibble on. Denies N/V)   HPI  DIONYSIOS MASSMAN Sr. is a 73 y.o. male past medical history significant for diabetes, who presents to the emergency department with hyperglycemia.  Patient states that he has been taking his home medication.  States that his glucose was elevated today.  He called out of the house by his neighbor to help him check his Dexcom.  Patient states that he otherwise feels okay.  Does endorse some burning with urination and urinary urgency or frequency.  States that he is otherwise feeling well better with no nausea or vomiting.     Physical Exam   Triage Vital Signs: ED Triage Vitals  Encounter Vitals Group     BP 01/28/24 1922 (!) 135/98     Girls Systolic BP Percentile --      Girls Diastolic BP Percentile --      Boys Systolic BP Percentile --      Boys Diastolic BP Percentile --      Pulse Rate 01/28/24 1922 87     Resp 01/28/24 1922 20     Temp 01/28/24 1923 98.1 F (36.7 C)     Temp Source 01/28/24 1922 Oral     SpO2 01/28/24 1922 96 %     Weight --      Height --      Head Circumference --      Peak Flow --      Pain Score 01/28/24 1920 0     Pain Loc --      Pain Education --      Exclude from Growth Chart --     Most recent vital signs: Vitals:   01/28/24 2306 01/29/24 0000  BP:  124/75  Pulse: 77   Resp: 20   Temp: 98.2 F (36.8 C)   SpO2: 95%     Physical Exam Constitutional:      Appearance: He is well-developed.  HENT:     Head: Atraumatic.   Eyes:     Conjunctiva/sclera: Conjunctivae normal.    Cardiovascular:     Rate and Rhythm: Regular rhythm.   Pulmonary:     Effort: No respiratory distress.   Musculoskeletal:     Cervical back: Normal range of motion.   Skin:    General: Skin is warm.   Neurological:     Mental Status: He is alert. Mental status is at baseline.     IMPRESSION / MDM / ASSESSMENT AND PLAN / ED COURSE  I reviewed the triage vital signs and the nursing notes.  Differential diagnosis including hypoglycemia, DKA, dehydration, electrolyte abnormality, urinary tract infection   LABS (all labs ordered are listed, but only abnormal results are displayed) Labs interpreted as -    Labs Reviewed  COMPREHENSIVE METABOLIC PANEL WITH GFR - Abnormal; Notable for the following components:      Result Value   Sodium 134 (*)    CO2 18 (*)    Glucose, Bld 179 (*)    BUN 40 (*)    Creatinine, Ser 1.28 (*)    Calcium  8.7 (*)  GFR, Estimated 59 (*)    All other components within normal limits  URINALYSIS, W/ REFLEX TO CULTURE (INFECTION SUSPECTED) - Abnormal; Notable for the following components:   Color, Urine STRAW (*)    APPearance HAZY (*)    Glucose, UA >=500 (*)    Hgb urine dipstick SMALL (*)    Leukocytes,Ua SMALL (*)    Bacteria, UA RARE (*)    All other components within normal limits  CBG MONITORING, ED - Abnormal; Notable for the following components:   Glucose-Capillary 239 (*)    All other components within normal limits  CBG MONITORING, ED - Abnormal; Notable for the following components:   Glucose-Capillary 128 (*)    All other components within normal limits  URINE CULTURE  CBC     MDM  Patient was given IV fluids.  Hyperglycemia but no concern for DKA.  BUN chronically elevated.  Creatinine mildly elevated at 1.2.  No significant leukocytosis or anemia.  Urine appears concern for possible urinary tract infection and patient does have symptoms.  Will give IV Rocephin  and send for urine culture.  Repeat glucose significantly improved in the 120s.  Will start on Keflex .  Discussed  follow-up with primary care physician and return precautions for any worsening symptoms.  No questions at time of discharge.     PROCEDURES:  Critical Care performed: No  Procedures  Patient's presentation is most consistent with acute complicated illness / injury requiring diagnostic workup.   MEDICATIONS ORDERED IN ED: Medications  cefTRIAXone  (ROCEPHIN ) 2 g in sodium chloride  0.9 % 100 mL IVPB (0 g Intravenous Stopped 01/28/24 2216)  sodium chloride  0.9 % bolus 1,000 mL (0 mLs Intravenous Stopped 01/28/24 2216)  acetaminophen  (TYLENOL ) tablet 1,000 mg (1,000 mg Oral Given 01/28/24 2216)    FINAL CLINICAL IMPRESSION(S) / ED DIAGNOSES   Final diagnoses:  Hyperglycemia  Acute cystitis without hematuria     Rx / DC Orders   ED Discharge Orders          Ordered    cephALEXin  (KEFLEX ) 500 MG capsule  2 times daily        01/28/24 2234             Note:  This document was prepared using Dragon voice recognition software and may include unintentional dictation errors.   Suzanne Kirsch, MD 01/29/24 [phone]

## 2024-01-28 NOTE — Assessment & Plan Note (Signed)
Continue lexapro 10mg  daily.

## 2024-01-28 NOTE — Assessment & Plan Note (Signed)
 Continue high potency atorva 80mg 

## 2024-01-28 NOTE — Assessment & Plan Note (Signed)
 Continue spironolactone , jardiance , carvedilol  with PRN furosemide 

## 2024-01-28 NOTE — Assessment & Plan Note (Signed)
 Here for FL2 form completion. He is continent of bowel and bladder.  He is largely non-ambulatory He needs assistance with care ie bathing.  Recommend ALF/nursing home.  Form filled out. Discussed he still needs to re -establish care with new provider at his home office - [employer].

## 2024-01-28 NOTE — Telephone Encounter (Signed)
 ERx

## 2024-01-28 NOTE — Assessment & Plan Note (Signed)
 H/o this. Continue statin, tight BP control.

## 2024-01-28 NOTE — Assessment & Plan Note (Deleted)
 Continue cardiology follow up - continue aspirin , plavix , statin.

## 2024-01-29 NOTE — ED Notes (Signed)
 [facility] has arrived to transport patient home

## 2024-01-29 NOTE — Telephone Encounter (Signed)
 My chart message regarding appointment.

## 2024-01-30 ENCOUNTER — Telehealth: Payer: Self-pay | Admitting: General Practice

## 2024-01-30 LAB — URINE CULTURE: Culture: 60000 — AB

## 2024-01-30 NOTE — Telephone Encounter (Signed)
 Copied from CRM [id number]. Topic: General - Other >> Jan 30, 2024  8:55 AM Treva T wrote: Reason for CRM: Karna Moats, care taker, along with patient calling regarding FL2 form for home placement, as patient states he will be out of a home.  Form needs to be completed as soon as possible.  Per patient needs to be emailed to: [email or web address]  Please contact patient with a phone call to discuss further. Patient can be reached at [phone]

## 2024-01-30 NOTE — Telephone Encounter (Signed)
 Dr KANDICE, didn't we give the pt the FL2 form at his visit?

## 2024-01-30 NOTE — Telephone Encounter (Signed)
 This was given to pt at office visit. It is also in chart under media section.

## 2024-01-31 ENCOUNTER — Ambulatory Visit

## 2024-01-31 NOTE — Telephone Encounter (Signed)
Lvm asking pt to call back.  Need to relay Dr. G's message.  

## 2024-02-03 NOTE — Telephone Encounter (Signed)
Lvm asking pt to call back.  Need to relay Dr. G's message.  

## 2024-02-04 NOTE — Telephone Encounter (Signed)
Lvm asking pt to call back.  Need to relay Dr. G's message.   Mailing a letter.  

## 2024-02-11 ENCOUNTER — Encounter: Payer: Self-pay | Admitting: *Deleted

## 2024-02-11 ENCOUNTER — Emergency Department
Admission: EM | Admit: 2024-02-11 | Discharge: 2024-02-12 | Disposition: A | Discharge status: Home / Self Care | Attending: Emergency Medicine | Admitting: Emergency Medicine

## 2024-02-11 ENCOUNTER — Other Ambulatory Visit: Payer: Self-pay | Admitting: Family Medicine

## 2024-02-11 ENCOUNTER — Other Ambulatory Visit: Payer: Self-pay

## 2024-02-11 DIAGNOSIS — R42 Dizziness and giddiness: Secondary | ICD-10-CM | POA: Diagnosis not present

## 2024-02-11 DIAGNOSIS — I2510 Atherosclerotic heart disease of native coronary artery without angina pectoris: Secondary | ICD-10-CM | POA: Insufficient documentation

## 2024-02-11 DIAGNOSIS — I10 Essential (primary) hypertension: Secondary | ICD-10-CM | POA: Diagnosis not present

## 2024-02-11 DIAGNOSIS — R739 Hyperglycemia, unspecified: Secondary | ICD-10-CM | POA: Diagnosis not present

## 2024-02-11 DIAGNOSIS — E1165 Type 2 diabetes mellitus with hyperglycemia: Secondary | ICD-10-CM | POA: Insufficient documentation

## 2024-02-11 DIAGNOSIS — E1169 Type 2 diabetes mellitus with other specified complication: Secondary | ICD-10-CM

## 2024-02-11 DIAGNOSIS — H538 Other visual disturbances: Secondary | ICD-10-CM | POA: Diagnosis not present

## 2024-02-11 LAB — URINALYSIS, ROUTINE W REFLEX MICROSCOPIC
Bacteria, UA: NONE SEEN
Bilirubin Urine: NEGATIVE
Glucose, UA: >=500 mg/dL — AB
Hgb urine dipstick: NEGATIVE
Ketones, ur: NEGATIVE mg/dL
Leukocytes,Ua: NEGATIVE
Nitrite: NEGATIVE
Protein, ur: NEGATIVE mg/dL
Specific Gravity, Urine: 1.025 (ref 1.005–1.030)
pH: 5 (ref 5.0–8.0)

## 2024-02-11 LAB — CBC
HCT: 47.1 % (ref 39.0–52.0)
Hemoglobin: 15.3 g/dL (ref 13.0–17.0)
MCH: 30.9 pg (ref 26.0–34.0)
MCHC: 32.5 g/dL (ref 30.0–36.0)
MCV: 95.2 fL (ref 80.0–100.0)
Platelets: 225 K/uL (ref 150–400)
RBC: 4.95 MIL/uL (ref 4.22–5.81)
RDW: 12.9 % (ref 11.5–15.5)
WBC: 7.8 K/uL (ref 4.0–10.5)
nRBC: 0 % (ref 0.0–0.2)

## 2024-02-11 LAB — BASIC METABOLIC PANEL WITH GFR
Anion gap: 10 (ref 5–15)
BUN: 36 mg/dL — ABNORMAL HIGH (ref 8–23)
CO2: 23 mmol/L (ref 22–32)
Calcium: 9.5 mg/dL (ref 8.9–10.3)
Chloride: 105 mmol/L (ref 98–111)
Creatinine, Ser: 1.37 mg/dL — ABNORMAL HIGH (ref 0.61–1.24)
GFR, Estimated: 54 mL/min — ABNORMAL LOW (ref >60)
Glucose, Bld: 217 mg/dL — ABNORMAL HIGH (ref 70–99)
Potassium: 4 mmol/L (ref 3.5–5.1)
Sodium: 138 mmol/L (ref 135–145)

## 2024-02-11 LAB — BETA-HYDROXYBUTYRIC ACID: Beta-Hydroxybutyric Acid: 0.09 mmol/L (ref 0.05–0.27)

## 2024-02-11 LAB — CBG MONITORING, ED: Glucose-Capillary: 224 mg/dL — ABNORMAL HIGH (ref 70–99)

## 2024-02-11 MED ORDER — MOUNJARO 7.5 MG/0.5ML ~~LOC~~ SOAJ: 7.5000 mg | SUBCUTANEOUS | 0 refills | Status: DC

## 2024-02-11 NOTE — Telephone Encounter (Unsigned)
 Copied from CRM [id number]. Topic: Clinical - Medication Refill >> Feb 11, 2024  3:20 PM [place] P wrote: Medication: tirzepatide  (MOUNJARO ) 7.5 MG/0.5ML Pen  Has the patient contacted their pharmacy? Yes (Agent: If no, request that the patient contact the pharmacy for the refill. If patient does not wish to contact the pharmacy document the reason why and proceed with request.) (Agent: If yes, when and what did the pharmacy advise?)  This is the patient's preferred pharmacy:  [facility] - [place], [place] - [address] [place] [address] Phone: [phone] Fax: [phone]  Is this the correct pharmacy for this prescription? Yes If no, delete pharmacy and type the correct one.   Has the prescription been filled recently? No  Is the patient out of the medication? Yes  Has the patient been seen for an appointment in the last year OR does the patient have an upcoming appointment? Yes  Can we respond through MyChart? Yes  Agent: Please be advised that Rx refills may take up to 3 business days. We ask that you follow-up with your pharmacy.

## 2024-02-11 NOTE — Discharge Instructions (Signed)
 I have sent your Mounjaro  prescription to your pharmacy.  Please pick this up tomorrow and continue to take as prescribed.

## 2024-02-11 NOTE — ED Provider Notes (Signed)
 [facility] Provider Note    Event Date/Time   First MD Initiated Contact with Patient 02/11/24 2156     (approximate)   History   Hyperglycemia   HPI Danny KOLENDA Sr. is a 73 y.o. male with history of DM 2, HTN, CAD presenting today for hyperglycemia.  Patient was brought in from home for concerns of elevated blood sugar.  He states he has been out of his Mounjaro  for couple weeks.  Denies any pain symptoms or any other acute symptoms associated with this.  Only asking for refills of Mounjaro .  He lost his old PCP who retired and is in the process of getting a new one.  Has all his other medications.     Physical Exam   Triage Vital Signs: ED Triage Vitals  Encounter Vitals Group     BP 02/11/24 1933 130/69     Girls Systolic BP Percentile --      Girls Diastolic BP Percentile --      Boys Systolic BP Percentile --      Boys Diastolic BP Percentile --      Pulse Rate 02/11/24 1933 84     Resp 02/11/24 1933 18     Temp 02/11/24 1933 98.5 F (36.9 C)     Temp Source 02/11/24 1933 Oral     SpO2 02/11/24 1933 95 %     Weight 02/11/24 1932 246 lb 14.6 oz (112 kg)     Height 02/11/24 1932 6' (1.829 m)     Head Circumference --      Peak Flow --      Pain Score 02/11/24 1932 0     Pain Loc --      Pain Education --      Exclude from Growth Chart --     Most recent vital signs: Vitals:   02/11/24 1933  BP: 130/69  Pulse: 84  Resp: 18  Temp: 98.5 F (36.9 C)  SpO2: 95%   I have reviewed the vital signs. General:  Awake, alert, no acute distress. Head:  Normocephalic, Atraumatic. EENT:  PERRL, EOMI, Oral mucosa pink and moist, Neck is supple. Cardiovascular: Regular rate, 2+ distal pulses. Respiratory:  Normal respiratory effort, symmetrical expansion, no distress.   Extremities:  Moving all four extremities through full ROM without pain.   Neuro:  Alert and oriented.  Interacting appropriately.   Skin:  Warm, dry, no rash.   Psych:  Appropriate affect.    ED Results / Procedures / Treatments   Labs (all labs ordered are listed, but only abnormal results are displayed) Labs Reviewed  BASIC METABOLIC PANEL WITH GFR - Abnormal; Notable for the following components:      Result Value   Glucose, Bld 217 (*)    BUN 36 (*)    Creatinine, Ser 1.37 (*)    GFR, Estimated 54 (*)    All other components within normal limits  URINALYSIS, ROUTINE W REFLEX MICROSCOPIC - Abnormal; Notable for the following components:   Color, Urine STRAW (*)    APPearance CLEAR (*)    Glucose, UA >=500 (*)    All other components within normal limits  CBG MONITORING, ED - Abnormal; Notable for the following components:   Glucose-Capillary 224 (*)    All other components within normal limits  CBC  BETA-HYDROXYBUTYRIC ACID  CBG MONITORING, ED  CBG MONITORING, ED     EKG    RADIOLOGY    PROCEDURES:  Critical Care  performed: No  Procedures   MEDICATIONS ORDERED IN ED: Medications - No data to display   IMPRESSION / MDM / ASSESSMENT AND PLAN / ED COURSE  I reviewed the triage vital signs and the nursing notes.                              Differential diagnosis includes, but is not limited to, medication refill, hyperglycemia, less likely DKA  Patient's presentation is most consistent with exacerbation of chronic illness.  Patient is a 73 year old male with history of type 2 diabetes presenting today after running out of his Mounjaro  for the past couple of weeks.  He had lost his primary care provider and unable to get a refill on this.  He has all of his other diabetic medications and other chronic medications without issue.  He noted his blood sugar to be elevated at home and came here to seek assistance getting a new prescription.  Otherwise does not have any symptoms here today with stable vital signs and unremarkable physical exam.  He has mild hyperglycemia at 217 but no other concerning features such as ketones or  beta hydroxybutyrate elevated to indicate DKA.  Laboratory workup elsewise reassuring.  Patient otherwise feels well and will represcribe his Mounjaro  with 3 months prescription to his pharmacy to give him enough time to get set up with a new PCP.  Stable for discharge.     FINAL CLINICAL IMPRESSION(S) / ED DIAGNOSES   Final diagnoses:  Hyperglycemia     Rx / DC Orders   ED Discharge Orders          Ordered    tirzepatide  (MOUNJARO ) 7.5 MG/0.5ML Pen  Weekly       Note to Pharmacy: NEED 3 MONTHS RX PLZZ   02/11/24 2208             Note:  This document was prepared using Dragon voice recognition software and may include unintentional dictation errors.   Malvina Alm DASEN, MD 02/11/24 2211

## 2024-02-11 NOTE — ED Notes (Signed)
 Per EMS pt will be getting evicted from his home on Thursday.

## 2024-02-11 NOTE — ED Triage Notes (Addendum)
 Pt brought in via ems from home with elevated blood sugar.    Pt out of insulin  for a couple of weeks per pt. Pt denies any pain. Pt is left bka.  Pt alert.

## 2024-02-11 NOTE — ED Notes (Signed)
Fsbs 224 in triage.

## 2024-02-11 NOTE — ED Notes (Signed)
 Spoke with lab to add on beta-hydroxy

## 2024-02-12 ENCOUNTER — Ambulatory Visit: Attending: Physician Assistant

## 2024-02-12 DIAGNOSIS — I10 Essential (primary) hypertension: Secondary | ICD-10-CM | POA: Diagnosis not present

## 2024-02-12 DIAGNOSIS — Z743 Need for continuous supervision: Secondary | ICD-10-CM | POA: Diagnosis not present

## 2024-02-12 NOTE — Telephone Encounter (Addendum)
 According to chart, rx sent on 02/11/24, #6 L/0 refills to [facility].   Message from pharmacy requesting 3 mo rx.

## 2024-02-13 MED ORDER — MOUNJARO 7.5 MG/0.5ML ~~LOC~~ SOAJ
7.5000 mg | SUBCUTANEOUS | 0 refills | Status: AC
Start: 2024-02-13 — End: 2024-05-13

## 2024-02-18 ENCOUNTER — Ambulatory Visit: Payer: Self-pay

## 2024-02-18 NOTE — Telephone Encounter (Signed)
 FYI Only or Action Required?: FYI only for provider.  Patient was last seen in primary care on 01/27/2024 by Danny Baller, MD.  Called Nurse Triage reporting Pruritis.  Symptoms began several weeks ago.  Interventions attempted: OTC medications: benadryl .  Symptoms are: gradually worsening.  Triage Disposition: See PCP When Office is Open (Within 3 Days)  Patient/caregiver understands and will follow disposition?: No, refuses disposition due to not having transportation: stated will attempt to get a ride and then call us  back.      Copied from CRM [id number]. Topic: Clinical - Red Word Triage >> Feb 18, 2024  1:58 PM Danny [name] wrote: Red Word that prompted transfer to Nurse Triage: Itchy to the point where he's bleeding. Possible allergic reaction to medication. Reason for Disposition  [1] MODERATE-SEVERE local itching (i.e., interferes with work, school, activities) AND [2] not improved after 24 hours of hydrocortisone  cream  Answer Assessment - Initial Assessment Questions 1. DESCRIPTION: Describe the itching you are having. Where is it located?     Back of neck and bilateral arms itching 2. SEVERITY: How bad is it?      severe 3. SCRATCHING: Are there any scratch marks? Bleeding?     Yes, alot 4. ONSET: When did the itching begin? (e.g., minutes, hours, days ago)     Ongoing and worsening last couple of weeks 5. CAUSE: What do you think is causing the itching?      unknown 6. OTHER SYMPTOMS: Do you have any other symptoms? (e.g., fever, rash)     none 7. PREGNANCY: Is there any chance you are pregnant? When was your last menstrual period?     Na  Pt stated areas itch real bad and he scratches so much that he tears the skin therefore bleeding from various locations throughout bilateral arms and neck.  Protocols used: Itching - Localized-A-AH

## 2024-02-18 NOTE — Telephone Encounter (Signed)
 This is a [employer] patient, previously of Dr Maribeth. I am not his PCP. I saw him once 01/27/2024 only to fill out an FL2 form that he urgently needed.  At that time advised to establish care with new provider at [facility]. He would need to establish with them.

## 2024-02-20 DIAGNOSIS — J449 Chronic obstructive pulmonary disease, unspecified: Secondary | ICD-10-CM | POA: Diagnosis not present

## 2024-02-20 DIAGNOSIS — Z89512 Acquired absence of left leg below knee: Secondary | ICD-10-CM | POA: Diagnosis not present

## 2024-02-20 DIAGNOSIS — Z9181 History of falling: Secondary | ICD-10-CM | POA: Diagnosis not present

## 2024-02-24 ENCOUNTER — Ambulatory Visit (LOCAL_COMMUNITY_HEALTH_CENTER)

## 2024-02-24 DIAGNOSIS — Z111 Encounter for screening for respiratory tuberculosis: Secondary | ICD-10-CM

## 2024-02-24 NOTE — Progress Notes (Signed)
 With adult son for PPD as needed for placement in nursing assisted living. Meets criteria for free PPD. Patient paid for PPD today.  RN walked patient to clerk for refund of $28. Has PPDR appt 02/26/2024. Maddalynn Barnard, RN

## 2024-02-26 ENCOUNTER — Ambulatory Visit (LOCAL_COMMUNITY_HEALTH_CENTER)

## 2024-02-26 ENCOUNTER — Other Ambulatory Visit: Payer: Self-pay

## 2024-02-26 DIAGNOSIS — F419 Anxiety disorder, unspecified: Secondary | ICD-10-CM | POA: Diagnosis present

## 2024-02-26 DIAGNOSIS — J449 Chronic obstructive pulmonary disease, unspecified: Secondary | ICD-10-CM | POA: Diagnosis present

## 2024-02-26 DIAGNOSIS — Z87891 Personal history of nicotine dependence: Secondary | ICD-10-CM | POA: Diagnosis not present

## 2024-02-26 DIAGNOSIS — Z809 Family history of malignant neoplasm, unspecified: Secondary | ICD-10-CM

## 2024-02-26 DIAGNOSIS — Z7985 Long-term (current) use of injectable non-insulin antidiabetic drugs: Secondary | ICD-10-CM | POA: Diagnosis not present

## 2024-02-26 DIAGNOSIS — Z89512 Acquired absence of left leg below knee: Secondary | ICD-10-CM | POA: Diagnosis not present

## 2024-02-26 DIAGNOSIS — Z888 Allergy status to other drugs, medicaments and biological substances status: Secondary | ICD-10-CM | POA: Diagnosis not present

## 2024-02-26 DIAGNOSIS — Z9889 Other specified postprocedural states: Secondary | ICD-10-CM | POA: Diagnosis not present

## 2024-02-26 DIAGNOSIS — F32A Depression, unspecified: Secondary | ICD-10-CM | POA: Diagnosis present

## 2024-02-26 DIAGNOSIS — I252 Old myocardial infarction: Secondary | ICD-10-CM

## 2024-02-26 DIAGNOSIS — I5042 Chronic combined systolic (congestive) and diastolic (congestive) heart failure: Secondary | ICD-10-CM | POA: Diagnosis present

## 2024-02-26 DIAGNOSIS — E1151 Type 2 diabetes mellitus with diabetic peripheral angiopathy without gangrene: Secondary | ICD-10-CM | POA: Diagnosis present

## 2024-02-26 DIAGNOSIS — I70211 Atherosclerosis of native arteries of extremities with intermittent claudication, right leg: Secondary | ICD-10-CM | POA: Diagnosis present

## 2024-02-26 DIAGNOSIS — I130 Hypertensive heart and chronic kidney disease with heart failure and stage 1 through stage 4 chronic kidney disease, or unspecified chronic kidney disease: Secondary | ICD-10-CM | POA: Diagnosis present

## 2024-02-26 DIAGNOSIS — N1831 Chronic kidney disease, stage 3a: Secondary | ICD-10-CM | POA: Diagnosis present

## 2024-02-26 DIAGNOSIS — I70221 Atherosclerosis of native arteries of extremities with rest pain, right leg: Secondary | ICD-10-CM | POA: Diagnosis not present

## 2024-02-26 DIAGNOSIS — I255 Ischemic cardiomyopathy: Secondary | ICD-10-CM | POA: Diagnosis present

## 2024-02-26 DIAGNOSIS — Z7902 Long term (current) use of antithrombotics/antiplatelets: Secondary | ICD-10-CM | POA: Diagnosis not present

## 2024-02-26 DIAGNOSIS — E785 Hyperlipidemia, unspecified: Secondary | ICD-10-CM | POA: Diagnosis present

## 2024-02-26 DIAGNOSIS — Z7982 Long term (current) use of aspirin: Secondary | ICD-10-CM | POA: Diagnosis not present

## 2024-02-26 DIAGNOSIS — N281 Cyst of kidney, acquired: Secondary | ICD-10-CM | POA: Diagnosis not present

## 2024-02-26 DIAGNOSIS — I2510 Atherosclerotic heart disease of native coronary artery without angina pectoris: Secondary | ICD-10-CM | POA: Diagnosis present

## 2024-02-26 DIAGNOSIS — M79604 Pain in right leg: Secondary | ICD-10-CM | POA: Diagnosis present

## 2024-02-26 DIAGNOSIS — I743 Embolism and thrombosis of arteries of the lower extremities: Secondary | ICD-10-CM | POA: Diagnosis present

## 2024-02-26 DIAGNOSIS — Z79899 Other long term (current) drug therapy: Secondary | ICD-10-CM

## 2024-02-26 DIAGNOSIS — Z91148 Patient's other noncompliance with medication regimen for other reason: Secondary | ICD-10-CM

## 2024-02-26 DIAGNOSIS — E1165 Type 2 diabetes mellitus with hyperglycemia: Secondary | ICD-10-CM | POA: Diagnosis present

## 2024-02-26 DIAGNOSIS — Z111 Encounter for screening for respiratory tuberculosis: Secondary | ICD-10-CM

## 2024-02-26 DIAGNOSIS — Z955 Presence of coronary angioplasty implant and graft: Secondary | ICD-10-CM | POA: Diagnosis not present

## 2024-02-26 DIAGNOSIS — I998 Other disorder of circulatory system: Secondary | ICD-10-CM | POA: Diagnosis not present

## 2024-02-26 DIAGNOSIS — G8929 Other chronic pain: Secondary | ICD-10-CM | POA: Diagnosis present

## 2024-02-26 DIAGNOSIS — R609 Edema, unspecified: Secondary | ICD-10-CM | POA: Diagnosis not present

## 2024-02-26 DIAGNOSIS — Z95828 Presence of other vascular implants and grafts: Secondary | ICD-10-CM | POA: Diagnosis not present

## 2024-02-26 DIAGNOSIS — E1122 Type 2 diabetes mellitus with diabetic chronic kidney disease: Secondary | ICD-10-CM | POA: Diagnosis present

## 2024-02-26 DIAGNOSIS — Z8249 Family history of ischemic heart disease and other diseases of the circulatory system: Secondary | ICD-10-CM

## 2024-02-26 DIAGNOSIS — I10 Essential (primary) hypertension: Secondary | ICD-10-CM | POA: Diagnosis not present

## 2024-02-26 DIAGNOSIS — R2241 Localized swelling, mass and lump, right lower limb: Secondary | ICD-10-CM | POA: Diagnosis not present

## 2024-02-26 LAB — TB SKIN TEST
Induration: 0 mm
TB Skin Test: NEGATIVE

## 2024-02-26 LAB — CBC WITH DIFFERENTIAL/PLATELET
Abs Immature Granulocytes: 0.06 K/uL (ref 0.00–0.07)
Basophils Absolute: 0 K/uL (ref 0.0–0.1)
Basophils Relative: 1 %
Eosinophils Absolute: 0.3 K/uL (ref 0.0–0.5)
Eosinophils Relative: 4 %
HCT: 47.8 % (ref 39.0–52.0)
Hemoglobin: 15.6 g/dL (ref 13.0–17.0)
Immature Granulocytes: 1 %
Lymphocytes Relative: 34 %
Lymphs Abs: 2.8 K/uL (ref 0.7–4.0)
MCH: 31.2 pg (ref 26.0–34.0)
MCHC: 32.6 g/dL (ref 30.0–36.0)
MCV: 95.6 fL (ref 80.0–100.0)
Monocytes Absolute: 1 K/uL (ref 0.1–1.0)
Monocytes Relative: 12 %
Neutro Abs: 4.1 K/uL (ref 1.7–7.7)
Neutrophils Relative %: 48 %
Platelets: 216 K/uL (ref 150–400)
RBC: 5 MIL/uL (ref 4.22–5.81)
RDW: 13 % (ref 11.5–15.5)
WBC: 8.3 K/uL (ref 4.0–10.5)
nRBC: 0 % (ref 0.0–0.2)

## 2024-02-26 NOTE — ED Triage Notes (Signed)
 Pt to ED via EMS from home, pt reports right foot and leg swelling xfew days. Pt reports he had his left foot and leg amputated 3 years ago and symptoms presented the same. Pt c/o pain with ambulation. Pts great toe has dry crusted blood under nail.

## 2024-02-27 ENCOUNTER — Encounter
Admission: EM | Disposition: A | Discharge status: Home / Self Care | Payer: Self-pay | Source: Home / Self Care | Attending: Internal Medicine

## 2024-02-27 ENCOUNTER — Emergency Department

## 2024-02-27 ENCOUNTER — Inpatient Hospital Stay
Admission: EM | Admit: 2024-02-27 | Discharge: 2024-02-28 | DRG: 271 | Disposition: A | Discharge status: Home / Self Care | Attending: Internal Medicine | Admitting: Internal Medicine

## 2024-02-27 DIAGNOSIS — M79604 Pain in right leg: Secondary | ICD-10-CM | POA: Diagnosis present

## 2024-02-27 DIAGNOSIS — E785 Hyperlipidemia, unspecified: Secondary | ICD-10-CM | POA: Diagnosis present

## 2024-02-27 DIAGNOSIS — G8929 Other chronic pain: Secondary | ICD-10-CM | POA: Diagnosis present

## 2024-02-27 DIAGNOSIS — Z955 Presence of coronary angioplasty implant and graft: Secondary | ICD-10-CM | POA: Diagnosis not present

## 2024-02-27 DIAGNOSIS — I743 Embolism and thrombosis of arteries of the lower extremities: Secondary | ICD-10-CM | POA: Diagnosis present

## 2024-02-27 DIAGNOSIS — R404 Transient alteration of awareness: Secondary | ICD-10-CM | POA: Diagnosis not present

## 2024-02-27 DIAGNOSIS — J449 Chronic obstructive pulmonary disease, unspecified: Secondary | ICD-10-CM | POA: Diagnosis present

## 2024-02-27 DIAGNOSIS — F419 Anxiety disorder, unspecified: Secondary | ICD-10-CM | POA: Diagnosis present

## 2024-02-27 DIAGNOSIS — I998 Other disorder of circulatory system: Secondary | ICD-10-CM | POA: Diagnosis not present

## 2024-02-27 DIAGNOSIS — Z7902 Long term (current) use of antithrombotics/antiplatelets: Secondary | ICD-10-CM | POA: Diagnosis not present

## 2024-02-27 DIAGNOSIS — I70211 Atherosclerosis of native arteries of extremities with intermittent claudication, right leg: Secondary | ICD-10-CM | POA: Diagnosis not present

## 2024-02-27 DIAGNOSIS — Z7401 Bed confinement status: Secondary | ICD-10-CM | POA: Diagnosis not present

## 2024-02-27 DIAGNOSIS — Z743 Need for continuous supervision: Secondary | ICD-10-CM | POA: Diagnosis not present

## 2024-02-27 DIAGNOSIS — Z888 Allergy status to other drugs, medicaments and biological substances status: Secondary | ICD-10-CM | POA: Diagnosis not present

## 2024-02-27 DIAGNOSIS — I5042 Chronic combined systolic (congestive) and diastolic (congestive) heart failure: Secondary | ICD-10-CM | POA: Diagnosis present

## 2024-02-27 DIAGNOSIS — I130 Hypertensive heart and chronic kidney disease with heart failure and stage 1 through stage 4 chronic kidney disease, or unspecified chronic kidney disease: Secondary | ICD-10-CM | POA: Diagnosis present

## 2024-02-27 DIAGNOSIS — I70219 Atherosclerosis of native arteries of extremities with intermittent claudication, unspecified extremity: Secondary | ICD-10-CM | POA: Diagnosis present

## 2024-02-27 DIAGNOSIS — Z9889 Other specified postprocedural states: Secondary | ICD-10-CM

## 2024-02-27 DIAGNOSIS — E1122 Type 2 diabetes mellitus with diabetic chronic kidney disease: Secondary | ICD-10-CM | POA: Diagnosis present

## 2024-02-27 DIAGNOSIS — Z8249 Family history of ischemic heart disease and other diseases of the circulatory system: Secondary | ICD-10-CM | POA: Diagnosis not present

## 2024-02-27 DIAGNOSIS — N1831 Chronic kidney disease, stage 3a: Secondary | ICD-10-CM | POA: Diagnosis present

## 2024-02-27 DIAGNOSIS — M7989 Other specified soft tissue disorders: Secondary | ICD-10-CM | POA: Diagnosis not present

## 2024-02-27 DIAGNOSIS — Z95828 Presence of other vascular implants and grafts: Secondary | ICD-10-CM | POA: Diagnosis not present

## 2024-02-27 DIAGNOSIS — I70221 Atherosclerosis of native arteries of extremities with rest pain, right leg: Secondary | ICD-10-CM | POA: Diagnosis not present

## 2024-02-27 DIAGNOSIS — Z809 Family history of malignant neoplasm, unspecified: Secondary | ICD-10-CM | POA: Diagnosis not present

## 2024-02-27 DIAGNOSIS — R29898 Other symptoms and signs involving the musculoskeletal system: Secondary | ICD-10-CM | POA: Diagnosis not present

## 2024-02-27 DIAGNOSIS — Z7985 Long-term (current) use of injectable non-insulin antidiabetic drugs: Secondary | ICD-10-CM | POA: Diagnosis not present

## 2024-02-27 DIAGNOSIS — Z7982 Long term (current) use of aspirin: Secondary | ICD-10-CM | POA: Diagnosis not present

## 2024-02-27 DIAGNOSIS — Z89512 Acquired absence of left leg below knee: Secondary | ICD-10-CM | POA: Diagnosis not present

## 2024-02-27 DIAGNOSIS — F32A Depression, unspecified: Secondary | ICD-10-CM | POA: Diagnosis present

## 2024-02-27 DIAGNOSIS — I255 Ischemic cardiomyopathy: Secondary | ICD-10-CM | POA: Diagnosis present

## 2024-02-27 DIAGNOSIS — E1151 Type 2 diabetes mellitus with diabetic peripheral angiopathy without gangrene: Secondary | ICD-10-CM | POA: Diagnosis present

## 2024-02-27 DIAGNOSIS — E1165 Type 2 diabetes mellitus with hyperglycemia: Secondary | ICD-10-CM | POA: Diagnosis present

## 2024-02-27 DIAGNOSIS — I2510 Atherosclerotic heart disease of native coronary artery without angina pectoris: Secondary | ICD-10-CM | POA: Diagnosis present

## 2024-02-27 DIAGNOSIS — Z87891 Personal history of nicotine dependence: Secondary | ICD-10-CM | POA: Diagnosis not present

## 2024-02-27 HISTORY — PX: LOWER EXTREMITY ANGIOGRAPHY: CATH118251

## 2024-02-27 LAB — GLUCOSE, CAPILLARY
Glucose-Capillary: 138 mg/dL — ABNORMAL HIGH (ref 70–99)
Glucose-Capillary: 158 mg/dL — ABNORMAL HIGH (ref 70–99)

## 2024-02-27 LAB — BASIC METABOLIC PANEL WITH GFR
Anion gap: 9 (ref 5–15)
BUN: 34 mg/dL — ABNORMAL HIGH (ref 8–23)
CO2: 21 mmol/L — ABNORMAL LOW (ref 22–32)
Calcium: 8.8 mg/dL — ABNORMAL LOW (ref 8.9–10.3)
Chloride: 106 mmol/L (ref 98–111)
Creatinine, Ser: 1.35 mg/dL — ABNORMAL HIGH (ref 0.61–1.24)
GFR, Estimated: 55 mL/min — ABNORMAL LOW (ref >60)
Glucose, Bld: 217 mg/dL — ABNORMAL HIGH (ref 70–99)
Potassium: 3.5 mmol/L (ref 3.5–5.1)
Sodium: 136 mmol/L (ref 135–145)

## 2024-02-27 LAB — PROTIME-INR
INR: 1.2 (ref 0.8–1.2)
Prothrombin Time: 15.3 s — ABNORMAL HIGH (ref 11.4–15.2)

## 2024-02-27 LAB — CBG MONITORING, ED: Glucose-Capillary: 143 mg/dL — ABNORMAL HIGH (ref 70–99)

## 2024-02-27 LAB — APTT: aPTT: 32 s (ref 24–36)

## 2024-02-27 SURGERY — LOWER EXTREMITY ANGIOGRAPHY
Anesthesia: Moderate Sedation | Laterality: Right

## 2024-02-27 MED ORDER — TAMSULOSIN HCL 0.4 MG PO CAPS
0.4000 mg | ORAL_CAPSULE | Freq: Every day | ORAL | Status: DC
  Administered 2024-02-27 – 2024-02-28 (×2): 0.4 mg via ORAL
  Filled 2024-02-27 (×2): qty 1

## 2024-02-27 MED ORDER — HEPARIN SODIUM (PORCINE) 1000 UNIT/ML IJ SOLN
INTRAMUSCULAR | Status: AC
Start: 2024-02-27 — End: 2024-02-27
  Filled 2024-02-27: qty 10

## 2024-02-27 MED ORDER — UMECLIDINIUM-VILANTEROL 62.5-25 MCG/ACT IN AEPB
1.0000 | INHALATION_SPRAY | Freq: Every day | RESPIRATORY_TRACT | Status: DC
  Administered 2024-02-28: 1 via RESPIRATORY_TRACT
  Filled 2024-02-27 (×2): qty 14

## 2024-02-27 MED ORDER — CEFAZOLIN SODIUM-DEXTROSE 2-4 GM/100ML-% IV SOLN
INTRAVENOUS | Status: AC
  Filled 2024-02-27: qty 100

## 2024-02-27 MED ORDER — ONDANSETRON HCL 4 MG PO TABS: 4.0000 mg | ORAL_TABLET | Freq: Four times a day (QID) | ORAL | Status: DC | PRN

## 2024-02-27 MED ORDER — ASPIRIN 81 MG PO TBEC
81.0000 mg | DELAYED_RELEASE_TABLET | Freq: Every day | ORAL | Status: DC
  Administered 2024-02-27 – 2024-02-28 (×2): 81 mg via ORAL
  Filled 2024-02-27 (×2): qty 1

## 2024-02-27 MED ORDER — ATORVASTATIN CALCIUM 80 MG PO TABS
80.0000 mg | ORAL_TABLET | Freq: Every day | ORAL | Status: DC
  Administered 2024-02-27 – 2024-02-28 (×2): 80 mg via ORAL
  Filled 2024-02-27: qty 1
  Filled 2024-02-27: qty 4

## 2024-02-27 MED ORDER — CEFAZOLIN SODIUM-DEXTROSE 2-4 GM/100ML-% IV SOLN
2.0000 g | INTRAVENOUS | Status: AC
  Administered 2024-02-27: 2 g via INTRAVENOUS

## 2024-02-27 MED ORDER — FENTANYL CITRATE (PF) 100 MCG/2ML IJ SOLN
INTRAMUSCULAR | Status: AC
  Filled 2024-02-27: qty 2

## 2024-02-27 MED ORDER — HEPARIN BOLUS VIA INFUSION
6000.0000 [IU] | Freq: Once | INTRAVENOUS | Status: AC
  Administered 2024-02-27: 6000 [IU] via INTRAVENOUS
  Filled 2024-02-27: qty 6000

## 2024-02-27 MED ORDER — FENTANYL CITRATE (PF) 100 MCG/2ML IJ SOLN
INTRAMUSCULAR | Status: DC | PRN
  Administered 2024-02-27: 50 ug via INTRAVENOUS

## 2024-02-27 MED ORDER — HEPARIN (PORCINE) IN NACL 1000-0.9 UT/500ML-% IV SOLN
INTRAVENOUS | Status: DC | PRN
  Administered 2024-02-27: 500 mL

## 2024-02-27 MED ORDER — ACETAMINOPHEN 325 MG PO TABS
650.0000 mg | ORAL_TABLET | ORAL | Status: DC | PRN
  Administered 2024-02-28: 650 mg via ORAL
  Filled 2024-02-27: qty 2

## 2024-02-27 MED ORDER — HYDRALAZINE HCL 20 MG/ML IJ SOLN: 5.0000 mg | Freq: Four times a day (QID) | INTRAMUSCULAR | Status: DC | PRN

## 2024-02-27 MED ORDER — ESCITALOPRAM OXALATE 10 MG PO TABS
10.0000 mg | ORAL_TABLET | Freq: Every day | ORAL | Status: DC
  Administered 2024-02-27 – 2024-02-28 (×2): 10 mg via ORAL
  Filled 2024-02-27 (×2): qty 1

## 2024-02-27 MED ORDER — INSULIN ASPART 100 UNIT/ML IJ SOLN
0.0000 [IU] | Freq: Three times a day (TID) | INTRAMUSCULAR | Status: DC
  Administered 2024-02-27 (×2): 2 [IU] via SUBCUTANEOUS
  Administered 2024-02-28: 3 [IU] via SUBCUTANEOUS
  Administered 2024-02-28: 5 [IU] via SUBCUTANEOUS
  Filled 2024-02-27 (×4): qty 1

## 2024-02-27 MED ORDER — ACETAMINOPHEN 500 MG PO TABS
1000.0000 mg | ORAL_TABLET | Freq: Once | ORAL | Status: AC
  Administered 2024-02-27: 1000 mg via ORAL
  Filled 2024-02-27: qty 2

## 2024-02-27 MED ORDER — SODIUM CHLORIDE 0.9 % IV SOLN: INTRAVENOUS | Status: DC

## 2024-02-27 MED ORDER — IODIXANOL 320 MG/ML IV SOLN
INTRAVENOUS | Status: DC | PRN
  Administered 2024-02-27: 55 mL

## 2024-02-27 MED ORDER — CLOPIDOGREL BISULFATE 75 MG PO TABS
75.0000 mg | ORAL_TABLET | Freq: Every day | ORAL | Status: DC
  Administered 2024-02-27 – 2024-02-28 (×2): 75 mg via ORAL
  Filled 2024-02-27 (×2): qty 1

## 2024-02-27 MED ORDER — IOHEXOL 350 MG/ML SOLN
125.0000 mL | Freq: Once | INTRAVENOUS | Status: AC | PRN
  Administered 2024-02-27: 125 mL via INTRAVENOUS

## 2024-02-27 MED ORDER — HYDROMORPHONE HCL 1 MG/ML IJ SOLN: 0.5000 mg | INTRAMUSCULAR | Status: DC | PRN

## 2024-02-27 MED ORDER — HEPARIN (PORCINE) 25000 UT/250ML-% IV SOLN
1300.0000 [IU]/h | INTRAVENOUS | Status: DC
  Administered 2024-02-27 (×2): 1500 [IU]/h via INTRAVENOUS
  Administered 2024-02-28: 1300 [IU]/h via INTRAVENOUS
  Filled 2024-02-27 (×2): qty 250

## 2024-02-27 MED ORDER — MIDAZOLAM HCL 2 MG/2ML IJ SOLN
INTRAMUSCULAR | Status: DC | PRN
  Administered 2024-02-27: 2 mg via INTRAVENOUS

## 2024-02-27 MED ORDER — DIPHENHYDRAMINE HCL 50 MG/ML IJ SOLN
INTRAMUSCULAR | Status: DC | PRN
  Administered 2024-02-27: 50 mg via INTRAVENOUS

## 2024-02-27 MED ORDER — ALBUTEROL SULFATE (2.5 MG/3ML) 0.083% IN NEBU: 2.5000 mg | INHALATION_SOLUTION | Freq: Four times a day (QID) | RESPIRATORY_TRACT | Status: DC | PRN

## 2024-02-27 MED ORDER — DIPHENHYDRAMINE HCL 50 MG/ML IJ SOLN
INTRAMUSCULAR | Status: AC
  Filled 2024-02-27: qty 1

## 2024-02-27 MED ORDER — MIDAZOLAM HCL 2 MG/2ML IJ SOLN
INTRAMUSCULAR | Status: AC
  Filled 2024-02-27: qty 4

## 2024-02-27 MED ORDER — ONDANSETRON HCL 4 MG/2ML IJ SOLN: 4.0000 mg | Freq: Four times a day (QID) | INTRAMUSCULAR | Status: DC | PRN

## 2024-02-27 MED ORDER — HEPARIN SODIUM (PORCINE) 1000 UNIT/ML IJ SOLN
INTRAMUSCULAR | Status: DC | PRN
  Administered 2024-02-27: 5000 [IU] via INTRAVENOUS

## 2024-02-27 MED ORDER — LIDOCAINE-EPINEPHRINE (PF) 1 %-1:200000 IJ SOLN
INTRAMUSCULAR | Status: DC | PRN
  Administered 2024-02-27: 10 mL

## 2024-02-27 SURGICAL SUPPLY — 19 items
BALLOON ULTRVRSE 3X150X150 (BALLOONS) IMPLANT
BALLOON ULTRVRSE 5X150X150 (BALLOONS) IMPLANT
CANISTER PENUMBRA ENGINE (MISCELLANEOUS) IMPLANT
CATH ANGIO 5F PIGTAIL 65CM (CATHETERS) IMPLANT
CATH CXI SUPP ST 4FR 135CM (CATHETERS) IMPLANT
CATH LIGHTNING BOLT 6 XTX (CATHETERS) IMPLANT
COVER DRAPE FLUORO 36X44 (DRAPES) IMPLANT
COVER PROBE ULTRASOUND 5X96 (MISCELLANEOUS) IMPLANT
DEVICE PRESTO INFLATION (MISCELLANEOUS) IMPLANT
DEVICE STARCLOSE SE CLOSURE (Vascular Products) IMPLANT
GLIDEWIRE ADV .014X300CM (WIRE) IMPLANT
GLIDEWIRE ADV .035X260CM (WIRE) IMPLANT
PACK ANGIOGRAPHY (CUSTOM PROCEDURE TRAY) ×1 IMPLANT
SHEATH BRITE TIP 5FRX11 (SHEATH) IMPLANT
SHEATH RAABE 6FRX70 (SHEATH) IMPLANT
SYR MEDRAD MARK 7 150ML (SYRINGE) IMPLANT
TUBING CONTRAST HIGH PRESS 72 (TUBING) IMPLANT
WIRE G V18X300CM (WIRE) IMPLANT
WIRE J 3MM .035X145CM (WIRE) IMPLANT

## 2024-02-27 NOTE — Consult Note (Signed)
 Hospital Consult    Reason for Consult:  Right lower extremity Ischemia. Requesting Physician:  Dr Clotilda Samples MD MRN #:  [id number]  History of Present Illness: This is a 73 y.o. male  male past medical history significant for PAD, diabetes, left BKA, presents to the emergency department with right leg pain.  Patient states that he has been having ongoing issues with his right leg but today it acutely worsened.  Patient endorses being non compliant with all anticoagulation medication such as ASA, plavix  or Eliquis. Patient endorses he has no idea what medications he is to be taking. Patient was last seen by vascular surgery on 07/02/23 in the office for leg swelling. Vascular Surgery consulted to evaluate.   Past Medical History:  Diagnosis Date   CAD (coronary artery disease)    a. 01/2010 PCI of LAD; b. 09/2014 PCI/DES of mLAD due to ISR. D1 80, D2 80(jailed), LCX 4m; c. 07/2016 NSTEMI/Cath: LM nl, LAD 74m ISR, 40d, D1 90ost, 80p, D2 90ost, RI min irregs, LCX min irregs, OM1 90 small, OM2/3 min irregs, RCA min irregs, RPLB1 90, EF 25-35%.   Cellulitis of left foot 11/09/2020   Chronic combined systolic and diastolic CHF (congestive heart failure) (HCC)    a. 07/2016 Echo: EF 30%, severe septal/anterior HK, Gr1 DD.   CKD (chronic kidney disease), stage III (HCC)    COPD (chronic obstructive pulmonary disease) (HCC)    DM2 (diabetes mellitus, type 2) (HCC)    Erectile dysfunction    HLD (hyperlipidemia)    HTN (hypertension)    Ischemic cardiomyopathy    a. 07/2016 Echo: EF 30% w/ sev septal/ant HK. Gr1 DD.    Past Surgical History:  Procedure Laterality Date   AMPUTATION Left 11/10/2020   Procedure: AMPUTATION BELOW KNEE;  Surgeon: Marea Selinda RAMAN, MD;  Location: [facility] ORS;  Service: General;  Laterality: Left;   BELOW KNEE LEG AMPUTATION     CAD: stent to the LAD     CARDIAC CATHETERIZATION  10/01/2014   CARDIAC CATHETERIZATION N/A 07/23/2016   Procedure: Left Heart Cath and Coronary  Angiography;  Surgeon: Deatrice DELENA Cage, MD;  Location: [facility] IN[facility]AS[facility];  Service: Cardiovascular;  Laterality: N/A;   CORONARY ANGIOPLASTY WITH STENT PLACEMENT  10/01/2014   LOWER EXTREMITY ANGIOGRAPHY Left 10/31/2020   Procedure: LOWER EXTREMITY ANGIOGRAPHY;  Surgeon: Marea Selinda RAMAN, MD;  Location: [facility] CV [facility];  Service: Cardiovascular;  Laterality: Left;   LOWER EXTREMITY ANGIOGRAPHY Right 11/20/2021   Procedure: Lower Extremity Angiography;  Surgeon: Marea Selinda RAMAN, MD;  Location: [facility];  Service: Cardiovascular;  Laterality: Right;    Allergies  Allergen Reactions   Metformin  And Related Diarrhea    If he takes it twice a day it causes diarhea, so he reduced to once daily    Prior to Admission medications   Medication Sig Start Date End Date Taking? Authorizing Provider  acetaminophen  (TYLENOL ) 325 MG tablet Take 2 tablets (650 mg total) by mouth every 4 (four) hours as needed for mild pain (or temp > 37.5 C (99.5 F)). 10/10/21   Raenelle Coria, MD  albuterol  (PROVENTIL ) (2.5 MG/3ML) 0.083% nebulizer solution Take 3 mLs (2.5 mg total) by nebulization every 6 (six) hours as needed for wheezing or shortness of breath. 09/30/20   Maribeth Camellia MATSU, MD  albuterol  (VENTOLIN  HFA) 108 (90 Base) MCG/ACT inhaler Inhale 2 puffs into the lungs every 6 (six) hours as needed for wheezing or shortness of breath. 02/18/23  Claudene Rover, MD  amLODipine  (NORVASC ) 10 MG tablet TAKE 1 TABLET BY MOUTH ONCE DAILY 01/28/24   Rilla Baller, MD  aspirin  EC 81 MG tablet Take 1 tablet (81 mg total) by mouth daily. 02/25/23   Maribeth Camellia MATSU, MD  atorvastatin  (LIPITOR ) 80 MG tablet Take 1 tablet (80 mg total) by mouth daily. 01/27/24   Rilla Baller, MD  carvedilol  (COREG ) 25 MG tablet Take 1 tablet (25 mg total) by mouth 2 (two) times daily with a meal. 01/27/24   Rilla Baller, MD  clopidogrel  (PLAVIX ) 75 MG tablet TAKE 1 TABLET BY MOUTH ONCE DAILY 12/18/23   Marea Selinda RAMAN, MD   Continuous Glucose Receiver (FREESTYLE LIBRE 2 READER) DEVI 1 Device by Does not apply route 4 (four) times daily. 05/14/23   Maribeth Camellia MATSU, MD  Continuous Glucose Sensor (FREESTYLE LIBRE 2 SENSOR) MISC Check blood sugars 03/25/23   Maribeth Camellia MATSU, MD  empagliflozin  (JARDIANCE ) 25 MG TABS tablet Take 1 tablet (25 mg total) by mouth daily. Patient not taking: Reported on 02/24/2024 01/27/24   Rilla Baller, MD  escitalopram  (LEXAPRO ) 10 MG tablet Take 1 tablet (10 mg total) by mouth daily. 01/27/24   Rilla Baller, MD  furosemide  (LASIX ) 20 MG tablet Take 1 tablet (20 mg total) by mouth daily as needed for edema. 12/27/23 03/26/24  Lorene Lesley CROME, PA-C  Insulin  Pen Needle (ULTRACARE PEN NEEDLES) 32G X 6 MM MISC USE AS DIRECTED. TWICE DAILY 06/21/23   Maribeth Camellia MATSU, MD  nitroGLYCERIN  (NITROSTAT ) 0.4 MG SL tablet Place 0.4 mg under the tongue every 5 (five) minutes as needed for chest pain.    [provider]  spironolactone  (ALDACTONE ) 25 MG tablet Take 1 tablet (25 mg total) by mouth daily. 01/27/24   Rilla Baller, MD  tamsulosin  (FLOMAX ) 0.4 MG CAPS capsule TAKE 1 CAPSULE BY MOUTH ONCE DAILY 01/28/24   Rilla Baller, MD  tirzepatide  (MOUNJARO ) 7.5 MG/0.5ML Pen Inject 7.5 mg into the skin once a week. 02/13/24 05/13/24  Rilla Baller, MD  umeclidinium-vilanterol (ANORO ELLIPTA ) 62.5-25 MCG/ACT AEPB Inhale 1 puff into the lungs daily. Patient taking differently: Inhale 1 puff into the lungs daily. Per pt, takes prn 01/27/24   Rilla Baller, MD    Social History   Socioeconomic History   Marital status: Divorced    Spouse name: Not on file   Number of children: Not on file   Years of education: Not on file   Highest education level: Not on file  Occupational History   Not on file  Tobacco Use   Smoking status: Former    Current packs/day: 0.00    Types: Cigarettes    Start date: 09/07/2009    Quit date: 09/07/2009    Years since quitting: 14.4    Smokeless tobacco: Never   Tobacco comments:    quit june 2011  Vaping Use   Vaping status: Never Used  Substance and Sexual Activity   Alcohol use: Not Currently    Alcohol/week: 7.0 standard drinks of alcohol    Types: 6 Cans of beer, 1 Standard drinks or equivalent per week    Comment: last week 07/26/2022   Drug use: Never   Sexual activity: Not Currently  Other Topics Concern   Not on file  Social History Narrative   divorced   Social Drivers of Health   Financial Resource Strain: Low Risk  (04/24/2023)   Overall Financial Resource Strain (CARDIA)    Difficulty of Paying Living Expenses: Not hard  at all  Food Insecurity: No Food Insecurity (04/24/2023)   Hunger Vital Sign    Worried About Running Out of Food in the Last Year: Never true    Ran Out of Food in the Last Year: Never true  Transportation Needs: No Transportation Needs (04/24/2023)   PRAPARE - Administrator, Civil Service (Medical): No    Lack of Transportation (Non-Medical): No  Physical Activity: Inactive (04/24/2023)   Exercise Vital Sign    Days of Exercise per Week: 0 days    Minutes of Exercise per Session: 0 min  Stress: Stress Concern Present (04/24/2023)   [employer] of Occupational Health - Occupational Stress Questionnaire    Feeling of Stress : To some extent  Social Connections: Socially Isolated (04/24/2023)   Social Connection and Isolation Panel    Frequency of Communication with Friends and Family: More than three times a week    Frequency of Social Gatherings with Friends and Family: More than three times a week    Attends Religious Services: Never    Database administrator or Organizations: No    Attends Banker Meetings: Never    Marital Status: Divorced  Catering manager Violence: Not At Risk (04/24/2023)   Humiliation, Afraid, Rape, and Kick questionnaire    Fear of Current or Ex-Partner: No    Emotionally Abused: No    Physically Abused: No    Sexually  Abused: No     Family History  Problem Relation Age of Onset   Heart attack Father        complications   Cancer Brother     ROS: Otherwise negative unless mentioned in HPI  Physical Examination  Vitals:   02/26/24 2343 02/27/24 0525  BP: 122/88 110/66  Pulse: 85 79  Resp: 18 16  Temp: 98.2 F (36.8 C) 98.4 F (36.9 C)  SpO2: 95% 99%   Body mass index is 33.49 kg/m.  General:  WDWN in NAD Gait: Not observed HENT: WNL, normocephalic Pulmonary: normal non-labored breathing, without Rales, rhonchi,  wheezing Cardiac: regular, without  Murmurs, rubs or gallops; without carotid bruits Abdomen: Positive bowel sounds throughout, soft, NT/ND, no masses Skin: without rashes Vascular Exam/Pulses: Left BKA, Right doppler DP no PT pulse found. Leg is warm to touch to the ankle but foot is cool with sluggish cap refill.  Extremities: without ischemic changes, without Gangrene , without cellulitis; without open wounds;  Musculoskeletal: no muscle wasting or atrophy  Neurologic: A&O X 3;  No focal weakness or paresthesias are detected; speech is fluent/normal Psychiatric:  The pt has Normal affect. Lymph:  Unremarkable  CBC    Component Value Date/Time   WBC 8.3 02/26/2024 2347   RBC 5.00 02/26/2024 2347   HGB 15.6 02/26/2024 2347   HGB 15.1 11/23/2014 2006   HCT 47.8 02/26/2024 2347   HCT 46.1 11/23/2014 2006   PLT 216 02/26/2024 2347   PLT 229 11/23/2014 2006   MCV 95.6 02/26/2024 2347   MCV 93 11/23/2014 2006   MCH 31.2 02/26/2024 2347   MCHC 32.6 02/26/2024 2347   RDW 13.0 02/26/2024 2347   RDW 12.7 11/23/2014 2006   LYMPHSABS 2.8 02/26/2024 2347   MONOABS 1.0 02/26/2024 2347   EOSABS 0.3 02/26/2024 2347   BASOSABS 0.0 02/26/2024 2347    BMET    Component Value Date/Time   NA 136 02/26/2024 2347   NA 141 03/01/2023 1555   NA 135 11/23/2014 2006   K 3.5 02/26/2024  2347   K 4.0 11/23/2014 2006   CL 106 02/26/2024 2347   CL 100 (L) 11/23/2014 2006   CO2 21  (L) 02/26/2024 2347   CO2 24 11/23/2014 2006   GLUCOSE 217 (H) 02/26/2024 2347   GLUCOSE 370 (H) 11/23/2014 2006   BUN 34 (H) 02/26/2024 2347   BUN 29 (H) 03/01/2023 1555   BUN 40 (H) 11/23/2014 2006   CREATININE 1.35 (H) 02/26/2024 2347   CREATININE 2.16 (H) 11/23/2014 2006   CALCIUM  8.8 (L) 02/26/2024 2347   CALCIUM  8.6 (L) 11/23/2014 2006   GFRNONAA 55 (L) 02/26/2024 2347   GFRNONAA 31 (L) 11/23/2014 2006   GFRAA 37 (L) 12/24/2019 1608   GFRAA 36 (L) 11/23/2014 2006    COAGS: Lab Results  Component Value Date   INR 1.1 06/30/2022   INR 1.1 09/30/2021   INR 1.3 (H) 11/10/2020     Non-Invasive Vascular Imaging:   none  Statin:  Yes.   Beta Blocker:  Yes.   Aspirin :  Yes.   ACEI:  No. ARB:  No. CCB use:  Yes Other antiplatelets/anticoagulants:  Yes.   Plavix  75 mg daily.   Patient endorses he has been non compliant with all the above listed medications     ASSESSMENT/PLAN: This is a 73 y.o. male who is well known to vascular surgery. Patient has had a prior left BKA and known PAD. He has HX of 3 stents placed to his right lower extremity. He endorses not being compliant with his medications. He presents to the emergency room with increased right lower extremity pain. On exam his leg is warm to touch and has a doppler DP pulse but no PT pulse. His foot is cool but not cold. Sluggish cap refill.   Vascular Surgery plans on taking the patient to the vascular lab later today for a right lower extremity angiogram with possible intervention. I discussed at the bedside in the emergency department in detail the procedure, benefits, risks and complications. He verbalized his understanding and wishes to proceed. I answered all his questions today. Patient was started on a heparin  infusion while in the ER. Patient endorses he has been NPO since yesterday morning    I discussed the case with Dr Selinda Gu MD and he agrees with the plan.    Gwendlyn JONELLE [facility] 02/27/2024 9:45 AM

## 2024-02-27 NOTE — ED Notes (Signed)
 Pt c/o being cold. Pt given two warm blankets. Pt content at this time. Pt has no further needs.

## 2024-02-27 NOTE — H&P (Addendum)
 History and Physical    Danny HASKIN Sr. FMW:[id number] DOB: 17-May-1951 DOA: 02/27/2024  PCP: Rilla Baller, MD (Confirm with patient/family/NH records and if not entered, this has to be entered at [facility] point of entry) Patient coming from: Home  I have personally briefly reviewed patient's old medical records in [facility]  Chief Complaint: Right leg swelling and pain  HPI: Danny STEELY Sr. is a 73 y.o. male with medical history significant of PVD s/p left BKA, HTN, chronic combined HFrEF and HFpEF, CKD stage IIIa, COPD, IIDM, HLD, medication noncompliance, presented with worsening of right leg pain, swelling and discoloration.  Symptoms has been started  few weeks ago, patient started noticed worsening of claudication with right lower extremity, with pain and discoloration and cool to touch.  Last 2 days symptoms as getting worse.  He however also reported that he has been only taking his medications  here and there and unsure has been taking aspirin  and Plavix  every day.  Denied any chest pain, no fever or chills.    ED Course: Afebrile, nontachycardic blood pressure 100/64.  Patient 98% on room air.  DVT study is pending and CT angiogram of right lower extremity is pending.  Blood work showed glucose 217 BUN 34 creatinine 1.3 bicarb 21K 3.5, WBC 8.3 hemoglobin 15.6.  Vascular surgeon consulted and patient started on heparin  drip.  Review of Systems: As per HPI otherwise 14 point review of systems negative.    Past Medical History:  Diagnosis Date   CAD (coronary artery disease)    a. 01/2010 PCI of LAD; b. 09/2014 PCI/DES of mLAD due to ISR. D1 80, D2 80(jailed), LCX 46m; c. 07/2016 NSTEMI/Cath: LM nl, LAD 48m ISR, 40d, D1 90ost, 80p, D2 90ost, RI min irregs, LCX min irregs, OM1 90 small, OM2/3 min irregs, RCA min irregs, RPLB1 90, EF 25-35%.   Cellulitis of left foot 11/09/2020   Chronic combined systolic and diastolic CHF (congestive heart failure) (HCC)    a.  07/2016 Echo: EF 30%, severe septal/anterior HK, Gr1 DD.   CKD (chronic kidney disease), stage III (HCC)    COPD (chronic obstructive pulmonary disease) (HCC)    DM2 (diabetes mellitus, type 2) (HCC)    Erectile dysfunction    HLD (hyperlipidemia)    HTN (hypertension)    Ischemic cardiomyopathy    a. 07/2016 Echo: EF 30% w/ sev septal/ant HK. Gr1 DD.    Past Surgical History:  Procedure Laterality Date   AMPUTATION Left 11/10/2020   Procedure: AMPUTATION BELOW KNEE;  Surgeon: Marea Selinda RAMAN, MD;  Location: [facility] ORS;  Service: General;  Laterality: Left;   BELOW KNEE LEG AMPUTATION     CAD: stent to the LAD     CARDIAC CATHETERIZATION  10/01/2014   CARDIAC CATHETERIZATION N/A 07/23/2016   Procedure: Left Heart Cath and Coronary Angiography;  Surgeon: Deatrice DELENA Cage, MD;  Location: [facility] INVASIVE CV LAB;  Service: Cardiovascular;  Laterality: N/A;   CORONARY ANGIOPLASTY WITH STENT PLACEMENT  10/01/2014   LOWER EXTREMITY ANGIOGRAPHY Left 10/31/2020   Procedure: LOWER EXTREMITY ANGIOGRAPHY;  Surgeon: Marea Selinda RAMAN, MD;  Location: [facility] INVASIVE CV LAB;  Service: Cardiovascular;  Laterality: Left;   LOWER EXTREMITY ANGIOGRAPHY Right 11/20/2021   Procedure: Lower Extremity Angiography;  Surgeon: Marea Selinda RAMAN, MD;  Location: [facility] INVASIVE CV LAB;  Service: Cardiovascular;  Laterality: Right;     reports that he quit smoking about 14 years ago. His smoking use included cigarettes. He started smoking about  14 years ago. He has never used smokeless tobacco. He reports that he does not currently use alcohol after a past usage of about 7.0 standard drinks of alcohol per week. He reports that he does not use drugs.  Allergies  Allergen Reactions   Metformin  And Related Diarrhea    If he takes it twice a day it causes diarhea, so he reduced to once daily    Family History  Problem Relation Age of Onset   Heart attack Father        complications   Cancer Brother      Prior to Admission  medications   Medication Sig Start Date End Date Taking? Authorizing Provider  acetaminophen  (TYLENOL ) 325 MG tablet Take 2 tablets (650 mg total) by mouth every 4 (four) hours as needed for mild pain (or temp > 37.5 C (99.5 F)). 10/10/21   Raenelle Coria, MD  albuterol  (PROVENTIL ) (2.5 MG/3ML) 0.083% nebulizer solution Take 3 mLs (2.5 mg total) by nebulization every 6 (six) hours as needed for wheezing or shortness of breath. 09/30/20   Maribeth Camellia MATSU, MD  albuterol  (VENTOLIN  HFA) 108 (90 Base) MCG/ACT inhaler Inhale 2 puffs into the lungs every 6 (six) hours as needed for wheezing or shortness of breath. 02/18/23   Claudene Rover, MD  amLODipine  (NORVASC ) 10 MG tablet TAKE 1 TABLET BY MOUTH ONCE DAILY 01/28/24   Rilla Baller, MD  aspirin  EC 81 MG tablet Take 1 tablet (81 mg total) by mouth daily. 02/25/23   Maribeth Camellia MATSU, MD  atorvastatin  (LIPITOR ) 80 MG tablet Take 1 tablet (80 mg total) by mouth daily. 01/27/24   Rilla Baller, MD  carvedilol  (COREG ) 25 MG tablet Take 1 tablet (25 mg total) by mouth 2 (two) times daily with a meal. 01/27/24   Rilla Baller, MD  clopidogrel  (PLAVIX ) 75 MG tablet TAKE 1 TABLET BY MOUTH ONCE DAILY 12/18/23   Dew, Jason S, MD  Continuous Glucose Receiver (FREESTYLE LIBRE 2 READER) DEVI 1 Device by Does not apply route 4 (four) times daily. 05/14/23   Maribeth Camellia MATSU, MD  Continuous Glucose Sensor (FREESTYLE LIBRE 2 SENSOR) MISC Check blood sugars 03/25/23   Maribeth Camellia MATSU, MD  empagliflozin  (JARDIANCE ) 25 MG TABS tablet Take 1 tablet (25 mg total) by mouth daily. Patient not taking: Reported on 02/24/2024 01/27/24   Rilla Baller, MD  escitalopram  (LEXAPRO ) 10 MG tablet Take 1 tablet (10 mg total) by mouth daily. 01/27/24   Rilla Baller, MD  furosemide  (LASIX ) 20 MG tablet Take 1 tablet (20 mg total) by mouth daily as needed for edema. 12/27/23 03/26/24  Lorene Sinclair L, PA-C  Insulin  Pen Needle (ULTRACARE PEN NEEDLES) 32G X 6 MM MISC USE AS  DIRECTED. TWICE DAILY 06/21/23   Maribeth Camellia MATSU, MD  nitroGLYCERIN  (NITROSTAT ) 0.4 MG SL tablet Place 0.4 mg under the tongue every 5 (five) minutes as needed for chest pain.    [provider]  spironolactone  (ALDACTONE ) 25 MG tablet Take 1 tablet (25 mg total) by mouth daily. 01/27/24   Rilla Baller, MD  tamsulosin  (FLOMAX ) 0.4 MG CAPS capsule TAKE 1 CAPSULE BY MOUTH ONCE DAILY 01/28/24   Rilla Baller, MD  tirzepatide  (MOUNJARO ) 7.5 MG/0.5ML Pen Inject 7.5 mg into the skin once a week. 02/13/24 05/13/24  Rilla Baller, MD  umeclidinium-vilanterol (ANORO ELLIPTA ) 62.5-25 MCG/ACT AEPB Inhale 1 puff into the lungs daily. Patient taking differently: Inhale 1 puff into the lungs daily. Per pt, takes prn 01/27/24   Rilla Baller,  MD    Physical Exam: Vitals:   02/26/24 2342 02/26/24 2343 02/27/24 0525 02/27/24 0959  BP:  122/88 110/66 100/64  Pulse:  85 79   Resp:  18 16 18   Temp:  98.2 F (36.8 C) 98.4 F (36.9 C) (!) 96.3 F (35.7 C)  TempSrc:    Axillary  SpO2:  95% 99% 98%  Weight: 112 kg     Height: 6' (1.829 m)       Constitutional: NAD, calm, comfortable Vitals:   02/26/24 2342 02/26/24 2343 02/27/24 0525 02/27/24 0959  BP:  122/88 110/66 100/64  Pulse:  85 79   Resp:  18 16 18   Temp:  98.2 F (36.8 C) 98.4 F (36.9 C) (!) 96.3 F (35.7 C)  TempSrc:    Axillary  SpO2:  95% 99% 98%  Weight: 112 kg     Height: 6' (1.829 m)      Eyes: PERRL, lids and conjunctivae normal ENMT: Mucous membranes are moist. Posterior pharynx clear of any exudate or lesions.Normal dentition.  Neck: normal, supple, no masses, no thyromegaly Respiratory: clear to auscultation bilaterally, no wheezing, no crackles. Normal respiratory effort. No accessory muscle use.  Cardiovascular: Regular rate and rhythm, no murmurs / rubs / gallops. No extremity edema. 1+ pedal pulses, right leg below distal one third cool to touch. No carotid bruits.  Abdomen: no tenderness, no  masses palpated. No hepatosplenomegaly. Bowel sounds positive.  Musculoskeletal: no clubbing / cyanosis. No joint deformity upper and lower extremities. Good ROM, no contractures. Normal muscle tone.  Skin: no rashes, lesions, ulcers. No induration Neurologic: CN 2-12 grossly intact. Sensation intact, DTR normal. Strength 5/5 in all 4.  Psychiatric: Normal judgment and insight. Alert and oriented x 3. Normal mood.     Labs on Admission: I have personally reviewed following labs and imaging studies  CBC: Recent Labs  Lab 02/26/24 2347  WBC 8.3  NEUTROABS 4.1  HGB 15.6  HCT 47.8  MCV 95.6  PLT 216   Basic Metabolic Panel: Recent Labs  Lab 02/26/24 2347  NA 136  K 3.5  CL 106  CO2 21*  GLUCOSE 217*  BUN 34*  CREATININE 1.35*  CALCIUM  8.8*   GFR: Estimated Creatinine Clearance: 63 mL/min (A) (by C-G formula based on SCr of 1.35 mg/dL (H)). Liver Function Tests: No results for input(s): AST, ALT, ALKPHOS, BILITOT, PROT, ALBUMIN in the last 168 hours. No results for input(s): LIPASE, AMYLASE in the last 168 hours. No results for input(s): AMMONIA in the last 168 hours. Coagulation Profile: No results for input(s): INR, PROTIME in the last 168 hours. Cardiac Enzymes: No results for input(s): CKTOTAL, CKMB, CKMBINDEX, TROPONINI in the last 168 hours. BNP (last 3 results) No results for input(s): PROBNP in the last 8760 hours. HbA1C: No results for input(s): HGBA1C in the last 72 hours. CBG: No results for input(s): GLUCAP in the last 168 hours. Lipid Profile: No results for input(s): CHOL, HDL, LDLCALC, TRIG, CHOLHDL, LDLDIRECT in the last 72 hours. Thyroid  Function Tests: No results for input(s): TSH, T4TOTAL, FREET4, T3FREE, THYROIDAB in the last 72 hours. Anemia Panel: No results for input(s): VITAMINB12, FOLATE, FERRITIN, TIBC, IRON, RETICCTPCT in the last 72 hours. Urine analysis:    Component  Value Date/Time   COLORURINE STRAW (A) 02/11/2024 1934   APPEARANCEUR CLEAR (A) 02/11/2024 1934   APPEARANCEUR Clear 11/23/2014 2124   LABSPEC 1.025 02/11/2024 1934   LABSPEC 1.025 11/23/2014 2124   PHURINE 5.0 02/11/2024 1934  GLUCOSEU >=500 (A) 02/11/2024 1934   GLUCOSEU >=500 11/23/2014 2124   HGBUR NEGATIVE 02/11/2024 1934   BILIRUBINUR NEGATIVE 02/11/2024 1934   BILIRUBINUR Negative 11/23/2014 2124   KETONESUR NEGATIVE 02/11/2024 1934   PROTEINUR NEGATIVE 02/11/2024 1934   NITRITE NEGATIVE 02/11/2024 1934   LEUKOCYTESUR NEGATIVE 02/11/2024 1934   LEUKOCYTESUR Negative 11/23/2014 2124    Radiological Exams on Admission: US  Venous Img Lower Unilateral Right Result Date: 02/27/2024 EXAM: ULTRASOUND DUPLEX OF THE RIGHT LOWER EXTREMITY VEINS TECHNIQUE: Duplex ultrasound using B-mode/gray scaled imaging and Doppler spectral analysis and color flow was obtained of the deep venous structures of the right lower extremity. COMPARISON: 02/26/2023 CLINICAL HISTORY: RLE swelling. FINDINGS: The visualized veins of the lower extremity are patent and free of echogenic thrombus. The veins demonstrate good compressibility with normal color flow study and spectral analysis. IMPRESSION: 1. No evidence of DVT. Electronically signed by: Pinkie Pebbles MD 02/27/2024 01:56 AM EDT RP Workstation: [id number]    EKG: Independently reviewed.  Sinus rhythm, no acute ST changes.  Assessment/Plan Principal Problem:   Limb ischemia Active Problems:   Atherosclerosis of native arteries of extremity with intermittent claudication (HCC)   Lower limb ischemia  (please populate well all problems here in Problem List. (For example, if patient is on BP meds at home and you resume or decide to hold them, it is a problem that needs to be her. Same for CAD, COPD, HLD and so on)  Acute on chronic right lower extremity ischemia PVD status post left BKA - Etiology likely secondary to noncoherent with antiplatelet  medications and other BP and HLD medications.  Risk of losing right lower extremity is high, patient made aware. - CTA is pending - Discussed with vascular NP at bedside, n.p.o. and angiogram this afternoon. - Vascular surgeon recommended heparin  drip - Resume dual antiplatelet therapy, resume statin  Chronic combined HFrEF and HFpEF HTN - Blood pressure borderline low, hold off on BP meds including Coreg , amlodipine , Lasix , spironolactone  - Start as needed hydralazine   IIDM with hyperglycemia - Hold all p.o. diabetic medications - Start SSI  CKD stage IIIa -Euvolemic -Start IVF for kidney protection from IV contrast  COPD - Stable, no symptoms or signs of acute exacerbation - Continue as needed albuterol  - Continue ICS and LABA  Anxiety/depression - Mentation at baseline, continue SSRI  DVT prophylaxis: Heparin  drip Code Status: Full code Family Communication: None at bedside Disposition Plan: Patient is sick with acute on chronic limb ischemia requiring systemic anticoagulation and emergency inpatient vascular surgeon consultation, expect more than 2 midnight hospital stay Consults called: Vascular surgery Admission status: PCU admit   Cort ONEIDA Mana MD [facility] Pager [phone]  02/27/2024, 10:22 AM

## 2024-02-27 NOTE — Plan of Care (Signed)

## 2024-02-27 NOTE — H&P (View-Only) (Signed)
 Hospital Consult    Reason for Consult:  Right lower extremity Ischemia. Requesting Physician:  Dr Clotilda Samples MD MRN #:  [id number]  History of Present Illness: This is a 73 y.o. male  male past medical history significant for PAD, diabetes, left BKA, presents to the emergency department with right leg pain.  Patient states that he has been having ongoing issues with his right leg but today it acutely worsened.  Patient endorses being non compliant with all anticoagulation medication such as ASA, plavix  or Eliquis. Patient endorses he has no idea what medications he is to be taking. Patient was last seen by vascular surgery on 07/02/23 in the office for leg swelling. Vascular Surgery consulted to evaluate.   Past Medical History:  Diagnosis Date   CAD (coronary artery disease)    a. 01/2010 PCI of LAD; b. 09/2014 PCI/DES of mLAD due to ISR. D1 80, D2 80(jailed), LCX 4m; c. 07/2016 NSTEMI/Cath: LM nl, LAD 74m ISR, 40d, D1 90ost, 80p, D2 90ost, RI min irregs, LCX min irregs, OM1 90 small, OM2/3 min irregs, RCA min irregs, RPLB1 90, EF 25-35%.   Cellulitis of left foot 11/09/2020   Chronic combined systolic and diastolic CHF (congestive heart failure) (HCC)    a. 07/2016 Echo: EF 30%, severe septal/anterior HK, Gr1 DD.   CKD (chronic kidney disease), stage III (HCC)    COPD (chronic obstructive pulmonary disease) (HCC)    DM2 (diabetes mellitus, type 2) (HCC)    Erectile dysfunction    HLD (hyperlipidemia)    HTN (hypertension)    Ischemic cardiomyopathy    a. 07/2016 Echo: EF 30% w/ sev septal/ant HK. Gr1 DD.    Past Surgical History:  Procedure Laterality Date   AMPUTATION Left 11/10/2020   Procedure: AMPUTATION BELOW KNEE;  Surgeon: Marea Selinda RAMAN, MD;  Location: [facility];  Service: General;  Laterality: Left;   BELOW KNEE LEG AMPUTATION     CAD: stent to the LAD     CARDIAC CATHETERIZATION  10/01/2014   CARDIAC CATHETERIZATION N/A 07/23/2016   Procedure: Left Heart Cath and Coronary  Angiography;  Surgeon: Deatrice DELENA Cage, MD;  Location: [facility];  Service: Cardiovascular;  Laterality: N/A;   CORONARY ANGIOPLASTY WITH STENT PLACEMENT  10/01/2014   LOWER EXTREMITY ANGIOGRAPHY Left 10/31/2020   Procedure: LOWER EXTREMITY ANGIOGRAPHY;  Surgeon: Marea Selinda RAMAN, MD;  Location: [facility];  Service: Cardiovascular;  Laterality: Left;   LOWER EXTREMITY ANGIOGRAPHY Right 11/20/2021   Procedure: Lower Extremity Angiography;  Surgeon: Marea Selinda RAMAN, MD;  Location: [facility];  Service: Cardiovascular;  Laterality: Right;    Allergies  Allergen Reactions   Metformin  And Related Diarrhea    If he takes it twice a day it causes diarhea, so he reduced to once daily    Prior to Admission medications   Medication Sig Start Date End Date Taking? Authorizing Provider  acetaminophen  (TYLENOL ) 325 MG tablet Take 2 tablets (650 mg total) by mouth every 4 (four) hours as needed for mild pain (or temp > 37.5 C (99.5 F)). 10/10/21   Raenelle Coria, MD  albuterol  (PROVENTIL ) (2.5 MG/3ML) 0.083% nebulizer solution Take 3 mLs (2.5 mg total) by nebulization every 6 (six) hours as needed for wheezing or shortness of breath. 09/30/20   Maribeth Camellia MATSU, MD  albuterol  (VENTOLIN  HFA) 108 (90 Base) MCG/ACT inhaler Inhale 2 puffs into the lungs every 6 (six) hours as needed for wheezing or shortness of breath. 02/18/23  Claudene Rover, MD  amLODipine  (NORVASC ) 10 MG tablet TAKE 1 TABLET BY MOUTH ONCE DAILY 01/28/24   Rilla Baller, MD  aspirin  EC 81 MG tablet Take 1 tablet (81 mg total) by mouth daily. 02/25/23   Maribeth Camellia MATSU, MD  atorvastatin  (LIPITOR ) 80 MG tablet Take 1 tablet (80 mg total) by mouth daily. 01/27/24   Rilla Baller, MD  carvedilol  (COREG ) 25 MG tablet Take 1 tablet (25 mg total) by mouth 2 (two) times daily with a meal. 01/27/24   Rilla Baller, MD  clopidogrel  (PLAVIX ) 75 MG tablet TAKE 1 TABLET BY MOUTH ONCE DAILY 12/18/23   Marea Selinda RAMAN, MD   Continuous Glucose Receiver (FREESTYLE LIBRE 2 READER) DEVI 1 Device by Does not apply route 4 (four) times daily. 05/14/23   Maribeth Camellia MATSU, MD  Continuous Glucose Sensor (FREESTYLE LIBRE 2 SENSOR) MISC Check blood sugars 03/25/23   Maribeth Camellia MATSU, MD  empagliflozin  (JARDIANCE ) 25 MG TABS tablet Take 1 tablet (25 mg total) by mouth daily. Patient not taking: Reported on 02/24/2024 01/27/24   Rilla Baller, MD  escitalopram  (LEXAPRO ) 10 MG tablet Take 1 tablet (10 mg total) by mouth daily. 01/27/24   Rilla Baller, MD  furosemide  (LASIX ) 20 MG tablet Take 1 tablet (20 mg total) by mouth daily as needed for edema. 12/27/23 03/26/24  Lorene Lesley CROME, PA-C  Insulin  Pen Needle (ULTRACARE PEN NEEDLES) 32G X 6 MM MISC USE AS DIRECTED. TWICE DAILY 06/21/23   Maribeth Camellia MATSU, MD  nitroGLYCERIN  (NITROSTAT ) 0.4 MG SL tablet Place 0.4 mg under the tongue every 5 (five) minutes as needed for chest pain.    [provider]  spironolactone  (ALDACTONE ) 25 MG tablet Take 1 tablet (25 mg total) by mouth daily. 01/27/24   Rilla Baller, MD  tamsulosin  (FLOMAX ) 0.4 MG CAPS capsule TAKE 1 CAPSULE BY MOUTH ONCE DAILY 01/28/24   Rilla Baller, MD  tirzepatide  (MOUNJARO ) 7.5 MG/0.5ML Pen Inject 7.5 mg into the skin once a week. 02/13/24 05/13/24  Rilla Baller, MD  umeclidinium-vilanterol (ANORO ELLIPTA ) 62.5-25 MCG/ACT AEPB Inhale 1 puff into the lungs daily. Patient taking differently: Inhale 1 puff into the lungs daily. Per pt, takes prn 01/27/24   Rilla Baller, MD    Social History   Socioeconomic History   Marital status: Divorced    Spouse name: Not on file   Number of children: Not on file   Years of education: Not on file   Highest education level: Not on file  Occupational History   Not on file  Tobacco Use   Smoking status: Former    Current packs/day: 0.00    Types: Cigarettes    Start date: 09/07/2009    Quit date: 09/07/2009    Years since quitting: 14.4    Smokeless tobacco: Never   Tobacco comments:    quit june 2011  Vaping Use   Vaping status: Never Used  Substance and Sexual Activity   Alcohol use: Not Currently    Alcohol/week: 7.0 standard drinks of alcohol    Types: 6 Cans of beer, 1 Standard drinks or equivalent per week    Comment: last week 07/26/2022   Drug use: Never   Sexual activity: Not Currently  Other Topics Concern   Not on file  Social History Narrative   divorced   Social Drivers of Health   Financial Resource Strain: Low Risk  (04/24/2023)   Overall Financial Resource Strain (CARDIA)    Difficulty of Paying Living Expenses: Not hard  at all  Food Insecurity: No Food Insecurity (04/24/2023)   Hunger Vital Sign    Worried About Running Out of Food in the Last Year: Never true    Ran Out of Food in the Last Year: Never true  Transportation Needs: No Transportation Needs (04/24/2023)   PRAPARE - Administrator, Civil Service (Medical): No    Lack of Transportation (Non-Medical): No  Physical Activity: Inactive (04/24/2023)   Exercise Vital Sign    Days of Exercise per Week: 0 days    Minutes of Exercise per Session: 0 min  Stress: Stress Concern Present (04/24/2023)   [employer] of Occupational Health - Occupational Stress Questionnaire    Feeling of Stress : To some extent  Social Connections: Socially Isolated (04/24/2023)   Social Connection and Isolation Panel    Frequency of Communication with Friends and Family: More than three times a week    Frequency of Social Gatherings with Friends and Family: More than three times a week    Attends Religious Services: Never    Database administrator or Organizations: No    Attends Banker Meetings: Never    Marital Status: Divorced  Catering manager Violence: Not At Risk (04/24/2023)   Humiliation, Afraid, Rape, and Kick questionnaire    Fear of Current or Ex-Partner: No    Emotionally Abused: No    Physically Abused: No    Sexually  Abused: No     Family History  Problem Relation Age of Onset   Heart attack Father        complications   Cancer Brother     ROS: Otherwise negative unless mentioned in HPI  Physical Examination  Vitals:   02/26/24 2343 02/27/24 0525  BP: 122/88 110/66  Pulse: 85 79  Resp: 18 16  Temp: 98.2 F (36.8 C) 98.4 F (36.9 C)  SpO2: 95% 99%   Body mass index is 33.49 kg/m.  General:  WDWN in NAD Gait: Not observed HENT: WNL, normocephalic Pulmonary: normal non-labored breathing, without Rales, rhonchi,  wheezing Cardiac: regular, without  Murmurs, rubs or gallops; without carotid bruits Abdomen: Positive bowel sounds throughout, soft, NT/ND, no masses Skin: without rashes Vascular Exam/Pulses: Left BKA, Right doppler DP no PT pulse found. Leg is warm to touch to the ankle but foot is cool with sluggish cap refill.  Extremities: without ischemic changes, without Gangrene , without cellulitis; without open wounds;  Musculoskeletal: no muscle wasting or atrophy  Neurologic: A&O X 3;  No focal weakness or paresthesias are detected; speech is fluent/normal Psychiatric:  The pt has Normal affect. Lymph:  Unremarkable  CBC    Component Value Date/Time   WBC 8.3 02/26/2024 2347   RBC 5.00 02/26/2024 2347   HGB 15.6 02/26/2024 2347   HGB 15.1 11/23/2014 2006   HCT 47.8 02/26/2024 2347   HCT 46.1 11/23/2014 2006   PLT 216 02/26/2024 2347   PLT 229 11/23/2014 2006   MCV 95.6 02/26/2024 2347   MCV 93 11/23/2014 2006   MCH 31.2 02/26/2024 2347   MCHC 32.6 02/26/2024 2347   RDW 13.0 02/26/2024 2347   RDW 12.7 11/23/2014 2006   LYMPHSABS 2.8 02/26/2024 2347   MONOABS 1.0 02/26/2024 2347   EOSABS 0.3 02/26/2024 2347   BASOSABS 0.0 02/26/2024 2347    BMET    Component Value Date/Time   NA 136 02/26/2024 2347   NA 141 03/01/2023 1555   NA 135 11/23/2014 2006   K 3.5 02/26/2024  2347   K 4.0 11/23/2014 2006   CL 106 02/26/2024 2347   CL 100 (L) 11/23/2014 2006   CO2 21  (L) 02/26/2024 2347   CO2 24 11/23/2014 2006   GLUCOSE 217 (H) 02/26/2024 2347   GLUCOSE 370 (H) 11/23/2014 2006   BUN 34 (H) 02/26/2024 2347   BUN 29 (H) 03/01/2023 1555   BUN 40 (H) 11/23/2014 2006   CREATININE 1.35 (H) 02/26/2024 2347   CREATININE 2.16 (H) 11/23/2014 2006   CALCIUM  8.8 (L) 02/26/2024 2347   CALCIUM  8.6 (L) 11/23/2014 2006   GFRNONAA 55 (L) 02/26/2024 2347   GFRNONAA 31 (L) 11/23/2014 2006   GFRAA 37 (L) 12/24/2019 1608   GFRAA 36 (L) 11/23/2014 2006    COAGS: Lab Results  Component Value Date   INR 1.1 06/30/2022   INR 1.1 09/30/2021   INR 1.3 (H) 11/10/2020     Non-Invasive Vascular Imaging:   none  Statin:  Yes.   Beta Blocker:  Yes.   Aspirin :  Yes.   ACEI:  No. ARB:  No. CCB use:  Yes Other antiplatelets/anticoagulants:  Yes.   Plavix  75 mg daily.   Patient endorses he has been non compliant with all the above listed medications     ASSESSMENT/PLAN: This is a 73 y.o. male who is well known to vascular surgery. Patient has had a prior left BKA and known PAD. He has HX of 3 stents placed to his right lower extremity. He endorses not being compliant with his medications. He presents to the emergency room with increased right lower extremity pain. On exam his leg is warm to touch and has a doppler DP pulse but no PT pulse. His foot is cool but not cold. Sluggish cap refill.   Vascular Surgery plans on taking the patient to the vascular lab later today for a right lower extremity angiogram with possible intervention. I discussed at the bedside in the emergency department in detail the procedure, benefits, risks and complications. He verbalized his understanding and wishes to proceed. I answered all his questions today. Patient was started on a heparin  infusion while in the ER. Patient endorses he has been NPO since yesterday morning    I discussed the case with Dr Selinda Gu MD and he agrees with the plan.    Gwendlyn JONELLE [facility] 02/27/2024 9:45 AM

## 2024-02-27 NOTE — Consult Note (Signed)
 PHARMACY - ANTICOAGULATION CONSULT NOTE  Pharmacy Consult for Heparin  infusion Indication: limb ischemia  Allergies  Allergen Reactions   Metformin  And Related Diarrhea    If he takes it twice a day it causes diarhea, so he reduced to once daily   Patient Measurements: Height: 6' (182.9 cm) Weight: 112 kg (246 lb 14.6 oz) IBW/kg (Calculated) : 77.6 HEPARIN  DW (KG): 101.5  Vital Signs: Temp: 96.3 F (35.7 C) (07/24 0959) Temp Source: Axillary (07/24 0959) BP: 100/64 (07/24 0959) Pulse Rate: 79 (07/24 0525)  Labs: Recent Labs    02/26/24 2347 02/27/24 1050  HGB 15.6  --   HCT 47.8  --   PLT 216  --   APTT  --  32  LABPROT  --  15.3*  INR  --  1.2  CREATININE 1.35*  --     Estimated Creatinine Clearance: 63 mL/min (A) (by C-G formula based on SCr of 1.35 mg/dL (H)).   Medical History: Past Medical History:  Diagnosis Date   CAD (coronary artery disease)    a. 01/2010 PCI of LAD; b. 09/2014 PCI/DES of mLAD due to ISR. D1 80, D2 80(jailed), LCX 30m; c. 07/2016 NSTEMI/Cath: LM nl, LAD 15m ISR, 40d, D1 90ost, 80p, D2 90ost, RI min irregs, LCX min irregs, OM1 90 small, OM2/3 min irregs, RCA min irregs, RPLB1 90, EF 25-35%.   Cellulitis of left foot 11/09/2020   Chronic combined systolic and diastolic CHF (congestive heart failure) (HCC)    a. 07/2016 Echo: EF 30%, severe septal/anterior HK, Gr1 DD.   CKD (chronic kidney disease), stage III (HCC)    COPD (chronic obstructive pulmonary disease) (HCC)    DM2 (diabetes mellitus, type 2) (HCC)    Erectile dysfunction    HLD (hyperlipidemia)    HTN (hypertension)    Ischemic cardiomyopathy    a. 07/2016 Echo: EF 30% w/ sev septal/ant HK. Gr1 DD.    Medications:  No AC prior to admission  Assessment: PMH includes PVD s/p left BKA, HTN, chronic combined HFrEF and HFpEF, CKD stage IIIa, COPD, IIDM, HLD, medication noncompliance. Patient presents to ED with worsening of right leg pain, swelling and discoloration. Pharmacy  consult to initiate and manage heparin  infusion for limb ischemia.  Baseline labs appropriate to initiate heparin  infusion. No AC prior to admission either.  Goal of Therapy:  Heparin  level 0.3-0.7 units/ml Monitor platelets by anticoagulation protocol: Yes   Plan:  Give 6000 units bolus x 1 Start heparin  infusion at 1500 units/hr Check heparin  level in 8 hours and daily while on heparin  Continue to monitor H&H and platelets  Tanashia Ciesla Rodriguez-Guzman PharmD, BCPS 02/27/2024 11:32 AM

## 2024-02-27 NOTE — ED Notes (Signed)
 There has been a delay in getting the pt on the monitor due to the hallway monitor not pulling over properly for CCMD to see, Vertell made aware.

## 2024-02-27 NOTE — ED Notes (Signed)
 Pulse found in right foot with doppler.

## 2024-02-27 NOTE — Interval H&P Note (Signed)
 History and Physical Interval Note:  02/27/2024 1:54 PM  Danny HERO Mancebo Sr.  has presented today for surgery, with the diagnosis of Right lower extremity ischemia.  The various methods of treatment have been discussed with the patient and family. After consideration of risks, benefits and other options for treatment, the patient has consented to  Procedure(s): Lower Extremity Angiography (Right) as a surgical intervention.  The patient's history has been reviewed, patient examined, no change in status, stable for surgery.  I have reviewed the patient's chart and labs.  Questions were answered to the patient's satisfaction.     Avonne Berkery

## 2024-02-27 NOTE — ED Provider Notes (Signed)
 [name] Provider Note    Event Date/Time   First MD Initiated Contact with Patient 02/27/24 [phone]     (approximate)   History   Leg Swelling   HPI  Danny [name] Sr. is a 73 y.o. male past medical history significant for PAD, diabetes, left BKA, presents to the emergency department with right leg pain.  Patient states that he has been having ongoing issues with his right leg but today it acutely worsened.  Felt like his right leg was cold and turning purple.  Denies fever or chills.  States that this is similar to prior episodes whenever he needed an amputation to his left leg.  States that his pain is improved since laying down.  Denies falls or trauma.  No fever or chills.  No shortness of breath or chest pain     Physical Exam   Triage Vital Signs: ED Triage Vitals  Encounter Vitals Group     BP 02/26/24 2343 122/88     Girls Systolic BP Percentile --      Girls Diastolic BP Percentile --      Boys Systolic BP Percentile --      Boys Diastolic BP Percentile --      Pulse Rate 02/26/24 2343 85     Resp 02/26/24 2343 18     Temp 02/26/24 2343 98.2 F (36.8 C)     Temp src --      SpO2 02/26/24 2343 95 %     Weight 02/26/24 2342 246 lb 14.6 oz (112 kg)     Height 02/26/24 2342 6' (1.829 m)     Head Circumference --      Peak Flow --      Pain Score 02/26/24 2342 4     Pain Loc --      Pain Education --      Exclude from Growth Chart --     Most recent vital signs: Vitals:   02/26/24 2343 02/27/24 0525  BP: 122/88 110/66  Pulse: 85 79  Resp: 18 16  Temp: 98.2 F (36.8 C) 98.4 F (36.9 C)  SpO2: 95% 99%    Physical Exam Constitutional:      Appearance: He is well-developed.  HENT:     Head: Atraumatic.  Eyes:     Conjunctiva/sclera: Conjunctivae normal.  Cardiovascular:     Rate and Rhythm: Regular rhythm.  Pulmonary:     Effort: No respiratory distress.  Musculoskeletal:     Cervical back: Normal range of motion.      Comments: Right foot is cool to touch.  Does have capillary refill.  Dopplerable pulse DP.  No obvious wound.  BKA to the left leg  Skin:    General: Skin is warm.     Capillary Refill: Capillary refill takes less than 2 seconds.  Neurological:     Mental Status: He is alert. Mental status is at baseline.     IMPRESSION / MDM / ASSESSMENT AND PLAN / ED COURSE  I reviewed the triage vital signs and the nursing notes.  Differential diagnosis including peripheral arterial disease, cellulitis, peripheral neuropathy  EKG  I, Clotilda Punter, the attending physician, personally viewed and interpreted this ECG.   Rate: Normal  Rhythm: Normal sinus  Axis: Normal  Intervals: Normal  ST&T Change: None  No tachycardic or bradycardic dysrhythmias while on cardiac telemetry.  RADIOLOGY CTA runoff pending LABS (all labs ordered are listed, but only abnormal results are displayed)  Labs interpreted as -    Labs Reviewed  BASIC METABOLIC PANEL WITH GFR - Abnormal; Notable for the following components:      Result Value   CO2 21 (*)    Glucose, Bld 217 (*)    BUN 34 (*)    Creatinine, Ser 1.35 (*)    Calcium  8.8 (*)    GFR, Estimated 55 (*)    All other components within normal limits  CBC WITH DIFFERENTIAL/PLATELET     MDM  Creatinine appears to be at his baseline.  Glucose 217 no concerns for DKA with normal anion gap at 9.  No significant leukocytosis  Ultrasound DVT was negative.  CTA pending.  Care transferred to incoming provider, clinical picture is most concerning for peripheral arterial disease.  Not on anticoagulation.  Does have a DP pulse with doppler.     PROCEDURES:  Critical Care performed: No  Procedures  Patient's presentation is most consistent with acute presentation with potential threat to life or bodily function.   MEDICATIONS ORDERED IN ED: Medications  iohexol  (OMNIPAQUE ) 350 MG/ML injection 125 mL (125 mLs Intravenous Contrast Given 02/27/24 0535)     FINAL CLINICAL IMPRESSION(S) / ED DIAGNOSES   Final diagnoses:  Right leg pain     Rx / DC Orders   ED Discharge Orders     None        Note:  This document was prepared using Dragon voice recognition software and may include unintentional dictation errors.   Suzanne Kirsch, MD 02/27/24 [phone]

## 2024-02-27 NOTE — Op Note (Signed)
 [facility]  Percutaneous Study/Intervention Procedural Note   Date of Surgery: 02/27/2024  Surgeon(s):Kymber Kosar    Assistants:none  Pre-operative Diagnosis: PAD with rest pain, acute on chronic ischemia right lower extremity with thrombosis below previous interventions  Post-operative diagnosis:  Same  Procedure(s) Performed:             1.  Ultrasound guidance for vascular access left femoral artery             2.  Catheter placement into right common femoral artery from left femoral approach             3.  Aortogram and selective right lower extremity angiogram             4.  Mechanical thrombectomy to the right popliteal artery and tibioperoneal trunk with the penumbra CAT 6 device             5.  Percutaneous transluminal angioplasty of the right peroneal artery and tibioperoneal trunk with 3 mm diameter by 15 cm length angioplasty balloon  6.  Percutaneous transluminal angioplasty of the right popliteal artery with 5 mm diameter by 15 cm length angioplasty balloon             7.  StarClose closure device left femoral artery  EBL: 50 cc  Contrast: 55 cc  Fluoro Time: 4.8 minutes  Moderate Conscious Sedation Time: approximately 52 minutes using 2 mg of Versed  and 50 mcg of Fentanyl               Indications:  Patient is a 73 y.o.male with an acute on chronic right lower extremity ischemia with rest pain.  He had a CT scan suggesting thrombosis below his previous intervention sites.  The patient is brought in for angiography for further evaluation and potential treatment.  Due to the limb threatening nature of the situation, angiogram was performed for attempted limb salvage. The patient is aware that if the procedure fails, amputation would be expected.  The patient also understands that even with successful revascularization, amputation may still be required due to the severity of the situation.  Risks and benefits are discussed and informed consent is  obtained.   Procedure:  The patient was identified and appropriate procedural time out was performed.  The patient was then placed supine on the table and prepped and draped in the usual sterile fashion. Moderate conscious sedation was administered during a face to face encounter with the patient throughout the procedure with my supervision of the RN administering medicines and monitoring the patient's vital signs, pulse oximetry, telemetry and mental status throughout from the start of the procedure until the patient was taken to the recovery room. Ultrasound was used to evaluate the left common femoral artery.  It was patent .  A digital ultrasound image was acquired.  A Seldinger needle was used to access the left common femoral artery under direct ultrasound guidance and a permanent image was performed.  A 0.035 J wire was advanced without resistance and a 5Fr sheath was placed.  Pigtail catheter was placed into the aorta and an AP aortogram was performed. This demonstrated normal renal arteries and normal aorta and iliac segments without significant stenosis. I then crossed the aortic bifurcation and advanced to the right femoral head. Selective right lower extremity angiogram was then performed. This demonstrated the right common femoral artery was patent as was the SFA.  The previously placed stent in the above-knee popliteal artery was actually patent, but  just below the stent there was an abrupt occlusion consistent with thrombosis.  Image quality was extremely poor due to continuous patient motion but there appeared to be reconstitution of the mid peroneal artery distally and this was likely the only runoff. It was felt that it was in the patient's best interest to proceed with intervention after these images to avoid a second procedure and a larger amount of contrast and fluoroscopy based off of the findings from the initial angiogram. The patient was systemically heparinized and a 6 French 70 cm Ansell  sheath was then placed over the [employer] wire. I then used a CXI catheter and the 0.014 advantage wire to easily navigate through the thrombotic occlusion in the right popliteal artery, tibioperoneal trunk, and peroneal artery confirming intraluminal flow in the mid peroneal artery.  The 0.014 advantage wire was then replaced and mechanical thrombectomy was performed due to the extensive thrombosis.  2 passes were made with the penumbra CAT 6 device in the right popliteal artery and tibioperoneal trunk.  Thrombus was removed but there remained very sluggish flow distally.  I then performed angioplasty first of the peroneal artery and the tibioperoneal trunk with a 3 mm diameter by 15 cm length angioplasty balloon inflated to 8 atm for 1 minute.  A 5 mm diameter by 15 cm length angioplasty balloon was then inflated in the right popliteal artery and taken up to 8 atm for 1 minute.  Completion imaging showed brisk inline flow with complete removal of the thrombus with thrombectomy device and resolution of the stenosis with the angioplasty with less than 20% residual stenosis throughout the right popliteal artery, tibioperoneal trunk, and peroneal artery with brisk flow through the peroneal artery distally as the only runoff. I elected to terminate the procedure. The sheath was removed and StarClose closure device was deployed in the left femoral artery with excellent hemostatic result. The patient was taken to the recovery room in stable condition having tolerated the procedure well.  Findings:               Aortogram:  This demonstrated normal renal arteries and normal aorta and iliac segments without significant stenosis.             Right Lower Extremity:  This demonstrated the right common femoral artery was patent as was the SFA.  The previously placed stent in the above-knee popliteal artery was actually patent, but just below the stent there was an abrupt occlusion consistent with thrombosis.  Image  quality was extremely poor due to continuous patient motion but there appeared to be reconstitution of the mid peroneal artery distally and this was likely the only runoff   Disposition: Patient was taken to the recovery room in stable condition having tolerated the procedure well.  Complications: None  Selinda Gu 02/27/2024 3:12 PM   This note was created with Dragon Medical transcription system. Any errors in dictation are purely unintentional.

## 2024-02-28 ENCOUNTER — Encounter: Payer: Self-pay | Admitting: Vascular Surgery

## 2024-02-28 DIAGNOSIS — I70211 Atherosclerosis of native arteries of extremities with intermittent claudication, right leg: Secondary | ICD-10-CM | POA: Diagnosis not present

## 2024-02-28 DIAGNOSIS — M79604 Pain in right leg: Secondary | ICD-10-CM | POA: Diagnosis not present

## 2024-02-28 DIAGNOSIS — I998 Other disorder of circulatory system: Secondary | ICD-10-CM | POA: Diagnosis not present

## 2024-02-28 LAB — HEPARIN LEVEL (UNFRACTIONATED): Heparin Unfractionated: 0.95 [IU]/mL — ABNORMAL HIGH (ref 0.30–0.70)

## 2024-02-28 LAB — CBC
HCT: 46.3 % (ref 39.0–52.0)
Hemoglobin: 15.5 g/dL (ref 13.0–17.0)
MCH: 31.4 pg (ref 26.0–34.0)
MCHC: 33.5 g/dL (ref 30.0–36.0)
MCV: 93.7 fL (ref 80.0–100.0)
Platelets: 195 K/uL (ref 150–400)
RBC: 4.94 MIL/uL (ref 4.22–5.81)
RDW: 12.7 % (ref 11.5–15.5)
WBC: 8.7 K/uL (ref 4.0–10.5)
nRBC: 0 % (ref 0.0–0.2)

## 2024-02-28 LAB — GLUCOSE, CAPILLARY
Glucose-Capillary: 237 mg/dL — ABNORMAL HIGH (ref 70–99)
Glucose-Capillary: 154 mg/dL — ABNORMAL HIGH (ref 70–99)

## 2024-02-28 MED ORDER — ENOXAPARIN SODIUM 60 MG/0.6ML IJ SOSY: 0.5000 mg/kg | PREFILLED_SYRINGE | INTRAMUSCULAR | Status: DC

## 2024-02-28 MED ORDER — ACETAMINOPHEN 325 MG PO TABS: 650.0000 mg | ORAL_TABLET | ORAL | Status: DC | PRN

## 2024-02-28 NOTE — Plan of Care (Signed)
 Problem: Education: Goal: Ability to describe self-care measures that may prevent or decrease complications (Diabetes Survival Skills Education) will improve 02/28/2024 1515 by Arloa Dene KIDD, RN Outcome: Adequate for Discharge 02/28/2024 1229 by Arloa Dene KIDD, RN Outcome: Progressing Goal: Individualized Educational Video(s) 02/28/2024 1515 by Arloa Dene KIDD, RN Outcome: Adequate for Discharge 02/28/2024 1229 by Arloa Dene KIDD, RN Outcome: Progressing   Problem: Coping: Goal: Ability to adjust to condition or change in health will improve 02/28/2024 1515 by Arloa Dene KIDD, RN Outcome: Adequate for Discharge 02/28/2024 1229 by Arloa Dene KIDD, RN Outcome: Progressing   Problem: Fluid Volume: Goal: Ability to maintain a balanced intake and output will improve 02/28/2024 1515 by Arloa Dene KIDD, RN Outcome: Adequate for Discharge 02/28/2024 1229 by Arloa Dene KIDD, RN Outcome: Progressing   Problem: Health Behavior/Discharge Planning: Goal: Ability to identify and utilize available resources and services will improve 02/28/2024 1515 by Arloa Dene KIDD, RN Outcome: Adequate for Discharge 02/28/2024 1229 by Arloa Dene KIDD, RN Outcome: Progressing Goal: Ability to manage health-related needs will improve 02/28/2024 1515 by Arloa Dene KIDD, RN Outcome: Adequate for Discharge 02/28/2024 1229 by Arloa Dene KIDD, RN Outcome: Progressing   Problem: Metabolic: Goal: Ability to maintain appropriate glucose levels will improve 02/28/2024 1515 by Arloa Dene KIDD, RN Outcome: Adequate for Discharge 02/28/2024 1229 by Arloa Dene KIDD, RN Outcome: Progressing   Problem: Nutritional: Goal: Maintenance of adequate nutrition will improve 02/28/2024 1515 by Arloa Dene KIDD, RN Outcome: Adequate for Discharge 02/28/2024 1229 by Arloa Dene KIDD, RN Outcome: Progressing Goal: Progress toward achieving an optimal weight will improve 02/28/2024 1515  by Arloa Dene KIDD, RN Outcome: Adequate for Discharge 02/28/2024 1229 by Arloa Dene KIDD, RN Outcome: Progressing   Problem: Skin Integrity: Goal: Risk for impaired skin integrity will decrease 02/28/2024 1515 by Arloa Dene KIDD, RN Outcome: Adequate for Discharge 02/28/2024 1229 by Arloa Dene KIDD, RN Outcome: Progressing   Problem: Tissue Perfusion: Goal: Adequacy of tissue perfusion will improve 02/28/2024 1515 by Arloa Dene KIDD, RN Outcome: Adequate for Discharge 02/28/2024 1229 by Arloa Dene KIDD, RN Outcome: Progressing   Problem: Education: Goal: Knowledge of General Education information will improve Description: Including pain rating scale, medication(s)/side effects and non-pharmacologic comfort measures 02/28/2024 1515 by Arloa Dene KIDD, RN Outcome: Adequate for Discharge 02/28/2024 1229 by Arloa Dene KIDD, RN Outcome: Progressing   Problem: Health Behavior/Discharge Planning: Goal: Ability to manage health-related needs will improve 02/28/2024 1515 by Arloa Dene KIDD, RN Outcome: Adequate for Discharge 02/28/2024 1229 by Arloa Dene KIDD, RN Outcome: Progressing   Problem: Clinical Measurements: Goal: Ability to maintain clinical measurements within normal limits will improve 02/28/2024 1515 by Arloa Dene KIDD, RN Outcome: Adequate for Discharge 02/28/2024 1229 by Arloa Dene KIDD, RN Outcome: Progressing Goal: Will remain free from infection 02/28/2024 1515 by Arloa Dene KIDD, RN Outcome: Adequate for Discharge 02/28/2024 1229 by Arloa Dene KIDD, RN Outcome: Progressing Goal: Diagnostic test results will improve 02/28/2024 1515 by Arloa Dene KIDD, RN Outcome: Adequate for Discharge 02/28/2024 1229 by Arloa Dene KIDD, RN Outcome: Progressing Goal: Respiratory complications will improve 02/28/2024 1515 by Arloa Dene KIDD, RN Outcome: Adequate for Discharge 02/28/2024 1229 by Arloa Dene KIDD, RN Outcome:  Progressing Goal: Cardiovascular complication will be avoided 02/28/2024 1515 by Arloa Dene KIDD, RN Outcome: Adequate for Discharge 02/28/2024 1229 by Arloa Dene KIDD, RN Outcome: Progressing   Problem: Activity: Goal: Risk for activity intolerance will decrease 02/28/2024 1515 by Arloa Dene KIDD, RN Outcome: Adequate for Discharge  02/28/2024 1229 by Arloa Dene KIDD, RN Outcome: Progressing   Problem: Nutrition: Goal: Adequate nutrition will be maintained 02/28/2024 1515 by Arloa Dene KIDD, RN Outcome: Adequate for Discharge 02/28/2024 1229 by Arloa Dene KIDD, RN Outcome: Progressing   Problem: Coping: Goal: Level of anxiety will decrease 02/28/2024 1515 by Arloa Dene KIDD, RN Outcome: Adequate for Discharge 02/28/2024 1229 by Arloa Dene KIDD, RN Outcome: Progressing   Problem: Elimination: Goal: Will not experience complications related to bowel motility 02/28/2024 1515 by Arloa Dene KIDD, RN Outcome: Adequate for Discharge 02/28/2024 1229 by Arloa Dene KIDD, RN Outcome: Progressing Goal: Will not experience complications related to urinary retention 02/28/2024 1515 by Arloa Dene KIDD, RN Outcome: Adequate for Discharge 02/28/2024 1229 by Arloa Dene KIDD, RN Outcome: Progressing   Problem: Pain Managment: Goal: General experience of comfort will improve and/or be controlled 02/28/2024 1515 by Arloa Dene KIDD, RN Outcome: Adequate for Discharge 02/28/2024 1229 by Arloa Dene KIDD, RN Outcome: Progressing   Problem: Safety: Goal: Ability to remain free from injury will improve 02/28/2024 1515 by Arloa Dene KIDD, RN Outcome: Adequate for Discharge 02/28/2024 1229 by Arloa Dene KIDD, RN Outcome: Progressing   Problem: Skin Integrity: Goal: Risk for impaired skin integrity will decrease 02/28/2024 1515 by Arloa Dene KIDD, RN Outcome: Adequate for Discharge 02/28/2024 1229 by Arloa Dene KIDD, RN Outcome: Progressing

## 2024-02-28 NOTE — Care Management Important Message (Signed)
 Important Message  Patient Details  Name: Danny PACI Sr. MRN: [id number] Date of Birth: Mar 05, 1951   Important Message Given:  Yes - Medicare IM     Danny [name] 02/28/2024, 1:26 PM

## 2024-02-28 NOTE — Progress Notes (Signed)
 Progress Note    02/28/2024 8:24 AM 1 Day Post-Op  Subjective:  Lamar Rummer Sr. Is a 73 yo male who is now POD #1 from:  Procedure(s) Performed:             1.  Ultrasound guidance for vascular access left femoral artery             2.  Catheter placement into right common femoral artery from left femoral approach             3.  Aortogram and selective right lower extremity angiogram             4.  Mechanical thrombectomy to the right popliteal artery and tibioperoneal trunk with the penumbra CAT 6 device             5.  Percutaneous transluminal angioplasty of the right peroneal artery and tibioperoneal trunk with 3 mm diameter by 15 cm length angioplasty balloon             6.  Percutaneous transluminal angioplasty of the right popliteal artery with 5 mm diameter by 15 cm length angioplasty balloon             7.  StarClose closure device left femoral artery  Patient is resting comfortably in bed this morning. No complications overnight. No complaints. Vitals all remain stable.    Vitals:   02/27/24 2356 02/28/24 0339  BP: (!) 147/89 (!) 154/91  Pulse: 83 84  Resp: 18 18  Temp: (!) 97.4 F (36.3 C) 97.6 F (36.4 C)  SpO2: 95% 95%   Physical Exam: Cardiac:  RRR, Normal S1, S2. No murmurs.  Lungs: Clear on auscultation throughout, no rales rhonchi or wheezing noted. Incisions: Left groin puncture incision.  Dressing clean dry and intact.  No hematoma seroma noted. Extremities: Right lower extremity warm to touch with positive Doppler DP and PT pulses.  Good capillary refill. Abdomen: Positive bowel sounds throughout, soft, nontender and nondistended. Neurologic: Alert and oriented x 3, answers all questions and follows commands appropriately.  CBC    Component Value Date/Time   WBC 8.7 02/28/2024 0101   RBC 4.94 02/28/2024 0101   HGB 15.5 02/28/2024 0101   HGB 15.1 11/23/2014 2006   HCT 46.3 02/28/2024 0101   HCT 46.1 11/23/2014 2006   PLT 195 02/28/2024 0101    PLT 229 11/23/2014 2006   MCV 93.7 02/28/2024 0101   MCV 93 11/23/2014 2006   MCH 31.4 02/28/2024 0101   MCHC 33.5 02/28/2024 0101   RDW 12.7 02/28/2024 0101   RDW 12.7 11/23/2014 2006   LYMPHSABS 2.8 02/26/2024 2347   MONOABS 1.0 02/26/2024 2347   EOSABS 0.3 02/26/2024 2347   BASOSABS 0.0 02/26/2024 2347    BMET    Component Value Date/Time   NA 136 02/26/2024 2347   NA 141 03/01/2023 1555   NA 135 11/23/2014 2006   K 3.5 02/26/2024 2347   K 4.0 11/23/2014 2006   CL 106 02/26/2024 2347   CL 100 (L) 11/23/2014 2006   CO2 21 (L) 02/26/2024 2347   CO2 24 11/23/2014 2006   GLUCOSE 217 (H) 02/26/2024 2347   GLUCOSE 370 (H) 11/23/2014 2006   BUN 34 (H) 02/26/2024 2347   BUN 29 (H) 03/01/2023 1555   BUN 40 (H) 11/23/2014 2006   CREATININE 1.35 (H) 02/26/2024 2347   CREATININE 2.16 (H) 11/23/2014 2006   CALCIUM  8.8 (L) 02/26/2024 2347   CALCIUM  8.6 (L) 11/23/2014 2006  GFRNONAA 55 (L) 02/26/2024 2347   GFRNONAA 31 (L) 11/23/2014 2006   GFRAA 37 (L) 12/24/2019 1608   GFRAA 36 (L) 11/23/2014 2006    INR    Component Value Date/Time   INR 1.2 02/27/2024 1050     Intake/Output Summary (Last 24 hours) at 02/28/2024 0824 Last data filed at 02/28/2024 0339 Gross per 24 hour  Intake 1089.69 ml  Output 425 ml  Net 664.69 ml     Assessment/Plan:  73 y.o. male is s/p See Above 1 Day Post-Op   PLAN Stop heparin  infusion this morning. Start patient on aspirin  81 mg daily Plavix  75 mg daily. Per vascular surgery okay for patient be discharged as soon as medically stable. Patient to follow-up in vein and vascular surgery clinic as scheduled.   DVT prophylaxis:  Heparin  Infusion Converted to DAPT   Gwendlyn JONELLE [facility] 02/28/2024 8:24 AM

## 2024-02-28 NOTE — Progress Notes (Signed)
 Discharge education provided, patient voiced understanding and has no further questions. AVS printed and given to patient. Ivs removed with catheters intact, no site complications. Awaiting EMS

## 2024-02-28 NOTE — Consult Note (Signed)
 PHARMACY - ANTICOAGULATION CONSULT NOTE  Pharmacy Consult for Heparin  infusion Indication: limb ischemia  Allergies  Allergen Reactions   Metformin  And Related Diarrhea    If he takes it twice a day it causes diarhea, so he reduced to once daily   Patient Measurements: Height: 6' (182.9 cm) Weight: 112 kg (246 lb 14.6 oz) IBW/kg (Calculated) : 77.6 HEPARIN  DW (KG): 101.5  Vital Signs: Temp: 97.4 F (36.3 C) (07/24 2356) Temp Source: Oral (07/24 2356) BP: 147/89 (07/24 2356) Pulse Rate: 83 (07/24 2356)  Labs: Recent Labs    02/26/24 2347 02/27/24 1050 02/28/24 0101  HGB 15.6  --  15.5  HCT 47.8  --  46.3  PLT 216  --  195  APTT  --  32  --   LABPROT  --  15.3*  --   INR  --  1.2  --   HEPARINUNFRC  --   --  0.95*  CREATININE 1.35*  --   --     Estimated Creatinine Clearance: 63 mL/min (A) (by C-G formula based on SCr of 1.35 mg/dL (H)).   Medical History: Past Medical History:  Diagnosis Date   CAD (coronary artery disease)    a. 01/2010 PCI of LAD; b. 09/2014 PCI/DES of mLAD due to ISR. D1 80, D2 80(jailed), LCX 68m; c. 07/2016 NSTEMI/Cath: LM nl, LAD 70m ISR, 40d, D1 90ost, 80p, D2 90ost, RI min irregs, LCX min irregs, OM1 90 small, OM2/3 min irregs, RCA min irregs, RPLB1 90, EF 25-35%.   Cellulitis of left foot 11/09/2020   Chronic combined systolic and diastolic CHF (congestive heart failure) (HCC)    a. 07/2016 Echo: EF 30%, severe septal/anterior HK, Gr1 DD.   CKD (chronic kidney disease), stage III (HCC)    COPD (chronic obstructive pulmonary disease) (HCC)    DM2 (diabetes mellitus, type 2) (HCC)    Erectile dysfunction    HLD (hyperlipidemia)    HTN (hypertension)    Ischemic cardiomyopathy    a. 07/2016 Echo: EF 30% w/ sev septal/ant HK. Gr1 DD.    Medications:  No AC prior to admission  Assessment: PMH includes PVD s/p left BKA, HTN, chronic combined HFrEF and HFpEF, CKD stage IIIa, COPD, IIDM, HLD, medication noncompliance. Patient presents to  ED with worsening of right leg pain, swelling and discoloration. Pharmacy consult to initiate and manage heparin  infusion for limb ischemia.  Baseline labs appropriate to initiate heparin  infusion. No AC prior to admission either.  Goal of Therapy:  Heparin  level 0.3-0.7 units/ml Monitor platelets by anticoagulation protocol: Yes  07/25 0101 HL 0.95, supratherapeutic   Plan:  Decrease heparin  infusion to 1300 units/hr Recheck heparin  level in 8 hours after rate change if not D/C prior to procedure Continue to monitor H&H and platelets  Rankin CANDIE Dills, PharmD, [facility] 02/28/2024 2:08 AM

## 2024-02-28 NOTE — TOC Transition Note (Signed)
 Transition of Care [facility]) - Discharge Note   Patient Details  Name: Danny MOLLICA Sr. MRN: [id number] Date of Birth: 1950/08/31  [facility] CM/SW Contact:  Adaiah Jaskot C Aracelys Glade, RN Phone Number: 02/28/2024, 2:52 PM   Clinical Narrative:    Spoke with patient regarding discharge home. Patient  advised EMS has been arranged. He stated A lady is present in  the home waiting for his arrival.   EMS arranged He's second on the list.   Nurse provided with the details.    TOC signing off.           Patient Goals and CMS Choice            Discharge Placement                       Discharge Plan and Services Additional resources added to the After Visit Summary for                                       Social Drivers of Health (SDOH) Interventions SDOH Screenings   Food Insecurity: Patient Declined (02/27/2024)  Housing: Patient Declined (02/27/2024)  Transportation Needs: Patient Declined (02/27/2024)  Utilities: Patient Declined (02/27/2024)  Alcohol Screen: Low Risk  (04/24/2023)  Depression (PHQ2-9): Low Risk  (01/27/2024)  Financial Resource Strain: Low Risk  (04/24/2023)  Physical Activity: Inactive (04/24/2023)  Social Connections: Unknown (02/27/2024)  Stress: Stress Concern Present (04/24/2023)  Tobacco Use: Medium Risk (02/26/2024)  Health Literacy: Inadequate Health Literacy (07/25/2023)     Readmission Risk Interventions     No data to display

## 2024-02-28 NOTE — Plan of Care (Signed)

## 2024-02-28 NOTE — Discharge Summary (Signed)
 Physician Discharge Summary   Patient: Danny LOFLIN Sr. MRN: [id number] DOB: 1951/07/07  Admit date:     02/27/2024  Discharge date: 02/28/24  Discharge Physician: Carliss LELON Canales   PCP: Rilla Baller, MD   Recommendations at discharge:    Pt to be discharged home.   If you experience worsening fever, chills, chest pain, shortness of breath, or other concerning symptoms, please call your PCP or go to the emergency department immediately.  Discharge Diagnoses: Principal Problem:   Limb ischemia Active Problems:   Atherosclerosis of native arteries of extremity with intermittent claudication (HCC)   Lower limb ischemia   Right leg pain  Resolved Problems:   * No resolved hospital problems. *   Hospital Course:   73 y.o. male with medical history significant of PVD s/p left BKA, HTN, chronic combined HFrEF and HFpEF, CKD stage IIIa, COPD, IIDM, HLD, medication noncompliance, presented with worsening of right leg pain, swelling and discoloration.   Symptoms has been started  few weeks ago, patient started noticed worsening of claudication with right lower extremity, with pain and discoloration and cool to touch.  Last 2 days symptoms as getting worse.  He however also reported that he has been only taking his medications  here and there and unsure has been taking aspirin  and Plavix  every day.  Denied any chest pain, no fever or chills.  Assessment and Plan:  Acute on chronic right lower extremity ischemia - Followed closely by vascular surgery.   POD #1 from:   Procedure(s) Performed:             1.  Ultrasound guidance for vascular access left femoral artery             2.  Catheter placement into right common femoral artery from left femoral approach             3.  Aortogram and selective right lower extremity angiogram             4.  Mechanical thrombectomy to the right popliteal artery and tibioperoneal trunk with the penumbra CAT 6 device             5.   Percutaneous transluminal angioplasty of the right peroneal artery and tibioperoneal trunk with 3 mm diameter by 15 cm length angioplasty balloon             6.  Percutaneous transluminal angioplasty of the right popliteal artery with 5 mm diameter by 15 cm length angioplasty balloon             7.  StarClose closure device left femoral artery  Transition patient from heparin  to DAPT with aspirin  and Plavix .  Patient to follow-up with regularly scheduled vein and vascular surgery clinic appointment.    Consultants: Vascular surgery Procedures performed: See above Disposition: Home Diet recommendation:  Discharge Diet Orders (From admission, onward)     Start     Ordered   02/28/24 0000  Diet - low sodium heart healthy        02/28/24 1243           Cardiac and Carb modified diet  DISCHARGE MEDICATION: Allergies as of 02/28/2024       Reactions   Metformin  And Related Diarrhea   If he takes it twice a day it causes diarhea, so he reduced to once daily        Medication List     TAKE these medications    acetaminophen  325 MG  tablet Commonly known as: TYLENOL  Take 2 tablets (650 mg total) by mouth every 4 (four) hours as needed for mild pain (or temp > 37.5 C (99.5 F)).   albuterol  (2.5 MG/3ML) 0.083% nebulizer solution Commonly known as: PROVENTIL  Take 3 mLs (2.5 mg total) by nebulization every 6 (six) hours as needed for wheezing or shortness of breath.   albuterol  108 (90 Base) MCG/ACT inhaler Commonly known as: VENTOLIN  HFA Inhale 2 puffs into the lungs every 6 (six) hours as needed for wheezing or shortness of breath.   amLODipine  10 MG tablet Commonly known as: NORVASC  TAKE 1 TABLET BY MOUTH ONCE DAILY   aspirin  EC 81 MG tablet Take 1 tablet (81 mg total) by mouth daily.   atorvastatin  80 MG tablet Commonly known as: LIPITOR  Take 1 tablet (80 mg total) by mouth daily.   carvedilol  25 MG tablet Commonly known as: COREG  Take 1 tablet (25 mg total) by  mouth 2 (two) times daily with a meal.   clopidogrel  75 MG tablet Commonly known as: PLAVIX  TAKE 1 TABLET BY MOUTH ONCE DAILY   empagliflozin  25 MG Tabs tablet Commonly known as: Jardiance  Take 1 tablet (25 mg total) by mouth daily.   escitalopram  10 MG tablet Commonly known as: LEXAPRO  Take 1 tablet (10 mg total) by mouth daily.   FreeStyle Libre 2 Reader Devi 1 Device by Does not apply route 4 (four) times daily.   FreeStyle Libre 2 Sensor Misc Check blood sugars   furosemide  20 MG tablet Commonly known as: LASIX  Take 1 tablet (20 mg total) by mouth daily as needed for edema.   Mounjaro  7.5 MG/0.5ML Pen Generic drug: tirzepatide  Inject 7.5 mg into the skin once a week.   nitroGLYCERIN  0.4 MG SL tablet Commonly known as: NITROSTAT  Place 0.4 mg under the tongue every 5 (five) minutes as needed for chest pain.   spironolactone  25 MG tablet Commonly known as: ALDACTONE  Take 1 tablet (25 mg total) by mouth daily.   tamsulosin  0.4 MG Caps capsule Commonly known as: FLOMAX  TAKE 1 CAPSULE BY MOUTH ONCE DAILY   Ultracare Pen Needles 32G X 6 MM Misc Generic drug: Insulin  Pen Needle USE AS DIRECTED. TWICE DAILY   umeclidinium-vilanterol 62.5-25 MCG/ACT Aepb Commonly known as: ANORO ELLIPTA  Inhale 1 puff into the lungs daily. What changed: additional instructions        Follow-up Information     [name], Danny E, NP Follow up in 6 week(s).   Specialty: Vascular Surgery Why: Right Lower Extremity Arterial Duplex U/s With ABI Contact information: [address] [place] [address] [phone]                 Discharge Exam: Danny [name]   02/26/24 2342  Weight: 112 kg    GENERAL:  Alert, pleasant, no acute distress  HEENT:  EOMI CARDIOVASCULAR:  RRR, no murmurs appreciated RESPIRATORY:  Clear to auscultation, no wheezing, rales, or rhonchi GASTROINTESTINAL:  Soft, nontender, nondistended EXTREMITIES: RLE warm with good cap  refill NEURO:  No new focal deficits appreciated SKIN: Bandage over left groin  PSYCH:  Appropriate mood and affect     Condition at discharge: improving  The results of significant diagnostics from this hospitalization (including imaging, microbiology, ancillary and laboratory) are listed below for reference.   Imaging Studies: PERIPHERAL VASCULAR CATHETERIZATION Result Date: 02/27/2024 See surgical note for result.  CT ANGIO AO+BIFEM W & OR WO CONTRAST Result Date: 02/27/2024 CLINICAL DATA:  Right foot and lower  extremity edema and claudication. History of diabetes, peripheral vascular disease and prior left below-knee amputation. EXAM: CT ANGIOGRAPHY OF ABDOMINAL AORTA WITH ILIOFEMORAL RUNOFF TECHNIQUE: Multidetector CT imaging of the abdomen, pelvis and lower extremities was performed using the standard protocol during bolus administration of intravenous contrast. Multiplanar CT image reconstructions and MIPs were obtained to evaluate the vascular anatomy. RADIATION DOSE REDUCTION: This exam was performed according to the departmental dose-optimization program which includes automated exposure control, adjustment of the mA and/or kV according to patient size and/or use of iterative reconstruction technique. CONTRAST:  OMNIPAQUE  IOHEXOL  350 MG/ML SOLN COMPARISON:  None Available. FINDINGS: VASCULAR Aorta: Atherosclerosis of the abdominal aorta without aneurysm or dissection. Celiac: Normally patent. Normally patent branch vessels and branching anatomy. SMA: Normally patent. Renals: Normally patent bilateral single renal arteries. IMA: Normally patent. RIGHT Lower Extremity Inflow: Atherosclerosis of common, internal and external iliac arteries without significant stenosis or aneurysmal disease. Outflow: Atherosclerosis of common femoral artery without significant stenosis. Patent femoral bifurcation. Profunda femoral artery is patent. Superficial femoral artery demonstrates diffuse disease  without high-grade stenosis. Maximum degree of stenosis just before the adductor hiatus approaches approximately 60% in caliber. Previously placed stent extending from the distal SFA into the proximal popliteal artery is normally patent. Below the stent, there is chronic occlusion of the popliteal artery beginning at the level of the knee joint and extending below the knee over a roughly 7.5 cm segment. Runoff: Very poor reconstitution of flow in right tibial arteries below the knee which may be mostly secondary to slow flow and lack of visualized collateral reconstitution. There appears to be some degree of reconstitution of peroneal flow distally. Anterior tibial and posterior tibial arteries are not definitely reconstituted distally by CTA. LEFT Lower Extremity Inflow: Atherosclerosis of common, internal and external iliac arteries without significant stenosis or aneurysmal disease. Outflow: Patent common femoral artery with atherosclerosis. Disease profunda femoral artery. Widely patent proximal SFA stent. Native SFA distal to the stent demonstrates diffuse disease with single area high-grade stenosis in the mid SFA approaching 75-80% in estimated caliber. Poor opacification of the native popliteal artery and proximal tibial arteries proximal to the below-knee amputation site. This may be secondary to slow flow. Runoff: See above. Review of the MIP images confirms the above findings. NON-VASCULAR Lower chest: Respiratory motion.  Probable underlying emphysema. Hepatobiliary: No focal liver abnormality is seen. No gallstones, gallbladder wall thickening, or biliary dilatation. Pancreas: Unremarkable. No pancreatic ductal dilatation or surrounding inflammatory changes. Spleen: Normal in size without focal abnormality. Adrenals/Urinary Tract: No adrenal masses. Both kidneys demonstrate cortical atrophy. No hydronephrosis. Bosniak 1 cyst of the left upper kidney measuring up to 2.7 cm and requiring no follow-up. The  bladder is unremarkable. Stomach/Bowel: Bowel shows no evidence of obstruction, ileus, inflammation or lesion. The appendix is normal. No free intraperitoneal air. Lymphatic: No enlarged lymph nodes identified in the abdomen or pelvis. Reproductive: Prostate is unremarkable. Other: No abdominal wall hernia or abnormality. No abdominopelvic ascites. Musculoskeletal: No acute or significant osseous findings. IMPRESSION: 1. Widely patent stent extending from the distal right SFA into the proximal right popliteal artery. 2. Chronic occlusion of the right popliteal artery beginning at the level of the knee joint and extending below the knee over a roughly 7.5 cm segment. 3. Very poor reconstitution of flow in right tibial arteries below the knee which may be mostly secondary to slow flow and lack of visualized collateral reconstitution. There appears to be some degree of reconstitution of peroneal flow distally.  Anterior tibial and posterior tibial arteries are not definitely reconstituted distally by CTA. 4. Diffuse disease of the right superficial femoral artery without high-grade stenosis. Maximum degree of stenosis just before the adductor hiatus approaches approximately 60% in caliber. 5. Widely patent proximal left SFA stent. Native SFA distal to the stent demonstrates diffuse disease with single area high-grade stenosis in the mid SFA approaching 75-80% in estimated caliber. 6. Poor opacification of the native left popliteal artery and proximal tibial arteries proximal to the below-knee amputation site. This may be secondary to slow flow. 7. Non-vascular: Bilateral renal cortical atrophy. 8. Probable underlying emphysema. Electronically Signed   By: Marcey Moan M.D.   On: 02/27/2024 11:19   US  Venous Img Lower Unilateral Right Result Date: 02/27/2024 EXAM: ULTRASOUND DUPLEX OF THE RIGHT LOWER EXTREMITY VEINS TECHNIQUE: Duplex ultrasound using B-mode/gray scaled imaging and Doppler spectral analysis and  color flow was obtained of the deep venous structures of the right lower extremity. COMPARISON: 02/26/2023 CLINICAL HISTORY: RLE swelling. FINDINGS: The visualized veins of the lower extremity are patent and free of echogenic thrombus. The veins demonstrate good compressibility with normal color flow study and spectral analysis. IMPRESSION: 1. No evidence of DVT. Electronically signed by: Pinkie Pebbles MD 02/27/2024 01:56 AM EDT RP Workstation: [id number]    Microbiology: Results for orders placed or performed during the hospital encounter of 01/28/24  Urine Culture     Status: Abnormal   Collection Time: 01/28/24  8:00 PM   Specimen: Urine, Random  Result Value Ref Range Status   Specimen Description   Final    URINE, RANDOM Performed at [facility], [address], [place], [place] [address]    Special Requests   Final    NONE Reflexed from [id number] Performed at [facility], [address] [place], [place] [address]    Culture (A)  Final    60,000 COLONIES/mL ESCHERICHIA COLI WITHIN MIXED ORGANISMS Performed at [facility], [address], [place], [place] [address]    Report Status 01/30/2024 FINAL  Final   Organism ID, Bacteria ESCHERICHIA COLI (A)  Final      Susceptibility   Escherichia coli - MIC*    AMPICILLIN  <=2 SENSITIVE Sensitive     CEFAZOLIN  <=4 SENSITIVE Sensitive     CEFEPIME  <=0.12 SENSITIVE Sensitive     CEFTRIAXONE  <=0.25 SENSITIVE Sensitive     CIPROFLOXACIN <=0.25 SENSITIVE Sensitive     GENTAMICIN <=1 SENSITIVE Sensitive     IMIPENEM <=0.25 SENSITIVE Sensitive     NITROFURANTOIN <=16 SENSITIVE Sensitive     TRIMETH /SULFA  <=20 SENSITIVE Sensitive     AMPICILLIN /SULBACTAM <=2 SENSITIVE Sensitive     PIP/TAZO <=4 SENSITIVE Sensitive ug/mL    * 60,000 COLONIES/mL ESCHERICHIA COLI    Labs: CBC: Recent Labs  Lab 02/26/24 2347 02/28/24 0101  WBC 8.3 8.7  NEUTROABS 4.1  --   HGB 15.6 15.5  HCT 47.8 46.3  MCV 95.6 93.7  PLT  216 195   Basic Metabolic Panel: Recent Labs  Lab 02/26/24 2347  NA 136  K 3.5  CL 106  CO2 21*  GLUCOSE 217*  BUN 34*  CREATININE 1.35*  CALCIUM  8.8*   Liver Function Tests: No results for input(s): AST, ALT, ALKPHOS, BILITOT, PROT, ALBUMIN in the last 168 hours. CBG: Recent Labs  Lab 02/27/24 1116 02/27/24 1711 02/27/24 2110 02/28/24 0954 02/28/24 1223  GLUCAP 143* 138* 158* 237* 154*    Discharge time spent: 38 minutes.  Length of inpatient  stay: 1 days  Signed: Carliss LELON Canales, DO [facility] 02/28/2024

## 2024-03-03 ENCOUNTER — Emergency Department
Admission: EM | Admit: 2024-03-03 | Discharge: 2024-03-03 | Disposition: A | Discharge status: Home / Self Care | Attending: Emergency Medicine | Admitting: Emergency Medicine

## 2024-03-03 ENCOUNTER — Other Ambulatory Visit: Payer: Self-pay

## 2024-03-03 DIAGNOSIS — E119 Type 2 diabetes mellitus without complications: Secondary | ICD-10-CM | POA: Insufficient documentation

## 2024-03-03 DIAGNOSIS — Z89512 Acquired absence of left leg below knee: Secondary | ICD-10-CM | POA: Diagnosis not present

## 2024-03-03 DIAGNOSIS — R29898 Other symptoms and signs involving the musculoskeletal system: Secondary | ICD-10-CM | POA: Diagnosis not present

## 2024-03-03 DIAGNOSIS — Z743 Need for continuous supervision: Secondary | ICD-10-CM | POA: Diagnosis not present

## 2024-03-03 DIAGNOSIS — I2510 Atherosclerotic heart disease of native coronary artery without angina pectoris: Secondary | ICD-10-CM | POA: Insufficient documentation

## 2024-03-03 DIAGNOSIS — I5032 Chronic diastolic (congestive) heart failure: Secondary | ICD-10-CM | POA: Diagnosis not present

## 2024-03-03 DIAGNOSIS — I110 Hypertensive heart disease with heart failure: Secondary | ICD-10-CM | POA: Insufficient documentation

## 2024-03-03 DIAGNOSIS — R5383 Other fatigue: Secondary | ICD-10-CM | POA: Diagnosis not present

## 2024-03-03 DIAGNOSIS — T801XXA Vascular complications following infusion, transfusion and therapeutic injection, initial encounter: Secondary | ICD-10-CM

## 2024-03-03 DIAGNOSIS — Z7401 Bed confinement status: Secondary | ICD-10-CM | POA: Diagnosis not present

## 2024-03-03 DIAGNOSIS — S81801A Unspecified open wound, right lower leg, initial encounter: Secondary | ICD-10-CM | POA: Diagnosis not present

## 2024-03-03 DIAGNOSIS — Z4801 Encounter for change or removal of surgical wound dressing: Secondary | ICD-10-CM | POA: Diagnosis not present

## 2024-03-03 DIAGNOSIS — X58XXXA Exposure to other specified factors, initial encounter: Secondary | ICD-10-CM | POA: Insufficient documentation

## 2024-03-03 DIAGNOSIS — Z5189 Encounter for other specified aftercare: Secondary | ICD-10-CM

## 2024-03-03 MED ORDER — DOXYCYCLINE HYCLATE 100 MG PO TABS
100.0000 mg | ORAL_TABLET | Freq: Once | ORAL | Status: AC
  Administered 2024-03-03: 100 mg via ORAL
  Filled 2024-03-03: qty 1

## 2024-03-03 MED ORDER — DOXYCYCLINE HYCLATE 100 MG PO TABS: 100.0000 mg | ORAL_TABLET | Freq: Two times a day (BID) | ORAL | 0 refills | Status: AC

## 2024-03-03 NOTE — ED Provider Notes (Signed)
 [facility] Provider Note    Event Date/Time   First MD Initiated Contact with Patient 03/03/24 1651     (approximate)   History   Wound Check   HPI Danny [name] Sr. is a 73 y.o. male  with a past medical history of type 2 diabetes, hypertension, CAD, GERD, chronic diastolic congestive heart failure, diabetic neuropathy, left BKA presents to the emergency department with wound on the right anterior lower leg x4 days.  Patient also endorses warmth and erythema.  States it has been draining pus.  Denies numbness, tingling, falls, trauma, fever, chills, SOB, chest pain.  Patient does have a family member at home who helps take care of him.  He has prior known right lower limb ischemia, atherosclerosis and follows vascular surgery.   He had several procedures performed on 7/25 from vascular surgery including 2 percutaneous transluminal angioplasties with balloon placement, mechanical thrombectomy to the right popliteal artery.     Physical Exam   Triage Vital Signs: ED Triage Vitals  Encounter Vitals Group     BP 03/03/24 1625 111/68     Girls Systolic BP Percentile --      Girls Diastolic BP Percentile --      Boys Systolic BP Percentile --      Boys Diastolic BP Percentile --      Pulse Rate 03/03/24 1625 94     Resp 03/03/24 1625 18     Temp 03/03/24 1624 98.4 F (36.9 C)     Temp Source 03/03/24 1624 Oral     SpO2 03/03/24 1625 93 %     Weight 03/03/24 1650 246 lb 14.6 oz (112 kg)     Height 03/03/24 1650 6' (1.829 m)     Head Circumference --      Peak Flow --      Pain Score 03/03/24 1622 0     Pain Loc --      Pain Education --      Exclude from Growth Chart --     Most recent vital signs: Vitals:   03/03/24 1624 03/03/24 1625  BP:  111/68  Pulse:  94  Resp:  18  Temp: 98.4 F (36.9 C)   SpO2:  93%    General: Awake, in no acute distress. Appears stated age. Head: Normocephalic, atraumatic. CV: Regular rate, 94 bpm. Dorsalis  pedis pulse 1+ on the right. Mild edema to right lower leg. Respiratory: No respiratory distress. Normal respiratory effort. MSK: Left BKA. No pain with palpation of the right lower leg. Skin: Warm, dry. Quarter-sized ~1.5 cm open wound w/ minimal erythema; wound is through the epidermis and dermis in right anterior lower leg. No bleeding or pus-like discharge. Neurological: A&Ox4 to person, place, time, and situation.   ED Results / Procedures / Treatments   Labs (all labs ordered are listed, but only abnormal results are displayed) Labs Reviewed - No data to display   EKG    RADIOLOGY    PROCEDURES:  Critical Care performed: No   Procedures   MEDICATIONS ORDERED IN ED: Medications  doxycycline  (VIBRA -TABS) tablet 100 mg (100 mg Oral Given 03/03/24 1741)     IMPRESSION / MDM / ASSESSMENT AND PLAN / ED COURSE  I reviewed the triage vital signs and the nursing notes.                              Differential diagnosis includes,  but is not limited to, reperfusion injury, stasis dermatitis, cellulitis, abscess  Patient's presentation is most consistent with acute, uncomplicated illness.  Patient is a 73 year old male who presented today with wound to the right anterior lower leg.  He has no fever, chills, pain in this area.  There is a small open wound with no bleeding or discharge.  Patient had recent angiography performed on 7/25 and this wound appeared afterwards.  Discussed this patient with my supervising physician, Dr. Franky Moores, who believes this is possibly a reperfusion type injury following the procedure.  Discussed providing precautionary antibiotics.  Patient was provided with 1 dose of doxycycline  here.  Remainder of the antibiotic will be sent to his pharmacy of choice.  Patient will be transferred home.  He has a follow-up with his vascular surgeon scheduled in 6 weeks.  He can also follow-up with his primary care provider, or in the emergency department  for any new, worsening, or concerning symptoms.  Patient was given the opportunity to ask questions; all questions were answered. Emergency department return precautions were discussed with the patient.  Patient is in agreement to the treatment plan.  Patient is stable for discharge.    FINAL CLINICAL IMPRESSION(S) / ED DIAGNOSES   Final diagnoses:  Visit for wound check  Ischemic reperfusion injury     Rx / DC Orders   ED Discharge Orders          Ordered    doxycycline  (VIBRA -TABS) 100 MG tablet  2 times daily        03/03/24 1733             Note:  This document was prepared using Dragon voice recognition software and may include unintentional dictation errors.     Sheron Salm, PA-C 03/03/24 1857    Moores Franky, MD 03/03/24 2130

## 2024-03-03 NOTE — ED Triage Notes (Signed)
 Pt recently had angiography done which involved right leg. Pt states wound developed from that. Wound is quarter sized, open skin w/o bleeding, and on front of lower leg. Pt denies pain. States PCP told him to come in for it.

## 2024-03-03 NOTE — ED Triage Notes (Signed)
 First Nurse Note: Patient to ED via [facility] from home for a wound check to right shin. Red and warm to touch. Unsure how long its been there- just noticed today. VS WNL. Hx: left BKA

## 2024-03-03 NOTE — ED Notes (Signed)
 Dinner tray given to pt

## 2024-03-03 NOTE — ED Provider Notes (Signed)
-----------------------------------------   5:41 PM on 03/03/2024 -----------------------------------------  I have personally seen and evaluated the patient in conjunction with physicians assistant [facility].  Patient has a small wound approximately 1.5 cm in diameter his right lower extremity with very minimal surrounding erythema.  Right lower extremity is warm throughout with a 1+ palpable DP pulse.  Very mild wound possibly due to recent angiography possible reperfusion type injury.  Will cover with antibiotics as a precaution.  Patient will follow-up with his doctor.   Dorothyann Drivers, MD 03/03/24 1742

## 2024-03-03 NOTE — ED Notes (Signed)
 See triage note  Presents with an open area to right lower leg  Afebrile on arrival  Denies any specific injury but noticed area today

## 2024-03-03 NOTE — Discharge Instructions (Addendum)
 You have been seen today in the Emergency Department (ED) for a wound recheck. Please take your antibiotics as prescribed for their ENTIRE prescribed duration.  Take Tylenol  as needed for pain, but only as written on the box.   Please follow up with your doctor or in the ED in 24-48 hours for recheck of your wound if you are not improving.  Call your doctor sooner or return to the ED if you develop worsening signs of infection such as: increased redness, increased pain, pus, fever, or other symptoms that concern you.

## 2024-03-09 ENCOUNTER — Other Ambulatory Visit: Payer: Self-pay | Admitting: Family Medicine

## 2024-03-10 ENCOUNTER — Telehealth: Payer: Self-pay | Admitting: *Deleted

## 2024-03-10 ENCOUNTER — Encounter: Payer: Self-pay | Admitting: Family Medicine

## 2024-03-10 NOTE — Telephone Encounter (Signed)
 Patient has not established care with me yet. I have never evaluated him. His appointment with me is not till 04/29/2024. Will take over refills once he is established with me otherwise please send refill request to previous doctor who last saw the patient.   Thank you,  Luke Shade, MD

## 2024-03-10 NOTE — Progress Notes (Unsigned)
 Complex Care Management Note Care Guide Note  03/10/2024 Name: Danny Girgenti Parson Sr. MRN: [id number] DOB: 1950/10/28   Complex Care Management Outreach Attempts: An unsuccessful telephone outreach was attempted today to offer the patient information about available complex care management services.  Follow Up Plan:  Additional outreach attempts will be made to offer the patient complex care management information and services.   Encounter Outcome:  No Answer  Thedford Franks, CMA [place]  [facility], [facility] Guide Direct Dial : [phone]  Fax: [phone] Website: [email or web address]

## 2024-03-10 NOTE — Telephone Encounter (Signed)
 Tried calling patient to schedule a medication refill appointment, no answer, no voice mail. Tried calling son, he also does not have voice mail. Called ex daughter in law [place] and she stated she would text a message to patient's son to call and schedule appointment.

## 2024-03-11 NOTE — Progress Notes (Unsigned)
 Complex Care Management Note Care Guide Note  03/11/2024 Name: Danny Dunshee Rion Sr. MRN: [id number] DOB: [birth year]   Complex Care Management Outreach Attempts: A second unsuccessful outreach was attempted today to offer the patient with information about available complex care management services.  Follow Up Plan:  Additional outreach attempts will be made to offer the patient complex care management information and services.   Encounter Outcome:  No Answer  Thedford Franks, CMA [place]  [facility], [facility] Guide Direct Dial : [phone]  Fax: [phone] Website: [email or web address]

## 2024-03-11 NOTE — Telephone Encounter (Signed)
 Patient was called on 03/11/2024, no voice mail set up. Patient needs to schedule a Transfer of Care and Medication refill. Patient's son was called, no voice mail set up either. Called Daughter-in-law, on patient's DPR. She is no longer married to son, but would send a message to ex husband about father needing appointments.  Second attempt to call patient on 03/11/2024, call would not go through.  MyChart message.

## 2024-03-12 NOTE — Progress Notes (Signed)
 Complex Care Management Note Care Guide Note  03/12/2024 Name: Danny Schnitzer Louque Sr. MRN: [id number] DOB: [birth year]   Complex Care Management Outreach Attempts: A third unsuccessful outreach was attempted today to offer the patient with information about available complex care management services.  Follow Up Plan:  No further outreach attempts will be made at this time. We have been unable to contact the patient to offer or enroll patient in complex care management services.  Encounter Outcome:  No Answer  Danny [name], CMA [place]  [facility], [facility] Guide Direct Dial : [phone]  Fax: [phone] Website: [email or web address]

## 2024-03-16 DIAGNOSIS — R2681 Unsteadiness on feet: Secondary | ICD-10-CM | POA: Diagnosis not present

## 2024-03-16 DIAGNOSIS — H269 Unspecified cataract: Secondary | ICD-10-CM | POA: Diagnosis not present

## 2024-03-16 DIAGNOSIS — I2510 Atherosclerotic heart disease of native coronary artery without angina pectoris: Secondary | ICD-10-CM | POA: Diagnosis not present

## 2024-03-16 DIAGNOSIS — I739 Peripheral vascular disease, unspecified: Secondary | ICD-10-CM | POA: Diagnosis not present

## 2024-03-16 DIAGNOSIS — Z87891 Personal history of nicotine dependence: Secondary | ICD-10-CM | POA: Diagnosis not present

## 2024-03-16 DIAGNOSIS — I130 Hypertensive heart and chronic kidney disease with heart failure and stage 1 through stage 4 chronic kidney disease, or unspecified chronic kidney disease: Secondary | ICD-10-CM | POA: Diagnosis not present

## 2024-03-16 DIAGNOSIS — E1122 Type 2 diabetes mellitus with diabetic chronic kidney disease: Secondary | ICD-10-CM | POA: Diagnosis not present

## 2024-03-16 DIAGNOSIS — J449 Chronic obstructive pulmonary disease, unspecified: Secondary | ICD-10-CM | POA: Diagnosis not present

## 2024-03-16 DIAGNOSIS — I5020 Unspecified systolic (congestive) heart failure: Secondary | ICD-10-CM | POA: Diagnosis not present

## 2024-03-16 DIAGNOSIS — N183 Chronic kidney disease, stage 3 unspecified: Secondary | ICD-10-CM | POA: Diagnosis not present

## 2024-03-18 DIAGNOSIS — I10 Essential (primary) hypertension: Secondary | ICD-10-CM | POA: Diagnosis not present

## 2024-03-18 DIAGNOSIS — I129 Hypertensive chronic kidney disease with stage 1 through stage 4 chronic kidney disease, or unspecified chronic kidney disease: Secondary | ICD-10-CM | POA: Diagnosis not present

## 2024-03-18 DIAGNOSIS — E039 Hypothyroidism, unspecified: Secondary | ICD-10-CM | POA: Diagnosis not present

## 2024-03-26 ENCOUNTER — Encounter: Payer: Self-pay | Admitting: Cardiovascular Disease

## 2024-03-29 NOTE — Progress Notes (Deleted)
 Date:  03/29/2024   ID:  Danny CHRISTELLA Rummer Sr., DOB 07-30-1951, MRN [id number]  Patient Location:  [address] [place] [address]   Provider location:   [facility], [place] office  PCP:  Rilla Baller, MD  Cardiologist:  [facility]   No chief complaint on file.   History of Present Illness:    Danny LUECKE Sr. is a 73 y.o. male  past medical history of smoke 4 packs a day who stopped earlier in 2011,  coronary artery disease,  cardiac catheterization showing severe two-vessel disease,  severe stenosis of the proximal to mid LAD, severe ostial O1 and O2 disease, moderate to severe mid left circumflex disease that appeared aneurysmal, moderate OM 2 disease ejection fraction 40%  PCI of the LAD on January 11, 2010,  Chronic renal insufficiency,   diabetes on insulin , A1C improved hyperlipidemia who presents for routine followup of his coronary artery disease.  LOC 1/24 Last seen by one of our providers May 2025 1 episode of stabbing chest pain that lasted for about a minute before he decided to take sublingual nitroglycerin  and subsequently resolved.   seen in the emergency department 02/2023 for lower extremity edema     PV procedures 3/23 and 4/23 Has had amputation of the left leg  Rare NTG for chest pain, only used once Chronic mild shortness of breath Relatively sedentary, presents in a wheelchair  Labs reviewed A1C 7.4 CR 1.15, bun 37 Normal CBC Total chol 112, LDL 39  EKG personally reviewed by myself on todays visit Normal sinus rhythm rate 93 bpm nonspecific T wave abnormality  Other past echo history reviewed covid positive 07/2019, COVID-19 viral pneumonia  hemoglobin A1c 11.2.   pt oxygen  saturation went down to 88%, CXR: hazy bilateral peripheral opacities Remdesivir  and steroids.  Mother with covid, died   Past Medical History:  Diagnosis Date   CAD (coronary artery disease)    a. 01/2010 PCI of LAD; b.  09/2014 PCI/DES of mLAD due to ISR. D1 80, D2 80(jailed), LCX 55m; c. 07/2016 NSTEMI/Cath: LM nl, LAD 68m ISR, 40d, D1 90ost, 80p, D2 90ost, RI min irregs, LCX min irregs, OM1 90 small, OM2/3 min irregs, RCA min irregs, RPLB1 90, EF 25-35%.   Cellulitis of left foot 11/09/2020   Chronic combined systolic and diastolic CHF (congestive heart failure) (HCC)    a. 07/2016 Echo: EF 30%, severe septal/anterior HK, Gr1 DD.   CKD (chronic kidney disease), stage III (HCC)    COPD (chronic obstructive pulmonary disease) (HCC)    DM2 (diabetes mellitus, type 2) (HCC)    Erectile dysfunction    HLD (hyperlipidemia)    HTN (hypertension)    Ischemic cardiomyopathy    a. 07/2016 Echo: EF 30% w/ sev septal/ant HK. Gr1 DD.   Past Surgical History:  Procedure Laterality Date   AMPUTATION Left 11/10/2020   Procedure: AMPUTATION BELOW KNEE;  Surgeon: Marea Selinda RAMAN, MD;  Location: [facility] ORS;  Service: General;  Laterality: Left;   BELOW KNEE LEG AMPUTATION     CAD: stent to the LAD     CARDIAC CATHETERIZATION  10/01/2014   CARDIAC CATHETERIZATION N/A 07/23/2016   Procedure: Left Heart Cath and Coronary Angiography;  Surgeon: Deatrice DELENA Cage, MD;  Location: [facility];  Service: Cardiovascular;  Laterality: N/A;   CORONARY ANGIOPLASTY WITH STENT PLACEMENT  10/01/2014   LOWER EXTREMITY ANGIOGRAPHY Left 10/31/2020   Procedure: LOWER EXTREMITY ANGIOGRAPHY;  Surgeon: Marea Selinda RAMAN, MD;  Location: [facility];  Service: Cardiovascular;  Laterality: Left;   LOWER EXTREMITY ANGIOGRAPHY Right 11/20/2021   Procedure: Lower Extremity Angiography;  Surgeon: Marea Selinda RAMAN, MD;  Location: [facility];  Service: Cardiovascular;  Laterality: Right;   LOWER EXTREMITY ANGIOGRAPHY Right 02/27/2024   Procedure: Lower Extremity Angiography;  Surgeon: Marea Selinda RAMAN, MD;  Location: [facility];  Service: Cardiovascular;  Laterality: Right;     No outpatient medications have been marked as taking for the  03/30/24 encounter (Appointment) with Casyn Becvar J, MD.     Allergies:   Metformin  and related   Social History   Tobacco Use   Smoking status: Former    Current packs/day: 0.00    Types: Cigarettes    Start date: 09/07/2009    Quit date: 09/07/2009    Years since quitting: 14.5   Smokeless tobacco: Never   Tobacco comments:    quit june 2011  Vaping Use   Vaping status: Never Used  Substance Use Topics   Alcohol use: Not Currently    Alcohol/week: 7.0 standard drinks of alcohol    Types: 6 Cans of beer, 1 Standard drinks or equivalent per week    Comment: last week 07/26/2022   Drug use: Never     Current Outpatient Medications on File Prior to Visit  Medication Sig Dispense Refill   acetaminophen  (TYLENOL ) 325 MG tablet Take 2 tablets (650 mg total) by mouth every 4 (four) hours as needed for mild pain (or temp > 37.5 C (99.5 F)).     albuterol  (PROVENTIL ) (2.5 MG/3ML) 0.083% nebulizer solution Take 3 mLs (2.5 mg total) by nebulization every 6 (six) hours as needed for wheezing or shortness of breath. 150 mL 1   albuterol  (VENTOLIN  HFA) 108 (90 Base) MCG/ACT inhaler Inhale 2 puffs into the lungs every 6 (six) hours as needed for wheezing or shortness of breath. 8 g 2   amLODipine  (NORVASC ) 10 MG tablet TAKE 1 TABLET BY MOUTH ONCE DAILY 90 tablet 0   aspirin  EC 81 MG tablet Take 1 tablet (81 mg total) by mouth daily. 90 tablet 3   atorvastatin  (LIPITOR ) 80 MG tablet Take 1 tablet (80 mg total) by mouth daily. 90 tablet 0   carvedilol  (COREG ) 25 MG tablet Take 1 tablet (25 mg total) by mouth 2 (two) times daily with a meal. 180 tablet 0   clopidogrel  (PLAVIX ) 75 MG tablet TAKE 1 TABLET BY MOUTH ONCE DAILY 30 tablet 5   Continuous Glucose Receiver (FREESTYLE LIBRE 2 READER) DEVI 1 Device by Does not apply route 4 (four) times daily. 1 each 0   Continuous Glucose Sensor (FREESTYLE LIBRE 2 SENSOR) MISC Check blood sugars 6 each 3   empagliflozin  (JARDIANCE ) 25 MG TABS tablet Take  1 tablet (25 mg total) by mouth daily. 90 tablet 0   escitalopram  (LEXAPRO ) 10 MG tablet Take 1 tablet (10 mg total) by mouth daily. 90 tablet 0   furosemide  (LASIX ) 20 MG tablet Take 1 tablet (20 mg total) by mouth daily as needed for edema. 30 tablet 3   Insulin  Pen Needle (ULTRACARE PEN NEEDLES) 32G X 6 MM MISC USE AS DIRECTED. TWICE DAILY 200 each 3   nitroGLYCERIN  (NITROSTAT ) 0.4 MG SL tablet Place 0.4 mg under the tongue every 5 (five) minutes as needed for chest pain.     spironolactone  (ALDACTONE ) 25 MG tablet Take 1 tablet (25 mg total) by mouth daily.  90 tablet 0   tamsulosin  (FLOMAX ) 0.4 MG CAPS capsule TAKE 1 CAPSULE BY MOUTH ONCE DAILY 90 capsule 0   tirzepatide  (MOUNJARO ) 7.5 MG/0.5ML Pen Inject 7.5 mg into the skin once a week. 6 mL 0   umeclidinium-vilanterol (ANORO ELLIPTA ) 62.5-25 MCG/ACT AEPB Inhale 1 puff into the lungs daily. (Patient taking differently: Inhale 1 puff into the lungs daily. Per pt, takes prn) 60 each 3   No current facility-administered medications on file prior to visit.     Family Hx: The patient's family history includes Cancer in his brother; Heart attack in his father.  ROS:   Please see the history of present illness.    Review of Systems  Constitutional: Negative.   HENT: Negative.    Respiratory:  Positive for shortness of breath.   Cardiovascular: Negative.   Gastrointestinal: Negative.   Musculoskeletal:  Positive for back pain and joint pain.  Neurological: Negative.   Psychiatric/Behavioral: Negative.    All other systems reviewed and are negative.     Labs/Other Tests and Data Reviewed:    Recent Labs: 01/28/2024: ALT 26 02/26/2024: BUN 34; Creatinine, Ser 1.35; Potassium 3.5; Sodium 136 02/28/2024: Hemoglobin 15.5; Platelets 195   Recent Lipid Panel Lab Results  Component Value Date/Time   CHOL 136 09/12/2022 04:24 PM   TRIG 103.0 09/12/2022 04:24 PM   HDL 55.70 09/12/2022 04:24 PM   CHOLHDL 2 09/12/2022 04:24 PM   LDLCALC  59 09/12/2022 04:24 PM   LDLDIRECT 64.0 03/20/2018 09:50 AM    Wt Readings from Last 3 Encounters:  03/03/24 246 lb 14.6 oz (112 kg)  02/26/24 246 lb 14.6 oz (112 kg)  02/11/24 246 lb 14.6 oz (112 kg)     Exam:    Vital Signs: Vital signs may also be detailed in the HPI There were no vitals taken for this visit. Constitutional:  oriented to person, place, and time. No distress.  In a wheelchair HENT:  Head: Grossly normal Eyes:  no discharge. No scleral icterus.  Neck: No JVD, no carotid bruits  Cardiovascular: Regular rate and rhythm, no murmurs appreciated Pulmonary/Chest: Clear to auscultation bilaterally, no wheezes or rails Abdominal: Soft.  no distension.  no tenderness.  Musculoskeletal: Normal range of motion Neurological:  normal muscle tone. Coordination normal. No atrophy Skin: Skin warm and dry Psychiatric: normal affect, pleasant   ASSESSMENT & PLAN:    Chronic combined systolic and diastolic CHF (congestive heart failure) (HCC) Appears relatively euvolemic on today's visit Prior ejection fraction May 2000 2235 to 40% Feels his breathing is stable On carvedilol , Jardiance , Imdur , Lasix  He prefers no medication changes  Coronary artery disease of native artery of native heart with stable angina pectoris (HCC) Currently with no symptoms of angina. No further workup at this time. Continue current medication regimen. Diabetes with much better control  Centrilobular emphysema (HCC) Stop smoking many years ago  Essential hypertension Blood pressure is well controlled on today's visit. No changes made to the medications.  Diabetes mellitus type 2, uncontrolled, with complications (HCC) A1c much improved, discussed with him  CKD (chronic kidney disease) stage 3, GFR 30-59 ml/min (HCC) Stable renal function, much improved from November 2023  Pure hypercholesterolemia Cholesterol at goal   Total encounter time more than 30 minutes  Greater than 50% was  spent in counseling and coordination of care with the patient   Signed, Shanayah Kaffenberger, MD  03/29/2024 6:19 PM    [facility] Office [address], [place], [place] [address]

## 2024-03-30 ENCOUNTER — Ambulatory Visit: Attending: Cardiovascular Disease | Admitting: Cardiovascular Disease

## 2024-03-30 DIAGNOSIS — Z7985 Long-term (current) use of injectable non-insulin antidiabetic drugs: Secondary | ICD-10-CM | POA: Diagnosis not present

## 2024-03-30 DIAGNOSIS — I5042 Chronic combined systolic (congestive) and diastolic (congestive) heart failure: Secondary | ICD-10-CM

## 2024-03-30 DIAGNOSIS — E785 Hyperlipidemia, unspecified: Secondary | ICD-10-CM | POA: Diagnosis not present

## 2024-03-30 DIAGNOSIS — J449 Chronic obstructive pulmonary disease, unspecified: Secondary | ICD-10-CM | POA: Diagnosis not present

## 2024-03-30 DIAGNOSIS — Z87891 Personal history of nicotine dependence: Secondary | ICD-10-CM | POA: Diagnosis not present

## 2024-03-30 DIAGNOSIS — N183 Chronic kidney disease, stage 3 unspecified: Secondary | ICD-10-CM | POA: Diagnosis not present

## 2024-03-30 DIAGNOSIS — I5020 Unspecified systolic (congestive) heart failure: Secondary | ICD-10-CM | POA: Diagnosis not present

## 2024-03-30 DIAGNOSIS — I10 Essential (primary) hypertension: Secondary | ICD-10-CM

## 2024-03-30 DIAGNOSIS — I255 Ischemic cardiomyopathy: Secondary | ICD-10-CM

## 2024-03-30 DIAGNOSIS — I779 Disorder of arteries and arterioles, unspecified: Secondary | ICD-10-CM

## 2024-03-30 DIAGNOSIS — Z7984 Long term (current) use of oral hypoglycemic drugs: Secondary | ICD-10-CM | POA: Diagnosis not present

## 2024-03-30 DIAGNOSIS — I2510 Atherosclerotic heart disease of native coronary artery without angina pectoris: Secondary | ICD-10-CM | POA: Diagnosis not present

## 2024-03-30 DIAGNOSIS — I25118 Atherosclerotic heart disease of native coronary artery with other forms of angina pectoris: Secondary | ICD-10-CM

## 2024-03-30 DIAGNOSIS — E1169 Type 2 diabetes mellitus with other specified complication: Secondary | ICD-10-CM

## 2024-03-30 DIAGNOSIS — I130 Hypertensive heart and chronic kidney disease with heart failure and stage 1 through stage 4 chronic kidney disease, or unspecified chronic kidney disease: Secondary | ICD-10-CM | POA: Diagnosis not present

## 2024-03-30 DIAGNOSIS — E1122 Type 2 diabetes mellitus with diabetic chronic kidney disease: Secondary | ICD-10-CM | POA: Diagnosis not present

## 2024-03-30 DIAGNOSIS — R0600 Dyspnea, unspecified: Secondary | ICD-10-CM

## 2024-03-30 DIAGNOSIS — Z7982 Long term (current) use of aspirin: Secondary | ICD-10-CM | POA: Diagnosis not present

## 2024-03-30 DIAGNOSIS — Z96652 Presence of left artificial knee joint: Secondary | ICD-10-CM | POA: Diagnosis not present

## 2024-03-30 DIAGNOSIS — Z7902 Long term (current) use of antithrombotics/antiplatelets: Secondary | ICD-10-CM | POA: Diagnosis not present

## 2024-03-30 DIAGNOSIS — H269 Unspecified cataract: Secondary | ICD-10-CM | POA: Diagnosis not present

## 2024-04-05 DIAGNOSIS — J449 Chronic obstructive pulmonary disease, unspecified: Secondary | ICD-10-CM | POA: Diagnosis not present

## 2024-04-06 ENCOUNTER — Emergency Department
Admission: EM | Admit: 2024-04-06 | Discharge: 2024-04-07 | Disposition: A | Discharge status: Home / Self Care | Attending: Emergency Medicine | Admitting: Emergency Medicine

## 2024-04-06 ENCOUNTER — Other Ambulatory Visit: Payer: Self-pay

## 2024-04-06 ENCOUNTER — Emergency Department

## 2024-04-06 ENCOUNTER — Encounter: Payer: Self-pay | Admitting: Emergency Medicine

## 2024-04-06 DIAGNOSIS — R531 Weakness: Secondary | ICD-10-CM | POA: Diagnosis not present

## 2024-04-06 DIAGNOSIS — R519 Headache, unspecified: Secondary | ICD-10-CM | POA: Diagnosis not present

## 2024-04-06 DIAGNOSIS — Z743 Need for continuous supervision: Secondary | ICD-10-CM | POA: Diagnosis not present

## 2024-04-06 DIAGNOSIS — R739 Hyperglycemia, unspecified: Secondary | ICD-10-CM | POA: Insufficient documentation

## 2024-04-06 DIAGNOSIS — E1165 Type 2 diabetes mellitus with hyperglycemia: Secondary | ICD-10-CM | POA: Diagnosis not present

## 2024-04-06 LAB — CBC WITH DIFFERENTIAL/PLATELET
Abs Immature Granulocytes: 0.08 K/uL — ABNORMAL HIGH (ref 0.00–0.07)
Basophils Absolute: 0 K/uL (ref 0.0–0.1)
Basophils Relative: 1 %
Eosinophils Absolute: 0.4 K/uL (ref 0.0–0.5)
Eosinophils Relative: 5 %
HCT: 46.3 % (ref 39.0–52.0)
Hemoglobin: 15.2 g/dL (ref 13.0–17.0)
Immature Granulocytes: 1 %
Lymphocytes Relative: 30 %
Lymphs Abs: 2.4 K/uL (ref 0.7–4.0)
MCH: 31.7 pg (ref 26.0–34.0)
MCHC: 32.8 g/dL (ref 30.0–36.0)
MCV: 96.7 fL (ref 80.0–100.0)
Monocytes Absolute: 0.9 K/uL (ref 0.1–1.0)
Monocytes Relative: 11 %
Neutro Abs: 4.3 K/uL (ref 1.7–7.7)
Neutrophils Relative %: 52 %
Platelets: 220 K/uL (ref 150–400)
RBC: 4.79 MIL/uL (ref 4.22–5.81)
RDW: 13.1 % (ref 11.5–15.5)
WBC: 8.1 K/uL (ref 4.0–10.5)
nRBC: 0 % (ref 0.0–0.2)

## 2024-04-06 LAB — BASIC METABOLIC PANEL WITH GFR
Anion gap: 10 (ref 5–15)
BUN: 28 mg/dL — ABNORMAL HIGH (ref 8–23)
CO2: 24 mmol/L (ref 22–32)
Calcium: 8.8 mg/dL — ABNORMAL LOW (ref 8.9–10.3)
Chloride: 102 mmol/L (ref 98–111)
Creatinine, Ser: 1.2 mg/dL (ref 0.61–1.24)
GFR, Estimated: >60 mL/min (ref >60)
Glucose, Bld: 426 mg/dL — ABNORMAL HIGH (ref 70–99)
Potassium: 4.2 mmol/L (ref 3.5–5.1)
Sodium: 136 mmol/L (ref 135–145)

## 2024-04-06 LAB — SEDIMENTATION RATE: Sed Rate: 5 mm/h (ref 0–20)

## 2024-04-06 MED ORDER — INSULIN ASPART 100 UNIT/ML IJ SOLN
15.0000 [IU] | Freq: Once | INTRAMUSCULAR | Status: AC
  Administered 2024-04-06: 15 [IU] via SUBCUTANEOUS
  Filled 2024-04-06: qty 15

## 2024-04-06 MED ORDER — LACTATED RINGERS IV BOLUS
1000.0000 mL | Freq: Once | INTRAVENOUS | Status: AC
  Administered 2024-04-06: 1000 mL via INTRAVENOUS

## 2024-04-06 MED ORDER — ACETAMINOPHEN 500 MG PO TABS
1000.0000 mg | ORAL_TABLET | Freq: Once | ORAL | Status: AC
  Administered 2024-04-06: 1000 mg via ORAL
  Filled 2024-04-06: qty 2

## 2024-04-06 NOTE — ED Provider Notes (Signed)
 [facility] Provider Note    Event Date/Time   First MD Initiated Contact with Patient 04/06/24 2100     (approximate)   History   Facial Pain   HPI  Danny GOZA Sr. is a 73 y.o. male who presents to the ED for evaluation of Facial Pain   Patient presents with intermittent right temporal pain over the past day or 2.  Reports dry mouth and elevated sugars at his facility.  No temporal pain right now, when the pain comes on he denies any associated symptoms such as vision changes, weakness to the extremities  I review a 7/25 medical DC summary.  Vasculopath s/p left BKA who underwent thrombectomy/angioplasty of right sided popliteal, peroneal arteries.  Plavix  without AC   Physical Exam   Triage Vital Signs: ED Triage Vitals  Encounter Vitals Group     BP      Girls Systolic BP Percentile      Girls Diastolic BP Percentile      Boys Systolic BP Percentile      Boys Diastolic BP Percentile      Pulse      Resp      Temp      Temp src      SpO2      Weight      Height      Head Circumference      Peak Flow      Pain Score      Pain Loc      Pain Education      Exclude from Growth Chart     Most recent vital signs: Vitals:   04/06/24 2106 04/06/24 2111  BP: (!) 149/91   Pulse: 64   Resp: 18   Temp: (!) 97.5 F (36.4 C)   SpO2: 100% 99%    General: Awake, no distress.  CV:  Good peripheral perfusion.  Resp:  Normal effort.  Abd:  No distention.  MSK:  No deformity noted.  Neuro:  No focal deficits appreciated.  Left BKA with a clean stump Other:  No evidence of facial swelling or asymmetry, no signs of a proptosis or EOM entrapment.  Pupils are PERRL. Minimal tenderness over his right temple on palpation   ED Results / Procedures / Treatments   Labs (all labs ordered are listed, but only abnormal results are displayed) Labs Reviewed  BASIC METABOLIC PANEL WITH GFR - Abnormal; Notable for the following components:       Result Value   Glucose, Bld 426 (*)    BUN 28 (*)    Calcium  8.8 (*)    All other components within normal limits  CBC WITH DIFFERENTIAL/PLATELET - Abnormal; Notable for the following components:   Abs Immature Granulocytes 0.08 (*)    All other components within normal limits  SEDIMENTATION RATE    EKG   RADIOLOGY CT head interpreted by me without evidence of acute intracranial pathology  Official radiology report(s): CT HEAD WO CONTRAST ( ) Result Date: 04/06/2024 EXAM: CT HEAD WITHOUT CONTRAST 04/06/2024 09:42:52 PM TECHNIQUE: CT of the head was performed without the administration of intravenous contrast. Automated exposure control, iterative reconstruction, and/or weight based adjustment of the mA/kV was utilized to reduce the radiation dose to as low as reasonably achievable. COMPARISON: 02/19/2022 CLINICAL HISTORY: Right temporal pain. Pain has been intermittent today, described as an achy type pain, tender to touch. Denies injury. FINDINGS: BRAIN AND VENTRICLES: No acute hemorrhage. No evidence of acute  infarct. 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 No hydrocephalus. Mega cisterna magna. No extra-axial collection. No mass effect or midline shift. ORBITS: No acute abnormality. SINUSES: No acute abnormality. SOFT TISSUES AND SKULL: No acute soft tissue abnormality. No skull fracture. IMPRESSION: 1. No acute intracranial abnormality. 2. 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 Electronically signed by: Franky Stanford MD 04/06/2024 09:52 PM EDT RP Workstation: [id number]    PROCEDURES and INTERVENTIONS:  Procedures  Medications  acetaminophen  (TYLENOL ) tablet 1,000 mg (1,000 mg Oral Given 04/06/24 2116)  lactated ringers  bolus 1,000 mL (1,000 mLs Intravenous New Bag/Given 04/06/24 2140)  insulin  aspart (novoLOG ) injection 15 Units (15 Units Subcutaneous Given 04/06/24 2240)     IMPRESSION / MDM / ASSESSMENT AND PLAN / ED COURSE  I reviewed the triage vital signs  and the nursing notes.  Differential diagnosis includes, but is not limited to, temporal arteritis, ICH, tension or migraine headache, DKA  {Patient presents with symptoms of an acute illness or injury that is potentially life-threatening.  Patient presents to the ED with intermittent right sided headaches over the past 1-2 days.  He is hyperglycemic without acidosis.  No signs of neurologic deficits or trauma.  Some minimal tenderness over his right temporal area, but is a negative sed rate and I doubt temporal arteritis.  Normal CBC.  Provided IV fluids, subcutaneous insulin  and Tylenol .  Observed for about 2 hours but no recurrence of headache, benign workup and suitable for outpatient management.  Discussed return precautions.  Clinical Course as of 04/06/24 2256  Mon Apr 06, 2024  2237 Reassessed.  No recurrence of pain.  Reports feeling well.  We discussed hyperglycemia without other concerning features, reassuring imaging.  He is asking to go home. [DS]    Clinical Course User Index [DS] Claudene Rover, MD     FINAL CLINICAL IMPRESSION(S) / ED DIAGNOSES   Final diagnoses:  Hyperglycemia  Temporal pain     Rx / DC Orders   ED Discharge Orders     None        Note:  This document was prepared using Dragon voice recognition software and may include unintentional dictation errors.   Claudene Rover, MD 04/06/24 2256

## 2024-04-06 NOTE — ED Triage Notes (Addendum)
 BIB ems from [facility] for right side facial pain  Pain has been intermittent today, describes as an achy type pain, tender to touch  Denies injury  Cbgs have been running in the 400s per facility  A&ox4

## 2024-04-06 NOTE — Discharge Instructions (Signed)
Use Tylenol for pain and fevers.  Up to 1000 mg per dose, up to 4 times per day.  Do not take more than 4000 mg of Tylenol/acetaminophen within 24 hours..  

## 2024-04-07 DIAGNOSIS — Z7902 Long term (current) use of antithrombotics/antiplatelets: Secondary | ICD-10-CM | POA: Diagnosis not present

## 2024-04-07 DIAGNOSIS — E785 Hyperlipidemia, unspecified: Secondary | ICD-10-CM | POA: Diagnosis not present

## 2024-04-07 DIAGNOSIS — I5020 Unspecified systolic (congestive) heart failure: Secondary | ICD-10-CM | POA: Diagnosis not present

## 2024-04-07 DIAGNOSIS — Z7982 Long term (current) use of aspirin: Secondary | ICD-10-CM | POA: Diagnosis not present

## 2024-04-07 DIAGNOSIS — I130 Hypertensive heart and chronic kidney disease with heart failure and stage 1 through stage 4 chronic kidney disease, or unspecified chronic kidney disease: Secondary | ICD-10-CM | POA: Diagnosis not present

## 2024-04-07 DIAGNOSIS — Z7985 Long-term (current) use of injectable non-insulin antidiabetic drugs: Secondary | ICD-10-CM | POA: Diagnosis not present

## 2024-04-07 DIAGNOSIS — J449 Chronic obstructive pulmonary disease, unspecified: Secondary | ICD-10-CM | POA: Diagnosis not present

## 2024-04-07 DIAGNOSIS — Z87891 Personal history of nicotine dependence: Secondary | ICD-10-CM | POA: Diagnosis not present

## 2024-04-07 DIAGNOSIS — E1151 Type 2 diabetes mellitus with diabetic peripheral angiopathy without gangrene: Secondary | ICD-10-CM | POA: Diagnosis not present

## 2024-04-07 DIAGNOSIS — N183 Chronic kidney disease, stage 3 unspecified: Secondary | ICD-10-CM | POA: Diagnosis not present

## 2024-04-07 DIAGNOSIS — I2510 Atherosclerotic heart disease of native coronary artery without angina pectoris: Secondary | ICD-10-CM | POA: Diagnosis not present

## 2024-04-07 DIAGNOSIS — Z743 Need for continuous supervision: Secondary | ICD-10-CM | POA: Diagnosis not present

## 2024-04-07 DIAGNOSIS — Z7984 Long term (current) use of oral hypoglycemic drugs: Secondary | ICD-10-CM | POA: Diagnosis not present

## 2024-04-07 DIAGNOSIS — Z96652 Presence of left artificial knee joint: Secondary | ICD-10-CM | POA: Diagnosis not present

## 2024-04-07 DIAGNOSIS — H269 Unspecified cataract: Secondary | ICD-10-CM | POA: Diagnosis not present

## 2024-04-07 DIAGNOSIS — R739 Hyperglycemia, unspecified: Secondary | ICD-10-CM | POA: Diagnosis not present

## 2024-04-07 DIAGNOSIS — E1122 Type 2 diabetes mellitus with diabetic chronic kidney disease: Secondary | ICD-10-CM | POA: Diagnosis not present

## 2024-04-13 DIAGNOSIS — N183 Chronic kidney disease, stage 3 unspecified: Secondary | ICD-10-CM | POA: Diagnosis not present

## 2024-04-13 DIAGNOSIS — R079 Chest pain, unspecified: Secondary | ICD-10-CM | POA: Diagnosis not present

## 2024-04-13 DIAGNOSIS — E1136 Type 2 diabetes mellitus with diabetic cataract: Secondary | ICD-10-CM | POA: Diagnosis not present

## 2024-04-13 DIAGNOSIS — I739 Peripheral vascular disease, unspecified: Secondary | ICD-10-CM | POA: Diagnosis not present

## 2024-04-13 DIAGNOSIS — E1165 Type 2 diabetes mellitus with hyperglycemia: Secondary | ICD-10-CM | POA: Diagnosis not present

## 2024-04-13 DIAGNOSIS — E1122 Type 2 diabetes mellitus with diabetic chronic kidney disease: Secondary | ICD-10-CM | POA: Diagnosis not present

## 2024-04-13 DIAGNOSIS — E538 Deficiency of other specified B group vitamins: Secondary | ICD-10-CM | POA: Diagnosis not present

## 2024-04-13 DIAGNOSIS — S91309A Unspecified open wound, unspecified foot, initial encounter: Secondary | ICD-10-CM | POA: Diagnosis not present

## 2024-04-13 DIAGNOSIS — I509 Heart failure, unspecified: Secondary | ICD-10-CM | POA: Diagnosis not present

## 2024-04-15 DIAGNOSIS — Z96652 Presence of left artificial knee joint: Secondary | ICD-10-CM | POA: Diagnosis not present

## 2024-04-15 DIAGNOSIS — Z7982 Long term (current) use of aspirin: Secondary | ICD-10-CM | POA: Diagnosis not present

## 2024-04-15 DIAGNOSIS — I5020 Unspecified systolic (congestive) heart failure: Secondary | ICD-10-CM | POA: Diagnosis not present

## 2024-04-15 DIAGNOSIS — N183 Chronic kidney disease, stage 3 unspecified: Secondary | ICD-10-CM | POA: Diagnosis not present

## 2024-04-15 DIAGNOSIS — I2510 Atherosclerotic heart disease of native coronary artery without angina pectoris: Secondary | ICD-10-CM | POA: Diagnosis not present

## 2024-04-15 DIAGNOSIS — Z7902 Long term (current) use of antithrombotics/antiplatelets: Secondary | ICD-10-CM | POA: Diagnosis not present

## 2024-04-15 DIAGNOSIS — I130 Hypertensive heart and chronic kidney disease with heart failure and stage 1 through stage 4 chronic kidney disease, or unspecified chronic kidney disease: Secondary | ICD-10-CM | POA: Diagnosis not present

## 2024-04-15 DIAGNOSIS — Z87891 Personal history of nicotine dependence: Secondary | ICD-10-CM | POA: Diagnosis not present

## 2024-04-15 DIAGNOSIS — E785 Hyperlipidemia, unspecified: Secondary | ICD-10-CM | POA: Diagnosis not present

## 2024-04-15 DIAGNOSIS — J449 Chronic obstructive pulmonary disease, unspecified: Secondary | ICD-10-CM | POA: Diagnosis not present

## 2024-04-15 DIAGNOSIS — Z7985 Long-term (current) use of injectable non-insulin antidiabetic drugs: Secondary | ICD-10-CM | POA: Diagnosis not present

## 2024-04-15 DIAGNOSIS — Z7984 Long term (current) use of oral hypoglycemic drugs: Secondary | ICD-10-CM | POA: Diagnosis not present

## 2024-04-15 DIAGNOSIS — E1122 Type 2 diabetes mellitus with diabetic chronic kidney disease: Secondary | ICD-10-CM | POA: Diagnosis not present

## 2024-04-15 DIAGNOSIS — H269 Unspecified cataract: Secondary | ICD-10-CM | POA: Diagnosis not present

## 2024-04-21 DIAGNOSIS — E1122 Type 2 diabetes mellitus with diabetic chronic kidney disease: Secondary | ICD-10-CM | POA: Diagnosis not present

## 2024-04-21 DIAGNOSIS — Z7902 Long term (current) use of antithrombotics/antiplatelets: Secondary | ICD-10-CM | POA: Diagnosis not present

## 2024-04-21 DIAGNOSIS — Z96652 Presence of left artificial knee joint: Secondary | ICD-10-CM | POA: Diagnosis not present

## 2024-04-21 DIAGNOSIS — I130 Hypertensive heart and chronic kidney disease with heart failure and stage 1 through stage 4 chronic kidney disease, or unspecified chronic kidney disease: Secondary | ICD-10-CM | POA: Diagnosis not present

## 2024-04-21 DIAGNOSIS — J449 Chronic obstructive pulmonary disease, unspecified: Secondary | ICD-10-CM | POA: Diagnosis not present

## 2024-04-21 DIAGNOSIS — Z7984 Long term (current) use of oral hypoglycemic drugs: Secondary | ICD-10-CM | POA: Diagnosis not present

## 2024-04-21 DIAGNOSIS — Z87891 Personal history of nicotine dependence: Secondary | ICD-10-CM | POA: Diagnosis not present

## 2024-04-21 DIAGNOSIS — I5020 Unspecified systolic (congestive) heart failure: Secondary | ICD-10-CM | POA: Diagnosis not present

## 2024-04-21 DIAGNOSIS — H269 Unspecified cataract: Secondary | ICD-10-CM | POA: Diagnosis not present

## 2024-04-21 DIAGNOSIS — Z7985 Long-term (current) use of injectable non-insulin antidiabetic drugs: Secondary | ICD-10-CM | POA: Diagnosis not present

## 2024-04-21 DIAGNOSIS — E1151 Type 2 diabetes mellitus with diabetic peripheral angiopathy without gangrene: Secondary | ICD-10-CM | POA: Diagnosis not present

## 2024-04-21 DIAGNOSIS — Z7982 Long term (current) use of aspirin: Secondary | ICD-10-CM | POA: Diagnosis not present

## 2024-04-21 DIAGNOSIS — I2510 Atherosclerotic heart disease of native coronary artery without angina pectoris: Secondary | ICD-10-CM | POA: Diagnosis not present

## 2024-04-21 DIAGNOSIS — E785 Hyperlipidemia, unspecified: Secondary | ICD-10-CM | POA: Diagnosis not present

## 2024-04-21 DIAGNOSIS — N183 Chronic kidney disease, stage 3 unspecified: Secondary | ICD-10-CM | POA: Diagnosis not present

## 2024-04-22 DIAGNOSIS — E1122 Type 2 diabetes mellitus with diabetic chronic kidney disease: Secondary | ICD-10-CM | POA: Diagnosis not present

## 2024-04-27 DIAGNOSIS — E785 Hyperlipidemia, unspecified: Secondary | ICD-10-CM | POA: Diagnosis not present

## 2024-04-27 DIAGNOSIS — E1151 Type 2 diabetes mellitus with diabetic peripheral angiopathy without gangrene: Secondary | ICD-10-CM | POA: Diagnosis not present

## 2024-04-27 DIAGNOSIS — I2510 Atherosclerotic heart disease of native coronary artery without angina pectoris: Secondary | ICD-10-CM | POA: Diagnosis not present

## 2024-04-27 DIAGNOSIS — Z87891 Personal history of nicotine dependence: Secondary | ICD-10-CM | POA: Diagnosis not present

## 2024-04-27 DIAGNOSIS — J449 Chronic obstructive pulmonary disease, unspecified: Secondary | ICD-10-CM | POA: Diagnosis not present

## 2024-04-27 DIAGNOSIS — E1122 Type 2 diabetes mellitus with diabetic chronic kidney disease: Secondary | ICD-10-CM | POA: Diagnosis not present

## 2024-04-27 DIAGNOSIS — R079 Chest pain, unspecified: Secondary | ICD-10-CM | POA: Diagnosis not present

## 2024-04-27 DIAGNOSIS — H269 Unspecified cataract: Secondary | ICD-10-CM | POA: Diagnosis not present

## 2024-04-27 DIAGNOSIS — B351 Tinea unguium: Secondary | ICD-10-CM | POA: Diagnosis not present

## 2024-04-27 DIAGNOSIS — N183 Chronic kidney disease, stage 3 unspecified: Secondary | ICD-10-CM | POA: Diagnosis not present

## 2024-04-27 DIAGNOSIS — Z96652 Presence of left artificial knee joint: Secondary | ICD-10-CM | POA: Diagnosis not present

## 2024-04-27 DIAGNOSIS — Z7985 Long-term (current) use of injectable non-insulin antidiabetic drugs: Secondary | ICD-10-CM | POA: Diagnosis not present

## 2024-04-27 DIAGNOSIS — R2681 Unsteadiness on feet: Secondary | ICD-10-CM | POA: Diagnosis not present

## 2024-04-27 DIAGNOSIS — Z7902 Long term (current) use of antithrombotics/antiplatelets: Secondary | ICD-10-CM | POA: Diagnosis not present

## 2024-04-27 DIAGNOSIS — E1165 Type 2 diabetes mellitus with hyperglycemia: Secondary | ICD-10-CM | POA: Diagnosis not present

## 2024-04-27 DIAGNOSIS — Z7982 Long term (current) use of aspirin: Secondary | ICD-10-CM | POA: Diagnosis not present

## 2024-04-27 DIAGNOSIS — I130 Hypertensive heart and chronic kidney disease with heart failure and stage 1 through stage 4 chronic kidney disease, or unspecified chronic kidney disease: Secondary | ICD-10-CM | POA: Diagnosis not present

## 2024-04-27 DIAGNOSIS — Z7984 Long term (current) use of oral hypoglycemic drugs: Secondary | ICD-10-CM | POA: Diagnosis not present

## 2024-04-27 DIAGNOSIS — I5020 Unspecified systolic (congestive) heart failure: Secondary | ICD-10-CM | POA: Diagnosis not present

## 2024-04-28 DIAGNOSIS — Z87891 Personal history of nicotine dependence: Secondary | ICD-10-CM | POA: Diagnosis not present

## 2024-04-28 DIAGNOSIS — J449 Chronic obstructive pulmonary disease, unspecified: Secondary | ICD-10-CM | POA: Diagnosis not present

## 2024-04-28 DIAGNOSIS — Z7984 Long term (current) use of oral hypoglycemic drugs: Secondary | ICD-10-CM | POA: Diagnosis not present

## 2024-04-28 DIAGNOSIS — I509 Heart failure, unspecified: Secondary | ICD-10-CM | POA: Diagnosis not present

## 2024-04-28 DIAGNOSIS — I130 Hypertensive heart and chronic kidney disease with heart failure and stage 1 through stage 4 chronic kidney disease, or unspecified chronic kidney disease: Secondary | ICD-10-CM | POA: Diagnosis not present

## 2024-04-28 DIAGNOSIS — R2681 Unsteadiness on feet: Secondary | ICD-10-CM | POA: Diagnosis not present

## 2024-04-28 DIAGNOSIS — E1122 Type 2 diabetes mellitus with diabetic chronic kidney disease: Secondary | ICD-10-CM | POA: Diagnosis not present

## 2024-04-28 DIAGNOSIS — E785 Hyperlipidemia, unspecified: Secondary | ICD-10-CM | POA: Diagnosis not present

## 2024-04-28 DIAGNOSIS — Z7985 Long-term (current) use of injectable non-insulin antidiabetic drugs: Secondary | ICD-10-CM | POA: Diagnosis not present

## 2024-04-28 DIAGNOSIS — Z7982 Long term (current) use of aspirin: Secondary | ICD-10-CM | POA: Diagnosis not present

## 2024-04-28 DIAGNOSIS — I2510 Atherosclerotic heart disease of native coronary artery without angina pectoris: Secondary | ICD-10-CM | POA: Diagnosis not present

## 2024-04-28 DIAGNOSIS — Z7902 Long term (current) use of antithrombotics/antiplatelets: Secondary | ICD-10-CM | POA: Diagnosis not present

## 2024-04-28 DIAGNOSIS — H269 Unspecified cataract: Secondary | ICD-10-CM | POA: Diagnosis not present

## 2024-04-28 DIAGNOSIS — E1165 Type 2 diabetes mellitus with hyperglycemia: Secondary | ICD-10-CM | POA: Diagnosis not present

## 2024-04-28 DIAGNOSIS — Z96652 Presence of left artificial knee joint: Secondary | ICD-10-CM | POA: Diagnosis not present

## 2024-04-28 DIAGNOSIS — I5020 Unspecified systolic (congestive) heart failure: Secondary | ICD-10-CM | POA: Diagnosis not present

## 2024-04-28 DIAGNOSIS — R079 Chest pain, unspecified: Secondary | ICD-10-CM | POA: Diagnosis not present

## 2024-04-28 DIAGNOSIS — E1151 Type 2 diabetes mellitus with diabetic peripheral angiopathy without gangrene: Secondary | ICD-10-CM | POA: Diagnosis not present

## 2024-04-28 DIAGNOSIS — B351 Tinea unguium: Secondary | ICD-10-CM | POA: Diagnosis not present

## 2024-04-28 DIAGNOSIS — N183 Chronic kidney disease, stage 3 unspecified: Secondary | ICD-10-CM | POA: Diagnosis not present

## 2024-05-02 DIAGNOSIS — N183 Chronic kidney disease, stage 3 unspecified: Secondary | ICD-10-CM | POA: Diagnosis not present

## 2024-05-02 DIAGNOSIS — J449 Chronic obstructive pulmonary disease, unspecified: Secondary | ICD-10-CM | POA: Diagnosis not present

## 2024-05-04 DIAGNOSIS — B351 Tinea unguium: Secondary | ICD-10-CM | POA: Diagnosis not present

## 2024-05-04 DIAGNOSIS — E1140 Type 2 diabetes mellitus with diabetic neuropathy, unspecified: Secondary | ICD-10-CM | POA: Diagnosis not present

## 2024-05-05 DIAGNOSIS — Z7982 Long term (current) use of aspirin: Secondary | ICD-10-CM | POA: Diagnosis not present

## 2024-05-05 DIAGNOSIS — H269 Unspecified cataract: Secondary | ICD-10-CM | POA: Diagnosis not present

## 2024-05-05 DIAGNOSIS — J449 Chronic obstructive pulmonary disease, unspecified: Secondary | ICD-10-CM | POA: Diagnosis not present

## 2024-05-05 DIAGNOSIS — Z7984 Long term (current) use of oral hypoglycemic drugs: Secondary | ICD-10-CM | POA: Diagnosis not present

## 2024-05-05 DIAGNOSIS — I2510 Atherosclerotic heart disease of native coronary artery without angina pectoris: Secondary | ICD-10-CM | POA: Diagnosis not present

## 2024-05-05 DIAGNOSIS — I5020 Unspecified systolic (congestive) heart failure: Secondary | ICD-10-CM | POA: Diagnosis not present

## 2024-05-05 DIAGNOSIS — Z7902 Long term (current) use of antithrombotics/antiplatelets: Secondary | ICD-10-CM | POA: Diagnosis not present

## 2024-05-05 DIAGNOSIS — E785 Hyperlipidemia, unspecified: Secondary | ICD-10-CM | POA: Diagnosis not present

## 2024-05-05 DIAGNOSIS — Z87891 Personal history of nicotine dependence: Secondary | ICD-10-CM | POA: Diagnosis not present

## 2024-05-05 DIAGNOSIS — E1151 Type 2 diabetes mellitus with diabetic peripheral angiopathy without gangrene: Secondary | ICD-10-CM | POA: Diagnosis not present

## 2024-05-05 DIAGNOSIS — I130 Hypertensive heart and chronic kidney disease with heart failure and stage 1 through stage 4 chronic kidney disease, or unspecified chronic kidney disease: Secondary | ICD-10-CM | POA: Diagnosis not present

## 2024-05-05 DIAGNOSIS — Z96652 Presence of left artificial knee joint: Secondary | ICD-10-CM | POA: Diagnosis not present

## 2024-05-05 DIAGNOSIS — E1122 Type 2 diabetes mellitus with diabetic chronic kidney disease: Secondary | ICD-10-CM | POA: Diagnosis not present

## 2024-05-05 DIAGNOSIS — N183 Chronic kidney disease, stage 3 unspecified: Secondary | ICD-10-CM | POA: Diagnosis not present

## 2024-05-05 DIAGNOSIS — Z7985 Long-term (current) use of injectable non-insulin antidiabetic drugs: Secondary | ICD-10-CM | POA: Diagnosis not present

## 2024-05-08 DIAGNOSIS — N183 Chronic kidney disease, stage 3 unspecified: Secondary | ICD-10-CM | POA: Diagnosis not present

## 2024-05-08 DIAGNOSIS — Z7984 Long term (current) use of oral hypoglycemic drugs: Secondary | ICD-10-CM | POA: Diagnosis not present

## 2024-05-08 DIAGNOSIS — J449 Chronic obstructive pulmonary disease, unspecified: Secondary | ICD-10-CM | POA: Diagnosis not present

## 2024-05-08 DIAGNOSIS — I5020 Unspecified systolic (congestive) heart failure: Secondary | ICD-10-CM | POA: Diagnosis not present

## 2024-05-08 DIAGNOSIS — Z96652 Presence of left artificial knee joint: Secondary | ICD-10-CM | POA: Diagnosis not present

## 2024-05-08 DIAGNOSIS — E785 Hyperlipidemia, unspecified: Secondary | ICD-10-CM | POA: Diagnosis not present

## 2024-05-08 DIAGNOSIS — E1151 Type 2 diabetes mellitus with diabetic peripheral angiopathy without gangrene: Secondary | ICD-10-CM | POA: Diagnosis not present

## 2024-05-08 DIAGNOSIS — I130 Hypertensive heart and chronic kidney disease with heart failure and stage 1 through stage 4 chronic kidney disease, or unspecified chronic kidney disease: Secondary | ICD-10-CM | POA: Diagnosis not present

## 2024-05-08 DIAGNOSIS — Z87891 Personal history of nicotine dependence: Secondary | ICD-10-CM | POA: Diagnosis not present

## 2024-05-08 DIAGNOSIS — Z7985 Long-term (current) use of injectable non-insulin antidiabetic drugs: Secondary | ICD-10-CM | POA: Diagnosis not present

## 2024-05-08 DIAGNOSIS — I2510 Atherosclerotic heart disease of native coronary artery without angina pectoris: Secondary | ICD-10-CM | POA: Diagnosis not present

## 2024-05-08 DIAGNOSIS — H269 Unspecified cataract: Secondary | ICD-10-CM | POA: Diagnosis not present

## 2024-05-08 DIAGNOSIS — Z7982 Long term (current) use of aspirin: Secondary | ICD-10-CM | POA: Diagnosis not present

## 2024-05-08 DIAGNOSIS — E1122 Type 2 diabetes mellitus with diabetic chronic kidney disease: Secondary | ICD-10-CM | POA: Diagnosis not present

## 2024-05-08 DIAGNOSIS — Z7902 Long term (current) use of antithrombotics/antiplatelets: Secondary | ICD-10-CM | POA: Diagnosis not present

## 2024-05-11 DIAGNOSIS — R079 Chest pain, unspecified: Secondary | ICD-10-CM | POA: Diagnosis not present

## 2024-05-11 DIAGNOSIS — Z7902 Long term (current) use of antithrombotics/antiplatelets: Secondary | ICD-10-CM | POA: Diagnosis not present

## 2024-05-11 DIAGNOSIS — E1122 Type 2 diabetes mellitus with diabetic chronic kidney disease: Secondary | ICD-10-CM | POA: Diagnosis not present

## 2024-05-11 DIAGNOSIS — I130 Hypertensive heart and chronic kidney disease with heart failure and stage 1 through stage 4 chronic kidney disease, or unspecified chronic kidney disease: Secondary | ICD-10-CM | POA: Diagnosis not present

## 2024-05-11 DIAGNOSIS — N183 Chronic kidney disease, stage 3 unspecified: Secondary | ICD-10-CM | POA: Diagnosis not present

## 2024-05-11 DIAGNOSIS — Z7982 Long term (current) use of aspirin: Secondary | ICD-10-CM | POA: Diagnosis not present

## 2024-05-11 DIAGNOSIS — I5020 Unspecified systolic (congestive) heart failure: Secondary | ICD-10-CM | POA: Diagnosis not present

## 2024-05-11 DIAGNOSIS — Z7984 Long term (current) use of oral hypoglycemic drugs: Secondary | ICD-10-CM | POA: Diagnosis not present

## 2024-05-11 DIAGNOSIS — M79674 Pain in right toe(s): Secondary | ICD-10-CM | POA: Diagnosis not present

## 2024-05-11 DIAGNOSIS — H269 Unspecified cataract: Secondary | ICD-10-CM | POA: Diagnosis not present

## 2024-05-11 DIAGNOSIS — Z96652 Presence of left artificial knee joint: Secondary | ICD-10-CM | POA: Diagnosis not present

## 2024-05-11 DIAGNOSIS — I2510 Atherosclerotic heart disease of native coronary artery without angina pectoris: Secondary | ICD-10-CM | POA: Diagnosis not present

## 2024-05-11 DIAGNOSIS — E785 Hyperlipidemia, unspecified: Secondary | ICD-10-CM | POA: Diagnosis not present

## 2024-05-11 DIAGNOSIS — R2681 Unsteadiness on feet: Secondary | ICD-10-CM | POA: Diagnosis not present

## 2024-05-11 DIAGNOSIS — E1151 Type 2 diabetes mellitus with diabetic peripheral angiopathy without gangrene: Secondary | ICD-10-CM | POA: Diagnosis not present

## 2024-05-11 DIAGNOSIS — E1165 Type 2 diabetes mellitus with hyperglycemia: Secondary | ICD-10-CM | POA: Diagnosis not present

## 2024-05-11 DIAGNOSIS — Z87891 Personal history of nicotine dependence: Secondary | ICD-10-CM | POA: Diagnosis not present

## 2024-05-11 DIAGNOSIS — Z7985 Long-term (current) use of injectable non-insulin antidiabetic drugs: Secondary | ICD-10-CM | POA: Diagnosis not present

## 2024-05-11 DIAGNOSIS — J449 Chronic obstructive pulmonary disease, unspecified: Secondary | ICD-10-CM | POA: Diagnosis not present

## 2024-05-13 DIAGNOSIS — Z7984 Long term (current) use of oral hypoglycemic drugs: Secondary | ICD-10-CM | POA: Diagnosis not present

## 2024-05-13 DIAGNOSIS — E1122 Type 2 diabetes mellitus with diabetic chronic kidney disease: Secondary | ICD-10-CM | POA: Diagnosis not present

## 2024-05-13 DIAGNOSIS — I130 Hypertensive heart and chronic kidney disease with heart failure and stage 1 through stage 4 chronic kidney disease, or unspecified chronic kidney disease: Secondary | ICD-10-CM | POA: Diagnosis not present

## 2024-05-13 DIAGNOSIS — I5020 Unspecified systolic (congestive) heart failure: Secondary | ICD-10-CM | POA: Diagnosis not present

## 2024-05-13 DIAGNOSIS — E785 Hyperlipidemia, unspecified: Secondary | ICD-10-CM | POA: Diagnosis not present

## 2024-05-13 DIAGNOSIS — Z7982 Long term (current) use of aspirin: Secondary | ICD-10-CM | POA: Diagnosis not present

## 2024-05-13 DIAGNOSIS — Z87891 Personal history of nicotine dependence: Secondary | ICD-10-CM | POA: Diagnosis not present

## 2024-05-13 DIAGNOSIS — Z96652 Presence of left artificial knee joint: Secondary | ICD-10-CM | POA: Diagnosis not present

## 2024-05-13 DIAGNOSIS — E1151 Type 2 diabetes mellitus with diabetic peripheral angiopathy without gangrene: Secondary | ICD-10-CM | POA: Diagnosis not present

## 2024-05-13 DIAGNOSIS — H269 Unspecified cataract: Secondary | ICD-10-CM | POA: Diagnosis not present

## 2024-05-13 DIAGNOSIS — I2510 Atherosclerotic heart disease of native coronary artery without angina pectoris: Secondary | ICD-10-CM | POA: Diagnosis not present

## 2024-05-13 DIAGNOSIS — N183 Chronic kidney disease, stage 3 unspecified: Secondary | ICD-10-CM | POA: Diagnosis not present

## 2024-05-13 DIAGNOSIS — J449 Chronic obstructive pulmonary disease, unspecified: Secondary | ICD-10-CM | POA: Diagnosis not present

## 2024-05-13 DIAGNOSIS — Z7902 Long term (current) use of antithrombotics/antiplatelets: Secondary | ICD-10-CM | POA: Diagnosis not present

## 2024-05-13 DIAGNOSIS — Z7985 Long-term (current) use of injectable non-insulin antidiabetic drugs: Secondary | ICD-10-CM | POA: Diagnosis not present

## 2024-05-19 DIAGNOSIS — Z7982 Long term (current) use of aspirin: Secondary | ICD-10-CM | POA: Diagnosis not present

## 2024-05-19 DIAGNOSIS — J449 Chronic obstructive pulmonary disease, unspecified: Secondary | ICD-10-CM | POA: Diagnosis not present

## 2024-05-19 DIAGNOSIS — E785 Hyperlipidemia, unspecified: Secondary | ICD-10-CM | POA: Diagnosis not present

## 2024-05-19 DIAGNOSIS — E1151 Type 2 diabetes mellitus with diabetic peripheral angiopathy without gangrene: Secondary | ICD-10-CM | POA: Diagnosis not present

## 2024-05-19 DIAGNOSIS — E1122 Type 2 diabetes mellitus with diabetic chronic kidney disease: Secondary | ICD-10-CM | POA: Diagnosis not present

## 2024-05-19 DIAGNOSIS — Z7985 Long-term (current) use of injectable non-insulin antidiabetic drugs: Secondary | ICD-10-CM | POA: Diagnosis not present

## 2024-05-19 DIAGNOSIS — Z87891 Personal history of nicotine dependence: Secondary | ICD-10-CM | POA: Diagnosis not present

## 2024-05-19 DIAGNOSIS — N183 Chronic kidney disease, stage 3 unspecified: Secondary | ICD-10-CM | POA: Diagnosis not present

## 2024-05-19 DIAGNOSIS — Z7902 Long term (current) use of antithrombotics/antiplatelets: Secondary | ICD-10-CM | POA: Diagnosis not present

## 2024-05-19 DIAGNOSIS — I130 Hypertensive heart and chronic kidney disease with heart failure and stage 1 through stage 4 chronic kidney disease, or unspecified chronic kidney disease: Secondary | ICD-10-CM | POA: Diagnosis not present

## 2024-05-19 DIAGNOSIS — H269 Unspecified cataract: Secondary | ICD-10-CM | POA: Diagnosis not present

## 2024-05-19 DIAGNOSIS — Z7984 Long term (current) use of oral hypoglycemic drugs: Secondary | ICD-10-CM | POA: Diagnosis not present

## 2024-05-19 DIAGNOSIS — I2510 Atherosclerotic heart disease of native coronary artery without angina pectoris: Secondary | ICD-10-CM | POA: Diagnosis not present

## 2024-05-19 DIAGNOSIS — Z96652 Presence of left artificial knee joint: Secondary | ICD-10-CM | POA: Diagnosis not present

## 2024-05-19 DIAGNOSIS — I5020 Unspecified systolic (congestive) heart failure: Secondary | ICD-10-CM | POA: Diagnosis not present

## 2024-05-21 DIAGNOSIS — H2513 Age-related nuclear cataract, bilateral: Secondary | ICD-10-CM | POA: Diagnosis not present

## 2024-05-21 DIAGNOSIS — H538 Other visual disturbances: Secondary | ICD-10-CM | POA: Diagnosis not present

## 2024-05-21 DIAGNOSIS — H0288A Meibomian gland dysfunction right eye, upper and lower eyelids: Secondary | ICD-10-CM | POA: Diagnosis not present

## 2024-05-21 DIAGNOSIS — E119 Type 2 diabetes mellitus without complications: Secondary | ICD-10-CM | POA: Diagnosis not present

## 2024-06-07 NOTE — Progress Notes (Deleted)
 NO SHOW

## 2024-06-08 ENCOUNTER — Ambulatory Visit: Attending: Cardiovascular Disease | Admitting: Cardiovascular Disease

## 2024-06-08 DIAGNOSIS — I255 Ischemic cardiomyopathy: Secondary | ICD-10-CM

## 2024-06-08 DIAGNOSIS — E1169 Type 2 diabetes mellitus with other specified complication: Secondary | ICD-10-CM

## 2024-06-08 DIAGNOSIS — L97909 Non-pressure chronic ulcer of unspecified part of unspecified lower leg with unspecified severity: Secondary | ICD-10-CM

## 2024-06-08 DIAGNOSIS — N183 Chronic kidney disease, stage 3 unspecified: Secondary | ICD-10-CM

## 2024-06-08 DIAGNOSIS — J432 Centrilobular emphysema: Secondary | ICD-10-CM

## 2024-06-08 DIAGNOSIS — E785 Hyperlipidemia, unspecified: Secondary | ICD-10-CM

## 2024-06-08 DIAGNOSIS — I10 Essential (primary) hypertension: Secondary | ICD-10-CM

## 2024-06-08 DIAGNOSIS — I5042 Chronic combined systolic (congestive) and diastolic (congestive) heart failure: Secondary | ICD-10-CM

## 2024-06-08 DIAGNOSIS — R0600 Dyspnea, unspecified: Secondary | ICD-10-CM

## 2024-06-08 DIAGNOSIS — I779 Disorder of arteries and arterioles, unspecified: Secondary | ICD-10-CM

## 2024-06-08 DIAGNOSIS — I25118 Atherosclerotic heart disease of native coronary artery with other forms of angina pectoris: Secondary | ICD-10-CM

## 2024-06-15 ENCOUNTER — Encounter: Payer: Self-pay | Admitting: Ophthalmology

## 2024-06-15 NOTE — Discharge Instructions (Signed)

## 2024-06-16 ENCOUNTER — Encounter: Payer: Self-pay | Admitting: Ophthalmology

## 2024-06-16 NOTE — Anesthesia Preprocedure Evaluation (Addendum)
 Anesthesia Evaluation  Patient identified by MRN, date of birth, ID band Patient awake    Reviewed: Allergy & Precautions, H&P , NPO status , Patient's Chart, lab work & pertinent test results  Airway Mallampati: III  TM Distance: <3 FB Neck ROM: Full  Mouth opening: Limited Mouth Opening Comment: Thick neck Dental no notable dental hx. (+) Edentulous Upper, Edentulous Lower   Pulmonary COPD, former smoker   Pulmonary exam normal breath sounds clear to auscultation       Cardiovascular hypertension, + angina  + CAD, + Peripheral Vascular Disease and +CHF  Normal cardiovascular exam Rhythm:Regular Rate:Normal  12 There is moderate to severe left ventricular systolic dysfunction.  The left ventricular ejection fraction is 25-35% by visual estimate.  There is trivial (1+) mitral regurgitation.  There is no aortic valve stenosis.  Mid LAD lesion, 20 %stenosed.  Ost 2nd Diag to 2nd Diag lesion, 90 %stenosed.  Dist LAD lesion, 40 %stenosed.  Ost 1st Diag lesion, 90 %stenosed.  1st Diag lesion, 80 %stenosed.  1st Mrg lesion, 90 %stenosed.  1st RPLB lesion, 90 %stenosed.   1. No evidence of obstructive disease affecting the main arteries. Patent mid LAD stent with mild in-stent restenosis and moderate LAD disease distally. The patient does have diffuse small vessel branch disease affecting 2 diagonals, OM1 and PL 1. The disease is distal. 2. Moderately to severely reduced LV systolic function with an EF of 25-30% with global hypokinesis. 3. Moderately elevated filling pressures.   Recommendations: Compared to his most recent cardiac catheterization in 2016, the ejection fraction has decreased significantly. The patient seems to have mixed ischemic/ nonischemic cardiomyopathy. Continue medical therapy for coronary artery disease. Optimize heart failure treatment. I increased the dose of lisinopril . The patient is volume  overloaded and I started oral furosemide . I'm going to avoid aggressive diuresis due to contrast exposure. Consider screening for sleep apnea if that has not been done in the past.   -18-17 cath     12-06-20 echo Sonographer Comments: Technically difficult study due to poor echo windows  and suboptimal parasternal window.  IMPRESSIONS     1. Left ventricular ejection fraction, by estimation, is 35 to 40%. The  left ventricle has moderately decreased function. The left ventricle  demonstrates global hypokinesis. Left ventricular diastolic parameters are  indeterminate.   2. Right ventricular systolic function is normal. The right ventricular  size is normal.   3. The mitral valve was not well visualized. No evidence of mitral valve  regurgitation.   4. The aortic valve was not well visualized. Aortic valve regurgitation  is not visualized. No aortic stenosis is present.   5. The inferior vena cava is normal in size with greater than 50%  respiratory variability, suggesting right atrial pressure of 3 mmHg.      Neuro/Psych  PSYCHIATRIC DISORDERS Anxiety Depression     Neuromuscular disease negative neurological ROS  negative psych ROS   GI/Hepatic negative GI ROS, Neg liver ROS,GERD  ,,  Endo/Other  diabetes    Renal/GU Renal diseasenegative Renal ROS  negative genitourinary   Musculoskeletal negative musculoskeletal ROS (+)    Abdominal   Peds negative pediatric ROS (+)  Hematology negative hematology ROS (+)   Anesthesia Other Findings Medical History  DM2 (diabetes mellitus, type 2) (HCC)  HLD (hyperlipidemia) HTN (hypertension)  CAD (coronary artery disease) COPD (chronic obstructive pulmonary disease) (HCC)  Ischemic cardiomyopathy Chronic combined systolic and diastolic CHF (congestive heart failure) (HCC)  CKD (chronic kidney disease),  stage III [facility]) Erectile dysfunction  Cellulitis of left foot Peripheral vascular disease  Intracranial hemorrhage  (HCC) GERD (gastroesophageal reflux disease) Neuromuscular disorder (HCC) Amputation of lower extremity (HCC) Hx of left BKA (HCC) Diabetic polyneuropathy associated with type 2 diabetes mellitus (HCC)  CKD stage 3 due to type 2 diabetes mellitus (HCC) Anxiety and depression  Phantom pain after amputation of lower extremity (HCC) Atherosclerosis of native artery of right lower extremity with intermittent claudication     Reproductive/Obstetrics negative OB ROS                              Anesthesia Physical Anesthesia Plan  ASA: 4  Anesthesia Plan: MAC   Post-op Pain Management:    Induction: Intravenous  PONV Risk Score and Plan:   Airway Management Planned: Natural Airway and Nasal Cannula  Additional Equipment:   Intra-op Plan:   Post-operative Plan:   Informed Consent: I have reviewed the patients History and Physical, chart, labs and discussed the procedure including the risks, benefits and alternatives for the proposed anesthesia with the patient or authorized representative who has indicated his/her understanding and acceptance.     Dental Advisory Given  Plan Discussed with: Anesthesiologist, CRNA and Surgeon  Anesthesia Plan Comments: (Patient consented for risks of anesthesia including but not limited to:  - adverse reactions to medications - damage to eyes, teeth, lips or other oral mucosa - nerve damage due to positioning  - sore throat or hoarseness - Damage to heart, brain, nerves, lungs, other parts of body or loss of life  Patient voiced understanding and assent.)         Anesthesia Quick Evaluation

## 2024-06-17 ENCOUNTER — Ambulatory Visit
Admission: RE | Admit: 2024-06-17 | Discharge: 2024-06-17 | Disposition: A | Discharge status: Home / Self Care | Attending: Ophthalmology | Admitting: Ophthalmology

## 2024-06-17 ENCOUNTER — Other Ambulatory Visit: Payer: Self-pay

## 2024-06-17 ENCOUNTER — Encounter
Admission: RE | Disposition: A | Discharge status: Home / Self Care | Payer: Self-pay | Source: Home / Self Care | Attending: Ophthalmology

## 2024-06-17 ENCOUNTER — Ambulatory Visit: Admitting: Anesthesiology

## 2024-06-17 ENCOUNTER — Encounter: Payer: Self-pay | Admitting: Ophthalmology

## 2024-06-17 DIAGNOSIS — Z7985 Long-term (current) use of injectable non-insulin antidiabetic drugs: Secondary | ICD-10-CM | POA: Insufficient documentation

## 2024-06-17 DIAGNOSIS — E1136 Type 2 diabetes mellitus with diabetic cataract: Secondary | ICD-10-CM | POA: Diagnosis not present

## 2024-06-17 DIAGNOSIS — Z87891 Personal history of nicotine dependence: Secondary | ICD-10-CM | POA: Insufficient documentation

## 2024-06-17 DIAGNOSIS — E1122 Type 2 diabetes mellitus with diabetic chronic kidney disease: Secondary | ICD-10-CM | POA: Insufficient documentation

## 2024-06-17 DIAGNOSIS — N183 Chronic kidney disease, stage 3 unspecified: Secondary | ICD-10-CM | POA: Diagnosis not present

## 2024-06-17 DIAGNOSIS — Z794 Long term (current) use of insulin: Secondary | ICD-10-CM | POA: Diagnosis not present

## 2024-06-17 DIAGNOSIS — Z7984 Long term (current) use of oral hypoglycemic drugs: Secondary | ICD-10-CM | POA: Diagnosis not present

## 2024-06-17 DIAGNOSIS — I130 Hypertensive heart and chronic kidney disease with heart failure and stage 1 through stage 4 chronic kidney disease, or unspecified chronic kidney disease: Secondary | ICD-10-CM | POA: Diagnosis not present

## 2024-06-17 DIAGNOSIS — I5042 Chronic combined systolic (congestive) and diastolic (congestive) heart failure: Secondary | ICD-10-CM | POA: Insufficient documentation

## 2024-06-17 DIAGNOSIS — H2511 Age-related nuclear cataract, right eye: Secondary | ICD-10-CM | POA: Diagnosis present

## 2024-06-17 HISTORY — PX: ANTERIOR VITRECTOMY: SHX1173

## 2024-06-17 HISTORY — DX: Complete traumatic amputation of unspecified lower leg, level unspecified, initial encounter: S88.919A

## 2024-06-17 HISTORY — DX: Atherosclerosis of native arteries of extremities with intermittent claudication, right leg: I70.211

## 2024-06-17 HISTORY — DX: Peripheral vascular disease, unspecified: I73.9

## 2024-06-17 HISTORY — DX: Phantom limb syndrome with pain: G54.6

## 2024-06-17 HISTORY — DX: Type 2 diabetes mellitus with diabetic polyneuropathy: E11.42

## 2024-06-17 HISTORY — DX: Depression, unspecified: F32.A

## 2024-06-17 HISTORY — DX: Myoneural disorder, unspecified: G70.9

## 2024-06-17 HISTORY — DX: Gastro-esophageal reflux disease without esophagitis: K21.9

## 2024-06-17 HISTORY — DX: Type 2 diabetes mellitus with diabetic chronic kidney disease: E11.22

## 2024-06-17 HISTORY — DX: Nontraumatic intracranial hemorrhage, unspecified: I62.9

## 2024-06-17 HISTORY — DX: Acquired absence of left leg below knee: Z89.512

## 2024-06-17 HISTORY — PX: CATARACT EXTRACTION W/PHACO: SHX586

## 2024-06-17 LAB — GLUCOSE, CAPILLARY: Glucose-Capillary: 230 mg/dL — ABNORMAL HIGH (ref 70–99)

## 2024-06-17 SURGERY — PHACOEMULSIFICATION, CATARACT, WITH IOL INSERTION
Anesthesia: Monitor Anesthesia Care | Site: Eye | Laterality: Right

## 2024-06-17 MED ORDER — LACTATED RINGERS IV SOLN: INTRAVENOUS | Status: DC

## 2024-06-17 MED ORDER — TETRACAINE HCL 0.5 % OP SOLN
1.0000 [drp] | OPHTHALMIC | Status: DC | PRN
  Administered 2024-06-17 (×3): 1 [drp] via OPHTHALMIC

## 2024-06-17 MED ORDER — TRYPAN BLUE 0.06 % IO SOSY
PREFILLED_SYRINGE | INTRAOCULAR | Status: DC | PRN
  Administered 2024-06-17: .5 mL via INTRAOCULAR

## 2024-06-17 MED ORDER — BSS IO SOLN
INTRAOCULAR | Status: DC | PRN
  Administered 2024-06-17: 176 mL via OPHTHALMIC

## 2024-06-17 MED ORDER — NEOMYCIN-POLYMYXIN-DEXAMETH 3.5-10000-0.1 OP OINT
TOPICAL_OINTMENT | OPHTHALMIC | Status: DC | PRN
  Administered 2024-06-17: 1 via OPHTHALMIC

## 2024-06-17 MED ORDER — PROPOFOL 500 MG/50ML IV EMUL
INTRAVENOUS | Status: DC | PRN
  Administered 2024-06-17: 35 mg via INTRAVENOUS
  Administered 2024-06-17 (×2): 20 mg via INTRAVENOUS
  Administered 2024-06-17 (×2): 25 mg via INTRAVENOUS
  Administered 2024-06-17: 20 mg via INTRAVENOUS
  Administered 2024-06-17 (×3): 25 mg via INTRAVENOUS

## 2024-06-17 MED ORDER — PROPOFOL 10 MG/ML IV BOLUS
INTRAVENOUS | Status: AC
  Filled 2024-06-17: qty 20

## 2024-06-17 MED ORDER — TETRACAINE HCL 0.5 % OP SOLN
OPHTHALMIC | Status: AC
Start: 2024-06-17 — End: 2024-06-17
  Filled 2024-06-17: qty 4

## 2024-06-17 MED ORDER — EPINEPHRINE (ANAPHYLAXIS) 30 MG/30ML IJ SOLN
INTRAMUSCULAR | Status: DC | PRN
  Administered 2024-06-17: 1 mL

## 2024-06-17 MED ORDER — LIDOCAINE HCL (PF) 2 % IJ SOLN
INTRAMUSCULAR | Status: DC | PRN
  Administered 2024-06-17: 4 mL via INTRAOCULAR

## 2024-06-17 MED ORDER — SIGHTPATH DOSE#1 BSS IO SOLN
INTRAOCULAR | Status: DC | PRN
  Administered 2024-06-17: 15 mL via INTRAOCULAR

## 2024-06-17 MED ORDER — FLUMAZENIL NICU IV SYRINGE 0.1 MG/ML
0.2000 mg | INTRAVENOUS | Status: DC | PRN
  Administered 2024-06-17: 0.2 mg via INTRAVENOUS

## 2024-06-17 MED ORDER — CYCLOPENTOLATE HCL 2 % OP SOLN
1.0000 [drp] | OPHTHALMIC | Status: AC
  Administered 2024-06-17 (×3): 1 [drp] via OPHTHALMIC

## 2024-06-17 MED ORDER — PROPOFOL 10 MG/ML IV BOLUS
INTRAVENOUS | Status: AC
Start: 2024-06-17 — End: 2024-06-17
  Filled 2024-06-17: qty 20

## 2024-06-17 MED ORDER — SIGHTPATH DOSE#1 NA HYALUR & NA CHOND-NA HYALUR IO KIT
PACK | INTRAOCULAR | Status: DC | PRN
  Administered 2024-06-17: 1 via OPHTHALMIC

## 2024-06-17 MED ORDER — CYCLOPENTOLATE HCL 2 % OP SOLN
OPHTHALMIC | Status: AC
  Filled 2024-06-17: qty 2

## 2024-06-17 MED ORDER — FLUMAZENIL 0.5 MG/5ML IV SOLN
INTRAVENOUS | Status: AC
  Filled 2024-06-17: qty 5

## 2024-06-17 MED ORDER — MIDAZOLAM HCL 2 MG/2ML IJ SOLN
INTRAMUSCULAR | Status: AC
  Filled 2024-06-17: qty 2

## 2024-06-17 MED ORDER — PHENYLEPHRINE HCL 10 % OP SOLN
1.0000 [drp] | OPHTHALMIC | Status: AC
  Administered 2024-06-17 (×3): 1 [drp] via OPHTHALMIC

## 2024-06-17 MED ORDER — MIDAZOLAM HCL (PF) 2 MG/2ML IJ SOLN
INTRAMUSCULAR | Status: DC | PRN
  Administered 2024-06-17 (×2): 1 mg via INTRAVENOUS

## 2024-06-17 MED ORDER — SIGHTPATH DOSE#1 NA CHONDROIT SULF-NA HYALURON 20-15 MG/0.5ML IO SOSY
INTRAOCULAR | Status: DC | PRN
  Administered 2024-06-17 (×2): .5 mL via INTRAOCULAR

## 2024-06-17 MED ORDER — ACETYLCHOLINE CHLORIDE 20 MG IO SOLR
INTRAOCULAR | Status: DC | PRN
  Administered 2024-06-17: 20 mg via INTRAOCULAR

## 2024-06-17 MED ORDER — MOXIFLOXACIN HCL 0.5 % OP SOLN
OPHTHALMIC | Status: DC | PRN
  Administered 2024-06-17: .2 mL via OPHTHALMIC

## 2024-06-17 MED ORDER — PHENYLEPHRINE HCL 10 % OP SOLN
OPHTHALMIC | Status: AC
Start: 2024-06-17 — End: 2024-06-17
  Filled 2024-06-17: qty 5

## 2024-06-17 MED ORDER — SIGHTPATH DOSE#1 SODIUM HYALURONATE 10 MG/ML IO SOLUTION
PREFILLED_SYRINGE | INTRAOCULAR | Status: DC | PRN
  Administered 2024-06-17: .85 mL via INTRAOCULAR

## 2024-06-17 MED ORDER — BRIMONIDINE TARTRATE-TIMOLOL 0.2-0.5 % OP SOLN
OPHTHALMIC | Status: DC | PRN
  Administered 2024-06-17: 1 [drp] via OPHTHALMIC

## 2024-06-17 MED ORDER — FENTANYL CITRATE (PF) 100 MCG/2ML IJ SOLN
INTRAMUSCULAR | Status: DC | PRN
  Administered 2024-06-17 (×2): 50 ug via INTRAVENOUS

## 2024-06-17 MED ORDER — FENTANYL CITRATE (PF) 100 MCG/2ML IJ SOLN
INTRAMUSCULAR | Status: AC
  Filled 2024-06-17: qty 2

## 2024-06-17 SURGICAL SUPPLY — 17 items
BNDG EYE OVAL 2 1/8 X 2 5/8 (GAUZE/BANDAGES/DRESSINGS) IMPLANT
CANNULA ANT/CHMB 27G (MISCELLANEOUS) IMPLANT
CANNULA HYDRODISSEC NUC 25X7/8 (MISCELLANEOUS) ×1 IMPLANT
CUTTER IOL 19 GA PARKER CHANG (INSTRUMENTS) IMPLANT
DRSG TEGADERM 2-3/8X2-3/4 SM (GAUZE/BANDAGES/DRESSINGS) ×1 IMPLANT
FEE CATARACT SUITE SIGHTPATH (MISCELLANEOUS) ×1 IMPLANT
FORCEPS MICRO-HOLDING 23GA (INSTRUMENTS) IMPLANT
GLOVE BIOGEL PI IND STRL 8 (GLOVE) ×1 IMPLANT
GLOVE SURG LX STRL 7.5 STRW (GLOVE) ×1 IMPLANT
GLOVE SURG SYN 6.5 PF PI BL (GLOVE) ×1 IMPLANT
LENS IOL ACRSF MP 18.5 (Intraocular Lens) IMPLANT
LENS IOL CLRN CLEAR 19.5 (Intraocular Lens) IMPLANT
NDL FILTER BLUNT 18X1 1/2 (NEEDLE) ×1 IMPLANT
NEEDLE FILTER BLUNT 18X1 1/2 (NEEDLE) ×1 IMPLANT
PACK VIT ANT 23G (MISCELLANEOUS) IMPLANT
SUTURE EHLN 10-0 CS-B-6CS-B-6 (SUTURE) IMPLANT
SYR 3ML LL SCALE MARK (SYRINGE) ×1 IMPLANT

## 2024-06-17 NOTE — Anesthesia Postprocedure Evaluation (Signed)
 Anesthesia Post Note  Patient: Danny HERO Coppa Sr.  Procedure(s) Performed: PHACOEMULSIFICATION, CATARACT, WITH IOL INSERTION 42.24 03:09.2 (Right: Eye) VITRECTOMY, ANTERIOR (Right: Eye)  Patient location during evaluation: PACU Anesthesia Type: MAC Level of consciousness: awake and alert Pain management: pain level controlled Vital Signs Assessment: post-procedure vital signs reviewed and stable Respiratory status: spontaneous breathing, nonlabored ventilation, respiratory function stable and patient connected to nasal cannula oxygen  Cardiovascular status: stable and blood pressure returned to baseline Postop Assessment: no apparent nausea or vomiting Anesthetic complications: no   No notable events documented.   Last Vitals:  Vitals:   06/17/24 1324 06/17/24 1330  BP:  (!) 129/92  Pulse: 81 75  Resp: 13 15  Temp:  (!) 36.3 C  SpO2: 93% 96%    Last Pain:  Vitals:   06/17/24 1330  TempSrc:   PainSc: 0-No pain                 Fayrene Towner C Stepfanie Yott

## 2024-06-17 NOTE — H&P (Signed)
 [facility]   Primary Care Physician:  Rilla Baller, MD Ophthalmologist: Dr. Curtistine Fava  Pre-Procedure History & Physical: HPI:  Danny Rogerson Garbutt Sr. is a 73 y.o. male here for cataract surgery.   Past Medical History:  Diagnosis Date   Amputation of lower extremity (HCC)    left   Anxiety and depression    Atherosclerosis of native artery of right lower extremity with intermittent claudication    CAD (coronary artery disease)    a. 01/2010 PCI of LAD; b. 09/2014 PCI/DES of mLAD due to ISR. D1 80, D2 80(jailed), LCX 46m; c. 07/2016 NSTEMI/Cath: LM nl, LAD 16m ISR, 40d, D1 90ost, 80p, D2 90ost, RI min irregs, LCX min irregs, OM1 90 small, OM2/3 min irregs, RCA min irregs, RPLB1 90, EF 25-35%.   Cellulitis of left foot 11/09/2020   Chronic combined systolic and diastolic CHF (congestive heart failure) (HCC)    a. 07/2016 Echo: EF 30%, severe septal/anterior HK, Gr1 DD.   CKD (chronic kidney disease), stage III (HCC)    CKD stage 3 due to type 2 diabetes mellitus (HCC)    COPD (chronic obstructive pulmonary disease) (HCC)    Diabetic polyneuropathy associated with type 2 diabetes mellitus (HCC)    DM2 (diabetes mellitus, type 2) (HCC)    Erectile dysfunction    GERD (gastroesophageal reflux disease)    HLD (hyperlipidemia)    HTN (hypertension)    Hx of left BKA (HCC)    Intracranial hemorrhage (HCC)    Ischemic cardiomyopathy    a. 07/2016 Echo: EF 30% w/ sev septal/ant HK. Gr1 DD.   Neuromuscular disorder (HCC)    neuropathy   Peripheral vascular disease    Phantom pain after amputation of lower extremity [facility])     Past Surgical History:  Procedure Laterality Date   AMPUTATION Left 11/10/2020   Procedure: AMPUTATION BELOW KNEE;  Surgeon: Marea Selinda RAMAN, MD;  Location: [facility] ORS;  Service: General;  Laterality: Left;   BELOW KNEE LEG AMPUTATION     CAD: stent to the LAD     CARDIAC CATHETERIZATION  10/01/2014   CARDIAC CATHETERIZATION N/A 07/23/2016   Procedure:  Left Heart Cath and Coronary Angiography;  Surgeon: Deatrice DELENA Cage, MD;  Location: [facility] INVASIVE CV LAB;  Service: Cardiovascular;  Laterality: N/A;   CORONARY ANGIOPLASTY WITH STENT PLACEMENT  10/01/2014   LOWER EXTREMITY ANGIOGRAPHY Left 10/31/2020   Procedure: LOWER EXTREMITY ANGIOGRAPHY;  Surgeon: Marea Selinda RAMAN, MD;  Location: [facility] INVASIVE CV LAB;  Service: Cardiovascular;  Laterality: Left;   LOWER EXTREMITY ANGIOGRAPHY Right 11/20/2021   Procedure: Lower Extremity Angiography;  Surgeon: Marea Selinda RAMAN, MD;  Location: [facility] INVASIVE CV LAB;  Service: Cardiovascular;  Laterality: Right;   LOWER EXTREMITY ANGIOGRAPHY Right 02/27/2024   Procedure: Lower Extremity Angiography;  Surgeon: Marea Selinda RAMAN, MD;  Location: [facility] INVASIVE CV LAB;  Service: Cardiovascular;  Laterality: Right;    Prior to Admission medications   Medication Sig Start Date End Date Taking? Authorizing Provider  acetaminophen  (TYLENOL ) 325 MG tablet Take 2 tablets (650 mg total) by mouth every 4 (four) hours as needed for mild pain (or temp > 37.5 C (99.5 F)). 10/10/21  Yes Ghimire, Kuber, MD  amLODipine  (NORVASC ) 10 MG tablet TAKE 1 TABLET BY MOUTH ONCE DAILY 01/28/24  Yes Rilla Baller, MD  aspirin  EC 81 MG tablet Take 1 tablet (81 mg total) by mouth daily. 02/25/23  Yes Maribeth Camellia MATSU, MD  atorvastatin  (LIPITOR ) 80 MG tablet Take 1  tablet (80 mg total) by mouth daily. 01/27/24  Yes Rilla Baller, MD  carvedilol  (COREG ) 25 MG tablet Take 1 tablet (25 mg total) by mouth 2 (two) times daily with a meal. 01/27/24  Yes Rilla Baller, MD  clopidogrel  (PLAVIX ) 75 MG tablet TAKE 1 TABLET BY MOUTH ONCE DAILY 12/18/23  Yes Dew, Selinda RAMAN, MD  Continuous Glucose Receiver (FREESTYLE LIBRE 2 READER) DEVI 1 Device by Does not apply route 4 (four) times daily. 05/14/23  Yes Maribeth Camellia MATSU, MD  empagliflozin  (JARDIANCE ) 25 MG TABS tablet Take 1 tablet (25 mg total) by mouth daily. 01/27/24  Yes Rilla Baller, MD  escitalopram   (LEXAPRO ) 10 MG tablet Take 1 tablet (10 mg total) by mouth daily. 01/27/24  Yes Rilla Baller, MD  Insulin  Pen Needle (ULTRACARE PEN NEEDLES) 32G X 6 MM MISC USE AS DIRECTED. TWICE DAILY 06/21/23  Yes Maribeth Camellia MATSU, MD  spironolactone  (ALDACTONE ) 25 MG tablet Take 1 tablet (25 mg total) by mouth daily. 01/27/24  Yes Rilla Baller, MD  tamsulosin  (FLOMAX ) 0.4 MG CAPS capsule TAKE 1 CAPSULE BY MOUTH ONCE DAILY 01/28/24  Yes Rilla Baller, MD  tirzepatide  (MOUNJARO ) 7.5 MG/0.5ML Pen Inject 7.5 mg into the skin once a week.   Yes [provider]  umeclidinium-vilanterol (ANORO ELLIPTA ) 62.5-25 MCG/ACT AEPB Inhale 1 puff into the lungs daily. Patient taking differently: Inhale 1 puff into the lungs daily. Per pt, takes prn 01/27/24  Yes Rilla Baller, MD  albuterol  (PROVENTIL ) (2.5 MG/3ML) 0.083% nebulizer solution Take 3 mLs (2.5 mg total) by nebulization every 6 (six) hours as needed for wheezing or shortness of breath. 09/30/20   Maribeth Camellia MATSU, MD  albuterol  (VENTOLIN  HFA) 108 (90 Base) MCG/ACT inhaler Inhale 2 puffs into the lungs every 6 (six) hours as needed for wheezing or shortness of breath. 02/18/23   Claudene Rover, MD  Continuous Glucose Sensor (FREESTYLE [place] 2 SENSOR) MISC Check blood sugars 03/25/23   Maribeth Camellia MATSU, MD  furosemide  (LASIX ) 20 MG tablet Take 1 tablet (20 mg total) by mouth daily as needed for edema. 12/27/23 03/26/24  Lorene Lesley CROME, PA-C  nitroGLYCERIN  (NITROSTAT ) 0.4 MG SL tablet Place 0.4 mg under the tongue every 5 (five) minutes as needed for chest pain.    [provider]    Allergies as of 05/25/2024 - Review Complete 04/06/2024  Allergen Reaction Noted   Metformin  and related Diarrhea 11/20/2021    Family History  Problem Relation Age of Onset   Heart attack Father        complications   Cancer Brother     Social History   Socioeconomic History   Marital status: Divorced    Spouse name: Not on file   Number of  children: Not on file   Years of education: Not on file   Highest education level: Not on file  Occupational History   Not on file  Tobacco Use   Smoking status: Former    Current packs/day: 0.00    Types: Cigarettes    Start date: 09/07/2009    Quit date: 09/07/2009    Years since quitting: 14.7   Smokeless tobacco: Never   Tobacco comments:    quit june 2011  Vaping Use   Vaping status: Never Used  Substance and Sexual Activity   Alcohol use: Not Currently   Drug use: Never   Sexual activity: Not Currently  Other Topics Concern   Not on file  Social History Narrative   divorced  Social Drivers of Corporate Investment Banker Strain: Low Risk  (04/24/2023)   Overall Financial Resource Strain (CARDIA)    Difficulty of Paying Living Expenses: Not hard at all  Food Insecurity: Patient Declined (02/27/2024)   Hunger Vital Sign    Worried About Running Out of Food in the Last Year: Patient declined    Ran Out of Food in the Last Year: Patient declined  Transportation Needs: Patient Declined (02/27/2024)   PRAPARE - Administrator, Civil Service (Medical): Patient declined    Lack of Transportation (Non-Medical): Patient declined  Physical Activity: Inactive (04/24/2023)   Exercise Vital Sign    Days of Exercise per Week: 0 days    Minutes of Exercise per Session: 0 min  Stress: Stress Concern Present (04/24/2023)   [employer] of Occupational Health - Occupational Stress Questionnaire    Feeling of Stress : To some extent  Social Connections: Unknown (02/27/2024)   Social Connection and Isolation Panel    Frequency of Communication with Friends and Family: Patient declined    Frequency of Social Gatherings with Friends and Family: Patient declined    Attends Religious Services: Patient declined    Database Administrator or Organizations: Patient declined    Attends Banker Meetings: Patient declined    Marital Status: Divorced  Catering Manager  Violence: Patient Declined (02/27/2024)   Humiliation, Afraid, Rape, and Kick questionnaire    Fear of Current or Ex-Partner: Patient declined    Emotionally Abused: Patient declined    Physically Abused: Patient declined    Sexually Abused: Patient declined    Review of Systems: See HPI, otherwise negative ROS  Physical Exam: BP (!) 145/88   Pulse 85   Temp 98 F (36.7 C) (Temporal)   Resp 18   Ht 6' (1.829 m)   Wt 96.6 kg   SpO2 93%   BMI 28.89 kg/m  General:   Alert, cooperative in NAD Head:  Normocephalic and atraumatic. Respiratory:  Normal work of breathing. Cardiovascular:  RRR  Impression/Plan: Danny HERO Regis Sr. is here for cataract surgery.  Risks, benefits, limitations, and alternatives regarding cataract surgery have been reviewed with the patient.  Questions have been answered.  All parties agreeable.   Curtistine JINNY Fava, MD  06/17/2024, 11:01 AM

## 2024-06-17 NOTE — Transfer of Care (Signed)
 Immediate Anesthesia Transfer of Care Note  Patient: Danny HERO Branch Sr.  Procedure(s) Performed: PHACOEMULSIFICATION, CATARACT, WITH IOL INSERTION 42.24 03:09.2 (Right: Eye) VITRECTOMY, ANTERIOR (Right: Eye)  Patient Location: PACU  Anesthesia Type: MAC  Level of Consciousness: awake, alert  and patient cooperative  Airway and Oxygen  Therapy: Patient Spontanous Breathing and Patient connected to supplemental oxygen   Post-op Assessment: Post-op Vital signs reviewed, Patient's Cardiovascular Status Stable, Respiratory Function Stable, Patent Airway and No signs of Nausea or vomiting  Post-op Vital Signs: Reviewed and stable  Complications: No notable events documented.

## 2024-06-17 NOTE — Op Note (Signed)
 PREOPERATIVE DIAGNOSIS:  Nuclear sclerotic cataract of the right eye.   POSTOPERATIVE DIAGNOSIS:  Right Eye Cataract   OPERATIVE PROCEDURE:ORPROCALL@   SURGEON:  Curtistine Fava, MD   ANESTHESIA:  Anesthesiologist: Ola Donny BROCKS, MD CRNA: Levy Harvey, CRNA; Liliane Ports, CRNA  Monitored anesthesia care. Topical tetracaine drops followed by 2% Xylocaine  jelly applied in the preoperative holding area 0.44ml of epi-Shugarcaine was instilled in the eye following the paracentesis.   COMPLICATIONS:  None.   TECHNIQUE:   Phacoemulsification divide and conquer   DESCRIPTION OF PROCEDURE:  The patient was examined and consented in the preoperative holding area where the aforementioned topical anesthesia was applied to the right eye and then brought back to the Operating Room where the right eye was prepped and draped in the usual sterile ophthalmic fashion and a lid speculum was placed. A paracentesis was created with the side port blade and the anterior chamber was filled with epi-Shugarcaine follower by viscoelastic. A clear corneal incision was performed with the steel keratome. Trypan Blue was used to stain the anterior capsule due to very poor/no visualization of the red reflex. A continuous curvilinear capsulorrhexis was performed with a cystotome followed by the capsulorrhexis forceps. Hydrodissection and hydrodelineation were carried out with BSS on a blunt cannula. The lens was removed in a divide and conquer technique. At this point a round hole was noted in the posterior capsule without anterior vitreous migration. A one piece IOL was attempted to be placed into the capsular bag, but the posterior tear extended and the IOL was unstable. The IOL was cut and removed from the eye. Anterior vitrectomy was performed and all remaining cortical material was removed, as well as a thorough anterior vitrectomy. The ciliary sulcus was filled with viscoelastic and a 3 piece MA60AC + 18.5D lens was placed  into the ciliary sulcus without complication. A 10-0 nylon suture was placed at the main incision, with knot buried. The viscoelastic was removed from the anterior chamber. And any remaining anterior vitreous was removed. The anterior chamber was flushed with BSS and the eye was inflated to physiologic pressure. Miochol was injected into the anterior chamber. 0.1ml of Vigamox was placed in the anterior chamber. The wounds were found to be water tight. At the end of the surgery the pupil was round and they eye was physiologic pressure. The eye was dressed with Combigan and covered with a clear shield to be worn until the first postoperative day appointment. The patient was given protective glasses to wear throughout the day. The patient was also given drops with which to begin a drop regimen today and will follow-up with me in one day. Implant Name Type Inv. Item Serial No. Manufacturer Lot No. LRB No. Used Action  LENS IOL CLRN CLEAR 19.5 - [id number] Intraocular Lens LENS IOL CLRN CLEAR 19.5 [id number] [employer]  Right 1 Implanted and Explanted  LENS IOL ACRSF MP 18.5 - [id number] Intraocular Lens LENS IOL ACRSF MP 18.5 [id number] [employer]  Right 1 Implanted   Procedure(s): PHACOEMULSIFICATION, CATARACT, WITH IOL INSERTION 42.24 03:09.2 (Right) VITRECTOMY, ANTERIOR (Right)  Electronically signed: Curtistine PARAS [facility] 06/17/2024 2:45 PM

## 2024-06-24 NOTE — Anesthesia Preprocedure Evaluation (Signed)
 Anesthesia Evaluation    Airway Mallampati: III  TM Distance: <3 FB     Dental  (+) Edentulous Upper, Edentulous Lower   Pulmonary former smoker          Cardiovascular hypertension,   12There is moderate to severe left ventricular systolic dysfunction. The left ventricular ejection fraction is 25-35% by visual estimate. There is trivial (1+) mitral regurgitation. There is no aortic valve stenosis. Mid LAD lesion, 20 %stenosed. Ost 2nd Diag to 2nd Diag lesion, 90 %stenosed. Dist LAD lesion, 40 %stenosed. Ost 1st Diag lesion, 90 %stenosed. 1st Diag lesion, 80 %stenosed. 1st Mrg lesion, 90 %stenosed. 1st RPLB lesion, 90 %stenosed.   1. No evidence of obstructive disease affecting the main arteries. Patent mid LAD stent with mild in-stent restenosis and moderate LAD disease distally. The patient does have diffuse small vessel branch disease affecting 2 diagonals, OM1 and PL 1. The disease is distal. 2. Moderately to severely reduced LV systolic function with an EF of 25-30% with global hypokinesis. 3. Moderately elevated filling pressures.   Recommendations: Compared to his most recent cardiac catheterization in 2016, the ejection fraction has decreased significantly. The patient seems to have mixed ischemic/ nonischemic cardiomyopathy. Continue medical therapy for coronary artery disease. Optimize heart failure treatment. I increased the dose of lisinopril . The patient is volume overloaded and I started oral furosemide . I'm going to avoid aggressive diuresis due to contrast exposure. Consider screening for sleep apnea if that has not been done in the past.   -18-17 cath   12-06-20 echo Sonographer Comments: Technically difficult study due to poor echo windows  and suboptimal parasternal window.  IMPRESSIONS       1. Left ventricular ejection fraction, by estimation, is 35 to 40%. The  left ventricle has moderately  decreased function. The left ventricle  demonstrates global hypokinesis. Left ventricular diastolic parameters are  indeterminate.   2. Right ventricular systolic function is normal. The right ventricular  size is normal.   3. The mitral valve was not well visualized. No evidence of mitral valve  regurgitation.   4. The aortic valve was not well visualized. Aortic valve regurgitation  is not visualized. No aortic stenosis is present.   5. The inferior vena cava is normal in size with greater than 50%  respiratory variability, suggesting right atrial pressure of 3 mmHg.       Neuro/Psych    GI/Hepatic   Endo/Other  diabetes    Renal/GU      Musculoskeletal   Abdominal   Peds  Hematology   Anesthesia Other Findings Previous cataract surgery 06-17-24 Dr. Ola  Medical History  DM2 (diabetes mellitus, type 2) (HCC) HLD (hyperlipidemia) HTN (hypertension) CAD (coronary artery disease) COPD (chronic obstructive pulmonary disease) (HCC) Ischemic cardiomyopathy Chronic combined systolic and diastolic CHF (congestive heart failure) (HCC) CKD (chronic kidney disease), stage III (HCC) Erectile dysfunction Cellulitis of left foot Peripheral vascular disease Intracranial hemorrhage (HCC) GERD (gastroesophageal reflux disease) Neuromuscular disorder (HCC) Amputation of lower extremity (HCC) Hx of left BKA (HCC) Diabetic polyneuropathy associated with type 2 diabetes mellitus (HCC) CKD stage 3 due to type 2 diabetes mellitus (HCC) Anxiety and depression Phantom pain after amputation of lower extremity (HCC) Atherosclerosis of native artery of right lower extremity with intermittent claudication     Reproductive/Obstetrics                              Anesthesia Physical Anesthesia Plan  ASA:  4  Anesthesia Plan: MAC   Post-op Pain Management:    Induction: Intravenous  PONV Risk Score and Plan:   Airway Management Planned: Natural Airway and  Nasal Cannula  Additional Equipment:   Intra-op Plan:   Post-operative Plan:   Informed Consent: I have reviewed the patients History and Physical, chart, labs and discussed the procedure including the risks, benefits and alternatives for the proposed anesthesia with the patient or authorized representative who has indicated his/her understanding and acceptance.     Dental Advisory Given  Plan Discussed with: Anesthesiologist, CRNA and Surgeon  Anesthesia Plan Comments: (Patient consented for risks of anesthesia including but not limited to:  - adverse reactions to medications - damage to eyes, teeth, lips or other oral mucosa - nerve damage due to positioning  - sore throat or hoarseness - Damage to heart, brain, nerves, lungs, other parts of body or loss of life  Patient voiced understanding and assent.)         Anesthesia Quick Evaluation

## 2024-07-08 ENCOUNTER — Ambulatory Visit: Admit: 2024-07-08 | Admitting: Ophthalmology

## 2024-07-08 ENCOUNTER — Encounter: Payer: Self-pay | Admitting: Anesthesiology

## 2024-07-08 SURGERY — PHACOEMULSIFICATION, CATARACT, WITH IOL INSERTION
Anesthesia: Topical | Laterality: Left

## 2024-08-18 ENCOUNTER — Emergency Department
Admission: EM | Admit: 2024-08-18 | Discharge: 2024-08-18 | Disposition: A | Discharge status: Home / Self Care | Attending: Emergency Medicine | Admitting: Emergency Medicine

## 2024-08-18 ENCOUNTER — Emergency Department

## 2024-08-18 ENCOUNTER — Encounter: Payer: Self-pay | Admitting: Emergency Medicine

## 2024-08-18 ENCOUNTER — Other Ambulatory Visit: Payer: Self-pay

## 2024-08-18 DIAGNOSIS — J441 Chronic obstructive pulmonary disease with (acute) exacerbation: Secondary | ICD-10-CM | POA: Diagnosis not present

## 2024-08-18 DIAGNOSIS — I509 Heart failure, unspecified: Secondary | ICD-10-CM | POA: Insufficient documentation

## 2024-08-18 DIAGNOSIS — N189 Chronic kidney disease, unspecified: Secondary | ICD-10-CM | POA: Diagnosis not present

## 2024-08-18 DIAGNOSIS — I130 Hypertensive heart and chronic kidney disease with heart failure and stage 1 through stage 4 chronic kidney disease, or unspecified chronic kidney disease: Secondary | ICD-10-CM | POA: Insufficient documentation

## 2024-08-18 DIAGNOSIS — I2510 Atherosclerotic heart disease of native coronary artery without angina pectoris: Secondary | ICD-10-CM | POA: Diagnosis not present

## 2024-08-18 DIAGNOSIS — E119 Type 2 diabetes mellitus without complications: Secondary | ICD-10-CM | POA: Insufficient documentation

## 2024-08-18 DIAGNOSIS — R059 Cough, unspecified: Secondary | ICD-10-CM | POA: Diagnosis present

## 2024-08-18 LAB — CBC WITH DIFFERENTIAL/PLATELET
Abs Immature Granulocytes: 0.07 K/uL (ref 0.00–0.07)
Basophils Absolute: 0.1 K/uL (ref 0.0–0.1)
Basophils Relative: 1 %
Eosinophils Absolute: 0.7 K/uL — ABNORMAL HIGH (ref 0.0–0.5)
Eosinophils Relative: 8 %
HCT: 48 % (ref 39.0–52.0)
Hemoglobin: 15.3 g/dL (ref 13.0–17.0)
Immature Granulocytes: 1 %
Lymphocytes Relative: 23 %
Lymphs Abs: 2.1 K/uL (ref 0.7–4.0)
MCH: 31.1 pg (ref 26.0–34.0)
MCHC: 31.9 g/dL (ref 30.0–36.0)
MCV: 97.6 fL (ref 80.0–100.0)
Monocytes Absolute: 1 K/uL (ref 0.1–1.0)
Monocytes Relative: 11 %
Neutro Abs: 5.3 K/uL (ref 1.7–7.7)
Neutrophils Relative %: 56 %
Platelets: 253 K/uL (ref 150–400)
RBC: 4.92 MIL/uL (ref 4.22–5.81)
RDW: 12.8 % (ref 11.5–15.5)
WBC: 9.2 K/uL (ref 4.0–10.5)
nRBC: 0 % (ref 0.0–0.2)

## 2024-08-18 LAB — BASIC METABOLIC PANEL WITH GFR
Anion gap: 9 (ref 5–15)
BUN: 20 mg/dL (ref 8–23)
CO2: 23 mmol/L (ref 22–32)
Calcium: 8.8 mg/dL — ABNORMAL LOW (ref 8.9–10.3)
Chloride: 105 mmol/L (ref 98–111)
Creatinine, Ser: 1.08 mg/dL (ref 0.61–1.24)
GFR, Estimated: >60 mL/min
Glucose, Bld: 262 mg/dL — ABNORMAL HIGH (ref 70–99)
Potassium: 4.1 mmol/L (ref 3.5–5.1)
Sodium: 137 mmol/L (ref 135–145)

## 2024-08-18 MED ORDER — IPRATROPIUM-ALBUTEROL 0.5-2.5 (3) MG/3ML IN SOLN
3.0000 mL | Freq: Once | RESPIRATORY_TRACT | Status: AC
  Administered 2024-08-18: 3 mL via RESPIRATORY_TRACT
  Filled 2024-08-18: qty 3

## 2024-08-18 MED ORDER — PREDNISONE 20 MG PO TABS: 60.0000 mg | ORAL_TABLET | Freq: Every day | ORAL | 0 refills | Status: DC

## 2024-08-18 MED ORDER — ALBUTEROL SULFATE HFA 108 (90 BASE) MCG/ACT IN AERS: 2.0000 | INHALATION_SPRAY | Freq: Four times a day (QID) | RESPIRATORY_TRACT | 2 refills | Status: DC | PRN

## 2024-08-18 MED ORDER — ALBUTEROL SULFATE HFA 108 (90 BASE) MCG/ACT IN AERS: 2.0000 | INHALATION_SPRAY | Freq: Four times a day (QID) | RESPIRATORY_TRACT | 2 refills | Status: AC | PRN

## 2024-08-18 MED ORDER — PREDNISONE 20 MG PO TABS
60.0000 mg | ORAL_TABLET | Freq: Once | ORAL | Status: AC
  Administered 2024-08-18: 60 mg via ORAL
  Filled 2024-08-18: qty 3

## 2024-08-18 MED ORDER — PREDNISONE 20 MG PO TABS: 60.0000 mg | ORAL_TABLET | Freq: Every day | ORAL | 0 refills | Status: AC

## 2024-08-18 MED ORDER — DOXYCYCLINE HYCLATE 100 MG PO CAPS: 100.0000 mg | ORAL_CAPSULE | Freq: Two times a day (BID) | ORAL | 0 refills | Status: AC

## 2024-08-18 MED ORDER — DOXYCYCLINE HYCLATE 100 MG PO CAPS: 100.0000 mg | ORAL_CAPSULE | Freq: Two times a day (BID) | ORAL | 0 refills | Status: DC

## 2024-08-18 NOTE — ED Notes (Signed)
Patient given a urinal.

## 2024-08-18 NOTE — ED Notes (Signed)
 [facility] called for transport to [facility] , spoke to [employer]

## 2024-08-18 NOTE — ED Notes (Signed)
 Patient transported to imaging.

## 2024-08-18 NOTE — ED Triage Notes (Signed)
 BIB [facility] from [facility].  C?O cough cough and congestion x 3 days. CP started this morning.  153/88 88 98% Lungs CTA. CBG 156

## 2024-08-18 NOTE — ED Provider Notes (Signed)
 "  [facility] Provider Note    Event Date/Time   First MD Initiated Contact with Patient 08/18/24 [phone]     (approximate)   History   Chief Complaint Cough   HPI  Danny DEJARNETTE Sr. is a 74 y.o. male with past medical history of hypertension, hyperlipidemia, CAD, CKD, diabetes, and CHF who presents to the ED complaining of cough.  Patient reports that he has had 2 weeks of worsening cough that is productive of greenish sputum.  He reports soreness in his chest when he goes to cough and some mild difficulty breathing.  He denies any fevers or chills, has had a prior left AKA but has not noticed any pain or swelling in his right leg.  He is not aware of any sick contacts.     Physical Exam   Triage Vital Signs: ED Triage Vitals  Encounter Vitals Group     BP 08/18/24 0932 100/67     Girls Systolic BP Percentile --      Girls Diastolic BP Percentile --      Boys Systolic BP Percentile --      Boys Diastolic BP Percentile --      Pulse Rate 08/18/24 0932 87     Resp 08/18/24 0932 17     Temp 08/18/24 0932 97.9 F (36.6 C)     Temp Source 08/18/24 0932 Oral     SpO2 08/18/24 0932 94 %     Weight --      Height --      Head Circumference --      Peak Flow --      Pain Score 08/18/24 0933 5     Pain Loc --      Pain Education --      Exclude from Growth Chart --     Most recent vital signs: Vitals:   08/18/24 0932 08/18/24 1339  BP: 100/67 (!) 145/95  Pulse: 87 80  Resp: 17 17  Temp: 97.9 F (36.6 C) (!) 97.5 F (36.4 C)  SpO2: 94% 97%    Constitutional: Alert and oriented. Eyes: Conjunctivae are normal. Head: Atraumatic. Nose: No congestion/rhinnorhea. Mouth/Throat: Mucous membranes are moist.  Cardiovascular: Normal rate, regular rhythm. Grossly normal heart sounds.  2+ radial pulses bilaterally. Respiratory: Normal respiratory effort.  No retractions. Lungs with expiratory wheezing throughout.. Gastrointestinal: Soft and nontender.  No distention. Musculoskeletal: No lower extremity tenderness nor edema.  Neurologic:  Normal speech and language. No gross focal neurologic deficits are appreciated.    ED Results / Procedures / Treatments   Labs (all labs ordered are listed, but only abnormal results are displayed) Labs Reviewed  CBC WITH DIFFERENTIAL/PLATELET - Abnormal; Notable for the following components:      Result Value   Eosinophils Absolute 0.7 (*)    All other components within normal limits  BASIC METABOLIC PANEL WITH GFR - Abnormal; Notable for the following components:   Glucose, Bld 262 (*)    Calcium  8.8 (*)    All other components within normal limits     EKG  ED ECG REPORT I, Carlin Palin, the attending physician, personally viewed and interpreted this ECG.   Date: 08/18/2024  EKG Time: 10:39  Rate: 82  Rhythm: normal sinus rhythm  Axis: Normal  Intervals:none  ST&T Change: None  RADIOLOGY Chest x-ray reviewed and interpreted by me with no infiltrate, edema, or effusion.  PROCEDURES:  Critical Care performed: No  Procedures   MEDICATIONS ORDERED IN  ED: Medications  ipratropium-albuterol  (DUONEB) 0.5-2.5 (3) MG/3ML nebulizer solution 3 mL (3 mLs Nebulization Given 08/18/24 1027)  predniSONE  (DELTASONE ) tablet 60 mg (60 mg Oral Given 08/18/24 1145)  ipratropium-albuterol  (DUONEB) 0.5-2.5 (3) MG/3ML nebulizer solution 3 mL (3 mLs Nebulization Given 08/18/24 1145)  ipratropium-albuterol  (DUONEB) 0.5-2.5 (3) MG/3ML nebulizer solution 3 mL (3 mLs Nebulization Given 08/18/24 1330)     IMPRESSION / MDM / ASSESSMENT AND PLAN / ED COURSE  I reviewed the triage vital signs and the nursing notes.                              74 y.o. male with past medical history of hypertension, hyperlipidemia, diabetes, CAD, CHF, and CKD who presents to the ED complaining of 2 weeks of productive cough with mild difficulty breathing.  Patient's presentation is most consistent with acute presentation  with potential threat to life or bodily function.  Differential diagnosis includes, but is not limited to, ACS, PE, pneumonia, pneumothorax, bronchitis, COPD, CHF, anemia, electrolyte abnormality, AKI.  Patient nontoxic-appearing and in no acute distress, vital signs are unremarkable.  He is not in any respiratory distress but does have expiratory wheezing throughout.  We will treat with DuoNeb and further assess with EKG, chest x-ray, and labs.  Labs without significant anemia, leukocytosis, electrolyte abnormality, or AKI.  EKG shows no evidence of arrhythmia or ischemia and I doubt ACS.  Symptoms consistent with COPD exacerbation, no evidence of CHF.  Patient had continued improvement in his symptoms following prednisone  and multiple DuoNebs.  He continues to breathe comfortably on room air and is appropriate for discharge back to his nursing facility.  Will prescribe course of prednisone  and doxycycline  in addition to albuterol  as needed.  He was counseled to follow-up with his PCP and to return to the ED for new or worsening symptoms, patient agrees with plan.      FINAL CLINICAL IMPRESSION(S) / ED DIAGNOSES   Final diagnoses:  COPD exacerbation (HCC)     Rx / DC Orders   ED Discharge Orders          Ordered    predniSONE  (DELTASONE ) 20 MG tablet  Daily with breakfast        08/18/24 1352    albuterol  (VENTOLIN  HFA) 108 (90 Base) MCG/ACT inhaler  Every 6 hours PRN       Note to Pharmacy: Please supply with spacer   08/18/24 1352    doxycycline  (VIBRAMYCIN ) 100 MG capsule  2 times daily        08/18/24 1352             Note:  This document was prepared using Dragon voice recognition software and may include unintentional dictation errors.   Willo Dunnings, MD 08/18/24 1353  "

## 2024-08-18 NOTE — ED Notes (Signed)
 See triage note  Presents via EMS from [facility]  he has had a cough for couple of weeks  Became worse over the past 3 days  Also having some chest discomfort with cough  Afebrile on arrival

## 2024-08-26 NOTE — Discharge Instructions (Signed)

## 2024-08-27 ENCOUNTER — Other Ambulatory Visit: Payer: Self-pay

## 2024-08-27 ENCOUNTER — Encounter: Admission: RE | Disposition: A | Payer: Self-pay | Source: Home / Self Care

## 2024-08-27 ENCOUNTER — Ambulatory Visit: Admission: RE | Admit: 2024-08-27 | Discharge: 2024-08-27 | Disposition: A | Discharge status: Home / Self Care

## 2024-08-27 ENCOUNTER — Ambulatory Visit

## 2024-08-27 ENCOUNTER — Encounter: Admission: RE | Payer: Self-pay

## 2024-08-27 DIAGNOSIS — I5042 Chronic combined systolic (congestive) and diastolic (congestive) heart failure: Secondary | ICD-10-CM | POA: Insufficient documentation

## 2024-08-27 DIAGNOSIS — Z87891 Personal history of nicotine dependence: Secondary | ICD-10-CM | POA: Insufficient documentation

## 2024-08-27 DIAGNOSIS — Z7984 Long term (current) use of oral hypoglycemic drugs: Secondary | ICD-10-CM | POA: Insufficient documentation

## 2024-08-27 DIAGNOSIS — N183 Chronic kidney disease, stage 3 unspecified: Secondary | ICD-10-CM | POA: Diagnosis not present

## 2024-08-27 DIAGNOSIS — E1122 Type 2 diabetes mellitus with diabetic chronic kidney disease: Secondary | ICD-10-CM | POA: Insufficient documentation

## 2024-08-27 DIAGNOSIS — E1140 Type 2 diabetes mellitus with diabetic neuropathy, unspecified: Secondary | ICD-10-CM

## 2024-08-27 DIAGNOSIS — I130 Hypertensive heart and chronic kidney disease with heart failure and stage 1 through stage 4 chronic kidney disease, or unspecified chronic kidney disease: Secondary | ICD-10-CM | POA: Diagnosis not present

## 2024-08-27 DIAGNOSIS — Z7985 Long-term (current) use of injectable non-insulin antidiabetic drugs: Secondary | ICD-10-CM | POA: Diagnosis not present

## 2024-08-27 DIAGNOSIS — H2512 Age-related nuclear cataract, left eye: Secondary | ICD-10-CM | POA: Diagnosis present

## 2024-08-27 DIAGNOSIS — E1136 Type 2 diabetes mellitus with diabetic cataract: Secondary | ICD-10-CM | POA: Insufficient documentation

## 2024-08-27 DIAGNOSIS — Z794 Long term (current) use of insulin: Secondary | ICD-10-CM | POA: Insufficient documentation

## 2024-08-27 HISTORY — PX: CATARACT EXTRACTION W/PHACO: SHX586

## 2024-08-27 LAB — GLUCOSE, CAPILLARY: Glucose-Capillary: 194 mg/dL — ABNORMAL HIGH (ref 70–99)

## 2024-08-27 SURGERY — PHACOEMULSIFICATION, CATARACT, WITH IOL INSERTION
Anesthesia: Topical | Laterality: Left

## 2024-08-27 MED ORDER — TETRACAINE HCL 0.5 % OP SOLN
OPHTHALMIC | Status: AC
  Filled 2024-08-27: qty 4

## 2024-08-27 MED ORDER — CYCLOPENTOLATE HCL 2 % OP SOLN
1.0000 [drp] | OPHTHALMIC | Status: DC | PRN
  Administered 2024-08-27 (×2): 1 [drp] via OPHTHALMIC

## 2024-08-27 MED ORDER — PHENYLEPHRINE HCL 10 % OP SOLN
1.0000 [drp] | OPHTHALMIC | Status: DC | PRN
  Administered 2024-08-27 (×3): 1 [drp] via OPHTHALMIC

## 2024-08-27 MED ORDER — MIDAZOLAM HCL (PF) 2 MG/2ML IJ SOLN
INTRAMUSCULAR | Status: DC | PRN
  Administered 2024-08-27 (×2): 1 mg via INTRAVENOUS

## 2024-08-27 MED ORDER — TETRACAINE HCL 0.5 % OP SOLN
1.0000 [drp] | OPHTHALMIC | Status: DC | PRN
  Administered 2024-08-27 (×3): 1 [drp] via OPHTHALMIC

## 2024-08-27 MED ORDER — DEXMEDETOMIDINE HCL IN NACL 400 MCG/100ML IV SOLN
INTRAVENOUS | Status: DC | PRN
  Administered 2024-08-27: 8 ug via INTRAVENOUS

## 2024-08-27 MED ORDER — BRIMONIDINE TARTRATE-TIMOLOL 0.2-0.5 % OP SOLN
OPHTHALMIC | Status: DC | PRN
  Administered 2024-08-27: 1 [drp] via OPHTHALMIC

## 2024-08-27 MED ORDER — PHENYLEPHRINE-KETOROLAC 1-0.3 % IO SOLN
INTRAOCULAR | Status: DC | PRN
  Administered 2024-08-27: 147 mL via OPHTHALMIC

## 2024-08-27 MED ORDER — SIGHTPATH DOSE#1 NA HYALUR & NA CHOND-NA HYALUR IO KIT
PACK | INTRAOCULAR | Status: DC | PRN
  Administered 2024-08-27: 1 via OPHTHALMIC

## 2024-08-27 MED ORDER — DIPHENHYDRAMINE HCL 50 MG/ML IJ SOLN
INTRAMUSCULAR | Status: DC | PRN
  Administered 2024-08-27: 25 mg via INTRAVENOUS

## 2024-08-27 MED ORDER — FENTANYL CITRATE (PF) 100 MCG/2ML IJ SOLN
INTRAMUSCULAR | Status: AC
  Filled 2024-08-27: qty 2

## 2024-08-27 MED ORDER — LIDOCAINE HCL (PF) 2 % IJ SOLN
INTRAOCULAR | Status: DC | PRN
  Administered 2024-08-27: 1 mL via INTRAOCULAR

## 2024-08-27 MED ORDER — MIDAZOLAM HCL 2 MG/2ML IJ SOLN
INTRAMUSCULAR | Status: AC
  Filled 2024-08-27: qty 2

## 2024-08-27 MED ORDER — LACTATED RINGERS IV SOLN: INTRAVENOUS | Status: DC

## 2024-08-27 MED ORDER — PROPOFOL 10 MG/ML IV BOLUS
INTRAVENOUS | Status: AC
  Filled 2024-08-27: qty 20

## 2024-08-27 MED ORDER — NA CHONDROIT SULF-NA HYALURON 40-30 MG/ML IO SOSY
INTRAOCULAR | Status: DC | PRN
  Administered 2024-08-27: .5 mL via INTRAOCULAR

## 2024-08-27 MED ORDER — PHENYLEPHRINE HCL 10 % OP SOLN
OPHTHALMIC | Status: AC
  Filled 2024-08-27: qty 5

## 2024-08-27 MED ORDER — MOXIFLOXACIN HCL 0.5 % OP SOLN
OPHTHALMIC | Status: DC | PRN
  Administered 2024-08-27: .2 mL via OPHTHALMIC

## 2024-08-27 MED ORDER — DEXMEDETOMIDINE HCL IN NACL 80 MCG/20ML IV SOLN
INTRAVENOUS | Status: AC
  Filled 2024-08-27: qty 20

## 2024-08-27 MED ORDER — SIGHTPATH DOSE#1 BSS IO SOLN
INTRAOCULAR | Status: DC | PRN
  Administered 2024-08-27: 15 mL via INTRAOCULAR

## 2024-08-27 MED ORDER — CYCLOPENTOLATE HCL 2 % OP SOLN
OPHTHALMIC | Status: AC
  Filled 2024-08-27: qty 2

## 2024-08-27 MED ORDER — FENTANYL CITRATE (PF) 100 MCG/2ML IJ SOLN
INTRAMUSCULAR | Status: DC | PRN
  Administered 2024-08-27 (×2): 50 ug via INTRAVENOUS

## 2024-08-27 NOTE — H&P (Signed)
 [facility]   Primary Care Physician:  Rilla Baller, MD Ophthalmologist: Dr. Curtistine Fava  Pre-Procedure History & Physical: HPI:  Danny Hemmelgarn Nudd Sr. is a 74 y.o. male here for cataract surgery.   Past Medical History:  Diagnosis Date   Amputation of lower extremity (HCC)    left   Anxiety and depression    Atherosclerosis of native artery of right lower extremity with intermittent claudication    CAD (coronary artery disease)    a. 01/2010 PCI of LAD; b. 09/2014 PCI/DES of mLAD due to ISR. D1 80, D2 80(jailed), LCX 66m; c. 07/2016 NSTEMI/Cath: LM nl, LAD 75m ISR, 40d, D1 90ost, 80p, D2 90ost, RI min irregs, LCX min irregs, OM1 90 small, OM2/3 min irregs, RCA min irregs, RPLB1 90, EF 25-35%.   Cellulitis of left foot 11/09/2020   Chronic combined systolic and diastolic CHF (congestive heart failure) (HCC)    a. 07/2016 Echo: EF 30%, severe septal/anterior HK, Gr1 DD.   CKD (chronic kidney disease), stage III (HCC)    CKD stage 3 due to type 2 diabetes mellitus (HCC)    COPD (chronic obstructive pulmonary disease) (HCC)    Diabetic polyneuropathy associated with type 2 diabetes mellitus (HCC)    DM2 (diabetes mellitus, type 2) (HCC)    Erectile dysfunction    GERD (gastroesophageal reflux disease)    HLD (hyperlipidemia)    HTN (hypertension)    Hx of left BKA (HCC)    Intracranial hemorrhage (HCC)    Ischemic cardiomyopathy    a. 07/2016 Echo: EF 30% w/ sev septal/ant HK. Gr1 DD.   Neuromuscular disorder (HCC)    neuropathy   Peripheral vascular disease    Phantom pain after amputation of lower extremity [facility])     Past Surgical History:  Procedure Laterality Date   AMPUTATION Left 11/10/2020   Procedure: AMPUTATION BELOW KNEE;  Surgeon: Marea Selinda RAMAN, MD;  Location: [facility] OR[facility];  Service: General;  Laterality: Left;   ANTERIOR VITRECTOMY Right 06/17/2024   Procedure: VITRECTOMY, ANTERIOR;  Surgeon: Mittie Gaskin, MD;  Location: [facility];  Service:  Ophthalmology;  Laterality: Right;   BELOW KNEE LEG AMPUTATION     CAD: stent to the LAD     CARDIAC CATHETERIZATION  10/01/2014   CARDIAC CATHETERIZATION N/A 07/23/2016   Procedure: Left Heart Cath and Coronary Angiography;  Surgeon: Deatrice DELENA Cage, MD;  Location: [facility];  Service: Cardiovascular;  Laterality: N/A;   CATARACT EXTRACTION W/PHACO Right 06/17/2024   Procedure: PHACOEMULSIFICATION, CATARACT, WITH IOL INSERTION 42.24 03:09.2;  Surgeon: Mittie Gaskin, MD;  Location: [facility];  Service: Ophthalmology;  Laterality: Right;   CORONARY ANGIOPLASTY WITH STENT PLACEMENT  10/01/2014   LOWER EXTREMITY ANGIOGRAPHY Left 10/31/2020   Procedure: LOWER EXTREMITY ANGIOGRAPHY;  Surgeon: Marea Selinda RAMAN, MD;  Location: [facility];  Service: Cardiovascular;  Laterality: Left;   LOWER EXTREMITY ANGIOGRAPHY Right 11/20/2021   Procedure: Lower Extremity Angiography;  Surgeon: Marea Selinda RAMAN, MD;  Location: [facility];  Service: Cardiovascular;  Laterality: Right;   LOWER EXTREMITY ANGIOGRAPHY Right 02/27/2024   Procedure: Lower Extremity Angiography;  Surgeon: Marea Selinda RAMAN, MD;  Location: [facility];  Service: Cardiovascular;  Laterality: Right;    Prior to Admission medications  Medication Sig Start Date End Date Taking? Authorizing Provider  acetaminophen  (TYLENOL ) 325 MG tablet Take 2 tablets (650 mg total) by mouth every 4 (four) hours as needed for mild pain (or temp >  37.5 C (99.5 F)). 10/10/21  Yes Raenelle Coria, MD  albuterol  (PROVENTIL ) (2.5 MG/3ML) 0.083% nebulizer solution Take 3 mLs (2.5 mg total) by nebulization every 6 (six) hours as needed for wheezing or shortness of breath. 09/30/20  Yes Maribeth Camellia MATSU, MD  albuterol  (VENTOLIN  HFA) 108 (90 Base) MCG/ACT inhaler Inhale 2 puffs into the lungs every 6 (six) hours as needed for wheezing or shortness of breath. 08/18/24  Yes Willo Dunnings, MD  amLODipine  (NORVASC ) 10 MG tablet TAKE 1  TABLET BY MOUTH ONCE DAILY 01/28/24  Yes Rilla Baller, MD  aspirin  EC 81 MG tablet Take 1 tablet (81 mg total) by mouth daily. 02/25/23  Yes Maribeth Camellia MATSU, MD  atorvastatin  (LIPITOR ) 80 MG tablet Take 1 tablet (80 mg total) by mouth daily. 01/27/24  Yes Rilla Baller, MD  carvedilol  (COREG ) 25 MG tablet Take 1 tablet (25 mg total) by mouth 2 (two) times daily with a meal. 01/27/24  Yes Rilla Baller, MD  clopidogrel  (PLAVIX ) 75 MG tablet TAKE 1 TABLET BY MOUTH ONCE DAILY 12/18/23  Yes Dew, Selinda RAMAN, MD  Continuous Glucose Receiver (FREESTYLE LIBRE 2 READER) DEVI 1 Device by Does not apply route 4 (four) times daily. 05/14/23  Yes Maribeth Camellia MATSU, MD  Continuous Glucose Sensor (FREESTYLE LIBRE 2 SENSOR) MISC Check blood sugars 03/25/23  Yes Maribeth Camellia MATSU, MD  escitalopram  (LEXAPRO ) 10 MG tablet Take 1 tablet (10 mg total) by mouth daily. 01/27/24  Yes Rilla Baller, MD  Insulin  Pen Needle (ULTRACARE PEN NEEDLES) 32G X 6 MM MISC USE AS DIRECTED. TWICE DAILY 06/21/23  Yes Maribeth Camellia MATSU, MD  spironolactone  (ALDACTONE ) 25 MG tablet Take 1 tablet (25 mg total) by mouth daily. 01/27/24  Yes Rilla Baller, MD  tamsulosin  (FLOMAX ) 0.4 MG CAPS capsule TAKE 1 CAPSULE BY MOUTH ONCE DAILY 01/28/24  Yes Rilla Baller, MD  tirzepatide  (MOUNJARO ) 7.5 MG/0.5ML Pen Inject 7.5 mg into the skin once a week.   Yes [provider]  empagliflozin  (JARDIANCE ) 25 MG TABS tablet Take 1 tablet (25 mg total) by mouth daily. Patient not taking: Reported on 08/24/2024 01/27/24   Rilla Baller, MD  furosemide  (LASIX ) 20 MG tablet Take 1 tablet (20 mg total) by mouth daily as needed for edema. 12/27/23 03/26/24  Lorene Lesley CROME, PA-C  nitroGLYCERIN  (NITROSTAT ) 0.4 MG SL tablet Place 0.4 mg under the tongue every 5 (five) minutes as needed for chest pain.    [provider]  umeclidinium-vilanterol (ANORO ELLIPTA ) 62.5-25 MCG/ACT AEPB Inhale 1 puff into the lungs daily. Patient  taking differently: Inhale 1 puff into the lungs daily. Per pt, takes prn 01/27/24   Rilla Baller, MD    Allergies as of 07/23/2024 - Review Complete 06/17/2024  Allergen Reaction Noted   Metformin  and related Diarrhea 11/20/2021    Family History  Problem Relation Age of Onset   Heart attack Father        complications   Cancer Brother     Social History   Socioeconomic History   Marital status: Divorced    Spouse name: Not on file   Number of children: Not on file   Years of education: Not on file   Highest education level: Not on file  Occupational History   Not on file  Tobacco Use   Smoking status: Former    Current packs/day: 0.00    Average packs/day: 3.0 packs/day    Types: Cigarettes    Start date: 09/07/2009    Quit date:  09/07/2009    Years since quitting: 14.9   Smokeless tobacco: Never   Tobacco comments:    quit june 2011  Vaping Use   Vaping status: Never Used  Substance and Sexual Activity   Alcohol use: Not Currently   Drug use: Never   Sexual activity: Not Currently  Other Topics Concern   Not on file  Social History Narrative   divorced   Social Drivers of Health   Tobacco Use: Medium Risk (08/27/2024)   Patient History    Smoking Tobacco Use: Former    Smokeless Tobacco Use: Never    Passive Exposure: Not on file  Financial Resource Strain: Low Risk (04/24/2023)   Overall Financial Resource Strain (CARDIA)    Difficulty of Paying Living Expenses: Not hard at all  Food Insecurity: Patient Declined (02/27/2024)   Epic    Worried About Programme Researcher, Broadcasting/film/video in the Last Year: Patient declined    Barista in the Last Year: Patient declined  Transportation Needs: Patient Declined (02/27/2024)   Epic    Lack of Transportation (Medical): Patient declined    Lack of Transportation (Non-Medical): Patient declined  Physical Activity: Inactive (04/24/2023)   Exercise Vital Sign    Days of Exercise per Week: 0 days    Minutes of Exercise per  Session: 0 min  Stress: Stress Concern Present (04/24/2023)   [employer] of Occupational Health - Occupational Stress Questionnaire    Feeling of Stress : To some extent  Social Connections: Unknown (02/27/2024)   Social Connection and Isolation Panel    Frequency of Communication with Friends and Family: Patient declined    Frequency of Social Gatherings with Friends and Family: Patient declined    Attends Religious Services: Patient declined    Active Member of Clubs or Organizations: Patient declined    Attends Banker Meetings: Patient declined    Marital Status: Divorced  Intimate Partner Violence: Patient Declined (02/27/2024)   Epic    Fear of Current or Ex-Partner: Patient declined    Emotionally Abused: Patient declined    Physically Abused: Patient declined    Sexually Abused: Patient declined  Depression (PHQ2-9): Low Risk (01/27/2024)   Depression (PHQ2-9)    PHQ-2 Score: 1  Alcohol Screen: Low Risk (04/24/2023)   Alcohol Screen    Last Alcohol Screening Score (AUDIT): 0  Housing: Unknown (05/04/2024)   Received from [facility]   Epic    Unable to Pay for Housing in the Last Year: Not on file    Number of Times Moved in the Last Year: Not on file    At any time in the past 12 months, were you homeless or living in a shelter (including now)?: No  Utilities: Patient Declined (02/27/2024)   Epic    Threatened with loss of utilities: Patient declined  Health Literacy: Inadequate Health Literacy (07/25/2023)   B1300 Health Literacy    Frequency of need for help with medical instructions: Always    Review of Systems: See HPI, otherwise negative ROS  Physical Exam: BP (!) 162/89   Pulse 93   Temp (!) 97.5 F (36.4 C) (Temporal)   Resp (!) 22   Ht 6' (1.829 m)   Wt 97 kg   SpO2 96%   BMI 29.00 kg/m  General:   Alert, cooperative in NAD Head:  Normocephalic and atraumatic. Respiratory:  Normal work of breathing. Cardiovascular:   RRR  Impression/Plan: Danny HERO Munch Sr. is here for  cataract surgery LEFT EYE.  Risks, benefits, limitations, and alternatives regarding cataract surgery have been reviewed with the patient.  Questions have been answered.  All parties agreeable.   Curtistine JINNY Fava, MD  08/27/2024, 8:29 AM

## 2024-08-27 NOTE — Anesthesia Postprocedure Evaluation (Signed)
"   Anesthesia Post Note  Patient: Danny Spivack Sollers Sr.  Procedure(s) Performed: PHACOEMULSIFICATION, CATARACT, WITH IOL INSERTION 5.03, 00:51.0 (Left)  Patient location during evaluation: PACU Anesthesia Type: MAC Level of consciousness: awake and alert Pain management: pain level controlled Vital Signs Assessment: post-procedure vital signs reviewed and stable Respiratory status: spontaneous breathing, nonlabored ventilation, respiratory function stable and patient connected to nasal cannula oxygen  Cardiovascular status: blood pressure returned to baseline and stable Postop Assessment: no apparent nausea or vomiting Anesthetic complications: no   No notable events documented.   Last Vitals:  Vitals:   08/27/24 0937 08/27/24 0945  BP:  124/80  Pulse: 87 93  Resp: 17 18  Temp: 36.8 C   SpO2: 94% 96%    Last Pain:  Vitals:   08/27/24 0945  TempSrc:   PainSc: 0-No pain                 Fairy A Kamaiya Antilla      "

## 2024-08-27 NOTE — Anesthesia Preprocedure Evaluation (Signed)
"                                    Anesthesia Evaluation  Patient identified by MRN, date of birth, ID band Patient awake    Reviewed: Allergy & Precautions, H&P , NPO status , Patient's Chart, lab work & pertinent test results  Airway Mallampati: II  TM Distance: >3 FB Neck ROM: Full    Dental no notable dental hx. (+) Edentulous Upper, Edentulous Lower   Pulmonary neg pulmonary ROS, former smoker   Pulmonary exam normal breath sounds clear to auscultation       Cardiovascular hypertension, negative cardio ROS Normal cardiovascular exam Rhythm:Regular Rate:Normal     Neuro/Psych negative neurological ROS  negative psych ROS   GI/Hepatic negative GI ROS, Neg liver ROS,,,  Endo/Other  negative endocrine ROSdiabetes    Renal/GU negative Renal ROS  negative genitourinary   Musculoskeletal negative musculoskeletal ROS (+)    Abdominal   Peds negative pediatric ROS (+)  Hematology negative hematology ROS (+)   Anesthesia Other Findings   Reproductive/Obstetrics negative OB ROS                              Anesthesia Physical Anesthesia Plan  ASA: 3  Anesthesia Plan: MAC   Post-op Pain Management:    Induction: Intravenous  PONV Risk Score and Plan:   Airway Management Planned:   Additional Equipment:   Intra-op Plan:   Post-operative Plan: Extubation in OR  Informed Consent: I have reviewed the patients History and Physical, chart, labs and discussed the procedure including the risks, benefits and alternatives for the proposed anesthesia with the patient or authorized representative who has indicated his/her understanding and acceptance.     Dental advisory given  Plan Discussed with: CRNA  Anesthesia Plan Comments:         Anesthesia Quick Evaluation  "

## 2024-08-27 NOTE — Op Note (Signed)
 PREOPERATIVE DIAGNOSIS:  Nuclear sclerotic cataract of the left eye.   POSTOPERATIVE DIAGNOSIS:  Nuclear sclerotic cataract of the left eye.   OPERATIVE PROCEDURE: Phacoemulsification with IOL Implant Left Eye   SURGEON:  Curtistine Fava, MD   ANESTHESIA:  Anesthesiologist: Maggioncalda, Fairy LABOR, MD CRNA: Pearl Ozell PARAS, CRNA  1.      Managed anesthesia care. 2.     0.50ml of epi-Shugarcaine was instilled following the paracentesis   COMPLICATIONS:  None.   TECHNIQUE:   Phacoemulsification divide and conquer   DESCRIPTION OF PROCEDURE:  The patient was examined and consented in the preoperative holding area where the aforementioned topical anesthesia was applied to the left eye and then brought back to the Operating Room where the left eye was prepped and draped in the usual sterile ophthalmic fashion and a lid speculum was placed. A paracentesis was created with the side port blade and the anterior chamber was filled with epi-Shugarcaine follower by viscoelastic then a 7.79mm malyugin ring. A clear corneal incision was performed with the steel keratome. A continuous curvilinear capsulorrhexis was performed with a cystotome followed by the capsulorrhexis forceps. Hydrodissection and hydrodelineation were carried out with BSS on a blunt cannula. The lens was removed in a divide and conquer technique and the remaining cortical material was removed with the irrigation-aspiration handpiece. The capsular bag was inflated with viscoelastic and the [id number] +20.50D lens was placed in the capsular bag without complication. The malyugin ring was removed. The remaining viscoelastic was removed from the eye with the irrigation-aspiration handpiece. The wounds were hydrated. The anterior chamber was flushed with BSS and the eye was inflated to physiologic pressure. 0.1ml of Vigamox  was placed in the anterior chamber. The wounds were found to be water tight. The eye was dressed with Combigan  and covered with a  clear shield to be worn until the first postoperative day appointment. The patient was given protective glasses to wear throughout the day. The patient was also given drops with which to begin a drop regimen today and will follow-up with me in one day. There were no complications, but the patient tolerated the procedure poorly and there was significant movement throughout the procedure. Implant Name Type Inv. Item Serial No. Manufacturer Lot No. LRB No. Used Action  LENS IOL CLRN CLEAR 20.5 - [id number] Intraocular Lens LENS IOL CLRN CLEAR 20.5 [id number] [employer]  Left 1 Implanted    Procedures: PHACOEMULSIFICATION, CATARACT, WITH IOL INSERTION 5.03, 00:51.0 (Left)  Electronically signed: Curtistine PARAS [facility] 08/27/2024 9:30 AM

## 2024-08-27 NOTE — Transfer of Care (Signed)
 Immediate Anesthesia Transfer of Care Note  Patient: Danny Schroll Taitt Sr.  Procedure(s) Performed: PHACOEMULSIFICATION, CATARACT, WITH IOL INSERTION 5.03, 00:51.0 (Left)  Patient Location: PACU  Anesthesia Type: MAC  Level of Consciousness: awake, alert  and patient cooperative  Airway and Oxygen  Therapy: Patient Spontanous Breathing and Patient connected to supplemental oxygen   Post-op Assessment: Post-op Vital signs reviewed, Patient's Cardiovascular Status Stable, Respiratory Function Stable, Patent Airway and No signs of Nausea or vomiting  Post-op Vital Signs: Reviewed and stable  Complications: No notable events documented.
